# Patient Record
Sex: Female | Born: 1965
Health system: Southern US, Community
[De-identification: ages and names within clinical notes are randomized; demographics above are authoritative.]

## PROBLEM LIST (undated history)

## (undated) ENCOUNTER — Emergency Department (HOSPITAL_COMMUNITY): Admission: EM | Payer: 59

## (undated) DIAGNOSIS — F419 Anxiety disorder, unspecified: Secondary | ICD-10-CM

## (undated) DIAGNOSIS — F329 Major depressive disorder, single episode, unspecified: Secondary | ICD-10-CM

## (undated) DIAGNOSIS — C787 Secondary malignant neoplasm of liver and intrahepatic bile duct: Secondary | ICD-10-CM

## (undated) DIAGNOSIS — E890 Postprocedural hypothyroidism: Secondary | ICD-10-CM

## (undated) DIAGNOSIS — N393 Stress incontinence (female) (male): Secondary | ICD-10-CM

## (undated) DIAGNOSIS — R918 Other nonspecific abnormal finding of lung field: Secondary | ICD-10-CM

## (undated) DIAGNOSIS — R112 Nausea with vomiting, unspecified: Secondary | ICD-10-CM

## (undated) DIAGNOSIS — G8929 Other chronic pain: Secondary | ICD-10-CM

## (undated) DIAGNOSIS — Z8639 Personal history of other endocrine, nutritional and metabolic disease: Secondary | ICD-10-CM

## (undated) DIAGNOSIS — C50211 Malignant neoplasm of upper-inner quadrant of right female breast: Secondary | ICD-10-CM

## (undated) DIAGNOSIS — R519 Headache, unspecified: Secondary | ICD-10-CM

## (undated) DIAGNOSIS — Z972 Presence of dental prosthetic device (complete) (partial): Secondary | ICD-10-CM

## (undated) DIAGNOSIS — Z923 Personal history of irradiation: Secondary | ICD-10-CM

## (undated) DIAGNOSIS — K5900 Constipation, unspecified: Secondary | ICD-10-CM

## (undated) DIAGNOSIS — E785 Hyperlipidemia, unspecified: Secondary | ICD-10-CM

## (undated) DIAGNOSIS — C7951 Secondary malignant neoplasm of bone: Secondary | ICD-10-CM

## (undated) DIAGNOSIS — M549 Dorsalgia, unspecified: Secondary | ICD-10-CM

## (undated) DIAGNOSIS — R51 Headache: Secondary | ICD-10-CM

## (undated) DIAGNOSIS — R011 Cardiac murmur, unspecified: Secondary | ICD-10-CM

## (undated) DIAGNOSIS — N920 Excessive and frequent menstruation with regular cycle: Secondary | ICD-10-CM

## (undated) DIAGNOSIS — I10 Essential (primary) hypertension: Secondary | ICD-10-CM

## (undated) DIAGNOSIS — M1990 Unspecified osteoarthritis, unspecified site: Secondary | ICD-10-CM

## (undated) DIAGNOSIS — F32A Depression, unspecified: Secondary | ICD-10-CM

## (undated) DIAGNOSIS — Z8489 Family history of other specified conditions: Secondary | ICD-10-CM

## (undated) DIAGNOSIS — Z973 Presence of spectacles and contact lenses: Secondary | ICD-10-CM

## (undated) DIAGNOSIS — Z9889 Other specified postprocedural states: Secondary | ICD-10-CM

## (undated) HISTORY — PX: COLONOSCOPY: SHX174

## (undated) HISTORY — PX: MASTECTOMY: SHX3

## (undated) HISTORY — DX: Cardiac murmur, unspecified: R01.1

## (undated) HISTORY — DX: Essential (primary) hypertension: I10

## (undated) HISTORY — PX: OTHER SURGICAL HISTORY: SHX169

## (undated) HISTORY — DX: Anxiety disorder, unspecified: F41.9

## (undated) HISTORY — DX: Malignant neoplasm of upper-inner quadrant of right female breast: C50.211

## (undated) HISTORY — DX: Excessive and frequent menstruation with regular cycle: N92.0

---

## 1993-05-11 HISTORY — PX: TUBAL LIGATION: SHX77

## 1998-08-28 ENCOUNTER — Other Ambulatory Visit: Admission: RE | Admit: 1998-08-28 | Discharge: 1998-08-28 | Payer: Self-pay | Admitting: Obstetrics & Gynecology

## 1999-09-22 ENCOUNTER — Other Ambulatory Visit: Admission: RE | Admit: 1999-09-22 | Discharge: 1999-09-22 | Payer: Self-pay | Admitting: Obstetrics and Gynecology

## 2000-08-27 ENCOUNTER — Other Ambulatory Visit: Admission: RE | Admit: 2000-08-27 | Discharge: 2000-08-27 | Payer: Self-pay | Admitting: Obstetrics and Gynecology

## 2002-01-20 ENCOUNTER — Other Ambulatory Visit: Admission: RE | Admit: 2002-01-20 | Discharge: 2002-01-20 | Payer: Self-pay | Admitting: Obstetrics and Gynecology

## 2002-02-21 ENCOUNTER — Ambulatory Visit (HOSPITAL_COMMUNITY): Admission: RE | Admit: 2002-02-21 | Discharge: 2002-02-21 | Payer: Self-pay | Admitting: Family Medicine

## 2002-02-21 ENCOUNTER — Encounter: Payer: Self-pay | Admitting: Family Medicine

## 2002-03-03 ENCOUNTER — Encounter: Admission: RE | Admit: 2002-03-03 | Discharge: 2002-03-03 | Payer: Self-pay | Admitting: Family Medicine

## 2002-03-03 ENCOUNTER — Encounter: Payer: Self-pay | Admitting: Family Medicine

## 2002-07-25 ENCOUNTER — Encounter: Payer: Self-pay | Admitting: Endocrinology

## 2002-07-25 ENCOUNTER — Ambulatory Visit (HOSPITAL_COMMUNITY): Admission: RE | Admit: 2002-07-25 | Discharge: 2002-07-25 | Payer: Self-pay | Admitting: Endocrinology

## 2002-08-17 ENCOUNTER — Encounter: Payer: Self-pay | Admitting: Endocrinology

## 2002-08-17 ENCOUNTER — Ambulatory Visit (HOSPITAL_COMMUNITY): Admission: RE | Admit: 2002-08-17 | Discharge: 2002-08-17 | Payer: Self-pay | Admitting: Endocrinology

## 2003-02-21 ENCOUNTER — Other Ambulatory Visit: Admission: RE | Admit: 2003-02-21 | Discharge: 2003-02-21 | Payer: Self-pay | Admitting: Obstetrics and Gynecology

## 2004-04-01 ENCOUNTER — Other Ambulatory Visit: Admission: RE | Admit: 2004-04-01 | Discharge: 2004-04-01 | Payer: Self-pay | Admitting: Obstetrics and Gynecology

## 2004-04-25 ENCOUNTER — Ambulatory Visit (HOSPITAL_COMMUNITY): Admission: RE | Admit: 2004-04-25 | Discharge: 2004-04-25 | Payer: Self-pay | Admitting: Obstetrics and Gynecology

## 2004-07-16 ENCOUNTER — Observation Stay (HOSPITAL_COMMUNITY): Admission: EM | Admit: 2004-07-16 | Discharge: 2004-07-17 | Payer: Self-pay | Admitting: Family Medicine

## 2005-04-08 ENCOUNTER — Other Ambulatory Visit: Admission: RE | Admit: 2005-04-08 | Discharge: 2005-04-08 | Payer: Self-pay | Admitting: Obstetrics and Gynecology

## 2005-04-29 ENCOUNTER — Ambulatory Visit (HOSPITAL_COMMUNITY): Admission: RE | Admit: 2005-04-29 | Discharge: 2005-04-29 | Payer: Self-pay | Admitting: Obstetrics and Gynecology

## 2006-04-30 ENCOUNTER — Ambulatory Visit (HOSPITAL_COMMUNITY): Admission: RE | Admit: 2006-04-30 | Discharge: 2006-04-30 | Payer: Self-pay | Admitting: Obstetrics and Gynecology

## 2006-08-14 ENCOUNTER — Emergency Department (HOSPITAL_COMMUNITY): Admission: EM | Admit: 2006-08-14 | Discharge: 2006-08-14 | Payer: Self-pay | Admitting: Emergency Medicine

## 2007-05-03 ENCOUNTER — Ambulatory Visit (HOSPITAL_COMMUNITY): Admission: RE | Admit: 2007-05-03 | Discharge: 2007-05-03 | Payer: Self-pay | Admitting: Obstetrics and Gynecology

## 2007-05-18 ENCOUNTER — Encounter: Admission: RE | Admit: 2007-05-18 | Discharge: 2007-05-18 | Payer: Self-pay | Admitting: Obstetrics and Gynecology

## 2008-05-08 ENCOUNTER — Ambulatory Visit (HOSPITAL_COMMUNITY): Admission: RE | Admit: 2008-05-08 | Discharge: 2008-05-08 | Payer: Self-pay | Admitting: Obstetrics and Gynecology

## 2009-05-09 ENCOUNTER — Ambulatory Visit (HOSPITAL_COMMUNITY): Admission: RE | Admit: 2009-05-09 | Discharge: 2009-05-09 | Payer: Self-pay | Admitting: Obstetrics and Gynecology

## 2009-05-11 DIAGNOSIS — N920 Excessive and frequent menstruation with regular cycle: Secondary | ICD-10-CM

## 2009-05-11 HISTORY — DX: Excessive and frequent menstruation with regular cycle: N92.0

## 2010-05-14 ENCOUNTER — Ambulatory Visit (HOSPITAL_COMMUNITY)
Admission: RE | Admit: 2010-05-14 | Discharge: 2010-05-14 | Payer: Self-pay | Source: Home / Self Care | Attending: Obstetrics and Gynecology | Admitting: Obstetrics and Gynecology

## 2010-09-26 NOTE — Discharge Summary (Signed)
Debbie [name], Debbie [name]           ACCOUNT NO.:  [id number]   MEDICAL RECORD NO.:  [id number]          PATIENT TYPE:  INP   LOCATION:  6743                         FACILITY:  [facility]   PHYSICIAN:  Corinna L. Lendell Caprice, MDDATE OF BIRTH:  29-May-1965   DATE OF ADMISSION:  07/16/2004  DATE OF DISCHARGE:  07/17/2004                                 DISCHARGE SUMMARY   DIAGNOSES:  1.  Palpitations. Suspect due to Synthroid. Recommend decreasing the dose,      but will have the patient follow up with Dr. Talmage Nap.  2.  History of thyroid radioablation.   DISCHARGE MEDICATIONS:  She is to continue her current Synthroid dose and  follow up with Dr. Talmage Nap to consider decreasing.   ACTIVITY:  She may return to work in two days.   CONDITION:  Stable.   DIET:  Regular.   CONSULTATIONS:  None.   PERTINENT LABORATORY DATA:  7.362 pH, pCO2 47. H&H, BMET normal. Two point  of care enzymes normal. TSH 5.088.   SPECIAL STUDIES AND RADIOLOGY:  EKG showed normal sinus rhythm with a rate  of 90. Chest x-ray negative.   HISTORY AND HOSPITAL COURSE:  Debbie [name] is a pleasant 45 year old black  female who is on Synthroid after having radioablation of her thyroid two  years ago. She reports that her Synthroid dose had been too high and it was  decreased about a month ago. She presented with palpitations, severe  anxiety, diaphoresis. She felt similar to previous episodes in the past when  she was on too high a dose of Synthroid; however, this was a bit worse. She  had this sensation while on the monitor several times but remained in normal  sinus rhythm. Despite her relatively high  TSH, her symptoms are consistent with a hyperthyroid state. I recommend  decreasing this thyroid dose but patient wishes to follow up with Dr. Talmage Nap  rather than making a change. I will have a call in to Dr. Talmage Nap and I  encourage patient to follow up with her as soon as possible.      CLS/MEDQ  D:  07/17/2004  T:   07/17/2004  Job:  [id number]   cc:   Gretta Arab. Valentina Lucks, M.D.  [address] [place]  [place] [address]  Fax: [phone]   Dorisann Frames, M.D.  [phone], [place] [address]  Fax: [phone]

## 2010-09-26 NOTE — H&P (Signed)
Debbie [name], Debbie [name]           ACCOUNT NO.:  [id number]   MEDICAL RECORD NO.:  [id number]          PATIENT TYPE:  EMS   LOCATION:  MAJO                         FACILITY:  [facility]   PHYSICIAN:  Hollice Espy, M.D.DATE OF BIRTH:  Dec 06, 1965   DATE OF ADMISSION:  07/16/2004  DATE OF DISCHARGE:                                HISTORY & PHYSICAL   PRIMARY CARE PHYSICIAN:  Gretta Arab. Valentina Lucks, M.D.   CHIEF COMPLAINT:  Palpitations.   HISTORY OF PRESENT ILLNESS:  The patient is a 45 year old African-American  female with a past medical history of hypothyroidism diagnosed and started  on medication approximately 2 months ago, as well as dyslipidemia felt to be  secondary to the thyroid, who presents with an episode of chest discomfort.  In discussion with the patient, it is actually described as sudden  palpitations in her chest.  The patient began to feel this fluttery  sensation in her chest earlier on in the evening.  It continued to the point  where she began to feel very lightheaded.  She felt as if she was going to  pass out.  Her husband became concerned and brought the patient to the  emergency room.  There, the patient was noted to be sinus tachycardic with  heart rate only as high as 110.  An EKG was done, which actually showed  normal sinus rhythm, some minimal right atrial enlargement, and a heart rate  of 84 to 91.  Lab work was unremarkable.  Cardiac enzymes, first set, were  negative.  A chest x-ray was done, which was also normal.  The patient,  herself, said she was short of breath, but she felt that maybe this was  secondary to the anxiety caused by the feeling of quick heart beat.  She  denied any chest pain or chest pressure or previous shortness of breath  prior to coming in.  Currently, the patient says she is doing well.  She  denies any headaches, visual changes, dysphagia.  She denies any current  chest pain, chest pressure, wheezing, coughing, abdominal pain,  hematuria,  dysuria, constipation, diarrhea.  Review of systems is otherwise negative.   PAST MEDICAL HISTORY:  1.  Hypothyroidism.  2.  Seasonal rhinitis.  3.  History of dyslipidemia.   MEDICATIONS:  1.  Synthroid 88 mcg p.o. daily.  2.  Allegra p.r.n., which she has not taken for a while.   ALLERGIES:  No known drug allergies.   SOCIAL HISTORY:  She denies any tobacco, alcohol, or drug use.   FAMILY HISTORY:  Notable for mom with congestive heart failure and MI.   PHYSICAL EXAMINATION:  VITAL SIGNS:  The patient's vitals on admission  revealed a heart rate of 102 (it is sinus), respirations 16.  She is  afebrile at 97.2.  Blood pressure 134/91.  GENERAL:  The patient is alert and oriented x3, in no apparent distress.  HEENT:  Normocephalic and atraumatic.  Mucous membranes are moist.  She has  no carotid bruits.  HEART:  Regular rhythm.  Mild tachycardia.  LUNGS:  Clear to auscultation bilaterally.  ABDOMEN:  Soft,  nontender, nondistended.  Positive bowel sounds.  EXTREMITIES:  No clubbing, cyanosis, or edema.   LABORATORY WORK:  Sodium 138, potassium 4.2, chloride 106, bicarb 27, BUN 6,  glucose 106.  Hemoglobin and hematocrit of 14.6 and 43.  Creatinine 0.9.  CPK 62.5, MB less than 1, troponin I less than 0.05.   ASSESSMENT AND PLAN:  1.  Palpitations.  I do not feel that this is chest pressure or discomfort      in the sense of a rule out myocardial infarction.  Will plan to observe      this patient and check a few more sets of cardiac enzymes.  Will go      ahead and check a TSH, as well, as I suspect this may be the issue.      Will give her 2 liters of IV fluids, in the morning to recheck      orthostatics and an EKG, as well as one more set of cardiac enzymes in      the morning.  If she is well, she may be able to go home, and at her      discretion, as well as Dr. Jone Baseman, she may want to get an outpatient      stress test.  If her heart rate remains sinus  tachycardic, and her      thyroid level is elevated, would recommend increasing her thyroid dose      perhaps, and starting her on a beta blocker.      SKK/MEDQ  D:  07/16/2004  T:  07/17/2004  Job:  [id number]   cc:   Gretta Arab. Valentina Lucks, M.D.  [address] [place]  [place] [address]  Fax: [phone]

## 2011-04-21 ENCOUNTER — Other Ambulatory Visit (HOSPITAL_COMMUNITY): Payer: Self-pay | Admitting: Obstetrics and Gynecology

## 2011-04-21 DIAGNOSIS — Z1231 Encounter for screening mammogram for malignant neoplasm of breast: Secondary | ICD-10-CM

## 2011-05-26 ENCOUNTER — Ambulatory Visit (HOSPITAL_COMMUNITY)
Admission: RE | Admit: 2011-05-26 | Discharge: 2011-05-26 | Disposition: A | Discharge status: Home / Self Care | Payer: 59 | Source: Ambulatory Visit | Attending: Obstetrics and Gynecology | Admitting: Obstetrics and Gynecology

## 2011-05-26 DIAGNOSIS — Z1231 Encounter for screening mammogram for malignant neoplasm of breast: Secondary | ICD-10-CM | POA: Insufficient documentation

## 2011-06-03 ENCOUNTER — Other Ambulatory Visit: Payer: Self-pay | Admitting: Obstetrics and Gynecology

## 2011-06-03 DIAGNOSIS — R928 Other abnormal and inconclusive findings on diagnostic imaging of breast: Secondary | ICD-10-CM

## 2011-06-10 ENCOUNTER — Ambulatory Visit
Admission: RE | Admit: 2011-06-10 | Discharge: 2011-06-10 | Disposition: A | Discharge status: Home / Self Care | Payer: 59 | Source: Ambulatory Visit | Attending: Obstetrics and Gynecology | Admitting: Obstetrics and Gynecology

## 2011-06-10 DIAGNOSIS — R928 Other abnormal and inconclusive findings on diagnostic imaging of breast: Secondary | ICD-10-CM

## 2011-12-28 ENCOUNTER — Encounter: Payer: 59 | Attending: Family Medicine | Admitting: *Deleted

## 2011-12-28 ENCOUNTER — Encounter: Payer: Self-pay | Admitting: *Deleted

## 2011-12-28 VITALS — Ht 66.0 in | Wt 233.6 lb

## 2011-12-28 DIAGNOSIS — E669 Obesity, unspecified: Secondary | ICD-10-CM | POA: Insufficient documentation

## 2011-12-28 DIAGNOSIS — Z713 Dietary counseling and surveillance: Secondary | ICD-10-CM | POA: Insufficient documentation

## 2011-12-28 NOTE — Patient Instructions (Signed)
Goals:  1. 1500 calories per day (450 calories per meal) for 1 pound weight loss per week.  2. Eat 3 regular meals daily.  3. Reduce regular soda. Substitute diet at least 3 days weekly.  4. Work up to walking for 30 minutes at least 3 days per week.

## 2011-12-28 NOTE — Progress Notes (Signed)
Medical Nutrition Therapy:  Appt start time: 0915 end time:  1015.  Assessment:  Primary concerns today: Weight management. Patient reports she desires weight loss. She has been successful in the past, and is interested in joining [employer], but wants some extra guidance first. 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 Her job is sedentary, but she tries to walk the stairs when she can. Intake is inconsistent, with 3 meals on days she works, and 1-2 meals on days she is home. She reports drinking 1 20 ounce bottle of regular soda daily.   WEIGHT: 233.6 pounds BMI: 37.8  MEDICATIONS: Levothyroxine, calcium plus D3, fish oil, MVI, Klor-Con   DIETARY INTAKE:   Usual eating pattern includes 1-3 meals and 2-3 snacks per day.  24-hr recall:  B ( AM): Sat/Sun, Omelet with bacon, cheese, onions, pepper, small pancake, Mountain Dew (20 ounce), or fried egg, toast, or grits with 2 smart balance butter and bacon, water or Kool-Aid  Snk ( AM): None  L ( PM): Salad with spinach, sunflowers, eggs, ranch dressing, honeydew, Home: [employer] caesar salad Snk ( PM): cappuccino 12 ounces, fruit D ( PM): Salad with chicken nuggets, diet coke Snk ( PM): lemon drops, fruit cups Beverages: Mountain Dew/Coke, water, Kool-aid  Usual physical activity: Stairs at work  Estimated energy needs: 1500 calories 188 g carbohydrates 94 g protein 42 g fat  Progress Towards Goal(s):  In progress.   Nutritional Diagnosis:  [place]-3.3 Overweight/obesity As related to excessive energy intake and physical inactivity.  As evidenced by BMI 37.8.    Intervention:  Nutrition counseling. We discussed strategies for weight loss including eating regular meals, portion control, and choosing lower calorie, nutrient dense foods.   Goals:  1. 1500 calories per day (450 calories per meal) for 1 pound weight loss per week.  2. Eat 3 regular meals daily.  3. Reduce regular soda. Substitute diet at least 3 days weekly.  4. Work  up to walking for 30 minutes at least 3 days per week.   Handouts given during visit include:  5 day, 1500 kcal meal plan  Weight loss tips  Yellow portion card  Monitoring/Evaluation:  Dietary intake, exercise, and body weight in 1 month(s).

## 2012-02-08 ENCOUNTER — Encounter: Payer: 59 | Attending: Family Medicine | Admitting: *Deleted

## 2012-02-08 ENCOUNTER — Encounter: Payer: Self-pay | Admitting: *Deleted

## 2012-02-08 VITALS — Ht 66.0 in | Wt 229.9 lb

## 2012-02-08 DIAGNOSIS — Z713 Dietary counseling and surveillance: Secondary | ICD-10-CM | POA: Insufficient documentation

## 2012-02-08 DIAGNOSIS — E669 Obesity, unspecified: Secondary | ICD-10-CM | POA: Insufficient documentation

## 2012-02-08 NOTE — Progress Notes (Signed)
Medical Nutrition Therapy:  Appt start time: 0900 end time:  0930.  Assessment: Patient has been successful with weight loss. She reports that she is eating smaller portions, has increased fruit and vegetable intake, is making healthier choices (i.e. Salad vs. Donzetta Sprung), and has cut regular soda back to 2 days weekly, choosing water or Crystal Light instead. Her weight is down to 229.9 pounds from 233.6 pounds, which is a 3.7 pound weight loss over 6 weeks.   MEDICATIONS: Synthroid, calcium plus D3, fish oil, MVI, Klor-Con   DIETARY INTAKE:   24-hr recall:  B ( AM): Week, Fruit, small salad, boiled egg and toast, weekends: bacon, omelet/pancakes, soda or Crystal Light  Snk ( AM): None  L ( PM): Small salad with chicken nuggets, baked chicken, mac and cheese, green beans/mixed vegetables/salad Snk ( PM): None D ( PM): Salad, or cereal Snk ( PM): Fruit (apple, grapes) Beverages: Water, Crystal Light, diet soda/regular soda  Usual physical activity: Walking more, at mall, walks all levels, taking stairs, parking further from building.   Progress Towards Goal(s):  In progress.   Nutritional Diagnosis:  [place]-3.3 Overweight/obesity As related to excessive energy intake and physical inactivity. As evidenced by BMI 37.2.    Intervention:  Nutrition counseling. I reinforced strategies for weight loss with patient, including continuing to monitor portion sizes and choosing fruit/vegetables over unhealthier options  Goals:  1. 1500 calories per day (450 calories per meal) for 1 pound weight loss per week.  2. Eat 3 regular meals daily.  3. Continue to limit soda to 2 bottles weekly. 4. Work up to walking for 30 minutes at least 3 days per week.   Handouts given during visit include:  None  Monitoring/Evaluation:  Dietary intake, exercise, and body weight prn.

## 2012-04-12 ENCOUNTER — Ambulatory Visit: Payer: 59 | Admitting: Obstetrics and Gynecology

## 2012-04-19 ENCOUNTER — Encounter: Payer: Self-pay | Admitting: Obstetrics and Gynecology

## 2012-04-19 ENCOUNTER — Ambulatory Visit (INDEPENDENT_AMBULATORY_CARE_PROVIDER_SITE_OTHER): Payer: 59 | Admitting: Obstetrics and Gynecology

## 2012-04-19 VITALS — BP 128/82 | HR 80 | Ht 65.75 in | Wt 231.0 lb

## 2012-04-19 DIAGNOSIS — D219 Benign neoplasm of connective and other soft tissue, unspecified: Secondary | ICD-10-CM | POA: Insufficient documentation

## 2012-04-19 DIAGNOSIS — N393 Stress incontinence (female) (male): Secondary | ICD-10-CM | POA: Insufficient documentation

## 2012-04-19 DIAGNOSIS — Z124 Encounter for screening for malignant neoplasm of cervix: Secondary | ICD-10-CM

## 2012-04-19 DIAGNOSIS — D259 Leiomyoma of uterus, unspecified: Secondary | ICD-10-CM

## 2012-04-19 DIAGNOSIS — E039 Hypothyroidism, unspecified: Secondary | ICD-10-CM | POA: Insufficient documentation

## 2012-04-19 DIAGNOSIS — I10 Essential (primary) hypertension: Secondary | ICD-10-CM

## 2012-04-19 NOTE — Progress Notes (Signed)
Subjective:  Last Pap: 04/30/09, due this year per last note WNL: Yes Regular Periods:no Contraception: BTL  Monthly Breast exam:yes Tetanus<65yrs:no pt unsure  Nl.Bladder Function:yes Daily BMs:yes Healthy Diet:no Calcium:yes Mammogram:yes Date of Mammogram: 05/2011 Exercise:no Have often Exercise: n/a Seatbelt: yes Abuse at home: no Stressful work:yes Sigmoid-colonoscopy: n/a Bone Density: No PCP: Dr. Maurice Small Change in PMH: none Change in EXB:MWUX  Debbie [name] is a 46 y.o. female, G5P1, who presents for an annual exam.     History   Social History  . Marital Status: Married    Spouse Name: N/A    Number of Children: N/A  . Years of Education: N/A   Social History Main Topics  . Smoking status: Never Smoker   . Smokeless tobacco: None  . Alcohol Use: No  . Drug Use: No  . Sexually Active: None     Comment: BTL    Other Topics Concern  . None   Social History Narrative  . None    Menstrual cycle:   LMP: Patient's last menstrual period was 04/13/2012.           Cycle:Erratic for the last year, but always at least 21 days apart and no IM bleeding.  Some low back pain with onset of bleeding, Advil helps  The following portions of the patient's history were reviewed and updated as appropriate: allergies, current medications, past family history, past medical history, past social history, past surgical history and problem list.  Review of Systems Pertinent items are noted in HPI. Breast:Negative for breast lump,nipple discharge or nipple retraction Gastrointestinal: Negative for abdominal pain, change in bowel habits or rectal bleeding Urinary:negative   Objective:    BP 128/82  Pulse 80  Ht 5' 5.75" (1.67 m)  Wt 231 lb (104.781 kg)  BMI 37.57 kg/m2  LMP 04/13/2012    Weight:  Wt Readings from Last 1 Encounters:  04/19/12 231 lb (104.781 kg)          BMI: Body mass index is 37.57 kg/(m^2).  General Appearance: Alert, appropriate  appearance for age. No acute distress HEENT: Grossly normal Neck / Thyroid: Supple, no masses, nodes or enlargement Lungs: clear to auscultation bilaterally Back: No CVA tenderness Breast Exam: No masses or nodes.No dimpling, nipple retraction or discharge. Cardiovascular: Regular rate and rhythm. S1, S2, no murmur Gastrointestinal: Soft, non-tender, no masses or organomegaly Pelvic Exam: External genitalia: abnormal pigmentation Vaginal: normal rugae Cervix: normal appearance Adnexa: no masses Uterus: irregular enlargement Exam limited by body habitus Rectovaginal: normal rectal, no masses Lymphatic Exam: Non-palpable nodes in neck, clavicular, axillary, or inguinal regions Skin: no rash or abnormalities Neurologic: Normal gait and speech, no tremor  Psychiatric: Alert and oriented, appropriate affect.   Wet Prep:not applicable Urinalysis:not applicable UPT: Not done   Assessment:    Asymptomatic and stable fibroids  Perimenopausal bleeding pattern Stress urinary incontinence, pt declines surgical rx  Stable hypertension and hypothroidism Plan:    mammogram pap smear with HR HPV return annually or prn STD screening: declined Contraception:bilateral tubal ligation   Dierdre Forth MD

## 2012-04-21 LAB — PAP IG AND HPV HIGH-RISK: HPV DNA High Risk: NOT DETECTED

## 2012-04-27 ENCOUNTER — Other Ambulatory Visit: Payer: Self-pay | Admitting: Obstetrics and Gynecology

## 2012-04-27 DIAGNOSIS — Z1231 Encounter for screening mammogram for malignant neoplasm of breast: Secondary | ICD-10-CM

## 2012-06-10 ENCOUNTER — Ambulatory Visit (HOSPITAL_COMMUNITY)
Admission: RE | Admit: 2012-06-10 | Discharge: 2012-06-10 | Disposition: A | Discharge status: Home / Self Care | Payer: 59 | Source: Ambulatory Visit | Attending: Obstetrics and Gynecology | Admitting: Obstetrics and Gynecology

## 2012-06-10 DIAGNOSIS — Z1231 Encounter for screening mammogram for malignant neoplasm of breast: Secondary | ICD-10-CM | POA: Insufficient documentation

## 2012-06-14 ENCOUNTER — Other Ambulatory Visit: Payer: Self-pay | Admitting: Obstetrics and Gynecology

## 2012-06-14 DIAGNOSIS — R928 Other abnormal and inconclusive findings on diagnostic imaging of breast: Secondary | ICD-10-CM

## 2012-06-24 ENCOUNTER — Other Ambulatory Visit: Payer: 59

## 2012-06-25 ENCOUNTER — Other Ambulatory Visit: Payer: Self-pay

## 2012-07-07 ENCOUNTER — Ambulatory Visit
Admission: RE | Admit: 2012-07-07 | Discharge: 2012-07-07 | Disposition: A | Discharge status: Home / Self Care | Payer: 59 | Source: Ambulatory Visit | Attending: Obstetrics and Gynecology | Admitting: Obstetrics and Gynecology

## 2012-07-07 DIAGNOSIS — R928 Other abnormal and inconclusive findings on diagnostic imaging of breast: Secondary | ICD-10-CM

## 2012-12-19 ENCOUNTER — Other Ambulatory Visit: Payer: Self-pay | Admitting: Obstetrics and Gynecology

## 2012-12-19 DIAGNOSIS — N63 Unspecified lump in unspecified breast: Secondary | ICD-10-CM

## 2012-12-29 ENCOUNTER — Ambulatory Visit
Admission: RE | Admit: 2012-12-29 | Discharge: 2012-12-29 | Disposition: A | Discharge status: Home / Self Care | Payer: 59 | Source: Ambulatory Visit | Attending: Obstetrics and Gynecology | Admitting: Obstetrics and Gynecology

## 2012-12-29 ENCOUNTER — Other Ambulatory Visit: Payer: Self-pay | Admitting: Obstetrics and Gynecology

## 2012-12-29 DIAGNOSIS — N63 Unspecified lump in unspecified breast: Secondary | ICD-10-CM

## 2013-03-16 ENCOUNTER — Other Ambulatory Visit: Payer: Self-pay

## 2013-06-21 ENCOUNTER — Other Ambulatory Visit: Payer: Self-pay | Admitting: Obstetrics and Gynecology

## 2013-06-21 DIAGNOSIS — N649 Disorder of breast, unspecified: Secondary | ICD-10-CM

## 2013-07-04 ENCOUNTER — Other Ambulatory Visit: Payer: 59

## 2013-07-18 ENCOUNTER — Ambulatory Visit
Admission: RE | Admit: 2013-07-18 | Discharge: 2013-07-18 | Disposition: A | Discharge status: Home / Self Care | Payer: 59 | Source: Ambulatory Visit | Attending: Obstetrics and Gynecology | Admitting: Obstetrics and Gynecology

## 2013-07-18 DIAGNOSIS — N649 Disorder of breast, unspecified: Secondary | ICD-10-CM

## 2014-03-12 ENCOUNTER — Encounter: Payer: Self-pay | Admitting: Obstetrics and Gynecology

## 2014-05-07 ENCOUNTER — Encounter: Payer: Self-pay | Admitting: Internal Medicine

## 2014-05-15 ENCOUNTER — Ambulatory Visit (AMBULATORY_SURGERY_CENTER): Payer: Self-pay | Admitting: *Deleted

## 2014-05-15 VITALS — Ht 66.0 in | Wt 234.0 lb

## 2014-05-15 DIAGNOSIS — Z1211 Encounter for screening for malignant neoplasm of colon: Secondary | ICD-10-CM

## 2014-05-15 MED ORDER — MOVIPREP 100 G PO SOLR: ORAL | Status: DC

## 2014-05-15 NOTE — Progress Notes (Signed)
No egg or soy allergy  No anesthesia or intubation problems per pt  No diet medications taken  Registered in EMMI   

## 2014-05-23 ENCOUNTER — Ambulatory Visit (AMBULATORY_SURGERY_CENTER): Payer: 59 | Admitting: Internal Medicine

## 2014-05-23 ENCOUNTER — Encounter: Payer: Self-pay | Admitting: Internal Medicine

## 2014-05-23 VITALS — BP 117/91 | HR 76 | Temp 97.9°F | Resp 20 | Ht 66.0 in | Wt 234.0 lb

## 2014-05-23 DIAGNOSIS — Z1211 Encounter for screening for malignant neoplasm of colon: Secondary | ICD-10-CM

## 2014-05-23 MED ORDER — SODIUM CHLORIDE 0.9 % IV SOLN
500.0000 mL | INTRAVENOUS | Status: DC
Start: 2014-05-23 — End: 2014-05-23

## 2014-05-23 NOTE — Op Note (Signed)
[facility]  [address]. [place], [address]   COLONOSCOPY PROCEDURE REPORT  PATIENT: Debbie, [name]  MR#: [id number] BIRTHDATE: 1966-04-14 , 48  yrs. old GENDER: female ENDOSCOPIST: Jerene Bears, MD REFERRED YP:PJKDTO Laurann Montana, M.D. PROCEDURE DATE:  05/23/2014 PROCEDURE:   Colonoscopy, screening First Screening Colonoscopy - Avg.  risk and is 50 yrs.  old or older Yes.  Prior Negative Screening - Now for repeat screening. N/A  History of Adenoma - Now for follow-up colonoscopy & has been > or = to 3 yrs.  N/A  Polyps Removed Today? No.  Polyps Removed Today? No.  Recommend repeat exam, <10 yrs? Polyps Removed Today? No.  Recommend repeat exam, <10 yrs? No. ASA CLASS:   Class II INDICATIONS:average risk for colorectal cancer and first colonoscopy. MEDICATIONS: Monitored anesthesia care and Propofol 250 mg IV  DESCRIPTION OF PROCEDURE:   After the risks benefits and alternatives of the procedure were thoroughly explained, informed consent was obtained.  The digital rectal exam revealed no rectal mass.   The LB [id number]  endoscope was introduced through the anus and advanced to the cecum, which was identified by both the appendix and ileocecal valve. No adverse events experienced. The quality of the prep was good, using MoviPrep  The instrument was then slowly withdrawn as the colon was fully examined.   COLON FINDINGS: There was mild diverticulosis noted in the sigmoid colon.   The examination was otherwise normal.  Retroflexed views revealed internal hemorrhoids. The time to cecum=2 minutes 13 seconds.  Withdrawal time=9 minutes 13 seconds.  The scope was withdrawn and the procedure completed.  COMPLICATIONS: There were no immediate complications.  ENDOSCOPIC IMPRESSION: 1.   Mild diverticulosis was noted in the sigmoid colon 2.   The examination was otherwise normal  RECOMMENDATIONS: 1.  High fiber diet 2.  You should continue to follow  colorectal cancer screening guidelines for "routine risk" patients with a repeat colonoscopy in 10 years.  There is no need for FOBT (stool) testing for at least 5 years.  eSigned:  Jerene Bears, MD 05/23/2014 12:08 PM   cc: Kelton Pillar, MD and The Patient

## 2014-05-23 NOTE — Patient Instructions (Signed)
YOU HAD AN ENDOSCOPIC PROCEDURE TODAY AT THE [facility]: Refer to the procedure report that was given to you for any specific questions about what was found during the examination.  If the procedure report does not answer your questions, please call your gastroenterologist to clarify.  If you requested that your care partner not be given the details of your procedure findings, then the procedure report has been included in a sealed envelope for you to review at your convenience later.  YOU SHOULD EXPECT: Some feelings of bloating in the abdomen. Passage of more gas than usual.  Walking can help get rid of the air that was put into your GI tract during the procedure and reduce the bloating. If you had a lower endoscopy (such as a colonoscopy or flexible sigmoidoscopy) you may notice spotting of blood in your stool or on the toilet paper. If you underwent a bowel prep for your procedure, then you may not have a normal bowel movement for a few days.  DIET: Your first meal following the procedure should be a light meal and then it is ok to progress to your normal diet.  A half-sandwich or bowl of soup is an example of a good first meal.  Heavy or fried foods are harder to digest and may make you feel nauseous or bloated.  Likewise meals heavy in dairy and vegetables can cause extra gas to form and this can also increase the bloating.  Drink plenty of fluids but you should avoid alcoholic beverages for 24 hours.  ACTIVITY: Your care partner should take you home directly after the procedure.  You should plan to take it easy, moving slowly for the rest of the day.  You can resume normal activity the day after the procedure however you should NOT DRIVE or use heavy machinery for 24 hours (because of the sedation medicines used during the test).    SYMPTOMS TO REPORT IMMEDIATELY: A gastroenterologist can be reached at any hour.  During normal business hours, 8:30 AM to 5:00 PM Monday through Friday,  call [phone].  After hours and on weekends, please call the GI answering service at [phone] who will take a message and have the physician on call contact you.   Following lower endoscopy (colonoscopy or flexible sigmoidoscopy):  Excessive amounts of blood in the stool  Significant tenderness or worsening of abdominal pains  Swelling of the abdomen that is new, acute  Fever of 100F or higher  FOLLOW UP: If any biopsies were taken you will be contacted by phone or by letter within the next 1-3 weeks.  Call your gastroenterologist if you have not heard about the biopsies in 3 weeks.  Our staff will call the home number listed on your records the next business day following your procedure to check on you and address any questions or concerns that you may have at that time regarding the information given to you following your procedure. This is a courtesy call and so if there is no answer at the home number and we have not heard from you through the emergency physician on call, we will assume that you have returned to your regular daily activities without incident.  SIGNATURES/CONFIDENTIALITY: You and/or your care partner have signed paperwork which will be entered into your electronic medical record.  These signatures attest to the fact that that the information above on your After Visit Summary has been reviewed and is understood.  Full responsibility of the confidentiality of this   discharge information lies with you and/or your care-partner.  Resume medications. Information given on diverticulosis and high fiber diet with discharge instructions. 

## 2014-05-23 NOTE — Progress Notes (Signed)
  [facility] Post-op Note  Patient: Debbie [name]  Procedure(s) Performed: colonoscopy  Patient Location: [facility] - Recovery Area  Anesthesia Type: Deep Sedation/Propofol  Level of Consciousness: awake, oriented and patient cooperative  Airway and Oxygen Therapy: Patient Spontanous Breathing  Post-op Pain: none  Post-op Assessment:  Post-op Vital signs reviewed, Patient's Cardiovascular Status Stable, Respiratory Function Stable, Patent Airway, No signs of Nausea or vomiting and Pain level controlled  Post-op Vital Signs: Reviewed and stable  Complications: No apparent anesthesia complications  Armando Lauman E 12:12 PM

## 2014-05-24 ENCOUNTER — Telehealth: Payer: Self-pay | Admitting: *Deleted

## 2014-05-24 NOTE — Telephone Encounter (Signed)
  Follow up Call-  Call back number 05/23/2014  Post procedure Call Back phone  # [phone]  Permission to leave phone message Yes     Patient questions:  Do you have a fever, pain , or abdominal swelling? No. Pain Score  0 *  Have you tolerated food without any problems? Yes.    Have you been able to return to your normal activities? Yes.    Do you have any questions about your discharge instructions: Diet   No. Medications  No. Follow up visit  No.  Do you have questions or concerns about your Care? No.  Actions: * If pain score is 4 or above: No action needed, pain <4.

## 2014-08-23 ENCOUNTER — Other Ambulatory Visit: Payer: Self-pay

## 2014-08-23 ENCOUNTER — Other Ambulatory Visit (HOSPITAL_COMMUNITY): Payer: Self-pay | Admitting: Obstetrics and Gynecology

## 2014-08-23 DIAGNOSIS — Z1231 Encounter for screening mammogram for malignant neoplasm of breast: Secondary | ICD-10-CM

## 2014-09-12 ENCOUNTER — Ambulatory Visit: Payer: 59

## 2014-09-27 ENCOUNTER — Ambulatory Visit
Admission: RE | Admit: 2014-09-27 | Discharge: 2014-09-27 | Disposition: A | Discharge status: Home / Self Care | Payer: 59 | Source: Ambulatory Visit

## 2014-09-27 ENCOUNTER — Other Ambulatory Visit: Payer: Self-pay

## 2014-09-27 DIAGNOSIS — Z1231 Encounter for screening mammogram for malignant neoplasm of breast: Secondary | ICD-10-CM

## 2015-05-14 DIAGNOSIS — J309 Allergic rhinitis, unspecified: Secondary | ICD-10-CM | POA: Diagnosis not present

## 2015-05-14 DIAGNOSIS — E7800 Pure hypercholesterolemia, unspecified: Secondary | ICD-10-CM | POA: Diagnosis not present

## 2015-05-14 DIAGNOSIS — Z1159 Encounter for screening for other viral diseases: Secondary | ICD-10-CM | POA: Diagnosis not present

## 2015-05-14 DIAGNOSIS — E039 Hypothyroidism, unspecified: Secondary | ICD-10-CM | POA: Diagnosis not present

## 2015-05-14 DIAGNOSIS — I10 Essential (primary) hypertension: Secondary | ICD-10-CM | POA: Diagnosis not present

## 2015-05-14 DIAGNOSIS — E0500 Thyrotoxicosis with diffuse goiter without thyrotoxic crisis or storm: Secondary | ICD-10-CM | POA: Diagnosis not present

## 2015-05-14 DIAGNOSIS — F39 Unspecified mood [affective] disorder: Secondary | ICD-10-CM | POA: Diagnosis not present

## 2015-05-14 DIAGNOSIS — N951 Menopausal and female climacteric states: Secondary | ICD-10-CM | POA: Diagnosis not present

## 2015-05-14 DIAGNOSIS — Z0000 Encounter for general adult medical examination without abnormal findings: Secondary | ICD-10-CM | POA: Diagnosis not present

## 2015-05-17 MED FILL — LOSARTAN POTASSIUM 100 MG T: 100 | 30 days supply | Qty: 30 | Fill #4

## 2015-05-21 DIAGNOSIS — N939 Abnormal uterine and vaginal bleeding, unspecified: Secondary | ICD-10-CM | POA: Diagnosis not present

## 2015-05-21 DIAGNOSIS — N9419 Other specified dyspareunia: Secondary | ICD-10-CM | POA: Diagnosis not present

## 2015-05-21 DIAGNOSIS — Z01411 Encounter for gynecological examination (general) (routine) with abnormal findings: Secondary | ICD-10-CM | POA: Diagnosis not present

## 2015-05-21 DIAGNOSIS — Z1231 Encounter for screening mammogram for malignant neoplasm of breast: Secondary | ICD-10-CM | POA: Diagnosis not present

## 2015-05-21 DIAGNOSIS — N951 Menopausal and female climacteric states: Secondary | ICD-10-CM | POA: Diagnosis not present

## 2015-05-21 DIAGNOSIS — Z6841 Body Mass Index (BMI) 40.0 and over, adult: Secondary | ICD-10-CM | POA: Diagnosis not present

## 2015-05-22 MED FILL — ESTRADIOL 0.05 MG PATCH: 0.05 | 28 days supply | Qty: 8 | Fill #0

## 2015-05-30 MED FILL — METFORMIN HCL ER 500 MG TAB: 500 | 30 days supply | Qty: 30 | Fill #2

## 2015-06-06 MED FILL — POTASSIUM CL ER 20 MEQ TAB: 20 | 30 days supply | Qty: 60 | Fill #0

## 2015-06-06 MED FILL — LEVOTHYROXINE 125 MCG TAB: 125 | 30 days supply | Qty: 30 | Fill #4

## 2015-06-13 MED FILL — ESTRADIOL 0.05 MG PATCH: 0.05 | 28 days supply | Qty: 8 | Fill #1

## 2015-06-24 MED FILL — LIVALO 2 MG TABLET: 2 | 30 days supply | Qty: 30 | Fill #2

## 2015-06-25 MED FILL — LOSARTAN POTASSIUM 100 MG T: 100 | 90 days supply | Qty: 90 | Fill #0

## 2015-07-08 MED FILL — LEVOTHYROXINE 125 MCG TAB: 125 | 30 days supply | Qty: 30 | Fill #5

## 2015-07-08 MED FILL — METFORMIN HCL ER 500 MG TAB: 500 | 30 days supply | Qty: 30 | Fill #3

## 2015-07-15 MED FILL — POTASSIUM CL ER 20 MEQ TAB: 20 | 30 days supply | Qty: 60 | Fill #1

## 2015-07-15 MED FILL — ESTRADIOL 0.05 MG PATCH: 0.05 | 28 days supply | Qty: 8 | Fill #2

## 2015-07-18 MED FILL — LIVALO 2 MG TABLET: 2 | 30 days supply | Qty: 30 | Fill #3

## 2015-07-18 MED FILL — HYDROCHLOROTHIAZIDE 12.5 MG: 12.5 | 90 days supply | Qty: 90 | Fill #0

## 2015-07-26 DIAGNOSIS — E890 Postprocedural hypothyroidism: Secondary | ICD-10-CM | POA: Diagnosis not present

## 2015-07-26 DIAGNOSIS — I10 Essential (primary) hypertension: Secondary | ICD-10-CM | POA: Diagnosis not present

## 2015-07-26 DIAGNOSIS — R7301 Impaired fasting glucose: Secondary | ICD-10-CM | POA: Diagnosis not present

## 2015-07-26 MED FILL — METFORMIN HCL ER 750 MG TAB: 750 | 30 days supply | Qty: 30 | Fill #0

## 2015-08-05 DIAGNOSIS — E7800 Pure hypercholesterolemia, unspecified: Secondary | ICD-10-CM | POA: Diagnosis not present

## 2015-08-09 MED FILL — LEVOTHYROXINE 125 MCG TAB: 125 | 30 days supply | Qty: 30 | Fill #6

## 2015-08-09 MED FILL — ESTRADIOL 0.05 MG PATCH: 0.05 | 28 days supply | Qty: 8 | Fill #3

## 2015-08-29 MED FILL — KLOR-CON M20 TABLET: 20 | 30 days supply | Qty: 60 | Fill #2

## 2015-08-29 MED FILL — LIVALO 2 MG TABLET: 2 | 30 days supply | Qty: 30 | Fill #4

## 2015-09-02 ENCOUNTER — Other Ambulatory Visit: Payer: Self-pay

## 2015-09-02 DIAGNOSIS — Z1231 Encounter for screening mammogram for malignant neoplasm of breast: Secondary | ICD-10-CM

## 2015-09-10 MED FILL — LEVOTHYROXINE 125 MCG TAB: 125 | 30 days supply | Qty: 30 | Fill #7

## 2015-09-10 MED FILL — ESTRADIOL 0.05 MG PATCH: 0.05 | 28 days supply | Qty: 8 | Fill #4

## 2015-09-17 DIAGNOSIS — N920 Excessive and frequent menstruation with regular cycle: Secondary | ICD-10-CM | POA: Diagnosis not present

## 2015-09-17 DIAGNOSIS — N951 Menopausal and female climacteric states: Secondary | ICD-10-CM | POA: Diagnosis not present

## 2015-10-01 MED FILL — LOSARTAN POTASSIUM 100 MG T: 100 | 90 days supply | Qty: 90 | Fill #1

## 2015-10-01 MED FILL — LIVALO 2 MG TABLET: 2 | 30 days supply | Qty: 30 | Fill #5

## 2015-10-01 MED FILL — METFORMIN HCL ER 750 MG TAB: 750 | 30 days supply | Qty: 30 | Fill #1

## 2015-10-02 ENCOUNTER — Ambulatory Visit
Admission: RE | Admit: 2015-10-02 | Discharge: 2015-10-02 | Disposition: A | Discharge status: Home / Self Care | Payer: 59 | Source: Ambulatory Visit

## 2015-10-02 DIAGNOSIS — Z1231 Encounter for screening mammogram for malignant neoplasm of breast: Secondary | ICD-10-CM

## 2015-10-04 ENCOUNTER — Other Ambulatory Visit: Payer: Self-pay | Admitting: Family Medicine

## 2015-10-04 DIAGNOSIS — R928 Other abnormal and inconclusive findings on diagnostic imaging of breast: Secondary | ICD-10-CM

## 2015-10-09 MED FILL — KLOR-CON M20 TABLET: 20 | 30 days supply | Qty: 60 | Fill #3

## 2015-10-09 MED FILL — LEVOTHYROXINE 125 MCG TAB: 125 | 30 days supply | Qty: 30 | Fill #8

## 2015-10-15 ENCOUNTER — Ambulatory Visit
Admission: RE | Admit: 2015-10-15 | Discharge: 2015-10-15 | Disposition: A | Discharge status: Home / Self Care | Payer: 59 | Source: Ambulatory Visit | Attending: Family Medicine | Admitting: Family Medicine

## 2015-10-15 ENCOUNTER — Other Ambulatory Visit: Payer: Self-pay | Admitting: Family Medicine

## 2015-10-15 DIAGNOSIS — R921 Mammographic calcification found on diagnostic imaging of breast: Secondary | ICD-10-CM | POA: Diagnosis not present

## 2015-10-15 DIAGNOSIS — C50211 Malignant neoplasm of upper-inner quadrant of right female breast: Secondary | ICD-10-CM | POA: Diagnosis not present

## 2015-10-15 DIAGNOSIS — R928 Other abnormal and inconclusive findings on diagnostic imaging of breast: Secondary | ICD-10-CM

## 2015-10-15 DIAGNOSIS — N63 Unspecified lump in breast: Secondary | ICD-10-CM | POA: Diagnosis not present

## 2015-10-17 ENCOUNTER — Telehealth: Payer: Self-pay | Admitting: *Deleted

## 2015-10-17 ENCOUNTER — Encounter: Payer: Self-pay | Admitting: *Deleted

## 2015-10-17 DIAGNOSIS — C50211 Malignant neoplasm of upper-inner quadrant of right female breast: Secondary | ICD-10-CM

## 2015-10-17 DIAGNOSIS — Z170 Estrogen receptor positive status [ER+]: Secondary | ICD-10-CM | POA: Insufficient documentation

## 2015-10-17 HISTORY — DX: Malignant neoplasm of upper-inner quadrant of right female breast: C50.211

## 2015-10-17 NOTE — Telephone Encounter (Signed)
Confirmed [facility] for 10/23/15 at 1215 .  Instructions and contact information given. 

## 2015-10-18 ENCOUNTER — Telehealth: Payer: Self-pay | Admitting: *Deleted

## 2015-10-18 NOTE — Telephone Encounter (Signed)
Mailed clinic packet to pt.  

## 2015-10-23 ENCOUNTER — Other Ambulatory Visit (HOSPITAL_BASED_OUTPATIENT_CLINIC_OR_DEPARTMENT_OTHER): Payer: 59

## 2015-10-23 ENCOUNTER — Encounter: Payer: Self-pay | Admitting: Physical Therapy

## 2015-10-23 ENCOUNTER — Encounter: Payer: Self-pay | Admitting: General Practice

## 2015-10-23 ENCOUNTER — Encounter: Payer: Self-pay | Admitting: Oncology

## 2015-10-23 ENCOUNTER — Ambulatory Visit: Payer: 59 | Attending: General Surgery | Admitting: Physical Therapy

## 2015-10-23 ENCOUNTER — Ambulatory Visit
Admission: RE | Admit: 2015-10-23 | Discharge: 2015-10-23 | Disposition: A | Discharge status: Home / Self Care | Payer: 59 | Source: Ambulatory Visit | Attending: Radiation Oncology | Admitting: Radiation Oncology

## 2015-10-23 ENCOUNTER — Other Ambulatory Visit: Payer: Self-pay | Admitting: General Surgery

## 2015-10-23 ENCOUNTER — Ambulatory Visit (HOSPITAL_BASED_OUTPATIENT_CLINIC_OR_DEPARTMENT_OTHER): Payer: 59 | Admitting: Oncology

## 2015-10-23 VITALS — BP 165/80 | HR 91 | Temp 98.4°F | Resp 18 | Ht 66.0 in | Wt 259.1 lb

## 2015-10-23 DIAGNOSIS — Z79810 Long term (current) use of selective estrogen receptor modulators (SERMs): Secondary | ICD-10-CM | POA: Diagnosis not present

## 2015-10-23 DIAGNOSIS — C50211 Malignant neoplasm of upper-inner quadrant of right female breast: Secondary | ICD-10-CM

## 2015-10-23 DIAGNOSIS — Z170 Estrogen receptor positive status [ER+]: Secondary | ICD-10-CM | POA: Diagnosis not present

## 2015-10-23 DIAGNOSIS — C50411 Malignant neoplasm of upper-outer quadrant of right female breast: Secondary | ICD-10-CM | POA: Diagnosis not present

## 2015-10-23 DIAGNOSIS — R293 Abnormal posture: Secondary | ICD-10-CM | POA: Insufficient documentation

## 2015-10-23 LAB — CBC WITH DIFFERENTIAL/PLATELET
BASO%: 0.6 % (ref 0.0–2.0)
Basophils Absolute: 0 10*3/uL (ref 0.0–0.1)
EOS%: 2.3 % (ref 0.0–7.0)
Eosinophils Absolute: 0.2 10*3/uL (ref 0.0–0.5)
HCT: 39.3 % (ref 34.8–46.6)
HGB: 12.9 g/dL (ref 11.6–15.9)
LYMPH%: 20.9 % (ref 14.0–49.7)
MCH: 28.9 pg (ref 25.1–34.0)
MCHC: 32.9 g/dL (ref 31.5–36.0)
MCV: 87.7 fL (ref 79.5–101.0)
MONO#: 0.4 10*3/uL (ref 0.1–0.9)
MONO%: 6.2 % (ref 0.0–14.0)
NEUT#: 4.7 10*3/uL (ref 1.5–6.5)
NEUT%: 70 % (ref 38.4–76.8)
Platelets: 306 10*3/uL (ref 145–400)
RBC: 4.48 10*6/uL (ref 3.70–5.45)
RDW: 14.5 % (ref 11.2–14.5)
WBC: 6.8 10*3/uL (ref 3.9–10.3)
lymph#: 1.4 10*3/uL (ref 0.9–3.3)

## 2015-10-23 LAB — COMPREHENSIVE METABOLIC PANEL
ALT: 19 U/L (ref 0–55)
AST: 16 U/L (ref 5–34)
Albumin: 3.7 g/dL (ref 3.5–5.0)
Alkaline Phosphatase: 142 U/L (ref 40–150)
Anion Gap: 9 mEq/L (ref 3–11)
BUN: 9.9 mg/dL (ref 7.0–26.0)
CO2: 27 mEq/L (ref 22–29)
Calcium: 9.9 mg/dL (ref 8.4–10.4)
Chloride: 104 mEq/L (ref 98–109)
Creatinine: 1 mg/dL (ref 0.6–1.1)
EGFR: 78 mL/min/{1.73_m2} — ABNORMAL LOW (ref >90)
Glucose: 96 mg/dl (ref 70–140)
Potassium: 3.8 mEq/L (ref 3.5–5.1)
Sodium: 140 mEq/L (ref 136–145)
Total Bilirubin: 0.46 mg/dL (ref 0.20–1.20)
Total Protein: 7.9 g/dL (ref 6.4–8.3)

## 2015-10-23 MED ORDER — VENLAFAXINE HCL ER 37.5 MG PO CP24: 37.5000 mg | ORAL_CAPSULE | Freq: Every day | ORAL | Status: DC

## 2015-10-23 MED ORDER — TAMOXIFEN CITRATE 20 MG PO TABS: 20.0000 mg | ORAL_TABLET | Freq: Every day | ORAL | Status: DC

## 2015-10-23 MED FILL — TAMOXIFEN 20 MG TABLET: 20 | 90 days supply | Qty: 90 | Fill #0

## 2015-10-23 MED FILL — VENLAFAXINE HCL ER 37.5 MG: 37.5 | 30 days supply | Qty: 30 | Fill #0

## 2015-10-23 NOTE — Progress Notes (Signed)
[place]  [phone] [phone]     ID: Debbie [name] DOB: 1965/09/11  MR#: [id number]  QJJ#:[id number]  Patient Care Team: Kelton Pillar, MD as PCP - General (Family Medicine) Jacelyn Pi, MD as Consulting Physician (Endocrinology) Eldred Manges, MD as Consulting Physician (Obstetrics and Gynecology) Autumn Messing III, MD as Consulting Physician (General Surgery) Chauncey Cruel, MD as Consulting Physician (Oncology) [name], MD as Attending Physician (Radiation Oncology) Belva Crome, MD as Consulting Physician (Cardiology) PCP: Osborne Casco, MD OTHER MD:  CHIEF COMPLAINT: Estrogen receptor positive breast cancer  CURRENT TREATMENT: Awaiting definitive surgery   BREAST CANCER HISTORY: Debbie [name] had screening mammography with tomosynthesis at the [facility] 10/02/2015. This shows 2 possible masses in the right breast as well as some suspicious calcifications. The left breast was unremarkable. On 10/15/2015 she underwent right diagnostic mammography with tomography and right breast ultrasonography. The breast density was category B. Spot magnification views confirmed an area of pleomorphic calcifications measuring up to 5 cm in the upper half of the breast. Also in the upper outer right breast there were adjacent low-density masses without distortion or calcifications. On physical exam there was no palpable abnormality.  Targeted ultrasound of the right breast found the 2 "masses" in the right upper quadrant were benign cysts measuring 0.8 and 0.5 cm respectively. Biopsy of the area of calcification on 10/15/2015 showed ([id number]) invasive ductal carcinoma, E-cadherin positive, grade 2, estrogen receptor 90% positive, progesterone receptor 50% positive, both with strong staining intensity, with an MIB-1 of 70%, and no HER-2 amplification, the signals ratio being 1.18 and the number per cell 2.60.  Her subsequent  history is as detailed below    INTERVAL HISTORY: Debbie [name] was evaluated in the multidisciplinary breast cancer clinic 10/23/2015. Her case was also presented in the multidisciplinary breast cancer conference that same morning. At that time a preliminary plan was proposed: Likely breast MRI,  consideration of mastectomy with or without oncoplastic surgery, Oncotype DX with the understanding the patient likely would benefit from chemotherapy, radiation and hormones as appropriate  REVIEW OF SYSTEMS: There were no specific symptoms leading to the original mammogram, which was routinely scheduled. The patient denies unusual headaches, visual changes, nausea, vomiting, stiff neck, dizziness, or gait imbalance. There has been no cough, phlegm production, or pleurisy, no chest pain or pressure, and no change in bowel or bladder habits. The patient denies fever, rash, bleeding, unexplained fatigue or unexplained weight loss. Debbie [name] does admit to some low back pain which is chronic, not more intense or persistent than before she has seasonal allergies. She has been evaluated for palpitations by Dr. Tamala Julian, but released. She has mild stress urinary incontinence. A detailed review of systems was otherwise entirely negative.   PAST MEDICAL HISTORY: Past Medical History  Diagnosis Date  . Hypothyroidism   . Menorrhagia 2011  . Urinary incontinence 2010  . Elevated cholesterol   . Allergy   . Heart murmur   . Hypertension   . Breast cancer of upper-inner quadrant of right female breast ([place]) 10/17/2015  . Anxiety     PAST SURGICAL HISTORY: Past Surgical History  Procedure Laterality Date  . Tubal ligation    . Cesarean section    . Dental surgery      2005,2013    FAMILY HISTORY Family History  Problem Relation Age of Onset  . Diabetes Mother   . Hypertension Mother   . Hypertension Father   . Hypertension Sister   .  Diabetes Maternal Grandmother   . Cancer Paternal Grandmother     colon    . Colon cancer Paternal Grandmother   . Esophageal cancer Neg Hx   . Stomach cancer Neg Hx   . Rectal cancer Neg Hx   The patient's parents are still living, in their early 37s as of June 2017. The patient has one brother, 2 sisters. On the maternal side there is a history of uterine and prostate cancer. On the paternal side there is a history of colon cancer and possibly uterine cancer. There is no history of breast or ovarian cancer in the family.   GYNECOLOGIC HISTORY:  No LMP recorded.  Menarche age 49. She still having regular periods. She is GX P1, first pregnancy age 55. She used oral contraceptives for a few years remotely, with no complications.   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 Her husband Debbie [name] is a Administrator. Their son Debbie [name] is a radio DJ. The patient has no grandchildren. She is not a Ambulance person.    ADVANCED DIRECTIVES: Not in place   HEALTH MAINTENANCE: Social History  Substance Use Topics  . Smoking status: Never Smoker   . Smokeless tobacco: Never Used  . Alcohol Use: No     Colonoscopy: January 2016   PAP: January 2016   Bone density: Never   Lipid panel:  No Known Allergies  Current Outpatient Prescriptions  Medication Sig Dispense Refill  . calcium carbonate (OS-CAL) 600 MG TABS Take 600 mg by mouth daily.    . Cetirizine HCl (ZYRTEC ALLERGY PO) Take by mouth as needed.    . DiphenhydrAMINE HCl (BENADRYL ALLERGY PO) Take by mouth as needed.    Marland Kitchen Fexofenadine HCl (MUCINEX ALLERGY PO) Take by mouth as needed.    . fish oil-omega-3 fatty acids 1000 MG capsule Take 1 g by mouth daily.    . hydrochlorothiazide (MICROZIDE) 12.5 MG capsule Take 12.5 mg by mouth daily.    . Ibuprofen (ADVIL PO) Take 200 mg by mouth as needed.    Marland Kitchen levothyroxine (SYNTHROID, LEVOTHROID) 125 MCG tablet Take 125 mcg by mouth daily.    Marland Kitchen LIVALO 2 MG TABS   6  . metFORMIN (GLUCOPHAGE-XR) 750 MG 24 hr tablet   6  . Multiple  Vitamins-Minerals (MULTIVITAMIN WITH MINERALS) tablet Take 1 tablet by mouth daily.    . potassium chloride SA (K-DUR,KLOR-CON) 20 MEQ tablet Take 20 mEq by mouth 2 (two) times daily.    Marland Kitchen acetaminophen (TYLENOL) 325 MG tablet Take 650 mg by mouth every 6 (six) hours as needed. Reported on 10/23/2015    . tamoxifen (NOLVADEX) 20 MG tablet Take 1 tablet (20 mg total) by mouth daily. 90 tablet 12   No current facility-administered medications for this visit.    OBJECTIVE: Middle-aged African-American woman who appears stated age 50 Vitals:   10/23/15 1252  BP: 165/80  Pulse: 91  Temp: 98.4 F (36.9 C)  Resp: 18     Body mass index is 41.84 kg/(m^2).    ECOG FS:0 - Asymptomatic  Ocular: Sclerae unicteric, pupils equal, round and reactive to light Ear-nose-throat: Oropharynx clear and moist Lymphatic: No cervical or supraclavicular adenopathy Lungs no rales or rhonchi, good excursion bilaterally Heart regular rate and rhythm, no murmur appreciated Abd soft, nontender, positive bowel sounds MSK no focal spinal tenderness, no joint edema Neuro: non-focal, well-oriented, appropriate affect Breasts: I do not palpate a mass in the right breast. There  is a small ecchymosis at the site of the recent biopsy. I do not palpate any axillary masses. The left breast is unremarkable.Marland Kitchen   LAB RESULTS:  CMP     Component Value Date/Time   NA 140 10/23/2015 1228   K 3.8 10/23/2015 1228   CO2 27 10/23/2015 1228   GLUCOSE 96 10/23/2015 1228   BUN 9.9 10/23/2015 1228   CREATININE 1.0 10/23/2015 1228   CALCIUM 9.9 10/23/2015 1228   PROT 7.9 10/23/2015 1228   ALBUMIN 3.7 10/23/2015 1228   AST 16 10/23/2015 1228   ALT 19 10/23/2015 1228   ALKPHOS 142 10/23/2015 1228   BILITOT 0.46 10/23/2015 1228    INo results found for: SPEP, UPEP  Lab Results  Component Value Date   WBC 6.8 10/23/2015   NEUTROABS 4.7 10/23/2015   HGB 12.9 10/23/2015   HCT 39.3 10/23/2015   MCV 87.7 10/23/2015   PLT  306 10/23/2015      Chemistry      Component Value Date/Time   NA 140 10/23/2015 1228   K 3.8 10/23/2015 1228   CO2 27 10/23/2015 1228   BUN 9.9 10/23/2015 1228   CREATININE 1.0 10/23/2015 1228      Component Value Date/Time   CALCIUM 9.9 10/23/2015 1228   ALKPHOS 142 10/23/2015 1228   AST 16 10/23/2015 1228   ALT 19 10/23/2015 1228   BILITOT 0.46 10/23/2015 1228       No results found for: LABCA2  No components found for: LABCA125  No results for input(s): INR in the last 168 hours.  Urinalysis No results found for: COLORURINE, APPEARANCEUR, LABSPEC, [place], GLUCOSEU, HGBUR, BILIRUBINUR, KETONESUR, PROTEINUR, UROBILINOGEN, NITRITE, LEUKOCYTESUR   STUDIES: US Breast Ltd Uni Right Inc Axilla  10/15/2015  CLINICAL DATA:  Recall from screening mammography, possible masses and separate calcifications in the right breast. EXAM: 2D DIGITAL DIAGNOSTIC RIGHT MAMMOGRAM WITH CAD AND ADJUNCT TOMO ULTRASOUND RIGHT BREAST COMPARISON:  Mammography 10/02/2015, 09/27/2014 and earlier. No prior right breast ultrasound. ACR Breast Density Category b: There are scattered areas of fibroglandular density. FINDINGS: Standard spot magnification CC and mediolateral views of the calcifications in the inner right breast, middle depth, and a standard 2D full field mediolateral view of the right breast were obtained. Spot magnification views confirm fine pleomorphic calcifications which span approximately 4 x 3 x 5 cm. There are scattered linear forms. The full field mediolateral view localizes the calcifications to the upper half of the breast. Standard 2D and tomosynthesis spot compression views of the area of concern in the upper outer right breast were obtained. These confirm adjacent circumscribed, low-density masses without associated architectural distortion or suspicious calcification. The full field mediolateral view was processed with CAD. On physical exam, there is no palpable abnormality in the  upper outer right breast. Targeted right breast ultrasound is performed, showing an oval circumscribed parallel anechoic mass with acoustic enhancement and no internal power Doppler flow at the 10 o'clock position approximately 8 cm from the nipple measuring approximately 8 x 5 x 7 mm, corresponding to the largest mammographic mass. At the 9 o'clock position approximately 8 cm from the nipple is an oval circumscribed parallel nearly anechoic mass with internal echoes and no internal power Doppler flow measuring approximately 5 x 2 x 4 mm. No suspicious solid mass or abnormal acoustic shadowing is identified. IMPRESSION: 1. Suspicious calcifications spanning 4 x 3 x 5 cm in the upper inner quadrant of the right breast. 2. Benign cysts in the upper  outer quadrant of the right breast accounting for the area of concern on screening mammography. RECOMMENDATION: Stereotactic tomosynthesis guided biopsy of the right breast calcifications. The biopsy procedure was discussed in detail with the patient and her questions were answered. She has agreed to proceed and this has been scheduled for later today at 2:30 p.m. I have discussed the findings and recommendations with the patient. Results were also provided in writing at the conclusion of the visit. If applicable, a reminder letter will be sent to the patient regarding the next appointment. BI-RADS CATEGORY  4: Suspicious. Electronically Signed   By: Evangeline Dakin M.D.   On: 10/15/2015 12:12   Mm Diag Breast Tomo Uni Right  10/15/2015  CLINICAL DATA:  Post right breast stereotactic/ tomosynthesis guided biopsy. EXAM: 3D DIAGNOSTIC RIGHT MAMMOGRAM POST STEREOTACTIC BIOPSY COMPARISON:  Previous exam(s). FINDINGS: 3D Mammographic images were obtained following stereotactic/tomosynthesis guided biopsy of calcifications with distortion located within the upper inner quadrant of the right breast. The coil shaped clip is in appropriate position. IMPRESSION: Appropriate  position of clip following right breast stereotactic/tomosynthesis guided biopsy. Final Assessment: Post Procedure Mammograms for Marker Placement Electronically Signed   By: Altamese Cabal M.D.   On: 10/15/2015 15:35   Mm Diag Breast Tomo Uni Right  10/15/2015  CLINICAL DATA:  Recall from screening mammography, possible masses and separate calcifications in the right breast. EXAM: 2D DIGITAL DIAGNOSTIC RIGHT MAMMOGRAM WITH CAD AND ADJUNCT TOMO ULTRASOUND RIGHT BREAST COMPARISON:  Mammography 10/02/2015, 09/27/2014 and earlier. No prior right breast ultrasound. ACR Breast Density Category b: There are scattered areas of fibroglandular density. FINDINGS: Standard spot magnification CC and mediolateral views of the calcifications in the inner right breast, middle depth, and a standard 2D full field mediolateral view of the right breast were obtained. Spot magnification views confirm fine pleomorphic calcifications which span approximately 4 x 3 x 5 cm. There are scattered linear forms. The full field mediolateral view localizes the calcifications to the upper half of the breast. Standard 2D and tomosynthesis spot compression views of the area of concern in the upper outer right breast were obtained. These confirm adjacent circumscribed, low-density masses without associated architectural distortion or suspicious calcification. The full field mediolateral view was processed with CAD. On physical exam, there is no palpable abnormality in the upper outer right breast. Targeted right breast ultrasound is performed, showing an oval circumscribed parallel anechoic mass with acoustic enhancement and no internal power Doppler flow at the 10 o'clock position approximately 8 cm from the nipple measuring approximately 8 x 5 x 7 mm, corresponding to the largest mammographic mass. At the 9 o'clock position approximately 8 cm from the nipple is an oval circumscribed parallel nearly anechoic mass with internal echoes and no  internal power Doppler flow measuring approximately 5 x 2 x 4 mm. No suspicious solid mass or abnormal acoustic shadowing is identified. IMPRESSION: 1. Suspicious calcifications spanning 4 x 3 x 5 cm in the upper inner quadrant of the right breast. 2. Benign cysts in the upper outer quadrant of the right breast accounting for the area of concern on screening mammography. RECOMMENDATION: Stereotactic tomosynthesis guided biopsy of the right breast calcifications. The biopsy procedure was discussed in detail with the patient and her questions were answered. She has agreed to proceed and this has been scheduled for later today at 2:30 p.m. I have discussed the findings and recommendations with the patient. Results were also provided in writing at the conclusion of the visit. If applicable,  a reminder letter will be sent to the patient regarding the next appointment. BI-RADS CATEGORY  4: Suspicious. Electronically Signed   By: Evangeline Dakin M.D.   On: 10/15/2015 12:12   Mm Screening Breast Tomo Bilateral  10/03/2015  CLINICAL DATA:  Screening. EXAM: 2D DIGITAL SCREENING BILATERAL MAMMOGRAM WITH CAD AND ADJUNCT TOMO COMPARISON:  Previous exam(s). ACR Breast Density Category b: There are scattered areas of fibroglandular density. FINDINGS: In the right breast, 2 possible masses and calcifications warrant further evaluation. In the left breast, no findings suspicious for malignancy. Images were processed with CAD. IMPRESSION: Further evaluation is suggested for 2 possible masses and calcifications in the right breast. RECOMMENDATION: Diagnostic mammogram and possibly ultrasound of the right breast. (Code:[id number]) The patient will be contacted regarding the findings, and additional imaging will be scheduled. BI-RADS CATEGORY  0: Incomplete. Need additional imaging evaluation and/or prior mammograms for comparison. Electronically Signed   By: Dorise Bullion III M.D   On: 10/03/2015 08:06   Mm Rt Breast Bx W Loc Dev  1st Lesion Image Bx Spec Stereo Guide  10/18/2015  ADDENDUM REPORT: 10/16/2015 15:50 ADDENDUM: Pathology revealed grade II invasive mammary carcinoma and mammary carcinoma in situ in the right breast. This was found to be concordant by Dr. Luberta Robertson. Pathology results were discussed with the patient by telephone. The patient reported doing well after the biopsy. Post biopsy instructions and care were reviewed and questions were answered. The patient was encouraged to call [facility] for any additional concerns. The patient was referred to the [facility] at the [facility] on October 23, 2015. Pathology results reported by Susa Raring RN, BSN on 10/16/2015. Electronically Signed   By: Altamese Cabal M.D.   On: 10/16/2015 15:50  10/18/2015  CLINICAL DATA:  Suspicious right breast calcifications and possible distortion. EXAM: LEFT BREAST STEREOTACTIC CORE NEEDLE BIOPSY COMPARISON:  Previous exams. FINDINGS: The patient and I discussed the procedure of stereotactic-guided biopsy including benefits and alternatives. We discussed the high likelihood of a successful procedure. We discussed the risks of the procedure including infection, bleeding, tissue injury, clip migration, and inadequate sampling. Informed written consent was given. The usual time out protocol was performed immediately prior to the procedure. Using sterile technique and 1% Lidocaine as local anesthetic, under stereotactic guidance, a 9 gauge vacuum assisted device was used to perform core needle biopsy of calcifications with possible distortion within the upper inner quadrant of the right breast using a superior craniocaudal approach. Specimen radiograph was performed showing representative calcifications within the specimen. Specimens with calcifications are identified for pathology. At the conclusion of the procedure, a coil shaped tissue marker clip was deployed into  the biopsy cavity. Follow-up 2-view mammogram was performed and dictated separately. IMPRESSION: Stereotactic-guided biopsy of right breast calcifications with distortion as discussed above. No apparent complications. Electronically Signed: By: Altamese Cabal M.D. On: 10/15/2015 15:33    ELIGIBLE FOR AVAILABLE RESEARCH PROTOCOL: PALLAS  ASSESSMENT: 50 y.o. Debbie [name] woman status post right breast upper inner quadrant biopsy 10/15/2015 for a clinical T2-T3 N0, stage II invasive ductal carcinoma, estrogen and progesterone receptor positive, HER-2 nonamplified, with an Mib-1 of 70%  (1) definitive surgery pending  (2) type DX to be obtained from the definitive surgery sample  (3) the patient is likely to benefit from adjuvant chemotherapy  (4) adjuvant radiation to follow as appropriate  (5) started on tamoxifen neoadjuvantly 10/23/2015 to allow for expected delays in surgery  scheduling  PLAN: We spent the better part of today's hour-long appointment discussing the biology of breast cancer in general, and the specifics of the patient's tumor in particular. We first reviewed the fact that cancer is not one disease but more than 100 different diseases and that it is important to keep them separate otherwise has friends and relatives discussed their own situation with the patient a confusion can result. Similarly we explained that if rest cancer spread to the bone or liver, the patient would not have bone cancer or liver cancer, but breast cancer in the bone and breast cancer in the liver, namely 1 cancer in 3 places not 3 different cancers which otherwise would have to be treated in 3 different ways.  We discussed the difference between local and systemic therapy. In terms of local treatment, lumpectomy plus radiation is equivalent to mastectomy in terms of survival. We also noted that in terms of sequencing of treatments whether systemic therapy or surgery is done first, does not affect the  ultimate outcome.  We then reviewed his systemic therapy options. These include anti-estrogens, anti-HER-2 treatment, and chemotherapy. She is a good candidate for anti-estrogen therapy, not all her candidate for anti-HER-2 treatment, since her breast cancer is HER-2 negative. The question of chemotherapy is more complicated. In some cases, even locally advanced, chemotherapy actually contributes very little to the patient's risk reduction. For that reason we are going to request an Oncotype from the definitive surgical sample. That will help [place] make a numeric and definitive decision regarding chemotherapy in this case.  After much discussion Debbie [name] is agreeable to starting with surgery, and specifically this will involve a large lumpectomy with bilateral breast reductions. She will be meeting with plastic surgery and then the schedules of the 2 surgeons involved we'll have to mesh. Accordingly some delay can be expected.  For that reason we decided to start neoadjuvant tamoxifen. This way Debbie [name] can take her time making her surgical decisions and even if she waits some months before surgery she would be placing herself at any risk of the cancer progressing. In addition she would potentially benefit from some shrinkage of the tumor. Finally she would be able to give anti-estrogens to try so she is in a better position to choose the agent as she will be on for some years after the end of local therapy  Because she has a Mirena IUD in place, we are going with tamoxifen. We discussed the possible toxicities, side effects and complications of this agent and underwent ahead and placed the prescription. She will started today. She will call me with any problems that may develop. She will return to see me in late July, by which time I expect her to have recovered from her surgery and have the Oncotype results in hand.  Debbie [name] has a good understanding of the overall plan. She agrees with it. She knows the goal  of treatment in her case is cure. She will call with any problems that may develop before her next visit here.  Chauncey Cruel, MD   10/23/2015 5:46 PM Medical Oncology and Hematology [facility] [address] [place], [place] [address] Tel. [phone]    Fax. [phone]

## 2015-10-23 NOTE — Progress Notes (Signed)
Met with patient at [facility] to introduce [facility] team/resources, reviewing distress screen per protocol. Patient scored a 7 on the Psychosocial distress thermometer which indicates severe distress. Also assessed for distress and other psychosocial needs. ONCBCN DISTRESS SCREENING 10/23/2015  Screening Type Initial Screening  Distress experienced in past week (1-10) 7  Emotional problem type Nervousness/Anxiety;Adjusting to illness;Feeling hopeless  Referral to support programs Yes  Other Spiritual Care, Counseling Intern   Counseling Intern Donnella Bi Dalisha Shively), processed with client how she was handling the news, discussed coping mechanisms that included her family, friends, co-workers, and faith, and reflected on other struggles the patient has overcome in her past. Counselor used reflection of feelings and content to capture client's anxiety, hope, and resilience. Patient reported that her distress had lowered significantly from a 7 to a 4 after talking with the medical professional and support team. Patient is optimistic about her treatment moving forward.  Follow up need:N Pt is aware of ongoing Support Team availability and contact information. Please call as needs arise/circumstances change. Thank you.  Wendall Papa, MS, [place], LPCA Counseling Intern-Department for Spiritual Care and [place], [place], [place]

## 2015-10-23 NOTE — Progress Notes (Signed)
Radiation Oncology         [phone] ________________________________  Initial outpatient Consultation  Name: Debbie [name] MRN: [id number]  Date: 10/23/2015  DOB: 25-Apr-1966  [id number] Debbie Sers, MD  Debbie Kussmaul, MD   REFERRING PHYSICIAN: Jovita Kussmaul, MD  DIAGNOSIS:    ICD-9-CM ICD-10-CM   1. Breast cancer of upper-inner quadrant of right female breast ([place]) 174.2 C50.211    Stage IIA T2N0M0 Right Breast UIQ Invasive Mammary Carcinoma with In Situ disease, ER+ / PR+ / Her2neg, Grade 2  HISTORY OF PRESENT ILLNESS::Debbie [name] is a 50 y.o. female who presented with right breast masses and 5cm span of calcifications on screening mammogram. The right breast masses on further workup were revealed to be cysts. Regarding the calcifications, biopsy showed invasive and in situ mammary carcinoma with characteristics as described above in the diagnosis.  She was on Estradiol, stopped 2 wks ago.   She has one son, who works in radio on [employer] for [employer]  Her husband is here today   PREVIOUS RADIATION THERAPY: No  PAST MEDICAL HISTORY:  has a past medical history of Hypothyroidism; Menorrhagia (2011); Urinary incontinence (2010); Elevated cholesterol; Allergy; Heart murmur; Hypertension; Breast cancer of upper-inner quadrant of right female breast ([place]) (10/17/2015); and Anxiety.    PAST SURGICAL HISTORY: Past Surgical History  Procedure Laterality Date  . Tubal ligation    . Cesarean section    . Dental surgery      [phone]    FAMILY HISTORY: family history includes Cancer in her paternal grandmother; Colon cancer in her paternal grandmother; Diabetes in her maternal grandmother and mother; Hypertension in her father, mother, and sister. There is no history of Esophageal cancer, Stomach cancer, or Rectal cancer.  SOCIAL HISTORY:  reports that she has never smoked. She has never used smokeless tobacco. She reports that she does not drink alcohol or use  illicit drugs.  ALLERGIES: Review of patient's allergies indicates no known allergies.  MEDICATIONS:  Current Outpatient Prescriptions  Medication Sig Dispense Refill  . acetaminophen (TYLENOL) 325 MG tablet Take 650 mg by mouth every 6 (six) hours as needed. Reported on 10/23/2015    . calcium carbonate (OS-CAL) 600 MG TABS Take 600 mg by mouth daily.    . Cetirizine HCl (ZYRTEC ALLERGY PO) Take by mouth as needed.    . DiphenhydrAMINE HCl (BENADRYL ALLERGY PO) Take by mouth as needed.    Marland Kitchen Fexofenadine HCl (MUCINEX ALLERGY PO) Take by mouth as needed.    . fish oil-omega-3 fatty acids 1000 MG capsule Take 1 g by mouth daily.    . hydrochlorothiazide (MICROZIDE) 12.5 MG capsule Take 12.5 mg by mouth daily.    . Ibuprofen (ADVIL PO) Take 200 mg by mouth as needed.    Marland Kitchen levothyroxine (SYNTHROID, LEVOTHROID) 125 MCG tablet Take 125 mcg by mouth daily.    Marland Kitchen LIVALO 2 MG TABS   6  . metFORMIN (GLUCOPHAGE-XR) 750 MG 24 hr tablet   6  . Multiple Vitamins-Minerals (MULTIVITAMIN WITH MINERALS) tablet Take 1 tablet by mouth daily.    . potassium chloride SA (K-DUR,KLOR-CON) 20 MEQ tablet Take 20 mEq by mouth 2 (two) times daily.    . tamoxifen (NOLVADEX) 20 MG tablet Take 1 tablet (20 mg total) by mouth daily. 90 tablet 12   No current facility-administered medications for this encounter.    REVIEW OF SYSTEMS:    Notable for that above. Complete 15 point review of systems reviewed from  nursing intake sheet.    PHYSICAL EXAM:    Vitals - 1 value per visit [id number]  SYSTOLIC [id number]  DIASTOLIC 80  Pulse 91  Temperature 98.4  Respirations 18  Weight (lb) 259.1  Height 5\' 6"   BMI 41.84  VISIT REPORT    General: Alert and oriented, in no acute distress HEENT: Head is normocephalic. Extraocular movements are intact. Oropharynx is clear. Upper dentures Neck: Neck is supple, no palpable cervical or supraclavicular lymphadenopathy. Heart: Regular in rate and rhythm with no murmurs, rubs, or  gallops. Chest: Clear to auscultation bilaterally, with no rhonchi, wheezes, or rales. Abdomen: Soft, nontender, nondistended, with no rigidity or guarding. Extremities: No cyanosis or edema. Lymphatics: see Neck Exam Skin: No concerning lesions. Musculoskeletal: symmetric strength and muscle tone throughout. Neurologic: Cranial nerves II through XII are grossly intact. No obvious focalities. Speech is fluent. Coordination is intact. Psychiatric: Judgment and insight are intact. Affect is appropriate. Breasts: no obvious mass, but some thickening in UIQ of right breast post biopsy . No other palpable masses appreciated in the breasts or axillae bilaterally .    ECOG = 0  0 - Asymptomatic (Fully active, able to carry on all predisease activities without restriction)  1 - Symptomatic but completely ambulatory (Restricted in physically strenuous activity but ambulatory and able to carry out work of a light or sedentary nature. For example, light housework, office work)  2 - Symptomatic, <50% in bed during the day (Ambulatory and capable of all self care but unable to carry out any work activities. Up and about more than 50% of waking hours)  3 - Symptomatic, >50% in bed, but not bedbound (Capable of only limited self-care, confined to bed or chair 50% or more of waking hours)  4 - Bedbound (Completely disabled. Cannot carry on any self-care. Totally confined to bed or chair)  5 - Death   Eustace Pen MM, Creech RH, Tormey DC, et al. [phone]). "Toxicity and response criteria of the [facility]". [place] Oncol. 5 (6): 649-55   LABORATORY DATA:  Lab Results  Component Value Date   WBC 6.8 10/23/2015   HGB 12.9 10/23/2015   HCT 39.3 10/23/2015   MCV 87.7 10/23/2015   PLT 306 10/23/2015   CMP     Component Value Date/Time   NA 140 10/23/2015 1228   K 3.8 10/23/2015 1228   CO2 27 10/23/2015 1228   GLUCOSE 96 10/23/2015 1228   BUN 9.9 10/23/2015 1228    CREATININE 1.0 10/23/2015 1228   CALCIUM 9.9 10/23/2015 1228   PROT 7.9 10/23/2015 1228   ALBUMIN 3.7 10/23/2015 1228   AST 16 10/23/2015 1228   ALT 19 10/23/2015 1228   ALKPHOS 142 10/23/2015 1228   BILITOT 0.46 10/23/2015 1228         RADIOGRAPHY: US Breast Ltd Uni Right Inc Axilla  10/15/2015  CLINICAL DATA:  Recall from screening mammography, possible masses and separate calcifications in the right breast. EXAM: 2D DIGITAL DIAGNOSTIC RIGHT MAMMOGRAM WITH CAD AND ADJUNCT TOMO ULTRASOUND RIGHT BREAST COMPARISON:  Mammography 10/02/2015, 09/27/2014 and earlier. No prior right breast ultrasound. ACR Breast Density Category b: There are scattered areas of fibroglandular density. FINDINGS: Standard spot magnification CC and mediolateral views of the calcifications in the inner right breast, middle depth, and a standard 2D full field mediolateral view of the right breast were obtained. Spot magnification views confirm fine pleomorphic calcifications which span approximately 4 x 3 x 5 cm. There are scattered linear  forms. The full field mediolateral view localizes the calcifications to the upper half of the breast. Standard 2D and tomosynthesis spot compression views of the area of concern in the upper outer right breast were obtained. These confirm adjacent circumscribed, low-density masses without associated architectural distortion or suspicious calcification. The full field mediolateral view was processed with CAD. On physical exam, there is no palpable abnormality in the upper outer right breast. Targeted right breast ultrasound is performed, showing an oval circumscribed parallel anechoic mass with acoustic enhancement and no internal power Doppler flow at the 10 o'clock position approximately 8 cm from the nipple measuring approximately 8 x 5 x 7 mm, corresponding to the largest mammographic mass. At the 9 o'clock position approximately 8 cm from the nipple is an oval circumscribed parallel nearly  anechoic mass with internal echoes and no internal power Doppler flow measuring approximately 5 x 2 x 4 mm. No suspicious solid mass or abnormal acoustic shadowing is identified. IMPRESSION: 1. Suspicious calcifications spanning 4 x 3 x 5 cm in the upper inner quadrant of the right breast. 2. Benign cysts in the upper outer quadrant of the right breast accounting for the area of concern on screening mammography. RECOMMENDATION: Stereotactic tomosynthesis guided biopsy of the right breast calcifications. The biopsy procedure was discussed in detail with the patient and her questions were answered. She has agreed to proceed and this has been scheduled for later today at 2:30 p.m. I have discussed the findings and recommendations with the patient. Results were also provided in writing at the conclusion of the visit. If applicable, a reminder letter will be sent to the patient regarding the next appointment. BI-RADS CATEGORY  4: Suspicious. Electronically Signed   By: Evangeline Dakin M.D.   On: 10/15/2015 12:12   Mm Diag Breast Tomo Uni Right  10/15/2015  CLINICAL DATA:  Post right breast stereotactic/ tomosynthesis guided biopsy. EXAM: 3D DIAGNOSTIC RIGHT MAMMOGRAM POST STEREOTACTIC BIOPSY COMPARISON:  Previous exam(s). FINDINGS: 3D Mammographic images were obtained following stereotactic/tomosynthesis guided biopsy of calcifications with distortion located within the upper inner quadrant of the right breast. The coil shaped clip is in appropriate position. IMPRESSION: Appropriate position of clip following right breast stereotactic/tomosynthesis guided biopsy. Final Assessment: Post Procedure Mammograms for Marker Placement Electronically Signed   By: Altamese Cabal M.D.   On: 10/15/2015 15:35   Mm Diag Breast Tomo Uni Right  10/15/2015  CLINICAL DATA:  Recall from screening mammography, possible masses and separate calcifications in the right breast. EXAM: 2D DIGITAL DIAGNOSTIC RIGHT MAMMOGRAM WITH CAD AND  ADJUNCT TOMO ULTRASOUND RIGHT BREAST COMPARISON:  Mammography 10/02/2015, 09/27/2014 and earlier. No prior right breast ultrasound. ACR Breast Density Category b: There are scattered areas of fibroglandular density. FINDINGS: Standard spot magnification CC and mediolateral views of the calcifications in the inner right breast, middle depth, and a standard 2D full field mediolateral view of the right breast were obtained. Spot magnification views confirm fine pleomorphic calcifications which span approximately 4 x 3 x 5 cm. There are scattered linear forms. The full field mediolateral view localizes the calcifications to the upper half of the breast. Standard 2D and tomosynthesis spot compression views of the area of concern in the upper outer right breast were obtained. These confirm adjacent circumscribed, low-density masses without associated architectural distortion or suspicious calcification. The full field mediolateral view was processed with CAD. On physical exam, there is no palpable abnormality in the upper outer right breast. Targeted right breast ultrasound is performed, showing an  oval circumscribed parallel anechoic mass with acoustic enhancement and no internal power Doppler flow at the 10 o'clock position approximately 8 cm from the nipple measuring approximately 8 x 5 x 7 mm, corresponding to the largest mammographic mass. At the 9 o'clock position approximately 8 cm from the nipple is an oval circumscribed parallel nearly anechoic mass with internal echoes and no internal power Doppler flow measuring approximately 5 x 2 x 4 mm. No suspicious solid mass or abnormal acoustic shadowing is identified. IMPRESSION: 1. Suspicious calcifications spanning 4 x 3 x 5 cm in the upper inner quadrant of the right breast. 2. Benign cysts in the upper outer quadrant of the right breast accounting for the area of concern on screening mammography. RECOMMENDATION: Stereotactic tomosynthesis guided biopsy of the right  breast calcifications. The biopsy procedure was discussed in detail with the patient and her questions were answered. She has agreed to proceed and this has been scheduled for later today at 2:30 p.m. I have discussed the findings and recommendations with the patient. Results were also provided in writing at the conclusion of the visit. If applicable, a reminder letter will be sent to the patient regarding the next appointment. BI-RADS CATEGORY  4: Suspicious. Electronically Signed   By: Evangeline Dakin M.D.   On: 10/15/2015 12:12   Mm Screening Breast Tomo Bilateral  10/03/2015  CLINICAL DATA:  Screening. EXAM: 2D DIGITAL SCREENING BILATERAL MAMMOGRAM WITH CAD AND ADJUNCT TOMO COMPARISON:  Previous exam(s). ACR Breast Density Category b: There are scattered areas of fibroglandular density. FINDINGS: In the right breast, 2 possible masses and calcifications warrant further evaluation. In the left breast, no findings suspicious for malignancy. Images were processed with CAD. IMPRESSION: Further evaluation is suggested for 2 possible masses and calcifications in the right breast. RECOMMENDATION: Diagnostic mammogram and possibly ultrasound of the right breast. (Code:[id number]) The patient will be contacted regarding the findings, and additional imaging will be scheduled. BI-RADS CATEGORY  0: Incomplete. Need additional imaging evaluation and/or prior mammograms for comparison. Electronically Signed   By: Dorise Bullion III M.D   On: 10/03/2015 08:06   Mm Rt Breast Bx W Loc Dev 1st Lesion Image Bx Spec Stereo Guide  10/18/2015  ADDENDUM REPORT: 10/16/2015 15:50 ADDENDUM: Pathology revealed grade II invasive mammary carcinoma and mammary carcinoma in situ in the right breast. This was found to be concordant by Dr. Luberta Robertson. Pathology results were discussed with the patient by telephone. The patient reported doing well after the biopsy. Post biopsy instructions and care were reviewed and questions were  answered. The patient was encouraged to call [facility] for any additional concerns. The patient was referred to the [facility] on October 23, 2015. Pathology results reported by Susa Raring RN, BSN on 10/16/2015. Electronically Signed   By: Altamese Cabal M.D.   On: 10/16/2015 15:50  10/18/2015  CLINICAL DATA:  Suspicious right breast calcifications and possible distortion. EXAM: LEFT BREAST STEREOTACTIC CORE NEEDLE BIOPSY COMPARISON:  Previous exams. FINDINGS: The patient and I discussed the procedure of stereotactic-guided biopsy including benefits and alternatives. We discussed the high likelihood of a successful procedure. We discussed the risks of the procedure including infection, bleeding, tissue injury, clip migration, and inadequate sampling. Informed written consent was given. The usual time out protocol was performed immediately prior to the procedure. Using sterile technique and 1% Lidocaine as local anesthetic, under stereotactic guidance, a 9 gauge vacuum assisted  device was used to perform core needle biopsy of calcifications with possible distortion within the upper inner quadrant of the right breast using a superior craniocaudal approach. Specimen radiograph was performed showing representative calcifications within the specimen. Specimens with calcifications are identified for pathology. At the conclusion of the procedure, a coil shaped tissue marker clip was deployed into the biopsy cavity. Follow-up 2-view mammogram was performed and dictated separately. IMPRESSION: Stereotactic-guided biopsy of right breast calcifications with distortion as discussed above. No apparent complications. Electronically Signed: By: Altamese Cabal M.D. On: 10/15/2015 15:33      IMPRESSION/PLAN: Stage IIA right breast cancer Of note, she is intent on breast conserving surgery and oncoplastic breast reduction for  symmetry bilaterally.  Dr Marlou Starks is contacting Dr Harlow Mares of plastics in this regard.  Oncotype testing will follow to determine if chemotherapy is warranted.  It was a pleasure meeting the patient today. We discussed the risks, benefits, and side effects of radiotherapy. I recommend radiotherapy to the the right breast to reduce her risk of locoregional recurrence by 2/3.  We discussed that radiation would take approximately 7 weeks to complete and that I would give the patient a few weeks to heal following surgery before starting treatment planning.   If chemotherapy were to be given, this would precede radiotherapy. We spoke about acute effects including skin irritation and fatigue as well as much less common late effects including internal organ injury or irritation. We spoke about the latest technology that is used to minimize the risk of late effects for patients undergoing radiotherapy to the breast or chest wall. No guarantees of treatment were given. The patient is enthusiastic about proceeding with treatment. I look forward to participating in the patient's care.   __________________________________________   [name], MD

## 2015-10-23 NOTE — Therapy (Signed)
[place], [place], [address] Phone: [phone]   Fax:  [phone]  Physical Therapy Evaluation  Patient Details  Name: Debbie [name] MRN: [id number] Date of Birth: [birth year] Referring Provider: Dr. Autumn Messing  Encounter Date: 10/23/2015      PT End of Session - 10/23/15 1611    Visit Number 1   Number of Visits 1   PT Start Time 1450   PT Stop Time 9024   PT Time Calculation (min) 24 min   Activity Tolerance Patient tolerated treatment well   Behavior During Therapy [facility] for tasks assessed/performed      Past Medical History  Diagnosis Date  . Hypothyroidism   . Menorrhagia 2011  . Urinary incontinence 2010  . Elevated cholesterol   . Allergy   . Heart murmur   . Hypertension   . Breast cancer of upper-inner quadrant of right female breast ([place]) 10/17/2015  . Anxiety     Past Surgical History  Procedure Laterality Date  . Tubal ligation    . Cesarean section    . Dental surgery      2005,2013    There were no vitals filed for this visit.       Subjective Assessment - 10/23/15 1515    Subjective Patient reports she is here today to be seen by her medical team for her newly diagnosed right breast cancer.   Patient is accompained by: Family member   Pertinent History Patient was diagnosed on 10/02/15 with right grade 2 invasive ductal carcinoma breast cancer.  It measures 5 cm of calcs in the upper inner quadrant, is ER/PR positive, HER2 negative, and has a Ki67 of 70%.    Patient Stated Goals Reduce lymphedema risk and learn post op shoulder ROM HEP   Currently in Pain? No/denies            [facility] PT Assessment - 10/23/15 0001    Assessment   Medical Diagnosis Right breast cancer   Referring Provider Dr. Autumn Messing   Onset Date/Surgical Date 10/02/15   Hand Dominance Left   Prior Therapy none   Precautions   Precautions Other (comment)   Precaution Comments active  breast cancer   Restrictions   Weight Bearing Restrictions No   Balance Screen   Has the patient fallen in the past 6 months No   Has the patient had a decrease in activity level because of a fear of falling?  No   Is the patient reluctant to leave their home because of a fear of falling?  No   Home Environment   Living Environment Private residence   Living Arrangements Spouse/significant other;Children  husband and 38 y.o. 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   Leisure She does not exercise   Cognition   Overall Cognitive Status Within Functional Limits for tasks assessed   Posture/Postural Control   Posture/Postural Control Postural limitations   Postural Limitations Rounded Shoulders;Forward head   ROM / Strength   AROM / PROM / Strength AROM;Strength   AROM   AROM Assessment Site Shoulder;Cervical   Right/Left Shoulder Right;Left   Right Shoulder Extension 40 Degrees   Right Shoulder Flexion 152 Degrees   Right Shoulder ABduction 158 Degrees   Right Shoulder Internal Rotation 55 Degrees   Right Shoulder External Rotation 72 Degrees  Left Shoulder Extension 42 Degrees   Left Shoulder Flexion 149 Degrees   Left Shoulder ABduction 153 Degrees   Left Shoulder Internal Rotation 58 Degrees   Left Shoulder External Rotation 73 Degrees   Cervical Flexion WNL   Cervical Extension WNL   Cervical - Right Side Bend WNL   Cervical - Left Side Bend WNL   Cervical - Right Rotation WNL   Cervical - Left Rotation WNL   Strength   Overall Strength Within functional limits for tasks performed           LYMPHEDEMA/ONCOLOGY QUESTIONNAIRE - 10/23/15 1527    Type   Cancer Type right breast cancer   Lymphedema Assessments   Lymphedema Assessments Upper extremities   Right Upper Extremity Lymphedema   10 cm Proximal to Olecranon Process 36 cm    Olecranon Process 29.4 cm   10 cm Proximal to Ulnar Styloid Process 25.2 cm   Just Proximal to Ulnar Styloid Process 16.4 cm   Across Hand at [employer] 18.8 cm   At [place] of 2nd Digit 6.4 cm   Left Upper Extremity Lymphedema   10 cm Proximal to Olecranon Process 35.2 cm   Olecranon Process 28.9 cm   10 cm Proximal to Ulnar Styloid Process 23.6 cm   Just Proximal to Ulnar Styloid Process 16.2 cm   Across Hand at [employer] 19.1 cm   At [place] of 2nd Digit 6.1 cm      Patient was instructed today in a home exercise program today for post op shoulder range of motion. These included active assist shoulder flexion in sitting, scapular retraction, wall walking with shoulder abduction, and hands behind head external rotation.  She was encouraged to do these twice a day, holding 3 seconds and repeating 5 times when permitted by her physician.         PT Education - 10/23/15 1610    Education provided Yes   Education Details Lymphedema risk reduction and post op shoulder ROM HEP   Person(s) Educated Patient   Methods Explanation;Demonstration;Handout   Comprehension Verbalized understanding;Returned demonstration              Breast Clinic Goals - 10/23/15 1616    Patient will be able to verbalize understanding of pertinent lymphedema risk reduction practices relevant to her diagnosis specifically related to skin care.   Time 1   Period Days   Status Achieved   Patient will be able to return demonstrate and/or verbalize understanding of the post-op home exercise program related to regaining shoulder range of motion.   Time 1   Period Days   Status Achieved   Patient will be able to verbalize understanding of the importance of attending the postoperative After Breast Cancer Class for further lymphedema risk reduction education and therapeutic exercise.   Time 1   Period Days   Status Achieved              Plan - 10/23/15 1613    Clinical Impression Statement  Patient was diagnosed on 10/02/15 with right grade 2 invasive ductal carcinoma breast cancer.  It measures 5 cm of calcs in the upper inner quadrant, is ER/PR positive, HER2 negative, and has a Ki67 of 70%. Her multidisciplinary medical team met prior to her sasessments to determine a recommended treatment plan.  She is planning to undergo a right lumpectomy with bilateral breast reduction and sentinel node biopsy followed by Oncotype testing, radiation and anti-estrogen therapy.  She may benefit  from post op PT to regain shoulder ROM and reduce lymphedema risk.  Due to her family support and lack of comorbidities, her evaluation is of low complexity.   Rehab Potential Excellent   Clinical Impairments Affecting Rehab Potential none   PT Frequency One time visit   PT Treatment/Interventions Therapeutic exercise;Patient/family education   PT Next Visit Plan Will f/u after surgery   PT Home Exercise Plan Post op shoulder ROM HEP   Consulted and Agree with Plan of Care Patient      Patient will benefit from skilled therapeutic intervention in order to improve the following deficits and impairments:  Decreased strength, Decreased knowledge of precautions, Pain, Impaired UE functional use, Decreased range of motion  Visit Diagnosis: Carcinoma of right breast upper inner quadrant ([place]) - Plan: PT plan of care cert/re-cert  Abnormal posture - Plan: PT plan of care cert/re-cert   Patient will follow up at outpatient cancer rehab if needed following surgery.  If the patient requires physical therapy at that time, a specific plan will be dictated and sent to the referring physician for approval. The patient was educated today on appropriate basic range of motion exercises to begin post operatively and the importance of attending the After Breast Cancer class following surgery.  Patient was educated today on lymphedema risk reduction practices as it pertains to recommendations that will benefit the patient  immediately following surgery.  She verbalized good understanding.  No additional physical therapy is indicated at this time.     Problem List Patient Active Problem List   Diagnosis Date Noted  . Breast cancer of upper-inner quadrant of right female breast ([place]) 10/17/2015  . Hypertension 04/19/2012  . Hypothyroid 04/19/2012  . Fibroids 04/19/2012  . Stress incontinence, female 04/19/2012    Annia Friendly, PT 10/23/2015 4:18 PM  [place], [place], [address] Phone: [phone]   Fax:  [phone]  Name: DORIA FERN MRN: [id number] Date of Birth: 10-21-1965

## 2015-10-23 NOTE — Patient Instructions (Signed)

## 2015-10-28 ENCOUNTER — Telehealth: Payer: Self-pay | Admitting: *Deleted

## 2015-10-28 NOTE — Telephone Encounter (Signed)
Oncology Nurse Navigator Documentation  Oncology Nurse Navigator Flowsheets 10/17/2015 10/23/2015 10/28/2015  Navigator Location [facility]  Navigator Encounter Type Introductory phone call Clinic/[facility] Telephone;[facility] Follow-up  Telephone - - Outgoing Call;Clinic/[facility] Follow-up  Abnormal Finding Date 10/02/2015 - -  Confirmed Diagnosis Date 10/15/2015 - -  Patient Visit Type - MedOnc;RadOnc;Surgery;Initial -  Barriers/Navigation Needs Coordination of Care - -  Interventions Coordination of Care - -  Acuity Level 2 - -  Time Spent with Patient 30 > 120 15

## 2015-11-01 DIAGNOSIS — C50211 Malignant neoplasm of upper-inner quadrant of right female breast: Secondary | ICD-10-CM | POA: Diagnosis not present

## 2015-11-01 MED FILL — HYDROCHLOROTHIAZIDE 12.5 MG: 12.5 | 90 days supply | Qty: 90 | Fill #1

## 2015-11-01 MED FILL — LIVALO 2 MG TABLET: 2 | 30 days supply | Qty: 30 | Fill #6

## 2015-11-05 ENCOUNTER — Other Ambulatory Visit: Payer: Self-pay | Admitting: General Surgery

## 2015-11-05 DIAGNOSIS — C50411 Malignant neoplasm of upper-outer quadrant of right female breast: Secondary | ICD-10-CM

## 2015-11-06 MED FILL — LEVOTHYROXINE 125 MCG TAB: 125 | 30 days supply | Qty: 30 | Fill #9

## 2015-11-06 MED FILL — METFORMIN HCL ER 750 MG TAB: 750 | 30 days supply | Qty: 30 | Fill #2

## 2015-11-08 ENCOUNTER — Other Ambulatory Visit: Payer: Self-pay | Admitting: *Deleted

## 2015-11-10 ENCOUNTER — Telehealth: Payer: Self-pay | Admitting: Oncology

## 2015-11-10 NOTE — Telephone Encounter (Signed)
s.w. pt and r/s appt from 7.21 to 8.9 per pof.Marland KitchenMarland KitchenMarland KitchenMarland Kitchenpt ok and aware

## 2015-11-11 DIAGNOSIS — I10 Essential (primary) hypertension: Secondary | ICD-10-CM | POA: Diagnosis not present

## 2015-11-11 DIAGNOSIS — R7301 Impaired fasting glucose: Secondary | ICD-10-CM | POA: Diagnosis not present

## 2015-11-11 DIAGNOSIS — E890 Postprocedural hypothyroidism: Secondary | ICD-10-CM | POA: Diagnosis not present

## 2015-11-15 MED FILL — KLOR-CON M20 TABLET: 20 | 30 days supply | Qty: 60 | Fill #4

## 2015-11-18 ENCOUNTER — Other Ambulatory Visit (HOSPITAL_COMMUNITY): Payer: Self-pay | Admitting: Plastic Surgery

## 2015-11-18 DIAGNOSIS — C50211 Malignant neoplasm of upper-inner quadrant of right female breast: Secondary | ICD-10-CM | POA: Diagnosis not present

## 2015-11-20 ENCOUNTER — Other Ambulatory Visit (HOSPITAL_COMMUNITY): Payer: Self-pay | Admitting: *Deleted

## 2015-11-20 NOTE — Pre-Procedure Instructions (Signed)
Debbie [name]  11/20/2015      [place], [place] - [address] [facility]. [address] [place] [address] Phone: [phone] Fax: [phone]    Your procedure is scheduled on Thursday, November 28, 2015 at 11:00 AM.  Report to [facility] Entrance "A" Admitting Office at 9:00 AM.  Call this number if you have problems the morning of surgery: [phone]  Any questions prior to day of surgery, please call [phone] between 8 & 4 PM.   Remember:  Do not eat food or drink liquids after midnight Wednesday, 11/27/15.  Take these medicines the morning of surgery with A SIP OF WATER: Levothyroxine (Synthroid), Tamoxifen (Nolvadex), Tylenol - if needed, Zyrtec - if needed  Stop Aspirin, NSAIDS (Ibuprofen, Aleve, etc), Multivitamins and Fish Oil as of today.   Do not wear jewelry, make-up or nail polish.  Do not wear lotions, powders, or perfumes.  You may wear NOT deodorant.  Do not shave 48 hours prior to surgery.   Do not bring valuables to the hospital.  [facility] is not responsible for any belongings or valuables.  Contacts, dentures or bridgework may not be worn into surgery.  Leave your suitcase in the car.  After surgery it may be brought to your room.  For patients admitted to the hospital, discharge time will be determined by your treatment team.  Patients discharged the day of surgery will not be allowed to drive home.   Special instructions: [facility] - Preparing for Surgery  Before surgery, you can play an important role.  Because skin is not sterile, your skin needs to be as free of germs as possible.  You can reduce the number of germs on you skin by washing with CHG (chlorahexidine gluconate) soap before surgery.  CHG is an antiseptic cleaner which kills germs and bonds with the skin to continue killing germs even after washing.  Please DO NOT use if you have an allergy to CHG or antibacterial soaps.  If  your skin becomes reddened/irritated stop using the CHG and inform your nurse when you arrive at Short Stay.  Do not shave (including legs and underarms) for at least 48 hours prior to the first CHG shower.  You may shave your face.  Please follow these instructions carefully:   1.  Shower with CHG Soap the night before surgery and the                                morning of Surgery.  2.  If you choose to wash your hair, wash your hair first as usual with your       normal shampoo.  3.  After you shampoo, rinse your hair and body thoroughly to remove the                      Shampoo.  4.  Use CHG as you would any other liquid soap.  You can apply chg directly       to the skin and wash gently with scrungie or a clean washcloth.  5.  Apply the CHG Soap to your body ONLY FROM THE NECK DOWN.        Do not use on open wounds or open sores.  Avoid contact with your eyes, ears, mouth and genitals (private parts).  Wash genitals (private parts) with your normal soap.  6.  Wash thoroughly, paying special attention to the area where your surgery        will be performed.  7.  Thoroughly rinse your body with warm water from the neck down.  8.  DO NOT shower/wash with your normal soap after using and rinsing off       the CHG Soap.  9.  Pat yourself dry with a clean towel.            10.  Wear clean pajamas.            11.  Place clean sheets on your bed the night of your first shower and do not        sleep with pets.  Day of Surgery  Do not apply any lotions/deodorants the morning of surgery.  Please wear clean clothes to the hospital.   Please read over the following fact sheets that you were given. Pain Booklet, Coughing and Deep Breathing and Surgical Site Infection Prevention

## 2015-11-21 ENCOUNTER — Encounter (HOSPITAL_COMMUNITY): Payer: Self-pay

## 2015-11-21 ENCOUNTER — Encounter (HOSPITAL_COMMUNITY)
Admission: RE | Admit: 2015-11-21 | Discharge: 2015-11-21 | Disposition: A | Discharge status: Home / Self Care | Payer: 59 | Source: Ambulatory Visit | Attending: General Surgery | Admitting: General Surgery

## 2015-11-21 DIAGNOSIS — Z01812 Encounter for preprocedural laboratory examination: Secondary | ICD-10-CM | POA: Insufficient documentation

## 2015-11-21 DIAGNOSIS — Z01810 Encounter for preprocedural cardiovascular examination: Secondary | ICD-10-CM | POA: Insufficient documentation

## 2015-11-21 HISTORY — DX: Headache: R51

## 2015-11-21 HISTORY — DX: Headache, unspecified: R51.9

## 2015-11-21 HISTORY — DX: Depression, unspecified: F32.A

## 2015-11-21 HISTORY — DX: Major depressive disorder, single episode, unspecified: F32.9

## 2015-11-21 LAB — CBC
HCT: 40.9 % (ref 36.0–46.0)
Hemoglobin: 13.1 g/dL (ref 12.0–15.0)
MCH: 28.7 pg (ref 26.0–34.0)
MCHC: 32 g/dL (ref 30.0–36.0)
MCV: 89.7 fL (ref 78.0–100.0)
Platelets: 277 10*3/uL (ref 150–400)
RBC: 4.56 MIL/uL (ref 3.87–5.11)
RDW: 13.8 % (ref 11.5–15.5)
WBC: 5.4 10*3/uL (ref 4.0–10.5)

## 2015-11-21 LAB — BASIC METABOLIC PANEL
Anion gap: 7 (ref 5–15)
BUN: 10 mg/dL (ref 6–20)
CO2: 27 mmol/L (ref 22–32)
Calcium: 9.3 mg/dL (ref 8.9–10.3)
Chloride: 105 mmol/L (ref 101–111)
Creatinine, Ser: 0.95 mg/dL (ref 0.44–1.00)
GFR calc Af Amer: >60 mL/min (ref >60)
GFR calc non Af Amer: >60 mL/min (ref >60)
Glucose, Bld: 93 mg/dL (ref 65–99)
Potassium: 4 mmol/L (ref 3.5–5.1)
Sodium: 139 mmol/L (ref 135–145)

## 2015-11-21 NOTE — Progress Notes (Signed)
Lab called states blood was hemolyzed and we will need to recollect HCG on the day of surgery, new order placed for the day of surgery.

## 2015-11-21 NOTE — Progress Notes (Addendum)
PCP is Kelton Pillar Cardiologist is Dr Tamala Julian- has not seen in many years Gyn is Dr. Leo Grosser States she was told she had a heart murmur 2 and 1/2 years ago, and that some Drs hear it and some don't. States she was taking metformin for weight control.

## 2015-11-25 NOTE — Progress Notes (Signed)
Anesthesia Chart Review: Patient is a 50 year old female scheduled for breast lumpectomy with needle localization and axillary sentinel LN biopsy (Dr. Marlou Starks) and bilateral mammary reduction (Dr. Harlow Mares) on 11/28/15.  History includes non-smoker, dysrhythmia (not specified), murmur (not specified; trivial TR by 2011 echo), hypothyroidism, hypercholesterolemia, HTN, anxiety, depression. PCP is Dr. Kelton Pillar. Has seen cardiologist Dr. Daneen Schick in the past, but not in many years (not history of CAD/MI/CHF).  Meds include fish oil, Mirena IUD, levothyroxine, Livalo, losartan, metformin (on for "weight loss"), KCl.  11/21/15 EKG: NSR.  08/02/09 Echo (former [facility]): Conclusions: 1. Normal LV size and function. 2. There were no regional wall motion abnormalities. 3. LVEF estimated at 60-65%. 4. Trivial TR. 5. Mildly elevated estimated RVSP. 6. RVSP estimated at 30-40 mmHg.  Preoperative labs noted. Glucose 93. HCG hemolyzed, so plans to recollect on the day of surgery (LMP as of 11/21/15 was 10/24/15; has an Mirena IUD).  If no acute changes then I would anticipate that she could proceed as planned.  [facility] Short Stay Center/Anesthesiology Phone [phone] 11/25/2015 2:18 PM

## 2015-11-27 MED FILL — LIVALO 2 MG TABLET: 2 | 30 days supply | Qty: 30 | Fill #0

## 2015-11-28 ENCOUNTER — Ambulatory Visit (HOSPITAL_COMMUNITY)
Admission: RE | Admit: 2015-11-28 | Discharge: 2015-11-28 | Disposition: A | Discharge status: Home / Self Care | Payer: 59 | Source: Ambulatory Visit | Attending: General Surgery | Admitting: General Surgery

## 2015-11-28 ENCOUNTER — Ambulatory Visit (HOSPITAL_COMMUNITY)
Admission: RE | Admit: 2015-11-28 | Discharge: 2015-11-29 | Disposition: A | Discharge status: Home / Self Care | Payer: 59 | Source: Ambulatory Visit | Attending: General Surgery | Admitting: General Surgery

## 2015-11-28 ENCOUNTER — Ambulatory Visit
Admission: RE | Admit: 2015-11-28 | Discharge: 2015-11-28 | Disposition: A | Discharge status: Home / Self Care | Payer: 59 | Source: Ambulatory Visit | Attending: General Surgery | Admitting: General Surgery

## 2015-11-28 ENCOUNTER — Encounter (HOSPITAL_COMMUNITY): Payer: Self-pay | Admitting: Anesthesiology

## 2015-11-28 ENCOUNTER — Ambulatory Visit (HOSPITAL_COMMUNITY): Payer: 59 | Admitting: Emergency Medicine

## 2015-11-28 ENCOUNTER — Ambulatory Visit (HOSPITAL_COMMUNITY): Payer: 59 | Admitting: Anesthesiology

## 2015-11-28 ENCOUNTER — Encounter (HOSPITAL_COMMUNITY)
Admission: RE | Disposition: A | Discharge status: Home / Self Care | Payer: Self-pay | Source: Ambulatory Visit | Attending: General Surgery

## 2015-11-28 DIAGNOSIS — C50411 Malignant neoplasm of upper-outer quadrant of right female breast: Secondary | ICD-10-CM | POA: Insufficient documentation

## 2015-11-28 DIAGNOSIS — I10 Essential (primary) hypertension: Secondary | ICD-10-CM | POA: Insufficient documentation

## 2015-11-28 DIAGNOSIS — H0231 Blepharochalasis right upper eyelid: Secondary | ICD-10-CM | POA: Diagnosis not present

## 2015-11-28 DIAGNOSIS — Z853 Personal history of malignant neoplasm of breast: Secondary | ICD-10-CM | POA: Diagnosis not present

## 2015-11-28 DIAGNOSIS — C50912 Malignant neoplasm of unspecified site of left female breast: Secondary | ICD-10-CM | POA: Diagnosis not present

## 2015-11-28 DIAGNOSIS — R921 Mammographic calcification found on diagnostic imaging of breast: Secondary | ICD-10-CM | POA: Diagnosis not present

## 2015-11-28 DIAGNOSIS — F329 Major depressive disorder, single episode, unspecified: Secondary | ICD-10-CM | POA: Diagnosis not present

## 2015-11-28 DIAGNOSIS — E039 Hypothyroidism, unspecified: Secondary | ICD-10-CM | POA: Insufficient documentation

## 2015-11-28 DIAGNOSIS — H0234 Blepharochalasis left upper eyelid: Secondary | ICD-10-CM | POA: Diagnosis not present

## 2015-11-28 DIAGNOSIS — Z170 Estrogen receptor positive status [ER+]: Secondary | ICD-10-CM | POA: Diagnosis present

## 2015-11-28 DIAGNOSIS — C50911 Malignant neoplasm of unspecified site of right female breast: Secondary | ICD-10-CM | POA: Diagnosis not present

## 2015-11-28 DIAGNOSIS — C50211 Malignant neoplasm of upper-inner quadrant of right female breast: Secondary | ICD-10-CM | POA: Diagnosis not present

## 2015-11-28 DIAGNOSIS — C50112 Malignant neoplasm of central portion of left female breast: Secondary | ICD-10-CM | POA: Diagnosis present

## 2015-11-28 DIAGNOSIS — F419 Anxiety disorder, unspecified: Secondary | ICD-10-CM | POA: Insufficient documentation

## 2015-11-28 DIAGNOSIS — C773 Secondary and unspecified malignant neoplasm of axilla and upper limb lymph nodes: Secondary | ICD-10-CM | POA: Diagnosis not present

## 2015-11-28 HISTORY — DX: Family history of other specified conditions: Z84.89

## 2015-11-28 HISTORY — PX: BREAST LUMPECTOMY WITH NEEDLE LOCALIZATION AND AXILLARY SENTINEL LYMPH NODE BX: SHX5760

## 2015-11-28 HISTORY — PX: BREAST REDUCTION SURGERY: SHX8

## 2015-11-28 LAB — GLUCOSE, CAPILLARY
Glucose-Capillary: 136 mg/dL — ABNORMAL HIGH (ref 65–99)
Glucose-Capillary: 165 mg/dL — ABNORMAL HIGH (ref 65–99)

## 2015-11-28 SURGERY — BREAST LUMPECTOMY WITH NEEDLE LOCALIZATION AND AXILLARY SENTINEL LYMPH NODE BX
Anesthesia: General | Site: Breast | Laterality: Right

## 2015-11-28 MED ORDER — LOSARTAN POTASSIUM 50 MG PO TABS
100.0000 mg | ORAL_TABLET | Freq: Every day | ORAL | Status: DC
  Administered 2015-11-28 – 2015-11-29 (×2): 100 mg via ORAL
  Filled 2015-11-28 (×3): qty 2

## 2015-11-28 MED ORDER — PROMETHAZINE HCL 25 MG/ML IJ SOLN: 6.2500 mg | INTRAMUSCULAR | Status: DC | PRN

## 2015-11-28 MED ORDER — METHOCARBAMOL 500 MG PO TABS
500.0000 mg | ORAL_TABLET | Freq: Four times a day (QID) | ORAL | Status: DC | PRN
  Administered 2015-11-28: 500 mg via ORAL
  Filled 2015-11-28: qty 1

## 2015-11-28 MED ORDER — FENTANYL CITRATE (PF) 100 MCG/2ML IJ SOLN
INTRAMUSCULAR | Status: DC | PRN
  Administered 2015-11-28: 100 ug via INTRAVENOUS
  Administered 2015-11-28 (×3): 50 ug via INTRAVENOUS

## 2015-11-28 MED ORDER — ACETAMINOPHEN 325 MG PO TABS
650.0000 mg | ORAL_TABLET | Freq: Four times a day (QID) | ORAL | Status: DC | PRN
  Administered 2015-11-28: 650 mg via ORAL
  Filled 2015-11-28: qty 2

## 2015-11-28 MED ORDER — HYDROMORPHONE HCL 1 MG/ML IJ SOLN
INTRAMUSCULAR | Status: AC
  Filled 2015-11-28: qty 1

## 2015-11-28 MED ORDER — SODIUM CHLORIDE 0.45 % IV SOLN
INTRAVENOUS | Status: DC
  Administered 2015-11-28 – 2015-11-29 (×2): via INTRAVENOUS

## 2015-11-28 MED ORDER — 0.9 % SODIUM CHLORIDE (POUR BTL) OPTIME
TOPICAL | Status: DC | PRN
  Administered 2015-11-28 (×2): 1000 mL

## 2015-11-28 MED ORDER — METOPROLOL TARTRATE 5 MG/5ML IV SOLN
INTRAVENOUS | Status: DC | PRN
  Administered 2015-11-28: 2.5 mg via INTRAVENOUS

## 2015-11-28 MED ORDER — DOCUSATE SODIUM 100 MG PO CAPS
100.0000 mg | ORAL_CAPSULE | Freq: Every day | ORAL | Status: DC
  Administered 2015-11-28 – 2015-11-29 (×2): 100 mg via ORAL
  Filled 2015-11-28 (×2): qty 1

## 2015-11-28 MED ORDER — METFORMIN HCL ER 750 MG PO TB24
750.0000 mg | ORAL_TABLET | Freq: Every day | ORAL | Status: DC
  Administered 2015-11-28: 750 mg via ORAL
  Filled 2015-11-28: qty 1.5

## 2015-11-28 MED ORDER — CHLORHEXIDINE GLUCONATE CLOTH 2 % EX PADS: 6.0000 | MEDICATED_PAD | Freq: Once | CUTANEOUS | Status: DC

## 2015-11-28 MED ORDER — POTASSIUM CHLORIDE CRYS ER 20 MEQ PO TBCR
40.0000 meq | EXTENDED_RELEASE_TABLET | Freq: Two times a day (BID) | ORAL | Status: DC
  Administered 2015-11-29: 40 meq via ORAL
  Filled 2015-11-28: qty 2

## 2015-11-28 MED ORDER — PROPOFOL 10 MG/ML IV BOLUS
INTRAVENOUS | Status: DC | PRN
  Administered 2015-11-28: 200 mg via INTRAVENOUS

## 2015-11-28 MED ORDER — ONDANSETRON HCL 4 MG/2ML IJ SOLN
4.0000 mg | Freq: Four times a day (QID) | INTRAMUSCULAR | Status: DC | PRN
  Administered 2015-11-28: 4 mg via INTRAVENOUS
  Filled 2015-11-28: qty 2

## 2015-11-28 MED ORDER — MIDAZOLAM HCL 2 MG/2ML IJ SOLN
INTRAMUSCULAR | Status: AC
  Administered 2015-11-28: 1 mg
  Filled 2015-11-28: qty 2

## 2015-11-28 MED ORDER — INSULIN ASPART 100 UNIT/ML ~~LOC~~ SOLN
0.0000 [IU] | Freq: Three times a day (TID) | SUBCUTANEOUS | Status: DC
  Administered 2015-11-28: 3 [IU] via SUBCUTANEOUS

## 2015-11-28 MED ORDER — PHENYLEPHRINE HCL 10 MG/ML IJ SOLN
INTRAMUSCULAR | Status: DC | PRN
  Administered 2015-11-28: 80 ug via INTRAVENOUS
  Administered 2015-11-28: 40 ug via INTRAVENOUS
  Administered 2015-11-28: 80 ug via INTRAVENOUS
  Administered 2015-11-28 (×2): 40 ug via INTRAVENOUS

## 2015-11-28 MED ORDER — INSULIN ASPART 100 UNIT/ML ~~LOC~~ SOLN: 0.0000 [IU] | Freq: Every day | SUBCUTANEOUS | Status: DC

## 2015-11-28 MED ORDER — MEPERIDINE HCL 25 MG/ML IJ SOLN: 6.2500 mg | INTRAMUSCULAR | Status: DC | PRN

## 2015-11-28 MED ORDER — LEVOTHYROXINE SODIUM 25 MCG PO TABS
125.0000 ug | ORAL_TABLET | Freq: Every day | ORAL | Status: DC
  Administered 2015-11-29: 125 ug via ORAL
  Filled 2015-11-28: qty 1

## 2015-11-28 MED ORDER — PROMETHAZINE HCL 25 MG/ML IJ SOLN
6.2500 mg | INTRAMUSCULAR | Status: DC | PRN
  Administered 2015-11-28: 6.25 mg via INTRAVENOUS
  Filled 2015-11-28: qty 1

## 2015-11-28 MED ORDER — BUPIVACAINE-EPINEPHRINE (PF) 0.25% -1:200000 IJ SOLN
INTRAMUSCULAR | Status: AC
  Filled 2015-11-28: qty 30

## 2015-11-28 MED ORDER — CEFAZOLIN SODIUM 1 G IJ SOLR
INTRAMUSCULAR | Status: AC
  Filled 2015-11-28: qty 20

## 2015-11-28 MED ORDER — HYDROMORPHONE HCL 1 MG/ML IJ SOLN
0.2500 mg | INTRAMUSCULAR | Status: DC | PRN
  Administered 2015-11-28 (×3): 0.5 mg via INTRAVENOUS
  Administered 2015-11-28 (×2): 0.25 mg via INTRAVENOUS

## 2015-11-28 MED ORDER — DEXAMETHASONE SODIUM PHOSPHATE 4 MG/ML IJ SOLN
INTRAMUSCULAR | Status: DC | PRN
  Administered 2015-11-28: 10 mg via INTRAVENOUS

## 2015-11-28 MED ORDER — HEPARIN SODIUM (PORCINE) 5000 UNIT/ML IJ SOLN
INTRAMUSCULAR | Status: AC
  Administered 2015-11-28: 5000 [IU] via SUBCUTANEOUS
  Filled 2015-11-28: qty 1

## 2015-11-28 MED ORDER — LIDOCAINE HCL (CARDIAC) 20 MG/ML IV SOLN
INTRAVENOUS | Status: DC | PRN
  Administered 2015-11-28: 100 mg via INTRAVENOUS

## 2015-11-28 MED ORDER — HYDROMORPHONE HCL 2 MG PO TABS
2.0000 mg | ORAL_TABLET | ORAL | Status: DC | PRN
  Administered 2015-11-28 – 2015-11-29 (×3): 2 mg via ORAL
  Filled 2015-11-28 (×3): qty 1

## 2015-11-28 MED ORDER — HEPARIN SODIUM (PORCINE) 5000 UNIT/ML IJ SOLN
5000.0000 [IU] | Freq: Once | INTRAMUSCULAR | Status: AC
  Administered 2015-11-28: 5000 [IU] via SUBCUTANEOUS

## 2015-11-28 MED ORDER — FENTANYL CITRATE (PF) 100 MCG/2ML IJ SOLN
INTRAMUSCULAR | Status: AC
  Administered 2015-11-28: 50 ug
  Filled 2015-11-28: qty 2

## 2015-11-28 MED ORDER — CEFAZOLIN SODIUM-DEXTROSE 2-4 GM/100ML-% IV SOLN
2.0000 g | INTRAVENOUS | Status: AC
  Administered 2015-11-28: 2 g via INTRAVENOUS

## 2015-11-28 MED ORDER — DEXAMETHASONE SODIUM PHOSPHATE 10 MG/ML IJ SOLN
INTRAMUSCULAR | Status: AC
  Filled 2015-11-28: qty 1

## 2015-11-28 MED ORDER — ONDANSETRON HCL 4 MG/2ML IJ SOLN
INTRAMUSCULAR | Status: AC
  Filled 2015-11-28: qty 2

## 2015-11-28 MED ORDER — LIDOCAINE 2% (20 MG/ML) 5 ML SYRINGE
INTRAMUSCULAR | Status: AC
  Filled 2015-11-28: qty 5

## 2015-11-28 MED ORDER — ROCURONIUM 10MG/ML (10ML) SYRINGE FOR MEDFUSION PUMP - OPTIME
INTRAVENOUS | Status: DC | PRN
  Administered 2015-11-28: 50 mg via INTRAVENOUS

## 2015-11-28 MED ORDER — MIDAZOLAM HCL 5 MG/5ML IJ SOLN
INTRAMUSCULAR | Status: DC | PRN
  Administered 2015-11-28: 1 mg via INTRAVENOUS

## 2015-11-28 MED ORDER — CEFAZOLIN SODIUM-DEXTROSE 2-4 GM/100ML-% IV SOLN
INTRAVENOUS | Status: AC
  Administered 2015-11-28: 2 g via INTRAVENOUS
  Filled 2015-11-28: qty 100

## 2015-11-28 MED ORDER — TECHNETIUM TC 99M SULFUR COLLOID FILTERED
1.0000 | Freq: Once | INTRAVENOUS | Status: AC | PRN
  Administered 2015-11-28: 1 via INTRADERMAL

## 2015-11-28 MED ORDER — SODIUM CHLORIDE 0.9 % IJ SOLN
INTRAMUSCULAR | Status: AC
  Filled 2015-11-28: qty 10

## 2015-11-28 MED ORDER — METFORMIN HCL ER 500 MG PO TB24
750.0000 mg | ORAL_TABLET | Freq: Every day | ORAL | Status: DC
  Filled 2015-11-28: qty 1.5

## 2015-11-28 MED ORDER — HYDROMORPHONE HCL 1 MG/ML IJ SOLN: 0.5000 mg | INTRAMUSCULAR | Status: DC | PRN

## 2015-11-28 MED ORDER — LACTATED RINGERS IV SOLN
INTRAVENOUS | Status: DC | PRN
  Administered 2015-11-28: 1000 mL
  Administered 2015-11-28 (×2): via INTRAVENOUS

## 2015-11-28 MED ORDER — CEFAZOLIN SODIUM-DEXTROSE 2-4 GM/100ML-% IV SOLN
2.0000 g | Freq: Four times a day (QID) | INTRAVENOUS | Status: DC
  Administered 2015-11-28 – 2015-11-29 (×2): 2 g via INTRAVENOUS
  Filled 2015-11-28 (×4): qty 100

## 2015-11-28 MED ORDER — HYDROCHLOROTHIAZIDE 12.5 MG PO CAPS
12.5000 mg | ORAL_CAPSULE | Freq: Every day | ORAL | Status: DC
  Administered 2015-11-28 – 2015-11-29 (×2): 12.5 mg via ORAL
  Filled 2015-11-28 (×2): qty 1

## 2015-11-28 MED ORDER — FENTANYL CITRATE (PF) 250 MCG/5ML IJ SOLN
INTRAMUSCULAR | Status: AC
  Filled 2015-11-28: qty 5

## 2015-11-28 MED ORDER — PROPOFOL 10 MG/ML IV BOLUS
INTRAVENOUS | Status: AC
  Filled 2015-11-28: qty 20

## 2015-11-28 MED ORDER — BUPIVACAINE-EPINEPHRINE 0.25% -1:200000 IJ SOLN
INTRAMUSCULAR | Status: DC | PRN
  Administered 2015-11-28: 30 mL

## 2015-11-28 MED ORDER — METHYLENE BLUE 0.5 % INJ SOLN
INTRAVENOUS | Status: AC
  Filled 2015-11-28: qty 10

## 2015-11-28 SURGICAL SUPPLY — 92 items
ADH SKN CLS APL DERMABOND .7 (GAUZE/BANDAGES/DRESSINGS) ×2
APPLIER CLIP 9.375 MED OPEN (MISCELLANEOUS)
APR CLP MED 9.3 20 MLT OPN (MISCELLANEOUS)
ATCH SMKEVC FLXB CAUT HNDSWH (FILTER) ×2 IMPLANT
BALL CTTN LRG ABS STRL LF (GAUZE/BANDAGES/DRESSINGS)
BANDAGE ACE 6X5 VEL STRL LF (GAUZE/BANDAGES/DRESSINGS) ×3 IMPLANT
BINDER BREAST LRG (GAUZE/BANDAGES/DRESSINGS) IMPLANT
BINDER BREAST XLRG (GAUZE/BANDAGES/DRESSINGS) IMPLANT
BINDER BREAST XXLRG (GAUZE/BANDAGES/DRESSINGS) ×2 IMPLANT
BNDG CMPR MED 15X6 ELC VLCR LF (GAUZE/BANDAGES/DRESSINGS) ×2
BNDG ELASTIC 6X15 VLCR STRL LF (GAUZE/BANDAGES/DRESSINGS) ×1 IMPLANT
CANISTER SUCTION 2500CC (MISCELLANEOUS) ×6 IMPLANT
CHLORAPREP W/TINT 26ML (MISCELLANEOUS) ×6 IMPLANT
CLIP APPLIE 9.375 MED OPEN (MISCELLANEOUS) IMPLANT
CONT SPEC 4OZ CLIKSEAL STRL BL (MISCELLANEOUS) ×6 IMPLANT
COTTONBALL LRG STERILE PKG (GAUZE/BANDAGES/DRESSINGS) IMPLANT
COVER PROBE W GEL 5X96 (DRAPES) ×3 IMPLANT
COVER SURGICAL LIGHT HANDLE (MISCELLANEOUS) ×5 IMPLANT
DERMABOND ADVANCED (GAUZE/BANDAGES/DRESSINGS) ×1
DERMABOND ADVANCED .7 DNX12 (GAUZE/BANDAGES/DRESSINGS) ×2 IMPLANT
DEVICE DUBIN SPECIMEN MAMMOGRA (MISCELLANEOUS) ×3 IMPLANT
DRAIN CHANNEL 19F RND (DRAIN) ×2 IMPLANT
DRAPE CHEST BREAST 15X10 FENES (DRAPES) ×3 IMPLANT
DRAPE ORTHO SPLIT 77X108 STRL (DRAPES) ×6
DRAPE PROXIMA HALF (DRAPES) ×6 IMPLANT
DRAPE SURG ORHT 6 SPLT 77X108 (DRAPES) ×4 IMPLANT
DRAPE UTILITY XL STRL (DRAPES) ×5 IMPLANT
DRAPE WARM FLUID 44X44 (DRAPE) ×3 IMPLANT
DRSG PAD ABDOMINAL 8X10 ST (GAUZE/BANDAGES/DRESSINGS) ×5 IMPLANT
DRSG TEGADERM 4X4.75 (GAUZE/BANDAGES/DRESSINGS) ×2 IMPLANT
ELECT BLADE 4.0 EZ CLEAN MEGAD (MISCELLANEOUS) ×3
ELECT CAUTERY BLADE 6.4 (BLADE) ×3 IMPLANT
ELECT COATED BLADE 2.86 ST (ELECTRODE) ×3 IMPLANT
ELECT REM PT RETURN 9FT ADLT (ELECTROSURGICAL) ×6
ELECTRODE BLDE 4.0 EZ CLN MEGD (MISCELLANEOUS) IMPLANT
ELECTRODE REM PT RTRN 9FT ADLT (ELECTROSURGICAL) ×4 IMPLANT
EVACUATOR SILICONE 100CC (DRAIN) ×2 IMPLANT
EVACUATOR SMOKE ACCUVAC VALLEY (FILTER) ×1
GAUZE SPONGE 4X4 12PLY STRL (GAUZE/BANDAGES/DRESSINGS) ×1 IMPLANT
GLOVE BIO SURGEON STRL SZ7.5 (GLOVE) ×8 IMPLANT
GLOVE BIOGEL PI IND STRL 7.0 (GLOVE) IMPLANT
GLOVE BIOGEL PI IND STRL 7.5 (GLOVE) IMPLANT
GLOVE BIOGEL PI IND STRL 8 (GLOVE) ×2 IMPLANT
GLOVE BIOGEL PI INDICATOR 7.0 (GLOVE) ×3
GLOVE BIOGEL PI INDICATOR 7.5 (GLOVE) ×2
GLOVE BIOGEL PI INDICATOR 8 (GLOVE) ×1
GLOVE ECLIPSE 7.5 STRL STRAW (GLOVE) ×2 IMPLANT
GLOVE SURG SS PI 7.0 STRL IVOR (GLOVE) ×6 IMPLANT
GOWN STRL REUS W/ TWL LRG LVL3 (GOWN DISPOSABLE) ×6 IMPLANT
GOWN STRL REUS W/ TWL XL LVL3 (GOWN DISPOSABLE) ×2 IMPLANT
GOWN STRL REUS W/TWL LRG LVL3 (GOWN DISPOSABLE) ×18
GOWN STRL REUS W/TWL XL LVL3 (GOWN DISPOSABLE) ×3
KIT BASIN OR (CUSTOM PROCEDURE TRAY) ×6 IMPLANT
KIT MARKER MARGIN INK (KITS) ×1 IMPLANT
KIT ROOM TURNOVER OR (KITS) ×6 IMPLANT
LIQUID BAND (GAUZE/BANDAGES/DRESSINGS) ×2 IMPLANT
MARKER SKIN DUAL TIP RULER LAB (MISCELLANEOUS) ×3 IMPLANT
NDL 18GX1X1/2 (RX/OR ONLY) (NEEDLE) ×2 IMPLANT
NDL FILTER BLUNT 18X1 1/2 (NEEDLE) IMPLANT
NDL HYPO 25GX1X1/2 BEV (NEEDLE) ×4 IMPLANT
NEEDLE 18GX1X1/2 (RX/OR ONLY) (NEEDLE) ×3 IMPLANT
NEEDLE FILTER BLUNT 18X 1/2SAF (NEEDLE)
NEEDLE FILTER BLUNT 18X1 1/2 (NEEDLE) IMPLANT
NEEDLE HYPO 25GX1X1/2 BEV (NEEDLE) ×3 IMPLANT
NS IRRIG 1000ML POUR BTL (IV SOLUTION) ×9 IMPLANT
PACK GENERAL/GYN (CUSTOM PROCEDURE TRAY) ×3 IMPLANT
PACK SURGICAL SETUP 50X90 (CUSTOM PROCEDURE TRAY) ×2 IMPLANT
PAD ABD 8X10 STRL (GAUZE/BANDAGES/DRESSINGS) ×6 IMPLANT
PAD ARMBOARD 7.5X6 YLW CONV (MISCELLANEOUS) ×6 IMPLANT
PENCIL BUTTON HOLSTER BLD 10FT (ELECTRODE) ×3 IMPLANT
PIN SAFETY STERILE (MISCELLANEOUS) ×1 IMPLANT
PREFILTER EVAC NS 1 1/3-3/8IN (MISCELLANEOUS) ×3 IMPLANT
SPECIMEN JAR X LARGE (MISCELLANEOUS) ×2 IMPLANT
SPONGE LAP 18X18 X RAY DECT (DISPOSABLE) ×5 IMPLANT
SUT MNCRL AB 3-0 PS2 18 (SUTURE) ×15 IMPLANT
SUT MNCRL AB 4-0 PS2 18 (SUTURE) ×11 IMPLANT
SUT MON AB 2-0 CT1 36 (SUTURE) ×6 IMPLANT
SUT MON AB 5-0 PS2 18 (SUTURE) ×1 IMPLANT
SUT PROLENE 3 0 PS 2 (SUTURE) ×5 IMPLANT
SUT PROLENE 5 0 PS 2 (SUTURE) ×4 IMPLANT
SUT SILK 2 0 SH (SUTURE) IMPLANT
SUT SILK 4 0 PS 2 (SUTURE) ×3 IMPLANT
SUT VIC AB 3-0 54X BRD REEL (SUTURE) ×2 IMPLANT
SUT VIC AB 3-0 BRD 54 (SUTURE)
SUT VIC AB 3-0 SH 18 (SUTURE) ×6 IMPLANT
SYR BULB 3OZ (MISCELLANEOUS) ×3 IMPLANT
SYR CONTROL 10ML LL (SYRINGE) ×5 IMPLANT
TOWEL OR 17X24 6PK STRL BLUE (TOWEL DISPOSABLE) ×5 IMPLANT
TOWEL OR 17X26 10 PK STRL BLUE (TOWEL DISPOSABLE) ×5 IMPLANT
TRAY FOLEY CATH 14FRSI W/METER (CATHETERS) ×1 IMPLANT
TUBE CONNECTING 12X1/4 (SUCTIONS) ×7 IMPLANT
YANKAUER SUCT BULB TIP NO VENT (SUCTIONS) ×4 IMPLANT

## 2015-11-28 NOTE — Transfer of Care (Signed)
Immediate Anesthesia Transfer of Care Note  Patient: Debbie [name]  Procedure(s) Performed: Procedure(s): RIGHT BREAST LUMPECTOMY WITH NEEDLE LOCALIZATION AND AXILLARY SENTINEL LYMPH NODE BX (Right) MAMMARY REDUCTION  (BREAST) BILATERAL (Bilateral)  Patient Location: PACU  Anesthesia Type:General  Level of Consciousness: awake, alert , oriented and patient cooperative  Airway & Oxygen Therapy: Patient Spontanous Breathing and Patient connected to nasal cannula oxygen  Post-op Assessment: Report given to RN and Post -op Vital signs reviewed and stable  Post vital signs: Reviewed and stable  Last Vitals:  Filed Vitals:   11/28/15 1045 11/28/15 1051  BP: 159/85 160/84  Pulse: 82   Temp:    Resp: 18 14    Last Pain: There were no vitals filed for this visit.    Patients Stated Pain Goal: 4 ([id number])  Complications: No apparent anesthesia complications

## 2015-11-28 NOTE — H&P (Signed)
Debbie [name]. [place]  Location: [facility] Patient #: [id number] DOB: 1965-08-14 Undefined / Language: Cleophus Molt / Race: White Female   History of Present Illness  The patient is a 50 year old female who presents with breast cancer. We are asked to see the patient in consultation by Dr. [name] to evaluate her for a new right breast cancer. The patient is a 50 year old black female who presents after a routine screening mammogram showed an area of abnormal calcification in the right breast measuring about 5cm. This was biopsied and came back as grade 2 invasive ductal cancer. She was ER and PR positive and Her2 negative with a Ki67 of 70%. She occasionally has breast tenderness but no nipple discharge. She does take hormone replacement which she has stopped but also has a Mirena in. She is very large breasted and has been interested in breast reduction.   Medication History Medications Reconciled    Review of Systems  General Not Present- Appetite Loss, Chills, Fatigue, Fever, Night Sweats, Weight Gain and Weight Loss. Skin Not Present- Change in Wart/Mole, Dryness, Hives, Jaundice, New Lesions, Non-Healing Wounds, Rash and Ulcer. HEENT Not Present- Earache, Hearing Loss, Hoarseness, Nose Bleed, Oral Ulcers, Ringing in the Ears, Seasonal Allergies, Sinus Pain, Sore Throat, Visual Disturbances, Wears glasses/contact lenses and Yellow Eyes. Respiratory Not Present- Bloody sputum, Chronic Cough, Difficulty Breathing, Snoring and Wheezing. Breast Not Present- Breast Mass, Breast Pain, Nipple Discharge and Skin Changes. Cardiovascular Not Present- Chest Pain, Difficulty Breathing Lying Down, Leg Cramps, Palpitations, Rapid Heart Rate, Shortness of Breath and Swelling of Extremities. Gastrointestinal Not Present- Abdominal Pain, Bloating, Bloody Stool, Change in Bowel Habits, Chronic diarrhea, Constipation, Difficulty Swallowing, Excessive gas, Gets full quickly at meals, Hemorrhoids,  Indigestion, Nausea, Rectal Pain and Vomiting. Female Genitourinary Not Present- Frequency, Nocturia, Painful Urination, Pelvic Pain and Urgency. Musculoskeletal Not Present- Back Pain, Joint Pain, Joint Stiffness, Muscle Pain, Muscle Weakness and Swelling of Extremities. Neurological Not Present- Decreased Memory, Fainting, Headaches, Numbness, Seizures, Tingling, Tremor, Trouble walking and Weakness. Psychiatric Not Present- Anxiety, Bipolar, Change in Sleep Pattern, Depression, Fearful and Frequent crying. Endocrine Not Present- Cold Intolerance, Excessive Hunger, Hair Changes, Heat Intolerance, Hot flashes and New Diabetes. Hematology Not Present- Easy Bruising, Excessive bleeding, Gland problems, HIV and Persistent Infections.   Physical Exam  General Mental Status-Alert. General Appearance-Consistent with stated age. Hydration-Well hydrated. Voice-Normal.  Head and Neck Head-normocephalic, atraumatic with no lesions or palpable masses. Trachea-midline. Thyroid Gland Characteristics - normal size and consistency.  Eye Eyeball - Bilateral-Extraocular movements intact. Sclera/Conjunctiva - Bilateral-No scleral icterus.  Chest and Lung Exam Chest and lung exam reveals -quiet, even and easy respiratory effort with no use of accessory muscles and on auscultation, normal breath sounds, no adventitious sounds and normal vocal resonance. Inspection Chest Wall - Normal. Back - normal.  Breast Note: There is no palpable mass in either breast. There is no palpable axillary, supraclavicular, or cervical lymphadenopathy   Cardiovascular Cardiovascular examination reveals -normal heart sounds, regular rate and rhythm with no murmurs and normal pedal pulses bilaterally.  Abdomen Inspection Inspection of the abdomen reveals - No Hernias. Skin - Scar - no surgical scars. Palpation/Percussion Palpation and Percussion of the abdomen reveal - Soft, Non Tender, No  Rebound tenderness, No Rigidity (guarding) and No hepatosplenomegaly. Auscultation Auscultation of the abdomen reveals - Bowel sounds normal.  Neurologic Neurologic evaluation reveals -alert and oriented x 3 with no impairment of recent or remote memory. Mental Status-Normal.  Musculoskeletal Normal Exam - Left-Upper  Extremity Strength Normal and Lower Extremity Strength Normal. Normal Exam - Right-Upper Extremity Strength Normal and Lower Extremity Strength Normal.  Lymphatic Head & Neck  General Head & Neck Lymphatics: Bilateral - Description - Normal. Axillary  General Axillary Region: Bilateral - Description - Normal. Tenderness - Non Tender. Femoral & Inguinal  Generalized Femoral & Inguinal Lymphatics: Bilateral - Description - Normal. Tenderness - Non Tender.    Assessment & Plan  BREAST CANCER OF UPPER-OUTER QUADRANT OF RIGHT FEMALE BREAST (C50.411) Impression: The patient appears to have a stage 2A cancer in the upper outer right breast. I have talked to her in detail about the options for treatment and at this point she favors breast conservation. She would be very interested in a reduction lumpectomy if this is possible. She would also need a sentinel node mapping as well. We will refer her to Plastic Surgery to discuss the options and then proceed. I have discussed with her the risks and benefits of the surgery as well as some of the technical aspects and she understands and wishes to proceed. Current Plans Pt Education - Breast Cancer: discussed with patient and provided information.

## 2015-11-28 NOTE — Op Note (Signed)
11/28/2015  2:40 PM  PATIENT:  Debbie [name]  50 y.o. female  PRE-OPERATIVE DIAGNOSIS:  RIGHT BREAST CANCER  POST-OPERATIVE DIAGNOSIS:  RIGHT BREAST CANCER  PROCEDURE:  Procedure(s): RIGHT BREAST LUMPECTOMY WITH NEEDLE LOCALIZATION AND AXILLARY SENTINEL LYMPH NODE BX (Right)  SURGEON:  Surgeon(s) and Role: Panel 1:    * Jovita Kussmaul, MD - Primary  Panel 2:    * Crissie Reese, MD - Primary  PHYSICIAN ASSISTANT:   ASSISTANTS: none  ANESTHESIA:   general  EBL:  Total I/O In: 1400 [I.V.:1400] Out: 61 [Urine:160; Blood:100]  BLOOD ADMINISTERED:none  DRAINS: none   LOCAL MEDICATIONS USED:  NONE  SPECIMEN:  Source of Specimen:  right breast tissue with additional medial and anterior margins  DISPOSITION OF SPECIMEN:  PATHOLOGY  COUNTS:  YES  TOURNIQUET:  * No tourniquets in log *  DICTATION: .Dragon Dictation   After informed consent was obtained the patient was brought to the operating room and placed in the supine position on the operating table. After adequate induction of general anesthesia the patient's bilateral chest, breast, and axillary areas were prepped with ChloraPrep, allowed to dry, and draped in usual sterile manner. An appropriate timeout was performed. Dr. Harlow Mares performed bilateral breast reduction surgery. Earlier in the day the patient underwent wire localization with 2 bracketing wires of the area in question in the upper inner right breast. Also earlier in the day the patient underwent injection of 1 mCi of technetium sulfur colloid in the subareolar position. At this point the neoprobe was used to examine the right axilla. There was a hot spot identified. A small transversely oriented incision was made with a 15 blade knife overlying the hot spot.The incision was carried through the skin and subcutaneous tissue sharply with the electrocautery until the axilla was entered. A [place] retractor was deployed. Using the neoprobe to direct blunt  hemostat dissection I was able to identify a hot lymph node. This lymph node was excised sharply with the electrocautery and the lymphatics were controlled with clips. Ex vivo counts on this node were approximately 300. There were no other hot or palpable lymph nodes in the right axilla. This was sent as sentinel node #1. The deep layer of the wound was then closed with interrupted 3-0 Vicryl stitches. The skin was closed with a running 4-0 Monocryl subcuticular stitch. Attention was then turned to the right breast. Dr. Harlow Mares performed the breast reduction. When the dissection reached the upper inner quadrant then I  Removed the breast tissue that was located between the wires. The dissection was carried all the way to the chest wall. Once the specimen was removed it was oriented with the appropriate paint colors. A specimen radiograph was obtained that showed the wires and calcifications to be within the specimen. An additional anterior and medial margin were taken based off of the appearance of the specimen radiograph. At this point the operation for the reduction was turned back over to Dr. Harlow Mares. His portion will be dictated separately. The patient tolerated the procedure well. At the end of the case all needle sponge and instrument counts were correct. The patient was in stable condition.  PLAN OF CARE: Admit for overnight observation  PATIENT DISPOSITION:  PACU - hemodynamically stable.   Delay start of Pharmacological VTE agent (>24hrs) due to surgical blood loss or risk of bleeding: no

## 2015-11-28 NOTE — H&P (Signed)
I have re-examined and re-evaluated the patient and there are no changes.  See office notes in paper chart for H&P. 

## 2015-11-28 NOTE — Anesthesia Preprocedure Evaluation (Signed)
Anesthesia Evaluation  Patient identified by MRN, date of birth, ID band Patient awake    Reviewed: Allergy & Precautions, NPO status , Patient's Chart, lab work & pertinent test results  Airway Mallampati: II  TM Distance: >3 FB Neck ROM: Full    Dental no notable dental hx.    Pulmonary neg pulmonary ROS,    Pulmonary exam normal breath sounds clear to auscultation       Cardiovascular hypertension, Normal cardiovascular exam+ dysrhythmias + Valvular Problems/Murmurs  Rhythm:Regular Rate:Normal     Neuro/Psych  Headaches, PSYCHIATRIC DISORDERS Anxiety Depression    GI/Hepatic negative GI ROS, Neg liver ROS,   Endo/Other  Hypothyroidism Morbid obesity  Renal/GU negative Renal ROS     Musculoskeletal negative musculoskeletal ROS (+)   Abdominal (+) + obese,   Peds  Hematology negative hematology ROS (+)   Anesthesia Other Findings   Reproductive/Obstetrics negative OB ROS                             Anesthesia Physical Anesthesia Plan  ASA: III  Anesthesia Plan: General   Post-op Pain Management:    Induction: Intravenous  Airway Management Planned: Oral ETT  Additional Equipment:   Intra-op Plan:   Post-operative Plan: Extubation in OR  Informed Consent: I have reviewed the patients History and Physical, chart, labs and discussed the procedure including the risks, benefits and alternatives for the proposed anesthesia with the patient or authorized representative who has indicated his/her understanding and acceptance.   Dental advisory given  Plan Discussed with: CRNA  Anesthesia Plan Comments:         Anesthesia Quick Evaluation

## 2015-11-28 NOTE — Interval H&P Note (Signed)
History and Physical Interval Note:  11/28/2015 12:41 PM  Debbie [name]  has presented today for surgery, with the diagnosis of RIGHT BREAST CANCER  The various methods of treatment have been discussed with the patient and family. After consideration of risks, benefits and other options for treatment, the patient has consented to  Procedure(s): RIGHT BREAST LUMPECTOMY WITH NEEDLE LOCALIZATION AND AXILLARY SENTINEL LYMPH NODE BX (Right) MAMMARY REDUCTION  (BREAST) BILATERAL (Bilateral) as a surgical intervention .  The patient's history has been reviewed, patient examined, no change in status, stable for surgery.  I have reviewed the patient's chart and labs.  Questions were answered to the patient's satisfaction.     TOTH III,[name] S

## 2015-11-28 NOTE — Anesthesia Procedure Notes (Signed)
Procedure Name: Intubation Date/Time: 11/28/2015 11:08 AM Performed by: Lieutenant Diego Pre-anesthesia Checklist: Patient identified, Emergency Drugs available, Suction available and Patient being monitored Patient Re-evaluated:Patient Re-evaluated prior to inductionOxygen Delivery Method: Circle system utilized Preoxygenation: Pre-oxygenation with 100% oxygen Intubation Type: IV induction Ventilation: Mask ventilation without difficulty Laryngoscope Size: Miller and 2 Grade View: Grade I Tube type: Oral Tube size: 7.0 mm Number of attempts: 1 Airway Equipment and Method: Stylet and Oral airway Placement Confirmation: ETT inserted through vocal cords under direct vision,  positive ETCO2 and breath sounds checked- equal and bilateral Secured at: 21 cm Tube secured with: Tape Dental Injury: Teeth and Oropharynx as per pre-operative assessment

## 2015-11-28 NOTE — Progress Notes (Signed)
CRNA at bedside preparing to take pt back to OR. 

## 2015-11-28 NOTE — Brief Op Note (Signed)
11/28/2015  3:27 PM  PATIENT:  Danella Penton  50 y.o. female  PRE-OPERATIVE DIAGNOSIS:  RIGHT BREAST CANCER  POST-OPERATIVE DIAGNOSIS:  RIGHT BREAST CANCER  PROCEDURE:  Procedure(s): RIGHT BREAST LUMPECTOMY WITH NEEDLE LOCALIZATION AND AXILLARY SENTINEL LYMPH NODE BX (Right) MAMMARY REDUCTION  (BREAST) BILATERAL (Bilateral)  SURGEON:  Surgeon(s) and Role: Panel 1:    * Jovita Kussmaul, MD - Primary  Panel 2:    * Crissie Reese, MD - Primary  PHYSICIAN ASSISTANT:   ASSISTANTS: Judyann Munson, RNFA   ANESTHESIA:   general  EBL:  Total I/O In: 2000 [I.V.:2000] Out: 280 [Urine:180; Blood:100]  BLOOD ADMINISTERED:none  DRAINS: 1 Blake drain on each side   LOCAL MEDICATIONS USED:  NONE  SPECIMEN:  Source of Specimen:  Bilateral breast tissue  DISPOSITION OF SPECIMEN:  PATHOLOGY  COUNTS:  YES  TOURNIQUET:  * No tourniquets in log *  DICTATION: .Other Dictation: Dictation Number [id number]  PLAN OF CARE: Admit for overnight observation  PATIENT DISPOSITION:  PACU - hemodynamically stable.   Delay start of Pharmacological VTE agent (>24hrs) due to surgical blood loss or risk of bleeding: yes

## 2015-11-29 ENCOUNTER — Encounter (HOSPITAL_COMMUNITY): Payer: Self-pay | Admitting: General Surgery

## 2015-11-29 ENCOUNTER — Ambulatory Visit: Payer: 59 | Admitting: Oncology

## 2015-11-29 DIAGNOSIS — E039 Hypothyroidism, unspecified: Secondary | ICD-10-CM | POA: Diagnosis not present

## 2015-11-29 DIAGNOSIS — F329 Major depressive disorder, single episode, unspecified: Secondary | ICD-10-CM | POA: Diagnosis not present

## 2015-11-29 DIAGNOSIS — C50411 Malignant neoplasm of upper-outer quadrant of right female breast: Secondary | ICD-10-CM | POA: Diagnosis not present

## 2015-11-29 DIAGNOSIS — I10 Essential (primary) hypertension: Secondary | ICD-10-CM | POA: Diagnosis not present

## 2015-11-29 DIAGNOSIS — F419 Anxiety disorder, unspecified: Secondary | ICD-10-CM | POA: Diagnosis not present

## 2015-11-29 LAB — GLUCOSE, CAPILLARY
Glucose-Capillary: 105 mg/dL — ABNORMAL HIGH (ref 65–99)
Glucose-Capillary: 100 mg/dL — ABNORMAL HIGH (ref 65–99)

## 2015-11-29 MED ORDER — ENOXAPARIN SODIUM 40 MG/0.4ML ~~LOC~~ SOLN
40.0000 mg | SUBCUTANEOUS | Status: DC
  Administered 2015-11-29: 40 mg via SUBCUTANEOUS
  Filled 2015-11-29: qty 0.4

## 2015-11-29 MED ORDER — METHOCARBAMOL 500 MG PO TABS
500.0000 mg | ORAL_TABLET | Freq: Four times a day (QID) | ORAL | Status: DC | PRN
  Administered 2015-11-29: 500 mg via ORAL
  Filled 2015-11-29: qty 1

## 2015-11-29 MED ORDER — METHOCARBAMOL 500 MG PO TABS: 500.0000 mg | ORAL_TABLET | Freq: Four times a day (QID) | ORAL | Status: DC | PRN

## 2015-11-29 MED ORDER — ENOXAPARIN SODIUM 40 MG/0.4ML ~~LOC~~ SOLN: 40.0000 mg | SUBCUTANEOUS | Status: DC

## 2015-11-29 MED ORDER — OXYCODONE-ACETAMINOPHEN 5-325 MG PO TABS: 1.0000 | ORAL_TABLET | ORAL | Status: DC | PRN

## 2015-11-29 MED FILL — METHOCARBAMOL 500 MG TABLET: 500 | 10 days supply | Qty: 40 | Fill #0

## 2015-11-29 MED FILL — ONDANSETRON ODT 4 MG TABLET: 4 | 4 days supply | Qty: 15 | Fill #0

## 2015-11-29 MED FILL — ENOXAPARIN 40 MG/0.4 ML SYR: 40 | 6 days supply | Qty: 5 | Fill #0

## 2015-11-29 MED FILL — OXYCODONE/APAP 5-325: 5-325 | 5 days supply | Qty: 50 | Fill #0

## 2015-11-29 NOTE — Progress Notes (Signed)
Patient discharged to home with instructions given, verbalized understanding.

## 2015-11-29 NOTE — Op Note (Deleted)
Debbie [name], Debbie [name] NO.:  [id number]  MEDICAL RECORD NO.:  [id number]  LOCATION:  SDS                          FACILITY:  [facility]  PHYSICIAN:  Crissie Reese, M.D.     DATE OF BIRTH:  1966/05/09  DATE OF PROCEDURE:  11/28/2015 DATE OF DISCHARGE:                              OPERATIVE REPORT   PREOPERATIVE DIAGNOSIS:  Right breast cancer.  POSTOPERATIVE DIAGNOSIS:  Right breast cancer.  PROCEDURE PERFORMED:  Bilateral breast reduction with oncologic procedure resection on the right side.  SURGEON:  Crissie Reese, M.D.  ASSISTANT:  Judyann Munson, RNFA.  CLINICAL NOTE:  A 50 year old woman has a breast cancer in the upper inner quadrant of the right breast.  She desired a lumpectomy combined with breast reduction.  The nature this procedure and risks and possible complications were discussed with her in detail.  Risks include not limited to, bleeding, infection, healing problems, scarring, loss of sensation in nipples, fluid accumulations, anesthesia-related complications, DVT, PE, loss of tissue, loss of nipple, loss of skin, loss of fatty tissue and asymmetry, chronic pain, and overall disappointment.  She clearly understood that radiation would affect this right breast.  She understood that the right side would be little bit smaller and a little bit more elevated.  Having understood all this, she wished to proceed.  DESCRIPTION OF PROCEDURE:  The patient was marked in the holding area for the bilateral breast reduction.  Right side was already slightly larger than the left.  She was taken to the operating room and placed supine.  After the successful induction of general anesthesia, she was prepped with ChloraPrep and after waiting 3 minutes for drying, she was draped with sterile drapes.  The left side was approached first followed by the right.  The 8 cm wide inferior nipple-areolar pedicles were designed in the 42 mm marker marking the nipple-areolar  complexes.  The inferior pedicles were de-epithelialized, and all incisions having been made.  The inferior pedicle was isolated from shunting tissue beveling outward both medial and lateral in order to ensure a broader attachment at the chest wall level then present level of the skin.  This type of dissection was performed in order to preserve blood flow to the nipple complexes.  Resections were performed medial, central, and lateral and total of 736 g from the left breast and final total from the right breast 823 g.  Right breast was larger than the left.  During the resection on the right side, Dr. Marlou Starks, the general surgeon was there, and he performed the portion of the dissection that included the wires that had been placed as brackets around the area of cancer.  Please see his operative note for those details.  A thorough irrigation with saline and meticulous hemostasis was achieved using electrocautery.  Nipple complexes were inspected and found to have bright red bleeding around the periphery consistent viability.  Excellent hemostasis having been ensured, the closure with 2-0 and 3-0 Monocryl interrupted inverted deep dermal sutures, running 3-0 Monocryl subcuticular suture.  A measurement was then taken to 5 cm up from the inframammary crease and the 42 mm markers to mark the site for the nipple-areolar complex.  This tissue  was dissected, irrigation with saline, hemostasis with electrocautery, and the nipple complex was brought through these openings.  Again, they were inspected and found to have good color with bright red bleeding on the periphery consistent viability.  Nipple-areolar insetting with 3-0 Monocryl and a 2-0 Monocryl interrupted inverted deep dermal sutures and running 4-0 Monocryl subcuticular suture.  Dermabond, dry sterile dressings, circumferential Ace wrap, and the breast binder were positioned, and she was transferred to the recovery room in stable having  tolerated the procedure well.  One of the [facility] drain was positioned on each side and brought out through the lateral aspect of the incision prior to closure.  DISPOSITION:  She will be observed overnight.     Crissie Reese, M.D.     DB/MEDQ  D:  11/28/2015  T:  11/29/2015  Job:  [id number]

## 2015-11-29 NOTE — Progress Notes (Signed)
1 Day Post-Op  Subjective: No complaints. Feels better than she thought she would  Objective: Vital signs in last 24 hours: Temp:  [97.3 F (36.3 C)-99.6 F (37.6 C)] 98.4 F (36.9 C) (07/21 0643) Pulse Rate:  [78-105] 78 (07/21 0643) Resp:  [13-20] 20 (07/21 0643) BP: (106-160)/(41-99) 127/69 mmHg (07/21 0643) SpO2:  [91 %-100 %] 100 % (07/21 0643) Weight:  [112.4 kg (247 lb 12.8 oz)-113.399 kg (250 lb)] 112.4 kg (247 lb 12.8 oz) (07/20 1723) Last BM Date: 11/27/15  Intake/Output from previous day: 07/20 0701 - 07/21 0700 In: 3250 [I.V.:3250] Out: 420 [Urine:180; Drains:90; Blood:150] Intake/Output this shift:    Resp: clear to auscultation bilaterally Cardio: regular rate and rhythm GI: soft, non-tender; bowel sounds normal; no masses,  no organomegaly  Lab Results:  No results for input(s): WBC, HGB, HCT, PLT in the last 72 hours. BMET No results for input(s): NA, K, CL, CO2, GLUCOSE, BUN, CREATININE, CALCIUM in the last 72 hours. PT/INR No results for input(s): LABPROT, INR in the last 72 hours. ABG No results for input(s): PHART, HCO3 in the last 72 hours.  Invalid input(s): PCO2, PO2  Studies/Results: Nm Sentinel Node Inj-no Rpt (breast)  11/28/2015  CLINICAL DATA: right breast cancer Sulfur colloid was injected intradermally by the nuclear medicine technologist for breast cancer sentinel node localization.   Mm Breast Surgical Specimen  11/28/2015  CLINICAL DATA:  Post right breast lumpectomy for recently diagnosed grade 2 invasive mammary carcinoma and mammary carcinoma in situ. EXAM: SPECIMEN RADIOGRAPH OF THE RIGHT BREAST COMPARISON:  Previous exam(s). FINDINGS: Status post excision of the right breast. The two wire tips and biopsy marker clip are present and are marked for pathology. Diffuse calcifications are present in the specimen radiograph, with the most calcifications concentrated in the region of the coil biopsy marking clip. IMPRESSION: Specimen  radiograph of the right breast. Electronically Signed   By: Everlean Alstrom M.D.   On: 11/28/2015 14:33   Mm Rt Plc Breast Loc Dev   1st Lesion  Inc Mammo Guide  11/28/2015  CLINICAL DATA:  50 year old female with recently diagnosed grade 2 invasive mammary carcinoma and mammary carcinoma in situ post stereotactic guided biopsy of calcifications and distortion in the upper inner right breast on 10/15/2015, with calcifications spanning a distance of approximately 5.4 cm. Patient presents for bracketing of calcifications prior to planned lumpectomy and bilateral breast reduction. EXAM: NEEDLE LOCALIZATION OF THE RIGHT BREAST WITH MAMMO GUIDANCE COMPARISON:  Previous exams. FINDINGS: Patient presents for needle localization prior to right breast lumpectomy. I met with the patient and we discussed the procedure of needle localization including benefits and alternatives. We discussed the high likelihood of a successful procedure. We discussed the risks of the procedure, including infection, bleeding, tissue injury, and further surgery. Informed, written consent was given. The usual time-out protocol was performed immediately prior to the procedure. SITE 1: CALCIFICATIONS UPPER INNER POSTERIOR DEPTH RIGHT BREAST: Using mammographic guidance, sterile technique, 1% lidocaine and a 11 cm modified Kopans needle, the more posteriorly located calcifications in the upper inner right breast adjacent to the coil shaped biopsy marking clip were localized using medial to lateral approach. SITE 2: CALCIFICATIONS UPPER INNER MIDDLE DEPTH RIGHT BREAST: Using mammographic guidance, sterile technique, 1% lidocaine and a 9 cm modified Kopans needle, the anterior aspect of the calcifications in the upper inner right breast were localized using medial to lateral approach. The images were marked for Dr. Marlou Starks. IMPRESSION: Needle localization with bracketing of the calcifications  and distortion in the upper inner right breast breast. No  apparent complications. Electronically Signed   By: Everlean Alstrom M.D.   On: 11/28/2015 08:51   Mm Rt Plc Breast Loc Dev   Ea Add Lesion  Inc Mammo Guide  11/28/2015  CLINICAL DATA:  50 year old female with recently diagnosed grade 2 invasive mammary carcinoma and mammary carcinoma in situ post stereotactic guided biopsy of calcifications and distortion in the upper inner right breast on 10/15/2015, with calcifications spanning a distance of approximately 5.4 cm. Patient presents for bracketing of calcifications prior to planned lumpectomy and bilateral breast reduction. EXAM: NEEDLE LOCALIZATION OF THE RIGHT BREAST WITH MAMMO GUIDANCE COMPARISON:  Previous exams. FINDINGS: Patient presents for needle localization prior to right breast lumpectomy. I met with the patient and we discussed the procedure of needle localization including benefits and alternatives. We discussed the high likelihood of a successful procedure. We discussed the risks of the procedure, including infection, bleeding, tissue injury, and further surgery. Informed, written consent was given. The usual time-out protocol was performed immediately prior to the procedure. SITE 1: CALCIFICATIONS UPPER INNER POSTERIOR DEPTH RIGHT BREAST: Using mammographic guidance, sterile technique, 1% lidocaine and a 11 cm modified Kopans needle, the more posteriorly located calcifications in the upper inner right breast adjacent to the coil shaped biopsy marking clip were localized using medial to lateral approach. SITE 2: CALCIFICATIONS UPPER INNER MIDDLE DEPTH RIGHT BREAST: Using mammographic guidance, sterile technique, 1% lidocaine and a 9 cm modified Kopans needle, the anterior aspect of the calcifications in the upper inner right breast were localized using medial to lateral approach. The images were marked for Dr. Marlou Starks. IMPRESSION: Needle localization with bracketing of the calcifications and distortion in the upper inner right breast breast. No  apparent complications. Electronically Signed   By: Everlean Alstrom M.D.   On: 11/28/2015 08:51    Anti-infectives: Anti-infectives    Start     Dose/Rate Route Frequency Ordered Stop   11/28/15 2100  ceFAZolin (ANCEF) IVPB 2g/100 mL premix     2 g 200 mL/hr over 30 Minutes Intravenous Every 6 hours 11/28/15 1729     11/28/15 0951  ceFAZolin (ANCEF) IVPB 2g/100 mL premix     2 g 200 mL/hr over 30 Minutes Intravenous On call to O.R. 11/28/15 0951 11/28/15 1455   11/28/15 0945  ceFAZolin (ANCEF) 2-4 GM/100ML-% IVPB    Comments:  Sammuel Cooper   : cabinet override      11/28/15 0945 11/28/15 1125      Assessment/Plan: s/p Procedure(s): RIGHT BREAST LUMPECTOMY WITH NEEDLE LOCALIZATION AND AXILLARY SENTINEL LYMPH NODE BX (Right) MAMMARY REDUCTION  (BREAST) BILATERAL (Bilateral) Advance diet  Ok for discharge if Dr. Harlow Mares approves Teach patient drain care     TOTH III,Chancy Claros S 11/29/2015

## 2015-11-29 NOTE — Anesthesia Postprocedure Evaluation (Signed)
Anesthesia Post Note  Patient: Debbie [name]  Procedure(s) Performed: Procedure(s) (LRB): RIGHT BREAST LUMPECTOMY WITH NEEDLE LOCALIZATION AND AXILLARY SENTINEL LYMPH NODE BX (Right) MAMMARY REDUCTION  (BREAST) BILATERAL (Bilateral)  Patient location during evaluation: PACU Anesthesia Type: General Level of consciousness: sedated and patient cooperative Pain management: pain level controlled Vital Signs Assessment: post-procedure vital signs reviewed and stable Respiratory status: spontaneous breathing Cardiovascular status: stable Anesthetic complications: no    Last Vitals:  Filed Vitals:   11/29/15 0155 11/29/15 0643  BP: 121/60 127/69  Pulse: 84 78  Temp: 36.9 C 36.9 C  Resp: 18 20    Last Pain:  Filed Vitals:   11/29/15 1020  PainSc: 2                  Nolon Nations

## 2015-11-29 NOTE — Op Note (Signed)
Debbie [name], LAPRE NO.:  [id number]  MEDICAL RECORD NO.:  [id number]  LOCATION:  SDS                          FACILITY:  [facility]  PHYSICIAN:  Crissie Reese, M.D.     DATE OF BIRTH:  03-14-1966  DATE OF PROCEDURE:  11/28/2015 DATE OF DISCHARGE:                              OPERATIVE REPORT   PREOPERATIVE DIAGNOSIS:  Right breast cancer.  POSTOPERATIVE DIAGNOSIS:  Right breast cancer.  PROCEDURE PERFORMED:  Bilateral breast reduction with oncologic procedure resection on the right side.  SURGEON:  Crissie Reese, M.D.  ASSISTANT:  Judyann Munson, RNFA.  CLINICAL NOTE:  A 50 year old woman has a breast cancer in the upper inner quadrant of the right breast.  She desired a lumpectomy combined with breast reduction.  The nature this procedure and risks and possible complications were discussed with her in detail.  Risks include not limited to, bleeding, infection, healing problems, scarring, loss of sensation in nipples, fluid accumulations, anesthesia-related complications, DVT, PE, loss of tissue, loss of nipple, loss of skin, loss of fatty tissue and asymmetry, chronic pain, and overall disappointment.  She clearly understood that radiation would affect this right breast.  She understood that the right side would be little bit smaller and a little bit more elevated.  Having understood all this, she wished to proceed.  DESCRIPTION OF PROCEDURE:  The patient was marked in the holding area for the bilateral breast reduction.  Right side was already slightly larger than the left.  She was taken to the operating room and placed supine.  After the successful induction of general anesthesia, she was prepped with ChloraPrep and after waiting 3 minutes for drying, she was draped with sterile drapes.  The left side was approached first followed by the right.  The 8 cm wide inferior nipple-areolar pedicles were designed in the 42 mm marker marking the nipple-areolar  complexes.  The inferior pedicles were de-epithelialized, and all incisions having been made.  The inferior pedicle was isolated from shunting tissue beveling outward both medial and lateral in order to ensure a broader attachment at the chest wall level then present level of the skin.  This type of dissection was performed in order to preserve blood flow to the nipple complexes.  Resections were performed medial, central, and lateral and total of 736 g from the left breast and final total from the right breast 823 g.  Right breast was larger than the left.  During the resection on the right side, Dr. Marlou Starks, the general surgeon was there, and he performed the portion of the dissection that included the wires that had been placed as brackets around the area of cancer.  Please see his operative note for those details.  A thorough irrigation with saline and meticulous hemostasis was achieved using electrocautery.  Nipple complexes were inspected and found to have bright red bleeding around the periphery consistent viability.  Excellent hemostasis having been ensured, the closure with 2-0 and 3-0 Monocryl interrupted inverted deep dermal sutures, running 3-0 Monocryl subcuticular suture.  A measurement was then taken to 5 cm up from the inframammary crease and the 42 mm markers to mark the site for the nipple-areolar complex.  This tissue  was dissected, irrigation with saline, hemostasis with electrocautery, and the nipple complex was brought through these openings.  Again, they were inspected and found to have good color with bright red bleeding on the periphery consistent viability.  Nipple-areolar insetting with 3-0 Monocryl and a 2-0 Monocryl interrupted inverted deep dermal sutures and running 4-0 Monocryl subcuticular suture.  Dermabond, dry sterile dressings, circumferential Ace wrap, and the breast binder were positioned, and she was transferred to the recovery room in stable having  tolerated the procedure well.  One of the [facility] drain was positioned on each side and brought out through the lateral aspect of the incision prior to closure.  DISPOSITION:  She will be observed overnight.     Crissie Reese, M.D.     DB/MEDQ  D:  11/28/2015  T:  11/29/2015  Job:  [id number]

## 2015-11-29 NOTE — Discharge Instructions (Addendum)
No lifting for 6 weeks No vigorous activity for 6 weeks (including outdoor walks) No driving for 4 weeks OK to walk up stairs slowly Stay propped up Use incentive spirometer at home every hour while awake No shower until Monday. Then okay to remove all dressings, shower, pat dry, place pads in crease under breasts, and place the velcro breast binder back on Take an over-the-counter stool softener (such as Colace) while on pain medication See Dr. Harlow Mares next week For questions call [phone] or [phone]

## 2015-11-29 NOTE — Progress Notes (Signed)
Subjective: No complaints. Minimal discomfort.  Objective: Vital signs in last 24 hours: Temp:  [97.3 F (36.3 C)-99.6 F (37.6 C)] 98.4 F (36.9 C) (07/21 0643) Pulse Rate:  [78-105] 78 (07/21 0643) Resp:  [13-20] 20 (07/21 0643) BP: (106-160)/(41-99) 127/69 mmHg (07/21 0643) SpO2:  [91 %-100 %] 100 % (07/21 0643) Weight:  [247 lb 12.8 oz (112.4 kg)-250 lb (113.399 kg)] 247 lb 12.8 oz (112.4 kg) (07/20 1723)  Intake/Output from previous day: 07/20 0701 - 07/21 0700 In: 3250 [I.V.:3250] Out: 420 [Urine:180; Drains:90; Blood:150] Intake/Output this shift:    Operative sites: Chest soft bilateral. No evidence of bleeding or infection. Nipple complexes have good color and appear viable. Drains functioning. Drainage thin. Drains are removed.  No results for input(s): WBC, HGB, HCT, NA, K, CL, CO2, BUN, CREATININE, GLU in the last 72 hours.  Invalid input(s): PLATELETS  Studies/Results: Nm Sentinel Node Inj-no Rpt (breast)  11/28/2015  CLINICAL DATA: right breast cancer Sulfur colloid was injected intradermally by the nuclear medicine technologist for breast cancer sentinel node localization.   Mm Breast Surgical Specimen  11/28/2015  CLINICAL DATA:  Post right breast lumpectomy for recently diagnosed grade 2 invasive mammary carcinoma and mammary carcinoma in situ. EXAM: SPECIMEN RADIOGRAPH OF THE RIGHT BREAST COMPARISON:  Previous exam(s). FINDINGS: Status post excision of the right breast. The two wire tips and biopsy marker clip are present and are marked for pathology. Diffuse calcifications are present in the specimen radiograph, with the most calcifications concentrated in the region of the coil biopsy marking clip. IMPRESSION: Specimen radiograph of the right breast. Electronically Signed   By: Everlean Alstrom M.D.   On: 11/28/2015 14:33   Mm Rt Plc Breast Loc Dev   1st Lesion  Inc Mammo Guide  11/28/2015  CLINICAL DATA:  50 year old female with recently diagnosed grade 2  invasive mammary carcinoma and mammary carcinoma in situ post stereotactic guided biopsy of calcifications and distortion in the upper inner right breast on 10/15/2015, with calcifications spanning a distance of approximately 5.4 cm. Patient presents for bracketing of calcifications prior to planned lumpectomy and bilateral breast reduction. EXAM: NEEDLE LOCALIZATION OF THE RIGHT BREAST WITH MAMMO GUIDANCE COMPARISON:  Previous exams. FINDINGS: Patient presents for needle localization prior to right breast lumpectomy. I met with the patient and we discussed the procedure of needle localization including benefits and alternatives. We discussed the high likelihood of a successful procedure. We discussed the risks of the procedure, including infection, bleeding, tissue injury, and further surgery. Informed, written consent was given. The usual time-out protocol was performed immediately prior to the procedure. SITE 1: CALCIFICATIONS UPPER INNER POSTERIOR DEPTH RIGHT BREAST: Using mammographic guidance, sterile technique, 1% lidocaine and a 11 cm modified Kopans needle, the more posteriorly located calcifications in the upper inner right breast adjacent to the coil shaped biopsy marking clip were localized using medial to lateral approach. SITE 2: CALCIFICATIONS UPPER INNER MIDDLE DEPTH RIGHT BREAST: Using mammographic guidance, sterile technique, 1% lidocaine and a 9 cm modified Kopans needle, the anterior aspect of the calcifications in the upper inner right breast were localized using medial to lateral approach. The images were marked for Dr. Marlou Starks. IMPRESSION: Needle localization with bracketing of the calcifications and distortion in the upper inner right breast breast. No apparent complications. Electronically Signed   By: Everlean Alstrom M.D.   On: 11/28/2015 08:51   Mm Rt Plc Breast Loc Dev   Ea Add Lesion  Inc Mammo Guide  11/28/2015  CLINICAL DATA:  50 year old female with recently diagnosed grade 2  invasive mammary carcinoma and mammary carcinoma in situ post stereotactic guided biopsy of calcifications and distortion in the upper inner right breast on 10/15/2015, with calcifications spanning a distance of approximately 5.4 cm. Patient presents for bracketing of calcifications prior to planned lumpectomy and bilateral breast reduction. EXAM: NEEDLE LOCALIZATION OF THE RIGHT BREAST WITH MAMMO GUIDANCE COMPARISON:  Previous exams. FINDINGS: Patient presents for needle localization prior to right breast lumpectomy. I met with the patient and we discussed the procedure of needle localization including benefits and alternatives. We discussed the high likelihood of a successful procedure. We discussed the risks of the procedure, including infection, bleeding, tissue injury, and further surgery. Informed, written consent was given. The usual time-out protocol was performed immediately prior to the procedure. SITE 1: CALCIFICATIONS UPPER INNER POSTERIOR DEPTH RIGHT BREAST: Using mammographic guidance, sterile technique, 1% lidocaine and a 11 cm modified Kopans needle, the more posteriorly located calcifications in the upper inner right breast adjacent to the coil shaped biopsy marking clip were localized using medial to lateral approach. SITE 2: CALCIFICATIONS UPPER INNER MIDDLE DEPTH RIGHT BREAST: Using mammographic guidance, sterile technique, 1% lidocaine and a 9 cm modified Kopans needle, the anterior aspect of the calcifications in the upper inner right breast were localized using medial to lateral approach. The images were marked for Dr. Marlou Starks. IMPRESSION: Needle localization with bracketing of the calcifications and distortion in the upper inner right breast breast. No apparent complications. Electronically Signed   By: Everlean Alstrom M.D.   On: 11/28/2015 08:51    Assessment/Plan:  Doing well. Discharge today. She would like to continue Lovenox for DVT prophylaxis. She understands there is a little bit  of increased risk for bleeding.     Debbie [name] M 11/29/2015 8:29 AM

## 2015-11-29 NOTE — Op Note (Deleted)
Debbie [name], Debbie [name] NO.:  [id number]  MEDICAL RECORD NO.:  [id number]  LOCATION:  SDS                          FACILITY:  [facility]  PHYSICIAN:  Crissie Reese, M.D.     DATE OF BIRTH:  Oct 05, 1965  DATE OF PROCEDURE:  11/28/2015 DATE OF DISCHARGE:                              OPERATIVE REPORT   PREOPERATIVE DIAGNOSIS:  Right breast cancer.  POSTOPERATIVE DIAGNOSIS:  Right breast cancer.  PROCEDURE PERFORMED:  Bilateral breast reduction with oncologic procedure resection on the right side.  SURGEON:  Crissie Reese, M.D.  ASSISTANT:  Judyann Munson, RNFA.  CLINICAL NOTE:  A 50 year old woman has a breast cancer in the upper inner quadrant of the right breast.  She desired a lumpectomy combined with breast reduction.  The nature this procedure and risks and possible complications were discussed with her in detail.  Risks include not limited to, bleeding, infection, healing problems, scarring, loss of sensation in nipples, fluid accumulations, anesthesia-related complications, DVT, PE, loss of tissue, loss of nipple, loss of skin, loss of fatty tissue and asymmetry, chronic pain, and overall disappointment.  She clearly understood that radiation would affect this right breast.  She understood that the right side would be little bit smaller and a little bit more elevated.  Having understood all this, she wished to proceed.  DESCRIPTION OF PROCEDURE:  The patient was marked in the holding area for the bilateral breast reduction.  Right side was already slightly larger than the left.  She was taken to the operating room and placed supine.  After the successful induction of general anesthesia, she was prepped with ChloraPrep and after waiting 3 minutes for drying, she was draped with sterile drapes.  The left side was approached first followed by the right.  The 8 cm wide inferior nipple-areolar pedicles were designed in the 42 mm marker marking the nipple-areolar  complexes.  The inferior pedicles were de-epithelialized, and all incisions having been made.  The inferior pedicle was isolated from shunting tissue beveling outward both medial and lateral in order to ensure a broader attachment at the chest wall level then present level of the skin.  This type of dissection was performed in order to preserve blood flow to the nipple complexes.  Resections were performed medial, central, and lateral and total of 736 g from the left breast and final total from the right breast 823 g.  Right breast was larger than the left.  During the resection on the right side, Dr. Marlou Starks, the general surgeon was there, and he performed the portion of the dissection that included the wires that had been placed as brackets around the area of cancer.  Please see his operative note for those details.  A thorough irrigation with saline and meticulous hemostasis was achieved using electrocautery.  Nipple complexes were inspected and found to have bright red bleeding around the periphery consistent viability.  Excellent hemostasis having been ensured, the closure with 2-0 and 3-0 Monocryl interrupted inverted deep dermal sutures, running 3-0 Monocryl subcuticular suture.  A measurement was then taken to 5 cm up from the inframammary crease and the 42 mm markers to mark the site for the nipple-areolar complex.  This tissue  was dissected, irrigation with saline, hemostasis with electrocautery, and the nipple complex was brought through these openings.  Again, they were inspected and found to have good color with bright red bleeding on the periphery consistent viability.  Nipple-areolar insetting with 3-0 Monocryl and a 2-0 Monocryl interrupted inverted deep dermal sutures and running 4-0 Monocryl subcuticular suture.  Dermabond, dry sterile dressings, circumferential Ace wrap, and the breast binder were positioned, and she was transferred to the recovery room in stable having  tolerated the procedure well.  One of the [facility] drain was positioned on each side and brought out through the lateral aspect of the incision prior to closure.  DISPOSITION:  She will be observed overnight.     Crissie Reese, M.D.     DB/MEDQ  D:  11/28/2015  T:  11/29/2015  Job:  [id number]

## 2015-12-04 ENCOUNTER — Other Ambulatory Visit: Payer: Self-pay | Admitting: General Surgery

## 2015-12-05 ENCOUNTER — Telehealth: Payer: Self-pay | Admitting: *Deleted

## 2015-12-05 MED FILL — LEVOTHYROXINE 125 MCG TAB: 125 | 30 days supply | Qty: 30 | Fill #10

## 2015-12-05 NOTE — Telephone Encounter (Signed)
Received order per Dr. [name] for Mammaprint testing. Requisition sent to pathology. Received by Varney Biles.

## 2015-12-10 ENCOUNTER — Encounter (HOSPITAL_COMMUNITY): Payer: Self-pay

## 2015-12-10 ENCOUNTER — Telehealth: Payer: Self-pay | Admitting: *Deleted

## 2015-12-10 NOTE — Progress Notes (Signed)
Location of Breast Cancer: Right Breast  Histology per Pathology Report:  11/28/15 Diagnosis 1. Breast, Mammoplasty, left - INVASIVE DUCTAL CARCINOMA, GRADE I/III, SPANNING AT LEAST 1.2 CM. - DUCTAL CARCINOMA IN SITU WITH CALCIFICATIONS, HIGH GRADE. - LOBULAR NEOPLASIA (LOBULAR CARCINOMA IN SITU). - SEE ONCOLOGY TABLE BELOW. 2. Lymph node, sentinel, biopsy, Right axillary #1 - METASTATIC CARCINOMA IN 1 OF 1 LYMPH NODE (1/1), WITH EXTRACAPSULAR EXTENSION. 3. Breast, lumpectomy, Right - INVASIVE DUCTAL CARCINOMA, GRADE I/III, SPANNING 2.3 CM. - DUCTAL CARCINOMA IN SITU WITH CALCIFICATIONS, INTERMEDIATE GRADE - LYMPHOVASCULAR INVASION IS IDENTIFIED. - INVASIVE CARCINOMA IS FOCALLY 0.1 CM TO THE MEDIAL MARGIN OF SPECIMEN #3. - SEE ONCOLOGY TABLE BELOW. 4. Breast, excision, Right additional medial margin - BENIGN BREAST PARENCHYMA. - THERE IS NO EVIDENCE OF MALIGNANCY. - SEE COMMENT. 5. Breast, excision, Right additional anterior margin - INVASIVE DUCTAL CARCINOMA. - THE SURGICAL RESECTION MARGIN IS NEGATIVE FOR CARCINOMA. 6. Breast, Mammoplasty, Right breast tissue - LOBULAR NEOPLASIA (ATYPICAL LOBULAR HYPERPLASIA).  Receptor Status: ER(80%), PR (60%), Her2-neu (NEG), Ki-(3%)  Did patient present with symptoms or was this found on screening mammography?: It was found on a screening mammogram.   Past/Anticipated interventions by surgeon, if any: To have further surgery 12/26/15 bilateral mastectomy 11/28/15 PROCEDURE:  Procedure(s): RIGHT BREAST LUMPECTOMY WITH NEEDLE LOCALIZATION AND AXILLARY SENTINEL LYMPH NODE BX (Right) MAMMARY REDUCTION  (BREAST) BILATERAL (Bilateral)    SURGEON:  Surgeon(s) and Role: Panel 1:    * Jovita Kussmaul, MD - Primary  Panel 2:    * Crissie Reese, MD - Primary    Past/Anticipated interventions by medical oncology, if any: No chemotherapy Dr. [name] 10/23/15 in the [facility] (1) to have further surgery 12/26/15 bilateral mastectomy (2)  type DX to be obtained from the definitive surgery sample (3) adjuvant radiation to follow as appropriate (4) started on tamoxifen neoadjuvantly 10/23/2015 to allow for expected delays in surgery scheduling  She had an appointment with Dr. [name] today at 9:00  Lymphedema issues, if any: None    Pain issues, if any: None   SAFETY ISSUES:  Prior radiation? No  Pacemaker/ICD? No   Possible current pregnancy?  Is the patient on methotrexate? No  Current Complaints / other details:      No family history of breast cancer Grandmother, maternal - Cervical cancer and Grandmother, paternal- Cervical cancer, and Colon cancer    Malmfelt, [name], RN 12/10/2015,2:15 PM

## 2015-12-10 NOTE — Telephone Encounter (Signed)
Received Mammaprint of Low Risk Informed pt of these results. Physician team notified.

## 2015-12-11 ENCOUNTER — Encounter: Payer: Self-pay | Admitting: Genetic Counselor

## 2015-12-12 ENCOUNTER — Telehealth: Payer: Self-pay | Admitting: *Deleted

## 2015-12-12 MED FILL — METFORMIN HCL ER 750 MG TAB: 750 | 30 days supply | Qty: 30 | Fill #3

## 2015-12-12 NOTE — Telephone Encounter (Signed)
Received Mammaprint score of Low Risk.  Placed a copy in Dr. Virgie Dad box, gave a copy to Silt and took a copy to HIM to scan.

## 2015-12-13 ENCOUNTER — Encounter (HOSPITAL_COMMUNITY): Payer: Self-pay | Admitting: General Surgery

## 2015-12-16 ENCOUNTER — Other Ambulatory Visit: Payer: Self-pay | Admitting: Oncology

## 2015-12-18 ENCOUNTER — Ambulatory Visit (HOSPITAL_BASED_OUTPATIENT_CLINIC_OR_DEPARTMENT_OTHER): Payer: 59 | Admitting: Oncology

## 2015-12-18 ENCOUNTER — Encounter: Payer: Self-pay | Admitting: Radiation Oncology

## 2015-12-18 ENCOUNTER — Ambulatory Visit
Admission: RE | Admit: 2015-12-18 | Discharge: 2015-12-18 | Disposition: A | Discharge status: Home / Self Care | Payer: 59 | Source: Ambulatory Visit | Attending: Radiation Oncology | Admitting: Radiation Oncology

## 2015-12-18 VITALS — BP 131/77 | HR 81 | Temp 98.4°F | Ht 66.0 in | Wt 244.7 lb

## 2015-12-18 VITALS — BP 139/92 | HR 87 | Temp 98.7°F | Resp 18 | Ht 66.0 in | Wt 244.8 lb

## 2015-12-18 DIAGNOSIS — C50112 Malignant neoplasm of central portion of left female breast: Secondary | ICD-10-CM | POA: Diagnosis not present

## 2015-12-18 DIAGNOSIS — Z170 Estrogen receptor positive status [ER+]: Secondary | ICD-10-CM | POA: Diagnosis not present

## 2015-12-18 DIAGNOSIS — C50219 Malignant neoplasm of upper-inner quadrant of unspecified female breast: Secondary | ICD-10-CM | POA: Diagnosis not present

## 2015-12-18 DIAGNOSIS — C50911 Malignant neoplasm of unspecified site of right female breast: Secondary | ICD-10-CM

## 2015-12-18 DIAGNOSIS — C50912 Malignant neoplasm of unspecified site of left female breast: Secondary | ICD-10-CM

## 2015-12-18 DIAGNOSIS — C50211 Malignant neoplasm of upper-inner quadrant of right female breast: Secondary | ICD-10-CM

## 2015-12-18 DIAGNOSIS — Z9013 Acquired absence of bilateral breasts and nipples: Secondary | ICD-10-CM | POA: Insufficient documentation

## 2015-12-18 DIAGNOSIS — Z510 Encounter for antineoplastic radiation therapy: Secondary | ICD-10-CM | POA: Insufficient documentation

## 2015-12-18 DIAGNOSIS — M8580 Other specified disorders of bone density and structure, unspecified site: Secondary | ICD-10-CM

## 2015-12-18 NOTE — Progress Notes (Signed)
[place]  [phone] Fax:([phone]) [phone]     ID: CHANE MAGNER DOB: 1966/03/19  MR#: [id number]  JZP#:[id number]  Patient Care Team: Kelton Pillar, MD as PCP - General (Family Medicine) Jacelyn Pi, MD as Consulting Physician (Endocrinology) Eldred Manges, MD as Consulting Physician (Obstetrics and Gynecology) Autumn Messing III, MD as Consulting Physician (General Surgery) Chauncey Cruel, MD as Consulting Physician (Oncology) [name], MD as Attending Physician (Radiation Oncology) Belva Crome, MD as Consulting Physician (Cardiology) Benson Norway, RN as Registered Nurse Crissie Reese, MD as Consulting Physician (Plastic Surgery) PCP: Osborne Casco, MD OTHER MD:  CHIEF COMPLAINT: Estrogen receptor positive breast cancer  CURRENT TREATMENT: tamoxifen   BREAST CANCER HISTORY:  from the original intake note:  Shakema had screening mammography with tomosynthesis at the [facility] 10/02/2015. This shows 2 possible masses in the right breast as well as some suspicious calcifications. The left breast was unremarkable. On 10/15/2015 she underwent right diagnostic mammography with tomography and right breast ultrasonography. The breast density was category B. Spot magnification views confirmed an area of pleomorphic calcifications measuring up to 5 cm in the upper half of the breast. Also in the upper outer right breast there were adjacent low-density masses without distortion or calcifications. On physical exam there was no palpable abnormality.  Targeted ultrasound of the right breast found the 2 "masses" in the right upper quadrant were benign cysts measuring 0.8 and 0.5 cm respectively. Biopsy of the area of calcification on 10/15/2015 showed ([id number]) invasive ductal carcinoma, E-cadherin positive, grade 2, estrogen receptor 90% positive, progesterone receptor 50% positive, both with strong staining intensity, with an MIB-1 of  70%, and no HER-2 amplification, the signals ratio being 1.18 and the number per cell 2.60.  Her subsequent history is as detailed below    INTERVAL HISTORY: Ayiana returns today for follow-up of her now bilateral breast cancers accompanied by her husband [name]. Since her last visit here she underwent right lumpectomy and sentinel lymph node sampling with bilateral mammary reduction 11/28/2015. The final pathology ([id number]) showed, on the right, an invasive ductal carcinoma, measuring 2.3 cm, grade 1, with negative margins. The single sentinel lymph node was positive. However, on the left Oma there was also invasive ductal carcinoma, grade 1, measuring 1.2 cm, but with unclear margins. There was also ductal carcinoma in situ and that was present at a cauterized age. The left-sided tumor was estrogen receptor 80% positive, progesterone receptor 60% positive, both with weak staining intensity, with an MIB-1 of 3%, and no HER-2 amplification, the signals ratio being 1.20, and the number per cell 2.10.  Given this information, with further surgery anticipated, the patient has decided on bilateral mastectomies. This is scheduled for next week.  Myles has been on tamoxifen since mid June. She initially has moderate hot flashes, but these are becoming less of an issue. She has not had any vaginal wetness. She did have a period recently. 50 She was able to obtain the drug at a very good price  Her Mammaprint came in "low risk". This was extensively discussed today.   REVIEW OF SYSTEMS: Itzabella did well with her July surgery. There is an area in the inferior aspect of the left breast still has not healed completely. She does not have significant problems with pain, fever, bleeding, or other postoperative concerns. A detailed review of systems today was otherwise stable.  PAST MEDICAL HISTORY: Past Medical History:  Diagnosis Date  . Allergy   .  Anxiety   . Breast cancer of upper-inner quadrant of  right female breast ([place]) 10/17/2015  . Depression    no longer on medication off meds since Feb 2017  . Dysrhythmia   . Elevated cholesterol   . Family history of adverse reaction to anesthesia   . Headache   . Heart murmur   . Hypertension   . Hypothyroidism   . Menorrhagia 2011  . Urinary incontinence 2010    PAST SURGICAL HISTORY: Past Surgical History:  Procedure Laterality Date  . BREAST LUMPECTOMY Right 11/28/2015   RIGHT BREAST LUMPECTOMY WITH NEEDLE LOCALIZATION AND AXILLARY SENTINEL LYMPH NODE BX (Right)  . BREAST LUMPECTOMY WITH NEEDLE LOCALIZATION AND AXILLARY SENTINEL LYMPH NODE BX Right 11/28/2015   Procedure: RIGHT BREAST LUMPECTOMY WITH NEEDLE LOCALIZATION AND AXILLARY SENTINEL LYMPH NODE BX;  Surgeon: Autumn Messing III, MD;  Location: [facility];  Service: General;  Laterality: Right;  . BREAST REDUCTION SURGERY Bilateral 11/28/2015   Procedure: MAMMARY REDUCTION  (BREAST) BILATERAL;  Surgeon: Crissie Reese, MD;  Location: [facility];  Service: Plastics;  Laterality: Bilateral;  . CESAREAN SECTION    . COLONOSCOPY    . DENTAL SURGERY     2005,2013  . TUBAL LIGATION      FAMILY HISTORY Family History  Problem Relation Age of Onset  . Diabetes Mother   . Hypertension Mother   . Hypertension Father   . Diabetes Maternal Grandmother   . Hypertension Sister   . Cancer Paternal Grandmother     colon  . Colon cancer Paternal Grandmother   . Esophageal cancer Neg Hx   . Stomach cancer Neg Hx   . Rectal cancer Neg Hx   The patient's parents are still living, in their early 52s as of June 2017. The patient has one brother, 2 sisters. On the maternal side there is a history of uterine and prostate cancer. On the paternal side there is a history of colon cancer and possibly uterine cancer. There is no history of breast or ovarian cancer in the family.   GYNECOLOGIC HISTORY:  Patient's last menstrual period was 10/29/2015.  Menarche age 50. She still having regular periods. She is GX  P1, first pregnancy age 21. She used oral contraceptives for a few years remotely, with no complications. She is currently on a Mirena IUD and also is status post bilateral tubal ligation.  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 Her husband [name] is a Administrator. Their son [name] is a radio DJ. The patient has no grandchildren. She is not a Ambulance person.    ADVANCED DIRECTIVES: Not in place   HEALTH MAINTENANCE: Social History  Substance Use Topics  . Smoking status: Never Smoker  . Smokeless tobacco: Never Used  . Alcohol use No     Colonoscopy: January 2016   PAP: January 2016   Bone density: Never   Lipid panel:  Allergies  Allergen Reactions  . Ace Inhibitors Cough  . Atorvastatin Other (See Comments)    Joint pain  . Simvastatin Other (See Comments)    Joint pain    Current Outpatient Prescriptions  Medication Sig Dispense Refill  . acetaminophen (TYLENOL) 325 MG tablet Take 650 mg by mouth every 6 (six) hours as needed for mild pain. Reported on 10/23/2015    . calcium carbonate (OS-CAL) 600 MG TABS Take 600 mg by mouth daily.    . Cetirizine HCl (ZYRTEC ALLERGY PO) Take 1 tablet by mouth daily  as needed (allergies).     . enoxaparin (LOVENOX) 40 MG/0.4ML injection Inject 0.4 mLs (40 mg total) into the skin daily. 12 Syringe 0  . fish oil-omega-3 fatty acids 1000 MG capsule Take 1 g by mouth daily.    . hydrochlorothiazide (MICROZIDE) 12.5 MG capsule Take 12.5 mg by mouth daily.    . Ibuprofen (ADVIL PO) Take 200 mg by mouth every 6 (six) hours as needed (pain).     Marland Kitchen levonorgestrel (MIRENA) 20 MCG/24HR IUD 1 each by Intrauterine route once.    Marland Kitchen levothyroxine (SYNTHROID, LEVOTHROID) 125 MCG tablet Take 125 mcg by mouth daily.    Marland Kitchen LIVALO 2 MG TABS Take 0.2 mg by mouth daily.   6  . losartan (COZAAR) 100 MG tablet Take 100 mg by mouth daily.    . metFORMIN (GLUCOPHAGE-XR) 750 MG 24 hr tablet Take 750 mg by mouth every evening.    6  . methocarbamol (ROBAXIN) 500 MG tablet Take 1 tablet (500 mg total) by mouth every 6 (six) hours as needed for muscle spasms. 40 tablet 0  . Multiple Vitamins-Minerals (MULTIVITAMIN WITH MINERALS) tablet Take 1 tablet by mouth daily.    Marland Kitchen oxyCODONE-acetaminophen (ROXICET) 5-325 MG tablet Take 1-2 tablets by mouth every 4 (four) hours as needed. 50 tablet 0  . potassium chloride SA (K-DUR,KLOR-CON) 20 MEQ tablet Take 40 mEq by mouth 2 (two) times daily.      No current facility-administered medications for this visit.     OBJECTIVE: Middle-aged African-American woman In no acute distress Vitals:   12/18/15 0905  BP: (!) 139/92  Pulse: 87  Resp: 18  Temp: 98.7 F (37.1 C)     Body mass index is 39.51 kg/m.    ECOG FS:1 - Symptomatic but completely ambulatory  Sclerae unicteric, EOMs intact Oropharynx clear and moist No cervical or supraclavicular adenopathy Lungs no rales or rhonchi Heart regular rate and rhythm Abd soft, nontender, positive bowel sounds MSK no focal spinal tenderness, no upper extremity lymphedema Neuro: nonfocal, well oriented, appropriate affect Breasts: Status post right lumpectomy and bilateral reduction mammoplasties. On the right the incisions healing nicely. On the left there is an area in the inframammary fold which is minimally dehisced. Both axillae are benign.   LAB RESULTS:  CMP     Component Value Date/Time   NA 139 11/21/2015 0931   NA 140 10/23/2015 1228   K 4.0 11/21/2015 0931   K 3.8 10/23/2015 1228   CL 105 11/21/2015 0931   CO2 27 11/21/2015 0931   CO2 27 10/23/2015 1228   GLUCOSE 93 11/21/2015 0931   GLUCOSE 96 10/23/2015 1228   BUN 10 11/21/2015 0931   BUN 9.9 10/23/2015 1228   CREATININE 0.95 11/21/2015 0931   CREATININE 1.0 10/23/2015 1228   CALCIUM 9.3 11/21/2015 0931   CALCIUM 9.9 10/23/2015 1228   PROT 7.9 10/23/2015 1228   ALBUMIN 3.7 10/23/2015 1228   AST 16 10/23/2015 1228   ALT 19 10/23/2015 1228   ALKPHOS 142  10/23/2015 1228   BILITOT 0.46 10/23/2015 1228   GFRNONAA >60 11/21/2015 0931   GFRAA >60 11/21/2015 0931    INo results found for: SPEP, UPEP  Lab Results  Component Value Date   WBC 5.4 11/21/2015   NEUTROABS 4.7 10/23/2015   HGB 13.1 11/21/2015   HCT 40.9 11/21/2015   MCV 89.7 11/21/2015   PLT 277 11/21/2015      Chemistry      Component Value Date/Time  NA 139 11/21/2015 0931   NA 140 10/23/2015 1228   K 4.0 11/21/2015 0931   K 3.8 10/23/2015 1228   CL 105 11/21/2015 0931   CO2 27 11/21/2015 0931   CO2 27 10/23/2015 1228   BUN 10 11/21/2015 0931   BUN 9.9 10/23/2015 1228   CREATININE 0.95 11/21/2015 0931   CREATININE 1.0 10/23/2015 1228      Component Value Date/Time   CALCIUM 9.3 11/21/2015 0931   CALCIUM 9.9 10/23/2015 1228   ALKPHOS 142 10/23/2015 1228   AST 16 10/23/2015 1228   ALT 19 10/23/2015 1228   BILITOT 0.46 10/23/2015 1228       No results found for: LABCA2  No components found for: LABCA125  No results for input(s): INR in the last 168 hours.  Urinalysis No results found for: COLORURINE, APPEARANCEUR, LABSPEC, PHURINE, GLUCOSEU, HGBUR, BILIRUBINUR, KETONESUR, PROTEINUR, UROBILINOGEN, NITRITE, LEUKOCYTESUR   STUDIES: Nm Sentinel Node Inj-no Rpt (breast)  Result Date: 11/28/2015 CLINICAL DATA: right breast cancer Sulfur colloid was injected intradermally by the nuclear medicine technologist for breast cancer sentinel node localization.   Mm Breast Surgical Specimen  Result Date: 11/28/2015 CLINICAL DATA:  Post right breast lumpectomy for recently diagnosed grade 2 invasive mammary carcinoma and mammary carcinoma in situ. EXAM: SPECIMEN RADIOGRAPH OF THE RIGHT BREAST COMPARISON:  Previous exam(s). FINDINGS: Status post excision of the right breast. The two wire tips and biopsy marker clip are present and are marked for pathology. Diffuse calcifications are present in the specimen radiograph, with the most calcifications concentrated in the  region of the coil biopsy marking clip. IMPRESSION: Specimen radiograph of the right breast. Electronically Signed   By: Everlean Alstrom M.D.   On: 11/28/2015 14:33   Mm Rt Plc Breast Loc Dev   1st Lesion  Inc Mammo Guide  Result Date: 11/28/2015 CLINICAL DATA:  50 year old female with recently diagnosed grade 2 invasive mammary carcinoma and mammary carcinoma in situ post stereotactic guided biopsy of calcifications and distortion in the upper inner right breast on 10/15/2015, with calcifications spanning a distance of approximately 5.4 cm. Patient presents for bracketing of calcifications prior to planned lumpectomy and bilateral breast reduction. EXAM: NEEDLE LOCALIZATION OF THE RIGHT BREAST WITH MAMMO GUIDANCE COMPARISON:  Previous exams. FINDINGS: Patient presents for needle localization prior to right breast lumpectomy. I met with the patient and we discussed the procedure of needle localization including benefits and alternatives. We discussed the high likelihood of a successful procedure. We discussed the risks of the procedure, including infection, bleeding, tissue injury, and further surgery. Informed, written consent was given. The usual time-out protocol was performed immediately prior to the procedure. SITE 1: CALCIFICATIONS UPPER INNER POSTERIOR DEPTH RIGHT BREAST: Using mammographic guidance, sterile technique, 1% lidocaine and a 11 cm modified Kopans needle, the more posteriorly located calcifications in the upper inner right breast adjacent to the coil shaped biopsy marking clip were localized using medial to lateral approach. SITE 2: CALCIFICATIONS UPPER INNER MIDDLE DEPTH RIGHT BREAST: Using mammographic guidance, sterile technique, 1% lidocaine and a 9 cm modified Kopans needle, the anterior aspect of the calcifications in the upper inner right breast were localized using medial to lateral approach. The images were marked for Dr. Marlou Starks. IMPRESSION: Needle localization with bracketing of the  calcifications and distortion in the upper inner right breast breast. No apparent complications. Electronically Signed   By: Everlean Alstrom M.D.   On: 11/28/2015 08:51   Mm Rt Plc Breast Loc Dev   Ea Add  Lesion  Inc Mammo Guide  Result Date: 11/28/2015 CLINICAL DATA:  50 year old female with recently diagnosed grade 2 invasive mammary carcinoma and mammary carcinoma in situ post stereotactic guided biopsy of calcifications and distortion in the upper inner right breast on 10/15/2015, with calcifications spanning a distance of approximately 5.4 cm. Patient presents for bracketing of calcifications prior to planned lumpectomy and bilateral breast reduction. EXAM: NEEDLE LOCALIZATION OF THE RIGHT BREAST WITH MAMMO GUIDANCE COMPARISON:  Previous exams. FINDINGS: Patient presents for needle localization prior to right breast lumpectomy. I met with the patient and we discussed the procedure of needle localization including benefits and alternatives. We discussed the high likelihood of a successful procedure. We discussed the risks of the procedure, including infection, bleeding, tissue injury, and further surgery. Informed, written consent was given. The usual time-out protocol was performed immediately prior to the procedure. SITE 1: CALCIFICATIONS UPPER INNER POSTERIOR DEPTH RIGHT BREAST: Using mammographic guidance, sterile technique, 1% lidocaine and a 11 cm modified Kopans needle, the more posteriorly located calcifications in the upper inner right breast adjacent to the coil shaped biopsy marking clip were localized using medial to lateral approach. SITE 2: CALCIFICATIONS UPPER INNER MIDDLE DEPTH RIGHT BREAST: Using mammographic guidance, sterile technique, 1% lidocaine and a 9 cm modified Kopans needle, the anterior aspect of the calcifications in the upper inner right breast were localized using medial to lateral approach. The images were marked for Dr. Marlou Starks. IMPRESSION: Needle localization with bracketing  of the calcifications and distortion in the upper inner right breast breast. No apparent complications. Electronically Signed   By: Everlean Alstrom M.D.   On: 11/28/2015 08:51    ELIGIBLE FOR AVAILABLE RESEARCH PROTOCOL: PALLAS  ASSESSMENT: 50 y.o. [place] woman status post right breast upper inner quadrant biopsy 10/15/2015 for a clinical T2-T3 N0, stage II invasive ductal carcinoma, estrogen and progesterone receptor positive, HER-2 nonamplified, with an Mib-1 of 70%  (1) status post right lumpectomy and sentinel lymph node sampling with uncle plastic breast reduction 11/28/2015 for a pT2 pN1, stage IIB invasive ductal carcinoma, grade 1, with negative margins  (2) left reduction mammoplasty 11/28/2015 unexpectedly found a pT1c pNX invasive ductal carcinoma, grade 1, estrogen and progesterone receptor positive, HER-2 not amplified, with an MIB-1 of 3%  (3) Mammaprint from the right sided breast cancer was "low risk", redictating a 98% chance of no disease recurrence within 5 years with anti-estrogens as the only systemic therapy. It also predicts minimal to no benefit from chemotherapy  (4) the patient is scheduled for bilateral mastectomies 12/26/2015, with an attempt at left-sided sentinel lymph node sampling  (5) adjuvant radiation to follow as appropriate  (6) started on tamoxifen neoadjuvantly 10/23/2015 to allow for expected delays in definitive local treatment  PLAN: Sabino Snipes was very concerned at the unexpectedly found cancer in the contralateral breast. Her decision for bilateral mastectomies is not at all unreasonable given the fact that this was totally unsuspected despite all the studies she underwent. At this point she is not interested in reconstruction.  I discussed her situation with Dr. Marlou Starks and with Dr. [name]. It is our feeling that she would be at very high risk of disabling lymphedema on the right if we pursued a full axillary lymph node dissection. Accordingly  instead of further surgery on the right she will have some coverage of the axilla with radiation.  Whether she needs radiation on the left side will depend on the success of the attempt at sentinel lymph node sampling and what the  final pathologic results will be.  Today we discussed the Mammaprint results which are very favorable. The good news for Sabino Snipes of course is that she will not need chemotherapy and yet will have a very good prognosis.  The other good news is that she is tolerating the tamoxifen without any significant side effects.  She will see me again in approximately 2 months. She knows to call for any problems that may develop before that visit   Chauncey Cruel, MD   12/18/2015 9:37 AM Medical Oncology and Hematology [facility] [place], [place] [address] Tel. [phone]    Fax. [phone]

## 2015-12-18 NOTE — Progress Notes (Signed)
Radiation Oncology         [phone] ________________________________  Name: ARLY SALMINEN MRN: [id number]  Date: 12/18/2015  DOB: [birth year]  Follow-Up Visit Note  Outpatient  CC: Osborne Casco, MD  Jovita Kussmaul, MD  Diagnosis:      ICD-9-CM ICD-10-CM   1. Breast cancer, female, right 174.9 C50.911 Ambulatory referral to Social Work  2. Breast cancer of upper-inner quadrant of right female breast ([facility]) 174.2 C50.211   3. Cancer of central portion of left breast ([facility]) 174.1 C50.112     Bilateral breast cancer, Right Breast pathologic Stage T2N1a clinical M0; Left Breast, pathologic stage T1cNX  Right breast histology reveals grade I invasive ductal carcinoma with DCIS, ER+, PR+, HER2 - negative Left breast histology reveals grade I invasive ductal carcinoma with DCIS and LCIS, ER+, PR+, HER-negative.    Narrative:  The patient returns today for follow-up.  She presents to clinic today for a follow up after an initial consultation on 10/23/2015 after a recent diagnosis of breast cancer.  She has been kindly referred to me by Dr. [name].  The patient is also currently being seen by Dr. [name] in medical oncology, receiving adjuvant chemotherapy, tamoxifen.     Since consultation, she underwent right breast lumpectomy  with needle localization and axillary sentinel lymph node biopsy, and a mammary reduction of the bilateral breast, both performed on 11/28/2015 concurrently. Biopsy revealed  invasive ductal carcinoma, Grade I/III, spanning 2.3 cm and 1/1 positive nodes with extra capsular extension. Margins were negative on the right. Please see above for histologic details. Left breast mammoplasty was also performed.  This incidentally showed at least 1.2cm of cancer with histology as noted above in the diagnosis. The margins cannot be accurately assessed. But there is DCIS at a cauterized tissue edge. No nodes were taken on the left side.   The patient anticipates  bilateral mastectomy with Dr. Marlou Starks.   The patient indicated that she currently feels "twinges" of pain at her surgical sites.   She is here today to consider her options for radiotherapy with me.           ALLERGIES:  is allergic to ace inhibitors; atorvastatin; and simvastatin.  Meds: Current Outpatient Prescriptions  Medication Sig Dispense Refill  . acetaminophen (TYLENOL) 325 MG tablet Take 650 mg by mouth every 6 (six) hours as needed for mild pain. Reported on 10/23/2015    . Cetirizine HCl (ZYRTEC ALLERGY PO) Take 1 tablet by mouth daily as needed (allergies).     . hydrochlorothiazide (MICROZIDE) 12.5 MG capsule Take 12.5 mg by mouth daily.    Marland Kitchen levonorgestrel (MIRENA) 20 MCG/24HR IUD 1 each by Intrauterine route once.    Marland Kitchen levothyroxine (SYNTHROID, LEVOTHROID) 125 MCG tablet Take 125 mcg by mouth daily.    Marland Kitchen LIVALO 2 MG TABS Take 0.2 mg by mouth daily.   6  . losartan (COZAAR) 100 MG tablet Take 100 mg by mouth daily.    . metFORMIN (GLUCOPHAGE-XR) 750 MG 24 hr tablet Take 750 mg by mouth every evening.   6  . potassium chloride SA (K-DUR,KLOR-CON) 20 MEQ tablet Take 40 mEq by mouth 2 (two) times daily.     . calcium carbonate (OS-CAL) 600 MG TABS Take 600 mg by mouth daily.    Marland Kitchen enoxaparin (LOVENOX) 40 MG/0.4ML injection Inject 0.4 mLs (40 mg total) into the skin daily. (Patient not taking: Reported on 12/18/2015) 12 Syringe 0  . fish oil-omega-3 fatty acids 1000 MG  capsule Take 1 g by mouth daily.    . methocarbamol (ROBAXIN) 500 MG tablet Take 1 tablet (500 mg total) by mouth every 6 (six) hours as needed for muscle spasms. (Patient not taking: Reported on 12/18/2015) 40 tablet 0  . Multiple Vitamins-Minerals (MULTIVITAMIN WITH MINERALS) tablet Take 1 tablet by mouth daily.    Marland Kitchen oxyCODONE-acetaminophen (ROXICET) 5-325 MG tablet Take 1-2 tablets by mouth every 4 (four) hours as needed. (Patient not taking: Reported on 12/18/2015) 50 tablet 0   No current facility-administered  medications for this encounter.     Physical Findings:  height is _0  (1.676 m) and weight is 244 lb 11.2 oz (111 kg). Her temperature is 98.4 F (36.9 C). Her blood pressure is 131/77 and her pulse is 81. .       General: Alert and oriented, in no acute distress HEENT: Head is normocephalic. Extraocular movements are intact. Oropharynx is clear, but is notable for upper dentures.  Neck: Neck is supple, no palpable cervical or supraclavicular lymphadenopathy. Heart: Regular in rate and rhythm, notable for a soft systolic murmur at the aortic region, no rubs, or gallops. Chest: Clear to auscultation bilaterally, with no rhonchi, wheezes, or rales. Abdomen: Soft, nontender, nondistended, with no rigidity or guarding. Extremities: No cyanosis or edema. Lymphatics: see Neck Exam Psychiatric: Judgment and insight are intact. Affect is appropriate. Breast exam reveals: bilateral mammoplasty scars are healing with some continued drainage particularly on the left side.   Lab Findings: Lab Results  Component Value Date   WBC 5.4 11/21/2015   HGB 13.1 11/21/2015   HCT 40.9 11/21/2015   MCV 89.7 11/21/2015   PLT 277 11/21/2015    Radiographic Findings: Nm Sentinel Node Inj-no Rpt (breast)  Result Date: 11/28/2015 CLINICAL DATA: right breast cancer Sulfur colloid was injected intradermally by the nuclear medicine technologist for breast cancer sentinel node localization.   Mm Breast Surgical Specimen  Result Date: 11/28/2015 CLINICAL DATA:  Post right breast lumpectomy for recently diagnosed grade 2 invasive mammary carcinoma and mammary carcinoma in situ. EXAM: SPECIMEN RADIOGRAPH OF THE RIGHT BREAST COMPARISON:  Previous exam(s). FINDINGS: Status post excision of the right breast. The two wire tips and biopsy marker clip are present and are marked for pathology. Diffuse calcifications are present in the specimen radiograph, with the most calcifications concentrated in the region of the  coil biopsy marking clip. IMPRESSION: Specimen radiograph of the right breast. Electronically Signed   By: Everlean Alstrom M.D.   On: 11/28/2015 14:33   Mm Rt Plc Breast Loc Dev   1st Lesion  Inc Mammo Guide  Result Date: 11/28/2015 CLINICAL DATA:  50 year old female with recently diagnosed grade 2 invasive mammary carcinoma and mammary carcinoma in situ post stereotactic guided biopsy of calcifications and distortion in the upper inner right breast on 10/15/2015, with calcifications spanning a distance of approximately 5.4 cm. Patient presents for bracketing of calcifications prior to planned lumpectomy and bilateral breast reduction. EXAM: NEEDLE LOCALIZATION OF THE RIGHT BREAST WITH MAMMO GUIDANCE COMPARISON:  Previous exams. FINDINGS: Patient presents for needle localization prior to right breast lumpectomy. I met with the patient and we discussed the procedure of needle localization including benefits and alternatives. We discussed the high likelihood of a successful procedure. We discussed the risks of the procedure, including infection, bleeding, tissue injury, and further surgery. Informed, written consent was given. The usual time-out protocol was performed immediately prior to the procedure. SITE 1: CALCIFICATIONS UPPER INNER POSTERIOR DEPTH RIGHT BREAST: Using  mammographic guidance, sterile technique, 1% lidocaine and a 11 cm modified Kopans needle, the more posteriorly located calcifications in the upper inner right breast adjacent to the coil shaped biopsy marking clip were localized using medial to lateral approach. SITE 2: CALCIFICATIONS UPPER INNER MIDDLE DEPTH RIGHT BREAST: Using mammographic guidance, sterile technique, 1% lidocaine and a 9 cm modified Kopans needle, the anterior aspect of the calcifications in the upper inner right breast were localized using medial to lateral approach. The images were marked for Dr. Marlou Starks. IMPRESSION: Needle localization with bracketing of the calcifications  and distortion in the upper inner right breast breast. No apparent complications. Electronically Signed   By: Everlean Alstrom M.D.   On: 11/28/2015 08:51   Mm Rt Plc Breast Loc Dev   Ea Add Lesion  Inc Mammo Guide  Result Date: 11/28/2015 CLINICAL DATA:  50 year old female with recently diagnosed grade 2 invasive mammary carcinoma and mammary carcinoma in situ post stereotactic guided biopsy of calcifications and distortion in the upper inner right breast on 10/15/2015, with calcifications spanning a distance of approximately 5.4 cm. Patient presents for bracketing of calcifications prior to planned lumpectomy and bilateral breast reduction. EXAM: NEEDLE LOCALIZATION OF THE RIGHT BREAST WITH MAMMO GUIDANCE COMPARISON:  Previous exams. FINDINGS: Patient presents for needle localization prior to right breast lumpectomy. I met with the patient and we discussed the procedure of needle localization including benefits and alternatives. We discussed the high likelihood of a successful procedure. We discussed the risks of the procedure, including infection, bleeding, tissue injury, and further surgery. Informed, written consent was given. The usual time-out protocol was performed immediately prior to the procedure. SITE 1: CALCIFICATIONS UPPER INNER POSTERIOR DEPTH RIGHT BREAST: Using mammographic guidance, sterile technique, 1% lidocaine and a 11 cm modified Kopans needle, the more posteriorly located calcifications in the upper inner right breast adjacent to the coil shaped biopsy marking clip were localized using medial to lateral approach. SITE 2: CALCIFICATIONS UPPER INNER MIDDLE DEPTH RIGHT BREAST: Using mammographic guidance, sterile technique, 1% lidocaine and a 9 cm modified Kopans needle, the anterior aspect of the calcifications in the upper inner right breast were localized using medial to lateral approach. The images were marked for Dr. Marlou Starks. IMPRESSION: Needle localization with bracketing of the  calcifications and distortion in the upper inner right breast breast. No apparent complications. Electronically Signed   By: Everlean Alstrom M.D.   On: 11/28/2015 08:51    Impression/Plan: bilateral breast cancer We discussed adjuvant radiotherapy today.  I recommend 7 weeks of radiotherapy in order to to treat her right chest wall and nodes, and possibly her left, depending on final path.  The risks, benefits and side effects of this treatment were discussed in detail.  She understands that radiotherapy is associated with skin irritation and fatigue in the acute setting. Late effects can include cosmetic changes and rare injury to internal organs.   She is enthusiastic about proceeding with treatment. A consent form has been  signed and placed in her chart.  The patient will undergo bilateral breast mastectomy next Thursday on the 17th with Dr. Marlou Starks. I spoke with him about this.  I think it's reasonable to spare her from an ALND on the right as I will treat that area with RT and she is at high risk otherwise for lymphedema.  On the left, he will attempt SLN bx. The patient indicates that she would not like to undergo reconstructive plastic surgery. I will see the patient at least one  month after her surgical procedure to undergo planning and subsequently, treatment. I'll wait for her referral back to me.   This document serves as a record of services personally performed by [name] , MD. It was created on her behalf by Truddie Hidden, a trained medical scribe. The creation of this record is based on the scribe's personal observations and the provider's statements to them. This document has been checked and approved by the attending provider.     _____________________________________   [name], MD

## 2015-12-21 NOTE — Pre-Procedure Instructions (Signed)
Debbie [name]  12/21/2015       Your procedure is scheduled on August 17.  Report to [facility] Admitting at 5:30 A.M.  Call this number if you have problems the morning of surgery:  [phone]   Remember:  Do not eat food or drink liquids after midnight.  Take these medicines the morning of surgery with A SIP OF WATER Tylenol (if needed), Zyrtec, Levothyroxine, Tamoxifen   STOP/ Do not take Aspirin, Aleve, Naproxen, Advil, Ibuprofen, Motrin, Vitamins, Herbs, or Supplements starting today     How to Manage Your Diabetes Before and After Surgery  Why is it important to control my blood sugar before and after surgery? . Improving blood sugar levels before and after surgery helps healing and can limit problems. . A way of improving blood sugar control is eating a healthy diet by: o  Eating less sugar and carbohydrates o  Increasing activity/exercise o  Talking with your doctor about reaching your blood sugar goals . High blood sugars (greater than 180 mg/dL) can raise your risk of infections and slow your recovery, so you will need to focus on controlling your diabetes during the weeks before surgery. . Make sure that the doctor who takes care of your diabetes knows about your planned surgery including the date and location.  How do I manage my blood sugar before surgery? . Check your blood sugar at least 4 times a day, starting 2 days before surgery, to make sure that the level is not too high or low. o Check your blood sugar the morning of your surgery when you wake up and every 2 hours until you get to the Short Stay unit. . If your blood sugar is less than 70 mg/dL, you will need to treat for low blood sugar: o Do not take insulin. o Treat a low blood sugar (less than 70 mg/dL) with  cup of clear juice (cranberry or apple), 4 glucose tablets, OR glucose gel. o Recheck blood sugar in 15 minutes after treatment (to make sure it is greater than 70 mg/dL). If  your blood sugar is not greater than 70 mg/dL on recheck, call [phone] for further instructions. . Report your blood sugar to the short stay nurse when you get to Short Stay.  . If you are admitted to the hospital after surgery: o Your blood sugar will be checked by the staff and you will probably be given insulin after surgery (instead of oral diabetes medicines) to make sure you have good blood sugar levels. o The goal for blood sugar control after surgery is 80-180 mg/dL.   WHAT DO I DO ABOUT MY DIABETES MEDICATION?  Marland Kitchen Do not take oral diabetes medicines (pills) the morning of surgery.    Do not wear jewelry, make-up or nail polish.  Do not wear lotions, powders, or perfumes.  You may wear deoderant.  Do not shave 48 hours prior to surgery.  Men may shave face and neck.  Do not bring valuables to the hospital.  [facility] is not responsible for any belongings or valuables.  Contacts, dentures or bridgework may not be worn into surgery.  Leave your suitcase in the car.  After surgery it may be brought to your room.  For patients admitted to the hospital, discharge time will be determined by your treatment team.  Patients discharged the day of surgery will not be allowed to drive home.   [facility] - Preparing for Surgery  Before  surgery, you can play an important role.  Because skin is not sterile, your skin needs to be as free of germs as possible.  You can reduce the number of germs on you skin by washing with CHG (chlorahexidine gluconate) soap before surgery.  CHG is an antiseptic cleaner which kills germs and bonds with the skin to continue killing germs even after washing.  Please DO NOT use if you have an allergy to CHG or antibacterial soaps.  If your skin becomes reddened/irritated stop using the CHG and inform your nurse when you arrive at Short Stay.  Do not shave (including legs and underarms) for at least 48 hours prior to the first CHG shower.  You may shave your  face.  Please follow these instructions carefully:   1.  Shower with CHG Soap the night before surgery and the morning of Surgery.  2.  If you choose to wash your hair, wash your hair first as usual with your normal shampoo.  3.  After you shampoo, rinse your hair and body thoroughly to remove the shampoo.  4.  Use CHG as you would any other liquid soap.  You can apply CHG directly to the skin and wash gently with scrungie or a clean washcloth.  5.  Apply the CHG Soap to your body ONLY FROM THE NECK DOWN.  Do not use on open wounds or open sores.  Avoid contact with your eyes, ears, mouth and genitals (private parts).  Wash genitals (private parts) with your normal soap.  6.  Wash thoroughly, paying special attention to the area where your surgery will be performed.  7.  Thoroughly rinse your body with warm water from the neck down.  8.  DO NOT shower/wash with your normal soap after using and rinsing off the CHG Soap.  9.  Pat yourself dry with a clean towel.            10.  Wear clean pajamas.            11.  Place clean sheets on your bed the night of your first shower and do not sleep with pets.  Day of Surgery  Do not apply any lotions the morning of surgery.  Please wear clean clothes to the hospital/surgery center.

## 2015-12-23 ENCOUNTER — Encounter (HOSPITAL_COMMUNITY)
Admission: RE | Admit: 2015-12-23 | Discharge: 2015-12-23 | Disposition: A | Discharge status: Home / Self Care | Payer: 59 | Source: Ambulatory Visit | Attending: General Surgery | Admitting: General Surgery

## 2015-12-23 ENCOUNTER — Encounter (HOSPITAL_COMMUNITY): Payer: Self-pay

## 2015-12-23 DIAGNOSIS — D0582 Other specified type of carcinoma in situ of left breast: Secondary | ICD-10-CM | POA: Diagnosis not present

## 2015-12-23 DIAGNOSIS — Z6839 Body mass index (BMI) 39.0-39.9, adult: Secondary | ICD-10-CM | POA: Diagnosis not present

## 2015-12-23 DIAGNOSIS — C50411 Malignant neoplasm of upper-outer quadrant of right female breast: Secondary | ICD-10-CM | POA: Diagnosis not present

## 2015-12-23 DIAGNOSIS — E039 Hypothyroidism, unspecified: Secondary | ICD-10-CM | POA: Diagnosis not present

## 2015-12-23 DIAGNOSIS — I10 Essential (primary) hypertension: Secondary | ICD-10-CM | POA: Diagnosis not present

## 2015-12-23 DIAGNOSIS — Z7984 Long term (current) use of oral hypoglycemic drugs: Secondary | ICD-10-CM | POA: Diagnosis not present

## 2015-12-23 DIAGNOSIS — C50912 Malignant neoplasm of unspecified site of left female breast: Secondary | ICD-10-CM | POA: Diagnosis present

## 2015-12-23 DIAGNOSIS — L7634 Postprocedural seroma of skin and subcutaneous tissue following other procedure: Secondary | ICD-10-CM | POA: Diagnosis not present

## 2015-12-23 DIAGNOSIS — Z170 Estrogen receptor positive status [ER+]: Secondary | ICD-10-CM | POA: Diagnosis not present

## 2015-12-23 DIAGNOSIS — E785 Hyperlipidemia, unspecified: Secondary | ICD-10-CM | POA: Diagnosis not present

## 2015-12-23 DIAGNOSIS — Z79899 Other long term (current) drug therapy: Secondary | ICD-10-CM | POA: Diagnosis not present

## 2015-12-23 DIAGNOSIS — Z79810 Long term (current) use of selective estrogen receptor modulators (SERMs): Secondary | ICD-10-CM | POA: Diagnosis not present

## 2015-12-23 DIAGNOSIS — Z793 Long term (current) use of hormonal contraceptives: Secondary | ICD-10-CM | POA: Diagnosis not present

## 2015-12-23 DIAGNOSIS — E669 Obesity, unspecified: Secondary | ICD-10-CM | POA: Diagnosis not present

## 2015-12-23 LAB — BASIC METABOLIC PANEL
Anion gap: 10 (ref 5–15)
BUN: 7 mg/dL (ref 6–20)
CO2: 23 mmol/L (ref 22–32)
Calcium: 9.5 mg/dL (ref 8.9–10.3)
Chloride: 105 mmol/L (ref 101–111)
Creatinine, Ser: 0.82 mg/dL (ref 0.44–1.00)
GFR calc Af Amer: >60 mL/min (ref >60)
GFR calc non Af Amer: >60 mL/min (ref >60)
Glucose, Bld: 93 mg/dL (ref 65–99)
Potassium: 4.1 mmol/L (ref 3.5–5.1)
Sodium: 138 mmol/L (ref 135–145)

## 2015-12-23 LAB — CBC
HCT: 39.6 % (ref 36.0–46.0)
Hemoglobin: 12.2 g/dL (ref 12.0–15.0)
MCH: 28 pg (ref 26.0–34.0)
MCHC: 30.8 g/dL (ref 30.0–36.0)
MCV: 90.8 fL (ref 78.0–100.0)
Platelets: 256 10*3/uL (ref 150–400)
RBC: 4.36 MIL/uL (ref 3.87–5.11)
RDW: 13.8 % (ref 11.5–15.5)
WBC: 3.7 10*3/uL — ABNORMAL LOW (ref 4.0–10.5)

## 2015-12-23 LAB — HCG, SERUM, QUALITATIVE: Preg, Serum: NEGATIVE

## 2015-12-23 NOTE — Progress Notes (Signed)
PCP is Kelton Pillar Cardiologist is Dr Tamala Julian (she states she has not seen him in a while) Ob gyn is Dr. Leo Grosser She states that she takes Metformin for weight control. See Allison's note from 11-21-15 States she has a blister to her left breast. Area is open, states it doesn't drain. Dr Ethlyn Gallery office called (spoke with Claiborne Billings) and informed of open area to left breast. Also informed we do not have orders.

## 2015-12-24 ENCOUNTER — Other Ambulatory Visit: Payer: Self-pay | Admitting: General Surgery

## 2015-12-24 DIAGNOSIS — C50912 Malignant neoplasm of unspecified site of left female breast: Secondary | ICD-10-CM | POA: Diagnosis not present

## 2015-12-24 DIAGNOSIS — C50111 Malignant neoplasm of central portion of right female breast: Secondary | ICD-10-CM

## 2015-12-24 DIAGNOSIS — C50911 Malignant neoplasm of unspecified site of right female breast: Secondary | ICD-10-CM | POA: Diagnosis not present

## 2015-12-24 DIAGNOSIS — C50112 Malignant neoplasm of central portion of left female breast: Secondary | ICD-10-CM

## 2015-12-24 MED FILL — KLOR-CON M20 TABLET: 20 | 30 days supply | Qty: 60 | Fill #5

## 2015-12-25 MED ORDER — CEFAZOLIN SODIUM-DEXTROSE 2-4 GM/100ML-% IV SOLN
2.0000 g | INTRAVENOUS | Status: AC
Start: 2015-12-26 — End: 2015-12-26
  Administered 2015-12-26: 2 g via INTRAVENOUS
  Filled 2015-12-25 (×2): qty 100

## 2015-12-25 NOTE — Anesthesia Preprocedure Evaluation (Addendum)
Anesthesia Evaluation  Patient identified by MRN, date of birth, ID band Patient awake    Reviewed: Allergy & Precautions, NPO status , Patient's Chart, lab work & pertinent test results  History of Anesthesia Complications (+) Family history of anesthesia reaction and history of anesthetic complications (mother required postop ventilation for COPD)  Airway Mallampati: III  TM Distance: >3 FB Neck ROM: Full   Comment: Previous grade I view with [facility] Dental  (+) Edentulous Upper, Dental Advisory Given   Pulmonary neg pulmonary ROS, neg shortness of breath, neg sleep apnea, neg COPD, neg recent URI,    Pulmonary exam normal breath sounds clear to auscultation       Cardiovascular hypertension, Pt. on medications (-) angina(-) Past MI, (-) Cardiac Stents, (-) Orthopnea and (-) PND + dysrhythmias (EKG from 11/21/15 shows NSR) + Valvular Problems/Murmurs  Rhythm:Regular Rate:Normal     Neuro/Psych  Headaches, neg Seizures PSYCHIATRIC DISORDERS Anxiety Depression    GI/Hepatic negative GI ROS, Neg liver ROS,   Endo/Other  diabetes (patient denies and says the metformin is for weight control), Type 2, Oral Hypoglycemic AgentsHypothyroidism   Renal/GU negative Renal ROS  Female GU complaint (urinary incontinence)     Musculoskeletal   Abdominal (+) + obese,   Peds  Hematology negative hematology ROS (+)   Anesthesia Other Findings HLD, bilateral breast cancer   Reproductive/Obstetrics                          Anesthesia Physical Anesthesia Plan  ASA: III  Anesthesia Plan: General   Post-op Pain Management:    Induction: Intravenous  Airway Management Planned: Oral ETT  Additional Equipment:   Intra-op Plan:   Post-operative Plan: Extubation in OR  Informed Consent: I have reviewed the patients History and Physical, chart, labs and discussed the procedure including the risks, benefits  and alternatives for the proposed anesthesia with the patient or authorized representative who has indicated his/her understanding and acceptance.   Dental advisory given  Plan Discussed with:   Anesthesia Plan Comments: (Offered patient PECs block for postop pain control. Patient declined block as she states her pain was well controlled after her previous surgery without a block. She does consent to possible postop block if pain is inadequately controlled.)       Anesthesia Quick Evaluation

## 2015-12-26 ENCOUNTER — Encounter (HOSPITAL_COMMUNITY)
Admission: RE | Disposition: A | Discharge status: Home / Self Care | Payer: Self-pay | Source: Ambulatory Visit | Attending: General Surgery

## 2015-12-26 ENCOUNTER — Ambulatory Visit (HOSPITAL_COMMUNITY): Payer: 59 | Admitting: Anesthesiology

## 2015-12-26 ENCOUNTER — Ambulatory Visit (HOSPITAL_COMMUNITY)
Admission: RE | Admit: 2015-12-26 | Discharge: 2015-12-27 | Disposition: A | Discharge status: Home / Self Care | Payer: 59 | Source: Ambulatory Visit | Attending: General Surgery | Admitting: General Surgery

## 2015-12-26 ENCOUNTER — Encounter (HOSPITAL_COMMUNITY)
Admission: RE | Admit: 2015-12-26 | Discharge: 2015-12-26 | Disposition: A | Discharge status: Home / Self Care | Payer: 59 | Source: Ambulatory Visit | Attending: General Surgery | Admitting: General Surgery

## 2015-12-26 ENCOUNTER — Encounter (HOSPITAL_COMMUNITY): Payer: Self-pay | Admitting: *Deleted

## 2015-12-26 DIAGNOSIS — C50412 Malignant neoplasm of upper-outer quadrant of left female breast: Secondary | ICD-10-CM | POA: Diagnosis not present

## 2015-12-26 DIAGNOSIS — Z79810 Long term (current) use of selective estrogen receptor modulators (SERMs): Secondary | ICD-10-CM | POA: Insufficient documentation

## 2015-12-26 DIAGNOSIS — E039 Hypothyroidism, unspecified: Secondary | ICD-10-CM | POA: Insufficient documentation

## 2015-12-26 DIAGNOSIS — L7634 Postprocedural seroma of skin and subcutaneous tissue following other procedure: Secondary | ICD-10-CM | POA: Insufficient documentation

## 2015-12-26 DIAGNOSIS — C50211 Malignant neoplasm of upper-inner quadrant of right female breast: Secondary | ICD-10-CM | POA: Diagnosis not present

## 2015-12-26 DIAGNOSIS — Z170 Estrogen receptor positive status [ER+]: Secondary | ICD-10-CM | POA: Diagnosis not present

## 2015-12-26 DIAGNOSIS — Z6839 Body mass index (BMI) 39.0-39.9, adult: Secondary | ICD-10-CM | POA: Insufficient documentation

## 2015-12-26 DIAGNOSIS — D0582 Other specified type of carcinoma in situ of left breast: Secondary | ICD-10-CM | POA: Diagnosis not present

## 2015-12-26 DIAGNOSIS — N6489 Other specified disorders of breast: Secondary | ICD-10-CM | POA: Diagnosis not present

## 2015-12-26 DIAGNOSIS — I10 Essential (primary) hypertension: Secondary | ICD-10-CM | POA: Insufficient documentation

## 2015-12-26 DIAGNOSIS — N6031 Fibrosclerosis of right breast: Secondary | ICD-10-CM | POA: Diagnosis not present

## 2015-12-26 DIAGNOSIS — Z7984 Long term (current) use of oral hypoglycemic drugs: Secondary | ICD-10-CM | POA: Insufficient documentation

## 2015-12-26 DIAGNOSIS — C50912 Malignant neoplasm of unspecified site of left female breast: Secondary | ICD-10-CM | POA: Insufficient documentation

## 2015-12-26 DIAGNOSIS — E669 Obesity, unspecified: Secondary | ICD-10-CM | POA: Diagnosis not present

## 2015-12-26 DIAGNOSIS — Z79899 Other long term (current) drug therapy: Secondary | ICD-10-CM | POA: Insufficient documentation

## 2015-12-26 DIAGNOSIS — D0502 Lobular carcinoma in situ of left breast: Secondary | ICD-10-CM | POA: Diagnosis not present

## 2015-12-26 DIAGNOSIS — C50111 Malignant neoplasm of central portion of right female breast: Secondary | ICD-10-CM

## 2015-12-26 DIAGNOSIS — C50411 Malignant neoplasm of upper-outer quadrant of right female breast: Secondary | ICD-10-CM | POA: Diagnosis not present

## 2015-12-26 DIAGNOSIS — Z793 Long term (current) use of hormonal contraceptives: Secondary | ICD-10-CM | POA: Insufficient documentation

## 2015-12-26 DIAGNOSIS — C50911 Malignant neoplasm of unspecified site of right female breast: Secondary | ICD-10-CM | POA: Diagnosis present

## 2015-12-26 DIAGNOSIS — C50112 Malignant neoplasm of central portion of left female breast: Secondary | ICD-10-CM

## 2015-12-26 DIAGNOSIS — E785 Hyperlipidemia, unspecified: Secondary | ICD-10-CM | POA: Insufficient documentation

## 2015-12-26 HISTORY — PX: MASTECTOMY W/ SENTINEL NODE BIOPSY: SHX2001

## 2015-12-26 LAB — GLUCOSE, CAPILLARY
Glucose-Capillary: 84 mg/dL (ref 65–99)
Glucose-Capillary: 138 mg/dL — ABNORMAL HIGH (ref 65–99)

## 2015-12-26 SURGERY — MASTECTOMY WITH SENTINEL LYMPH NODE BIOPSY
Anesthesia: General | Site: Breast | Laterality: Bilateral

## 2015-12-26 MED ORDER — FENTANYL CITRATE (PF) 100 MCG/2ML IJ SOLN
INTRAMUSCULAR | Status: AC
  Filled 2015-12-26: qty 2

## 2015-12-26 MED ORDER — ACETAMINOPHEN 10 MG/ML IV SOLN
INTRAVENOUS | Status: AC
  Filled 2015-12-26: qty 100

## 2015-12-26 MED ORDER — LEVOTHYROXINE SODIUM 25 MCG PO TABS
125.0000 ug | ORAL_TABLET | Freq: Every day | ORAL | Status: DC
  Administered 2015-12-27: 125 ug via ORAL
  Filled 2015-12-26: qty 1

## 2015-12-26 MED ORDER — METFORMIN HCL ER 500 MG PO TB24: 750.0000 mg | ORAL_TABLET | Freq: Every evening | ORAL | Status: DC

## 2015-12-26 MED ORDER — MIDAZOLAM HCL 2 MG/2ML IJ SOLN
INTRAMUSCULAR | Status: AC
  Filled 2015-12-26: qty 2

## 2015-12-26 MED ORDER — PROMETHAZINE HCL 25 MG/ML IJ SOLN: 6.2500 mg | INTRAMUSCULAR | Status: DC | PRN

## 2015-12-26 MED ORDER — HEPARIN SODIUM (PORCINE) 5000 UNIT/ML IJ SOLN
5000.0000 [IU] | Freq: Three times a day (TID) | INTRAMUSCULAR | Status: DC
  Administered 2015-12-27: 5000 [IU] via SUBCUTANEOUS
  Filled 2015-12-26: qty 1

## 2015-12-26 MED ORDER — FENTANYL CITRATE (PF) 100 MCG/2ML IJ SOLN
25.0000 ug | INTRAMUSCULAR | Status: DC | PRN
  Administered 2015-12-26 (×2): 50 ug via INTRAVENOUS

## 2015-12-26 MED ORDER — PROPOFOL 10 MG/ML IV BOLUS
INTRAVENOUS | Status: AC
  Filled 2015-12-26: qty 40

## 2015-12-26 MED ORDER — PHENYLEPHRINE HCL 10 MG/ML IJ SOLN
INTRAMUSCULAR | Status: DC | PRN
  Administered 2015-12-26: 80 ug via INTRAVENOUS

## 2015-12-26 MED ORDER — PRAVASTATIN SODIUM 10 MG PO TABS
20.0000 mg | ORAL_TABLET | Freq: Every day | ORAL | Status: DC
  Administered 2015-12-26: 20 mg via ORAL
  Filled 2015-12-26: qty 2

## 2015-12-26 MED ORDER — PROPOFOL 10 MG/ML IV BOLUS
INTRAVENOUS | Status: DC | PRN
  Administered 2015-12-26: 20 mg via INTRAVENOUS
  Administered 2015-12-26: 200 mg via INTRAVENOUS
  Administered 2015-12-26 (×2): 20 mg via INTRAVENOUS

## 2015-12-26 MED ORDER — POTASSIUM CHLORIDE CRYS ER 20 MEQ PO TBCR
40.0000 meq | EXTENDED_RELEASE_TABLET | Freq: Two times a day (BID) | ORAL | Status: DC
  Filled 2015-12-26 (×2): qty 2

## 2015-12-26 MED ORDER — FENTANYL CITRATE (PF) 100 MCG/2ML IJ SOLN
INTRAMUSCULAR | Status: DC | PRN
  Administered 2015-12-26: 100 ug via INTRAVENOUS
  Administered 2015-12-26 (×2): 50 ug via INTRAVENOUS
  Administered 2015-12-26: 100 ug via INTRAVENOUS

## 2015-12-26 MED ORDER — LOSARTAN POTASSIUM 50 MG PO TABS
100.0000 mg | ORAL_TABLET | Freq: Every day | ORAL | Status: DC
  Administered 2015-12-26: 100 mg via ORAL
  Filled 2015-12-26: qty 2

## 2015-12-26 MED ORDER — METHOCARBAMOL 500 MG PO TABS
500.0000 mg | ORAL_TABLET | Freq: Four times a day (QID) | ORAL | Status: DC | PRN
  Administered 2015-12-26: 500 mg via ORAL
  Filled 2015-12-26: qty 1

## 2015-12-26 MED ORDER — GLYCOPYRROLATE 0.2 MG/ML IJ SOLN
INTRAMUSCULAR | Status: DC | PRN
  Administered 2015-12-26: 0.6 mg via INTRAVENOUS

## 2015-12-26 MED ORDER — ONDANSETRON HCL 4 MG/2ML IJ SOLN
INTRAMUSCULAR | Status: DC | PRN
  Administered 2015-12-26: 4 mg via INTRAVENOUS

## 2015-12-26 MED ORDER — TECHNETIUM TC 99M SULFUR COLLOID FILTERED
0.5000 | Freq: Once | INTRAVENOUS | Status: AC | PRN
  Administered 2015-12-26: 0.5 via INTRADERMAL

## 2015-12-26 MED ORDER — ACETAMINOPHEN 10 MG/ML IV SOLN
INTRAVENOUS | Status: DC | PRN
  Administered 2015-12-26: 1000 mg via INTRAVENOUS

## 2015-12-26 MED ORDER — NEOSTIGMINE METHYLSULFATE 5 MG/5ML IV SOSY
PREFILLED_SYRINGE | INTRAVENOUS | Status: AC
  Filled 2015-12-26: qty 5

## 2015-12-26 MED ORDER — LIDOCAINE 2% (20 MG/ML) 5 ML SYRINGE
INTRAMUSCULAR | Status: AC
  Filled 2015-12-26: qty 5

## 2015-12-26 MED ORDER — FENTANYL CITRATE (PF) 100 MCG/2ML IJ SOLN
INTRAMUSCULAR | Status: AC
  Filled 2015-12-26: qty 4

## 2015-12-26 MED ORDER — TAMOXIFEN CITRATE 10 MG PO TABS
20.0000 mg | ORAL_TABLET | Freq: Every day | ORAL | Status: DC
  Filled 2015-12-26: qty 2

## 2015-12-26 MED ORDER — OXYCODONE-ACETAMINOPHEN 5-325 MG PO TABS: 1.0000 | ORAL_TABLET | ORAL | Status: DC | PRN

## 2015-12-26 MED ORDER — EPHEDRINE SULFATE 50 MG/ML IJ SOLN
INTRAMUSCULAR | Status: DC | PRN
  Administered 2015-12-26: 5 mg via INTRAVENOUS

## 2015-12-26 MED ORDER — ONDANSETRON HCL 4 MG/2ML IJ SOLN
INTRAMUSCULAR | Status: AC
  Filled 2015-12-26: qty 2

## 2015-12-26 MED ORDER — MORPHINE SULFATE (PF) 2 MG/ML IV SOLN
1.0000 mg | INTRAVENOUS | Status: DC | PRN
  Filled 2015-12-26: qty 1

## 2015-12-26 MED ORDER — HYDROMORPHONE HCL 1 MG/ML IJ SOLN
INTRAMUSCULAR | Status: AC
  Filled 2015-12-26: qty 1

## 2015-12-26 MED ORDER — VECURONIUM BROMIDE 10 MG IV SOLR
INTRAVENOUS | Status: DC | PRN
  Administered 2015-12-26: 2 mg via INTRAVENOUS

## 2015-12-26 MED ORDER — CHLORHEXIDINE GLUCONATE CLOTH 2 % EX PADS: 6.0000 | MEDICATED_PAD | Freq: Once | CUTANEOUS | Status: DC

## 2015-12-26 MED ORDER — SODIUM CHLORIDE 0.9 % IJ SOLN
INTRAVENOUS | Status: DC | PRN
  Administered 2015-12-26: 5 mL via INTRAMUSCULAR

## 2015-12-26 MED ORDER — METHYLENE BLUE 0.5 % INJ SOLN
INTRAVENOUS | Status: AC
  Filled 2015-12-26: qty 10

## 2015-12-26 MED ORDER — METHOCARBAMOL 500 MG PO TABS
ORAL_TABLET | ORAL | Status: AC
  Administered 2015-12-26: 500 mg
  Filled 2015-12-26: qty 1

## 2015-12-26 MED ORDER — ROCURONIUM BROMIDE 100 MG/10ML IV SOLN
INTRAVENOUS | Status: DC | PRN
  Administered 2015-12-26: 50 mg via INTRAVENOUS
  Administered 2015-12-26: 20 mg via INTRAVENOUS
  Administered 2015-12-26: 30 mg via INTRAVENOUS

## 2015-12-26 MED ORDER — GLYCOPYRROLATE 0.2 MG/ML IV SOSY
PREFILLED_SYRINGE | INTRAVENOUS | Status: AC
  Filled 2015-12-26: qty 3

## 2015-12-26 MED ORDER — MIDAZOLAM HCL 5 MG/5ML IJ SOLN
INTRAMUSCULAR | Status: DC | PRN
  Administered 2015-12-26 (×2): 1 mg via INTRAVENOUS

## 2015-12-26 MED ORDER — EPHEDRINE SULFATE 50 MG/ML IJ SOLN
INTRAMUSCULAR | Status: AC
  Filled 2015-12-26: qty 1

## 2015-12-26 MED ORDER — HYDROMORPHONE HCL 1 MG/ML IJ SOLN
INTRAMUSCULAR | Status: DC | PRN
  Administered 2015-12-26 (×2): 0.5 mg via INTRAVENOUS
  Administered 2015-12-26: 1 mg via INTRAVENOUS

## 2015-12-26 MED ORDER — LIDOCAINE HCL (CARDIAC) 20 MG/ML IV SOLN
INTRAVENOUS | Status: DC | PRN
  Administered 2015-12-26: 20 mg via INTRAVENOUS
  Administered 2015-12-26: 80 mg via INTRAVENOUS

## 2015-12-26 MED ORDER — ACETAMINOPHEN 325 MG PO TABS
650.0000 mg | ORAL_TABLET | ORAL | Status: DC | PRN
  Administered 2015-12-26 – 2015-12-27 (×4): 650 mg via ORAL
  Filled 2015-12-26 (×5): qty 2

## 2015-12-26 MED ORDER — POTASSIUM CHLORIDE IN NACL 20-0.9 MEQ/L-% IV SOLN
INTRAVENOUS | Status: DC
  Administered 2015-12-26: 14:00:00 via INTRAVENOUS
  Filled 2015-12-26 (×2): qty 1000

## 2015-12-26 MED ORDER — ARTIFICIAL TEARS OP OINT
TOPICAL_OINTMENT | OPHTHALMIC | Status: AC
  Filled 2015-12-26: qty 3.5

## 2015-12-26 MED ORDER — 0.9 % SODIUM CHLORIDE (POUR BTL) OPTIME
TOPICAL | Status: DC | PRN
  Administered 2015-12-26: 1000 mL

## 2015-12-26 MED ORDER — SODIUM CHLORIDE 0.9 % IJ SOLN
INTRAMUSCULAR | Status: AC
  Filled 2015-12-26: qty 10

## 2015-12-26 MED ORDER — TAMOXIFEN CITRATE 20 MG PO TABS: 20.0000 mg | ORAL_TABLET | Freq: Every day | ORAL | Status: DC

## 2015-12-26 MED ORDER — ONDANSETRON 4 MG PO TBDP: 4.0000 mg | ORAL_TABLET | Freq: Four times a day (QID) | ORAL | Status: DC | PRN

## 2015-12-26 MED ORDER — FAMOTIDINE IN NACL 20-0.9 MG/50ML-% IV SOLN
20.0000 mg | Freq: Two times a day (BID) | INTRAVENOUS | Status: DC
  Administered 2015-12-26: 20 mg via INTRAVENOUS
  Filled 2015-12-26 (×4): qty 50

## 2015-12-26 MED ORDER — SODIUM CHLORIDE 0.9 % IJ SOLN
INTRAMUSCULAR | Status: AC
  Filled 2015-12-26: qty 30

## 2015-12-26 MED ORDER — NEOSTIGMINE METHYLSULFATE 10 MG/10ML IV SOLN
INTRAVENOUS | Status: DC | PRN
  Administered 2015-12-26: 4 mg via INTRAVENOUS

## 2015-12-26 MED ORDER — ONDANSETRON HCL 4 MG/2ML IJ SOLN
4.0000 mg | Freq: Four times a day (QID) | INTRAMUSCULAR | Status: DC | PRN
  Administered 2015-12-26 – 2015-12-27 (×3): 4 mg via INTRAVENOUS
  Filled 2015-12-26 (×3): qty 2

## 2015-12-26 MED ORDER — PHENYLEPHRINE 40 MCG/ML (10ML) SYRINGE FOR IV PUSH (FOR BLOOD PRESSURE SUPPORT)
PREFILLED_SYRINGE | INTRAVENOUS | Status: AC
  Filled 2015-12-26: qty 10

## 2015-12-26 MED ORDER — ARTIFICIAL TEARS OP OINT
TOPICAL_OINTMENT | OPHTHALMIC | Status: DC | PRN
  Administered 2015-12-26: 1 via OPHTHALMIC

## 2015-12-26 MED ORDER — ROCURONIUM BROMIDE 10 MG/ML (PF) SYRINGE
PREFILLED_SYRINGE | INTRAVENOUS | Status: AC
  Filled 2015-12-26: qty 20

## 2015-12-26 MED ORDER — HYDROCHLOROTHIAZIDE 12.5 MG PO CAPS
12.5000 mg | ORAL_CAPSULE | Freq: Every day | ORAL | Status: DC
  Administered 2015-12-26: 12.5 mg via ORAL
  Filled 2015-12-26: qty 1

## 2015-12-26 MED ORDER — LACTATED RINGERS IV SOLN
INTRAVENOUS | Status: DC | PRN
  Administered 2015-12-26 (×2): via INTRAVENOUS

## 2015-12-26 SURGICAL SUPPLY — 56 items
ADH SKN CLS APL DERMABOND .7 (GAUZE/BANDAGES/DRESSINGS) ×1
APPLIER CLIP 9.375 MED OPEN (MISCELLANEOUS) ×2
APR CLP MED 9.3 20 MLT OPN (MISCELLANEOUS) ×1
BINDER BREAST LRG (GAUZE/BANDAGES/DRESSINGS) IMPLANT
BINDER BREAST XLRG (GAUZE/BANDAGES/DRESSINGS) IMPLANT
BINDER BREAST XXLRG (GAUZE/BANDAGES/DRESSINGS) ×1 IMPLANT
BIOPATCH RED 1 DISK 7.0 (GAUZE/BANDAGES/DRESSINGS) ×5 IMPLANT
CANISTER SUCTION 2500CC (MISCELLANEOUS) ×2 IMPLANT
CHLORAPREP W/TINT 26ML (MISCELLANEOUS) ×2 IMPLANT
CLIP APPLIE 9.375 MED OPEN (MISCELLANEOUS) ×1 IMPLANT
CONT SPEC 4OZ CLIKSEAL STRL BL (MISCELLANEOUS) ×4 IMPLANT
COVER PROBE W GEL 5X96 (DRAPES) ×2 IMPLANT
COVER SURGICAL LIGHT HANDLE (MISCELLANEOUS) ×2 IMPLANT
DERMABOND ADVANCED (GAUZE/BANDAGES/DRESSINGS) ×1
DERMABOND ADVANCED .7 DNX12 (GAUZE/BANDAGES/DRESSINGS) IMPLANT
DEVICE DISSECT PLASMABLAD 3.0S (MISCELLANEOUS) IMPLANT
DRAIN CHANNEL 19F RND (DRAIN) ×5 IMPLANT
DRAPE CHEST BREAST 15X10 FENES (DRAPES) ×2 IMPLANT
DRSG PAD ABDOMINAL 8X10 ST (GAUZE/BANDAGES/DRESSINGS) ×5 IMPLANT
DRSG TEGADERM 4X4.75 (GAUZE/BANDAGES/DRESSINGS) ×3 IMPLANT
ELECT CAUTERY BLADE 6.4 (BLADE) ×2 IMPLANT
ELECT REM PT RETURN 9FT ADLT (ELECTROSURGICAL) ×2
ELECTRODE REM PT RTRN 9FT ADLT (ELECTROSURGICAL) ×1 IMPLANT
EVACUATOR SILICONE 100CC (DRAIN) ×5 IMPLANT
GLOVE BIO SURGEON STRL SZ7.5 (GLOVE) ×2 IMPLANT
GOWN STRL REUS W/ TWL LRG LVL3 (GOWN DISPOSABLE) ×2 IMPLANT
GOWN STRL REUS W/TWL LRG LVL3 (GOWN DISPOSABLE) ×4
KIT BASIN OR (CUSTOM PROCEDURE TRAY) ×2 IMPLANT
KIT ROOM TURNOVER OR (KITS) ×2 IMPLANT
LIQUID BAND (GAUZE/BANDAGES/DRESSINGS) ×1 IMPLANT
NDL 18GX1X1/2 (RX/OR ONLY) (NEEDLE) IMPLANT
NDL FILTER BLUNT 18X1 1/2 (NEEDLE) IMPLANT
NDL HYPO 25GX1X1/2 BEV (NEEDLE) IMPLANT
NEEDLE 18GX1X1/2 (RX/OR ONLY) (NEEDLE) IMPLANT
NEEDLE FILTER BLUNT 18X 1/2SAF (NEEDLE)
NEEDLE FILTER BLUNT 18X1 1/2 (NEEDLE) IMPLANT
NEEDLE HYPO 25GX1X1/2 BEV (NEEDLE) IMPLANT
NS IRRIG 1000ML POUR BTL (IV SOLUTION) ×2 IMPLANT
PACK GENERAL/GYN (CUSTOM PROCEDURE TRAY) ×2 IMPLANT
PAD ARMBOARD 7.5X6 YLW CONV (MISCELLANEOUS) ×2 IMPLANT
PLASMABLADE 3.0S (MISCELLANEOUS)
SPECIMEN JAR MEDIUM (MISCELLANEOUS) ×1 IMPLANT
SPECIMEN JAR X LARGE (MISCELLANEOUS) ×3 IMPLANT
SPONGE GAUZE 4X4 12PLY STER LF (GAUZE/BANDAGES/DRESSINGS) ×1 IMPLANT
SPONGE LAP 18X18 X RAY DECT (DISPOSABLE) ×1 IMPLANT
STAPLER VISISTAT 35W (STAPLE) ×1 IMPLANT
SUT ETHILON 3 0 FSL (SUTURE) ×2 IMPLANT
SUT MNCRL AB 4-0 PS2 18 (SUTURE) ×5 IMPLANT
SUT VIC AB 3-0 54X BRD REEL (SUTURE) IMPLANT
SUT VIC AB 3-0 BRD 54 (SUTURE)
SUT VIC AB 3-0 SH 18 (SUTURE) ×3 IMPLANT
SYR CONTROL 10ML LL (SYRINGE) IMPLANT
TAPE CLOTH SURG 4X10 WHT LF (GAUZE/BANDAGES/DRESSINGS) ×1 IMPLANT
TOWEL OR 17X24 6PK STRL BLUE (TOWEL DISPOSABLE) ×2 IMPLANT
TOWEL OR 17X26 10 PK STRL BLUE (TOWEL DISPOSABLE) ×2 IMPLANT
TUBE CONNECTING 12X1/4 (SUCTIONS) IMPLANT

## 2015-12-26 NOTE — Interval H&P Note (Signed)
History and Physical Interval Note:  12/26/2015 7:11 AM  Debbie [name]  has presented today for surgery, with the diagnosis of BILATERAL BREAST CANCER  The various methods of treatment have been discussed with the patient and family. After consideration of risks, benefits and other options for treatment, the patient has consented to  Procedure(s): BILATERAL MASTECTOMY WITH LEFT SENTINEL LYMPH NODE BIOPSY (Bilateral) as a surgical intervention .  The patient's history has been reviewed, patient examined, no change in status, stable for surgery.  I have reviewed the patient's chart and labs.  Questions were answered to the patient's satisfaction.     TOTH III,Starlit Raburn S

## 2015-12-26 NOTE — Anesthesia Procedure Notes (Signed)
Procedure Name: Intubation Date/Time: 12/26/2015 7:50 AM Performed by: Scheryl Darter Pre-anesthesia Checklist: Patient identified, Emergency Drugs available, Suction available and Patient being monitored Patient Re-evaluated:Patient Re-evaluated prior to inductionOxygen Delivery Method: Circle System Utilized Preoxygenation: Pre-oxygenation with 100% oxygen Intubation Type: IV induction Ventilation: Mask ventilation without difficulty Grade View: Grade I Tube type: Oral Tube size: 7.5 mm Number of attempts: 1 Airway Equipment and Method: Stylet and Oral airway Placement Confirmation: ETT inserted through vocal cords under direct vision,  positive ETCO2 and breath sounds checked- equal and bilateral Secured at: 22 cm Tube secured with: Tape Dental Injury: Teeth and Oropharynx as per pre-operative assessment

## 2015-12-26 NOTE — Progress Notes (Signed)
Complaining of dizziness /lighteadednessafter using the bathroom.VSS. Bp 124/60, HR 93,O2 sat 100 % on oxygen at 2 LPM. Will endorse.

## 2015-12-26 NOTE — Anesthesia Postprocedure Evaluation (Signed)
Anesthesia Post Note  Patient: Debbie [name]  Procedure(s) Performed: Procedure(s) (LRB): BILATERAL MASTECTOMY WITH LEFT SENTINEL LYMPH NODE BIOPSY (Bilateral)  Patient location during evaluation: PACU Anesthesia Type: General Level of consciousness: awake and alert Pain management: pain level controlled Vital Signs Assessment: post-procedure vital signs reviewed and stable Respiratory status: spontaneous breathing, nonlabored ventilation and respiratory function stable Cardiovascular status: blood pressure returned to baseline and stable Postop Assessment: no signs of nausea or vomiting Anesthetic complications: no    Last Vitals:  Vitals:   12/26/15 1200 12/26/15 1215  BP: (!) 178/63 (!) 138/56  Pulse:    Resp:    Temp:      Last Pain:  Vitals:   12/26/15 1215  TempSrc:   PainSc: Asleep                 Nilda Simmer

## 2015-12-26 NOTE — Transfer of Care (Signed)
Immediate Anesthesia Transfer of Care Note  Patient: Debbie [name]  Procedure(s) Performed: Procedure(s): BILATERAL MASTECTOMY WITH LEFT SENTINEL LYMPH NODE BIOPSY (Bilateral)  Patient Location: PACU  Anesthesia Type:General  Level of Consciousness: awake, alert , oriented and sedated  Airway & Oxygen Therapy: Patient Spontanous Breathing and Patient connected to nasal cannula oxygen  Post-op Assessment: Post -op Vital signs reviewed and stable and Patient moving all extremities  Post vital signs: Reviewed and stable  Last Vitals:  Vitals:   12/26/15 0600 12/26/15 1115  BP: (!) 142/78   Pulse: 91   Resp: 18   Temp: 37.3 C (P) 36.9 C    Last Pain:  Vitals:   12/26/15 0600  TempSrc: Oral         Complications: No apparent anesthesia complications

## 2015-12-26 NOTE — Op Note (Signed)
12/26/2015  10:59 AM  PATIENT:  Debbie [name]  50 y.o. female  PRE-OPERATIVE DIAGNOSIS:  BILATERAL BREAST CANCER  POST-OPERATIVE DIAGNOSIS:  BILATERAL BREAST CANCER  PROCEDURE:  Procedure(s): BILATERAL MASTECTOMY WITH LEFT SENTINEL LYMPH NODE BIOPSY  And injection blue dye  SURGEON:  Surgeon(s) and Role:    * Jovita Kussmaul, MD - Primary  PHYSICIAN ASSISTANT:   ASSISTANTS: Judyann Munson, RNFA   ANESTHESIA:   general  EBL:  Total I/O In: 1500 [I.V.:1500] Out: 400 [Urine:250; Blood:150]  BLOOD ADMINISTERED:none  DRAINS: (4) Jackson-Pratt drain(s) with closed bulb suction in the prepectoral space   LOCAL MEDICATIONS USED:  NONE  SPECIMEN:  Source of Specimen:  right mastectomy and left mastectomy with sentinel nodes X 3  DISPOSITION OF SPECIMEN:  PATHOLOGY  COUNTS:  YES  TOURNIQUET:  * No tourniquets in log *  DICTATION: .Dragon Dictation   After informed consent was obtained the patient was brought to the operating room and placed in the supine position on the operating room table. After adequate induction of general anesthesia the patient's bilateral chest, breast, and axillary areas were prepped with Betadine and draped in usual sterile manner. An appropriate timeout was performed. Earlier in the day the patient underwent injection of 1 mCi of technetium sulfur colloid in the upper outer subareolar area of the left breast. The neoprobe was initially set to technetium in the left axilla was examined. No significant radioactivity was identified. At this point, 2 mL of methylene blue and 3 mL of injectable saline were injected just outside the previous reduction incisions in the upper and outer periareolar area. Attention was then first turned to the right breast. An elliptical incision was made around the nipple and areola complex in order to minimize the excess skin and excised the small area of necrotic skin at the lower portion of the breast.. This incision was  carried through the skin and subcutaneous tissue sharply with the plasma blade. Breast hooks were then used to elevate the skin flaps anteriorly towards the ceiling. Thin skin flaps were then created circumferentially between the breast tissue in the subcutaneous fat.  This dissection was carried all the way to the chest wall. Once this was accomplished the breast was then removed from the pectoralis muscle with the pectoralis fashion in a top-down fashion. Once the breast was removed it was oriented with a stitch on the lateral skin and sent to pathology for further evaluation. Hemostasis was achieved using the plasma blade. The wound was irrigated with copious amounts of saline. The skin flaps appeared healthy.  2 small stab incisions were made near the anterior axillary line inferior to the operative area with a 15 blade knife.  A tonsil clamp was placed through each of these openings and used to bring a 19 [place] round Blake drain into the operative bed. The lateral drain was placed in the axilla and the medial drain was curled along the chest wall. The drains were anchored to the skin with a 3-0 nylon stitch. Next the superior and inferior skin flaps were grossly reapproximated with interrupted 3-0 Vicryl stitches. The skin was then closed with a running 4-0 Monocryl subcuticular stitch. The drains were placed to bulb suction and there was a good seal. Again the skin flaps were healthy and viable-appearing. Attention was then turned to the left breast. A similar elliptical incision was made around the nipple and areola complex in order to minimize the excess skin and excised the necrotic skin on the  lower portion of the breast. The incision was then carried through the skin and subcutaneous tissue sharply with the plasma blade. Breast hooks were used to elevate the skin flaps anteriorly towards the ceiling. Thin skin flaps were created circumferentially between the breast tissue in the subcutaneous fat. This  dissection was carried all the way to the chest wall. Laterally a cloudy seroma pocket was encountered and we made every attempt to excise the seroma wall but there was one small space where the seroma was adjacent to the skin. Once the dissection reached the chest wall circumferentially then the breast was removed from the pectoralis muscle with the pectoralis fascia. This was also done sharply with the plasma blade and a top-down fashion. Once the dissection reached the axilla we were able to identify a lymph node with the neoprobe with increased radioactivity. This lymph node was excised sharply with the plasma blade.  One other lymph node was identified that had blue dye and it was also excised sharply with the plasma blade. A third lymph node was identified that was palpable and also excised sharply with the plasma blade. These were sent as sentinel nodes numbers 1 through 3. Hemostasis was achieved using the plasma blade. The wound was irrigated with copious amounts of saline. 2 small stab incisions were made near the anterior axillary line with a 15 blade knife. A tonsil clamp was placed through each of these openings and used to bring the 19 [place] round Blake drains into the operative bed. Again the lateral drain was placed in the axilla and the medial drain was curled along the chest wall.  The drains were anchored to the skin with a 3-0 nylon stitch. The superior and inferior skin flaps were then grossly reapproximated with interrupted 3-0 Vicryl stitches. The skin flaps appeared to be healthy and viable.  The skin incision was then closed with a running 4-0 Monocryl subcuticular stitch. The drains were placed to bulb suction and there was a good seal. Dermabond dressings were then applied. The patient tolerated the procedure well. At the end of the case all needle sponge and instrument counts were correct. The patient was then awakened and taken to recovery in stable condition.  PLAN OF CARE: Admit for  overnight observation  PATIENT DISPOSITION:  PACU - hemodynamically stable.   Delay start of Pharmacological VTE agent (>24hrs) due to surgical blood loss or risk of bleeding: no

## 2015-12-26 NOTE — H&P (Signed)
Debbie [name]. Saran  Location: [facility] Patient #: [id number] DOB: 1965-08-14 Undefined / Language: Debbie [name] / Race: White Female   History of Present Illness  The patient is a 50 year old female who presents with breast cancer. We are asked to see the patient in consultation by Dr. Isidore [name] to evaluate her for a new right breast cancer. The patient is a 50 year old black female who presents after a routine screening mammogram showed an area of abnormal calcification in the right breast measuring about 5cm. This was biopsied and came back as grade 2 invasive ductal cancer. She was ER and PR positive and Her2 negative with a Ki67 of 70%. She occasionally has breast tenderness but no nipple discharge. She does take hormone replacement which she has stopped but also has a Mirena in. She is very large breasted and has been interested in breast reduction.   Medication History Medications Reconciled    Review of Systems  General Not Present- Appetite Loss, Chills, Fatigue, Fever, Night Sweats, Weight Gain and Weight Loss. Skin Not Present- Change in Wart/Mole, Dryness, Hives, Jaundice, New Lesions, Non-Healing Wounds, Rash and Ulcer. HEENT Not Present- Earache, Hearing Loss, Hoarseness, Nose Bleed, Oral Ulcers, Ringing in the Ears, Seasonal Allergies, Sinus Pain, Sore Throat, Visual Disturbances, Wears glasses/contact lenses and Yellow Eyes. Respiratory Not Present- Bloody sputum, Chronic Cough, Difficulty Breathing, Snoring and Wheezing. Breast Not Present- Breast Mass, Breast Pain, Nipple Discharge and Skin Changes. Cardiovascular Not Present- Chest Pain, Difficulty Breathing Lying Down, Leg Cramps, Palpitations, Rapid Heart Rate, Shortness of Breath and Swelling of Extremities. Gastrointestinal Not Present- Abdominal Pain, Bloating, Bloody Stool, Change in Bowel Habits, Chronic diarrhea, Constipation, Difficulty Swallowing, Excessive gas, Gets full quickly at meals, Hemorrhoids,  Indigestion, Nausea, Rectal Pain and Vomiting. Female Genitourinary Not Present- Frequency, Nocturia, Painful Urination, Pelvic Pain and Urgency. Musculoskeletal Not Present- Back Pain, Joint Pain, Joint Stiffness, Muscle Pain, Muscle Weakness and Swelling of Extremities. Neurological Not Present- Decreased Memory, Fainting, Headaches, Numbness, Seizures, Tingling, Tremor, Trouble walking and Weakness. Psychiatric Not Present- Anxiety, Bipolar, Change in Sleep Pattern, Depression, Fearful and Frequent crying. Endocrine Not Present- Cold Intolerance, Excessive Hunger, Hair Changes, Heat Intolerance, Hot flashes and New Diabetes. Hematology Not Present- Easy Bruising, Excessive bleeding, Gland problems, HIV and Persistent Infections.   Physical Exam  General Mental Status-Alert. General Appearance-Consistent with stated age. Hydration-Well hydrated. Voice-Normal.  Head and Neck Head-normocephalic, atraumatic with no lesions or palpable masses. Trachea-midline. Thyroid Gland Characteristics - normal size and consistency.  Eye Eyeball - Bilateral-Extraocular movements intact. Sclera/Conjunctiva - Bilateral-No scleral icterus.  Chest and Lung Exam Chest and lung exam reveals -quiet, even and easy respiratory effort with no use of accessory muscles and on auscultation, normal breath sounds, no adventitious sounds and normal vocal resonance. Inspection Chest Wall - Normal. Back - normal.  Breast Note: There is no palpable mass in either breast. There is no palpable axillary, supraclavicular, or cervical lymphadenopathy   Cardiovascular Cardiovascular examination reveals -normal heart sounds, regular rate and rhythm with no murmurs and normal pedal pulses bilaterally.  Abdomen Inspection Inspection of the abdomen reveals - No Hernias. Skin - Scar - no surgical scars. Palpation/Percussion Palpation and Percussion of the abdomen reveal - Soft, Non Tender, No  Rebound tenderness, No Rigidity (guarding) and No hepatosplenomegaly. Auscultation Auscultation of the abdomen reveals - Bowel sounds normal.  Neurologic Neurologic evaluation reveals -alert and oriented x 3 with no impairment of recent or remote memory. Mental Status-Normal.  Musculoskeletal Normal Exam - Left-Upper  Extremity Strength Normal and Lower Extremity Strength Normal. Normal Exam - Right-Upper Extremity Strength Normal and Lower Extremity Strength Normal.  Lymphatic Head & Neck  General Head & Neck Lymphatics: Bilateral - Description - Normal. Axillary  General Axillary Region: Bilateral - Description - Normal. Tenderness - Non Tender. Femoral & Inguinal  Generalized Femoral & Inguinal Lymphatics: Bilateral - Description - Normal. Tenderness - Non Tender.    Assessment & Plan  BREAST CANCER OF UPPER-OUTER QUADRANT OF RIGHT FEMALE BREAST (C50.411) Impression: The patient appears to have a stage 2A cancer in the upper outer right breast. I have talked to her in detail about the options for treatment and at this point she favors breast conservation. She would be very interested in a reduction lumpectomy if this is possible. She would also need a sentinel node mapping as well. We will refer her to Plastic Surgery to discuss the options and then proceed. I have discussed with her the risks and benefits of the surgery as well as some of the technical aspects and she understands and wishes to proceed. Current Plans Pt Education - Breast Cancer: discussed with patient and provided information.  She now has bilateral breast cancer and has elected for bilateral mastectomy and left sentinel node mapping

## 2015-12-27 ENCOUNTER — Encounter (HOSPITAL_COMMUNITY): Payer: Self-pay | Admitting: General Surgery

## 2015-12-27 DIAGNOSIS — C50912 Malignant neoplasm of unspecified site of left female breast: Secondary | ICD-10-CM | POA: Diagnosis not present

## 2015-12-27 DIAGNOSIS — Z170 Estrogen receptor positive status [ER+]: Secondary | ICD-10-CM | POA: Diagnosis not present

## 2015-12-27 DIAGNOSIS — E785 Hyperlipidemia, unspecified: Secondary | ICD-10-CM | POA: Diagnosis not present

## 2015-12-27 DIAGNOSIS — C50411 Malignant neoplasm of upper-outer quadrant of right female breast: Secondary | ICD-10-CM | POA: Diagnosis not present

## 2015-12-27 DIAGNOSIS — D0582 Other specified type of carcinoma in situ of left breast: Secondary | ICD-10-CM | POA: Diagnosis not present

## 2015-12-27 DIAGNOSIS — I10 Essential (primary) hypertension: Secondary | ICD-10-CM | POA: Diagnosis not present

## 2015-12-27 DIAGNOSIS — L7634 Postprocedural seroma of skin and subcutaneous tissue following other procedure: Secondary | ICD-10-CM | POA: Diagnosis not present

## 2015-12-27 DIAGNOSIS — E669 Obesity, unspecified: Secondary | ICD-10-CM | POA: Diagnosis not present

## 2015-12-27 DIAGNOSIS — E039 Hypothyroidism, unspecified: Secondary | ICD-10-CM | POA: Diagnosis not present

## 2015-12-27 NOTE — Progress Notes (Signed)
1 Day Post-Op  Subjective: No complaints.  Objective: Vital signs in last 24 hours: Temp:  [98.1 F (36.7 C)-98.4 F (36.9 C)] 98.4 F (36.9 C) (08/18 0644) Pulse Rate:  [84-96] 91 (08/18 0644) Resp:  [16-19] 19 (08/18 0644) BP: (133-178)/(44-82) 145/77 (08/18 0644) SpO2:  [99 %] 99 % (08/18 0644) Last BM Date: 12/26/15  Intake/Output from previous day: 08/17 0701 - 08/18 0700 In: 1916.7 [I.V.:1711.7; IV Piggyback:50] Out: 1975 [Urine:1450; Drains:375; Blood:150] Intake/Output this shift: No intake/output data recorded.  Resp: clear to auscultation bilaterally Chest wall: skin flaps look good. drain output serosanguinous Cardio: regular rate and rhythm GI: soft, non-tender; bowel sounds normal; no masses,  no organomegaly  Lab Results:  No results for input(s): WBC, HGB, HCT, PLT in the last 72 hours. BMET No results for input(s): NA, K, CL, CO2, GLUCOSE, BUN, CREATININE, CALCIUM in the last 72 hours. PT/INR No results for input(s): LABPROT, INR in the last 72 hours. ABG No results for input(s): PHART, HCO3 in the last 72 hours.  Invalid input(s): PCO2, PO2  Studies/Results: Nm Sentinel Node Inj-no Rpt (breast)  Result Date: 12/26/2015 CLINICAL DATA: left breast cancer Sulfur colloid was injected intradermally by the nuclear medicine technologist for breast cancer sentinel node localization.    Anti-infectives: Anti-infectives    Start     Dose/Rate Route Frequency Ordered Stop   12/26/15 0700  ceFAZolin (ANCEF) IVPB 2g/100 mL premix     2 g 200 mL/hr over 30 Minutes Intravenous To [facility] 12/25/15 1350 12/26/15 0802      Assessment/Plan: s/p Procedure(s): BILATERAL MASTECTOMY WITH LEFT SENTINEL LYMPH NODE BIOPSY (Bilateral) Advance diet Discharge  Drain teaching  LOS: 0 days    TOTH III,Sinclair Alligood S 12/27/2015

## 2015-12-27 NOTE — Discharge Summary (Signed)
Physician Discharge Summary  Patient ID: Debbie [name] MRN: [id number] DOB/AGE: [birth year] 52 y.o.  Admit date: 12/26/2015 Discharge date: 12/27/2015  Admission Diagnoses:  Discharge Diagnoses:  Active Problems:   Bilateral breast cancer [facility])   Discharged Condition: good  Hospital Course: the patient underwent bilateral mastectomies and left sentinel lymph node mapping for bilateral breast cancer. She tolerated surgery well. On pod 1 she was ready for discharge home  Consults: None  Significant Diagnostic Studies: none  Treatments: surgery: as above  Discharge Exam: Blood pressure (!) 145/77, pulse 91, temperature 98.4 F (36.9 C), temperature source Oral, resp. rate 19, height 5' 6.5" (1.689 m), weight 111 kg (244 lb 11 oz), last menstrual period 12/19/2015, SpO2 99 %. Resp: clear to auscultation bilaterally Chest wall: skin flaps look good Cardio: regular rate and rhythm GI: soft, non-tender; bowel sounds normal; no masses,  no organomegaly  Disposition: 01-Home or Self Care  Discharge Instructions    Call MD for:  difficulty breathing, headache or visual disturbances    Complete by:  As directed   Call MD for:  extreme fatigue    Complete by:  As directed   Call MD for:  hives    Complete by:  As directed   Call MD for:  persistant dizziness or light-headedness    Complete by:  As directed   Call MD for:  persistant nausea and vomiting    Complete by:  As directed   Call MD for:  redness, tenderness, or signs of infection (pain, swelling, redness, odor or green/yellow discharge around incision site)    Complete by:  As directed   Call MD for:  severe uncontrolled pain    Complete by:  As directed   Call MD for:  temperature >100.4    Complete by:  As directed   Diet - low sodium heart healthy    Complete by:  As directed   Discharge instructions    Complete by:  As directed   Sponge bathe while drains are in. No overhead activity. Empty drains, record output, and  recharge bulb twice a day   Increase activity slowly    Complete by:  As directed       Medication List    TAKE these medications   acetaminophen 325 MG tablet Commonly known as:  TYLENOL Take 650 mg by mouth every 6 (six) hours as needed for mild pain. Reported on 10/23/2015   hydrochlorothiazide 12.5 MG capsule Commonly known as:  MICROZIDE Take 12.5 mg by mouth daily.   levonorgestrel 20 MCG/24HR IUD Commonly known as:  MIRENA 1 each by Intrauterine route once.   levothyroxine 125 MCG tablet Commonly known as:  SYNTHROID, LEVOTHROID Take 125 mcg by mouth daily.   LIVALO 2 MG Tabs Generic drug:  Pitavastatin Calcium Take 0.2 mg by mouth daily.   losartan 100 MG tablet Commonly known as:  COZAAR Take 100 mg by mouth daily.   metFORMIN 750 MG 24 hr tablet Commonly known as:  GLUCOPHAGE-XR Take 750 mg by mouth every evening.   methocarbamol 500 MG tablet Commonly known as:  ROBAXIN Take 1 tablet (500 mg total) by mouth every 6 (six) hours as needed for muscle spasms.   oxyCODONE-acetaminophen 5-325 MG tablet Commonly known as:  ROXICET Take 1-2 tablets by mouth every 4 (four) hours as needed.   potassium chloride SA 20 MEQ tablet Commonly known as:  K-DUR,KLOR-CON Take 40 mEq by mouth 2 (two) times daily.   tamoxifen 20 MG  tablet Commonly known as:  NOLVADEX Take 20 mg by mouth daily.   ZYRTEC ALLERGY PO Take 1 tablet by mouth daily as needed (allergies).        SignedJovita Kussmaul S 12/27/2015, 8:23 AM

## 2016-01-03 MED FILL — LEVOTHYROXINE 125 MCG TAB: 125 | 30 days supply | Qty: 30 | Fill #11

## 2016-01-03 MED FILL — LOSARTAN POTASSIUM 100 MG T: 100 | 90 days supply | Qty: 90 | Fill #2

## 2016-01-03 MED FILL — LIVALO 2 MG TABLET: 2 | 30 days supply | Qty: 30 | Fill #1

## 2016-01-07 ENCOUNTER — Encounter: Payer: Self-pay | Admitting: *Deleted

## 2016-01-07 NOTE — Progress Notes (Signed)
[facility] Psychosocial Distress Screening Clinical Social Work  Clinical Social Work was referred by distress screening protocol.  The patient scored a 5 on the Psychosocial Distress Thermometer which indicates moderate distress. Clinical Social Worker contacted patient at home to assess for distress and other psychosocial needs.  Patient stated she was doing "good" and had no concerns at this time.  CSW provided information on the support team and support services at [facility].  CSW encouraged patient to call with needs or concerns.    ONCBCN DISTRESS SCREENING 12/18/2015  Screening Type Initial Screening  Distress experienced in past week (1-10) 5  Emotional problem type Nervousness/Anxiety  Physician notified of physical symptoms Yes  Referral to support programs   Other To have another surgery on 12/26/15.  Will assess need for distress when she returns post surgery    Johnnye Lana, MSW, LCSW, OSW-C Clinical Social Worker [facility] [phone]

## 2016-01-09 ENCOUNTER — Ambulatory Visit
Admission: RE | Admit: 2016-01-09 | Discharge: 2016-01-09 | Disposition: A | Discharge status: Home / Self Care | Payer: 59 | Source: Ambulatory Visit | Attending: Oncology | Admitting: Oncology

## 2016-01-09 DIAGNOSIS — Z13820 Encounter for screening for osteoporosis: Secondary | ICD-10-CM | POA: Diagnosis not present

## 2016-01-09 DIAGNOSIS — C50211 Malignant neoplasm of upper-inner quadrant of right female breast: Secondary | ICD-10-CM

## 2016-01-09 DIAGNOSIS — Z780 Asymptomatic menopausal state: Secondary | ICD-10-CM | POA: Diagnosis not present

## 2016-01-09 DIAGNOSIS — M8580 Other specified disorders of bone density and structure, unspecified site: Secondary | ICD-10-CM

## 2016-01-14 DIAGNOSIS — C50911 Malignant neoplasm of unspecified site of right female breast: Secondary | ICD-10-CM | POA: Diagnosis not present

## 2016-01-14 DIAGNOSIS — C50912 Malignant neoplasm of unspecified site of left female breast: Secondary | ICD-10-CM | POA: Diagnosis not present

## 2016-01-17 MED FILL — METFORMIN HCL ER 750 MG TAB: 750 | 30 days supply | Qty: 30 | Fill #4

## 2016-01-17 MED FILL — TAMOXIFEN 20 MG TABLET: 20 | 90 days supply | Qty: 90 | Fill #1

## 2016-01-21 DIAGNOSIS — E890 Postprocedural hypothyroidism: Secondary | ICD-10-CM | POA: Diagnosis not present

## 2016-01-21 DIAGNOSIS — H5213 Myopia, bilateral: Secondary | ICD-10-CM | POA: Diagnosis not present

## 2016-01-21 NOTE — Addendum Note (Signed)
Encounter addended by: Ernst Spell, RN on: 01/21/2016  3:59 PM<BR>    Actions taken: Charge Capture section accepted

## 2016-01-28 NOTE — Progress Notes (Signed)
Location of Breast Cancer: Left Breast, Right Breast  Histology per Pathology Report:  12/26/15 Diagnosis 1. Breast, simple mastectomy, Right - BIOPSY CAVITY ASSOCIATED WITH INFLAMMATION AND FIBROSIS. - NO RESIDUAL CARCINOMA. - FINAL MARGINS CLEAR. - THREE BENIGN LYMPH NODES (0/3). 2. Breast, simple mastectomy, Left - FOCAL DUCTAL CARCINOMA IN SITU, 0.2 CM. - LOBULAR CARCINOMA IN SITU. - MARGINS NOT INVOLVED. - FIBROCYSTIC CHANGES. - BIOPSY CAVITY ASSOCIATED WITH FIBROSIS AND INFLAMMATION. 3. Lymph node, sentinel, biopsy, Left axillary #1 - ONE BENIGN LYMPH NODE (0/1). 4. Breast, excision, Right additional inferior and medial margin - BENIGN ADIPOSE TISSUE WITH FOCAL HISTIOCYTIC INFILTRATE CONSISTENT WITH BIOPSY REACTION. - NO EVIDENCE OF MALIGNANCY. - FINAL MARGIN CLEAR. 5. Lymph node, sentinel, biopsy, Left axillary #2 - ONE BENIGN LYMPH NODE (0/1). 6. Lymph node, sentinel, biopsy, Left axillary #3 - ONE BENIGN LYMPH NODE (0/1). 7. Lymph node, sentinel, biopsy - ONE  11/28/15 Diagnosis 1. Breast, Mammoplasty, left - INVASIVE DUCTAL CARCINOMA, GRADE I/III, SPANNING AT LEAST 1.2 CM. - DUCTAL CARCINOMA IN SITU WITH CALCIFICATIONS, HIGH GRADE. - LOBULAR NEOPLASIA (LOBULAR CARCINOMA IN SITU). - SEE ONCOLOGY TABLE BELOW. 2. Lymph node, sentinel, biopsy, Right axillary #1 - METASTATIC CARCINOMA IN 1 OF 1 LYMPH NODE (1/1), WITH EXTRACAPSULAR EXTENSION. 3. Breast, lumpectomy, Right - INVASIVE DUCTAL CARCINOMA, GRADE I/III, SPANNING 2.3 CM. - DUCTAL CARCINOMA IN SITU WITH CALCIFICATIONS, INTERMEDIATE GRADE - LYMPHOVASCULAR INVASION IS IDENTIFIED. - INVASIVE CARCINOMA IS FOCALLY 0.1 CM TO THE MEDIAL MARGIN OF SPECIMEN #3. - SEE ONCOLOGY TABLE BELOW. 4. Breast, excision, Right additional medial margin - BENIGN BREAST PARENCHYMA. - THERE IS NO EVIDENCE OF MALIGNANCY. - SEE COMMENT. 5. Breast, excision, Right additional anterior margin - INVASIVE DUCTAL CARCINOMA. - THE SURGICAL  RESECTION MARGIN IS NEGATIVE FOR CARCINOMA. 6. Breast, Mammoplasty, Right breast tissue - LOBULAR NEOPLASIA (ATYPICAL LOBULAR HYPERPLASIA).  Receptor Status: ER(80%), PR (60%), Her2-neu (NEG), Ki-(3%)  Did patient present with symptoms or was this found on screening mammography?: It was found on a screening mammogram.   Past/Anticipated interventions by surgeon, if any: 12/26/15 PROCEDURE:  Procedure(s): BILATERAL MASTECTOMY WITH LEFT SENTINEL LYMPH NODE BIOPSY  And injection blue dye  SURGEON:  Surgeon(s) and Role:    * Autumn Messing III, MD - Primary  Past/Anticipated interventions by medical oncology, if any:  Started on tamoxifen neoadjuvantly 10/23/2015   Lymphedema issues, if any:  She denies. She saw PT for one visit. She does notice swelling when her arms are dependent, but once elevated they improve. She has good arm movement bilaterally.   Pain issues, if any:  She denies   SAFETY ISSUES:  Prior radiation? She had "the pill" to shrink her Thyroid in 1996.  Pacemaker/ICD? No  Possible current pregnancy? No  Is the patient on methotrexate? No  Current Complaints / other details:    BP (!) 158/74   Pulse 83   Temp 98.3 F (36.8 C)   Ht 5' 6.5" (1.689 m)   Wt 241 lb 9.6 oz (109.6 kg)   SpO2 100% Comment: room air  BMI 38.41 kg/m    Wt Readings from Last 3 Encounters:  02/03/16 241 lb 9.6 oz (109.6 kg)  12/26/15 244 lb 11 oz (111 kg)  12/18/15 244 lb 11.2 oz (111 kg)      Debbie [name], [name], RN 01/28/2016,12:38 PM

## 2016-01-29 ENCOUNTER — Ambulatory Visit: Payer: 59

## 2016-01-29 ENCOUNTER — Ambulatory Visit: Payer: 59 | Admitting: Radiation Oncology

## 2016-01-29 MED FILL — HYDROCHLOROTHIAZIDE 12.5 MG: 12.5 | 90 days supply | Qty: 90 | Fill #2

## 2016-01-29 MED FILL — KLOR-CON M20 TABLET: 20 | 30 days supply | Qty: 60 | Fill #6

## 2016-01-29 MED FILL — LIVALO 2 MG TABLET: 2 | 30 days supply | Qty: 30 | Fill #2

## 2016-02-03 ENCOUNTER — Encounter: Payer: Self-pay | Admitting: Radiation Oncology

## 2016-02-03 ENCOUNTER — Ambulatory Visit
Admission: RE | Admit: 2016-02-03 | Discharge: 2016-02-03 | Disposition: A | Discharge status: Home / Self Care | Payer: 59 | Source: Ambulatory Visit | Attending: Radiation Oncology | Admitting: Radiation Oncology

## 2016-02-03 DIAGNOSIS — C50211 Malignant neoplasm of upper-inner quadrant of right female breast: Secondary | ICD-10-CM | POA: Diagnosis not present

## 2016-02-03 DIAGNOSIS — C50112 Malignant neoplasm of central portion of left female breast: Secondary | ICD-10-CM

## 2016-02-03 DIAGNOSIS — Z170 Estrogen receptor positive status [ER+]: Secondary | ICD-10-CM | POA: Diagnosis not present

## 2016-02-03 DIAGNOSIS — C50219 Malignant neoplasm of upper-inner quadrant of unspecified female breast: Secondary | ICD-10-CM | POA: Diagnosis not present

## 2016-02-03 DIAGNOSIS — Z9013 Acquired absence of bilateral breasts and nipples: Secondary | ICD-10-CM | POA: Diagnosis not present

## 2016-02-03 DIAGNOSIS — Z510 Encounter for antineoplastic radiation therapy: Secondary | ICD-10-CM | POA: Diagnosis not present

## 2016-02-03 NOTE — Progress Notes (Signed)
Radiation Oncology         [phone] ________________________________  Name: Debbie [name] MRN: [id number]  Date: 02/03/2016  DOB: 1965-11-09  Follow-Up Visit Note  Outpatient  CC: Osborne Casco, MD  Jovita Kussmaul, MD  Diagnosis:      ICD-9-CM ICD-10-CM   1. Breast cancer of upper-inner quadrant of right female breast ([place]) 174.2 C50.211   2. Cancer of central portion of left breast ([facility]CC) 174.1 C50.112     Bilateral breast cancer, Right Breast pathologic Stage IIB T2N1a clinical M0; Left Breast, pathologic stage IA, T1cN0  Right breast histology reveals grade I invasive ductal carcinoma with DCIS, ER+, PR+, HER2 - negative Left breast histology reveals grade I invasive ductal carcinoma with DCIS and LCIS, ER+, PR+, HER-negative.    Narrative:  The patient returns today for follow-up. Since last visit, she underwent bilateral mastectomy with Dr. Marlou Starks.  Path reviewed.  There was a 0.2cm focus of residual DCIS in the left mastectomy tissue. No other residual disease. Margins negative.  3 additional right nodes were removed and were benign.  The left sentinel nodes were benign.   Lymphedema issues, if any:  She denies. She saw PT for one visit. She does notice swelling when her arms are dependent, but once elevated they improve. She has good arm movement bilaterally.   Pain issues, if any:  She denies   SAFETY ISSUES:  Prior radiation? She had "the pill" to shrink her Thyroid in 1996.  Pacemaker/ICD? No  Possible current pregnancy? No Is the patient on methotrexate? No          ALLERGIES:  is allergic to ace inhibitors; atorvastatin; and simvastatin.  Meds: Current Outpatient Prescriptions  Medication Sig Dispense Refill  . acetaminophen (TYLENOL) 325 MG tablet Take 650 mg by mouth every 6 (six) hours as needed for mild pain. Reported on 10/23/2015    . Cetirizine HCl (ZYRTEC ALLERGY PO) Take 1 tablet by mouth daily as needed (allergies).     .  hydrochlorothiazide (MICROZIDE) 12.5 MG capsule Take 12.5 mg by mouth daily.    Marland Kitchen levonorgestrel (MIRENA) 20 MCG/24HR IUD 1 each by Intrauterine route once.    Marland Kitchen levothyroxine (SYNTHROID, LEVOTHROID) 125 MCG tablet Take 125 mcg by mouth daily.    Marland Kitchen LIVALO 2 MG TABS Take 0.2 mg by mouth daily.   6  . losartan (COZAAR) 100 MG tablet Take 100 mg by mouth daily.    . metFORMIN (GLUCOPHAGE-XR) 750 MG 24 hr tablet Take 750 mg by mouth every evening.   6  . omega-3 acid ethyl esters (LOVAZA) 1 g capsule Take 1 g by mouth 2 (two) times daily.    . potassium chloride SA (K-DUR,KLOR-CON) 20 MEQ tablet Take 40 mEq by mouth 2 (two) times daily.     . tamoxifen (NOLVADEX) 20 MG tablet Take 20 mg by mouth daily.    . methocarbamol (ROBAXIN) 500 MG tablet Take 1 tablet (500 mg total) by mouth every 6 (six) hours as needed for muscle spasms. (Patient not taking: Reported on 02/03/2016) 40 tablet 0  . oxyCODONE-acetaminophen (ROXICET) 5-325 MG tablet Take 1-2 tablets by mouth every 4 (four) hours as needed. (Patient not taking: Reported on 02/03/2016) 50 tablet 0   No current facility-administered medications for this encounter.     Physical Findings:  height is 5' 6.5" (1.689 m) and weight is 241 lb 9.6 oz (109.6 kg). Her temperature is 98.3 F (36.8 C). Her blood pressure is 158/74 (abnormal)  and her pulse is 83. Her oxygen saturation is 100%. .    Vitals with Age-Percentiles 02/03/2016  Length 161.0 cm  Systolic 960  Diastolic 74  MAP   Pulse 83  Respiration   Weight 109.589 kg  BMI 38.5  VISIT REPORT      General: Alert and oriented, in no acute distress HEENT: Head is normocephalic. Extraocular movements are intact. Oropharynx is clear, but is notable for upper dentures.  Neck: Neck is supple, no palpable cervical or supraclavicular lymphadenopathy. Heart: Regular in rate and rhythm Chest: Clear to auscultation bilaterally, with no rhonchi, wheezes, or rales. Abdomen: Soft, nontender,  nondistended, with no rigidity or guarding. Extremities: No cyanosis or edema. Lymphatics: see Neck Exam Psychiatric: Judgment and insight are intact. Affect is appropriate. Breast exam reveals: bilateral mastectomy scars are healed  Lab Findings: Lab Results  Component Value Date   WBC 3.7 (L) 12/23/2015   HGB 12.2 12/23/2015   HCT 39.6 12/23/2015   MCV 90.8 12/23/2015   PLT 256 12/23/2015    Radiographic Findings: Dg Bone Density  Result Date: 01/09/2016 EXAM: DUAL X-RAY ABSORPTIOMETRY (DXA) FOR BONE MINERAL DENSITY IMPRESSION: Referring Physician:  Chauncey Cruel PATIENT: Name: Debbie, [name] Patient ID: [id number] Birth Date: 11-08-1965 Height: 65.5 in. Sex: Female Measured: 01/09/2016 Weight: 240.0 lbs. Indications: Breast Cancer History, Height Loss (781.91), Hypothyroid, Levothyroxine, Perimenopause Fractures: None Treatments: Birth Control, Calcium (E943.0) ASSESSMENT: The BMD measured at Femur Neck Right is 1.141 g/cm2 with a T-score of 0.7. This patient is considered normal according to [place] [facility]) criteria. Site Region Measured Date Measured Age YA BMD Significant CHANGE T-score DualFemur Neck Right 01/09/2016    52.1         0.7     1.141 g/cm2 AP Spine  L1-L4      01/09/2016    52.1         2.3     1.484 g/cm2 World Health Organization [facility]) criteria for post-menopausal, Caucasian Women: Normal       T-score at or above -1 SD Osteopenia   T-score between -1 and -2.5 SD Osteoporosis T-score at or below -2.5 SD RECOMMENDATION: [facility] recommends that FDA-approved medical therapies be considered in postmenopausal women and men age 59 or older with a: 1. Hip or vertebral (clinical or morphometric) fracture. 2. T-score of <-2.5 at the spine or hip. 3. Ten-year fracture probability by FRAX of 3% or greater for hip fracture or 20% or greater for major osteoporotic fracture. All treatment decisions require clinical judgment and  consideration of individual patient factors, including patient preferences, co-morbidities, previous drug use, risk factors not captured in the FRAX model (e.g. falls, vitamin D deficiency, increased bone turnover, interval significant decline in bone density) and possible under - or over-estimation of fracture risk by FRAX. All patients should ensure an adequate intake of dietary calcium (1200 mg/d) and vitamin D (800 IU daily) unless contraindicated. FOLLOW-UP: People with diagnosed cases of osteoporosis or at high risk for fracture should have regular bone mineral density tests. For patients eligible for Medicare, routine testing is allowed once every 2 years. The testing frequency can be increased to one year for patients who have rapidly progressing disease, those who are receiving or discontinuing medical therapy to restore bone mass, or have additional risk factors. I have reviewed this report, and agree with the above findings. [facility] Radiology Electronically Signed   By: Lajean Manes M.D.   On: 01/09/2016 14:38  Impression/Plan: bilateral breast cancer We again discussed adjuvant radiotherapy today.  I recommend 7 weeks of radiotherapy in order to to treat her right chest wall and nodes.  She will not need to treat the left chest wall, based on final path.  The risks, benefits and side effects of this treatment were discussed in detail.  She understands that radiotherapy is associated with skin irritation and fatigue in the acute setting. Late effects can include cosmetic changes and rare injury to internal organs.   She is enthusiastic about proceeding with treatment. A consent form has been  signed and placed in her chart. Proceed with CT simulation today. _____________________________________   [name], MD

## 2016-02-03 NOTE — Progress Notes (Addendum)
   Radiation Oncology         [phone] ________________________________  Name: Debbie [name] MRN: [id number]  Date: 02/03/2016  DOB: 1965-12-06  SIMULATION AND TREATMENT PLANNING NOTE    Outpatient  DIAGNOSIS:     ICD-9-CM ICD-10-CM   1. Breast cancer of upper-inner quadrant of right female breast ([place]) 174.2 C50.211     NARRATIVE:  The patient was brought to the [facility].  Identity was confirmed.  All relevant records and images related to the planned course of therapy were reviewed.  The patient freely provided informed written consent to proceed with treatment after reviewing the details related to the planned course of therapy. The consent form was witnessed and verified by the simulation staff.    Then, the patient was set-up in a stable reproducible supine position for radiation therapy with her ipsilateral arm over her head, and her upper body secured in a custom-made Vac-lok device.  CT images were obtained.  Surface markings were placed.  The CT images were loaded into the planning software.    TREATMENT PLANNING NOTE: Treatment planning then occurred.  The radiation prescription was entered and confirmed.     A total of 5 medically necessary complex treatment devices were fabricated and supervised by me: 4 fields with MLCs for custom blocks to protect heart, and lungs;  and, a Vac-lok. MORE COMPLEX DEVICES MAY BE MADE IN DOSIMETRY FOR FIELD IN FIELD BEAMS FOR DOSE HOMOGENEITY.  I have requested : 3D Simulation  I have requested a DVH of the following structures: lungs, heart, esophagus, cord.    The patient will receive 50.4 Gy in 28 fractions to the right chest wall with 2 tangential fields.  2 more fields will treat regional nodes to 45Gy/25 fractions. This will  be followed by a boost.  Optical Surface Tracking Plan:  Since intensity modulated radiotherapy (IMRT) and 3D conformal radiation treatment methods are predicated on accurate and precise  positioning for treatment, intrafraction motion monitoring is medically necessary to ensure accurate and safe treatment delivery. The ability to quantify intrafraction motion without excessive ionizing radiation dose can only be performed with optical surface tracking. Accordingly, surface imaging offers the opportunity to obtain 3D measurements of patient position throughout IMRT and 3D treatments without excessive radiation exposure. I am ordering optical surface tracking for this patient's upcoming course of radiotherapy.  ________________________________   Reference:  Ursula Alert, J, et al. Surface imaging-based analysis of intrafraction motion for breast radiotherapy patients.Journal of [place], n. 6, nov. 2014. ISSN [id number].  Available at: <http://www.jacmp.org/index.php/jacmp/article/view/4957>.    -----------------------------------  [name], MD

## 2016-02-03 NOTE — Addendum Note (Signed)
Encounter addended by: Ernst Spell, RN on: 02/03/2016 10:17 AM<BR>    Actions taken: Charge Capture section accepted

## 2016-02-04 MED FILL — LEVOTHYROXINE 125 MCG TAB: 125 | 30 days supply | Qty: 30 | Fill #0

## 2016-02-07 DIAGNOSIS — C50219 Malignant neoplasm of upper-inner quadrant of unspecified female breast: Secondary | ICD-10-CM | POA: Diagnosis not present

## 2016-02-07 DIAGNOSIS — C50211 Malignant neoplasm of upper-inner quadrant of right female breast: Secondary | ICD-10-CM | POA: Diagnosis not present

## 2016-02-07 DIAGNOSIS — C50112 Malignant neoplasm of central portion of left female breast: Secondary | ICD-10-CM | POA: Diagnosis not present

## 2016-02-07 DIAGNOSIS — Z170 Estrogen receptor positive status [ER+]: Secondary | ICD-10-CM | POA: Diagnosis not present

## 2016-02-07 DIAGNOSIS — Z9013 Acquired absence of bilateral breasts and nipples: Secondary | ICD-10-CM | POA: Diagnosis not present

## 2016-02-07 DIAGNOSIS — Z510 Encounter for antineoplastic radiation therapy: Secondary | ICD-10-CM | POA: Diagnosis not present

## 2016-02-10 ENCOUNTER — Ambulatory Visit
Admission: RE | Admit: 2016-02-10 | Discharge: 2016-02-10 | Disposition: A | Discharge status: Home / Self Care | Payer: 59 | Source: Ambulatory Visit | Attending: Radiation Oncology | Admitting: Radiation Oncology

## 2016-02-10 DIAGNOSIS — C50211 Malignant neoplasm of upper-inner quadrant of right female breast: Secondary | ICD-10-CM | POA: Diagnosis not present

## 2016-02-10 DIAGNOSIS — Z9013 Acquired absence of bilateral breasts and nipples: Secondary | ICD-10-CM | POA: Diagnosis not present

## 2016-02-10 DIAGNOSIS — Z510 Encounter for antineoplastic radiation therapy: Secondary | ICD-10-CM | POA: Diagnosis not present

## 2016-02-10 DIAGNOSIS — Z170 Estrogen receptor positive status [ER+]: Secondary | ICD-10-CM | POA: Diagnosis not present

## 2016-02-10 DIAGNOSIS — C50112 Malignant neoplasm of central portion of left female breast: Secondary | ICD-10-CM | POA: Diagnosis not present

## 2016-02-10 DIAGNOSIS — C50219 Malignant neoplasm of upper-inner quadrant of unspecified female breast: Secondary | ICD-10-CM | POA: Diagnosis not present

## 2016-02-11 ENCOUNTER — Ambulatory Visit
Admission: RE | Admit: 2016-02-11 | Discharge: 2016-02-11 | Disposition: A | Discharge status: Home / Self Care | Payer: 59 | Source: Ambulatory Visit | Attending: Radiation Oncology | Admitting: Radiation Oncology

## 2016-02-11 DIAGNOSIS — C50219 Malignant neoplasm of upper-inner quadrant of unspecified female breast: Secondary | ICD-10-CM | POA: Diagnosis not present

## 2016-02-11 DIAGNOSIS — C50112 Malignant neoplasm of central portion of left female breast: Secondary | ICD-10-CM | POA: Diagnosis not present

## 2016-02-11 DIAGNOSIS — Z510 Encounter for antineoplastic radiation therapy: Secondary | ICD-10-CM | POA: Diagnosis not present

## 2016-02-11 DIAGNOSIS — Z9013 Acquired absence of bilateral breasts and nipples: Secondary | ICD-10-CM | POA: Diagnosis not present

## 2016-02-11 DIAGNOSIS — C50211 Malignant neoplasm of upper-inner quadrant of right female breast: Secondary | ICD-10-CM | POA: Diagnosis not present

## 2016-02-11 DIAGNOSIS — Z170 Estrogen receptor positive status [ER+]: Secondary | ICD-10-CM | POA: Diagnosis not present

## 2016-02-12 ENCOUNTER — Ambulatory Visit
Admission: RE | Admit: 2016-02-12 | Discharge: 2016-02-12 | Disposition: A | Discharge status: Home / Self Care | Payer: 59 | Source: Ambulatory Visit | Attending: Radiation Oncology | Admitting: Radiation Oncology

## 2016-02-12 DIAGNOSIS — C50112 Malignant neoplasm of central portion of left female breast: Secondary | ICD-10-CM | POA: Diagnosis not present

## 2016-02-12 DIAGNOSIS — C50211 Malignant neoplasm of upper-inner quadrant of right female breast: Secondary | ICD-10-CM | POA: Diagnosis not present

## 2016-02-12 DIAGNOSIS — Z9013 Acquired absence of bilateral breasts and nipples: Secondary | ICD-10-CM | POA: Diagnosis not present

## 2016-02-12 DIAGNOSIS — Z170 Estrogen receptor positive status [ER+]: Secondary | ICD-10-CM | POA: Diagnosis not present

## 2016-02-12 DIAGNOSIS — Z510 Encounter for antineoplastic radiation therapy: Secondary | ICD-10-CM | POA: Diagnosis not present

## 2016-02-12 DIAGNOSIS — C50219 Malignant neoplasm of upper-inner quadrant of unspecified female breast: Secondary | ICD-10-CM | POA: Diagnosis not present

## 2016-02-13 ENCOUNTER — Ambulatory Visit
Admission: RE | Admit: 2016-02-13 | Discharge: 2016-02-13 | Disposition: A | Discharge status: Home / Self Care | Payer: 59 | Source: Ambulatory Visit | Attending: Radiation Oncology | Admitting: Radiation Oncology

## 2016-02-13 DIAGNOSIS — C50112 Malignant neoplasm of central portion of left female breast: Secondary | ICD-10-CM | POA: Diagnosis not present

## 2016-02-13 DIAGNOSIS — Z170 Estrogen receptor positive status [ER+]: Secondary | ICD-10-CM | POA: Diagnosis not present

## 2016-02-13 DIAGNOSIS — C50211 Malignant neoplasm of upper-inner quadrant of right female breast: Secondary | ICD-10-CM | POA: Insufficient documentation

## 2016-02-13 DIAGNOSIS — C50219 Malignant neoplasm of upper-inner quadrant of unspecified female breast: Secondary | ICD-10-CM | POA: Diagnosis not present

## 2016-02-13 DIAGNOSIS — Z510 Encounter for antineoplastic radiation therapy: Secondary | ICD-10-CM | POA: Diagnosis not present

## 2016-02-13 DIAGNOSIS — Z9013 Acquired absence of bilateral breasts and nipples: Secondary | ICD-10-CM | POA: Diagnosis not present

## 2016-02-13 MED ORDER — ALRA NON-METALLIC DEODORANT (RAD-ONC)
1.0000 "application " | Freq: Once | TOPICAL | Status: AC
  Administered 2016-02-13: 1 via TOPICAL

## 2016-02-13 MED ORDER — RADIAPLEXRX EX GEL
Freq: Once | CUTANEOUS | Status: AC
  Administered 2016-02-13: 16:00:00 via TOPICAL

## 2016-02-13 NOTE — Progress Notes (Signed)
Pt here for patient teaching.  Pt given Radiation and You booklet, skin care instructions, Alra deodorant and Radiaplex gel. Pt reports they have not watched the Radiation Therapy Education video, but were given the link to watch at home.  Reviewed areas of pertinence such as fatigue, skin changes, breast tenderness and breast swelling . Pt able to give teach back of to pat skin, use unscented/gentle soap and drink plenty of water,apply Radiaplex bid, avoid applying anything to skin within 4 hours of treatment, avoid wearing an under wire bra and to use an electric razor if they must shave. Pt verbalizes understanding of information given and will contact nursing with any questions or concerns.     Http://rtanswers.org/treatmentinformation/whattoexpect/index      

## 2016-02-14 ENCOUNTER — Ambulatory Visit: Payer: 59

## 2016-02-17 ENCOUNTER — Ambulatory Visit
Admission: RE | Admit: 2016-02-17 | Discharge: 2016-02-17 | Disposition: A | Discharge status: Home / Self Care | Payer: 59 | Source: Ambulatory Visit | Attending: Radiation Oncology | Admitting: Radiation Oncology

## 2016-02-17 ENCOUNTER — Ambulatory Visit: Admission: RE | Admit: 2016-02-17 | Payer: 59 | Source: Ambulatory Visit | Admitting: Radiation Oncology

## 2016-02-17 DIAGNOSIS — C50219 Malignant neoplasm of upper-inner quadrant of unspecified female breast: Secondary | ICD-10-CM | POA: Diagnosis not present

## 2016-02-17 DIAGNOSIS — Z170 Estrogen receptor positive status [ER+]: Secondary | ICD-10-CM | POA: Diagnosis not present

## 2016-02-17 DIAGNOSIS — Z510 Encounter for antineoplastic radiation therapy: Secondary | ICD-10-CM | POA: Diagnosis not present

## 2016-02-17 DIAGNOSIS — C50211 Malignant neoplasm of upper-inner quadrant of right female breast: Secondary | ICD-10-CM | POA: Diagnosis not present

## 2016-02-17 DIAGNOSIS — C50112 Malignant neoplasm of central portion of left female breast: Secondary | ICD-10-CM | POA: Diagnosis not present

## 2016-02-17 DIAGNOSIS — Z9013 Acquired absence of bilateral breasts and nipples: Secondary | ICD-10-CM | POA: Diagnosis not present

## 2016-02-18 ENCOUNTER — Encounter: Payer: Self-pay | Admitting: Radiation Oncology

## 2016-02-18 ENCOUNTER — Ambulatory Visit: Payer: 59

## 2016-02-18 ENCOUNTER — Ambulatory Visit
Admission: RE | Admit: 2016-02-18 | Discharge: 2016-02-18 | Disposition: A | Discharge status: Home / Self Care | Payer: 59 | Source: Ambulatory Visit | Attending: Radiation Oncology | Admitting: Radiation Oncology

## 2016-02-18 VITALS — BP 142/79 | HR 78 | Temp 97.9°F | Ht 66.5 in | Wt 240.8 lb

## 2016-02-18 DIAGNOSIS — Z9013 Acquired absence of bilateral breasts and nipples: Secondary | ICD-10-CM | POA: Diagnosis not present

## 2016-02-18 DIAGNOSIS — Z170 Estrogen receptor positive status [ER+]: Secondary | ICD-10-CM

## 2016-02-18 DIAGNOSIS — C50219 Malignant neoplasm of upper-inner quadrant of unspecified female breast: Secondary | ICD-10-CM | POA: Diagnosis not present

## 2016-02-18 DIAGNOSIS — Z510 Encounter for antineoplastic radiation therapy: Secondary | ICD-10-CM | POA: Diagnosis not present

## 2016-02-18 DIAGNOSIS — C50211 Malignant neoplasm of upper-inner quadrant of right female breast: Secondary | ICD-10-CM

## 2016-02-18 DIAGNOSIS — C50112 Malignant neoplasm of central portion of left female breast: Secondary | ICD-10-CM | POA: Diagnosis not present

## 2016-02-18 NOTE — Progress Notes (Signed)
   Weekly Management Note:  Outpatient    ICD-9-CM ICD-10-CM   1. Malignant neoplasm of upper-inner quadrant of right breast in female, estrogen receptor positive (HCC) 174.2 C50.211    V86.0 Z17.0     Current Dose:  9 Gy  Projected Dose: 60.4 Gy   Narrative:  The patient presents for routine under treatment assessment.  CBCT/MVCT images/Port film x-rays were reviewed.  The chart was checked. Doing well.  Physical Findings:  height is 5' 6.5" (1.689 m) and weight is 240 lb 12.8 oz (109.2 kg). Her temperature is 97.9 F (36.6 C). Her blood pressure is 142/79 (abnormal) and her pulse is 78.   Wt Readings from Last 3 Encounters:  02/18/16 240 lb 12.8 oz (109.2 kg)  02/03/16 241 lb 9.6 oz (109.6 kg)  12/26/15 244 lb 11 oz (111 kg)   Mild hyperpigmentation over right chest wall.  Impression:  The patient is tolerating radiotherapy.  Plan:  Continue radiotherapy as planned. Patient instructed to apply Radiplex to intact skin in treatment fields.    ________________________________   [name], M.D.

## 2016-02-18 NOTE — Progress Notes (Signed)
Debbie [name] is here for her 5th fraction of radiation to her Right Chest wall. She denies pain or fatigue. She denies any skin changes to her Right Chest. She is using the Radiaplex cream twice daily as ordered.   BP (!) 142/79   Pulse 78   Temp 97.9 F (36.6 C)   Ht 5' 6.5" (1.689 m)   Wt 240 lb 12.8 oz (109.2 kg)   BMI 38.28 kg/m    Wt Readings from Last 3 Encounters:  02/18/16 240 lb 12.8 oz (109.2 kg)  02/03/16 241 lb 9.6 oz (109.6 kg)  12/26/15 244 lb 11 oz (111 kg)

## 2016-02-19 ENCOUNTER — Ambulatory Visit: Payer: 59

## 2016-02-19 ENCOUNTER — Ambulatory Visit
Admission: RE | Admit: 2016-02-19 | Discharge: 2016-02-19 | Disposition: A | Discharge status: Home / Self Care | Payer: 59 | Source: Ambulatory Visit | Attending: Radiation Oncology | Admitting: Radiation Oncology

## 2016-02-19 DIAGNOSIS — C50112 Malignant neoplasm of central portion of left female breast: Secondary | ICD-10-CM | POA: Diagnosis not present

## 2016-02-19 DIAGNOSIS — Z510 Encounter for antineoplastic radiation therapy: Secondary | ICD-10-CM | POA: Diagnosis not present

## 2016-02-19 DIAGNOSIS — C50219 Malignant neoplasm of upper-inner quadrant of unspecified female breast: Secondary | ICD-10-CM | POA: Diagnosis not present

## 2016-02-19 DIAGNOSIS — Z9013 Acquired absence of bilateral breasts and nipples: Secondary | ICD-10-CM | POA: Diagnosis not present

## 2016-02-19 DIAGNOSIS — C50211 Malignant neoplasm of upper-inner quadrant of right female breast: Secondary | ICD-10-CM | POA: Diagnosis not present

## 2016-02-19 DIAGNOSIS — Z170 Estrogen receptor positive status [ER+]: Secondary | ICD-10-CM | POA: Diagnosis not present

## 2016-02-20 ENCOUNTER — Ambulatory Visit
Admission: RE | Admit: 2016-02-20 | Discharge: 2016-02-20 | Disposition: A | Discharge status: Home / Self Care | Payer: 59 | Source: Ambulatory Visit | Attending: Radiation Oncology | Admitting: Radiation Oncology

## 2016-02-20 ENCOUNTER — Ambulatory Visit: Payer: 59

## 2016-02-20 ENCOUNTER — Other Ambulatory Visit: Payer: Self-pay | Admitting: Oncology

## 2016-02-20 DIAGNOSIS — Z170 Estrogen receptor positive status [ER+]: Secondary | ICD-10-CM | POA: Diagnosis not present

## 2016-02-20 DIAGNOSIS — Z9013 Acquired absence of bilateral breasts and nipples: Secondary | ICD-10-CM | POA: Diagnosis not present

## 2016-02-20 DIAGNOSIS — C50211 Malignant neoplasm of upper-inner quadrant of right female breast: Secondary | ICD-10-CM | POA: Diagnosis not present

## 2016-02-20 DIAGNOSIS — Z510 Encounter for antineoplastic radiation therapy: Secondary | ICD-10-CM | POA: Diagnosis not present

## 2016-02-20 DIAGNOSIS — C50112 Malignant neoplasm of central portion of left female breast: Secondary | ICD-10-CM | POA: Diagnosis not present

## 2016-02-20 DIAGNOSIS — C50219 Malignant neoplasm of upper-inner quadrant of unspecified female breast: Secondary | ICD-10-CM | POA: Diagnosis not present

## 2016-02-20 MED FILL — METFORMIN HCL ER 750 MG TAB: 750 | 30 days supply | Qty: 30 | Fill #5

## 2016-02-21 ENCOUNTER — Ambulatory Visit
Admission: RE | Admit: 2016-02-21 | Discharge: 2016-02-21 | Disposition: A | Discharge status: Home / Self Care | Payer: 59 | Source: Ambulatory Visit | Attending: Radiation Oncology | Admitting: Radiation Oncology

## 2016-02-21 DIAGNOSIS — C50219 Malignant neoplasm of upper-inner quadrant of unspecified female breast: Secondary | ICD-10-CM | POA: Diagnosis not present

## 2016-02-21 DIAGNOSIS — Z170 Estrogen receptor positive status [ER+]: Secondary | ICD-10-CM | POA: Diagnosis not present

## 2016-02-21 DIAGNOSIS — Z9013 Acquired absence of bilateral breasts and nipples: Secondary | ICD-10-CM | POA: Diagnosis not present

## 2016-02-21 DIAGNOSIS — C50112 Malignant neoplasm of central portion of left female breast: Secondary | ICD-10-CM | POA: Diagnosis not present

## 2016-02-21 DIAGNOSIS — C50211 Malignant neoplasm of upper-inner quadrant of right female breast: Secondary | ICD-10-CM | POA: Diagnosis not present

## 2016-02-21 DIAGNOSIS — Z510 Encounter for antineoplastic radiation therapy: Secondary | ICD-10-CM | POA: Diagnosis not present

## 2016-02-24 ENCOUNTER — Ambulatory Visit
Admission: RE | Admit: 2016-02-24 | Discharge: 2016-02-24 | Disposition: A | Discharge status: Home / Self Care | Payer: 59 | Source: Ambulatory Visit | Attending: Radiation Oncology | Admitting: Radiation Oncology

## 2016-02-24 VITALS — BP 155/79 | HR 80 | Temp 98.4°F | Resp 18 | Ht 66.5 in | Wt 238.0 lb

## 2016-02-24 DIAGNOSIS — Z170 Estrogen receptor positive status [ER+]: Secondary | ICD-10-CM

## 2016-02-24 DIAGNOSIS — C50211 Malignant neoplasm of upper-inner quadrant of right female breast: Secondary | ICD-10-CM | POA: Diagnosis not present

## 2016-02-24 DIAGNOSIS — Z9013 Acquired absence of bilateral breasts and nipples: Secondary | ICD-10-CM | POA: Diagnosis not present

## 2016-02-24 DIAGNOSIS — C50112 Malignant neoplasm of central portion of left female breast: Secondary | ICD-10-CM | POA: Diagnosis not present

## 2016-02-24 DIAGNOSIS — Z510 Encounter for antineoplastic radiation therapy: Secondary | ICD-10-CM | POA: Diagnosis not present

## 2016-02-24 DIAGNOSIS — C50219 Malignant neoplasm of upper-inner quadrant of unspecified female breast: Secondary | ICD-10-CM | POA: Diagnosis not present

## 2016-02-24 NOTE — Progress Notes (Signed)
Debbie [name] is here for her 5th fraction of radiation to her Right Chest wall. She denies pain today.  Reports mild fatigue.  Has hyperpigmentation  to her Right Chest. She is using the Radiaplex gel twice daily as ordered.  Wt Readings from Last 3 Encounters:  02/24/16 238 lb (108 kg)  02/18/16 240 lb 12.8 oz (109.2 kg)  02/03/16 241 lb 9.6 oz (109.6 kg)  BP (!) 155/79 (BP Location: Right Leg, Patient Position: Sitting, Cuff Size: Normal)   Pulse 80   Temp 98.4 F (36.9 C) (Oral)   Resp 18   Ht 5' 6.5" (1.689 m)   Wt 238 lb (108 kg)   LMP 12/15/2015   SpO2 100%   BMI 37.84 kg/m

## 2016-02-24 NOTE — Progress Notes (Signed)
   Weekly Management Note:  Outpatient    ICD-9-CM ICD-10-CM   1. Malignant neoplasm of upper-inner quadrant of right breast in female, estrogen receptor positive ([facility]CC) 174.2 C50.211    V86.0 Z17.0     Current Dose:  16.2 Gy  Projected Dose: 60.4 Gy   Narrative:  The patient presents for routine under treatment assessment.  CBCT/MVCT images/Port film x-rays were reviewed.  The chart was checked. Doing well.  Physical Findings:  height is 5' 6.5" (1.689 m) and weight is 238 lb (108 kg). Her oral temperature is 98.4 F (36.9 C). Her blood pressure is 155/79 (abnormal) and her pulse is 80. Her respiration is 18 and oxygen saturation is 100%.   Wt Readings from Last 3 Encounters:  02/24/16 238 lb (108 kg)  02/18/16 240 lb 12.8 oz (109.2 kg)  02/03/16 241 lb 9.6 oz (109.6 kg)   Mild hyperpigmentation over right chest wall.  Impression:  The patient is tolerating radiotherapy.  Plan:  Continue radiotherapy as planned. Patient instructed to continue to apply Radiplex to intact skin in treatment fields.    ________________________________   [name], M.D.

## 2016-02-25 ENCOUNTER — Ambulatory Visit
Admission: RE | Admit: 2016-02-25 | Discharge: 2016-02-25 | Disposition: A | Discharge status: Home / Self Care | Payer: 59 | Source: Ambulatory Visit | Attending: Radiation Oncology | Admitting: Radiation Oncology

## 2016-02-25 DIAGNOSIS — Z510 Encounter for antineoplastic radiation therapy: Secondary | ICD-10-CM | POA: Diagnosis not present

## 2016-02-25 DIAGNOSIS — C50219 Malignant neoplasm of upper-inner quadrant of unspecified female breast: Secondary | ICD-10-CM | POA: Diagnosis not present

## 2016-02-25 DIAGNOSIS — Z9013 Acquired absence of bilateral breasts and nipples: Secondary | ICD-10-CM | POA: Diagnosis not present

## 2016-02-25 DIAGNOSIS — Z170 Estrogen receptor positive status [ER+]: Secondary | ICD-10-CM | POA: Diagnosis not present

## 2016-02-25 DIAGNOSIS — C50211 Malignant neoplasm of upper-inner quadrant of right female breast: Secondary | ICD-10-CM | POA: Diagnosis not present

## 2016-02-25 DIAGNOSIS — C50112 Malignant neoplasm of central portion of left female breast: Secondary | ICD-10-CM | POA: Diagnosis not present

## 2016-02-26 ENCOUNTER — Ambulatory Visit
Admission: RE | Admit: 2016-02-26 | Discharge: 2016-02-26 | Disposition: A | Discharge status: Home / Self Care | Payer: 59 | Source: Ambulatory Visit | Attending: Radiation Oncology | Admitting: Radiation Oncology

## 2016-02-26 DIAGNOSIS — Z170 Estrogen receptor positive status [ER+]: Secondary | ICD-10-CM | POA: Diagnosis not present

## 2016-02-26 DIAGNOSIS — Z510 Encounter for antineoplastic radiation therapy: Secondary | ICD-10-CM | POA: Diagnosis not present

## 2016-02-26 DIAGNOSIS — Z9013 Acquired absence of bilateral breasts and nipples: Secondary | ICD-10-CM | POA: Diagnosis not present

## 2016-02-26 DIAGNOSIS — C50219 Malignant neoplasm of upper-inner quadrant of unspecified female breast: Secondary | ICD-10-CM | POA: Diagnosis not present

## 2016-02-26 DIAGNOSIS — C50211 Malignant neoplasm of upper-inner quadrant of right female breast: Secondary | ICD-10-CM | POA: Diagnosis not present

## 2016-02-26 DIAGNOSIS — C50112 Malignant neoplasm of central portion of left female breast: Secondary | ICD-10-CM | POA: Diagnosis not present

## 2016-02-27 ENCOUNTER — Ambulatory Visit
Admission: RE | Admit: 2016-02-27 | Discharge: 2016-02-27 | Disposition: A | Discharge status: Home / Self Care | Payer: 59 | Source: Ambulatory Visit | Attending: Radiation Oncology | Admitting: Radiation Oncology

## 2016-02-27 DIAGNOSIS — C50219 Malignant neoplasm of upper-inner quadrant of unspecified female breast: Secondary | ICD-10-CM | POA: Diagnosis not present

## 2016-02-27 DIAGNOSIS — Z170 Estrogen receptor positive status [ER+]: Secondary | ICD-10-CM | POA: Diagnosis not present

## 2016-02-27 DIAGNOSIS — Z510 Encounter for antineoplastic radiation therapy: Secondary | ICD-10-CM | POA: Diagnosis not present

## 2016-02-27 DIAGNOSIS — C50112 Malignant neoplasm of central portion of left female breast: Secondary | ICD-10-CM | POA: Diagnosis not present

## 2016-02-27 DIAGNOSIS — C50211 Malignant neoplasm of upper-inner quadrant of right female breast: Secondary | ICD-10-CM | POA: Diagnosis not present

## 2016-02-27 DIAGNOSIS — Z9013 Acquired absence of bilateral breasts and nipples: Secondary | ICD-10-CM | POA: Diagnosis not present

## 2016-02-28 ENCOUNTER — Ambulatory Visit
Admission: RE | Admit: 2016-02-28 | Discharge: 2016-02-28 | Disposition: A | Discharge status: Home / Self Care | Payer: 59 | Source: Ambulatory Visit | Attending: Radiation Oncology | Admitting: Radiation Oncology

## 2016-02-28 DIAGNOSIS — C50112 Malignant neoplasm of central portion of left female breast: Secondary | ICD-10-CM | POA: Diagnosis not present

## 2016-02-28 DIAGNOSIS — Z170 Estrogen receptor positive status [ER+]: Secondary | ICD-10-CM | POA: Diagnosis not present

## 2016-02-28 DIAGNOSIS — C50219 Malignant neoplasm of upper-inner quadrant of unspecified female breast: Secondary | ICD-10-CM | POA: Diagnosis not present

## 2016-02-28 DIAGNOSIS — C50211 Malignant neoplasm of upper-inner quadrant of right female breast: Secondary | ICD-10-CM | POA: Diagnosis not present

## 2016-02-28 DIAGNOSIS — Z510 Encounter for antineoplastic radiation therapy: Secondary | ICD-10-CM | POA: Diagnosis not present

## 2016-02-28 DIAGNOSIS — Z9013 Acquired absence of bilateral breasts and nipples: Secondary | ICD-10-CM | POA: Diagnosis not present

## 2016-03-02 ENCOUNTER — Ambulatory Visit
Admission: RE | Admit: 2016-03-02 | Discharge: 2016-03-02 | Disposition: A | Discharge status: Home / Self Care | Payer: 59 | Source: Ambulatory Visit | Attending: Radiation Oncology | Admitting: Radiation Oncology

## 2016-03-02 ENCOUNTER — Encounter: Payer: Self-pay | Admitting: Radiation Oncology

## 2016-03-02 VITALS — BP 135/76 | HR 77 | Temp 98.6°F | Ht 66.5 in | Wt 240.2 lb

## 2016-03-02 DIAGNOSIS — Z170 Estrogen receptor positive status [ER+]: Secondary | ICD-10-CM

## 2016-03-02 DIAGNOSIS — C50112 Malignant neoplasm of central portion of left female breast: Secondary | ICD-10-CM | POA: Diagnosis not present

## 2016-03-02 DIAGNOSIS — Z510 Encounter for antineoplastic radiation therapy: Secondary | ICD-10-CM | POA: Diagnosis not present

## 2016-03-02 DIAGNOSIS — C50211 Malignant neoplasm of upper-inner quadrant of right female breast: Secondary | ICD-10-CM

## 2016-03-02 DIAGNOSIS — C50219 Malignant neoplasm of upper-inner quadrant of unspecified female breast: Secondary | ICD-10-CM | POA: Diagnosis not present

## 2016-03-02 DIAGNOSIS — Z9013 Acquired absence of bilateral breasts and nipples: Secondary | ICD-10-CM | POA: Diagnosis not present

## 2016-03-02 NOTE — Progress Notes (Addendum)
Ms. Debbie [name] has received 14 fractions to her right chest wall. Note hyperpigmentation of chest wall with soft and intact skin.  She reports occassional pains in her breast and explained this is related to the incisional area and treatment, but will dissipate over time.  She notes mild fatigue and continues to work full time.  BP 135/76 (BP Location: Left Arm, Patient Position: Sitting, Cuff Size: Large)   Pulse 77   Temp 98.6 F (37 C) (Oral)   Ht 5' 6.5" (1.689 m)   Wt 240 lb 3.2 oz (109 kg)   LMP 12/15/2015   BMI 38.19 kg/m    Wt Readings from Last 3 Encounters:  03/02/16 240 lb 3.2 oz (109 kg)  02/24/16 238 lb (108 kg)  02/18/16 240 lb 12.8 oz (109.2 kg)

## 2016-03-02 NOTE — Progress Notes (Signed)
   Weekly Management Note:  Outpatient    ICD-9-CM ICD-10-CM   1. Malignant neoplasm of upper-inner quadrant of right breast in female, estrogen receptor positive ([facility]CC) 174.2 C50.211    V86.0 Z17.0     Current Dose:  25.2 Gy  Projected Dose: 60.4 Gy   Narrative:  The patient presents for routine under treatment assessment.  CBCT/MVCT images/Port film x-rays were reviewed.  The chart was checked. A little tired, Doing well.  Physical Findings:  height is 5' 6.5" (1.689 m) and weight is 240 lb 3.2 oz (109 kg). Her oral temperature is 98.6 F (37 C). Her blood pressure is 135/76 and her pulse is 77.   Wt Readings from Last 3 Encounters:  03/02/16 240 lb 3.2 oz (109 kg)  02/24/16 238 lb (108 kg)  02/18/16 240 lb 12.8 oz (109.2 kg)   Mild hyperpigmentation over right chest wall. Relatively stable exam  Impression:  The patient is tolerating radiotherapy.  Plan:  Continue radiotherapy as planned. Patient instructed to continue to apply Radiplex to intact skin in treatment fields.   ________________________________   [name], M.D.

## 2016-03-03 ENCOUNTER — Ambulatory Visit
Admission: RE | Admit: 2016-03-03 | Discharge: 2016-03-03 | Disposition: A | Discharge status: Home / Self Care | Payer: 59 | Source: Ambulatory Visit | Attending: Radiation Oncology | Admitting: Radiation Oncology

## 2016-03-03 DIAGNOSIS — C50211 Malignant neoplasm of upper-inner quadrant of right female breast: Secondary | ICD-10-CM | POA: Diagnosis not present

## 2016-03-03 DIAGNOSIS — C50112 Malignant neoplasm of central portion of left female breast: Secondary | ICD-10-CM | POA: Diagnosis not present

## 2016-03-03 DIAGNOSIS — Z510 Encounter for antineoplastic radiation therapy: Secondary | ICD-10-CM | POA: Diagnosis not present

## 2016-03-03 DIAGNOSIS — Z9013 Acquired absence of bilateral breasts and nipples: Secondary | ICD-10-CM | POA: Diagnosis not present

## 2016-03-03 DIAGNOSIS — Z170 Estrogen receptor positive status [ER+]: Secondary | ICD-10-CM | POA: Diagnosis not present

## 2016-03-03 DIAGNOSIS — C50219 Malignant neoplasm of upper-inner quadrant of unspecified female breast: Secondary | ICD-10-CM | POA: Diagnosis not present

## 2016-03-04 ENCOUNTER — Other Ambulatory Visit: Payer: 59

## 2016-03-04 ENCOUNTER — Ambulatory Visit: Payer: 59 | Admitting: Oncology

## 2016-03-04 ENCOUNTER — Ambulatory Visit
Admission: RE | Admit: 2016-03-04 | Discharge: 2016-03-04 | Disposition: A | Discharge status: Home / Self Care | Payer: 59 | Source: Ambulatory Visit | Attending: Radiation Oncology | Admitting: Radiation Oncology

## 2016-03-04 DIAGNOSIS — Z9013 Acquired absence of bilateral breasts and nipples: Secondary | ICD-10-CM | POA: Diagnosis not present

## 2016-03-04 DIAGNOSIS — C50112 Malignant neoplasm of central portion of left female breast: Secondary | ICD-10-CM | POA: Diagnosis not present

## 2016-03-04 DIAGNOSIS — Z510 Encounter for antineoplastic radiation therapy: Secondary | ICD-10-CM | POA: Diagnosis not present

## 2016-03-04 DIAGNOSIS — C50219 Malignant neoplasm of upper-inner quadrant of unspecified female breast: Secondary | ICD-10-CM | POA: Diagnosis not present

## 2016-03-04 DIAGNOSIS — Z170 Estrogen receptor positive status [ER+]: Secondary | ICD-10-CM | POA: Diagnosis not present

## 2016-03-04 DIAGNOSIS — C50211 Malignant neoplasm of upper-inner quadrant of right female breast: Secondary | ICD-10-CM | POA: Diagnosis not present

## 2016-03-05 ENCOUNTER — Ambulatory Visit
Admission: RE | Admit: 2016-03-05 | Discharge: 2016-03-05 | Disposition: A | Discharge status: Home / Self Care | Payer: 59 | Source: Ambulatory Visit | Attending: Radiation Oncology | Admitting: Radiation Oncology

## 2016-03-05 DIAGNOSIS — C50219 Malignant neoplasm of upper-inner quadrant of unspecified female breast: Secondary | ICD-10-CM | POA: Diagnosis not present

## 2016-03-05 DIAGNOSIS — C50112 Malignant neoplasm of central portion of left female breast: Secondary | ICD-10-CM | POA: Diagnosis not present

## 2016-03-05 DIAGNOSIS — Z170 Estrogen receptor positive status [ER+]: Secondary | ICD-10-CM | POA: Diagnosis not present

## 2016-03-05 DIAGNOSIS — C50211 Malignant neoplasm of upper-inner quadrant of right female breast: Secondary | ICD-10-CM | POA: Diagnosis not present

## 2016-03-05 DIAGNOSIS — Z510 Encounter for antineoplastic radiation therapy: Secondary | ICD-10-CM | POA: Diagnosis not present

## 2016-03-05 DIAGNOSIS — Z9013 Acquired absence of bilateral breasts and nipples: Secondary | ICD-10-CM | POA: Diagnosis not present

## 2016-03-05 MED FILL — LEVOTHYROXINE 125 MCG TAB: 125 | 30 days supply | Qty: 30 | Fill #1

## 2016-03-05 MED FILL — LIVALO 2 MG TABLET: 2 | 30 days supply | Qty: 30 | Fill #3

## 2016-03-06 ENCOUNTER — Ambulatory Visit
Admission: RE | Admit: 2016-03-06 | Discharge: 2016-03-06 | Disposition: A | Discharge status: Home / Self Care | Payer: 59 | Source: Ambulatory Visit | Attending: Radiation Oncology | Admitting: Radiation Oncology

## 2016-03-06 DIAGNOSIS — Z9013 Acquired absence of bilateral breasts and nipples: Secondary | ICD-10-CM | POA: Diagnosis not present

## 2016-03-06 DIAGNOSIS — C50211 Malignant neoplasm of upper-inner quadrant of right female breast: Secondary | ICD-10-CM | POA: Diagnosis not present

## 2016-03-06 DIAGNOSIS — Z170 Estrogen receptor positive status [ER+]: Secondary | ICD-10-CM | POA: Diagnosis not present

## 2016-03-06 DIAGNOSIS — C50219 Malignant neoplasm of upper-inner quadrant of unspecified female breast: Secondary | ICD-10-CM | POA: Diagnosis not present

## 2016-03-06 DIAGNOSIS — C50112 Malignant neoplasm of central portion of left female breast: Secondary | ICD-10-CM | POA: Diagnosis not present

## 2016-03-06 DIAGNOSIS — Z510 Encounter for antineoplastic radiation therapy: Secondary | ICD-10-CM | POA: Diagnosis not present

## 2016-03-09 ENCOUNTER — Ambulatory Visit
Admission: RE | Admit: 2016-03-09 | Discharge: 2016-03-09 | Disposition: A | Discharge status: Home / Self Care | Payer: 59 | Source: Ambulatory Visit | Attending: Radiation Oncology | Admitting: Radiation Oncology

## 2016-03-09 DIAGNOSIS — C50112 Malignant neoplasm of central portion of left female breast: Secondary | ICD-10-CM | POA: Diagnosis not present

## 2016-03-09 DIAGNOSIS — C50211 Malignant neoplasm of upper-inner quadrant of right female breast: Secondary | ICD-10-CM

## 2016-03-09 DIAGNOSIS — Z9013 Acquired absence of bilateral breasts and nipples: Secondary | ICD-10-CM | POA: Diagnosis not present

## 2016-03-09 DIAGNOSIS — Z510 Encounter for antineoplastic radiation therapy: Secondary | ICD-10-CM | POA: Diagnosis not present

## 2016-03-09 DIAGNOSIS — Z170 Estrogen receptor positive status [ER+]: Secondary | ICD-10-CM | POA: Diagnosis not present

## 2016-03-09 DIAGNOSIS — C50219 Malignant neoplasm of upper-inner quadrant of unspecified female breast: Secondary | ICD-10-CM | POA: Diagnosis not present

## 2016-03-09 NOTE — Progress Notes (Signed)
   Weekly Management Note:  Outpatient    ICD-9-CM ICD-10-CM   1. Malignant neoplasm of upper-inner quadrant of right breast in female, estrogen receptor positive ([facility]CC) 174.2 C50.211    V86.0 Z17.0     Current Dose:  34.2 Gy  Projected Dose: 60.4 Gy   Narrative:  The patient presents for routine under treatment assessment.  CBCT/MVCT images/Port film x-rays were reviewed.  The chart was checked.  Doing well.  Physical Findings:  vitals were not taken for this visit.  Wt Readings from Last 3 Encounters:  03/02/16 240 lb 3.2 oz (109 kg)  02/24/16 238 lb (108 kg)  02/18/16 240 lb 12.8 oz (109.2 kg)   Moderate  hyperpigmentation over right chest wall with small areas of early moist peeling in right axilla  Impression:  The patient is tolerating radiotherapy.  Plan:  Continue radiotherapy as planned. Patient instructed to continue to apply Radiplex to intact skin in treatment fields. Apply neosporin instead to moist peeling    ________________________________   Debbie [name], M.D.

## 2016-03-10 ENCOUNTER — Ambulatory Visit
Admission: RE | Admit: 2016-03-10 | Discharge: 2016-03-10 | Disposition: A | Discharge status: Home / Self Care | Payer: 59 | Source: Ambulatory Visit | Attending: Radiation Oncology | Admitting: Radiation Oncology

## 2016-03-10 DIAGNOSIS — Z510 Encounter for antineoplastic radiation therapy: Secondary | ICD-10-CM | POA: Diagnosis not present

## 2016-03-10 DIAGNOSIS — Z170 Estrogen receptor positive status [ER+]: Secondary | ICD-10-CM | POA: Diagnosis not present

## 2016-03-10 DIAGNOSIS — C50219 Malignant neoplasm of upper-inner quadrant of unspecified female breast: Secondary | ICD-10-CM | POA: Diagnosis not present

## 2016-03-10 DIAGNOSIS — C50112 Malignant neoplasm of central portion of left female breast: Secondary | ICD-10-CM | POA: Diagnosis not present

## 2016-03-10 DIAGNOSIS — Z9013 Acquired absence of bilateral breasts and nipples: Secondary | ICD-10-CM | POA: Diagnosis not present

## 2016-03-10 DIAGNOSIS — C50211 Malignant neoplasm of upper-inner quadrant of right female breast: Secondary | ICD-10-CM | POA: Diagnosis not present

## 2016-03-11 ENCOUNTER — Ambulatory Visit
Admission: RE | Admit: 2016-03-11 | Discharge: 2016-03-11 | Disposition: A | Discharge status: Home / Self Care | Payer: 59 | Source: Ambulatory Visit | Attending: Radiation Oncology | Admitting: Radiation Oncology

## 2016-03-11 DIAGNOSIS — Z9013 Acquired absence of bilateral breasts and nipples: Secondary | ICD-10-CM | POA: Diagnosis not present

## 2016-03-11 DIAGNOSIS — Z510 Encounter for antineoplastic radiation therapy: Secondary | ICD-10-CM | POA: Diagnosis not present

## 2016-03-11 DIAGNOSIS — C50211 Malignant neoplasm of upper-inner quadrant of right female breast: Secondary | ICD-10-CM | POA: Diagnosis not present

## 2016-03-11 DIAGNOSIS — C50112 Malignant neoplasm of central portion of left female breast: Secondary | ICD-10-CM | POA: Diagnosis not present

## 2016-03-11 DIAGNOSIS — C50219 Malignant neoplasm of upper-inner quadrant of unspecified female breast: Secondary | ICD-10-CM | POA: Diagnosis not present

## 2016-03-11 DIAGNOSIS — Z170 Estrogen receptor positive status [ER+]: Secondary | ICD-10-CM | POA: Diagnosis not present

## 2016-03-12 ENCOUNTER — Ambulatory Visit
Admission: RE | Admit: 2016-03-12 | Discharge: 2016-03-12 | Disposition: A | Discharge status: Home / Self Care | Payer: 59 | Source: Ambulatory Visit | Attending: Radiation Oncology | Admitting: Radiation Oncology

## 2016-03-12 DIAGNOSIS — C50219 Malignant neoplasm of upper-inner quadrant of unspecified female breast: Secondary | ICD-10-CM | POA: Diagnosis not present

## 2016-03-12 DIAGNOSIS — Z170 Estrogen receptor positive status [ER+]: Secondary | ICD-10-CM | POA: Diagnosis not present

## 2016-03-12 DIAGNOSIS — C50112 Malignant neoplasm of central portion of left female breast: Secondary | ICD-10-CM | POA: Diagnosis not present

## 2016-03-12 DIAGNOSIS — C50211 Malignant neoplasm of upper-inner quadrant of right female breast: Secondary | ICD-10-CM | POA: Diagnosis not present

## 2016-03-12 DIAGNOSIS — Z510 Encounter for antineoplastic radiation therapy: Secondary | ICD-10-CM | POA: Diagnosis not present

## 2016-03-12 DIAGNOSIS — Z9013 Acquired absence of bilateral breasts and nipples: Secondary | ICD-10-CM | POA: Diagnosis not present

## 2016-03-13 ENCOUNTER — Ambulatory Visit
Admission: RE | Admit: 2016-03-13 | Discharge: 2016-03-13 | Disposition: A | Discharge status: Home / Self Care | Payer: 59 | Source: Ambulatory Visit | Attending: Radiation Oncology | Admitting: Radiation Oncology

## 2016-03-13 DIAGNOSIS — Z170 Estrogen receptor positive status [ER+]: Secondary | ICD-10-CM | POA: Diagnosis not present

## 2016-03-13 DIAGNOSIS — Z9013 Acquired absence of bilateral breasts and nipples: Secondary | ICD-10-CM | POA: Diagnosis not present

## 2016-03-13 DIAGNOSIS — C50211 Malignant neoplasm of upper-inner quadrant of right female breast: Secondary | ICD-10-CM | POA: Diagnosis not present

## 2016-03-13 DIAGNOSIS — C50219 Malignant neoplasm of upper-inner quadrant of unspecified female breast: Secondary | ICD-10-CM | POA: Diagnosis not present

## 2016-03-13 DIAGNOSIS — C50112 Malignant neoplasm of central portion of left female breast: Secondary | ICD-10-CM | POA: Diagnosis not present

## 2016-03-13 DIAGNOSIS — Z510 Encounter for antineoplastic radiation therapy: Secondary | ICD-10-CM | POA: Diagnosis not present

## 2016-03-16 ENCOUNTER — Ambulatory Visit
Admission: RE | Admit: 2016-03-16 | Discharge: 2016-03-16 | Disposition: A | Discharge status: Home / Self Care | Payer: 59 | Source: Ambulatory Visit | Attending: Radiation Oncology | Admitting: Radiation Oncology

## 2016-03-16 ENCOUNTER — Ambulatory Visit: Payer: 59 | Admitting: Radiation Oncology

## 2016-03-16 ENCOUNTER — Encounter: Payer: Self-pay | Admitting: Radiation Oncology

## 2016-03-16 VITALS — BP 160/90 | HR 82 | Temp 98.6°F | Resp 18 | Ht 66.5 in | Wt 240.4 lb

## 2016-03-16 DIAGNOSIS — Z9013 Acquired absence of bilateral breasts and nipples: Secondary | ICD-10-CM | POA: Diagnosis not present

## 2016-03-16 DIAGNOSIS — Z170 Estrogen receptor positive status [ER+]: Secondary | ICD-10-CM | POA: Diagnosis not present

## 2016-03-16 DIAGNOSIS — C50211 Malignant neoplasm of upper-inner quadrant of right female breast: Secondary | ICD-10-CM | POA: Diagnosis not present

## 2016-03-16 DIAGNOSIS — C50112 Malignant neoplasm of central portion of left female breast: Secondary | ICD-10-CM | POA: Diagnosis not present

## 2016-03-16 DIAGNOSIS — Z510 Encounter for antineoplastic radiation therapy: Secondary | ICD-10-CM | POA: Diagnosis not present

## 2016-03-16 DIAGNOSIS — C50219 Malignant neoplasm of upper-inner quadrant of unspecified female breast: Secondary | ICD-10-CM | POA: Diagnosis not present

## 2016-03-16 MED FILL — KLOR-CON M20 TABLET: 20 | 30 days supply | Qty: 60 | Fill #7

## 2016-03-16 NOTE — Progress Notes (Signed)
   Weekly Management Note:  Outpatient    ICD-9-CM ICD-10-CM   1. Malignant neoplasm of upper-inner quadrant of right breast in female, estrogen receptor positive (HCC) 174.2 C50.211    V86.0 Z17.0     Current Dose:  43.2 Gy  Projected Dose: 60.4 Gy   Narrative:  The patient presents for routine under treatment assessment.  CBCT/MVCT images/Port film x-rays were reviewed.  The chart was checked.  Doing well. Some burning sensation over skin.  Working full time.  Physical Findings:  height is 5' 6.5" (1.689 m) and weight is 240 lb 6.4 oz (109 kg). Her oral temperature is 98.6 F (37 C). Her blood pressure is 160/90 (abnormal) and her pulse is 82. Her respiration is 18 and oxygen saturation is 100%.   Wt Readings from Last 3 Encounters:  03/16/16 240 lb 6.4 oz (109 kg)  03/02/16 240 lb 3.2 oz (109 kg)  02/24/16 238 lb (108 kg)   increased hyperpigmentation over right chest wall with superficial moist peeling in right axilla and punctate moist peeling over chest wall  Impression:  The patient is tolerating radiotherapy.  Plan:  Continue radiotherapy as planned. Patient instructed to continue to apply Radiplex to intact skin in treatment fields. Apply neosporin instead to moist peeling    ________________________________   [name], M.D.

## 2016-03-16 NOTE — Progress Notes (Signed)
     Debbie [name] has received 24 fractions to her right chest wall. Has hyperpigmentation of chest wall with peeling under the axilla  and outer breast otherwise skin is intact using neosporin ointment and Radiaplex gel.  She reports occassional pains and burning in her breast and explained this is related to the incisional area and treatment, but will dissipate over time.  She notes mild fatigue and continues to work full time. Wt Readings from Last 3 Encounters:  03/16/16 240 lb 6.4 oz (109 kg)  03/02/16 240 lb 3.2 oz (109 kg)  02/24/16 238 lb (108 kg)  BP (!) 160/90   Pulse 82   Temp 98.6 F (37 C) (Oral)   Resp 18   Ht 5' 6.5" (1.689 m)   Wt 240 lb 6.4 oz (109 kg)   LMP 12/15/2015   SpO2 100%   BMI 38.22 kg/m

## 2016-03-17 ENCOUNTER — Ambulatory Visit
Admission: RE | Admit: 2016-03-17 | Discharge: 2016-03-17 | Disposition: A | Discharge status: Home / Self Care | Payer: 59 | Source: Ambulatory Visit | Attending: Radiation Oncology | Admitting: Radiation Oncology

## 2016-03-17 DIAGNOSIS — Z510 Encounter for antineoplastic radiation therapy: Secondary | ICD-10-CM | POA: Insufficient documentation

## 2016-03-17 DIAGNOSIS — Z170 Estrogen receptor positive status [ER+]: Secondary | ICD-10-CM | POA: Diagnosis not present

## 2016-03-17 DIAGNOSIS — C50211 Malignant neoplasm of upper-inner quadrant of right female breast: Secondary | ICD-10-CM | POA: Diagnosis not present

## 2016-03-18 ENCOUNTER — Ambulatory Visit
Admission: RE | Admit: 2016-03-18 | Discharge: 2016-03-18 | Disposition: A | Discharge status: Home / Self Care | Payer: 59 | Source: Ambulatory Visit | Attending: Radiation Oncology | Admitting: Radiation Oncology

## 2016-03-18 DIAGNOSIS — Z510 Encounter for antineoplastic radiation therapy: Secondary | ICD-10-CM | POA: Diagnosis not present

## 2016-03-18 DIAGNOSIS — C50211 Malignant neoplasm of upper-inner quadrant of right female breast: Secondary | ICD-10-CM | POA: Diagnosis not present

## 2016-03-18 DIAGNOSIS — Z170 Estrogen receptor positive status [ER+]: Secondary | ICD-10-CM | POA: Diagnosis not present

## 2016-03-19 ENCOUNTER — Ambulatory Visit
Admission: RE | Admit: 2016-03-19 | Discharge: 2016-03-19 | Disposition: A | Discharge status: Home / Self Care | Payer: 59 | Source: Ambulatory Visit | Attending: Radiation Oncology | Admitting: Radiation Oncology

## 2016-03-19 DIAGNOSIS — C50211 Malignant neoplasm of upper-inner quadrant of right female breast: Secondary | ICD-10-CM | POA: Diagnosis not present

## 2016-03-19 DIAGNOSIS — Z170 Estrogen receptor positive status [ER+]: Secondary | ICD-10-CM | POA: Diagnosis not present

## 2016-03-19 DIAGNOSIS — Z510 Encounter for antineoplastic radiation therapy: Secondary | ICD-10-CM | POA: Diagnosis not present

## 2016-03-20 ENCOUNTER — Ambulatory Visit: Payer: 59

## 2016-03-20 DIAGNOSIS — C50211 Malignant neoplasm of upper-inner quadrant of right female breast: Secondary | ICD-10-CM | POA: Diagnosis not present

## 2016-03-20 DIAGNOSIS — Z510 Encounter for antineoplastic radiation therapy: Secondary | ICD-10-CM | POA: Diagnosis not present

## 2016-03-20 DIAGNOSIS — Z170 Estrogen receptor positive status [ER+]: Secondary | ICD-10-CM | POA: Diagnosis not present

## 2016-03-23 ENCOUNTER — Ambulatory Visit: Payer: 59

## 2016-03-23 ENCOUNTER — Encounter: Payer: Self-pay | Admitting: Radiation Oncology

## 2016-03-23 ENCOUNTER — Ambulatory Visit
Admission: RE | Admit: 2016-03-23 | Discharge: 2016-03-23 | Disposition: A | Discharge status: Home / Self Care | Payer: 59 | Source: Ambulatory Visit | Attending: Radiation Oncology | Admitting: Radiation Oncology

## 2016-03-23 VITALS — BP 148/90 | HR 98 | Temp 98.7°F | Ht 66.5 in | Wt 238.2 lb

## 2016-03-23 DIAGNOSIS — Z510 Encounter for antineoplastic radiation therapy: Secondary | ICD-10-CM | POA: Diagnosis not present

## 2016-03-23 DIAGNOSIS — C50211 Malignant neoplasm of upper-inner quadrant of right female breast: Secondary | ICD-10-CM | POA: Insufficient documentation

## 2016-03-23 DIAGNOSIS — Z170 Estrogen receptor positive status [ER+]: Secondary | ICD-10-CM | POA: Diagnosis not present

## 2016-03-23 MED ORDER — RADIAPLEXRX EX GEL
Freq: Once | CUTANEOUS | Status: AC
  Administered 2016-03-23: 15:00:00 via TOPICAL

## 2016-03-23 NOTE — Progress Notes (Signed)
Debbie [name] is here for her 29th fraction to her Right Chest Wall. She reports soreness to her Right Chest. She has hyperpigmentation to her Right Chest. She does have peeling to her Axilla area and to the fold of skin to her lateral Chest area. She is using neosporin to these areas. She reports itching to her Right Chest. She is using radiaplex to her Right Chest. She knows she can use hydrocortisone ointment to the areas that itch. She was provided with another tube of radiaplex today.  BP (!) 148/90   Pulse 98   Temp 98.7 F (37.1 C)   Ht 5' 6.5" (1.689 m)   Wt 238 lb 3.2 oz (108 kg)   LMP 12/15/2015   SpO2 100% Comment: room air  BMI 37.87 kg/m    Wt Readings from Last 3 Encounters:  03/23/16 238 lb 3.2 oz (108 kg)  03/16/16 240 lb 6.4 oz (109 kg)  03/02/16 240 lb 3.2 oz (109 kg)

## 2016-03-23 NOTE — Addendum Note (Signed)
Encounter addended by: Ernst Spell, RN on: 03/23/2016  1:09 PM<BR>    Actions taken: Order list changed, Diagnosis association updated

## 2016-03-23 NOTE — Progress Notes (Signed)
   Weekly Management Note:  Outpatient    ICD-9-CM ICD-10-CM   1. Malignant neoplasm of upper-inner quadrant of right breast in female, estrogen receptor positive ([facility]CC) 174.2 C50.211    V86.0 Z17.0     Current Dose:  52.4 Gy  Projected Dose: 60.4 Gy   Narrative:  The patient presents for routine under treatment assessment.  CBCT/MVCT images/Port film x-rays were reviewed.  The chart was checked.  Doing well.  Skin dark, peeling over right chest wall. Working still.  Physical Findings:  height is 5' 6.5" (1.689 m) and weight is 238 lb 3.2 oz (108 kg). Her temperature is 98.7 F (37.1 C). Her blood pressure is 148/90 (abnormal) and her pulse is 98. Her oxygen saturation is 100%.   Wt Readings from Last 3 Encounters:  03/23/16 238 lb 3.2 oz (108 kg)  03/16/16 240 lb 6.4 oz (109 kg)  03/02/16 240 lb 3.2 oz (109 kg)   Marked  hyperpigmentation over right chest wall with a sizeable patch of moist peeling in right axilla and punctate moist peeling over chest wall. No infection   Impression:  The patient is tolerating radiotherapy.  Plan:  Continue radiotherapy as planned. Patient instructed to continue to apply Radiplex to intact skin in treatment fields. Apply neosporin instead to moist peeling    ________________________________   [name], M.D.

## 2016-03-23 NOTE — Addendum Note (Signed)
Encounter addended by: Malena Edman, RN on: 03/23/2016  2:50 PM<BR>    Actions taken: MAR administration accepted

## 2016-03-24 ENCOUNTER — Ambulatory Visit
Admission: RE | Admit: 2016-03-24 | Discharge: 2016-03-24 | Disposition: A | Discharge status: Home / Self Care | Payer: 59 | Source: Ambulatory Visit | Attending: Radiation Oncology | Admitting: Radiation Oncology

## 2016-03-24 DIAGNOSIS — Z170 Estrogen receptor positive status [ER+]: Secondary | ICD-10-CM | POA: Diagnosis not present

## 2016-03-24 DIAGNOSIS — Z510 Encounter for antineoplastic radiation therapy: Secondary | ICD-10-CM | POA: Diagnosis not present

## 2016-03-24 DIAGNOSIS — C50211 Malignant neoplasm of upper-inner quadrant of right female breast: Secondary | ICD-10-CM | POA: Diagnosis not present

## 2016-03-25 ENCOUNTER — Ambulatory Visit
Admission: RE | Admit: 2016-03-25 | Discharge: 2016-03-25 | Disposition: A | Discharge status: Home / Self Care | Payer: 59 | Source: Ambulatory Visit | Attending: Radiation Oncology | Admitting: Radiation Oncology

## 2016-03-25 DIAGNOSIS — C50211 Malignant neoplasm of upper-inner quadrant of right female breast: Secondary | ICD-10-CM | POA: Diagnosis not present

## 2016-03-25 DIAGNOSIS — Z170 Estrogen receptor positive status [ER+]: Secondary | ICD-10-CM | POA: Diagnosis not present

## 2016-03-25 DIAGNOSIS — Z510 Encounter for antineoplastic radiation therapy: Secondary | ICD-10-CM | POA: Diagnosis not present

## 2016-03-26 ENCOUNTER — Ambulatory Visit
Admission: RE | Admit: 2016-03-26 | Discharge: 2016-03-26 | Disposition: A | Discharge status: Home / Self Care | Payer: 59 | Source: Ambulatory Visit | Attending: Radiation Oncology | Admitting: Radiation Oncology

## 2016-03-26 ENCOUNTER — Ambulatory Visit: Payer: 59

## 2016-03-26 DIAGNOSIS — C50211 Malignant neoplasm of upper-inner quadrant of right female breast: Secondary | ICD-10-CM | POA: Diagnosis not present

## 2016-03-26 DIAGNOSIS — Z170 Estrogen receptor positive status [ER+]: Secondary | ICD-10-CM | POA: Diagnosis not present

## 2016-03-26 DIAGNOSIS — Z510 Encounter for antineoplastic radiation therapy: Secondary | ICD-10-CM | POA: Diagnosis not present

## 2016-03-27 ENCOUNTER — Ambulatory Visit
Admission: RE | Admit: 2016-03-27 | Discharge: 2016-03-27 | Disposition: A | Discharge status: Home / Self Care | Payer: 59 | Source: Ambulatory Visit | Attending: Radiation Oncology | Admitting: Radiation Oncology

## 2016-03-27 ENCOUNTER — Telehealth: Payer: Self-pay | Admitting: *Deleted

## 2016-03-27 ENCOUNTER — Ambulatory Visit: Payer: 59

## 2016-03-27 ENCOUNTER — Encounter: Payer: Self-pay | Admitting: Radiation Oncology

## 2016-03-27 DIAGNOSIS — Z170 Estrogen receptor positive status [ER+]: Secondary | ICD-10-CM

## 2016-03-27 DIAGNOSIS — C50211 Malignant neoplasm of upper-inner quadrant of right female breast: Secondary | ICD-10-CM

## 2016-03-27 DIAGNOSIS — Z510 Encounter for antineoplastic radiation therapy: Secondary | ICD-10-CM | POA: Diagnosis not present

## 2016-03-27 NOTE — Telephone Encounter (Signed)
  Oncology Nurse Navigator Documentation  Navigator Location: [facility] (03/27/16 1300)   )Navigator Encounter Type: Telephone (03/27/16 1300) Telephone: [place] Call (03/27/16 1300)     Surgery Date: 11/28/15 (03/27/16 1300)     Plastic Surgery Consult Date: 11/18/15 (03/27/16 1300) Multidisiplinary Clinic Date: 10/23/15 (03/27/16 1300) Multidisiplinary Clinic Type: Breast (03/27/16 1300) Treatment Initiated Date: 11/28/15 (03/27/16 1300) Patient Visit Type: [id number] (03/27/16 1300) Treatment Phase: Final Radiation Tx (03/27/16 1300) Barriers/Navigation Needs: No barriers at this time;No Questions;No Needs (03/27/16 1300)   Interventions: None required (03/27/16 1300)            Acuity: Level 1 (03/27/16 1300)         Time Spent with Patient: 15 (03/27/16 1300)

## 2016-03-27 NOTE — Progress Notes (Signed)
   Weekly Management Note:  Outpatient    ICD-9-CM ICD-10-CM   1. Malignant neoplasm of upper-inner quadrant of right breast in female, estrogen receptor positive (HCC) 174.2 C50.211    V86.0 Z17.0     Current Dose:  60.4 Gy  Projected Dose: 60.4 Gy   Narrative:  The patient presents for routine under treatment assessment.  CBCT/MVCT images/Port film x-rays were reviewed.  The chart was checked.   The patient is without complaint at this time.  Physical Findings:  vitals were not taken for this visit.  Wt Readings from Last 3 Encounters:  03/23/16 238 lb 3.2 oz (108 kg)  03/16/16 240 lb 6.4 oz (109 kg)  03/02/16 240 lb 3.2 oz (109 kg)   Dry desquamation and significant hyperpigmentation noted throughout the right axilla and outer chest wall.  Impression:  The patient has tolerated radiotherapy.  Plan:  The patient has completed scheduled course of radiotherapy. I advised the patient to continue applying neosporin to the peeling treatment area for the next week, and the patient can also continue using radiaplex >3weeks  until the skin feels less raw. Then try Vit E lotion.  We will schedule a one month follow up appointment with the patient.   ________________________________   [name], M.D.    This document serves as a record of services personally performed by [name], MD. It was created on her behalf by Maryla Morrow, a trained medical scribe. The creation of this record is based on the scribe's personal observations and the provider's statements to them. This document has been checked and approved by the attending provider.

## 2016-03-28 ENCOUNTER — Ambulatory Visit: Payer: 59

## 2016-03-30 ENCOUNTER — Telehealth: Payer: Self-pay | Admitting: *Deleted

## 2016-03-30 NOTE — Telephone Encounter (Signed)
CALLED PATIENT TO INFORM OF FU WITH DR. [name] ON 05-01-16, LVM FOR A RETURN CALL

## 2016-03-31 MED FILL — LOSARTAN POTASSIUM 100 MG T: 100 | 90 days supply | Qty: 90 | Fill #3

## 2016-03-31 MED FILL — LIVALO 2 MG TABLET: 2 | 30 days supply | Qty: 30 | Fill #4

## 2016-04-01 NOTE — Progress Notes (Signed)
  Radiation Oncology         [phone] ________________________________  Name: Debbie [name] MRN: [id number]  Date: 03/27/2016  DOB: [birth year]  End of Treatment Note  Diagnosis:  Stage IIB (pT2, pN1a) grade 1 IDC and intermediate grade DCIS of the right breast (ER/PR+, HER2-) and Stage IA (pT1c, pN0) grade 1 IDC, high grade DCIS, and LCIS of the left breast (ER/PR+, HER2-)  Indication for treatment: Curative   Radiation treatment dates:   02/11/16 - 03/27/16  Site/dose:   1) Right chest wall: 50.4 Gy in 28 fractions   2) Right chest wall boost: 10 Gy in 5 fractions  Beams/energy:   1) 3D // 10X, 6X Photon   2) En Face // 9 MeV Electron  Narrative: The patient tolerated radiation treatment relatively well. Halfway through treatment, the patient developed moderate hyperpigmentation over the right chest wall with small areas of early moist peeling in the right axilla and punctate moist peeling over the chest wall. She was instructed to apply neosporin to the areas of moist peeling. The small areas of moist peeling in the right axilla later became a sizeable patch during the last week of treatment. There was no sign of infection.  Plan: The patient has completed radiation treatment. The patient will return to radiation oncology clinic for routine followup in one month. I advised them to call or return sooner if they have any questions or concerns related to their recovery or treatment.  -----------------------------------  [name], MD  This document serves as a record of services personally performed by [name], MD. It was created on her behalf by Darcus Austin, a trained medical scribe. The creation of this record is based on the scribe's personal observations and the provider's statements to them. This document has been checked and approved by the attending provider.

## 2016-04-08 ENCOUNTER — Other Ambulatory Visit: Payer: Self-pay | Admitting: *Deleted

## 2016-04-08 DIAGNOSIS — C50211 Malignant neoplasm of upper-inner quadrant of right female breast: Secondary | ICD-10-CM

## 2016-04-08 DIAGNOSIS — Z170 Estrogen receptor positive status [ER+]: Secondary | ICD-10-CM

## 2016-04-08 NOTE — Progress Notes (Addendum)
[employer] representative Note  Diagnosis: Breast Cancer  Breast cancer of upper-inner quadrant of right female breast ([place]) 174.2 C50.211   OUTPATIENT  The patient's CT images from her initial simulation were reviewed to plan her boost treatment to her right  Chest wall scar.  Measurements were made on her CT regarding the  depth of the target. The boost will be delivered with 9 MeV electrons; 10 Gy in 5 fractions has been prescribed to the 94% isodose line.  1cm daily bolus.  A special port plan was reviewed and approved.  A custom electron cut-out will be used for her boost field.    -----------------------------------  [name], MD

## 2016-04-09 ENCOUNTER — Encounter: Payer: Self-pay | Admitting: Medical Oncology

## 2016-04-09 ENCOUNTER — Other Ambulatory Visit: Payer: 59

## 2016-04-09 ENCOUNTER — Ambulatory Visit (HOSPITAL_BASED_OUTPATIENT_CLINIC_OR_DEPARTMENT_OTHER): Payer: 59 | Admitting: Oncology

## 2016-04-09 VITALS — BP 187/95 | HR 111 | Temp 98.1°F | Resp 18 | Ht 66.5 in | Wt 244.1 lb

## 2016-04-09 DIAGNOSIS — Z79810 Long term (current) use of selective estrogen receptor modulators (SERMs): Secondary | ICD-10-CM

## 2016-04-09 DIAGNOSIS — C50211 Malignant neoplasm of upper-inner quadrant of right female breast: Secondary | ICD-10-CM | POA: Diagnosis not present

## 2016-04-09 DIAGNOSIS — Z170 Estrogen receptor positive status [ER+]: Secondary | ICD-10-CM | POA: Diagnosis not present

## 2016-04-09 DIAGNOSIS — C50911 Malignant neoplasm of unspecified site of right female breast: Secondary | ICD-10-CM

## 2016-04-09 DIAGNOSIS — C50112 Malignant neoplasm of central portion of left female breast: Secondary | ICD-10-CM

## 2016-04-09 DIAGNOSIS — C50912 Malignant neoplasm of unspecified site of left female breast: Secondary | ICD-10-CM | POA: Diagnosis not present

## 2016-04-09 MED ORDER — VITAMIN D (CHOLECALCIFEROL) 25 MCG (1000 UT) PO CAPS: 1.0000 | ORAL_CAPSULE | Freq: Every day | ORAL | 4 refills | Status: DC

## 2016-04-09 MED ORDER — CALCIUM-MAGNESIUM 250-125 MG PO TABS: 1.0000 | ORAL_TABLET | Freq: Every day | ORAL | 4 refills | Status: DC

## 2016-04-09 MED FILL — LEVOTHYROXINE 125 MCG TAB: 125 | 30 days supply | Qty: 30 | Fill #2

## 2016-04-09 MED FILL — METFORMIN HCL ER 750 MG TAB: 750 | 30 days supply | Qty: 30 | Fill #6

## 2016-04-09 NOTE — Progress Notes (Signed)
[facility]  [phone] [phone]     ID: Debbie [name] DOB: March 28, 1966  MR#: [id number]  KMQ#:[id number]  Patient Care Team: Kelton Pillar, MD as PCP - General (Family Medicine) Jacelyn Pi, MD as Consulting Physician (Endocrinology) Eldred Manges, MD as Consulting Physician (Obstetrics and Gynecology) Autumn Messing III, MD as Consulting Physician (General Surgery) Chauncey Cruel, MD as Consulting Physician (Oncology) [name], MD as Attending Physician (Radiation Oncology) Belva Crome, MD as Consulting Physician (Cardiology) Crissie Reese, MD as Consulting Physician (Plastic Surgery) Maxwell Marion, RN as Registered Nurse (Medical Oncology) PCP: Osborne Casco, MD OTHER MD:  CHIEF COMPLAINT: Estrogen receptor positive breast cancer  CURRENT TREATMENT: tamoxifen   BREAST CANCER HISTORY:  from the original intake note:  Debbie [name] had screening mammography with tomosynthesis at the [facility] 10/02/2015. This shows 2 possible masses in the right breast as well as some suspicious calcifications. The left breast was unremarkable. On 10/15/2015 she underwent right diagnostic mammography with tomography and right breast ultrasonography. The breast density was category B. Spot magnification views confirmed an area of pleomorphic calcifications measuring up to 5 cm in the upper half of the breast. Also in the upper outer right breast there were adjacent low-density masses without distortion or calcifications. On physical exam there was no palpable abnormality.  Targeted ultrasound of the right breast found the 2 "masses" in the right upper quadrant were benign cysts measuring 0.8 and 0.5 cm respectively. Biopsy of the area of calcification on 10/15/2015 showed ([id number]) invasive ductal carcinoma, E-cadherin positive, grade 2, estrogen receptor 90% positive, progesterone receptor 50% positive, both with strong staining  intensity, with an MIB-1 of 70%, and no HER-2 amplification, the signals ratio being 1.18 and the number per cell 2.60.  Her subsequent history is as detailed below    INTERVAL HISTORY: Debbie [name] returns today for follow-up of he bilateral breast cancers. Our research nurse Otilio Miu also was present during today's visit  After her last visit here [facility] underwent bilateral mastectomies, 12/26/2015. Final pathology ([id number]) showed, on the right, no residual carcinoma, and all 3 sentinel lymph nodes clear. In the left there was only a 0.2 cm area of focal ductal carcinoma in situ. Margins were clear. A total of 4 lymph nodes were removed from the left and they were all benign.  Once she recovered from the surgery she proceeded to radiation. She completed this 2 weeks ago. She did generally well with radiation, although she had some peeling and hyperpigmentation. She continued to work a right through the treatments.  Debbie [name] continues on tamoxifen, which she never interrupted. She tolerates it well, with some hot flashes which are she says decreased, no problems regarding vaginal wetness or discharge. She obtains it at less than $5 per month.   She is here today to discuss anti-estrogen and the PALLAS study.   REVIEW OF SYSTEMS: Debbie [name] has some sinus symptoms, which she treats with Zyrtec. Her dentures don't fit well. She is on calcium which tends to constipate her. Otherwise a detailed review of systems today was noncontributory   PAST MEDICAL HISTORY: Past Medical History:  Diagnosis Date  . Allergy   . Anxiety   . Breast cancer of upper-inner quadrant of right female breast ([place]) 10/17/2015  . Depression    no longer on medication off meds since Feb 2017  . Dysrhythmia   . Elevated cholesterol   . Family history of adverse reaction to anesthesia  mother has Copd was kept on vent   . Headache   . Heart murmur   . Hypertension   . Hypothyroidism   . Menorrhagia 2011  .  Urinary incontinence 2010    PAST SURGICAL HISTORY: Past Surgical History:  Procedure Laterality Date  . BREAST LUMPECTOMY Right 11/28/2015   RIGHT BREAST LUMPECTOMY WITH NEEDLE LOCALIZATION AND AXILLARY SENTINEL LYMPH NODE BX (Right)  . BREAST LUMPECTOMY WITH NEEDLE LOCALIZATION AND AXILLARY SENTINEL LYMPH NODE BX Right 11/28/2015   Procedure: RIGHT BREAST LUMPECTOMY WITH NEEDLE LOCALIZATION AND AXILLARY SENTINEL LYMPH NODE BX;  Surgeon: Autumn Messing III, MD;  Location: [facility];  Service: General;  Laterality: Right;  . BREAST REDUCTION SURGERY Bilateral 11/28/2015   Procedure: MAMMARY REDUCTION  (BREAST) BILATERAL;  Surgeon: Crissie Reese, MD;  Location: [facility];  Service: Plastics;  Laterality: Bilateral;  . CESAREAN SECTION    . COLONOSCOPY    . DENTAL SURGERY     2005,2013  . MASTECTOMY W/ SENTINEL NODE BIOPSY Bilateral 12/26/2015  . MASTECTOMY W/ SENTINEL NODE BIOPSY Bilateral 12/26/2015   Procedure: BILATERAL MASTECTOMY WITH LEFT SENTINEL LYMPH NODE BIOPSY;  Surgeon: Autumn Messing III, MD;  Location: [place];  Service: General;  Laterality: Bilateral;  . TUBAL LIGATION      FAMILY HISTORY Family History  Problem Relation Age of Onset  . Diabetes Mother   . Hypertension Mother   . Hypertension Father   . Diabetes Maternal Grandmother   . Hypertension Sister   . Cancer Paternal Grandmother     colon  . Colon cancer Paternal Grandmother   . Esophageal cancer Neg Hx   . Stomach cancer Neg Hx   . Rectal cancer Neg Hx   The patient's parents are still living, in their early 2s as of June 2017. The patient has one brother, 2 sisters. On the maternal side there is a history of uterine and prostate cancer. On the paternal side there is a history of colon cancer and possibly uterine cancer. There is no history of breast or ovarian cancer in the family.   GYNECOLOGIC HISTORY:  No LMP recorded.  Menarche age 47. She still having regular periods. She is GX P1, first pregnancy age 12. She used oral  contraceptives for a few years remotely, with no complications. She is currently on a Mirena IUD and also is status post bilateral tubal ligation.  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 Her husband Debbie [name] is a Administrator. Their son Debbie [name] is a radio DJ. The patient has no grandchildren. She is not a Ambulance person.    ADVANCED DIRECTIVES: Not in place   HEALTH MAINTENANCE: Social History  Substance Use Topics  . Smoking status: Never Smoker  . Smokeless tobacco: Never Used  . Alcohol use No     Colonoscopy: January 2016   PAP: January 2016   Bone density: Never   Lipid panel:  Allergies  Allergen Reactions  . Ace Inhibitors Cough  . Atorvastatin Other (See Comments)    Joint pain  . Simvastatin Other (See Comments)    Joint pain    Current Outpatient Prescriptions  Medication Sig Dispense Refill  . acetaminophen (TYLENOL) 325 MG tablet Take 650 mg by mouth every 6 (six) hours as needed for mild pain. Reported on 10/23/2015    . Calcium-Magnesium 250-125 MG TABS Take 1 tablet by mouth daily. 120 each 4  . Cetirizine HCl (ZYRTEC ALLERGY PO) Take 1 tablet by mouth daily  as needed (allergies).     . hydrochlorothiazide (MICROZIDE) 12.5 MG capsule Take 12.5 mg by mouth daily.    Marland Kitchen levonorgestrel (MIRENA) 20 MCG/24HR IUD 1 each by Intrauterine route once.    Marland Kitchen levothyroxine (SYNTHROID, LEVOTHROID) 125 MCG tablet Take 125 mcg by mouth daily.    Marland Kitchen LIVALO 2 MG TABS Take 0.2 mg by mouth daily.   6  . losartan (COZAAR) 100 MG tablet Take 100 mg by mouth daily.    . metFORMIN (GLUCOPHAGE-XR) 750 MG 24 hr tablet Take 750 mg by mouth every evening.   6  . MULTIPLE MINERALS PO Take by mouth.    . omega-3 acid ethyl esters (LOVAZA) 1 g capsule Take 1 g by mouth 2 (two) times daily.    . potassium chloride SA (K-DUR,KLOR-CON) 20 MEQ tablet Take 40 mEq by mouth 2 (two) times daily.     . tamoxifen (NOLVADEX) 20 MG tablet Take 20 mg by mouth daily.      . Vitamin D, Cholecalciferol, 1000 units CAPS Take 1 tablet by mouth daily. 90 capsule 4   No current facility-administered medications for this visit.     OBJECTIVE: Middle-aged African-American woman  Who appears stated age 50:   04/09/16 1456  BP: (!) 187/95  Pulse: (!) 111  Resp: 18  Temp: 98.1 F (36.7 C)     Body mass index is 38.81 kg/m.    ECOG FS:0 - Asymptomatic  Sclerae unicteric, pupils round and equal Oropharynx clear and moist-- no thrush or other lesions No cervical or supraclavicular adenopathy Lungs no rales or rhonchi Heart regular rate and rhythm Abd soft, nontender, positive bowel sounds MSK no focal spinal tenderness, no upper extremity lymphedema Neuro: nonfocal, well oriented, appropriate affect Breasts: Status post bilateral mastectomies. On the right in addition she received adjuvant radiation. On the right there is hyperpigmentation over the radiation port area, but no desquamation. There is no evidence of chest wall recurrence on either side. Both axillae are benign.  LAB RESULTS:  CMP     Component Value Date/Time   NA 138 12/23/2015 0838   NA 140 10/23/2015 1228   K 4.1 12/23/2015 0838   K 3.8 10/23/2015 1228   CL 105 12/23/2015 0838   CO2 23 12/23/2015 0838   CO2 27 10/23/2015 1228   GLUCOSE 93 12/23/2015 0838   GLUCOSE 96 10/23/2015 1228   BUN 7 12/23/2015 0838   BUN 9.9 10/23/2015 1228   CREATININE 0.82 12/23/2015 0838   CREATININE 1.0 10/23/2015 1228   CALCIUM 9.5 12/23/2015 0838   CALCIUM 9.9 10/23/2015 1228   PROT 7.9 10/23/2015 1228   ALBUMIN 3.7 10/23/2015 1228   AST 16 10/23/2015 1228   ALT 19 10/23/2015 1228   ALKPHOS 142 10/23/2015 1228   BILITOT 0.46 10/23/2015 1228   GFRNONAA >60 12/23/2015 0838   GFRAA >60 12/23/2015 0838    INo results found for: SPEP, UPEP  Lab Results  Component Value Date   WBC 3.7 (L) 12/23/2015   NEUTROABS 4.7 10/23/2015   HGB 12.2 12/23/2015   HCT 39.6 12/23/2015   MCV 90.8  12/23/2015   PLT 256 12/23/2015      Chemistry      Component Value Date/Time   NA 138 12/23/2015 0838   NA 140 10/23/2015 1228   K 4.1 12/23/2015 0838   K 3.8 10/23/2015 1228   CL 105 12/23/2015 0838   CO2 23 12/23/2015 0838   CO2 27 10/23/2015 1228   BUN  7 12/23/2015 0838   BUN 9.9 10/23/2015 1228   CREATININE 0.82 12/23/2015 0838   CREATININE 1.0 10/23/2015 1228      Component Value Date/Time   CALCIUM 9.5 12/23/2015 0838   CALCIUM 9.9 10/23/2015 1228   ALKPHOS 142 10/23/2015 1228   AST 16 10/23/2015 1228   ALT 19 10/23/2015 1228   BILITOT 0.46 10/23/2015 1228       No results found for: LABCA2  No components found for: LABCA125  No results for input(s): INR in the last 168 hours.  Urinalysis No results found for: COLORURINE, APPEARANCEUR, LABSPEC, PHURINE, GLUCOSEU, HGBUR, BILIRUBINUR, KETONESUR, PROTEINUR, UROBILINOGEN, NITRITE, LEUKOCYTESUR   STUDIES: No results found.  ELIGIBLE FOR AVAILABLE RESEARCH PROTOCOL: PALLAS  ASSESSMENT: 50 y.o. [place] woman status post right breast upper inner quadrant biopsy 10/15/2015 for a clinical T2-T3 N0, stage II invasive ductal carcinoma, estrogen and progesterone receptor positive, HER-2 nonamplified, with an Mib-1 of 70%  (1) status post right lumpectomy and sentinel lymph node sampling with oncoplastic breast reduction 11/28/2015 for a pT2 pN1, stage IIB invasive ductal carcinoma, grade 1, with negative margins (1/1 sentinel node positive)  (2) left reduction mammoplasty 11/28/2015 unexpectedly found a pT1c pNX invasive ductal carcinoma, grade 1, estrogen and progesterone receptor positive, HER-2 not amplified, with an MIB-1 of 3%  (3) Mammaprint from the right sided breast cancer was "low risk", predicting a 98% chance of no disease recurrence within 5 years with anti-estrogens as the only systemic therapy. It also predicts minimal to no benefit from chemotherapy  (4) status post bilateral mastectomies with  bilateral sentinel lymph node sampling 12/26/2015, showing  (a) on the right, no residual carcinoma (0/3 nodes involved)  (b) on the left, focal ductal carcinoma in situ, 0.2 cm, with negative margins; (0/4 nodes involved)  (5) adjuvant radiation 02/11/16 - 03/27/16    1) Right chest wall: 50.4 Gy in 28 fractions               2) Right chest wall boost: 10 Gy in 5 fractions   (6) started on tamoxifen neoadjuvantly 10/23/2015 to allow for expected delays in definitive local treatment  (7) considering PALLAS trial  (8) since she had 4 lymph nodes removed from each axilla but also receive radiation on the right, lab draws should preferentially be obtained from the left arm   PLAN: Debbie [name] has completed local treatment for her breast cancer and is tolerating tamoxifen well. The plan will be to continue that a minimum of 3 years and then consider switching to an aromatase inhibitor versus continuing tamoxifen for a total of 10 years.  I think she would be a good candidate for the PALLAS trial. We discussed cycling inhibitors today and she understands the keep cells from making more cells which is of course what cancer cells are constantly trying to do. We discussed the possible toxicities side effects and complications of these agents and a very detailed information package was also discussed with the patient and provided to her IR research nurse.  Debbie [name] is very interested in participating. We have approximately 2 weeks to enroll her since this study requires her to be enrolled within 6 months of starting anti-estrogens. I would anticipate no complications from that and if there are any specific studies to be obtained beyond lab work these can be ordered by the research nurse at this point  Otherwise I will plan to see Debbie [name] back in about 3 months. She knows to call for any problems that may  develop before her next visit here    Chauncey Cruel, MD   04/09/2016 3:37 PM Medical Oncology and  Hematology [facility] [address] [place], [place] [address] Tel. [phone]    Fax. [phone]

## 2016-04-11 ENCOUNTER — Other Ambulatory Visit: Payer: Self-pay | Admitting: Oncology

## 2016-04-14 ENCOUNTER — Other Ambulatory Visit: Payer: Self-pay | Admitting: Oncology

## 2016-04-14 NOTE — Telephone Encounter (Signed)
Opened in error

## 2016-04-21 MED FILL — TAMOXIFEN 20 MG TABLET: 20 | 90 days supply | Qty: 90 | Fill #2

## 2016-04-22 ENCOUNTER — Encounter: Payer: Self-pay | Admitting: *Deleted

## 2016-04-22 MED FILL — KLOR-CON M20 TABLET: 20 | 90 days supply | Qty: 180 | Fill #8

## 2016-04-22 MED FILL — HYDROCHLOROTHIAZIDE 12.5 MG: 12.5 | 90 days supply | Qty: 90 | Fill #3

## 2016-04-28 MED FILL — LIVALO 2 MG TABLET: 2 | 90 days supply | Qty: 90 | Fill #0

## 2016-04-29 ENCOUNTER — Encounter: Payer: Self-pay | Admitting: Radiation Oncology

## 2016-04-29 MED FILL — METFORMIN HCL ER 750 MG TAB: 750 | 90 days supply | Qty: 90 | Fill #0

## 2016-04-29 MED FILL — LEVOTHYROXINE 125 MCG TABLE: 125 | 90 days supply | Qty: 90 | Fill #3

## 2016-05-01 ENCOUNTER — Ambulatory Visit
Admission: RE | Admit: 2016-05-01 | Discharge: 2016-05-01 | Disposition: A | Discharge status: Home / Self Care | Payer: 59 | Source: Ambulatory Visit | Attending: Radiation Oncology | Admitting: Radiation Oncology

## 2016-05-01 ENCOUNTER — Encounter: Payer: Self-pay | Admitting: Radiation Oncology

## 2016-05-01 VITALS — BP 142/86 | HR 97 | Temp 98.2°F | Ht 66.5 in | Wt 243.8 lb

## 2016-05-01 DIAGNOSIS — C50912 Malignant neoplasm of unspecified site of left female breast: Secondary | ICD-10-CM

## 2016-05-01 DIAGNOSIS — C50911 Malignant neoplasm of unspecified site of right female breast: Secondary | ICD-10-CM

## 2016-05-01 DIAGNOSIS — Z170 Estrogen receptor positive status [ER+]: Secondary | ICD-10-CM | POA: Diagnosis not present

## 2016-05-01 DIAGNOSIS — C50112 Malignant neoplasm of central portion of left female breast: Secondary | ICD-10-CM | POA: Insufficient documentation

## 2016-05-01 DIAGNOSIS — C50211 Malignant neoplasm of upper-inner quadrant of right female breast: Secondary | ICD-10-CM | POA: Diagnosis not present

## 2016-05-01 HISTORY — DX: Personal history of irradiation: Z92.3

## 2016-05-01 NOTE — Progress Notes (Signed)
Debbie [name] presents for follow up of radiation completed 03/27/16 to her Right Breast. She denies pain at this time. She does report occasional "throbbing" to the lateral area of her bilateral Breasts. She does have some fatigue. She is trying to catch up from her experiences over the past several months. Her Right Chest has healed. She still has hyperpigmentation to the Right Chest Wall. She does report an occasional flare up to the center area of her Right Chest. She will alternate between using neosporin and Radiaplex to this area. She also reports itching to this area. She has been taking Tamoxifen since 10/23/15 and reports occasional hot flashes.   BP (!) 142/86   Pulse 97   Temp 98.2 F (36.8 C)   Ht 5' 6.5" (1.689 m)   Wt 243 lb 12.8 oz (110.6 kg)   SpO2 98% Comment: room air  BMI 38.76 kg/m    Wt Readings from Last 3 Encounters:  05/01/16 243 lb 12.8 oz (110.6 kg)  04/09/16 244 lb 1.6 oz (110.7 kg)  03/23/16 238 lb 3.2 oz (108 kg)

## 2016-05-01 NOTE — Progress Notes (Signed)
Radiation Oncology         [phone]) [phone] ________________________________  Name: Debbie [name] MRN: [id number]  Date: 05/01/2016  DOB: [birth year]  Follow-Up Visit Note  Outpatient  CC: Osborne Casco, MD  Jovita Kussmaul, MD  Diagnosis and Prior Radiotherapy:     ICD-9-CM ICD-10-CM   1. Malignant neoplasm of upper-inner quadrant of right breast in female, estrogen receptor positive ([place]) 174.2 C50.211    V86.0 Z17.0   2. Bilateral malignant neoplasm of breast in female, estrogen receptor positive, unspecified site of breast ([facility]) 174.9 C50.911    V86.0 Z17.0     C50.912   3. Cancer of central portion of left breast ([facility]) 174.1 C50.112     Stage IIB (pT2, pN1a) grade 1 IDC and intermediate grade DCIS of the right breast (ER/PR+, HER2-) and Stage IA (pT1c, pN0) grade 1 IDC, high grade DCIS, and LCIS of the left breast (ER/PR+, HER2-)  Radiation treatment dates:   02/11/16 - 03/27/16  Site/dose/beams/energy:    1) Right chest wall: 50.4 Gy in 28 fractions ; 3D // 10X, 6X Photon 2) Right chest wall boost: 10 Gy in 5 fractions ; En Face // 9 MeV Electron  CHIEF COMPLAINT: Here for follow-up and surveillance of right and left breast cancer  Narrative:  The patient returns today for routine follow-up of radiation completed 03/27/16 to her Right Breast. She is doing well overall. She denies pain at this time. She does report occasional "throbbing" to the lateral area of her bilateral Breasts. She does have some fatigue. She is trying to catch up from her experiences over the past several months. Her Right Chest has healed. She still has hyperpigmentation to the Right Chest Wall. She does report an occasional flare up of dry skin to the center area of her Right Chest. She will alternate between using neosporin and Radiaplex to this area. She also reports itching to this area. She has been taking Tamoxifen since 10/23/15 and reports occasional hot flashes.        ALLERGIES:  is allergic to ace inhibitors; atorvastatin; and simvastatin.  Meds: Current Outpatient Prescriptions  Medication Sig Dispense Refill  . acetaminophen (TYLENOL) 325 MG tablet Take 650 mg by mouth every 6 (six) hours as needed for mild pain. Reported on 10/23/2015    . Calcium-Magnesium 250-125 MG TABS Take 1 tablet by mouth daily. 120 each 4  . Cetirizine HCl (ZYRTEC ALLERGY PO) Take 1 tablet by mouth daily as needed (allergies).     . hydrochlorothiazide (MICROZIDE) 12.5 MG capsule Take 12.5 mg by mouth daily.    Marland Kitchen levonorgestrel (MIRENA) 20 MCG/24HR IUD 1 each by Intrauterine route once.    Marland Kitchen levothyroxine (SYNTHROID, LEVOTHROID) 125 MCG tablet Take 125 mcg by mouth daily.    Marland Kitchen LIVALO 2 MG TABS Take 0.2 mg by mouth daily.   6  . losartan (COZAAR) 100 MG tablet Take 100 mg by mouth daily.    . metFORMIN (GLUCOPHAGE-XR) 750 MG 24 hr tablet Take 750 mg by mouth every evening.   6  . MULTIPLE MINERALS PO Take by mouth.    . omega-3 acid ethyl esters (LOVAZA) 1 g capsule Take 1 g by mouth 2 (two) times daily.    . potassium chloride SA (K-DUR,KLOR-CON) 20 MEQ tablet Take 40 mEq by mouth 2 (two) times daily.     . tamoxifen (NOLVADEX) 20 MG tablet Take 20 mg by mouth daily.    . Vitamin D, Cholecalciferol, 1000 units CAPS  Take 1 tablet by mouth daily. 90 capsule 4   No current facility-administered medications for this encounter.     Physical Findings:  height is 5' 6.5" (1.689 m) and weight is 243 lb 12.8 oz (110.6 kg). Her temperature is 98.2 F (36.8 C). Her blood pressure is 142/86 (abnormal) and her pulse is 97. Her oxygen saturation is 98%. .    General: Alert and oriented, in no acute distress HEENT: Head is normocephalic.  Extremities: No cyanosis or edema. Neurologic: Speech is fluent.   Psychiatric: Judgment and insight are intact. Affect is appropriate. Breast: She has residual hyperpigmentation over the right chest wall with erythema in areas of new skin that  have regenerated along the chest wall scar. Skin is intact without any active peeling.  Lab Findings: Lab Results  Component Value Date   WBC 3.7 (L) 12/23/2015   HGB 12.2 12/23/2015   HCT 39.6 12/23/2015   MCV 90.8 12/23/2015   PLT 256 12/23/2015    Radiographic Findings: No results found.  Impression/Plan:  Stage IIB (pT2, pN1a) grade 1 IDC and intermediate grade DCIS of the right breast (ER/PR+, HER2-) and Stage IA (pT1c, pN0) grade 1 IDC, high grade DCIS, and LCIS of the left breast (ER/PR+, HER2-)  I encouraged her to continue with   regular self  examinations, and followup with medical oncology. I encouraged the patient to continue using radiaplex until she runs out and then switch to Vitamin E oil/lotion to promote further healing in a few weeks if her skin does not feel irritated by then.  Cleanse gently.  I will see her back on an as-needed basis. I have encouraged her to call if she has any issues or concerns in the future. I wished her the very best.     [name], MD   This document serves as a record of services personally performed by [name], MD. It was created on her behalf by Arlyce Harman, a trained medical scribe. The creation of this record is based on the scribe's personal observations and the provider's statements to them. This document has been checked and approved by the attending provider.

## 2016-05-15 DIAGNOSIS — F39 Unspecified mood [affective] disorder: Secondary | ICD-10-CM | POA: Diagnosis not present

## 2016-05-15 DIAGNOSIS — E669 Obesity, unspecified: Secondary | ICD-10-CM | POA: Diagnosis not present

## 2016-05-15 DIAGNOSIS — D0510 Intraductal carcinoma in situ of unspecified breast: Secondary | ICD-10-CM | POA: Diagnosis not present

## 2016-05-15 DIAGNOSIS — I10 Essential (primary) hypertension: Secondary | ICD-10-CM | POA: Diagnosis not present

## 2016-05-15 DIAGNOSIS — E7800 Pure hypercholesterolemia, unspecified: Secondary | ICD-10-CM | POA: Diagnosis not present

## 2016-05-15 DIAGNOSIS — E039 Hypothyroidism, unspecified: Secondary | ICD-10-CM | POA: Diagnosis not present

## 2016-05-15 DIAGNOSIS — Z0000 Encounter for general adult medical examination without abnormal findings: Secondary | ICD-10-CM | POA: Diagnosis not present

## 2016-05-15 DIAGNOSIS — N951 Menopausal and female climacteric states: Secondary | ICD-10-CM | POA: Diagnosis not present

## 2016-05-21 DIAGNOSIS — R140 Abdominal distension (gaseous): Secondary | ICD-10-CM | POA: Diagnosis not present

## 2016-05-21 DIAGNOSIS — Z01419 Encounter for gynecological examination (general) (routine) without abnormal findings: Secondary | ICD-10-CM | POA: Diagnosis not present

## 2016-05-21 DIAGNOSIS — Z9013 Acquired absence of bilateral breasts and nipples: Secondary | ICD-10-CM | POA: Diagnosis not present

## 2016-05-21 DIAGNOSIS — C50919 Malignant neoplasm of unspecified site of unspecified female breast: Secondary | ICD-10-CM | POA: Diagnosis not present

## 2016-05-21 DIAGNOSIS — Z6839 Body mass index (BMI) 39.0-39.9, adult: Secondary | ICD-10-CM | POA: Diagnosis not present

## 2016-05-21 DIAGNOSIS — Z975 Presence of (intrauterine) contraceptive device: Secondary | ICD-10-CM | POA: Diagnosis not present

## 2016-06-05 DIAGNOSIS — C50912 Malignant neoplasm of unspecified site of left female breast: Secondary | ICD-10-CM | POA: Diagnosis not present

## 2016-06-05 DIAGNOSIS — C50911 Malignant neoplasm of unspecified site of right female breast: Secondary | ICD-10-CM | POA: Diagnosis not present

## 2016-06-08 MED FILL — LEVOTHYROXINE 137 MCG TAB: 137 | 90 days supply | Qty: 90 | Fill #0

## 2016-07-07 MED FILL — LOSARTAN POTASSIUM 100 MG T: 100 | 90 days supply | Qty: 90 | Fill #0

## 2016-07-08 ENCOUNTER — Other Ambulatory Visit (HOSPITAL_BASED_OUTPATIENT_CLINIC_OR_DEPARTMENT_OTHER): Payer: 59

## 2016-07-08 ENCOUNTER — Ambulatory Visit (HOSPITAL_BASED_OUTPATIENT_CLINIC_OR_DEPARTMENT_OTHER): Payer: 59 | Admitting: Oncology

## 2016-07-08 ENCOUNTER — Other Ambulatory Visit: Payer: Self-pay | Admitting: Adult Health

## 2016-07-08 VITALS — BP 160/75 | HR 85 | Temp 98.2°F | Resp 18 | Ht 66.5 in | Wt 245.0 lb

## 2016-07-08 DIAGNOSIS — Z170 Estrogen receptor positive status [ER+]: Secondary | ICD-10-CM

## 2016-07-08 DIAGNOSIS — C50911 Malignant neoplasm of unspecified site of right female breast: Secondary | ICD-10-CM

## 2016-07-08 DIAGNOSIS — C50211 Malignant neoplasm of upper-inner quadrant of right female breast: Secondary | ICD-10-CM

## 2016-07-08 DIAGNOSIS — D0512 Intraductal carcinoma in situ of left breast: Secondary | ICD-10-CM

## 2016-07-08 DIAGNOSIS — C50112 Malignant neoplasm of central portion of left female breast: Secondary | ICD-10-CM

## 2016-07-08 DIAGNOSIS — C50912 Malignant neoplasm of unspecified site of left female breast: Secondary | ICD-10-CM

## 2016-07-08 DIAGNOSIS — Z79810 Long term (current) use of selective estrogen receptor modulators (SERMs): Secondary | ICD-10-CM

## 2016-07-08 LAB — CBC WITH DIFFERENTIAL/PLATELET
BASO%: 0.2 % (ref 0.0–2.0)
Basophils Absolute: 0 10*3/uL (ref 0.0–0.1)
EOS%: 2.3 % (ref 0.0–7.0)
Eosinophils Absolute: 0.1 10*3/uL (ref 0.0–0.5)
HCT: 39.9 % (ref 34.8–46.6)
HGB: 13.2 g/dL (ref 11.6–15.9)
LYMPH%: 18.9 % (ref 14.0–49.7)
MCH: 29.1 pg (ref 25.1–34.0)
MCHC: 33.1 g/dL (ref 31.5–36.0)
MCV: 88.1 fL (ref 79.5–101.0)
MONO#: 0.4 10*3/uL (ref 0.1–0.9)
MONO%: 7.1 % (ref 0.0–14.0)
NEUT#: 4.1 10*3/uL (ref 1.5–6.5)
NEUT%: 71.5 % (ref 38.4–76.8)
Platelets: 276 10*3/uL (ref 145–400)
RBC: 4.53 10*6/uL (ref 3.70–5.45)
RDW: 14.4 % (ref 11.2–14.5)
WBC: 5.8 10*3/uL (ref 3.9–10.3)
lymph#: 1.1 10*3/uL (ref 0.9–3.3)

## 2016-07-08 LAB — COMPREHENSIVE METABOLIC PANEL
ALT: 27 U/L (ref 0–55)
AST: 22 U/L (ref 5–34)
Albumin: 3.6 g/dL (ref 3.5–5.0)
Alkaline Phosphatase: 115 U/L (ref 40–150)
Anion Gap: 10 mEq/L (ref 3–11)
BUN: 11.9 mg/dL (ref 7.0–26.0)
CO2: 24 mEq/L (ref 22–29)
Calcium: 10 mg/dL (ref 8.4–10.4)
Chloride: 106 mEq/L (ref 98–109)
Creatinine: 0.9 mg/dL (ref 0.6–1.1)
EGFR: 88 mL/min/{1.73_m2} — ABNORMAL LOW (ref >90)
Glucose: 89 mg/dl (ref 70–140)
Potassium: 4.1 mEq/L (ref 3.5–5.1)
Sodium: 140 mEq/L (ref 136–145)
Total Bilirubin: 0.35 mg/dL (ref 0.20–1.20)
Total Protein: 7.6 g/dL (ref 6.4–8.3)

## 2016-07-08 NOTE — Progress Notes (Signed)
Debbie [name]  Telephone:([phone]) [phone] Fax:(336) [phone]     ID: Debbie [name] DOB: 1966/02/06  MR#: [id number]  TML#:[id number]  Patient Care Team: Kelton Pillar, MD as PCP - General (Family Medicine) Jacelyn Pi, MD as Consulting Physician (Endocrinology) Eldred Manges, MD as Consulting Physician (Obstetrics and Gynecology) Autumn Messing III, MD as Consulting Physician (General Surgery) Chauncey Cruel, MD as Consulting Physician (Oncology) [name], MD as Attending Physician (Radiation Oncology) Belva Crome, MD as Consulting Physician (Cardiology) Crissie Reese, MD as Consulting Physician (Plastic Surgery) Maxwell Marion, RN as Registered Nurse (Medical Oncology) PCP: Osborne Casco, MD OTHER MD:  CHIEF COMPLAINT: Estrogen receptor positive breast cancer  CURRENT TREATMENT: tamoxifen   BREAST CANCER HISTORY:  from the original intake note:  Teana had screening mammography with tomosynthesis at the [facility] 10/02/2015. This shows 2 possible masses in the right breast as well as some suspicious calcifications. The left breast was unremarkable. On 10/15/2015 she underwent right diagnostic mammography with tomography and right breast ultrasonography. The breast density was category B. Spot magnification views confirmed an area of pleomorphic calcifications measuring up to 5 cm in the upper half of the breast. Also in the upper outer right breast there were adjacent low-density masses without distortion or calcifications. On physical exam there was no palpable abnormality.  Targeted ultrasound of the right breast found the 2 "masses" in the right upper quadrant were benign cysts measuring 0.8 and 0.5 cm respectively. Biopsy of the area of calcification on 10/15/2015 showed ([id number]) invasive ductal carcinoma, E-cadherin positive, grade 2, estrogen receptor 90% positive, progesterone receptor 50% positive, both with strong staining  intensity, with an MIB-1 of 70%, and no HER-2 amplification, the signals ratio being 1.18 and the number per cell 2.60.  Her subsequent history is as detailed below    INTERVAL HISTORY: Debbie [name] returns today for follow-up of her estrogen receptor positive breast cancer. She continues on tamoxifen, with excellent tolerance. She has slight Debbie [name] increased hot flashes but nothing she can't deal with, she says. She has a Mirena in place and that takes care of a lot of the vaginal wetness problems she might otherwise be experiencing from tamoxifen. She obtains a drug at a good price.  Since her last visit here she completed her radiation treatments. She tolerated them well. She still has a sensation of fullness and tightness at the same time in the chest which many of my patients bilateral mastectomies 6.  Also since her last visit here the prognostic staging system for breast cancer has been revised and her particular case has been down staged to IIA instead of IIB.  REVIEW OF SYSTEMS: She is back to work full-time. A detailed review of systems today was otherwise stable.  PAST MEDICAL HISTORY: Past Medical History:  Diagnosis Date  . Allergy   . Anxiety   . Breast cancer of upper-inner quadrant of right female breast ([place]) 10/17/2015  . Depression    no longer on medication off meds since Feb 2017  . Dysrhythmia   . Elevated cholesterol   . Family history of adverse reaction to anesthesia     mother has Copd was kept on vent   . Headache   . Heart murmur   . History of radiation therapy 02/11/16- 03/27/16   Right Chest Wall 50.4 Gy in 28 fractions, Right Chest Wall Boost 10 Gy in 5 fractions.   . Hypertension   . Hypothyroidism   . Menorrhagia 2011  .  Urinary incontinence 2010    PAST SURGICAL HISTORY: Past Surgical History:  Procedure Laterality Date  . BREAST LUMPECTOMY Right 11/28/2015   RIGHT BREAST LUMPECTOMY WITH NEEDLE LOCALIZATION AND AXILLARY SENTINEL LYMPH NODE BX (Right)    . BREAST LUMPECTOMY WITH NEEDLE LOCALIZATION AND AXILLARY SENTINEL LYMPH NODE BX Right 11/28/2015   Procedure: RIGHT BREAST LUMPECTOMY WITH NEEDLE LOCALIZATION AND AXILLARY SENTINEL LYMPH NODE BX;  Surgeon: Autumn Messing III, MD;  Location: [facility];  Service: General;  Laterality: Right;  . BREAST REDUCTION SURGERY Bilateral 11/28/2015   Procedure: MAMMARY REDUCTION  (BREAST) BILATERAL;  Surgeon: Crissie Reese, MD;  Location: [facility];  Service: Plastics;  Laterality: Bilateral;  . CESAREAN SECTION    . COLONOSCOPY    . DENTAL SURGERY     2005,2013  . MASTECTOMY W/ SENTINEL NODE BIOPSY Bilateral 12/26/2015  . MASTECTOMY W/ SENTINEL NODE BIOPSY Bilateral 12/26/2015   Procedure: BILATERAL MASTECTOMY WITH LEFT SENTINEL LYMPH NODE BIOPSY;  Surgeon: Autumn Messing III, MD;  Location: [facility];  Service: General;  Laterality: Bilateral;  . TUBAL LIGATION      FAMILY HISTORY Family History  Problem Relation Age of Onset  . Diabetes Mother   . Hypertension Mother   . Hypertension Father   . Diabetes Maternal Grandmother   . Hypertension Sister   . Cancer Paternal Grandmother     colon  . Colon cancer Paternal Grandmother   . Esophageal cancer Neg Hx   . Stomach cancer Neg Hx   . Rectal cancer Neg Hx   The patient's parents are still living, in their early 82s as of June 2017. The patient has one brother, 2 sisters. On the maternal side there is a history of uterine and prostate cancer. On the paternal side there is a history of colon cancer and possibly uterine cancer. There is no history of breast or ovarian cancer in the family.   GYNECOLOGIC HISTORY:  No LMP recorded.  Menarche age 40. She still having regular periods. She is GX P1, first pregnancy age 79. She used oral contraceptives for a few years remotely, with no complications. She is currently on a Mirena IUD and also is status post bilateral tubal ligation.  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 Her husband Debbie [name] is a  Administrator. Their son Debbie [name] is a radio DJ. The patient has no grandchildren. She is not a Ambulance person.    ADVANCED DIRECTIVES: Not in place   HEALTH MAINTENANCE: Social History  Substance Use Topics  . Smoking status: Never Smoker  . Smokeless tobacco: Never Used  . Alcohol use No     Colonoscopy: January 2016   PAP: January 2016   Bone density: Never   Lipid panel:  Allergies  Allergen Reactions  . Ace Inhibitors Cough  . Atorvastatin Other (See Comments)    Joint pain  . Simvastatin Other (See Comments)    Joint pain    Current Outpatient Prescriptions  Medication Sig Dispense Refill  . acetaminophen (TYLENOL) 325 MG tablet Take 650 mg by mouth every 6 (six) hours as needed for mild pain. Reported on 10/23/2015    . Calcium-Magnesium 250-125 MG TABS Take 1 tablet by mouth daily. 120 each 4  . Cetirizine HCl (ZYRTEC ALLERGY PO) Take 1 tablet by mouth daily as needed (allergies).     . hydrochlorothiazide (MICROZIDE) 12.5 MG capsule Take 12.5 mg by mouth daily.    Marland Kitchen levonorgestrel (MIRENA) 20 MCG/24HR IUD 1 each  by Intrauterine route once.    Marland Kitchen levothyroxine (SYNTHROID, LEVOTHROID) 125 MCG tablet Take 125 mcg by mouth daily.    Marland Kitchen LIVALO 2 MG TABS Take 0.2 mg by mouth daily.   6  . losartan (COZAAR) 100 MG tablet Take 100 mg by mouth daily.    . metFORMIN (GLUCOPHAGE-XR) 750 MG 24 hr tablet Take 750 mg by mouth every evening.   6  . MULTIPLE MINERALS PO Take by mouth.    . omega-3 acid ethyl esters (LOVAZA) 1 g capsule Take 1 g by mouth 2 (two) times daily.    . potassium chloride SA (K-DUR,KLOR-CON) 20 MEQ tablet Take 40 mEq by mouth 2 (two) times daily.     . tamoxifen (NOLVADEX) 20 MG tablet Take 20 mg by mouth daily.    . Vitamin D, Cholecalciferol, 1000 units CAPS Take 1 tablet by mouth daily. 90 capsule 4   No current facility-administered medications for this visit.     OBJECTIVE: Middle-aged African-American woman in no acute distress Vitals:    07/08/16 1002  BP: (!) 160/75  Pulse: 85  Resp: 18  Temp: 98.2 F (36.8 C)     Body mass index is 38.95 kg/m.    ECOG FS:1 - Symptomatic but completely ambulatory  Sclerae unicteric, EOMs intact Oropharynx clear and moist No cervical or supraclavicular adenopathy Lungs no rales or rhonchi Heart regular rate and rhythm Abd soft, nontender, positive bowel sounds MSK no focal spinal tenderness, no upper extremity lymphedema Neuro: nonfocal, well oriented, appropriate affect Breasts: Status post bilateral mastectomies with no evidence of local recurrence. On the right there is significant hyperpigmentation from the radiation. Both axillae are benign.    LAB RESULTS:  CMP     Component Value Date/Time   NA 138 12/23/2015 0838   NA 140 10/23/2015 1228   K 4.1 12/23/2015 0838   K 3.8 10/23/2015 1228   CL 105 12/23/2015 0838   CO2 23 12/23/2015 0838   CO2 27 10/23/2015 1228   GLUCOSE 93 12/23/2015 0838   GLUCOSE 96 10/23/2015 1228   BUN 7 12/23/2015 0838   BUN 9.9 10/23/2015 1228   CREATININE 0.82 12/23/2015 0838   CREATININE 1.0 10/23/2015 1228   CALCIUM 9.5 12/23/2015 0838   CALCIUM 9.9 10/23/2015 1228   PROT 7.9 10/23/2015 1228   ALBUMIN 3.7 10/23/2015 1228   AST 16 10/23/2015 1228   ALT 19 10/23/2015 1228   ALKPHOS 142 10/23/2015 1228   BILITOT 0.46 10/23/2015 1228   GFRNONAA >60 12/23/2015 0838   GFRAA >60 12/23/2015 0838    INo results found for: SPEP, UPEP  Lab Results  Component Value Date   WBC 5.8 07/08/2016   NEUTROABS 4.1 07/08/2016   HGB 13.2 07/08/2016   HCT 39.9 07/08/2016   MCV 88.1 07/08/2016   PLT 276 07/08/2016      Chemistry      Component Value Date/Time   NA 138 12/23/2015 0838   NA 140 10/23/2015 1228   K 4.1 12/23/2015 0838   K 3.8 10/23/2015 1228   CL 105 12/23/2015 0838   CO2 23 12/23/2015 0838   CO2 27 10/23/2015 1228   BUN 7 12/23/2015 0838   BUN 9.9 10/23/2015 1228   CREATININE 0.82 12/23/2015 0838   CREATININE 1.0  10/23/2015 1228      Component Value Date/Time   CALCIUM 9.5 12/23/2015 0838   CALCIUM 9.9 10/23/2015 1228   ALKPHOS 142 10/23/2015 1228   AST 16 10/23/2015 1228  ALT 19 10/23/2015 1228   BILITOT 0.46 10/23/2015 1228       No results found for: LABCA2  No components found for: LABCA125  No results for input(s): INR in the last 168 hours.  Urinalysis No results found for: COLORURINE, APPEARANCEUR, LABSPEC, PHURINE, GLUCOSEU, HGBUR, BILIRUBINUR, KETONESUR, PROTEINUR, UROBILINOGEN, NITRITE, LEUKOCYTESUR   STUDIES: No results found.  ELIGIBLE FOR AVAILABLE RESEARCH PROTOCOL: PALLAS, but inelegible because of Mirena IUD  ASSESSMENT: 51 y.o. [place] woman status post right breast upper inner quadrant biopsy 10/15/2015 for a clinical T2-T3 N0, stage II invasive ductal carcinoma, estrogen and progesterone receptor positive, HER-2 nonamplified, with an Mib-1 of 70%  (1) status post right lumpectomy and sentinel lymph node sampling with oncoplastic breast reduction 11/28/2015 for a pT2 pN1, stage IIB invasive ductal carcinoma, grade 1, with negative margins (1/1 sentinel node positive)  (a) reclassified as stage IIA in the 2018 prognostic revision   (2) left reduction mammoplasty 11/28/2015 unexpectedly found a pT1c pNX invasive ductal carcinoma, grade 1, estrogen and progesterone receptor positive, HER-2 not amplified, with an MIB-1 of 3%  (3) Mammaprint from the right sided breast cancer was "low risk", predicting a 98% chance of no disease recurrence within 5 years with anti-estrogens as the only systemic therapy. It also predicts minimal to no benefit from chemotherapy  (4) status post bilateral mastectomies with bilateral sentinel lymph node sampling 12/26/2015, showing  (a) on the right, no residual carcinoma (0/3 nodes involved)  (b) on the left, focal ductal carcinoma in situ, 0.2 cm, with negative margins; (0/4 nodes involved)  (5) adjuvant radiation 02/11/16 - 03/27/16     1) Right chest wall: 50.4 Gy in 28 fractions               2) Right chest wall boost: 10 Gy in 5 fractions   (6) started on tamoxifen neoadjuvantly 10/23/2015 to allow for expected delays in definitive local treatment  (a) Mirena IUD in place  (7) since she had 4 lymph nodes removed from each axilla but also receive radiation on the right, lab draws should preferentially be obtained from the left arm   PLAN: Debbie [name] is now 6 months out from her surgery and has made a very successful reentry into her new normalcy. She is tolerating tamoxifen well. The plan will be to continue this for a minimum of 5 years at which point we will consider switching to aromatase inhibitors for 2 years.  I encouraged her to participate in our [place], most likely in the April or May meetings.  At this point we can start to plan for long-term follow-up. We'll see her surgeon Dr. Marlou Starks in July. She also sees her endocrinologist in July. She sees her primary care doctor and gynecologist in January. I'm going to see her in October. We'll do physical exam and lab work at that time.  She knows to call for any problems that may develop before that visit.       Chauncey Cruel, MD   07/08/2016 10:50 AM Medical Oncology and Hematology [facility] [address] [place], [place] [address] Tel. [phone]    Fax. [phone]

## 2016-07-22 MED FILL — TAMOXIFEN 20 MG TABLET: 20 | 90 days supply | Qty: 90 | Fill #3

## 2016-07-23 DIAGNOSIS — C50911 Malignant neoplasm of unspecified site of right female breast: Secondary | ICD-10-CM | POA: Diagnosis not present

## 2016-07-23 DIAGNOSIS — Z9012 Acquired absence of left breast and nipple: Secondary | ICD-10-CM | POA: Diagnosis not present

## 2016-08-05 DIAGNOSIS — R7301 Impaired fasting glucose: Secondary | ICD-10-CM | POA: Diagnosis not present

## 2016-08-05 DIAGNOSIS — E890 Postprocedural hypothyroidism: Secondary | ICD-10-CM | POA: Diagnosis not present

## 2016-08-05 MED FILL — HYDROCHLOROTHIAZIDE 12.5 MG: 12.5 | 90 days supply | Qty: 90 | Fill #0

## 2016-08-05 MED FILL — LIVALO 2 MG TABLET: 2 | 30 days supply | Qty: 30 | Fill #5

## 2016-08-21 DIAGNOSIS — C50911 Malignant neoplasm of unspecified site of right female breast: Secondary | ICD-10-CM | POA: Diagnosis not present

## 2016-08-21 DIAGNOSIS — Z9012 Acquired absence of left breast and nipple: Secondary | ICD-10-CM | POA: Diagnosis not present

## 2016-09-04 MED FILL — LEVOTHYROXINE 137 MCG TAB: 137 | 90 days supply | Qty: 90 | Fill #1

## 2016-09-10 MED FILL — METFORMIN HCL ER 750 MG TAB: 750 | 90 days supply | Qty: 90 | Fill #1

## 2016-09-10 MED FILL — POTASSIUM CL ER 20 MEQ TABL: 20 | 90 days supply | Qty: 360 | Fill #0

## 2016-09-10 MED FILL — LIVALO 2 MG TABLET: 2 | 90 days supply | Qty: 90 | Fill #0

## 2016-09-18 ENCOUNTER — Telehealth: Payer: Self-pay | Admitting: Oncology

## 2016-09-18 NOTE — Telephone Encounter (Signed)
Confirmed appt per LOS

## 2016-09-18 NOTE — Telephone Encounter (Signed)
Confirmed [facility] appt in August per pt will be out of town in July

## 2016-10-09 MED FILL — LOSARTAN POTASSIUM 100 MG T: 100 | 90 days supply | Qty: 90 | Fill #1

## 2016-10-20 MED FILL — TAMOXIFEN 20 MG TABLET: 20 | 90 days supply | Qty: 90 | Fill #4

## 2016-11-03 DIAGNOSIS — R7301 Impaired fasting glucose: Secondary | ICD-10-CM | POA: Diagnosis not present

## 2016-11-03 DIAGNOSIS — M791 Myalgia: Secondary | ICD-10-CM | POA: Diagnosis not present

## 2016-11-03 DIAGNOSIS — E890 Postprocedural hypothyroidism: Secondary | ICD-10-CM | POA: Diagnosis not present

## 2016-11-03 DIAGNOSIS — I10 Essential (primary) hypertension: Secondary | ICD-10-CM | POA: Diagnosis not present

## 2016-11-03 MED FILL — METFORMIN HCL ER 500 MG TAB: 500 | 90 days supply | Qty: 90 | Fill #0

## 2016-11-04 MED FILL — LEVOTHYROXINE 125 MCG TABLE: 125 | 90 days supply | Qty: 90 | Fill #0

## 2016-11-05 MED FILL — HYDROCHLOROTHIAZIDE 12.5 MG: 12.5 | 90 days supply | Qty: 90 | Fill #1

## 2016-11-10 MED FILL — AMOXICILLIN 500 MG CAPSULE: 500 | 4 days supply | Qty: 25 | Fill #0

## 2016-12-03 MED FILL — LIVALO 2 MG TABLET: 2 | 90 days supply | Qty: 90 | Fill #1

## 2016-12-08 ENCOUNTER — Encounter: Payer: 59 | Admitting: Adult Health

## 2016-12-17 NOTE — Progress Notes (Signed)
CLINIC:  Survivorship   REASON FOR VISIT:  Routine follow-up post-treatment for a recent history of breast cancer.  BRIEF ONCOLOGIC HISTORY:    Malignant neoplasm of upper-inner quadrant of right breast in female, estrogen receptor positive ([place])   10/15/2015 Initial Biopsy    Right breast upper inner quadrant biopsy: IDC, DCIS, grade 2, ER+(90%), PR+(50%), Ki67 70%, HER-2 negative (ratio 1.18).      10/17/2015 Initial Diagnosis    Malignant neoplasm of upper-inner quadrant of right breast in female, estrogen receptor positive ([place])     10/2015 -  Anti-estrogen oral therapy    Tamoxifen daily      11/28/2015 Surgery    Left breast mammoplasty: IDC, grade 1, 1.2cm, DCIS, lobular neoplasia, T1c, Nx, ER 80%, PR60%, Ki67 3% HER-2 neg Right breast lumpectomy: IDC, grade 1, 2.3cm, DCIS. T2N1a ER/PR+HER-2 neg, Ki 70%.      11/28/2015 Miscellaneous    Mammaprint was Low Risk. Predicting a 98% chance of no disease recurrence within 5 years with anti-estrogens as the only systemic therapy.  It also predicts minimal to no benefit from chemotherapy.        12/26/2015 Surgery    Bilateral mastectomies: bilateral sentinel node sampling, Right: no residual carcinoma, left, focal DCIS, 4 SLN negative      02/11/2016 - 03/27/2016 Radiation Therapy    Adjuvant radiation [name]): 1) Right chest wall: 50.4 Gy in 28 fractions  2) Right chest wall boost: 10 Gy in 5 fractions       INTERVAL HISTORY:  Ms. Wortley presents to the [facility] today for our initial meeting to review her survivorship care plan detailing her treatment course for breast cancer, as well as monitoring long-term side effects of that treatment, education regarding health maintenance, screening, and overall wellness and health promotion.     Overall, Ms. Mealing reports feeling quite well.  She is taking tamoxifen daily and is not having any issues.  She does have occasional hot flashes and she manages this with  fanning herself for the time being.      REVIEW OF SYSTEMS:  Review of Systems  Constitutional: Negative for appetite change, chills, fatigue, fever and unexpected weight change.  HENT:   Negative for hearing loss and lump/mass.   Eyes: Negative for eye problems and icterus.  Respiratory: Negative for chest tightness, cough and shortness of breath.   Cardiovascular: Negative for chest pain, leg swelling and palpitations.  Gastrointestinal: Negative for abdominal distention, abdominal pain, constipation, diarrhea, nausea and vomiting.  Endocrine: Positive for hot flashes.  Genitourinary: Negative for bladder incontinence, difficulty urinating, dyspareunia and vaginal discharge.   Skin: Negative for itching and rash.  Neurological: Negative for dizziness, extremity weakness, headaches and numbness.  Hematological: Negative for adenopathy. Does not bruise/bleed easily.  Psychiatric/Behavioral: Negative for depression. The patient is not nervous/anxious.   Breast: Denies any changes to her mastectomy sites     ONCOLOGY TREATMENT TEAM:  1. Surgeon:  Dr. Marlou Starks at [facility] 2. Medical Oncologist: Dr. [name]  3. Radiation Oncologist: Dr. [name]    PAST MEDICAL/SURGICAL HISTORY:  Past Medical History:  Diagnosis Date  . Allergy   . Anxiety   . Breast cancer of upper-inner quadrant of right female breast ([employer]) 10/17/2015  . Depression    no longer on medication off meds since Feb 2017  . Dysrhythmia   . Elevated cholesterol   . Family history of adverse reaction to anesthesia     mother has Copd was kept  on vent   . Headache   . Heart murmur   . History of radiation therapy 02/11/16- 03/27/16   Right Chest Wall 50.4 Gy in 28 fractions, Right Chest Wall Boost 10 Gy in 5 fractions.   . Hypertension   . Hypothyroidism   . Menorrhagia 2011  . Urinary incontinence 2010   Past Surgical History:  Procedure Laterality Date  . BREAST LUMPECTOMY Right 11/28/2015   RIGHT  BREAST LUMPECTOMY WITH NEEDLE LOCALIZATION AND AXILLARY SENTINEL LYMPH NODE BX (Right)  . BREAST LUMPECTOMY WITH NEEDLE LOCALIZATION AND AXILLARY SENTINEL LYMPH NODE BX Right 11/28/2015   Procedure: RIGHT BREAST LUMPECTOMY WITH NEEDLE LOCALIZATION AND AXILLARY SENTINEL LYMPH NODE BX;  Surgeon: Autumn Messing III, MD;  Location: [facility];  Service: General;  Laterality: Right;  . BREAST REDUCTION SURGERY Bilateral 11/28/2015   Procedure: MAMMARY REDUCTION  (BREAST) BILATERAL;  Surgeon: Crissie Reese, MD;  Location: [facility];  Service: Plastics;  Laterality: Bilateral;  . CESAREAN SECTION    . COLONOSCOPY    . DENTAL SURGERY     2005,2013  . MASTECTOMY W/ SENTINEL NODE BIOPSY Bilateral 12/26/2015  . MASTECTOMY W/ SENTINEL NODE BIOPSY Bilateral 12/26/2015   Procedure: BILATERAL MASTECTOMY WITH LEFT SENTINEL LYMPH NODE BIOPSY;  Surgeon: Autumn Messing III, MD;  Location: [facility];  Service: General;  Laterality: Bilateral;  . TUBAL LIGATION       ALLERGIES:  Allergies  Allergen Reactions  . Ace Inhibitors Cough  . Atorvastatin Other (See Comments)    Joint pain  . Simvastatin Other (See Comments)    Joint pain     CURRENT MEDICATIONS:  Outpatient Encounter Prescriptions as of 12/18/2016  Medication Sig  . acetaminophen (TYLENOL) 325 MG tablet Take 650 mg by mouth every 6 (six) hours as needed for mild pain. Reported on 10/23/2015  . Calcium-Magnesium 250-125 MG TABS Take 1 tablet by mouth daily.  . Cetirizine HCl (ZYRTEC ALLERGY PO) Take 1 tablet by mouth daily as needed (allergies).   . hydrochlorothiazide (MICROZIDE) 12.5 MG capsule Take 12.5 mg by mouth daily.  Marland Kitchen levonorgestrel (MIRENA) 20 MCG/24HR IUD 1 each by Intrauterine route once.  Marland Kitchen levothyroxine (SYNTHROID, LEVOTHROID) 125 MCG tablet Take 125 mcg by mouth daily.  Marland Kitchen LIVALO 2 MG TABS Take 0.2 mg by mouth daily.   Marland Kitchen losartan (COZAAR) 100 MG tablet Take 100 mg by mouth daily.  . metFORMIN (GLUCOPHAGE-XR) 750 MG 24 hr tablet Take 750 mg by mouth every  evening.   . MULTIPLE MINERALS PO Take by mouth.  . omega-3 acid ethyl esters (LOVAZA) 1 g capsule Take 1 g by mouth 2 (two) times daily.  . potassium chloride SA (K-DUR,KLOR-CON) 20 MEQ tablet Take 40 mEq by mouth 2 (two) times daily.   . tamoxifen (NOLVADEX) 20 MG tablet Take 20 mg by mouth daily.  . Vitamin D, Cholecalciferol, 1000 units CAPS Take 1 tablet by mouth daily.   No facility-administered encounter medications on file as of 12/18/2016.      ONCOLOGIC FAMILY HISTORY:  Family History  Problem Relation Age of Onset  . Diabetes Mother   . Hypertension Mother   . Hypertension Father   . Diabetes Maternal Grandmother   . Hypertension Sister   . Cancer Paternal Grandmother        colon  . Colon cancer Paternal Grandmother   . Esophageal cancer Neg Hx   . Stomach cancer Neg Hx   . Rectal cancer Neg Hx      GENETIC COUNSELING/TESTING: Not  at this time  SOCIAL HISTORY:  KAYLY KRIEGEL is married and lives with her husband and son in [place], [place].  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  She denies any current or history of tobacco, alcohol, or illicit drug use.     PHYSICAL EXAMINATION:  Vital Signs:   Vitals:   12/18/16 0852  BP: 130/79  Pulse: 83  Resp: 18  Temp: 98.2 F (36.8 C)  SpO2: 99%   Filed Weights   12/18/16 0852  Weight: 259 lb 1.6 oz (117.5 kg)   General: Well-nourished, well-appearing female in no acute distress.  She is unaccompanied today.   HEENT: Head is normocephalic.  Pupils equal and reactive to light. Conjunctivae clear without exudate.  Sclerae anicteric. Oral mucosa is pink, moist.  Oropharynx is pink without lesions or erythema.  Lymph: No cervical, supraclavicular, or infraclavicular lymphadenopathy noted on palpation.  Cardiovascular: Regular rate and rhythm.Marland Kitchen Respiratory: Clear to auscultation bilaterally. Chest expansion symmetric; breathing non-labored.  Breast: Right  mastectomy site is well healed, no nodularity or masses, hyperpigmentation is present, left mastectomy site is well healed, no nodularity or masses present. GI: Abdomen soft and round; non-tender, non-distended. Bowel sounds normoactive.  GU: Deferred.  Neuro: No focal deficits. Steady gait.  Psych: Mood and affect normal and appropriate for situation.  Extremities: No edema. MSK: No focal spinal tenderness to palpation.  Full range of motion in bilateral upper extremities Skin: Warm and dry.  LABORATORY DATA:  None for this visit.  DIAGNOSTIC IMAGING:  None for this visit.      ASSESSMENT AND PLAN:  Ms.. Corliss is a pleasant 51 y.o. female with Stage IA left breast invasive ductal carcinoma and stage IIB right breast invasive ductal carcinoma, ER+/PR+/HER2-, diagnosed in 10/2015, treated with bilateral mastectomy, adjuvant radiation therapy, and anti-estrogen therapy with Tamoxifen beginning in 10/2015.  She presents to the Survivorship Clinic for our initial meeting and routine follow-up post-completion of treatment for breast cancer.    1. Stage IIB right breast cancer and Stage IA left breast cancer:  Ms. Floyd is continuing to recover from definitive treatment for breast cancer. She will follow-up with her medical oncologist, Dr. [name] in February, 2019 with history and physical exam per surveillance protocol.  She will continue her anti-estrogen therapy with Tamoxifen. Thus far, she is tolerating the Tamoxifen well, with minimal side effects. She was instructed to make Dr. [name] or myself aware if she begins to experience any worsening side effects of the medication and I could see her back in clinic to help manage those side effects, as needed. Though the incidence is low, there is an associated risk of endometrial cancer with anti-estrogen therapies like Tamoxifen.  Ms. Firebaugh was encouraged to follow regularly with her gynecologist.  She also has Mirena IUD.  Her GYN  manages her cautiously and does do ultrasounds periodically which have been normal. Other side effects of Tamoxifen were again reviewed with her as well. Today, a comprehensive survivorship care plan and treatment summary was reviewed with the patient today detailing her breast cancer diagnosis, treatment course, potential late/long-term effects of treatment, appropriate follow-up care with recommendations for the future, and patient education resources.  A copy of this summary, along with a letter will be sent to the patient's primary care provider via mail/fax/In Basket message after today's visit.    2. Bone health:  Given Ms. Spike's history of breast cancer she is at slight risk  for bone demineralization.  She was counseled that Tamoxifen has a protective effect on the bones. She was given education on specific activities to promote bone health.  I will defer to her PCP or her GYN regarding bone density testing and management.   3. Cancer screening:  Due to Ms. Livers's history and her age, she should receive screening for skin cancers, colon cancer, and gynecologic cancers.  The information and recommendations are listed on the patient's comprehensive care plan/treatment summary and were reviewed in detail with the patient.    4. Health maintenance and wellness promotion: Ms. Escalante was encouraged to consume 5-7 servings of fruits and vegetables per day. We reviewed the "Nutrition Rainbow" handout, as well as the handout "Take Control of Your Health and Reduce Your Cancer Risk" from the [place].  She was also encouraged to engage in moderate to vigorous exercise for 30 minutes per day most days of the week. We discussed the [employer] fitness program, which is designed for cancer survivors to help them become more physically fit after cancer treatments.  She was instructed to limit her alcohol consumption and continue to abstain from tobacco use.     5. Support  services/counseling: It is not uncommon for this period of the patient's cancer care trajectory to be one of many emotions and stressors.  We discussed an opportunity for her to participate in the next session of [facility] Meeting ("Finding Your New Normal") support group series designed for patients after they have completed treatment.   Ms. Mulgrew was encouraged to take advantage of our many other support services programs, support groups, and/or counseling in coping with her new life as a cancer survivor after completing anti-cancer treatment.  She was offered support today through active listening and expressive supportive counseling.  She was given information regarding our available services and encouraged to contact me with any questions or for help enrolling in any of our support group/programs.    Dispo:   -Return to cancer center in February, 2019 for follow up with Dr. [name] -Follow up with Dr. Marlou Starks at [facility] in August, 2018. -She is welcome to return back to the Survivorship Clinic at any time; no additional follow-up needed at this time.  -Consider referral back to survivorship as a long-term survivor for continued surveillance  A total of (30) minutes of face-to-face time was spent with this patient with greater than 50% of that time in counseling and care-coordination.   Gardenia Phlegm, [place] [phone]   Note: PRIMARY CARE PROVIDER Kelton Pillar, [place] [phone]

## 2016-12-18 ENCOUNTER — Encounter: Payer: Self-pay | Admitting: Adult Health

## 2016-12-18 ENCOUNTER — Ambulatory Visit (HOSPITAL_BASED_OUTPATIENT_CLINIC_OR_DEPARTMENT_OTHER): Payer: 59 | Admitting: Adult Health

## 2016-12-18 VITALS — BP 130/79 | HR 83 | Temp 98.2°F | Resp 18 | Ht 66.5 in | Wt 259.1 lb

## 2016-12-18 DIAGNOSIS — Z79810 Long term (current) use of selective estrogen receptor modulators (SERMs): Secondary | ICD-10-CM

## 2016-12-18 DIAGNOSIS — C50112 Malignant neoplasm of central portion of left female breast: Secondary | ICD-10-CM

## 2016-12-18 DIAGNOSIS — C50912 Malignant neoplasm of unspecified site of left female breast: Secondary | ICD-10-CM

## 2016-12-18 DIAGNOSIS — C50211 Malignant neoplasm of upper-inner quadrant of right female breast: Secondary | ICD-10-CM

## 2016-12-18 DIAGNOSIS — Z170 Estrogen receptor positive status [ER+]: Secondary | ICD-10-CM

## 2016-12-28 DIAGNOSIS — C50912 Malignant neoplasm of unspecified site of left female breast: Secondary | ICD-10-CM | POA: Diagnosis not present

## 2016-12-28 DIAGNOSIS — C50911 Malignant neoplasm of unspecified site of right female breast: Secondary | ICD-10-CM | POA: Diagnosis not present

## 2017-01-06 MED FILL — LOSARTAN POTASSIUM 100 MG T: 100 | 90 days supply | Qty: 90 | Fill #2

## 2017-01-12 DIAGNOSIS — E890 Postprocedural hypothyroidism: Secondary | ICD-10-CM | POA: Diagnosis not present

## 2017-01-19 ENCOUNTER — Other Ambulatory Visit: Payer: Self-pay | Admitting: Oncology

## 2017-01-19 MED FILL — TAMOXIFEN CITRATE 20 MG TAB: 20 | 90 days supply | Qty: 90 | Fill #0

## 2017-01-28 MED FILL — HYDROCHLOROTHIAZIDE 12.5 MG: 12.5 | 90 days supply | Qty: 90 | Fill #0

## 2017-01-29 DIAGNOSIS — H5213 Myopia, bilateral: Secondary | ICD-10-CM | POA: Diagnosis not present

## 2017-02-03 MED FILL — LEVOTHYROXINE 125 MCG TABLE: 125 | 90 days supply | Qty: 90 | Fill #1

## 2017-02-16 MED FILL — METFORMIN HCL ER 500 MG TAB: 500 | 90 days supply | Qty: 90 | Fill #1

## 2017-03-02 ENCOUNTER — Ambulatory Visit: Payer: 59 | Admitting: Oncology

## 2017-03-02 ENCOUNTER — Other Ambulatory Visit: Payer: 59

## 2017-03-10 MED FILL — LIVALO 2 MG TABLET: 2 | 90 days supply | Qty: 90 | Fill #2

## 2017-04-07 MED FILL — LOSARTAN POTASSIUM 100 MG T: 100 | 90 days supply | Qty: 90 | Fill #3

## 2017-04-21 MED FILL — TAMOXIFEN CITRATE 20 MG TAB: 20 | 90 days supply | Qty: 90 | Fill #1

## 2017-04-28 MED FILL — POTASSIUM CL ER 20 MEQ TABL: 20 | 90 days supply | Qty: 360 | Fill #1

## 2017-04-28 MED FILL — LEVOTHYROXINE 125 MCG TAB: 125 | 90 days supply | Qty: 90 | Fill #0

## 2017-04-28 MED FILL — HYDROCHLOROTHIAZIDE 12.5 MG: 12.5 | 90 days supply | Qty: 90 | Fill #1

## 2017-05-18 DIAGNOSIS — I10 Essential (primary) hypertension: Secondary | ICD-10-CM | POA: Diagnosis not present

## 2017-05-18 DIAGNOSIS — E7800 Pure hypercholesterolemia, unspecified: Secondary | ICD-10-CM | POA: Diagnosis not present

## 2017-05-18 DIAGNOSIS — F39 Unspecified mood [affective] disorder: Secondary | ICD-10-CM | POA: Diagnosis not present

## 2017-05-18 DIAGNOSIS — Z0000 Encounter for general adult medical examination without abnormal findings: Secondary | ICD-10-CM | POA: Diagnosis not present

## 2017-05-18 DIAGNOSIS — E039 Hypothyroidism, unspecified: Secondary | ICD-10-CM | POA: Diagnosis not present

## 2017-05-18 DIAGNOSIS — N951 Menopausal and female climacteric states: Secondary | ICD-10-CM | POA: Diagnosis not present

## 2017-05-18 DIAGNOSIS — D0510 Intraductal carcinoma in situ of unspecified breast: Secondary | ICD-10-CM | POA: Diagnosis not present

## 2017-05-18 DIAGNOSIS — E669 Obesity, unspecified: Secondary | ICD-10-CM | POA: Diagnosis not present

## 2017-05-24 DIAGNOSIS — N912 Amenorrhea, unspecified: Secondary | ICD-10-CM | POA: Diagnosis not present

## 2017-05-24 DIAGNOSIS — C50919 Malignant neoplasm of unspecified site of unspecified female breast: Secondary | ICD-10-CM | POA: Diagnosis not present

## 2017-05-24 DIAGNOSIS — D259 Leiomyoma of uterus, unspecified: Secondary | ICD-10-CM | POA: Diagnosis not present

## 2017-05-24 DIAGNOSIS — Z6841 Body Mass Index (BMI) 40.0 and over, adult: Secondary | ICD-10-CM | POA: Diagnosis not present

## 2017-05-24 DIAGNOSIS — Z01419 Encounter for gynecological examination (general) (routine) without abnormal findings: Secondary | ICD-10-CM | POA: Diagnosis not present

## 2017-05-24 DIAGNOSIS — N920 Excessive and frequent menstruation with regular cycle: Secondary | ICD-10-CM | POA: Diagnosis not present

## 2017-05-24 DIAGNOSIS — N393 Stress incontinence (female) (male): Secondary | ICD-10-CM | POA: Diagnosis not present

## 2017-05-26 MED FILL — LEVOTHYROXINE 137 MCG TABLE: 137 | 90 days supply | Qty: 90 | Fill #0

## 2017-06-11 MED FILL — LIVALO 2 MG TABLET: 2 | 90 days supply | Qty: 90 | Fill #0

## 2017-06-21 ENCOUNTER — Other Ambulatory Visit: Payer: Self-pay

## 2017-06-21 DIAGNOSIS — Z170 Estrogen receptor positive status [ER+]: Secondary | ICD-10-CM

## 2017-06-21 DIAGNOSIS — C50211 Malignant neoplasm of upper-inner quadrant of right female breast: Secondary | ICD-10-CM

## 2017-06-21 NOTE — Progress Notes (Signed)
Debbie [name]  [phone] [phone]     ID: Debbie [name] DOB: 1966-01-19  MR#: [id number]  QBH#:[id number]  Patient Care Team: Kelton Pillar, MD as PCP - General (Family Medicine) Jacelyn Pi, MD as Consulting Physician (Endocrinology) Leo Grosser Seymour Bars, MD as Consulting Physician (Obstetrics and Gynecology) Jovita Kussmaul, MD as Consulting Physician (General Surgery) Skylynn Burkley, Virgie Dad, MD as Consulting Physician (Oncology) [name], MD as Attending Physician (Radiation Oncology) Belva Crome, MD as Consulting Physician (Cardiology) Crissie Reese, MD as Consulting Physician (Plastic Surgery) [name], [name], NP as Nurse Practitioner (Hematology and Oncology) PCP: Kelton Pillar, MD OTHER MD:  CHIEF COMPLAINT: Estrogen receptor positive breast cancer  CURRENT TREATMENT: tamoxifen   BREAST CANCER HISTORY:  from the original intake note:  Debbie [name] had screening mammography with tomosynthesis at the [facility] 10[facility]02/2015. This shows 2 possible masses in the right breast as well as some suspicious calcifications. The left breast was unremarkable. On 10/15/2015 she underwent right diagnostic mammography with tomography and right breast ultrasonography. The breast density was category B. Spot magnification views confirmed an area of pleomorphic calcifications measuring up to 5 cm in the upper half of the breast. Also in the upper outer right breast there were adjacent low-density masses without distortion or calcifications. On physical exam there was no palpable abnormality.  Targeted ultrasound of the right breast found the 2 "masses" in the right upper quadrant were benign cysts measuring 0.8 and 0.5 cm respectively. Biopsy of the area of calcification on 10/15/2015 showed ([id number]) invasive ductal carcinoma, E-cadherin positive, grade 2, estrogen receptor 90% positive, progesterone receptor 50% positive, both with  strong staining intensity, with an MIB-1 of 70%, and no HER-2 amplification, the signals ratio being 1.18 and the number per cell 2.60.  Her subsequent history is as detailed below    INTERVAL HISTORY: Debbie [name] returns today for follow-up of her estrogen receptor positive breast cancer. She continues on tamoxifen, with good tolerance. She notes that she has hot flashes that vary in intensity. She notes that most days are not too bad. She denies issues with increased vaginal wetness.   REVIEW OF SYSTEMS: Keilana reports that she is working and enjoying life. She notes that her joints are stiff and getting out the the bed is sometimes difficult. She notes that she takes tylenol, which helps. 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 She notes that she works on the weekend, and she stays busy She notes that she takes a stool softner every so often for constipation. She denies unusual headaches, visual changes, nausea, vomiting, or dizziness. There has been no unusual cough, phlegm production, or pleurisy. This been no change in bowel or bladder habits. She denies unexplained fatigue or unexplained weight loss, bleeding, rash, or fever. A detailed review of systems was otherwise stable.    PAST MEDICAL HISTORY: Past Medical History:  Diagnosis Date  . Allergy   . Anxiety   . Breast cancer of upper-inner quadrant of right female breast ([place]) 10/17/2015  . Depression    no longer on medication off meds since Feb 2017  . Dysrhythmia   . Elevated cholesterol   . Family history of adverse reaction to anesthesia     mother has Copd was kept on vent   . Headache   . Heart murmur   . History of radiation therapy 02/11/16- 03/27/16   Right Chest Wall 50.4 Gy in 28  fractions, Right Chest Wall Boost 10 Gy in 5 fractions.   . Hypertension   . Hypothyroidism   . Menorrhagia 2011  . Urinary incontinence 2010    PAST SURGICAL HISTORY: Past Surgical History:    Procedure Laterality Date  . BREAST LUMPECTOMY Right 11/28/2015   RIGHT BREAST LUMPECTOMY WITH NEEDLE LOCALIZATION AND AXILLARY SENTINEL LYMPH NODE BX (Right)  . BREAST LUMPECTOMY WITH NEEDLE LOCALIZATION AND AXILLARY SENTINEL LYMPH NODE BX Right 11/28/2015   Procedure: RIGHT BREAST LUMPECTOMY WITH NEEDLE LOCALIZATION AND AXILLARY SENTINEL LYMPH NODE BX;  Surgeon: Autumn Messing III, MD;  Location: [facility];  Service: General;  Laterality: Right;  . BREAST REDUCTION SURGERY Bilateral 11/28/2015   Procedure: MAMMARY REDUCTION  (BREAST) BILATERAL;  Surgeon: Crissie Reese, MD;  Location: [facility];  Service: Plastics;  Laterality: Bilateral;  . CESAREAN SECTION    . COLONOSCOPY    . DENTAL SURGERY     2005,2013  . MASTECTOMY W/ SENTINEL NODE BIOPSY Bilateral 12/26/2015  . MASTECTOMY W/ SENTINEL NODE BIOPSY Bilateral 12/26/2015   Procedure: BILATERAL MASTECTOMY WITH LEFT SENTINEL LYMPH NODE BIOPSY;  Surgeon: Autumn Messing III, MD;  Location: [facility];  Service: General;  Laterality: Bilateral;  . TUBAL LIGATION      FAMILY HISTORY Family History  Problem Relation Age of Onset  . Diabetes Mother   . Hypertension Mother   . Hypertension Father   . Diabetes Maternal Grandmother   . Hypertension Sister   . Cancer Paternal Grandmother        colon  . Colon cancer Paternal Grandmother   . Esophageal cancer Neg Hx   . Stomach cancer Neg Hx   . Rectal cancer Neg Hx   The patient's parents are still living, in their early 29s as of June 2017. The patient has one brother, 2 sisters. On the maternal side there is a history of uterine and prostate cancer. On the paternal side there is a history of colon cancer and possibly uterine cancer. There is no history of breast or ovarian cancer in the family.   GYNECOLOGIC HISTORY:  No LMP recorded.  Menarche age 47. She still having regular periods. She is GX P1, first pregnancy age 55. She used oral contraceptives for a few years remotely, with no complications. She is  currently on a Mirena IUD and also is status post bilateral tubal ligation.  SOCIAL HISTORY:  [place] works as a Technical sales engineer at [employer]. Her husband [name] is a Administrator. Their son [name] is a radio DJ. The patient has no grandchildren. She is not a Ambulance person.    ADVANCED DIRECTIVES: Not in place   HEALTH MAINTENANCE: Social History   Tobacco Use  . Smoking status: Never Smoker  . Smokeless tobacco: Never Used  Substance Use Topics  . Alcohol use: No  . Drug use: No     Colonoscopy: January 2016   PAP: January 2016   Bone density: August 2017 showed a T score of +0.7 normal  Lipid panel:  Allergies  Allergen Reactions  . Ace Inhibitors Cough  . Atorvastatin Other (See Comments)    Joint pain  . Simvastatin Other (See Comments)    Joint pain    Current Outpatient Medications  Medication Sig Dispense Refill  . acetaminophen (TYLENOL) 325 MG tablet Take 650 mg by mouth every 6 (six) hours as needed for mild pain. Reported on 10/23/2015    . Calcium-Magnesium 250-125 MG TABS Take 1 tablet by mouth daily. 120 each  4  . Cetirizine HCl (ZYRTEC ALLERGY PO) Take 1 tablet by mouth daily as needed (allergies).     . hydrochlorothiazide (MICROZIDE) 12.5 MG capsule Take 12.5 mg by mouth daily.    Marland Kitchen levonorgestrel (MIRENA) 20 MCG/24HR IUD 1 each by Intrauterine route once.    Marland Kitchen levothyroxine (SYNTHROID, LEVOTHROID) 125 MCG tablet Take 125 mcg by mouth daily.    Marland Kitchen LIVALO 2 MG TABS Take 0.2 mg by mouth daily.   6  . losartan (COZAAR) 100 MG tablet Take 100 mg by mouth daily.    . metFORMIN (GLUCOPHAGE-XR) 750 MG 24 hr tablet Take 750 mg by mouth every evening.   6  . MULTIPLE MINERALS PO Take by mouth.    . omega-3 acid ethyl esters (LOVAZA) 1 g capsule Take 1 g by mouth 2 (two) times daily.    . potassium chloride SA (K-DUR,KLOR-CON) 20 MEQ tablet Take 40 mEq by mouth 2 (two) times daily.     . tamoxifen (NOLVADEX) 20 MG tablet Take 20 mg by mouth daily.    .  tamoxifen (NOLVADEX) 20 MG tablet TAKE 1 TABLET BY MOUTH DAILY. 90 tablet 12  . Vitamin D, Cholecalciferol, 1000 units CAPS Take 1 tablet by mouth daily. 90 capsule 4   No current facility-administered medications for this visit.     OBJECTIVE: Middle-aged African-American woman who appears well  Vitals:   06/22/17 1205  BP: (!) 158/96  Pulse: (!) 110  Resp: 18  Temp: 98.5 F (36.9 C)  SpO2: 98%     Body mass index is 41.99 kg/m.    ECOG FS:0 - Asymptomatic  Sclerae unicteric, pupils round and equal Oropharynx clear and moist No cervical or supraclavicular adenopathy Lungs no rales or rhonchi Heart regular rate and rhythm Abd soft, nontender, positive bowel sounds MSK no focal spinal tenderness, no upper extremity lymphedema Neuro: nonfocal, well oriented, appropriate affect Breasts: Status post bilateral mastectomies.  There is no evidence of chest wall recurrence.  On the right side there is also radiation and there is still some hyperpigmentation over the port area.  Both axillae are benign  LAB RESULTS:  CMP     Component Value Date/Time   NA 140 07/08/2016 0948   K 4.1 07/08/2016 0948   CL 105 12/23/2015 0838   CO2 24 07/08/2016 0948   GLUCOSE 89 07/08/2016 0948   BUN 11.9 07/08/2016 0948   CREATININE 0.9 07/08/2016 0948   CALCIUM 10.0 07/08/2016 0948   PROT 7.6 07/08/2016 0948   ALBUMIN 3.6 07/08/2016 0948   AST 22 07/08/2016 0948   ALT 27 07/08/2016 0948   ALKPHOS 115 07/08/2016 0948   BILITOT 0.35 07/08/2016 0948   GFRNONAA >60 12/23/2015 0838   GFRAA >60 12/23/2015 0838    INo results found for: SPEP, UPEP  Lab Results  Component Value Date   WBC 5.7 06/22/2017   NEUTROABS 3.8 06/22/2017   HGB 13.2 07/08/2016   HCT 40.0 06/22/2017   MCV 92.9 06/22/2017   PLT 256 06/22/2017      Chemistry      Component Value Date/Time   NA 140 07/08/2016 0948   K 4.1 07/08/2016 0948   CL 105 12/23/2015 0838   CO2 24 07/08/2016 0948   BUN 11.9 07/08/2016  0948   CREATININE 0.9 07/08/2016 0948      Component Value Date/Time   CALCIUM 10.0 07/08/2016 0948   ALKPHOS 115 07/08/2016 0948   AST 22 07/08/2016 0948   ALT 27 07/08/2016  0948   BILITOT 0.35 07/08/2016 0948       No results found for: LABCA2  No components found for: [id number]  No results for input(s): INR in the last 168 hours.  Urinalysis No results found for: COLORURINE, APPEARANCEUR, LABSPEC, PHURINE, GLUCOSEU, HGBUR, BILIRUBINUR, KETONESUR, PROTEINUR, UROBILINOGEN, NITRITE, LEUKOCYTESUR   STUDIES: No results found.  ELIGIBLE FOR AVAILABLE RESEARCH PROTOCOL: PALLAS, but inelegible because of Mirena IUD  ASSESSMENT: 52 y.o. [place] woman status post right breast upper inner quadrant biopsy 10/15/2015 for a clinical T2-T3 N0, stage II invasive ductal carcinoma, estrogen and progesterone receptor positive, HER-2 nonamplified, with an Mib-1 of 70%  (1) status post right lumpectomy and sentinel lymph node sampling with oncoplastic breast reduction 11/28/2015 for a pT2 pN1, stage IIB invasive ductal carcinoma, grade 1, with negative margins (1/1 sentinel node positive)  (a) reclassified as stage IIA in the 2018 prognostic revision   (2) left reduction mammoplasty 11/28/2015 unexpectedly found a pT1c pNX invasive ductal carcinoma, grade 1, estrogen and progesterone receptor positive, HER-2 not amplified, with an MIB-1 of 3%  (3) Mammaprint from the right sided breast cancer was "low risk", predicting a 98% chance of no disease recurrence within 5 years with anti-estrogens as the only systemic therapy. It also predicts minimal to no benefit from chemotherapy  (4) status post bilateral mastectomies with bilateral sentinel lymph node sampling 12/26/2015, showing  (a) on the right, no residual carcinoma (0/3 nodes involved)  (b) on the left, focal ductal carcinoma in situ, 0.2 cm, with negative margins; (0/4 nodes involved)  (5) adjuvant radiation 02/11/16 - 03/27/16    1)  Right chest wall: 50.4 Gy in 28 fractions               2) Right chest wall boost: 10 Gy in 5 fractions   (6) started on tamoxifen neoadjuvantly 10/23/2015 to allow for expected delays in definitive local treatment  (a) Mirena IUD in place  (7) since she had 4 lymph nodes removed from each axilla but also receive radiation on the right, lab draws should preferentially be obtained from the left arm   PLAN: [place] is now a year and a half out from definitive surgery for her breast cancer with no evidence of disease recurrence.  This is very favorable.  Plan is to continue tamoxifen for a total of 5 years.  She will see Dr. Marlou Starks later this month.  I am going to see her again in September.  If she then sees Dr. Marlou Starks next in March of next year we will each see her once a year and she will be seen every 6 months for the breast cancer issue  I have encouraged her to cut the carbs if she wishes to lose weight, which is 1 of her goals.  She knows to call for any issues that may develop before the next visit.  Nikiyah Fackler, Virgie Dad, MD  06/22/17 12:15 PM Medical Oncology and Hematology [facility] [address] [place], [place] [address] Tel. [phone]    Fax. [phone]  This document serves as a record of services personally performed by Lurline Del, MD. It was created on his behalf by Sheron Nightingale, a trained medical scribe. The creation of this record is based on the scribe's personal observations and the provider's statements to them.   I have reviewed the above documentation for accuracy and completeness, and I agree with the above.

## 2017-06-22 ENCOUNTER — Telehealth: Payer: Self-pay | Admitting: Oncology

## 2017-06-22 ENCOUNTER — Inpatient Hospital Stay (HOSPITAL_BASED_OUTPATIENT_CLINIC_OR_DEPARTMENT_OTHER): Payer: 59 | Admitting: Oncology

## 2017-06-22 ENCOUNTER — Inpatient Hospital Stay: Payer: 59 | Attending: Oncology

## 2017-06-22 VITALS — BP 158/96 | HR 110 | Temp 98.5°F | Resp 18 | Ht 66.5 in | Wt 264.1 lb

## 2017-06-22 DIAGNOSIS — Z9013 Acquired absence of bilateral breasts and nipples: Secondary | ICD-10-CM | POA: Diagnosis not present

## 2017-06-22 DIAGNOSIS — I10 Essential (primary) hypertension: Secondary | ICD-10-CM | POA: Insufficient documentation

## 2017-06-22 DIAGNOSIS — Z170 Estrogen receptor positive status [ER+]: Secondary | ICD-10-CM

## 2017-06-22 DIAGNOSIS — F419 Anxiety disorder, unspecified: Secondary | ICD-10-CM | POA: Diagnosis not present

## 2017-06-22 DIAGNOSIS — E7800 Pure hypercholesterolemia, unspecified: Secondary | ICD-10-CM | POA: Insufficient documentation

## 2017-06-22 DIAGNOSIS — E039 Hypothyroidism, unspecified: Secondary | ICD-10-CM | POA: Insufficient documentation

## 2017-06-22 DIAGNOSIS — Z79810 Long term (current) use of selective estrogen receptor modulators (SERMs): Secondary | ICD-10-CM | POA: Insufficient documentation

## 2017-06-22 DIAGNOSIS — C50412 Malignant neoplasm of upper-outer quadrant of left female breast: Secondary | ICD-10-CM

## 2017-06-22 DIAGNOSIS — C50211 Malignant neoplasm of upper-inner quadrant of right female breast: Secondary | ICD-10-CM | POA: Diagnosis not present

## 2017-06-22 DIAGNOSIS — C50112 Malignant neoplasm of central portion of left female breast: Secondary | ICD-10-CM

## 2017-06-22 DIAGNOSIS — Z79899 Other long term (current) drug therapy: Secondary | ICD-10-CM | POA: Diagnosis not present

## 2017-06-22 DIAGNOSIS — Z975 Presence of (intrauterine) contraceptive device: Secondary | ICD-10-CM

## 2017-06-22 DIAGNOSIS — F329 Major depressive disorder, single episode, unspecified: Secondary | ICD-10-CM | POA: Insufficient documentation

## 2017-06-22 DIAGNOSIS — Z923 Personal history of irradiation: Secondary | ICD-10-CM | POA: Insufficient documentation

## 2017-06-22 DIAGNOSIS — C50411 Malignant neoplasm of upper-outer quadrant of right female breast: Secondary | ICD-10-CM

## 2017-06-22 LAB — CMP (CANCER CENTER ONLY)
ALT: 45 U/L (ref 0–55)
AST: 31 U/L (ref 5–34)
Albumin: 3.5 g/dL (ref 3.5–5.0)
Alkaline Phosphatase: 135 U/L (ref 40–150)
Anion gap: 10 (ref 3–11)
BUN: 11 mg/dL (ref 7–26)
CO2: 26 mmol/L (ref 22–29)
Calcium: 9.8 mg/dL (ref 8.4–10.4)
Chloride: 107 mmol/L (ref 98–109)
Creatinine: 0.9 mg/dL (ref 0.60–1.10)
GFR, Est AFR Am: >60 mL/min (ref >60)
GFR, Estimated: >60 mL/min (ref >60)
Glucose, Bld: 94 mg/dL (ref 70–140)
Potassium: 4.3 mmol/L (ref 3.5–5.1)
Sodium: 143 mmol/L (ref 136–145)
Total Bilirubin: 0.4 mg/dL (ref 0.2–1.2)
Total Protein: 7.5 g/dL (ref 6.4–8.3)

## 2017-06-22 LAB — CBC WITH DIFFERENTIAL (CANCER CENTER ONLY)
Basophils Absolute: 0 10*3/uL (ref 0.0–0.1)
Basophils Relative: 1 %
Eosinophils Absolute: 0.2 10*3/uL (ref 0.0–0.5)
Eosinophils Relative: 3 %
HCT: 40 % (ref 34.8–46.6)
Hemoglobin: 13.3 g/dL (ref 11.6–15.9)
Lymphocytes Relative: 20 %
Lymphs Abs: 1.1 10*3/uL (ref 0.9–3.3)
MCH: 30.9 pg (ref 25.1–34.0)
MCHC: 33.3 g/dL (ref 31.5–36.0)
MCV: 92.9 fL (ref 79.5–101.0)
Monocytes Absolute: 0.5 10*3/uL (ref 0.1–0.9)
Monocytes Relative: 9 %
Neutro Abs: 3.8 10*3/uL (ref 1.5–6.5)
Neutrophils Relative %: 67 %
Platelet Count: 256 10*3/uL (ref 145–400)
RBC: 4.31 MIL/uL (ref 3.70–5.45)
RDW: 13.7 % (ref 11.2–14.5)
WBC Count: 5.7 10*3/uL (ref 3.9–10.3)

## 2017-06-22 MED ORDER — TAMOXIFEN CITRATE 20 MG PO TABS: 20.0000 mg | ORAL_TABLET | Freq: Every day | ORAL | 12 refills | Status: DC

## 2017-06-22 NOTE — Telephone Encounter (Signed)
Gave avs and calendar for September  °

## 2017-06-30 DIAGNOSIS — C50912 Malignant neoplasm of unspecified site of left female breast: Secondary | ICD-10-CM | POA: Diagnosis not present

## 2017-06-30 DIAGNOSIS — C50911 Malignant neoplasm of unspecified site of right female breast: Secondary | ICD-10-CM | POA: Diagnosis not present

## 2017-07-02 ENCOUNTER — Other Ambulatory Visit: Payer: Self-pay | Admitting: General Surgery

## 2017-07-02 DIAGNOSIS — R140 Abdominal distension (gaseous): Secondary | ICD-10-CM

## 2017-07-08 MED FILL — LOSARTAN POTASSIUM 100 MG T: 100 | 90 days supply | Qty: 90 | Fill #0

## 2017-07-20 MED FILL — TAMOXIFEN CITRATE 20 MG TAB: 20 | 90 days supply | Qty: 90 | Fill #2

## 2017-07-23 DIAGNOSIS — E039 Hypothyroidism, unspecified: Secondary | ICD-10-CM | POA: Diagnosis not present

## 2017-08-03 MED FILL — HYDROCHLOROTHIAZIDE 12.5 MG: 12.5 | 90 days supply | Qty: 90 | Fill #0

## 2017-08-04 ENCOUNTER — Other Ambulatory Visit: Payer: Self-pay | Admitting: Oncology

## 2017-08-04 ENCOUNTER — Ambulatory Visit
Admission: RE | Admit: 2017-08-04 | Discharge: 2017-08-04 | Disposition: A | Discharge status: Home / Self Care | Payer: 59 | Source: Ambulatory Visit | Attending: General Surgery | Admitting: General Surgery

## 2017-08-04 DIAGNOSIS — C50012 Malignant neoplasm of nipple and areola, left female breast: Secondary | ICD-10-CM

## 2017-08-04 DIAGNOSIS — C50211 Malignant neoplasm of upper-inner quadrant of right female breast: Secondary | ICD-10-CM

## 2017-08-04 DIAGNOSIS — R140 Abdominal distension (gaseous): Secondary | ICD-10-CM

## 2017-08-04 DIAGNOSIS — Z170 Estrogen receptor positive status [ER+]: Secondary | ICD-10-CM

## 2017-08-04 DIAGNOSIS — R109 Unspecified abdominal pain: Secondary | ICD-10-CM | POA: Diagnosis not present

## 2017-08-04 DIAGNOSIS — C50112 Malignant neoplasm of central portion of left female breast: Secondary | ICD-10-CM

## 2017-08-04 DIAGNOSIS — C50011 Malignant neoplasm of nipple and areola, right female breast: Secondary | ICD-10-CM

## 2017-08-04 MED ORDER — IOPAMIDOL (ISOVUE-300) INJECTION 61%
125.0000 mL | Freq: Once | INTRAVENOUS | Status: AC | PRN
  Administered 2017-08-04: 125 mL via INTRAVENOUS

## 2017-08-04 NOTE — Progress Notes (Unsigned)
I was called by Dr. Marlou Starks telling me that Debbie [name] was feeling poorly, and he obtained a CT of the abdomen and pelvis which suggests metastases to bone and liver.  They have suggested further testing with MRIs which I have gone ahead and written.  Of also put her in for tumor marker.  She will see me again 04/05 to discuss results.  I called her and let her know what the plan was.

## 2017-08-05 ENCOUNTER — Telehealth: Payer: Self-pay

## 2017-08-05 ENCOUNTER — Telehealth: Payer: Self-pay | Admitting: Oncology

## 2017-08-05 DIAGNOSIS — C801 Malignant (primary) neoplasm, unspecified: Secondary | ICD-10-CM | POA: Diagnosis not present

## 2017-08-05 DIAGNOSIS — C7951 Secondary malignant neoplasm of bone: Secondary | ICD-10-CM | POA: Diagnosis not present

## 2017-08-05 DIAGNOSIS — C7989 Secondary malignant neoplasm of other specified sites: Secondary | ICD-10-CM | POA: Diagnosis not present

## 2017-08-05 NOTE — Telephone Encounter (Signed)
Left msg for patient informing her labs for tomorrow did not need to be fasting.  Cyndia Bent RN

## 2017-08-05 NOTE — Telephone Encounter (Signed)
Called patient regarding 3/29 and 4/5

## 2017-08-06 ENCOUNTER — Inpatient Hospital Stay: Payer: 59 | Attending: Oncology

## 2017-08-06 DIAGNOSIS — Z923 Personal history of irradiation: Secondary | ICD-10-CM | POA: Diagnosis not present

## 2017-08-06 DIAGNOSIS — Z9013 Acquired absence of bilateral breasts and nipples: Secondary | ICD-10-CM | POA: Diagnosis not present

## 2017-08-06 DIAGNOSIS — Z170 Estrogen receptor positive status [ER+]: Secondary | ICD-10-CM | POA: Diagnosis not present

## 2017-08-06 DIAGNOSIS — Z79810 Long term (current) use of selective estrogen receptor modulators (SERMs): Secondary | ICD-10-CM | POA: Insufficient documentation

## 2017-08-06 DIAGNOSIS — C50112 Malignant neoplasm of central portion of left female breast: Secondary | ICD-10-CM

## 2017-08-06 DIAGNOSIS — Z975 Presence of (intrauterine) contraceptive device: Secondary | ICD-10-CM | POA: Diagnosis not present

## 2017-08-06 DIAGNOSIS — C50211 Malignant neoplasm of upper-inner quadrant of right female breast: Secondary | ICD-10-CM | POA: Insufficient documentation

## 2017-08-06 DIAGNOSIS — C50011 Malignant neoplasm of nipple and areola, right female breast: Secondary | ICD-10-CM

## 2017-08-06 DIAGNOSIS — C50012 Malignant neoplasm of nipple and areola, left female breast: Secondary | ICD-10-CM

## 2017-08-06 LAB — COMPREHENSIVE METABOLIC PANEL
ALT: 48 U/L (ref 0–55)
AST: 34 U/L (ref 5–34)
Albumin: 3.6 g/dL (ref 3.5–5.0)
Alkaline Phosphatase: 127 U/L (ref 40–150)
Anion gap: 10 (ref 3–11)
BUN: 13 mg/dL (ref 7–26)
CO2: 24 mmol/L (ref 22–29)
Calcium: 10 mg/dL (ref 8.4–10.4)
Chloride: 105 mmol/L (ref 98–109)
Creatinine, Ser: 0.86 mg/dL (ref 0.60–1.10)
GFR calc Af Amer: >60 mL/min (ref >60)
GFR calc non Af Amer: >60 mL/min (ref >60)
Glucose, Bld: 99 mg/dL (ref 70–140)
Potassium: 4.1 mmol/L (ref 3.5–5.1)
Sodium: 139 mmol/L (ref 136–145)
Total Bilirubin: 0.3 mg/dL (ref 0.2–1.2)
Total Protein: 7.5 g/dL (ref 6.4–8.3)

## 2017-08-06 LAB — CBC WITH DIFFERENTIAL/PLATELET
Basophils Absolute: 0 10*3/uL (ref 0.0–0.1)
Basophils Relative: 1 %
Eosinophils Absolute: 0.1 10*3/uL (ref 0.0–0.5)
Eosinophils Relative: 2 %
HCT: 39.3 % (ref 34.8–46.6)
Hemoglobin: 13.1 g/dL (ref 11.6–15.9)
Lymphocytes Relative: 23 %
Lymphs Abs: 1.4 10*3/uL (ref 0.9–3.3)
MCH: 30.8 pg (ref 25.1–34.0)
MCHC: 33.4 g/dL (ref 31.5–36.0)
MCV: 92.1 fL (ref 79.5–101.0)
Monocytes Absolute: 0.5 10*3/uL (ref 0.1–0.9)
Monocytes Relative: 8 %
Neutro Abs: 4.1 10*3/uL (ref 1.5–6.5)
Neutrophils Relative %: 66 %
Platelets: 251 10*3/uL (ref 145–400)
RBC: 4.27 MIL/uL (ref 3.70–5.45)
RDW: 13.7 % (ref 11.2–14.5)
WBC: 6.1 10*3/uL (ref 3.9–10.3)

## 2017-08-07 LAB — CANCER ANTIGEN 27.29: CA 27.29: 204.8 U/mL — ABNORMAL HIGH (ref 0.0–38.6)

## 2017-08-10 MED FILL — HYDROCODON-APAP 5-325: 5-325 | 3 days supply | Qty: 16 | Fill #0

## 2017-08-11 ENCOUNTER — Ambulatory Visit
Admission: RE | Admit: 2017-08-11 | Discharge: 2017-08-11 | Disposition: A | Discharge status: Home / Self Care | Payer: 59 | Source: Ambulatory Visit | Attending: Oncology | Admitting: Oncology

## 2017-08-11 DIAGNOSIS — C50011 Malignant neoplasm of nipple and areola, right female breast: Secondary | ICD-10-CM

## 2017-08-11 DIAGNOSIS — C50012 Malignant neoplasm of nipple and areola, left female breast: Secondary | ICD-10-CM

## 2017-08-11 DIAGNOSIS — K7689 Other specified diseases of liver: Secondary | ICD-10-CM | POA: Diagnosis not present

## 2017-08-11 DIAGNOSIS — Z170 Estrogen receptor positive status [ER+]: Secondary | ICD-10-CM

## 2017-08-11 DIAGNOSIS — C50112 Malignant neoplasm of central portion of left female breast: Secondary | ICD-10-CM

## 2017-08-11 DIAGNOSIS — C50211 Malignant neoplasm of upper-inner quadrant of right female breast: Secondary | ICD-10-CM

## 2017-08-11 MED ORDER — GADOBENATE DIMEGLUMINE 529 MG/ML IV SOLN
20.0000 mL | Freq: Once | INTRAVENOUS | Status: AC | PRN
  Administered 2017-08-11: 20 mL via INTRAVENOUS

## 2017-08-12 ENCOUNTER — Other Ambulatory Visit: Payer: Self-pay | Admitting: Oncology

## 2017-08-12 ENCOUNTER — Ambulatory Visit
Admission: RE | Admit: 2017-08-12 | Discharge: 2017-08-12 | Disposition: A | Discharge status: Home / Self Care | Payer: 59 | Source: Ambulatory Visit | Attending: Oncology | Admitting: Oncology

## 2017-08-12 ENCOUNTER — Other Ambulatory Visit: Payer: Self-pay | Admitting: *Deleted

## 2017-08-12 DIAGNOSIS — C50912 Malignant neoplasm of unspecified site of left female breast: Secondary | ICD-10-CM | POA: Diagnosis not present

## 2017-08-12 DIAGNOSIS — C50112 Malignant neoplasm of central portion of left female breast: Secondary | ICD-10-CM

## 2017-08-12 DIAGNOSIS — C7951 Secondary malignant neoplasm of bone: Secondary | ICD-10-CM

## 2017-08-12 DIAGNOSIS — C50012 Malignant neoplasm of nipple and areola, left female breast: Secondary | ICD-10-CM

## 2017-08-12 DIAGNOSIS — C50911 Malignant neoplasm of unspecified site of right female breast: Secondary | ICD-10-CM

## 2017-08-12 DIAGNOSIS — C50011 Malignant neoplasm of nipple and areola, right female breast: Secondary | ICD-10-CM

## 2017-08-12 DIAGNOSIS — R937 Abnormal findings on diagnostic imaging of other parts of musculoskeletal system: Secondary | ICD-10-CM

## 2017-08-12 DIAGNOSIS — Z170 Estrogen receptor positive status [ER+]: Secondary | ICD-10-CM

## 2017-08-12 DIAGNOSIS — C50211 Malignant neoplasm of upper-inner quadrant of right female breast: Secondary | ICD-10-CM

## 2017-08-12 NOTE — Progress Notes (Signed)
[place]  [phone]) [phone] Fax:(336) [phone]     ID: Debbie [name] DOB: December 17, 1965  MR#: [id number]  BXU#:[id number]  Patient Care Team: Kelton Pillar, MD as PCP - General (Family Medicine) Jacelyn Pi, MD as Consulting Physician (Endocrinology) Leo Grosser Seymour Bars, MD as Consulting Physician (Obstetrics and Gynecology) Jovita Kussmaul, MD as Consulting Physician (General Surgery) Mardee Clune, Virgie Dad, MD as Consulting Physician (Oncology) [name], MD as Attending Physician (Radiation Oncology) Belva Crome, MD as Consulting Physician (Cardiology) Crissie Reese, MD as Consulting Physician (Plastic Surgery) [name], [name], NP as Nurse Practitioner (Hematology and Oncology) PCP: Kelton Pillar, MD OTHER MD:  CHIEF COMPLAINT: Estrogen receptor positive stage IV  breast cancer  CURRENT TREATMENT:  Fulvestrant, palbociclib, denosumab/Xgeva   BREAST CANCER HISTORY:  from the original intake note:  Debbie [name] had screening mammography with tomosynthesis at the [facility] 10/02/2015. This shows 2 possible masses in the right breast as well as some suspicious calcifications. The left breast was unremarkable. On 10/15/2015 she underwent right diagnostic mammography with tomography and right breast ultrasonography. The breast density was category B. Spot magnification views confirmed an area of pleomorphic calcifications measuring up to 5 cm in the upper half of the breast. Also in the upper outer right breast there were adjacent low-density masses without distortion or calcifications. On physical exam there was no palpable abnormality.  Targeted ultrasound of the right breast found the 2 "masses" in the right upper quadrant were benign cysts measuring 0.8 and 0.5 cm respectively. Biopsy of the area of calcification on 10/15/2015 showed ([id number]) invasive ductal carcinoma, E-cadherin positive, grade 2, estrogen receptor 90% positive,  progesterone receptor 50% positive, both with strong staining intensity, with an MIB-1 of 70%, and no HER-2 amplification, the signals ratio being 1.18 and the number per cell 2.60.  Her subsequent history is as detailed below    INTERVAL HISTORY: Debbie [name] returns today for follow-up of her estrogen receptor positive breast cancer. She is accompanied by her husband.    On 08/04/2017 she underwent a CT abdomen and pelvis with contrast, showing scattered areas of lucency within the thoracic and lumbar vertebral bodies highly suspicious for underlying disease given the patient's clinical history.   She proceeded to undergo an MRI abdomen with and without contrast on 08/11/2017, showing numerous similar small enhancing liver masses scattered throughout the liver, with MRI features compatible with hepatic metastases. Innumerable patchy confluent Hansen osseous metastases throughout the visualized thoracolumbar spine. Re-demonstration of 3 small pulmonary nodules in the basilar left lower lobe, better seen on the 08/04/2017 CT abdomen study.   Following on 08/12/2017 she underwent an MRI total spine with and without contrast, showing Cervical Spine: C4 small right articular process metastasis. No extra osseous tumor or fracture. Thoracic Spine: sparing T1 there is metastatic disease in each vertebrae. The largest is at the T11 vertebral body and left pedicle with posterior cortex erosion but no visible extra osseous tumor. Lumbar Spine: four lumbar type vertebral bodies. Metastatic disease at each level, greatest in the L2 body which shows a chronic superior endplate fracture. No evidence of extra osseous tumor.   She is here today to discuss those results   REVIEW OF SYSTEMS: Anntonette is doing well, overall. She has been staying busy with work. She tries to move around as much as she can, but is unable to move too much due to her back pain. Her pain will occasionally radiate to her legs, one leg at a time and  at different times. She will take Advil when needed. She has also used tylenol at times. She denies unusual headaches, visual changes, nausea, vomiting, or dizziness. There has been no unusual cough, phlegm production, or pleurisy. This been no change in bowel or bladder habits. She denies unexplained fatigue or unexplained weight loss, bleeding, rash, or fever. A detailed review of systems was otherwise noncontributory.     PAST MEDICAL HISTORY: Past Medical History:  Diagnosis Date  . Allergy   . Anxiety   . Breast cancer of upper-inner quadrant of right female breast ([place]) 10/17/2015  . Depression    no longer on medication off meds since Feb 2017  . Dysrhythmia   . Elevated cholesterol   . Family history of adverse reaction to anesthesia     mother has Copd was kept on vent   . Headache   . Heart murmur   . History of radiation therapy 02/11/16- 03/27/16   Right Chest Wall 50.4 Gy in 28 fractions, Right Chest Wall Boost 10 Gy in 5 fractions.   . Hypertension   . Hypothyroidism   . Menorrhagia 2011  . Urinary incontinence 2010    PAST SURGICAL HISTORY: Past Surgical History:  Procedure Laterality Date  . BREAST LUMPECTOMY Right 11/28/2015   RIGHT BREAST LUMPECTOMY WITH NEEDLE LOCALIZATION AND AXILLARY SENTINEL LYMPH NODE BX (Right)  . BREAST LUMPECTOMY WITH NEEDLE LOCALIZATION AND AXILLARY SENTINEL LYMPH NODE BX Right 11/28/2015   Procedure: RIGHT BREAST LUMPECTOMY WITH NEEDLE LOCALIZATION AND AXILLARY SENTINEL LYMPH NODE BX;  Surgeon: Autumn Messing III, MD;  Location: [place];  Service: General;  Laterality: Right;  . BREAST REDUCTION SURGERY Bilateral 11/28/2015   Procedure: MAMMARY REDUCTION  (BREAST) BILATERAL;  Surgeon: Crissie Reese, MD;  Location: [place];  Service: Plastics;  Laterality: Bilateral;  . CESAREAN SECTION    . COLONOSCOPY    . DENTAL SURGERY     2005,2013  . MASTECTOMY W/ SENTINEL NODE BIOPSY Bilateral 12/26/2015  . MASTECTOMY W/ SENTINEL NODE BIOPSY Bilateral  12/26/2015   Procedure: BILATERAL MASTECTOMY WITH LEFT SENTINEL LYMPH NODE BIOPSY;  Surgeon: Autumn Messing III, MD;  Location: [place];  Service: General;  Laterality: Bilateral;  . TUBAL LIGATION      FAMILY HISTORY Family History  Problem Relation Age of Onset  . Diabetes Mother   . Hypertension Mother   . Hypertension Father   . Diabetes Maternal Grandmother   . Hypertension Sister   . Cancer Paternal Grandmother        colon  . Colon cancer Paternal Grandmother   . Esophageal cancer Neg Hx   . Stomach cancer Neg Hx   . Rectal cancer Neg Hx   The patient's parents are still living, in their early 38s as of June 2017. The patient has one brother, 2 sisters. On the maternal side there is a history of uterine and prostate cancer. On the paternal side there is a history of colon cancer and possibly uterine cancer. There is no history of breast or ovarian cancer in the family.   GYNECOLOGIC HISTORY:  No LMP recorded.  Menarche age 48. She still having regular periods. She is GX P1, first pregnancy age 1. She used oral contraceptives for a few years remotely, with no complications. She is currently on a Mirena IUD and also is status post bilateral tubal ligation.  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 Her husband [name] is a Administrator. Their son [name] is a  radio DJ. The patient has no grandchildren. She is not a Ambulance person.    ADVANCED DIRECTIVES: Not in place   HEALTH MAINTENANCE: Social History   Tobacco Use  . Smoking status: Never Smoker  . Smokeless tobacco: Never Used  Substance Use Topics  . Alcohol use: No  . Drug use: No     Colonoscopy: January 2016   PAP: January 2016   Bone density: August 2017 showed a T score of +0.7 normal  Lipid panel:  Allergies  Allergen Reactions  . Ace Inhibitors Cough  . Atorvastatin Other (See Comments)    Joint pain  . Simvastatin Other (See Comments)    Joint pain    Current Outpatient  Medications  Medication Sig Dispense Refill  . acetaminophen (TYLENOL) 325 MG tablet Take 650 mg by mouth every 6 (six) hours as needed for mild pain. Reported on 10/23/2015    . Calcium-Magnesium 250-125 MG TABS Take 1 tablet by mouth daily. 120 each 4  . Cetirizine HCl (ZYRTEC ALLERGY PO) Take 1 tablet by mouth daily as needed (allergies).     . hydrochlorothiazide (MICROZIDE) 12.5 MG capsule Take 12.5 mg by mouth daily.    Marland Kitchen levonorgestrel (MIRENA) 20 MCG/24HR IUD 1 each by Intrauterine route once.    Marland Kitchen levothyroxine (SYNTHROID, LEVOTHROID) 125 MCG tablet Take 125 mcg by mouth daily.    Marland Kitchen LIVALO 2 MG TABS Take 0.2 mg by mouth daily.   6  . losartan (COZAAR) 100 MG tablet Take 100 mg by mouth daily.    . metFORMIN (GLUCOPHAGE-XR) 750 MG 24 hr tablet Take 750 mg by mouth every evening.   6  . MULTIPLE MINERALS PO Take by mouth.    . omega-3 acid ethyl esters (LOVAZA) 1 g capsule Take 1 g by mouth 2 (two) times daily.    . potassium chloride SA (K-DUR,KLOR-CON) 20 MEQ tablet Take 40 mEq by mouth 2 (two) times daily.     . tamoxifen (NOLVADEX) 20 MG tablet Take 1 tablet (20 mg total) by mouth daily. 90 tablet 12  . traMADol (ULTRAM) 50 MG tablet Take 1 tablet (50 mg total) by mouth every 6 (six) hours as needed. 60 tablet 3  . Vitamin D, Cholecalciferol, 1000 units CAPS Take 1 tablet by mouth daily. 90 capsule 4   No current facility-administered medications for this visit.     OBJECTIVE: Middle-aged African-American woman who was tearful during today's visit  Vitals:   08/13/17 1125  BP: (!) 169/96  Pulse: (!) 110  Resp: 18  Temp: 98.3 F (36.8 C)     Body mass index is 42.93 kg/m.    ECOG FS:1 - Symptomatic but completely ambulatory  Sclerae unicteric, EOMs intact Oropharynx clear and moist No cervical or supraclavicular adenopathy Lungs no rales or rhonchi Heart regular rate and rhythm Abd soft, nontender, positive bowel sounds MSK no focal spinal tenderness, no upper  extremity lymphedema Neuro: nonfocal, well oriented, appropriate affect Breasts: Status post bilateral mastectomies.  There is no evidence of chest wall recurrence.  Both axillae are benign.  LAB RESULTS:  CMP     Component Value Date/Time   NA 139 08/06/2017 1052   NA 140 07/08/2016 0948   K 4.1 08/06/2017 1052   K 4.1 07/08/2016 0948   CL 105 08/06/2017 1052   CO2 24 08/06/2017 1052   CO2 24 07/08/2016 0948   GLUCOSE 99 08/06/2017 1052   GLUCOSE 89 07/08/2016 0948   BUN 13 08/06/2017 1052  BUN 11.9 07/08/2016 0948   CREATININE 0.86 08/06/2017 1052   CREATININE 0.90 06/22/2017 1147   CREATININE 0.9 07/08/2016 0948   CALCIUM 10.0 08/06/2017 1052   CALCIUM 10.0 07/08/2016 0948   PROT 7.5 08/06/2017 1052   PROT 7.6 07/08/2016 0948   ALBUMIN 3.6 08/06/2017 1052   ALBUMIN 3.6 07/08/2016 0948   AST 34 08/06/2017 1052   AST 31 06/22/2017 1147   AST 22 07/08/2016 0948   ALT 48 08/06/2017 1052   ALT 45 06/22/2017 1147   ALT 27 07/08/2016 0948   ALKPHOS 127 08/06/2017 1052   ALKPHOS 115 07/08/2016 0948   BILITOT 0.3 08/06/2017 1052   BILITOT 0.4 06/22/2017 1147   BILITOT 0.35 07/08/2016 0948   GFRNONAA >60 08/06/2017 1052   GFRNONAA >60 06/22/2017 1147   GFRAA >60 08/06/2017 1052   GFRAA >60 06/22/2017 1147    INo results found for: SPEP, UPEP  Lab Results  Component Value Date   WBC 6.1 08/06/2017   NEUTROABS 4.1 08/06/2017   HGB 13.1 08/06/2017   HCT 39.3 08/06/2017   MCV 92.1 08/06/2017   PLT 251 08/06/2017      Chemistry      Component Value Date/Time   NA 139 08/06/2017 1052   NA 140 07/08/2016 0948   K 4.1 08/06/2017 1052   K 4.1 07/08/2016 0948   CL 105 08/06/2017 1052   CO2 24 08/06/2017 1052   CO2 24 07/08/2016 0948   BUN 13 08/06/2017 1052   BUN 11.9 07/08/2016 0948   CREATININE 0.86 08/06/2017 1052   CREATININE 0.90 06/22/2017 1147   CREATININE 0.9 07/08/2016 0948      Component Value Date/Time   CALCIUM 10.0 08/06/2017 1052   CALCIUM  10.0 07/08/2016 0948   ALKPHOS 127 08/06/2017 1052   ALKPHOS 115 07/08/2016 0948   AST 34 08/06/2017 1052   AST 31 06/22/2017 1147   AST 22 07/08/2016 0948   ALT 48 08/06/2017 1052   ALT 45 06/22/2017 1147   ALT 27 07/08/2016 0948   BILITOT 0.3 08/06/2017 1052   BILITOT 0.4 06/22/2017 1147   BILITOT 0.35 07/08/2016 0948       No results found for: LABCA2  No components found for: LABCA125  No results for input(s): INR in the last 168 hours.  Urinalysis No results found for: COLORURINE, APPEARANCEUR, LABSPEC, [place], GLUCOSEU, HGBUR, BILIRUBINUR, KETONESUR, PROTEINUR, UROBILINOGEN, NITRITE, LEUKOCYTESUR   STUDIES: Ct Abdomen Pelvis W Contrast  Result Date: 08/04/2017 CLINICAL DATA:  History of breast carcinoma with abdominal pain and bloating EXAM: CT ABDOMEN AND PELVIS WITH CONTRAST TECHNIQUE: Multidetector CT imaging of the abdomen and pelvis was performed using the standard protocol following bolus administration of intravenous contrast. CONTRAST:  119m ISOVUE-300 IOPAMIDOL (ISOVUE-300) INJECTION 61% COMPARISON:  None. FINDINGS: Lower chest: Lung bases are well aerated. A 6 mm nodule is noted in the left lower lobe best seen on image number 36 of series 5. No other nodules are seen. Hepatobiliary: Diffuse decreased attenuation in the liver is noted with a few small areas of relative sparing. The largest of these lies in the right lobe best seen on image number 18 of series 2 measuring 12.5 mm. Scattered smaller similar areas are noted. Pancreas: Unremarkable. No pancreatic ductal dilatation or surrounding inflammatory changes. Spleen: Normal in size without focal abnormality. Adrenals/Urinary Tract: Adrenal glands are unremarkable. Kidneys are normal, without renal calculi, focal lesion, or hydronephrosis. Bladder is decompressed. Stomach/Bowel: Stomach is within normal limits. Appendix appears normal. No evidence  of bowel wall thickening, distention, or inflammatory changes.  Vascular/Lymphatic: No significant vascular findings are present. No enlarged abdominal or pelvic lymph nodes. Reproductive: An IUD is noted in place. Somewhat peripherally enhancing hypodensity is noted within the uterus to the right of the midline. This likely represents a uterine fibroid. Pelvic ultrasound may be helpful for further clarification. Other: No abdominal wall hernia or abnormality. No abdominopelvic ascites. Musculoskeletal: The bony structures demonstrates some scattered lucencies within the lumbar spine and thoracic spine. No prior areas is available for comparison. This appearance suggests underlying metastatic disease. IMPRESSION: Scattered areas of lucency within the thoracic and lumbar vertebral bodies highly suspicious for underlying metastatic disease given the patient's clinical history. MRI with contrast is recommended for further evaluation. 6 mm left lower lobe nodule as described. Non-contrast chest CT at 6-12 months is recommended. If the nodule is stable at time of repeat CT, then future CT at 18-24 months (from today's scan) is considered optional for low-risk patients, but is recommended for high-risk patients. This recommendation follows the consensus statement: Guidelines for Management of Incidental Pulmonary Nodules Detected on CT Images: From the Fleischner Society 2017; Radiology 2017; 284:228-243. Fatty infiltration of the liver with areas of enhancement identified primarily within the right lobe of the liver. Given the findings in the bony structures and the clinical history these may represent metastatic lesions. MRI of the abdomen with contrast is recommended as well for further evaluation. These results will be called to the ordering clinician or representative by the Radiologist Assistant, and communication documented in the PACS or zVision Dashboard. Electronically Signed   By: Inez Catalina M.D.   On: 08/04/2017 14:21   Mr Liver W WO Contrast  Result Date:  08/12/2017 CLINICAL DATA:  Right breast cancer. Staging evaluation. Abdominal pain and bloating. Suspected liver masses on recent CT study. EXAM: MRI ABDOMEN WITHOUT AND WITH CONTRAST TECHNIQUE: Multiplanar multisequence MR imaging of the abdomen was performed both before and after the administration of intravenous contrast. CONTRAST:  23m MULTIHANCE GADOBENATE DIMEGLUMINE 529 MG/ML IV SOLN COMPARISON:  08/04/2017 CT abdomen/pelvis. FINDINGS: Lower chest: Redemonstrated are 3 basilar left lower lobe pulmonary nodules measuring up to 5 mm (series 5/image 11). Hepatobiliary: Normal liver size and configuration. Moderate to severe diffuse hepatic steatosis. There are numerous (greater than 20) similar-appearing small liver masses scattered throughout the liver, each demonstrating mild T2 hyperintensity and heterogeneous targetoid enhancement, compatible with liver metastases. Representative liver masses include a 1.8 x 1.7 cm segment 7 right liver lobe mass (series 5/image 12), a 1.4 x 1.3 cm segment 6 right liver lobe mass (series 5/image 24) and a 1.1 x 0.9 cm anterior segment 8 right liver lobe mass (series 5/image 15). Normal gallbladder with no cholelithiasis. No biliary ductal dilatation. Common bile duct diameter 3 mm. No choledocholithiasis. Pancreas: No pancreatic mass or duct dilation.  No pancreas divisum. Spleen: Normal size. No mass. Adrenals/Urinary Tract: Normal adrenals. No hydronephrosis. Small simple parapelvic renal cysts in the left kidney. No suspicious renal masses. Stomach/Bowel: Grossly normal stomach. Visualized small and large bowel is normal caliber, with no bowel wall thickening. Vascular/Lymphatic: Normal caliber abdominal aorta. Patent portal, splenic, hepatic and renal veins. No pathologically enlarged lymph nodes in the abdomen. Other: No abdominal ascites or focal fluid collection. Musculoskeletal: There are innumerable patchy confluent enhancing osseous lesions throughout the  visualized thoracolumbar spine. IMPRESSION: 1. Numerous (greater than 20) similar small enhancing liver masses scattered throughout the liver, with MRI features compatible with hepatic metastases. 2. Innumerable  patchy confluent Hansen osseous metastases throughout the visualized thoracolumbar spine. 3. Redemonstration of 3 small pulmonary nodules in the basilar left lower lobe, better seen on the 08/04/2017 CT abdomen study. Small pulmonary metastases not excluded. Recommend attention on short-term chest CT follow-up in 3 months. 4. Diffuse hepatic steatosis. Electronically Signed   By: Ilona Sorrel M.D.   On: 08/12/2017 09:38   Mr Total Spine Mets Screening  Result Date: 08/13/2017 CLINICAL DATA:  Invasive breast cancer.  Screening spinal MRI. EXAM: MRI TOTAL SPINE WITHOUT AND WITH CONTRAST TECHNIQUE: Multisequence MR imaging of the spine from the cervical spine to the sacrum was performed prior to and following IV contrast administration for evaluation of spinal metastatic disease. CONTRAST:  41m MULTIHANCE GADOBENATE DIMEGLUMINE 529 MG/ML IV SOLN COMPARISON:  None. FINDINGS: MRI CERVICAL SPINE FINDINGS Alignment: Straightening. Slight degenerative retrolisthesis at C5-6. Vertebrae: Right C4 articular process lesion. Negative for fracture. Cord: Normal signal and morphology Posterior Fossa, vertebral arteries, paraspinal tissues: No visualized mass. Disc levels: Disc narrowing and bulging C3-4 to C5-6. No visible cord impingement. No notable facet arthropathy. MRI THORACIC SPINE FINDINGS Alignment:  Normal Vertebrae: Sparing T1 there is metastatic disease in each vertebra. The T4 deposit is small and at the spinous process. The largest is a confluent metastasis in the T11 body and left pedicle eroding the posterior cortex without visible epidural tumor. Negative for fracture. Cord: Normal signal and morphology. No evidence of intrathecal metastasis. Paraspinal and other soft tissues: Negative Disc levels: No  visible degenerative impingement. MRI LUMBAR SPINE FINDINGS Segmentation: When numbered from above there are 4 lumbar type vertebral bodies. On a 2006 chest x-ray there are bilateral cervical ribs and 11 seen thoracic ribs. Alignment:  Normal Vertebrae: Metastatic lesions throughout each vertebrae, most extensive in the L2 body where there is a superior endplate fracture that is chronic appearing based on STIR appearance. No visible epidural tumor. Conus medullaris: Extends to the L1-2 level and appears normal. Paraspinal and other soft tissues: Recent abdominal CT. No new finding. Disc levels: Mid lower lumbar disc bulging. L5-S1 degenerative facet spurring. No degenerative impingement. IMPRESSION: Cervical spine: C4 small right articular process metastasis. No extra osseous tumor or fracture. Thoracic spine: 1. Sparing T1 there is metastatic disease in each vertebrae. The largest is at the T11 vertebral body and left pedicle with posterior cortex erosion but no visible extra osseous tumor. 2. Negative for fracture. Lumbar spine: 1. Four lumbar type vertebral bodies. 2. Metastatic disease at each level, greatest in the L2 body which shows a chronic superior endplate fracture. No evidence of extra osseous tumor. Electronically Signed   By: JMonte FantasiaM.D.   On: 08/13/2017 10:21    ELIGIBLE FOR AVAILABLE RESEARCH PROTOCOL: P[place], but inelegible because of Mirena IUD  ASSESSMENT: 52y.o. [place] woman status post right breast upper inner quadrant biopsy 10/15/2015 for a clinical T2-T3 N0, stage II invasive ductal carcinoma, estrogen and progesterone receptor positive, HER-2 nonamplified, with an Mib-1 of 70%  (1) status post right lumpectomy and sentinel lymph node sampling with oncoplastic breast reduction 11/28/2015 for a pT2 pN1, stage IIB invasive ductal carcinoma, grade 1, with negative margins (1/1 sentinel node positive)  (a) reclassified as stage IIA in the 2018 prognostic revision   (2)  left reduction mammoplasty 11/28/2015 unexpectedly found a pT1c pNX invasive ductal carcinoma, grade 1, estrogen and progesterone receptor positive, HER-2 not amplified, with an MIB-1 of 3%  (3) Mammaprint from the right sided breast cancer was "low risk", predicting  a 98% chance of no disease recurrence within 5 years with anti-estrogens as the only systemic therapy. It also predicts minimal to no benefit from chemotherapy  (4) status post bilateral mastectomies with bilateral sentinel lymph node sampling 12/26/2015, showing  (a) on the right, no residual carcinoma (0/3 nodes involved)  (b) on the left, focal ductal carcinoma in situ, 0.2 cm, with negative margins; (0/4 nodes involved)  (5) adjuvant radiation 02/11/16 - 03/27/16    1) Right chest wall: 50.4 Gy in 28 fractions               2) Right chest wall boost: 10 Gy in 5 fractions   (6) started on tamoxifen neoadjuvantly 10/23/2015 to allow for expected delays in definitive local treatment  (a) Mirena IUD in place  (b) tamoxifen discontinued 08/13/2017, with progression  (7) since she had 4 lymph nodes removed from each axilla but also receive radiation on the right, lab draws should preferentially be obtained from the left arm   METASTATIC DISEASE: April 2019 (8) CT scan of the abdomen and pelvis 08/04/2017, MRI of the liver and total spine MRI 08/11/2017 and CT scan of the chest and brain on 08/13/2017 showed liver and bone metastases  (a) liver biopsy pending  (9) fulvestrant to start 08/17/2017  (a) palbociclib to start 08/31/2017  (10) denosumab/Xgeva to start 09/14/2017  PLAN: I spent close to an hour with Lanelle Bal and her husband today going over her change situation.  They understand that she now has stage IV, metastatic breast cancer.  It involves the bone and liver, possibly other areas, with a CT of the chest and brain pending later today.  We discussed the fact that stage IV breast cancer unfortunately is not curable  with our current knowledge base.  It is however very treatable.  In her case we have the option of other antiestrogen manipulations as opposed to chemotherapy.  She very clearly would prefer antiestrogens and that is also my recommendation at this point.  We do have to document that her liver lesions are still estrogen receptor positive and HER-2 negative.  I have scheduled her for liver biopsy within the next week or so to document that  Assuming that is the case her treatment will be Faslodex and palbociclib.  Today we discussed the possible toxicities side effects and complications of those agents and that information was given to her in writing.  I am going to start the Faslodex next week in the palbociclib hopefully 2 weeks after that  She also will be on denosumab/Xgeva.  However she just had a tooth extracted.  Accordingly I am going to delay the denosumab until 09/14/2017  She will need to have her Mirena removed.  I will see her again in about 2 weeks to make sure she is tolerating the Faslodex well and to answer any additional questions she may have  Oluwatosin Bracy, Virgie Dad, MD  08/13/17 1:50 PM Medical Oncology and Hematology [facility], [place] [address] Tel. [phone]    Fax. [phone]  This document serves as a record of services personally performed by Chauncey Cruel, MD. It was created on his behalf by Margit Banda, a trained medical scribe. The creation of this record is based on the scribe's personal observations and the provider's statements to them.   I have reviewed the above documentation for accuracy and completeness, and I agree with the above.

## 2017-08-13 ENCOUNTER — Telehealth: Payer: Self-pay | Admitting: Oncology

## 2017-08-13 ENCOUNTER — Inpatient Hospital Stay: Payer: 59 | Attending: Oncology | Admitting: Oncology

## 2017-08-13 ENCOUNTER — Encounter: Payer: Self-pay | Admitting: Oncology

## 2017-08-13 ENCOUNTER — Ambulatory Visit
Admission: RE | Admit: 2017-08-13 | Discharge: 2017-08-13 | Disposition: A | Discharge status: Home / Self Care | Payer: 59 | Source: Ambulatory Visit | Attending: Oncology | Admitting: Oncology

## 2017-08-13 VITALS — BP 169/96 | HR 110 | Temp 98.3°F | Resp 18 | Ht 65.0 in | Wt 258.0 lb

## 2017-08-13 DIAGNOSIS — C7951 Secondary malignant neoplasm of bone: Secondary | ICD-10-CM | POA: Insufficient documentation

## 2017-08-13 DIAGNOSIS — C50211 Malignant neoplasm of upper-inner quadrant of right female breast: Secondary | ICD-10-CM | POA: Diagnosis not present

## 2017-08-13 DIAGNOSIS — G893 Neoplasm related pain (acute) (chronic): Secondary | ICD-10-CM

## 2017-08-13 DIAGNOSIS — C50011 Malignant neoplasm of nipple and areola, right female breast: Secondary | ICD-10-CM

## 2017-08-13 DIAGNOSIS — Z170 Estrogen receptor positive status [ER+]: Secondary | ICD-10-CM | POA: Diagnosis not present

## 2017-08-13 DIAGNOSIS — C50112 Malignant neoplasm of central portion of left female breast: Secondary | ICD-10-CM

## 2017-08-13 DIAGNOSIS — C50811 Malignant neoplasm of overlapping sites of right female breast: Secondary | ICD-10-CM

## 2017-08-13 DIAGNOSIS — C50012 Malignant neoplasm of nipple and areola, left female breast: Secondary | ICD-10-CM

## 2017-08-13 DIAGNOSIS — C50812 Malignant neoplasm of overlapping sites of left female breast: Secondary | ICD-10-CM

## 2017-08-13 DIAGNOSIS — R918 Other nonspecific abnormal finding of lung field: Secondary | ICD-10-CM | POA: Insufficient documentation

## 2017-08-13 DIAGNOSIS — Z79899 Other long term (current) drug therapy: Secondary | ICD-10-CM | POA: Diagnosis not present

## 2017-08-13 DIAGNOSIS — Z9013 Acquired absence of bilateral breasts and nipples: Secondary | ICD-10-CM | POA: Diagnosis not present

## 2017-08-13 DIAGNOSIS — C50919 Malignant neoplasm of unspecified site of unspecified female breast: Secondary | ICD-10-CM | POA: Diagnosis not present

## 2017-08-13 DIAGNOSIS — C787 Secondary malignant neoplasm of liver and intrahepatic bile duct: Secondary | ICD-10-CM | POA: Diagnosis not present

## 2017-08-13 DIAGNOSIS — Z923 Personal history of irradiation: Secondary | ICD-10-CM | POA: Diagnosis not present

## 2017-08-13 DIAGNOSIS — K760 Fatty (change of) liver, not elsewhere classified: Secondary | ICD-10-CM

## 2017-08-13 MED ORDER — TRAMADOL HCL 50 MG PO TABS: 50.0000 mg | ORAL_TABLET | Freq: Four times a day (QID) | ORAL | 3 refills | Status: DC | PRN

## 2017-08-13 MED ORDER — PALBOCICLIB 125 MG PO CAPS: 125.0000 mg | ORAL_CAPSULE | Freq: Every day | ORAL | 6 refills | Status: DC

## 2017-08-13 MED ORDER — IOPAMIDOL (ISOVUE-300) INJECTION 61%
75.0000 mL | Freq: Once | INTRAVENOUS | Status: AC | PRN
  Administered 2017-08-13: 75 mL via INTRAVENOUS

## 2017-08-13 MED FILL — traMADol HCL 50 MG TABS: 50 | 15 days supply | Qty: 60 | Fill #0

## 2017-08-13 NOTE — Telephone Encounter (Signed)
Gave patient AVs and calendar of upcoming April through October appointments.  °

## 2017-08-16 ENCOUNTER — Other Ambulatory Visit: Payer: Self-pay | Admitting: Oncology

## 2017-08-17 ENCOUNTER — Telehealth: Payer: Self-pay | Admitting: Pharmacist

## 2017-08-17 ENCOUNTER — Inpatient Hospital Stay: Payer: 59

## 2017-08-17 DIAGNOSIS — C7951 Secondary malignant neoplasm of bone: Secondary | ICD-10-CM | POA: Diagnosis not present

## 2017-08-17 DIAGNOSIS — Z923 Personal history of irradiation: Secondary | ICD-10-CM | POA: Diagnosis not present

## 2017-08-17 DIAGNOSIS — Z9013 Acquired absence of bilateral breasts and nipples: Secondary | ICD-10-CM | POA: Diagnosis not present

## 2017-08-17 DIAGNOSIS — C50012 Malignant neoplasm of nipple and areola, left female breast: Secondary | ICD-10-CM

## 2017-08-17 DIAGNOSIS — C787 Secondary malignant neoplasm of liver and intrahepatic bile duct: Secondary | ICD-10-CM | POA: Diagnosis not present

## 2017-08-17 DIAGNOSIS — Z170 Estrogen receptor positive status [ER+]: Secondary | ICD-10-CM

## 2017-08-17 DIAGNOSIS — R918 Other nonspecific abnormal finding of lung field: Secondary | ICD-10-CM | POA: Diagnosis not present

## 2017-08-17 DIAGNOSIS — C50211 Malignant neoplasm of upper-inner quadrant of right female breast: Secondary | ICD-10-CM

## 2017-08-17 DIAGNOSIS — Z79899 Other long term (current) drug therapy: Secondary | ICD-10-CM | POA: Diagnosis not present

## 2017-08-17 DIAGNOSIS — C50112 Malignant neoplasm of central portion of left female breast: Secondary | ICD-10-CM

## 2017-08-17 DIAGNOSIS — C50011 Malignant neoplasm of nipple and areola, right female breast: Secondary | ICD-10-CM

## 2017-08-17 LAB — CBC WITH DIFFERENTIAL/PLATELET
Basophils Absolute: 0 10*3/uL (ref 0.0–0.1)
Basophils Relative: 0 %
Eosinophils Absolute: 0.2 10*3/uL (ref 0.0–0.5)
Eosinophils Relative: 2 %
HCT: 40.2 % (ref 34.8–46.6)
Hemoglobin: 13.4 g/dL (ref 11.6–15.9)
Lymphocytes Relative: 20 %
Lymphs Abs: 1.4 10*3/uL (ref 0.9–3.3)
MCH: 31.2 pg (ref 25.1–34.0)
MCHC: 33.3 g/dL (ref 31.5–36.0)
MCV: 93.7 fL (ref 79.5–101.0)
Monocytes Absolute: 0.5 10*3/uL (ref 0.1–0.9)
Monocytes Relative: 8 %
Neutro Abs: 4.7 10*3/uL (ref 1.5–6.5)
Neutrophils Relative %: 70 %
Platelets: 272 10*3/uL (ref 145–400)
RBC: 4.29 MIL/uL (ref 3.70–5.45)
RDW: 13.4 % (ref 11.2–14.5)
WBC: 6.8 10*3/uL (ref 3.9–10.3)

## 2017-08-17 LAB — COMPREHENSIVE METABOLIC PANEL
ALT: 52 U/L (ref 0–55)
AST: 44 U/L — ABNORMAL HIGH (ref 5–34)
Albumin: 3.7 g/dL (ref 3.5–5.0)
Alkaline Phosphatase: 135 U/L (ref 40–150)
Anion gap: 9 (ref 3–11)
BUN: 12 mg/dL (ref 7–26)
CO2: 26 mmol/L (ref 22–29)
Calcium: 9.9 mg/dL (ref 8.4–10.4)
Chloride: 105 mmol/L (ref 98–109)
Creatinine, Ser: 0.94 mg/dL (ref 0.60–1.10)
GFR calc Af Amer: >60 mL/min (ref >60)
GFR calc non Af Amer: >60 mL/min (ref >60)
Glucose, Bld: 104 mg/dL (ref 70–140)
Potassium: 4.2 mmol/L (ref 3.5–5.1)
Sodium: 140 mmol/L (ref 136–145)
Total Bilirubin: 0.3 mg/dL (ref 0.2–1.2)
Total Protein: 7.5 g/dL (ref 6.4–8.3)

## 2017-08-17 MED ORDER — PALBOCICLIB 125 MG PO CAPS: 125.0000 mg | ORAL_CAPSULE | Freq: Every day | ORAL | 6 refills | Status: DC

## 2017-08-17 MED ORDER — FULVESTRANT 250 MG/5ML IM SOLN
500.0000 mg | Freq: Once | INTRAMUSCULAR | Status: AC
  Administered 2017-08-17: 500 mg via INTRAMUSCULAR

## 2017-08-17 NOTE — Telephone Encounter (Signed)
Oral Oncology Pharmacist Encounter  Received new prescription for Ibrance (palbociclib) for the treatment of metastatic, hormone receptor positive breast cancer in conjunction with Faslodex injections, planned duration until disease progression or unacceptable toxicity.  Labs from 08/06/17 assessed, OK for treatment.  Current medication list in Epic reviewed, no DDIs with Ibrance identified.  Prescription has been e-scribed to the [facility] for benefits analysis and approval. Insurance authorization has been submitted, is pending  Oral Oncology Clinic will continue to follow for insurance authorization, copayment issues, initial counseling and start date.  Johny Drilling, PharmD, BCPS, BCOP 08/17/2017 10:15 AM Oral Oncology Clinic [phone]

## 2017-08-17 NOTE — Patient Instructions (Signed)

## 2017-08-17 NOTE — Telephone Encounter (Signed)
Oral Oncology Pharmacist Encounter  Attempted to submit for insurance authorization for Ibrance on [email or web address] Unable to complete request  I called [employer] at [phone] submitted for insurance authorization of Ibrance  Prior authorization request submitted over the phone Standard turnaround time is 72 hours, status is pending  PA reference# [id number] will continue to follow.  Johny Drilling, PharmD, BCPS, BCOP 08/17/2017 10:27 AM Oral Oncology Clinic [phone]

## 2017-08-18 ENCOUNTER — Telehealth: Payer: Self-pay | Admitting: *Deleted

## 2017-08-18 MED ORDER — PALBOCICLIB 125 MG PO CAPS: 125.0000 mg | ORAL_CAPSULE | Freq: Every day | ORAL | 6 refills | Status: DC

## 2017-08-18 MED FILL — LEVOTHYROXINE 137 MCG TABLE: 137 | 90 days supply | Qty: 90 | Fill #0

## 2017-08-18 NOTE — Telephone Encounter (Signed)
Oral Oncology Pharmacist Encounter  I called [employer] prescription insurance to follow-up on patient's Ibrance prior authorization. Status remains pending.  I inquired if they needed any additional information from the office to complete the request, customer service representative Jeneen Rinks) told me that there was nothing new needed at this time. He stated that the application was still under review.  They are not able to offer any additional information at this time.  Oral oncology clinic will continue to follow up with patient's insurance coverage until determination of authorization request.  Oral Oncology Clinic will continue to follow.  Johny Drilling, PharmD, BCPS, BCOP 08/18/2017 4:28 PM Oral Oncology Clinic [phone]

## 2017-08-18 NOTE — Telephone Encounter (Signed)
This RN received call from Dr Vernard Gambles - dentist of pt.  She wanted to verify pt's current dental status in regards to needed dental work vs further extractions.  Dr Hardin Negus states pt has several teeth that needs fillings - and if fillings are appropriate with pt's treatment plan for her stage 4 breast cancer  " otherwise we could pull these teeth ".  Per discussion - and review of MD notes and orders - pt's treatment currently does not interfere with dental fillings or cleanings.  Plan is to add Xgeva for known bone mets.  Per end of discussion plan is to proceed with dental fillings ( ideally saving teeth is better overall ) - MD may initiate Xgeva.  If further dental decline noted with in 6 months then Xgeva can be held if extractions are warranted.  Pt is scheduled for MD visit on 4/23 and above can be further discussed.

## 2017-08-18 NOTE — Telephone Encounter (Signed)
Oral Chemotherapy Pharmacist Encounter   I spoke with patient on 08/17/17 for overview of: Ibrance.   Pt is doing well. Counseled patient on administration, dosing, side effects, monitoring, drug-food interactions, safe handling, storage, and disposal.  Patient will take Ibrance 125mg  capsules, 1 capsule by mouth once daily with lunch, for 3 weeks on, 1 week off.  Patient instructed to take Ibrance with whole food.   Prescription was previously written for breath patient to take daily with breakfast.   After further discussion, patient eats lunch on a more regular schedule and will take her Ibrance with the middle meal of the day. Prescription has been updated at the pharmacy.  Patient knows to avoid grapefruit and grapefruit juice.  Ibrance start date: 08/31/17  Side effects include but not limited to: fatigue, hair loss, GI upset, nausea, decreased blood counts, and increased upper respiratory infections.  Reviewed with patient importance of keeping a medication schedule and plan for any missed doses.  Ms. Delprado voiced understanding and appreciation.   All questions answered. Medication reconciliation performed and medication/allergy list updated.  Patient informed that insurance authorization has been submitted and remains pending.  Patient understands that her Leslee Home will be dispensed from the [facility] per insurance requirement.  Oral oncology clinic will follow up with the patient once insurance authorization is obtained and copayment is known. At that time we will arrange pickup date for Ibrance from the pharmacy.  Patient knows to call the office with questions or concerns. Oral Oncology Clinic will continue to follow.  Thank you,  Johny Drilling, PharmD, BCPS, BCOP 08/18/2017   3:36 PM Oral Oncology Clinic [phone]

## 2017-08-20 NOTE — Telephone Encounter (Signed)
Oral Oncology Patient Advocate Encounter  Received fax from [place] requesting that supporting information be submitted in order for this prior authorization to be processed further.    Clinical questions were answered.  Surgical pathology report as well as chart notes were submitted to [employer] for review.    Fax confirmation was received.  Request marked urgent.    Fabio Asa. Melynda Keller, [place] Patient [place] [phone] 08/20/2017 1:17 PM

## 2017-08-24 NOTE — Telephone Encounter (Signed)
Oral Oncology Patient Advocate Encounter  Contacted [employer] to continue the follow up on the prior authorization request for Ibrance.   The status is remains as pending.   I, again, requested that the authorization be processed urgently.  It was suggested that I reach out to them tomorrow afternoon if an approval had not been received by noon.    Debbie [name]. Melynda Keller, [place] Patient [place] [phone] 08/24/2017 2:29 PM

## 2017-08-26 ENCOUNTER — Ambulatory Visit
Admission: RE | Admit: 2017-08-26 | Discharge: 2017-08-26 | Disposition: A | Discharge status: Home / Self Care | Payer: 59 | Source: Ambulatory Visit | Attending: Oncology | Admitting: Oncology

## 2017-08-26 ENCOUNTER — Other Ambulatory Visit: Payer: 59

## 2017-08-26 DIAGNOSIS — C50919 Malignant neoplasm of unspecified site of unspecified female breast: Secondary | ICD-10-CM | POA: Diagnosis not present

## 2017-08-26 DIAGNOSIS — R42 Dizziness and giddiness: Secondary | ICD-10-CM | POA: Diagnosis not present

## 2017-08-26 DIAGNOSIS — C50112 Malignant neoplasm of central portion of left female breast: Secondary | ICD-10-CM

## 2017-08-26 DIAGNOSIS — C50211 Malignant neoplasm of upper-inner quadrant of right female breast: Secondary | ICD-10-CM

## 2017-08-26 DIAGNOSIS — Z170 Estrogen receptor positive status [ER+]: Secondary | ICD-10-CM

## 2017-08-26 MED ORDER — IOPAMIDOL (ISOVUE-300) INJECTION 61%
75.0000 mL | Freq: Once | INTRAVENOUS | Status: AC | PRN
  Administered 2017-08-26: 75 mL via INTRAVENOUS

## 2017-08-26 MED FILL — IBRANCE 125 MG CAPSULE: 125 | 28 days supply | Qty: 21 | Fill #0

## 2017-08-27 NOTE — Telephone Encounter (Signed)
Oral Oncology Patient Advocate Encounter  Prior Authorization for Leslee Home has been approved.    PA# [id number]  Effective dates: 08/16/2017 through 08/26/2018  Oral Oncology Clinic will continue to follow.  Debbie [name]. Melynda Keller, [place] Patient [place] [phone] 08/27/2017 9:02 AM

## 2017-08-30 NOTE — Progress Notes (Signed)
[place]  [phone]) [phone] [phone]) [phone]     ID: Debbie [name] DOB: 23-Jan-1966  MR#: [id number]  ZSM#:[id number]  Patient Care Team: Kelton Pillar, MD as PCP - General (Family Medicine) Jacelyn Pi, MD as Consulting Physician (Endocrinology) Leo Grosser Seymour Bars, MD as Consulting Physician (Obstetrics and Gynecology) Jovita Kussmaul, MD as Consulting Physician (General Surgery) Idell Hissong, Virgie Dad, MD as Consulting Physician (Oncology) [name], MD as Attending Physician (Radiation Oncology) Belva Crome, MD as Consulting Physician (Cardiology) Crissie Reese, MD as Consulting Physician (Plastic Surgery) [name], [name], NP as Nurse Practitioner (Hematology and Oncology) PCP: Kelton Pillar, MD OTHER MD:  CHIEF COMPLAINT: Estrogen receptor positive stage IV  breast cancer  CURRENT TREATMENT:  Fulvestrant, palbociclib, denosumab/Xgeva   BREAST CANCER HISTORY:  from the original intake note:  Debbie [name] had screening mammography with tomosynthesis at [facility] 10/02/2015. This shows 2 possible masses in the right breast as well as some suspicious calcifications. The left breast was unremarkable. On 10/15/2015 she underwent right diagnostic mammography with tomography and right breast ultrasonography. The breast density was category B. Spot magnification views confirmed an area of pleomorphic calcifications measuring up to 5 cm in the upper half of the breast. Also in the upper outer right breast there were adjacent low-density masses without distortion or calcifications. On physical exam there was no palpable abnormality.  Targeted ultrasound of the right breast found the 2 "masses" in the right upper quadrant were benign cysts measuring 0.8 and 0.5 cm respectively. Biopsy of the area of calcification on 10/15/2015 showed ([id number]) invasive ductal carcinoma, E-cadherin positive, grade 2, estrogen receptor 90% positive,  progesterone receptor 50% positive, both with strong staining intensity, with an MIB-1 of 70%, and no HER-2 amplification, the signals ratio being 1.18 and the number per cell 2.60.  Her subsequent history is as detailed below    INTERVAL HISTORY: Debbie [name] returns today for follow-up of her estrogen receptor positive stage IV breast cancer. She is doing well overall. She notes that she hasn't been called as of yet for her liver biopsy.   She notes that she obtained her initial injection of falsodex on 08/17/2017 and she is due for another injection today. She noted that the injection was painful to the touch for about 3-4 days, however, she was able to walk around the mall and continue working without issues. She also reports that she had malodorous urine.   She will pick up her Ibrance prescription today. She is on for 21 days and off for 7 days on this medication.     Since her last visit to the office, she underwent CT chest with contrast on 08/13/2017 with results showing: Multiple bilateral pulmonary nodules, most consistent with metastatic disease. Significant right infrahilar adenopathy is likely due to metastasis. Borderline low right mediastinal adenopathy is indeterminate. Hepatic steatosis.  Hepatic metastasis are more apparent on MRI. Tiny hiatal hernia. Osseous metastasis.  She then had a CT head completed on 08/26/2017 with results showing: No acute intracranial process. Empty sella, otherwise unremarkable noncontrast CT HEAD for age.    REVIEW OF SYSTEMS: Debbie [name] reports that for exercise, she is working full time. She notes that her appetite has improved since her initial diagnosis. She notes that she sweats a little more. There is no issue with her memory at this time that is any different from her baseline. She denies unusual headaches, visual changes, nausea, vomiting, or dizziness. There has been no unusual cough, phlegm production, or  pleurisy. This been no change in bowel or  bladder habits. She denies unexplained fatigue or unexplained weight loss, bleeding, rash, or fever. A detailed review of systems was otherwise stable.        PAST MEDICAL HISTORY: Past Medical History:  Diagnosis Date  . Allergy   . Anxiety   . Breast cancer of upper-inner quadrant of right female breast ([place]) 10/17/2015  . Depression    no longer on medication off meds since Feb 2017  . Dysrhythmia   . Elevated cholesterol   . Family history of adverse reaction to anesthesia     mother has Copd was kept on vent   . Headache   . Heart murmur   . History of radiation therapy 02/11/16- 03/27/16   Right Chest Wall 50.4 Gy in 28 fractions, Right Chest Wall Boost 10 Gy in 5 fractions.   . Hypertension   . Hypothyroidism   . Menorrhagia 2011  . Urinary incontinence 2010    PAST SURGICAL HISTORY: Past Surgical History:  Procedure Laterality Date  . BREAST LUMPECTOMY Right 11/28/2015   RIGHT BREAST LUMPECTOMY WITH NEEDLE LOCALIZATION AND AXILLARY SENTINEL LYMPH NODE BX (Right)  . BREAST LUMPECTOMY WITH NEEDLE LOCALIZATION AND AXILLARY SENTINEL LYMPH NODE BX Right 11/28/2015   Procedure: RIGHT BREAST LUMPECTOMY WITH NEEDLE LOCALIZATION AND AXILLARY SENTINEL LYMPH NODE BX;  Surgeon: Autumn Messing III, MD;  Location: [place];  Service: General;  Laterality: Right;  . BREAST REDUCTION SURGERY Bilateral 11/28/2015   Procedure: MAMMARY REDUCTION  (BREAST) BILATERAL;  Surgeon: Crissie Reese, MD;  Location: [place];  Service: Plastics;  Laterality: Bilateral;  . CESAREAN SECTION    . COLONOSCOPY    . DENTAL SURGERY     2005,2013  . MASTECTOMY W/ SENTINEL NODE BIOPSY Bilateral 12/26/2015  . MASTECTOMY W/ SENTINEL NODE BIOPSY Bilateral 12/26/2015   Procedure: BILATERAL MASTECTOMY WITH LEFT SENTINEL LYMPH NODE BIOPSY;  Surgeon: Autumn Messing III, MD;  Location: [place];  Service: General;  Laterality: Bilateral;  . TUBAL LIGATION      FAMILY HISTORY Family History  Problem Relation Age of Onset  . Diabetes  Mother   . Hypertension Mother   . Hypertension Father   . Diabetes Maternal Grandmother   . Hypertension Sister   . Cancer Paternal Grandmother        colon  . Colon cancer Paternal Grandmother   . Esophageal cancer Neg Hx   . Stomach cancer Neg Hx   . Rectal cancer Neg Hx   The patient's parents are still living, in their early 13s as of June 2017. The patient has one brother, 2 sisters. On the maternal side there is a history of uterine and prostate cancer. On the paternal side there is a history of colon cancer and possibly uterine cancer. There is no history of breast or ovarian cancer in the family.   GYNECOLOGIC HISTORY:  No LMP recorded.  Menarche age 20. She still having regular periods. She is GX P1, first pregnancy age 88. She used oral contraceptives for a few years remotely, with no complications. She is currently on a Mirena IUD and also is status post bilateral tubal ligation.  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 Her husband Debbie [name] is a Administrator. Their son Debbie [name] is a radio DJ. The patient has no grandchildren. She is not a Ambulance person.    ADVANCED DIRECTIVES: Not in place   HEALTH MAINTENANCE: Social History   Tobacco Use  .  Smoking status: Never Smoker  . Smokeless tobacco: Never Used  Substance Use Topics  . Alcohol use: No  . Drug use: No     Colonoscopy: January 2016   PAP: January 2016   Bone density: August 2017 showed a T score of +0.7 normal  Lipid panel:  Allergies  Allergen Reactions  . Ace Inhibitors Cough  . Atorvastatin Other (See Comments)    Joint pain  . Simvastatin Other (See Comments)    Joint pain    Current Outpatient Medications  Medication Sig Dispense Refill  . acetaminophen (TYLENOL) 325 MG tablet Take 650 mg by mouth every 6 (six) hours as needed for mild pain. Reported on 10/23/2015    . Calcium-Magnesium 250-125 MG TABS Take 1 tablet by mouth daily. 120 each 4  . Cetirizine HCl  (ZYRTEC ALLERGY PO) Take 1 tablet by mouth daily as needed (allergies).     . hydrochlorothiazide (MICROZIDE) 12.5 MG capsule Take 12.5 mg by mouth daily.    Marland Kitchen levonorgestrel (MIRENA) 20 MCG/24HR IUD 1 each by Intrauterine route once.    Marland Kitchen levothyroxine (SYNTHROID, LEVOTHROID) 125 MCG tablet Take 125 mcg by mouth daily.    Marland Kitchen LIVALO 2 MG TABS Take 0.2 mg by mouth daily.   6  . losartan (COZAAR) 100 MG tablet Take 100 mg by mouth daily.    . metFORMIN (GLUCOPHAGE-XR) 750 MG 24 hr tablet Take 750 mg by mouth every evening.   6  . MULTIPLE MINERALS PO Take by mouth.    . omega-3 acid ethyl esters (LOVAZA) 1 g capsule Take 1 g by mouth 2 (two) times daily.    . palbociclib (IBRANCE) 125 MG capsule Take 1 capsule (125 mg total) by mouth daily with lunch. Take for 21 days on, 7 days off, repeat every 28 days. Take whole with food. 21 capsule 6  . potassium chloride SA (K-DUR,KLOR-CON) 20 MEQ tablet Take 40 mEq by mouth 2 (two) times daily.     . traMADol (ULTRAM) 50 MG tablet Take 1 tablet (50 mg total) by mouth every 6 (six) hours as needed. 60 tablet 3  . Vitamin D, Cholecalciferol, 1000 units CAPS Take 1 tablet by mouth daily. 90 capsule 4   No current facility-administered medications for this visit.     OBJECTIVE: Middle-aged African-American woman in no acute distress  Vitals:   08/31/17 0835  BP: (!) 142/81  Pulse: 91  Resp: 18  Temp: 99 F (37.2 C)  SpO2: 100%     Body mass index is 43.07 kg/m.    ECOG FS:1 - Symptomatic but completely ambulatory  Sclerae unicteric, pupils round and equal Oropharynx clear and moist No cervical or supraclavicular adenopathy Lungs no rales or rhonchi Heart regular rate and rhythm Abd soft, nontender, positive bowel sounds MSK no focal spinal tenderness, no upper extremity lymphedema Neuro: nonfocal, well oriented, positive affect Breasts: Status post bilateral mastectomies with no evidence of chest wall recurrence.  Both axillae are  benign.  LAB RESULTS:  CMP     Component Value Date/Time   NA 140 08/17/2017 1541   NA 140 07/08/2016 0948   K 4.2 08/17/2017 1541   K 4.1 07/08/2016 0948   CL 105 08/17/2017 1541   CO2 26 08/17/2017 1541   CO2 24 07/08/2016 0948   GLUCOSE 104 08/17/2017 1541   GLUCOSE 89 07/08/2016 0948   BUN 12 08/17/2017 1541   BUN 11.9 07/08/2016 0948   CREATININE 0.94 08/17/2017 1541   CREATININE 0.90  06/22/2017 1147   CREATININE 0.9 07/08/2016 0948   CALCIUM 9.9 08/17/2017 1541   CALCIUM 10.0 07/08/2016 0948   PROT 7.5 08/17/2017 1541   PROT 7.6 07/08/2016 0948   ALBUMIN 3.7 08/17/2017 1541   ALBUMIN 3.6 07/08/2016 0948   AST 44 (H) 08/17/2017 1541   AST 31 06/22/2017 1147   AST 22 07/08/2016 0948   ALT 52 08/17/2017 1541   ALT 45 06/22/2017 1147   ALT 27 07/08/2016 0948   ALKPHOS 135 08/17/2017 1541   ALKPHOS 115 07/08/2016 0948   BILITOT 0.3 08/17/2017 1541   BILITOT 0.4 06/22/2017 1147   BILITOT 0.35 07/08/2016 0948   GFRNONAA >60 08/17/2017 1541   GFRNONAA >60 06/22/2017 1147   GFRAA >60 08/17/2017 1541   GFRAA >60 06/22/2017 1147    INo results found for: SPEP, UPEP  Lab Results  Component Value Date   WBC 6.8 08/17/2017   NEUTROABS 4.7 08/17/2017   HGB 13.4 08/17/2017   HCT 40.2 08/17/2017   MCV 93.7 08/17/2017   PLT 272 08/17/2017      Chemistry      Component Value Date/Time   NA 140 08/17/2017 1541   NA 140 07/08/2016 0948   K 4.2 08/17/2017 1541   K 4.1 07/08/2016 0948   CL 105 08/17/2017 1541   CO2 26 08/17/2017 1541   CO2 24 07/08/2016 0948   BUN 12 08/17/2017 1541   BUN 11.9 07/08/2016 0948   CREATININE 0.94 08/17/2017 1541   CREATININE 0.90 06/22/2017 1147   CREATININE 0.9 07/08/2016 0948      Component Value Date/Time   CALCIUM 9.9 08/17/2017 1541   CALCIUM 10.0 07/08/2016 0948   ALKPHOS 135 08/17/2017 1541   ALKPHOS 115 07/08/2016 0948   AST 44 (H) 08/17/2017 1541   AST 31 06/22/2017 1147   AST 22 07/08/2016 0948   ALT 52 08/17/2017  1541   ALT 45 06/22/2017 1147   ALT 27 07/08/2016 0948   BILITOT 0.3 08/17/2017 1541   BILITOT 0.4 06/22/2017 1147   BILITOT 0.35 07/08/2016 0948       No results found for: LABCA2  No components found for: LABCA125  No results for input(s): INR in the last 168 hours.  Urinalysis No results found for: COLORURINE, APPEARANCEUR, LABSPEC, PHURINE, GLUCOSEU, HGBUR, BILIRUBINUR, KETONESUR, PROTEINUR, UROBILINOGEN, NITRITE, LEUKOCYTESUR   STUDIES: Ct Head W Wo Contrast  Result Date: 08/26/2017 CLINICAL DATA:  Metastatic breast cancer. Blurry vision and vertigo. EXAM: CT HEAD WITHOUT AND WITH CONTRAST TECHNIQUE: Contiguous axial images were obtained from the base of the skull through the vertex without and with intravenous contrast CONTRAST:  73m ISOVUE-300 IOPAMIDOL (ISOVUE-300) INJECTION 61% COMPARISON:  None. FINDINGS: BRAIN: No intraparenchymal hemorrhage, mass effect nor midline shift. The ventricles and sulci are normal. No acute large vascular territory infarcts. No abnormal extra-axial fluid collections. Basal cisterns are patent. VASCULAR: Trace calcific atherosclerosis carotid siphon. SKULL/SOFT TISSUES: No skull fracture. No significant soft tissue swelling. Empty J-shaped sella. ORBITS/SINUSES: The included ocular globes and orbital contents are normal.The mastoid aircells and included paranasal sinuses are well-aerated. OTHER: None. IMPRESSION: 1. No acute intracranial process. 2. Empty sella, otherwise unremarkable noncontrast CT HEAD for age. Electronically Signed   By: CElon AlasM.D.   On: 08/26/2017 21:32   Ct Chest W Contrast  Result Date: 08/16/2017 CLINICAL DATA:  Breast cancer. Staging. Bilateral primaries. Bilateral mastectomy 12/26/2015. EXAM: CT CHEST WITH CONTRAST TECHNIQUE: Multidetector CT imaging of the chest was performed during intravenous contrast administration.  CONTRAST:  56m ISOVUE-300 IOPAMIDOL (ISOVUE-300) INJECTION 61% COMPARISON:  Spinal MRI of  08/12/2017. Abdominopelvic CT 08/04/2017. No prior chest CT. Chest radiograph 07/16/2004 reviewed. FINDINGS: Cardiovascular: Mild cardiomegaly, without pericardial effusion Mediastinum/Nodes: No supraclavicular adenopathy. Right axillary node dissection. No axillary or subpectoral adenopathy. Borderline low right mediastinal adenopathy at 10 mm on image 2/image 81/2. Right infrahilar adenopathy at 1.5 by 2.0 cm on image 84/2. Tiny hiatal hernia. Lungs/Pleura: No pleural fluid. Right apical and less so anterior right upper lobe interstitial thickening are likely radiation induced. Scattered bilateral pulmonary nodules. These are identified on series 5. Examples within the right middle lobe at 5 mm on image 80/5, 9 mm in the left lower lobe on image 80/5, 6 mm on the left hemidiaphragm on image 130/5, 5 mm in the right apex on image 43/5. Upper Abdomen: Marked hepatic steatosis. Heterogeneous density throughout. Normal imaged portions of the spleen, pancreas, kidneys. Minimal left adrenal nodularity. Musculoskeletal: Bilateral mastectomy. Widespread osseous metastasis, as on MRI. IMPRESSION: 1. Multiple bilateral pulmonary nodules, most consistent with metastatic disease. 2. significant right infrahilar adenopathy is likely due to metastasis. Borderline low right mediastinal adenopathy is indeterminate. 3. Hepatic steatosis.  Hepatic metastasis are more apparent on MRI. 4. Tiny hiatal hernia. 5. Osseous metastasis. Electronically Signed   By: KAbigail MiyamotoM.D.   On: 08/16/2017 08:36   Ct Abdomen Pelvis W Contrast  Result Date: 08/04/2017 CLINICAL DATA:  History of breast carcinoma with abdominal pain and bloating EXAM: CT ABDOMEN AND PELVIS WITH CONTRAST TECHNIQUE: Multidetector CT imaging of the abdomen and pelvis was performed using the standard protocol following bolus administration of intravenous contrast. CONTRAST:  1241mISOVUE-300 IOPAMIDOL (ISOVUE-300) INJECTION 61% COMPARISON:  None. FINDINGS: Lower  chest: Lung bases are well aerated. A 6 mm nodule is noted in the left lower lobe best seen on image number 36 of series 5. No other nodules are seen. Hepatobiliary: Diffuse decreased attenuation in the liver is noted with a few small areas of relative sparing. The largest of these lies in the right lobe best seen on image number 18 of series 2 measuring 12.5 mm. Scattered smaller similar areas are noted. Pancreas: Unremarkable. No pancreatic ductal dilatation or surrounding inflammatory changes. Spleen: Normal in size without focal abnormality. Adrenals/Urinary Tract: Adrenal glands are unremarkable. Kidneys are normal, without renal calculi, focal lesion, or hydronephrosis. Bladder is decompressed. Stomach/Bowel: Stomach is within normal limits. Appendix appears normal. No evidence of bowel wall thickening, distention, or inflammatory changes. Vascular/Lymphatic: No significant vascular findings are present. No enlarged abdominal or pelvic lymph nodes. Reproductive: An IUD is noted in place. Somewhat peripherally enhancing hypodensity is noted within the uterus to the right of the midline. This likely represents a uterine fibroid. Pelvic ultrasound may be helpful for further clarification. Other: No abdominal wall hernia or abnormality. No abdominopelvic ascites. Musculoskeletal: The bony structures demonstrates some scattered lucencies within the lumbar spine and thoracic spine. No prior areas is available for comparison. This appearance suggests underlying metastatic disease. IMPRESSION: Scattered areas of lucency within the thoracic and lumbar vertebral bodies highly suspicious for underlying metastatic disease given the patient's clinical history. MRI with contrast is recommended for further evaluation. 6 mm left lower lobe nodule as described. Non-contrast chest CT at 6-12 months is recommended. If the nodule is stable at time of repeat CT, then future CT at 18-24 months (from today's scan) is considered  optional for low-risk patients, but is recommended for high-risk patients. This recommendation follows the consensus statement: Guidelines for  Management of Incidental Pulmonary Nodules Detected on CT Images: From the Fleischner Society 2017; Radiology 2017; 284:228-243. Fatty infiltration of the liver with areas of enhancement identified primarily within the right lobe of the liver. Given the findings in the bony structures and the clinical history these may represent metastatic lesions. MRI of the abdomen with contrast is recommended as well for further evaluation. These results will be called to the ordering clinician or representative by the Radiologist Assistant, and communication documented in the PACS or zVision Dashboard. Electronically Signed   By: Inez Catalina M.D.   On: 08/04/2017 14:21   Mr Liver W DG Contrast  Result Date: 08/12/2017 CLINICAL DATA:  Right breast cancer. Staging evaluation. Abdominal pain and bloating. Suspected liver masses on recent CT study. EXAM: MRI ABDOMEN WITHOUT AND WITH CONTRAST TECHNIQUE: Multiplanar multisequence MR imaging of the abdomen was performed both before and after the administration of intravenous contrast. CONTRAST:  73m MULTIHANCE GADOBENATE DIMEGLUMINE 529 MG/ML IV SOLN COMPARISON:  08/04/2017 CT abdomen/pelvis. FINDINGS: Lower chest: Redemonstrated are 3 basilar left lower lobe pulmonary nodules measuring up to 5 mm (series 5/image 11). Hepatobiliary: Normal liver size and configuration. Moderate to severe diffuse hepatic steatosis. There are numerous (greater than 20) similar-appearing small liver masses scattered throughout the liver, each demonstrating mild T2 hyperintensity and heterogeneous targetoid enhancement, compatible with liver metastases. Representative liver masses include a 1.8 x 1.7 cm segment 7 right liver lobe mass (series 5/image 12), a 1.4 x 1.3 cm segment 6 right liver lobe mass (series 5/image 24) and a 1.1 x 0.9 cm anterior segment 8  right liver lobe mass (series 5/image 15). Normal gallbladder with no cholelithiasis. No biliary ductal dilatation. Common bile duct diameter 3 mm. No choledocholithiasis. Pancreas: No pancreatic mass or duct dilation.  No pancreas divisum. Spleen: Normal size. No mass. Adrenals/Urinary Tract: Normal adrenals. No hydronephrosis. Small simple parapelvic renal cysts in the left kidney. No suspicious renal masses. Stomach/Bowel: Grossly normal stomach. Visualized small and large bowel is normal caliber, with no bowel wall thickening. Vascular/Lymphatic: Normal caliber abdominal aorta. Patent portal, splenic, hepatic and renal veins. No pathologically enlarged lymph nodes in the abdomen. Other: No abdominal ascites or focal fluid collection. Musculoskeletal: There are innumerable patchy confluent enhancing osseous lesions throughout the visualized thoracolumbar spine. IMPRESSION: 1. Numerous (greater than 20) similar small enhancing liver masses scattered throughout the liver, with MRI features compatible with hepatic metastases. 2. Innumerable patchy confluent Hansen osseous metastases throughout the visualized thoracolumbar spine. 3. Redemonstration of 3 small pulmonary nodules in the basilar left lower lobe, better seen on the 08/04/2017 CT abdomen study. Small pulmonary metastases not excluded. Recommend attention on short-term chest CT follow-up in 3 months. 4. Diffuse hepatic steatosis. Electronically Signed   By: JIlona SorrelM.D.   On: 08/12/2017 09:38   Mr Total Spine Mets Screening  Result Date: 08/13/2017 CLINICAL DATA:  Invasive breast cancer.  Screening spinal MRI. EXAM: MRI TOTAL SPINE WITHOUT AND WITH CONTRAST TECHNIQUE: Multisequence MR imaging of the spine from the cervical spine to the sacrum was performed prior to and following IV contrast administration for evaluation of spinal metastatic disease. CONTRAST:  239mMULTIHANCE GADOBENATE DIMEGLUMINE 529 MG/ML IV SOLN COMPARISON:  None. FINDINGS: MRI  CERVICAL SPINE FINDINGS Alignment: Straightening. Slight degenerative retrolisthesis at C5-6. Vertebrae: Right C4 articular process lesion. Negative for fracture. Cord: Normal signal and morphology Posterior Fossa, vertebral arteries, paraspinal tissues: No visualized mass. Disc levels: Disc narrowing and bulging C3-4 to C5-6. No visible cord impingement.  No notable facet arthropathy. MRI THORACIC SPINE FINDINGS Alignment:  Normal Vertebrae: Sparing T1 there is metastatic disease in each vertebra. The T4 deposit is small and at the spinous process. The largest is a confluent metastasis in the T11 body and left pedicle eroding the posterior cortex without visible epidural tumor. Negative for fracture. Cord: Normal signal and morphology. No evidence of intrathecal metastasis. Paraspinal and other soft tissues: Negative Disc levels: No visible degenerative impingement. MRI LUMBAR SPINE FINDINGS Segmentation: When numbered from above there are 4 lumbar type vertebral bodies. On a 2006 chest x-ray there are bilateral cervical ribs and 11 seen thoracic ribs. Alignment:  Normal Vertebrae: Metastatic lesions throughout each vertebrae, most extensive in the L2 body where there is a superior endplate fracture that is chronic appearing based on STIR appearance. No visible epidural tumor. Conus medullaris: Extends to the L1-2 level and appears normal. Paraspinal and other soft tissues: Recent abdominal CT. No new finding. Disc levels: Mid lower lumbar disc bulging. L5-S1 degenerative facet spurring. No degenerative impingement. IMPRESSION: Cervical spine: C4 small right articular process metastasis. No extra osseous tumor or fracture. Thoracic spine: 1. Sparing T1 there is metastatic disease in each vertebrae. The largest is at the T11 vertebral body and left pedicle with posterior cortex erosion but no visible extra osseous tumor. 2. Negative for fracture. Lumbar spine: 1. Four lumbar type vertebral bodies. 2. Metastatic  disease at each level, greatest in the L2 body which shows a chronic superior endplate fracture. No evidence of extra osseous tumor. Electronically Signed   By: Monte Fantasia M.D.   On: 08/13/2017 10:21    ELIGIBLE FOR AVAILABLE RESEARCH PROTOCOL: PALLAS, but inelegible because of Mirena IUD  ASSESSMENT: 52 y.o. [place] woman status post right breast upper inner quadrant biopsy 10/15/2015 for a clinical T2-T3 N0, stage II invasive ductal carcinoma, estrogen and progesterone receptor positive, HER-2 nonamplified, with an Mib-1 of 70%  (1) status post right lumpectomy and sentinel lymph node sampling with oncoplastic breast reduction 11/28/2015 for a pT2 pN1, stage IIB invasive ductal carcinoma, grade 1, with negative margins (1/1 sentinel node positive)  (a) reclassified as stage IIA in the 2018 prognostic revision   (2) left reduction mammoplasty 11/28/2015 unexpectedly found a pT1c pNX invasive ductal carcinoma, grade 1, estrogen and progesterone receptor positive, HER-2 not amplified, with an MIB-1 of 3%  (3) Mammaprint from the right sided breast cancer was "low risk", predicting a 98% chance of no disease recurrence within 5 years with anti-estrogens as the only systemic therapy. It also predicts minimal to no benefit from chemotherapy  (4) status post bilateral mastectomies with bilateral sentinel lymph node sampling 12/26/2015, showing  (a) on the right, no residual carcinoma (0/3 nodes involved)  (b) on the left, focal ductal carcinoma in situ, 0.2 cm, with negative margins; (0/4 nodes involved)  (5) adjuvant radiation 02/11/16 - 03/27/16    1) Right chest wall: 50.4 Gy in 28 fractions               2) Right chest wall boost: 10 Gy in 5 fractions   (6) started on tamoxifen neoadjuvantly 10/23/2015 to allow for expected delays in definitive local treatment  (a) Mirena IUD in place  (b) tamoxifen discontinued 08/13/2017, with progression  (7) since she had 4 lymph nodes removed  from each axilla but also receive radiation on the right, lab draws should preferentially be obtained from the left arm   METASTATIC DISEASE: April 2019 (8) CT scan of the abdomen  and pelvis 08/04/2017, MRI of the liver and total spine MRI 08/11/2017 and CT scan of the chest and brain on 08/13/2017 showed liver and bone metastases  (a) liver biopsy pending  (b) chest CT scan 08/13/2017 shows multiple bilateral pulmonary nodules.  (c) CT of the brain with and without contrast 08/26/2017 shows no evidence of metastatic disease to the brain  (9) fulvestrant started 08/17/2017  (a) palbociclib to start 08/31/2017  (10) denosumab/Xgeva to start 09/14/2017  PLAN: Shaunice is tolerating the fulvestrant well.  We are adding palbociclib today.  Our oral pharmacy chemotherapy specialist has discussed this drug with her in detail.  Today I reinforced the fact that she might have some nausea, and fatigue associated with this.  Of course this may and probably will drop her counts, and we will be checking her counts essentially every 2 weeks for the next 3 determinations and then monthly.   I encouraged Eloyse to call us with any problems.  She is making an enormous effort to remain positive, is continuing to work, and is taking things "as they come."  I do not know how she could be doing any better psychologically than she is doing.  The liver biopsy has not yet been scheduled.  I have reentered the order today.  Hopefully we will get those results by the time she sees is again 2 weeks from now.  She will have her Mirena removed by Dr. Leo Grosser.  I will check an [place] and estradiol in approximately 1 month  At that visit she will in addition to continuing the fulvestrant, start her denosumab/Xgeva    Zoe Creasman, Virgie Dad, MD  08/31/17 9:00 AM Medical Oncology and Hematology [facility] [place], [place] [address] Tel. [phone]    Fax. [phone]  This document  serves as a record of services personally performed by Chauncey Cruel, MD. It was created on his behalf by Steva Colder, a trained medical scribe. The creation of this record is based on the scribe's personal observations and the provider's statements to them.   I have reviewed the above documentation for accuracy and completeness, and I agree with the above.

## 2017-08-31 ENCOUNTER — Encounter (HOSPITAL_BASED_OUTPATIENT_CLINIC_OR_DEPARTMENT_OTHER): Payer: Self-pay | Admitting: *Deleted

## 2017-08-31 ENCOUNTER — Inpatient Hospital Stay: Payer: 59

## 2017-08-31 ENCOUNTER — Other Ambulatory Visit: Payer: Self-pay

## 2017-08-31 ENCOUNTER — Telehealth: Payer: Self-pay | Admitting: Oncology

## 2017-08-31 ENCOUNTER — Other Ambulatory Visit: Payer: Self-pay | Admitting: Oncology

## 2017-08-31 ENCOUNTER — Inpatient Hospital Stay (HOSPITAL_BASED_OUTPATIENT_CLINIC_OR_DEPARTMENT_OTHER): Payer: 59 | Admitting: Oncology

## 2017-08-31 VITALS — BP 164/90 | HR 95 | Temp 98.4°F | Resp 16

## 2017-08-31 VITALS — BP 142/81 | HR 91 | Temp 99.0°F | Resp 18 | Ht 65.0 in | Wt 258.8 lb

## 2017-08-31 DIAGNOSIS — Z9013 Acquired absence of bilateral breasts and nipples: Secondary | ICD-10-CM | POA: Diagnosis not present

## 2017-08-31 DIAGNOSIS — C7951 Secondary malignant neoplasm of bone: Secondary | ICD-10-CM

## 2017-08-31 DIAGNOSIS — Z79899 Other long term (current) drug therapy: Secondary | ICD-10-CM | POA: Diagnosis not present

## 2017-08-31 DIAGNOSIS — Z923 Personal history of irradiation: Secondary | ICD-10-CM | POA: Diagnosis not present

## 2017-08-31 DIAGNOSIS — C50919 Malignant neoplasm of unspecified site of unspecified female breast: Secondary | ICD-10-CM | POA: Diagnosis not present

## 2017-08-31 DIAGNOSIS — C787 Secondary malignant neoplasm of liver and intrahepatic bile duct: Secondary | ICD-10-CM | POA: Diagnosis not present

## 2017-08-31 DIAGNOSIS — C50211 Malignant neoplasm of upper-inner quadrant of right female breast: Secondary | ICD-10-CM

## 2017-08-31 DIAGNOSIS — Z170 Estrogen receptor positive status [ER+]: Secondary | ICD-10-CM

## 2017-08-31 DIAGNOSIS — R918 Other nonspecific abnormal finding of lung field: Secondary | ICD-10-CM

## 2017-08-31 DIAGNOSIS — Z30432 Encounter for removal of intrauterine contraceptive device: Secondary | ICD-10-CM | POA: Diagnosis not present

## 2017-08-31 DIAGNOSIS — C50112 Malignant neoplasm of central portion of left female breast: Secondary | ICD-10-CM

## 2017-08-31 MED ORDER — FULVESTRANT 250 MG/5ML IM SOLN
500.0000 mg | Freq: Once | INTRAMUSCULAR | Status: AC
  Administered 2017-08-31: 500 mg via INTRAMUSCULAR

## 2017-08-31 MED ORDER — FULVESTRANT 250 MG/5ML IM SOLN
INTRAMUSCULAR | Status: AC
  Filled 2017-08-31: qty 5

## 2017-08-31 NOTE — Progress Notes (Signed)
SPOKE W/ PT VIA PHONE FOR PRE-OP INTERVIEW.  NPO AFTER MN W/ EXCEPTION CLEAR LIQUID UNTIL 0615 (NO CREAM/ MILK PRODUCTS).  ARRIVE AT 1015.  CBCdiff  & CMET, DATED 08-17-2017 IN CHART AND Epic.  CURRENT CHEST CT AND EKG IN CHART AND Epic.  WILL TAKE SYNTHROID AM DOS W/ SIPS OF WATER AND IF NEEDED TAKE ZYRTEC/ TYLENOL.

## 2017-08-31 NOTE — Telephone Encounter (Signed)
Gave patient AVs and calendar of upcoming April through July appointments. Patient scheduled 09/2017 per provider approval. Lab is in meeting that morning so lab will be after office visit and before inj appointment

## 2017-08-31 NOTE — Addendum Note (Signed)
Addended by: Chauncey Cruel on: 08/31/2017 01:03 PM   Modules accepted: Orders

## 2017-09-01 ENCOUNTER — Encounter (HOSPITAL_BASED_OUTPATIENT_CLINIC_OR_DEPARTMENT_OTHER): Payer: Self-pay | Admitting: Obstetrics and Gynecology

## 2017-09-01 ENCOUNTER — Other Ambulatory Visit: Payer: Self-pay | Admitting: Obstetrics and Gynecology

## 2017-09-01 DIAGNOSIS — T839XXA Unspecified complication of genitourinary prosthetic device, implant and graft, initial encounter: Secondary | ICD-10-CM | POA: Diagnosis present

## 2017-09-01 NOTE — H&P (Signed)
Debbie [name] is an 52 y.o. female. With retained IUD that needs to be removed  Pertinent Gynecological History: Menses: none Bleeding: none Contraception: tubal ligation DES exposure: unknown Blood transfusions: none Sexually transmitted diseases: no past history Previous GYN Procedures: tubal ligation  Last mammogram: abnormal: 2017 Date: 2017.   Pt has had bilateral mastectomy so no mammograms needed Last pap: normal Date: 04/2013 OB History: G1, P1   Menstrual History: Menarche age: 57 No LMP recorded. (Menstrual status: IUD).    Past Medical History:  Diagnosis Date  . Anxiety   . Bone metastasis ([place])   . Breast cancer of upper-inner quadrant of right female breast [facility]) oncologist-  dr [name]--- 04/ 2019 ER/PR+ Stage IV w/ metastatic disease to liver, total spine, lung, bone   dx 06/ 2017  right breast invasive ductal carcinoma, Stage IIB, ER/PR + (pT2 pN1), grade 1-- s/p  right breast lumpecotmy w/ sln bx's and bilateral breast reduction 11-28-2015/  left breast with reduction , dx invasive ductal ca, Grade 1 (pT1cpNX) ER/PR positive/  adjuvant radiation completed 03-27-2016 to right chest wall, 2018  reclassifiec Stage IIA  . Cancer, metastatic to liver ([place])   . Chronic back pain   . Depression   . Family history of adverse reaction to anesthesia     mother has Copd was kept on vent   . Headache   . Heart murmur   . History of hyperthyroidism    s/p  RAI  . History of radiation therapy 02/11/16- 03/27/16   Right Chest Wall 50.4 Gy in 28 fractions, Right Chest Wall Boost 10 Gy in 5 fractions.   . Hyperlipidemia   . Hypertension   . Hypothyroidism, postradioiodine therapy    endocrinoloigst-  dr balan--  2004  s/p  RAI  . Menorrhagia 2011  . Multiple pulmonary nodules    secondary  metastatic  . PONV (postoperative nausea and vomiting)   . SUI (stress urinary incontinence, female)   . Wears contact lenses   . Wears dentures    full upper and lower partial     Past Surgical History:  Procedure Laterality Date  . BREAST LUMPECTOMY WITH NEEDLE LOCALIZATION AND AXILLARY SENTINEL LYMPH NODE BX Right 11/28/2015   Procedure: RIGHT BREAST LUMPECTOMY WITH NEEDLE LOCALIZATION AND AXILLARY SENTINEL LYMPH NODE BX;  Surgeon: Autumn Messing III, MD;  Location: [place];  Service: General;  Laterality: Right;  . BREAST REDUCTION SURGERY Bilateral 11/28/2015   Procedure: MAMMARY REDUCTION  (BREAST) BILATERAL;  Surgeon: Crissie Reese, MD;  Location: [place];  Service: Plastics;  Laterality: Bilateral;  . CESAREAN SECTION  01/12/1993  . COLONOSCOPY    . MASTECTOMY W/ SENTINEL NODE BIOPSY Bilateral 12/26/2015   Procedure: BILATERAL MASTECTOMY WITH LEFT SENTINEL LYMPH NODE BIOPSY;  Surgeon: Autumn Messing III, MD;  Location: [place];  Service: General;  Laterality: Bilateral;  . TUBAL LIGATION Bilateral 1995    Family History  Problem Relation Age of Onset  . Diabetes Mother   . Hypertension Mother   . Hypertension Father   . Diabetes Maternal Grandmother   . Hypertension Sister   . Cancer Paternal Grandmother        colon  . Colon cancer Paternal Grandmother   . Esophageal cancer Neg Hx   . Stomach cancer Neg Hx   . Rectal cancer Neg Hx     Social History:  reports that she has never smoked. She has never used smokeless tobacco. She reports that she does not drink alcohol  or use drugs.  Allergies:  Allergies  Allergen Reactions  . Ace Inhibitors Cough  . Atorvastatin Other (See Comments)    Joint pain  . Simvastatin Other (See Comments)    Joint pain    No medications prior to admission.    Review of Systems  Genitourinary: Positive for dysuria.    There were no vitals taken for this visit. Physical Exam  Constitutional: She appears well-developed and well-nourished.  HENT:  Head: Normocephalic and atraumatic.  Eyes: EOM are normal.  Neck: Neck supple.  Cardiovascular: Normal rate and regular rhythm.  Respiratory: Effort normal and breath sounds  normal.  GI: Soft. Bowel sounds are normal.  Genitourinary:  Genitourinary Comments: Pelvic exam: VULVA: normal appearing vulva with no masses, tenderness or lesions Vagina: no lesions Cervix: IUD string visible Uterus: enlarged but extent limited becaus.  of body habitus. Adnexae:  No palpable masses  No results found for this or any previous visit (from the past 24 hour(s)).  No results found.  Assessment/Plan: Pt with new dx of stage 4 breast cancer with need for removal of Mirena IUD.  Unable to accomplish in office due to pt discomfort. Will perform IUD removal under general anesthesia.  Risks and benefits reviewed.  Pt agrees.    Seymour Bars Alvita Fana 09/01/2017, 11:06 PM

## 2017-09-02 ENCOUNTER — Ambulatory Visit (HOSPITAL_BASED_OUTPATIENT_CLINIC_OR_DEPARTMENT_OTHER)
Admission: RE | Admit: 2017-09-02 | Discharge: 2017-09-02 | Disposition: A | Discharge status: Home / Self Care | Payer: 59 | Source: Ambulatory Visit | Attending: Obstetrics and Gynecology | Admitting: Obstetrics and Gynecology

## 2017-09-02 ENCOUNTER — Ambulatory Visit (HOSPITAL_BASED_OUTPATIENT_CLINIC_OR_DEPARTMENT_OTHER): Payer: 59 | Admitting: Certified Registered"

## 2017-09-02 ENCOUNTER — Encounter (HOSPITAL_BASED_OUTPATIENT_CLINIC_OR_DEPARTMENT_OTHER): Payer: Self-pay

## 2017-09-02 ENCOUNTER — Encounter (HOSPITAL_BASED_OUTPATIENT_CLINIC_OR_DEPARTMENT_OTHER)
Admission: RE | Disposition: A | Discharge status: Home / Self Care | Payer: Self-pay | Source: Ambulatory Visit | Attending: Obstetrics and Gynecology

## 2017-09-02 DIAGNOSIS — E785 Hyperlipidemia, unspecified: Secondary | ICD-10-CM | POA: Diagnosis not present

## 2017-09-02 DIAGNOSIS — R51 Headache: Secondary | ICD-10-CM | POA: Diagnosis not present

## 2017-09-02 DIAGNOSIS — E039 Hypothyroidism, unspecified: Secondary | ICD-10-CM | POA: Diagnosis not present

## 2017-09-02 DIAGNOSIS — E119 Type 2 diabetes mellitus without complications: Secondary | ICD-10-CM | POA: Insufficient documentation

## 2017-09-02 DIAGNOSIS — C50919 Malignant neoplasm of unspecified site of unspecified female breast: Secondary | ICD-10-CM | POA: Diagnosis not present

## 2017-09-02 DIAGNOSIS — Z975 Presence of (intrauterine) contraceptive device: Secondary | ICD-10-CM | POA: Diagnosis not present

## 2017-09-02 DIAGNOSIS — Z7984 Long term (current) use of oral hypoglycemic drugs: Secondary | ICD-10-CM | POA: Diagnosis not present

## 2017-09-02 DIAGNOSIS — Z30432 Encounter for removal of intrauterine contraceptive device: Secondary | ICD-10-CM | POA: Diagnosis not present

## 2017-09-02 DIAGNOSIS — Z6841 Body Mass Index (BMI) 40.0 and over, adult: Secondary | ICD-10-CM | POA: Diagnosis not present

## 2017-09-02 DIAGNOSIS — E669 Obesity, unspecified: Secondary | ICD-10-CM | POA: Diagnosis not present

## 2017-09-02 DIAGNOSIS — I10 Essential (primary) hypertension: Secondary | ICD-10-CM | POA: Insufficient documentation

## 2017-09-02 DIAGNOSIS — C50211 Malignant neoplasm of upper-inner quadrant of right female breast: Secondary | ICD-10-CM | POA: Insufficient documentation

## 2017-09-02 DIAGNOSIS — R32 Unspecified urinary incontinence: Secondary | ICD-10-CM | POA: Insufficient documentation

## 2017-09-02 DIAGNOSIS — T839XXA Unspecified complication of genitourinary prosthetic device, implant and graft, initial encounter: Secondary | ICD-10-CM | POA: Diagnosis present

## 2017-09-02 DIAGNOSIS — N393 Stress incontinence (female) (male): Secondary | ICD-10-CM | POA: Diagnosis not present

## 2017-09-02 HISTORY — DX: Other chronic pain: G89.29

## 2017-09-02 HISTORY — DX: Secondary malignant neoplasm of liver and intrahepatic bile duct: C78.7

## 2017-09-02 HISTORY — DX: Personal history of other endocrine, nutritional and metabolic disease: Z86.39

## 2017-09-02 HISTORY — DX: Presence of dental prosthetic device (complete) (partial): Z97.2

## 2017-09-02 HISTORY — DX: Secondary malignant neoplasm of bone: C79.51

## 2017-09-02 HISTORY — DX: Other nonspecific abnormal finding of lung field: R91.8

## 2017-09-02 HISTORY — DX: Other specified postprocedural states: R11.2

## 2017-09-02 HISTORY — DX: Dorsalgia, unspecified: M54.9

## 2017-09-02 HISTORY — DX: Other specified postprocedural states: Z98.890

## 2017-09-02 HISTORY — DX: Stress incontinence (female) (male): N39.3

## 2017-09-02 HISTORY — DX: Postprocedural hypothyroidism: E89.0

## 2017-09-02 HISTORY — PX: HYSTEROSCOPY: SHX211

## 2017-09-02 HISTORY — DX: Presence of spectacles and contact lenses: Z97.3

## 2017-09-02 HISTORY — DX: Hyperlipidemia, unspecified: E78.5

## 2017-09-02 SURGERY — HYSTEROSCOPY
Anesthesia: General | Site: Vagina

## 2017-09-02 MED ORDER — OXYCODONE HCL 5 MG/5ML PO SOLN
5.0000 mg | Freq: Once | ORAL | Status: DC | PRN
  Filled 2017-09-02: qty 5

## 2017-09-02 MED ORDER — PHENYLEPHRINE 40 MCG/ML (10ML) SYRINGE FOR IV PUSH (FOR BLOOD PRESSURE SUPPORT)
PREFILLED_SYRINGE | INTRAVENOUS | Status: DC | PRN
  Administered 2017-09-02: 120 ug via INTRAVENOUS

## 2017-09-02 MED ORDER — ACETAMINOPHEN 160 MG/5ML PO SOLN
325.0000 mg | ORAL | Status: DC | PRN
  Filled 2017-09-02: qty 20.3

## 2017-09-02 MED ORDER — KETOROLAC TROMETHAMINE 30 MG/ML IJ SOLN
INTRAMUSCULAR | Status: DC | PRN
  Administered 2017-09-02: 30 mg via INTRAVENOUS

## 2017-09-02 MED ORDER — ONDANSETRON HCL 4 MG/2ML IJ SOLN
INTRAMUSCULAR | Status: DC | PRN
  Administered 2017-09-02: 4 mg via INTRAVENOUS

## 2017-09-02 MED ORDER — SODIUM CHLORIDE 0.9 % IR SOLN
Status: DC | PRN
  Administered 2017-09-02: 3000 mL

## 2017-09-02 MED ORDER — LIDOCAINE HCL 2 % IJ SOLN
INTRAMUSCULAR | Status: DC | PRN
  Administered 2017-09-02: 10 mL

## 2017-09-02 MED ORDER — FENTANYL CITRATE (PF) 100 MCG/2ML IJ SOLN
25.0000 ug | INTRAMUSCULAR | Status: DC | PRN
  Administered 2017-09-02: 25 ug via INTRAVENOUS
  Filled 2017-09-02: qty 1

## 2017-09-02 MED ORDER — SCOPOLAMINE 1 MG/3DAYS TD PT72
MEDICATED_PATCH | TRANSDERMAL | Status: AC
  Filled 2017-09-02: qty 1

## 2017-09-02 MED ORDER — MIDAZOLAM HCL 5 MG/5ML IJ SOLN
INTRAMUSCULAR | Status: DC | PRN
  Administered 2017-09-02: 2 mg via INTRAVENOUS

## 2017-09-02 MED ORDER — FENTANYL CITRATE (PF) 100 MCG/2ML IJ SOLN
INTRAMUSCULAR | Status: AC
Start: 2017-09-02 — End: ?
  Filled 2017-09-02: qty 2

## 2017-09-02 MED ORDER — PROPOFOL 10 MG/ML IV BOLUS
INTRAVENOUS | Status: DC | PRN
  Administered 2017-09-02: 200 mg via INTRAVENOUS

## 2017-09-02 MED ORDER — LACTATED RINGERS IV SOLN
INTRAVENOUS | Status: DC
  Administered 2017-09-02 (×2): via INTRAVENOUS
  Filled 2017-09-02: qty 1000

## 2017-09-02 MED ORDER — LIDOCAINE 2% (20 MG/ML) 5 ML SYRINGE
INTRAMUSCULAR | Status: DC | PRN
  Administered 2017-09-02: 100 mg via INTRAVENOUS

## 2017-09-02 MED ORDER — SCOPOLAMINE 1 MG/3DAYS TD PT72
1.0000 | MEDICATED_PATCH | TRANSDERMAL | Status: DC
  Administered 2017-09-02: 1 via TRANSDERMAL
  Filled 2017-09-02: qty 1

## 2017-09-02 MED ORDER — DEXAMETHASONE SODIUM PHOSPHATE 4 MG/ML IJ SOLN
INTRAMUSCULAR | Status: DC | PRN
  Administered 2017-09-02: 10 mg via INTRAVENOUS

## 2017-09-02 MED ORDER — ACETAMINOPHEN 325 MG PO TABS
325.0000 mg | ORAL_TABLET | ORAL | Status: DC | PRN
  Filled 2017-09-02: qty 2

## 2017-09-02 MED ORDER — OXYCODONE HCL 5 MG PO TABS
5.0000 mg | ORAL_TABLET | Freq: Once | ORAL | Status: DC | PRN
  Filled 2017-09-02: qty 1

## 2017-09-02 MED ORDER — MIDAZOLAM HCL 2 MG/2ML IJ SOLN
INTRAMUSCULAR | Status: AC
  Filled 2017-09-02: qty 2

## 2017-09-02 MED ORDER — MEPERIDINE HCL 25 MG/ML IJ SOLN
6.2500 mg | INTRAMUSCULAR | Status: DC | PRN
  Filled 2017-09-02: qty 1

## 2017-09-02 MED ORDER — FENTANYL CITRATE (PF) 100 MCG/2ML IJ SOLN
INTRAMUSCULAR | Status: DC | PRN
  Administered 2017-09-02 (×2): 50 ug via INTRAVENOUS

## 2017-09-02 MED ORDER — ONDANSETRON HCL 4 MG/2ML IJ SOLN
4.0000 mg | Freq: Once | INTRAMUSCULAR | Status: DC | PRN
  Filled 2017-09-02: qty 2

## 2017-09-02 MED ORDER — IBUPROFEN 600 MG PO TABS: 600.0000 mg | ORAL_TABLET | Freq: Four times a day (QID) | ORAL | 0 refills | Status: DC | PRN

## 2017-09-02 MED FILL — IBUPROFEN 600 MG TABLET: 600 | 15 days supply | Qty: 60 | Fill #0

## 2017-09-02 SURGICAL SUPPLY — 18 items
BIPOLAR CUTTING LOOP 21FR (ELECTRODE)
CANISTER SUCT 3000ML PPV (MISCELLANEOUS) ×2 IMPLANT
CATH ROBINSON RED A/P 16FR (CATHETERS) ×2 IMPLANT
CLOTH BEACON ORANGE TIMEOUT ST (SAFETY) ×2 IMPLANT
COUNTER NEEDLE 1200 MAGNETIC (NEEDLE) ×1 IMPLANT
CURETTE PIPELLE ENDOMTRL SUCTN (MISCELLANEOUS) IMPLANT
DILATOR CANAL MILEX (MISCELLANEOUS) IMPLANT
GLOVE SURG SS PI 6.5 STRL IVOR (GLOVE) ×2 IMPLANT
GOWN STRL REUS W/TWL LRG LVL3 (GOWN DISPOSABLE) ×4 IMPLANT
KIT TURNOVER CYSTO (KITS) ×2 IMPLANT
LOOP CUTTING BIPOLAR 21FR (ELECTRODE) IMPLANT
PACK VAGINAL MINOR WOMEN LF (CUSTOM PROCEDURE TRAY) ×2 IMPLANT
PAD OB MATERNITY 4.3X12.25 (PERSONAL CARE ITEMS) ×2 IMPLANT
PIPELLE ENDOMETRIAL SUCTION CU (MISCELLANEOUS)
TOWEL OR 17X24 6PK STRL BLUE (TOWEL DISPOSABLE) ×4 IMPLANT
TUBING AQUILEX INFLOW (TUBING) ×2 IMPLANT
TUBING AQUILEX OUTFLOW (TUBING) ×2 IMPLANT
WATER STERILE IRR 500ML POUR (IV SOLUTION) ×2 IMPLANT

## 2017-09-02 NOTE — Anesthesia Procedure Notes (Signed)
Procedure Name: LMA Insertion Date/Time: 09/02/2017 12:29 PM Performed by: Janeece Riggers, MD Pre-anesthesia Checklist: Patient identified, Emergency Drugs available, Suction available and Patient being monitored Patient Re-evaluated:Patient Re-evaluated prior to induction Oxygen Delivery Method: Circle system utilized Preoxygenation: Pre-oxygenation with 100% oxygen Induction Type: IV induction Ventilation: Mask ventilation without difficulty LMA: LMA inserted LMA Size: 4.0 Number of attempts: 1 Airway Equipment and Method: Bite block Placement Confirmation: positive ETCO2 Tube secured with: Tape Dental Injury: Teeth and Oropharynx as per pre-operative assessment

## 2017-09-02 NOTE — Anesthesia Preprocedure Evaluation (Signed)
Anesthesia Evaluation  Patient identified by MRN, date of birth, ID band Patient awake    Reviewed: Allergy & Precautions, NPO status , Patient's Chart, lab work & pertinent test results  History of Anesthesia Complications (+) Family history of anesthesia reaction and history of anesthetic complications (mother required postop ventilation for COPD)  Airway Mallampati: III  TM Distance: >3 FB Neck ROM: Full   Comment: Previous grade I view with [facility] 2 Dental  (+) Edentulous Upper, Dental Advisory Given   Pulmonary neg pulmonary ROS, neg shortness of breath, neg sleep apnea, neg COPD, neg recent URI,    Pulmonary exam normal breath sounds clear to auscultation       Cardiovascular hypertension, Pt. on medications (-) angina(-) Past MI, (-) Cardiac Stents, (-) Orthopnea and (-) PND + dysrhythmias (EKG from 11/21/15 shows NSR) + Valvular Problems/Murmurs  Rhythm:Regular Rate:Normal     Neuro/Psych  Headaches, neg Seizures PSYCHIATRIC DISORDERS Anxiety Depression    GI/Hepatic negative GI ROS, Neg liver ROS,   Endo/Other  diabetes (patient denies and says the metformin is for weight control), Type 2, Oral Hypoglycemic AgentsHypothyroidism   Renal/GU negative Renal ROS  Female GU complaint (urinary incontinence)     Musculoskeletal   Abdominal (+) + obese,   Peds  Hematology negative hematology ROS (+)   Anesthesia Other Findings HLD, bilateral breast cancer   Reproductive/Obstetrics                             Anesthesia Physical  Anesthesia Plan  ASA: III  Anesthesia Plan: General   Post-op Pain Management:    Induction: Intravenous  PONV Risk Score and Plan: 2 and Ondansetron, Treatment may vary due to age or medical condition, Dexamethasone and Scopolamine patch - Pre-op  Airway Management Planned: Oral ETT and LMA  Additional Equipment:   Intra-op Plan:   Post-operative  Plan: Extubation in OR  Informed Consent: I have reviewed the patients History and Physical, chart, labs and discussed the procedure including the risks, benefits and alternatives for the proposed anesthesia with the patient or authorized representative who has indicated his/her understanding and acceptance.   Dental advisory given  Plan Discussed with: CRNA and Surgeon  Anesthesia Plan Comments: (Offered patient PECs block for postop pain control. Patient declined block as she states her pain was well controlled after her previous surgery without a block. She does consent to possible postop block if pain is inadequately controlled.)        Anesthesia Quick Evaluation

## 2017-09-02 NOTE — Discharge Instructions (Signed)

## 2017-09-02 NOTE — Transfer of Care (Signed)
  Last Vitals:  Vitals Value Taken Time  BP 148/91 09/02/2017  1:10 PM  Temp    Pulse 102 09/02/2017  1:14 PM  Resp 19 09/02/2017  1:14 PM  SpO2 95 % 09/02/2017  1:14 PM  Vitals shown include unvalidated device data.  Last Pain:  Vitals:   09/02/17 1022  TempSrc:   PainSc: 2       Patients Stated Pain Goal: 8 (09/02/17 1022)  Immediate Anesthesia Transfer of Care Note  Patient: Debbie [name]  Procedure(s) Performed: Procedure(s) (LRB): HYSTEROSCOPY  to remove IUD (N/A)  Patient Location: PACU  Anesthesia Type: General  Level of Consciousness: awake, alert  and oriented  Airway & Oxygen Therapy: Patient Spontanous Breathing and Patient connected to nasal cannula oxygen  Post-op Assessment: Report given to PACU RN and Post -op Vital signs reviewed and stable  Post vital signs: Reviewed and stable  Complications: No apparent anesthesia complications

## 2017-09-02 NOTE — Anesthesia Postprocedure Evaluation (Signed)
Anesthesia Post Note  Patient: Debbie [name]  Procedure(s) Performed: HYSTEROSCOPY  to remove IUD (N/A Vagina )     Patient location during evaluation: PACU Anesthesia Type: General Level of consciousness: awake and alert Pain management: pain level controlled Vital Signs Assessment: post-procedure vital signs reviewed and stable Respiratory status: spontaneous breathing, nonlabored ventilation, respiratory function stable and patient connected to nasal cannula oxygen Cardiovascular status: blood pressure returned to baseline and stable Postop Assessment: no apparent nausea or vomiting Anesthetic complications: no    Last Vitals:  Vitals:   09/02/17 1330 09/02/17 1345  BP: (!) 155/80 (!) 148/78  Pulse: 92 92  Resp: 13 18  Temp:    SpO2: 96% 91%    Last Pain:  Vitals:   09/02/17 1345  TempSrc:   PainSc: 7                  Gottfried Standish

## 2017-09-03 ENCOUNTER — Encounter (HOSPITAL_BASED_OUTPATIENT_CLINIC_OR_DEPARTMENT_OTHER): Payer: Self-pay | Admitting: Obstetrics and Gynecology

## 2017-09-03 NOTE — Op Note (Signed)
09/02/2017  3:04 PM  PATIENT:  Debbie [name]  52 y.o. female  PRE-OPERATIVE DIAGNOSIS:  Retained IUD  POST-OPERATIVE DIAGNOSIS:  Retained IUD  PROCEDURE:  Procedure(s): HYSTEROSCOPY  to remove IUD  SURGEON:  Eldred Manges, MD  ASSISTANTS: None  ANESTHESIA:   general  ESTIMATED BLOOD LOSS: 5 mL   BLOOD ADMINISTERED:none  COMPLICATIONS: None  FINDINGS: The uterus sounded to 7 cm.  The IUD string was visible on speculum examination however traction on the string did not allow removal of the IUD.  At hysteroscopy the IUD was easily visible within the endometrial cavity low in the cavity very close to the cervix.  There was scarring of the tissue within the endometrial cavity that precluded definite visualization of the tubal ostia.  FLUID DEFICIT: 150 cc  LOCAL MEDICATIONS USED:  LIDOCAINE  and Amount: 10 ml  SPECIMEN:  Source of Specimen:  Endometrial curettings  DISPOSITION OF SPECIMEN:  PATHOLOGY  COUNTS:  YES  DESCRIPTION OF PROCEDURE:the patient was taken to the operating room after appropriate identification and placed on the operating table. After the attainment of adequate general anesthesia she was placed in the lithotomy position. The perineum and vagina were prepped with multiple layers of Betadine. The bladder was emptied with a an in and out catheter. The perineum was draped in sterile field. A graves speculum was placed in the vagina.  The easily visible IUD string was grasped with a uterine dressing forceps and  traction applied without the IUD being removed.  The cervix was grasped with a single-tooth tenaculum. A paracervical block was achieved with a total of 10 cc of 2% Xylocaine and the 5 and 7:00 positions. The uterus was sounded.  The cervix was then dilated to accommodate the diagnostic hysteroscope. The hysteroscope was used to evaluate all quadrants of the uterus.  The above-noted findings were documented.  A uterine dressing forceps was then used  through the cervix to grasp the  of the IUD and with traction the entire IUD was removed intact.  The hysteroscope was reinserted and complete removal of the IUD documented.  A sharp curette was then used to curette all quadrants of the uterus with a small amount of tissue obtained.  The hysteroscope was again inserted the tubal ostia were not readily apparent.  Hysteroscope was removed.  All instruments were then removed from the vagina and the patient was awakened from general anesthesia and taken to the recovery room in satisfactory condition having tolerated the procedure well sponge and instrument counts correct.  PLAN OF CARE: Discharge home after post anesthesia care  PATIENT DISPOSITION:  PACU - hemodynamically stable.   Delay start of Pharmacological VTE agent (>24hrs) due to surgical blood loss or risk of bleeding: No pharmacologic VTE.  SCDs were used throughout the entire procedure   Eldred Manges, MD 3:04 PM

## 2017-09-06 ENCOUNTER — Other Ambulatory Visit: Payer: Self-pay | Admitting: Radiology

## 2017-09-07 ENCOUNTER — Ambulatory Visit (HOSPITAL_COMMUNITY)
Admission: RE | Admit: 2017-09-07 | Discharge: 2017-09-07 | Disposition: A | Discharge status: Home / Self Care | Payer: 59 | Source: Ambulatory Visit | Attending: Oncology | Admitting: Oncology

## 2017-09-07 ENCOUNTER — Encounter (HOSPITAL_COMMUNITY): Payer: Self-pay

## 2017-09-07 DIAGNOSIS — Z9013 Acquired absence of bilateral breasts and nipples: Secondary | ICD-10-CM | POA: Diagnosis not present

## 2017-09-07 DIAGNOSIS — Z9851 Tubal ligation status: Secondary | ICD-10-CM | POA: Diagnosis not present

## 2017-09-07 DIAGNOSIS — C787 Secondary malignant neoplasm of liver and intrahepatic bile duct: Secondary | ICD-10-CM | POA: Insufficient documentation

## 2017-09-07 DIAGNOSIS — K7689 Other specified diseases of liver: Secondary | ICD-10-CM | POA: Diagnosis not present

## 2017-09-07 DIAGNOSIS — E785 Hyperlipidemia, unspecified: Secondary | ICD-10-CM | POA: Diagnosis not present

## 2017-09-07 DIAGNOSIS — K760 Fatty (change of) liver, not elsewhere classified: Secondary | ICD-10-CM | POA: Diagnosis not present

## 2017-09-07 DIAGNOSIS — C7951 Secondary malignant neoplasm of bone: Secondary | ICD-10-CM | POA: Diagnosis not present

## 2017-09-07 DIAGNOSIS — C50919 Malignant neoplasm of unspecified site of unspecified female breast: Secondary | ICD-10-CM | POA: Diagnosis not present

## 2017-09-07 DIAGNOSIS — Z79890 Hormone replacement therapy (postmenopausal): Secondary | ICD-10-CM | POA: Insufficient documentation

## 2017-09-07 DIAGNOSIS — Z8249 Family history of ischemic heart disease and other diseases of the circulatory system: Secondary | ICD-10-CM | POA: Insufficient documentation

## 2017-09-07 DIAGNOSIS — Z888 Allergy status to other drugs, medicaments and biological substances status: Secondary | ICD-10-CM | POA: Insufficient documentation

## 2017-09-07 DIAGNOSIS — Z9889 Other specified postprocedural states: Secondary | ICD-10-CM | POA: Diagnosis not present

## 2017-09-07 DIAGNOSIS — E039 Hypothyroidism, unspecified: Secondary | ICD-10-CM | POA: Diagnosis not present

## 2017-09-07 DIAGNOSIS — Z800 Family history of malignant neoplasm of digestive organs: Secondary | ICD-10-CM | POA: Insufficient documentation

## 2017-09-07 DIAGNOSIS — I10 Essential (primary) hypertension: Secondary | ICD-10-CM | POA: Diagnosis not present

## 2017-09-07 DIAGNOSIS — Z79899 Other long term (current) drug therapy: Secondary | ICD-10-CM | POA: Insufficient documentation

## 2017-09-07 DIAGNOSIS — Z833 Family history of diabetes mellitus: Secondary | ICD-10-CM | POA: Diagnosis not present

## 2017-09-07 DIAGNOSIS — C50112 Malignant neoplasm of central portion of left female breast: Secondary | ICD-10-CM | POA: Insufficient documentation

## 2017-09-07 DIAGNOSIS — Z7984 Long term (current) use of oral hypoglycemic drugs: Secondary | ICD-10-CM | POA: Insufficient documentation

## 2017-09-07 DIAGNOSIS — Z170 Estrogen receptor positive status [ER+]: Secondary | ICD-10-CM | POA: Insufficient documentation

## 2017-09-07 LAB — CBC WITH DIFFERENTIAL/PLATELET
Basophils Absolute: 0 10*3/uL (ref 0.0–0.1)
Basophils Relative: 1 %
Eosinophils Absolute: 0.1 10*3/uL (ref 0.0–0.7)
Eosinophils Relative: 2 %
HCT: 41 % (ref 36.0–46.0)
Hemoglobin: 13.5 g/dL (ref 12.0–15.0)
Lymphocytes Relative: 28 %
Lymphs Abs: 1.1 10*3/uL (ref 0.7–4.0)
MCH: 30.4 pg (ref 26.0–34.0)
MCHC: 32.9 g/dL (ref 30.0–36.0)
MCV: 92.3 fL (ref 78.0–100.0)
Monocytes Absolute: 0.1 10*3/uL (ref 0.1–1.0)
Monocytes Relative: 3 %
Neutro Abs: 2.7 10*3/uL (ref 1.7–7.7)
Neutrophils Relative %: 66 %
Platelets: 265 10*3/uL (ref 150–400)
RBC: 4.44 MIL/uL (ref 3.87–5.11)
RDW: 13.2 % (ref 11.5–15.5)
WBC: 4 10*3/uL (ref 4.0–10.5)

## 2017-09-07 LAB — PROTIME-INR
INR: 0.88
Prothrombin Time: 11.9 seconds (ref 11.4–15.2)

## 2017-09-07 LAB — COMPREHENSIVE METABOLIC PANEL
ALT: 45 U/L (ref 14–54)
AST: 31 U/L (ref 15–41)
Albumin: 3.8 g/dL (ref 3.5–5.0)
Alkaline Phosphatase: 128 U/L — ABNORMAL HIGH (ref 38–126)
Anion gap: 11 (ref 5–15)
BUN: 12 mg/dL (ref 6–20)
CO2: 24 mmol/L (ref 22–32)
Calcium: 9.6 mg/dL (ref 8.9–10.3)
Chloride: 107 mmol/L (ref 101–111)
Creatinine, Ser: 0.99 mg/dL (ref 0.44–1.00)
GFR calc Af Amer: >60 mL/min (ref >60)
GFR calc non Af Amer: >60 mL/min (ref >60)
Glucose, Bld: 87 mg/dL (ref 65–99)
Potassium: 3.9 mmol/L (ref 3.5–5.1)
Sodium: 142 mmol/L (ref 135–145)
Total Bilirubin: 0.6 mg/dL (ref 0.3–1.2)
Total Protein: 7.9 g/dL (ref 6.5–8.1)

## 2017-09-07 MED ORDER — MIDAZOLAM HCL 2 MG/2ML IJ SOLN
INTRAMUSCULAR | Status: AC | PRN
  Administered 2017-09-07 (×2): 1 mg via INTRAVENOUS

## 2017-09-07 MED ORDER — OXYCODONE HCL 5 MG PO TABS: 5.0000 mg | ORAL_TABLET | ORAL | Status: DC | PRN

## 2017-09-07 MED ORDER — FENTANYL CITRATE (PF) 100 MCG/2ML IJ SOLN
INTRAMUSCULAR | Status: AC
  Filled 2017-09-07: qty 2

## 2017-09-07 MED ORDER — LIDOCAINE HCL 1 % IJ SOLN
INTRAMUSCULAR | Status: AC
  Filled 2017-09-07: qty 20

## 2017-09-07 MED ORDER — GELATIN ABSORBABLE 12-7 MM EX MISC
CUTANEOUS | Status: AC
  Filled 2017-09-07: qty 1

## 2017-09-07 MED ORDER — MIDAZOLAM HCL 2 MG/2ML IJ SOLN
INTRAMUSCULAR | Status: AC
  Filled 2017-09-07: qty 2

## 2017-09-07 MED ORDER — SODIUM CHLORIDE 0.9 % IV SOLN: INTRAVENOUS | Status: DC

## 2017-09-07 MED ORDER — FENTANYL CITRATE (PF) 100 MCG/2ML IJ SOLN
INTRAMUSCULAR | Status: AC | PRN
  Administered 2017-09-07 (×2): 50 ug via INTRAVENOUS

## 2017-09-07 NOTE — Procedures (Signed)
US guided core biopsies of right hepatic lesion. Very difficult procedure due to liver appearance. Liver is very hyperechoic with fatty infiltration.  Scattered small hypoechoic lesions. 6 core biopsies obtained from a hypoechoic lesion. Gelfoam placed along biopsy tract.  Minimal blood loss and no immediate complication.

## 2017-09-07 NOTE — Consult Note (Signed)
Chief Complaint: Patient was seen in consultation today for image guided liver lesion biopsy   Referring Physician(s): Debbie [name]  Supervising Physician: Debbie [name]  Patient Status: Debbie [name] - Out-pt  History of Present Illness: Debbie [name] is a 52 y.o. female with history of bilateral breast cancers diagnosed in 2017 with prior double mastectomies, radiation therapy and chemotherapy.  Recent follow-up imaging has revealed numerous enhancing liver lesions as well as osseous metastases and redemonstration of 3 small pulmonary nodules in the left lower lobe.  Request now received for image guided liver lesion biopsy for further evaluation.  Past Medical History:  Diagnosis Date  . Anxiety   . Bone metastasis (Debbie [name])   . Breast cancer of upper-inner quadrant of right female breast Debbie [name]) oncologist-  Debbie Debbie [name]--- 04/ 2019 ER/PR+ Stage IV w/ metastatic disease to liver, total spine, lung, bone   dx 06/ 2017  right breast invasive ductal carcinoma, Stage IIB, ER/PR + (pT2 pN1), grade 1-- s/p  right breast lumpecotmy w/ sln bx's and bilateral breast reduction 11-28-2015/  left breast with reduction , dx invasive ductal ca, Grade 1 (pT1cpNX) ER/PR positive/  adjuvant radiation completed 03-27-2016 to right chest wall, 2018  reclassifiec Stage IIA  . Cancer, metastatic to liver (Debbie [name])   . Chronic back pain   . Depression   . Family history of adverse reaction to anesthesia     mother has Copd was kept on vent   . Headache   . Heart murmur   . History of hyperthyroidism    s/p  RAI  . History of radiation therapy 02/11/16- 03/27/16   Right Chest Wall 50.4 Gy in 28 fractions, Right Chest Wall Boost 10 Gy in 5 fractions.   . Hyperlipidemia   . Hypertension   . Hypothyroidism, postradioiodine therapy    endocrinoloigst-  Debbie [name]--  2004  s/p  RAI  . Menorrhagia 2011  . Multiple pulmonary nodules    secondary  metastatic  . PONV (postoperative nausea and vomiting)   . SUI  (stress urinary incontinence, female)   . Wears contact lenses   . Wears dentures    full upper and lower partial    Past Surgical History:  Procedure Laterality Date  . BREAST LUMPECTOMY WITH NEEDLE LOCALIZATION AND AXILLARY SENTINEL LYMPH NODE BX Right 11/28/2015   Procedure: RIGHT BREAST LUMPECTOMY WITH NEEDLE LOCALIZATION AND AXILLARY SENTINEL LYMPH NODE BX;  Surgeon: Debbie Messing III, MD;  Location: [facility];  Service: General;  Laterality: Right;  . BREAST REDUCTION SURGERY Bilateral 11/28/2015   Procedure: MAMMARY REDUCTION  (BREAST) BILATERAL;  Surgeon: Debbie Reese, MD;  Location: [facility];  Service: Plastics;  Laterality: Bilateral;  . CESAREAN SECTION  01/12/1993  . COLONOSCOPY    . HYSTEROSCOPY N/A 09/02/2017   Procedure: HYSTEROSCOPY  to remove IUD;  Surgeon: Debbie Manges, MD;  Location: [facility];  Service: Gynecology;  Laterality: N/A;  . MASTECTOMY W/ SENTINEL NODE BIOPSY Bilateral 12/26/2015   Procedure: BILATERAL MASTECTOMY WITH LEFT SENTINEL LYMPH NODE BIOPSY;  Surgeon: Debbie Messing III, MD;  Location: [place];  Service: General;  Laterality: Bilateral;  . TUBAL LIGATION Bilateral 1995    Allergies: Ace inhibitors; Atorvastatin; and Simvastatin  Medications: Prior to Admission medications   Medication Sig Start Date End Date Taking? Authorizing Provider  acetaminophen (TYLENOL) 325 MG tablet Take 650 mg by mouth every 6 (six) hours as needed for mild pain. Reported on 10/23/2015   Yes [provider]  Calcium-Magnesium  250-125 MG TABS Take 1 tablet by mouth daily. 04/09/16  Yes [name], Debbie Dad, MD  Cetirizine HCl (ZYRTEC ALLERGY PO) Take 1 tablet by mouth daily as needed (allergies).    Yes [provider]  hydrochlorothiazide (MICROZIDE) 12.5 MG capsule Take 12.5 mg by mouth every morning.    Yes [provider]  ibuprofen (ADVIL,MOTRIN) 600 MG tablet Take 1 tablet (600 mg total) by mouth every 6 (six) hours as needed for moderate  pain. 09/02/17  Yes Haygood, Debbie Bars, MD  levothyroxine (SYNTHROID, LEVOTHROID) 137 MCG tablet Take 137 mcg by mouth daily before breakfast.   Yes [provider]  LIVALO 2 MG TABS Take 0.2 mg by mouth daily.  08/29/15  Yes [provider]  losartan (COZAAR) 100 MG tablet Take 100 mg by mouth every morning.    Yes [provider]  metFORMIN (GLUCOPHAGE-XR) 750 MG 24 hr tablet Take 750 mg by mouth every evening.  07/26/15  Yes [provider]  Multiple Vitamin (MULTIVITAMIN) tablet Take 1 tablet by mouth daily.   Yes [provider]  omega-3 acid ethyl esters (LOVAZA) 1 g capsule Take 1 g by mouth 2 (two) times daily.    Yes [provider]  palbociclib (IBRANCE) 125 MG capsule Take 1 capsule (125 mg total) by mouth daily with lunch. Take for 21 days on, 7 days off, repeat every 28 days. Take whole with food. 08/18/17  Yes [name], Debbie Dad, MD  potassium chloride SA (K-DUR,KLOR-CON) 20 MEQ tablet Take 40 mEq by mouth 2 (two) times daily.    Yes [provider]  Vitamin D, Cholecalciferol, 1000 units CAPS Take 1 tablet by mouth daily. 04/09/16  Yes [name], Debbie Dad, MD  levonorgestrel (MIRENA) 20 MCG/24HR IUD 1 each by Intrauterine route once.    [provider]  traMADol (ULTRAM) 50 MG tablet Take 1 tablet (50 mg total) by mouth every 6 (six) hours as needed. 08/13/17   [name], Debbie Dad, MD     Family History  Problem Relation Age of Onset  . Diabetes Mother   . Hypertension Mother   . Hypertension Father   . Diabetes Maternal Grandmother   . Hypertension Sister   . Cancer Paternal Grandmother        colon  . Colon cancer Paternal Grandmother   . Esophageal cancer Neg Hx   . Stomach cancer Neg Hx   . Rectal cancer Neg Hx     Social History   Socioeconomic History  . Marital status: Married    Spouse name: Not on file  . Number of children: Not on file  . Years of education: Not on file  . Highest education  level: Not on file  Occupational History  . Not on file  Social Needs  . Financial resource strain: Not on file  . Food insecurity:    Worry: Not on file    Inability: Not on file  . Transportation needs:    Medical: Not on file    Non-medical: Not on file  Tobacco Use  . Smoking status: Never Smoker  . Smokeless tobacco: Never Used  Substance and Sexual Activity  . Alcohol use: No  . Drug use: No  . Sexual activity: Not on file    Comment: BTL   Lifestyle  . Physical activity:    Days per week: Not on file    Minutes per session: Not on file  . Stress: Not on file  Relationships  . Social  connections:    Talks on phone: Not on file    Gets together: Not on file    Attends religious service: Not on file    Active member of club or organization: Not on file    Attends meetings of clubs or organizations: Not on file    Relationship status: Not on file  Other Topics Concern  . Not on file  Social History Narrative  . Not on file      Review of Systems denies fever, headache, chest pain, dyspnea, nausea, vomiting; she does have occasional cough, back pain, left groin discomfort and occasional right upper quadrant cramping as well as bloating; she also has some minimal vaginal spotting status post recent IUD removal  Vital Signs: BP (!) 158/92   Pulse 87   Temp 98.3 F (36.8 [name]) (Oral)   Resp 18   SpO2 99%   Physical Exam awake, alert.  Chest clear to auscultation bilaterally.  Heart with regular rate and rhythm.  Abdomen obese, soft, positive bowel sounds, currently nontender.  Extremities with full range of motion  Imaging: Ct Head W Wo Contrast  Result Date: 08/26/2017 CLINICAL DATA:  Metastatic breast cancer. Blurry vision and vertigo. EXAM: CT HEAD WITHOUT AND WITH CONTRAST TECHNIQUE: Contiguous axial images were obtained from the base of the skull through the vertex without and with intravenous contrast CONTRAST:  38mL ISOVUE-300 IOPAMIDOL (ISOVUE-300) INJECTION  61% COMPARISON:  None. FINDINGS: BRAIN: No intraparenchymal hemorrhage, mass effect nor midline shift. The ventricles and sulci are normal. No acute large vascular territory infarcts. No abnormal extra-axial fluid collections. Basal cisterns are patent. VASCULAR: Trace calcific atherosclerosis carotid siphon. SKULL/SOFT TISSUES: No skull fracture. No significant soft tissue swelling. Empty J-shaped sella. ORBITS/SINUSES: The included ocular globes and orbital contents are normal.The mastoid aircells and included paranasal sinuses are well-aerated. OTHER: None. IMPRESSION: 1. No acute intracranial process. 2. Empty sella, otherwise unremarkable noncontrast CT HEAD for age. Electronically Signed   By: Elon Alas M.D.   On: 08/26/2017 21:32   Ct Chest W Contrast  Result Date: 08/16/2017 CLINICAL DATA:  Breast cancer. Staging. Bilateral primaries. Bilateral mastectomy 12/26/2015. EXAM: CT CHEST WITH CONTRAST TECHNIQUE: Multidetector CT imaging of the chest was performed during intravenous contrast administration. CONTRAST:  24mL ISOVUE-300 IOPAMIDOL (ISOVUE-300) INJECTION 61% COMPARISON:  Spinal MRI of 08/12/2017. Abdominopelvic CT 08/04/2017. No prior chest CT. Chest radiograph 07/16/2004 reviewed. FINDINGS: Cardiovascular: Mild cardiomegaly, without pericardial effusion Mediastinum/Nodes: No supraclavicular adenopathy. Right axillary node dissection. No axillary or subpectoral adenopathy. Borderline low right mediastinal adenopathy at 10 mm on image 2/image 81/2. Right infrahilar adenopathy at 1.5 by 2.0 cm on image 84/2. Tiny hiatal hernia. Lungs/Pleura: No pleural fluid. Right apical and less so anterior right upper lobe interstitial thickening are likely radiation induced. Scattered bilateral pulmonary nodules. These are identified on series 5. Examples within the right middle lobe at 5 mm on image 80/5, 9 mm in the left lower lobe on image 80/5, 6 mm on the left hemidiaphragm on image 130/5, 5 mm in the  right apex on image 43/5. Upper Abdomen: Marked hepatic steatosis. Heterogeneous density throughout. Normal imaged portions of the spleen, pancreas, kidneys. Minimal left adrenal nodularity. Musculoskeletal: Bilateral mastectomy. Widespread osseous metastasis, as on MRI. IMPRESSION: 1. Multiple bilateral pulmonary nodules, most consistent with metastatic disease. 2. significant right infrahilar adenopathy is likely due to metastasis. Borderline low right mediastinal adenopathy is indeterminate. 3. Hepatic steatosis.  Hepatic metastasis are more apparent on MRI. 4. Tiny hiatal hernia. 5. Osseous  metastasis. Electronically Signed   By: Abigail Miyamoto M.D.   On: 08/16/2017 08:36   Mr Liver W QJ Contrast  Result Date: 08/12/2017 CLINICAL DATA:  Right breast cancer. Staging evaluation. Abdominal pain and bloating. Suspected liver masses on recent CT study. EXAM: MRI ABDOMEN WITHOUT AND WITH CONTRAST TECHNIQUE: Multiplanar multisequence MR imaging of the abdomen was performed both before and after the administration of intravenous contrast. CONTRAST:  29mL MULTIHANCE GADOBENATE DIMEGLUMINE 529 MG/ML IV SOLN COMPARISON:  08/04/2017 CT abdomen/pelvis. FINDINGS: Lower chest: Redemonstrated are 3 basilar left lower lobe pulmonary nodules measuring up to 5 mm (series 5/image 11). Hepatobiliary: Normal liver size and configuration. Moderate to severe diffuse hepatic steatosis. There are numerous (greater than 20) similar-appearing small liver masses scattered throughout the liver, each demonstrating mild T2 hyperintensity and heterogeneous targetoid enhancement, compatible with liver metastases. Representative liver masses include a 1.8 x 1.7 cm segment 7 right liver lobe mass (series 5/image 12), a 1.4 x 1.3 cm segment 6 right liver lobe mass (series 5/image 24) and a 1.1 x 0.9 cm anterior segment 8 right liver lobe mass (series 5/image 15). Normal gallbladder with no cholelithiasis. No biliary ductal dilatation. Common bile  duct diameter 3 mm. No choledocholithiasis. Pancreas: No pancreatic mass or duct dilation.  No pancreas divisum. Spleen: Normal size. No mass. Adrenals/Urinary Tract: Normal adrenals. No hydronephrosis. Small simple parapelvic renal cysts in the left kidney. No suspicious renal masses. Stomach/Bowel: Grossly normal stomach. Visualized small and large bowel is normal caliber, with no bowel wall thickening. Vascular/Lymphatic: Normal caliber abdominal aorta. Patent portal, splenic, hepatic and renal veins. No pathologically enlarged lymph nodes in the abdomen. Other: No abdominal ascites or focal fluid collection. Musculoskeletal: There are innumerable patchy confluent enhancing osseous lesions throughout the visualized thoracolumbar spine. IMPRESSION: 1. Numerous (greater than 20) similar small enhancing liver masses scattered throughout the liver, with MRI features compatible with hepatic metastases. 2. Innumerable patchy confluent Hansen osseous metastases throughout the visualized thoracolumbar spine. 3. Redemonstration of 3 small pulmonary nodules in the basilar left lower lobe, better seen on the 08/04/2017 CT abdomen study. Small pulmonary metastases not excluded. Recommend attention on short-term chest CT follow-up in 3 months. 4. Diffuse hepatic steatosis. Electronically Signed   By: Ilona Sorrel M.D.   On: 08/12/2017 09:38   Mr Total Spine Mets Screening  Result Date: 08/13/2017 CLINICAL DATA:  Invasive breast cancer.  Screening spinal MRI. EXAM: MRI TOTAL SPINE WITHOUT AND WITH CONTRAST TECHNIQUE: Multisequence MR imaging of the spine from the cervical spine to the sacrum was performed prior to and following IV contrast administration for evaluation of spinal metastatic disease. CONTRAST:  17mL MULTIHANCE GADOBENATE DIMEGLUMINE 529 MG/ML IV SOLN COMPARISON:  None. FINDINGS: MRI CERVICAL SPINE FINDINGS Alignment: Straightening. Slight degenerative retrolisthesis at C5-6. Vertebrae: Right C4 articular  process lesion. Negative for fracture. Cord: Normal signal and morphology Posterior Fossa, vertebral arteries, paraspinal tissues: No visualized mass. Disc levels: Disc narrowing and bulging C3-4 to C5-6. No visible cord impingement. No notable facet arthropathy. MRI THORACIC SPINE FINDINGS Alignment:  Normal Vertebrae: Sparing T1 there is metastatic disease in each vertebra. The T4 deposit is small and at the spinous process. The largest is a confluent metastasis in the T11 body and left pedicle eroding the posterior cortex without visible epidural tumor. Negative for fracture. Cord: Normal signal and morphology. No evidence of intrathecal metastasis. Paraspinal and other soft tissues: Negative Disc levels: No visible degenerative impingement. MRI LUMBAR SPINE FINDINGS Segmentation: When numbered from above there  are 4 lumbar type vertebral bodies. On a 2006 chest x-ray there are bilateral cervical ribs and 11 seen thoracic ribs. Alignment:  Normal Vertebrae: Metastatic lesions throughout each vertebrae, most extensive in the L2 body where there is a superior endplate fracture that is chronic appearing based on STIR appearance. No visible epidural tumor. Conus medullaris: Extends to the L1-2 level and appears normal. Paraspinal and other soft tissues: Recent abdominal CT. No new finding. Disc levels: Mid lower lumbar disc bulging. L5-S1 degenerative facet spurring. No degenerative impingement. IMPRESSION: Cervical spine: C4 small right articular process metastasis. No extra osseous tumor or fracture. Thoracic spine: 1. Sparing T1 there is metastatic disease in each vertebrae. The largest is at the T11 vertebral body and left pedicle with posterior cortex erosion but no visible extra osseous tumor. 2. Negative for fracture. Lumbar spine: 1. Four lumbar type vertebral bodies. 2. Metastatic disease at each level, greatest in the L2 body which shows a chronic superior endplate fracture. No evidence of extra osseous  tumor. Electronically Signed   By: Monte Fantasia M.D.   On: 08/13/2017 10:21    Labs:  CBC: Recent Labs    06/22/17 1147 08/06/17 1052 08/17/17 1541 09/07/17 1140  WBC 5.7 6.1 6.8 4.0  HGB 13.3 13.1 13.4 13.5  HCT 40.0 39.3 40.2 41.0  PLT 256 251 272 265    COAGS: Recent Labs    09/07/17 1140  INR 0.88    BMP: Recent Labs    06/22/17 1147 08/06/17 1052 08/17/17 1541  NA 143 139 140  K 4.3 4.1 4.2  CL 107 105 105  CO2 26 24 26   GLUCOSE 94 99 104  BUN 11 13 12   CALCIUM 9.8 10.0 9.9  CREATININE 0.90 0.86 0.94  GFRNONAA >60 >60 >60  GFRAA >60 >60 >60    LIVER FUNCTION TESTS: Recent Labs    06/22/17 1147 08/06/17 1052 08/17/17 1541  BILITOT 0.4 0.3 0.3  AST 31 34 44*  ALT 45 48 52  ALKPHOS 135 127 135  PROT 7.5 7.5 7.5  ALBUMIN 3.5 3.6 3.7    TUMOR MARKERS: No results for input(s): AFPTM, CEA, CA199, CHROMGRNA in the last 8760 hours.  Assessment and Plan:  52 y.o. female with history of bilateral breast cancers diagnosed in 2017 with prior double mastectomies, radiation therapy and chemotherapy.  Recent follow-up imaging has revealed numerous enhancing liver lesions as well as osseous metastases and redemonstration of 3 small pulmonary nodules in the left lower lobe.  Request now received for image guided liver lesion biopsy for further evaluation.Risks and benefits discussed with the patient /spouse including, but not limited to bleeding, infection, damage to adjacent structures or low yield requiring additional tests.  All of the patient's questions were answered, patient is agreeable to proceed. Consent signed and in chart.      Thank you for this interesting consult.  I greatly enjoyed meeting Debbie [name] and look forward to participating in their care.  A copy of this report was sent to the requesting provider on this date.  Electronically Signed: D. Rowe Robert, PA-[name] 09/07/2017, 12:05 PM   I spent a total of 25 minutes    in face  to face in clinical consultation, greater than 50% of which was counseling/coordinating care for image guided liver lesion biopsy

## 2017-09-07 NOTE — Discharge Instructions (Signed)
You may remove dressing and bathe in 24 hours.    Liver Biopsy, Care After These instructions give you information on caring for yourself after your procedure. Your doctor may also give you more specific instructions. Call your doctor if you have any problems or questions after your procedure. Follow these instructions at home:  Rest at home for 1-2 days or as told by your doctor.  Have someone stay with you for at least 24 hours.  Do not do these things in the first 24 hours: ? Drive. ? Use machinery. ? Take care of other people. ? Sign legal documents. ? Take a bath or shower.  There are many different ways to close and cover a cut (incision). For example, a cut can be closed with stitches, skin glue, or adhesive strips. Follow your doctor's instructions on: ? Taking care of your cut. ? Changing and removing your bandage (dressing). ? Removing whatever was used to close your cut.  Do not drink alcohol in the first week.  Do not lift more than 5 pounds or play contact sports for the first 2 weeks.  Take medicines only as told by your doctor. For 1 week, do not take medicine that has aspirin in it or medicines like ibuprofen.  Get your test results. Contact a doctor if:  A cut bleeds and leaves more than just a small spot of blood.  A cut is red, puffs up (swells), or hurts more than before.  Fluid or something else comes from a cut.  A cut smells bad.  You have a fever or chills. Get help right away if:  You have swelling, bloating, or pain in your belly (abdomen).  You get dizzy or faint.  You have a rash.  You feel sick to your stomach (nauseous) or throw up (vomit).  You have trouble breathing, feel short of breath, or feel faint.  Your chest hurts.  You have problems talking or seeing.  You have trouble balancing or moving your arms or legs. This information is not intended to replace advice given to you by your health care provider. Make sure you  discuss any questions you have with your health care provider. Document Released: 02/04/2008 Document Revised: 10/03/2015 Document Reviewed: 06/23/2013 Elsevier Interactive Patient Education  2018 [employer].    Moderate Conscious Sedation, Adult, Care After These instructions provide you with information about caring for yourself after your procedure. Your health care provider may also give you more specific instructions. Your treatment has been planned according to current medical practices, but problems sometimes occur. Call your health care provider if you have any problems or questions after your procedure. What can I expect after the procedure? After your procedure, it is common:  To feel sleepy for several hours.  To feel clumsy and have poor balance for several hours.  To have poor judgment for several hours.  To vomit if you eat too soon.  Follow these instructions at home: For at least 24 hours after the procedure:   Do not: ? Participate in activities where you could fall or become injured. ? Drive. ? Use heavy machinery. ? Drink alcohol. ? Take sleeping pills or medicines that cause drowsiness. ? Make important decisions or sign legal documents. ? Take care of children on your own.  Rest. Eating and drinking  Follow the diet recommended by your health care provider.  If you vomit: ? Drink water, juice, or soup when you can drink without vomiting. ? Make sure you  have little or no nausea before eating solid foods. General instructions  Have a responsible adult stay with you until you are awake and alert.  Take over-the-counter and prescription medicines only as told by your health care provider.  If you smoke, do not smoke without supervision.  Keep all follow-up visits as told by your health care provider. This is important. Contact a health care provider if:  You keep feeling nauseous or you keep vomiting.  You feel light-headed.  You develop a  rash.  You have a fever. Get help right away if:  You have trouble breathing. This information is not intended to replace advice given to you by your health care provider. Make sure you discuss any questions you have with your health care provider. Document Released: 02/15/2013 Document Revised: 09/30/2015 Document Reviewed: 08/17/2015 Elsevier Interactive Patient Education  [employer].

## 2017-09-08 MED FILL — POTASSIUM CL ER 20 MEQ TABL: 20 | 90 days supply | Qty: 360 | Fill #2

## 2017-09-08 MED FILL — LIVALO 2 MG TABLET: 2 | 90 days supply | Qty: 90 | Fill #1

## 2017-09-13 ENCOUNTER — Other Ambulatory Visit: Payer: Self-pay | Admitting: Oncology

## 2017-09-14 ENCOUNTER — Telehealth: Payer: Self-pay | Admitting: Adult Health

## 2017-09-14 ENCOUNTER — Encounter: Payer: Self-pay | Admitting: Adult Health

## 2017-09-14 ENCOUNTER — Ambulatory Visit: Payer: 59

## 2017-09-14 ENCOUNTER — Inpatient Hospital Stay (HOSPITAL_BASED_OUTPATIENT_CLINIC_OR_DEPARTMENT_OTHER): Payer: 59 | Admitting: Adult Health

## 2017-09-14 ENCOUNTER — Inpatient Hospital Stay: Payer: 59

## 2017-09-14 ENCOUNTER — Inpatient Hospital Stay: Payer: 59 | Attending: Oncology

## 2017-09-14 VITALS — BP 151/78 | HR 92 | Temp 98.4°F | Resp 18 | Ht 66.0 in | Wt 257.2 lb

## 2017-09-14 DIAGNOSIS — Z79818 Long term (current) use of other agents affecting estrogen receptors and estrogen levels: Secondary | ICD-10-CM | POA: Diagnosis not present

## 2017-09-14 DIAGNOSIS — C50211 Malignant neoplasm of upper-inner quadrant of right female breast: Secondary | ICD-10-CM | POA: Insufficient documentation

## 2017-09-14 DIAGNOSIS — Z170 Estrogen receptor positive status [ER+]: Secondary | ICD-10-CM

## 2017-09-14 DIAGNOSIS — C7951 Secondary malignant neoplasm of bone: Secondary | ICD-10-CM

## 2017-09-14 DIAGNOSIS — Z923 Personal history of irradiation: Secondary | ICD-10-CM | POA: Diagnosis not present

## 2017-09-14 DIAGNOSIS — C787 Secondary malignant neoplasm of liver and intrahepatic bile duct: Secondary | ICD-10-CM | POA: Diagnosis not present

## 2017-09-14 DIAGNOSIS — C50011 Malignant neoplasm of nipple and areola, right female breast: Secondary | ICD-10-CM

## 2017-09-14 DIAGNOSIS — C50112 Malignant neoplasm of central portion of left female breast: Secondary | ICD-10-CM

## 2017-09-14 DIAGNOSIS — C50012 Malignant neoplasm of nipple and areola, left female breast: Secondary | ICD-10-CM

## 2017-09-14 LAB — CBC WITH DIFFERENTIAL/PLATELET
Basophils Absolute: 0 10*3/uL (ref 0.0–0.1)
Basophils Relative: 0 %
Eosinophils Absolute: 0.1 10*3/uL (ref 0.0–0.5)
Eosinophils Relative: 4 %
HCT: 37.9 % (ref 34.8–46.6)
Hemoglobin: 12.4 g/dL (ref 11.6–15.9)
Lymphocytes Relative: 31 %
Lymphs Abs: 0.8 10*3/uL — ABNORMAL LOW (ref 0.9–3.3)
MCH: 31.2 pg (ref 25.1–34.0)
MCHC: 32.7 g/dL (ref 31.5–36.0)
MCV: 95.2 fL (ref 79.5–101.0)
Monocytes Absolute: 0.1 10*3/uL (ref 0.1–0.9)
Monocytes Relative: 4 %
Neutro Abs: 1.5 10*3/uL (ref 1.5–6.5)
Neutrophils Relative %: 61 %
Platelets: 225 10*3/uL (ref 145–400)
RBC: 3.98 MIL/uL (ref 3.70–5.45)
RDW: 13.5 % (ref 11.2–14.5)
WBC: 2.5 10*3/uL — ABNORMAL LOW (ref 3.9–10.3)

## 2017-09-14 LAB — COMPREHENSIVE METABOLIC PANEL
ALT: 42 U/L (ref 0–55)
AST: 29 U/L (ref 5–34)
Albumin: 3.7 g/dL (ref 3.5–5.0)
Alkaline Phosphatase: 144 U/L (ref 40–150)
Anion gap: 7 (ref 3–11)
BUN: 13 mg/dL (ref 7–26)
CO2: 26 mmol/L (ref 22–29)
Calcium: 10.1 mg/dL (ref 8.4–10.4)
Chloride: 108 mmol/L (ref 98–109)
Creatinine, Ser: 1.07 mg/dL (ref 0.60–1.10)
GFR calc Af Amer: >60 mL/min (ref >60)
GFR calc non Af Amer: 58 mL/min — ABNORMAL LOW (ref >60)
Glucose, Bld: 91 mg/dL (ref 70–140)
Potassium: 4.3 mmol/L (ref 3.5–5.1)
Sodium: 141 mmol/L (ref 136–145)
Total Bilirubin: 0.3 mg/dL (ref 0.2–1.2)
Total Protein: 7.5 g/dL (ref 6.4–8.3)

## 2017-09-14 MED ORDER — DENOSUMAB 120 MG/1.7ML ~~LOC~~ SOLN
120.0000 mg | Freq: Once | SUBCUTANEOUS | Status: AC
  Administered 2017-09-14: 120 mg via SUBCUTANEOUS

## 2017-09-14 MED ORDER — DENOSUMAB 120 MG/1.7ML ~~LOC~~ SOLN
SUBCUTANEOUS | Status: AC
  Filled 2017-09-14: qty 1.7

## 2017-09-14 MED ORDER — FULVESTRANT 250 MG/5ML IM SOLN
500.0000 mg | Freq: Once | INTRAMUSCULAR | Status: AC
  Administered 2017-09-14: 500 mg via INTRAMUSCULAR

## 2017-09-14 MED ORDER — FULVESTRANT 250 MG/5ML IM SOLN
INTRAMUSCULAR | Status: AC
  Filled 2017-09-14: qty 5

## 2017-09-14 NOTE — Telephone Encounter (Signed)
Per 5/6 no los °

## 2017-09-14 NOTE — Patient Instructions (Signed)
Denosumab injection What is this medicine? DENOSUMAB (den oh sue mab) slows bone breakdown. Prolia is used to treat osteoporosis in women after menopause and in men. Delton See is used to treat a high calcium level due to cancer and to prevent bone fractures and other bone problems caused by multiple myeloma or cancer bone metastases. Delton See is also used to treat giant cell tumor of the bone. This medicine may be used for other purposes; ask your health care provider or pharmacist if you have questions. COMMON BRAND NAME(S): Prolia, XGEVA What should I tell my health care provider before I take this medicine? They need to know if you have any of these conditions: -dental disease -having surgery or tooth extraction -infection -kidney disease -low levels of calcium or Vitamin D in the blood -malnutrition -on hemodialysis -skin conditions or sensitivity -thyroid or parathyroid disease -an unusual reaction to denosumab, other medicines, foods, dyes, or preservatives -pregnant or trying to get pregnant -breast-feeding How should I use this medicine? This medicine is for injection under the skin. It is given by a health care professional in a hospital or clinic setting. If you are getting Prolia, a special MedGuide will be given to you by the pharmacist with each prescription and refill. Be sure to read this information carefully each time. For Prolia, talk to your pediatrician regarding the use of this medicine in children. Special care may be needed. For Delton See, talk to your pediatrician regarding the use of this medicine in children. While this drug may be prescribed for children as young as 13 years for selected conditions, precautions do apply. Overdosage: If you think you have taken too much of this medicine contact a poison control center or emergency room at once. NOTE: This medicine is only for you. Do not share this medicine with others. What if I miss a dose? It is important not to miss your  dose. Call your doctor or health care professional if you are unable to keep an appointment. What may interact with this medicine? Do not take this medicine with any of the following medications: -other medicines containing denosumab This medicine may also interact with the following medications: -medicines that lower your chance of fighting infection -steroid medicines like prednisone or cortisone This list may not describe all possible interactions. Give your health care provider a list of all the medicines, herbs, non-prescription drugs, or dietary supplements you use. Also tell them if you smoke, drink alcohol, or use illegal drugs. Some items may interact with your medicine. What should I watch for while using this medicine? Visit your doctor or health care professional for regular checks on your progress. Your doctor or health care professional may order blood tests and other tests to see how you are doing. Call your doctor or health care professional for advice if you get a fever, chills or sore throat, or other symptoms of a cold or flu. Do not treat yourself. This drug may decrease your body's ability to fight infection. Try to avoid being around people who are sick. You should make sure you get enough calcium and vitamin D while you are taking this medicine, unless your doctor tells you not to. Discuss the foods you eat and the vitamins you take with your health care professional. See your dentist regularly. Brush and floss your teeth as directed. Before you have any dental work done, tell your dentist you are receiving this medicine. Do not become pregnant while taking this medicine or for 5 months after stopping  it. Talk with your doctor or health care professional about your birth control options while taking this medicine. Women should inform their doctor if they wish to become pregnant or think they might be pregnant. There is a potential for serious side effects to an unborn child. Talk  to your health care professional or pharmacist for more information. What side effects may I notice from receiving this medicine? Side effects that you should report to your doctor or health care professional as soon as possible: -allergic reactions like skin rash, itching or hives, swelling of the face, lips, or tongue -bone pain -breathing problems -dizziness -jaw pain, especially after dental work -redness, blistering, peeling of the skin -signs and symptoms of infection like fever or chills; cough; sore throat; pain or trouble passing urine -signs of low calcium like fast heartbeat, muscle cramps or muscle pain; pain, tingling, numbness in the hands or feet; seizures -unusual bleeding or bruising -unusually weak or tired Side effects that usually do not require medical attention (report to your doctor or health care professional if they continue or are bothersome): -constipation -diarrhea -headache -joint pain -loss of appetite -muscle pain -runny nose -tiredness -upset stomach This list may not describe all possible side effects. Call your doctor for medical advice about side effects. You may report side effects to FDA at 1-800-FDA-1088. Where should I keep my medicine? This medicine is only given in a clinic, doctor's office, or other health care setting and will not be stored at home. NOTE: This sheet is a summary. It may not cover all possible information. If you have questions about this medicine, talk to your doctor, pharmacist, or health care provider.  2018 [employer] (2016-05-19 19:17:21)   Fulvestrant injection What is this medicine? FULVESTRANT (ful VES trant) blocks the effects of estrogen. It is used to treat breast cancer. This medicine may be used for other purposes; ask your health care provider or pharmacist if you have questions. COMMON BRAND NAME(S): FASLODEX What should I tell my health care provider before I take this medicine? They need to  know if you have any of these conditions: -bleeding problems -liver disease -low levels of platelets in the blood -an unusual or allergic reaction to fulvestrant, other medicines, foods, dyes, or preservatives -pregnant or trying to get pregnant -breast-feeding How should I use this medicine? This medicine is for injection into a muscle. It is usually given by a health care professional in a hospital or clinic setting. Talk to your pediatrician regarding the use of this medicine in children. Special care may be needed. Overdosage: If you think you have taken too much of this medicine contact a poison control center or emergency room at once. NOTE: This medicine is only for you. Do not share this medicine with others. What if I miss a dose? It is important not to miss your dose. Call your doctor or health care professional if you are unable to keep an appointment. What may interact with this medicine? -medicines that treat or prevent blood clots like warfarin, enoxaparin, and dalteparin This list may not describe all possible interactions. Give your health care provider a list of all the medicines, herbs, non-prescription drugs, or dietary supplements you use. Also tell them if you smoke, drink alcohol, or use illegal drugs. Some items may interact with your medicine. What should I watch for while using this medicine? Your condition will be monitored carefully while you are receiving this medicine. You will need important blood work done while you   taking this medicine. Do not become pregnant while taking this medicine or for at least 1 year after stopping it. Women of child-bearing potential will need to have a negative pregnancy test before starting this medicine. Women should inform their doctor if they wish to become pregnant or think they might be pregnant. There is a potential for serious side effects to an unborn child. Men should inform their doctors if they wish to father a child. This  medicine may lower sperm counts. Talk to your health care professional or pharmacist for more information. Do not breast-feed an infant while taking this medicine or for 1 year after the last dose. What side effects may I notice from receiving this medicine? Side effects that you should report to your doctor or health care professional as soon as possible: -allergic reactions like skin rash, itching or hives, swelling of the face, lips, or tongue -feeling faint or lightheaded, falls -pain, tingling, numbness, or weakness in the legs -signs and symptoms of infection like fever or chills; cough; flu-like symptoms; sore throat -vaginal bleeding Side effects that usually do not require medical attention (report to your doctor or health care professional if they continue or are bothersome): -aches, pains -constipation -diarrhea -headache -hot flashes -nausea, vomiting -pain at site where injected -stomach pain This list may not describe all possible side effects. Call your doctor for medical advice about side effects. You may report side effects to FDA at 1-800-FDA-1088. Where should I keep my medicine? This drug is given in a hospital or clinic and will not be stored at home. NOTE: This sheet is a summary. It may not cover all possible information. If you have questions about this medicine, talk to your doctor, pharmacist, or health care provider.  2018 [employer] Standard (2014-11-23 11:03:55)

## 2017-09-14 NOTE — Progress Notes (Signed)
[place]  [phone] [phone]     ID: Debbie [name] DOB: 25-May-1965  MR#: [id number]  PNT#:[id number]  Patient Care Team: Kelton Pillar, MD as PCP - General (Family Medicine) Jacelyn Pi, MD as Consulting Physician (Endocrinology) Leo Grosser Seymour Bars, MD as Consulting Physician (Obstetrics and Gynecology) Jovita Kussmaul, MD as Consulting Physician (General Surgery) [name], Virgie Dad, MD as Consulting Physician (Oncology) [name], MD as Attending Physician (Radiation Oncology) Belva Crome, MD as Consulting Physician (Cardiology) Crissie Reese, MD as Consulting Physician (Plastic Surgery) [name], [name], NP as Nurse Practitioner (Hematology and Oncology) PCP: Kelton Pillar, MD OTHER MD:  CHIEF COMPLAINT: Estrogen receptor positive stage IV  breast cancer  CURRENT TREATMENT:  Fulvestrant, palbociclib, denosumab/Xgeva   BREAST CANCER HISTORY:  from the original intake note:  Debbie [name] had screening mammography with tomosynthesis at the [facility] 10/02/2015. This shows 2 possible masses in the right breast as well as some suspicious calcifications. The left breast was unremarkable. On 10/15/2015 she underwent right diagnostic mammography with tomography and right breast ultrasonography. The breast density was category B. Spot magnification views confirmed an area of pleomorphic calcifications measuring up to 5 cm in the upper half of the breast. Also in the upper outer right breast there were adjacent low-density masses without distortion or calcifications. On physical exam there was no palpable abnormality.  Targeted ultrasound of the right breast found the 2 "masses" in the right upper quadrant were benign cysts measuring 0.8 and 0.5 cm respectively. Biopsy of the area of calcification on 10/15/2015 showed ([id number]) invasive ductal carcinoma, E-cadherin positive, grade 2, estrogen receptor 90% positive,  progesterone receptor 50% positive, both with strong staining intensity, with an MIB-1 of 70%, and no HER-2 amplification, the signals ratio being 1.18 and the number per cell 2.60.  Her subsequent history is as detailed below    INTERVAL HISTORY: Debbie [name] returns today for follow-up of her estrogen receptor positive stage IV breast cancer. She is doing well overall.   Debbie [name] is here to receive her first dose of Xgeva, and her Fulvestrant.  She also started Palbociclib, 172m, 3 weeks on, one week off.    REVIEW OF SYSTEMS: NMyishiais doing well today.  She notes some soreness at her injection site from the Fulvestrant, but otherwise tolerates the injections and Palbociclib well.  She is due for xgeva today.  She has received clearance to proceed with XDelton See by her dentist, Dr. JVernard Gambles  She says that she did undergo extraction on 08/10/17 and her dentist has evaluated her and she has completely healed.  She even tells me that her dentist called up here and spoke with Dr. MJana Hakimto give her clearance.  NElizetis doing well otherwise, she denies any issues today with fevers, chills, chest pain, palpitations, shortness of breath, abdominal concerns.  A detailed ROS was otherwise non contributory.         PAST MEDICAL HISTORY: Past Medical History:  Diagnosis Date  . Anxiety   . [place] metastasis [place]   . Breast cancer of upper-inner quadrant of right female breast ([facility] oncologist-  dr mJana Hakim-- 04/ 2019 ER/PR+ Stage IV w/ metastatic disease to liver, total spine, lung, bone   dx 06/ 2017  right breast invasive ductal carcinoma, Stage IIB, ER/PR + (pT2 pN1), grade 1-- s/p  right breast lumpecotmy w/ sln bx's and bilateral breast reduction 11-28-2015/  left breast with reduction , dx invasive ductal ca, Grade 1 (pT1cpNX) ER/PR positive/  adjuvant radiation completed 03-27-2016 to right chest wall, 2018  reclassifiec Stage IIA  . Cancer, metastatic to liver ([place])   . Chronic back pain    . Depression   . Family history of adverse reaction to anesthesia     mother has Copd was kept on vent   . Headache   . Heart murmur   . History of hyperthyroidism    s/p  RAI  . History of radiation therapy 02/11/16- 03/27/16   Right Chest Wall 50.4 Gy in 28 fractions, Right Chest Wall Boost 10 Gy in 5 fractions.   . Hyperlipidemia   . Hypertension   . Hypothyroidism, postradioiodine therapy    endocrinoloigst-  dr balan--  2004  s/p  RAI  . Menorrhagia 2011  . Multiple pulmonary nodules    secondary  metastatic  . PONV (postoperative nausea and vomiting)   . SUI (stress urinary incontinence, female)   . Wears contact lenses   . Wears dentures    full upper and lower partial    PAST SURGICAL HISTORY: Past Surgical History:  Procedure Laterality Date  . BREAST LUMPECTOMY WITH NEEDLE LOCALIZATION AND AXILLARY SENTINEL LYMPH NODE BX Right 11/28/2015   Procedure: RIGHT BREAST LUMPECTOMY WITH NEEDLE LOCALIZATION AND AXILLARY SENTINEL LYMPH NODE BX;  Surgeon: Autumn Messing III, MD;  Location: [place];  Service: General;  Laterality: Right;  . BREAST REDUCTION SURGERY Bilateral 11/28/2015   Procedure: MAMMARY REDUCTION  (BREAST) BILATERAL;  Surgeon: Crissie Reese, MD;  Location: [place];  Service: Plastics;  Laterality: Bilateral;  . CESAREAN SECTION  01/12/1993  . COLONOSCOPY    . HYSTEROSCOPY N/A 09/02/2017   Procedure: HYSTEROSCOPY  to remove IUD;  Surgeon: Eldred Manges, MD;  Location: [facility];  Service: Gynecology;  Laterality: N/A;  . MASTECTOMY W/ SENTINEL NODE BIOPSY Bilateral 12/26/2015   Procedure: BILATERAL MASTECTOMY WITH LEFT SENTINEL LYMPH NODE BIOPSY;  Surgeon: Autumn Messing III, MD;  Location: [place];  Service: General;  Laterality: Bilateral;  . TUBAL LIGATION Bilateral 1995    FAMILY HISTORY Family History  Problem Relation Age of Onset  . Diabetes Mother   . Hypertension Mother   . Hypertension Father   . Diabetes Maternal Grandmother   . Hypertension  Sister   . Cancer Paternal Grandmother        colon  . Colon cancer Paternal Grandmother   . Esophageal cancer Neg Hx   . Stomach cancer Neg Hx   . Rectal cancer Neg Hx   The patient's parents are still living, in their early 30s as of June 2017. The patient has one brother, 2 sisters. On the maternal side there is a history of uterine and prostate cancer. On the paternal side there is a history of colon cancer and possibly uterine cancer. There is no history of breast or ovarian cancer in the family.   GYNECOLOGIC HISTORY:  No LMP recorded. (Menstrual status: IUD).  Menarche age 77. She still having regular periods. She is GX P1, first pregnancy age 55. She used oral contraceptives for a few years remotely, with no complications. She is currently on a Mirena IUD and also is status post bilateral tubal ligation.  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 Her husband Debbie [name] is a Administrator. Their son Debbie [name] is a radio DJ. The patient has no grandchildren. She is not a Ambulance person.    ADVANCED DIRECTIVES: Not in place   HEALTH MAINTENANCE: Social History  Tobacco Use  . Smoking status: Never Smoker  . Smokeless tobacco: Never Used  Substance Use Topics  . Alcohol use: No  . Drug use: No     Colonoscopy: January 2016   PAP: January 2016   Bone density: August 2017 showed a T score of +0.7 normal  Lipid panel:  Allergies  Allergen Reactions  . Ace Inhibitors Cough  . Atorvastatin Other (See Comments)    Joint pain  . Simvastatin Other (See Comments)    Joint pain    Current Outpatient Medications  Medication Sig Dispense Refill  . acetaminophen (TYLENOL) 325 MG tablet Take 650 mg by mouth every 6 (six) hours as needed for mild pain. Reported on 10/23/2015    . Calcium-Magnesium 250-125 MG TABS Take 1 tablet by mouth daily. 120 each 4  . Cetirizine HCl (ZYRTEC ALLERGY PO) Take 1 tablet by mouth daily as needed (allergies).     .  hydrochlorothiazide (MICROZIDE) 12.5 MG capsule Take 12.5 mg by mouth every morning.     Marland Kitchen ibuprofen (ADVIL,MOTRIN) 600 MG tablet Take 1 tablet (600 mg total) by mouth every 6 (six) hours as needed for moderate pain. 60 tablet 0  . levonorgestrel (MIRENA) 20 MCG/24HR IUD 1 each by Intrauterine route once.    Marland Kitchen levothyroxine (SYNTHROID, LEVOTHROID) 137 MCG tablet Take 137 mcg by mouth daily before breakfast.    . LIVALO 2 MG TABS Take 0.2 mg by mouth daily.   6  . losartan (COZAAR) 100 MG tablet Take 100 mg by mouth every morning.     . metFORMIN (GLUCOPHAGE-XR) 750 MG 24 hr tablet Take 750 mg by mouth every evening.   6  . Multiple Vitamin (MULTIVITAMIN) tablet Take 1 tablet by mouth daily.    Marland Kitchen omega-3 acid ethyl esters (LOVAZA) 1 g capsule Take 1 g by mouth 2 (two) times daily.     . palbociclib (IBRANCE) 125 MG capsule Take 1 capsule (125 mg total) by mouth daily with lunch. Take for 21 days on, 7 days off, repeat every 28 days. Take whole with food. 21 capsule 6  . potassium chloride SA (K-DUR,KLOR-CON) 20 MEQ tablet Take 40 mEq by mouth 2 (two) times daily.     . traMADol (ULTRAM) 50 MG tablet Take 1 tablet (50 mg total) by mouth every 6 (six) hours as needed. 60 tablet 3  . Vitamin D, Cholecalciferol, 1000 units CAPS Take 1 tablet by mouth daily. 90 capsule 4   No current facility-administered medications for this visit.     OBJECTIVE:  Vitals:   09/14/17 0912  BP: (!) 151/78  Pulse: 92  Resp: 18  Temp: 98.4 F (36.9 C)  SpO2: 100%     Body mass index is 41.51 kg/m.    ECOG FS:1 - Symptomatic but completely ambulatory GENERAL: Patient is a well appearing female in no acute distress HEENT:  Sclerae anicteric.  Oropharynx clear and moist. No ulcerations or evidence of oropharyngeal candidiasis. Neck is supple.  NODES:  No cervical, supraclavicular, or axillary lymphadenopathy palpated.  BREAST EXAM:  Deferred. LUNGS:  Clear to auscultation bilaterally.  No wheezes or  rhonchi. HEART:  Regular rate and rhythm. No murmur appreciated. ABDOMEN:  Soft, nontender.  Positive, normoactive bowel sounds. No organomegaly palpated. MSK:  No focal spinal tenderness to palpation. Full range of motion bilaterally in the upper extremities. EXTREMITIES:  No peripheral edema.   SKIN:  Clear with no obvious rashes or skin changes. No nail dyscrasia. NEURO:  Nonfocal.  Well oriented.  Appropriate affect.    LAB RESULTS:  CMP     Component Value Date/Time   NA 142 09/07/2017 1140   NA 140 07/08/2016 0948   K 3.9 09/07/2017 1140   K 4.1 07/08/2016 0948   CL 107 09/07/2017 1140   CO2 24 09/07/2017 1140   CO2 24 07/08/2016 0948   GLUCOSE 87 09/07/2017 1140   GLUCOSE 89 07/08/2016 0948   BUN 12 09/07/2017 1140   BUN 11.9 07/08/2016 0948   CREATININE 0.99 09/07/2017 1140   CREATININE 0.90 06/22/2017 1147   CREATININE 0.9 07/08/2016 0948   CALCIUM 9.6 09/07/2017 1140   CALCIUM 10.0 07/08/2016 0948   PROT 7.9 09/07/2017 1140   PROT 7.6 07/08/2016 0948   ALBUMIN 3.8 09/07/2017 1140   ALBUMIN 3.6 07/08/2016 0948   AST 31 09/07/2017 1140   AST 31 06/22/2017 1147   AST 22 07/08/2016 0948   ALT 45 09/07/2017 1140   ALT 45 06/22/2017 1147   ALT 27 07/08/2016 0948   ALKPHOS 128 (H) 09/07/2017 1140   ALKPHOS 115 07/08/2016 0948   BILITOT 0.6 09/07/2017 1140   BILITOT 0.4 06/22/2017 1147   BILITOT 0.35 07/08/2016 0948   GFRNONAA >60 09/07/2017 1140   GFRNONAA >60 06/22/2017 1147   GFRAA >60 09/07/2017 1140   GFRAA >60 06/22/2017 1147    INo results found for: SPEP, UPEP  Lab Results  Component Value Date   WBC 2.5 (L) 09/14/2017   NEUTROABS 1.5 09/14/2017   HGB 12.4 09/14/2017   HCT 37.9 09/14/2017   MCV 95.2 09/14/2017   PLT 225 09/14/2017      Chemistry      Component Value Date/Time   NA 142 09/07/2017 1140   NA 140 07/08/2016 0948   K 3.9 09/07/2017 1140   K 4.1 07/08/2016 0948   CL 107 09/07/2017 1140   CO2 24 09/07/2017 1140   CO2 24  07/08/2016 0948   BUN 12 09/07/2017 1140   BUN 11.9 07/08/2016 0948   CREATININE 0.99 09/07/2017 1140   CREATININE 0.90 06/22/2017 1147   CREATININE 0.9 07/08/2016 0948      Component Value Date/Time   CALCIUM 9.6 09/07/2017 1140   CALCIUM 10.0 07/08/2016 0948   ALKPHOS 128 (H) 09/07/2017 1140   ALKPHOS 115 07/08/2016 0948   AST 31 09/07/2017 1140   AST 31 06/22/2017 1147   AST 22 07/08/2016 0948   ALT 45 09/07/2017 1140   ALT 45 06/22/2017 1147   ALT 27 07/08/2016 0948   BILITOT 0.6 09/07/2017 1140   BILITOT 0.4 06/22/2017 1147   BILITOT 0.35 07/08/2016 0948       No results found for: LABCA2  No components found for: LABCA125  Recent Labs  Lab 09/07/17 1140  INR 0.88    Urinalysis No results found for: COLORURINE, APPEARANCEUR, LABSPEC, PHURINE, GLUCOSEU, HGBUR, BILIRUBINUR, KETONESUR, PROTEINUR, UROBILINOGEN, NITRITE, LEUKOCYTESUR   STUDIES: Ct Head W Wo Contrast  Result Date: 08/26/2017 CLINICAL DATA:  Metastatic breast cancer. Blurry vision and vertigo. EXAM: CT HEAD WITHOUT AND WITH CONTRAST TECHNIQUE: Contiguous axial images were obtained from the base of the skull through the vertex without and with intravenous contrast CONTRAST:  51m ISOVUE-300 IOPAMIDOL (ISOVUE-300) INJECTION 61% COMPARISON:  None. FINDINGS: BRAIN: No intraparenchymal hemorrhage, mass effect nor midline shift. The ventricles and sulci are normal. No acute large vascular territory infarcts. No abnormal extra-axial fluid collections. Basal cisterns are patent. VASCULAR: Trace calcific atherosclerosis carotid siphon. SKULL/SOFT TISSUES: No skull fracture.  No significant soft tissue swelling. Empty J-shaped sella. ORBITS/SINUSES: The included ocular globes and orbital contents are normal.The mastoid aircells and included paranasal sinuses are well-aerated. OTHER: None. IMPRESSION: 1. No acute intracranial process. 2. Empty sella, otherwise unremarkable noncontrast CT HEAD for age. Electronically  Signed   By: Elon Alas M.D.   On: 08/26/2017 21:32   US Biopsy (liver)  Result Date: 09/07/2017 INDICATION: 52 year old with history of breast cancer and several suspicious liver lesions. Request for liver lesion biopsy. EXAM: ULTRASOUND-GUIDED LIVER LESION BIOPSY MEDICATIONS: None. ANESTHESIA/SEDATION: Moderate (conscious) sedation was employed during this procedure. A total of Versed 2.0 mg and Fentanyl 100 mcg was administered intravenously. Moderate Sedation Time: 25 minutes. The patient's level of consciousness and vital signs were monitored continuously by radiology nursing throughout the procedure under my direct supervision. FLUOROSCOPY TIME:  None COMPLICATIONS: None immediate. PROCEDURE: Informed written consent was obtained from the patient after a thorough discussion of the procedural risks, benefits and alternatives. All questions were addressed. A timeout was performed prior to the initiation of the procedure. Liver was evaluated with ultrasound. Procedure was technically very difficult due to hepatic steatosis. Right side of the abdomen was prepped with chlorhexidine and sterile field was created. Skin and soft tissues were anesthetized with 1% lidocaine. 29 gauge coaxial needle was directed into the liver with ultrasound guidance. Small hypoechoic lesion was targeted. A total of 6 core biopsies were obtained with an 18 gauge device. Specimens placed in formalin. Gel-Foam slurry was injected through the 17 gauge needle as it was removed. Bandage placed over the puncture site. FINDINGS: Liver is extremely echogenic and poor visualization of the internal architecture. Findings compatible with severe hepatic steatosis. Several small hypoechoic lesions are scattered throughout the liver. Small lesion in the right hepatic lobe was felt to be accessible for biopsy. 17 gauge needle was directed towards the lesion. It was difficult to even identify the needle within the liver due to the steatosis.  In addition, the procedure was technically difficult due to excessive abdominal breathing by the patient. No significant bleeding or hematoma formation following the core biopsies. IMPRESSION: Ultrasound-guided core biopsies of a right hepatic lesion. Procedure was technically difficult due to severe hepatic steatosis. Electronically Signed   By: Markus Daft M.D.   On: 09/07/2017 17:12    ELIGIBLE FOR AVAILABLE RESEARCH PROTOCOL: PALLAS, but inelegible because of Mirena IUD  ASSESSMENT: 52 y.o. [place] woman status post right breast upper inner quadrant biopsy 10/15/2015 for a clinical T2-T3 N0, stage II invasive ductal carcinoma, estrogen and progesterone receptor positive, HER-2 nonamplified, with an Mib-1 of 70%  (1) status post right lumpectomy and sentinel lymph node sampling with oncoplastic breast reduction 11/28/2015 for a pT2 pN1, stage IIB invasive ductal carcinoma, grade 1, with negative margins (1/1 sentinel node positive)  (a) reclassified as stage IIA in the 2018 prognostic revision   (2) left reduction mammoplasty 11/28/2015 unexpectedly found a pT1c pNX invasive ductal carcinoma, grade 1, estrogen and progesterone receptor positive, HER-2 not amplified, with an MIB-1 of 3%  (3) Mammaprint from the right sided breast cancer was "low risk", predicting a 98% chance of no disease recurrence within 5 years with anti-estrogens as the only systemic therapy. It also predicts minimal to no benefit from chemotherapy  (4) status post bilateral mastectomies with bilateral sentinel lymph node sampling 12/26/2015, showing  (a) on the right, no residual carcinoma (0/3 nodes involved)  (b) on the left, focal ductal carcinoma in situ, 0.2 cm, with negative margins; (  0/4 nodes involved)  (5) adjuvant radiation 02/11/16 - 03/27/16    1) Right chest wall: 50.4 Gy in 28 fractions               2) Right chest wall boost: 10 Gy in 5 fractions   (6) started on tamoxifen neoadjuvantly 10/23/2015 to  allow for expected delays in definitive local treatment  (a) Mirena IUD in place  (b) tamoxifen discontinued 08/13/2017, with progression  (7) since she had 4 lymph nodes removed from each axilla but also receive radiation on the right, lab draws should preferentially be obtained from the left arm   METASTATIC DISEASE: April 2019 (8) CT scan of the abdomen and pelvis 08/04/2017, MRI of the liver and total spine MRI 08/11/2017 and CT scan of the chest and brain on 08/13/2017 showed liver and bone metastases  (a) liver biopsy pending  (b) chest CT scan 08/13/2017 shows multiple bilateral pulmonary nodules.  (c) CT of the brain with and without contrast 08/26/2017 shows no evidence of metastatic disease to the brain  (9) fulvestrant started 08/17/2017  (a) palbociclib to start 08/31/2017  (10) denosumab/Xgeva to start 09/14/2017  PLAN:  Debbie [name] is doing well today.  Her labs are stable today.  I reviewed her WBC with her and her [place] with her.  She can continue Palbociclib, 3 weeks on and one week off.  She will proceed with her injections today.  She is tolerating her treatment well thus far.  She will have a lab appointment on 5/21 and will f/u with Dr. [name] on 10/12/2017.    Debbie [name] knows to call for any questions or concerns prior to her next appointment with [place].    A total of (30) minutes of face-to-face time was spent with this patient with greater than 50% of that time in counseling and care-coordination.    Wilber Bihari, NP  09/14/17 9:23 AM Medical Oncology and Hematology [facility] [address] [place], [place] [address] Tel. [phone]    Fax. [phone]

## 2017-09-15 ENCOUNTER — Other Ambulatory Visit: Payer: Self-pay | Admitting: Oncology

## 2017-09-15 LAB — CANCER ANTIGEN 27.29: CA 27.29: 378.3 U/mL — ABNORMAL HIGH (ref 0.0–38.6)

## 2017-09-21 MED FILL — IBRANCE 125 MG CAPSULE: 125 | 28 days supply | Qty: 21 | Fill #1

## 2017-09-22 ENCOUNTER — Encounter: Payer: Self-pay | Admitting: *Deleted

## 2017-09-28 ENCOUNTER — Inpatient Hospital Stay: Payer: 59

## 2017-09-28 DIAGNOSIS — C787 Secondary malignant neoplasm of liver and intrahepatic bile duct: Secondary | ICD-10-CM

## 2017-09-28 DIAGNOSIS — C7951 Secondary malignant neoplasm of bone: Secondary | ICD-10-CM

## 2017-09-28 DIAGNOSIS — Z170 Estrogen receptor positive status [ER+]: Secondary | ICD-10-CM

## 2017-09-28 DIAGNOSIS — C50211 Malignant neoplasm of upper-inner quadrant of right female breast: Secondary | ICD-10-CM | POA: Diagnosis not present

## 2017-09-28 DIAGNOSIS — C50012 Malignant neoplasm of nipple and areola, left female breast: Secondary | ICD-10-CM

## 2017-09-28 DIAGNOSIS — Z79818 Long term (current) use of other agents affecting estrogen receptors and estrogen levels: Secondary | ICD-10-CM | POA: Diagnosis not present

## 2017-09-28 DIAGNOSIS — Z923 Personal history of irradiation: Secondary | ICD-10-CM | POA: Diagnosis not present

## 2017-09-28 DIAGNOSIS — C50011 Malignant neoplasm of nipple and areola, right female breast: Secondary | ICD-10-CM

## 2017-09-28 DIAGNOSIS — C50112 Malignant neoplasm of central portion of left female breast: Secondary | ICD-10-CM

## 2017-09-28 LAB — CBC WITH DIFFERENTIAL/PLATELET
Basophils Absolute: 0.1 10*3/uL (ref 0.0–0.1)
Basophils Relative: 2 %
Eosinophils Absolute: 0.1 10*3/uL (ref 0.0–0.5)
Eosinophils Relative: 2 %
HCT: 38.1 % (ref 34.8–46.6)
Hemoglobin: 12.6 g/dL (ref 11.6–15.9)
Lymphocytes Relative: 36 %
Lymphs Abs: 1.1 10*3/uL (ref 0.9–3.3)
MCH: 31.7 pg (ref 25.1–34.0)
MCHC: 33.1 g/dL (ref 31.5–36.0)
MCV: 96 fL (ref 79.5–101.0)
Monocytes Absolute: 0.3 10*3/uL (ref 0.1–0.9)
Monocytes Relative: 11 %
Neutro Abs: 1.5 10*3/uL (ref 1.5–6.5)
Neutrophils Relative %: 49 %
Platelets: 245 10*3/uL (ref 145–400)
RBC: 3.97 MIL/uL (ref 3.70–5.45)
RDW: 15.5 % — ABNORMAL HIGH (ref 11.2–14.5)
WBC: 3 10*3/uL — ABNORMAL LOW (ref 3.9–10.3)

## 2017-09-28 LAB — COMPREHENSIVE METABOLIC PANEL
ALT: 49 U/L (ref 0–55)
AST: 35 U/L — ABNORMAL HIGH (ref 5–34)
Albumin: 3.7 g/dL (ref 3.5–5.0)
Alkaline Phosphatase: 163 U/L — ABNORMAL HIGH (ref 40–150)
Anion gap: 8 (ref 3–11)
BUN: 8 mg/dL (ref 7–26)
CO2: 26 mmol/L (ref 22–29)
Calcium: 9.1 mg/dL (ref 8.4–10.4)
Chloride: 107 mmol/L (ref 98–109)
Creatinine, Ser: 0.79 mg/dL (ref 0.60–1.10)
GFR calc Af Amer: >60 mL/min (ref >60)
GFR calc non Af Amer: >60 mL/min (ref >60)
Glucose, Bld: 94 mg/dL (ref 70–140)
Potassium: 4.3 mmol/L (ref 3.5–5.1)
Sodium: 141 mmol/L (ref 136–145)
Total Bilirubin: 0.3 mg/dL (ref 0.2–1.2)
Total Protein: 7.5 g/dL (ref 6.4–8.3)

## 2017-09-29 LAB — FOLLICLE STIMULATING HORMONE: FSH: 60.1 m[IU]/mL

## 2017-09-30 LAB — ESTRADIOL, ULTRA SENS: Estradiol, Sensitive: 43.3 pg/mL

## 2017-10-06 MED FILL — LOSARTAN POTASSIUM 100 MG T: 100 | 90 days supply | Qty: 90 | Fill #1 | Status: TO

## 2017-10-08 NOTE — Progress Notes (Signed)
Debbie [name]  [phone] [phone]     ID: Debbie [name] DOB: [birth year]  MR#: [id number]  WUG#:[id number]  Patient Care Team: Kelton Pillar, MD as PCP - General (Family Medicine) Jacelyn Pi, MD as Consulting Physician (Endocrinology) Leo Grosser Seymour Bars, MD as Consulting Physician (Obstetrics and Gynecology) Jovita Kussmaul, MD as Consulting Physician (General Surgery) Chevi Lim, Virgie Dad, MD as Consulting Physician (Oncology) [name], MD as Attending Physician (Radiation Oncology) Belva Crome, MD as Consulting Physician (Cardiology) Crissie Reese, MD as Consulting Physician (Plastic Surgery) [name], [name], NP as Nurse Practitioner (Hematology and Oncology) PCP: Kelton Pillar, MD OTHER MD:  CHIEF COMPLAINT: Estrogen receptor positive stage IV  breast cancer  CURRENT TREATMENT:  Fulvestrant, palbociclib, denosumab/Xgeva  INTERVAL HISTORY: Debbie [name] returns today for follow-up of her estrogen receptor positive stage IV breast cancer. She continues on daily palbociclib, currently at 125 mg 3 weeks on and 1 week off. She is in the 2nd week and has tolerated this well. She feels more intense hot flashes since starting palbociclib. They do not occur everyday, but they are more frequent when she is stressed. She has nocturia and a strong urine odor, that she describes as a "hair chemical perm". She drinks more water.   She also receives fulvestrant every 4 weeks with a dose due today. She feels some burning and tenderness at the injection site. She also feels knots and tightness the 1st two days after the shots, but this resolves.   Finally, she receives denosumab/Xgeva every 4 weeks, with a dose also due today. She didn't have any pain. She chews tums as needed, and she eats more yogurt.   On 09/07/2017 she underwent biopsy of 1 of the liver lesions, showing (SZ B19-05/16/2001) metastatic carcinoma, estrogen receptor 100%  positive with moderate staining intensity, progesterone receptor negative, HER-2 not amplified, with an MIB-1 of 1.24 and the number per cell 1.80.  The perforation marker was less than 1%.  Note that the patient had already been started on fulvestrant prior to this biopsy.    REVIEW OF SYSTEMS: Nalany reports that she is working 60 hours a week. She hasn't been as sore this month and she tries to move more often. She has been standing more often at work. She has had some back pain due to this, but this isn't severe enough to take tylenol. She denies unusual headaches, visual changes, nausea, vomiting, or dizziness. There has been no unusual cough, phlegm production, or pleurisy. This been no change in bowel or bladder habits. She denies unexplained fatigue or unexplained weight loss, bleeding, rash, or fever. A detailed review of systems was otherwise stable.      PAST MEDICAL HISTORY: Past Medical History:  Diagnosis Date  . Anxiety   . Bone metastasis ([facility])   . Breast cancer of upper-inner quadrant of right female breast [facility]) oncologist-  dr [name]--- 04/ 2019 ER/PR+ Stage IV w/ metastatic disease to liver, total spine, lung, bone   dx 06/ 2017  right breast invasive ductal carcinoma, Stage IIB, ER/PR + (pT2 pN1), grade 1-- s/p  right breast lumpecotmy w/ sln bx's and bilateral breast reduction 11-28-2015/  left breast with reduction , dx invasive ductal ca, Grade 1 (pT1cpNX) ER/PR positive/  adjuvant radiation completed 03-27-2016 to right chest wall, 2018  reclassifiec Stage IIA  . Cancer, metastatic to liver ([place])   . Chronic back pain   . Depression   . Family history of adverse reaction to anesthesia  mother has Copd was kept on vent   . Headache   . Heart murmur   . History of hyperthyroidism    s/p  RAI  . History of radiation therapy 02/11/16- 03/27/16   Right Chest Wall 50.4 Gy in 28 fractions, Right Chest Wall Boost 10 Gy in 5 fractions.   . Hyperlipidemia   .  Hypertension   . Hypothyroidism, postradioiodine therapy    endocrinoloigst-  dr balan--  2004  s/p  RAI  . Menorrhagia 2011  . Multiple pulmonary nodules    secondary  metastatic  . PONV (postoperative nausea and vomiting)   . SUI (stress urinary incontinence, female)   . Wears contact lenses   . Wears dentures    full upper and lower partial    PAST SURGICAL HISTORY: Past Surgical History:  Procedure Laterality Date  . BREAST LUMPECTOMY WITH NEEDLE LOCALIZATION AND AXILLARY SENTINEL LYMPH NODE BX Right 11/28/2015   Procedure: RIGHT BREAST LUMPECTOMY WITH NEEDLE LOCALIZATION AND AXILLARY SENTINEL LYMPH NODE BX;  Surgeon: Autumn Messing III, MD;  Location: [facility];  Service: General;  Laterality: Right;  . BREAST REDUCTION SURGERY Bilateral 11/28/2015   Procedure: MAMMARY REDUCTION  (BREAST) BILATERAL;  Surgeon: Crissie Reese, MD;  Location: [place];  Service: Plastics;  Laterality: Bilateral;  . CESAREAN SECTION  01/12/1993  . COLONOSCOPY    . HYSTEROSCOPY N/A 09/02/2017   Procedure: HYSTEROSCOPY  to remove IUD;  Surgeon: Eldred Manges, MD;  Location: [facility];  Service: Gynecology;  Laterality: N/A;  . MASTECTOMY W/ SENTINEL NODE BIOPSY Bilateral 12/26/2015   Procedure: BILATERAL MASTECTOMY WITH LEFT SENTINEL LYMPH NODE BIOPSY;  Surgeon: Autumn Messing III, MD;  Location: [place];  Service: General;  Laterality: Bilateral;  . TUBAL LIGATION Bilateral 1995    FAMILY HISTORY Family History  Problem Relation Age of Onset  . Diabetes Mother   . Hypertension Mother   . Hypertension Father   . Diabetes Maternal Grandmother   . Hypertension Sister   . Cancer Paternal Grandmother        colon  . Colon cancer Paternal Grandmother   . Esophageal cancer Neg Hx   . Stomach cancer Neg Hx   . Rectal cancer Neg Hx   The patient's parents are still living, in their early 62s as of June 2017. The patient has one brother, 2 sisters. On the maternal side there is a history of uterine and  prostate cancer. On the paternal side there is a history of colon cancer and possibly uterine cancer. There is no history of breast or ovarian cancer in the family.   GYNECOLOGIC HISTORY:  No LMP recorded. (Menstrual status: IUD).  Menarche age 60. She still having regular periods. She is GX P1, first pregnancy age 58. She used oral contraceptives for a few years remotely, with no complications. She is currently on a Mirena IUD and also is status post bilateral tubal ligation.  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 Her husband [name] is a Administrator. Their son [name] is a radio DJ. The patient has no grandchildren. She is not a Ambulance person.    ADVANCED DIRECTIVES: Not in place   HEALTH MAINTENANCE: Social History   Tobacco Use  . Smoking status: Never Smoker  . Smokeless tobacco: Never Used  Substance Use Topics  . Alcohol use: No  . Drug use: No     Colonoscopy: January 2016   PAP: January 2016  Bone density: August 2017 showed a T score of +0.7 normal  Lipid panel:  Allergies  Allergen Reactions  . Ace Inhibitors Cough  . Atorvastatin Other (See Comments)    Joint pain  . Simvastatin Other (See Comments)    Joint pain    Current Outpatient Medications  Medication Sig Dispense Refill  . acetaminophen (TYLENOL) 325 MG tablet Take 650 mg by mouth every 6 (six) hours as needed for mild pain. Reported on 10/23/2015    . Calcium-Magnesium 250-125 MG TABS Take 1 tablet by mouth daily. 120 each 4  . Cetirizine HCl (ZYRTEC ALLERGY PO) Take 1 tablet by mouth daily as needed (allergies).     . hydrochlorothiazide (MICROZIDE) 12.5 MG capsule Take 12.5 mg by mouth every morning.     Marland Kitchen ibuprofen (ADVIL,MOTRIN) 600 MG tablet Take 1 tablet (600 mg total) by mouth every 6 (six) hours as needed for moderate pain. 60 tablet 0  . levonorgestrel (MIRENA) 20 MCG/24HR IUD 1 each by Intrauterine route once.    Marland Kitchen levothyroxine (SYNTHROID, LEVOTHROID) 137  MCG tablet Take 137 mcg by mouth daily before breakfast.    . LIVALO 2 MG TABS Take 0.2 mg by mouth daily.   6  . losartan (COZAAR) 100 MG tablet Take 100 mg by mouth every morning.     . metFORMIN (GLUCOPHAGE-XR) 750 MG 24 hr tablet Take 750 mg by mouth every evening.   6  . Multiple Vitamin (MULTIVITAMIN) tablet Take 1 tablet by mouth daily.    Marland Kitchen omega-3 acid ethyl esters (LOVAZA) 1 g capsule Take 1 g by mouth 2 (two) times daily.     . palbociclib (IBRANCE) 125 MG capsule Take 1 capsule (125 mg total) by mouth daily with lunch. Take for 21 days on, 7 days off, repeat every 28 days. Take whole with food. 21 capsule 6  . potassium chloride SA (K-DUR,KLOR-CON) 20 MEQ tablet Take 40 mEq by mouth 2 (two) times daily.     . traMADol (ULTRAM) 50 MG tablet Take 1 tablet (50 mg total) by mouth every 6 (six) hours as needed. 60 tablet 3  . Vitamin D, Cholecalciferol, 1000 units CAPS Take 1 tablet by mouth daily. 90 capsule 4   No current facility-administered medications for this visit.     OBJECTIVE: Middle-aged African-American woman who appears well  Vitals:   10/12/17 0820  BP: 128/74  Pulse: 88  Resp: 18  Temp: 98.4 F (36.9 C)  SpO2: 100%     Body mass index is 42.03 kg/m.    ECOG FS:1 - Symptomatic but completely ambulatory  Filed Weights   10/12/17 0820  Weight: 260 lb 6.4 oz (118.1 kg)   Sclerae unicteric, EOMs intact Oropharynx clear and moist No cervical or supraclavicular adenopathy Lungs no rales or rhonchi Heart regular rate and rhythm Abd soft, nontender, positive bowel sounds MSK no focal spinal tenderness, no upper extremity lymphedema Neuro: nonfocal, well oriented, appropriate affect Breasts: Status post bilateral mastectomies, with no evidence of local recurrence.  Both axillae are benign    LAB RESULTS:  CMP     Component Value Date/Time   NA 141 09/28/2017 1022   NA 140 07/08/2016 0948   K 4.3 09/28/2017 1022   K 4.1 07/08/2016 0948   CL 107  09/28/2017 1022   CO2 26 09/28/2017 1022   CO2 24 07/08/2016 0948   GLUCOSE 94 09/28/2017 1022   GLUCOSE 89 07/08/2016 0948   BUN 8 09/28/2017 1022   BUN  11.9 07/08/2016 0948   CREATININE 0.79 09/28/2017 1022   CREATININE 0.90 06/22/2017 1147   CREATININE 0.9 07/08/2016 0948   CALCIUM 9.1 09/28/2017 1022   CALCIUM 10.0 07/08/2016 0948   PROT 7.5 09/28/2017 1022   PROT 7.6 07/08/2016 0948   ALBUMIN 3.7 09/28/2017 1022   ALBUMIN 3.6 07/08/2016 0948   AST 35 (H) 09/28/2017 1022   AST 31 06/22/2017 1147   AST 22 07/08/2016 0948   ALT 49 09/28/2017 1022   ALT 45 06/22/2017 1147   ALT 27 07/08/2016 0948   ALKPHOS 163 (H) 09/28/2017 1022   ALKPHOS 115 07/08/2016 0948   BILITOT 0.3 09/28/2017 1022   BILITOT 0.4 06/22/2017 1147   BILITOT 0.35 07/08/2016 0948   GFRNONAA >60 09/28/2017 1022   GFRNONAA >60 06/22/2017 1147   GFRAA >60 09/28/2017 1022   GFRAA >60 06/22/2017 1147    INo results found for: SPEP, UPEP  Lab Results  Component Value Date   WBC 3.0 (L) 09/28/2017   NEUTROABS 1.5 09/28/2017   HGB 12.6 09/28/2017   HCT 38.1 09/28/2017   MCV 96.0 09/28/2017   PLT 245 09/28/2017      Chemistry      Component Value Date/Time   NA 141 09/28/2017 1022   NA 140 07/08/2016 0948   K 4.3 09/28/2017 1022   K 4.1 07/08/2016 0948   CL 107 09/28/2017 1022   CO2 26 09/28/2017 1022   CO2 24 07/08/2016 0948   BUN 8 09/28/2017 1022   BUN 11.9 07/08/2016 0948   CREATININE 0.79 09/28/2017 1022   CREATININE 0.90 06/22/2017 1147   CREATININE 0.9 07/08/2016 0948      Component Value Date/Time   CALCIUM 9.1 09/28/2017 1022   CALCIUM 10.0 07/08/2016 0948   ALKPHOS 163 (H) 09/28/2017 1022   ALKPHOS 115 07/08/2016 0948   AST 35 (H) 09/28/2017 1022   AST 31 06/22/2017 1147   AST 22 07/08/2016 0948   ALT 49 09/28/2017 1022   ALT 45 06/22/2017 1147   ALT 27 07/08/2016 0948   BILITOT 0.3 09/28/2017 1022   BILITOT 0.4 06/22/2017 1147   BILITOT 0.35 07/08/2016 0948       No  results found for: LABCA2  No components found for: LABCA125  No results for input(s): INR in the last 168 hours.  Urinalysis No results found for: COLORURINE, APPEARANCEUR, LABSPEC, PHURINE, GLUCOSEU, HGBUR, BILIRUBINUR, KETONESUR, PROTEINUR, UROBILINOGEN, NITRITE, LEUKOCYTESUR   STUDIES: No results found.  ELIGIBLE FOR AVAILABLE RESEARCH PROTOCOL: PALLAS, but inelegible because of Mirena IUD  ASSESSMENT: 52 y.o. [place] woman status post right breast upper inner quadrant biopsy 10/15/2015 for a clinical T2-T3 N0, stage II invasive ductal carcinoma, estrogen and progesterone receptor positive, HER-2 nonamplified, with an Mib-1 of 70%  (1) status post right lumpectomy and sentinel lymph node sampling with oncoplastic breast reduction 11/28/2015 for a pT2 pN1, stage IIB invasive ductal carcinoma, grade 1, with negative margins (1/1 sentinel node positive)  (a) reclassified as stage IIA in the 2018 prognostic revision   (2) left reduction mammoplasty 11/28/2015 unexpectedly found a pT1c pNX invasive ductal carcinoma, grade 1, estrogen and progesterone receptor positive, HER-2 not amplified, with an MIB-1 of 3%  (3) Mammaprint from the right sided breast cancer was "low risk", predicting a 98% chance of no disease recurrence within 5 years with anti-estrogens as the only systemic therapy. It also predicts minimal to no benefit from chemotherapy  (4) status post bilateral mastectomies with bilateral sentinel lymph node sampling 12/26/2015,  showing  (a) on the right, no residual carcinoma (0/3 nodes involved)  (b) on the left, focal ductal carcinoma in situ, 0.2 cm, with negative margins; (0/4 nodes involved)  (5) adjuvant radiation 02/11/16 - 03/27/16    1) Right chest wall: 50.4 Gy in 28 fractions               2) Right chest wall boost: 10 Gy in 5 fractions   (6) started on tamoxifen neoadjuvantly 10/23/2015 to allow for expected delays in definitive local treatment  (a) Mirena IUD  in place  (b) tamoxifen discontinued 08/13/2017, with progression  (7) since she had 4 lymph nodes removed from each axilla but also receive radiation on the right, lab draws should preferentially be obtained from the left arm   METASTATIC DISEASE: April 2019 (8) CT scan of the abdomen and pelvis 08/04/2017, MRI of the liver and total spine MRI 08/11/2017 and CT scan of the chest and brain on 08/13/2017 showed liver and bone metastases  (a) liver biopsy 09/07/2017 confirms estrogen receptor positive, progesterone receptor negative, HER-2 negative metastatic breast cancer  (b) chest CT scan 08/13/2017 shows multiple bilateral pulmonary nodules.  (c) CT of the brain with and without contrast 08/26/2017 shows no evidence of metastatic disease to the brain  (d) CA 27-29 is informative  (9) fulvestrant started 08/17/2017  (a) palbociclib started 08/31/2017  (10) denosumab/Xgeva started 09/14/2017  PLAN:  Janitza is tolerating her treatments well.  We reviewed the pathology which shows that her proliferation fraction dropped from 70% to less than 1%.  This indicates initial response to the fulvestrant.  The plan is to continue the fulvestrant and Xgeva monthly and the palbociclib in 28-day cycles, as we is currently doing, and then restage mid August.  I am setting her up for an MRI of the liver because that is where we have measurable disease.  We will also be following the CA-27-29 on a monthly basis.  She will see me again on 01/04/2018.  I hope to have good news for her at that time.  If we do document response the plan will be to continue the current treatment until there is evidence of disease progression  She has a good understanding of the overall plan.  She agrees with it.  She knows to call for any issues that may develop before the next visit.   Jadah Bobak, Virgie Dad, MD  10/12/17 8:49 AM Medical Oncology and Hematology [facility] [address] [place], [place]  [address] Tel. [phone]    Fax. [phone]  Alice Rieger, am acting as scribe for Chauncey Cruel MD.  I, Lurline Del MD, have reviewed the above documentation for accuracy and completeness, and I agree with the above.

## 2017-10-12 ENCOUNTER — Inpatient Hospital Stay: Payer: 59 | Attending: Oncology

## 2017-10-12 ENCOUNTER — Inpatient Hospital Stay (HOSPITAL_BASED_OUTPATIENT_CLINIC_OR_DEPARTMENT_OTHER): Payer: 59 | Admitting: Oncology

## 2017-10-12 ENCOUNTER — Telehealth: Payer: Self-pay | Admitting: Oncology

## 2017-10-12 ENCOUNTER — Ambulatory Visit: Payer: 59

## 2017-10-12 ENCOUNTER — Inpatient Hospital Stay: Payer: 59

## 2017-10-12 VITALS — BP 128/74 | HR 88 | Temp 98.4°F | Resp 18 | Ht 66.0 in | Wt 260.4 lb

## 2017-10-12 DIAGNOSIS — Z170 Estrogen receptor positive status [ER+]: Secondary | ICD-10-CM | POA: Diagnosis not present

## 2017-10-12 DIAGNOSIS — C50012 Malignant neoplasm of nipple and areola, left female breast: Secondary | ICD-10-CM

## 2017-10-12 DIAGNOSIS — C50211 Malignant neoplasm of upper-inner quadrant of right female breast: Secondary | ICD-10-CM | POA: Diagnosis not present

## 2017-10-12 DIAGNOSIS — C50112 Malignant neoplasm of central portion of left female breast: Secondary | ICD-10-CM | POA: Insufficient documentation

## 2017-10-12 DIAGNOSIS — C7951 Secondary malignant neoplasm of bone: Secondary | ICD-10-CM | POA: Insufficient documentation

## 2017-10-12 DIAGNOSIS — C787 Secondary malignant neoplasm of liver and intrahepatic bile duct: Secondary | ICD-10-CM

## 2017-10-12 DIAGNOSIS — Z79899 Other long term (current) drug therapy: Secondary | ICD-10-CM | POA: Diagnosis not present

## 2017-10-12 DIAGNOSIS — Z79818 Long term (current) use of other agents affecting estrogen receptors and estrogen levels: Secondary | ICD-10-CM | POA: Insufficient documentation

## 2017-10-12 DIAGNOSIS — Z9013 Acquired absence of bilateral breasts and nipples: Secondary | ICD-10-CM | POA: Diagnosis not present

## 2017-10-12 DIAGNOSIS — Z923 Personal history of irradiation: Secondary | ICD-10-CM | POA: Insufficient documentation

## 2017-10-12 DIAGNOSIS — Z6841 Body Mass Index (BMI) 40.0 and over, adult: Secondary | ICD-10-CM

## 2017-10-12 DIAGNOSIS — C50011 Malignant neoplasm of nipple and areola, right female breast: Secondary | ICD-10-CM

## 2017-10-12 LAB — COMPREHENSIVE METABOLIC PANEL
ALT: 32 U/L (ref 0–55)
AST: 27 U/L (ref 5–34)
Albumin: 3.9 g/dL (ref 3.5–5.0)
Alkaline Phosphatase: 159 U/L — ABNORMAL HIGH (ref 40–150)
Anion gap: 10 (ref 3–11)
BUN: 9 mg/dL (ref 7–26)
CO2: 24 mmol/L (ref 22–29)
Calcium: 9.3 mg/dL (ref 8.4–10.4)
Chloride: 104 mmol/L (ref 98–109)
Creatinine, Ser: 0.89 mg/dL (ref 0.60–1.10)
GFR calc Af Amer: >60 mL/min (ref >60)
GFR calc non Af Amer: >60 mL/min (ref >60)
Glucose, Bld: 87 mg/dL (ref 70–140)
Potassium: 3.8 mmol/L (ref 3.5–5.1)
Sodium: 138 mmol/L (ref 136–145)
Total Bilirubin: 0.3 mg/dL (ref 0.2–1.2)
Total Protein: 7.8 g/dL (ref 6.4–8.3)

## 2017-10-12 LAB — CBC WITH DIFFERENTIAL/PLATELET
Basophils Absolute: 0.1 10*3/uL (ref 0.0–0.1)
Basophils Relative: 2 %
Eosinophils Absolute: 0.1 10*3/uL (ref 0.0–0.5)
Eosinophils Relative: 3 %
HCT: 39.3 % (ref 34.8–46.6)
Hemoglobin: 13 g/dL (ref 11.6–15.9)
Lymphocytes Relative: 34 %
Lymphs Abs: 0.7 10*3/uL — ABNORMAL LOW (ref 0.9–3.3)
MCH: 32.1 pg (ref 25.1–34.0)
MCHC: 33.1 g/dL (ref 31.5–36.0)
MCV: 97 fL (ref 79.5–101.0)
Monocytes Absolute: 0.1 10*3/uL (ref 0.1–0.9)
Monocytes Relative: 5 %
Neutro Abs: 1.1 10*3/uL — ABNORMAL LOW (ref 1.5–6.5)
Neutrophils Relative %: 56 %
Platelets: 296 10*3/uL (ref 145–400)
RBC: 4.05 MIL/uL (ref 3.70–5.45)
RDW: 15.7 % — ABNORMAL HIGH (ref 11.2–14.5)
WBC: 2.1 10*3/uL — ABNORMAL LOW (ref 3.9–10.3)

## 2017-10-12 MED ORDER — FULVESTRANT 250 MG/5ML IM SOLN
500.0000 mg | Freq: Once | INTRAMUSCULAR | Status: AC
  Administered 2017-10-12: 500 mg via INTRAMUSCULAR

## 2017-10-12 MED ORDER — FULVESTRANT 250 MG/5ML IM SOLN
INTRAMUSCULAR | Status: AC
  Filled 2017-10-12: qty 10

## 2017-10-12 MED ORDER — DENOSUMAB 120 MG/1.7ML ~~LOC~~ SOLN
SUBCUTANEOUS | Status: AC
  Filled 2017-10-12: qty 1.7

## 2017-10-12 MED ORDER — DENOSUMAB 120 MG/1.7ML ~~LOC~~ SOLN
120.0000 mg | Freq: Once | SUBCUTANEOUS | Status: AC
  Administered 2017-10-12: 120 mg via SUBCUTANEOUS

## 2017-10-12 MED FILL — IBRANCE 125 MG CAPSULE: 125 | 28 days supply | Qty: 21 | Fill #2

## 2017-10-12 NOTE — Telephone Encounter (Signed)
Gave patient AVS and calendar of upcoming appointments.  °

## 2017-10-13 ENCOUNTER — Other Ambulatory Visit: Payer: Self-pay | Admitting: Oncology

## 2017-10-13 LAB — CANCER ANTIGEN 27.29: CA 27.29: 428.2 U/mL — ABNORMAL HIGH (ref 0.0–38.6)

## 2017-10-13 NOTE — Progress Notes (Signed)
I am concerned about the continuing rise in her tumor marker.  However fulvestrant may take a little while to work.  I am going to wait at this point before restaging.  However if there is another rise with the next determination, and month from now, we will probably proceed to PET scanning.

## 2017-10-21 MED FILL — HYDROCHLOROTHIAZIDE 12.5 MG: 12.5 | 90 days supply | Qty: 90 | Fill #1

## 2017-11-09 ENCOUNTER — Inpatient Hospital Stay: Payer: 59 | Attending: Oncology

## 2017-11-09 ENCOUNTER — Inpatient Hospital Stay: Payer: 59

## 2017-11-09 DIAGNOSIS — C50112 Malignant neoplasm of central portion of left female breast: Secondary | ICD-10-CM

## 2017-11-09 DIAGNOSIS — Z923 Personal history of irradiation: Secondary | ICD-10-CM | POA: Insufficient documentation

## 2017-11-09 DIAGNOSIS — F419 Anxiety disorder, unspecified: Secondary | ICD-10-CM | POA: Insufficient documentation

## 2017-11-09 DIAGNOSIS — I10 Essential (primary) hypertension: Secondary | ICD-10-CM | POA: Diagnosis not present

## 2017-11-09 DIAGNOSIS — Z975 Presence of (intrauterine) contraceptive device: Secondary | ICD-10-CM | POA: Diagnosis not present

## 2017-11-09 DIAGNOSIS — Z79810 Long term (current) use of selective estrogen receptor modulators (SERMs): Secondary | ICD-10-CM | POA: Diagnosis not present

## 2017-11-09 DIAGNOSIS — C7951 Secondary malignant neoplasm of bone: Secondary | ICD-10-CM

## 2017-11-09 DIAGNOSIS — E039 Hypothyroidism, unspecified: Secondary | ICD-10-CM | POA: Diagnosis not present

## 2017-11-09 DIAGNOSIS — Z9013 Acquired absence of bilateral breasts and nipples: Secondary | ICD-10-CM | POA: Diagnosis not present

## 2017-11-09 DIAGNOSIS — R918 Other nonspecific abnormal finding of lung field: Secondary | ICD-10-CM | POA: Diagnosis not present

## 2017-11-09 DIAGNOSIS — Z79899 Other long term (current) drug therapy: Secondary | ICD-10-CM | POA: Diagnosis not present

## 2017-11-09 DIAGNOSIS — F329 Major depressive disorder, single episode, unspecified: Secondary | ICD-10-CM | POA: Diagnosis not present

## 2017-11-09 DIAGNOSIS — Z79818 Long term (current) use of other agents affecting estrogen receptors and estrogen levels: Secondary | ICD-10-CM | POA: Insufficient documentation

## 2017-11-09 DIAGNOSIS — C50211 Malignant neoplasm of upper-inner quadrant of right female breast: Secondary | ICD-10-CM

## 2017-11-09 DIAGNOSIS — E785 Hyperlipidemia, unspecified: Secondary | ICD-10-CM | POA: Insufficient documentation

## 2017-11-09 DIAGNOSIS — C787 Secondary malignant neoplasm of liver and intrahepatic bile duct: Secondary | ICD-10-CM

## 2017-11-09 DIAGNOSIS — Z170 Estrogen receptor positive status [ER+]: Secondary | ICD-10-CM

## 2017-11-09 DIAGNOSIS — C50011 Malignant neoplasm of nipple and areola, right female breast: Secondary | ICD-10-CM

## 2017-11-09 DIAGNOSIS — E890 Postprocedural hypothyroidism: Secondary | ICD-10-CM | POA: Diagnosis not present

## 2017-11-09 DIAGNOSIS — C50012 Malignant neoplasm of nipple and areola, left female breast: Secondary | ICD-10-CM

## 2017-11-09 DIAGNOSIS — R7301 Impaired fasting glucose: Secondary | ICD-10-CM | POA: Diagnosis not present

## 2017-11-09 LAB — COMPREHENSIVE METABOLIC PANEL
ALT: 42 U/L (ref 0–44)
AST: 34 U/L (ref 15–41)
Albumin: 3.9 g/dL (ref 3.5–5.0)
Alkaline Phosphatase: 141 U/L — ABNORMAL HIGH (ref 38–126)
Anion gap: 8 (ref 5–15)
BUN: 11 mg/dL (ref 6–20)
CO2: 27 mmol/L (ref 22–32)
Calcium: 9.6 mg/dL (ref 8.9–10.3)
Chloride: 105 mmol/L (ref 98–111)
Creatinine, Ser: 1 mg/dL (ref 0.44–1.00)
GFR calc Af Amer: >60 mL/min (ref >60)
GFR calc non Af Amer: >60 mL/min (ref >60)
Glucose, Bld: 97 mg/dL (ref 70–99)
Potassium: 3.9 mmol/L (ref 3.5–5.1)
Sodium: 140 mmol/L (ref 135–145)
Total Bilirubin: 0.3 mg/dL (ref 0.3–1.2)
Total Protein: 7.5 g/dL (ref 6.5–8.1)

## 2017-11-09 LAB — CBC WITH DIFFERENTIAL/PLATELET
Basophils Absolute: 0.1 10*3/uL (ref 0.0–0.1)
Basophils Relative: 2 %
Eosinophils Absolute: 0.1 10*3/uL (ref 0.0–0.5)
Eosinophils Relative: 4 %
HCT: 37.5 % (ref 34.8–46.6)
Hemoglobin: 12.7 g/dL (ref 11.6–15.9)
Lymphocytes Relative: 32 %
Lymphs Abs: 0.8 10*3/uL — ABNORMAL LOW (ref 0.9–3.3)
MCH: 32.8 pg (ref 25.1–34.0)
MCHC: 33.9 g/dL (ref 31.5–36.0)
MCV: 96.9 fL (ref 79.5–101.0)
Monocytes Absolute: 0.1 10*3/uL (ref 0.1–0.9)
Monocytes Relative: 6 %
Neutro Abs: 1.4 10*3/uL — ABNORMAL LOW (ref 1.5–6.5)
Neutrophils Relative %: 56 %
Platelets: 313 10*3/uL (ref 145–400)
RBC: 3.87 MIL/uL (ref 3.70–5.45)
RDW: 16.5 % — ABNORMAL HIGH (ref 11.2–14.5)
WBC: 2.5 10*3/uL — ABNORMAL LOW (ref 3.9–10.3)

## 2017-11-09 MED ORDER — DENOSUMAB 120 MG/1.7ML ~~LOC~~ SOLN
120.0000 mg | Freq: Once | SUBCUTANEOUS | Status: AC
  Administered 2017-11-09: 120 mg via SUBCUTANEOUS

## 2017-11-09 MED ORDER — DENOSUMAB 120 MG/1.7ML ~~LOC~~ SOLN
SUBCUTANEOUS | Status: AC
  Filled 2017-11-09: qty 1.7

## 2017-11-09 MED ORDER — FULVESTRANT 250 MG/5ML IM SOLN
500.0000 mg | Freq: Once | INTRAMUSCULAR | Status: AC
  Administered 2017-11-09: 500 mg via INTRAMUSCULAR

## 2017-11-09 MED ORDER — FULVESTRANT 250 MG/5ML IM SOLN
INTRAMUSCULAR | Status: AC
Start: 2017-11-09 — End: 2017-11-09
  Filled 2017-11-09: qty 10

## 2017-11-09 MED FILL — IBRANCE 125 MG CAPSULE: 125 | 28 days supply | Qty: 21 | Fill #3

## 2017-11-09 NOTE — Patient Instructions (Signed)
Fulvestrant injection What is this medicine? FULVESTRANT (ful VES trant) blocks the effects of estrogen. It is used to treat breast cancer. This medicine may be used for other purposes; ask your health care provider or pharmacist if you have questions. COMMON BRAND NAME(S): FASLODEX What should I tell my health care provider before I take this medicine? They need to know if you have any of these conditions: -bleeding problems -liver disease -low levels of platelets in the blood -an unusual or allergic reaction to fulvestrant, other medicines, foods, dyes, or preservatives -pregnant or trying to get pregnant -breast-feeding How should I use this medicine? This medicine is for injection into a muscle. It is usually given by a health care professional in a hospital or clinic setting. Talk to your pediatrician regarding the use of this medicine in children. Special care may be needed. Overdosage: If you think you have taken too much of this medicine contact a poison control center or emergency room at once. NOTE: This medicine is only for you. Do not share this medicine with others. What if I miss a dose? It is important not to miss your dose. Call your doctor or health care professional if you are unable to keep an appointment. What may interact with this medicine? -medicines that treat or prevent blood clots like warfarin, enoxaparin, and dalteparin This list may not describe all possible interactions. Give your health care provider a list of all the medicines, herbs, non-prescription drugs, or dietary supplements you use. Also tell them if you smoke, drink alcohol, or use illegal drugs. Some items may interact with your medicine. What should I watch for while using this medicine? Your condition will be monitored carefully while you are receiving this medicine. You will need important blood work done while you are taking this medicine. Do not become pregnant while taking this medicine or for  at least 1 year after stopping it. Women of child-bearing potential will need to have a negative pregnancy test before starting this medicine. Women should inform their doctor if they wish to become pregnant or think they might be pregnant. There is a potential for serious side effects to an unborn child. Men should inform their doctors if they wish to father a child. This medicine may lower sperm counts. Talk to your health care professional or pharmacist for more information. Do not breast-feed an infant while taking this medicine or for 1 year after the last dose. What side effects may I notice from receiving this medicine? Side effects that you should report to your doctor or health care professional as soon as possible: -allergic reactions like skin rash, itching or hives, swelling of the face, lips, or tongue -feeling faint or lightheaded, falls -pain, tingling, numbness, or weakness in the legs -signs and symptoms of infection like fever or chills; cough; flu-like symptoms; sore throat -vaginal bleeding Side effects that usually do not require medical attention (report to your doctor or health care professional if they continue or are bothersome): -aches, pains -constipation -diarrhea -headache -hot flashes -nausea, vomiting -pain at site where injected -stomach pain This list may not describe all possible side effects. Call your doctor for medical advice about side effects. You may report side effects to FDA at 1-800-FDA-1088. Where should I keep my medicine? This drug is given in a hospital or clinic and will not be stored at home. NOTE: This sheet is a summary. It may not cover all possible information. If you have questions about this medicine, talk to your   doctor, pharmacist, or health care provider.  2018 [employer]/Gold Standard (2014-11-23 11:03:55) Denosumab injection What is this medicine? DENOSUMAB (den oh sue mab) slows bone breakdown. Prolia is used to treat osteoporosis in  women after menopause and in men. Delton See is used to treat a high calcium level due to cancer and to prevent bone fractures and other bone problems caused by multiple myeloma or cancer bone metastases. Delton See is also used to treat giant cell tumor of the bone. This medicine may be used for other purposes; ask your health care provider or pharmacist if you have questions. COMMON BRAND NAME(S): Prolia, XGEVA What should I tell my health care provider before I take this medicine? They need to know if you have any of these conditions: -dental disease -having surgery or tooth extraction -infection -kidney disease -low levels of calcium or Vitamin D in the blood -malnutrition -on hemodialysis -skin conditions or sensitivity -thyroid or parathyroid disease -an unusual reaction to denosumab, other medicines, foods, dyes, or preservatives -pregnant or trying to get pregnant -breast-feeding How should I use this medicine? This medicine is for injection under the skin. It is given by a health care professional in a hospital or clinic setting. If you are getting Prolia, a special MedGuide will be given to you by the pharmacist with each prescription and refill. Be sure to read this information carefully each time. For Prolia, talk to your pediatrician regarding the use of this medicine in children. Special care may be needed. For Delton See, talk to your pediatrician regarding the use of this medicine in children. While this drug may be prescribed for children as young as 13 years for selected conditions, precautions do apply. Overdosage: If you think you have taken too much of this medicine contact a poison control center or emergency room at once. NOTE: This medicine is only for you. Do not share this medicine with others. What if I miss a dose? It is important not to miss your dose. Call your doctor or health care professional if you are unable to keep an appointment. What may interact with this  medicine? Do not take this medicine with any of the following medications: -other medicines containing denosumab This medicine may also interact with the following medications: -medicines that lower your chance of fighting infection -steroid medicines like prednisone or cortisone This list may not describe all possible interactions. Give your health care provider a list of all the medicines, herbs, non-prescription drugs, or dietary supplements you use. Also tell them if you smoke, drink alcohol, or use illegal drugs. Some items may interact with your medicine. What should I watch for while using this medicine? Visit your doctor or health care professional for regular checks on your progress. Your doctor or health care professional may order blood tests and other tests to see how you are doing. Call your doctor or health care professional for advice if you get a fever, chills or sore throat, or other symptoms of a cold or flu. Do not treat yourself. This drug may decrease your body's ability to fight infection. Try to avoid being around people who are sick. You should make sure you get enough calcium and vitamin D while you are taking this medicine, unless your doctor tells you not to. Discuss the foods you eat and the vitamins you take with your health care professional. See your dentist regularly. Brush and floss your teeth as directed. Before you have any dental work done, tell your dentist you are receiving this medicine. Do  not become pregnant while taking this medicine or for 5 months after stopping it. Talk with your doctor or health care professional about your birth control options while taking this medicine. Women should inform their doctor if they wish to become pregnant or think they might be pregnant. There is a potential for serious side effects to an unborn child. Talk to your health care professional or pharmacist for more information. What side effects may I notice from receiving this  medicine? Side effects that you should report to your doctor or health care professional as soon as possible: -allergic reactions like skin rash, itching or hives, swelling of the face, lips, or tongue -bone pain -breathing problems -dizziness -jaw pain, especially after dental work -redness, blistering, peeling of the skin -signs and symptoms of infection like fever or chills; cough; sore throat; pain or trouble passing urine -signs of low calcium like fast heartbeat, muscle cramps or muscle pain; pain, tingling, numbness in the hands or feet; seizures -unusual bleeding or bruising -unusually weak or tired Side effects that usually do not require medical attention (report to your doctor or health care professional if they continue or are bothersome): -constipation -diarrhea -headache -joint pain -loss of appetite -muscle pain -runny nose -tiredness -upset stomach This list may not describe all possible side effects. Call your doctor for medical advice about side effects. You may report side effects to FDA at 1-800-FDA-1088. Where should I keep my medicine? This medicine is only given in a clinic, doctor's office, or other health care setting and will not be stored at home. NOTE: This sheet is a summary. It may not cover all possible information. If you have questions about this medicine, talk to your doctor, pharmacist, or health care provider.  2018 [employer]/Gold Standard (2016-05-19 19:17:21)  

## 2017-11-10 LAB — CANCER ANTIGEN 27.29: CA 27.29: 423.9 U/mL — ABNORMAL HIGH (ref 0.0–38.6)

## 2017-11-17 MED FILL — LEVOTHYROXINE 137 MCG TABLE: 137 | 90 days supply | Qty: 90 | Fill #1

## 2017-11-29 MED FILL — LIVALO 2 MG TABLET: 2 | 90 days supply | Qty: 90 | Fill #2

## 2017-12-07 ENCOUNTER — Encounter: Payer: Self-pay | Admitting: Adult Health

## 2017-12-07 ENCOUNTER — Telehealth: Payer: Self-pay | Admitting: Adult Health

## 2017-12-07 ENCOUNTER — Inpatient Hospital Stay (HOSPITAL_BASED_OUTPATIENT_CLINIC_OR_DEPARTMENT_OTHER): Payer: 59 | Admitting: Adult Health

## 2017-12-07 ENCOUNTER — Other Ambulatory Visit: Payer: 59

## 2017-12-07 ENCOUNTER — Inpatient Hospital Stay: Payer: 59

## 2017-12-07 ENCOUNTER — Ambulatory Visit: Payer: 59

## 2017-12-07 VITALS — BP 118/58 | HR 87 | Temp 97.9°F | Resp 18 | Ht 66.0 in | Wt 265.2 lb

## 2017-12-07 DIAGNOSIS — Z170 Estrogen receptor positive status [ER+]: Secondary | ICD-10-CM

## 2017-12-07 DIAGNOSIS — C787 Secondary malignant neoplasm of liver and intrahepatic bile duct: Secondary | ICD-10-CM

## 2017-12-07 DIAGNOSIS — Z923 Personal history of irradiation: Secondary | ICD-10-CM

## 2017-12-07 DIAGNOSIS — C7951 Secondary malignant neoplasm of bone: Secondary | ICD-10-CM | POA: Diagnosis not present

## 2017-12-07 DIAGNOSIS — C50011 Malignant neoplasm of nipple and areola, right female breast: Secondary | ICD-10-CM

## 2017-12-07 DIAGNOSIS — Z9013 Acquired absence of bilateral breasts and nipples: Secondary | ICD-10-CM | POA: Diagnosis not present

## 2017-12-07 DIAGNOSIS — C50211 Malignant neoplasm of upper-inner quadrant of right female breast: Secondary | ICD-10-CM

## 2017-12-07 DIAGNOSIS — Z79810 Long term (current) use of selective estrogen receptor modulators (SERMs): Secondary | ICD-10-CM

## 2017-12-07 DIAGNOSIS — Z79818 Long term (current) use of other agents affecting estrogen receptors and estrogen levels: Secondary | ICD-10-CM

## 2017-12-07 DIAGNOSIS — R918 Other nonspecific abnormal finding of lung field: Secondary | ICD-10-CM

## 2017-12-07 DIAGNOSIS — C50112 Malignant neoplasm of central portion of left female breast: Secondary | ICD-10-CM | POA: Diagnosis not present

## 2017-12-07 DIAGNOSIS — C50012 Malignant neoplasm of nipple and areola, left female breast: Secondary | ICD-10-CM

## 2017-12-07 DIAGNOSIS — Z975 Presence of (intrauterine) contraceptive device: Secondary | ICD-10-CM

## 2017-12-07 DIAGNOSIS — Z79899 Other long term (current) drug therapy: Secondary | ICD-10-CM

## 2017-12-07 LAB — CBC WITH DIFFERENTIAL/PLATELET
Basophils Absolute: 0.1 10*3/uL (ref 0.0–0.1)
Basophils Relative: 3 %
Eosinophils Absolute: 0.1 10*3/uL (ref 0.0–0.5)
Eosinophils Relative: 5 %
HCT: 39.3 % (ref 34.8–46.6)
Hemoglobin: 13.5 g/dL (ref 11.6–15.9)
Lymphocytes Relative: 37 %
Lymphs Abs: 1 10*3/uL (ref 0.9–3.3)
MCH: 34.4 pg — ABNORMAL HIGH (ref 25.1–34.0)
MCHC: 34.4 g/dL (ref 31.5–36.0)
MCV: 100 fL (ref 79.5–101.0)
Monocytes Absolute: 0.1 10*3/uL (ref 0.1–0.9)
Monocytes Relative: 5 %
Neutro Abs: 1.3 10*3/uL — ABNORMAL LOW (ref 1.5–6.5)
Neutrophils Relative %: 50 %
Platelets: 292 10*3/uL (ref 145–400)
RBC: 3.93 MIL/uL (ref 3.70–5.45)
RDW: 16 % — ABNORMAL HIGH (ref 11.2–14.5)
WBC: 2.6 10*3/uL — ABNORMAL LOW (ref 3.9–10.3)

## 2017-12-07 LAB — COMPREHENSIVE METABOLIC PANEL
ALT: 48 U/L — ABNORMAL HIGH (ref 0–44)
AST: 41 U/L (ref 15–41)
Albumin: 3.9 g/dL (ref 3.5–5.0)
Alkaline Phosphatase: 124 U/L (ref 38–126)
Anion gap: 9 (ref 5–15)
BUN: 11 mg/dL (ref 6–20)
CO2: 27 mmol/L (ref 22–32)
Calcium: 9.9 mg/dL (ref 8.9–10.3)
Chloride: 106 mmol/L (ref 98–111)
Creatinine, Ser: 1.12 mg/dL — ABNORMAL HIGH (ref 0.44–1.00)
GFR calc Af Amer: >60 mL/min (ref >60)
GFR calc non Af Amer: 55 mL/min — ABNORMAL LOW (ref >60)
Glucose, Bld: 89 mg/dL (ref 70–99)
Potassium: 4.1 mmol/L (ref 3.5–5.1)
Sodium: 142 mmol/L (ref 135–145)
Total Bilirubin: 0.3 mg/dL (ref 0.3–1.2)
Total Protein: 7.9 g/dL (ref 6.5–8.1)

## 2017-12-07 MED ORDER — FULVESTRANT 250 MG/5ML IM SOLN
500.0000 mg | Freq: Once | INTRAMUSCULAR | Status: AC
  Administered 2017-12-07: 500 mg via INTRAMUSCULAR

## 2017-12-07 MED ORDER — DENOSUMAB 120 MG/1.7ML ~~LOC~~ SOLN
120.0000 mg | Freq: Once | SUBCUTANEOUS | Status: AC
  Administered 2017-12-07: 120 mg via SUBCUTANEOUS

## 2017-12-07 MED ORDER — DENOSUMAB 120 MG/1.7ML ~~LOC~~ SOLN
SUBCUTANEOUS | Status: AC
  Filled 2017-12-07: qty 1.7

## 2017-12-07 NOTE — Telephone Encounter (Signed)
Per 7/30 los, added  labs with injections. Mailed calendar.

## 2017-12-07 NOTE — Telephone Encounter (Signed)
Patient scheduled per [facility] request. Patient aware of date and time.

## 2017-12-07 NOTE — Progress Notes (Signed)
[place]  [phone] [phone]) [phone]     ID: Debbie [name] DOB: January 14, 1966  MR#: [id number]  TGG#:[id number]  Patient Care Team: Kelton Pillar, MD as PCP - General (Family Medicine) Jacelyn Pi, MD as Consulting Physician (Endocrinology) Leo Grosser Seymour Bars, MD as Consulting Physician (Obstetrics and Gynecology) Jovita Kussmaul, MD as Consulting Physician (General Surgery) [name], Virgie Dad, MD as Consulting Physician (Oncology) [name], MD as Attending Physician (Radiation Oncology) Belva Crome, MD as Consulting Physician (Cardiology) Crissie Reese, MD as Consulting Physician (Plastic Surgery) [name], [name], NP as Nurse Practitioner (Hematology and Oncology) PCP: Kelton Pillar, MD OTHER MD:  CHIEF COMPLAINT: Estrogen receptor positive stage IV  breast cancer  CURRENT TREATMENT:  Fulvestrant, palbociclib, denosumab/Xgeva  INTERVAL HISTORY: Debbie [name] returns today for follow-up of her estrogen receptor positive stage IV breast cancer. She continues on daily palbociclib, currently at 125 mg 3 weeks on and 1 week off.  She has  She is slightly fatigued, and she has an infrequent diarrhea.    She also receives fulvestrant every 4 weeks with a dose due today. She tolerates this well.  She does have some hot flashes from time to time.  Finally, she receives denosumab/Xgeva every 4 weeks.  She did have a partial plate placed and this is doing well.  She hasn't had any dental work since April, 2019.  This area has healed.      REVIEW OF SYSTEMS: Debbie [name] is doing well today.  She is working 60 hours per week.  She sometimes will have a wave of nausea, but this is short lived.  She remains active and busy. She continues to have some lower back pain, and hip pain.  She relieves this with advil and tylenol.  She has a script for tramadol, but doesn't take it because she doesn't feel that she needs it.  She also applies heating pads  to the area.    She denies fevers, chills, headaches, vision changes, decreased appetite, vomiting, constipation, chest pain, palpitations, or shortness of breath.  A detailed ROS was otherwise non contributory.     PAST MEDICAL HISTORY: Past Medical History:  Diagnosis Date  . Anxiety   . Bone metastasis ([place])   . Breast cancer of upper-inner quadrant of right female breast [facility]) oncologist-  dr [name]--- 04/ 2019 ER/PR+ Stage IV w/ metastatic disease to liver, total spine, lung, bone   dx 06/ 2017  right breast invasive ductal carcinoma, Stage IIB, ER/PR + (pT2 pN1), grade 1-- s/p  right breast lumpecotmy w/ sln bx's and bilateral breast reduction 11-28-2015/  left breast with reduction , dx invasive ductal ca, Grade 1 (pT1cpNX) ER/PR positive/  adjuvant radiation completed 03-27-2016 to right chest wall, 2018  reclassifiec Stage IIA  . Cancer, metastatic to liver ([place])   . Chronic back pain   . Depression   . Family history of adverse reaction to anesthesia     mother has Copd was kept on vent   . Headache   . Heart murmur   . History of hyperthyroidism    s/p  RAI  . History of radiation therapy 02/11/16- 03/27/16   Right Chest Wall 50.4 Gy in 28 fractions, Right Chest Wall Boost 10 Gy in 5 fractions.   . Hyperlipidemia   . Hypertension   . Hypothyroidism, postradioiodine therapy    endocrinoloigst-  dr balan--  2004  s/p  RAI  . Menorrhagia 2011  . Multiple pulmonary nodules    secondary  metastatic  . PONV (postoperative nausea and vomiting)   . SUI (stress urinary incontinence, female)   . Wears contact lenses   . Wears dentures    full upper and lower partial    PAST SURGICAL HISTORY: Past Surgical History:  Procedure Laterality Date  . BREAST LUMPECTOMY WITH NEEDLE LOCALIZATION AND AXILLARY SENTINEL LYMPH NODE BX Right 11/28/2015   Procedure: RIGHT BREAST LUMPECTOMY WITH NEEDLE LOCALIZATION AND AXILLARY SENTINEL LYMPH NODE BX;  Surgeon: Autumn Messing III, MD;  Location:  [facility];  Service: General;  Laterality: Right;  . BREAST REDUCTION SURGERY Bilateral 11/28/2015   Procedure: MAMMARY REDUCTION  (BREAST) BILATERAL;  Surgeon: Crissie Reese, MD;  Location: [facility];  Service: Plastics;  Laterality: Bilateral;  . CESAREAN SECTION  01/12/1993  . COLONOSCOPY    . HYSTEROSCOPY N/A 09/02/2017   Procedure: HYSTEROSCOPY  to remove IUD;  Surgeon: Eldred Manges, MD;  Location: [facility];  Service: Gynecology;  Laterality: N/A;  . MASTECTOMY W/ SENTINEL NODE BIOPSY Bilateral 12/26/2015   Procedure: BILATERAL MASTECTOMY WITH LEFT SENTINEL LYMPH NODE BIOPSY;  Surgeon: Autumn Messing III, MD;  Location: [facility];  Service: General;  Laterality: Bilateral;  . TUBAL LIGATION Bilateral 1995    FAMILY HISTORY Family History  Problem Relation Age of Onset  . Diabetes Mother   . Hypertension Mother   . Hypertension Father   . Diabetes Maternal Grandmother   . Hypertension Sister   . Cancer Paternal Grandmother        colon  . Colon cancer Paternal Grandmother   . Esophageal cancer Neg Hx   . Stomach cancer Neg Hx   . Rectal cancer Neg Hx   The patient's parents are still living, in their early 106s as of June 2017. The patient has one brother, 2 sisters. On the maternal side there is a history of uterine and prostate cancer. On the paternal side there is a history of colon cancer and possibly uterine cancer. There is no history of breast or ovarian cancer in the family.   GYNECOLOGIC HISTORY:  No LMP recorded. (Menstrual status: IUD).  Menarche age 79. She still having regular periods. She is GX P1, first pregnancy age 22. She used oral contraceptives for a few years remotely, with no complications. She is currently on a Mirena IUD and also is status post bilateral tubal ligation.  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 Her husband Debbie [name] is a Administrator. Their son Debbie [name] is a radio DJ. The patient has no grandchildren. She is  not a Ambulance person.   ADVANCED DIRECTIVES: Not in place   HEALTH MAINTENANCE: Social History   Tobacco Use  . Smoking status: Never Smoker  . Smokeless tobacco: Never Used  Substance Use Topics  . Alcohol use: No  . Drug use: No     Colonoscopy: January 2016   PAP: January 2016   Bone density: August 2017 showed a T score of +0.7 normal  Lipid panel:  Allergies  Allergen Reactions  . Ace Inhibitors Cough  . Atorvastatin Other (See Comments)    Joint pain  . Simvastatin Other (See Comments)    Joint pain    Current Outpatient Medications  Medication Sig Dispense Refill  . acetaminophen (TYLENOL) 325 MG tablet Take 650 mg by mouth every 6 (six) hours as needed for mild pain. Reported on 10/23/2015    . Calcium-Magnesium 250-125 MG TABS Take 1 tablet by mouth daily. 120 each  4  . Cetirizine HCl (ZYRTEC ALLERGY PO) Take 1 tablet by mouth daily as needed (allergies).     . hydrochlorothiazide (MICROZIDE) 12.5 MG capsule Take 12.5 mg by mouth every morning.     Marland Kitchen ibuprofen (ADVIL,MOTRIN) 600 MG tablet Take 1 tablet (600 mg total) by mouth every 6 (six) hours as needed for moderate pain. 60 tablet 0  . levonorgestrel (MIRENA) 20 MCG/24HR IUD 1 each by Intrauterine route once.    Marland Kitchen levothyroxine (SYNTHROID, LEVOTHROID) 137 MCG tablet Take 137 mcg by mouth daily before breakfast.    . LIVALO 2 MG TABS Take 0.2 mg by mouth daily.   6  . losartan (COZAAR) 100 MG tablet Take 100 mg by mouth every morning.     . metFORMIN (GLUCOPHAGE-XR) 750 MG 24 hr tablet Take 750 mg by mouth every evening.   6  . Multiple Vitamin (MULTIVITAMIN) tablet Take 1 tablet by mouth daily.    Marland Kitchen omega-3 acid ethyl esters (LOVAZA) 1 g capsule Take 1 g by mouth 2 (two) times daily.     . palbociclib (IBRANCE) 125 MG capsule Take 1 capsule (125 mg total) by mouth daily with lunch. Take for 21 days on, 7 days off, repeat every 28 days. Take whole with food. 21 capsule 6  . potassium chloride SA  (K-DUR,KLOR-CON) 20 MEQ tablet Take 40 mEq by mouth 2 (two) times daily.     . traMADol (ULTRAM) 50 MG tablet Take 1 tablet (50 mg total) by mouth every 6 (six) hours as needed. 60 tablet 3  . Vitamin D, Cholecalciferol, 1000 units CAPS Take 1 tablet by mouth daily. 90 capsule 4   No current facility-administered medications for this visit.     OBJECTIVE:   Vitals:   12/07/17 0808  BP: (!) 118/58  Pulse: 87  Resp: 18  Temp: 97.9 F (36.6 C)  SpO2: 100%     Body mass index is 42.8 kg/m.    ECOG FS:1 - Symptomatic but completely ambulatory  Filed Weights   12/07/17 0808  Weight: 265 lb 3.2 oz (120.3 kg)  GENERAL: Patient is a well appearing female in no acute distress HEENT:  Sclerae anicteric.  Oropharynx clear and moist. No ulcerations or evidence of oropharyngeal candidiasis. Neck is supple.  NODES:  No cervical, supraclavicular, or axillary lymphadenopathy palpated.  BREAST EXAM:  Deferred. LUNGS:  Clear to auscultation bilaterally.  No wheezes or rhonchi. HEART:  Regular rate and rhythm. No murmur appreciated. ABDOMEN:  Soft, nontender.  Positive, normoactive bowel sounds. No organomegaly palpated. MSK:  No focal spinal tenderness to palpation. Full range of motion bilaterally in the upper extremities. EXTREMITIES:  No peripheral edema.   SKIN:  Clear with no obvious rashes or skin changes. No nail dyscrasia. NEURO:  Nonfocal. Well oriented.  Appropriate affect.      LAB RESULTS:  CMP     Component Value Date/Time   NA 140 11/09/2017 0840   NA 140 07/08/2016 0948   K 3.9 11/09/2017 0840   K 4.1 07/08/2016 0948   CL 105 11/09/2017 0840   CO2 27 11/09/2017 0840   CO2 24 07/08/2016 0948   GLUCOSE 97 11/09/2017 0840   GLUCOSE 89 07/08/2016 0948   BUN 11 11/09/2017 0840   BUN 11.9 07/08/2016 0948   CREATININE 1.00 11/09/2017 0840   CREATININE 0.90 06/22/2017 1147   CREATININE 0.9 07/08/2016 0948   CALCIUM 9.6 11/09/2017 0840   CALCIUM 10.0 07/08/2016 0948    PROT 7.5 11/09/2017  0840   PROT 7.6 07/08/2016 0948   ALBUMIN 3.9 11/09/2017 0840   ALBUMIN 3.6 07/08/2016 0948   AST 34 11/09/2017 0840   AST 31 06/22/2017 1147   AST 22 07/08/2016 0948   ALT 42 11/09/2017 0840   ALT 45 06/22/2017 1147   ALT 27 07/08/2016 0948   ALKPHOS 141 (H) 11/09/2017 0840   ALKPHOS 115 07/08/2016 0948   BILITOT 0.3 11/09/2017 0840   BILITOT 0.4 06/22/2017 1147   BILITOT 0.35 07/08/2016 0948   GFRNONAA >60 11/09/2017 0840   GFRNONAA >60 06/22/2017 1147   GFRAA >60 11/09/2017 0840   GFRAA >60 06/22/2017 1147    INo results found for: SPEP, UPEP  Lab Results  Component Value Date   WBC 2.5 (L) 11/09/2017   NEUTROABS 1.4 (L) 11/09/2017   HGB 12.7 11/09/2017   HCT 37.5 11/09/2017   MCV 96.9 11/09/2017   PLT 313 11/09/2017      Chemistry      Component Value Date/Time   NA 140 11/09/2017 0840   NA 140 07/08/2016 0948   K 3.9 11/09/2017 0840   K 4.1 07/08/2016 0948   CL 105 11/09/2017 0840   CO2 27 11/09/2017 0840   CO2 24 07/08/2016 0948   BUN 11 11/09/2017 0840   BUN 11.9 07/08/2016 0948   CREATININE 1.00 11/09/2017 0840   CREATININE 0.90 06/22/2017 1147   CREATININE 0.9 07/08/2016 0948      Component Value Date/Time   CALCIUM 9.6 11/09/2017 0840   CALCIUM 10.0 07/08/2016 0948   ALKPHOS 141 (H) 11/09/2017 0840   ALKPHOS 115 07/08/2016 0948   AST 34 11/09/2017 0840   AST 31 06/22/2017 1147   AST 22 07/08/2016 0948   ALT 42 11/09/2017 0840   ALT 45 06/22/2017 1147   ALT 27 07/08/2016 0948   BILITOT 0.3 11/09/2017 0840   BILITOT 0.4 06/22/2017 1147   BILITOT 0.35 07/08/2016 0948       No results found for: LABCA2  No components found for: LABCA125  No results for input(s): INR in the last 168 hours.  Urinalysis No results found for: COLORURINE, APPEARANCEUR, LABSPEC, PHURINE, GLUCOSEU, HGBUR, BILIRUBINUR, KETONESUR, PROTEINUR, UROBILINOGEN, NITRITE, LEUKOCYTESUR   STUDIES: No results found.  ELIGIBLE FOR AVAILABLE RESEARCH  PROTOCOL: PALLAS, but inelegible because of Mirena IUD  ASSESSMENT: 52 y.o. [place] woman status post right breast upper inner quadrant biopsy 10/15/2015 for a clinical T2-T3 N0, stage II invasive ductal carcinoma, estrogen and progesterone receptor positive, HER-2 nonamplified, with an Mib-1 of 70%  (1) status post right lumpectomy and sentinel lymph node sampling with oncoplastic breast reduction 11/28/2015 for a pT2 pN1, stage IIB invasive ductal carcinoma, grade 1, with negative margins (1/1 sentinel node positive)  (a) reclassified as stage IIA in the 2018 prognostic revision   (2) left reduction mammoplasty 11/28/2015 unexpectedly found a pT1c pNX invasive ductal carcinoma, grade 1, estrogen and progesterone receptor positive, HER-2 not amplified, with an MIB-1 of 3%  (3) Mammaprint from the right sided breast cancer was "low risk", predicting a 98% chance of no disease recurrence within 5 years with anti-estrogens as the only systemic therapy. It also predicts minimal to no benefit from chemotherapy  (4) status post bilateral mastectomies with bilateral sentinel lymph node sampling 12/26/2015, showing  (a) on the right, no residual carcinoma (0/3 nodes involved)  (b) on the left, focal ductal carcinoma in situ, 0.2 cm, with negative margins; (0/4 nodes involved)  (5) adjuvant radiation 02/11/16 - 03/27/16  1) Right chest wall: 50.4 Gy in 28 fractions               2) Right chest wall boost: 10 Gy in 5 fractions   (6) started on tamoxifen neoadjuvantly 10/23/2015 to allow for expected delays in definitive local treatment  (a) Mirena IUD in place  (b) tamoxifen discontinued 08/13/2017, with progression  (7) since she had 4 lymph nodes removed from each axilla but also receive radiation on the right, lab draws should preferentially be obtained from the left arm   METASTATIC DISEASE: April 2019 (8) CT scan of the abdomen and pelvis 08/04/2017, MRI of the liver and total spine MRI  08/11/2017 and CT scan of the chest and brain on 08/13/2017 showed liver and bone metastases  (a) liver biopsy 09/07/2017 confirms estrogen receptor positive, progesterone receptor negative, HER-2 negative metastatic breast cancer  (b) chest CT scan 08/13/2017 shows multiple bilateral pulmonary nodules.  (c) CT of the brain with and without contrast 08/26/2017 shows no evidence of metastatic disease to the brain  (d) CA 27-29 is informative  (9) fulvestrant started 08/17/2017  (a) palbociclib started 08/31/2017  (10) denosumab/Xgeva started 09/14/2017  PLAN:  Gwendolyne continues to do well today.  She has no clinical sign of progression.  She has not had labs drawn yet, so this will need to be added to her appointment today.  She will also need to be restaged and I have ordered MRI liver and bone scan to be done one week prior to her appointment with Dr. [name].    Abra appears to be tolerating her treatments well, and will continue these so long as her labs are within parameters.    Fareeda will return 01/04/2018 for labs, f/u with Dr. [name], and her injections.  She knows to call for any questions or concerns in the interim.    A total of (30) minutes of face-to-face time was spent with this patient with greater than 50% of that time in counseling and care-coordination.   Wilber Bihari, NP  12/07/17 8:19 AM Medical Oncology and Hematology [facility] [address] [place], [place] [address] Tel. [phone]    Fax. [phone]

## 2017-12-08 ENCOUNTER — Telehealth: Payer: Self-pay

## 2017-12-08 LAB — CANCER ANTIGEN 27.29: CA 27.29: 386.4 U/mL — ABNORMAL HIGH (ref 0.0–38.6)

## 2017-12-08 NOTE — Telephone Encounter (Signed)
Spoke with patient informing of lab result.  Patient happy with results and thanked nurse for call.  Understands to call center with any issues.

## 2017-12-08 NOTE — Telephone Encounter (Signed)
-----   Message from Gardenia Phlegm, NP sent at 12/08/2017  6:40 AM EDT ----- Please call patient with results ----- Message ----- From: Buel Ream, Lab In [place] Sent: 12/07/2017   9:03 AM To: Chauncey Cruel, MD

## 2017-12-10 MED FILL — IBRANCE 125 MG CAPSULE: 125 | 28 days supply | Qty: 21 | Fill #4

## 2017-12-24 ENCOUNTER — Encounter (HOSPITAL_COMMUNITY)
Admission: RE | Admit: 2017-12-24 | Discharge: 2017-12-24 | Disposition: A | Discharge status: Home / Self Care | Payer: 59 | Source: Ambulatory Visit | Attending: Adult Health | Admitting: Adult Health

## 2017-12-24 DIAGNOSIS — C50112 Malignant neoplasm of central portion of left female breast: Secondary | ICD-10-CM | POA: Insufficient documentation

## 2017-12-24 DIAGNOSIS — C787 Secondary malignant neoplasm of liver and intrahepatic bile duct: Secondary | ICD-10-CM

## 2017-12-24 DIAGNOSIS — C7800 Secondary malignant neoplasm of unspecified lung: Secondary | ICD-10-CM | POA: Diagnosis not present

## 2017-12-24 DIAGNOSIS — C7951 Secondary malignant neoplasm of bone: Secondary | ICD-10-CM

## 2017-12-24 DIAGNOSIS — Z170 Estrogen receptor positive status [ER+]: Secondary | ICD-10-CM | POA: Diagnosis not present

## 2017-12-24 DIAGNOSIS — C50919 Malignant neoplasm of unspecified site of unspecified female breast: Secondary | ICD-10-CM | POA: Diagnosis not present

## 2017-12-24 MED ORDER — TECHNETIUM TC 99M MEDRONATE IV KIT
20.0000 | PACK | Freq: Once | INTRAVENOUS | Status: AC | PRN
  Administered 2017-12-24: 21 via INTRAVENOUS

## 2017-12-28 ENCOUNTER — Ambulatory Visit (HOSPITAL_COMMUNITY)
Admission: RE | Admit: 2017-12-28 | Discharge: 2017-12-28 | Disposition: A | Discharge status: Home / Self Care | Payer: 59 | Source: Ambulatory Visit | Attending: Adult Health | Admitting: Adult Health

## 2017-12-28 DIAGNOSIS — C7800 Secondary malignant neoplasm of unspecified lung: Secondary | ICD-10-CM | POA: Diagnosis not present

## 2017-12-28 DIAGNOSIS — Z170 Estrogen receptor positive status [ER+]: Secondary | ICD-10-CM

## 2017-12-28 DIAGNOSIS — C50112 Malignant neoplasm of central portion of left female breast: Secondary | ICD-10-CM | POA: Diagnosis not present

## 2017-12-28 DIAGNOSIS — C7951 Secondary malignant neoplasm of bone: Secondary | ICD-10-CM

## 2017-12-28 DIAGNOSIS — C50911 Malignant neoplasm of unspecified site of right female breast: Secondary | ICD-10-CM | POA: Diagnosis not present

## 2017-12-28 DIAGNOSIS — K760 Fatty (change of) liver, not elsewhere classified: Secondary | ICD-10-CM | POA: Insufficient documentation

## 2017-12-28 DIAGNOSIS — C787 Secondary malignant neoplasm of liver and intrahepatic bile duct: Secondary | ICD-10-CM

## 2017-12-28 MED ORDER — GADOBENATE DIMEGLUMINE 529 MG/ML IV SOLN
20.0000 mL | Freq: Once | INTRAVENOUS | Status: AC | PRN
  Administered 2017-12-28: 20 mL via INTRAVENOUS

## 2017-12-29 ENCOUNTER — Other Ambulatory Visit: Payer: Self-pay | Admitting: Oncology

## 2018-01-03 NOTE — Progress Notes (Signed)
[place]  [phone] [phone]     ID: ASHLA MURPH DOB: 28-Feb-1966  MR#: [id number]  JDB#:[id number]  Patient Care Team: Kelton Pillar, MD as PCP - General (Family Medicine) Jacelyn Pi, MD as Consulting Physician (Endocrinology) Leo Grosser Seymour Bars, MD as Consulting Physician (Obstetrics and Gynecology) Jovita Kussmaul, MD as Consulting Physician (General Surgery) [name], Virgie Dad, MD as Consulting Physician (Oncology) [name], MD as Attending Physician (Radiation Oncology) Belva Crome, MD as Consulting Physician (Cardiology) Crissie Reese, MD as Consulting Physician (Plastic Surgery) [name], [name], NP as Nurse Practitioner (Hematology and Oncology) PCP: Kelton Pillar, MD OTHER MD:  CHIEF COMPLAINT: Estrogen receptor positive stage IV  breast cancer  CURRENT TREATMENT:  Anastrozole, affinitor; denosumab/Xgeva  INTERVAL HISTORY: Debbie [name] returns today for follow-up of her estrogen receptor positive stage IV breast cancer. She continues on palbociclib, currently at 125 mg daily, 21 days on and 7 days off. She tolerates this well. She denies issues with fatigue or diarrhea.   She also receives fulvestrant given every 4 weeks. She tolerates this well without any complications.   Finally, she receives denosumab/ Xgeva every 4 weeks. She also tolerates this well without any complications from the injection.   Since her last visit here she had a bone scan 12/25/2017 and an MRI of the liver with and without contrast 12/28/2017.  The liver, which is our area of measurable disease, shows some evidence of growth as compared to April of this year.  The bone scan of course is more difficult to interpret but there is an area of increased activity in the proximal left femur which requires attention.  Her CA-27-29 has not changed appreciably, from 378 3 months ago to 386 1 month ago, with repeat pending.     REVIEW OF  SYSTEMS: Debbie [name] reports that when she stand up, she feels pain in her left hip. She also has difficulty standing and walking at times. She also has some pain in her low back. She says her pain is not significant enough to take pain medication. She last took analgesics two weeks ago. She has a dry cough. She notes that her family is supportive and worry about her. She takes stool softener for constipation. She notes that her sugar levels are good. She denies unusual headaches, visual changes, nausea, vomiting, or dizziness. There has been no unusual cough, phlegm production, or pleurisy. There has been no change in bowel or bladder habits. She denies unexplained fatigue or unexplained weight loss, bleeding, rash, or fever. A detailed review of systems was otherwise stable.    PAST MEDICAL HISTORY: Past Medical History:  Diagnosis Date  . Anxiety   . Bone metastasis ([place])   . Breast cancer of upper-inner quadrant of right female breast [facility]) oncologist-  dr [name]--- 04/ 2019 ER/PR+ Stage IV w/ metastatic disease to liver, total spine, lung, bone   dx 06/ 2017  right breast invasive ductal carcinoma, Stage IIB, ER/PR + (pT2 pN1), grade 1-- s/p  right breast lumpecotmy w/ sln bx's and bilateral breast reduction 11-28-2015/  left breast with reduction , dx invasive ductal ca, Grade 1 (pT1cpNX) ER/PR positive/  adjuvant radiation completed 03-27-2016 to right chest wall, 2018  reclassifiec Stage IIA  . Cancer, metastatic to liver ([place])   . Chronic back pain   . Depression   . Family history of adverse reaction to anesthesia     mother has Copd was kept on vent   . Headache   .  Heart murmur   . History of hyperthyroidism    s/p  RAI  . History of radiation therapy 02/11/16- 03/27/16   Right Chest Wall 50.4 Gy in 28 fractions, Right Chest Wall Boost 10 Gy in 5 fractions.   . Hyperlipidemia   . Hypertension   . Hypothyroidism, postradioiodine therapy    endocrinoloigst-  dr balan--  2004  s/p  RAI   . Menorrhagia 2011  . Multiple pulmonary nodules    secondary  metastatic  . PONV (postoperative nausea and vomiting)   . SUI (stress urinary incontinence, female)   . Wears contact lenses   . Wears dentures    full upper and lower partial    PAST SURGICAL HISTORY: Past Surgical History:  Procedure Laterality Date  . BREAST LUMPECTOMY WITH NEEDLE LOCALIZATION AND AXILLARY SENTINEL LYMPH NODE BX Right 11/28/2015   Procedure: RIGHT BREAST LUMPECTOMY WITH NEEDLE LOCALIZATION AND AXILLARY SENTINEL LYMPH NODE BX;  Surgeon: Autumn Messing III, MD;  Location: [place];  Service: General;  Laterality: Right;  . BREAST REDUCTION SURGERY Bilateral 11/28/2015   Procedure: MAMMARY REDUCTION  (BREAST) BILATERAL;  Surgeon: Crissie Reese, MD;  Location: [place];  Service: Plastics;  Laterality: Bilateral;  . CESAREAN SECTION  01/12/1993  . COLONOSCOPY    . HYSTEROSCOPY N/A 09/02/2017   Procedure: HYSTEROSCOPY  to remove IUD;  Surgeon: Eldred Manges, MD;  Location: [facility];  Service: Gynecology;  Laterality: N/A;  . MASTECTOMY W/ SENTINEL NODE BIOPSY Bilateral 12/26/2015   Procedure: BILATERAL MASTECTOMY WITH LEFT SENTINEL LYMPH NODE BIOPSY;  Surgeon: Autumn Messing III, MD;  Location: [place];  Service: General;  Laterality: Bilateral;  . TUBAL LIGATION Bilateral 1995    FAMILY HISTORY Family History  Problem Relation Age of Onset  . Diabetes Mother   . Hypertension Mother   . Hypertension Father   . Diabetes Maternal Grandmother   . Hypertension Sister   . Cancer Paternal Grandmother        colon  . Colon cancer Paternal Grandmother   . Esophageal cancer Neg Hx   . Stomach cancer Neg Hx   . Rectal cancer Neg Hx   The patient's parents are still living, in their early 78s as of June 2017. The patient has one brother, 2 sisters. On the maternal side there is a history of uterine and prostate cancer. On the paternal side there is a history of colon cancer and possibly uterine cancer. There  is no history of breast or ovarian cancer in the family.   GYNECOLOGIC HISTORY:  No LMP recorded. (Menstrual status: IUD).  Menarche age 52. She still having regular periods. She is GX P1, first pregnancy age 76. She used oral contraceptives for a few years remotely, with no complications. She is currently on a Mirena IUD and also is status post bilateral tubal ligation.  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 Her husband [name] is a Administrator. Their son [name] is a radio DJ. The patient has no grandchildren. She is not a Ambulance person.   ADVANCED DIRECTIVES: Not in place   HEALTH MAINTENANCE: Social History   Tobacco Use  . Smoking status: Never Smoker  . Smokeless tobacco: Never Used  Substance Use Topics  . Alcohol use: No  . Drug use: No     Colonoscopy: January 2016   PAP: January 2016   Bone density: August 2017 showed a T score of +0.7 normal  Lipid panel:  Allergies  Allergen Reactions  . Ace Inhibitors Cough  . Atorvastatin Other (See Comments)    Joint pain  . Simvastatin Other (See Comments)    Joint pain    Current Outpatient Medications  Medication Sig Dispense Refill  . acetaminophen (TYLENOL) 325 MG tablet Take 650 mg by mouth every 6 (six) hours as needed for mild pain. Reported on 10/23/2015    . anastrozole (ARIMIDEX) 1 MG tablet Take 1 tablet (1 mg total) by mouth daily. 90 tablet 4  . Calcium-Magnesium 250-125 MG TABS Take 1 tablet by mouth daily. 120 each 4  . Cetirizine HCl (ZYRTEC ALLERGY PO) Take 1 tablet by mouth daily as needed (allergies).     . everolimus (AFINITOR) 10 MG tablet Take 1 tablet (10 mg total) by mouth daily. 30 tablet 6  . hydrochlorothiazide (MICROZIDE) 12.5 MG capsule Take 12.5 mg by mouth every morning.     Marland Kitchen ibuprofen (ADVIL,MOTRIN) 600 MG tablet Take 1 tablet (600 mg total) by mouth every 6 (six) hours as needed for moderate pain. 60 tablet 0  . levothyroxine (SYNTHROID, LEVOTHROID) 137  MCG tablet Take 137 mcg by mouth daily before breakfast.    . LIVALO 2 MG TABS Take 0.2 mg by mouth daily.   6  . losartan (COZAAR) 100 MG tablet Take 100 mg by mouth every morning.     . Multiple Vitamin (MULTIVITAMIN) tablet Take 1 tablet by mouth daily.    Marland Kitchen omega-3 acid ethyl esters (LOVAZA) 1 g capsule Take 1 g by mouth 2 (two) times daily.     . potassium chloride SA (K-DUR,KLOR-CON) 20 MEQ tablet Take 40 mEq by mouth 2 (two) times daily.     . traMADol (ULTRAM) 50 MG tablet Take 1 tablet (50 mg total) by mouth every 6 (six) hours as needed. 60 tablet 3  . Vitamin D, Cholecalciferol, 1000 units CAPS Take 1 tablet by mouth daily. 90 capsule 4   No current facility-administered medications for this visit.     OBJECTIVE: Middle-aged African-American woman who appears stated age  52:   01/04/18 0859  BP: (!) 166/84  Pulse: 84  Resp: 20  Temp: 98.6 F (37 C)  SpO2: 97%     Body mass index is 42.42 kg/m.    ECOG FS:1 - Symptomatic but completely ambulatory  Filed Weights   01/04/18 0859  Weight: 262 lb 12.8 oz (119.2 kg)   Sclerae unicteric, EOMs intact Oropharynx clear and moist No cervical or supraclavicular adenopathy Lungs no rales or rhonchi Heart regular rate and rhythm Abd soft, nontender, positive bowel sounds MSK she complains of low back pain but has no focal spinal tenderness, pain left lower extremity on ambulation Neuro: nonfocal, well oriented, appropriate affect Breasts: Status post bilateral mastectomies.  No evidence of chest wall recurrence.  LAB RESULTS:  CMP     Component Value Date/Time   NA 140 01/04/2018 0810   NA 140 07/08/2016 0948   K 4.2 01/04/2018 0810   K 4.1 07/08/2016 0948   CL 106 01/04/2018 0810   CO2 24 01/04/2018 0810   CO2 24 07/08/2016 0948   GLUCOSE 97 01/04/2018 0810   GLUCOSE 89 07/08/2016 0948   BUN 11 01/04/2018 0810   BUN 11.9 07/08/2016 0948   CREATININE 1.00 01/04/2018 0810   CREATININE 0.90 06/22/2017 1147    CREATININE 0.9 07/08/2016 0948   CALCIUM 9.9 01/04/2018 0810   CALCIUM 10.0 07/08/2016 0948   PROT 7.8 01/04/2018 0810   PROT  7.6 07/08/2016 0948   ALBUMIN 3.8 01/04/2018 0810   ALBUMIN 3.6 07/08/2016 0948   AST 33 01/04/2018 0810   AST 31 06/22/2017 1147   AST 22 07/08/2016 0948   ALT 42 01/04/2018 0810   ALT 45 06/22/2017 1147   ALT 27 07/08/2016 0948   ALKPHOS 121 01/04/2018 0810   ALKPHOS 115 07/08/2016 0948   BILITOT 0.3 01/04/2018 0810   BILITOT 0.4 06/22/2017 1147   BILITOT 0.35 07/08/2016 0948   GFRNONAA >60 01/04/2018 0810   GFRNONAA >60 06/22/2017 1147   GFRAA >60 01/04/2018 0810   GFRAA >60 06/22/2017 1147    INo results found for: SPEP, UPEP  Lab Results  Component Value Date   WBC 3.4 (L) 01/04/2018   NEUTROABS 1.9 01/04/2018   HGB 13.6 01/04/2018   HCT 39.3 01/04/2018   MCV 102.3 (H) 01/04/2018   PLT 218 01/04/2018      Chemistry      Component Value Date/Time   NA 140 01/04/2018 0810   NA 140 07/08/2016 0948   K 4.2 01/04/2018 0810   K 4.1 07/08/2016 0948   CL 106 01/04/2018 0810   CO2 24 01/04/2018 0810   CO2 24 07/08/2016 0948   BUN 11 01/04/2018 0810   BUN 11.9 07/08/2016 0948   CREATININE 1.00 01/04/2018 0810   CREATININE 0.90 06/22/2017 1147   CREATININE 0.9 07/08/2016 0948      Component Value Date/Time   CALCIUM 9.9 01/04/2018 0810   CALCIUM 10.0 07/08/2016 0948   ALKPHOS 121 01/04/2018 0810   ALKPHOS 115 07/08/2016 0948   AST 33 01/04/2018 0810   AST 31 06/22/2017 1147   AST 22 07/08/2016 0948   ALT 42 01/04/2018 0810   ALT 45 06/22/2017 1147   ALT 27 07/08/2016 0948   BILITOT 0.3 01/04/2018 0810   BILITOT 0.4 06/22/2017 1147   BILITOT 0.35 07/08/2016 0948       No results found for: LABCA2  No components found for: [id number]  No results for input(s): INR in the last 168 hours.  Urinalysis No results found for: COLORURINE, APPEARANCEUR, LABSPEC, PHURINE, GLUCOSEU, HGBUR, BILIRUBINUR, KETONESUR, PROTEINUR, UROBILINOGEN,  NITRITE, LEUKOCYTESUR   STUDIES: Nm Bone Scan Whole Body  Result Date: 12/25/2017 CLINICAL DATA:  52 year old female for restaging of stage IV breast cancer with hepatic, bone and pulmonary metastases. Undergoing chemotherapy. EXAM: NUCLEAR MEDICINE WHOLE BODY BONE SCAN TECHNIQUE: Whole body anterior and posterior images were obtained approximately 3 hours after intravenous injection of radiopharmaceutical. RADIOPHARMACEUTICALS:  21.0 mCi Technetium-26mMDP IV COMPARISON:  08/13/2017 CT, 08/12/2017 MR and prior studies. No prior bone scans available for comparison. FINDINGS: The same spine numbering system will be utilized as on the 08/12/2017 MR. Abnormal increased and heterogeneous activity throughout the thoracic spine, lumbar spine and bony pelvis noted compatible with metastatic disease, greatest within T11, L2, RIGHT aspect of S1 and RIGHT ischium/acetabulum. Mild increased activity/metastases within numerous ribs also noted. A moderate to large area of increased activity/metastasis within the proximal LEFT femur is noted. Degenerative changes within the knees and shoulders are present. IMPRESSION: Diffuse bony metastases as described. Most conspicuous metastases are located within T11, L2, S1, RIGHT ischium/acetabulum and proximal LEFT femur. Electronically Signed   By: JMargarette CanadaM.D.   On: 12/25/2017 12:24   Mr Liver W Wo Contrast  Result Date: 12/28/2017 CLINICAL DATA:  Stage IV right breast cancer with liver, bone and lung metastases. Ongoing chemotherapy. Restaging. EXAM: MRI ABDOMEN WITHOUT AND WITH CONTRAST TECHNIQUE: Multiplanar multisequence  MR imaging of the abdomen was performed both before and after the administration of intravenous contrast. CONTRAST:  18m MULTIHANCE GADOBENATE DIMEGLUMINE 529 MG/ML IV SOLN COMPARISON:  08/11/2017 MRI abdomen. FINDINGS: Significantly motion degraded scan, limiting assessment, particularly on the postcontrast images. Lower chest: Clustered small  pulmonary nodules at the left lung base measuring up to 5 mm (series 3/image 16) are stable. No acute abnormality at the lung bases. Hepatobiliary: Mild hepatomegaly. Severe diffuse hepatic steatosis on chemical shift imaging. Numerous (greater than 30) similar enhancing liver masses scattered throughout the liver, all mildly increased in size. Representative liver masses as follows: -peripheral right liver lobe 2.2 x 1.8 cm mass (series 3/image 16), mildly increased from 1.8 x 1.7 cm on 08/11/2017 MRI -segment 8 right liver lobe 1.2 x 0.9 cm mass (series 3/image 21), not appreciably changed from 1.1 x 0.9 cm -posterior inferior right liver lobe 2.4 x 1.7 cm mass (series 3/image 29), increased from 1.4 x 1.3 cm Normal gallbladder with no cholelithiasis. No biliary ductal dilatation. Common bile duct diameter 3 mm. No choledocholithiasis. Pancreas: No pancreatic mass or duct dilation.  No pancreas divisum. Spleen: Normal size. No mass. Adrenals/Urinary Tract: Normal adrenals. No hydronephrosis. Stable small parapelvic renal cysts in the left kidney. No suspicious renal masses. Normal kidneys with no renal mass. Stomach/Bowel: Small hiatal hernia. Otherwise normal nondistended stomach. Visualized small and large bowel is normal caliber, with no bowel wall thickening. Vascular/Lymphatic: Normal caliber abdominal aorta. Patent portal, splenic, hepatic and renal veins. No pathologically enlarged lymph nodes in the abdomen. Other: No abdominal ascites or focal fluid collection. Musculoskeletal: Extensive enhancing patchy confluent osseous lesions throughout the visualized thoracolumbar spine, not appreciably changed. IMPRESSION: 1. Widespread liver metastases have mildly increased. 2. Tiny clustered pulmonary nodules at the left lung base are stable. 3. Widespread patchy confluent osseous metastases throughout the visualized spine, not appreciably changed. 4. Prominent diffuse hepatic steatosis. Electronically Signed    By: JIlona SorrelM.D.   On: 12/28/2017 08:37    ELIGIBLE FOR AVAILABLE RESEARCH PROTOCOL: PALLAS, but inelegible because of Mirena IUD  ASSESSMENT: 89y.o. [place] woman status post right breast upper inner quadrant biopsy 10/15/2015 for a clinical T2-T3 N0, stage II invasive ductal carcinoma, estrogen and progesterone receptor positive, HER-2 nonamplified, with an Mib-1 of 70%  (1) status post right lumpectomy and sentinel lymph node sampling with oncoplastic breast reduction 11/28/2015 for a pT2 pN1, stage IIB invasive ductal carcinoma, grade 1, with negative margins (1/1 sentinel node positive)  (a) reclassified as stage IIA in the 2018 prognostic revision   (2) left reduction mammoplasty 11/28/2015 unexpectedly found a pT1c pNX invasive ductal carcinoma, grade 1, estrogen and progesterone receptor positive, HER-2 not amplified, with an MIB-1 of 3%  (3) Mammaprint from the right sided breast cancer was "low risk", predicting a 98% chance of no disease recurrence within 5 years with anti-estrogens as the only systemic therapy. It also predicts minimal to no benefit from chemotherapy  (4) status post bilateral mastectomies with bilateral sentinel lymph node sampling 12/26/2015, showing  (a) on the right, no residual carcinoma (0/3 nodes involved)  (b) on the left, focal ductal carcinoma in situ, 0.2 cm, with negative margins; (0/4 nodes involved)  (5) adjuvant radiation 02/11/16 - 03/27/16    1) Right chest wall: 50.4 Gy in 28 fractions               2) Right chest wall boost: 10 Gy in 5 fractions   (6) started on tamoxifen  neoadjuvantly 10/23/2015 to allow for expected delays in definitive local treatment  (a) Mirena IUD in place  (b) tamoxifen discontinued 08/13/2017, with progression  (7) since she had 4 lymph nodes removed from each axilla but also receive radiation on the right, lab draws should preferentially be obtained from the left arm   METASTATIC DISEASE: April 2019 (8) CT  scan of the abdomen and pelvis 08/04/2017, MRI of the liver and total spine MRI 08/11/2017 and CT scan of the chest and brain on 08/13/2017 showed liver and bone metastases  (a) liver biopsy 09/07/2017 confirms estrogen receptor positive, progesterone receptor negative, HER-2 negative metastatic breast cancer  (b) chest CT scan 08/13/2017 shows multiple bilateral pulmonary nodules.  (c) CT of the brain with and without contrast 08/26/2017 shows no evidence of metastatic disease to the brain  (d) CA 27-29 is informative  (9) fulvestrant started 08/17/2017  (a) palbociclib started 08/31/2017  (b) discontinued 01/04/2018 with evidence of progression in the liver  (10) denosumab/Xgeva started 09/14/2017  (11) anastrozole started 01/04/2018  (a) everolimus to be added at 10 mg daily starting as soon as available  PLAN: I spent approximately 30 minutes face to face with Debbie [name] with more than 50% of that time spent in counseling and coordination of care.  We reviewed her bone scan and liver MRI.  The concern in the bone scan is the left femur area and that also is an area where she is very symptomatic.  I am afraid she might have a fracture if we do not attend to this.  I am writing her for a cane to take a little bit of the weight off that leg I am also referring her back to radiation oncology for adjuvant radiation to that area at their discretion  We also need to discontinue the current treatment since it is not controlling the tumor.  Whenever we have stage IV disease and a patient's tumor grows through the treatment then the choice is whether to go to a different antiestrogen if there is another option or go to chemotherapy.  In this case we do have a second antiestrogen option namely anastrozole and Afinitor.  We discussed the possible toxicities, side effects and complications of this combination.  In particular she was alerted to the risk of pneumonitis.  She already has a mild occasional dry  cough.  If she develops a more persistent dry cough she will let us know.  I have put in the orders and she should start the anastrozole now and the Afinitor as soon as it is available.  She will stop the [place] today and we are stopping the fulvestrant as well.  We are going to continue the Xgeva on a monthly basis  She is going to return to see me on 01/11/2018 to discuss tolerance.  Hopefully we can get through 2 to 3 months of these treatments and then repeat a liver MRI.  If there has again been evidence of progression we will go to paclitaxel.  She knows to call for any other issues that may develop before the next visit.   [name], Virgie Dad, MD  01/04/18 9:28 AM Medical Oncology and Hematology [facility] [address] [place], [place] [address] Tel. [phone]    Fax. [phone]  Alice Rieger, am acting as scribe for Chauncey Cruel MD.  I, Lurline Del MD, have reviewed the above documentation for accuracy and completeness, and I agree with the above.

## 2018-01-04 ENCOUNTER — Ambulatory Visit: Payer: 59

## 2018-01-04 ENCOUNTER — Inpatient Hospital Stay: Payer: 59 | Attending: Oncology | Admitting: Oncology

## 2018-01-04 ENCOUNTER — Telehealth: Payer: Self-pay | Admitting: Pharmacist

## 2018-01-04 ENCOUNTER — Inpatient Hospital Stay: Payer: 59

## 2018-01-04 VITALS — BP 166/84 | HR 84 | Temp 98.6°F | Resp 20 | Ht 66.0 in | Wt 262.8 lb

## 2018-01-04 DIAGNOSIS — C50011 Malignant neoplasm of nipple and areola, right female breast: Secondary | ICD-10-CM

## 2018-01-04 DIAGNOSIS — C50211 Malignant neoplasm of upper-inner quadrant of right female breast: Secondary | ICD-10-CM

## 2018-01-04 DIAGNOSIS — I10 Essential (primary) hypertension: Secondary | ICD-10-CM | POA: Diagnosis not present

## 2018-01-04 DIAGNOSIS — Z79899 Other long term (current) drug therapy: Secondary | ICD-10-CM | POA: Diagnosis not present

## 2018-01-04 DIAGNOSIS — F419 Anxiety disorder, unspecified: Secondary | ICD-10-CM | POA: Insufficient documentation

## 2018-01-04 DIAGNOSIS — Z170 Estrogen receptor positive status [ER+]: Secondary | ICD-10-CM

## 2018-01-04 DIAGNOSIS — C787 Secondary malignant neoplasm of liver and intrahepatic bile duct: Secondary | ICD-10-CM | POA: Insufficient documentation

## 2018-01-04 DIAGNOSIS — Z79810 Long term (current) use of selective estrogen receptor modulators (SERMs): Secondary | ICD-10-CM | POA: Insufficient documentation

## 2018-01-04 DIAGNOSIS — C7951 Secondary malignant neoplasm of bone: Secondary | ICD-10-CM | POA: Insufficient documentation

## 2018-01-04 DIAGNOSIS — Z9013 Acquired absence of bilateral breasts and nipples: Secondary | ICD-10-CM | POA: Insufficient documentation

## 2018-01-04 DIAGNOSIS — Z923 Personal history of irradiation: Secondary | ICD-10-CM | POA: Insufficient documentation

## 2018-01-04 DIAGNOSIS — E039 Hypothyroidism, unspecified: Secondary | ICD-10-CM | POA: Insufficient documentation

## 2018-01-04 DIAGNOSIS — F329 Major depressive disorder, single episode, unspecified: Secondary | ICD-10-CM | POA: Diagnosis not present

## 2018-01-04 DIAGNOSIS — R918 Other nonspecific abnormal finding of lung field: Secondary | ICD-10-CM | POA: Diagnosis not present

## 2018-01-04 DIAGNOSIS — C50112 Malignant neoplasm of central portion of left female breast: Secondary | ICD-10-CM | POA: Diagnosis not present

## 2018-01-04 DIAGNOSIS — Z975 Presence of (intrauterine) contraceptive device: Secondary | ICD-10-CM | POA: Insufficient documentation

## 2018-01-04 DIAGNOSIS — Z79818 Long term (current) use of other agents affecting estrogen receptors and estrogen levels: Secondary | ICD-10-CM | POA: Insufficient documentation

## 2018-01-04 DIAGNOSIS — E785 Hyperlipidemia, unspecified: Secondary | ICD-10-CM | POA: Diagnosis not present

## 2018-01-04 DIAGNOSIS — C50012 Malignant neoplasm of nipple and areola, left female breast: Secondary | ICD-10-CM

## 2018-01-04 LAB — COMPREHENSIVE METABOLIC PANEL
ALT: 42 U/L (ref 0–44)
AST: 33 U/L (ref 15–41)
Albumin: 3.8 g/dL (ref 3.5–5.0)
Alkaline Phosphatase: 121 U/L (ref 38–126)
Anion gap: 10 (ref 5–15)
BUN: 11 mg/dL (ref 6–20)
CO2: 24 mmol/L (ref 22–32)
Calcium: 9.9 mg/dL (ref 8.9–10.3)
Chloride: 106 mmol/L (ref 98–111)
Creatinine, Ser: 1 mg/dL (ref 0.44–1.00)
GFR calc Af Amer: >60 mL/min (ref >60)
GFR calc non Af Amer: >60 mL/min (ref >60)
Glucose, Bld: 97 mg/dL (ref 70–99)
Potassium: 4.2 mmol/L (ref 3.5–5.1)
Sodium: 140 mmol/L (ref 135–145)
Total Bilirubin: 0.3 mg/dL (ref 0.3–1.2)
Total Protein: 7.8 g/dL (ref 6.5–8.1)

## 2018-01-04 LAB — CBC WITH DIFFERENTIAL/PLATELET
Basophils Absolute: 0 10*3/uL (ref 0.0–0.1)
Basophils Relative: 1 %
Eosinophils Absolute: 0.1 10*3/uL (ref 0.0–0.5)
Eosinophils Relative: 4 %
HCT: 39.3 % (ref 34.8–46.6)
Hemoglobin: 13.6 g/dL (ref 11.6–15.9)
Lymphocytes Relative: 34 %
Lymphs Abs: 1.2 10*3/uL (ref 0.9–3.3)
MCH: 35.4 pg — ABNORMAL HIGH (ref 25.1–34.0)
MCHC: 34.6 g/dL (ref 31.5–36.0)
MCV: 102.3 fL — ABNORMAL HIGH (ref 79.5–101.0)
Monocytes Absolute: 0.2 10*3/uL (ref 0.1–0.9)
Monocytes Relative: 5 %
Neutro Abs: 1.9 10*3/uL (ref 1.5–6.5)
Neutrophils Relative %: 56 %
Platelets: 218 10*3/uL (ref 145–400)
RBC: 3.84 MIL/uL (ref 3.70–5.45)
RDW: 14.7 % — ABNORMAL HIGH (ref 11.2–14.5)
WBC: 3.4 10*3/uL — ABNORMAL LOW (ref 3.9–10.3)

## 2018-01-04 MED ORDER — EVEROLIMUS 10 MG PO TABS: 10.0000 mg | ORAL_TABLET | Freq: Every day | ORAL | 6 refills | Status: DC

## 2018-01-04 MED ORDER — DENOSUMAB 120 MG/1.7ML ~~LOC~~ SOLN
120.0000 mg | Freq: Once | SUBCUTANEOUS | Status: AC
  Administered 2018-01-04: 120 mg via SUBCUTANEOUS

## 2018-01-04 MED ORDER — DEXAMETHASONE 0.5 MG/5ML PO SOLN
ORAL | 4 refills | Status: DC
Start: 2018-01-04 — End: 2018-06-09

## 2018-01-04 MED ORDER — ANASTROZOLE 1 MG PO TABS: 1.0000 mg | ORAL_TABLET | Freq: Every day | ORAL | 4 refills | Status: DC

## 2018-01-04 MED ORDER — FULVESTRANT 250 MG/5ML IM SOLN
INTRAMUSCULAR | Status: AC
  Filled 2018-01-04: qty 10

## 2018-01-04 MED ORDER — DENOSUMAB 120 MG/1.7ML ~~LOC~~ SOLN
SUBCUTANEOUS | Status: AC
  Filled 2018-01-04: qty 1.7

## 2018-01-04 MED FILL — ANASTROZOLE 1 MG TABLET: 1 | 90 days supply | Qty: 90 | Fill #0

## 2018-01-04 MED FILL — LOSARTAN POTASSIUM 100 MG T: 100 | 90 days supply | Qty: 90 | Fill #0

## 2018-01-04 NOTE — Patient Instructions (Signed)

## 2018-01-04 NOTE — Telephone Encounter (Signed)
Oral Oncology Pharmacist Encounter  Received notification from MD that treatment will be changed to Afinitor (everolimus) plus anastrazole for the treatment of metastatic, hormone receptor positive breast cancer, planned duration until disease progression or unacceptable toxicity.  Labs from 01/04/18 assessed, OK for treatment. No lipid panel in Epic to assess  Current medication list in Epic reviewed, no DDIs with Afinitor identified.  Prescription has been e-scribed to the [facility] for benefits analysis and approval. Dexamethasone mouthwash prescription has also been sent to pharmacy for prevention of stomatitis.  Oral Oncology Clinic will continue to follow for insurance authorization, copayment issues, initial counseling and start date.  Johny Drilling, PharmD, BCPS, BCOP  01/04/2018 10:11 AM Oral Oncology Clinic [phone]

## 2018-01-04 NOTE — Telephone Encounter (Signed)
Oral Chemotherapy Pharmacist Encounter   I spoke with patient for overview of: Afinitor (everolimus) for the treatment of metastatic, hormone-receptor positive breast cancer in conjunction with anastrozole, planned duration until disease progression or unacceptable toxicity.   Counseled patient on administration, dosing, side effects, monitoring, drug-food interactions, safe handling, storage, and disposal.  Patient will take Afinitor 10mg  tablets, 1 tablet by mouth once daily, with water, without regard to food.  Patient understands to take Afinitor consistently with regards to food and at approximately the same time each day.  Patient knows to aviod grapefruit or grapefruit juice while on therapy with Afinitor.  Afinitor start date: TBD, pending insurance authorization  Adverse effects include but are not limited to: mouth sores, GI upset, nausea, diarrhea, constipation, rash, increased blood sugars, decreased blood counts, increased blood pressure, and edema.   Dexamethasone mouthwash for the prevention of stomatitis has been e-scribed to local pharmacy.  We discussed appropriate use of mouthwash and duration of stomatitis prevention.  Reviewed with patient importance of keeping a medication schedule and plan for any missed doses.  Ms. Leiva voiced understanding and appreciation.   All questions answered. Medication reconciliation performed and medication/allergy list updated.  Will follow up with patient for medication acquisition once insurance authorization is approved. Patient should be eligible for manufacturer copayment coupon if copayment returns unaffordable.  Patient knows to call the office with questions or concerns. Oral Oncology Clinic will continue to follow.  Johny Drilling, PharmD, BCPS, BCOP  01/04/2018 2:19 PM Oral Oncology Clinic [phone]

## 2018-01-05 ENCOUNTER — Encounter: Payer: Self-pay | Admitting: Radiation Oncology

## 2018-01-05 LAB — CANCER ANTIGEN 27.29: CA 27.29: 391.7 U/mL — ABNORMAL HIGH (ref 0.0–38.6)

## 2018-01-06 NOTE — Progress Notes (Signed)
Debbie [name]  [phone] [phone]     ID: [name] DOB: 03-Feb-1966  MR#: [id number]  JYN#:[id number]  Patient Care Team: Kelton Pillar, MD as PCP - General (Family Medicine) Jacelyn Pi, MD as Consulting Physician (Endocrinology) Leo Grosser Seymour Bars, MD as Consulting Physician (Obstetrics and Gynecology) Jovita Kussmaul, MD as Consulting Physician (General Surgery) Siboney Requejo, Virgie Dad, MD as Consulting Physician (Oncology) [name], MD as Attending Physician (Radiation Oncology) Belva Crome, MD as Consulting Physician (Cardiology) Crissie Reese, MD as Consulting Physician (Plastic Surgery) [name], [name], NP as Nurse Practitioner (Hematology and Oncology) PCP: Kelton Pillar, MD OTHER MD:  CHIEF COMPLAINT: Estrogen receptor positive stage IV  breast cancer  CURRENT TREATMENT:  Anastrozole, affinitor; denosumab/Xgeva  INTERVAL HISTORY: Debbie [name] returns today for follow-up of her estrogen receptor positive stage IV breast cancer. She was started on anastrozole 01/04/2018. She tolerates this well so far. She has some hot flashes. She had noticed some indigestion after eating. She has increased burping after almost every meal. Her appetite is decreased. Her sense of taste is unchanged and there has been no sudden change in her weight. She has also had nausea, but no vomiting.   She was also prescribed everolimus, but she notes that it is not available to pick up from her pharmacy yet.   She receives denosumab/Xgeva every 4 weeks with her most recent dose on 01/04/2018.  She is meeting with Dr. [name] on 01/18/2018 to discuss radiation.    REVIEW OF SYSTEMS: [name] continues to have pain in her hip and difficulty walking. She denies unusual headaches, visual changes, nausea, vomiting, or dizziness. There has been no unusual cough, phlegm production, or pleurisy. There has been no change in bowel or bladder habits. She  denies unexplained fatigue or unexplained weight loss, bleeding, rash, or fever. A detailed review of systems was otherwise stable.    PAST MEDICAL HISTORY: Past Medical History:  Diagnosis Date  . Anxiety   . Bone metastasis ([place])   . Breast cancer of upper-inner quadrant of right female breast [facility]) oncologist-  dr [name]--- 04/ 2019 ER/PR+ Stage IV w/ metastatic disease to liver, total spine, lung, bone   dx 06/ 2017  right breast invasive ductal carcinoma, Stage IIB, ER/PR + (pT2 pN1), grade 1-- s/p  right breast lumpecotmy w/ sln bx's and bilateral breast reduction 11-28-2015/  left breast with reduction , dx invasive ductal ca, Grade 1 (pT1cpNX) ER/PR positive/  adjuvant radiation completed 03-27-2016 to right chest wall, 2018  reclassifiec Stage IIA  . Cancer, metastatic to liver ([place])   . Chronic back pain   . Depression   . Family history of adverse reaction to anesthesia     mother has Copd was kept on vent   . Headache   . Heart murmur   . History of hyperthyroidism    s/p  RAI  . History of radiation therapy 02/11/16- 03/27/16   Right Chest Wall 50.4 Gy in 28 fractions, Right Chest Wall Boost 10 Gy in 5 fractions.   . Hyperlipidemia   . Hypertension   . Hypothyroidism, postradioiodine therapy    endocrinoloigst-  dr balan--  2004  s/p  RAI  . Menorrhagia 2011  . Multiple pulmonary nodules    secondary  metastatic  . PONV (postoperative nausea and vomiting)   . SUI (stress urinary incontinence, female)   . Wears contact lenses   . Wears dentures    full upper and lower partial  PAST SURGICAL HISTORY: Past Surgical History:  Procedure Laterality Date  . BREAST LUMPECTOMY WITH NEEDLE LOCALIZATION AND AXILLARY SENTINEL LYMPH NODE BX Right 11/28/2015   Procedure: RIGHT BREAST LUMPECTOMY WITH NEEDLE LOCALIZATION AND AXILLARY SENTINEL LYMPH NODE BX;  Surgeon: Autumn Messing III, MD;  Location: [facility];  Service: General;  Laterality: Right;  . BREAST REDUCTION SURGERY  Bilateral 11/28/2015   Procedure: MAMMARY REDUCTION  (BREAST) BILATERAL;  Surgeon: Crissie Reese, MD;  Location: [place];  Service: Plastics;  Laterality: Bilateral;  . CESAREAN SECTION  01/12/1993  . COLONOSCOPY    . HYSTEROSCOPY N/A 09/02/2017   Procedure: HYSTEROSCOPY  to remove IUD;  Surgeon: Eldred Manges, MD;  Location: [facility];  Service: Gynecology;  Laterality: N/A;  . MASTECTOMY W/ SENTINEL NODE BIOPSY Bilateral 12/26/2015   Procedure: BILATERAL MASTECTOMY WITH LEFT SENTINEL LYMPH NODE BIOPSY;  Surgeon: Autumn Messing III, MD;  Location: [place];  Service: General;  Laterality: Bilateral;  . TUBAL LIGATION Bilateral 1995    FAMILY HISTORY Family History  Problem Relation Age of Onset  . Diabetes Mother   . Hypertension Mother   . Hypertension Father   . Diabetes Maternal Grandmother   . Hypertension Sister   . Cancer Paternal Grandmother        colon  . Colon cancer Paternal Grandmother   . Esophageal cancer Neg Hx   . Stomach cancer Neg Hx   . Rectal cancer Neg Hx   The patient's parents are still living, in their early 54s as of June 2017. The patient has one brother, 2 sisters. On the maternal side there is a history of uterine and prostate cancer. On the paternal side there is a history of colon cancer and possibly uterine cancer. There is no history of breast or ovarian cancer in the family.   GYNECOLOGIC HISTORY:  No LMP recorded. (Menstrual status: IUD).  Menarche age 50. She still having regular periods. She is GX P1, first pregnancy age 17. She used oral contraceptives for a few years remotely, with no complications. She is currently on a Mirena IUD and also is status post bilateral tubal ligation.  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 Her husband [name] is a Administrator. Their son [name] is a radio DJ. The patient has no grandchildren. She is not a Ambulance person.   ADVANCED DIRECTIVES: Not in place   HEALTH  MAINTENANCE: Social History   Tobacco Use  . Smoking status: Never Smoker  . Smokeless tobacco: Never Used  Substance Use Topics  . Alcohol use: No  . Drug use: No     Colonoscopy: January 2016   PAP: January 2016   Bone density: August 2017 showed a T score of +0.7 normal  Lipid panel:  Allergies  Allergen Reactions  . Ace Inhibitors Cough  . Atorvastatin Other (See Comments)    Joint pain  . Simvastatin Other (See Comments)    Joint pain    Current Outpatient Medications  Medication Sig Dispense Refill  . acetaminophen (TYLENOL) 325 MG tablet Take 650 mg by mouth every 6 (six) hours as needed for mild pain. Reported on 10/23/2015    . anastrozole (ARIMIDEX) 1 MG tablet Take 1 tablet (1 mg total) by mouth daily. 90 tablet 4  . Calcium-Magnesium 250-125 MG TABS Take 1 tablet by mouth daily. 120 each 4  . Cetirizine HCl (ZYRTEC ALLERGY PO) Take 1 tablet by mouth daily as needed (allergies).     Marland Kitchen  dexamethasone (DECADRON) 0.5 MG/5ML solution Swish 7m in mouth for 234m and spit out. Use 4 times daily for 8 weeks, start with Afinitor. Avoid eating/drinking for 1hr after rinse. 500 mL 4  . everolimus (AFINITOR) 10 MG tablet Take 1 tablet (10 mg total) by mouth daily. Take at approximately the same time each day with a glass of water. 28 tablet 6  . hydrochlorothiazide (MICROZIDE) 12.5 MG capsule Take 12.5 mg by mouth every morning.     . Marland Kitchenbuprofen (ADVIL,MOTRIN) 600 MG tablet Take 1 tablet (600 mg total) by mouth every 6 (six) hours as needed for moderate pain. 60 tablet 0  . levothyroxine (SYNTHROID, LEVOTHROID) 137 MCG tablet Take 137 mcg by mouth daily before breakfast.    . LIVALO 2 MG TABS Take 0.2 mg by mouth daily.   6  . losartan (COZAAR) 100 MG tablet Take 100 mg by mouth every morning.     . Multiple Vitamin (MULTIVITAMIN) tablet Take 1 tablet by mouth daily.    . Marland Kitchenmega-3 acid ethyl esters (LOVAZA) 1 g capsule Take 1 g by mouth 2 (two) times daily.     . potassium  chloride SA (K-DUR,KLOR-CON) 20 MEQ tablet Take 40 mEq by mouth 2 (two) times daily.     . traMADol (ULTRAM) 50 MG tablet Take 1 tablet (50 mg total) by mouth every 6 (six) hours as needed. 60 tablet 3  . Vitamin D, Cholecalciferol, 1000 units CAPS Take 1 tablet by mouth daily. 90 capsule 4   No current facility-administered medications for this visit.     OBJECTIVE: Middle-aged African-American woman using a cane  Vitals:   01/11/18 1028  BP: (!) 157/90  Pulse: 79  Resp: 19  Temp: 98.4 F (36.9 C)     Body mass index is 42.21 kg/m.    ECOG FS:2 - Symptomatic, <50% confined to bed  Filed Weights   01/11/18 1028  Weight: 261 lb 8 oz (118.6 kg)   Sclerae unicteric, pupils round and equal Oropharynx clear and moist No cervical or supraclavicular adenopathy Lungs no rales or rhonchi Heart regular rate and rhythm Abd soft, nontender, positive bowel sounds MSK no focal spinal tenderness, walks with a significant limp Neuro: nonfocal, well oriented, appropriate affect Breasts: Status post bilateral mastectomies.  There is no evidence of chest wall recurrence.  Both axillae are benign.  LAB RESULTS:  CMP     Component Value Date/Time   NA 140 01/11/2018 0950   NA 140 07/08/2016 0948   K 4.2 01/11/2018 0950   K 4.1 07/08/2016 0948   CL 106 01/11/2018 0950   CO2 25 01/11/2018 0950   CO2 24 07/08/2016 0948   GLUCOSE 89 01/11/2018 0950   GLUCOSE 89 07/08/2016 0948   BUN 9 01/11/2018 0950   BUN 11.9 07/08/2016 0948   CREATININE 0.83 01/11/2018 0950   CREATININE 0.90 06/22/2017 1147   CREATININE 0.9 07/08/2016 0948   CALCIUM 9.4 01/11/2018 0950   CALCIUM 10.0 07/08/2016 0948   PROT 7.5 01/11/2018 0950   PROT 7.6 07/08/2016 0948   ALBUMIN 3.8 01/11/2018 0950   ALBUMIN 3.6 07/08/2016 0948   AST 34 01/11/2018 0950   AST 31 06/22/2017 1147   AST 22 07/08/2016 0948   ALT 43 01/11/2018 0950   ALT 45 06/22/2017 1147   ALT 27 07/08/2016 0948   ALKPHOS 117 01/11/2018 0950    ALKPHOS 115 07/08/2016 0948   BILITOT 0.4 01/11/2018 0950   BILITOT 0.4 06/22/2017 1147   BILITOT  0.35 07/08/2016 0948   GFRNONAA >60 01/11/2018 0950   GFRNONAA >60 06/22/2017 1147   GFRAA >60 01/11/2018 0950   GFRAA >60 06/22/2017 1147    INo results found for: SPEP, UPEP  Lab Results  Component Value Date   WBC 3.3 (L) 01/11/2018   NEUTROABS 1.9 01/11/2018   HGB 13.0 01/11/2018   HCT 38.0 01/11/2018   MCV 102.2 (H) 01/11/2018   PLT 207 01/11/2018      Chemistry      Component Value Date/Time   NA 140 01/11/2018 0950   NA 140 07/08/2016 0948   K 4.2 01/11/2018 0950   K 4.1 07/08/2016 0948   CL 106 01/11/2018 0950   CO2 25 01/11/2018 0950   CO2 24 07/08/2016 0948   BUN 9 01/11/2018 0950   BUN 11.9 07/08/2016 0948   CREATININE 0.83 01/11/2018 0950   CREATININE 0.90 06/22/2017 1147   CREATININE 0.9 07/08/2016 0948      Component Value Date/Time   CALCIUM 9.4 01/11/2018 0950   CALCIUM 10.0 07/08/2016 0948   ALKPHOS 117 01/11/2018 0950   ALKPHOS 115 07/08/2016 0948   AST 34 01/11/2018 0950   AST 31 06/22/2017 1147   AST 22 07/08/2016 0948   ALT 43 01/11/2018 0950   ALT 45 06/22/2017 1147   ALT 27 07/08/2016 0948   BILITOT 0.4 01/11/2018 0950   BILITOT 0.4 06/22/2017 1147   BILITOT 0.35 07/08/2016 0948       No results found for: LABCA2  No components found for: QBHAL937  No results for input(s): INR in the last 168 hours.  Urinalysis No results found for: COLORURINE, APPEARANCEUR, LABSPEC, PHURINE, GLUCOSEU, HGBUR, BILIRUBINUR, KETONESUR, PROTEINUR, UROBILINOGEN, NITRITE, LEUKOCYTESUR   STUDIES: Nm Bone Scan Whole Body  Result Date: 12/25/2017 CLINICAL DATA:  52 year old female for restaging of stage IV breast cancer with hepatic, bone and pulmonary metastases. Undergoing chemotherapy. EXAM: NUCLEAR MEDICINE WHOLE BODY BONE SCAN TECHNIQUE: Whole body anterior and posterior images were obtained approximately 3 hours after intravenous injection of  radiopharmaceutical. RADIOPHARMACEUTICALS:  21.0 mCi Technetium-45mMDP IV COMPARISON:  08/13/2017 CT, 08/12/2017 MR and prior studies. No prior bone scans available for comparison. FINDINGS: The same spine numbering system will be utilized as on the 08/12/2017 MR. Abnormal increased and heterogeneous activity throughout the thoracic spine, lumbar spine and bony pelvis noted compatible with metastatic disease, greatest within T11, L2, RIGHT aspect of S1 and RIGHT ischium/acetabulum. Mild increased activity/metastases within numerous ribs also noted. A moderate to large area of increased activity/metastasis within the proximal LEFT femur is noted. Degenerative changes within the knees and shoulders are present. IMPRESSION: Diffuse bony metastases as described. Most conspicuous metastases are located within T11, L2, S1, RIGHT ischium/acetabulum and proximal LEFT femur. Electronically Signed   By: JMargarette CanadaM.D.   On: 12/25/2017 12:24   Mr Liver W Wo Contrast  Result Date: 12/28/2017 CLINICAL DATA:  Stage IV right breast cancer with liver, bone and lung metastases. Ongoing chemotherapy. Restaging. EXAM: MRI ABDOMEN WITHOUT AND WITH CONTRAST TECHNIQUE: Multiplanar multisequence MR imaging of the abdomen was performed both before and after the administration of intravenous contrast. CONTRAST:  224mMULTIHANCE GADOBENATE DIMEGLUMINE 529 MG/ML IV SOLN COMPARISON:  08/11/2017 MRI abdomen. FINDINGS: Significantly motion degraded scan, limiting assessment, particularly on the postcontrast images. Lower chest: Clustered small pulmonary nodules at the left lung base measuring up to 5 mm (series 3/image 16) are stable. No acute abnormality at the lung bases. Hepatobiliary: Mild hepatomegaly. Severe diffuse hepatic steatosis  on chemical shift imaging. Numerous (greater than 30) similar enhancing liver masses scattered throughout the liver, all mildly increased in size. Representative liver masses as follows: -peripheral  right liver lobe 2.2 x 1.8 cm mass (series 3/image 16), mildly increased from 1.8 x 1.7 cm on 08/11/2017 MRI -segment 8 right liver lobe 1.2 x 0.9 cm mass (series 3/image 21), not appreciably changed from 1.1 x 0.9 cm -posterior inferior right liver lobe 2.4 x 1.7 cm mass (series 3/image 29), increased from 1.4 x 1.3 cm Normal gallbladder with no cholelithiasis. No biliary ductal dilatation. Common bile duct diameter 3 mm. No choledocholithiasis. Pancreas: No pancreatic mass or duct dilation.  No pancreas divisum. Spleen: Normal size. No mass. Adrenals/Urinary Tract: Normal adrenals. No hydronephrosis. Stable small parapelvic renal cysts in the left kidney. No suspicious renal masses. Normal kidneys with no renal mass. Stomach/Bowel: Small hiatal hernia. Otherwise normal nondistended stomach. Visualized small and large bowel is normal caliber, with no bowel wall thickening. Vascular/Lymphatic: Normal caliber abdominal aorta. Patent portal, splenic, hepatic and renal veins. No pathologically enlarged lymph nodes in the abdomen. Other: No abdominal ascites or focal fluid collection. Musculoskeletal: Extensive enhancing patchy confluent osseous lesions throughout the visualized thoracolumbar spine, not appreciably changed. IMPRESSION: 1. Widespread liver metastases have mildly increased. 2. Tiny clustered pulmonary nodules at the left lung base are stable. 3. Widespread patchy confluent osseous metastases throughout the visualized spine, not appreciably changed. 4. Prominent diffuse hepatic steatosis. Electronically Signed   By: Ilona Sorrel M.D.   On: 12/28/2017 08:37    ELIGIBLE FOR AVAILABLE RESEARCH PROTOCOL: P[place], but inelegible because of Mirena IUD  ASSESSMENT: 52 y.o. [place] woman status post right breast upper inner quadrant biopsy 10/15/2015 for a clinical T2-T3 N0, stage II invasive ductal carcinoma, estrogen and progesterone receptor positive, HER-2 nonamplified, with an Mib-1 of 70%  (1)  status post right lumpectomy and sentinel lymph node sampling with oncoplastic breast reduction 11/28/2015 for a pT2 pN1, stage IIB invasive ductal carcinoma, grade 1, with negative margins (1/1 sentinel node positive)  (a) reclassified as stage IIA in the 2018 prognostic revision   (2) left reduction mammoplasty 11/28/2015 unexpectedly found a pT1c pNX invasive ductal carcinoma, grade 1, estrogen and progesterone receptor positive, HER-2 not amplified, with an MIB-1 of 3%  (3) Mammaprint from the right sided breast cancer was "low risk", predicting a 98% chance of no disease recurrence within 5 years with anti-estrogens as the only systemic therapy. It also predicts minimal to no benefit from chemotherapy  (4) status post bilateral mastectomies with bilateral sentinel lymph node sampling 12/26/2015, showing  (a) on the right, no residual carcinoma (0/3 nodes involved)  (b) on the left, focal ductal carcinoma in situ, 0.2 cm, with negative margins; (0/4 nodes involved)  (5) adjuvant radiation 02/11/16 - 03/27/16    1) Right chest wall: 50.4 Gy in 28 fractions               2) Right chest wall boost: 10 Gy in 5 fractions   (6) started on tamoxifen neoadjuvantly 10/23/2015 to allow for expected delays in definitive local treatment  (a) Mirena IUD in place  (b) tamoxifen discontinued 08/13/2017, with progression  (7) since she had 4 lymph nodes removed from each axilla but also receive radiation on the right, lab draws should preferentially be obtained from the left arm   METASTATIC DISEASE: April 2019 (8) CT scan of the abdomen and pelvis 08/04/2017, MRI of the liver and total spine MRI 08/11/2017 and  CT scan of the chest and brain on 08/13/2017 showed liver and bone metastases  (a) liver biopsy 09/07/2017 confirms estrogen receptor positive, progesterone receptor negative, HER-2 negative metastatic breast cancer  (b) chest CT scan 08/13/2017 shows multiple bilateral pulmonary nodules.  (c) CT  of the brain with and without contrast 08/26/2017 shows no evidence of metastatic disease to the brain  (d) CA 27-29 is informative  (9) fulvestrant started 08/17/2017  (a) palbociclib started 08/31/2017  (b) fulvestrant and palbociclib discontinued 01/04/2018 with evidence of progression in the liver  (10) denosumab/Xgeva started 09/14/2017  (11) anastrozole started 01/04/2018  (a) everolimus to be added at 10 mg daily starting as soon as available  PLAN: Vauda is doing as well as one could expects with the news of disease progression.  She has discussed it with her family who have been very distraught.  They understand that our goal here is to turn this into a chronic illness which is not curable but is manageable.  Of course we have not yet obtained the everolimus.  R oral pharmacy specialist is trying to get that for her today or tomorrow.  Today we again reviewed the possible side effects toxicities and complications of that medication which she will start hopefully within the next 48 hours.  She will see Dr. [name] next week and hopefully radiation to the painful hip will provide some relief.  Not only wants to participate in the [place] walk early October.  I do not know if that will be possible but possibly she can participate in some way  She will see [place] again October 1.  We will review her tolerance of her treatment at that point  She knows to call for any other issues that may develop before the next visit.   Jakhiya Brower, Virgie Dad, MD  01/11/18 10:58 AM Medical Oncology and Hematology [facility] [address] [place], [place] [address] Tel. [phone]    Fax. [phone]  Alice Rieger, am acting as scribe for Chauncey Cruel MD.  I, Lurline Del MD, have reviewed the above documentation for accuracy and completeness, and I agree with the above.

## 2018-01-11 ENCOUNTER — Inpatient Hospital Stay: Payer: 59

## 2018-01-11 ENCOUNTER — Other Ambulatory Visit: Payer: 59

## 2018-01-11 ENCOUNTER — Telehealth: Payer: Self-pay | Admitting: Oncology

## 2018-01-11 ENCOUNTER — Telehealth: Payer: Self-pay | Admitting: Pharmacist

## 2018-01-11 ENCOUNTER — Inpatient Hospital Stay: Payer: 59 | Attending: Oncology | Admitting: Oncology

## 2018-01-11 VITALS — BP 157/90 | HR 79 | Temp 98.4°F | Resp 19 | Ht 66.0 in | Wt 261.5 lb

## 2018-01-11 DIAGNOSIS — C787 Secondary malignant neoplasm of liver and intrahepatic bile duct: Secondary | ICD-10-CM | POA: Insufficient documentation

## 2018-01-11 DIAGNOSIS — C50012 Malignant neoplasm of nipple and areola, left female breast: Secondary | ICD-10-CM

## 2018-01-11 DIAGNOSIS — E785 Hyperlipidemia, unspecified: Secondary | ICD-10-CM | POA: Insufficient documentation

## 2018-01-11 DIAGNOSIS — F419 Anxiety disorder, unspecified: Secondary | ICD-10-CM | POA: Diagnosis not present

## 2018-01-11 DIAGNOSIS — C50112 Malignant neoplasm of central portion of left female breast: Secondary | ICD-10-CM

## 2018-01-11 DIAGNOSIS — C50211 Malignant neoplasm of upper-inner quadrant of right female breast: Secondary | ICD-10-CM | POA: Insufficient documentation

## 2018-01-11 DIAGNOSIS — Z170 Estrogen receptor positive status [ER+]: Secondary | ICD-10-CM

## 2018-01-11 DIAGNOSIS — C7800 Secondary malignant neoplasm of unspecified lung: Secondary | ICD-10-CM | POA: Insufficient documentation

## 2018-01-11 DIAGNOSIS — C7951 Secondary malignant neoplasm of bone: Secondary | ICD-10-CM | POA: Diagnosis not present

## 2018-01-11 DIAGNOSIS — Z79810 Long term (current) use of selective estrogen receptor modulators (SERMs): Secondary | ICD-10-CM | POA: Insufficient documentation

## 2018-01-11 DIAGNOSIS — K449 Diaphragmatic hernia without obstruction or gangrene: Secondary | ICD-10-CM | POA: Insufficient documentation

## 2018-01-11 DIAGNOSIS — Z9013 Acquired absence of bilateral breasts and nipples: Secondary | ICD-10-CM | POA: Insufficient documentation

## 2018-01-11 DIAGNOSIS — Z79899 Other long term (current) drug therapy: Secondary | ICD-10-CM | POA: Diagnosis not present

## 2018-01-11 DIAGNOSIS — Z23 Encounter for immunization: Secondary | ICD-10-CM | POA: Diagnosis not present

## 2018-01-11 DIAGNOSIS — F329 Major depressive disorder, single episode, unspecified: Secondary | ICD-10-CM | POA: Diagnosis not present

## 2018-01-11 DIAGNOSIS — I10 Essential (primary) hypertension: Secondary | ICD-10-CM | POA: Insufficient documentation

## 2018-01-11 DIAGNOSIS — C50011 Malignant neoplasm of nipple and areola, right female breast: Secondary | ICD-10-CM

## 2018-01-11 LAB — CBC WITH DIFFERENTIAL/PLATELET
Basophils Absolute: 0.1 10*3/uL (ref 0.0–0.1)
Basophils Relative: 2 %
Eosinophils Absolute: 0.1 10*3/uL (ref 0.0–0.5)
Eosinophils Relative: 3 %
HCT: 38 % (ref 34.8–46.6)
Hemoglobin: 13 g/dL (ref 11.6–15.9)
Lymphocytes Relative: 28 %
Lymphs Abs: 0.9 10*3/uL (ref 0.9–3.3)
MCH: 34.9 pg — ABNORMAL HIGH (ref 25.1–34.0)
MCHC: 34.2 g/dL (ref 31.5–36.0)
MCV: 102.2 fL — ABNORMAL HIGH (ref 79.5–101.0)
Monocytes Absolute: 0.4 10*3/uL (ref 0.1–0.9)
Monocytes Relative: 11 %
Neutro Abs: 1.9 10*3/uL (ref 1.5–6.5)
Neutrophils Relative %: 56 %
Platelets: 207 10*3/uL (ref 145–400)
RBC: 3.72 MIL/uL (ref 3.70–5.45)
RDW: 14.4 % (ref 11.2–14.5)
WBC: 3.3 10*3/uL — ABNORMAL LOW (ref 3.9–10.3)

## 2018-01-11 LAB — COMPREHENSIVE METABOLIC PANEL
ALT: 43 U/L (ref 0–44)
AST: 34 U/L (ref 15–41)
Albumin: 3.8 g/dL (ref 3.5–5.0)
Alkaline Phosphatase: 117 U/L (ref 38–126)
Anion gap: 9 (ref 5–15)
BUN: 9 mg/dL (ref 6–20)
CO2: 25 mmol/L (ref 22–32)
Calcium: 9.4 mg/dL (ref 8.9–10.3)
Chloride: 106 mmol/L (ref 98–111)
Creatinine, Ser: 0.83 mg/dL (ref 0.44–1.00)
GFR calc Af Amer: >60 mL/min (ref >60)
GFR calc non Af Amer: >60 mL/min (ref >60)
Glucose, Bld: 89 mg/dL (ref 70–99)
Potassium: 4.2 mmol/L (ref 3.5–5.1)
Sodium: 140 mmol/L (ref 135–145)
Total Bilirubin: 0.4 mg/dL (ref 0.3–1.2)
Total Protein: 7.5 g/dL (ref 6.5–8.1)

## 2018-01-11 NOTE — Telephone Encounter (Signed)
Oral Oncology Pharmacist [employer] authorization for Afinitor (everolimus) originally submitted to [employer] on 01/04/2018  Authorization for Afinitor has been denied due to use with anastrozole instead of exemestane  I called [employer] customer service at [phone] to provide further information on authorization request I was informed that since determination had been delivered the only way to overturn request is to submit a new prior authorization  Urgent prior authorization for Afinitor submitted over the phone.  Representative unable to give me new reference number since original determination was just submitted last week We should receive determination within 24 business hours Reference # request to follow-up on determination is Lynwood Dawley.  01/11/2018 (representatives name and today's date)  Oral oncology clinic will continue to follow  Johny Drilling, PharmD, BCPS, BCOP  01/11/2018 11:24 AM Oral Oncology Clinic [phone]

## 2018-01-11 NOTE — Telephone Encounter (Signed)
Called patient regarding added 10/1 Appts.

## 2018-01-11 NOTE — Telephone Encounter (Signed)
Per 9/3 los, scheduled LC for 10/1 @ 11:30, labs same day.

## 2018-01-12 ENCOUNTER — Encounter: Payer: Self-pay | Admitting: Radiation Oncology

## 2018-01-12 ENCOUNTER — Telehealth: Payer: Self-pay | Admitting: Radiation Oncology

## 2018-01-12 MED FILL — AFINITOR 10 MG TABLET: 10 | 28 days supply | Qty: 28 | Fill #0

## 2018-01-12 NOTE — Progress Notes (Signed)
I spoke with the orthopedic surgeon on-call about this patient's case.  He and I agreed that it is prudent for the patient to see him for consultation and x-rays to ascertain whether prophylactic surgery is warranted before radiation to prevent a hip fracture.  This will depend upon how lytic the lesion is in her left hip.  I will keep my appointment with the patient on September 10 but we will hold radiotherapy until we get an opinion from orthopedics.  I called the patient to let her know.  She understands that the orthopedic office will call her with an appointment in the near future. -----------------------------------  [name], MD

## 2018-01-12 NOTE — Progress Notes (Signed)
Histology and Location of Primary Cancer:  dx 06/ 2017  right breast invasive ductal carcinoma, Stage IIB, ER/PR + (pT2 pN1), grade 1-- s/p  right breast lumpecotmy w/ sln bx's and bilateral breast reduction 11-28-2015/  left breast with reduction , dx invasive ductal ca, Grade 1 (pT1cpNX) ER/PR positive/   Sites of Visceral and Bony Metastatic Disease:  Liver, lung metastasis. bone metastasis- located within T11, L2, S1, RIGHT ischium/acetabulum and proximal LEFT femur.  Location(s) of Symptomatic Metastases: Left Hip  Past/Anticipated chemotherapy by medical oncology, if any:  01/12/18 Dr. [name]  fulvestrant started 08/17/2017             (a) palbociclib started 08/31/2017             (b) fulvestrant and palbociclib discontinued 01/04/2018 with evidence of progression in the liver  denosumab/Xgeva started 09/14/2017   anastrozole started 01/04/2018             (a) everolimus to be added at 10 mg daily starting as soon as available  She will see Dr. Squire next week and hopefully radiation to the painful hip will provide some relief.  Not only wants to participate in the 5K walk early October.  I do not know if that will be possible but possibly she can participate in some way She will see us again October 1.  We will review her tolerance of her treatment at that point   Pain on a scale of 0-10 is: She denies pain   If Spine Met(s), symptoms, if any, include:  Bowel/Bladder retention or incontinence (please describe):   Numbness or weakness in extremities (please describe):   Current Decadron regimen, if applicable:   Ambulatory status? Walker? Wheelchair?: She is ambulatory, but using a cane for support.   SAFETY ISSUES: Prior radiation? Yes, Radiation treatment dates:   02/11/16 - 03/27/16 Site/dose:   1) Right chest wall: 50.4 Gy in 28 fractions                                              2) Right chest wall boost: 10 Gy in 5 fractions  Pacemaker/ICD? No  Possible  current pregnancy? No  Is the patient on methotrexate? No  Current Complaints / other details:    BP (!) 143/74 (BP Location: Left Arm, Patient Position: Sitting)   Pulse 87   Temp 98.9 F (37.2 C) (Oral)   Resp 20   Ht 5' 6.5" (1.689 m)   Wt 261 lb 12.8 oz (118.8 kg)   BMI 41.62 kg/m    Wt Readings from Last 3 Encounters:  01/18/18 261 lb 12.8 oz (118.8 kg)  01/11/18 261 lb 8 oz (118.6 kg)  01/04/18 262 lb 12.8 oz (119.2 kg)     

## 2018-01-12 NOTE — Telephone Encounter (Signed)
Oral Oncology Patient Advocate Encounter  Prior Authorization for Afinitor has been approved.    PA# [id number] Effective dates: 01/06/18 through 01/06/19  Oral Oncology Clinic will continue to follow.   [place] Patient [place] Phone [phone] Fax [phone]

## 2018-01-13 ENCOUNTER — Other Ambulatory Visit: Payer: Self-pay | Admitting: *Deleted

## 2018-01-13 MED ORDER — PROCHLORPERAZINE MALEATE 10 MG PO TABS: 10.0000 mg | ORAL_TABLET | Freq: Four times a day (QID) | ORAL | 0 refills | Status: DC | PRN

## 2018-01-13 MED FILL — DEXAMETHASONE 0.5 MG/5 ML L: 0.5 | 12 days supply | Qty: 500 | Fill #0

## 2018-01-13 MED FILL — PROCHLORPERAZINE 10 MG TAB: 10 | 5 days supply | Qty: 30 | Fill #0

## 2018-01-14 NOTE — Telephone Encounter (Signed)
Oral Oncology Patient Advocate Encounter  Confirmed with [place] that Afinitor was picked up 01/14/18  [place] [facility] [place] Phone [phone] Fax [phone]

## 2018-01-18 ENCOUNTER — Ambulatory Visit
Admission: RE | Admit: 2018-01-18 | Discharge: 2018-01-18 | Disposition: A | Discharge status: Home / Self Care | Payer: 59 | Source: Ambulatory Visit | Attending: Radiation Oncology | Admitting: Radiation Oncology

## 2018-01-18 ENCOUNTER — Other Ambulatory Visit: Payer: Self-pay

## 2018-01-18 ENCOUNTER — Encounter: Payer: Self-pay | Admitting: Radiation Oncology

## 2018-01-18 ENCOUNTER — Ambulatory Visit: Payer: 59 | Admitting: Radiation Oncology

## 2018-01-18 VITALS — BP 143/74 | HR 87 | Temp 98.9°F | Resp 20 | Ht 66.5 in | Wt 261.8 lb

## 2018-01-18 DIAGNOSIS — C7800 Secondary malignant neoplasm of unspecified lung: Secondary | ICD-10-CM | POA: Insufficient documentation

## 2018-01-18 DIAGNOSIS — C7951 Secondary malignant neoplasm of bone: Secondary | ICD-10-CM | POA: Insufficient documentation

## 2018-01-18 DIAGNOSIS — Z853 Personal history of malignant neoplasm of breast: Secondary | ICD-10-CM | POA: Insufficient documentation

## 2018-01-18 DIAGNOSIS — Z79899 Other long term (current) drug therapy: Secondary | ICD-10-CM | POA: Diagnosis not present

## 2018-01-18 DIAGNOSIS — Z9013 Acquired absence of bilateral breasts and nipples: Secondary | ICD-10-CM | POA: Diagnosis not present

## 2018-01-18 DIAGNOSIS — C50911 Malignant neoplasm of unspecified site of right female breast: Secondary | ICD-10-CM | POA: Diagnosis not present

## 2018-01-18 DIAGNOSIS — G8929 Other chronic pain: Secondary | ICD-10-CM | POA: Diagnosis not present

## 2018-01-18 DIAGNOSIS — C787 Secondary malignant neoplasm of liver and intrahepatic bile duct: Secondary | ICD-10-CM | POA: Diagnosis not present

## 2018-01-18 DIAGNOSIS — Z923 Personal history of irradiation: Secondary | ICD-10-CM | POA: Diagnosis not present

## 2018-01-18 DIAGNOSIS — Z79811 Long term (current) use of aromatase inhibitors: Secondary | ICD-10-CM | POA: Diagnosis not present

## 2018-01-18 NOTE — Progress Notes (Signed)
Radiation Oncology         [phone] ________________________________  Outpatient Re-Consultation  Name: Debbie [name] MRN: [id number]  Date: 01/18/2018  DOB: Oct 08, 1965  EV:[id number], Margaretha Sheffield, MD  [name], Virgie Dad, MD   REFERRING PHYSICIAN: [name], Virgie Dad, MD  DIAGNOSIS:    ICD-10-CM   1. Bone metastases ([place]) C79.51    Stage IV breast cancer, with left hip pain  CHIEF COMPLAINT: Here to discuss management of metastatic breast cancer  HISTORY OF PRESENT ILLNESS::Debbie [name] is a 52 y.o. female who was diagnosed in June 2017 with Stage IIB right breast invasive ductal carcinoma. She was treated with lumpectomy and adjuvant radiotherapy. She has been managed by Dr. [name] with chemotherapy and anti-estrogen therapy. She developed metastatic disease to the liver, bone, and bilateral lungs seen on imaging from March-April 2019. Head CT on 08/26/2017 showed no evidence of metastatic disease to the brain. Liver biopsy on 09/07/2017 confirmed metastatic breast cancer. MRI total spine 08-12-17 showed metastatic disease at each level - no epidural disease.  Bone scan dated 12/24/2017 showed diffuse bony metastases. Most conspicuous metastases are located within T11, L2, S1, right ischium/acetabulum, and proximal left femur. MRI of the liver on 12/28/2017 showed widespread liver metastases, mildly increased. Tiny clustered pulmonary nodules at the left lung base are stable. Widespread patchy confluent osseous metastases throughout the visualized spine, not appreciably changed. The patient has been referred back today for discussion of palliative radiation treatment options. She was referred to orthopedics and reports she will see Dr. Stann Mainland tomorrow.  I've reviewed her images.    On review of systems, the patient reports chronic mild back pain with standing. The pain does not affect her ADLs. She states that applying pressure relieves the pain. She reports intermittent left hip  pain. Her hip pops and grinds with certain movements. When she stands up from sitting, she is initially stiff but is able to "walk off" the pain. She is ambulatory but is using a cane for support. She states that she plans to participate in the [place] walk in October.  PREVIOUS RADIATION THERAPY: Yes -  Radiation treatment dates:02/11/16 - 03/27/16 Site/dose: 1) Right chest wall: 50.4 Gy in 28 fractions 2) Right SCV/PAB nodes: 45 Gy in 25 fractions 2) Right chest wall boost: 10 Gy in 5 fractions  PAST MEDICAL HISTORY:  has a past medical history of Anxiety, Bone metastasis ([place]), Breast cancer of upper-inner quadrant of right female breast [facility]) (oncologist-  dr [name]--- 04/ 2019 ER/PR+ Stage IV w/ metastatic disease to liver, total spine, lung, bone), Cancer, metastatic to liver [facility]), Chronic back pain, Depression, Family history of adverse reaction to anesthesia, Headache, Heart murmur, History of hyperthyroidism, History of radiation therapy (02/11/16- 03/27/16), Hyperlipidemia, Hypertension, Hypothyroidism, postradioiodine therapy, Menorrhagia (2011), Multiple pulmonary nodules, PONV (postoperative nausea and vomiting), SUI (stress urinary incontinence, female), Wears contact lenses, and Wears dentures.    PAST SURGICAL HISTORY: Past Surgical History:  Procedure Laterality Date  . BREAST LUMPECTOMY WITH NEEDLE LOCALIZATION AND AXILLARY SENTINEL LYMPH NODE BX Right 11/28/2015   Procedure: RIGHT BREAST LUMPECTOMY WITH NEEDLE LOCALIZATION AND AXILLARY SENTINEL LYMPH NODE BX;  Surgeon: Autumn Messing III, MD;  Location: [place];  Service: General;  Laterality: Right;  . BREAST REDUCTION SURGERY Bilateral 11/28/2015   Procedure: MAMMARY REDUCTION  (BREAST) BILATERAL;  Surgeon: Crissie Reese, MD;  Location: [place];  Service: Plastics;  Laterality: Bilateral;  . CESAREAN SECTION  01/12/1993  . COLONOSCOPY    . HYSTEROSCOPY N/A 09/02/2017  Procedure: HYSTEROSCOPY  to remove IUD;   Surgeon: Eldred Manges, MD;  [facility];  Service: Gynecology;  Laterality: N/A;  . MASTECTOMY W/ SENTINEL NODE BIOPSY Bilateral 12/26/2015   Procedure: BILATERAL MASTECTOMY WITH LEFT SENTINEL LYMPH NODE BIOPSY;  Surgeon: Autumn Messing III, MD;  Location: [place];  Service: General;  Laterality: Bilateral;  . TUBAL LIGATION Bilateral 1995    FAMILY HISTORY: family history includes Cancer in her paternal grandmother; Colon cancer in her paternal grandmother; Diabetes in her maternal grandmother and mother; Hypertension in her father, mother, and sister.  SOCIAL HISTORY:  reports that she has never smoked. She has never used smokeless tobacco. She reports that she does not drink alcohol or use drugs.  ALLERGIES: Ace inhibitors; Atorvastatin; and Simvastatin  MEDICATIONS:  Current Outpatient Medications  Medication Sig Dispense Refill  . acetaminophen (TYLENOL) 325 MG tablet Take 650 mg by mouth every 6 (six) hours as needed for mild pain. Reported on 10/23/2015    . anastrozole (ARIMIDEX) 1 MG tablet Take 1 tablet (1 mg total) by mouth daily. 90 tablet 4  . Calcium-Magnesium 250-125 MG TABS Take 1 tablet by mouth daily. 120 each 4  . Cetirizine HCl (ZYRTEC ALLERGY PO) Take 1 tablet by mouth daily as needed (allergies).     Marland Kitchen dexamethasone (DECADRON) 0.5 MG/5ML solution Swish 31mL in mouth for 4min and spit out. Use 4 times daily for 8 weeks, start with Afinitor. Avoid eating/drinking for 1hr after rinse. 500 mL 4  . everolimus (AFINITOR) 10 MG tablet Take 1 tablet (10 mg total) by mouth daily. Take at approximately the same time each day with a glass of water. 28 tablet 6  . hydrochlorothiazide (MICROZIDE) 12.5 MG capsule Take 12.5 mg by mouth every morning.     Marland Kitchen ibuprofen (ADVIL,MOTRIN) 600 MG tablet Take 1 tablet (600 mg total) by mouth every 6 (six) hours as needed for moderate pain. 60 tablet 0  . levothyroxine (SYNTHROID, LEVOTHROID) 137 MCG tablet Take 137 mcg by  mouth daily before breakfast.    . LIVALO 2 MG TABS Take 0.2 mg by mouth daily.   6  . losartan (COZAAR) 100 MG tablet Take 100 mg by mouth every morning.     . Multiple Vitamin (MULTIVITAMIN) tablet Take 1 tablet by mouth daily.    Marland Kitchen omega-3 acid ethyl esters (LOVAZA) 1 g capsule Take 1 g by mouth 2 (two) times daily.     . potassium chloride SA (K-DUR,KLOR-CON) 20 MEQ tablet Take 40 mEq by mouth 2 (two) times daily.     . prochlorperazine (COMPAZINE) 10 MG tablet Take 1 tablet (10 mg total) by mouth every 6 (six) hours as needed for nausea or vomiting. 30 tablet 0  . Vitamin D, Cholecalciferol, 1000 units CAPS Take 1 tablet by mouth daily. 90 capsule 4  . traMADol (ULTRAM) 50 MG tablet Take 1 tablet (50 mg total) by mouth every 6 (six) hours as needed. (Patient not taking: Reported on 01/18/2018) 60 tablet 3   No current facility-administered medications for this encounter.     REVIEW OF SYSTEMS:  As above   PHYSICAL EXAM:  height is 5' 6.5" (1.689 m) and weight is 261 lb 12.8 oz (118.8 kg). Her oral temperature is 98.9 F (37.2 C). Her blood pressure is 143/74 (abnormal) and her pulse is 87. Her respiration is 20.   General: Alert and oriented, in no acute distress. Musculoskeletal: She limps a bit with her left leg when  she initially stands up. She doesn't have any tenderness to palpation through the entire spine. She has some tenderness to palpation on the left side in the region of her hip. She has good ROM in her legs bilaterally. Psychiatric: Judgment and insight are intact. Affect is appropriate.   ECOG = 1  0 - Asymptomatic (Fully active, able to carry on all predisease activities without restriction)  1 - Symptomatic but completely ambulatory (Restricted in physically strenuous activity but ambulatory and able to carry out work of a light or sedentary nature. For example, light housework, office work)  2 - Symptomatic, <50% in bed during the day (Ambulatory and capable of all  self care but unable to carry out any work activities. Up and about more than 50% of waking hours)  3 - Symptomatic, >50% in bed, but not bedbound (Capable of only limited self-care, confined to bed or chair 50% or more of waking hours)  4 - Bedbound (Completely disabled. Cannot carry on any self-care. Totally confined to bed or chair)  5 - Death   Eustace Pen MM, Creech RH, Tormey DC, et al. [phone]). "Toxicity and response criteria of the [facility]". [place] Oncol. 5 (6): 649-55   LABORATORY DATA:  Lab Results  Component Value Date   WBC 3.3 (L) 01/11/2018   HGB 13.0 01/11/2018   HCT 38.0 01/11/2018   MCV 102.2 (H) 01/11/2018   PLT 207 01/11/2018   CMP     Component Value Date/Time   NA 140 01/11/2018 0950   NA 140 07/08/2016 0948   K 4.2 01/11/2018 0950   K 4.1 07/08/2016 0948   CL 106 01/11/2018 0950   CO2 25 01/11/2018 0950   CO2 24 07/08/2016 0948   GLUCOSE 89 01/11/2018 0950   GLUCOSE 89 07/08/2016 0948   BUN 9 01/11/2018 0950   BUN 11.9 07/08/2016 0948   CREATININE 0.83 01/11/2018 0950   CREATININE 0.90 06/22/2017 1147   CREATININE 0.9 07/08/2016 0948   CALCIUM 9.4 01/11/2018 0950   CALCIUM 10.0 07/08/2016 0948   PROT 7.5 01/11/2018 0950   PROT 7.6 07/08/2016 0948   ALBUMIN 3.8 01/11/2018 0950   ALBUMIN 3.6 07/08/2016 0948   AST 34 01/11/2018 0950   AST 31 06/22/2017 1147   AST 22 07/08/2016 0948   ALT 43 01/11/2018 0950   ALT 45 06/22/2017 1147   ALT 27 07/08/2016 0948   ALKPHOS 117 01/11/2018 0950   ALKPHOS 115 07/08/2016 0948   BILITOT 0.4 01/11/2018 0950   BILITOT 0.4 06/22/2017 1147   BILITOT 0.35 07/08/2016 0948   GFRNONAA >60 01/11/2018 0950   GFRNONAA >60 06/22/2017 1147   GFRAA >60 01/11/2018 0950   GFRAA >60 06/22/2017 1147         RADIOGRAPHY: Nm Bone Scan Whole Body  Result Date: 12/25/2017 CLINICAL DATA:  52 year old female for restaging of stage IV breast cancer with hepatic, bone and pulmonary metastases.  Undergoing chemotherapy. EXAM: NUCLEAR MEDICINE WHOLE BODY BONE SCAN TECHNIQUE: Whole body anterior and posterior images were obtained approximately 3 hours after intravenous injection of radiopharmaceutical. RADIOPHARMACEUTICALS:  21.0 mCi Technetium-34m MDP IV COMPARISON:  08/13/2017 CT, 08/12/2017 MR and prior studies. No prior bone scans available for comparison. FINDINGS: The same spine numbering system will be utilized as on the 08/12/2017 MR. Abnormal increased and heterogeneous activity throughout the thoracic spine, lumbar spine and bony pelvis noted compatible with metastatic disease, greatest within T11, L2, RIGHT aspect of S1 and RIGHT ischium/acetabulum. Mild increased  activity/metastases within numerous ribs also noted. A moderate to large area of increased activity/metastasis within the proximal LEFT femur is noted. Degenerative changes within the knees and shoulders are present. IMPRESSION: Diffuse bony metastases as described. Most conspicuous metastases are located within T11, L2, S1, RIGHT ischium/acetabulum and proximal LEFT femur. Electronically Signed   By: Margarette Canada M.D.   On: 12/25/2017 12:24   Mr Liver W Wo Contrast  Result Date: 12/28/2017 CLINICAL DATA:  Stage IV right breast cancer with liver, bone and lung metastases. Ongoing chemotherapy. Restaging. EXAM: MRI ABDOMEN WITHOUT AND WITH CONTRAST TECHNIQUE: Multiplanar multisequence MR imaging of the abdomen was performed both before and after the administration of intravenous contrast. CONTRAST:  71mL MULTIHANCE GADOBENATE DIMEGLUMINE 529 MG/ML IV SOLN COMPARISON:  08/11/2017 MRI abdomen. FINDINGS: Significantly motion degraded scan, limiting assessment, particularly on the postcontrast images. Lower chest: Clustered small pulmonary nodules at the left lung base measuring up to 5 mm (series 3/image 16) are stable. No acute abnormality at the lung bases. Hepatobiliary: Mild hepatomegaly. Severe diffuse hepatic steatosis on chemical  shift imaging. Numerous (greater than 30) similar enhancing liver masses scattered throughout the liver, all mildly increased in size. Representative liver masses as follows: -peripheral right liver lobe 2.2 x 1.8 cm mass (series 3/image 16), mildly increased from 1.8 x 1.7 cm on 08/11/2017 MRI -segment 8 right liver lobe 1.2 x 0.9 cm mass (series 3/image 21), not appreciably changed from 1.1 x 0.9 cm -posterior inferior right liver lobe 2.4 x 1.7 cm mass (series 3/image 29), increased from 1.4 x 1.3 cm Normal gallbladder with no cholelithiasis. No biliary ductal dilatation. Common bile duct diameter 3 mm. No choledocholithiasis. Pancreas: No pancreatic mass or duct dilation.  No pancreas divisum. Spleen: Normal size. No mass. Adrenals/Urinary Tract: Normal adrenals. No hydronephrosis. Stable small parapelvic renal cysts in the left kidney. No suspicious renal masses. Normal kidneys with no renal mass. Stomach/Bowel: Small hiatal hernia. Otherwise normal nondistended stomach. Visualized small and large bowel is normal caliber, with no bowel wall thickening. Vascular/Lymphatic: Normal caliber abdominal aorta. Patent portal, splenic, hepatic and renal veins. No pathologically enlarged lymph nodes in the abdomen. Other: No abdominal ascites or focal fluid collection. Musculoskeletal: Extensive enhancing patchy confluent osseous lesions throughout the visualized thoracolumbar spine, not appreciably changed. IMPRESSION: 1. Widespread liver metastases have mildly increased. 2. Tiny clustered pulmonary nodules at the left lung base are stable. 3. Widespread patchy confluent osseous metastases throughout the visualized spine, not appreciably changed. 4. Prominent diffuse hepatic steatosis. Electronically Signed   By: Ilona Sorrel M.D.   On: 12/28/2017 08:37      IMPRESSION/PLAN: breast cancer, metastatic  We discussed the risks, benefits, and side effects (skin irritation, nausea, fatigue) of radiotherapy to the left  hip/femur with palliative intent. No guarantees of treatment were given. A consent form was signed and placed in the patient's medical record. The patient is enthusiastic about proceeding with treatment. I look forward to participating in the patient's care.  The patient has been referred to orthopedic surgery and will see Dr. Stann Mainland tomorrow. We will hold radiotherapy until we get an opinion from him - surgery might be recommended to stabilize the hip, first. We will then move forward with CT simulation/planning. There is not much prophylactic /palliative value to treating her spine as she doesn't have significant pain, currently. Plan to treat left hip and femur to improve pain and quality of life. Anticipate 10 daily treatments.  Massage therapy offered for back pain - signed  a form for this to be provided at [facility].  I spent 20 minutes face to face with the patient and more than 50% of that time was spent in counseling and/or coordination of care. __________________________________________   [name], MD  This document serves as a record of services personally performed by [name], MD. It was created on her behalf by Rae Lips, a trained medical scribe. The creation of this record is based on the scribe's personal observations and the provider's statements to them. This document has been checked and approved by the attending provider.

## 2018-01-19 ENCOUNTER — Other Ambulatory Visit: Payer: Self-pay | Admitting: Orthopedic Surgery

## 2018-01-19 ENCOUNTER — Ambulatory Visit
Admission: RE | Admit: 2018-01-19 | Discharge: 2018-01-19 | Disposition: A | Discharge status: Home / Self Care | Payer: 59 | Source: Ambulatory Visit | Attending: Orthopedic Surgery | Admitting: Orthopedic Surgery

## 2018-01-19 DIAGNOSIS — R937 Abnormal findings on diagnostic imaging of other parts of musculoskeletal system: Secondary | ICD-10-CM | POA: Diagnosis not present

## 2018-01-19 DIAGNOSIS — C50919 Malignant neoplasm of unspecified site of unspecified female breast: Secondary | ICD-10-CM | POA: Diagnosis not present

## 2018-01-19 DIAGNOSIS — M84552A Pathological fracture in neoplastic disease, left femur, initial encounter for fracture: Secondary | ICD-10-CM | POA: Diagnosis not present

## 2018-01-19 DIAGNOSIS — C7951 Secondary malignant neoplasm of bone: Secondary | ICD-10-CM | POA: Diagnosis not present

## 2018-01-25 DIAGNOSIS — E039 Hypothyroidism, unspecified: Secondary | ICD-10-CM | POA: Diagnosis not present

## 2018-01-26 MED FILL — HYDROCHLOROTHIAZIDE 12.5 MG: 12.5 | 90 days supply | Qty: 90 | Fill #2

## 2018-01-27 ENCOUNTER — Other Ambulatory Visit: Payer: Self-pay

## 2018-01-27 ENCOUNTER — Encounter (HOSPITAL_COMMUNITY): Payer: Self-pay | Admitting: *Deleted

## 2018-01-27 MED ORDER — DEXTROSE 5 % IV SOLN
3.0000 g | INTRAVENOUS | Status: AC
  Administered 2018-01-28: 3 g via INTRAVENOUS
  Filled 2018-01-27: qty 3

## 2018-01-27 NOTE — Progress Notes (Signed)
Mrs Peden will spend 1 night in the hospital, she questioned if the hospital has Everolimus and Decadron 0.5/73ml .  I called the main pharmacy , they do not Everolimus, but do have Decadron elixir 0.5/78ml.  I called patient and informed that she will need to bring Everolimus, which will be confirmed by the pharmacy and  dispensed from the pharmacy

## 2018-01-28 ENCOUNTER — Inpatient Hospital Stay (HOSPITAL_COMMUNITY): Payer: 59

## 2018-01-28 ENCOUNTER — Encounter (HOSPITAL_COMMUNITY)
Admission: RE | Disposition: A | Discharge status: Home / Self Care | Payer: Self-pay | Source: Home / Self Care | Attending: Orthopedic Surgery

## 2018-01-28 ENCOUNTER — Inpatient Hospital Stay (HOSPITAL_COMMUNITY)
Admission: RE | Admit: 2018-01-28 | Discharge: 2018-01-29 | DRG: 481 | Disposition: A | Discharge status: Home / Self Care | Payer: 59 | Attending: Orthopedic Surgery | Admitting: Orthopedic Surgery

## 2018-01-28 ENCOUNTER — Encounter (HOSPITAL_COMMUNITY): Payer: Self-pay

## 2018-01-28 ENCOUNTER — Inpatient Hospital Stay (HOSPITAL_COMMUNITY): Payer: 59 | Admitting: Anesthesiology

## 2018-01-28 DIAGNOSIS — E890 Postprocedural hypothyroidism: Secondary | ICD-10-CM | POA: Diagnosis not present

## 2018-01-28 DIAGNOSIS — Z923 Personal history of irradiation: Secondary | ICD-10-CM | POA: Diagnosis not present

## 2018-01-28 DIAGNOSIS — Z79890 Hormone replacement therapy (postmenopausal): Secondary | ICD-10-CM | POA: Diagnosis not present

## 2018-01-28 DIAGNOSIS — Z6841 Body Mass Index (BMI) 40.0 and over, adult: Secondary | ICD-10-CM

## 2018-01-28 DIAGNOSIS — Z79891 Long term (current) use of opiate analgesic: Secondary | ICD-10-CM | POA: Diagnosis not present

## 2018-01-28 DIAGNOSIS — Z170 Estrogen receptor positive status [ER+]: Secondary | ICD-10-CM | POA: Diagnosis not present

## 2018-01-28 DIAGNOSIS — I10 Essential (primary) hypertension: Secondary | ICD-10-CM | POA: Diagnosis present

## 2018-01-28 DIAGNOSIS — Z800 Family history of malignant neoplasm of digestive organs: Secondary | ICD-10-CM

## 2018-01-28 DIAGNOSIS — C7951 Secondary malignant neoplasm of bone: Principal | ICD-10-CM | POA: Diagnosis present

## 2018-01-28 DIAGNOSIS — Z888 Allergy status to other drugs, medicaments and biological substances status: Secondary | ICD-10-CM | POA: Diagnosis not present

## 2018-01-28 DIAGNOSIS — S72402A Unspecified fracture of lower end of left femur, initial encounter for closed fracture: Secondary | ICD-10-CM | POA: Diagnosis not present

## 2018-01-28 DIAGNOSIS — Z9851 Tubal ligation status: Secondary | ICD-10-CM

## 2018-01-28 DIAGNOSIS — Z9013 Acquired absence of bilateral breasts and nipples: Secondary | ICD-10-CM | POA: Diagnosis not present

## 2018-01-28 DIAGNOSIS — Z79899 Other long term (current) drug therapy: Secondary | ICD-10-CM

## 2018-01-28 DIAGNOSIS — M84552A Pathological fracture in neoplastic disease, left femur, initial encounter for fracture: Secondary | ICD-10-CM | POA: Diagnosis not present

## 2018-01-28 DIAGNOSIS — E039 Hypothyroidism, unspecified: Secondary | ICD-10-CM | POA: Diagnosis not present

## 2018-01-28 DIAGNOSIS — C787 Secondary malignant neoplasm of liver and intrahepatic bile duct: Secondary | ICD-10-CM | POA: Diagnosis not present

## 2018-01-28 DIAGNOSIS — C50211 Malignant neoplasm of upper-inner quadrant of right female breast: Secondary | ICD-10-CM | POA: Diagnosis not present

## 2018-01-28 DIAGNOSIS — F418 Other specified anxiety disorders: Secondary | ICD-10-CM | POA: Diagnosis not present

## 2018-01-28 DIAGNOSIS — E785 Hyperlipidemia, unspecified: Secondary | ICD-10-CM | POA: Diagnosis present

## 2018-01-28 DIAGNOSIS — Z79811 Long term (current) use of aromatase inhibitors: Secondary | ICD-10-CM

## 2018-01-28 DIAGNOSIS — Z825 Family history of asthma and other chronic lower respiratory diseases: Secondary | ICD-10-CM

## 2018-01-28 DIAGNOSIS — Z833 Family history of diabetes mellitus: Secondary | ICD-10-CM | POA: Diagnosis not present

## 2018-01-28 DIAGNOSIS — Z8249 Family history of ischemic heart disease and other diseases of the circulatory system: Secondary | ICD-10-CM | POA: Diagnosis not present

## 2018-01-28 DIAGNOSIS — Z419 Encounter for procedure for purposes other than remedying health state, unspecified: Secondary | ICD-10-CM

## 2018-01-28 DIAGNOSIS — M84452A Pathological fracture, left femur, initial encounter for fracture: Secondary | ICD-10-CM | POA: Diagnosis not present

## 2018-01-28 HISTORY — PX: FEMUR IM NAIL: SHX1597

## 2018-01-28 HISTORY — DX: Constipation, unspecified: K59.00

## 2018-01-28 HISTORY — DX: Unspecified osteoarthritis, unspecified site: M19.90

## 2018-01-28 LAB — CBC
HCT: 41.3 % (ref 36.0–46.0)
Hemoglobin: 13.8 g/dL (ref 12.0–15.0)
MCH: 34.4 pg — ABNORMAL HIGH (ref 26.0–34.0)
MCHC: 33.4 g/dL (ref 30.0–36.0)
MCV: 103 fL — ABNORMAL HIGH (ref 78.0–100.0)
Platelets: 308 10*3/uL (ref 150–400)
RBC: 4.01 MIL/uL (ref 3.87–5.11)
RDW: 12.6 % (ref 11.5–15.5)
WBC: 4.8 10*3/uL (ref 4.0–10.5)

## 2018-01-28 LAB — BASIC METABOLIC PANEL
Anion gap: 11 (ref 5–15)
BUN: 11 mg/dL (ref 6–20)
CO2: 28 mmol/L (ref 22–32)
Calcium: 9 mg/dL (ref 8.9–10.3)
Chloride: 101 mmol/L (ref 98–111)
Creatinine, Ser: 0.83 mg/dL (ref 0.44–1.00)
GFR calc Af Amer: >60 mL/min (ref >60)
GFR calc non Af Amer: >60 mL/min (ref >60)
Glucose, Bld: 100 mg/dL — ABNORMAL HIGH (ref 70–99)
Potassium: 3 mmol/L — ABNORMAL LOW (ref 3.5–5.1)
Sodium: 140 mmol/L (ref 135–145)

## 2018-01-28 LAB — POCT PREGNANCY, URINE: Preg Test, Ur: NEGATIVE

## 2018-01-28 SURGERY — INSERTION, INTRAMEDULLARY ROD, FEMUR
Anesthesia: General | Site: Leg Upper | Laterality: Left

## 2018-01-28 MED ORDER — MIDAZOLAM HCL 2 MG/2ML IJ SOLN
INTRAMUSCULAR | Status: AC
  Filled 2018-01-28: qty 2

## 2018-01-28 MED ORDER — ONDANSETRON HCL 4 MG/2ML IJ SOLN
INTRAMUSCULAR | Status: AC
  Filled 2018-01-28: qty 2

## 2018-01-28 MED ORDER — LOSARTAN POTASSIUM 50 MG PO TABS
100.0000 mg | ORAL_TABLET | Freq: Every morning | ORAL | Status: DC
  Administered 2018-01-28 – 2018-01-29 (×2): 100 mg via ORAL
  Filled 2018-01-28 (×2): qty 2

## 2018-01-28 MED ORDER — DEXAMETHASONE SODIUM PHOSPHATE 10 MG/ML IJ SOLN
INTRAMUSCULAR | Status: AC
  Filled 2018-01-28: qty 1

## 2018-01-28 MED ORDER — PROMETHAZINE HCL 25 MG/ML IJ SOLN: 6.2500 mg | INTRAMUSCULAR | Status: DC | PRN

## 2018-01-28 MED ORDER — SCOPOLAMINE 1 MG/3DAYS TD PT72
1.0000 | MEDICATED_PATCH | Freq: Once | TRANSDERMAL | Status: DC
  Administered 2018-01-28: 1.5 mg via TRANSDERMAL
  Filled 2018-01-28: qty 1

## 2018-01-28 MED ORDER — ACETAMINOPHEN 325 MG PO TABS: 325.0000 mg | ORAL_TABLET | Freq: Four times a day (QID) | ORAL | Status: DC | PRN

## 2018-01-28 MED ORDER — DIPHENHYDRAMINE HCL 50 MG/ML IJ SOLN
INTRAMUSCULAR | Status: AC
  Filled 2018-01-28: qty 1

## 2018-01-28 MED ORDER — EVEROLIMUS 10 MG PO TABS: 10.0000 mg | ORAL_TABLET | Freq: Every day | ORAL | Status: DC

## 2018-01-28 MED ORDER — MIDAZOLAM HCL 5 MG/5ML IJ SOLN
INTRAMUSCULAR | Status: DC | PRN
  Administered 2018-01-28: 2 mg via INTRAVENOUS

## 2018-01-28 MED ORDER — HYDROCODONE-ACETAMINOPHEN 5-325 MG PO TABS
ORAL_TABLET | ORAL | Status: AC
  Filled 2018-01-28: qty 1

## 2018-01-28 MED ORDER — HYDROCHLOROTHIAZIDE 12.5 MG PO CAPS
12.5000 mg | ORAL_CAPSULE | Freq: Every morning | ORAL | Status: DC
  Administered 2018-01-28 – 2018-01-29 (×2): 12.5 mg via ORAL
  Filled 2018-01-28 (×2): qty 1

## 2018-01-28 MED ORDER — NEOSTIGMINE METHYLSULFATE 3 MG/3ML IV SOSY
PREFILLED_SYRINGE | INTRAVENOUS | Status: AC
  Filled 2018-01-28: qty 3

## 2018-01-28 MED ORDER — CEFAZOLIN SODIUM-DEXTROSE 2-4 GM/100ML-% IV SOLN
INTRAVENOUS | Status: AC
  Filled 2018-01-28: qty 100

## 2018-01-28 MED ORDER — DOCUSATE SODIUM 100 MG PO CAPS
100.0000 mg | ORAL_CAPSULE | Freq: Two times a day (BID) | ORAL | Status: DC
  Administered 2018-01-28 – 2018-01-29 (×3): 100 mg via ORAL
  Filled 2018-01-28 (×3): qty 1

## 2018-01-28 MED ORDER — PROCHLORPERAZINE MALEATE 10 MG PO TABS
10.0000 mg | ORAL_TABLET | Freq: Four times a day (QID) | ORAL | Status: DC | PRN
  Filled 2018-01-28: qty 1

## 2018-01-28 MED ORDER — ACETAMINOPHEN 500 MG PO TABS: 1000.0000 mg | ORAL_TABLET | Freq: Once | ORAL | Status: DC

## 2018-01-28 MED ORDER — DIPHENHYDRAMINE HCL 50 MG/ML IJ SOLN
INTRAMUSCULAR | Status: DC | PRN
  Administered 2018-01-28: 12.5 mg via INTRAVENOUS

## 2018-01-28 MED ORDER — GABAPENTIN 300 MG PO CAPS: 300.0000 mg | ORAL_CAPSULE | Freq: Once | ORAL | Status: DC

## 2018-01-28 MED ORDER — ROCURONIUM BROMIDE 50 MG/5ML IV SOSY
PREFILLED_SYRINGE | INTRAVENOUS | Status: AC
  Filled 2018-01-28: qty 5

## 2018-01-28 MED ORDER — CHLORHEXIDINE GLUCONATE 4 % EX LIQD: 60.0000 mL | Freq: Once | CUTANEOUS | Status: DC

## 2018-01-28 MED ORDER — POTASSIUM CHLORIDE CRYS ER 20 MEQ PO TBCR
40.0000 meq | EXTENDED_RELEASE_TABLET | Freq: Every day | ORAL | Status: DC
  Administered 2018-01-28 – 2018-01-29 (×2): 40 meq via ORAL
  Filled 2018-01-28 (×2): qty 2

## 2018-01-28 MED ORDER — 0.9 % SODIUM CHLORIDE (POUR BTL) OPTIME
TOPICAL | Status: DC | PRN
  Administered 2018-01-28: 1000 mL

## 2018-01-28 MED ORDER — DOCUSATE SODIUM 100 MG PO CAPS: 200.0000 mg | ORAL_CAPSULE | Freq: Every day | ORAL | Status: DC

## 2018-01-28 MED ORDER — CEFAZOLIN SODIUM-DEXTROSE 2-4 GM/100ML-% IV SOLN
2.0000 g | Freq: Four times a day (QID) | INTRAVENOUS | Status: AC
  Administered 2018-01-28 – 2018-01-29 (×3): 2 g via INTRAVENOUS
  Filled 2018-01-28 (×3): qty 100

## 2018-01-28 MED ORDER — ROCURONIUM BROMIDE 50 MG/5ML IV SOSY
PREFILLED_SYRINGE | INTRAVENOUS | Status: DC | PRN
  Administered 2018-01-28: 50 mg via INTRAVENOUS

## 2018-01-28 MED ORDER — ONDANSETRON HCL 4 MG PO TABS: 4.0000 mg | ORAL_TABLET | Freq: Four times a day (QID) | ORAL | Status: DC | PRN

## 2018-01-28 MED ORDER — ONDANSETRON HCL 4 MG/2ML IJ SOLN: 4.0000 mg | Freq: Four times a day (QID) | INTRAMUSCULAR | Status: DC | PRN

## 2018-01-28 MED ORDER — LIDOCAINE 2% (20 MG/ML) 5 ML SYRINGE
INTRAMUSCULAR | Status: AC
  Filled 2018-01-28: qty 5

## 2018-01-28 MED ORDER — DEXAMETHASONE SODIUM PHOSPHATE 10 MG/ML IJ SOLN
INTRAMUSCULAR | Status: DC | PRN
  Administered 2018-01-28: 10 mg via INTRAVENOUS

## 2018-01-28 MED ORDER — DEXAMETHASONE 0.5 MG/5ML PO SOLN
1.0000 mg | Freq: Four times a day (QID) | ORAL | Status: DC
  Filled 2018-01-28 (×6): qty 10

## 2018-01-28 MED ORDER — ACETAMINOPHEN 500 MG PO TABS
500.0000 mg | ORAL_TABLET | Freq: Four times a day (QID) | ORAL | Status: AC
  Administered 2018-01-28 – 2018-01-29 (×4): 500 mg via ORAL
  Filled 2018-01-28 (×4): qty 1

## 2018-01-28 MED ORDER — ENOXAPARIN SODIUM 40 MG/0.4ML ~~LOC~~ SOLN
40.0000 mg | SUBCUTANEOUS | Status: DC
  Administered 2018-01-29: 40 mg via SUBCUTANEOUS
  Filled 2018-01-28: qty 0.4

## 2018-01-28 MED ORDER — SUGAMMADEX SODIUM 200 MG/2ML IV SOLN
INTRAVENOUS | Status: DC | PRN
  Administered 2018-01-28: 200 mg via INTRAVENOUS

## 2018-01-28 MED ORDER — SCOPOLAMINE 1 MG/3DAYS TD PT72: 1.0000 | MEDICATED_PATCH | Freq: Once | TRANSDERMAL | Status: DC

## 2018-01-28 MED ORDER — FENTANYL CITRATE (PF) 250 MCG/5ML IJ SOLN
INTRAMUSCULAR | Status: AC
  Filled 2018-01-28: qty 5

## 2018-01-28 MED ORDER — KETOROLAC TROMETHAMINE 15 MG/ML IJ SOLN
15.0000 mg | Freq: Four times a day (QID) | INTRAMUSCULAR | Status: AC
  Administered 2018-01-28 – 2018-01-29 (×4): 15 mg via INTRAVENOUS
  Filled 2018-01-28 (×4): qty 1

## 2018-01-28 MED ORDER — FENTANYL CITRATE (PF) 100 MCG/2ML IJ SOLN
25.0000 ug | INTRAMUSCULAR | Status: DC | PRN
  Administered 2018-01-28 (×4): 25 ug via INTRAVENOUS

## 2018-01-28 MED ORDER — FENTANYL CITRATE (PF) 100 MCG/2ML IJ SOLN
INTRAMUSCULAR | Status: AC
  Administered 2018-01-28: 25 ug via INTRAVENOUS
  Filled 2018-01-28: qty 2

## 2018-01-28 MED ORDER — LACTATED RINGERS IV SOLN
INTRAVENOUS | Status: DC | PRN
  Administered 2018-01-28: 08:00:00 via INTRAVENOUS

## 2018-01-28 MED ORDER — HYDROCODONE-ACETAMINOPHEN 5-325 MG PO TABS
1.0000 | ORAL_TABLET | ORAL | Status: DC | PRN
  Administered 2018-01-28 – 2018-01-29 (×2): 1 via ORAL
  Filled 2018-01-28: qty 1

## 2018-01-28 MED ORDER — ANASTROZOLE 1 MG PO TABS
1.0000 mg | ORAL_TABLET | Freq: Every day | ORAL | Status: DC
  Administered 2018-01-28 – 2018-01-29 (×2): 1 mg via ORAL
  Filled 2018-01-28 (×2): qty 1

## 2018-01-28 MED ORDER — LORATADINE 10 MG PO TABS
10.0000 mg | ORAL_TABLET | Freq: Every day | ORAL | Status: DC
  Administered 2018-01-28 – 2018-01-29 (×2): 10 mg via ORAL
  Filled 2018-01-28 (×2): qty 1

## 2018-01-28 MED ORDER — ONDANSETRON HCL 4 MG/2ML IJ SOLN
INTRAMUSCULAR | Status: DC | PRN
  Administered 2018-01-28: 4 mg via INTRAVENOUS

## 2018-01-28 MED ORDER — METHOCARBAMOL 1000 MG/10ML IJ SOLN
500.0000 mg | Freq: Four times a day (QID) | INTRAVENOUS | Status: DC | PRN
  Filled 2018-01-28: qty 5

## 2018-01-28 MED ORDER — LABETALOL HCL 5 MG/ML IV SOLN
INTRAVENOUS | Status: DC | PRN
  Administered 2018-01-28: 5 mg via INTRAVENOUS

## 2018-01-28 MED ORDER — LIDOCAINE 2% (20 MG/ML) 5 ML SYRINGE
INTRAMUSCULAR | Status: DC | PRN
  Administered 2018-01-28: 100 mg via INTRAVENOUS

## 2018-01-28 MED ORDER — MORPHINE SULFATE (PF) 2 MG/ML IV SOLN: 0.5000 mg | INTRAVENOUS | Status: DC | PRN

## 2018-01-28 MED ORDER — GABAPENTIN 300 MG PO CAPS
300.0000 mg | ORAL_CAPSULE | Freq: Once | ORAL | Status: AC
  Administered 2018-01-28: 300 mg via ORAL
  Filled 2018-01-28: qty 1

## 2018-01-28 MED ORDER — PROPOFOL 10 MG/ML IV BOLUS
INTRAVENOUS | Status: AC
  Filled 2018-01-28: qty 20

## 2018-01-28 MED ORDER — PROPOFOL 10 MG/ML IV BOLUS
INTRAVENOUS | Status: DC | PRN
  Administered 2018-01-28: 200 mg via INTRAVENOUS

## 2018-01-28 MED ORDER — LEVOTHYROXINE SODIUM 25 MCG PO TABS
137.0000 ug | ORAL_TABLET | Freq: Every day | ORAL | Status: DC
  Administered 2018-01-29: 137 ug via ORAL
  Filled 2018-01-28 (×2): qty 1

## 2018-01-28 MED ORDER — FENTANYL CITRATE (PF) 100 MCG/2ML IJ SOLN
INTRAMUSCULAR | Status: DC | PRN
  Administered 2018-01-28 (×3): 50 ug via INTRAVENOUS
  Administered 2018-01-28: 100 ug via INTRAVENOUS

## 2018-01-28 MED ORDER — HYDROCODONE-ACETAMINOPHEN 7.5-325 MG PO TABS: 1.0000 | ORAL_TABLET | ORAL | Status: DC | PRN

## 2018-01-28 MED ORDER — ACETAMINOPHEN 500 MG PO TABS
1000.0000 mg | ORAL_TABLET | Freq: Once | ORAL | Status: AC
  Administered 2018-01-28: 1000 mg via ORAL
  Filled 2018-01-28: qty 2

## 2018-01-28 MED ORDER — METHOCARBAMOL 500 MG PO TABS
500.0000 mg | ORAL_TABLET | Freq: Four times a day (QID) | ORAL | Status: DC | PRN
  Administered 2018-01-28: 500 mg via ORAL
  Filled 2018-01-28: qty 1

## 2018-01-28 MED ORDER — ONDANSETRON HCL 4 MG/2ML IJ SOLN
INTRAMUSCULAR | Status: AC
  Filled 2018-01-28: qty 4

## 2018-01-28 SURGICAL SUPPLY — 45 items
ALCOHOL 70% 16 OZ (MISCELLANEOUS) ×1 IMPLANT
BIT DRILL SHORT 4.2 (BIT) IMPLANT
BNDG COHESIVE 4X5 TAN STRL (GAUZE/BANDAGES/DRESSINGS) ×3 IMPLANT
BNDG COHESIVE 6X5 TAN STRL LF (GAUZE/BANDAGES/DRESSINGS) ×3 IMPLANT
COVER MAYO STAND STRL (DRAPES) ×4 IMPLANT
COVER PERINEAL POST (MISCELLANEOUS) ×3 IMPLANT
COVER SURGICAL LIGHT HANDLE (MISCELLANEOUS) ×3 IMPLANT
DRAPE C-ARM 42X72 X-RAY (DRAPES) ×2 IMPLANT
DRAPE C-ARMOR (DRAPES) IMPLANT
DRAPE HALF SHEET 40X57 (DRAPES) IMPLANT
DRAPE INCISE IOBAN 66X45 STRL (DRAPES) ×2 IMPLANT
DRAPE ORTHO SPLIT 77X108 STRL (DRAPES)
DRAPE STERI IOBAN 125X83 (DRAPES) ×9 IMPLANT
DRAPE SURG ORHT 6 SPLT 77X108 (DRAPES) IMPLANT
DRILL BIT SHORT 4.2 (BIT) ×3
DRSG TEGADERM 2-3/8X2-3/4 SM (GAUZE/BANDAGES/DRESSINGS) IMPLANT
DRSG TEGADERM 4X4.75 (GAUZE/BANDAGES/DRESSINGS) ×2 IMPLANT
DURAPREP 26ML APPLICATOR (WOUND CARE) ×3 IMPLANT
ELECT REM PT RETURN 9FT ADLT (ELECTROSURGICAL) ×3
ELECTRODE REM PT RTRN 9FT ADLT (ELECTROSURGICAL) ×1 IMPLANT
FACESHIELD WRAPAROUND (MASK) IMPLANT
FACESHIELD WRAPAROUND OR TEAM (MASK) IMPLANT
GAUZE SPONGE 4X4 12PLY STRL LF (GAUZE/BANDAGES/DRESSINGS) ×3 IMPLANT
GAUZE XEROFORM 1X8 LF (GAUZE/BANDAGES/DRESSINGS) ×3 IMPLANT
GLOVE BIO SURGEON STRL SZ7.5 (GLOVE) ×3 IMPLANT
GLOVE BIOGEL PI IND STRL 8 (GLOVE) ×1 IMPLANT
GLOVE BIOGEL PI INDICATOR 8 (GLOVE) ×2
GOWN STRL REUS W/ TWL LRG LVL3 (GOWN DISPOSABLE) ×2 IMPLANT
GOWN STRL REUS W/ TWL XL LVL3 (GOWN DISPOSABLE) ×1 IMPLANT
GOWN STRL REUS W/TWL LRG LVL3 (GOWN DISPOSABLE) ×6
GOWN STRL REUS W/TWL XL LVL3 (GOWN DISPOSABLE) ×3
GUIDEWIRE 3.2X400 (WIRE) ×4 IMPLANT
KIT BASIN OR (CUSTOM PROCEDURE TRAY) ×3 IMPLANT
KIT TURNOVER KIT B (KITS) ×3 IMPLANT
MANIFOLD NEPTUNE II (INSTRUMENTS) ×1 IMPLANT
NAIL CANN TFNA 9 130D 380 (Nail) ×2 IMPLANT
NS IRRIG 1000ML POUR BTL (IV SOLUTION) ×3 IMPLANT
PACK GENERAL/GYN (CUSTOM PROCEDURE TRAY) ×3 IMPLANT
PAD ARMBOARD 7.5X6 YLW CONV (MISCELLANEOUS) ×6 IMPLANT
REAMER ROD DEEP FLUTE 2.5X950 (INSTRUMENTS) ×2 IMPLANT
SCREW LOCKING 5.0MMX40MM (Screw) ×2 IMPLANT
SCREW TFNA P5MM STERILE (Screw) ×2 IMPLANT
STAPLER VISISTAT 35W (STAPLE) ×3 IMPLANT
SUT VIC AB 2-0 CT1 27 (SUTURE) ×6
SUT VIC AB 2-0 CT1 TAPERPNT 27 (SUTURE) ×2 IMPLANT

## 2018-01-28 NOTE — Brief Op Note (Signed)
01/28/2018  9:13 AM  PATIENT:  Danella Penton  52 y.o. female  PRE-OPERATIVE DIAGNOSIS:  left femur impending fracture  POST-OPERATIVE DIAGNOSIS:  left femur impending fracture  PROCEDURE:  Procedure(s): INTRAMEDULLARY (IM) NAIL FEMORAL (Left)  SURGEON:  Surgeon(s) and Role:    * Nicholes Stairs, MD - Primary  PHYSICIAN ASSISTANT:   ASSISTANTS: none   ANESTHESIA:   general  EBL:  75 cc  BLOOD ADMINISTERED:none  DRAINS: none   LOCAL MEDICATIONS USED:  NONE  SPECIMEN:  No Specimen  DISPOSITION OF SPECIMEN:  N/A  COUNTS:  YES  TOURNIQUET:  * No tourniquets in log *  DICTATION: .Note written in EPIC  PLAN OF CARE: Admit to inpatient   PATIENT DISPOSITION:  PACU - hemodynamically stable.   Delay start of Pharmacological VTE agent (>24hrs) due to surgical blood loss or risk of bleeding: not applicable

## 2018-01-28 NOTE — Discharge Instructions (Signed)
Okay for weightbearing as tolerated to the left femur. -For mild to moderate pain use Tylenol and/or ibuprofen.  For breakthrough pain use oxycodone. -You may maintain your postoperative bandages until your follow-up appointment with Dr. Stann Mainland.  You may shower with these in place but do not submerge underwater.  If they become saturated or soiled please replace with daily dry dressings. -For the prevention of blood clots administer a Lovenox injection once daily for 4 weeks. -Return to see Dr. Stann Mainland in 2 weeks for wound check.

## 2018-01-28 NOTE — Op Note (Signed)
Date of Surgery: 01/28/2018  INDICATIONS: Debbie [name] is a 52 y.o.-year-old female who sustained a left hip impending pathologic fracture for metastatic breast carcinoma. The risks and benefits of the procedure discussed with the patient prior to the procedure and all questions were answered; consent was obtained.  PREOPERATIVE DIAGNOSIS: left hip impending pathologic fracture , subtrochanteric  POSTOPERATIVE DIAGNOSIS: Same   PROCEDURE: Treatment of intertrochanteric, pertrochanteric, subtrochanteric fracture with intramedullary implant. CPT [phone]   SURGEON: Dannielle Karvonen. Stann Mainland, M.D.   ANESTHESIA: general   IV FLUIDS AND URINE: See anesthesia record   ESTIMATED BLOOD LOSS: 75 cc  IMPLANTS:   Synthes TFNA 9 mm x 380 mm 95 mm proximal compression screw 5.0 x 40 mm distal interlock  DRAINS: None.   COMPLICATIONS: None.   DESCRIPTION OF PROCEDURE: The patient was brought to the operating room and placed supine on the operating table. The patient's leg had been signed prior to the procedure. The patient had the anesthesia placed by the anesthesiologist. The prep verification and incision time-outs were performed to confirm that this was the correct patient, site, side and location. The patient had an SCD on the opposite lower extremity. The patient did receive antibiotics prior to the incision and was re-dosed during the procedure as needed at indicated intervals. The patient was positioned on the fracture table with the table in traction and internal rotation to reduce the hip. The well leg was placed in a scissor position and all bony prominences were well-padded. The patient had the lower extremity prepped and draped in the standard surgical fashion. The incision was made 4 finger breadths superior to the greater trochanter. A guide pin was inserted into the tip of the greater trochanter under fluoroscopic guidance. An opening reamer was used to gain access to the femoral canal. The nail  length was measured and inserted down the femoral canal to its proper depth. The appropriate version of insertion for the lag screw was found under fluoroscopy. A pin was inserted up the femoral neck through the jig. The length of the lag screw was then measured. The lag screw was inserted as near to center-center in the head as possible. The leg was taken out of traction, then the compression screw was used to compress across the fracture. Compression was visualized on serial xrays.   We next turned our attention to the distal interlocking screw.  Using perfect circle technique we drilled through one of the distal screw holes.  A small incision was made overlying the lateral thigh at the screw site, and a tonsil was used to disect down to bone.  A drill pass was made through the nail through both cortices.  This was measured, and the appropriate screw was placed under hand power and found to have good bite.    The wound was copiously irrigated with saline and the subcutaneous layer closed with 2.0 vicryl and the skin was reapproximated with staples. The wounds were cleaned and dried a final time and a sterile dressing was placed. The hip was taken through a range of motion at the end of the case under fluoroscopic imaging to visualize the approach-withdraw phenomenon and confirm implant length in the head. The patient was then awakened from anesthesia and taken to the recovery room in stable condition. All counts were correct at the end of the case.   POSTOPERATIVE PLAN: The patient will be weight bearing as tolerated and will return in 2 weeks for staple removal and the patient will receive  DVT prophylaxis based on other medications, activity level, and risk ratio of bleeding to thrombosis.     Geralynn Rile, MD [facility] [phone] 9:16 AM

## 2018-01-28 NOTE — Anesthesia Procedure Notes (Signed)
Procedure Name: Intubation Date/Time: 01/28/2018 7:56 AM Performed by: Scheryl Darter, CRNA Pre-anesthesia Checklist: Patient identified, Emergency Drugs available, Suction available and Patient being monitored Patient Re-evaluated:Patient Re-evaluated prior to induction Oxygen Delivery Method: Circle System Utilized Preoxygenation: Pre-oxygenation with 100% oxygen Induction Type: IV induction Ventilation: Mask ventilation without difficulty and Oral airway inserted - appropriate to patient size Laryngoscope Size: Mac and 3 Grade View: Grade I Tube type: Oral Number of attempts: 1 Airway Equipment and Method: Stylet and Oral airway Placement Confirmation: ETT inserted through vocal cords under direct vision,  positive ETCO2 and breath sounds checked- equal and bilateral Secured at: 23 cm Tube secured with: Tape Dental Injury: Teeth and Oropharynx as per pre-operative assessment  Comments: Intubated by [facility]

## 2018-01-28 NOTE — Anesthesia Preprocedure Evaluation (Addendum)
Anesthesia Evaluation  Patient identified by MRN, date of birth, ID band Patient awake    Reviewed: Allergy & Precautions, NPO status , Patient's Chart, lab work & pertinent test results  History of Anesthesia Complications (+) PONV, Family history of anesthesia reaction and history of anesthetic complications  Airway Mallampati: II  TM Distance: >3 FB Neck ROM: Full    Dental  (+) Dental Advisory Given, Partial Lower, Upper Dentures   Pulmonary neg pulmonary ROS,    Pulmonary exam normal breath sounds clear to auscultation       Cardiovascular hypertension, Pt. on medications Normal cardiovascular exam Rhythm:Regular Rate:Normal     Neuro/Psych  Headaches, PSYCHIATRIC DISORDERS Anxiety Depression    GI/Hepatic negative GI ROS, Neg liver ROS, Liver cancer   Endo/Other  Hypothyroidism Morbid obesity  Renal/GU negative Renal ROS     Musculoskeletal  (+) Arthritis ,   Abdominal   Peds  Hematology negative hematology ROS (+)   Anesthesia Other Findings Day of surgery medications reviewed with the patient.  Reproductive/Obstetrics                             Anesthesia Physical Anesthesia Plan  ASA: IV  Anesthesia Plan: General   Post-op Pain Management:    Induction: Intravenous  PONV Risk Score and Plan: 4 or greater and Midazolam, Ondansetron, Dexamethasone, Scopolamine patch - Pre-op and Diphenhydramine  Airway Management Planned: Oral ETT  Additional Equipment:   Intra-op Plan:   Post-operative Plan: Extubation in OR  Informed Consent: I have reviewed the patients History and Physical, chart, labs and discussed the procedure including the risks, benefits and alternatives for the proposed anesthesia with the patient or authorized representative who has indicated his/her understanding and acceptance.   Dental advisory given  Plan Discussed with: CRNA  Anesthesia Plan  Comments:        Anesthesia Quick Evaluation

## 2018-01-28 NOTE — Progress Notes (Signed)
1035 Received pt from PACU, A&O x4. Left hip incisions with Mipilex dressing dry and intact. C/o left hip pain, medicated.

## 2018-01-28 NOTE — Transfer of Care (Signed)
Immediate Anesthesia Transfer of Care Note  Patient: Debbie [name]  Procedure(s) Performed: INTRAMEDULLARY (IM) NAIL FEMORAL (Left Leg Upper)  Patient Location: PACU  Anesthesia Type:General  Level of Consciousness: awake, alert , oriented and sedated  Airway & Oxygen Therapy: Patient Spontanous Breathing and Patient connected to face mask oxygen  Post-op Assessment: Report given to RN, Post -op Vital signs reviewed and stable and Patient moving all extremities  Post vital signs: Reviewed and stable  Last Vitals:  Vitals Value Taken Time  BP 126/60 01/28/2018  9:22 AM  Temp    Pulse 87 01/28/2018  9:24 AM  Resp 16 01/28/2018  9:24 AM  SpO2 91 % 01/28/2018  9:24 AM  Vitals shown include unvalidated device data.  Last Pain:  Vitals:   01/28/18 0920  TempSrc:   PainSc: (P) 0-No pain         Complications: No apparent anesthesia complications

## 2018-01-28 NOTE — Anesthesia Postprocedure Evaluation (Signed)
Anesthesia Post Note  Patient: Debbie [name]  Procedure(s) Performed: INTRAMEDULLARY (IM) NAIL FEMORAL (Left Leg Upper)     Patient location during evaluation: PACU Anesthesia Type: General Level of consciousness: awake and alert, oriented and awake Pain management: pain level controlled Vital Signs Assessment: post-procedure vital signs reviewed and stable Respiratory status: spontaneous breathing, nonlabored ventilation and respiratory function stable Cardiovascular status: blood pressure returned to baseline and stable Postop Assessment: no apparent nausea or vomiting Anesthetic complications: no    Last Vitals:  Vitals:   01/28/18 1012 01/28/18 1100  BP: (!) 158/78 (!) 156/87  Pulse: 86 88  Resp: 15 16  Temp: 36.4 C 36.8 C  SpO2: 98% 97%    Last Pain:  Vitals:   01/28/18 1100  TempSrc: Oral  PainSc:                  Catalina Gravel

## 2018-01-28 NOTE — H&P (Signed)
ORTHOPAEDIC H and P  REQUESTING PHYSICIAN: Nicholes Stairs, MD  PCP:  Kelton Pillar, MD  Chief Complaint: Left femur impending pathologic fracture  HPI: Debbie [name] is a 52 y.o. female who complains of increasing left hip pain over the last couple months.  She has known metastatic breast cancer with bone metastases.  She has had increased pain with weightbearing at the left hip.  On CT and x-ray of that left femur we noted increasing lytic lesion in the proximal femur.  She presents today for prophylactic long intramedullary nailing of the left femur.  She has no new complaints at this time.  We have previously discussed this recommendation in my office last week.  Past Medical History:  Diagnosis Date  . Anxiety   . Arthritis   . Bone metastasis ([place])   . Breast cancer of upper-inner quadrant of right female breast [facility]) oncologist-  dr [name]--- 04/ 2019 ER/PR+ Stage IV w/ metastatic disease to liver, total spine, lung, bone   dx 06/ 2017  right breast invasive ductal carcinoma, Stage IIB, ER/PR + (pT2 pN1), grade 1-- s/p  right breast lumpecotmy w/ sln bx's and bilateral breast reduction 11-28-2015/  left breast with reduction , dx invasive ductal ca, Grade 1 (pT1cpNX) ER/PR positive/  adjuvant radiation completed 03-27-2016 to right chest wall, 2018  reclassifiec Stage IIA  . Cancer, metastatic to liver ([place])   . Chronic back pain   . Constipation   . Depression   . Family history of adverse reaction to anesthesia     mother has Copd was kept on vent   . Headache   . Heart murmur   . History of hyperthyroidism    s/p  RAI  . History of radiation therapy 02/11/16- 03/27/16   Right Chest Wall 50.4 Gy in 28 fractions, Right Chest Wall Boost 10 Gy in 5 fractions.   . Hyperlipidemia   . Hypertension   . Hypothyroidism, postradioiodine therapy    endocrinoloigst-  dr balan--  2004  s/p  RAI  . Menorrhagia 2011  . Multiple pulmonary nodules    secondary   metastatic  . PONV (postoperative nausea and vomiting)    patch helps   . SUI (stress urinary incontinence, female)   . Wears contact lenses   . Wears dentures    full upper and lower partial   Past Surgical History:  Procedure Laterality Date  . BREAST LUMPECTOMY WITH NEEDLE LOCALIZATION AND AXILLARY SENTINEL LYMPH NODE BX Right 11/28/2015   Procedure: RIGHT BREAST LUMPECTOMY WITH NEEDLE LOCALIZATION AND AXILLARY SENTINEL LYMPH NODE BX;  Surgeon: Autumn Messing III, MD;  Location: [place];  Service: General;  Laterality: Right;  . BREAST REDUCTION SURGERY Bilateral 11/28/2015   Procedure: MAMMARY REDUCTION  (BREAST) BILATERAL;  Surgeon: Crissie Reese, MD;  Location: [place];  Service: Plastics;  Laterality: Bilateral;  . CESAREAN SECTION  01/12/1993  . COLONOSCOPY    . Dental surgeries    . HYSTEROSCOPY N/A 09/02/2017   Procedure: HYSTEROSCOPY  to remove IUD;  Surgeon: Eldred Manges, MD;  Location: [facility];  Service: Gynecology;  Laterality: N/A;  . Liver biospy    . MASTECTOMY    . MASTECTOMY W/ SENTINEL NODE BIOPSY Bilateral 12/26/2015   Procedure: BILATERAL MASTECTOMY WITH LEFT SENTINEL LYMPH NODE BIOPSY;  Surgeon: Autumn Messing III, MD;  Location: [place];  Service: General;  Laterality: Bilateral;  . Removal of IUD    . TUBAL LIGATION Bilateral 1995  Social History   Socioeconomic History  . Marital status: Married    Spouse name: Not on file  . Number of children: Not on file  . Years of education: Not on file  . Highest education level: Not on file  Occupational History  . Not on file  Social Needs  . Financial resource strain: Not on file  . Food insecurity:    Worry: Not on file    Inability: Not on file  . Transportation needs:    Medical: Not on file    Non-medical: Not on file  Tobacco Use  . Smoking status: Never Smoker  . Smokeless tobacco: Never Used  Substance and Sexual Activity  . Alcohol use: No  . Drug use: No  . Sexual activity: Not on file     Comment: BTL   Lifestyle  . Physical activity:    Days per week: Not on file    Minutes per session: Not on file  . Stress: Not on file  Relationships  . Social connections:    Talks on phone: Not on file    Gets together: Not on file    Attends religious service: Not on file    Active member of club or organization: Not on file    Attends meetings of clubs or organizations: Not on file    Relationship status: Not on file  Other Topics Concern  . Not on file  Social History Narrative  . Not on file   Family History  Problem Relation Age of Onset  . Diabetes Mother   . Hypertension Mother   . Hypertension Father   . Diabetes Maternal Grandmother   . Hypertension Sister   . Cancer Paternal Grandmother        colon  . Colon cancer Paternal Grandmother   . Esophageal cancer Neg Hx   . Stomach cancer Neg Hx   . Rectal cancer Neg Hx    Allergies  Allergen Reactions  . Ace Inhibitors Cough  . Atorvastatin Other (See Comments)    Joint pain  . Simvastatin Other (See Comments)    Joint pain   Prior to Admission medications   Medication Sig Start Date End Date Taking? Authorizing Provider  anastrozole (ARIMIDEX) 1 MG tablet Take 1 tablet (1 mg total) by mouth daily. 01/04/18  Yes [name], Virgie Dad, MD  Calcium-Magnesium 250-125 MG TABS Take 1 tablet by mouth daily. 04/09/16  Yes [name], Virgie Dad, MD  cetirizine (ZYRTEC ALLERGY) 10 MG tablet Take 10 mg by mouth daily as needed for allergies.    Yes [provider]  dexamethasone (DECADRON) 0.5 MG/5ML solution Swish 53mL in mouth for 85min and spit out. Use 4 times daily for 8 weeks, start with Afinitor. Avoid eating/drinking for 1hr after rinse. Patient taking differently: Take 1 mg by mouth 4 (four) times daily. Swish 55mL in mouth for 59min and spit out. Use 4 times daily for 8 weeks, start with Afinitor. Avoid eating/drinking for 1hr after rinse. 01/04/18  Yes [name], Virgie Dad, MD  docusate sodium (COLACE) 100 MG  capsule Take 200-400 mg by mouth daily.   Yes [provider]  everolimus (AFINITOR) 10 MG tablet Take 1 tablet (10 mg total) by mouth daily. Take at approximately the same time each day with a glass of water. 01/04/18  Yes [name], Virgie Dad, MD  hydrochlorothiazide (MICROZIDE) 12.5 MG capsule Take 12.5 mg by mouth every morning.    Yes [provider]  hydrocortisone cream 1 %  Apply 1 application topically daily as needed (for rash).   Yes [provider]  levothyroxine (SYNTHROID, LEVOTHROID) 137 MCG tablet Take 137 mcg by mouth daily before breakfast.    Yes [provider]  LIVALO 2 MG TABS Take 2 mg by mouth daily.  08/29/15  Yes [provider]  losartan (COZAAR) 100 MG tablet Take 100 mg by mouth every morning.    Yes [provider]  Multiple Vitamin (MULTIVITAMIN) tablet Take 1 tablet by mouth daily.   Yes [provider]  omega-3 acid ethyl esters (LOVAZA) 1 g capsule Take 1 g by mouth 2 (two) times daily.    Yes [provider]  potassium chloride SA (K-DUR,KLOR-CON) 20 MEQ tablet Take 40 mEq by mouth daily.    Yes [provider]  Vitamin D, Cholecalciferol, 1000 units CAPS Take 1 tablet by mouth daily. Patient taking differently: Take 1,000 Units by mouth daily.  04/09/16  Yes [name], Virgie Dad, MD  acetaminophen (TYLENOL) 325 MG tablet Take 325 mg by mouth every 6 (six) hours as needed for mild pain or headache.     [provider]  ibuprofen (ADVIL,MOTRIN) 600 MG tablet Take 1 tablet (600 mg total) by mouth every 6 (six) hours as needed for moderate pain. 09/02/17   Haygood, Seymour Bars, MD  prochlorperazine (COMPAZINE) 10 MG tablet Take 1 tablet (10 mg total) by mouth every 6 (six) hours as needed for nausea or vomiting. 01/13/18   [name], Virgie Dad, MD  traMADol (ULTRAM) 50 MG tablet Take 1 tablet (50 mg total) by mouth every 6 (six) hours as needed. Patient not taking: Reported on 01/24/2018 08/13/17    [name], Virgie Dad, MD   No results found.  Positive ROS: All other systems have been reviewed and were otherwise negative with the exception of those mentioned in the HPI and as above.  Physical Exam: General: Alert, no acute distress Cardiovascular: No pedal edema Respiratory: No cyanosis, no use of accessory musculature GI: No organomegaly, abdomen is soft and non-tender Skin: No lesions in the area of chief complaint Neurologic: Sensation intact distally Psychiatric: Patient is competent for consent with normal mood and affect Lymphatic: No axillary or cervical lymphadenopathy    Assessment: Closed left femur pathologic fracture. Metastatic breast carcinoma bone  Plan: -Plan for a long intramedullary nail today for prophylactic fixation of impending fracture to the left femur. - The risks, benefits, and alternatives were discussed with the patient. There are risks associated with the surgery including, but not limited to, problems with anesthesia (death), infection, differences in leg length/angulation/rotation, fracture of bones, loosening or failure of implants, malunion, nonunion, hematoma (blood accumulation) which may require surgical drainage, blood clots, pulmonary embolism, nerve injury (foot drop), and blood vessel injury. The patient understands these risks and elects to proceed. =-We will plan to admit her postoperatively for routine postoperative monitoring.  I anticipate she will be discharged home tomorrow morning.  She will be weightbearing as tolerated on the left lower extremity.    Nicholes Stairs, MD Cell [phone]    01/28/2018 7:05 AM

## 2018-01-28 NOTE — Plan of Care (Signed)
  Problem: Activity: Goal: Risk for activity intolerance will decrease Outcome: Progressing   Problem: Coping: Goal: Level of anxiety will decrease Outcome: Progressing   Problem: Elimination: Goal: Will not experience complications related to urinary retention Outcome: Progressing   Problem: Pain Managment: Goal: General experience of comfort will improve Outcome: Progressing   Problem: Safety: Goal: Ability to remain free from injury will improve Outcome: Progressing   Problem: Skin Integrity: Goal: Risk for impaired skin integrity will decrease Outcome: Progressing   Problem: Coping: Goal: Level of anxiety will decrease Outcome: Progressing   Problem: Elimination: Goal: Will not experience complications related to urinary retention Outcome: Progressing   Problem: Pain Managment: Goal: General experience of comfort will improve Outcome: Progressing   Problem: Safety: Goal: Ability to remain free from injury will improve Outcome: Progressing   Problem: Skin Integrity: Goal: Risk for impaired skin integrity will decrease Outcome: Progressing

## 2018-01-29 MED ORDER — OXYCODONE HCL 5 MG PO TABS: 5.0000 mg | ORAL_TABLET | Freq: Four times a day (QID) | ORAL | 0 refills | Status: DC | PRN

## 2018-01-29 NOTE — Progress Notes (Signed)
   Subjective:  Patient reports pain as mild.  Doing quite well this morning with no specific complaints.  She feels able to return home later today.  She denies shortness of breath or chest pain or nausea or vomiting.  Objective:   VITALS:   Vitals:   01/28/18 1834 01/28/18 1938 01/29/18 0033 01/29/18 0420  BP: 130/64 (!) 104/59 118/65 113/77  Pulse: 93 93 86 73  Resp: 20     Temp: 98.9 F (37.2 C) 98.2 F (36.8 C) 98.5 F (36.9 C) 98.1 F (36.7 C)  TempSrc: Oral Oral Oral Oral  SpO2: 93% 98% 96% 99%  Weight:      Height:        Neurovascular intact Intact pulses distally Dorsiflexion/Plantar flexion intact Incision: dressing C/D/I Compartment soft   Lab Results  Component Value Date   WBC 4.8 01/28/2018   HGB 13.8 01/28/2018   HCT 41.3 01/28/2018   MCV 103.0 (H) 01/28/2018   PLT 308 01/28/2018   BMET    Component Value Date/Time   NA 140 01/28/2018 0642   NA 140 07/08/2016 0948   K 3.0 (L) 01/28/2018 0642   K 4.1 07/08/2016 0948   CL 101 01/28/2018 0642   CO2 28 01/28/2018 0642   CO2 24 07/08/2016 0948   GLUCOSE 100 (H) 01/28/2018 0642   GLUCOSE 89 07/08/2016 0948   BUN 11 01/28/2018 0642   BUN 11.9 07/08/2016 0948   CREATININE 0.83 01/28/2018 0642   CREATININE 0.90 06/22/2017 1147   CREATININE 0.9 07/08/2016 0948   CALCIUM 9.0 01/28/2018 0642   CALCIUM 10.0 07/08/2016 0948   GFRNONAA >60 01/28/2018 0642   GFRNONAA >60 06/22/2017 1147   GFRAA >60 01/28/2018 0642   GFRAA >60 06/22/2017 1147     Assessment/Plan: 1 Day Post-Op   Principal Problem:   Pathological fracture in neoplastic disease, left femur, initial encounter for fracture [facility]) Active Problems:   Bone metastases ([facility])   Advance diet Up with therapy D/C IV fluids -Continue full weightbearing as tolerated left lower extremity. -Discharge home later today. -Maintain postoperative bandages until follow-up in 2 weeks with Dr. Stann Mainland. -Twice daily aspirin for DVT  prophylaxis.   Nicholes Stairs 01/29/2018, 9:22 AM   Geralynn Rile, MD [phone]

## 2018-01-29 NOTE — Plan of Care (Signed)

## 2018-01-29 NOTE — Progress Notes (Signed)
Patient discharging today. Discharge instructions explained to patient and she verbalized understanding. Took all personal belongings. No further questions or concerns voiced.

## 2018-01-29 NOTE — Evaluation (Addendum)
Physical Therapy Evaluation and Discharge Patient Details Name: Debbie [name] MRN: [id number] DOB: 1965/06/22 Today's Date: 01/29/2018   History of Present Illness  Pt is a 52 y.o. female who complains of increasing left hip pain over the last couple months. She has known metastatic breast cancer with bone metastases. CT and x-ray of the left femur revealed increasing lytic lesion in the proximal femur.  She presented on 01/28/18 for prophylactic long intramedullary nailing of the left femur.   Clinical Impression  Patient evaluated by Physical Therapy with no further acute PT needs identified. All education has been completed and the patient has no further questions. At the time of PT eval pt reports minimal pain, even with OOB mobility. She progressed to modified independence for transfers, ambulation, and stair negotiation by end of session. She anticipates d/c home this afternoon and has no further concerns. See below for any follow-up Physical Therapy or equipment needs. PT is signing off. Thank you for this referral.     Follow Up Recommendations Outpatient PT;Supervision for mobility/OOB    Equipment Recommendations  None recommended by PT    Recommendations for Other Services       Precautions / Restrictions Precautions Precautions: None Restrictions Weight Bearing Restrictions: Yes LLE Weight Bearing: Weight bearing as tolerated      Mobility  Bed Mobility Overal bed mobility: Modified Independent Bed Mobility: Supine to Sit           General bed mobility comments: Use of rails required.  Transfers Overall transfer level: Modified independent Equipment used: Rolling walker (2 wheeled);Straight cane             General transfer comment: Initially with walker progressing to [facility]. No assist required. Pt demonstrated proper hand placement on seated surface for safety.   Ambulation/Gait Ambulation/Gait assistance: Modified independent (Device/Increase  time) Gait Distance (Feet): 450 Feet Assistive device: Rolling walker (2 wheeled);Straight cane Gait Pattern/deviations: Step-through pattern;Decreased stride length;Trendelenburg Gait velocity: Decreased Gait velocity interpretation: 1.31 - 2.62 ft/sec, indicative of limited community ambulator General Gait Details: Pt ambulated to therapy gym with RW, and back with SPC. With [facility] she was able to manage her IV pole as well. No unsteadiness or LOB noted.   Stairs Stairs: Yes Stairs assistance: Supervision;Modified independent (Device/Increase time) Stair Management: Two rails;Step to pattern;Forwards Number of Stairs: 5 General stair comments: Initially with supervision however progressed to mod I by end of session. VC's for sequencing and general safety initially.   Wheelchair Mobility    Modified Rankin (Stroke Patients Only)       Balance Overall balance assessment: Needs assistance Sitting-balance support: Feet supported;No upper extremity supported Sitting balance-Leahy Scale: Good     Standing balance support: No upper extremity supported;During functional activity Standing balance-Leahy Scale: Fair Standing balance comment: Statically                             Pertinent Vitals/Pain Pain Assessment: Faces Faces Pain Scale: Hurts a little bit Pain Location: L hip Pain Descriptors / Indicators: Operative site guarding Pain Intervention(s): Monitored during session;Repositioned    Home Living Family/patient expects to be discharged to:: Private residence Living Arrangements: Spouse/significant other Available Help at Discharge: Family Type of Home: House Home Access: Stairs to enter Entrance Stairs-Rails: Right;Left;Can reach both Technical brewer of Steps: 4 Home Layout: One level Home Equipment: Cane - single point      Prior Function Level of Independence: Independent with assistive device(s)  Comments: Has been ambulating with  a cane     Hand Dominance        Extremity/Trunk Assessment   Upper Extremity Assessment Upper Extremity Assessment: Overall WFL for tasks assessed    Lower Extremity Assessment Lower Extremity Assessment: LLE deficits/detail LLE Deficits / Details: Minimal decrease in strength and AROM - pt reports significantly less pain after surgery    Cervical / Trunk Assessment Cervical / Trunk Assessment: Normal  Communication   Communication: No difficulties  Cognition Arousal/Alertness: Awake/alert Behavior During Therapy: WFL for tasks assessed/performed Overall Cognitive Status: Within Functional Limits for tasks assessed                                        General Comments      Exercises General Exercises - Lower Extremity Ankle Circles/Pumps: 10 reps Quad Sets: 10 reps Long Arc Quad: 10 reps Heel Slides: 10 reps   Assessment/Plan    PT Assessment Patent does not need any further PT services  PT Problem List         PT Treatment Interventions      PT Goals (Current goals can be found in the Care Plan section)  Acute Rehab PT Goals Patient Stated Goal: Home today PT Goal Formulation: All assessment and education complete, DC therapy    Frequency     Barriers to discharge        Co-evaluation               AM-PAC PT "6 Clicks" Daily Activity  Outcome Measure Difficulty turning over in bed (including adjusting bedclothes, sheets and blankets)?: A Little Difficulty moving from lying on back to sitting on the side of the bed? : A Little Difficulty sitting down on and standing up from a chair with arms (e.g., wheelchair, bedside commode, etc,.)?: None Help needed moving to and from a bed to chair (including a wheelchair)?: None Help needed walking in hospital room?: None Help needed climbing 3-5 steps with a railing? : A Little 6 Click Score: 21    End of Session Equipment Utilized During Treatment: Gait belt Activity Tolerance:  Patient tolerated treatment well Patient left: in chair;with call bell/phone within reach Nurse Communication: Mobility status PT Visit Diagnosis: Other abnormalities of gait and mobility (R26.89)    Time: 4259-5638 PT Time Calculation (min) (ACUTE ONLY): 28 min   Charges:   PT Evaluation $PT Eval Moderate Complexity: 1 Mod PT Treatments $Gait Training: 8-22 mins        Debbie [name], PT, DPT Acute Rehabilitation Services Pager: [phone] Office: [phone]   Thelma Comp 01/29/2018, 12:42 PM

## 2018-01-30 NOTE — Discharge Summary (Signed)
Patient ID: Debbie [name] MRN: [id number]  DOB/AGE: 54/18/1967 54 y.o.  Admit date: 01/28/2018 Discharge date: 01/29/2018  Primary Diagnosis: Left proximal femur pathologic fracture  Admission Diagnoses:  Past Medical History:  Diagnosis Date  . Anxiety   . Arthritis   . Bone metastasis ([place])   . Breast cancer of upper-inner quadrant of right female breast [facility]) oncologist-  dr [name]--- 04/ 2019 ER/PR+ Stage IV w/ metastatic disease to liver, total spine, lung, bone   dx 06/ 2017  right breast invasive ductal carcinoma, Stage IIB, ER/PR + (pT2 pN1), grade 1-- s/p  right breast lumpecotmy w/ sln bx's and bilateral breast reduction 11-28-2015/  left breast with reduction , dx invasive ductal ca, Grade 1 (pT1cpNX) ER/PR positive/  adjuvant radiation completed 03-27-2016 to right chest wall, 2018  reclassifiec Stage IIA  . Cancer, metastatic to liver ([place])   . Chronic back pain   . Constipation   . Depression   . Family history of adverse reaction to anesthesia     mother has Copd was kept on vent   . Headache   . Heart murmur   . History of hyperthyroidism    s/p  RAI  . History of radiation therapy 02/11/16- 03/27/16   Right Chest Wall 50.4 Gy in 28 fractions, Right Chest Wall Boost 10 Gy in 5 fractions.   . Hyperlipidemia   . Hypertension   . Hypothyroidism, postradioiodine therapy    endocrinoloigst-  dr balan--  2004  s/p  RAI  . Menorrhagia 2011  . Multiple pulmonary nodules    secondary  metastatic  . PONV (postoperative nausea and vomiting)    patch helps   . SUI (stress urinary incontinence, female)   . Wears contact lenses   . Wears dentures    full upper and lower partial   Discharge Diagnoses:   Principal Problem:   Pathological fracture in neoplastic disease, left femur, initial encounter for fracture [facility]) Active Problems:   Bone metastases ([place])  Estimated body mass index is 41.62 kg/m as calculated from the following:   Height as of this encounter:  5' 6.5" (1.689 m).   Weight as of this encounter: 118.8 kg.  Procedure:  Procedure(s) (LRB): INTRAMEDULLARY (IM) NAIL FEMORAL (Left)   Consults: None  HPI: Debbie [name] presented for prophylactic intramedullary nailing of the left femur given her metastatic breast carcinoma. Laboratory Data: Admission on 01/28/2018, Discharged on 01/29/2018  Component Date Value Ref Range Status  . WBC 01/28/2018 4.8  4.0 - 10.5 K/uL Final  . RBC 01/28/2018 4.01  3.87 - 5.11 MIL/uL Final  . Hemoglobin 01/28/2018 13.8  12.0 - 15.0 g/dL Final  . HCT 01/28/2018 41.3  36.0 - 46.0 % Final  . MCV 01/28/2018 103.0* 78.0 - 100.0 fL Final  . MCH 01/28/2018 34.4* 26.0 - 34.0 pg Final  . MCHC 01/28/2018 33.4  30.0 - 36.0 g/dL Final  . RDW 01/28/2018 12.6  11.5 - 15.5 % Final  . Platelets 01/28/2018 308  150 - 400 K/uL Final   Performed at [facility], [place] [address], [place], [place] [address]  . Sodium 01/28/2018 140  135 - 145 mmol/L Final  . Potassium 01/28/2018 3.0* 3.5 - 5.1 mmol/L Final  . Chloride 01/28/2018 101  98 - 111 mmol/L Final  . CO2 01/28/2018 28  22 - 32 mmol/L Final  . Glucose, Bld 01/28/2018 100* 70 - 99 mg/dL Final  . BUN 01/28/2018 11  6 - 20 mg/dL Final  .  Creatinine, Ser 01/28/2018 0.83  0.44 - 1.00 mg/dL Final  . Calcium 01/28/2018 9.0  8.9 - 10.3 mg/dL Final  . GFR calc non Af Amer 01/28/2018 >60  >60 mL/min Final  . GFR calc Af Amer 01/28/2018 >60  >60 mL/min Final   Comment: (NOTE) The eGFR has been calculated using the CKD EPI equation. This calculation has not been validated in all clinical situations. eGFR's persistently <60 mL/min signify possible Chronic Kidney Disease.   Georgiann Hahn gap 01/28/2018 11  5 - 15 Final   Performed at [facility], [place] [address], [place], [place] [address]  . Preg Test, Ur 01/28/2018 NEGATIVE  NEGATIVE Final   Comment:        THE SENSITIVITY OF THIS METHODOLOGY IS >24 mIU/mL   Appointment on 01/11/2018  Component Date Value  Ref Range Status  . Sodium 01/11/2018 140  135 - 145 mmol/L Final  . Potassium 01/11/2018 4.2  3.5 - 5.1 mmol/L Final  . Chloride 01/11/2018 106  98 - 111 mmol/L Final  . CO2 01/11/2018 25  22 - 32 mmol/L Final  . Glucose, Bld 01/11/2018 89  70 - 99 mg/dL Final  . BUN 01/11/2018 9  6 - 20 mg/dL Final  . Creatinine, Ser 01/11/2018 0.83  0.44 - 1.00 mg/dL Final  . Calcium 01/11/2018 9.4  8.9 - 10.3 mg/dL Final  . Total Protein 01/11/2018 7.5  6.5 - 8.1 g/dL Final  . Albumin 01/11/2018 3.8  3.5 - 5.0 g/dL Final  . AST 01/11/2018 34  15 - 41 U/L Final  . ALT 01/11/2018 43  0 - 44 U/L Final  . Alkaline Phosphatase 01/11/2018 117  38 - 126 U/L Final  . Total Bilirubin 01/11/2018 0.4  0.3 - 1.2 mg/dL Final  . GFR calc non Af Amer 01/11/2018 >60  >60 mL/min Final  . GFR calc Af Amer 01/11/2018 >60  >60 mL/min Final   Comment: (NOTE) The eGFR has been calculated using the CKD EPI equation. This calculation has not been validated in all clinical situations. eGFR's persistently <60 mL/min signify possible Chronic Kidney Disease.   Georgiann Hahn gap 01/11/2018 9  5 - 15 Final   Performed at [facility], [place] [address] [place], [place] [address]  . WBC 01/11/2018 3.3* 3.9 - 10.3 K/uL Final  . RBC 01/11/2018 3.72  3.70 - 5.45 MIL/uL Final  . Hemoglobin 01/11/2018 13.0  11.6 - 15.9 g/dL Final  . HCT 01/11/2018 38.0  34.8 - 46.6 % Final  . MCV 01/11/2018 102.2* 79.5 - 101.0 fL Final  . MCH 01/11/2018 34.9* 25.1 - 34.0 pg Final  . MCHC 01/11/2018 34.2  31.5 - 36.0 g/dL Final  . RDW 01/11/2018 14.4  11.2 - 14.5 % Final  . Platelets 01/11/2018 207  145 - 400 K/uL Final  . Neutrophils Relative % 01/11/2018 56  % Final  . Neutro Abs 01/11/2018 1.9  1.5 - 6.5 K/uL Final  . Lymphocytes Relative 01/11/2018 28  % Final  . Lymphs Abs 01/11/2018 0.9  0.9 - 3.3 K/uL Final  . Monocytes Relative 01/11/2018 11  % Final  . Monocytes Absolute 01/11/2018 0.4  0.1 - 0.9 K/uL Final  .  Eosinophils Relative 01/11/2018 3  % Final  . Eosinophils Absolute 01/11/2018 0.1  0.0 - 0.5 K/uL Final  . Basophils Relative 01/11/2018 2  % Final  . Basophils Absolute 01/11/2018 0.1  0.0 - 0.1 K/uL Final   Performed at [facility], [place] [address] [place], [place] [address]  Appointment on 01/04/2018  Component Date Value Ref Range Status  . WBC 01/04/2018 3.4* 3.9 - 10.3 K/uL Final  . RBC 01/04/2018 3.84  3.70 - 5.45 MIL/uL Final  . Hemoglobin 01/04/2018 13.6  11.6 - 15.9 g/dL Final  . HCT 01/04/2018 39.3  34.8 - 46.6 % Final  . MCV 01/04/2018 102.3* 79.5 - 101.0 fL Final  . MCH 01/04/2018 35.4* 25.1 - 34.0 pg Final  . MCHC 01/04/2018 34.6  31.5 - 36.0 g/dL Final  . RDW 01/04/2018 14.7* 11.2 - 14.5 % Final  . Platelets 01/04/2018 218  145 - 400 K/uL Final   large plt seen  . Neutrophils Relative % 01/04/2018 56  % Final  . Lymphocytes Relative 01/04/2018 34  % Final  . Monocytes Relative 01/04/2018 5  % Final  . Eosinophils Relative 01/04/2018 4  % Final  . Basophils Relative 01/04/2018 1  % Final  . Neutro Abs 01/04/2018 1.9  1.5 - 6.5 K/uL Final  . Lymphs Abs 01/04/2018 1.2  0.9 - 3.3 K/uL Final  . Monocytes Absolute 01/04/2018 0.2  0.1 - 0.9 K/uL Final  . Eosinophils Absolute 01/04/2018 0.1  0.0 - 0.5 K/uL Final  . Basophils Absolute 01/04/2018 0.0  0.0 - 0.1 K/uL Final  . Smear Review 01/04/2018 ATYP AND SMDG   Final   Performed at [facility], [place] [address] [place], [place] [address]  . Sodium 01/04/2018 140  135 - 145 mmol/L Final  . Potassium 01/04/2018 4.2  3.5 - 5.1 mmol/L Final  . Chloride 01/04/2018 106  98 - 111 mmol/L Final  . CO2 01/04/2018 24  22 - 32 mmol/L Final  . Glucose, Bld 01/04/2018 97  70 - 99 mg/dL Final  . BUN 01/04/2018 11  6 - 20 mg/dL Final  . Creatinine, Ser 01/04/2018 1.00  0.44 - 1.00 mg/dL Final  . Calcium 01/04/2018 9.9  8.9 - 10.3 mg/dL Final  . Total Protein 01/04/2018 7.8  6.5 - 8.1  g/dL Final  . Albumin 01/04/2018 3.8  3.5 - 5.0 g/dL Final  . AST 01/04/2018 33  15 - 41 U/L Final  . ALT 01/04/2018 42  0 - 44 U/L Final  . Alkaline Phosphatase 01/04/2018 121  38 - 126 U/L Final  . Total Bilirubin 01/04/2018 0.3  0.3 - 1.2 mg/dL Final  . GFR calc non Af Amer 01/04/2018 >60  >60 mL/min Final  . GFR calc Af Amer 01/04/2018 >60  >60 mL/min Final   Comment: (NOTE) The eGFR has been calculated using the CKD EPI equation. This calculation has not been validated in all clinical situations. eGFR's persistently <60 mL/min signify possible Chronic Kidney Disease.   Georgiann Hahn gap 01/04/2018 10  5 - 15 Final   Performed at [facility], [place] [address], [place], [place] [address]  . CA 27.29 01/04/2018 391.7* 0.0 - 38.6 U/mL Final   Comment: (NOTE) Siemens Centaur Immunochemiluminometric Methodology [facility]) Values obtained with different assay methods or kits cannot be used interchangeably. Results cannot be interpreted as absolute evidence of the presence or absence of malignant disease. Performed At: [facility] [place], [place] [id number] Rush Farmer MD VO:[id number]   Appointment on 12/07/2017  Component Date Value Ref Range Status  . CA 27.29 12/07/2017 386.4* 0.0 - 38.6 U/mL Final   Comment: (NOTE) Siemens Centaur Immunochemiluminometric Methodology [facility]) Values obtained with different assay  methods or kits cannot be used interchangeably. Results cannot be interpreted as absolute evidence of the presence or absence of malignant disease. Performed At: [facility] [place], [place] [id number] Rush Farmer MD FU:[id number]   . Sodium 12/07/2017 142  135 - 145 mmol/L Final  . Potassium 12/07/2017 4.1  3.5 - 5.1 mmol/L Final  . Chloride 12/07/2017 106  98 - 111 mmol/L Final  . CO2 12/07/2017 27  22 - 32 mmol/L Final  . Glucose, Bld 12/07/2017 89  70 - 99 mg/dL Final  . BUN 12/07/2017 11  6 -  20 mg/dL Final  . Creatinine, Ser 12/07/2017 1.12* 0.44 - 1.00 mg/dL Final  . Calcium 12/07/2017 9.9  8.9 - 10.3 mg/dL Final  . Total Protein 12/07/2017 7.9  6.5 - 8.1 g/dL Final  . Albumin 12/07/2017 3.9  3.5 - 5.0 g/dL Final  . AST 12/07/2017 41  15 - 41 U/L Final  . ALT 12/07/2017 48* 0 - 44 U/L Final  . Alkaline Phosphatase 12/07/2017 124  38 - 126 U/L Final  . Total Bilirubin 12/07/2017 0.3  0.3 - 1.2 mg/dL Final  . GFR calc non Af Amer 12/07/2017 55* >60 mL/min Final  . GFR calc Af Amer 12/07/2017 >60  >60 mL/min Final   Comment: (NOTE) The eGFR has been calculated using the CKD EPI equation. This calculation has not been validated in all clinical situations. eGFR's persistently <60 mL/min signify possible Chronic Kidney Disease.   Georgiann Hahn gap 12/07/2017 9  5 - 15 Final   Performed at [facility], [place] [address], [place], [place] [address]  . WBC 12/07/2017 2.6* 3.9 - 10.3 K/uL Final  . RBC 12/07/2017 3.93  3.70 - 5.45 MIL/uL Final  . Hemoglobin 12/07/2017 13.5  11.6 - 15.9 g/dL Final  . HCT 12/07/2017 39.3  34.8 - 46.6 % Final  . MCV 12/07/2017 100.0  79.5 - 101.0 fL Final  . MCH 12/07/2017 34.4* 25.1 - 34.0 pg Final  . MCHC 12/07/2017 34.4  31.5 - 36.0 g/dL Final  . RDW 12/07/2017 16.0* 11.2 - 14.5 % Final  . Platelets 12/07/2017 292  145 - 400 K/uL Final  . Neutrophils Relative % 12/07/2017 50  % Final  . Neutro Abs 12/07/2017 1.3* 1.5 - 6.5 K/uL Final  . Lymphocytes Relative 12/07/2017 37  % Final  . Lymphs Abs 12/07/2017 1.0  0.9 - 3.3 K/uL Final  . Monocytes Relative 12/07/2017 5  % Final  . Monocytes Absolute 12/07/2017 0.1  0.1 - 0.9 K/uL Final  . Eosinophils Relative 12/07/2017 5  % Final  . Eosinophils Absolute 12/07/2017 0.1  0.0 - 0.5 K/uL Final  . Basophils Relative 12/07/2017 3  % Final  . Basophils Absolute 12/07/2017 0.1  0.0 - 0.1 K/uL Final   Performed at [facility], [place] [address] [place], [place]  [address]     X-Rays:Ct Femur Left Wo Contrast  Result Date: 01/19/2018 CLINICAL DATA:  Metastatic breast cancer. Femur pain. Abnormal bone scan. EXAM: CT OF THE LOWER LEFT EXTREMITY WITHOUT CONTRAST TECHNIQUE: Multidetector CT imaging of the lower left extremity was performed according to the standard protocol. COMPARISON:  Bone scan dated 12/24/2017 FINDINGS: Bones/Joint/Cartilage There are multiple lytic lesions at the left hip including in the left ilium, posterior aspect of the left acetabulum, left ischial tuberosity, and in the left ischium at the base of the right superior pubic ramus best seen on image 4 of series  603. Largest lesion is in the intertrochanteric region of the proximal left femur measuring approximately 5.5 x 2.8 x 3.1 cm. There is a slight permeative pattern of the inferior aspect of the femoral cortex in the intertrochanteric region. No significant abnormality of the distal femur. Soft tissues The muscles and other soft tissues appear normal.  No adenopathy. IMPRESSION: Multiple lytic metastases in the left ilium and ischium as well as a more prominent lytic metastasis in the intertrochanteric region of the proximal left femur. Electronically Signed   By: Lorriane Shire M.D.   On: 01/19/2018 13:04   Dg C-arm 1-60 Min  Result Date: 01/28/2018 CLINICAL DATA:  ORIF. EXAM: DG C-ARM 61-120 MIN COMPARISON:  CT 01/19/2018. FINDINGS: ORIF left femur. Hardware intact. Anatomic alignment. 4 images obtained. 1 minutes 51 seconds fluoroscopy time utilized. IMPRESSION: ORIF left femur. Electronically Signed   By: Marcello Moores  Register   On: 01/28/2018 09:24   Dg Femur Min 2 Views Left  Result Date: 01/28/2018 CLINICAL DATA:  IM nail EXAM: LEFT FEMUR 2 VIEWS COMPARISON:  CT 01/19/2018 FINDINGS: Placement of hip screw and intramedullary nail within the left femur. No hardware bony complicating feature. IMPRESSION: As above. Electronically Signed   By: Rolm Baptise M.D.   On: 01/28/2018 09:21     EKG: Orders placed or performed during the hospital encounter of 01/28/18  . EKG 12-Lead  . EKG 12-Lead     Hospital Course: Debbie [name] is a 52 y.o. who was admitted to Hospital. They were brought to the operating room on 01/28/2018 and underwent Procedure(s): INTRAMEDULLARY (IM) NAIL FEMORAL.  Patient tolerated the procedure well and was later transferred to the recovery room and then to the orthopaedic floor for postoperative care.  They were given PO and IV analgesics for pain control following their surgery.  They were given 24 hours of postoperative antibiotics of  Anti-infectives (From admission, onward)   Start     Dose/Rate Route Frequency Ordered Stop   01/28/18 1400  ceFAZolin (ANCEF) IVPB 2g/100 mL premix     2 g 200 mL/hr over 30 Minutes Intravenous Every 6 hours 01/28/18 1031 01/29/18 0335   01/28/18 0645  ceFAZolin (ANCEF) 3 g in dextrose 5 % 50 mL IVPB     3 g 100 mL/hr over 30 Minutes Intravenous To ShortStay Surgical 01/27/18 1403 01/28/18 0759   01/28/18 0551  ceFAZolin (ANCEF) 2-4 GM/100ML-% IVPB    Note to Pharmacy:  [place]   : cabinet override      01/28/18 0551 01/28/18 1759     and started on DVT prophylaxis in the form of Lovenox.   PT and OT were ordered. Patient was seen in rounds and was ready to go home.   Diet: Regular diet Activity:WBAT Follow-up:in 2 weeks Disposition - Home Discharged Condition: good,  She will be able to begin radiation therapy to the proximal femur metastatic lesion 2 weeks postop.   Discharge Instructions    Ambulatory referral to Physical Therapy   Complete by:  As directed    Call MD / Call 911   Complete by:  As directed    If you experience chest pain or shortness of breath, CALL 911 and be transported to the hospital emergency room.  If you develope a fever above 101 F, pus (white drainage) or increased drainage or redness at the wound, or calf pain, call your surgeon's office.   Call MD / Call 911    Complete by:  As directed  If you experience chest pain or shortness of breath, CALL 911 and be transported to the hospital emergency room.  If you develope a fever above 101 F, pus (white drainage) or increased drainage or redness at the wound, or calf pain, call your surgeon's office.   Constipation Prevention   Complete by:  As directed    Drink plenty of fluids.  Prune juice may be helpful.  You may use a stool softener, such as Colace (over the counter) 100 mg twice a day.  Use MiraLax (over the counter) for constipation as needed.   Constipation Prevention   Complete by:  As directed    Drink plenty of fluids.  Prune juice may be helpful.  You may use a stool softener, such as Colace (over the counter) 100 mg twice a day.  Use MiraLax (over the counter) for constipation as needed.   Diet - low sodium heart healthy   Complete by:  As directed    Diet - low sodium heart healthy   Complete by:  As directed    Increase activity slowly as tolerated   Complete by:  As directed    Increase activity slowly as tolerated   Complete by:  As directed      Allergies as of 01/29/2018      Reactions   Ace Inhibitors Cough   Atorvastatin Other (See Comments)   Joint pain   Simvastatin Other (See Comments)   Joint pain      Medication List    STOP taking these medications   traMADol 50 MG tablet Commonly known as:  ULTRAM     TAKE these medications   acetaminophen 325 MG tablet Commonly known as:  TYLENOL Take 325 mg by mouth every 6 (six) hours as needed for mild pain or headache.   anastrozole 1 MG tablet Commonly known as:  ARIMIDEX Take 1 tablet (1 mg total) by mouth daily.   Calcium-Magnesium 250-125 MG Tabs Take 1 tablet by mouth daily.   dexamethasone 0.5 MG/5ML solution Commonly known as:  DECADRON Swish 80m in mouth for 242m and spit out. Use 4 times daily for 8 weeks, start with Afinitor. Avoid eating/drinking for 1hr after rinse. What changed:    how much to  take  how to take this  when to take this   docusate sodium 100 MG capsule Commonly known as:  COLACE Take 200-400 mg by mouth daily.   everolimus 10 MG tablet Commonly known as:  AFINITOR Take 1 tablet (10 mg total) by mouth daily. Take at approximately the same time each day with a glass of water.   hydrochlorothiazide 12.5 MG capsule Commonly known as:  MICROZIDE Take 12.5 mg by mouth every morning.   hydrocortisone cream 1 % Apply 1 application topically daily as needed (for rash).   ibuprofen 600 MG tablet Commonly known as:  ADVIL,MOTRIN Take 1 tablet (600 mg total) by mouth every 6 (six) hours as needed for moderate pain.   levothyroxine 137 MCG tablet Commonly known as:  SYNTHROID, LEVOTHROID Take 137 mcg by mouth daily before breakfast.   LIVALO 2 MG Tabs Generic drug:  Pitavastatin Calcium Take 2 mg by mouth daily.   losartan 100 MG tablet Commonly known as:  COZAAR Take 100 mg by mouth every morning.   multivitamin tablet Take 1 tablet by mouth daily.   omega-3 acid ethyl esters 1 g capsule Commonly known as:  LOVAZA Take 1 g by mouth 2 (two) times daily.  oxyCODONE 5 MG immediate release tablet Commonly known as:  Oxy IR/ROXICODONE Take 1 tablet (5 mg total) by mouth every 6 (six) hours as needed for severe pain.   potassium chloride SA 20 MEQ tablet Commonly known as:  K-DUR,KLOR-CON Take 40 mEq by mouth daily.   prochlorperazine 10 MG tablet Commonly known as:  COMPAZINE Take 1 tablet (10 mg total) by mouth every 6 (six) hours as needed for nausea or vomiting.   Vitamin D (Cholecalciferol) 1000 units Caps Take 1 tablet by mouth daily. What changed:  how much to take   ZYRTEC ALLERGY 10 MG tablet Generic drug:  cetirizine Take 10 mg by mouth daily as needed for allergies.      Follow-up Information    Nicholes Stairs, MD.   Specialty:  Orthopedic Surgery Contact information: [address] [place]  [address] [phone]           Signed: Geralynn Rile, MD Orthopaedic Surgery 01/30/2018, 4:50 PM

## 2018-01-31 ENCOUNTER — Encounter (HOSPITAL_COMMUNITY): Payer: Self-pay | Admitting: Orthopedic Surgery

## 2018-01-31 ENCOUNTER — Other Ambulatory Visit: Payer: Self-pay | Admitting: *Deleted

## 2018-01-31 NOTE — Patient Outreach (Addendum)
Debbie [name]) Care Management  01/31/2018  Debbie [name] 05-Apr-1966 [id number]   Subjective: Telephone call to patient's home / mobile number, spoke with patient, and HIPAA verified.  Discussed [facility] Care Management UMR Transition of care follow up, patient voiced understanding, and is in agreement to follow up.   Patient states she is doing well, staying positive, her stage 4 cancer is just a diagnosis, diagnosis is not going to determine how she lives her life, surrounds herself with only positive people, and is going to continue to live each day to the fullest.   States she is feeling great and has great mobility after the surgery.  States her surgery was preventive to keep the femur from breaking due to bone mets.  States she plans to return to work on 02/13/18, has the following follow up appointments: 02/01/18 infusion, 02/04/18 outpatient physical therapy at [name], 02/10/18 outpatient physical therapy at [name], and 02/21/18 surgeon follow up.   Patient states she is able to manage self care and has assistance as needed.   Patient voices understanding of medical diagnosis, surgery, and treatment plan. States she is accessing the following [facility] benefits: outpatient pharmacy, hospital indemnity (verbally given contact number for Teena Dunk [phone], will call [employer] to verify that she has completed the claim on file appropriately, verbally given contact number for [employer] [phone] to request itemized bill), and has family medical leave act (FMLA) in place.   Patient states she does not have any transition of care, care coordination, disease management, disease monitoring, transportation, community resource, or pharmacy needs at this time.  Patient in agreement to receive the following educational materials via mail:Bone Metastasis, Low Bacteria Diet.  States she is very appreciative of the follow up and is in agreement to receive [place] Management information.      Objective:.Per KPN (Knowledge Performance Now, point of care tool) and chart review, patient hospitalized  01/28/18 -01/29/18 for left hip impending pathologic fracture , subtrochanteric,  status post  Treatment of intertrochanteric, pertrochanteric, subtrochanteric fracture with intramedullary implant on 01/28/18.   Patient has a history of bone metastasis, Breast cancer of upper-inner quadrant of right female breast, Cancer, metastatic to liver, constipation, headache, heart murmur, hyperthyroidism, radiation therapy, Menorrhagia, and SUI (stress urinary incontinence, female).     Assessment:  Received UMR Transition of care referral on  01/31/18.    Transition of care follow up completed, no care management needs, and will proceed with case closure.        Plan: RNCM will send patient successful outreach letter, [name] pamphlet, and magnet. RNCM will complete case closure due to follow up completed / no care management needs.  RNCM will send patient the following educational material per her request:Bone Metastasis, Low Bacteria Diet.        Debbie [name], BSN, [place] Management [name] Telephonic CM Phone: [phone] Fax: [phone]

## 2018-01-31 NOTE — Addendum Note (Signed)
Encounter addended by: Jacqulynn Shappell, [name], RN on: 01/31/2018 2:59 PM  Actions taken: Charge Capture section accepted

## 2018-02-01 ENCOUNTER — Inpatient Hospital Stay: Payer: 59

## 2018-02-01 DIAGNOSIS — Z9013 Acquired absence of bilateral breasts and nipples: Secondary | ICD-10-CM | POA: Diagnosis not present

## 2018-02-01 DIAGNOSIS — K449 Diaphragmatic hernia without obstruction or gangrene: Secondary | ICD-10-CM | POA: Diagnosis not present

## 2018-02-01 DIAGNOSIS — I10 Essential (primary) hypertension: Secondary | ICD-10-CM | POA: Diagnosis not present

## 2018-02-01 DIAGNOSIS — Z79899 Other long term (current) drug therapy: Secondary | ICD-10-CM | POA: Diagnosis not present

## 2018-02-01 DIAGNOSIS — Z170 Estrogen receptor positive status [ER+]: Secondary | ICD-10-CM

## 2018-02-01 DIAGNOSIS — E785 Hyperlipidemia, unspecified: Secondary | ICD-10-CM | POA: Diagnosis not present

## 2018-02-01 DIAGNOSIS — F419 Anxiety disorder, unspecified: Secondary | ICD-10-CM | POA: Diagnosis not present

## 2018-02-01 DIAGNOSIS — C7951 Secondary malignant neoplasm of bone: Secondary | ICD-10-CM | POA: Diagnosis not present

## 2018-02-01 DIAGNOSIS — C787 Secondary malignant neoplasm of liver and intrahepatic bile duct: Secondary | ICD-10-CM | POA: Diagnosis not present

## 2018-02-01 DIAGNOSIS — C50112 Malignant neoplasm of central portion of left female breast: Secondary | ICD-10-CM

## 2018-02-01 DIAGNOSIS — C50211 Malignant neoplasm of upper-inner quadrant of right female breast: Secondary | ICD-10-CM

## 2018-02-01 DIAGNOSIS — Z23 Encounter for immunization: Secondary | ICD-10-CM

## 2018-02-01 DIAGNOSIS — C50012 Malignant neoplasm of nipple and areola, left female breast: Secondary | ICD-10-CM

## 2018-02-01 DIAGNOSIS — C50011 Malignant neoplasm of nipple and areola, right female breast: Secondary | ICD-10-CM

## 2018-02-01 DIAGNOSIS — F329 Major depressive disorder, single episode, unspecified: Secondary | ICD-10-CM | POA: Diagnosis not present

## 2018-02-01 DIAGNOSIS — Z79810 Long term (current) use of selective estrogen receptor modulators (SERMs): Secondary | ICD-10-CM | POA: Diagnosis not present

## 2018-02-01 DIAGNOSIS — C7800 Secondary malignant neoplasm of unspecified lung: Secondary | ICD-10-CM | POA: Diagnosis not present

## 2018-02-01 LAB — CBC WITH DIFFERENTIAL/PLATELET
Basophils Absolute: 0 10*3/uL (ref 0.0–0.1)
Basophils Relative: 1 %
Eosinophils Absolute: 0.4 10*3/uL (ref 0.0–0.5)
Eosinophils Relative: 7 %
HCT: 37 % (ref 34.8–46.6)
Hemoglobin: 12.5 g/dL (ref 11.6–15.9)
Lymphocytes Relative: 19 %
Lymphs Abs: 1.1 10*3/uL (ref 0.9–3.3)
MCH: 34.2 pg — ABNORMAL HIGH (ref 25.1–34.0)
MCHC: 33.8 g/dL (ref 31.5–36.0)
MCV: 101.4 fL — ABNORMAL HIGH (ref 79.5–101.0)
Monocytes Absolute: 0.6 10*3/uL (ref 0.1–0.9)
Monocytes Relative: 10 %
Neutro Abs: 3.7 10*3/uL (ref 1.5–6.5)
Neutrophils Relative %: 63 %
Platelets: 266 10*3/uL (ref 145–400)
RBC: 3.65 MIL/uL — ABNORMAL LOW (ref 3.70–5.45)
RDW: 13.3 % (ref 11.2–14.5)
WBC: 5.9 10*3/uL (ref 3.9–10.3)

## 2018-02-01 LAB — COMPREHENSIVE METABOLIC PANEL
ALT: 54 U/L — ABNORMAL HIGH (ref 0–44)
AST: 59 U/L — ABNORMAL HIGH (ref 15–41)
Albumin: 3.3 g/dL — ABNORMAL LOW (ref 3.5–5.0)
Alkaline Phosphatase: 129 U/L — ABNORMAL HIGH (ref 38–126)
Anion gap: 12 (ref 5–15)
BUN: 9 mg/dL (ref 6–20)
CO2: 25 mmol/L (ref 22–32)
Calcium: 9.4 mg/dL (ref 8.9–10.3)
Chloride: 105 mmol/L (ref 98–111)
Creatinine, Ser: 0.79 mg/dL (ref 0.44–1.00)
GFR calc Af Amer: >60 mL/min (ref >60)
GFR calc non Af Amer: >60 mL/min (ref >60)
Glucose, Bld: 111 mg/dL — ABNORMAL HIGH (ref 70–99)
Potassium: 3.8 mmol/L (ref 3.5–5.1)
Sodium: 142 mmol/L (ref 135–145)
Total Bilirubin: 0.3 mg/dL (ref 0.3–1.2)
Total Protein: 7.4 g/dL (ref 6.5–8.1)

## 2018-02-01 MED ORDER — DENOSUMAB 120 MG/1.7ML ~~LOC~~ SOLN
120.0000 mg | Freq: Once | SUBCUTANEOUS | Status: AC
  Administered 2018-02-01: 120 mg via SUBCUTANEOUS

## 2018-02-01 MED ORDER — INFLUENZA VAC SPLIT QUAD 0.5 ML IM SUSY
PREFILLED_SYRINGE | INTRAMUSCULAR | Status: AC
  Filled 2018-02-01: qty 0.5

## 2018-02-01 MED ORDER — DENOSUMAB 120 MG/1.7ML ~~LOC~~ SOLN
SUBCUTANEOUS | Status: AC
  Filled 2018-02-01: qty 1.7

## 2018-02-01 MED ORDER — INFLUENZA VAC SPLIT QUAD 0.5 ML IM SUSY
0.5000 mL | PREFILLED_SYRINGE | Freq: Once | INTRAMUSCULAR | Status: AC
  Administered 2018-02-01: 0.5 mL via INTRAMUSCULAR

## 2018-02-02 LAB — CANCER ANTIGEN 27.29: CA 27.29: 232.3 U/mL — ABNORMAL HIGH (ref 0.0–38.6)

## 2018-02-02 MED FILL — DEXAMETHASONE 0.5 MG/5 ML L: 0.5 | 12 days supply | Qty: 500 | Fill #1

## 2018-02-04 DIAGNOSIS — M6281 Muscle weakness (generalized): Secondary | ICD-10-CM | POA: Diagnosis not present

## 2018-02-04 DIAGNOSIS — M25552 Pain in left hip: Secondary | ICD-10-CM | POA: Diagnosis not present

## 2018-02-04 DIAGNOSIS — H524 Presbyopia: Secondary | ICD-10-CM | POA: Diagnosis not present

## 2018-02-04 MED FILL — AFINITOR 10 MG TABLET: 10 | 28 days supply | Qty: 28 | Fill #1

## 2018-02-08 ENCOUNTER — Telehealth: Payer: Self-pay | Admitting: Adult Health

## 2018-02-08 ENCOUNTER — Inpatient Hospital Stay: Payer: 59 | Attending: Oncology

## 2018-02-08 ENCOUNTER — Inpatient Hospital Stay (HOSPITAL_BASED_OUTPATIENT_CLINIC_OR_DEPARTMENT_OTHER): Payer: 59 | Admitting: Adult Health

## 2018-02-08 ENCOUNTER — Encounter: Payer: Self-pay | Admitting: Adult Health

## 2018-02-08 VITALS — BP 128/83 | HR 78 | Temp 98.6°F | Resp 18 | Ht 66.5 in | Wt 261.4 lb

## 2018-02-08 DIAGNOSIS — C7951 Secondary malignant neoplasm of bone: Secondary | ICD-10-CM

## 2018-02-08 DIAGNOSIS — Z170 Estrogen receptor positive status [ER+]: Secondary | ICD-10-CM

## 2018-02-08 DIAGNOSIS — Z9013 Acquired absence of bilateral breasts and nipples: Secondary | ICD-10-CM | POA: Insufficient documentation

## 2018-02-08 DIAGNOSIS — C50012 Malignant neoplasm of nipple and areola, left female breast: Secondary | ICD-10-CM

## 2018-02-08 DIAGNOSIS — R918 Other nonspecific abnormal finding of lung field: Secondary | ICD-10-CM | POA: Diagnosis not present

## 2018-02-08 DIAGNOSIS — C787 Secondary malignant neoplasm of liver and intrahepatic bile duct: Secondary | ICD-10-CM | POA: Insufficient documentation

## 2018-02-08 DIAGNOSIS — Z79899 Other long term (current) drug therapy: Secondary | ICD-10-CM | POA: Insufficient documentation

## 2018-02-08 DIAGNOSIS — Z79811 Long term (current) use of aromatase inhibitors: Secondary | ICD-10-CM | POA: Insufficient documentation

## 2018-02-08 DIAGNOSIS — I10 Essential (primary) hypertension: Secondary | ICD-10-CM | POA: Insufficient documentation

## 2018-02-08 DIAGNOSIS — F329 Major depressive disorder, single episode, unspecified: Secondary | ICD-10-CM | POA: Insufficient documentation

## 2018-02-08 DIAGNOSIS — E785 Hyperlipidemia, unspecified: Secondary | ICD-10-CM | POA: Diagnosis not present

## 2018-02-08 DIAGNOSIS — C50011 Malignant neoplasm of nipple and areola, right female breast: Secondary | ICD-10-CM

## 2018-02-08 DIAGNOSIS — C50112 Malignant neoplasm of central portion of left female breast: Secondary | ICD-10-CM

## 2018-02-08 DIAGNOSIS — Z975 Presence of (intrauterine) contraceptive device: Secondary | ICD-10-CM | POA: Insufficient documentation

## 2018-02-08 DIAGNOSIS — M1990 Unspecified osteoarthritis, unspecified site: Secondary | ICD-10-CM | POA: Diagnosis not present

## 2018-02-08 DIAGNOSIS — C50211 Malignant neoplasm of upper-inner quadrant of right female breast: Secondary | ICD-10-CM | POA: Insufficient documentation

## 2018-02-08 DIAGNOSIS — F419 Anxiety disorder, unspecified: Secondary | ICD-10-CM | POA: Insufficient documentation

## 2018-02-08 LAB — COMPREHENSIVE METABOLIC PANEL
ALT: 40 U/L (ref 0–44)
AST: 36 U/L (ref 15–41)
Albumin: 3.4 g/dL — ABNORMAL LOW (ref 3.5–5.0)
Alkaline Phosphatase: 149 U/L — ABNORMAL HIGH (ref 38–126)
Anion gap: 10 (ref 5–15)
BUN: 11 mg/dL (ref 6–20)
CO2: 27 mmol/L (ref 22–32)
Calcium: 9.8 mg/dL (ref 8.9–10.3)
Chloride: 105 mmol/L (ref 98–111)
Creatinine, Ser: 1.05 mg/dL — ABNORMAL HIGH (ref 0.44–1.00)
GFR calc Af Amer: >60 mL/min (ref >60)
GFR calc non Af Amer: 59 mL/min — ABNORMAL LOW (ref >60)
Glucose, Bld: 111 mg/dL — ABNORMAL HIGH (ref 70–99)
Potassium: 3.4 mmol/L — ABNORMAL LOW (ref 3.5–5.1)
Sodium: 142 mmol/L (ref 135–145)
Total Bilirubin: 0.4 mg/dL (ref 0.3–1.2)
Total Protein: 7.8 g/dL (ref 6.5–8.1)

## 2018-02-08 LAB — CBC WITH DIFFERENTIAL/PLATELET
Basophils Absolute: 0 10*3/uL (ref 0.0–0.1)
Basophils Relative: 1 %
Eosinophils Absolute: 0.3 10*3/uL (ref 0.0–0.5)
Eosinophils Relative: 7 %
HCT: 40.1 % (ref 34.8–46.6)
Hemoglobin: 13.6 g/dL (ref 11.6–15.9)
Lymphocytes Relative: 20 %
Lymphs Abs: 1 10*3/uL (ref 0.9–3.3)
MCH: 34.2 pg — ABNORMAL HIGH (ref 25.1–34.0)
MCHC: 33.9 g/dL (ref 31.5–36.0)
MCV: 100.8 fL (ref 79.5–101.0)
Monocytes Absolute: 0.3 10*3/uL (ref 0.1–0.9)
Monocytes Relative: 5 %
Neutro Abs: 3.4 10*3/uL (ref 1.5–6.5)
Neutrophils Relative %: 67 %
Platelets: 350 10*3/uL (ref 145–400)
RBC: 3.98 MIL/uL (ref 3.70–5.45)
RDW: 13.6 % (ref 11.2–14.5)
WBC: 4.9 10*3/uL (ref 3.9–10.3)

## 2018-02-08 NOTE — Telephone Encounter (Signed)
Per 10/1 no los

## 2018-02-08 NOTE — Progress Notes (Signed)
[place]  [phone] [phone]     ID: Debbie [name] DOB: Aug 26, 1965  MR#: [id number]  XTG#:[id number]  Patient Care Team: Kelton Pillar, MD as PCP - General (Family Medicine) Jacelyn Pi, MD as Consulting Physician (Endocrinology) Leo Grosser Seymour Bars, MD as Consulting Physician (Obstetrics and Gynecology) Jovita Kussmaul, MD as Consulting Physician (General Surgery) [name], Virgie Dad, MD as Consulting Physician (Oncology) [name], MD as Attending Physician (Radiation Oncology) Belva Crome, MD as Consulting Physician (Cardiology) Crissie Reese, MD as Consulting Physician (Plastic Surgery) [name], [name], NP as Nurse Practitioner (Hematology and Oncology) PCP: Kelton Pillar, MD OTHER MD:  CHIEF COMPLAINT: Estrogen receptor positive stage IV  breast cancer  CURRENT TREATMENT:  Anastrozole, affinitor; denosumab/Xgeva  INTERVAL HISTORY: Debbie [name] returns today for follow-up of her estrogen receptor positive stage IV breast cancer. She was started on anastrozole    At her last appointment she had not yet started the Affinitor.  However she did get it about a month ago and she has been toelrating it well.  She has a mouth was she is using regularly and she is eating and drinking well.    She receives denosumab/Xgeva every 4 weeks with her most recent dose on 01/04/2018.  Debbie [name] was referred to ortho surgery by Dr. [name] and underwent left femur IM nail place due to impending pathologic fracture.  She was talking with Dr. [name] about radiation.  Since having the surgery, the radiation is going to be put on hold for a few weeks.     REVIEW OF SYSTEMS: Debbie [name] denies any new pain other than that in her left hip.  She notes it is improved since her surgery.    PAST MEDICAL HISTORY: Past Medical History:  Diagnosis Date  . Anxiety   . Arthritis   . Bone metastasis ([place])   . Breast cancer of upper-inner quadrant of  right female breast [facility]) oncologist-  dr [name]--- 04/ 2019 ER/PR+ Stage IV w/ metastatic disease to liver, total spine, lung, bone   dx 06/ 2017  right breast invasive ductal carcinoma, Stage IIB, ER/PR + (pT2 pN1), grade 1-- s/p  right breast lumpecotmy w/ sln bx's and bilateral breast reduction 11-28-2015/  left breast with reduction , dx invasive ductal ca, Grade 1 (pT1cpNX) ER/PR positive/  adjuvant radiation completed 03-27-2016 to right chest wall, 2018  reclassifiec Stage IIA  . Cancer, metastatic to liver ([place])   . Chronic back pain   . Constipation   . Depression   . Family history of adverse reaction to anesthesia     mother has Copd was kept on vent   . Headache   . Heart murmur   . History of hyperthyroidism    s/p  RAI  . History of radiation therapy 02/11/16- 03/27/16   Right Chest Wall 50.4 Gy in 28 fractions, Right Chest Wall Boost 10 Gy in 5 fractions.   . Hyperlipidemia   . Hypertension   . Hypothyroidism, postradioiodine therapy    endocrinoloigst-  dr balan--  2004  s/p  RAI  . Menorrhagia 2011  . Multiple pulmonary nodules    secondary  metastatic  . PONV (postoperative nausea and vomiting)    patch helps   . SUI (stress urinary incontinence, female)   . Wears contact lenses   . Wears dentures    full upper and lower partial    PAST SURGICAL HISTORY: Past Surgical History:  Procedure Laterality Date  . BREAST LUMPECTOMY WITH NEEDLE  LOCALIZATION AND AXILLARY SENTINEL LYMPH NODE BX Right 11/28/2015   Procedure: RIGHT BREAST LUMPECTOMY WITH NEEDLE LOCALIZATION AND AXILLARY SENTINEL LYMPH NODE BX;  Surgeon: Autumn Messing III, MD;  Location: [facility];  Service: General;  Laterality: Right;  . BREAST REDUCTION SURGERY Bilateral 11/28/2015   Procedure: MAMMARY REDUCTION  (BREAST) BILATERAL;  Surgeon: Crissie Reese, MD;  Location: [facility];  Service: Plastics;  Laterality: Bilateral;  . CESAREAN SECTION  01/12/1993  . COLONOSCOPY    . Dental surgeries    . FEMUR IM NAIL Left  01/28/2018   Procedure: INTRAMEDULLARY (IM) NAIL FEMORAL;  Surgeon: Nicholes Stairs, MD;  Location: [facility];  Service: Orthopedics;  Laterality: Left;  . HYSTEROSCOPY N/A 09/02/2017   Procedure: HYSTEROSCOPY  to remove IUD;  Surgeon: Eldred Manges, MD;  Location: [facility];  Service: Gynecology;  Laterality: N/A;  . Liver biospy    . MASTECTOMY    . MASTECTOMY W/ SENTINEL NODE BIOPSY Bilateral 12/26/2015   Procedure: BILATERAL MASTECTOMY WITH LEFT SENTINEL LYMPH NODE BIOPSY;  Surgeon: Autumn Messing III, MD;  Location: [facility];  Service: General;  Laterality: Bilateral;  . Removal of IUD    . TUBAL LIGATION Bilateral 1995    FAMILY HISTORY Family History  Problem Relation Age of Onset  . Diabetes Mother   . Hypertension Mother   . Hypertension Father   . Diabetes Maternal Grandmother   . Hypertension Sister   . Cancer Paternal Grandmother        colon  . Colon cancer Paternal Grandmother   . Esophageal cancer Neg Hx   . Stomach cancer Neg Hx   . Rectal cancer Neg Hx   The patient's parents are still living, in their early 77s as of June 2017. The patient has one brother, 2 sisters. On the maternal side there is a history of uterine and prostate cancer. On the paternal side there is a history of colon cancer and possibly uterine cancer. There is no history of breast or ovarian cancer in the family.   GYNECOLOGIC HISTORY:  No LMP recorded. (Menstrual status: IUD).  Menarche age 39. She still having regular periods. She is GX P1, first pregnancy age 68. She used oral contraceptives for a few years remotely, with no complications. She is currently on a Mirena IUD and also is status post bilateral tubal ligation.  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 Her husband Debbie [name] is a Administrator. Their son Debbie [name] is a radio DJ. The patient has no grandchildren. She is not a Ambulance person.   ADVANCED DIRECTIVES: Not in place   HEALTH  MAINTENANCE: Social History   Tobacco Use  . Smoking status: Never Smoker  . Smokeless tobacco: Never Used  Substance Use Topics  . Alcohol use: No  . Drug use: No     Colonoscopy: January 2016   PAP: January 2016   Bone density: August 2017 showed a T score of +0.7 normal  Lipid panel:  Allergies  Allergen Reactions  . Ace Inhibitors Cough  . Atorvastatin Other (See Comments)    Joint pain  . Simvastatin Other (See Comments)    Joint pain    Current Outpatient Medications  Medication Sig Dispense Refill  . acetaminophen (TYLENOL) 325 MG tablet Take 325 mg by mouth every 6 (six) hours as needed for mild pain or headache.     . anastrozole (ARIMIDEX) 1 MG tablet Take 1 tablet (1 mg total) by  mouth daily. 90 tablet 4  . Calcium-Magnesium 250-125 MG TABS Take 1 tablet by mouth daily. 120 each 4  . cetirizine (ZYRTEC ALLERGY) 10 MG tablet Take 10 mg by mouth daily as needed for allergies.     Marland Kitchen dexamethasone (DECADRON) 0.5 MG/5ML solution Swish 31m in mouth for 260m and spit out. Use 4 times daily for 8 weeks, start with Afinitor. Avoid eating/drinking for 1hr after rinse. (Patient taking differently: Take 1 mg by mouth 4 (four) times daily. Swish 1044mn mouth for 2mi66mnd spit out. Use 4 times daily for 8 weeks, start with Afinitor. Avoid eating/drinking for 1hr after rinse.) 500 mL 4  . docusate sodium (COLACE) 100 MG capsule Take 200-400 mg by mouth daily.    . evMarland Kitchenrolimus (AFINITOR) 10 MG tablet Take 1 tablet (10 mg total) by mouth daily. Take at approximately the same time each day with a glass of water. 28 tablet 6  . hydrochlorothiazide (MICROZIDE) 12.5 MG capsule Take 12.5 mg by mouth every morning.     . hydrocortisone cream 1 % Apply 1 application topically daily as needed (for rash).    . ibMarland Kitchenprofen (ADVIL,MOTRIN) 600 MG tablet Take 1 tablet (600 mg total) by mouth every 6 (six) hours as needed for moderate pain. 60 tablet 0  . levothyroxine (SYNTHROID, LEVOTHROID) 137 MCG  tablet Take 137 mcg by mouth daily before breakfast.     . LIVALO 2 MG TABS Take 2 mg by mouth daily.   6  . losartan (COZAAR) 100 MG tablet Take 100 mg by mouth every morning.     . Multiple Vitamin (MULTIVITAMIN) tablet Take 1 tablet by mouth daily.    . omMarland Kitchenga-3 acid ethyl esters (LOVAZA) 1 g capsule Take 1 g by mouth 2 (two) times daily.     . oxMarland KitchenCODONE (ROXICODONE) 5 MG immediate release tablet Take 1 tablet (5 mg total) by mouth every 6 (six) hours as needed for severe pain. 15 tablet 0  . potassium chloride SA (K-DUR,KLOR-CON) 20 MEQ tablet Take 40 mEq by mouth daily.     . prochlorperazine (COMPAZINE) 10 MG tablet Take 1 tablet (10 mg total) by mouth every 6 (six) hours as needed for nausea or vomiting. 30 tablet 0  . Vitamin D, Cholecalciferol, 1000 units CAPS Take 1 tablet by mouth daily. (Patient taking differently: Take 1,000 Units by mouth daily. ) 90 capsule 4   No current facility-administered medications for this visit.     OBJECTIVE:  Vitals:   02/08/18 1148  BP: 128/83  Pulse: 78  Resp: 18  Temp: 98.6 F (37 C)     Body mass index is 41.56 kg/m.    ECOG FS:2 - Symptomatic, <50% confined to bed  Filed Weights   02/08/18 1148  Weight: 261 lb 6.4 oz (118.6 kg)   GENERAL: Patient is a well appearing female in no acute distress HEENT:  Sclerae anicteric.  Oropharynx clear and moist. No ulcerations or evidence of oropharyngeal candidiasis. Neck is supple.  NODES:  No cervical, supraclavicular, or axillary lymphadenopathy palpated.  BREAST EXAM:  S/p bilateral mastectomy, no sign of local recurrence LUNGS:  Clear to auscultation bilaterally.  No wheezes or rhonchi. HEART:  Regular rate and rhythm. No murmur appreciated. ABDOMEN:  Soft, nontender.  Positive, normoactive bowel sounds. No organomegaly palpated. MSK:  No focal spinal tenderness to palpation. Full range of motion bilaterally in the upper extremities. EXTREMITIES:  No peripheral edema.   SKIN:  Clear with no  obvious  rashes or skin changes. No nail dyscrasia. NEURO:  Nonfocal. Well oriented.  Appropriate affect.   LAB RESULTS:  CMP     Component Value Date/Time   NA 142 02/08/2018 1014   NA 140 07/08/2016 0948   K 3.4 (L) 02/08/2018 1014   K 4.1 07/08/2016 0948   CL 105 02/08/2018 1014   CO2 27 02/08/2018 1014   CO2 24 07/08/2016 0948   GLUCOSE 111 (H) 02/08/2018 1014   GLUCOSE 89 07/08/2016 0948   BUN 11 02/08/2018 1014   BUN 11.9 07/08/2016 0948   CREATININE 1.05 (H) 02/08/2018 1014   CREATININE 0.90 06/22/2017 1147   CREATININE 0.9 07/08/2016 0948   CALCIUM 9.8 02/08/2018 1014   CALCIUM 10.0 07/08/2016 0948   PROT 7.8 02/08/2018 1014   PROT 7.6 07/08/2016 0948   ALBUMIN 3.4 (L) 02/08/2018 1014   ALBUMIN 3.6 07/08/2016 0948   AST 36 02/08/2018 1014   AST 31 06/22/2017 1147   AST 22 07/08/2016 0948   ALT 40 02/08/2018 1014   ALT 45 06/22/2017 1147   ALT 27 07/08/2016 0948   ALKPHOS 149 (H) 02/08/2018 1014   ALKPHOS 115 07/08/2016 0948   BILITOT 0.4 02/08/2018 1014   BILITOT 0.4 06/22/2017 1147   BILITOT 0.35 07/08/2016 0948   GFRNONAA 59 (L) 02/08/2018 1014   GFRNONAA >60 06/22/2017 1147   GFRAA >60 02/08/2018 1014   GFRAA >60 06/22/2017 1147    INo results found for: SPEP, UPEP  Lab Results  Component Value Date   WBC 4.9 02/08/2018   NEUTROABS 3.4 02/08/2018   HGB 13.6 02/08/2018   HCT 40.1 02/08/2018   MCV 100.8 02/08/2018   PLT 350 02/08/2018      Chemistry      Component Value Date/Time   NA 142 02/08/2018 1014   NA 140 07/08/2016 0948   K 3.4 (L) 02/08/2018 1014   K 4.1 07/08/2016 0948   CL 105 02/08/2018 1014   CO2 27 02/08/2018 1014   CO2 24 07/08/2016 0948   BUN 11 02/08/2018 1014   BUN 11.9 07/08/2016 0948   CREATININE 1.05 (H) 02/08/2018 1014   CREATININE 0.90 06/22/2017 1147   CREATININE 0.9 07/08/2016 0948      Component Value Date/Time   CALCIUM 9.8 02/08/2018 1014   CALCIUM 10.0 07/08/2016 0948   ALKPHOS 149 (H) 02/08/2018 1014     ALKPHOS 115 07/08/2016 0948   AST 36 02/08/2018 1014   AST 31 06/22/2017 1147   AST 22 07/08/2016 0948   ALT 40 02/08/2018 1014   ALT 45 06/22/2017 1147   ALT 27 07/08/2016 0948   BILITOT 0.4 02/08/2018 1014   BILITOT 0.4 06/22/2017 1147   BILITOT 0.35 07/08/2016 0948       No results found for: LABCA2  No components found for: LABCA125  No results for input(s): INR in the last 168 hours.  Urinalysis No results found for: COLORURINE, APPEARANCEUR, LABSPEC, PHURINE, GLUCOSEU, HGBUR, BILIRUBINUR, KETONESUR, PROTEINUR, UROBILINOGEN, NITRITE, LEUKOCYTESUR   STUDIES: Ct Femur Left Wo Contrast  Result Date: 01/19/2018 CLINICAL DATA:  Metastatic breast cancer. Femur pain. Abnormal bone scan. EXAM: CT OF THE LOWER LEFT EXTREMITY WITHOUT CONTRAST TECHNIQUE: Multidetector CT imaging of the lower left extremity was performed according to the standard protocol. COMPARISON:  Bone scan dated 12/24/2017 FINDINGS: Bones/Joint/Cartilage There are multiple lytic lesions at the left hip including in the left ilium, posterior aspect of the left acetabulum, left ischial tuberosity, and in the left ischium at the base  of the right superior pubic ramus best seen on image 4 of series 603. Largest lesion is in the intertrochanteric region of the proximal left femur measuring approximately 5.5 x 2.8 x 3.1 cm. There is a slight permeative pattern of the inferior aspect of the femoral cortex in the intertrochanteric region. No significant abnormality of the distal femur. Soft tissues The muscles and other soft tissues appear normal.  No adenopathy. IMPRESSION: Multiple lytic metastases in the left ilium and ischium as well as a more prominent lytic metastasis in the intertrochanteric region of the proximal left femur. Electronically Signed   By: Lorriane Shire M.D.   On: 01/19/2018 13:04   Dg C-arm 1-60 Min  Result Date: 01/28/2018 CLINICAL DATA:  ORIF. EXAM: DG C-ARM 61-120 MIN COMPARISON:  CT 01/19/2018.  FINDINGS: ORIF left femur. Hardware intact. Anatomic alignment. 4 images obtained. 1 minutes 51 seconds fluoroscopy time utilized. IMPRESSION: ORIF left femur. Electronically Signed   By: Marcello Moores  Register   On: 01/28/2018 09:24   Dg Femur Min 2 Views Left  Result Date: 01/28/2018 CLINICAL DATA:  IM nail EXAM: LEFT FEMUR 2 VIEWS COMPARISON:  CT 01/19/2018 FINDINGS: Placement of hip screw and intramedullary nail within the left femur. No hardware bony complicating feature. IMPRESSION: As above. Electronically Signed   By: Rolm Baptise M.D.   On: 01/28/2018 09:21    ELIGIBLE FOR AVAILABLE RESEARCH PROTOCOL: PALLAS, but inelegible because of Mirena IUD  ASSESSMENT: 52 y.o. [place] woman status post right breast upper inner quadrant biopsy 10/15/2015 for a clinical T2-T3 N0, stage II invasive ductal carcinoma, estrogen and progesterone receptor positive, HER-2 nonamplified, with an Mib-1 of 70%  (1) status post right lumpectomy and sentinel lymph node sampling with oncoplastic breast reduction 11/28/2015 for a pT2 pN1, stage IIB invasive ductal carcinoma, grade 1, with negative margins (1/1 sentinel node positive)  (a) reclassified as stage IIA in the 2018 prognostic revision   (2) left reduction mammoplasty 11/28/2015 unexpectedly found a pT1c pNX invasive ductal carcinoma, grade 1, estrogen and progesterone receptor positive, HER-2 not amplified, with an MIB-1 of 3%  (3) Mammaprint from the right sided breast cancer was "low risk", predicting a 98% chance of no disease recurrence within 5 years with anti-estrogens as the only systemic therapy. It also predicts minimal to no benefit from chemotherapy  (4) status post bilateral mastectomies with bilateral sentinel lymph node sampling 12/26/2015, showing  (a) on the right, no residual carcinoma (0/3 nodes involved)  (b) on the left, focal ductal carcinoma in situ, 0.2 cm, with negative margins; (0/4 nodes involved)  (5) adjuvant radiation 02/11/16  - 03/27/16    1) Right chest wall: 50.4 Gy in 28 fractions               2) Right chest wall boost: 10 Gy in 5 fractions   (6) started on tamoxifen neoadjuvantly 10/23/2015 to allow for expected delays in definitive local treatment  (a) Mirena IUD in place  (b) tamoxifen discontinued 08/13/2017, with progression  (7) since she had 4 lymph nodes removed from each axilla but also receive radiation on the right, lab draws should preferentially be obtained from the left arm   METASTATIC DISEASE: April 2019 (8) CT scan of the abdomen and pelvis 08/04/2017, MRI of the liver and total spine MRI 08/11/2017 and CT scan of the chest and brain on 08/13/2017 showed liver and bone metastases  (a) liver biopsy 09/07/2017 confirms estrogen receptor positive, progesterone receptor negative, HER-2 negative metastatic breast cancer  (  b) chest CT scan 08/13/2017 shows multiple bilateral pulmonary nodules.  (c) CT of the brain with and without contrast 08/26/2017 shows no evidence of metastatic disease to the brain  (d) CA 27-29 is informative  (9) fulvestrant started 08/17/2017  (a) palbociclib started 08/31/2017  (b) fulvestrant and palbociclib discontinued 01/04/2018 with evidence of progression in the liver  (10) denosumab/Xgeva started 09/14/2017  (11) anastrozole started 01/04/2018  (a) everolimus to started in early 01/2018  (12) 01/28/18 prophylactic left femur intramedullary nail surgery for impending left femur pathologic fracture  PLAN: Ottie is doing well today.  She is tolerating the Anastrozole and Everolimus very well.  She denies any issues with taking this other than some mouth sores that she has used gargles for and have since resolved/aren't evident on todays exam.  She will continue this.  She will also continue with Xgeva as she is tolerating this well also.    I reviewed her labs with her which are normal.  Her CA27-29 is declining.  I will reach out to Dr. [name] on Keani's behalf  to readdress the radiation question.    Marcy will return in 3 weeks for labs, f/u and her next injection.  She knows to call for any other issues that may develop before the next visit.  She will be due for repeat imaging in 04/2018.  A total of (30) minutes of face-to-face time was spent with this patient with greater than 50% of that time in counseling and care-coordination.    Wilber Bihari, NP  02/08/18 12:17 PM Medical Oncology and Hematology [facility] [address] [place], [place] [address] Tel. [phone]    Fax. [phone]

## 2018-02-08 NOTE — Patient Instructions (Signed)

## 2018-02-09 ENCOUNTER — Ambulatory Visit
Admission: RE | Admit: 2018-02-09 | Discharge: 2018-02-09 | Disposition: A | Discharge status: Home / Self Care | Payer: 59 | Source: Ambulatory Visit | Attending: Radiation Oncology | Admitting: Radiation Oncology

## 2018-02-09 DIAGNOSIS — C7951 Secondary malignant neoplasm of bone: Secondary | ICD-10-CM | POA: Insufficient documentation

## 2018-02-09 DIAGNOSIS — C787 Secondary malignant neoplasm of liver and intrahepatic bile duct: Secondary | ICD-10-CM | POA: Insufficient documentation

## 2018-02-09 DIAGNOSIS — C7802 Secondary malignant neoplasm of left lung: Secondary | ICD-10-CM | POA: Diagnosis not present

## 2018-02-09 DIAGNOSIS — C50211 Malignant neoplasm of upper-inner quadrant of right female breast: Secondary | ICD-10-CM | POA: Diagnosis not present

## 2018-02-09 DIAGNOSIS — Z510 Encounter for antineoplastic radiation therapy: Secondary | ICD-10-CM | POA: Diagnosis not present

## 2018-02-09 DIAGNOSIS — M25552 Pain in left hip: Secondary | ICD-10-CM | POA: Diagnosis not present

## 2018-02-09 DIAGNOSIS — C7801 Secondary malignant neoplasm of right lung: Secondary | ICD-10-CM | POA: Insufficient documentation

## 2018-02-09 NOTE — Progress Notes (Signed)
  Radiation Oncology         [phone]) [phone] ________________________________  Name: Debbie [name] MRN: [id number]  Date: 02/09/2018  DOB: 1966/01/09  SIMULATION AND TREATMENT PLANNING NOTE  Outpatient  DIAGNOSIS:     ICD-10-CM   1. Bone metastases ([place]) C79.51     NARRATIVE:  The patient was brought to the [facility].  Identity was confirmed.  All relevant records and images related to the planned course of therapy were reviewed.  The patient freely provided informed written consent to proceed with treatment after reviewing the details related to the planned course of therapy. The consent form was witnessed and verified by the simulation staff.    Then, the patient was set-up in a stable reproducible  supine position for radiation therapy.  CT images were obtained.  Surface markings were placed.  The CT images were loaded into the planning software.    TREATMENT PLANNING NOTE: Treatment planning then occurred.  The radiation prescription was entered and confirmed.    A total of 4 medically necessary complex treatment devices were fabricated and supervised by me, in the form of a vaclock and  3 fields with MLCs to block bowel , bladder, rectum. MORE FIELDS WITH MLCs MAY BE ADDED IN DOSIMETRY for dose homogeneity.  I have requested : Isodose Plan.    The patient will receive 30 Gy in 10 fractions to the left femur and a portion of the left ilium. ----------------------------------------------------------------------  [name], MD

## 2018-02-10 ENCOUNTER — Ambulatory Visit: Payer: 59 | Admitting: Physical Therapy

## 2018-02-10 MED FILL — LEVOTHYROXINE 137 MCG TABLE: 137 | 90 days supply | Qty: 90 | Fill #2

## 2018-02-14 DIAGNOSIS — Z510 Encounter for antineoplastic radiation therapy: Secondary | ICD-10-CM | POA: Diagnosis not present

## 2018-02-14 DIAGNOSIS — C7801 Secondary malignant neoplasm of right lung: Secondary | ICD-10-CM | POA: Diagnosis not present

## 2018-02-14 DIAGNOSIS — C787 Secondary malignant neoplasm of liver and intrahepatic bile duct: Secondary | ICD-10-CM | POA: Diagnosis not present

## 2018-02-14 DIAGNOSIS — C7951 Secondary malignant neoplasm of bone: Secondary | ICD-10-CM | POA: Diagnosis not present

## 2018-02-14 DIAGNOSIS — C50211 Malignant neoplasm of upper-inner quadrant of right female breast: Secondary | ICD-10-CM | POA: Diagnosis not present

## 2018-02-14 DIAGNOSIS — C7802 Secondary malignant neoplasm of left lung: Secondary | ICD-10-CM | POA: Diagnosis not present

## 2018-02-15 DIAGNOSIS — C50912 Malignant neoplasm of unspecified site of left female breast: Secondary | ICD-10-CM | POA: Diagnosis not present

## 2018-02-15 DIAGNOSIS — C50911 Malignant neoplasm of unspecified site of right female breast: Secondary | ICD-10-CM | POA: Diagnosis not present

## 2018-02-15 NOTE — Progress Notes (Signed)
FMLA for spouse, Kadelyn Dimascio, successfully faxed to [place] at [phone]. Mailed copy to patient address on file.

## 2018-02-16 ENCOUNTER — Ambulatory Visit
Admission: RE | Admit: 2018-02-16 | Discharge: 2018-02-16 | Disposition: A | Discharge status: Home / Self Care | Payer: 59 | Source: Ambulatory Visit | Attending: Radiation Oncology | Admitting: Radiation Oncology

## 2018-02-16 DIAGNOSIS — C7951 Secondary malignant neoplasm of bone: Secondary | ICD-10-CM | POA: Diagnosis not present

## 2018-02-16 DIAGNOSIS — C50211 Malignant neoplasm of upper-inner quadrant of right female breast: Secondary | ICD-10-CM | POA: Diagnosis not present

## 2018-02-16 DIAGNOSIS — C787 Secondary malignant neoplasm of liver and intrahepatic bile duct: Secondary | ICD-10-CM | POA: Diagnosis not present

## 2018-02-16 DIAGNOSIS — Z510 Encounter for antineoplastic radiation therapy: Secondary | ICD-10-CM | POA: Diagnosis not present

## 2018-02-16 DIAGNOSIS — C7802 Secondary malignant neoplasm of left lung: Secondary | ICD-10-CM | POA: Diagnosis not present

## 2018-02-16 DIAGNOSIS — C7801 Secondary malignant neoplasm of right lung: Secondary | ICD-10-CM | POA: Diagnosis not present

## 2018-02-17 ENCOUNTER — Ambulatory Visit
Admission: RE | Admit: 2018-02-17 | Discharge: 2018-02-17 | Disposition: A | Discharge status: Home / Self Care | Payer: 59 | Source: Ambulatory Visit | Attending: Radiation Oncology | Admitting: Radiation Oncology

## 2018-02-17 DIAGNOSIS — C7951 Secondary malignant neoplasm of bone: Secondary | ICD-10-CM | POA: Diagnosis not present

## 2018-02-17 DIAGNOSIS — M25552 Pain in left hip: Secondary | ICD-10-CM | POA: Diagnosis not present

## 2018-02-17 DIAGNOSIS — Z510 Encounter for antineoplastic radiation therapy: Secondary | ICD-10-CM | POA: Diagnosis not present

## 2018-02-17 DIAGNOSIS — C7801 Secondary malignant neoplasm of right lung: Secondary | ICD-10-CM | POA: Diagnosis not present

## 2018-02-17 DIAGNOSIS — C7802 Secondary malignant neoplasm of left lung: Secondary | ICD-10-CM | POA: Diagnosis not present

## 2018-02-17 DIAGNOSIS — C50211 Malignant neoplasm of upper-inner quadrant of right female breast: Secondary | ICD-10-CM | POA: Diagnosis not present

## 2018-02-17 DIAGNOSIS — C787 Secondary malignant neoplasm of liver and intrahepatic bile duct: Secondary | ICD-10-CM | POA: Diagnosis not present

## 2018-02-18 ENCOUNTER — Ambulatory Visit
Admission: RE | Admit: 2018-02-18 | Discharge: 2018-02-18 | Disposition: A | Discharge status: Home / Self Care | Payer: 59 | Source: Ambulatory Visit | Attending: Radiation Oncology | Admitting: Radiation Oncology

## 2018-02-18 DIAGNOSIS — C7802 Secondary malignant neoplasm of left lung: Secondary | ICD-10-CM | POA: Diagnosis not present

## 2018-02-18 DIAGNOSIS — Z510 Encounter for antineoplastic radiation therapy: Secondary | ICD-10-CM | POA: Diagnosis not present

## 2018-02-18 DIAGNOSIS — C7801 Secondary malignant neoplasm of right lung: Secondary | ICD-10-CM | POA: Diagnosis not present

## 2018-02-18 DIAGNOSIS — C7951 Secondary malignant neoplasm of bone: Secondary | ICD-10-CM | POA: Diagnosis not present

## 2018-02-18 DIAGNOSIS — C787 Secondary malignant neoplasm of liver and intrahepatic bile duct: Secondary | ICD-10-CM | POA: Diagnosis not present

## 2018-02-18 DIAGNOSIS — C50211 Malignant neoplasm of upper-inner quadrant of right female breast: Secondary | ICD-10-CM | POA: Diagnosis not present

## 2018-02-21 ENCOUNTER — Ambulatory Visit
Admission: RE | Admit: 2018-02-21 | Discharge: 2018-02-21 | Disposition: A | Discharge status: Home / Self Care | Payer: 59 | Source: Ambulatory Visit | Attending: Radiation Oncology | Admitting: Radiation Oncology

## 2018-02-21 DIAGNOSIS — C7801 Secondary malignant neoplasm of right lung: Secondary | ICD-10-CM | POA: Diagnosis not present

## 2018-02-21 DIAGNOSIS — C7951 Secondary malignant neoplasm of bone: Secondary | ICD-10-CM | POA: Diagnosis not present

## 2018-02-21 DIAGNOSIS — Z510 Encounter for antineoplastic radiation therapy: Secondary | ICD-10-CM | POA: Diagnosis not present

## 2018-02-21 DIAGNOSIS — C7802 Secondary malignant neoplasm of left lung: Secondary | ICD-10-CM | POA: Diagnosis not present

## 2018-02-21 DIAGNOSIS — C50211 Malignant neoplasm of upper-inner quadrant of right female breast: Secondary | ICD-10-CM | POA: Diagnosis not present

## 2018-02-21 DIAGNOSIS — C787 Secondary malignant neoplasm of liver and intrahepatic bile duct: Secondary | ICD-10-CM | POA: Diagnosis not present

## 2018-02-22 ENCOUNTER — Ambulatory Visit
Admission: RE | Admit: 2018-02-22 | Discharge: 2018-02-22 | Disposition: A | Discharge status: Home / Self Care | Payer: 59 | Source: Ambulatory Visit | Attending: Radiation Oncology | Admitting: Radiation Oncology

## 2018-02-22 DIAGNOSIS — C7801 Secondary malignant neoplasm of right lung: Secondary | ICD-10-CM | POA: Diagnosis not present

## 2018-02-22 DIAGNOSIS — C7951 Secondary malignant neoplasm of bone: Secondary | ICD-10-CM | POA: Diagnosis not present

## 2018-02-22 DIAGNOSIS — C7802 Secondary malignant neoplasm of left lung: Secondary | ICD-10-CM | POA: Diagnosis not present

## 2018-02-22 DIAGNOSIS — C50211 Malignant neoplasm of upper-inner quadrant of right female breast: Secondary | ICD-10-CM | POA: Diagnosis not present

## 2018-02-22 DIAGNOSIS — C787 Secondary malignant neoplasm of liver and intrahepatic bile duct: Secondary | ICD-10-CM | POA: Diagnosis not present

## 2018-02-22 DIAGNOSIS — Z510 Encounter for antineoplastic radiation therapy: Secondary | ICD-10-CM | POA: Diagnosis not present

## 2018-02-23 ENCOUNTER — Ambulatory Visit
Admission: RE | Admit: 2018-02-23 | Discharge: 2018-02-23 | Disposition: A | Discharge status: Home / Self Care | Payer: 59 | Source: Ambulatory Visit | Attending: Radiation Oncology | Admitting: Radiation Oncology

## 2018-02-23 DIAGNOSIS — C787 Secondary malignant neoplasm of liver and intrahepatic bile duct: Secondary | ICD-10-CM | POA: Diagnosis not present

## 2018-02-23 DIAGNOSIS — C7951 Secondary malignant neoplasm of bone: Secondary | ICD-10-CM | POA: Diagnosis not present

## 2018-02-23 DIAGNOSIS — C7801 Secondary malignant neoplasm of right lung: Secondary | ICD-10-CM | POA: Diagnosis not present

## 2018-02-23 DIAGNOSIS — M25552 Pain in left hip: Secondary | ICD-10-CM | POA: Diagnosis not present

## 2018-02-23 DIAGNOSIS — C7802 Secondary malignant neoplasm of left lung: Secondary | ICD-10-CM | POA: Diagnosis not present

## 2018-02-23 DIAGNOSIS — Z510 Encounter for antineoplastic radiation therapy: Secondary | ICD-10-CM | POA: Diagnosis not present

## 2018-02-23 DIAGNOSIS — C50211 Malignant neoplasm of upper-inner quadrant of right female breast: Secondary | ICD-10-CM | POA: Diagnosis not present

## 2018-02-24 ENCOUNTER — Ambulatory Visit
Admission: RE | Admit: 2018-02-24 | Discharge: 2018-02-24 | Disposition: A | Discharge status: Home / Self Care | Payer: 59 | Source: Ambulatory Visit | Attending: Radiation Oncology | Admitting: Radiation Oncology

## 2018-02-24 DIAGNOSIS — C7801 Secondary malignant neoplasm of right lung: Secondary | ICD-10-CM | POA: Diagnosis not present

## 2018-02-24 DIAGNOSIS — C7802 Secondary malignant neoplasm of left lung: Secondary | ICD-10-CM | POA: Diagnosis not present

## 2018-02-24 DIAGNOSIS — C50211 Malignant neoplasm of upper-inner quadrant of right female breast: Secondary | ICD-10-CM | POA: Diagnosis not present

## 2018-02-24 DIAGNOSIS — C787 Secondary malignant neoplasm of liver and intrahepatic bile duct: Secondary | ICD-10-CM | POA: Diagnosis not present

## 2018-02-24 DIAGNOSIS — C7951 Secondary malignant neoplasm of bone: Secondary | ICD-10-CM | POA: Diagnosis not present

## 2018-02-24 DIAGNOSIS — Z510 Encounter for antineoplastic radiation therapy: Secondary | ICD-10-CM | POA: Diagnosis not present

## 2018-02-25 ENCOUNTER — Ambulatory Visit
Admission: RE | Admit: 2018-02-25 | Discharge: 2018-02-25 | Disposition: A | Discharge status: Home / Self Care | Payer: 59 | Source: Ambulatory Visit | Attending: Radiation Oncology | Admitting: Radiation Oncology

## 2018-02-25 DIAGNOSIS — C7801 Secondary malignant neoplasm of right lung: Secondary | ICD-10-CM | POA: Diagnosis not present

## 2018-02-25 DIAGNOSIS — C50211 Malignant neoplasm of upper-inner quadrant of right female breast: Secondary | ICD-10-CM | POA: Diagnosis not present

## 2018-02-25 DIAGNOSIS — C7802 Secondary malignant neoplasm of left lung: Secondary | ICD-10-CM | POA: Diagnosis not present

## 2018-02-25 DIAGNOSIS — C7951 Secondary malignant neoplasm of bone: Secondary | ICD-10-CM | POA: Diagnosis not present

## 2018-02-25 DIAGNOSIS — C787 Secondary malignant neoplasm of liver and intrahepatic bile duct: Secondary | ICD-10-CM | POA: Diagnosis not present

## 2018-02-25 DIAGNOSIS — Z510 Encounter for antineoplastic radiation therapy: Secondary | ICD-10-CM | POA: Diagnosis not present

## 2018-02-28 ENCOUNTER — Ambulatory Visit
Admission: RE | Admit: 2018-02-28 | Discharge: 2018-02-28 | Disposition: A | Discharge status: Home / Self Care | Payer: 59 | Source: Ambulatory Visit | Attending: Radiation Oncology | Admitting: Radiation Oncology

## 2018-02-28 DIAGNOSIS — C50211 Malignant neoplasm of upper-inner quadrant of right female breast: Secondary | ICD-10-CM | POA: Diagnosis not present

## 2018-02-28 DIAGNOSIS — C787 Secondary malignant neoplasm of liver and intrahepatic bile duct: Secondary | ICD-10-CM | POA: Diagnosis not present

## 2018-02-28 DIAGNOSIS — C7801 Secondary malignant neoplasm of right lung: Secondary | ICD-10-CM | POA: Diagnosis not present

## 2018-02-28 DIAGNOSIS — C7951 Secondary malignant neoplasm of bone: Secondary | ICD-10-CM | POA: Diagnosis not present

## 2018-02-28 DIAGNOSIS — Z510 Encounter for antineoplastic radiation therapy: Secondary | ICD-10-CM | POA: Diagnosis not present

## 2018-02-28 DIAGNOSIS — C7802 Secondary malignant neoplasm of left lung: Secondary | ICD-10-CM | POA: Diagnosis not present

## 2018-03-01 ENCOUNTER — Inpatient Hospital Stay: Payer: 59

## 2018-03-01 ENCOUNTER — Encounter: Payer: Self-pay | Admitting: Radiation Oncology

## 2018-03-01 ENCOUNTER — Ambulatory Visit: Payer: 59

## 2018-03-01 ENCOUNTER — Telehealth: Payer: Self-pay | Admitting: Adult Health

## 2018-03-01 ENCOUNTER — Encounter: Payer: Self-pay | Admitting: Adult Health

## 2018-03-01 ENCOUNTER — Ambulatory Visit
Admission: RE | Admit: 2018-03-01 | Discharge: 2018-03-01 | Disposition: A | Discharge status: Home / Self Care | Payer: 59 | Source: Ambulatory Visit | Attending: Radiation Oncology | Admitting: Radiation Oncology

## 2018-03-01 ENCOUNTER — Inpatient Hospital Stay (HOSPITAL_BASED_OUTPATIENT_CLINIC_OR_DEPARTMENT_OTHER): Payer: 59 | Admitting: Adult Health

## 2018-03-01 VITALS — BP 144/74 | HR 86 | Temp 98.6°F | Resp 18 | Ht 66.5 in

## 2018-03-01 DIAGNOSIS — Z170 Estrogen receptor positive status [ER+]: Secondary | ICD-10-CM

## 2018-03-01 DIAGNOSIS — C50812 Malignant neoplasm of overlapping sites of left female breast: Secondary | ICD-10-CM

## 2018-03-01 DIAGNOSIS — R918 Other nonspecific abnormal finding of lung field: Secondary | ICD-10-CM | POA: Diagnosis not present

## 2018-03-01 DIAGNOSIS — C787 Secondary malignant neoplasm of liver and intrahepatic bile duct: Secondary | ICD-10-CM

## 2018-03-01 DIAGNOSIS — C7801 Secondary malignant neoplasm of right lung: Secondary | ICD-10-CM | POA: Diagnosis not present

## 2018-03-01 DIAGNOSIS — C7951 Secondary malignant neoplasm of bone: Secondary | ICD-10-CM | POA: Diagnosis not present

## 2018-03-01 DIAGNOSIS — I10 Essential (primary) hypertension: Secondary | ICD-10-CM | POA: Diagnosis not present

## 2018-03-01 DIAGNOSIS — Z510 Encounter for antineoplastic radiation therapy: Secondary | ICD-10-CM | POA: Diagnosis not present

## 2018-03-01 DIAGNOSIS — C50211 Malignant neoplasm of upper-inner quadrant of right female breast: Secondary | ICD-10-CM

## 2018-03-01 DIAGNOSIS — Z9013 Acquired absence of bilateral breasts and nipples: Secondary | ICD-10-CM

## 2018-03-01 DIAGNOSIS — Z79811 Long term (current) use of aromatase inhibitors: Secondary | ICD-10-CM

## 2018-03-01 DIAGNOSIS — Z79899 Other long term (current) drug therapy: Secondary | ICD-10-CM | POA: Diagnosis not present

## 2018-03-01 DIAGNOSIS — C7802 Secondary malignant neoplasm of left lung: Secondary | ICD-10-CM | POA: Diagnosis not present

## 2018-03-01 DIAGNOSIS — C50112 Malignant neoplasm of central portion of left female breast: Secondary | ICD-10-CM

## 2018-03-01 DIAGNOSIS — C50811 Malignant neoplasm of overlapping sites of right female breast: Secondary | ICD-10-CM

## 2018-03-01 DIAGNOSIS — C50012 Malignant neoplasm of nipple and areola, left female breast: Secondary | ICD-10-CM

## 2018-03-01 DIAGNOSIS — C50011 Malignant neoplasm of nipple and areola, right female breast: Secondary | ICD-10-CM

## 2018-03-01 LAB — COMPREHENSIVE METABOLIC PANEL
ALT: 30 U/L (ref 0–44)
AST: 23 U/L (ref 15–41)
Albumin: 3.3 g/dL — ABNORMAL LOW (ref 3.5–5.0)
Alkaline Phosphatase: 149 U/L — ABNORMAL HIGH (ref 38–126)
Anion gap: 13 (ref 5–15)
BUN: 8 mg/dL (ref 6–20)
CO2: 22 mmol/L (ref 22–32)
Calcium: 9.2 mg/dL (ref 8.9–10.3)
Chloride: 107 mmol/L (ref 98–111)
Creatinine, Ser: 0.74 mg/dL (ref 0.44–1.00)
GFR calc Af Amer: >60 mL/min (ref >60)
GFR calc non Af Amer: >60 mL/min (ref >60)
Glucose, Bld: 110 mg/dL — ABNORMAL HIGH (ref 70–99)
Potassium: 3.6 mmol/L (ref 3.5–5.1)
Sodium: 142 mmol/L (ref 135–145)
Total Bilirubin: 0.3 mg/dL (ref 0.3–1.2)
Total Protein: 7.2 g/dL (ref 6.5–8.1)

## 2018-03-01 LAB — CBC WITH DIFFERENTIAL/PLATELET
Abs Immature Granulocytes: 0.02 10*3/uL (ref 0.00–0.07)
Basophils Absolute: 0 10*3/uL (ref 0.0–0.1)
Basophils Relative: 0 %
Eosinophils Absolute: 0.1 10*3/uL (ref 0.0–0.5)
Eosinophils Relative: 4 %
HCT: 36.5 % (ref 36.0–46.0)
Hemoglobin: 12 g/dL (ref 12.0–15.0)
Immature Granulocytes: 1 %
Lymphocytes Relative: 10 %
Lymphs Abs: 0.3 10*3/uL — ABNORMAL LOW (ref 0.7–4.0)
MCH: 31.8 pg (ref 26.0–34.0)
MCHC: 32.9 g/dL (ref 30.0–36.0)
MCV: 96.8 fL (ref 80.0–100.0)
Monocytes Absolute: 0.3 10*3/uL (ref 0.1–1.0)
Monocytes Relative: 10 %
Neutro Abs: 2.1 10*3/uL (ref 1.7–7.7)
Neutrophils Relative %: 75 %
Platelets: 223 10*3/uL (ref 150–400)
RBC: 3.77 MIL/uL — ABNORMAL LOW (ref 3.87–5.11)
RDW: 13.7 % (ref 11.5–15.5)
WBC: 2.8 10*3/uL — ABNORMAL LOW (ref 4.0–10.5)
nRBC: 0 % (ref 0.0–0.2)

## 2018-03-01 MED ORDER — DENOSUMAB 120 MG/1.7ML ~~LOC~~ SOLN
SUBCUTANEOUS | Status: AC
  Filled 2018-03-01: qty 1.7

## 2018-03-01 MED ORDER — DENOSUMAB 120 MG/1.7ML ~~LOC~~ SOLN
120.0000 mg | Freq: Once | SUBCUTANEOUS | Status: AC
  Administered 2018-03-01: 120 mg via SUBCUTANEOUS

## 2018-03-01 NOTE — Progress Notes (Signed)
[facility]  Telephone:([phone]) [phone] Fax:(336) [phone]     ID: Debbie [name] DOB: [birth year]  MR#: [id number]  KRC#:[id number]  Patient Care Team: Kelton Pillar, MD as PCP - General (Family Medicine) Jacelyn Pi, MD as Consulting Physician (Endocrinology) Leo Grosser Seymour Bars, MD as Consulting Physician (Obstetrics and Gynecology) Jovita Kussmaul, MD as Consulting Physician (General Surgery) [name], Virgie Dad, MD as Consulting Physician (Oncology) [name], MD as Attending Physician (Radiation Oncology) Belva Crome, MD as Consulting Physician (Cardiology) Crissie Reese, MD as Consulting Physician (Plastic Surgery) [name], [name], NP as Nurse Practitioner (Hematology and Oncology) PCP: Kelton Pillar, MD OTHER MD:  CHIEF COMPLAINT: Estrogen receptor positive stage IV  breast cancer  CURRENT TREATMENT:  Anastrozole, affinitor; denosumab/Xgeva  INTERVAL HISTORY: Debbie [name] returns today for follow-up of her estrogen receptor positive stage IV breast cancer. She is taking Anastrozole and tolerating it well.      She is taking Everolimus and is tolerating it well.  She says she has a sore spot where her dentures are rubbing, and it isn't worsening.  She plans on contacting her dentist and reviewing with them.  She is without any mucositis.    She receives denosumab/Xgeva every 4 weeks with her most recent dose on .  Debbie [name] is undergoing radiation to the left femur area where her metastases are.  Today is the last treatment.  She notes a significant improvement in her pain, and is able to walk with less assistance (I.e. A cane).    REVIEW OF SYSTEMS: [name] notes that she has a change in the taste of her food.  She is no longer craving cokes because they taste like lead.  She says this is a good thing that her appetite is different due to this.  She doesn't have any weight loss.  She denies any fevers, chills, chest pain, palpitations, cough,  shortness of breath, headaches, vision changes, neuropathy.  She does note her left thigh feels hot since starting the radiation and she notes some swelling in her left upper thigh near her surgical incision.  This is stable.  No sign of infection such as redness, warmth or pain at the incision.  Otherwise a detailed ROS was conducted and was non contributory.  PAST MEDICAL HISTORY: Past Medical History:  Diagnosis Date  . Anxiety   . Arthritis   . Bone metastasis ([place])   . Breast cancer of upper-inner quadrant of right female breast [facility] oncologist-  dr [name]--- 04/ 2019 ER/PR+ Stage IV w/ metastatic disease to liver, total spine, lung, bone   dx 06/ 2017  right breast invasive ductal carcinoma, Stage IIB, ER/PR + (pT2 pN1), grade 1-- s/p  right breast lumpecotmy w/ sln bx's and bilateral breast reduction 11-28-2015/  left breast with reduction , dx invasive ductal ca, Grade 1 (pT1cpNX) ER/PR positive/  adjuvant radiation completed 03-27-2016 to right chest wall, 2018  reclassifiec Stage IIA  . Cancer, metastatic to liver ([place])   . Chronic back pain   . Constipation   . Depression   . Family history of adverse reaction to anesthesia     mother has Copd was kept on vent   . Headache   . Heart murmur   . History of hyperthyroidism    s/p  RAI  . History of radiation therapy 02/11/16- 03/27/16   Right Chest Wall 50.4 Gy in 28 fractions, Right Chest Wall Boost 10 Gy in 5 fractions.   . Hyperlipidemia   . Hypertension   .  Hypothyroidism, postradioiodine therapy    endocrinoloigst-  dr balan--  2004  s/p  RAI  . Menorrhagia 2011  . Multiple pulmonary nodules    secondary  metastatic  . PONV (postoperative nausea and vomiting)    patch helps   . SUI (stress urinary incontinence, female)   . Wears contact lenses   . Wears dentures    full upper and lower partial    PAST SURGICAL HISTORY: Past Surgical History:  Procedure Laterality Date  . BREAST LUMPECTOMY WITH NEEDLE  LOCALIZATION AND AXILLARY SENTINEL LYMPH NODE BX Right 11/28/2015   Procedure: RIGHT BREAST LUMPECTOMY WITH NEEDLE LOCALIZATION AND AXILLARY SENTINEL LYMPH NODE BX;  Surgeon: Autumn Messing III, MD;  Location: [place];  Service: General;  Laterality: Right;  . BREAST REDUCTION SURGERY Bilateral 11/28/2015   Procedure: MAMMARY REDUCTION  (BREAST) BILATERAL;  Surgeon: Crissie Reese, MD;  Location: [place];  Service: Plastics;  Laterality: Bilateral;  . CESAREAN SECTION  01/12/1993  . COLONOSCOPY    . Dental surgeries    . FEMUR IM NAIL Left 01/28/2018   Procedure: INTRAMEDULLARY (IM) NAIL FEMORAL;  Surgeon: Nicholes Stairs, MD;  Location: [place];  Service: Orthopedics;  Laterality: Left;  . HYSTEROSCOPY N/A 09/02/2017   Procedure: HYSTEROSCOPY  to remove IUD;  Surgeon: Eldred Manges, MD;  Location[facility];  Service: Gynecology;  Laterality: N/A;  . Liver biospy    . MASTECTOMY    . MASTECTOMY W/ SENTINEL NODE BIOPSY Bilateral 12/26/2015   Procedure: BILATERAL MASTECTOMY WITH LEFT SENTINEL LYMPH NODE BIOPSY;  Surgeon: Autumn Messing III, MD;  Location: [place];  Service: General;  Laterality: Bilateral;  . Removal of IUD    . TUBAL LIGATION Bilateral 1995    FAMILY HISTORY Family History  Problem Relation Age of Onset  . Diabetes Mother   . Hypertension Mother   . Hypertension Father   . Diabetes Maternal Grandmother   . Hypertension Sister   . Cancer Paternal Grandmother        colon  . Colon cancer Paternal Grandmother   . Esophageal cancer Neg Hx   . Stomach cancer Neg Hx   . Rectal cancer Neg Hx   The patient's parents are still living, in their early 91s as of June 2017. The patient has one brother, 2 sisters. On the maternal side there is a history of uterine and prostate cancer. On the paternal side there is a history of colon cancer and possibly uterine cancer. There is no history of breast or ovarian cancer in the family.   GYNECOLOGIC HISTORY:  No LMP recorded.  (Menstrual status: IUD).  Menarche age 9. She still having regular periods. She is GX P1, first pregnancy age 66. She used oral contraceptives for a few years remotely, with no complications. She is currently on a Mirena IUD and also is status post bilateral tubal ligation.  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 Her husband [name] is a Administrator. Their son [name] is a radio DJ. The patient has no grandchildren. She is not a Ambulance person.   ADVANCED DIRECTIVES: Not in place   HEALTH MAINTENANCE: Social History   Tobacco Use  . Smoking status: Never Smoker  . Smokeless tobacco: Never Used  Substance Use Topics  . Alcohol use: No  . Drug use: No     Colonoscopy: January 2016   PAP: January 2016   Bone density: August 2017 showed a T score  of +0.7 normal  Lipid panel:  Allergies  Allergen Reactions  . Ace Inhibitors Cough  . Atorvastatin Other (See Comments)    Joint pain  . Simvastatin Other (See Comments)    Joint pain    Current Outpatient Medications  Medication Sig Dispense Refill  . acetaminophen (TYLENOL) 325 MG tablet Take 325 mg by mouth every 6 (six) hours as needed for mild pain or headache.     . anastrozole (ARIMIDEX) 1 MG tablet Take 1 tablet (1 mg total) by mouth daily. 90 tablet 4  . Calcium-Magnesium 250-125 MG TABS Take 1 tablet by mouth daily. 120 each 4  . cetirizine (ZYRTEC ALLERGY) 10 MG tablet Take 10 mg by mouth daily as needed for allergies.     Marland Kitchen dexamethasone (DECADRON) 0.5 MG/5ML solution Swish 44m in mouth for 283m and spit out. Use 4 times daily for 8 weeks, start with Afinitor. Avoid eating/drinking for 1hr after rinse. (Patient taking differently: Take 1 mg by mouth 4 (four) times daily. Swish 1032mn mouth for 2mi104mnd spit out. Use 4 times daily for 8 weeks, start with Afinitor. Avoid eating/drinking for 1hr after rinse.) 500 mL 4  . docusate sodium (COLACE) 100 MG capsule Take 200-400 mg by mouth daily.     . evMarland Kitchenrolimus (AFINITOR) 10 MG tablet Take 1 tablet (10 mg total) by mouth daily. Take at approximately the same time each day with a glass of water. 28 tablet 6  . hydrochlorothiazide (MICROZIDE) 12.5 MG capsule Take 12.5 mg by mouth every morning.     . hydrocortisone cream 1 % Apply 1 application topically daily as needed (for rash).    . ibMarland Kitchenprofen (ADVIL,MOTRIN) 600 MG tablet Take 1 tablet (600 mg total) by mouth every 6 (six) hours as needed for moderate pain. 60 tablet 0  . levothyroxine (SYNTHROID, LEVOTHROID) 137 MCG tablet Take 137 mcg by mouth daily before breakfast.     . LIVALO 2 MG TABS Take 2 mg by mouth daily.   6  . losartan (COZAAR) 100 MG tablet Take 100 mg by mouth every morning.     . Multiple Vitamin (MULTIVITAMIN) tablet Take 1 tablet by mouth daily.    . omMarland Kitchenga-3 acid ethyl esters (LOVAZA) 1 g capsule Take 1 g by mouth 2 (two) times daily.     . oxMarland KitchenCODONE (ROXICODONE) 5 MG immediate release tablet Take 1 tablet (5 mg total) by mouth every 6 (six) hours as needed for severe pain. 15 tablet 0  . potassium chloride SA (K-DUR,KLOR-CON) 20 MEQ tablet Take 40 mEq by mouth daily.     . prochlorperazine (COMPAZINE) 10 MG tablet Take 1 tablet (10 mg total) by mouth every 6 (six) hours as needed for nausea or vomiting. 30 tablet 0  . Vitamin D, Cholecalciferol, 1000 units CAPS Take 1 tablet by mouth daily. (Patient taking differently: Take 1,000 Units by mouth daily. ) 90 capsule 4   No current facility-administered medications for this visit.     OBJECTIVE:  Vitals:   03/01/18 0950  BP: (!) 144/74  Pulse: 86  Resp: 18  Temp: 98.6 F (37 C)  SpO2: 91%     Body mass index is 41.56 kg/m.    ECOG FS:2 - Symptomatic, <50% confined to bed  There were no vitals filed for this visit. GENERAL: Patient is a well appearing female in no acute distress HEENT:  Sclerae anicteric.  PERRL.  Oropharynx clear and moist. No ulcerations or evidence of oropharyngeal candidiasis. Neck  is  supple.  NODES:  No cervical, supraclavicular, or axillary lymphadenopathy palpated.  BREAST EXAM: examined at last visit, declines today LUNGS:  Clear to auscultation bilaterally.  No wheezes or rhonchi. HEART:  Regular rate and rhythm. No murmur appreciated. ABDOMEN:  Soft, nontender.  Positive, normoactive bowel sounds. No organomegaly palpated. MSK:  No focal spinal tenderness to palpation. Full range of motion bilaterally in the upper extremities. EXTREMITIES:  No peripheral edema.   SKIN:  Clear with no obvious rashes or skin changes. No nail dyscrasia. NEURO:  Nonfocal. Well oriented.  Appropriate affect.   LAB RESULTS:  CMP     Component Value Date/Time   NA 142 03/01/2018 0822   NA 140 07/08/2016 0948   K 3.6 03/01/2018 0822   K 4.1 07/08/2016 0948   CL 107 03/01/2018 0822   CO2 22 03/01/2018 0822   CO2 24 07/08/2016 0948   GLUCOSE 110 (H) 03/01/2018 0822   GLUCOSE 89 07/08/2016 0948   BUN 8 03/01/2018 0822   BUN 11.9 07/08/2016 0948   CREATININE 0.74 03/01/2018 0822   CREATININE 0.90 06/22/2017 1147   CREATININE 0.9 07/08/2016 0948   CALCIUM 9.2 03/01/2018 0822   CALCIUM 10.0 07/08/2016 0948   PROT 7.2 03/01/2018 0822   PROT 7.6 07/08/2016 0948   ALBUMIN 3.3 (L) 03/01/2018 0822   ALBUMIN 3.6 07/08/2016 0948   AST 23 03/01/2018 0822   AST 31 06/22/2017 1147   AST 22 07/08/2016 0948   ALT 30 03/01/2018 0822   ALT 45 06/22/2017 1147   ALT 27 07/08/2016 0948   ALKPHOS 149 (H) 03/01/2018 0822   ALKPHOS 115 07/08/2016 0948   BILITOT 0.3 03/01/2018 0822   BILITOT 0.4 06/22/2017 1147   BILITOT 0.35 07/08/2016 0948   GFRNONAA >60 03/01/2018 0822   GFRNONAA >60 06/22/2017 1147   GFRAA >60 03/01/2018 0822   GFRAA >60 06/22/2017 1147    INo results found for: SPEP, UPEP  Lab Results  Component Value Date   WBC 2.8 (L) 03/01/2018   NEUTROABS 2.1 03/01/2018   HGB 12.0 03/01/2018   HCT 36.5 03/01/2018   MCV 96.8 03/01/2018   PLT 223 03/01/2018       Chemistry      Component Value Date/Time   NA 142 03/01/2018 0822   NA 140 07/08/2016 0948   K 3.6 03/01/2018 0822   K 4.1 07/08/2016 0948   CL 107 03/01/2018 0822   CO2 22 03/01/2018 0822   CO2 24 07/08/2016 0948   BUN 8 03/01/2018 0822   BUN 11.9 07/08/2016 0948   CREATININE 0.74 03/01/2018 0822   CREATININE 0.90 06/22/2017 1147   CREATININE 0.9 07/08/2016 0948      Component Value Date/Time   CALCIUM 9.2 03/01/2018 0822   CALCIUM 10.0 07/08/2016 0948   ALKPHOS 149 (H) 03/01/2018 0822   ALKPHOS 115 07/08/2016 0948   AST 23 03/01/2018 0822   AST 31 06/22/2017 1147   AST 22 07/08/2016 0948   ALT 30 03/01/2018 0822   ALT 45 06/22/2017 1147   ALT 27 07/08/2016 0948   BILITOT 0.3 03/01/2018 0822   BILITOT 0.4 06/22/2017 1147   BILITOT 0.35 07/08/2016 0948       No results found for: LABCA2  No components found for: [id number]  No results for input(s): INR in the last 168 hours.  Urinalysis No results found for: COLORURINE, APPEARANCEUR, LABSPEC, PHURINE, GLUCOSEU, HGBUR, BILIRUBINUR, KETONESUR, PROTEINUR, UROBILINOGEN, NITRITE, LEUKOCYTESUR   STUDIES: No results found.  ELIGIBLE  FOR AVAILABLE RESEARCH PROTOCOL: PALLAS, but inelegible because of Mirena IUD  ASSESSMENT: 52 y.o. [place] woman status post right breast upper inner quadrant biopsy 10/15/2015 for a clinical T2-T3 N0, stage II invasive ductal carcinoma, estrogen and progesterone receptor positive, HER-2 nonamplified, with an Mib-1 of 70%  (1) status post right lumpectomy and sentinel lymph node sampling with oncoplastic breast reduction 11/28/2015 for a pT2 pN1, stage IIB invasive ductal carcinoma, grade 1, with negative margins (1/1 sentinel node positive)  (a) reclassified as stage IIA in the 2018 prognostic revision   (2) left reduction mammoplasty 11/28/2015 unexpectedly found a pT1c pNX invasive ductal carcinoma, grade 1, estrogen and progesterone receptor positive, HER-2 not amplified, with an MIB-1  of 3%  (3) Mammaprint from the right sided breast cancer was "low risk", predicting a 98% chance of no disease recurrence within 5 years with anti-estrogens as the only systemic therapy. It also predicts minimal to no benefit from chemotherapy  (4) status post bilateral mastectomies with bilateral sentinel lymph node sampling 12/26/2015, showing  (a) on the right, no residual carcinoma (0/3 nodes involved)  (b) on the left, focal ductal carcinoma in situ, 0.2 cm, with negative margins; (0/4 nodes involved)  (5) adjuvant radiation 02/11/16 - 03/27/16    1) Right chest wall: 50.4 Gy in 28 fractions               2) Right chest wall boost: 10 Gy in 5 fractions   (6) started on tamoxifen neoadjuvantly 10/23/2015 to allow for expected delays in definitive local treatment  (a) Mirena IUD in place  (b) tamoxifen discontinued 08/13/2017, with progression  (7) since she had 4 lymph nodes removed from each axilla but also receive radiation on the right, lab draws should preferentially be obtained from the left arm   METASTATIC DISEASE: April 2019 (8) CT scan of the abdomen and pelvis 08/04/2017, MRI of the liver and total spine MRI 08/11/2017 and CT scan of the chest and brain on 08/13/2017 showed liver and bone metastases  (a) liver biopsy 09/07/2017 confirms estrogen receptor positive, progesterone receptor negative, HER-2 negative metastatic breast cancer  (b) chest CT scan 08/13/2017 shows multiple bilateral pulmonary nodules.  (c) CT of the brain with and without contrast 08/26/2017 shows no evidence of metastatic disease to the brain  (d) CA 27-29 is informative  (9) fulvestrant started 08/17/2017  (a) palbociclib started 08/31/2017  (b) fulvestrant and palbociclib discontinued 01/04/2018 with evidence of progression in the liver  (10) denosumab/Xgeva started 09/14/2017  (11) anastrozole started 01/04/2018  (a) everolimus to started in early 01/2018  (12) 01/28/18 prophylactic left femur  intramedullary nail surgery for impending left femur pathologic fracture  (a) palliative radiation with Dr. [name] to the left femur and left ilium from 10/9 to 10/22  PLAN: Manika is doing well today.  She is tolerating the Anastrozole and Everolimus very well.  She will continue this.  She is also tolerating the Xgeva and will receive this monthly.  Her labs are stable today and I reviewed those with her in detail.    Tiaja's left leg pain is much improved with the radiation.  I congratulated her on her strength improvement in her left leg.    Aiesha will return in 4 weeks for labs, f/u, and her next injection.  I went ahead and ordered scans to be done in December, just prior to her f/u with Dr. [name] on 12/19.     A total of (30) minutes of face-to-face time  was spent with this patient with greater than 50% of that time in counseling and care-coordination.    Wilber Bihari, NP  03/01/18 10:08 AM Medical Oncology and Hematology [facility] [address] [place], [place] [address] Tel. [phone]    Fax. [phone]

## 2018-03-01 NOTE — Patient Instructions (Signed)

## 2018-03-01 NOTE — Telephone Encounter (Signed)
Gave avs and calendar ° °

## 2018-03-02 LAB — CANCER ANTIGEN 27.29: CA 27.29: 115.1 U/mL — ABNORMAL HIGH (ref 0.0–38.6)

## 2018-03-03 DIAGNOSIS — M25552 Pain in left hip: Secondary | ICD-10-CM | POA: Diagnosis not present

## 2018-03-04 NOTE — Progress Notes (Signed)
  Radiation Oncology         [phone] ________________________________  Name: Debbie [name] MRN: [id number]  Date: 03/01/2018  DOB: [birth year]  End of Treatment Note  Diagnosis:   Metastatic breast cancer to the left femur  Indication for treatment:  Palliative       Radiation treatment dates:   02/16/2018 to 03/01/2018  Site/dose:   The Left femur was treated to 30 Gy in 10 fractions of 3 Gy.  Beams/energy:   Complex isodose // 15X 6X  Narrative: The patient tolerated radiation treatment relatively well.   No skin breakdown within the treatment field. Patient reports minor pain to left hip area, further stating "the aching is better".  Plan: The patient has completed radiation treatment. The patient will address lymphedema issues in physical therapy. The patient will return to radiation oncology clinic for routine followup in one month. I advised them to call or return sooner if they have any questions or concerns related to their recovery or treatment.  -----------------------------------  [name], MD   This document serves as a record of services personally performed by [name], MD. It was created on her behalf by Arlyce Harman, a trained medical scribe. The creation of this record is based on the scribe's personal observations and the provider's statements to them. This document has been checked and approved by the attending provider.

## 2018-03-08 MED FILL — LIVALO 2 MG TABLET: 2 | 90 days supply | Qty: 90 | Fill #0

## 2018-03-08 MED FILL — DEXAMETHASONE 0.5 MG/5 ML L: 0.5 | 12 days supply | Qty: 500 | Fill #2

## 2018-03-08 MED FILL — AFINITOR 10 MG TABLET: 10 | 28 days supply | Qty: 28 | Fill #2

## 2018-03-08 MED FILL — POTASSIUM CL ER 20 MEQ TABL: 20 | 90 days supply | Qty: 360 | Fill #0

## 2018-03-15 DIAGNOSIS — Z4789 Encounter for other orthopedic aftercare: Secondary | ICD-10-CM | POA: Diagnosis not present

## 2018-03-30 NOTE — Progress Notes (Signed)
Debbie [name] presents for follow up of radiation completed 03/01/18 to her left femur. She saw [name] NP on 03/31/18. She will see Dr. [name] 04/28/18. She reports skin issues after completing radiation including redness and moisture to her abdomen and left groin area. She is using a&d ointment and neosporin to these areas. She reports swelling to her left leg, but tells me that it is improving slowly.   BP 133/80 (BP Location: Left Arm, Patient Position: Sitting)   Pulse 100   Temp 98.7 F (37.1 C) (Oral)   Resp 20   Ht 5' 6.5" (1.689 m)   Wt 253 lb 3.2 oz (114.9 kg)   SpO2 98%   BMI 40.26 kg/m    Wt Readings from Last 3 Encounters:  04/01/18 253 lb 3.2 oz (114.9 kg)  03/31/18 253 lb 8 oz (115 kg)  02/08/18 261 lb 6.4 oz (118.6 kg)

## 2018-03-31 ENCOUNTER — Inpatient Hospital Stay: Payer: 59 | Attending: Oncology

## 2018-03-31 ENCOUNTER — Encounter: Payer: Self-pay | Admitting: Adult Health

## 2018-03-31 ENCOUNTER — Inpatient Hospital Stay: Payer: 59

## 2018-03-31 ENCOUNTER — Inpatient Hospital Stay (HOSPITAL_BASED_OUTPATIENT_CLINIC_OR_DEPARTMENT_OTHER): Payer: 59 | Admitting: Adult Health

## 2018-03-31 ENCOUNTER — Telehealth: Payer: Self-pay | Admitting: Adult Health

## 2018-03-31 VITALS — BP 163/63 | HR 93 | Temp 98.5°F | Resp 18 | Ht 66.5 in | Wt 253.5 lb

## 2018-03-31 DIAGNOSIS — C787 Secondary malignant neoplasm of liver and intrahepatic bile duct: Secondary | ICD-10-CM

## 2018-03-31 DIAGNOSIS — Z9013 Acquired absence of bilateral breasts and nipples: Secondary | ICD-10-CM | POA: Insufficient documentation

## 2018-03-31 DIAGNOSIS — Z923 Personal history of irradiation: Secondary | ICD-10-CM

## 2018-03-31 DIAGNOSIS — Z170 Estrogen receptor positive status [ER+]: Secondary | ICD-10-CM | POA: Diagnosis not present

## 2018-03-31 DIAGNOSIS — Z800 Family history of malignant neoplasm of digestive organs: Secondary | ICD-10-CM | POA: Insufficient documentation

## 2018-03-31 DIAGNOSIS — C50112 Malignant neoplasm of central portion of left female breast: Secondary | ICD-10-CM | POA: Diagnosis not present

## 2018-03-31 DIAGNOSIS — Z79810 Long term (current) use of selective estrogen receptor modulators (SERMs): Secondary | ICD-10-CM | POA: Diagnosis not present

## 2018-03-31 DIAGNOSIS — C7951 Secondary malignant neoplasm of bone: Secondary | ICD-10-CM

## 2018-03-31 DIAGNOSIS — Z9221 Personal history of antineoplastic chemotherapy: Secondary | ICD-10-CM

## 2018-03-31 DIAGNOSIS — C50012 Malignant neoplasm of nipple and areola, left female breast: Secondary | ICD-10-CM

## 2018-03-31 DIAGNOSIS — Z79899 Other long term (current) drug therapy: Secondary | ICD-10-CM

## 2018-03-31 DIAGNOSIS — C50211 Malignant neoplasm of upper-inner quadrant of right female breast: Secondary | ICD-10-CM | POA: Insufficient documentation

## 2018-03-31 DIAGNOSIS — C50011 Malignant neoplasm of nipple and areola, right female breast: Secondary | ICD-10-CM

## 2018-03-31 LAB — COMPREHENSIVE METABOLIC PANEL
ALT: 29 U/L (ref 0–44)
AST: 28 U/L (ref 15–41)
Albumin: 3.3 g/dL — ABNORMAL LOW (ref 3.5–5.0)
Alkaline Phosphatase: 164 U/L — ABNORMAL HIGH (ref 38–126)
Anion gap: 12 (ref 5–15)
BUN: 9 mg/dL (ref 6–20)
CO2: 24 mmol/L (ref 22–32)
Calcium: 9.8 mg/dL (ref 8.9–10.3)
Chloride: 106 mmol/L (ref 98–111)
Creatinine, Ser: 0.81 mg/dL (ref 0.44–1.00)
GFR calc Af Amer: >60 mL/min (ref >60)
GFR calc non Af Amer: >60 mL/min (ref >60)
Glucose, Bld: 92 mg/dL (ref 70–99)
Potassium: 3.7 mmol/L (ref 3.5–5.1)
Sodium: 142 mmol/L (ref 135–145)
Total Bilirubin: 0.4 mg/dL (ref 0.3–1.2)
Total Protein: 7.6 g/dL (ref 6.5–8.1)

## 2018-03-31 LAB — CBC WITH DIFFERENTIAL/PLATELET
Abs Immature Granulocytes: 0.03 10*3/uL (ref 0.00–0.07)
Basophils Absolute: 0 10*3/uL (ref 0.0–0.1)
Basophils Relative: 1 %
Eosinophils Absolute: 0.1 10*3/uL (ref 0.0–0.5)
Eosinophils Relative: 3 %
HCT: 37.8 % (ref 36.0–46.0)
Hemoglobin: 12.4 g/dL (ref 12.0–15.0)
Immature Granulocytes: 1 %
Lymphocytes Relative: 7 %
Lymphs Abs: 0.3 10*3/uL — ABNORMAL LOW (ref 0.7–4.0)
MCH: 30.4 pg (ref 26.0–34.0)
MCHC: 32.8 g/dL (ref 30.0–36.0)
MCV: 92.6 fL (ref 80.0–100.0)
Monocytes Absolute: 0.4 10*3/uL (ref 0.1–1.0)
Monocytes Relative: 11 %
Neutro Abs: 2.8 10*3/uL (ref 1.7–7.7)
Neutrophils Relative %: 77 %
Platelets: 229 10*3/uL (ref 150–400)
RBC: 4.08 MIL/uL (ref 3.87–5.11)
RDW: 13.6 % (ref 11.5–15.5)
WBC: 3.6 10*3/uL — ABNORMAL LOW (ref 4.0–10.5)
nRBC: 0 % (ref 0.0–0.2)

## 2018-03-31 MED FILL — LOSARTAN POTASSIUM 100 MG T: 100 | 30 days supply | Qty: 30 | Fill #1

## 2018-03-31 MED FILL — ANASTROZOLE 1 MG TABLET: 1 | 90 days supply | Qty: 90 | Fill #1

## 2018-03-31 NOTE — Progress Notes (Signed)
[place]  [phone] [phone]     ID: [name] DOB: [birth year]  MR#: [id number]  VOJ#:[id number]  Patient Care Team: Kelton Pillar, MD as PCP - General (Family Medicine) Jacelyn Pi, MD as Consulting Physician (Endocrinology) Leo Grosser Seymour Bars, MD as Consulting Physician (Obstetrics and Gynecology) Jovita Kussmaul, MD as Consulting Physician (General Surgery) [name], Virgie Dad, MD as Consulting Physician (Oncology) [name], MD as Attending Physician (Radiation Oncology) Belva Crome, MD as Consulting Physician (Cardiology) Crissie Reese, MD as Consulting Physician (Plastic Surgery) [name], [name], NP as Nurse Practitioner (Hematology and Oncology) PCP: Kelton Pillar, MD OTHER MD:  CHIEF COMPLAINT: Estrogen receptor positive stage IV  breast cancer  CURRENT TREATMENT:  Anastrozole, affinitor; denosumab/Xgeva  INTERVAL HISTORY: Debbie [name] returns today for follow-up of her estrogen receptor positive stage IV breast cancer. She is taking Anastrozole and tolerating it well.      She is taking Everolimus and is tolerating it well.    She receives denosumab/Xgeva every 4 weeks with her most recent dose 4 weeks ago.  She notes an area in her left posterior jaw that her dentures are rubbing and the   REVIEW OF SYSTEMS: [name] is doing moderately well. She has noted that with her left hip healing and the skin breakdown in her groin from radiation that is healing, that she has noted a decreased appetite.  She denies any other issues however such as pain, or discomfort.  She is without unusual headaches or vision changes.  She denies bowel/bladder changes, abdominal pain, nausea or vomiting.  She is without chest pain, palpitations, cough, shortness of breath, fevers, or chills.  A detailed ROS was non contributory.    PAST MEDICAL HISTORY: Past Medical History:  Diagnosis Date  . Anxiety   . Arthritis   . Bone  metastasis ([place])   . Breast cancer of upper-inner quadrant of right female breast [facility]) oncologist-  dr [name]--- 04/ 2019 ER/PR+ Stage IV w/ metastatic disease to liver, total spine, lung, bone   dx 06/ 2017  right breast invasive ductal carcinoma, Stage IIB, ER/PR + (pT2 pN1), grade 1-- s/p  right breast lumpecotmy w/ sln bx's and bilateral breast reduction 11-28-2015/  left breast with reduction , dx invasive ductal ca, Grade 1 (pT1cpNX) ER/PR positive/  adjuvant radiation completed 03-27-2016 to right chest wall, 2018  reclassifiec Stage IIA  . Cancer, metastatic to liver ([place])   . Chronic back pain   . Constipation   . Depression   . Family history of adverse reaction to anesthesia     mother has Copd was kept on vent   . Headache   . Heart murmur   . History of hyperthyroidism    s/p  RAI  . History of radiation therapy 02/11/16- 03/27/16   Right Chest Wall 50.4 Gy in 28 fractions, Right Chest Wall Boost 10 Gy in 5 fractions.   . Hyperlipidemia   . Hypertension   . Hypothyroidism, postradioiodine therapy    endocrinoloigst-  dr balan--  2004  s/p  RAI  . Menorrhagia 2011  . Multiple pulmonary nodules    secondary  metastatic  . PONV (postoperative nausea and vomiting)    patch helps   . SUI (stress urinary incontinence, female)   . Wears contact lenses   . Wears dentures    full upper and lower partial    PAST SURGICAL HISTORY: Past Surgical History:  Procedure Laterality Date  . BREAST LUMPECTOMY WITH NEEDLE  LOCALIZATION AND AXILLARY SENTINEL LYMPH NODE BX Right 11/28/2015   Procedure: RIGHT BREAST LUMPECTOMY WITH NEEDLE LOCALIZATION AND AXILLARY SENTINEL LYMPH NODE BX;  Surgeon: Autumn Messing III, MD;  Location: [place];  Service: General;  Laterality: Right;  . BREAST REDUCTION SURGERY Bilateral 11/28/2015   Procedure: MAMMARY REDUCTION  (BREAST) BILATERAL;  Surgeon: Crissie Reese, MD;  Location: [place];  Service: Plastics;  Laterality: Bilateral;  . CESAREAN SECTION  01/12/1993    . COLONOSCOPY    . Dental surgeries    . FEMUR IM NAIL Left 01/28/2018   Procedure: INTRAMEDULLARY (IM) NAIL FEMORAL;  Surgeon: Nicholes Stairs, MD;  Location: [place];  Service: Orthopedics;  Laterality: Left;  . HYSTEROSCOPY N/A 09/02/2017   Procedure: HYSTEROSCOPY  to remove IUD;  Surgeon: Eldred Manges, MD;  Location: [facility];  Service: Gynecology;  Laterality: N/A;  . Liver biospy    . MASTECTOMY    . MASTECTOMY W/ SENTINEL NODE BIOPSY Bilateral 12/26/2015   Procedure: BILATERAL MASTECTOMY WITH LEFT SENTINEL LYMPH NODE BIOPSY;  Surgeon: Autumn Messing III, MD;  Location: [place];  Service: General;  Laterality: Bilateral;  . Removal of IUD    . TUBAL LIGATION Bilateral 1995    FAMILY HISTORY Family History  Problem Relation Age of Onset  . Diabetes Mother   . Hypertension Mother   . Hypertension Father   . Diabetes Maternal Grandmother   . Hypertension Sister   . Cancer Paternal Grandmother        colon  . Colon cancer Paternal Grandmother   . Esophageal cancer Neg Hx   . Stomach cancer Neg Hx   . Rectal cancer Neg Hx   The patient's parents are still living, in their early 47s as of June 2017. The patient has one brother, 2 sisters. On the maternal side there is a history of uterine and prostate cancer. On the paternal side there is a history of colon cancer and possibly uterine cancer. There is no history of breast or ovarian cancer in the family.   GYNECOLOGIC HISTORY:  No LMP recorded. (Menstrual status: IUD).  Menarche age 34. She still having regular periods. She is GX P1, first pregnancy age 88. She used oral contraceptives for a few years remotely, with no complications. She is currently on a Mirena IUD and also is status post bilateral tubal ligation.  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 Her husband [name] is a Administrator. Their son [name] is a radio DJ. The patient has no grandchildren. She is not a Scientist, research (life sciences).   ADVANCED DIRECTIVES: Not in place   HEALTH MAINTENANCE: Social History   Tobacco Use  . Smoking status: Never Smoker  . Smokeless tobacco: Never Used  Substance Use Topics  . Alcohol use: No  . Drug use: No     Colonoscopy: January 2016   PAP: January 2016   Bone density: August 2017 showed a T score of +0.7 normal  Lipid panel:  Allergies  Allergen Reactions  . Ace Inhibitors Cough  . Atorvastatin Other (See Comments)    Joint pain  . Simvastatin Other (See Comments)    Joint pain    Current Outpatient Medications  Medication Sig Dispense Refill  . acetaminophen (TYLENOL) 325 MG tablet Take 325 mg by mouth every 6 (six) hours as needed for mild pain or headache.     . anastrozole (ARIMIDEX) 1 MG tablet Take 1 tablet (1 mg total)  by mouth daily. 90 tablet 4  . Calcium-Magnesium 250-125 MG TABS Take 1 tablet by mouth daily. 120 each 4  . cetirizine (ZYRTEC ALLERGY) 10 MG tablet Take 10 mg by mouth daily as needed for allergies.     Marland Kitchen dexamethasone (DECADRON) 0.5 MG/5ML solution Swish 62m in mouth for 282m and spit out. Use 4 times daily for 8 weeks, start with Afinitor. Avoid eating/drinking for 1hr after rinse. (Patient taking differently: Take 1 mg by mouth 4 (four) times daily. Swish 1038mn mouth for 2mi12mnd spit out. Use 4 times daily for 8 weeks, start with Afinitor. Avoid eating/drinking for 1hr after rinse.) 500 mL 4  . docusate sodium (COLACE) 100 MG capsule Take 200-400 mg by mouth daily.    . evMarland Kitchenrolimus (AFINITOR) 10 MG tablet Take 1 tablet (10 mg total) by mouth daily. Take at approximately the same time each day with a glass of water. 28 tablet 6  . hydrochlorothiazide (MICROZIDE) 12.5 MG capsule Take 12.5 mg by mouth every morning.     . hydrocortisone cream 1 % Apply 1 application topically daily as needed (for rash).    . ibMarland Kitchenprofen (ADVIL,MOTRIN) 600 MG tablet Take 1 tablet (600 mg total) by mouth every 6 (six) hours as needed for moderate pain.  60 tablet 0  . levothyroxine (SYNTHROID, LEVOTHROID) 137 MCG tablet Take 137 mcg by mouth daily before breakfast.     . LIVALO 2 MG TABS Take 2 mg by mouth daily.   6  . losartan (COZAAR) 100 MG tablet Take 100 mg by mouth every morning.     . Multiple Vitamin (MULTIVITAMIN) tablet Take 1 tablet by mouth daily.    . omMarland Kitchenga-3 acid ethyl esters (LOVAZA) 1 g capsule Take 1 g by mouth 2 (two) times daily.     . oxMarland KitchenCODONE (ROXICODONE) 5 MG immediate release tablet Take 1 tablet (5 mg total) by mouth every 6 (six) hours as needed for severe pain. 15 tablet 0  . potassium chloride SA (K-DUR,KLOR-CON) 20 MEQ tablet Take 40 mEq by mouth daily.     . prochlorperazine (COMPAZINE) 10 MG tablet Take 1 tablet (10 mg total) by mouth every 6 (six) hours as needed for nausea or vomiting. 30 tablet 0  . Vitamin D, Cholecalciferol, 1000 units CAPS Take 1 tablet by mouth daily. (Patient taking differently: Take 1,000 Units by mouth daily. ) 90 capsule 4   No current facility-administered medications for this visit.     OBJECTIVE:  Vitals:   03/31/18 1338  BP: (!) 163/63  Pulse: 93  Resp: 18  Temp: 98.5 F (36.9 C)     Body mass index is 40.3 kg/m.    ECOG FS:2 - Symptomatic, <50% confined to bed  Filed Weights   03/31/18 1338  Weight: 253 lb 8 oz (115 kg)   GENERAL: Patient is a well appearing female in no acute distress HEENT:  Sclerae anicteric.  PERRL.  Oropharynx clear and moist. No ulcerations or evidence of oropharyngeal candidiasis. Neck is supple.  NODES:  No cervical, supraclavicular, or axillary lymphadenopathy palpated.  BREAST EXAM: s/p bilateral mastectomies, no sign of local recurrence LUNGS:  Clear to auscultation bilaterally.  No wheezes or rhonchi. HEART:  Regular rate and rhythm. No murmur appreciated. ABDOMEN:  Soft, nontender.  Positive, normoactive bowel sounds. No organomegaly palpated. MSK:  No focal spinal tenderness to palpation. Full range of motion bilaterally in the upper  extremities. EXTREMITIES:  No peripheral edema.   SKIN:  Clear  with no obvious rashes or skin changes. No nail dyscrasia. NEURO:  Nonfocal. Well oriented.  Appropriate affect.   LAB RESULTS:  CMP     Component Value Date/Time   NA 142 03/31/2018 1313   NA 140 07/08/2016 0948   K 3.7 03/31/2018 1313   K 4.1 07/08/2016 0948   CL 106 03/31/2018 1313   CO2 24 03/31/2018 1313   CO2 24 07/08/2016 0948   GLUCOSE 92 03/31/2018 1313   GLUCOSE 89 07/08/2016 0948   BUN 9 03/31/2018 1313   BUN 11.9 07/08/2016 0948   CREATININE 0.81 03/31/2018 1313   CREATININE 0.90 06/22/2017 1147   CREATININE 0.9 07/08/2016 0948   CALCIUM 9.8 03/31/2018 1313   CALCIUM 10.0 07/08/2016 0948   PROT 7.6 03/31/2018 1313   PROT 7.6 07/08/2016 0948   ALBUMIN 3.3 (L) 03/31/2018 1313   ALBUMIN 3.6 07/08/2016 0948   AST 28 03/31/2018 1313   AST 31 06/22/2017 1147   AST 22 07/08/2016 0948   ALT 29 03/31/2018 1313   ALT 45 06/22/2017 1147   ALT 27 07/08/2016 0948   ALKPHOS 164 (H) 03/31/2018 1313   ALKPHOS 115 07/08/2016 0948   BILITOT 0.4 03/31/2018 1313   BILITOT 0.4 06/22/2017 1147   BILITOT 0.35 07/08/2016 0948   GFRNONAA >60 03/31/2018 1313   GFRNONAA >60 06/22/2017 1147   GFRAA >60 03/31/2018 1313   GFRAA >60 06/22/2017 1147    INo results found for: SPEP, UPEP  Lab Results  Component Value Date   WBC 3.6 (L) 03/31/2018   NEUTROABS 2.8 03/31/2018   HGB 12.4 03/31/2018   HCT 37.8 03/31/2018   MCV 92.6 03/31/2018   PLT 229 03/31/2018      Chemistry      Component Value Date/Time   NA 142 03/31/2018 1313   NA 140 07/08/2016 0948   K 3.7 03/31/2018 1313   K 4.1 07/08/2016 0948   CL 106 03/31/2018 1313   CO2 24 03/31/2018 1313   CO2 24 07/08/2016 0948   BUN 9 03/31/2018 1313   BUN 11.9 07/08/2016 0948   CREATININE 0.81 03/31/2018 1313   CREATININE 0.90 06/22/2017 1147   CREATININE 0.9 07/08/2016 0948      Component Value Date/Time   CALCIUM 9.8 03/31/2018 1313   CALCIUM 10.0  07/08/2016 0948   ALKPHOS 164 (H) 03/31/2018 1313   ALKPHOS 115 07/08/2016 0948   AST 28 03/31/2018 1313   AST 31 06/22/2017 1147   AST 22 07/08/2016 0948   ALT 29 03/31/2018 1313   ALT 45 06/22/2017 1147   ALT 27 07/08/2016 0948   BILITOT 0.4 03/31/2018 1313   BILITOT 0.4 06/22/2017 1147   BILITOT 0.35 07/08/2016 0948       No results found for: LABCA2  No components found for: LABCA125  No results for input(s): INR in the last 168 hours.  Urinalysis No results found for: COLORURINE, APPEARANCEUR, LABSPEC, PHURINE, GLUCOSEU, HGBUR, BILIRUBINUR, KETONESUR, PROTEINUR, UROBILINOGEN, NITRITE, LEUKOCYTESUR   STUDIES: No results found.  ELIGIBLE FOR AVAILABLE RESEARCH PROTOCOL: PALLAS, but inelegible because of Mirena IUD  ASSESSMENT: 52 y.o.  woman status post right breast upper inner quadrant biopsy 10/15/2015 for a clinical T2-T3 N0, stage II invasive ductal carcinoma, estrogen and progesterone receptor positive, HER-2 nonamplified, with an Mib-1 of 70%  (1) status post right lumpectomy and sentinel lymph node sampling with oncoplastic breast reduction 11/28/2015 for a pT2 pN1, stage IIB invasive ductal carcinoma, grade 1, with negative margins (1/1 sentinel node positive)  (  a) reclassified as stage IIA in the 2018 prognostic revision   (2) left reduction mammoplasty 11/28/2015 unexpectedly found a pT1c pNX invasive ductal carcinoma, grade 1, estrogen and progesterone receptor positive, HER-2 not amplified, with an MIB-1 of 3%  (3) Mammaprint from the right sided breast cancer was "low risk", predicting a 98% chance of no disease recurrence within 5 years with anti-estrogens as the only systemic therapy. It also predicts minimal to no benefit from chemotherapy  (4) status post bilateral mastectomies with bilateral sentinel lymph node sampling 12/26/2015, showing  (a) on the right, no residual carcinoma (0/3 nodes involved)  (b) on the left, focal ductal carcinoma in  situ, 0.2 cm, with negative margins; (0/4 nodes involved)  (5) adjuvant radiation 02/11/16 - 03/27/16    1) Right chest wall: 50.4 Gy in 28 fractions               2) Right chest wall boost: 10 Gy in 5 fractions   (6) started on tamoxifen neoadjuvantly 10/23/2015 to allow for expected delays in definitive local treatment  (a) Mirena IUD in place  (b) tamoxifen discontinued 08/13/2017, with progression  (7) since she had 4 lymph nodes removed from each axilla but also receive radiation on the right, lab draws should preferentially be obtained from the left arm   METASTATIC DISEASE: April 2019 (8) CT scan of the abdomen and pelvis 08/04/2017, MRI of the liver and total spine MRI 08/11/2017 and CT scan of the chest and brain on 08/13/2017 showed liver and bone metastases  (a) liver biopsy 09/07/2017 confirms estrogen receptor positive, progesterone receptor negative, HER-2 negative metastatic breast cancer  (b) chest CT scan 08/13/2017 shows multiple bilateral pulmonary nodules.  (c) CT of the brain with and without contrast 08/26/2017 shows no evidence of metastatic disease to the brain  (d) CA 27-29 is informative  (9) fulvestrant started 08/17/2017  (a) palbociclib started 08/31/2017  (b) fulvestrant and palbociclib discontinued 01/04/2018 with evidence of progression in the liver  (10) denosumab/Xgeva started 09/14/2017  (11) anastrozole started 01/04/2018  (a) everolimus to started in early 01/2018  (12) 01/28/18 prophylactic left femur intramedullary nail surgery for impending left femur pathologic fracture  (a) palliative radiation with Dr. [name] to the left femur and left ilium from 10/9 to 10/22  PLAN: Oletta is doing well today.  She has no clinical sign of progression today.  She is tolerating the Anastrozole and Everolimus well and will continue this. We reviewed that her tumor marker continues to declines, most recetnly it was 115 and two months prior to that it was 391.   This is reassuring.    Lanelle Bal and I reviewed the dental issue.  I talked to Dr. [name] about it and we will hold today's dose due to the area of exposed bone in her mouth.  We will see her back and evaluate in one month.  Karalee will undergo restaging in December with MRI liver.  This has not yet been scheduled and I asked Tammi RN to get it scheduled for her.    Dave will return in 4 weeks for labs, f/u with Dr. [name] to review her scans, and her next injection.  She knows to call for any questions or concerns prior to her next appointment with [place].  A total of (20) minutes of face-to-face time was spent with this patient with greater than 50% of that time in counseling and care-coordination.  Wilber Bihari, NP  03/31/18 2:50 PM Medical Oncology and Hematology  [facility] [place], [place] [address] Tel. [phone]    Fax. [phone]

## 2018-03-31 NOTE — Telephone Encounter (Signed)
No los °

## 2018-03-31 NOTE — Progress Notes (Deleted)
Per [name] Pt. Will not receive injection today.

## 2018-04-01 ENCOUNTER — Encounter: Payer: Self-pay | Admitting: Radiation Oncology

## 2018-04-01 ENCOUNTER — Ambulatory Visit
Admission: RE | Admit: 2018-04-01 | Discharge: 2018-04-01 | Disposition: A | Discharge status: Home / Self Care | Payer: 59 | Source: Ambulatory Visit | Attending: Radiation Oncology | Admitting: Radiation Oncology

## 2018-04-01 ENCOUNTER — Other Ambulatory Visit: Payer: Self-pay

## 2018-04-01 DIAGNOSIS — C7951 Secondary malignant neoplasm of bone: Secondary | ICD-10-CM

## 2018-04-01 DIAGNOSIS — Z79899 Other long term (current) drug therapy: Secondary | ICD-10-CM | POA: Insufficient documentation

## 2018-04-01 LAB — CANCER ANTIGEN 27.29: CA 27.29: 66.8 U/mL — ABNORMAL HIGH (ref 0.0–38.6)

## 2018-04-01 MED FILL — AFINITOR 10 MG TABLET: 10 | 28 days supply | Qty: 28 | Fill #3

## 2018-04-01 NOTE — Progress Notes (Signed)
Radiation Oncology         [phone] ________________________________  Name: Debbie [name] MRN: [id number]  Date: 04/01/2018  DOB: 01/25/1966  Follow-Up Visit Note  Outpatient  CC: Kelton Pillar, MD  [name], Virgie Dad, MD  Diagnosis and Prior Radiotherapy:    ICD-10-CM   1. Bone metastases ([place]) C79.51      02/11/2016 to 03/27/2016:  Right chest wall: 50.4 Gy in 28 fractions Right SCV/PAB nodes: 45 Gy in 25 fractions Right chest wall boost: 10 Gy in 5 fractions  02/16/2018 to 03/01/2018: Left femur was treated to 30 Gy in 10 fractions of 3 Gy.  CHIEF COMPLAINT: Here for follow-up and surveillance of metastatic breast cancer  Narrative:  The patient returns today for routine follow-up. She saw [name] NP on 03/31/2018. She reports skin issues after completing radiation including redness and moisture to her abdomen and left groin area. She is using A&D ointment and neosporin to these areas. These areas have been improving over time. She reports swelling and tenderness with palpation to her left leg since surgery, but tells me that it is improving slowly. This leg swelling is worse at the end of the day. She has been using cold compresses and elevates her leg when she can to resolve swelling. She has compression stockings but prefers to elevate her leg to wearing these. She was seen by physical therapy but has completed this. She denies diarrhea.       The patient is scheduled for Liver MRI on 04/12/2018 and Bone scan on 04/19/2018. She is scheduled to follow-up with Dr. [name] on 04/28/2018.              ALLERGIES:  is allergic to ace inhibitors; atorvastatin; and simvastatin.  Meds: Current Outpatient Medications  Medication Sig Dispense Refill  . acetaminophen (TYLENOL) 325 MG tablet Take 325 mg by mouth every 6 (six) hours as needed for mild pain or headache.     . anastrozole (ARIMIDEX) 1 MG tablet Take 1 tablet (1 mg total) by mouth  daily. 90 tablet 4  . Calcium-Magnesium 250-125 MG TABS Take 1 tablet by mouth daily. 120 each 4  . cetirizine (ZYRTEC ALLERGY) 10 MG tablet Take 10 mg by mouth daily as needed for allergies.     Marland Kitchen everolimus (AFINITOR) 10 MG tablet Take 1 tablet (10 mg total) by mouth daily. Take at approximately the same time each day with a glass of water. 28 tablet 6  . hydrochlorothiazide (MICROZIDE) 12.5 MG capsule Take 12.5 mg by mouth every morning.     . hydrocortisone cream 1 % Apply 1 application topically daily as needed (for rash).    Marland Kitchen ibuprofen (ADVIL,MOTRIN) 600 MG tablet Take 1 tablet (600 mg total) by mouth every 6 (six) hours as needed for moderate pain. 60 tablet 0  . levothyroxine (SYNTHROID, LEVOTHROID) 137 MCG tablet Take 137 mcg by mouth daily before breakfast.     . LIVALO 2 MG TABS Take 2 mg by mouth daily.   6  . losartan (COZAAR) 100 MG tablet Take 100 mg by mouth every morning.     . Multiple Vitamin (MULTIVITAMIN) tablet Take 1 tablet by mouth daily.    Marland Kitchen omega-3 acid ethyl esters (LOVAZA) 1 g capsule Take 1 g by mouth 2 (two) times daily.     . potassium chloride SA (K-DUR,KLOR-CON) 20 MEQ tablet Take 40 mEq by mouth daily.     . Vitamin D, Cholecalciferol, 1000 units CAPS Take 1 tablet  by mouth daily. (Patient taking differently: Take 1,000 Units by mouth daily. ) 90 capsule 4  . dexamethasone (DECADRON) 0.5 MG/5ML solution Swish 49mL in mouth for 89min and spit out. Use 4 times daily for 8 weeks, start with Afinitor. Avoid eating/drinking for 1hr after rinse. (Patient not taking: Reported on 04/01/2018) 500 mL 4  . docusate sodium (COLACE) 100 MG capsule Take 200-400 mg by mouth daily.    Marland Kitchen oxyCODONE (ROXICODONE) 5 MG immediate release tablet Take 1 tablet (5 mg total) by mouth every 6 (six) hours as needed for severe pain. (Patient not taking: Reported on 04/01/2018) 15 tablet 0  . prochlorperazine (COMPAZINE) 10 MG tablet Take 1 tablet (10 mg total) by mouth every 6 (six) hours as  needed for nausea or vomiting. (Patient not taking: Reported on 04/01/2018) 30 tablet 0  . traMADol (ULTRAM) 50 MG tablet Take 50 mg by mouth every 6 (six) hours as needed.     No current facility-administered medications for this encounter.     Physical Findings: The patient is in no acute distress. Patient is alert and oriented.  height is 5' 6.5" (1.689 m) and weight is 253 lb 3.2 oz (114.9 kg). Her oral temperature is 98.7 F (37.1 C). Her blood pressure is 133/80 and her pulse is 100. Her respiration is 20 and oxygen saturation is 98%. .    Left groin shows resolving desquamation with ointment overlying it. Skin is healing with no sign of infection. Small amount of irritation crosses the mid-line. No moist ooze. Continues to have post-operative lymphedema in her left leg and exhibits tenderness with palpation in the skin of the leg but denies deep tenderness in calf to palpation.  Lab Findings: Lab Results  Component Value Date   WBC 3.6 (L) 03/31/2018   HGB 12.4 03/31/2018   HCT 37.8 03/31/2018   MCV 92.6 03/31/2018   PLT 229 03/31/2018    Radiographic Findings: No results found.  Impression/Plan:    Continues to have post-operative lymphedema in her left leg but this is not present in the mornings when she wakes up. She has already completed physical therapy. She has no significant calf tenderness with palpation.   Patient encouraged to continue using neosporin on healing skin area. Patient also given Telfa pads to aid in skin care management.  Patient will follow-up in our clinic prn. She will continue to follow regularly in oncology with Dr. [name].  _____________________________________      This document serves as a record of services personally performed by [name], MD. It was created on her behalf by Arlyce Harman, a trained medical scribe. The creation of this record is based on the scribe's personal observations and the provider's statements to them.  This document has been checked and approved by the attending provider.

## 2018-04-02 ENCOUNTER — Encounter: Payer: Self-pay | Admitting: Radiation Oncology

## 2018-04-12 ENCOUNTER — Ambulatory Visit (HOSPITAL_COMMUNITY)
Admission: RE | Admit: 2018-04-12 | Discharge: 2018-04-12 | Disposition: A | Discharge status: Home / Self Care | Payer: 59 | Source: Ambulatory Visit | Attending: Adult Health | Admitting: Adult Health

## 2018-04-12 DIAGNOSIS — C50812 Malignant neoplasm of overlapping sites of left female breast: Secondary | ICD-10-CM | POA: Diagnosis not present

## 2018-04-12 DIAGNOSIS — C50919 Malignant neoplasm of unspecified site of unspecified female breast: Secondary | ICD-10-CM | POA: Diagnosis not present

## 2018-04-12 DIAGNOSIS — C50811 Malignant neoplasm of overlapping sites of right female breast: Secondary | ICD-10-CM | POA: Insufficient documentation

## 2018-04-12 DIAGNOSIS — K760 Fatty (change of) liver, not elsewhere classified: Secondary | ICD-10-CM | POA: Diagnosis not present

## 2018-04-12 DIAGNOSIS — Z170 Estrogen receptor positive status [ER+]: Secondary | ICD-10-CM | POA: Insufficient documentation

## 2018-04-12 DIAGNOSIS — C7951 Secondary malignant neoplasm of bone: Secondary | ICD-10-CM | POA: Diagnosis not present

## 2018-04-12 DIAGNOSIS — C787 Secondary malignant neoplasm of liver and intrahepatic bile duct: Secondary | ICD-10-CM | POA: Insufficient documentation

## 2018-04-12 MED ORDER — GADOBUTROL 1 MMOL/ML IV SOLN
10.0000 mL | Freq: Once | INTRAVENOUS | Status: AC | PRN
  Administered 2018-04-12: 10 mL via INTRAVENOUS

## 2018-04-19 ENCOUNTER — Encounter (HOSPITAL_COMMUNITY)
Admission: RE | Admit: 2018-04-19 | Discharge: 2018-04-19 | Disposition: A | Discharge status: Home / Self Care | Payer: 59 | Source: Ambulatory Visit | Attending: Adult Health | Admitting: Adult Health

## 2018-04-19 DIAGNOSIS — C50919 Malignant neoplasm of unspecified site of unspecified female breast: Secondary | ICD-10-CM | POA: Diagnosis not present

## 2018-04-19 DIAGNOSIS — C787 Secondary malignant neoplasm of liver and intrahepatic bile duct: Secondary | ICD-10-CM | POA: Diagnosis not present

## 2018-04-19 DIAGNOSIS — Z170 Estrogen receptor positive status [ER+]: Secondary | ICD-10-CM | POA: Insufficient documentation

## 2018-04-19 DIAGNOSIS — C7951 Secondary malignant neoplasm of bone: Secondary | ICD-10-CM | POA: Insufficient documentation

## 2018-04-19 DIAGNOSIS — C50811 Malignant neoplasm of overlapping sites of right female breast: Secondary | ICD-10-CM | POA: Insufficient documentation

## 2018-04-19 DIAGNOSIS — C50812 Malignant neoplasm of overlapping sites of left female breast: Secondary | ICD-10-CM | POA: Diagnosis not present

## 2018-04-19 MED ORDER — TECHNETIUM TC 99M MEDRONATE IV KIT
20.0000 | PACK | Freq: Once | INTRAVENOUS | Status: AC | PRN
  Administered 2018-04-19: 20 via INTRAVENOUS

## 2018-04-27 NOTE — Progress Notes (Signed)
[place]  [phone] [phone]     ID: Debbie [name] DOB: 21-Aug-1965  MR#: [id number]  NPY#:[id number]  Patient Care Team: Kelton Pillar, MD as PCP - General (Family Medicine) Jacelyn Pi, MD as Consulting Physician (Endocrinology) Leo Grosser, Seymour Bars, MD as Consulting Physician (Obstetrics and Gynecology) Jovita Kussmaul, MD as Consulting Physician (General Surgery) Magdalen Cabana, Virgie Dad, MD as Consulting Physician (Oncology) [name], MD as Attending Physician (Radiation Oncology) Belva Crome, MD as Consulting Physician (Cardiology) Crissie Reese, MD as Consulting Physician (Plastic Surgery) [name], [name], NP as Nurse Practitioner (Hematology and Oncology) Margarette Canada (Inactive) PCP: Kelton Pillar, MD OTHER MD:  CHIEF COMPLAINT: Estrogen receptor positive stage IV  breast cancer  CURRENT TREATMENT:  Anastrozole, affinitor; denosumab/Xgeva  INTERVAL HISTORY: Debbie [name] returns today for follow-up of her estrogen receptor positive stage IV breast cancer.   The patient continues on anastrozole, which she is tolerating well.   The patient also continues on affinitor, which she is tolerating well.   Finally, the patient also continues on denosumab/Xgeva, which she is tolerating well.   Since her last visit here, she underwent an abdomen MRI with and without contrast on 04/12/2018 showing numerous small lesions scattered throughout both hepatic lobes consistent with metastatic disease.  1.8 cm segment VII lesion measured previously is 1.7 cm today (13/3).  1.4 cm segment VI lesion measured previously is 1.4 cm today (26/3).  1.1 cm subcapsular segment VIII lesion measured previously is 1.0 cm today (16/3).  Numerous other tiny T2 hyperintensities distributed throughout both hepatic lobes are also similar to prior.  No definite new metastatic lesions evident.No appreciable interval change in size or number of the  hepatic lesions since 08/11/2017. Similar appearance hyperenhancing metastases within the thoracolumbar spine. The tiny pulmonary nodule seen previously in the posterior left base at the costophrenic sulcus are not evident on the current exam. There is hepatic steatosis.  She also underwent a whole body bone scan on 04/19/2018 showing abnormal increased tracer localization identified within the spine at spine, T12, L3, and RIGHT L5, at a lateral LEFT mid rib, a posterolateral mid RIGHT rib, and RIGHT ischium.This pattern of uptake is highly suspicious for osseous metastatic disease. Uptake at the proximal LEFT femur corresponds to placement of an IM nail with proximal compression screw across a known lytic lesion of the proximal LEFT femoral metadiaphysis. Uptake at the distal LEFT femur corresponds to a locking screw at the distal aspect of the IM nail. Previously identified lytic metastatic lesions at the LEFT iliac bone on CT are not demonstrated on the current bone scan.  Results as of 04/28/2018 11:00  Ref. Range 12/07/2017 08:36 01/04/2018 08:10 02/01/2018 08:09 03/01/2018 08:22 03/31/2018 13:13  CA 27.29 Latest Ref Range: 0.0 - 38.6 U/mL 386.4 (H) 391.7 (H) 232.3 (H) 115.1 (H) 66.8 (H)     REVIEW OF SYSTEMS: Debbie [name] is doing well overall. Her legs have been swelling, with itching and burning. This is worse with activity and with leg elevation. She treats with massaging. Unaffected by tramadol. She has tried compression stockings, but they make her feel like her circulation is being cut off. She had a place on her gums, which she attributes to denture rubbing. She has no other sores in or on her mouth. The patient denies unusual headaches, visual changes, nausea, vomiting, or dizziness. There has been no unusual cough, phlegm production, or pleurisy. This been no change in bowel or bladder habits. The patient denies unexplained fatigue  or unexplained weight loss, bleeding, rash, or fever. A detailed  review of systems was otherwise noncontributory.    PAST MEDICAL HISTORY: Past Medical History:  Diagnosis Date  . Anxiety   . Arthritis   . Bone metastasis ([place])   . Breast cancer of upper-inner quadrant of right female breast [facility]) oncologist-  dr [name]--- 04/ 2019 ER/PR+ Stage IV w/ metastatic disease to liver, total spine, lung, bone   dx 06/ 2017  right breast invasive ductal carcinoma, Stage IIB, ER/PR + (pT2 pN1), grade 1-- s/p  right breast lumpecotmy w/ sln bx's and bilateral breast reduction 11-28-2015/  left breast with reduction , dx invasive ductal ca, Grade 1 (pT1cpNX) ER/PR positive/  adjuvant radiation completed 03-27-2016 to right chest wall, 2018  reclassifiec Stage IIA  . Cancer, metastatic to liver ([place])   . Chronic back pain   . Constipation   . Depression   . Family history of adverse reaction to anesthesia     mother has Copd was kept on vent   . Headache   . Heart murmur   . History of hyperthyroidism    s/p  RAI  . History of radiation therapy 02/11/16- 03/27/16   Right Chest Wall 50.4 Gy in 28 fractions, Right Chest Wall Boost 10 Gy in 5 fractions.   . Hyperlipidemia   . Hypertension   . Hypothyroidism, postradioiodine therapy    endocrinoloigst-  dr balan--  2004  s/p  RAI  . Menorrhagia 2011  . Multiple pulmonary nodules    secondary  metastatic  . PONV (postoperative nausea and vomiting)    patch helps   . SUI (stress urinary incontinence, female)   . Wears contact lenses   . Wears dentures    full upper and lower partial    PAST SURGICAL HISTORY: Past Surgical History:  Procedure Laterality Date  . BREAST LUMPECTOMY WITH NEEDLE LOCALIZATION AND AXILLARY SENTINEL LYMPH NODE BX Right 11/28/2015   Procedure: RIGHT BREAST LUMPECTOMY WITH NEEDLE LOCALIZATION AND AXILLARY SENTINEL LYMPH NODE BX;  Surgeon: Autumn Messing III, MD;  Location: [facility];  Service: General;  Laterality: Right;  . BREAST REDUCTION SURGERY Bilateral 11/28/2015   Procedure: MAMMARY  REDUCTION  (BREAST) BILATERAL;  Surgeon: Crissie Reese, MD;  Location: [facility];  Service: Plastics;  Laterality: Bilateral;  . CESAREAN SECTION  01/12/1993  . COLONOSCOPY    . Dental surgeries    . FEMUR IM NAIL Left 01/28/2018   Procedure: INTRAMEDULLARY (IM) NAIL FEMORAL;  Surgeon: Nicholes Stairs, MD;  Location: [facility];  Service: Orthopedics;  Laterality: Left;  . HYSTEROSCOPY N/A 09/02/2017   Procedure: HYSTEROSCOPY  to remove IUD;  Surgeon: Eldred Manges, MD;  Location: [facility];  Service: Gynecology;  Laterality: N/A;  . Liver biospy    . MASTECTOMY    . MASTECTOMY W/ SENTINEL NODE BIOPSY Bilateral 12/26/2015   Procedure: BILATERAL MASTECTOMY WITH LEFT SENTINEL LYMPH NODE BIOPSY;  Surgeon: Autumn Messing III, MD;  Location: [facility];  Service: General;  Laterality: Bilateral;  . Removal of IUD    . TUBAL LIGATION Bilateral 1995    FAMILY HISTORY Family History  Problem Relation Age of Onset  . Diabetes Mother   . Hypertension Mother   . Hypertension Father   . Diabetes Maternal Grandmother   . Hypertension Sister   . Cancer Paternal Grandmother        colon  . Colon cancer Paternal Grandmother   . Esophageal cancer Neg Hx   .  Stomach cancer Neg Hx   . Rectal cancer Neg Hx    The patient's parents are still living, in their early 58s as of June 2017. The patient has one brother, 2 sisters. On the maternal side there is a history of uterine and prostate cancer. On the paternal side there is a history of colon cancer and possibly uterine cancer. There is no history of breast or ovarian cancer in the family.    GYNECOLOGIC HISTORY:  No LMP recorded. (Menstrual status: IUD).  Menarche age 31. She still having regular periods. She is GX P1, first pregnancy age 24. She used oral contraceptives for a few years remotely, with no complications. She is currently on a Mirena IUD and also is status post bilateral tubal ligation.  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 Her husband Debbie [name] is a Administrator. Their son Debbie [name] is a radio DJ. The patient has no grandchildren. She is not a Ambulance person.   ADVANCED DIRECTIVES: Not in place   HEALTH MAINTENANCE: Social History   Tobacco Use  . Smoking status: Never Smoker  . Smokeless tobacco: Never Used  Substance Use Topics  . Alcohol use: No  . Drug use: No     Colonoscopy: January 2016   PAP: January 2016   Bone density: August 2017 showed a T score of +0.7 normal  Lipid panel:  Allergies  Allergen Reactions  . Ace Inhibitors Cough  . Atorvastatin Other (See Comments)    Joint pain  . Simvastatin Other (See Comments)    Joint pain    Current Outpatient Medications  Medication Sig Dispense Refill  . acetaminophen (TYLENOL) 325 MG tablet Take 325 mg by mouth every 6 (six) hours as needed for mild pain or headache.     . anastrozole (ARIMIDEX) 1 MG tablet Take 1 tablet (1 mg total) by mouth daily. 90 tablet 4  . Calcium-Magnesium 250-125 MG TABS Take 1 tablet by mouth daily. 120 each 4  . cetirizine (ZYRTEC ALLERGY) 10 MG tablet Take 10 mg by mouth daily as needed for allergies.     Marland Kitchen dexamethasone (DECADRON) 0.5 MG/5ML solution Swish 60m in mouth for 253m and spit out. Use 4 times daily for 8 weeks, start with Afinitor. Avoid eating/drinking for 1hr after rinse. (Patient not taking: Reported on 04/01/2018) 500 mL 4  . docusate sodium (COLACE) 100 MG capsule Take 200-400 mg by mouth daily.    . Marland Kitchenverolimus (AFINITOR) 10 MG tablet Take 1 tablet (10 mg total) by mouth daily. Take at approximately the same time each day with a glass of water. 28 tablet 6  . gabapentin (NEURONTIN) 300 MG capsule Take 1 capsule (300 mg total) by mouth 4 (four) times daily. 120 capsule 2  . hydrocortisone cream 1 % Apply 1 application topically daily as needed (for rash).    . Marland Kitchenbuprofen (ADVIL,MOTRIN) 600 MG tablet Take 1 tablet (600 mg total) by mouth every 6 (six) hours as  needed for moderate pain. 60 tablet 0  . levothyroxine (SYNTHROID, LEVOTHROID) 137 MCG tablet Take 137 mcg by mouth daily before breakfast.     . LIVALO 2 MG TABS Take 2 mg by mouth daily.   6  . losartan (COZAAR) 100 MG tablet Take 100 mg by mouth every morning.     . Multiple Vitamin (MULTIVITAMIN) tablet Take 1 tablet by mouth daily.    . Marland Kitchenmega-3 acid ethyl esters (LOVAZA) 1 g capsule Take 1 g  by mouth 2 (two) times daily.     Marland Kitchen oxyCODONE (ROXICODONE) 5 MG immediate release tablet Take 1 tablet (5 mg total) by mouth every 6 (six) hours as needed for severe pain. (Patient not taking: Reported on 04/01/2018) 15 tablet 0  . potassium chloride SA (K-DUR,KLOR-CON) 20 MEQ tablet Take 40 mEq by mouth daily.     . prochlorperazine (COMPAZINE) 10 MG tablet Take 1 tablet (10 mg total) by mouth every 6 (six) hours as needed for nausea or vomiting. (Patient not taking: Reported on 04/01/2018) 30 tablet 0  . traMADol (ULTRAM) 50 MG tablet Take 50 mg by mouth every 6 (six) hours as needed.    . triamterene-hydrochlorothiazide (DYAZIDE) 37.5-25 MG capsule Take 1 each (1 capsule total) by mouth daily. 90 capsule 4  . Vitamin D, Cholecalciferol, 1000 units CAPS Take 1 tablet by mouth daily. (Patient taking differently: Take 1,000 Units by mouth daily. ) 90 capsule 4   No current facility-administered medications for this visit.     OBJECTIVE: Middle-aged African-American woman who appears stated age 37:   04/28/18 1058  BP: (!) 163/77  Pulse: 60  Resp: 18  Temp: 98.8 F (37.1 C)  SpO2: 99%     Body mass index is 40.7 kg/m.    ECOG FS:2 - Symptomatic, <50% confined to bed  Filed Weights   04/28/18 1058  Weight: 256 lb (116.1 kg)   Sclerae unicteric, EOMs intact Oropharynx clear and moist No cervical or supraclavicular adenopathy Lungs no rales or rhonchi Heart regular rate and rhythm Abd soft, nontender, positive bowel sounds MSK no focal spinal tenderness, bilateral grade 1 lower extremity  edema, left greater than right Neuro: nonfocal, well oriented, appropriate affect Breasts: That is post bilateral mastectomy; no evidence of chest wall recurrence; both axillae are benign.    LAB RESULTS:  CMP     Component Value Date/Time   NA 142 03/31/2018 1313   NA 140 07/08/2016 0948   K 3.7 03/31/2018 1313   K 4.1 07/08/2016 0948   CL 106 03/31/2018 1313   CO2 24 03/31/2018 1313   CO2 24 07/08/2016 0948   GLUCOSE 92 03/31/2018 1313   GLUCOSE 89 07/08/2016 0948   BUN 9 03/31/2018 1313   BUN 11.9 07/08/2016 0948   CREATININE 0.81 03/31/2018 1313   CREATININE 0.90 06/22/2017 1147   CREATININE 0.9 07/08/2016 0948   CALCIUM 9.8 03/31/2018 1313   CALCIUM 10.0 07/08/2016 0948   PROT 7.6 03/31/2018 1313   PROT 7.6 07/08/2016 0948   ALBUMIN 3.3 (L) 03/31/2018 1313   ALBUMIN 3.6 07/08/2016 0948   AST 28 03/31/2018 1313   AST 31 06/22/2017 1147   AST 22 07/08/2016 0948   ALT 29 03/31/2018 1313   ALT 45 06/22/2017 1147   ALT 27 07/08/2016 0948   ALKPHOS 164 (H) 03/31/2018 1313   ALKPHOS 115 07/08/2016 0948   BILITOT 0.4 03/31/2018 1313   BILITOT 0.4 06/22/2017 1147   BILITOT 0.35 07/08/2016 0948   GFRNONAA >60 03/31/2018 1313   GFRNONAA >60 06/22/2017 1147   GFRAA >60 03/31/2018 1313   GFRAA >60 06/22/2017 1147    INo results found for: SPEP, UPEP  Lab Results  Component Value Date   WBC 4.4 04/28/2018   NEUTROABS 3.5 04/28/2018   HGB 11.8 (L) 04/28/2018   HCT 36.3 04/28/2018   MCV 89.4 04/28/2018   PLT 255 04/28/2018      Chemistry      Component Value Date/Time   NA  142 03/31/2018 1313   NA 140 07/08/2016 0948   K 3.7 03/31/2018 1313   K 4.1 07/08/2016 0948   CL 106 03/31/2018 1313   CO2 24 03/31/2018 1313   CO2 24 07/08/2016 0948   BUN 9 03/31/2018 1313   BUN 11.9 07/08/2016 0948   CREATININE 0.81 03/31/2018 1313   CREATININE 0.90 06/22/2017 1147   CREATININE 0.9 07/08/2016 0948      Component Value Date/Time   CALCIUM 9.8 03/31/2018 1313    CALCIUM 10.0 07/08/2016 0948   ALKPHOS 164 (H) 03/31/2018 1313   ALKPHOS 115 07/08/2016 0948   AST 28 03/31/2018 1313   AST 31 06/22/2017 1147   AST 22 07/08/2016 0948   ALT 29 03/31/2018 1313   ALT 45 06/22/2017 1147   ALT 27 07/08/2016 0948   BILITOT 0.4 03/31/2018 1313   BILITOT 0.4 06/22/2017 1147   BILITOT 0.35 07/08/2016 0948       No results found for: LABCA2  No components found for: LABCA125  No results for input(s): INR in the last 168 hours.  Urinalysis No results found for: COLORURINE, APPEARANCEUR, LABSPEC, PHURINE, GLUCOSEU, HGBUR, BILIRUBINUR, KETONESUR, PROTEINUR, UROBILINOGEN, NITRITE, LEUKOCYTESUR   STUDIES: Nm Bone Scan Whole Body  Result Date: 04/19/2018 CLINICAL DATA:  Metastatic breast cancer EXAM: NUCLEAR MEDICINE WHOLE BODY BONE SCAN TECHNIQUE: Whole body anterior and posterior images were obtained approximately 3 hours after intravenous injection of radiopharmaceutical. RADIOPHARMACEUTICALS:  20 mCi Technetium-34mMDP IV COMPARISON:  12/24/2017 Radiographic correlation: LEFT femoral radiographs 01/28/2018, CT LEFT femur 01/28/2018 FINDINGS: Abnormal increased tracer localization identified within the spine at spine, T12, L3, and RIGHT L5, at a lateral LEFT mid rib, a posterolateral mid RIGHT rib, and RIGHT ischium. This pattern of uptake is highly suspicious for osseous metastatic disease. Uptake at the proximal LEFT femur corresponds to placement of an IM nail with proximal compression screw across a known lytic lesion of the proximal LEFT femoral metadiaphysis. Uptake at the distal LEFT femur corresponds to a locking screw at the distal aspect of the IM nail. Previously identified lytic metastatic lesions at the LEFT iliac bone on CT are not demonstrated on the current bone scan. Expected urinary tract and soft tissue distribution of tracer. IMPRESSION: Multiple osseous metastatic lesions as above. Interval nailing of the LEFT femur across a known proximal  LEFT femoral metastases. Electronically Signed   By: MLavonia DanaM.D.   On: 04/19/2018 16:31   Mr Liver W Wo Contrast  Result Date: 04/12/2018 CLINICAL DATA:  Metastatic breast cancer with liver metastases. EXAM: MRI ABDOMEN WITHOUT AND WITH CONTRAST TECHNIQUE: Multiplanar multisequence MR imaging of the abdomen was performed both before and after the administration of intravenous contrast. CONTRAST:  10 cc Gadavist COMPARISON:  08/11/2017 FINDINGS: Lower chest: Unremarkable.The tiny pleural-based nodule seen posteriorly in the right lower hemithorax on the prior study are not demonstrated on today's exam. Hepatobiliary: Diffuse fatty deposition noted within the liver parenchyma. The numerous small liver lesions persist and are similar. These lesions were previously measured on T2 sequences which are used for measurement acquisition today. -1.8 cm segment VII lesion measured previously is 1.7 cm today (13/3). -1.4 cm segment VI lesion measured previously is 1.4 cm today (26/3). -1.1 cm subcapsular segment VIII lesion measured previously is 1.0 cm today (16/3). -numerous other tiny T2 hyperintensities distributed throughout both hepatic lobes are also similar to prior. No definite new metastatic lesions evident. Gallbladder is nondistended without evidence for stone disease. No intrahepatic or extrahepatic biliary dilation.  Pancreas: No focal mass lesion. No dilatation of the main duct. No intraparenchymal cyst. No peripancreatic edema. Spleen:  No splenomegaly. No focal mass lesion. Adrenals/Urinary Tract: No adrenal nodule or mass. Kidneys unremarkable. Stomach/Bowel: Stomach is nondistended. No gastric wall thickening. No evidence of outlet obstruction. Duodenum is normally positioned as is the ligament of Treitz. No small bowel or colonic dilatation within the visualized abdomen. Vascular/Lymphatic: No abdominal aortic aneurysm. No abdominal lymphadenopathy. Other:  No intraperitoneal free fluid.  Musculoskeletal: Numerous focal areas of abnormal enhancement identified in the visualized thoracolumbar spine, consistent with metastatic disease. IMPRESSION: 1. Numerous small lesions scattered throughout both hepatic lobes consistent with metastatic disease. No appreciable interval change in size or number of the hepatic lesions since 08/11/2017. 2. Similar appearance hyperenhancing metastases within the thoracolumbar spine. 3. The tiny pulmonary nodule seen previously in the posterior left base at the costophrenic sulcus are not evident on the current exam. 4. Hepatic steatosis. Electronically Signed   By: Misty Stanley M.D.   On: 04/12/2018 16:32    ELIGIBLE FOR AVAILABLE RESEARCH PROTOCOL: PALLAS, but inelegible because of Mirena IUD   ASSESSMENT: 52 y.o.  woman status post right breast upper inner quadrant biopsy 10/15/2015 for a clinical T2-T3 N0, stage II invasive ductal carcinoma, estrogen and progesterone receptor positive, HER-2 nonamplified, with an Mib-1 of 70%  (1) status post right lumpectomy and sentinel lymph node sampling with oncoplastic breast reduction 11/28/2015 for a pT2 pN1, stage IIB invasive ductal carcinoma, grade 1, with negative margins (1/1 sentinel node positive)  (a) reclassified as stage IIA in the 2018 prognostic revision   (2) left reduction mammoplasty 11/28/2015 unexpectedly found a pT1c pNX invasive ductal carcinoma, grade 1, estrogen and progesterone receptor positive, HER-2 not amplified, with an MIB-1 of 3%  (3) Mammaprint from the right sided breast cancer was "low risk", predicting a 98% chance of no disease recurrence within 5 years with anti-estrogens as the only systemic therapy. It also predicts minimal to no benefit from chemotherapy  (4) status post bilateral mastectomies with bilateral sentinel lymph node sampling 12/26/2015, showing  (a) on the right, no residual carcinoma (0/3 nodes involved)  (b) on the left, focal ductal carcinoma in  situ, 0.2 cm, with negative margins; (0/4 nodes involved)  (5) adjuvant radiation 02/11/16 - 03/27/16    1) Right chest wall: 50.4 Gy in 28 fractions               2) Right chest wall boost: 10 Gy in 5 fractions   (6) started on tamoxifen neoadjuvantly 10/23/2015 to allow for expected delays in definitive local treatment  (a) Mirena IUD in place  (b) tamoxifen discontinued 08/13/2017, with progression  (7) since she had 4 lymph nodes removed from each axilla but also receive radiation on the right, lab draws should preferentially be obtained from the left arm   METASTATIC DISEASE: April 2019 (8) CT scan of the abdomen and pelvis 08/04/2017, MRI of the liver and total spine MRI 08/11/2017 and CT scan of the chest and brain on 08/13/2017 showed liver and bone metastases  (a) liver biopsy 09/07/2017 confirms estrogen receptor positive, progesterone receptor negative, HER-2 negative metastatic breast cancer  (b) chest CT scan 08/13/2017 shows multiple bilateral pulmonary nodules.  (c) CT of the brain with and without contrast 08/26/2017 shows no evidence of metastatic disease to the brain  (d) CA 27-29 is informative  (9) fulvestrant started 08/17/2017  (a) palbociclib started 08/31/2017  (b) fulvestrant and palbociclib discontinued 01/04/2018 with  evidence of progression in the liver  (10) denosumab/Xgeva started 09/14/2017  (11) anastrozole started 01/04/2018  (a) everolimus to started in early 01/2018  (12) 01/28/18 prophylactic left femur intramedullary nail surgery for impending left femur pathologic fracture  (a) palliative radiation with Dr. [name] to the left femur and left ilium from 10/9 to 10/22   PLAN: Debbie [name] is tolerating the Afinitor and Arimidex remarkably well.  The recent scans show stable disease.  The tumor marker continues to fall, which is favorable.  Accordingly we are continuing with this treatment plan.  We held the denosumab/Xgeva last month because she has an  erosion secondary to her dentures and I think it is better to wait until that is a little bit more resolved before we resume the Xgeva.  She will receive it 06/09/2018 when she sees me next time  I recommend that she use compression stockings for the lower extremity lymphedema but she finds this very uncomfortable.  I have increased her diuretic to 25 mg daily, and I also added gabapentin, which she will try to take 4 times a day.  If she cannot tolerated 4 times a day because of sleepiness she will take it in the evening and at bedtime only  Hopefully with these measures she will feel more comfortable.  She will see me again at the end of January.  She will be restaged in April  She knows to call for any other issue that may develop before the next visit here.   Debbie [name], Virgie Dad, MD  04/28/18 11:23 AM Medical Oncology and Hematology [facility] [address] [place], [place] [address] Tel. [phone]    Fax. [phone]   I, Jacqualyn Posey am acting as a Education administrator for Chauncey Cruel, MD.   I, Lurline Del MD, have reviewed the above documentation for accuracy and completeness, and I agree with the above.

## 2018-04-28 ENCOUNTER — Inpatient Hospital Stay (HOSPITAL_BASED_OUTPATIENT_CLINIC_OR_DEPARTMENT_OTHER): Payer: 59 | Admitting: Oncology

## 2018-04-28 ENCOUNTER — Inpatient Hospital Stay: Payer: 59 | Attending: Oncology

## 2018-04-28 ENCOUNTER — Inpatient Hospital Stay: Payer: 59

## 2018-04-28 ENCOUNTER — Telehealth: Payer: Self-pay | Admitting: Oncology

## 2018-04-28 VITALS — BP 163/77 | HR 60 | Temp 98.8°F | Resp 18 | Ht 66.5 in | Wt 256.0 lb

## 2018-04-28 DIAGNOSIS — C50211 Malignant neoplasm of upper-inner quadrant of right female breast: Secondary | ICD-10-CM | POA: Insufficient documentation

## 2018-04-28 DIAGNOSIS — I10 Essential (primary) hypertension: Secondary | ICD-10-CM | POA: Insufficient documentation

## 2018-04-28 DIAGNOSIS — Z79810 Long term (current) use of selective estrogen receptor modulators (SERMs): Secondary | ICD-10-CM | POA: Diagnosis not present

## 2018-04-28 DIAGNOSIS — R918 Other nonspecific abnormal finding of lung field: Secondary | ICD-10-CM | POA: Diagnosis not present

## 2018-04-28 DIAGNOSIS — Z170 Estrogen receptor positive status [ER+]: Secondary | ICD-10-CM | POA: Insufficient documentation

## 2018-04-28 DIAGNOSIS — C7981 Secondary malignant neoplasm of breast: Secondary | ICD-10-CM | POA: Diagnosis not present

## 2018-04-28 DIAGNOSIS — C50011 Malignant neoplasm of nipple and areola, right female breast: Secondary | ICD-10-CM

## 2018-04-28 DIAGNOSIS — Z79899 Other long term (current) drug therapy: Secondary | ICD-10-CM | POA: Diagnosis not present

## 2018-04-28 DIAGNOSIS — Z975 Presence of (intrauterine) contraceptive device: Secondary | ICD-10-CM | POA: Diagnosis not present

## 2018-04-28 DIAGNOSIS — Z9013 Acquired absence of bilateral breasts and nipples: Secondary | ICD-10-CM

## 2018-04-28 DIAGNOSIS — C7951 Secondary malignant neoplasm of bone: Secondary | ICD-10-CM | POA: Diagnosis not present

## 2018-04-28 DIAGNOSIS — C50112 Malignant neoplasm of central portion of left female breast: Secondary | ICD-10-CM

## 2018-04-28 DIAGNOSIS — C787 Secondary malignant neoplasm of liver and intrahepatic bile duct: Secondary | ICD-10-CM

## 2018-04-28 DIAGNOSIS — C50012 Malignant neoplasm of nipple and areola, left female breast: Secondary | ICD-10-CM

## 2018-04-28 LAB — CBC WITH DIFFERENTIAL/PLATELET
Abs Immature Granulocytes: 0.03 10*3/uL (ref 0.00–0.07)
Basophils Absolute: 0 10*3/uL (ref 0.0–0.1)
Basophils Relative: 0 %
Eosinophils Absolute: 0.1 10*3/uL (ref 0.0–0.5)
Eosinophils Relative: 3 %
HCT: 36.3 % (ref 36.0–46.0)
Hemoglobin: 11.8 g/dL — ABNORMAL LOW (ref 12.0–15.0)
Immature Granulocytes: 1 %
Lymphocytes Relative: 8 %
Lymphs Abs: 0.4 10*3/uL — ABNORMAL LOW (ref 0.7–4.0)
MCH: 29.1 pg (ref 26.0–34.0)
MCHC: 32.5 g/dL (ref 30.0–36.0)
MCV: 89.4 fL (ref 80.0–100.0)
Monocytes Absolute: 0.4 10*3/uL (ref 0.1–1.0)
Monocytes Relative: 9 %
Neutro Abs: 3.5 10*3/uL (ref 1.7–7.7)
Neutrophils Relative %: 79 %
Platelets: 255 10*3/uL (ref 150–400)
RBC: 4.06 MIL/uL (ref 3.87–5.11)
RDW: 13.6 % (ref 11.5–15.5)
WBC: 4.4 10*3/uL (ref 4.0–10.5)
nRBC: 0 % (ref 0.0–0.2)

## 2018-04-28 LAB — COMPREHENSIVE METABOLIC PANEL
ALT: 34 U/L (ref 0–44)
AST: 27 U/L (ref 15–41)
Albumin: 3.3 g/dL — ABNORMAL LOW (ref 3.5–5.0)
Alkaline Phosphatase: 151 U/L — ABNORMAL HIGH (ref 38–126)
Anion gap: 10 (ref 5–15)
BUN: 9 mg/dL (ref 6–20)
CO2: 24 mmol/L (ref 22–32)
Calcium: 9.4 mg/dL (ref 8.9–10.3)
Chloride: 106 mmol/L (ref 98–111)
Creatinine, Ser: 0.81 mg/dL (ref 0.44–1.00)
GFR calc Af Amer: >60 mL/min (ref >60)
GFR calc non Af Amer: >60 mL/min (ref >60)
Glucose, Bld: 137 mg/dL — ABNORMAL HIGH (ref 70–99)
Potassium: 3.7 mmol/L (ref 3.5–5.1)
Sodium: 140 mmol/L (ref 135–145)
Total Bilirubin: 0.3 mg/dL (ref 0.3–1.2)
Total Protein: 7.3 g/dL (ref 6.5–8.1)

## 2018-04-28 MED ORDER — GABAPENTIN 300 MG PO CAPS: 300.0000 mg | ORAL_CAPSULE | Freq: Four times a day (QID) | ORAL | 2 refills | Status: DC

## 2018-04-28 MED ORDER — DENOSUMAB 120 MG/1.7ML ~~LOC~~ SOLN
SUBCUTANEOUS | Status: AC
  Filled 2018-04-28: qty 1.7

## 2018-04-28 MED ORDER — TRIAMTERENE-HCTZ 37.5-25 MG PO CAPS: 1.0000 | ORAL_CAPSULE | Freq: Every day | ORAL | 4 refills | Status: DC

## 2018-04-28 MED FILL — TRIAMTERENE/HCTZ 37.5/25 CP: 37.5-25 | 90 days supply | Qty: 90 | Fill #0

## 2018-04-28 MED FILL — AFINITOR 10 MG TABLET: 10 | 28 days supply | Qty: 28 | Fill #4

## 2018-04-28 MED FILL — GABAPENTIN 300 MG CAPSULE: 300 | 30 days supply | Qty: 120 | Fill #0

## 2018-04-28 MED FILL — LOSARTAN POTASSIUM 100 MG T: 100 | 30 days supply | Qty: 30 | Fill #2

## 2018-04-28 NOTE — Telephone Encounter (Signed)
Gave avs and calendar ° °

## 2018-04-29 LAB — CANCER ANTIGEN 27.29: CA 27.29: 51.3 U/mL — ABNORMAL HIGH (ref 0.0–38.6)

## 2018-05-02 ENCOUNTER — Telehealth: Payer: Self-pay | Admitting: *Deleted

## 2018-05-02 MED ORDER — DICLOFENAC SODIUM 1 % TD GEL: 4.0000 g | Freq: Four times a day (QID) | TRANSDERMAL | 2 refills | Status: DC

## 2018-05-02 MED FILL — DICLOFENAC SODIUM 1 % GEL: 1 | 13 days supply | Qty: 200 | Fill #0

## 2018-05-02 NOTE — Telephone Encounter (Signed)
This RN spoke with pt per her call regarding use of gabapentin prescribed last week.  Debbie [name] states good benefit with start of medication but over the past few nights she is waking up with increased leg pain, itching, and tingling.  Per review with MD-he recommends increasing nighttime dose of gabapentin to 600mg .  Prescription also given for voltaren gel for possible benefit.  Above discussed with pt understanding request for slow titration in dose for less drowsiness.  Prescription for voltaren sent to verified pharmacy

## 2018-05-17 MED FILL — LEVOTHYROXINE 137 MCG TABLE: 137 | 90 days supply | Qty: 90 | Fill #0

## 2018-05-19 ENCOUNTER — Telehealth: Payer: Self-pay | Admitting: *Deleted

## 2018-05-19 MED ORDER — GABAPENTIN 300 MG PO CAPS: ORAL_CAPSULE | ORAL | 2 refills | Status: DC

## 2018-05-19 MED ORDER — IRON 325 (65 FE) MG PO TABS: 1.0000 | ORAL_TABLET | Freq: Every day | ORAL | 2 refills | Status: DC

## 2018-05-19 MED FILL — GABAPENTIN 300 MG CAPSULE: 300 | 30 days supply | Qty: 180 | Fill #0

## 2018-05-19 NOTE — Telephone Encounter (Signed)
This RN spoke with pt per her call regarding on going discomfort in legs - helped somewhat with gabapentin " but still waking me up at night "  Discomfort is bilateral " burning and electrical shock waves kind of pain ".  She has mild swelling that occurs post getting out of bed.  Debbie [name] states she has increased her gabapentin dose as discussed and is currently using 300 mg am and at lunch then 600 mg at 6 pm and midnight.  " it seems to help at first but then after 7 pm when I sit down and put my legs up they really start hurting more "  Debbie [name] is asking if there is something else as well as need for refill with increased dispense amount and new instructions for insurance coverage.  Of note pt used the voltaren gel x 1 stating " that made my legs feel worse ".  Per review with MD- and post further chart review - recommended in addition to current dose of gabapentin pt should start taking an iron tablet daily. Labs for anemia can be obtained at next scheduled visit.  This RN discussed above with pt with verbalized understanding.  Refill for gabapentin sent with appropriate increased dispense amount and new instructions.

## 2018-05-23 DIAGNOSIS — E039 Hypothyroidism, unspecified: Secondary | ICD-10-CM | POA: Diagnosis not present

## 2018-05-23 DIAGNOSIS — G620 Drug-induced polyneuropathy: Secondary | ICD-10-CM | POA: Diagnosis not present

## 2018-05-23 DIAGNOSIS — I10 Essential (primary) hypertension: Secondary | ICD-10-CM | POA: Diagnosis not present

## 2018-05-23 DIAGNOSIS — Z0000 Encounter for general adult medical examination without abnormal findings: Secondary | ICD-10-CM | POA: Diagnosis not present

## 2018-05-23 DIAGNOSIS — I890 Lymphedema, not elsewhere classified: Secondary | ICD-10-CM | POA: Diagnosis not present

## 2018-05-23 DIAGNOSIS — D0510 Intraductal carcinoma in situ of unspecified breast: Secondary | ICD-10-CM | POA: Diagnosis not present

## 2018-05-23 DIAGNOSIS — J309 Allergic rhinitis, unspecified: Secondary | ICD-10-CM | POA: Diagnosis not present

## 2018-05-23 DIAGNOSIS — E0500 Thyrotoxicosis with diffuse goiter without thyrotoxic crisis or storm: Secondary | ICD-10-CM | POA: Diagnosis not present

## 2018-05-25 DIAGNOSIS — I10 Essential (primary) hypertension: Secondary | ICD-10-CM | POA: Diagnosis not present

## 2018-05-25 DIAGNOSIS — Z01419 Encounter for gynecological examination (general) (routine) without abnormal findings: Secondary | ICD-10-CM | POA: Diagnosis not present

## 2018-05-25 DIAGNOSIS — N393 Stress incontinence (female) (male): Secondary | ICD-10-CM | POA: Diagnosis not present

## 2018-05-25 DIAGNOSIS — Z124 Encounter for screening for malignant neoplasm of cervix: Secondary | ICD-10-CM | POA: Diagnosis not present

## 2018-05-25 DIAGNOSIS — C50919 Malignant neoplasm of unspecified site of unspecified female breast: Secondary | ICD-10-CM | POA: Diagnosis not present

## 2018-05-25 DIAGNOSIS — Z6841 Body Mass Index (BMI) 40.0 and over, adult: Secondary | ICD-10-CM | POA: Diagnosis not present

## 2018-05-25 DIAGNOSIS — E039 Hypothyroidism, unspecified: Secondary | ICD-10-CM | POA: Diagnosis not present

## 2018-05-25 DIAGNOSIS — C7981 Secondary malignant neoplasm of breast: Secondary | ICD-10-CM | POA: Diagnosis not present

## 2018-05-25 DIAGNOSIS — D259 Leiomyoma of uterus, unspecified: Secondary | ICD-10-CM | POA: Diagnosis not present

## 2018-05-26 MED FILL — LIVALO 2 MG TABLET: 2 | 90 days supply | Qty: 90 | Fill #0

## 2018-05-30 ENCOUNTER — Telehealth: Payer: Self-pay

## 2018-05-30 NOTE — Telephone Encounter (Signed)
Oral Oncology Patient Advocate Encounter  Doctor Phillips notified me that the Afinitor copay card that was on file is no longer working. I was able to secure another copay card and the information is as follows and has been shared with [facility].  BIN: [id number] PCN: [id number] Grp: [id number] ID: [id number]  This copay card only covered about $100 of the copay so I called the Help desk and they gave me the following credit card information for the pharmacy to bill at the register.  Card # [id number] Exp. 7/22 CVV: 243 Zip code: [address] (patient's zip code)  [place] [facility] [place] Phone [phone] Fax [phone]

## 2018-05-31 ENCOUNTER — Telehealth: Payer: Self-pay | Admitting: *Deleted

## 2018-05-31 MED ORDER — GABAPENTIN 300 MG PO CAPS: ORAL_CAPSULE | ORAL | 2 refills | Status: DC

## 2018-05-31 MED FILL — AFINITOR 10 MG TABLET: 10 | 28 days supply | Qty: 28 | Fill #5

## 2018-05-31 NOTE — Telephone Encounter (Signed)
This RN spoke with pt per call stating she has had no improvement since starting iron replacement.  Per her description she states the discomfort " starts after lunch then slowly increases - by the evening when I get home and take the 6 pm dose of gabapentin it has reaved up pretty good- so that even after I take the 12 midnight dose I am not able to fall asleep until 2 am '  She states discomfort as "numbness, tingling, burning, with like electric shocks occurring and then my whole leg will feel cold "  Discomfort is worse in her left leg which had a rod placed in it last year.  She denies any changes in bladder or bowel habits.  Per above discussion plan is for pt to increase pm dosing of gabapentin to 3 tablets equaling 900 mg and to change her dose time from 6 pm to 4 pm and from 12 midnight to 10 pm.  She states she has had no issues of drowsiness with the gabapentin.  Of note she does have swelling occurring in her lower extremities that worsen as the day goes on. She is currently on diuretics.  This note will be left with MD for possible further recommendations.

## 2018-06-01 MED FILL — LOSARTAN POTASSIUM 100 MG T: 100 | 30 days supply | Qty: 30 | Fill #3

## 2018-06-03 NOTE — Telephone Encounter (Signed)
Oral Oncology Pharmacist Encounter  Received call from the [facility] That patient was at the pharmacy to pick up her next fill of Afinitor. One time credit card provided by [employer] on 05/30/2018 was rejecting when the pharmacy was trying to bill it. Copayment due at the pharmacy is $1053, and this is prohibitively expensive for the patient.  I called [employer] at [phone] to obtain new coverage information. I was able to conference call with [employer] and the outpatient pharmacy to obtain new one-time credit card information to cover the $1053.  ID: [id number] Exp: 08/22 CVV: 091 Zip code: [id number]  The outpatient pharmacy was able to successfully bill the one-time [employer] while on the phone with me to be able to dispense the medication to the patient.  Johny Drilling, PharmD, BCPS, BCOP  06/03/2018 10:47 AM Oral Oncology Clinic [phone]

## 2018-06-06 ENCOUNTER — Other Ambulatory Visit: Payer: Self-pay | Admitting: Oncology

## 2018-06-08 NOTE — Progress Notes (Signed)
[facility]  Telephone:(336) [phone] [phone]) [phone]     ID: GLORIANNE PROCTOR DOB: 09-29-1965  MR#: [id number]  IRC#:[id number]  Patient Care Team: Kelton Pillar, MD as PCP - General (Family Medicine) Jacelyn Pi, MD as Consulting Physician (Endocrinology) Leo Grosser, Seymour Bars, MD as Consulting Physician (Obstetrics and Gynecology) Jovita Kussmaul, MD as Consulting Physician (General Surgery) Kaelani Kendrick, Virgie Dad, MD as Consulting Physician (Oncology) [name], MD as Attending Physician (Radiation Oncology) Belva Crome, MD as Consulting Physician (Cardiology) Crissie Reese, MD as Consulting Physician (Plastic Surgery) [name], [name], NP as Nurse Practitioner (Hematology and Oncology) Margarette Canada (Inactive) PCP: Kelton Pillar, MD OTHER MD:  CHIEF COMPLAINT: Estrogen receptor positive stage IV  breast cancer  CURRENT TREATMENT:  Anastrozole, affinitor; denosumab/Xgeva  INTERVAL HISTORY: Austin returns today for follow-up of her estrogen receptor positive stage IV breast cancer.   The patient continues on anastrozole, which she is tolerating well.   The patient also continues on affinitor, which she is tolerating well.   Finally, the patient also continues on denosumab/Xgeva, which she is tolerating well.    Lab Results  Component Value Date   CA2729 51.3 (H) 04/28/2018   CA2729 66.8 (H) 03/31/2018   CA2729 115.1 (H) 03/01/2018   CA2729 232.3 (H) 02/01/2018   CA2729 391.7 (H) 01/04/2018     REVIEW OF SYSTEMS: Chandell reports body soreness, especially in her forearms. She notes she carries her pocketbook on her left forearm and is typing all day. She reports burning, tingling, itching, and swelling in her legs, with no relief from gabapentin or a menthol cream. The patient denies unusual headaches, visual changes, nausea, vomiting, stiff neck, dizziness, or gait imbalance. There has been no cough, phlegm production, or pleurisy, no  chest pain or pressure, and no change in bowel or bladder habits. The patient denies fever, rash, bleeding, unexplained fatigue or unexplained weight loss. A detailed review of systems was otherwise entirely negative.   PAST MEDICAL HISTORY: Past Medical History:  Diagnosis Date  . Anxiety   . Arthritis   . Bone metastasis ([place])   . Breast cancer of upper-inner quadrant of right female breast [facility]) oncologist-  dr [name]--- 04/ 2019 ER/PR+ Stage IV w/ metastatic disease to liver, total spine, lung, bone   dx 06/ 2017  right breast invasive ductal carcinoma, Stage IIB, ER/PR + (pT2 pN1), grade 1-- s/p  right breast lumpecotmy w/ sln bx's and bilateral breast reduction 11-28-2015/  left breast with reduction , dx invasive ductal ca, Grade 1 (pT1cpNX) ER/PR positive/  adjuvant radiation completed 03-27-2016 to right chest wall, 2018  reclassifiec Stage IIA  . Cancer, metastatic to liver ([place])   . Chronic back pain   . Constipation   . Depression   . Family history of adverse reaction to anesthesia     mother has Copd was kept on vent   . Headache   . Heart murmur   . History of hyperthyroidism    s/p  RAI  . History of radiation therapy 02/11/16- 03/27/16   Right Chest Wall 50.4 Gy in 28 fractions, Right Chest Wall Boost 10 Gy in 5 fractions.   . Hyperlipidemia   . Hypertension   . Hypothyroidism, postradioiodine therapy    endocrinoloigst-  dr balan--  2004  s/p  RAI  . Menorrhagia 2011  . Multiple pulmonary nodules    secondary  metastatic  . PONV (postoperative nausea and vomiting)    patch helps   . SUI (  stress urinary incontinence, female)   . Wears contact lenses   . Wears dentures    full upper and lower partial    PAST SURGICAL HISTORY: Past Surgical History:  Procedure Laterality Date  . BREAST LUMPECTOMY WITH NEEDLE LOCALIZATION AND AXILLARY SENTINEL LYMPH NODE BX Right 11/28/2015   Procedure: RIGHT BREAST LUMPECTOMY WITH NEEDLE LOCALIZATION AND AXILLARY SENTINEL  LYMPH NODE BX;  Surgeon: Autumn Messing III, MD;  Location: [facility];  Service: General;  Laterality: Right;  . BREAST REDUCTION SURGERY Bilateral 11/28/2015   Procedure: MAMMARY REDUCTION  (BREAST) BILATERAL;  Surgeon: Crissie Reese, MD;  Location: [facility];  Service: Plastics;  Laterality: Bilateral;  . CESAREAN SECTION  01/12/1993  . COLONOSCOPY    . Dental surgeries    . FEMUR IM NAIL Left 01/28/2018   Procedure: INTRAMEDULLARY (IM) NAIL FEMORAL;  Surgeon: Nicholes Stairs, MD;  Location: [facility];  Service: Orthopedics;  Laterality: Left;  . HYSTEROSCOPY N/A 09/02/2017   Procedure: HYSTEROSCOPY  to remove IUD;  Surgeon: Eldred Manges, MD;  Location: [facility];  Service: Gynecology;  Laterality: N/A;  . Liver biospy    . MASTECTOMY    . MASTECTOMY W/ SENTINEL NODE BIOPSY Bilateral 12/26/2015   Procedure: BILATERAL MASTECTOMY WITH LEFT SENTINEL LYMPH NODE BIOPSY;  Surgeon: Autumn Messing III, MD;  Location: [facility];  Service: General;  Laterality: Bilateral;  . Removal of IUD    . TUBAL LIGATION Bilateral 1995    FAMILY HISTORY Family History  Problem Relation Age of Onset  . Diabetes Mother   . Hypertension Mother   . Hypertension Father   . Diabetes Maternal Grandmother   . Hypertension Sister   . Cancer Paternal Grandmother        colon  . Colon cancer Paternal Grandmother   . Esophageal cancer Neg Hx   . Stomach cancer Neg Hx   . Rectal cancer Neg Hx    The patient's parents are still living, in their early 79s as of June 2017. The patient has one brother, 2 sisters. On the maternal side there is a history of uterine and prostate cancer. On the paternal side there is a history of colon cancer and possibly uterine cancer. There is no history of breast or ovarian cancer in the family.    GYNECOLOGIC HISTORY:  No LMP recorded. (Menstrual status: IUD).  Menarche age 53. She still having regular periods. She is GX P1, first pregnancy age 18. She used oral contraceptives for a  few years remotely, with no complications. She is currently on a Mirena IUD and also is status post bilateral tubal ligation.  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 Her husband [name] is a Administrator. Their son [name] is a radio DJ. The patient has no grandchildren. She is not a Ambulance person.   ADVANCED DIRECTIVES: Not in place   HEALTH MAINTENANCE: Social History   Tobacco Use  . Smoking status: Never Smoker  . Smokeless tobacco: Never Used  Substance Use Topics  . Alcohol use: No  . Drug use: No     Colonoscopy: January 2016   PAP: January 2016   Bone density: August 2017 showed a T score of +0.7 normal  Lipid panel:  Allergies  Allergen Reactions  . Ace Inhibitors Cough  . Atorvastatin Other (See Comments)    Joint pain  . Simvastatin Other (See Comments)    Joint pain    Current Outpatient Medications  Medication Sig Dispense Refill  . acetaminophen (TYLENOL) 325 MG tablet Take 325 mg by mouth every 6 (six) hours as needed for mild pain or headache.     . anastrozole (ARIMIDEX) 1 MG tablet Take 1 tablet (1 mg total) by mouth daily. 90 tablet 4  . Calcium-Magnesium 250-125 MG TABS Take 1 tablet by mouth daily. 120 each 4  . cetirizine (ZYRTEC ALLERGY) 10 MG tablet Take 10 mg by mouth daily as needed for allergies.     Marland Kitchen dexamethasone (DECADRON) 0.5 MG/5ML solution Swish 51m in mouth for 250m and spit out. Use 4 times daily for 8 weeks, start with Afinitor. Avoid eating/drinking for 1hr after rinse. 500 mL 4  . diclofenac sodium (VOLTAREN) 1 % GEL Apply 4 g topically 4 (four) times daily. 2 Tube 2  . docusate sodium (COLACE) 100 MG capsule Take 200-400 mg by mouth daily.    . Marland Kitchenverolimus (AFINITOR) 10 MG tablet Take 1 tablet (10 mg total) by mouth daily. Take at approximately the same time each day with a glass of water. 28 tablet 6  . Ferrous Sulfate (IRON) 325 (65 Fe) MG TABS Take 1 tablet (325 mg total) by mouth daily. 30 each 2   . hydrocortisone cream 1 % Apply 1 application topically daily as needed (for rash).    . Marland Kitchenbuprofen (ADVIL,MOTRIN) 600 MG tablet Take 1 tablet (600 mg total) by mouth every 6 (six) hours as needed for moderate pain. 60 tablet 0  . levothyroxine (SYNTHROID, LEVOTHROID) 137 MCG tablet Take 137 mcg by mouth daily before breakfast.     . LIVALO 2 MG TABS Take 2 mg by mouth daily.   6  . LORazepam (ATIVAN) 0.5 MG tablet Take 1 tablet (0.5 mg total) by mouth at bedtime as needed for anxiety. 30 tablet 0  . losartan (COZAAR) 100 MG tablet Take 100 mg by mouth every morning.     . Multiple Vitamin (MULTIVITAMIN) tablet Take 1 tablet by mouth daily.    . Marland Kitchenmega-3 acid ethyl esters (LOVAZA) 1 g capsule Take 1 g by mouth 2 (two) times daily.     . potassium chloride SA (K-DUR,KLOR-CON) 20 MEQ tablet Take 40 mEq by mouth daily.     . pregabalin (LYRICA) 100 MG capsule Take 1 capsule (100 mg total) by mouth 2 (two) times daily. 60 capsule 6  . prochlorperazine (COMPAZINE) 10 MG tablet Take 1 tablet (10 mg total) by mouth every 6 (six) hours as needed for nausea or vomiting. (Patient not taking: Reported on 04/01/2018) 30 tablet 0  . triamterene-hydrochlorothiazide (DYAZIDE) 37.5-25 MG capsule Take 1 each (1 capsule total) by mouth daily. 90 capsule 4  . Vitamin D, Cholecalciferol, 1000 units CAPS Take 1 tablet by mouth daily. (Patient taking differently: Take 1,000 Units by mouth daily. ) 90 capsule 4   No current facility-administered medications for this visit.     OBJECTIVE: Middle-aged African-American woman who appears stated age Vi89  06/09/18 0921  BP: (!) 146/95  Resp: 18  Temp: 98.5 F (36.9 C)     Body mass index is 42.31 kg/m.    ECOG FS:2 - Symptomatic, <50% confined to bed  [facility]   06/09/18 0921  Weight: 266 lb 1.6 oz (120.7 kg)   Sclerae unicteric, EOMs intact Oropharynx clear and moist No cervical or supraclavicular adenopathy Lungs no rales or rhonchi Heart regular  rate and rhythm Abd soft, nontender, positive bowel sounds MSK no focal spinal tenderness, bilateral grade 1  lower extremity edema, left greater than right Neuro: nonfocal, well oriented, appropriate affect Breasts: That is post bilateral mastectomy; no evidence of chest wall recurrence; both axillae are benign.    LAB RESULTS:  CMP     Component Value Date/Time   NA 141 06/09/2018 0852   NA 140 07/08/2016 0948   K 3.5 06/09/2018 0852   K 4.1 07/08/2016 0948   CL 105 06/09/2018 0852   CO2 27 06/09/2018 0852   CO2 24 07/08/2016 0948   GLUCOSE 108 (H) 06/09/2018 0852   GLUCOSE 89 07/08/2016 0948   BUN 11 06/09/2018 0852   BUN 11.9 07/08/2016 0948   CREATININE 0.85 06/09/2018 0852   CREATININE 0.90 06/22/2017 1147   CREATININE 0.9 07/08/2016 0948   CALCIUM 9.3 06/09/2018 0852   CALCIUM 10.0 07/08/2016 0948   PROT 7.1 06/09/2018 0852   PROT 7.6 07/08/2016 0948   ALBUMIN 3.2 (L) 06/09/2018 0852   ALBUMIN 3.6 07/08/2016 0948   AST 23 06/09/2018 0852   AST 31 06/22/2017 1147   AST 22 07/08/2016 0948   ALT 30 06/09/2018 0852   ALT 45 06/22/2017 1147   ALT 27 07/08/2016 0948   ALKPHOS 147 (H) 06/09/2018 0852   ALKPHOS 115 07/08/2016 0948   BILITOT 0.3 06/09/2018 0852   BILITOT 0.4 06/22/2017 1147   BILITOT 0.35 07/08/2016 0948   GFRNONAA >60 06/09/2018 0852   GFRNONAA >60 06/22/2017 1147   GFRAA >60 06/09/2018 0852   GFRAA >60 06/22/2017 1147    INo results found for: SPEP, UPEP  Lab Results  Component Value Date   WBC 4.1 06/09/2018   NEUTROABS 3.0 06/09/2018   HGB 11.4 (L) 06/09/2018   HCT 34.4 (L) 06/09/2018   MCV 87.3 06/09/2018   PLT 291 06/09/2018      Chemistry      Component Value Date/Time   NA 141 06/09/2018 0852   NA 140 07/08/2016 0948   K 3.5 06/09/2018 0852   K 4.1 07/08/2016 0948   CL 105 06/09/2018 0852   CO2 27 06/09/2018 0852   CO2 24 07/08/2016 0948   BUN 11 06/09/2018 0852   BUN 11.9 07/08/2016 0948   CREATININE 0.85 06/09/2018 0852    CREATININE 0.90 06/22/2017 1147   CREATININE 0.9 07/08/2016 0948      Component Value Date/Time   CALCIUM 9.3 06/09/2018 0852   CALCIUM 10.0 07/08/2016 0948   ALKPHOS 147 (H) 06/09/2018 0852   ALKPHOS 115 07/08/2016 0948   AST 23 06/09/2018 0852   AST 31 06/22/2017 1147   AST 22 07/08/2016 0948   ALT 30 06/09/2018 0852   ALT 45 06/22/2017 1147   ALT 27 07/08/2016 0948   BILITOT 0.3 06/09/2018 0852   BILITOT 0.4 06/22/2017 1147   BILITOT 0.35 07/08/2016 0948       No results found for: LABCA2  No components found for: LABCA125  No results for input(s): INR in the last 168 hours.  Urinalysis No results found for: COLORURINE, APPEARANCEUR, LABSPEC, PHURINE, GLUCOSEU, HGBUR, BILIRUBINUR, KETONESUR, PROTEINUR, UROBILINOGEN, NITRITE, LEUKOCYTESUR   STUDIES: No results found.  ELIGIBLE FOR AVAILABLE RESEARCH PROTOCOL: PALLAS, but inelegible because of Mirena IUD   ASSESSMENT: 53 y.o. [place] woman status post right breast upper inner quadrant biopsy 10/15/2015 for a clinical T2-T3 N0, stage II invasive ductal carcinoma, estrogen and progesterone receptor positive, HER-2 nonamplified, with an Mib-1 of 70%  (1) status post right lumpectomy and sentinel lymph node sampling with oncoplastic breast reduction 11/28/2015 for a pT2  pN1, stage IIB invasive ductal carcinoma, grade 1, with negative margins (1/1 sentinel node positive)  (a) reclassified as stage IIA in the 2018 prognostic revision   (2) left reduction mammoplasty 11/28/2015 unexpectedly found a pT1c pNX invasive ductal carcinoma, grade 1, estrogen and progesterone receptor positive, HER-2 not amplified, with an MIB-1 of 3%  (3) Mammaprint from the right sided breast cancer was "low risk", predicting a 98% chance of no disease recurrence within 5 years with anti-estrogens as the only systemic therapy. It also predicts minimal to no benefit from chemotherapy  (4) status post bilateral mastectomies with bilateral  sentinel lymph node sampling 12/26/2015, showing  (a) on the right, no residual carcinoma (0/3 nodes involved)  (b) on the left, focal ductal carcinoma in situ, 0.2 cm, with negative margins; (0/4 nodes involved)  (5) adjuvant radiation 02/11/16 - 03/27/16    1) Right chest wall: 50.4 Gy in 28 fractions               2) Right chest wall boost: 10 Gy in 5 fractions   (6) started on tamoxifen neoadjuvantly 10/23/2015 to allow for expected delays in definitive local treatment  (a) Mirena IUD in place  (b) tamoxifen discontinued 08/13/2017, with progression  (7) since she had 4 lymph nodes removed from each axilla but also receive radiation on the right, lab draws should preferentially be obtained from the left arm   METASTATIC DISEASE: April 2019 (8) CT scan of the abdomen and pelvis 08/04/2017, MRI of the liver and total spine MRI 08/11/2017 and CT scan of the chest and brain on 08/13/2017 showed liver and bone metastases  (a) liver biopsy 09/07/2017 confirms estrogen receptor positive, progesterone receptor negative, HER-2 negative metastatic breast cancer  (b) chest CT scan 08/13/2017 shows multiple bilateral pulmonary nodules.  (c) CT of the brain with and without contrast 08/26/2017 shows no evidence of metastatic disease to the brain  (d) CA 27-29 is informative  (9) fulvestrant started 08/17/2017  (a) palbociclib started 08/31/2017  (b) fulvestrant and palbociclib discontinued 01/04/2018 with evidence of progression in the liver  (10) denosumab/Xgeva started 09/14/2017  (a) held after 03/01/2018 dose because of an erosion felt to be secondary to dentures.  (11) anastrozole started 01/04/2018  (a) everolimus started 01/29/2018  (b) liver MRI and bone scan December 2019 show stable disease  (12) 01/28/18 prophylactic left femur intramedullary nail surgery for impending left femur pathologic fracture  (a) palliative radiation with Dr. [name] to the left femur and left ilium from  02/16/2018 to 03/01/2018  (13) [facility] report requested on liver biopsy from April 2019, sent 06/09/2018   PLAN: Kimbella will soon be 1 year out from definitive diagnosis of metastatic recurrence.  Clinically she is doing very well, working 60-hour weeks.  The big problem she has of course is neuropathy.  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  She has pretty much maxed out on the gabapentin dose, without significant relief.  I asked her to switch to pregabalin, and she will take her first dose tonight.  I am starting her at 100 mg twice daily but we can easily double the dose if she tolerates the lower dose well.  Perhaps this will work a little bit better for her.  I also wrote her for lorazepam to take at bedtime no more frequently than twice a week.  Hopefully this will help her sleep a few more nights during the week.  Otherwise  it is very gratifying to see the drop in her tumor marker.  That was repeated again today.  We are resuming the denosumab/Xgeva today.  She has seen her oral surgeon, she has a very superficial erosion which is really due to her dentures.  That has been adjusted.  She tells me she felt much better as far as her bones were concerned when she was getting the shots.  She will let me know if there is any issue with these doses.  Before she sees me again late March she will have a repeat CT scan of the chest and a bone scan  I am going to obtain a [facility] report on her liver biopsy from last April  She knows to call for any other issue that may develop before the next visit.   Maritssa Haughton, Virgie Dad, MD  06/09/18 9:54 AM Medical Oncology and Hematology [facility] [address] [place], [place] [address] Tel. [phone]    Fax. [phone]   I, Wilburn Mylar, am acting as scribe for Dr. Virgie Dad. Jakobe Blau.  I, Lurline Del MD, have reviewed the above documentation for accuracy and  completeness, and I agree with the above.

## 2018-06-09 ENCOUNTER — Inpatient Hospital Stay: Payer: 59

## 2018-06-09 ENCOUNTER — Telehealth: Payer: Self-pay | Admitting: Oncology

## 2018-06-09 ENCOUNTER — Inpatient Hospital Stay (HOSPITAL_BASED_OUTPATIENT_CLINIC_OR_DEPARTMENT_OTHER): Payer: 59 | Admitting: Oncology

## 2018-06-09 ENCOUNTER — Inpatient Hospital Stay: Payer: 59 | Attending: Oncology

## 2018-06-09 VITALS — BP 146/95 | Temp 98.5°F | Resp 18 | Ht 66.5 in | Wt 266.1 lb

## 2018-06-09 DIAGNOSIS — C50211 Malignant neoplasm of upper-inner quadrant of right female breast: Secondary | ICD-10-CM

## 2018-06-09 DIAGNOSIS — Z975 Presence of (intrauterine) contraceptive device: Secondary | ICD-10-CM | POA: Diagnosis not present

## 2018-06-09 DIAGNOSIS — Z8049 Family history of malignant neoplasm of other genital organs: Secondary | ICD-10-CM | POA: Insufficient documentation

## 2018-06-09 DIAGNOSIS — C787 Secondary malignant neoplasm of liver and intrahepatic bile duct: Secondary | ICD-10-CM | POA: Insufficient documentation

## 2018-06-09 DIAGNOSIS — Z79810 Long term (current) use of selective estrogen receptor modulators (SERMs): Secondary | ICD-10-CM

## 2018-06-09 DIAGNOSIS — Z800 Family history of malignant neoplasm of digestive organs: Secondary | ICD-10-CM | POA: Diagnosis not present

## 2018-06-09 DIAGNOSIS — C50012 Malignant neoplasm of nipple and areola, left female breast: Secondary | ICD-10-CM

## 2018-06-09 DIAGNOSIS — Z9013 Acquired absence of bilateral breasts and nipples: Secondary | ICD-10-CM | POA: Insufficient documentation

## 2018-06-09 DIAGNOSIS — Z170 Estrogen receptor positive status [ER+]: Secondary | ICD-10-CM

## 2018-06-09 DIAGNOSIS — C7951 Secondary malignant neoplasm of bone: Secondary | ICD-10-CM | POA: Insufficient documentation

## 2018-06-09 DIAGNOSIS — Z0000 Encounter for general adult medical examination without abnormal findings: Secondary | ICD-10-CM | POA: Diagnosis not present

## 2018-06-09 DIAGNOSIS — Z9851 Tubal ligation status: Secondary | ICD-10-CM | POA: Insufficient documentation

## 2018-06-09 DIAGNOSIS — Z8042 Family history of malignant neoplasm of prostate: Secondary | ICD-10-CM | POA: Diagnosis not present

## 2018-06-09 DIAGNOSIS — C50011 Malignant neoplasm of nipple and areola, right female breast: Secondary | ICD-10-CM

## 2018-06-09 DIAGNOSIS — C50112 Malignant neoplasm of central portion of left female breast: Secondary | ICD-10-CM

## 2018-06-09 DIAGNOSIS — Z79899 Other long term (current) drug therapy: Secondary | ICD-10-CM

## 2018-06-09 DIAGNOSIS — G893 Neoplasm related pain (acute) (chronic): Secondary | ICD-10-CM

## 2018-06-09 LAB — CBC WITH DIFFERENTIAL/PLATELET
Abs Immature Granulocytes: 0.05 10*3/uL (ref 0.00–0.07)
Basophils Absolute: 0 10*3/uL (ref 0.0–0.1)
Basophils Relative: 1 %
Eosinophils Absolute: 0.1 10*3/uL (ref 0.0–0.5)
Eosinophils Relative: 3 %
HCT: 34.4 % — ABNORMAL LOW (ref 36.0–46.0)
Hemoglobin: 11.4 g/dL — ABNORMAL LOW (ref 12.0–15.0)
Immature Granulocytes: 1 %
Lymphocytes Relative: 11 %
Lymphs Abs: 0.4 10*3/uL — ABNORMAL LOW (ref 0.7–4.0)
MCH: 28.9 pg (ref 26.0–34.0)
MCHC: 33.1 g/dL (ref 30.0–36.0)
MCV: 87.3 fL (ref 80.0–100.0)
Monocytes Absolute: 0.4 10*3/uL (ref 0.1–1.0)
Monocytes Relative: 10 %
Neutro Abs: 3 10*3/uL (ref 1.7–7.7)
Neutrophils Relative %: 74 %
Platelets: 291 10*3/uL (ref 150–400)
RBC: 3.94 MIL/uL (ref 3.87–5.11)
RDW: 14.4 % (ref 11.5–15.5)
WBC: 4.1 10*3/uL (ref 4.0–10.5)
nRBC: 0 % (ref 0.0–0.2)

## 2018-06-09 LAB — COMPREHENSIVE METABOLIC PANEL
ALT: 30 U/L (ref 0–44)
AST: 23 U/L (ref 15–41)
Albumin: 3.2 g/dL — ABNORMAL LOW (ref 3.5–5.0)
Alkaline Phosphatase: 147 U/L — ABNORMAL HIGH (ref 38–126)
Anion gap: 9 (ref 5–15)
BUN: 11 mg/dL (ref 6–20)
CO2: 27 mmol/L (ref 22–32)
Calcium: 9.3 mg/dL (ref 8.9–10.3)
Chloride: 105 mmol/L (ref 98–111)
Creatinine, Ser: 0.85 mg/dL (ref 0.44–1.00)
GFR calc Af Amer: >60 mL/min (ref >60)
GFR calc non Af Amer: >60 mL/min (ref >60)
Glucose, Bld: 108 mg/dL — ABNORMAL HIGH (ref 70–99)
Potassium: 3.5 mmol/L (ref 3.5–5.1)
Sodium: 141 mmol/L (ref 135–145)
Total Bilirubin: 0.3 mg/dL (ref 0.3–1.2)
Total Protein: 7.1 g/dL (ref 6.5–8.1)

## 2018-06-09 MED ORDER — DENOSUMAB 120 MG/1.7ML ~~LOC~~ SOLN
120.0000 mg | Freq: Once | SUBCUTANEOUS | Status: AC
  Administered 2018-06-09: 120 mg via SUBCUTANEOUS

## 2018-06-09 MED ORDER — DENOSUMAB 120 MG/1.7ML ~~LOC~~ SOLN
SUBCUTANEOUS | Status: AC
  Filled 2018-06-09: qty 1.7

## 2018-06-09 MED ORDER — LORAZEPAM 0.5 MG PO TABS: 0.5000 mg | ORAL_TABLET | Freq: Every evening | ORAL | 0 refills | Status: DC | PRN

## 2018-06-09 MED ORDER — PREGABALIN 100 MG PO CAPS: 100.0000 mg | ORAL_CAPSULE | Freq: Two times a day (BID) | ORAL | 6 refills | Status: DC

## 2018-06-09 MED ORDER — DEXAMETHASONE 0.5 MG/5ML PO SOLN: ORAL | 4 refills | Status: DC

## 2018-06-09 MED ORDER — EVEROLIMUS 10 MG PO TABS: 10.0000 mg | ORAL_TABLET | Freq: Every day | ORAL | 6 refills | Status: DC

## 2018-06-09 MED ORDER — ANASTROZOLE 1 MG PO TABS: 1.0000 mg | ORAL_TABLET | Freq: Every day | ORAL | 4 refills | Status: DC

## 2018-06-09 MED FILL — DEXAMETHASONE 0.5 MG/5 ML L: 0.5 | 13 days supply | Qty: 500 | Fill #0

## 2018-06-09 MED FILL — LORazepam 0.5 MG TABS: 0.5 | 30 days supply | Qty: 30 | Fill #0

## 2018-06-09 MED FILL — PREGABALIN 100 MG CAPS: 100 | 30 days supply | Qty: 60 | Fill #0

## 2018-06-09 NOTE — Telephone Encounter (Signed)
Gave avs and calendar ° °

## 2018-06-10 LAB — CANCER ANTIGEN 27.29: CA 27.29: 53.2 U/mL — ABNORMAL HIGH (ref 0.0–38.6)

## 2018-06-16 ENCOUNTER — Telehealth: Payer: Self-pay | Admitting: *Deleted

## 2018-06-16 NOTE — Telephone Encounter (Signed)
This RN spoke with pt per her call wanting to let Dr Jannifer Rodney know of benefit with new medications.  Debbie [name] states the Lyrica " works good for at least 5-8 hours but then wears off "  She is taking 100 mg bid.  Debbie [name] also states the ativan prescribed for anxiety " doesn't affect me at all - no noted benefit ".  Return call number given as [phone]  This call will be reviewed with MD for further recommendations and nurse will contact pt.

## 2018-06-29 ENCOUNTER — Telehealth: Payer: Self-pay

## 2018-06-29 DIAGNOSIS — C787 Secondary malignant neoplasm of liver and intrahepatic bile duct: Secondary | ICD-10-CM | POA: Diagnosis not present

## 2018-06-29 DIAGNOSIS — Z170 Estrogen receptor positive status [ER+]: Secondary | ICD-10-CM | POA: Diagnosis not present

## 2018-06-29 DIAGNOSIS — C7951 Secondary malignant neoplasm of bone: Secondary | ICD-10-CM | POA: Diagnosis not present

## 2018-06-29 DIAGNOSIS — C50211 Malignant neoplasm of upper-inner quadrant of right female breast: Secondary | ICD-10-CM | POA: Diagnosis not present

## 2018-06-29 DIAGNOSIS — C7800 Secondary malignant neoplasm of unspecified lung: Secondary | ICD-10-CM | POA: Diagnosis not present

## 2018-06-29 MED ORDER — PREGABALIN 100 MG PO CAPS: 100.0000 mg | ORAL_CAPSULE | Freq: Three times a day (TID) | ORAL | 2 refills | Status: DC

## 2018-06-29 MED FILL — CHLORHEXIDINE 0.12% RINSE: 0.12 | 15 days supply | Qty: 473 | Fill #0

## 2018-06-29 MED FILL — CLINDAMYCIN HCL 150 MG CAPS: 150 | 7 days supply | Qty: 28 | Fill #0

## 2018-06-29 MED FILL — PREGABALIN 100 MG CAPS: 100 | 30 days supply | Qty: 90 | Fill #0

## 2018-06-29 MED FILL — AFINITOR 10 MG TABLET: 10 | 28 days supply | Qty: 28 | Fill #6

## 2018-06-29 MED FILL — ANASTROZOLE 1 MG TABLET: 1 | 90 days supply | Qty: 90 | Fill #2

## 2018-06-29 NOTE — Telephone Encounter (Signed)
Pt called requesting refill on Lyrica.  Nurse reviewed chart, Rx request early.  Pt stated that she was informed to increase Lyrics to TID by another nurse in early February.  Pt reports that Lyrica TID is really beneficial to controlling her pain.  She has also started to utilize a Tens Unit that she states it helping control pain as well.     Nurse reviewed with Dr. [name] - Ok to refill Lyrica 100mg  TID.

## 2018-07-05 MED FILL — LOSARTAN POTASSIUM 100 MG T: 100 | 30 days supply | Qty: 30 | Fill #0

## 2018-07-07 ENCOUNTER — Inpatient Hospital Stay: Payer: 59

## 2018-07-07 ENCOUNTER — Inpatient Hospital Stay: Payer: 59 | Attending: Oncology

## 2018-07-07 VITALS — BP 141/62 | HR 90 | Temp 98.3°F

## 2018-07-07 DIAGNOSIS — C50211 Malignant neoplasm of upper-inner quadrant of right female breast: Secondary | ICD-10-CM

## 2018-07-07 DIAGNOSIS — C50012 Malignant neoplasm of nipple and areola, left female breast: Secondary | ICD-10-CM

## 2018-07-07 DIAGNOSIS — C50112 Malignant neoplasm of central portion of left female breast: Secondary | ICD-10-CM

## 2018-07-07 DIAGNOSIS — C50011 Malignant neoplasm of nipple and areola, right female breast: Secondary | ICD-10-CM

## 2018-07-07 DIAGNOSIS — Z170 Estrogen receptor positive status [ER+]: Secondary | ICD-10-CM

## 2018-07-07 DIAGNOSIS — C7951 Secondary malignant neoplasm of bone: Secondary | ICD-10-CM | POA: Diagnosis not present

## 2018-07-07 DIAGNOSIS — C787 Secondary malignant neoplasm of liver and intrahepatic bile duct: Secondary | ICD-10-CM

## 2018-07-07 LAB — COMPREHENSIVE METABOLIC PANEL
ALT: 28 U/L (ref 0–44)
AST: 27 U/L (ref 15–41)
Albumin: 3.2 g/dL — ABNORMAL LOW (ref 3.5–5.0)
Alkaline Phosphatase: 132 U/L — ABNORMAL HIGH (ref 38–126)
Anion gap: 13 (ref 5–15)
BUN: 9 mg/dL (ref 6–20)
CO2: 26 mmol/L (ref 22–32)
Calcium: 9.2 mg/dL (ref 8.9–10.3)
Chloride: 105 mmol/L (ref 98–111)
Creatinine, Ser: 0.97 mg/dL (ref 0.44–1.00)
GFR calc Af Amer: >60 mL/min (ref >60)
GFR calc non Af Amer: >60 mL/min (ref >60)
Glucose, Bld: 160 mg/dL — ABNORMAL HIGH (ref 70–99)
Potassium: 3.7 mmol/L (ref 3.5–5.1)
Sodium: 144 mmol/L (ref 135–145)
Total Bilirubin: 0.3 mg/dL (ref 0.3–1.2)
Total Protein: 7.1 g/dL (ref 6.5–8.1)

## 2018-07-07 LAB — CBC WITH DIFFERENTIAL/PLATELET
Abs Immature Granulocytes: 0.02 10*3/uL (ref 0.00–0.07)
Basophils Absolute: 0 10*3/uL (ref 0.0–0.1)
Basophils Relative: 1 %
Eosinophils Absolute: 0.2 10*3/uL (ref 0.0–0.5)
Eosinophils Relative: 5 %
HCT: 35.3 % — ABNORMAL LOW (ref 36.0–46.0)
Hemoglobin: 11.4 g/dL — ABNORMAL LOW (ref 12.0–15.0)
Immature Granulocytes: 1 %
Lymphocytes Relative: 10 %
Lymphs Abs: 0.4 10*3/uL — ABNORMAL LOW (ref 0.7–4.0)
MCH: 28.8 pg (ref 26.0–34.0)
MCHC: 32.3 g/dL (ref 30.0–36.0)
MCV: 89.1 fL (ref 80.0–100.0)
Monocytes Absolute: 0.3 10*3/uL (ref 0.1–1.0)
Monocytes Relative: 8 %
Neutro Abs: 3.2 10*3/uL (ref 1.7–7.7)
Neutrophils Relative %: 75 %
Platelets: 284 10*3/uL (ref 150–400)
RBC: 3.96 MIL/uL (ref 3.87–5.11)
RDW: 14.5 % (ref 11.5–15.5)
WBC: 4.1 10*3/uL (ref 4.0–10.5)
nRBC: 0 % (ref 0.0–0.2)

## 2018-07-07 MED ORDER — DENOSUMAB 120 MG/1.7ML ~~LOC~~ SOLN
SUBCUTANEOUS | Status: AC
  Filled 2018-07-07: qty 1.7

## 2018-07-07 MED ORDER — DENOSUMAB 120 MG/1.7ML ~~LOC~~ SOLN
120.0000 mg | Freq: Once | SUBCUTANEOUS | Status: AC
  Administered 2018-07-07: 120 mg via SUBCUTANEOUS

## 2018-07-08 LAB — CANCER ANTIGEN 27.29: CA 27.29: 48.2 U/mL — ABNORMAL HIGH (ref 0.0–38.6)

## 2018-07-19 MED FILL — TRIAMTERENE/HCTZ 37.5/25 CP: 37.5-25 | 90 days supply | Qty: 90 | Fill #1

## 2018-07-20 ENCOUNTER — Other Ambulatory Visit: Payer: Self-pay | Admitting: Oncology

## 2018-07-20 DIAGNOSIS — C50211 Malignant neoplasm of upper-inner quadrant of right female breast: Secondary | ICD-10-CM

## 2018-07-20 DIAGNOSIS — Z170 Estrogen receptor positive status [ER+]: Secondary | ICD-10-CM

## 2018-07-26 MED FILL — AFINITOR 10 MG TABLET: 10 | 28 days supply | Qty: 28 | Fill #0

## 2018-07-26 MED FILL — DEXAMETHASONE 0.5 MG/5 ML L: 0.5 | 13 days supply | Qty: 500 | Fill #0

## 2018-07-28 MED FILL — LOSARTAN POTASSIUM 100 MG T: 100 | 30 days supply | Qty: 30 | Fill #1

## 2018-07-28 MED FILL — LEVOTHYROXINE 137 MCG TAB: 137 | 90 days supply | Qty: 90 | Fill #1

## 2018-07-28 MED FILL — PREGABALIN 100 MG CAPS: 100 | 30 days supply | Qty: 90 | Fill #1

## 2018-07-29 ENCOUNTER — Other Ambulatory Visit: Payer: Self-pay | Admitting: Oncology

## 2018-08-02 ENCOUNTER — Encounter (HOSPITAL_COMMUNITY): Payer: 59

## 2018-08-02 ENCOUNTER — Other Ambulatory Visit: Payer: Self-pay

## 2018-08-02 ENCOUNTER — Encounter (HOSPITAL_COMMUNITY): Payer: Self-pay

## 2018-08-02 ENCOUNTER — Ambulatory Visit (HOSPITAL_COMMUNITY)
Admission: RE | Admit: 2018-08-02 | Discharge: 2018-08-02 | Disposition: A | Discharge status: Home / Self Care | Payer: 59 | Source: Ambulatory Visit | Attending: Oncology | Admitting: Oncology

## 2018-08-02 DIAGNOSIS — C50012 Malignant neoplasm of nipple and areola, left female breast: Secondary | ICD-10-CM | POA: Diagnosis not present

## 2018-08-02 DIAGNOSIS — C50011 Malignant neoplasm of nipple and areola, right female breast: Secondary | ICD-10-CM | POA: Diagnosis not present

## 2018-08-02 DIAGNOSIS — C50211 Malignant neoplasm of upper-inner quadrant of right female breast: Secondary | ICD-10-CM | POA: Diagnosis not present

## 2018-08-02 DIAGNOSIS — G893 Neoplasm related pain (acute) (chronic): Secondary | ICD-10-CM | POA: Insufficient documentation

## 2018-08-02 DIAGNOSIS — Z853 Personal history of malignant neoplasm of breast: Secondary | ICD-10-CM | POA: Diagnosis not present

## 2018-08-02 DIAGNOSIS — Z170 Estrogen receptor positive status [ER+]: Secondary | ICD-10-CM | POA: Diagnosis not present

## 2018-08-02 DIAGNOSIS — C7951 Secondary malignant neoplasm of bone: Secondary | ICD-10-CM | POA: Diagnosis not present

## 2018-08-02 DIAGNOSIS — C787 Secondary malignant neoplasm of liver and intrahepatic bile duct: Secondary | ICD-10-CM | POA: Insufficient documentation

## 2018-08-02 DIAGNOSIS — C50112 Malignant neoplasm of central portion of left female breast: Secondary | ICD-10-CM | POA: Diagnosis not present

## 2018-08-02 DIAGNOSIS — R911 Solitary pulmonary nodule: Secondary | ICD-10-CM | POA: Diagnosis not present

## 2018-08-02 MED ORDER — SODIUM CHLORIDE (PF) 0.9 % IJ SOLN
INTRAMUSCULAR | Status: AC
  Filled 2018-08-02: qty 50

## 2018-08-02 MED ORDER — IOHEXOL 300 MG/ML  SOLN
75.0000 mL | Freq: Once | INTRAMUSCULAR | Status: AC | PRN
  Administered 2018-08-02: 75 mL via INTRAVENOUS

## 2018-08-02 NOTE — Progress Notes (Signed)
[place]  [phone] [phone]    ID: MASEN SALVAS DOB: 1965/11/28  MR#: [id number]  ZHY#:[id number]  Patient Care Team: Kelton Pillar, MD as PCP - General (Family Medicine) Jacelyn Pi, MD as Consulting Physician (Endocrinology) Leo Grosser Seymour Bars, MD as Consulting Physician (Obstetrics and Gynecology) Jovita Kussmaul, MD as Consulting Physician (General Surgery) Tationa Stech, Virgie Dad, MD as Consulting Physician (Oncology) [name], MD as Attending Physician (Radiation Oncology) Belva Crome, MD as Consulting Physician (Cardiology) Crissie Reese, MD as Consulting Physician (Plastic Surgery) [name], [name], NP as Nurse Practitioner (Hematology and Oncology) Margarette Canada (Inactive) OTHER MD:   CHIEF COMPLAINT: Estrogen receptor positive stage IV  breast cancer  CURRENT TREATMENT:  Anastrozole, affinitor; denosumab/Xgeva   INTERVAL HISTORY: Makaylie returns today for follow-up and treatment of her estrogen receptor positive stage IV breast cancer.   She continues on anastrozole.  She tolerates this well without unusual side effects in particular without significant hot flashes or vaginal dryness problems  She also continues on affinitor.  She is using the dexamethasone mouth rinse.  She has not had any problems with dry cough or shortness of breath and has had no mouth sores.  She also continues on denosumab/Xgeva every 28 days, with a dose due today.  Her osteonecrosis area is closely followed by Dr. Romie Minus.  The patient feels this is improving, although of course we are continuing the denosumab  [facility]'s last bone density screening on 01/09/2016, showed a T-score of 0.7, which is considered normal.    Since her last visit here, she underwent a chest CT with contrast on 08/02/2018 showing stable results in the bone, with the measurable disease in the lungs no longer apparent, and with no obvious progression in the  liver.  She is scheduled for a whole body bone scan on 09/13/2018.   REVIEW OF SYSTEMS: Shequita complains of pain and swelling in both lower extremities, left greater than right.  She is going to a pain clinic in [facility] and they treated her with TENS units which she found slightly helpful.  She is taking Lyrica now twice a day.  She thinks it is minimally helpful.  In addition to pain and swelling and some discoloration she has some tingling and numbness in both legs which is very intermittent.  She denies unusual headaches, visual changes, nausea, vomiting, fever, rash, or bleeding.  A detailed review of systems today was otherwise stable  PAST MEDICAL HISTORY: Past Medical History:  Diagnosis Date  . Anxiety   . Arthritis   . Bone metastasis ([place])   . Breast cancer of upper-inner quadrant of right female breast [facility]) oncologist-  dr [name]--- 04/ 2019 ER/PR+ Stage IV w/ metastatic disease to liver, total spine, lung, bone   dx 06/ 2017  right breast invasive ductal carcinoma, Stage IIB, ER/PR + (pT2 pN1), grade 1-- s/p  right breast lumpecotmy w/ sln bx's and bilateral breast reduction 11-28-2015/  left breast with reduction , dx invasive ductal ca, Grade 1 (pT1cpNX) ER/PR positive/  adjuvant radiation completed 03-27-2016 to right chest wall, 2018  reclassifiec Stage IIA  . Cancer, metastatic to liver ([place])   . Chronic back pain   . Constipation   . Depression   . Family history of adverse reaction to anesthesia     mother has Copd was kept on vent   . Headache   . Heart murmur   . History of hyperthyroidism    s/p  RAI  .  History of radiation therapy 02/11/16- 03/27/16   Right Chest Wall 50.4 Gy in 28 fractions, Right Chest Wall Boost 10 Gy in 5 fractions.   . Hyperlipidemia   . Hypertension   . Hypothyroidism, postradioiodine therapy    endocrinoloigst-  dr balan--  2004  s/p  RAI  . Menorrhagia 2011  . Multiple pulmonary nodules    secondary  metastatic  . PONV  (postoperative nausea and vomiting)    patch helps   . SUI (stress urinary incontinence, female)   . Wears contact lenses   . Wears dentures    full upper and lower partial    PAST SURGICAL HISTORY: Past Surgical History:  Procedure Laterality Date  . BREAST LUMPECTOMY WITH NEEDLE LOCALIZATION AND AXILLARY SENTINEL LYMPH NODE BX Right 11/28/2015   Procedure: RIGHT BREAST LUMPECTOMY WITH NEEDLE LOCALIZATION AND AXILLARY SENTINEL LYMPH NODE BX;  Surgeon: Autumn Messing III, MD;  Location: [place];  Service: General;  Laterality: Right;  . BREAST REDUCTION SURGERY Bilateral 11/28/2015   Procedure: MAMMARY REDUCTION  (BREAST) BILATERAL;  Surgeon: Crissie Reese, MD;  Location: [place];  Service: Plastics;  Laterality: Bilateral;  . CESAREAN SECTION  01/12/1993  . COLONOSCOPY    . Dental surgeries    . FEMUR IM NAIL Left 01/28/2018   Procedure: INTRAMEDULLARY (IM) NAIL FEMORAL;  Surgeon: Nicholes Stairs, MD;  Location: [place];  Service: Orthopedics;  Laterality: Left;  . HYSTEROSCOPY N/A 09/02/2017   Procedure: HYSTEROSCOPY  to remove IUD;  Surgeon: Eldred Manges, MD;  Location: [facility];  Service: Gynecology;  Laterality: N/A;  . Liver biospy    . MASTECTOMY    . MASTECTOMY W/ SENTINEL NODE BIOPSY Bilateral 12/26/2015   Procedure: BILATERAL MASTECTOMY WITH LEFT SENTINEL LYMPH NODE BIOPSY;  Surgeon: Autumn Messing III, MD;  Location: [place];  Service: General;  Laterality: Bilateral;  . Removal of IUD    . TUBAL LIGATION Bilateral 1995    FAMILY HISTORY: Family History  Problem Relation Age of Onset  . Diabetes Mother   . Hypertension Mother   . Hypertension Father   . Diabetes Maternal Grandmother   . Hypertension Sister   . Cancer Paternal Grandmother        colon  . Colon cancer Paternal Grandmother   . Esophageal cancer Neg Hx   . Stomach cancer Neg Hx   . Rectal cancer Neg Hx    The patient's parents are still living, in their early 26s as of June 2017. The patient  has one brother, 2 sisters. On the maternal side there is a history of uterine and prostate cancer. On the paternal side there is a history of colon cancer and possibly uterine cancer. There is no history of breast or ovarian cancer in the family.    GYNECOLOGIC HISTORY:  No LMP recorded. (Menstrual status: IUD).  Menarche age 54. She still having regular periods. She is GX P1, first pregnancy age 70. She used oral contraceptives for a few years remotely, with no complications. She is currently on a Mirena IUD and also is status post bilateral tubal ligation.   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 Her husband [name] is a Administrator.  They are both taking appropriate precautions at work.  Their son [name] is a radio DJ.  He no longer lives with the patient.  The patient has no grandchildren. She is not a Ambulance person.   ADVANCED DIRECTIVES: Not in  place   HEALTH MAINTENANCE: Social History   Tobacco Use  . Smoking status: Never Smoker  . Smokeless tobacco: Never Used  Substance Use Topics  . Alcohol use: No  . Drug use: No     Colonoscopy: January 2016   PAP: January 2016   Bone density: August 2017 showed a T score of +0.7 normal  Lipid panel:  Allergies  Allergen Reactions  . Ace Inhibitors Cough  . Atorvastatin Other (See Comments)    Joint pain  . Simvastatin Other (See Comments)    Joint pain    Current Outpatient Medications  Medication Sig Dispense Refill  . acetaminophen (TYLENOL) 325 MG tablet Take 325 mg by mouth every 6 (six) hours as needed for mild pain or headache.     Lenon Oms 10 MG tablet TAKE 1 TABLET BY MOUTH DAILY. 28 tablet 2  . anastrozole (ARIMIDEX) 1 MG tablet Take 1 tablet (1 mg total) by mouth daily. 90 tablet 4  . Calcium-Magnesium 250-125 MG TABS Take 1 tablet by mouth daily. 120 each 4  . cetirizine (ZYRTEC ALLERGY) 10 MG tablet Take 10 mg by mouth daily as needed for allergies.      Marland Kitchen dexamethasone (DECADRON) 0.5 MG/5ML solution Swish 47m in mouth for 289m and spit out. Use 4 times daily for 8 weeks, start with Afinitor. Avoid eating/drinking for 1hr after rinse. 500 mL 4  . diclofenac sodium (VOLTAREN) 1 % GEL Apply 4 g topically 4 (four) times daily. 2 Tube 2  . docusate sodium (COLACE) 100 MG capsule Take 200-400 mg by mouth daily.    . Ferrous Sulfate (IRON) 325 (65 Fe) MG TABS Take 1 tablet (325 mg total) by mouth daily. 30 each 2  . hydrocortisone cream 1 % Apply 1 application topically daily as needed (for rash).    . Marland Kitchenbuprofen (ADVIL,MOTRIN) 600 MG tablet Take 1 tablet (600 mg total) by mouth every 6 (six) hours as needed for moderate pain. 60 tablet 0  . levothyroxine (SYNTHROID, LEVOTHROID) 137 MCG tablet Take 137 mcg by mouth daily before breakfast.     . LIVALO 2 MG TABS Take 2 mg by mouth daily.   6  . LORazepam (ATIVAN) 0.5 MG tablet Take 1 tablet (0.5 mg total) by mouth at bedtime as needed for anxiety. 30 tablet 0  . losartan (COZAAR) 100 MG tablet Take 100 mg by mouth every morning.     . Multiple Vitamin (MULTIVITAMIN) tablet Take 1 tablet by mouth daily.    . Marland Kitchenmega-3 acid ethyl esters (LOVAZA) 1 g capsule Take 1 g by mouth 2 (two) times daily.     . potassium chloride SA (K-DUR,KLOR-CON) 20 MEQ tablet Take 40 mEq by mouth daily.     . pregabalin (LYRICA) 100 MG capsule Take 1 capsule (100 mg total) by mouth 3 (three) times daily. 90 capsule 2  . prochlorperazine (COMPAZINE) 10 MG tablet Take 1 tablet (10 mg total) by mouth every 6 (six) hours as needed for nausea or vomiting. (Patient not taking: Reported on 04/01/2018) 30 tablet 0  . triamterene-hydrochlorothiazide (DYAZIDE) 37.5-25 MG capsule Take 1 each (1 capsule total) by mouth daily. 90 capsule 4  . Vitamin D, Cholecalciferol, 1000 units CAPS Take 1 tablet by mouth daily. (Patient taking differently: Take 1,000 Units by mouth daily. ) 90 capsule 4   No current facility-administered medications for  this visit.     OBJECTIVE: Middle-aged African-American woman in no acute distress Vitals:   08/04/18 0820  BP: (!) 155/83  Pulse: (!) 105  Resp: 18  Temp: 98.5 F (36.9 C)     Body mass index is 42.53 kg/m.    ECOG FS:1 - Symptomatic but completely ambulatory  Filed Weights   08/04/18 0820  Weight: 267 lb 8 oz (121.3 kg)   Sclerae unicteric, pupils round and equal Oropharynx full upper plate No cervical or supraclavicular adenopathy Lungs no rales or rhonchi Heart regular rate and rhythm Abd soft, nontender, positive bowel sounds MSK no focal spinal tenderness, no upper extremity lymphedema Neuro: nonfocal, well oriented, appropriate affect Breasts: The patient is status post bilateral mastectomies.    LAB RESULTS:  CMP     Component Value Date/Time   NA 144 07/07/2018 0823   NA 140 07/08/2016 0948   K 3.7 07/07/2018 0823   K 4.1 07/08/2016 0948   CL 105 07/07/2018 0823   CO2 26 07/07/2018 0823   CO2 24 07/08/2016 0948   GLUCOSE 160 (H) 07/07/2018 0823   GLUCOSE 89 07/08/2016 0948   BUN 9 07/07/2018 0823   BUN 11.9 07/08/2016 0948   CREATININE 0.97 07/07/2018 0823   CREATININE 0.90 06/22/2017 1147   CREATININE 0.9 07/08/2016 0948   CALCIUM 9.2 07/07/2018 0823   CALCIUM 10.0 07/08/2016 0948   PROT 7.1 07/07/2018 0823   PROT 7.6 07/08/2016 0948   ALBUMIN 3.2 (L) 07/07/2018 0823   ALBUMIN 3.6 07/08/2016 0948   AST 27 07/07/2018 0823   AST 31 06/22/2017 1147   AST 22 07/08/2016 0948   ALT 28 07/07/2018 0823   ALT 45 06/22/2017 1147   ALT 27 07/08/2016 0948   ALKPHOS 132 (H) 07/07/2018 0823   ALKPHOS 115 07/08/2016 0948   BILITOT 0.3 07/07/2018 0823   BILITOT 0.4 06/22/2017 1147   BILITOT 0.35 07/08/2016 0948   GFRNONAA >60 07/07/2018 0823   GFRNONAA >60 06/22/2017 1147   GFRAA >60 07/07/2018 0823   GFRAA >60 06/22/2017 1147    INo results found for: SPEP, UPEP  Lab Results  Component Value Date   WBC 4.0 08/04/2018   NEUTROABS 3.1 08/04/2018    HGB 11.9 (L) 08/04/2018   HCT 37.2 08/04/2018   MCV 89.2 08/04/2018   PLT 312 08/04/2018      Chemistry      Component Value Date/Time   NA 144 07/07/2018 0823   NA 140 07/08/2016 0948   K 3.7 07/07/2018 0823   K 4.1 07/08/2016 0948   CL 105 07/07/2018 0823   CO2 26 07/07/2018 0823   CO2 24 07/08/2016 0948   BUN 9 07/07/2018 0823   BUN 11.9 07/08/2016 0948   CREATININE 0.97 07/07/2018 0823   CREATININE 0.90 06/22/2017 1147   CREATININE 0.9 07/08/2016 0948      Component Value Date/Time   CALCIUM 9.2 07/07/2018 0823   CALCIUM 10.0 07/08/2016 0948   ALKPHOS 132 (H) 07/07/2018 0823   ALKPHOS 115 07/08/2016 0948   AST 27 07/07/2018 0823   AST 31 06/22/2017 1147   AST 22 07/08/2016 0948   ALT 28 07/07/2018 0823   ALT 45 06/22/2017 1147   ALT 27 07/08/2016 0948   BILITOT 0.3 07/07/2018 0823   BILITOT 0.4 06/22/2017 1147   BILITOT 0.35 07/08/2016 0948       No results found for: LABCA2  No components found for: SAYTK160  No results for input(s): INR in the last 168 hours.  Urinalysis No results found for: COLORURINE, APPEARANCEUR, LABSPEC, [place], GLUCOSEU, HGBUR, BILIRUBINUR, KETONESUR, PROTEINUR, UROBILINOGEN, NITRITE,  LEUKOCYTESUR   STUDIES: Ct Chest W Contrast  Result Date: 08/02/2018 CLINICAL DATA:  History of bilateral breast cancer with metastatic disease and ongoing oral chemotherapy. Restaging. EXAM: CT CHEST WITH CONTRAST TECHNIQUE: Multidetector CT imaging of the chest was performed during intravenous contrast administration. CONTRAST:  79m OMNIPAQUE IOHEXOL 300 MG/ML  SOLN COMPARISON:  08/13/2017 chest CT. FINDINGS: Cardiovascular: Normal heart size. No significant pericardial effusion/thickening. Mildly atherosclerotic nonaneurysmal thoracic aorta. Normal caliber pulmonary arteries. No central pulmonary emboli. Mediastinum/Nodes: No discrete thyroid nodules. Unremarkable esophagus. No pathologically enlarged axillary, mediastinal or hilar lymph nodes.  Lungs/Pleura: No pneumothorax. No pleural effusion. Perifissural anterior left lower lobe 0.9 cm solid pulmonary nodule (series 8/image 78), stable. Small focus of sharply marginated consolidation in the peripheral apical right upper lobe with surrounding patchy ground-glass opacity, unchanged, compatible with postradiation change. Similar minimal patchy sharply marginated subpleural reticulation in the anterior right middle and right upper lobes unchanged, compatible with postradiation change. Previously described 5 mm apical right upper lobe and 5 mm medial right middle lobe solid pulmonary nodules are absent on today's scan. No acute consolidative airspace disease, lung masses or new significant pulmonary nodules. Upper abdomen: Small hiatal hernia. Diffuse hepatic steatosis. Known small liver lesions as seen on 04/12/2018 abdomen MRI are not well assessed on this chest CT study. Musculoskeletal: Widespread mixed lytic and sclerotic osseous lesions throughout the thoracic vertebrae are stable to minimally increased in size since 08/13/2017 chest CT. Representative 1.3 cm lytic T3 osseous lesion (series 7/image 101), previously 1.3 cm, stable. Lytic 1.5 cm T10 vertebral lesion (series 7/image 99), previously 1.2 cm, mildly increased. Stable patchy sclerosis throughout the T12 vertebral body. No new focal osseous lesions in the chest. Stable postsurgical changes from bilateral mastectomy. IMPRESSION: 1. Widespread mixed lytic and sclerotic osseous metastases throughout the thoracic vertebral bodies, stable to minimally increased in size since 08/13/2017 chest CT study. 2. Previously described subcentimeter right pulmonary nodules on 08/13/2017 chest CT are absent on today's scan. Stable left perifissural nodule. No new or enlarging pulmonary nodules. 3. No pathologically enlarged thoracic lymph nodes. 4. Known small liver lesions as seen on 04/12/2018 abdomen MRI are not well assessed on this chest CT study. Aortic  Atherosclerosis (ICD10-I70.0). Electronically Signed   By: JIlona SorrelM.D.   On: 08/02/2018 13:22     ELIGIBLE FOR AVAILABLE RESEARCH PROTOCOL: P[place]AS, but inelegible because of Mirena IUD   ASSESSMENT: 53y.o. [place] woman status post right breast upper inner quadrant biopsy 10/15/2015 for a clinical T2-T3 N0, stage II invasive ductal carcinoma, estrogen and progesterone receptor positive, HER-2 nonamplified, with an Mib-1 of 70%  (1) status post right lumpectomy and sentinel lymph node sampling with oncoplastic breast reduction 11/28/2015 for a pT2 pN1, stage IIB invasive ductal carcinoma, grade 1, with negative margins (1/1 sentinel node positive)  (a) reclassified as stage IIA in the 2018 prognostic revision   (2) left reduction mammoplasty 11/28/2015 unexpectedly found a pT1c pNX invasive ductal carcinoma, grade 1, estrogen and progesterone receptor positive, HER-2 not amplified, with an MIB-1 of 3%  (3) Mammaprint from the right sided breast cancer was "low risk", predicting a 98% chance of no disease recurrence within 5 years with anti-estrogens as the only systemic therapy. It also predicts minimal to no benefit from chemotherapy  (4) status post bilateral mastectomies with bilateral sentinel lymph node sampling 12/26/2015, showing  (a) on the right, no residual carcinoma (0/3 nodes involved)  (b) on the left, focal ductal carcinoma in situ, 0.2 cm, with  negative margins; (0/4 nodes involved)  (5) adjuvant radiation 02/11/16 - 03/27/16    1) Right chest wall: 50.4 Gy in 28 fractions               2) Right chest wall boost: 10 Gy in 5 fractions   (6) started on tamoxifen neoadjuvantly 10/23/2015 to allow for expected delays in definitive local treatment  (a) Mirena IUD in place  (b) tamoxifen discontinued 08/13/2017, with progression  (7) since she had 4 lymph nodes removed from each axilla but also receive radiation on the right, lab draws should preferentially be obtained  from the left arm   METASTATIC DISEASE: April 2019 (8) CT scan of the abdomen and pelvis 08/04/2017, MRI of the liver and total spine MRI 08/11/2017 and CT scan of the chest and brain on 08/13/2017 showed liver and bone metastases  (a) liver biopsy 09/07/2017 confirms estrogen receptor positive, progesterone receptor negative, HER-2 negative metastatic breast cancer  (b) chest CT scan 08/13/2017 shows multiple bilateral pulmonary nodules.  (c) CT of the brain with and without contrast 08/26/2017 shows no evidence of metastatic disease to the brain  (d) CA 27-29 is informative  (9) fulvestrant started 08/17/2017  (a) palbociclib started 08/31/2017  (b) fulvestrant and palbociclib discontinued 01/04/2018 with evidence of progression in the liver  (10) denosumab/Xgeva started 09/14/2017  (a) held after 03/01/2018 dose because of an erosion felt to be secondary to dentures.  (11) anastrozole started 01/04/2018  (a) everolimus started 01/29/2018  (b) liver MRI and bone scan December 2019 show stable disease  (c) chest CT scan on 08/02/2018 shows lung lesions resolved, bone lesions stable  (12) 01/28/18 prophylactic left femur intramedullary nail surgery for impending left femur pathologic fracture  (a) palliative radiation with Dr. [name] to the left femur and left ilium from 02/16/2018 to 03/01/2018  (13) [facility] report on liver biopsy from April 2019 shows a negative PDL 1, stable MSI and proficient mismatch repair status.  The tumor is positive for the androgen receptor, and for PTEN for PI K3 CA mutations.  HER-2/neu was read as equivocal (it was negative by FISH on the pathology report).Marland Kitchen   PLAN: Rasheka is tolerating the anastrozole/Afinitor combination quite well.  She is able to continue to work full-time.  The chest CT scan shows evidence of response.  Accordingly we are continuing on these medications.  She also receives denosumab/Xgeva every 28days.  Her osteonecrosis issues being  followed closely but it certainly seems to be no worse, possibly a bit better, and we are continuing the denosumab as before  Her next staging studies will be a bone scan and liver MRI 09/13/2018.  I am concerned that there may be a clot in the lower extremities and we may not have any indication symptomatically since she has chronic swelling and venous stasis.  I am going to go ahead and set her up for bilateral Doppler ultrasonography today  She knows to call for any other issue that may develop before the next visit here, which will be 10/04/2018.  Alesia Oshields, Virgie Dad, MD  08/04/18 8:43 AM Medical Oncology and Hematology [facility] [address] [place], [place] [address] Tel. [phone]    Fax. [phone]

## 2018-08-04 ENCOUNTER — Ambulatory Visit (HOSPITAL_COMMUNITY)
Admission: RE | Admit: 2018-08-04 | Discharge: 2018-08-04 | Disposition: A | Discharge status: Home / Self Care | Payer: 59 | Source: Ambulatory Visit | Attending: Oncology | Admitting: Oncology

## 2018-08-04 ENCOUNTER — Inpatient Hospital Stay: Payer: 59

## 2018-08-04 ENCOUNTER — Inpatient Hospital Stay: Payer: 59 | Attending: Oncology | Admitting: Oncology

## 2018-08-04 ENCOUNTER — Other Ambulatory Visit: Payer: Self-pay

## 2018-08-04 ENCOUNTER — Telehealth: Payer: Self-pay | Admitting: Oncology

## 2018-08-04 VITALS — BP 155/83 | HR 105 | Temp 98.5°F | Resp 18 | Ht 66.5 in | Wt 267.5 lb

## 2018-08-04 DIAGNOSIS — Z79899 Other long term (current) drug therapy: Secondary | ICD-10-CM | POA: Diagnosis not present

## 2018-08-04 DIAGNOSIS — I10 Essential (primary) hypertension: Secondary | ICD-10-CM | POA: Insufficient documentation

## 2018-08-04 DIAGNOSIS — C50211 Malignant neoplasm of upper-inner quadrant of right female breast: Secondary | ICD-10-CM

## 2018-08-04 DIAGNOSIS — Z923 Personal history of irradiation: Secondary | ICD-10-CM | POA: Diagnosis not present

## 2018-08-04 DIAGNOSIS — Z79810 Long term (current) use of selective estrogen receptor modulators (SERMs): Secondary | ICD-10-CM | POA: Diagnosis not present

## 2018-08-04 DIAGNOSIS — Z170 Estrogen receptor positive status [ER+]: Secondary | ICD-10-CM

## 2018-08-04 DIAGNOSIS — F419 Anxiety disorder, unspecified: Secondary | ICD-10-CM | POA: Diagnosis not present

## 2018-08-04 DIAGNOSIS — C7951 Secondary malignant neoplasm of bone: Secondary | ICD-10-CM | POA: Diagnosis not present

## 2018-08-04 DIAGNOSIS — C787 Secondary malignant neoplasm of liver and intrahepatic bile duct: Secondary | ICD-10-CM

## 2018-08-04 DIAGNOSIS — C50112 Malignant neoplasm of central portion of left female breast: Secondary | ICD-10-CM

## 2018-08-04 DIAGNOSIS — Z791 Long term (current) use of non-steroidal anti-inflammatories (NSAID): Secondary | ICD-10-CM | POA: Diagnosis not present

## 2018-08-04 DIAGNOSIS — C50912 Malignant neoplasm of unspecified site of left female breast: Secondary | ICD-10-CM | POA: Diagnosis not present

## 2018-08-04 DIAGNOSIS — F329 Major depressive disorder, single episode, unspecified: Secondary | ICD-10-CM | POA: Insufficient documentation

## 2018-08-04 DIAGNOSIS — E785 Hyperlipidemia, unspecified: Secondary | ICD-10-CM | POA: Insufficient documentation

## 2018-08-04 DIAGNOSIS — C50011 Malignant neoplasm of nipple and areola, right female breast: Secondary | ICD-10-CM

## 2018-08-04 DIAGNOSIS — Z9013 Acquired absence of bilateral breasts and nipples: Secondary | ICD-10-CM | POA: Diagnosis not present

## 2018-08-04 DIAGNOSIS — Z975 Presence of (intrauterine) contraceptive device: Secondary | ICD-10-CM | POA: Diagnosis not present

## 2018-08-04 DIAGNOSIS — C50012 Malignant neoplasm of nipple and areola, left female breast: Secondary | ICD-10-CM

## 2018-08-04 LAB — CBC WITH DIFFERENTIAL (CANCER CENTER ONLY)
Abs Immature Granulocytes: 0.02 10*3/uL (ref 0.00–0.07)
Basophils Absolute: 0 10*3/uL (ref 0.0–0.1)
Basophils Relative: 0 %
Eosinophils Absolute: 0.2 10*3/uL (ref 0.0–0.5)
Eosinophils Relative: 4 %
HCT: 37.2 % (ref 36.0–46.0)
Hemoglobin: 11.9 g/dL — ABNORMAL LOW (ref 12.0–15.0)
Immature Granulocytes: 1 %
Lymphocytes Relative: 9 %
Lymphs Abs: 0.4 10*3/uL — ABNORMAL LOW (ref 0.7–4.0)
MCH: 28.5 pg (ref 26.0–34.0)
MCHC: 32 g/dL (ref 30.0–36.0)
MCV: 89.2 fL (ref 80.0–100.0)
Monocytes Absolute: 0.3 10*3/uL (ref 0.1–1.0)
Monocytes Relative: 7 %
Neutro Abs: 3.1 10*3/uL (ref 1.7–7.7)
Neutrophils Relative %: 79 %
Platelet Count: 312 10*3/uL (ref 150–400)
RBC: 4.17 MIL/uL (ref 3.87–5.11)
RDW: 14.1 % (ref 11.5–15.5)
WBC Count: 4 10*3/uL (ref 4.0–10.5)
nRBC: 0 % (ref 0.0–0.2)

## 2018-08-04 LAB — COMPREHENSIVE METABOLIC PANEL
ALT: 26 U/L (ref 0–44)
AST: 23 U/L (ref 15–41)
Albumin: 3.3 g/dL — ABNORMAL LOW (ref 3.5–5.0)
Alkaline Phosphatase: 128 U/L — ABNORMAL HIGH (ref 38–126)
Anion gap: 12 (ref 5–15)
BUN: 8 mg/dL (ref 6–20)
CO2: 25 mmol/L (ref 22–32)
Calcium: 9.2 mg/dL (ref 8.9–10.3)
Chloride: 105 mmol/L (ref 98–111)
Creatinine, Ser: 0.91 mg/dL (ref 0.44–1.00)
GFR calc Af Amer: >60 mL/min (ref >60)
GFR calc non Af Amer: >60 mL/min (ref >60)
Glucose, Bld: 145 mg/dL — ABNORMAL HIGH (ref 70–99)
Potassium: 3.3 mmol/L — ABNORMAL LOW (ref 3.5–5.1)
Sodium: 142 mmol/L (ref 135–145)
Total Bilirubin: 0.4 mg/dL (ref 0.3–1.2)
Total Protein: 7.2 g/dL (ref 6.5–8.1)

## 2018-08-04 MED ORDER — DENOSUMAB 120 MG/1.7ML ~~LOC~~ SOLN
120.0000 mg | Freq: Once | SUBCUTANEOUS | Status: AC
  Administered 2018-08-04: 120 mg via SUBCUTANEOUS

## 2018-08-04 MED ORDER — DENOSUMAB 120 MG/1.7ML ~~LOC~~ SOLN
SUBCUTANEOUS | Status: AC
  Filled 2018-08-04: qty 1.7

## 2018-08-04 NOTE — Progress Notes (Signed)
Bilateral lower extremity venous duplex completed. Refer to "CV Proc" under chart review to view preliminary results.  08/04/2018 10:25 AM Maudry Mayhew, MHA, RVT, RDCS, RDMS

## 2018-08-04 NOTE — Telephone Encounter (Signed)
Added f/u for 5/26 per 3/26 schedule message. Appointment added to already existing appointments for 5/26. Start time has not changed.

## 2018-08-04 NOTE — Telephone Encounter (Signed)
Gave patient avs report and appointments for April thru September. Doppler scheduled by desk nurse. Patient will be contacted re mri. Desk nurse will look into facility re mri as none was listed in the location area of the mri order.

## 2018-08-04 NOTE — Patient Instructions (Signed)

## 2018-08-05 LAB — CANCER ANTIGEN 27.29: CA 27.29: 52.1 U/mL — ABNORMAL HIGH (ref 0.0–38.6)

## 2018-08-17 MED FILL — LOSARTAN POTASSIUM 100 MG T: 100 | 30 days supply | Qty: 30 | Fill #2

## 2018-08-19 MED FILL — LIVALO 2 MG TABLET: 2 | 90 days supply | Qty: 90 | Fill #1

## 2018-08-19 MED FILL — AFINITOR 10 MG TABLET: 10 | 28 days supply | Qty: 28 | Fill #1

## 2018-08-22 MED FILL — PREGABALIN 100 MG CAPS: 100 | 30 days supply | Qty: 90 | Fill #2

## 2018-09-01 ENCOUNTER — Other Ambulatory Visit: Payer: Self-pay

## 2018-09-01 ENCOUNTER — Inpatient Hospital Stay: Payer: 59

## 2018-09-01 ENCOUNTER — Inpatient Hospital Stay: Payer: 59 | Attending: Oncology

## 2018-09-01 VITALS — BP 148/83 | HR 87 | Temp 99.5°F | Resp 18

## 2018-09-01 DIAGNOSIS — C787 Secondary malignant neoplasm of liver and intrahepatic bile duct: Secondary | ICD-10-CM | POA: Insufficient documentation

## 2018-09-01 DIAGNOSIS — Z9221 Personal history of antineoplastic chemotherapy: Secondary | ICD-10-CM | POA: Diagnosis not present

## 2018-09-01 DIAGNOSIS — M8788 Other osteonecrosis, other site: Secondary | ICD-10-CM | POA: Diagnosis not present

## 2018-09-01 DIAGNOSIS — C50012 Malignant neoplasm of nipple and areola, left female breast: Secondary | ICD-10-CM

## 2018-09-01 DIAGNOSIS — C50211 Malignant neoplasm of upper-inner quadrant of right female breast: Secondary | ICD-10-CM | POA: Insufficient documentation

## 2018-09-01 DIAGNOSIS — C50011 Malignant neoplasm of nipple and areola, right female breast: Secondary | ICD-10-CM

## 2018-09-01 DIAGNOSIS — C7951 Secondary malignant neoplasm of bone: Secondary | ICD-10-CM | POA: Diagnosis not present

## 2018-09-01 DIAGNOSIS — Z9013 Acquired absence of bilateral breasts and nipples: Secondary | ICD-10-CM | POA: Diagnosis not present

## 2018-09-01 DIAGNOSIS — Z87311 Personal history of (healed) other pathological fracture: Secondary | ICD-10-CM | POA: Diagnosis not present

## 2018-09-01 DIAGNOSIS — Z79811 Long term (current) use of aromatase inhibitors: Secondary | ICD-10-CM | POA: Insufficient documentation

## 2018-09-01 DIAGNOSIS — Z170 Estrogen receptor positive status [ER+]: Secondary | ICD-10-CM | POA: Insufficient documentation

## 2018-09-01 DIAGNOSIS — Z923 Personal history of irradiation: Secondary | ICD-10-CM | POA: Insufficient documentation

## 2018-09-01 DIAGNOSIS — C50112 Malignant neoplasm of central portion of left female breast: Secondary | ICD-10-CM

## 2018-09-01 LAB — CBC WITH DIFFERENTIAL/PLATELET
Abs Immature Granulocytes: 0.02 10*3/uL (ref 0.00–0.07)
Basophils Absolute: 0 10*3/uL (ref 0.0–0.1)
Basophils Relative: 1 %
Eosinophils Absolute: 0.2 10*3/uL (ref 0.0–0.5)
Eosinophils Relative: 4 %
HCT: 35 % — ABNORMAL LOW (ref 36.0–46.0)
Hemoglobin: 11.5 g/dL — ABNORMAL LOW (ref 12.0–15.0)
Immature Granulocytes: 0 %
Lymphocytes Relative: 7 %
Lymphs Abs: 0.4 10*3/uL — ABNORMAL LOW (ref 0.7–4.0)
MCH: 28.2 pg (ref 26.0–34.0)
MCHC: 32.9 g/dL (ref 30.0–36.0)
MCV: 85.8 fL (ref 80.0–100.0)
Monocytes Absolute: 0.4 10*3/uL (ref 0.1–1.0)
Monocytes Relative: 7 %
Neutro Abs: 4.2 10*3/uL (ref 1.7–7.7)
Neutrophils Relative %: 81 %
Platelets: 284 10*3/uL (ref 150–400)
RBC: 4.08 MIL/uL (ref 3.87–5.11)
RDW: 13.7 % (ref 11.5–15.5)
WBC: 5.2 10*3/uL (ref 4.0–10.5)
nRBC: 0 % (ref 0.0–0.2)

## 2018-09-01 LAB — CMP (CANCER CENTER ONLY)
ALT: 22 U/L (ref 0–44)
AST: 21 U/L (ref 15–41)
Albumin: 3.1 g/dL — ABNORMAL LOW (ref 3.5–5.0)
Alkaline Phosphatase: 143 U/L — ABNORMAL HIGH (ref 38–126)
Anion gap: 13 (ref 5–15)
BUN: 8 mg/dL (ref 6–20)
CO2: 26 mmol/L (ref 22–32)
Calcium: 9.2 mg/dL (ref 8.9–10.3)
Chloride: 104 mmol/L (ref 98–111)
Creatinine: 0.9 mg/dL (ref 0.44–1.00)
GFR, Est AFR Am: >60 mL/min (ref >60)
GFR, Estimated: >60 mL/min (ref >60)
Glucose, Bld: 138 mg/dL — ABNORMAL HIGH (ref 70–99)
Potassium: 3.2 mmol/L — ABNORMAL LOW (ref 3.5–5.1)
Sodium: 143 mmol/L (ref 135–145)
Total Bilirubin: 0.2 mg/dL — ABNORMAL LOW (ref 0.3–1.2)
Total Protein: 7.1 g/dL (ref 6.5–8.1)

## 2018-09-01 MED ORDER — DENOSUMAB 120 MG/1.7ML ~~LOC~~ SOLN
SUBCUTANEOUS | Status: AC
  Filled 2018-09-01: qty 1.7

## 2018-09-01 MED ORDER — DENOSUMAB 120 MG/1.7ML ~~LOC~~ SOLN
120.0000 mg | Freq: Once | SUBCUTANEOUS | Status: AC
  Administered 2018-09-01: 120 mg via SUBCUTANEOUS

## 2018-09-01 NOTE — Patient Instructions (Signed)

## 2018-09-02 LAB — CANCER ANTIGEN 27.29: CA 27.29: 47.7 U/mL — ABNORMAL HIGH (ref 0.0–38.6)

## 2018-09-12 ENCOUNTER — Other Ambulatory Visit: Payer: Self-pay | Admitting: Oncology

## 2018-09-13 ENCOUNTER — Encounter (HOSPITAL_COMMUNITY): Payer: 59

## 2018-09-13 ENCOUNTER — Other Ambulatory Visit (HOSPITAL_COMMUNITY): Payer: 59

## 2018-09-13 ENCOUNTER — Other Ambulatory Visit: Payer: Self-pay

## 2018-09-13 ENCOUNTER — Ambulatory Visit (HOSPITAL_COMMUNITY)
Admission: RE | Admit: 2018-09-13 | Discharge: 2018-09-13 | Disposition: A | Discharge status: Home / Self Care | Payer: 59 | Source: Ambulatory Visit | Attending: Oncology | Admitting: Oncology

## 2018-09-13 ENCOUNTER — Ambulatory Visit (HOSPITAL_COMMUNITY): Payer: 59

## 2018-09-13 DIAGNOSIS — C787 Secondary malignant neoplasm of liver and intrahepatic bile duct: Secondary | ICD-10-CM | POA: Insufficient documentation

## 2018-09-13 DIAGNOSIS — C50112 Malignant neoplasm of central portion of left female breast: Secondary | ICD-10-CM | POA: Diagnosis not present

## 2018-09-13 DIAGNOSIS — C50211 Malignant neoplasm of upper-inner quadrant of right female breast: Secondary | ICD-10-CM | POA: Insufficient documentation

## 2018-09-13 DIAGNOSIS — C50919 Malignant neoplasm of unspecified site of unspecified female breast: Secondary | ICD-10-CM | POA: Diagnosis not present

## 2018-09-13 DIAGNOSIS — C7951 Secondary malignant neoplasm of bone: Secondary | ICD-10-CM | POA: Diagnosis not present

## 2018-09-13 DIAGNOSIS — C50011 Malignant neoplasm of nipple and areola, right female breast: Secondary | ICD-10-CM | POA: Diagnosis not present

## 2018-09-13 DIAGNOSIS — Z170 Estrogen receptor positive status [ER+]: Secondary | ICD-10-CM | POA: Insufficient documentation

## 2018-09-13 DIAGNOSIS — C50012 Malignant neoplasm of nipple and areola, left female breast: Secondary | ICD-10-CM | POA: Diagnosis not present

## 2018-09-13 DIAGNOSIS — M899 Disorder of bone, unspecified: Secondary | ICD-10-CM | POA: Diagnosis not present

## 2018-09-13 DIAGNOSIS — G893 Neoplasm related pain (acute) (chronic): Secondary | ICD-10-CM | POA: Insufficient documentation

## 2018-09-13 MED ORDER — TECHNETIUM TC 99M MEDRONATE IV KIT
20.0000 | PACK | Freq: Once | INTRAVENOUS | Status: AC | PRN
  Administered 2018-09-13: 22 via INTRAVENOUS

## 2018-09-14 ENCOUNTER — Other Ambulatory Visit: Payer: Self-pay | Admitting: Oncology

## 2018-09-14 NOTE — Progress Notes (Unsigned)
I called are stable.  He tells me that there is a little bit of swelling in the right hand and minimally in the right arm.  I suggested that she get a sleeve and glove and she will go to a medical supply store to obtain that.  She will let us know if we need to put in an order

## 2018-09-15 MED FILL — LOSARTAN POTASSIUM 100 MG T: 100 | 30 days supply | Qty: 30 | Fill #3

## 2018-09-15 MED FILL — ANASTROZOLE 1 MG TABLET: 1 | 90 days supply | Qty: 90 | Fill #3

## 2018-09-15 MED FILL — AFINITOR 10 MG TABLET: 10 | 28 days supply | Qty: 28 | Fill #2

## 2018-09-15 MED FILL — PREGABALIN 100 MG CAPS: 100 | 30 days supply | Qty: 90 | Fill #0

## 2018-09-15 MED FILL — CHLORHEXIDINE 0.12% RINSE: 0.12 | 15 days supply | Qty: 473 | Fill #0

## 2018-10-04 ENCOUNTER — Inpatient Hospital Stay: Payer: 59

## 2018-10-04 ENCOUNTER — Other Ambulatory Visit: Payer: Self-pay

## 2018-10-04 ENCOUNTER — Inpatient Hospital Stay (HOSPITAL_BASED_OUTPATIENT_CLINIC_OR_DEPARTMENT_OTHER): Payer: 59 | Admitting: Oncology

## 2018-10-04 ENCOUNTER — Inpatient Hospital Stay: Payer: 59 | Attending: Oncology

## 2018-10-04 VITALS — BP 155/79 | HR 99 | Temp 97.8°F | Resp 8 | Ht 66.5 in | Wt 271.8 lb

## 2018-10-04 DIAGNOSIS — C50011 Malignant neoplasm of nipple and areola, right female breast: Secondary | ICD-10-CM

## 2018-10-04 DIAGNOSIS — C7951 Secondary malignant neoplasm of bone: Secondary | ICD-10-CM | POA: Diagnosis not present

## 2018-10-04 DIAGNOSIS — Z170 Estrogen receptor positive status [ER+]: Secondary | ICD-10-CM

## 2018-10-04 DIAGNOSIS — Z87311 Personal history of (healed) other pathological fracture: Secondary | ICD-10-CM | POA: Insufficient documentation

## 2018-10-04 DIAGNOSIS — Z9221 Personal history of antineoplastic chemotherapy: Secondary | ICD-10-CM | POA: Insufficient documentation

## 2018-10-04 DIAGNOSIS — Z79899 Other long term (current) drug therapy: Secondary | ICD-10-CM

## 2018-10-04 DIAGNOSIS — Z9013 Acquired absence of bilateral breasts and nipples: Secondary | ICD-10-CM | POA: Insufficient documentation

## 2018-10-04 DIAGNOSIS — M8788 Other osteonecrosis, other site: Secondary | ICD-10-CM

## 2018-10-04 DIAGNOSIS — C50211 Malignant neoplasm of upper-inner quadrant of right female breast: Secondary | ICD-10-CM

## 2018-10-04 DIAGNOSIS — C50012 Malignant neoplasm of nipple and areola, left female breast: Secondary | ICD-10-CM

## 2018-10-04 DIAGNOSIS — Z923 Personal history of irradiation: Secondary | ICD-10-CM | POA: Insufficient documentation

## 2018-10-04 DIAGNOSIS — C787 Secondary malignant neoplasm of liver and intrahepatic bile duct: Secondary | ICD-10-CM | POA: Diagnosis not present

## 2018-10-04 DIAGNOSIS — C50112 Malignant neoplasm of central portion of left female breast: Secondary | ICD-10-CM

## 2018-10-04 DIAGNOSIS — Z79811 Long term (current) use of aromatase inhibitors: Secondary | ICD-10-CM

## 2018-10-04 LAB — CBC WITH DIFFERENTIAL (CANCER CENTER ONLY)
Abs Immature Granulocytes: 0.03 10*3/uL (ref 0.00–0.07)
Basophils Absolute: 0 10*3/uL (ref 0.0–0.1)
Basophils Relative: 1 %
Eosinophils Absolute: 0.2 10*3/uL (ref 0.0–0.5)
Eosinophils Relative: 3 %
HCT: 33.9 % — ABNORMAL LOW (ref 36.0–46.0)
Hemoglobin: 10.8 g/dL — ABNORMAL LOW (ref 12.0–15.0)
Immature Granulocytes: 1 %
Lymphocytes Relative: 9 %
Lymphs Abs: 0.4 10*3/uL — ABNORMAL LOW (ref 0.7–4.0)
MCH: 28.2 pg (ref 26.0–34.0)
MCHC: 31.9 g/dL (ref 30.0–36.0)
MCV: 88.5 fL (ref 80.0–100.0)
Monocytes Absolute: 0.4 10*3/uL (ref 0.1–1.0)
Monocytes Relative: 10 %
Neutro Abs: 3.4 10*3/uL (ref 1.7–7.7)
Neutrophils Relative %: 76 %
Platelet Count: 287 10*3/uL (ref 150–400)
RBC: 3.83 MIL/uL — ABNORMAL LOW (ref 3.87–5.11)
RDW: 14.2 % (ref 11.5–15.5)
WBC Count: 4.4 10*3/uL (ref 4.0–10.5)
nRBC: 0 % (ref 0.0–0.2)

## 2018-10-04 LAB — COMPREHENSIVE METABOLIC PANEL
ALT: 27 U/L (ref 0–44)
AST: 22 U/L (ref 15–41)
Albumin: 3.1 g/dL — ABNORMAL LOW (ref 3.5–5.0)
Alkaline Phosphatase: 139 U/L — ABNORMAL HIGH (ref 38–126)
Anion gap: 10 (ref 5–15)
BUN: 11 mg/dL (ref 6–20)
CO2: 26 mmol/L (ref 22–32)
Calcium: 8.6 mg/dL — ABNORMAL LOW (ref 8.9–10.3)
Chloride: 105 mmol/L (ref 98–111)
Creatinine, Ser: 1.13 mg/dL — ABNORMAL HIGH (ref 0.44–1.00)
GFR calc Af Amer: >60 mL/min (ref >60)
GFR calc non Af Amer: 55 mL/min — ABNORMAL LOW (ref >60)
Glucose, Bld: 156 mg/dL — ABNORMAL HIGH (ref 70–99)
Potassium: 3.4 mmol/L — ABNORMAL LOW (ref 3.5–5.1)
Sodium: 141 mmol/L (ref 135–145)
Total Bilirubin: 0.2 mg/dL — ABNORMAL LOW (ref 0.3–1.2)
Total Protein: 6.9 g/dL (ref 6.5–8.1)

## 2018-10-04 MED ORDER — DENOSUMAB 120 MG/1.7ML ~~LOC~~ SOLN
120.0000 mg | Freq: Once | SUBCUTANEOUS | Status: AC
  Administered 2018-10-04: 120 mg via SUBCUTANEOUS

## 2018-10-04 MED ORDER — DENOSUMAB 120 MG/1.7ML ~~LOC~~ SOLN
SUBCUTANEOUS | Status: AC
  Filled 2018-10-04: qty 1.7

## 2018-10-04 NOTE — Progress Notes (Signed)
[facility]  [phone] [phone]    ID: LA[place] BREEDING DOB: 02/17/1966  MR#: [id number]  VVO#:[id number]  Patient Care Team: Kelton Pillar, MD as PCP - General (Family Medicine) Jacelyn Pi, MD as Consulting Physician (Endocrinology) Leo Grosser Seymour Bars, MD as Consulting Physician (Obstetrics and Gynecology) Jovita Kussmaul, MD as Consulting Physician (General Surgery) Vaneza Pickart, Virgie Dad, MD as Consulting Physician (Oncology) [name], MD as Attending Physician (Radiation Oncology) Belva Crome, MD as Consulting Physician (Cardiology) Crissie Reese, MD as Consulting Physician (Plastic Surgery) [name], [name], NP as Nurse Practitioner (Hematology and Oncology) Margarette Canada (Inactive) OTHER MD:   CHIEF COMPLAINT: Estrogen receptor positive stage IV  breast cancer  CURRENT TREATMENT:  Anastrozole, affinitor; denosumab/Xgeva   INTERVAL HISTORY: Kevin returns today for follow-up and treatment of her estrogen receptor positive stage IV breast cancer.   She continues on anastrozole.  She tolerates this well without unusual side effects in particular without significant hot flashes or vaginal dryness problems.  She also continues on affinitor.  She is using the dexamethasone mouth rinse.  She reports some tenderness to her jaw but denies mouth sores or dental issues. She notes a cough that is unchanged.  She also continues on denosumab/Xgeva every 28 days, with a dose due today.  Her osteonecrosis area is closely followed by Dr. Romie Minus. She reports good days and bad days, as well as pain to her right arm and hand and both legs.  [place]'s last bone density screening on 01/09/2016, showed a T-score of 0.7, which is considered normal.    Since her last visit, she underwent bone scan on 09/13/2018. This showed: no new bone metastases; slight increased intensity in midthoracic lesions with decreased intensity in the mid lumbar  lesion, the other lesions are unchanged.  She also underwent lower extremity DVT testing on 08/04/2018, which was negative.   REVIEW OF SYSTEMS: Imani reports doing well overall. 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 She notes her son, age 37, moved out earlier this year, and she expresses missing seeing him.   The patient denies unusual headaches, visual changes, nausea, vomiting, stiff neck, dizziness, or gait imbalance. There has been no cough, phlegm production, or pleurisy, no chest pain or pressure, and no change in bowel or bladder habits. The patient denies fever, rash, bleeding, unexplained fatigue or unexplained weight loss. A detailed review of systems was otherwise entirely negative.   BREAST CANCER HISTORY: Tyja had screening mammography with tomosynthesis at the [facility] 10/02/2015. This showed 2 possible masses in the right breast as well as some suspicious calcifications. The left breast was unremarkable. On 10/15/2015 she underwent right diagnostic mammography with tomography and right breast ultrasonography. The breast density was category B. Spot magnification views confirmed an area of pleomorphic calcifications measuring up to 5 cm in the upper half of the breast. Also in the upper outer right breast there were adjacent low-density masses without distortion or calcifications. On physical exam there was no palpable abnormality.  Targeted ultrasound of the right breast found the 2 "masses" in the right upper quadrant were benign cysts measuring 0.8 and 0.5 cm respectively. Biopsy of the area of calcification on 10/15/2015 showed ([id number]) invasive ductal carcinoma, E-cadherin positive, grade 2, estrogen receptor 90% positive, progesterone receptor 50% positive, both with strong staining intensity, with an MIB-1 of 70%, and no HER-2 amplification, the signals ratio being 1.18  and the number per cell 2.60.   Her subsequent history is as detailed below    PAST MEDICAL HISTORY: Past Medical History:  Diagnosis Date  . Anxiety   . Arthritis   . Bone metastasis ([place])   . Breast cancer of upper-inner quadrant of right female breast [facility]) oncologist-  dr [name]--- 04/ 2019 ER/PR+ Stage IV w/ metastatic disease to liver, total spine, lung, bone   dx 06/ 2017  right breast invasive ductal carcinoma, Stage IIB, ER/PR + (pT2 pN1), grade 1-- s/p  right breast lumpecotmy w/ sln bx's and bilateral breast reduction 11-28-2015/  left breast with reduction , dx invasive ductal ca, Grade 1 (pT1cpNX) ER/PR positive/  adjuvant radiation completed 03-27-2016 to right chest wall, 2018  reclassifiec Stage IIA  . Cancer, metastatic to liver ([place])   . Chronic back pain   . Constipation   . Depression   . Family history of adverse reaction to anesthesia     mother has Copd was kept on vent   . Headache   . Heart murmur   . History of hyperthyroidism    s/p  RAI  . History of radiation therapy 02/11/16- 03/27/16   Right Chest Wall 50.4 Gy in 28 fractions, Right Chest Wall Boost 10 Gy in 5 fractions.   . Hyperlipidemia   . Hypertension   . Hypothyroidism, postradioiodine therapy    endocrinoloigst-  dr balan--  2004  s/p  RAI  . Menorrhagia 2011  . Multiple pulmonary nodules    secondary  metastatic  . PONV (postoperative nausea and vomiting)    patch helps   . SUI (stress urinary incontinence, female)   . Wears contact lenses   . Wears dentures    full upper and lower partial    PAST SURGICAL HISTORY: Past Surgical History:  Procedure Laterality Date  . BREAST LUMPECTOMY WITH NEEDLE LOCALIZATION AND AXILLARY SENTINEL LYMPH NODE BX Right 11/28/2015   Procedure: RIGHT BREAST LUMPECTOMY WITH NEEDLE LOCALIZATION AND AXILLARY SENTINEL LYMPH NODE BX;  Surgeon: Autumn Messing III, MD;  Location: [facility];  Service: General;  Laterality: Right;  . BREAST REDUCTION SURGERY Bilateral 11/28/2015   Procedure: MAMMARY  REDUCTION  (BREAST) BILATERAL;  Surgeon: Crissie Reese, MD;  Location: [facility];  Service: Plastics;  Laterality: Bilateral;  . CESAREAN SECTION  01/12/1993  . COLONOSCOPY    . Dental surgeries    . FEMUR IM NAIL Left 01/28/2018   Procedure: INTRAMEDULLARY (IM) NAIL FEMORAL;  Surgeon: Nicholes Stairs, MD;  Location: [facility];  Service: Orthopedics;  Laterality: Left;  . HYSTEROSCOPY N/A 09/02/2017   Procedure: HYSTEROSCOPY  to remove IUD;  Surgeon: Eldred Manges, MD;  Location: [facility];  Service: Gynecology;  Laterality: N/A;  . Liver biospy    . MASTECTOMY    . MASTECTOMY W/ SENTINEL NODE BIOPSY Bilateral 12/26/2015   Procedure: BILATERAL MASTECTOMY WITH LEFT SENTINEL LYMPH NODE BIOPSY;  Surgeon: Autumn Messing III, MD;  Location: [facility];  Service: General;  Laterality: Bilateral;  . Removal of IUD    . TUBAL LIGATION Bilateral 1995    FAMILY HISTORY: Family History  Problem Relation Age of Onset  . Diabetes Mother   . Hypertension Mother   . Hypertension Father   . Diabetes Maternal Grandmother   . Hypertension Sister   . Cancer Paternal Grandmother        colon  . Colon cancer Paternal Grandmother   . Esophageal cancer Neg Hx   . Stomach  cancer Neg Hx   . Rectal cancer Neg Hx    The patient's parents are still living, in their early 23s as of June 2017. The patient has one brother, 2 sisters. On the maternal side there is a history of uterine and prostate cancer. On the paternal side there is a history of colon cancer and possibly uterine cancer. There is no history of breast or ovarian cancer in the family.    GYNECOLOGIC HISTORY:  No LMP recorded. (Menstrual status: IUD).  Menarche age 58. She still having regular periods. She is GX P1, first pregnancy age 14. She used oral contraceptives for a few years remotely, with no complications. She is currently on a Mirena IUD and also is status post bilateral tubal ligation.   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 Her husband [name] is a Administrator.  They are both taking appropriate precautions at work.  Their son [name], age 33, is a radio DJ.  He no longer lives with the patient.  The patient has no grandchildren. She is not a Ambulance person.   ADVANCED DIRECTIVES: Not in place   HEALTH MAINTENANCE: Social History   Tobacco Use  . Smoking status: Never Smoker  . Smokeless tobacco: Never Used  Substance Use Topics  . Alcohol use: No  . Drug use: No     Colonoscopy: January 2016   PAP: January 2016   Bone density: August 2017 showed a T score of +0.7 normal  Lipid panel:  Allergies  Allergen Reactions  . Ace Inhibitors Cough  . Atorvastatin Other (See Comments)    Joint pain  . Simvastatin Other (See Comments)    Joint pain    Current Outpatient Medications  Medication Sig Dispense Refill  . acetaminophen (TYLENOL) 325 MG tablet Take 325 mg by mouth every 6 (six) hours as needed for mild pain or headache.     Lenon Oms 10 MG tablet TAKE 1 TABLET BY MOUTH DAILY. 28 tablet 2  . anastrozole (ARIMIDEX) 1 MG tablet Take 1 tablet (1 mg total) by mouth daily. 90 tablet 4  . Calcium-Magnesium 250-125 MG TABS Take 1 tablet by mouth daily. 120 each 4  . cetirizine (ZYRTEC ALLERGY) 10 MG tablet Take 10 mg by mouth daily as needed for allergies.     Marland Kitchen dexamethasone (DECADRON) 0.5 MG/5ML solution Swish 47m in mouth for 253m and spit out. Use 4 times daily for 8 weeks, start with Afinitor. Avoid eating/drinking for 1hr after rinse. 500 mL 4  . diclofenac sodium (VOLTAREN) 1 % GEL Apply 4 g topically 4 (four) times daily. 2 Tube 2  . docusate sodium (COLACE) 100 MG capsule Take 200-400 mg by mouth daily.    . Ferrous Sulfate (IRON) 325 (65 Fe) MG TABS Take 1 tablet (325 mg total) by mouth daily. 30 each 2  . hydrocortisone cream 1 % Apply 1 application topically daily as needed (for rash).    . Marland Kitchenbuprofen (ADVIL,MOTRIN) 600 MG tablet Take  1 tablet (600 mg total) by mouth every 6 (six) hours as needed for moderate pain. 60 tablet 0  . levothyroxine (SYNTHROID, LEVOTHROID) 137 MCG tablet Take 137 mcg by mouth daily before breakfast.     . LIVALO 2 MG TABS Take 2 mg by mouth daily.   6  . LORazepam (ATIVAN) 0.5 MG tablet Take 1 tablet (0.5 mg total) by mouth at bedtime as needed for anxiety. 30 tablet 0  .  losartan (COZAAR) 100 MG tablet Take 100 mg by mouth every morning.     . Multiple Vitamin (MULTIVITAMIN) tablet Take 1 tablet by mouth daily.    Marland Kitchen omega-3 acid ethyl esters (LOVAZA) 1 g capsule Take 1 g by mouth 2 (two) times daily.     . potassium chloride SA (K-DUR,KLOR-CON) 20 MEQ tablet Take 40 mEq by mouth daily.     . pregabalin (LYRICA) 100 MG capsule TAKE 1 CAPSULE BY MOUTH 3 TIMES DAILY 90 capsule 2  . prochlorperazine (COMPAZINE) 10 MG tablet Take 1 tablet (10 mg total) by mouth every 6 (six) hours as needed for nausea or vomiting. (Patient not taking: Reported on 04/01/2018) 30 tablet 0  . triamterene-hydrochlorothiazide (DYAZIDE) 37.5-25 MG capsule Take 1 each (1 capsule total) by mouth daily. 90 capsule 4  . Vitamin D, Cholecalciferol, 1000 units CAPS Take 1 tablet by mouth daily. (Patient taking differently: Take 1,000 Units by mouth daily. ) 90 capsule 4   No current facility-administered medications for this visit.     OBJECTIVE: Middle-aged African-American woman who appears stated age 89:   10/04/18 0828  BP: (!) 155/79  Pulse: 99  Resp: (!) 8  Temp: 97.8 F (36.6 C)  SpO2: 100%     Body mass index is 43.21 kg/m.    ECOG FS:1 - Symptomatic but completely ambulatory  Filed Weights   10/04/18 0828  Weight: 271 lb 12.8 oz (123.3 kg)   Sclerae unicteric, EOMs intact Wearing a mask No cervical or supraclavicular adenopathy Lungs no rales or rhonchi Heart regular rate and rhythm Abd soft, nontender, positive bowel sounds MSK no focal spinal tenderness, chronic grade 1 right upper extremity  lymphedema Neuro: nonfocal, well oriented, appropriate affect Breasts: Status post bilateral mastectomy; no evidence of chest wall recurrence; both axillae are benign.  LAB RESULTS:  CMP     Component Value Date/Time   NA 143 09/01/2018 1000   NA 140 07/08/2016 0948   K 3.2 (L) 09/01/2018 1000   K 4.1 07/08/2016 0948   CL 104 09/01/2018 1000   CO2 26 09/01/2018 1000   CO2 24 07/08/2016 0948   GLUCOSE 138 (H) 09/01/2018 1000   GLUCOSE 89 07/08/2016 0948   BUN 8 09/01/2018 1000   BUN 11.9 07/08/2016 0948   CREATININE 0.90 09/01/2018 1000   CREATININE 0.9 07/08/2016 0948   CALCIUM 9.2 09/01/2018 1000   CALCIUM 10.0 07/08/2016 0948   PROT 7.1 09/01/2018 1000   PROT 7.6 07/08/2016 0948   ALBUMIN 3.1 (L) 09/01/2018 1000   ALBUMIN 3.6 07/08/2016 0948   AST 21 09/01/2018 1000   AST 22 07/08/2016 0948   ALT 22 09/01/2018 1000   ALT 27 07/08/2016 0948   ALKPHOS 143 (H) 09/01/2018 1000   ALKPHOS 115 07/08/2016 0948   BILITOT 0.2 (L) 09/01/2018 1000   BILITOT 0.35 07/08/2016 0948   GFRNONAA >60 09/01/2018 1000   GFRAA >60 09/01/2018 1000    INo results found for: SPEP, UPEP  Lab Results  Component Value Date   WBC 4.4 10/04/2018   NEUTROABS 3.4 10/04/2018   HGB 10.8 (L) 10/04/2018   HCT 33.9 (L) 10/04/2018   MCV 88.5 10/04/2018   PLT 287 10/04/2018      Chemistry      Component Value Date/Time   NA 143 09/01/2018 1000   NA 140 07/08/2016 0948   K 3.2 (L) 09/01/2018 1000   K 4.1 07/08/2016 0948   CL 104 09/01/2018 1000   CO2 26  09/01/2018 1000   CO2 24 07/08/2016 0948   BUN 8 09/01/2018 1000   BUN 11.9 07/08/2016 0948   CREATININE 0.90 09/01/2018 1000   CREATININE 0.9 07/08/2016 0948      Component Value Date/Time   CALCIUM 9.2 09/01/2018 1000   CALCIUM 10.0 07/08/2016 0948   ALKPHOS 143 (H) 09/01/2018 1000   ALKPHOS 115 07/08/2016 0948   AST 21 09/01/2018 1000   AST 22 07/08/2016 0948   ALT 22 09/01/2018 1000   ALT 27 07/08/2016 0948   BILITOT 0.2 (L)  09/01/2018 1000   BILITOT 0.35 07/08/2016 0948       No results found for: LABCA2  No components found for: LABCA125  No results for input(s): INR in the last 168 hours.  Urinalysis No results found for: COLORURINE, APPEARANCEUR, LABSPEC, PHURINE, GLUCOSEU, HGBUR, BILIRUBINUR, KETONESUR, PROTEINUR, UROBILINOGEN, NITRITE, LEUKOCYTESUR   STUDIES: Nm Bone Scan Whole Body  Result Date: 09/13/2018 CLINICAL DATA:  Metastatic breast cancer. EXAM: NUCLEAR MEDICINE WHOLE BODY BONE SCAN TECHNIQUE: Whole body anterior and posterior images were obtained approximately 3 hours after intravenous injection of radiopharmaceutical. RADIOPHARMACEUTICALS:  Twenty-two mCi Technetium-78mMDP IV COMPARISON:  Bone scans dated 04/19/2018 and 12/24/2017 FINDINGS: The patient has multiple metastatic lesions that appear stable to decreased in intensity since the prior exam. Small areas of activity in the lower thoracic spine are minimally more apparent but the lesion in the mid lumbar spine is slightly less apparent. No new lesions. The most prominent lesions are in the proximal left femur and in the right acetabulum and in the lower thoracic and mid to lower lumbar spine. IMPRESSION: 1. No new bone metastases. 2. Slight increased intensity in midthoracic lesions with decreased intensity in the mid lumbar lesion. The other lesions are unchanged. Electronically Signed   By: JLorriane ShireM.D.   On: 09/13/2018 16:15     ELIGIBLE FOR AVAILABLE RESEARCH PROTOCOL: PALLAS, but inelegible because of Mirena IUD   ASSESSMENT: 53y.o. [place] woman status post right breast upper inner quadrant biopsy 10/15/2015 for a clinical T2-T3 N0, stage II invasive ductal carcinoma, estrogen and progesterone receptor positive, HER-2 nonamplified, with an Mib-1 of 70%  (1) status post right lumpectomy and sentinel lymph node sampling with oncoplastic breast reduction 11/28/2015 for a pT2 pN1, stage IIB invasive ductal carcinoma, grade  1, with negative margins (1/1 sentinel node positive)  (a) reclassified as stage IIA in the 2018 prognostic revision   (2) left reduction mammoplasty 11/28/2015 unexpectedly found a pT1c pNX invasive ductal carcinoma, grade 1, estrogen and progesterone receptor positive, HER-2 not amplified, with an MIB-1 of 3%  (3) Mammaprint from the right sided breast cancer was "low risk", predicting a 98% chance of no disease recurrence within 5 years with anti-estrogens as the only systemic therapy. It also predicts minimal to no benefit from chemotherapy  (4) status post bilateral mastectomies with bilateral sentinel lymph node sampling 12/26/2015, showing  (a) on the right, no residual carcinoma (0/3 nodes involved)  (b) on the left, focal ductal carcinoma in situ, 0.2 cm, with negative margins; (0/4 nodes involved)  (5) adjuvant radiation 02/11/16 - 03/27/16    1) Right chest wall: 50.4 Gy in 28 fractions               2) Right chest wall boost: 10 Gy in 5 fractions   (6) started on tamoxifen neoadjuvantly 10/23/2015 to allow for expected delays in definitive local treatment  (a) Mirena IUD in place  (b) tamoxifen discontinued  08/13/2017, with progression  (7) since she had 4 lymph nodes removed from each axilla but also receive radiation on the right, lab draws should preferentially be obtained from the left arm   METASTATIC DISEASE: April 2019 (8) CT scan of the abdomen and pelvis 08/04/2017, MRI of the liver and total spine MRI 08/11/2017 and CT scan of the chest and brain on 08/13/2017 showed liver and bone metastases  (a) liver biopsy 09/07/2017 confirms estrogen receptor positive, progesterone receptor negative, HER-2 negative metastatic breast cancer  (b) chest CT scan 08/13/2017 shows multiple bilateral pulmonary nodules.  (c) CT of the brain with and without contrast 08/26/2017 shows no evidence of metastatic disease to the brain  (d) CA 27-29 is informative  (9) fulvestrant started  08/17/2017  (a) palbociclib started 08/31/2017  (b) fulvestrant and palbociclib discontinued 01/04/2018 with evidence of progression in the liver  (10) denosumab/Xgeva started 09/14/2017  (a) held after 03/01/2018 dose because of an erosion felt to be secondary to dentures.  (11) anastrozole started 01/04/2018  (a) everolimus started 01/29/2018  (b) liver MRI and bone scan December 2019 show stable disease  (c) chest CT scan on 08/02/2018 shows lung lesions resolved, bone lesions stable  (12) 01/28/18 prophylactic left femur intramedullary nail surgery for impending left femur pathologic fracture  (a) palliative radiation with Dr. [name] to the left femur and left ilium from 02/16/2018 to 03/01/2018  (13) [facility] report on liver biopsy from April 2019 shows a negative PDL 1, stable MSI and proficient mismatch repair status.  The tumor is positive for the androgen receptor, and for PTEN for PI K3 CA mutations.  HER-2/neu was read as equivocal (it was negative by FISH on the pathology report).Marland Kitchen   PLAN: Azalynn is now just over a year out from definitive diagnosis of metastatic breast cancer.  She is tolerating the everolimus and anastrozole generally well, with no significant side effects.  Her bone scan is stable.  We were supposed to have obtained a liver MRI prior to this visit but for some reason that did not happen.  I have reentered the order and hopefully we can get that done before she returns 4 weeks from now.  We are continuing the Xgeva with close monitoring by Dr. Romie Minus of her osteonecrosis, which is stable  If we do see liver progression we will consider yttrium or other local treatment  She knows to call before the next visit here if any new problems develop  Javone Ybanez, Virgie Dad, MD  10/04/18 9:09 AM Medical Oncology and Hematology [facility] [place], [place] [address] Tel. [phone]    Fax. [phone]   I, Wilburn Mylar, am acting as  scribe for Dr. Virgie Dad. Arli Bree.  I, Lurline Del MD, have reviewed the above documentation for accuracy and completeness, and I agree with the above.

## 2018-10-10 ENCOUNTER — Other Ambulatory Visit: Payer: Self-pay | Admitting: Oncology

## 2018-10-10 DIAGNOSIS — Z170 Estrogen receptor positive status [ER+]: Secondary | ICD-10-CM

## 2018-10-10 DIAGNOSIS — C50211 Malignant neoplasm of upper-inner quadrant of right female breast: Secondary | ICD-10-CM

## 2018-10-13 MED FILL — PREGABALIN 100 MG CAPS: 100 | 30 days supply | Qty: 90 | Fill #1

## 2018-10-13 MED FILL — AFINITOR 10 MG TABLET: 10 | 28 days supply | Qty: 28 | Fill #0

## 2018-10-13 MED FILL — LOSARTAN POTASSIUM 100 MG T: 100 | 30 days supply | Qty: 30 | Fill #4

## 2018-10-18 MED FILL — TRIAMTERENE/HCTZ 37.5/25 CP: 37.5-25 | 90 days supply | Qty: 90 | Fill #2

## 2018-10-26 MED FILL — CHLORHEXIDINE 0.12% RINSE: 0.12 | 15 days supply | Qty: 473 | Fill #0

## 2018-10-28 MED FILL — LEVOTHYROXINE 137 MCG TAB: 137 | 90 days supply | Qty: 90 | Fill #2

## 2018-10-31 NOTE — Progress Notes (Signed)
Debbie [name]  [phone] [phone]    ID: Debbie [name] DOB: Apr 11, 1966  MR#: [id number]  DBZ#:[id number]  Patient Care Team: Kelton Pillar, MD as PCP - General (Family Medicine) Jacelyn Pi, MD as Consulting Physician (Endocrinology) Leo Grosser Seymour Bars, MD as Consulting Physician (Obstetrics and Gynecology) Jovita Kussmaul, MD as Consulting Physician (General Surgery) Debbie [name], Debbie Dad, MD as Consulting Physician (Oncology) [name], MD as Attending Physician (Radiation Oncology) Belva Crome, MD as Consulting Physician (Cardiology) Debbie Reese, MD as Consulting Physician (Plastic Surgery) [name], [name], NP as Nurse Practitioner (Hematology and Oncology) Margarette Canada (Inactive) OTHER MD:   CHIEF COMPLAINT: Estrogen receptor positive stage IV  breast cancer  CURRENT TREATMENT:  Anastrozole, affinitor; denosumab/Xgeva   INTERVAL HISTORY: Debbie [name] returns today for follow-up and treatment of her estrogen receptor positive stage IV breast cancer.  She continues on anastrozole. She notes some bumps that resemble acne over her body; they occasionally itch.   She also continues on affinitor. Her cough has not worsened; she usually coughs some in the morning when her sinuses first drain and then she is ok for the rest of the day.   She also continues on denosumab/Xgeva every 28 days, with her most recent dose received on 10/04/2018. She tolerates this well and without any noticeable side effects.    Her dental issues are followed closely by Dr Debbie [name], who she see's once per month.  She notes that she still has a small spot in her mouth. They haven't done anything with it because they don't want to make it worse and it is currently stable and is not bothering her.   Debbie [name]'s last bone density screening on 01/09/2016, showed a T-score of 0.7, which is considered normal.  We are following the CA 27-29 Results for  Debbie, [name] (MRN [id number]) as of 11/01/2018 08:21  Ref. Range 04/28/2018 10:40 06/09/2018 08:52 07/07/2018 08:23 08/04/2018 08:06 09/01/2018 10:00  CA 27.29 Latest Ref Range: 0.0 - 38.6 U/mL 51.3 (H) 53.2 (H) 48.2 (H) 52.1 (H) 47.7 (H)    Since her last visit here, she has not undergone any additional studies.  She was supposed to receive an MRI of the liver, but she says that she never got a call to schedule.    REVIEW OF SYSTEMS: Debbie [name] continues to work full time. She and her husband are taking appropriate precautions against the spread of COVID-19. She does have some lower back pain. She has some minor swelling in the right arm; she need a prescription for a sleeve to have a custom one made. The patient denies unusual headaches, visual changes, nausea, vomiting, or dizziness. There has been no unusual phlegm production, or pleurisy. This been no change in bowel or bladder habits. The patient denies unexplained fatigue or unexplained weight loss, bleeding, or fever. A detailed review of systems was otherwise noncontributory.    BREAST CANCER HISTORY: Debbie [name] had screening mammography with tomosynthesis at the [facility] 10/02/2015. This showed 2 possible masses in the right breast as well as some suspicious calcifications. The left breast was unremarkable. On 10/15/2015 she underwent right diagnostic mammography with tomography and right breast ultrasonography. The breast density was category B. Spot magnification views confirmed an area of pleomorphic calcifications measuring up to 5 cm in the upper half of the breast. Also in the upper outer right breast there were adjacent low-density masses without distortion or calcifications. On physical exam there was no palpable abnormality.  Targeted ultrasound of  the right breast found the 2 "masses" in the right upper quadrant were benign cysts measuring 0.8 and 0.5 cm respectively. Biopsy of the area of calcification on 10/15/2015 showed (SAA  [id number]) invasive ductal carcinoma, E-cadherin positive, grade 2, estrogen receptor 90% positive, progesterone receptor 50% positive, both with strong staining intensity, with an MIB-1 of 70%, and no HER-2 amplification, the signals ratio being 1.18 and the number per cell 2.60.  Her subsequent history is as detailed below    PAST MEDICAL HISTORY: Past Medical History:  Diagnosis Date   Anxiety    Arthritis    Bone metastasis ([place])    Breast cancer of upper-inner quadrant of right female breast [facility]) oncologist-  dr [name]--- 04/ 2019 ER/PR+ Stage IV w/ metastatic disease to liver, total spine, lung, bone   dx 06/ 2017  right breast invasive ductal carcinoma, Stage IIB, ER/PR + (pT2 pN1), grade 1-- s/p  right breast lumpecotmy w/ sln bx's and bilateral breast reduction 11-28-2015/  left breast with reduction , dx invasive ductal ca, Grade 1 (pT1cpNX) ER/PR positive/  adjuvant radiation completed 03-27-2016 to right chest wall, 2018  reclassifiec Stage IIA   Cancer, metastatic to liver ([place])    Chronic back pain    Constipation    Depression    Family history of adverse reaction to anesthesia     mother has Copd was kept on vent    Headache    Heart murmur    History of hyperthyroidism    s/p  RAI   History of radiation therapy 02/11/16- 03/27/16   Right Chest Wall 50.4 Gy in 28 fractions, Right Chest Wall Boost 10 Gy in 5 fractions.    Hyperlipidemia    Hypertension    Hypothyroidism, postradioiodine therapy    endocrinoloigst-  dr balan--  2004  s/p  RAI   Menorrhagia 2011   Multiple pulmonary nodules    secondary  metastatic   PONV (postoperative nausea and vomiting)    patch helps    SUI (stress urinary incontinence, female)    Wears contact lenses    Wears dentures    full upper and lower partial    PAST SURGICAL HISTORY: Past Surgical History:  Procedure Laterality Date   BREAST LUMPECTOMY WITH NEEDLE LOCALIZATION AND AXILLARY SENTINEL LYMPH  NODE BX Right 11/28/2015   Procedure: RIGHT BREAST LUMPECTOMY WITH NEEDLE LOCALIZATION AND AXILLARY SENTINEL LYMPH NODE BX;  Surgeon: Autumn Messing III, MD;  Location: [place];  Service: General;  Laterality: Right;   BREAST REDUCTION SURGERY Bilateral 11/28/2015   Procedure: MAMMARY REDUCTION  (BREAST) BILATERAL;  Surgeon: Debbie Reese, MD;  Location: [place];  Service: Plastics;  Laterality: Bilateral;   CESAREAN SECTION  01/12/1993   COLONOSCOPY     Dental surgeries     FEMUR IM NAIL Left 01/28/2018   Procedure: INTRAMEDULLARY (IM) NAIL FEMORAL;  Surgeon: Nicholes Stairs, MD;  Location: [place];  Service: Orthopedics;  Laterality: Left;   HYSTEROSCOPY N/A 09/02/2017   Procedure: HYSTEROSCOPY  to remove IUD;  Surgeon: Eldred Manges, MD;  Location: [place];  Service: Gynecology;  Laterality: N/A;   Liver biospy     MASTECTOMY     MASTECTOMY W/ SENTINEL NODE BIOPSY Bilateral 12/26/2015   Procedure: BILATERAL MASTECTOMY WITH LEFT SENTINEL LYMPH NODE BIOPSY;  Surgeon: Autumn Messing III, MD;  Location: [place];  Service: General;  Laterality: Bilateral;   Removal of IUD     TUBAL LIGATION Bilateral 1995  FAMILY HISTORY: Family History  Problem Relation Age of Onset   Diabetes Mother    Hypertension Mother    Hypertension Father    Diabetes Maternal Grandmother    Hypertension Sister    Cancer Paternal Grandmother        colon   Colon cancer Paternal Grandmother    Esophageal cancer Neg Hx    Stomach cancer Neg Hx    Rectal cancer Neg Hx    The patient's parents are still living, in their early 31s as of June 2017. The patient has one brother, 2 sisters. On the maternal side there is a history of uterine and prostate cancer. On the paternal side there is a history of colon cancer and possibly uterine cancer. There is no history of breast or ovarian cancer in the family.    GYNECOLOGIC HISTORY:  No LMP recorded. (Menstrual status: IUD).  Menarche age  47. She still having regular periods. She is GX P1, first pregnancy age 56. She used oral contraceptives for a few years remotely, with no complications. She is currently on a Mirena IUD and also is status post bilateral tubal ligation.   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 Her husband Debbie [name] is a Administrator.  They are both taking appropriate precautions at work.  Their son Debbie [name], age 55, is a radio DJ.  He no longer lives with the patient.  The patient has no grandchildren. She is not a Ambulance person.   ADVANCED DIRECTIVES: Not in place   HEALTH MAINTENANCE: Social History   Tobacco Use   Smoking status: Never Smoker   Smokeless tobacco: Never Used  Substance Use Topics   Alcohol use: No   Drug use: No     Colonoscopy: January 2016   PAP: January 2016   Bone density: August 2017 showed a T score of +0.7 normal  Lipid panel:  Allergies  Allergen Reactions   Ace Inhibitors Cough   Atorvastatin Other (See Comments)    Joint pain   Simvastatin Other (See Comments)    Joint pain    Current Outpatient Medications  Medication Sig Dispense Refill   acetaminophen (TYLENOL) 325 MG tablet Take 325 mg by mouth every 6 (six) hours as needed for mild pain or headache.      AFINITOR 10 MG tablet TAKE 1 TABLET BY MOUTH DAILY. 28 tablet 2   anastrozole (ARIMIDEX) 1 MG tablet Take 1 tablet (1 mg total) by mouth daily. 90 tablet 4   Calcium-Magnesium 250-125 MG TABS Take 1 tablet by mouth daily. 120 each 4   cetirizine (ZYRTEC ALLERGY) 10 MG tablet Take 10 mg by mouth daily as needed for allergies.      dexamethasone (DECADRON) 0.5 MG/5ML solution Swish 72m in mouth for 268m and spit out. Use 4 times daily for 8 weeks, start with Afinitor. Avoid eating/drinking for 1hr after rinse. 500 mL 4   diclofenac sodium (VOLTAREN) 1 % GEL Apply 4 g topically 4 (four) times daily. 2 Tube 2   docusate sodium (COLACE) 100 MG  capsule Take 200-400 mg by mouth daily.     doxycycline (VIBRA-TABS) 100 MG tablet Take 1 tablet (100 mg total) by mouth daily. 30 tablet 2   Ferrous Sulfate (IRON) 325 (65 Fe) MG TABS Take 1 tablet (325 mg total) by mouth daily. 30 each 2   hydrocortisone cream 1 % Apply 1 application topically daily as needed (for rash).  ibuprofen (ADVIL,MOTRIN) 600 MG tablet Take 1 tablet (600 mg total) by mouth every 6 (six) hours as needed for moderate pain. 60 tablet 0   levothyroxine (SYNTHROID, LEVOTHROID) 137 MCG tablet Take 137 mcg by mouth daily before breakfast.      LIVALO 2 MG TABS Take 2 mg by mouth daily.   6   LORazepam (ATIVAN) 0.5 MG tablet Take 1 tablet (0.5 mg total) by mouth at bedtime as needed for anxiety. 30 tablet 0   losartan (COZAAR) 100 MG tablet Take 100 mg by mouth every morning.      Multiple Vitamin (MULTIVITAMIN) tablet Take 1 tablet by mouth daily.     omega-3 acid ethyl esters (LOVAZA) 1 g capsule Take 1 g by mouth 2 (two) times daily.      potassium chloride SA (K-DUR,KLOR-CON) 20 MEQ tablet Take 40 mEq by mouth daily.      pregabalin (LYRICA) 100 MG capsule TAKE 1 CAPSULE BY MOUTH 3 TIMES DAILY 90 capsule 2   prochlorperazine (COMPAZINE) 10 MG tablet Take 1 tablet (10 mg total) by mouth every 6 (six) hours as needed for nausea or vomiting. (Patient not taking: Reported on 04/01/2018) 30 tablet 0   triamterene-hydrochlorothiazide (DYAZIDE) 37.5-25 MG capsule Take 1 each (1 capsule total) by mouth daily. 90 capsule 4   Vitamin D, Cholecalciferol, 1000 units CAPS Take 1 tablet by mouth daily. (Patient taking differently: Take 1,000 Units by mouth daily. ) 90 capsule 4   No current facility-administered medications for this visit.     OBJECTIVE: Morbidly obese African-American [name] in no acute distress Vitals:   11/01/18 0815  BP: (!) 153/72  Pulse: 91  Resp: 17  Temp: 98.5 F (36.9 C)  SpO2: 100%     Body mass index is 42.56 kg/m.    ECOG FS:1 -  Symptomatic but completely ambulatory  Filed Weights   11/01/18 0815  Weight: 267 lb 11.2 oz (121.4 kg)   Sclerae unicteric, EOMs intact Wearing a mask No cervical or supraclavicular adenopathy Lungs no rales or rhonchi Heart regular rate and rhythm Abd soft, obese, nontender, positive bowel sounds MSK no focal spinal tenderness, grade 1 right upper extremity lymphedema Neuro: nonfocal, well oriented, appropriate affect Breasts: Status post bilateral mastectomies.  No evidence of chest wall recurrence Skin: Minimal scattered pimple-like rash across both sides of the upper chest  LAB RESULTS:  CMP     Component Value Date/Time   NA 142 11/01/2018 0742   NA 140 07/08/2016 0948   K 3.5 11/01/2018 0742   K 4.1 07/08/2016 0948   CL 107 11/01/2018 0742   CO2 23 11/01/2018 0742   CO2 24 07/08/2016 0948   GLUCOSE 119 (H) 11/01/2018 0742   GLUCOSE 89 07/08/2016 0948   BUN 10 11/01/2018 0742   BUN 11.9 07/08/2016 0948   CREATININE 0.93 11/01/2018 0742   CREATININE 0.9 07/08/2016 0948   CALCIUM 8.8 (L) 11/01/2018 0742   CALCIUM 10.0 07/08/2016 0948   PROT 7.1 11/01/2018 0742   PROT 7.6 07/08/2016 0948   ALBUMIN 3.2 (L) 11/01/2018 0742   ALBUMIN 3.6 07/08/2016 0948   AST 29 11/01/2018 0742   AST 22 07/08/2016 0948   ALT 35 11/01/2018 0742   ALT 27 07/08/2016 0948   ALKPHOS 137 (H) 11/01/2018 0742   ALKPHOS 115 07/08/2016 0948   BILITOT 0.3 11/01/2018 0742   BILITOT 0.35 07/08/2016 0948   GFRNONAA >60 11/01/2018 0742   GFRAA >60 11/01/2018 0742    INo  results found for: SPEP, UPEP  Lab Results  Component Value Date   WBC 4.4 11/01/2018   NEUTROABS 3.2 11/01/2018   HGB 10.8 (L) 11/01/2018   HCT 33.0 (L) 11/01/2018   MCV 84.8 11/01/2018   PLT 278 11/01/2018      Chemistry      Component Value Date/Time   NA 142 11/01/2018 0742   NA 140 07/08/2016 0948   K 3.5 11/01/2018 0742   K 4.1 07/08/2016 0948   CL 107 11/01/2018 0742   CO2 23 11/01/2018 0742   CO2 24  07/08/2016 0948   BUN 10 11/01/2018 0742   BUN 11.9 07/08/2016 0948   CREATININE 0.93 11/01/2018 0742   CREATININE 0.9 07/08/2016 0948      Component Value Date/Time   CALCIUM 8.8 (L) 11/01/2018 0742   CALCIUM 10.0 07/08/2016 0948   ALKPHOS 137 (H) 11/01/2018 0742   ALKPHOS 115 07/08/2016 0948   AST 29 11/01/2018 0742   AST 22 07/08/2016 0948   ALT 35 11/01/2018 0742   ALT 27 07/08/2016 0948   BILITOT 0.3 11/01/2018 0742   BILITOT 0.35 07/08/2016 0948       No results found for: LABCA2  No components found for: [id number]  No results for input(s): INR in the last 168 hours.  Urinalysis No results found for: COLORURINE, APPEARANCEUR, LABSPEC, PHURINE, GLUCOSEU, HGBUR, BILIRUBINUR, KETONESUR, PROTEINUR, UROBILINOGEN, NITRITE, LEUKOCYTESUR   STUDIES: No results found.   ELIGIBLE FOR AVAILABLE RESEARCH PROTOCOL: PALLAS, but inelegible because of Mirena IUD   ASSESSMENT: 53 y.o. Debbie [name] status post right breast upper inner quadrant biopsy 10/15/2015 for a clinical T2-T3 N0, stage II invasive ductal carcinoma, estrogen and progesterone receptor positive, HER-2 nonamplified, with an Mib-1 of 70%  (1) status post right lumpectomy and sentinel lymph node sampling with oncoplastic breast reduction 11/28/2015 for a pT2 pN1, stage IIB invasive ductal carcinoma, grade 1, with negative margins (1/1 sentinel node positive)  (a) reclassified as stage IIA in the 2018 prognostic revision   (2) left reduction mammoplasty 11/28/2015 unexpectedly found a pT1c pNX invasive ductal carcinoma, grade 1, estrogen and progesterone receptor positive, HER-2 not amplified, with an MIB-1 of 3%  (3) Mammaprint from the right sided breast cancer was "low risk", predicting a 98% chance of no disease recurrence within 5 years with anti-estrogens as the only systemic therapy. It also predicts minimal to no benefit from chemotherapy  (4) status post bilateral mastectomies with bilateral sentinel  lymph node sampling 12/26/2015, showing  (a) on the right, no residual carcinoma (0/3 nodes involved)  (b) on the left, focal ductal carcinoma in situ, 0.2 cm, with negative margins; (0/4 nodes involved)  (5) adjuvant radiation 02/11/16 - 03/27/16    1) Right chest wall: 50.4 Gy in 28 fractions               2) Right chest wall boost: 10 Gy in 5 fractions   (6) started on tamoxifen neoadjuvantly 10/23/2015 to allow for expected delays in definitive local treatment  (a) Mirena IUD in place  (b) tamoxifen discontinued 08/13/2017, with progression  (7) since she had 4 lymph nodes removed from each axilla but also receive radiation on the right, lab draws should preferentially be obtained from the left arm   METASTATIC DISEASE: April 2019 (8) CT scan of the abdomen and pelvis 08/04/2017, MRI of the liver and total spine MRI 08/11/2017 and CT scan of the chest and brain on 08/13/2017 showed liver and bone metastases  (a)  liver biopsy 09/07/2017 confirms estrogen receptor positive, progesterone receptor negative, HER-2 negative metastatic breast cancer  (b) chest CT scan 08/13/2017 shows multiple bilateral pulmonary nodules.  (c) CT of the brain with and without contrast 08/26/2017 shows no evidence of metastatic disease to the brain  (d) CA 27-29 is informative  (9) fulvestrant started 08/17/2017  (a) palbociclib started 08/31/2017  (b) fulvestrant and palbociclib discontinued 01/04/2018 with evidence of progression in the liver  (10) denosumab/Xgeva started 09/14/2017  (a) held after 03/01/2018 dose because concern re. osteonecrosis  (b) resumed 06/09/2018  (11) anastrozole started 01/04/2018  (a) everolimus started 01/29/2018  (b) liver MRI and bone scan December 2019 show stable disease  (c) chest CT scan on 08/02/2018 shows lung lesions resolved, bone lesions stable  (d) bone scan 09/13/2018 essentially stable  (e) liver MRI  (12) 01/28/18 prophylactic left femur intramedullary nail  surgery for impending left femur pathologic fracture  (a) palliative radiation with Dr. [name] to the left femur and left ilium from 02/16/2018 to 03/01/2018  (13) [facility] report on liver biopsy from April 2019 shows a negative PDL 1, stable MSI and proficient mismatch repair status.  The tumor is positive for the androgen receptor, and for PTEN for PIK3 CA mutations.  HER-2/neu was read as equivocal (it was negative by FISH on the pathology report).   PLAN: Debbie [name] is now a little over a year out from definitive diagnosis of metastatic breast cancer, with well-controlled disease.  She is tolerating the anastrozole and everolimus well and we are continuing these until there is evidence of disease progression.  She is also receiving monthly denosumab/Xgeva.  Her area of osteonecrosis or erosion is being followed closely by Dr. Wonda Horner.  We greatly appreciate his help in this regard  I have written her today for a compression sleeve for the right upper extremity.  In addition she brought up some billing concerns.  Apparently the last visit here was not appropriately coded.  We will work on that as well  I wrote her for doxycycline to take daily for the minimal impetigo she is experiencing  She was supposed to have had a restaging MRI of the liver prior to this visit.  It is not clear to me why that was not done.  We will try to get that done within the next week or so.  She will see her surgeon next month.  She will see me again in 2 months  She knows to call for any other issue that may develop before the next visit here. [place] Warriner, Debbie Dad, MD  11/01/18 8:37 AM Medical Oncology and Hematology [facility] [address] [place], [place] [address] Tel. [phone]    Fax. [phone]  I, Jacqualyn Posey am acting as a Education administrator for Chauncey Cruel, MD.   I, Lurline Del MD, have reviewed the above documentation for accuracy and completeness, and I agree with the above.

## 2018-11-01 ENCOUNTER — Inpatient Hospital Stay (HOSPITAL_BASED_OUTPATIENT_CLINIC_OR_DEPARTMENT_OTHER): Payer: 59 | Admitting: Oncology

## 2018-11-01 ENCOUNTER — Other Ambulatory Visit: Payer: Self-pay

## 2018-11-01 ENCOUNTER — Ambulatory Visit: Payer: 59

## 2018-11-01 ENCOUNTER — Inpatient Hospital Stay: Payer: 59

## 2018-11-01 ENCOUNTER — Inpatient Hospital Stay: Payer: 59 | Attending: Oncology

## 2018-11-01 VITALS — BP 153/72 | HR 91 | Temp 98.5°F | Resp 17 | Ht 66.5 in | Wt 267.7 lb

## 2018-11-01 DIAGNOSIS — C50112 Malignant neoplasm of central portion of left female breast: Secondary | ICD-10-CM

## 2018-11-01 DIAGNOSIS — C787 Secondary malignant neoplasm of liver and intrahepatic bile duct: Secondary | ICD-10-CM

## 2018-11-01 DIAGNOSIS — Z6841 Body Mass Index (BMI) 40.0 and over, adult: Secondary | ICD-10-CM

## 2018-11-01 DIAGNOSIS — Z170 Estrogen receptor positive status [ER+]: Secondary | ICD-10-CM | POA: Diagnosis not present

## 2018-11-01 DIAGNOSIS — C50012 Malignant neoplasm of nipple and areola, left female breast: Secondary | ICD-10-CM

## 2018-11-01 DIAGNOSIS — Z79811 Long term (current) use of aromatase inhibitors: Secondary | ICD-10-CM | POA: Insufficient documentation

## 2018-11-01 DIAGNOSIS — C50011 Malignant neoplasm of nipple and areola, right female breast: Secondary | ICD-10-CM

## 2018-11-01 DIAGNOSIS — L0100 Impetigo, unspecified: Secondary | ICD-10-CM | POA: Insufficient documentation

## 2018-11-01 DIAGNOSIS — Z9013 Acquired absence of bilateral breasts and nipples: Secondary | ICD-10-CM

## 2018-11-01 DIAGNOSIS — Z975 Presence of (intrauterine) contraceptive device: Secondary | ICD-10-CM | POA: Diagnosis not present

## 2018-11-01 DIAGNOSIS — C7951 Secondary malignant neoplasm of bone: Secondary | ICD-10-CM | POA: Diagnosis not present

## 2018-11-01 DIAGNOSIS — M879 Osteonecrosis, unspecified: Secondary | ICD-10-CM

## 2018-11-01 DIAGNOSIS — Z923 Personal history of irradiation: Secondary | ICD-10-CM | POA: Insufficient documentation

## 2018-11-01 DIAGNOSIS — C50211 Malignant neoplasm of upper-inner quadrant of right female breast: Secondary | ICD-10-CM

## 2018-11-01 LAB — CBC WITH DIFFERENTIAL/PLATELET
Abs Immature Granulocytes: 0.02 10*3/uL (ref 0.00–0.07)
Basophils Absolute: 0 10*3/uL (ref 0.0–0.1)
Basophils Relative: 1 %
Eosinophils Absolute: 0.1 10*3/uL (ref 0.0–0.5)
Eosinophils Relative: 3 %
HCT: 33 % — ABNORMAL LOW (ref 36.0–46.0)
Hemoglobin: 10.8 g/dL — ABNORMAL LOW (ref 12.0–15.0)
Immature Granulocytes: 1 %
Lymphocytes Relative: 12 %
Lymphs Abs: 0.5 10*3/uL — ABNORMAL LOW (ref 0.7–4.0)
MCH: 27.8 pg (ref 26.0–34.0)
MCHC: 32.7 g/dL (ref 30.0–36.0)
MCV: 84.8 fL (ref 80.0–100.0)
Monocytes Absolute: 0.5 10*3/uL (ref 0.1–1.0)
Monocytes Relative: 11 %
Neutro Abs: 3.2 10*3/uL (ref 1.7–7.7)
Neutrophils Relative %: 72 %
Platelets: 278 10*3/uL (ref 150–400)
RBC: 3.89 MIL/uL (ref 3.87–5.11)
RDW: 14.8 % (ref 11.5–15.5)
WBC: 4.4 10*3/uL (ref 4.0–10.5)
nRBC: 0 % (ref 0.0–0.2)

## 2018-11-01 LAB — CMP (CANCER CENTER ONLY)
ALT: 35 U/L (ref 0–44)
AST: 29 U/L (ref 15–41)
Albumin: 3.2 g/dL — ABNORMAL LOW (ref 3.5–5.0)
Alkaline Phosphatase: 137 U/L — ABNORMAL HIGH (ref 38–126)
Anion gap: 12 (ref 5–15)
BUN: 10 mg/dL (ref 6–20)
CO2: 23 mmol/L (ref 22–32)
Calcium: 8.8 mg/dL — ABNORMAL LOW (ref 8.9–10.3)
Chloride: 107 mmol/L (ref 98–111)
Creatinine: 0.93 mg/dL (ref 0.44–1.00)
GFR, Est AFR Am: >60 mL/min (ref >60)
GFR, Estimated: >60 mL/min (ref >60)
Glucose, Bld: 119 mg/dL — ABNORMAL HIGH (ref 70–99)
Potassium: 3.5 mmol/L (ref 3.5–5.1)
Sodium: 142 mmol/L (ref 135–145)
Total Bilirubin: 0.3 mg/dL (ref 0.3–1.2)
Total Protein: 7.1 g/dL (ref 6.5–8.1)

## 2018-11-01 MED ORDER — DENOSUMAB 120 MG/1.7ML ~~LOC~~ SOLN
120.0000 mg | Freq: Once | SUBCUTANEOUS | Status: AC
  Administered 2018-11-01: 120 mg via SUBCUTANEOUS

## 2018-11-01 MED ORDER — DENOSUMAB 120 MG/1.7ML ~~LOC~~ SOLN
SUBCUTANEOUS | Status: AC
  Filled 2018-11-01: qty 1.7

## 2018-11-01 MED ORDER — DOXYCYCLINE HYCLATE 100 MG PO TABS: 100.0000 mg | ORAL_TABLET | Freq: Every day | ORAL | 2 refills | Status: DC

## 2018-11-01 MED FILL — DOXYCYCLINE HYCLATE 100 MG: 100 | 30 days supply | Qty: 30 | Fill #0

## 2018-11-01 NOTE — Patient Instructions (Signed)

## 2018-11-08 DIAGNOSIS — I10 Essential (primary) hypertension: Secondary | ICD-10-CM | POA: Diagnosis not present

## 2018-11-08 DIAGNOSIS — E890 Postprocedural hypothyroidism: Secondary | ICD-10-CM | POA: Diagnosis not present

## 2018-11-08 DIAGNOSIS — R7301 Impaired fasting glucose: Secondary | ICD-10-CM | POA: Diagnosis not present

## 2018-11-15 DIAGNOSIS — D0510 Intraductal carcinoma in situ of unspecified breast: Secondary | ICD-10-CM | POA: Diagnosis not present

## 2018-11-15 DIAGNOSIS — E039 Hypothyroidism, unspecified: Secondary | ICD-10-CM | POA: Diagnosis not present

## 2018-11-15 DIAGNOSIS — J309 Allergic rhinitis, unspecified: Secondary | ICD-10-CM | POA: Diagnosis not present

## 2018-11-15 DIAGNOSIS — D0591 Unspecified type of carcinoma in situ of right breast: Secondary | ICD-10-CM | POA: Diagnosis not present

## 2018-11-15 DIAGNOSIS — R7301 Impaired fasting glucose: Secondary | ICD-10-CM | POA: Diagnosis not present

## 2018-11-15 DIAGNOSIS — E890 Postprocedural hypothyroidism: Secondary | ICD-10-CM | POA: Diagnosis not present

## 2018-11-15 DIAGNOSIS — I10 Essential (primary) hypertension: Secondary | ICD-10-CM | POA: Diagnosis not present

## 2018-11-16 MED FILL — PREGABALIN 100 MG CAPS: 100 | 30 days supply | Qty: 90 | Fill #2

## 2018-11-16 MED FILL — AFINITOR 10 MG TABLET: 10 | 28 days supply | Qty: 28 | Fill #1

## 2018-11-16 MED FILL — LIVALO 2 MG TABLET: 2 | 90 days supply | Qty: 90 | Fill #2

## 2018-11-16 MED FILL — LOSARTAN POTASSIUM 100 MG T: 100 | 30 days supply | Qty: 30 | Fill #5

## 2018-11-24 ENCOUNTER — Telehealth: Payer: Self-pay | Admitting: *Deleted

## 2018-11-24 ENCOUNTER — Other Ambulatory Visit: Payer: Self-pay | Admitting: *Deleted

## 2018-11-24 MED ORDER — FLUCONAZOLE 100 MG PO TABS: 100.0000 mg | ORAL_TABLET | Freq: Every day | ORAL | 0 refills | Status: DC

## 2018-11-24 MED FILL — FLUCONAZOLE 100 MG TAB: 100 | 5 days supply | Qty: 5 | Fill #0

## 2018-11-24 NOTE — Telephone Encounter (Signed)
See other entry 

## 2018-11-29 ENCOUNTER — Inpatient Hospital Stay: Payer: 59 | Attending: Oncology

## 2018-11-29 ENCOUNTER — Inpatient Hospital Stay: Payer: 59

## 2018-11-29 ENCOUNTER — Other Ambulatory Visit: Payer: Self-pay

## 2018-11-29 ENCOUNTER — Telehealth: Payer: Self-pay

## 2018-11-29 VITALS — BP 154/74 | HR 92 | Temp 98.5°F | Resp 18

## 2018-11-29 DIAGNOSIS — C50112 Malignant neoplasm of central portion of left female breast: Secondary | ICD-10-CM

## 2018-11-29 DIAGNOSIS — C7951 Secondary malignant neoplasm of bone: Secondary | ICD-10-CM | POA: Insufficient documentation

## 2018-11-29 DIAGNOSIS — C50011 Malignant neoplasm of nipple and areola, right female breast: Secondary | ICD-10-CM

## 2018-11-29 DIAGNOSIS — C50211 Malignant neoplasm of upper-inner quadrant of right female breast: Secondary | ICD-10-CM

## 2018-11-29 DIAGNOSIS — Z79899 Other long term (current) drug therapy: Secondary | ICD-10-CM | POA: Insufficient documentation

## 2018-11-29 DIAGNOSIS — Z170 Estrogen receptor positive status [ER+]: Secondary | ICD-10-CM

## 2018-11-29 DIAGNOSIS — C50012 Malignant neoplasm of nipple and areola, left female breast: Secondary | ICD-10-CM

## 2018-11-29 LAB — CBC WITH DIFFERENTIAL/PLATELET
Abs Immature Granulocytes: 0.02 10*3/uL (ref 0.00–0.07)
Basophils Absolute: 0 10*3/uL (ref 0.0–0.1)
Basophils Relative: 1 %
Eosinophils Absolute: 0.2 10*3/uL (ref 0.0–0.5)
Eosinophils Relative: 4 %
HCT: 35.4 % — ABNORMAL LOW (ref 36.0–46.0)
Hemoglobin: 11.5 g/dL — ABNORMAL LOW (ref 12.0–15.0)
Immature Granulocytes: 1 %
Lymphocytes Relative: 9 %
Lymphs Abs: 0.4 10*3/uL — ABNORMAL LOW (ref 0.7–4.0)
MCH: 27.8 pg (ref 26.0–34.0)
MCHC: 32.5 g/dL (ref 30.0–36.0)
MCV: 85.7 fL (ref 80.0–100.0)
Monocytes Absolute: 0.4 10*3/uL (ref 0.1–1.0)
Monocytes Relative: 9 %
Neutro Abs: 3.4 10*3/uL (ref 1.7–7.7)
Neutrophils Relative %: 76 %
Platelets: 326 10*3/uL (ref 150–400)
RBC: 4.13 MIL/uL (ref 3.87–5.11)
RDW: 14.6 % (ref 11.5–15.5)
WBC: 4.3 10*3/uL (ref 4.0–10.5)
nRBC: 0 % (ref 0.0–0.2)

## 2018-11-29 LAB — COMPREHENSIVE METABOLIC PANEL
ALT: 31 U/L (ref 0–44)
AST: 31 U/L (ref 15–41)
Albumin: 3.4 g/dL — ABNORMAL LOW (ref 3.5–5.0)
Alkaline Phosphatase: 151 U/L — ABNORMAL HIGH (ref 38–126)
Anion gap: 13 (ref 5–15)
BUN: 13 mg/dL (ref 6–20)
CO2: 24 mmol/L (ref 22–32)
Calcium: 9.6 mg/dL (ref 8.9–10.3)
Chloride: 106 mmol/L (ref 98–111)
Creatinine, Ser: 0.98 mg/dL (ref 0.44–1.00)
GFR calc Af Amer: >60 mL/min (ref >60)
GFR calc non Af Amer: >60 mL/min (ref >60)
Glucose, Bld: 117 mg/dL — ABNORMAL HIGH (ref 70–99)
Potassium: 3.2 mmol/L — ABNORMAL LOW (ref 3.5–5.1)
Sodium: 143 mmol/L (ref 135–145)
Total Bilirubin: 0.3 mg/dL (ref 0.3–1.2)
Total Protein: 7.6 g/dL (ref 6.5–8.1)

## 2018-11-29 MED ORDER — DENOSUMAB 120 MG/1.7ML ~~LOC~~ SOLN
SUBCUTANEOUS | Status: AC
  Filled 2018-11-29: qty 1.7

## 2018-11-29 MED ORDER — DENOSUMAB 120 MG/1.7ML ~~LOC~~ SOLN
120.0000 mg | Freq: Once | SUBCUTANEOUS | Status: AC
  Administered 2018-11-29: 120 mg via SUBCUTANEOUS

## 2018-11-29 NOTE — Telephone Encounter (Signed)
-----   Message from Gardenia Phlegm, NP sent at 11/29/2018  9:57 AM EDT ----- Please call patient.  Her potassium is low.  Is she taking potassium? ----- Message ----- From: Buel Ream, Lab [facility] [place] Sent: 11/29/2018   8:37 AM EDT To: Chauncey Cruel, MD

## 2018-11-29 NOTE — Telephone Encounter (Signed)
Spoke with patient to inform of low potassium level.  Nurse inquired if patient is taking any potassium.  Patient says that she is on potassium 20 meg 2 daily, however she has not taken it for several days.  She feels that this is why her level is low.  Patient stated "I will start back taking it and we'll see what happens next time."  No concerns at this time.

## 2018-11-30 LAB — CANCER ANTIGEN 27.29: CA 27.29: 32.7 U/mL (ref 0.0–38.6)

## 2018-12-06 DIAGNOSIS — C50912 Malignant neoplasm of unspecified site of left female breast: Secondary | ICD-10-CM | POA: Diagnosis not present

## 2018-12-06 DIAGNOSIS — C50911 Malignant neoplasm of unspecified site of right female breast: Secondary | ICD-10-CM | POA: Diagnosis not present

## 2018-12-07 ENCOUNTER — Other Ambulatory Visit: Payer: Self-pay

## 2018-12-07 ENCOUNTER — Ambulatory Visit (HOSPITAL_COMMUNITY)
Admission: RE | Admit: 2018-12-07 | Discharge: 2018-12-07 | Disposition: A | Discharge status: Home / Self Care | Payer: 59 | Source: Ambulatory Visit | Attending: Oncology | Admitting: Oncology

## 2018-12-07 DIAGNOSIS — C50112 Malignant neoplasm of central portion of left female breast: Secondary | ICD-10-CM | POA: Diagnosis not present

## 2018-12-07 DIAGNOSIS — C50211 Malignant neoplasm of upper-inner quadrant of right female breast: Secondary | ICD-10-CM | POA: Insufficient documentation

## 2018-12-07 DIAGNOSIS — C787 Secondary malignant neoplasm of liver and intrahepatic bile duct: Secondary | ICD-10-CM | POA: Insufficient documentation

## 2018-12-07 DIAGNOSIS — Z170 Estrogen receptor positive status [ER+]: Secondary | ICD-10-CM | POA: Insufficient documentation

## 2018-12-07 DIAGNOSIS — C50919 Malignant neoplasm of unspecified site of unspecified female breast: Secondary | ICD-10-CM | POA: Diagnosis not present

## 2018-12-07 MED ORDER — GADOBUTROL 1 MMOL/ML IV SOLN
10.0000 mL | Freq: Once | INTRAVENOUS | Status: AC | PRN
  Administered 2018-12-07: 10 mL via INTRAVENOUS

## 2018-12-08 ENCOUNTER — Encounter: Payer: Self-pay | Admitting: Physical Therapy

## 2018-12-08 ENCOUNTER — Ambulatory Visit: Payer: 59 | Attending: General Surgery | Admitting: Physical Therapy

## 2018-12-08 ENCOUNTER — Telehealth: Payer: Self-pay

## 2018-12-08 DIAGNOSIS — I972 Postmastectomy lymphedema syndrome: Secondary | ICD-10-CM | POA: Diagnosis not present

## 2018-12-08 DIAGNOSIS — R293 Abnormal posture: Secondary | ICD-10-CM | POA: Insufficient documentation

## 2018-12-08 NOTE — Telephone Encounter (Signed)
RN left message for patient to call back.  Re: MRI

## 2018-12-08 NOTE — Therapy (Signed)
[place], [place], [address] Phone: [phone]   Fax:  [phone]  Physical Therapy Evaluation  Patient Details  Name: Debbie [name] MRN: [id number] Date of Birth: 1965-08-02 Referring Provider (PT): Dr. Marlou Starks   Encounter Date: 12/08/2018  PT End of Session - 12/08/18 1652    Visit Number  1    Number of Visits  9    Date for PT Re-Evaluation  01/19/19    PT Start Time  1600    PT Stop Time  1645    PT Time Calculation (min)  45 min    Activity Tolerance  Patient tolerated treatment well    Behavior During Therapy  [facility] for tasks assessed/performed       Past Medical History:  Diagnosis Date  . Anxiety   . Arthritis   . Bone metastasis ([place])   . Breast cancer of upper-inner quadrant of right female breast [facility]) oncologist-  dr [name]--- 04/ 2019 ER/PR+ Stage IV w/ metastatic disease to liver, total spine, lung, bone   dx 06/ 2017  right breast invasive ductal carcinoma, Stage IIB, ER/PR + (pT2 pN1), grade 1-- s/p  right breast lumpecotmy w/ sln bx's and bilateral breast reduction 11-28-2015/  left breast with reduction , dx invasive ductal ca, Grade 1 (pT1cpNX) ER/PR positive/  adjuvant radiation completed 03-27-2016 to right chest wall, 2018  reclassifiec Stage IIA  . Cancer, metastatic to liver ([place])   . Chronic back pain   . Constipation   . Depression   . Family history of adverse reaction to anesthesia     mother has Copd was kept on vent   . Headache   . Heart murmur   . History of hyperthyroidism    s/p  RAI  . History of radiation therapy 02/11/16- 03/27/16   Right Chest Wall 50.4 Gy in 28 fractions, Right Chest Wall Boost 10 Gy in 5 fractions.   . Hyperlipidemia   . Hypertension   . Hypothyroidism, postradioiodine therapy    endocrinoloigst-  dr balan--  2004  s/p  RAI  . Menorrhagia 2011  . Multiple pulmonary nodules    secondary  metastatic  . PONV (postoperative nausea  and vomiting)    patch helps   . SUI (stress urinary incontinence, female)   . Wears contact lenses   . Wears dentures    full upper and lower partial    Past Surgical History:  Procedure Laterality Date  . BREAST LUMPECTOMY WITH NEEDLE LOCALIZATION AND AXILLARY SENTINEL LYMPH NODE BX Right 11/28/2015   Procedure: RIGHT BREAST LUMPECTOMY WITH NEEDLE LOCALIZATION AND AXILLARY SENTINEL LYMPH NODE BX;  Surgeon: Autumn Messing III, MD;  Location: [place];  Service: General;  Laterality: Right;  . BREAST REDUCTION SURGERY Bilateral 11/28/2015   Procedure: MAMMARY REDUCTION  (BREAST) BILATERAL;  Surgeon: Crissie Reese, MD;  Location: [place];  Service: Plastics;  Laterality: Bilateral;  . CESAREAN SECTION  01/12/1993  . COLONOSCOPY    . Dental surgeries    . FEMUR IM NAIL Left 01/28/2018   Procedure: INTRAMEDULLARY (IM) NAIL FEMORAL;  Surgeon: Nicholes Stairs, MD;  Location: [place];  Service: Orthopedics;  Laterality: Left;  . HYSTEROSCOPY N/A 09/02/2017   Procedure: HYSTEROSCOPY  to remove IUD;  Surgeon: Eldred Manges, MD;  Location[facility];  Service: Gynecology;  Laterality: N/A;  . Liver biospy    . MASTECTOMY    . MASTECTOMY W/ SENTINEL NODE BIOPSY Bilateral 12/26/2015  Procedure: BILATERAL MASTECTOMY WITH LEFT SENTINEL LYMPH NODE BIOPSY;  Surgeon: Autumn Messing III, MD;  Location: [place];  Service: General;  Laterality: Bilateral;  . Removal of IUD    . TUBAL LIGATION Bilateral 1995    There were no vitals filed for this visit.   Subjective Assessment - 12/08/18 1607    Subjective  I noticed the swelling in my arm about 2 months ago. It used to go away and now it does not go away. As the day progresses it gets worse and it gets worse if I walk for a period of time.    Pertinent History  11/26/15- right lumpectomy, SLNB, and breast reduction and sent tissue from left breast for biopsy, 12/26/15- bilateral mastectomy and SLNB on L, pt completed radiation 08/12/17- pt diagnosed  with stage 4 breast cancer in bone, liver and lung, pt is currently undergoing chemotherapy, Oct 2019- pt had to undergo L medullary femur nailing due to impending pathologic fracture from bone mets- completed PT for 2 weeks, 2 weeks of radiation to femur, pt developed neuropathy of bilateral LEs in Dec 2019    Patient Stated Goals  to get swelling down in arm and get it controlled    Currently in Pain?  No/denies         [facility] PT Assessment - 12/08/18 0001      Assessment   Medical Diagnosis  left and right breast cancer    Referring Provider (PT)  Dr. Marlou Starks    Onset Date/Surgical Date  11/26/15    Hand Dominance  Left    Prior Therapy  2019 PT for L femur nailing      Precautions   Precautions  Other (comment)    Precaution Comments  lymphedema      Restrictions   Weight Bearing Restrictions  No      Balance Screen   Has the patient fallen in the past 6 months  No    Has the patient had a decrease in activity level because of a fear of falling?   No    Is the patient reluctant to leave their home because of a fear of falling?   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 [place]  pt tries to walk when she is not having any pain      Cognition   Overall Cognitive Status  Within Functional Limits for tasks assessed      Observation/Other Assessments   Observations  RUE visibly swollen      ROM / Strength   AROM / PROM / Strength  AROM      AROM   Overall AROM   Within functional limits for tasks performed    AROM Assessment Site  Shoulder        LYMPHEDEMA/ONCOLOGY QUESTIONNAIRE - 12/08/18 1628      Type   Cancer Type  left and right breast cancer       Surgeries   Mastectomy Date  12/26/15    Lumpectomy Date  11/26/15    Sentinel Lymph Node Biopsy Date  11/26/15  Number Lymph Nodes Removed  --   pt is unsure     Date Lymphedema/Swelling Started   Date  09/09/18      Treatment   Active Chemotherapy Treatment  Yes    Past Chemotherapy Treatment  Yes    Active Radiation Treatment  No    Past Radiation Treatment  Yes    Body Site  L femur, R chest    Current Hormone Treatment  Yes    Drug Name  anastrozole    Past Hormone Therapy  No      What other symptoms do you have   Are you Having Heaviness or Tightness  Yes    Are you having Pain  Yes    Are you having pitting edema  No    Is it Hard or Difficult finding clothes that fit  No    Do you have infections  No    Is there Decreased scar mobility  No      Lymphedema Assessments   Lymphedema Assessments  Upper extremities      Right Upper Extremity Lymphedema   15 cm Proximal to Olecranon Process  38.6 cm    Olecranon Process  32.5 cm    15 cm Proximal to Ulnar Styloid Process  31 cm    Just Proximal to Ulnar Styloid Process  20.2 cm    Across Hand at [employer]  21 cm    At [place] of 2nd Digit  7.5 cm      Left Upper Extremity Lymphedema   15 cm Proximal to Olecranon Process  37.5 cm    Olecranon Process  29.5 cm    15 cm Proximal to Ulnar Styloid Process  28 cm    Just Proximal to Ulnar Styloid Process  17.5 cm    Across Hand at [employer]  20.5 cm    At [place] of 2nd Digit  6.5 cm             Outpatient Rehab from 12/08/2018 [facility]  Lymphedema Life Impact Scale Total Score  11.76 %      Objective measurements completed on examination: See above findings.              PT Education - 12/08/18 1651    Education Details  anatomy and physiology of lymphatic system, treatment for lymphedema (bandaging, MLD and compression garment)    Person(s) Educated  Patient    Methods  Explanation     Comprehension  Verbalized understanding          PT Long Term Goals - 12/08/18 1657      PT LONG TERM GOAL #1   Title  Pt will demonstrate maximal reduction of edema in RUE as evidenced by stabilization of circumferential measurements    Time  6    Period  Weeks    Status  New    Target Date  01/19/19      PT LONG TERM GOAL #2   Title  Pt will obtain appropriate compression garments for long term management of RUE lymphedema    Time  6    Period  Weeks    Status  New    Target Date  01/19/19      PT LONG TERM GOAL #3   Title  Pt will be independent in self MLD for long term management of lymphedema    Time  4    Period  Weeks  Status  New    Target Date  01/19/19      PT LONG TERM GOAL #4   Title  Pt's husband will be able to independently don pt's compression bandages to help pt reach maximal reduction    Time  6    Period  Weeks    Status  New    Target Date  01/19/19      PT LONG TERM GOAL #5   Title  Pt will be able to independently verbalize lymphedema risk reduction practices    Time  6    Period  Weeks    Status  New    Target Date  01/19/19             Plan - 12/08/18 1652    Clinical Impression Statement  Pt presents to PT with RUE lymphedema that began two months ago. Pt has a history of right and left breast cancer in 2017 and underwent a right lumpectomy with SLNB followed by bilateral mastectomies with L SLNB. Last year she was found to have metastatic disease in her bones and liver. She has to undergo a left femur pinning due to impending fracture secondary to metastatic disease. Pt would benefit from skilled PT services to decrease R UE lymphedema through bandaging and MLD and assist pt with obtaining appropriate compression garments.    Personal Factors and Comorbidities  Comorbidity 1;Comorbidity 2    Comorbidities  metastatic disease, hx of radiation, neuropathy    Examination-Participation Restrictions  Other   grasping with fingers due to  swelling   Stability/Clinical Decision Making  Evolving/Moderate complexity    Clinical Decision Making  Moderate    Rehab Potential  Good    PT Frequency  2x / week    PT Duration  4 weeks    PT Treatment/Interventions  ADLs/Self Care Home Management;Manual techniques;Manual lymph drainage;Compression bandaging;Taping;Vasopneumatic Device;Patient/family education;Therapeutic exercise    PT Next Visit Plan  begin MLD and bandaging to RUE, instruct pt's husband in how to apply bandages as pt will only be coming 2x/wk, instruct in lymphedema risk reduction practices    Consulted and Agree with Plan of Care  Patient       Patient will benefit from skilled therapeutic intervention in order to improve the following deficits and impairments:  Postural dysfunction, Increased edema, Decreased knowledge of precautions  Visit Diagnosis: 1. Postmastectomy lymphedema   2. Abnormal posture        Problem List Patient Active Problem List   Diagnosis Date Noted  . Osteonecrosis ([place]) 11/01/2018  . Pathological fracture in neoplastic disease, left femur, initial encounter for fracture ([place]) 01/28/2018  . Morbid obesity with BMI of 40.0-44.9, adult ([place]) 10/12/2017  . IUD complication ([place]) 93/79/0240  . Bone metastases ([place]) 08/13/2017  . Pain from bone metastases ([place]) 08/13/2017  . Liver metastases ([place]) 08/13/2017  . Steatosis of liver 08/13/2017  . Bilateral breast cancer ([place]) 12/26/2015  . Malignant neoplasm of central portion of left breast in female, estrogen receptor positive ([place]) 11/28/2015  . Malignant neoplasm of upper-inner quadrant of right breast in female, estrogen receptor positive ([place]) 10/17/2015  . Hypertension 04/19/2012  . Hypothyroid 04/19/2012  . Fibroids 04/19/2012  . Stress incontinence, female 04/19/2012    Allyson Sabal [facility] 12/08/2018, 5:01 PM  [place], [place],  [address] Phone: [phone]   Fax:  [phone]  Name: Debbie [name] MRN: [id number] Date of Birth: 07-08-1965   Hilaria Ota  [place], PT 12/08/18 5:01 PM

## 2018-12-13 ENCOUNTER — Ambulatory Visit: Payer: 59

## 2018-12-13 ENCOUNTER — Other Ambulatory Visit: Payer: Self-pay

## 2018-12-13 DIAGNOSIS — R293 Abnormal posture: Secondary | ICD-10-CM | POA: Insufficient documentation

## 2018-12-13 DIAGNOSIS — R918 Other nonspecific abnormal finding of lung field: Secondary | ICD-10-CM | POA: Diagnosis not present

## 2018-12-13 DIAGNOSIS — C50211 Malignant neoplasm of upper-inner quadrant of right female breast: Secondary | ICD-10-CM | POA: Diagnosis not present

## 2018-12-13 DIAGNOSIS — Z170 Estrogen receptor positive status [ER+]: Secondary | ICD-10-CM | POA: Diagnosis not present

## 2018-12-13 DIAGNOSIS — C787 Secondary malignant neoplasm of liver and intrahepatic bile duct: Secondary | ICD-10-CM | POA: Diagnosis not present

## 2018-12-13 DIAGNOSIS — I972 Postmastectomy lymphedema syndrome: Secondary | ICD-10-CM

## 2018-12-13 DIAGNOSIS — C7951 Secondary malignant neoplasm of bone: Secondary | ICD-10-CM | POA: Diagnosis not present

## 2018-12-13 DIAGNOSIS — Z79811 Long term (current) use of aromatase inhibitors: Secondary | ICD-10-CM | POA: Diagnosis not present

## 2018-12-13 DIAGNOSIS — Z9013 Acquired absence of bilateral breasts and nipples: Secondary | ICD-10-CM | POA: Diagnosis not present

## 2018-12-13 MED FILL — ANASTROZOLE 1 MG TABLET: 1 | 90 days supply | Qty: 90 | Fill #4

## 2018-12-13 NOTE — Therapy (Signed)
[place], [place], [address] Phone: [phone]   Fax:  [phone]  Physical Therapy Treatment  Patient Details  Name: Debbie [name] MRN: [id number] Date of Birth: 1965-10-29 Referring Provider (PT): Dr. Marlou Starks   Encounter Date: 12/13/2018  PT End of Session - 12/13/18 0901    Visit Number  2    Number of Visits  9    Date for PT Re-Evaluation  01/19/19    PT Start Time  0803    PT Stop Time  6301    PT Time Calculation (min)  52 min    Activity Tolerance  Patient tolerated treatment well    Behavior During Therapy  [facility] for tasks assessed/performed       Past Medical History:  Diagnosis Date  . Anxiety   . Arthritis   . Bone metastasis ([place])   . Breast cancer of upper-inner quadrant of right female breast [facility]) oncologist-  dr [name]--- 04/ 2019 ER/PR+ Stage IV w/ metastatic disease to liver, total spine, lung, bone   dx 06/ 2017  right breast invasive ductal carcinoma, Stage IIB, ER/PR + (pT2 pN1), grade 1-- s/p  right breast lumpecotmy w/ sln bx's and bilateral breast reduction 11-28-2015/  left breast with reduction , dx invasive ductal ca, Grade 1 (pT1cpNX) ER/PR positive/  adjuvant radiation completed 03-27-2016 to right chest wall, 2018  reclassifiec Stage IIA  . Cancer, metastatic to liver ([place])   . Chronic back pain   . Constipation   . Depression   . Family history of adverse reaction to anesthesia     mother has Copd was kept on vent   . Headache   . Heart murmur   . History of hyperthyroidism    s/p  RAI  . History of radiation therapy 02/11/16- 03/27/16   Right Chest Wall 50.4 Gy in 28 fractions, Right Chest Wall Boost 10 Gy in 5 fractions.   . Hyperlipidemia   . Hypertension   . Hypothyroidism, postradioiodine therapy    endocrinoloigst-  dr balan--  2004  s/p  RAI  . Menorrhagia 2011  . Multiple pulmonary nodules    secondary  metastatic  . PONV (postoperative nausea  and vomiting)    patch helps   . SUI (stress urinary incontinence, female)   . Wears contact lenses   . Wears dentures    full upper and lower partial    Past Surgical History:  Procedure Laterality Date  . BREAST LUMPECTOMY WITH NEEDLE LOCALIZATION AND AXILLARY SENTINEL LYMPH NODE BX Right 11/28/2015   Procedure: RIGHT BREAST LUMPECTOMY WITH NEEDLE LOCALIZATION AND AXILLARY SENTINEL LYMPH NODE BX;  Surgeon: Autumn Messing III, MD;  Location: [place];  Service: General;  Laterality: Right;  . BREAST REDUCTION SURGERY Bilateral 11/28/2015   Procedure: MAMMARY REDUCTION  (BREAST) BILATERAL;  Surgeon: Crissie Reese, MD;  Location: [place];  Service: Plastics;  Laterality: Bilateral;  . CESAREAN SECTION  01/12/1993  . COLONOSCOPY    . Dental surgeries    . FEMUR IM NAIL Left 01/28/2018   Procedure: INTRAMEDULLARY (IM) NAIL FEMORAL;  Surgeon: Nicholes Stairs, MD;  Location: [place];  Service: Orthopedics;  Laterality: Left;  . HYSTEROSCOPY N/A 09/02/2017   Procedure: HYSTEROSCOPY  to remove IUD;  Surgeon: Eldred Manges, MD;  Location: [facility];  Service: Gynecology;  Laterality: N/A;  . Liver biospy    . MASTECTOMY    . MASTECTOMY W/ SENTINEL NODE BIOPSY Bilateral 12/26/2015  Procedure: BILATERAL MASTECTOMY WITH LEFT SENTINEL LYMPH NODE BIOPSY;  Surgeon: Autumn Messing III, MD;  Location: [place];  Service: General;  Laterality: Bilateral;  . Removal of IUD    . TUBAL LIGATION Bilateral 1995    There were no vitals filed for this visit.  Subjective Assessment - 12/13/18 0806    Subjective  I brought my husband to learn how to do the bandaging.    Pertinent History  11/26/15- right lumpectomy, SLNB, and breast reduction and sent tissue from left breast for biopsy, 12/26/15- bilateral mastectomy and SLNB on L, pt completed radiation 08/12/17- pt diagnosed with stage 4 breast cancer in bone, liver and lung, pt is currently undergoing chemotherapy, Oct 2019- pt had to undergo L medullary  femur nailing due to impending pathologic fracture from bone mets- completed PT for 2 weeks, 2 weeks of radiation to femur, pt developed neuropathy of bilateral LEs in Dec 2019    Patient Stated Goals  to get swelling down in arm and get it controlled    Currently in Pain?  No/denies                  Outpatient Rehab from 12/08/2018 [facility]  Lymphedema Life Impact Scale Total Score  11.76 %           [facility] Adult PT Treatment/Exercise - 12/13/18 0001      Manual Therapy   Manual Therapy  Manual Lymphatic Drainage (MLD);Compression Bandaging    Manual Lymphatic Drainage (MLD)  In Supine: Short neck, Rt inguinal nodes, Rt axillo-inguinal anastomosis, then Rt UE working from lateral upper arm to dorsal hand proximally to distal then retracing all steps and instructing pt and husband throughout.    Compression Bandaging  Thick lotion, thin stockinette, Elastomull to fingers 1-4, Artiflex x1, and 1-6, 1-10 and 1-12 cm short stretch compression bandages from hand to axilla instructing pt and husband througout and husband recording with pts cell phone.              PT Education - 12/13/18 0900    Education Details  Began instruction verbally of manual lymph drainage and compression bandaging with husband videorecording with pts phone for home use.    Person(s) Educated  Patient;Spouse    Methods  Explanation;Demonstration;Handout;Other (comment)   husband recorded with phone   Comprehension  Verbalized understanding;Need further instruction          PT Long Term Goals - 12/08/18 1657      PT LONG TERM GOAL #1   Title  Pt will demonstrate maximal reduction of edema in RUE as evidenced by stabilization of circumferential measurements    Time  6    Period  Weeks    Status  New    Target Date  01/19/19      PT LONG TERM GOAL #2   Title  Pt will obtain appropriate compression garments for long term management of RUE lymphedema     Time  6    Period  Weeks    Status  New    Target Date  01/19/19      PT LONG TERM GOAL #3   Title  Pt will be independent in self MLD for long term management of lymphedema    Time  4    Period  Weeks    Status  New    Target Date  01/19/19      PT LONG TERM GOAL #4   Title  Pt's  husband will be able to independently don pt's compression bandages to help pt reach maximal reduction    Time  6    Period  Weeks    Status  New    Target Date  01/19/19      PT LONG TERM GOAL #5   Title  Pt will be able to independently verbalize lymphedema risk reduction practices    Time  6    Period  Weeks    Status  New    Target Date  01/19/19            Plan - 12/13/18 0901    Clinical Impression Statement  Pt and hsuband very engaged today and asking appropriate questions througout while being instructed in compression bandaging. Issued handout and husband recorded therapist bandaging. Also began instructing pt in MLD while performing today. She tolerated all well and reports banadge being comfortable at end of session.    Personal Factors and Comorbidities  Comorbidity 1;Comorbidity 2    Comorbidities  metastatic disease, hx of radiation, neuropathy    Examination-Participation Restrictions  Other   grasping with fingers due to tswelling   Stability/Clinical Decision Making  Evolving/Moderate complexity    Rehab Potential  Good    PT Frequency  2x / week    PT Duration  4 weeks    PT Treatment/Interventions  ADLs/Self Care Home Management;Manual techniques;Manual lymph drainage;Compression bandaging;Taping;Vasopneumatic Device;Patient/family education;Therapeutic exercise    PT Next Visit Plan  Cont with complete decongestive therapy and issue handout for self MLD for pt to try this over weekend when her bandages are off. Instruct in lymphedema risk reductions    PT Home Exercise Plan  compression bandaging    Consulted and Agree with Plan of Care  Patient;Family member/caregiver     Family Member Consulted  Husband       Patient will benefit from skilled therapeutic intervention in order to improve the following deficits and impairments:  Postural dysfunction, Increased edema, Decreased knowledge of precautions  Visit Diagnosis: 1. Postmastectomy lymphedema   2. Abnormal posture        Problem List Patient Active Problem List   Diagnosis Date Noted  . Osteonecrosis ([place]) 11/01/2018  . Pathological fracture in neoplastic disease, left femur, initial encounter for fracture ([place]) 01/28/2018  . Morbid obesity with BMI of 40.0-44.9, adult ([place]) 10/12/2017  . IUD complication ([place]) 93/26/7124  . Bone metastases ([place]) 08/13/2017  . Pain from bone metastases ([place]) 08/13/2017  . Liver metastases ([place]) 08/13/2017  . Steatosis of liver 08/13/2017  . Bilateral breast cancer ([place]) 12/26/2015  . Malignant neoplasm of central portion of left breast in female, estrogen receptor positive ([place]) 11/28/2015  . Malignant neoplasm of upper-inner quadrant of right breast in female, estrogen receptor positive ([place]) 10/17/2015  . Hypertension 04/19/2012  . Hypothyroid 04/19/2012  . Fibroids 04/19/2012  . Stress incontinence, female 04/19/2012    Otelia Limes, PTA 12/13/2018, 9:04 AM  [place], [place], [address] Phone: [phone]   Fax:  [phone]  Name: Debbie [name] MRN: [id number] Date of Birth: 07/23/1965

## 2018-12-15 ENCOUNTER — Other Ambulatory Visit: Payer: Self-pay

## 2018-12-15 ENCOUNTER — Ambulatory Visit: Payer: 59

## 2018-12-15 DIAGNOSIS — R293 Abnormal posture: Secondary | ICD-10-CM

## 2018-12-15 DIAGNOSIS — C50211 Malignant neoplasm of upper-inner quadrant of right female breast: Secondary | ICD-10-CM | POA: Diagnosis not present

## 2018-12-15 DIAGNOSIS — R918 Other nonspecific abnormal finding of lung field: Secondary | ICD-10-CM | POA: Diagnosis not present

## 2018-12-15 DIAGNOSIS — C7951 Secondary malignant neoplasm of bone: Secondary | ICD-10-CM | POA: Diagnosis not present

## 2018-12-15 DIAGNOSIS — C787 Secondary malignant neoplasm of liver and intrahepatic bile duct: Secondary | ICD-10-CM | POA: Diagnosis not present

## 2018-12-15 DIAGNOSIS — Z79811 Long term (current) use of aromatase inhibitors: Secondary | ICD-10-CM | POA: Diagnosis not present

## 2018-12-15 DIAGNOSIS — Z170 Estrogen receptor positive status [ER+]: Secondary | ICD-10-CM | POA: Diagnosis not present

## 2018-12-15 DIAGNOSIS — Z9013 Acquired absence of bilateral breasts and nipples: Secondary | ICD-10-CM | POA: Diagnosis not present

## 2018-12-15 DIAGNOSIS — H524 Presbyopia: Secondary | ICD-10-CM | POA: Diagnosis not present

## 2018-12-15 DIAGNOSIS — I972 Postmastectomy lymphedema syndrome: Secondary | ICD-10-CM

## 2018-12-15 MED FILL — AFINITOR 10 MG TABLET: 10 | 28 days supply | Qty: 28 | Fill #2

## 2018-12-15 NOTE — Therapy (Signed)
[place], [place], [address] Phone: [phone]   Fax:  [phone]  Physical Therapy Treatment  Patient Details  Name: Debbie [name] MRN: [id number] Date of Birth: 1966/03/17 Referring Provider (PT): Dr. Marlou Starks   Encounter Date: 12/15/2018  PT End of Session - 12/15/18 0855    Visit Number  3    Number of Visits  9    Date for PT Re-Evaluation  01/19/19    PT Start Time  0802    PT Stop Time  0852    PT Time Calculation (min)  50 min    Activity Tolerance  Patient tolerated treatment well    Behavior During Therapy  [facility] for tasks assessed/performed       Past Medical History:  Diagnosis Date  . Anxiety   . Arthritis   . Bone metastasis ([place])   . Breast cancer of upper-inner quadrant of right female breast [facility]) oncologist-  dr [name]--- 04/ 2019 ER/PR+ Stage IV w/ metastatic disease to liver, total spine, lung, bone   dx 06/ 2017  right breast invasive ductal carcinoma, Stage IIB, ER/PR + (pT2 pN1), grade 1-- s/p  right breast lumpecotmy w/ sln bx's and bilateral breast reduction 11-28-2015/  left breast with reduction , dx invasive ductal ca, Grade 1 (pT1cpNX) ER/PR positive/  adjuvant radiation completed 03-27-2016 to right chest wall, 2018  reclassifiec Stage IIA  . Cancer, metastatic to liver ([place])   . Chronic back pain   . Constipation   . Depression   . Family history of adverse reaction to anesthesia     mother has Copd was kept on vent   . Headache   . Heart murmur   . History of hyperthyroidism    s/p  RAI  . History of radiation therapy 02/11/16- 03/27/16   Right Chest Wall 50.4 Gy in 28 fractions, Right Chest Wall Boost 10 Gy in 5 fractions.   . Hyperlipidemia   . Hypertension   . Hypothyroidism, postradioiodine therapy    endocrinoloigst-  dr balan--  2004  s/p  RAI  . Menorrhagia 2011  . Multiple pulmonary nodules    secondary  metastatic  . PONV (postoperative nausea  and vomiting)    patch helps   . SUI (stress urinary incontinence, female)   . Wears contact lenses   . Wears dentures    full upper and lower partial    Past Surgical History:  Procedure Laterality Date  . BREAST LUMPECTOMY WITH NEEDLE LOCALIZATION AND AXILLARY SENTINEL LYMPH NODE BX Right 11/28/2015   Procedure: RIGHT BREAST LUMPECTOMY WITH NEEDLE LOCALIZATION AND AXILLARY SENTINEL LYMPH NODE BX;  Surgeon: Autumn Messing III, MD;  Location: [place];  Service: General;  Laterality: Right;  . BREAST REDUCTION SURGERY Bilateral 11/28/2015   Procedure: MAMMARY REDUCTION  (BREAST) BILATERAL;  Surgeon: Crissie Reese, MD;  Location: [place];  Service: Plastics;  Laterality: Bilateral;  . CESAREAN SECTION  01/12/1993  . COLONOSCOPY    . Dental surgeries    . FEMUR IM NAIL Left 01/28/2018   Procedure: INTRAMEDULLARY (IM) NAIL FEMORAL;  Surgeon: Nicholes Stairs, MD;  Location: [place];  Service: Orthopedics;  Laterality: Left;  . HYSTEROSCOPY N/A 09/02/2017   Procedure: HYSTEROSCOPY  to remove IUD;  Surgeon: Eldred Manges, MD;  Location: [facility];  Service: Gynecology;  Laterality: N/A;  . Liver biospy    . MASTECTOMY    . MASTECTOMY W/ SENTINEL NODE BIOPSY Bilateral 12/26/2015  Procedure: BILATERAL MASTECTOMY WITH LEFT SENTINEL LYMPH NODE BIOPSY;  Surgeon: Autumn Messing III, MD;  Location: [place];  Service: General;  Laterality: Bilateral;  . Removal of IUD    . TUBAL LIGATION Bilateral 1995    There were no vitals filed for this visit.  Subjective Assessment - 12/15/18 0806    Subjective  I had to take off the bandages off yesterday and rewrapped it watching the video. My fingers got pulled off during the night and didn't realize it. My wrist and hand looked really good when they came off though!    Pertinent History  11/26/15- right lumpectomy, SLNB, and breast reduction and sent tissue from left breast for biopsy, 12/26/15- bilateral mastectomy and SLNB on L, pt completed radiation  08/12/17- pt diagnosed with stage 4 breast cancer in bone, liver and lung, pt is currently undergoing chemotherapy, Oct 2019- pt had to undergo L medullary femur nailing due to impending pathologic fracture from bone mets- completed PT for 2 weeks, 2 weeks of radiation to femur, pt developed neuropathy of bilateral LEs in Dec 2019    Patient Stated Goals  to get swelling down in arm and get it controlled    Currently in Pain?  No/denies            LYMPHEDEMA/ONCOLOGY QUESTIONNAIRE - 12/15/18 0811      Right Upper Extremity Lymphedema   15 cm Proximal to Olecranon Process  38.8 cm    Olecranon Process  29.4 cm    15 cm Proximal to Ulnar Styloid Process  29.7 cm    Just Proximal to Ulnar Styloid Process  18.9 cm    Across Hand at [employer]  20.5 cm    At [place] of 2nd Digit  7.1 cm           Outpatient Re[facility]  Lymphedema Life Impact Scale Total Score  11.76 %           [facility] Adult PT Treatment/Exercise - 12/15/18 0001      Manual Therapy   Manual Therapy  Manual Lymphatic Drainage (MLD)    Manual Lymphatic Drainage (MLD)  In Supine: Short neck, Rt inguinal nodes, Rt axillo-inguinal anastomosis, then Rt UE working from lateral upper arm to dorsal hand proximally to distal then retracing all steps    Compression Bandaging  Thick lotion, thick stockinette, Elastomull to fingers 1-4 fixated with paper tape, Artiflex x1, and 1-6, 1-10 and 1-12 cm short stretch compression bandages from hand to axilla reviewing with pt throughout                  PT Long Term Goals - 12/08/18 1657      PT LONG TERM GOAL #1   Title  Pt will demonstrate maximal reduction of edema in RUE as evidenced by stabilization of circumferential measurements    Time  6    Period  Weeks    Status  New    Target Date  01/19/19      PT LONG TERM GOAL #2   Title  Pt will obtain appropriate compression garments for long term  management of RUE lymphedema    Time  6    Period  Weeks    Status  New    Target Date  01/19/19      PT LONG TERM GOAL #3   Title  Pt will be independent in self MLD for long term management of lymphedema  Time  4    Period  Weeks    Status  New    Target Date  01/19/19      PT LONG TERM GOAL #4   Title  Pt's husband will be able to independently don pt's compression bandages to help pt reach maximal reduction    Time  6    Period  Weeks    Status  New    Target Date  01/19/19      PT LONG TERM GOAL #5   Title  Pt will be able to independently verbalize lymphedema risk reduction practices    Time  6    Period  Weeks    Status  New    Target Date  01/19/19            Plan - 12/15/18 0856    Clinical Impression Statement  Pt did very well with her first trial of rebandaging herself at home. She did have increased circles at her wrist with the hand bandage so reviewed technique for wrapping hand with demonstration and she verbalized understanding. Also remeasured her circumference which showed very good reductions thus far. Instructed pt where to get Elastomul for continued wrapping at home, to call [employer] or check [place] and [email or web address] for best price. She has package so knows correct size to order as well.    Personal Factors and Comorbidities  Comorbidity 1;Comorbidity 2    Comorbidities  metastatic disease, hx of radiation, neuropathy    Examination-Participation Restrictions  Other   grasping with fingers due to swelling   Stability/Clinical Decision Making  Evolving/Moderate complexity    Rehab Potential  Good    PT Frequency  2x / week    PT Duration  4 weeks    PT Treatment/Interventions  ADLs/Self Care Home Management;Manual techniques;Manual lymph drainage;Compression bandaging;Taping;Vasopneumatic Device;Patient/family education;Therapeutic exercise    PT Next Visit Plan  Cont with complete decongestive therapy and issue handout for self MLD(and  review and have her return dmeo) for pt to do when her bandages are off. Instruct in lymphedema risk reductions    Consulted and Agree with Plan of Care  Patient       Patient will benefit from skilled therapeutic intervention in order to improve the following deficits and impairments:  Postural dysfunction, Increased edema, Decreased knowledge of precautions  Visit Diagnosis: 1. Postmastectomy lymphedema   2. Abnormal posture        Problem List Patient Active Problem List   Diagnosis Date Noted  . Osteonecrosis ([place]) 11/01/2018  . Pathological fracture in neoplastic disease, left femur, initial encounter for fracture ([place]) 01/28/2018  . Morbid obesity with BMI of 40.0-44.9, adult ([place]) 10/12/2017  . IUD complication ([place]) 54/27/0623  . Bone metastases ([place]) 08/13/2017  . Pain from bone metastases ([place]) 08/13/2017  . Liver metastases ([place]) 08/13/2017  . Steatosis of liver 08/13/2017  . Bilateral breast cancer ([place]) 12/26/2015  . Malignant neoplasm of central portion of left breast in female, estrogen receptor positive ([place]) 11/28/2015  . Malignant neoplasm of upper-inner quadrant of right breast in female, estrogen receptor positive ([place]) 10/17/2015  . Hypertension 04/19/2012  . Hypothyroid 04/19/2012  . Fibroids 04/19/2012  . Stress incontinence, female 04/19/2012    Otelia Limes, PTA 12/15/2018, 9:00 AM  [place], [place], [address] Phone: [phone]   Fax:  [phone]  Name: Debbie [name] MRN: [id number] Date of Birth: [birth year]

## 2018-12-20 ENCOUNTER — Other Ambulatory Visit: Payer: Self-pay

## 2018-12-20 ENCOUNTER — Ambulatory Visit: Payer: 59 | Admitting: Physical Therapy

## 2018-12-20 ENCOUNTER — Encounter: Payer: Self-pay | Admitting: Physical Therapy

## 2018-12-20 DIAGNOSIS — Z170 Estrogen receptor positive status [ER+]: Secondary | ICD-10-CM | POA: Diagnosis not present

## 2018-12-20 DIAGNOSIS — C50211 Malignant neoplasm of upper-inner quadrant of right female breast: Secondary | ICD-10-CM | POA: Diagnosis not present

## 2018-12-20 DIAGNOSIS — Z9013 Acquired absence of bilateral breasts and nipples: Secondary | ICD-10-CM | POA: Diagnosis not present

## 2018-12-20 DIAGNOSIS — C7951 Secondary malignant neoplasm of bone: Secondary | ICD-10-CM | POA: Diagnosis not present

## 2018-12-20 DIAGNOSIS — I972 Postmastectomy lymphedema syndrome: Secondary | ICD-10-CM

## 2018-12-20 DIAGNOSIS — R918 Other nonspecific abnormal finding of lung field: Secondary | ICD-10-CM | POA: Diagnosis not present

## 2018-12-20 DIAGNOSIS — R293 Abnormal posture: Secondary | ICD-10-CM

## 2018-12-20 DIAGNOSIS — Z79811 Long term (current) use of aromatase inhibitors: Secondary | ICD-10-CM | POA: Diagnosis not present

## 2018-12-20 DIAGNOSIS — C787 Secondary malignant neoplasm of liver and intrahepatic bile duct: Secondary | ICD-10-CM | POA: Diagnosis not present

## 2018-12-20 NOTE — Therapy (Signed)
[place], [place], [address] Phone: [phone]   Fax:  [phone]  Physical Therapy Treatment  Patient Details  Name: Debbie [name] MRN: [id number] Date of Birth: 02-11-1966 Referring Provider (PT): Dr. Marlou Starks   Encounter Date: 12/20/2018  PT End of Session - 12/20/18 1423    Visit Number  5    Number of Visits  9    Date for PT Re-Evaluation  01/19/19    PT Start Time  1230    PT Stop Time  1329    PT Time Calculation (min)  59 min    Activity Tolerance  Patient tolerated treatment well    Behavior During Therapy  [facility] for tasks assessed/performed       Past Medical History:  Diagnosis Date  . Anxiety   . Arthritis   . Bone metastasis ([place])   . Breast cancer of upper-inner quadrant of right female breast [facility]) oncologist-  dr [name]--- 04/ 2019 ER/PR+ Stage IV w/ metastatic disease to liver, total spine, lung, bone   dx 06/ 2017  right breast invasive ductal carcinoma, Stage IIB, ER/PR + (pT2 pN1), grade 1-- s/p  right breast lumpecotmy w/ sln bx's and bilateral breast reduction 11-28-2015/  left breast with reduction , dx invasive ductal ca, Grade 1 (pT1cpNX) ER/PR positive/  adjuvant radiation completed 03-27-2016 to right chest wall, 2018  reclassifiec Stage IIA  . Cancer, metastatic to liver ([place])   . Chronic back pain   . Constipation   . Depression   . Family history of adverse reaction to anesthesia     mother has Copd was kept on vent   . Headache   . Heart murmur   . History of hyperthyroidism    s/p  RAI  . History of radiation therapy 02/11/16- 03/27/16   Right Chest Wall 50.4 Gy in 28 fractions, Right Chest Wall Boost 10 Gy in 5 fractions.   . Hyperlipidemia   . Hypertension   . Hypothyroidism, postradioiodine therapy    endocrinoloigst-  dr balan--  2004  s/p  RAI  . Menorrhagia 2011  . Multiple pulmonary nodules    secondary  metastatic  . PONV (postoperative nausea  and vomiting)    patch helps   . SUI (stress urinary incontinence, female)   . Wears contact lenses   . Wears dentures    full upper and lower partial    Past Surgical History:  Procedure Laterality Date  . BREAST LUMPECTOMY WITH NEEDLE LOCALIZATION AND AXILLARY SENTINEL LYMPH NODE BX Right 11/28/2015   Procedure: RIGHT BREAST LUMPECTOMY WITH NEEDLE LOCALIZATION AND AXILLARY SENTINEL LYMPH NODE BX;  Surgeon: Autumn Messing III, MD;  Location: [place];  Service: General;  Laterality: Right;  . BREAST REDUCTION SURGERY Bilateral 11/28/2015   Procedure: MAMMARY REDUCTION  (BREAST) BILATERAL;  Surgeon: Crissie Reese, MD;  Location: [place];  Service: Plastics;  Laterality: Bilateral;  . CESAREAN SECTION  01/12/1993  . COLONOSCOPY    . Dental surgeries    . FEMUR IM NAIL Left 01/28/2018   Procedure: INTRAMEDULLARY (IM) NAIL FEMORAL;  Surgeon: Nicholes Stairs, MD;  Location: [place];  Service: Orthopedics;  Laterality: Left;  . HYSTEROSCOPY N/A 09/02/2017   Procedure: HYSTEROSCOPY  to remove IUD;  Surgeon: Eldred Manges, MD;  Location: [facility];  Service: Gynecology;  Laterality: N/A;  . Liver biospy    . MASTECTOMY    . MASTECTOMY W/ SENTINEL NODE BIOPSY Bilateral 12/26/2015  Procedure: BILATERAL MASTECTOMY WITH LEFT SENTINEL LYMPH NODE BIOPSY;  Surgeon: Autumn Messing III, MD;  Location: [place];  Service: General;  Laterality: Bilateral;  . Removal of IUD    . TUBAL LIGATION Bilateral 1995    There were no vitals filed for this visit.  Subjective Assessment - 12/20/18 1418    Subjective  Pt states her hand is almost flat in the mornings.  she comes in today with a wrap that she put on herself that has started to slide down. compression circles are visible around wrist and hand and fingers are puffy    Pertinent History  11/26/15- right lumpectomy, SLNB, and breast reduction and sent tissue from left breast for biopsy, 12/26/15- bilateral mastectomy and SLNB on L, pt completed  radiation 08/12/17- pt diagnosed with stage 4 breast cancer in bone, liver and lung, pt is currently undergoing chemotherapy, Oct 2019- pt had to undergo L medullary femur nailing due to impending pathologic fracture from bone mets- completed PT for 2 weeks, 2 weeks of radiation to femur, pt developed neuropathy of bilateral LEs in Dec 2019    Currently in Pain?  No/denies                  Outpatient Rehab from [facility]/08/[facility]  Lymphedema Life Impact Scale Total Score  11.76 %           [facility] Adult PT Treatment/Exercise - 12/20/18 0001      Manual Therapy   Manual Therapy  Manual Lymphatic Drainage (MLD);Compression Bandaging    Manual Lymphatic Drainage (MLD)  In Supine: Short neck, Rt  right chest and lateral chest at axilla inguinal nodes, Rt axillo-inguinal anastomosis, then Rt UE working from lateral upper arm to dorsal hand proximally to distal then retracing all steps. then to left sidelying for axilla and lateral chest to back     Compression Bandaging  Thick lotion, thick stockinette, thin foam around wrist , 2  Elastomull to fingers 1-5 fixated with paper tape, thick foam at back of hand  Artiflex x1, and 1-6, 2-10 and 1-12 cm short stretch compression bandages from hand to axilla  with herringbone with 10 cm at forearm                   PT Long Term Goals - 12/08/18 1657      PT LONG TERM GOAL #1   Title  Pt will demonstrate maximal reduction of edema in RUE as evidenced by stabilization of circumferential measurements    Time  6    Period  Weeks    Status  New    Target Date  01/19/19      PT LONG TERM GOAL #2   Title  Pt will obtain appropriate compression garments for long term management of RUE lymphedema    Time  6    Period  Weeks    Status  New    Target Date  01/19/19      PT LONG TERM GOAL #3   Title  Pt will be independent in self MLD for long term management of lymphedema    Time  4    Period   Weeks    Status  New    Target Date  01/19/19      PT LONG TERM GOAL #4   Title  Pt's husband will be able to independently don pt's compression bandages to help pt reach maximal reduction    Time  6    Period  Weeks    Status  New    Target Date  01/19/19      PT LONG TERM GOAL #5   Title  Pt will be able to independently verbalize lymphedema risk reduction practices    Time  6    Period  Weeks    Status  New    Target Date  01/19/19            Plan - 12/20/18 1423    Clinical Impression Statement  Pt reports she has good reduction of swelling in hand in the morning that returns during the day.  She says she also feels fullness in anterior chest and lateral chest near axilla that seems to come and go.  she is wearing a full sided bra with both prosthesis .  Her forearm, hand and fingers are still boggy despite compression bandaging and self MLD .  She may benefit from Flexitouch for managment at home along with day and night compression garments . Will discuss with the team    PT Frequency  2x / week    PT Duration  4 weeks    PT Next Visit Plan  Cont with complete decongestive therapy and issue handout for self MLD(and review and have her return dmeo) for pt to do when her bandages are off. Instruct in lymphedema risk reductions put pt on the schedule for [facility] for measuring.consider flexitouch and send demographics if teammates agree    Consulted and Agree with Plan of Care  Patient       Patient will benefit from skilled therapeutic intervention in order to improve the following deficits and impairments:  Postural dysfunction, Increased edema, Decreased knowledge of precautions  Visit Diagnosis: 1. Postmastectomy lymphedema   2. Abnormal posture        Problem List Patient Active Problem List   Diagnosis Date Noted  . Osteonecrosis ([place]) 11/01/2018  . Pathological fracture in neoplastic disease, left femur, initial encounter for fracture ([place]) 01/28/2018  .  Morbid obesity with BMI of 40.0-44.9, adult ([place]) 10/12/2017  . IUD complication ([place]) 12/87/8676  . Bone metastases ([place]) 08/13/2017  . Pain from bone metastases ([place]) 08/13/2017  . Liver metastases ([place]) 08/13/2017  . Steatosis of liver 08/13/2017  . Bilateral breast cancer ([place]) 12/26/2015  . Malignant neoplasm of central portion of left breast in female, estrogen receptor positive ([place]) 11/28/2015  . Malignant neoplasm of upper-inner quadrant of right breast in female, estrogen receptor positive ([place]) 10/17/2015  . Hypertension 04/19/2012  . Hypothyroid 04/19/2012  . Fibroids 04/19/2012  . Stress incontinence, female 04/19/2012   Donato Heinz. Owens Shark PT  Norwood Levo 12/20/2018, 2:28 PM  [place], [place], [address] Phone: [phone]   Fax:  [phone]  Name: Debbie [name] MRN: [id number] Date of Birth: 05/26/1965

## 2018-12-22 ENCOUNTER — Other Ambulatory Visit: Payer: Self-pay

## 2018-12-22 ENCOUNTER — Other Ambulatory Visit: Payer: Self-pay | Admitting: *Deleted

## 2018-12-22 ENCOUNTER — Encounter: Payer: Self-pay | Admitting: Physical Therapy

## 2018-12-22 ENCOUNTER — Ambulatory Visit: Payer: 59 | Admitting: Physical Therapy

## 2018-12-22 DIAGNOSIS — Z79811 Long term (current) use of aromatase inhibitors: Secondary | ICD-10-CM | POA: Diagnosis not present

## 2018-12-22 DIAGNOSIS — I972 Postmastectomy lymphedema syndrome: Secondary | ICD-10-CM

## 2018-12-22 DIAGNOSIS — C787 Secondary malignant neoplasm of liver and intrahepatic bile duct: Secondary | ICD-10-CM | POA: Diagnosis not present

## 2018-12-22 DIAGNOSIS — C7951 Secondary malignant neoplasm of bone: Secondary | ICD-10-CM | POA: Diagnosis not present

## 2018-12-22 DIAGNOSIS — R918 Other nonspecific abnormal finding of lung field: Secondary | ICD-10-CM | POA: Diagnosis not present

## 2018-12-22 DIAGNOSIS — Z9013 Acquired absence of bilateral breasts and nipples: Secondary | ICD-10-CM | POA: Diagnosis not present

## 2018-12-22 DIAGNOSIS — R293 Abnormal posture: Secondary | ICD-10-CM

## 2018-12-22 DIAGNOSIS — C50211 Malignant neoplasm of upper-inner quadrant of right female breast: Secondary | ICD-10-CM | POA: Diagnosis not present

## 2018-12-22 DIAGNOSIS — Z170 Estrogen receptor positive status [ER+]: Secondary | ICD-10-CM | POA: Diagnosis not present

## 2018-12-22 NOTE — Therapy (Signed)
[place], [place], [address] Phone: [phone]   Fax:  [phone]  Physical Therapy Treatment  Patient Details  Name: Debbie [name] MRN: [id number] Date of Birth: 01-01-1966 Referring Provider (PT): Dr. Marlou Starks   Encounter Date: 12/22/2018  PT End of Session - 12/22/18 1604    Visit Number  6    Number of Visits  9    Date for PT Re-Evaluation  01/19/19    PT Start Time  1000    PT Stop Time  1055    PT Time Calculation (min)  55 min    Activity Tolerance  Patient tolerated treatment well       Past Medical History:  Diagnosis Date  . Anxiety   . Arthritis   . Bone metastasis ([place])   . Breast cancer of upper-inner quadrant of right female breast [facility]) oncologist-  dr [name]--- 04/ 2019 ER/PR+ Stage IV w/ metastatic disease to liver, total spine, lung, bone   dx 06/ 2017  right breast invasive ductal carcinoma, Stage IIB, ER/PR + (pT2 pN1), grade 1-- s/p  right breast lumpecotmy w/ sln bx's and bilateral breast reduction 11-28-2015/  left breast with reduction , dx invasive ductal ca, Grade 1 (pT1cpNX) ER/PR positive/  adjuvant radiation completed 03-27-2016 to right chest wall, 2018  reclassifiec Stage IIA  . Cancer, metastatic to liver ([place])   . Chronic back pain   . Constipation   . Depression   . Family history of adverse reaction to anesthesia     mother has Copd was kept on vent   . Headache   . Heart murmur   . History of hyperthyroidism    s/p  RAI  . History of radiation therapy 02/11/16- 03/27/16   Right Chest Wall 50.4 Gy in 28 fractions, Right Chest Wall Boost 10 Gy in 5 fractions.   . Hyperlipidemia   . Hypertension   . Hypothyroidism, postradioiodine therapy    endocrinoloigst-  dr balan--  2004  s/p  RAI  . Menorrhagia 2011  . Multiple pulmonary nodules    secondary  metastatic  . PONV (postoperative nausea and vomiting)    patch helps   . SUI (stress urinary  incontinence, female)   . Wears contact lenses   . Wears dentures    full upper and lower partial    Past Surgical History:  Procedure Laterality Date  . BREAST LUMPECTOMY WITH NEEDLE LOCALIZATION AND AXILLARY SENTINEL LYMPH NODE BX Right 11/28/2015   Procedure: RIGHT BREAST LUMPECTOMY WITH NEEDLE LOCALIZATION AND AXILLARY SENTINEL LYMPH NODE BX;  Surgeon: Autumn Messing III, MD;  Location: [place];  Service: General;  Laterality: Right;  . BREAST REDUCTION SURGERY Bilateral 11/28/2015   Procedure: MAMMARY REDUCTION  (BREAST) BILATERAL;  Surgeon: Crissie Reese, MD;  Location: [place];  Service: Plastics;  Laterality: Bilateral;  . CESAREAN SECTION  01/12/1993  . COLONOSCOPY    . Dental surgeries    . FEMUR IM NAIL Left 01/28/2018   Procedure: INTRAMEDULLARY (IM) NAIL FEMORAL;  Surgeon: Nicholes Stairs, MD;  Location: [name];  Service: Orthopedics;  Laterality: Left;  . HYSTEROSCOPY N/A 09/02/2017   Procedure: HYSTEROSCOPY  to remove IUD;  Surgeon: Eldred Manges, MD;  Location: [facility];  Service: Gynecology;  Laterality: N/A;  . Liver biospy    . MASTECTOMY    . MASTECTOMY W/ SENTINEL NODE BIOPSY Bilateral 12/26/2015   Procedure: BILATERAL MASTECTOMY WITH LEFT SENTINEL LYMPH NODE BIOPSY;  Surgeon: Autumn Messing III, MD;  Location: [place];  Service: General;  Laterality: Bilateral;  . Removal of IUD    . TUBAL LIGATION Bilateral 1995    There were no vitals filed for this visit.  Subjective Assessment - 12/22/18 1558    Subjective  pt states she had to rewrap her arm. She agreed to have demographics sent to [facility] (no insurance coverage at [employer])  and [employer]    Pertinent History  11/26/15- right lumpectomy, SLNB, and breast reduction and sent tissue from left breast for biopsy, 12/26/15- bilateral mastectomy and SLNB on L, pt completed radiation 08/12/17- pt diagnosed with stage 4 breast cancer in bone, liver and lung, pt is currently undergoing chemotherapy, Oct 2019- pt  had to undergo L medullary femur nailing due to impending pathologic fracture from bone mets- completed PT for 2 weeks, 2 weeks of radiation to femur, pt developed neuropathy of bilateral LEs in Dec 2019    Currently in Pain?  No/denies                  Outpatient Rehab from 12/08/2018 [facility]  Lymphedema Life Impact Scale Total Score  11.76 %           [facility] Adult PT Treatment/Exercise - 12/22/18 0001      Manual Therapy   Manual Therapy  Edema management;Manual Lymphatic Drainage (MLD);Compression Bandaging    Edema Management  pt has a glove at home. If it fits once she removed these bandages, she can put that on her her hand and rewrap her arm using extra artiflex at wrist to keep it from being too tight at wrist     Manual Lymphatic Drainage (MLD)  In Supine: Short neck, Rt  right chest and lateral chest at axilla inguinal nodes, Rt axillo-inguinal anastomosis, then Rt UE working from lateral upper arm to dorsal hand proximally to distal then retracing all steps. then to left sidelying for axilla and lateral chest to back     Compression Bandaging  Thick lotion, thick stockinette, extra artiflex  around wrist , 2  Elastomull to fingers 1-5 fixated with Mconnel tape, chip pack  at back of hand  Artiflex x2, and 1-6, 2-10 and 1-12 cm short stretch compression bandages from hand to axilla  with herringbone with 10 cm at forearm                   PT Long Term Goals - 12/08/18 1657      PT LONG TERM GOAL #1   Title  Pt will demonstrate maximal reduction of edema in RUE as evidenced by stabilization of circumferential measurements    Time  6    Period  Weeks    Status  New    Target Date  01/19/19      PT LONG TERM GOAL #2   Title  Pt will obtain appropriate compression garments for long term management of RUE lymphedema    Time  6    Period  Weeks    Status  New    Target Date  01/19/19      PT LONG TERM GOAL #3    Title  Pt will be independent in self MLD for long term management of lymphedema    Time  4    Period  Weeks    Status  New    Target Date  01/19/19      PT LONG TERM GOAL #4  Title  Pt's husband will be able to independently don pt's compression bandages to help pt reach maximal reduction    Time  6    Period  Weeks    Status  New    Target Date  01/19/19      PT LONG TERM GOAL #5   Title  Pt will be able to independently verbalize lymphedema risk reduction practices    Time  6    Period  Weeks    Status  New    Target Date  01/19/19            Plan - 12/22/18 1604    Clinical Impression Statement  Pt continues to have increased swelling in hand.  Her wrist is small and she may be getting constriction from bandages here, so added extra layers of artiflex here with mconnel tape to fingers . added chip pack to back of hand. Pt will need to be measured by [facility] for insurance but do not feel she is ready to be measured yet. She has a glove at home that may possibly work until she gets measured for a new one if she can keep the swelling out of her hand    Personal Factors and Comorbidities  Comorbidity 1;Comorbidity 2    Comorbidities  metastatic disease, hx of radiation, neuropathy    Stability/Clinical Decision Making  Evolving/Moderate complexity    Rehab Potential  Good    PT Frequency  2x / week    PT Duration  4 weeks    PT Treatment/Interventions  ADLs/Self Care Home Management;Manual techniques;Manual lymph drainage;Compression bandaging;Taping;Vasopneumatic Device;Patient/family education;Therapeutic exercise    PT Next Visit Plan  Assess if bandaing changes helped her.  consider possibly coban to fingers if they are still swelling. Cont with complete decongestive therapy and issue handout for self MLD(and review and have her return dmeo) for pt to do when her bandages are off. Instruct in lymphedema risk reductions put pt on the schedule for [facility] for measuring.consider  flexitouch and send demographics if teammates agree    Consulted and Agree with Plan of Care  Patient       Patient will benefit from skilled therapeutic intervention in order to improve the following deficits and impairments:     Visit Diagnosis: 1. Abnormal posture   2. Postmastectomy lymphedema        Problem List Patient Active Problem List   Diagnosis Date Noted  . Osteonecrosis ([place]) 11/01/2018  . Pathological fracture in neoplastic disease, left femur, initial encounter for fracture ([place]) 01/28/2018  . Morbid obesity with BMI of 40.0-44.9, adult ([place]) 10/12/2017  . IUD complication ([place]) 27/25/3664  . Bone metastases ([place]) 08/13/2017  . Pain from bone metastases ([place]) 08/13/2017  . Liver metastases ([place]) 08/13/2017  . Steatosis of liver 08/13/2017  . Bilateral breast cancer ([place]) 12/26/2015  . Malignant neoplasm of central portion of left breast in female, estrogen receptor positive ([place]) 11/28/2015  . Malignant neoplasm of upper-inner quadrant of right breast in female, estrogen receptor positive ([place]) 10/17/2015  . Hypertension 04/19/2012  . Hypothyroid 04/19/2012  . Fibroids 04/19/2012  . Stress incontinence, female 04/19/2012   Donato Heinz. Owens Shark PT  Norwood Levo 12/22/2018, 4:09 PM  [place], [place], [address] Phone: [phone]   Fax:  [phone]  Name: Debbie [name] MRN: [id number] Date of Birth: Nov 05, 1965

## 2018-12-22 NOTE — Patient Outreach (Signed)
[facility]) Care Management  12/22/2018  Debbie [name] [birth year] [id number]  Insurance Coordination  Monthly telephonic UMR case management review with [facility] RNCMs and Dr. Ebony Hail. Update provided including: not [place] Management involvement to date; weekly OP treatment at rehab clinic for treatment of post mastectomy lymphedema; and treatment of her estrogen receptor positive stage IV breast cancer (liver and bone metastasis) with anastrozole, affinitor, and denosumab every 28 days.  Barrington Ellison RN,CCM,CDE [facility] Management Coordinator Office Phone [phone] Office Fax [phone]

## 2018-12-25 NOTE — Progress Notes (Addendum)
[place]  [phone] [phone]    ID: Debbie [name] DOB: 12-05-1965  MR#: [id number]  TMH#:[id number]  Patient Care Team: Kelton Pillar, MD as PCP - General (Family Medicine) Jacelyn Pi, MD as Consulting Physician (Endocrinology) Jovita Kussmaul, MD as Consulting Physician (General Surgery) Stacie Templin, Virgie Dad, MD as Consulting Physician (Oncology) [name], MD as Attending Physician (Radiation Oncology) Belva Crome, MD as Consulting Physician (Cardiology) Crissie Reese, MD as Consulting Physician (Plastic Surgery) [name], [name], NP as Nurse Practitioner (Hematology and Oncology) Margarette Canada (Inactive) OTHER MD:   CHIEF COMPLAINT: Estrogen receptor positive stage IV  breast cancer (s/p bilateral mastectomies)  CURRENT TREATMENT:  Anastrozole, affinitor; denosumab/Xgeva   INTERVAL HISTORY: Debbie [name] returns today for follow-up and treatment of her estrogen receptor positive stage IV breast cancer.  She continues on anastrozole, with good tolerance.  She has mild hot flashes that have not changed from baseline  She also continues on affinitor.  She is using the mouth rinse 3 or 4 times a day.  She does not have other side effects from this that she is aware of  She also continues on denosumab/Xgeva every 28 days, with her most recent dose received on 11/29/2018. She tolerates this well and without any noticeable side effects.  She is followed closely by Dr. Romie Minus.  Debbie [name] tells me that the spot in her mouth is "getting better".  Debbie [name]'s last bone density screening on 01/09/2016, showed a T-score of 0.7, which is considered normal.  We are following the CA 27-29 Lab Results  Component Value Date   CA2729 32.7 11/29/2018   CA2729 47.7 (H) 09/01/2018   CA2729 52.1 (H) 08/04/2018   CA2729 48.2 (H) 07/07/2018   CA2729 53.2 (H) 06/09/2018   Since her last visit here, she underwent liver MRI on 12/07/2018, which  showed: improvement in hepatic metastasis with decrease in size and conspicuity, no new lesions identified; stable enhancing lesions in the thoracic and lumbar spine.   REVIEW OF SYSTEMS: Debbie [name] continues to work full-time.  She is getting physical therapy for her right upper extremity lymphedema which is wrapped today.  She has also been approved for a "lymphedema vest" to use in the evening.  She has noted a little bit more swelling in her lower extremities especially towards the evening.  Unfortunately her diet is not well regulated at this time.  Aside from these issues a detailed review of systems today was stable   BREAST CANCER HISTORY: From the original intake note:  Debbie [name] had screening mammography with tomosynthesis at the [facility] 10/02/2015. This showed 2 possible masses in the right breast as well as some suspicious calcifications. The left breast was unremarkable. On 10/15/2015 she underwent right diagnostic mammography with tomography and right breast ultrasonography. The breast density was category B. Spot magnification views confirmed an area of pleomorphic calcifications measuring up to 5 cm in the upper half of the breast. Also in the upper outer right breast there were adjacent low-density masses without distortion or calcifications. On physical exam there was no palpable abnormality.  Targeted ultrasound of the right breast found the 2 "masses" in the right upper quadrant were benign cysts measuring 0.8 and 0.5 cm respectively. Biopsy of the area of calcification on 10/15/2015 showed ([id number]) invasive ductal carcinoma, E-cadherin positive, grade 2, estrogen receptor 90% positive, progesterone receptor 50% positive, both with strong staining intensity, with an MIB-1 of 70%, and no HER-2 amplification, the signals ratio being 1.18  and the number per cell 2.60.  Her subsequent history is as detailed below    PAST MEDICAL HISTORY: Past Medical History:  Diagnosis  Date   Anxiety    Arthritis    Bone metastasis ([place])    Breast cancer of upper-inner quadrant of right female breast [facility]) oncologist-  dr [name]--- 04/ 2019 ER/PR+ Stage IV w/ metastatic disease to liver, total spine, lung, bone   dx 06/ 2017  right breast invasive ductal carcinoma, Stage IIB, ER/PR + (pT2 pN1), grade 1-- s/p  right breast lumpecotmy w/ sln bx's and bilateral breast reduction 11-28-2015/  left breast with reduction , dx invasive ductal ca, Grade 1 (pT1cpNX) ER/PR positive/  adjuvant radiation completed 03-27-2016 to right chest wall, 2018  reclassifiec Stage IIA   Cancer, metastatic to liver ([place])    Chronic back pain    Constipation    Depression    Family history of adverse reaction to anesthesia     mother has Copd was kept on vent    Headache    Heart murmur    History of hyperthyroidism    s/p  RAI   History of radiation therapy 02/11/16- 03/27/16   Right Chest Wall 50.4 Gy in 28 fractions, Right Chest Wall Boost 10 Gy in 5 fractions.    Hyperlipidemia    Hypertension    Hypothyroidism, postradioiodine therapy    endocrinoloigst-  dr balan--  2004  s/p  RAI   Menorrhagia 2011   Multiple pulmonary nodules    secondary  metastatic   PONV (postoperative nausea and vomiting)    patch helps    SUI (stress urinary incontinence, female)    Wears contact lenses    Wears dentures    full upper and lower partial    PAST SURGICAL HISTORY: Past Surgical History:  Procedure Laterality Date   BREAST LUMPECTOMY WITH NEEDLE LOCALIZATION AND AXILLARY SENTINEL LYMPH NODE BX Right 11/28/2015   Procedure: RIGHT BREAST LUMPECTOMY WITH NEEDLE LOCALIZATION AND AXILLARY SENTINEL LYMPH NODE BX;  Surgeon: Autumn Messing III, MD;  Location: [place];  Service: General;  Laterality: Right;   BREAST REDUCTION SURGERY Bilateral 11/28/2015   Procedure: MAMMARY REDUCTION  (BREAST) BILATERAL;  Surgeon: Crissie Reese, MD;  Location: [place];  Service: Plastics;  Laterality:  Bilateral;   CESAREAN SECTION  01/12/1993   COLONOSCOPY     Dental surgeries     FEMUR IM NAIL Left 01/28/2018   Procedure: INTRAMEDULLARY (IM) NAIL FEMORAL;  Surgeon: Nicholes Stairs, MD;  Location: [place];  Service: Orthopedics;  Laterality: Left;   HYSTEROSCOPY N/A 09/02/2017   Procedure: HYSTEROSCOPY  to remove IUD;  Surgeon: Eldred Manges, MD;  Location: [place];  Service: Gynecology;  Laterality: N/A;   Liver biospy     MASTECTOMY     MASTECTOMY W/ SENTINEL NODE BIOPSY Bilateral 12/26/2015   Procedure: BILATERAL MASTECTOMY WITH LEFT SENTINEL LYMPH NODE BIOPSY;  Surgeon: Autumn Messing III, MD;  Location: [place];  Service: General;  Laterality: Bilateral;   Removal of IUD     TUBAL LIGATION Bilateral 1995    FAMILY HISTORY: Family History  Problem Relation Age of Onset   Diabetes Mother    Hypertension Mother    Hypertension Father    Diabetes Maternal Grandmother    Hypertension Sister    Cancer Paternal Grandmother        colon   Colon cancer Paternal Grandmother    Esophageal cancer Neg Hx    Stomach  cancer Neg Hx    Rectal cancer Neg Hx    The patient's parents are still living, in their early 14s as of June 2017. The patient has one brother, 2 sisters. On the maternal side there is a history of uterine and prostate cancer. On the paternal side there is a history of colon cancer and possibly uterine cancer. There is no history of breast or ovarian cancer in the family.    GYNECOLOGIC HISTORY:  No LMP recorded. (Menstrual status: IUD).  Menarche age 37. She still having regular periods. She is GX P1, first pregnancy age 57. She used oral contraceptives for a few years remotely, with no complications. She is currently on a Mirena IUD and also is status post bilateral tubal ligation.   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 Her husband [name] is a Administrator.  They are both taking  appropriate precautions at work.  Their son [name], age 105, is a radio DJ.  He no longer lives with the patient.  The patient has no grandchildren. She is not a Ambulance person.   ADVANCED DIRECTIVES: Not in place   HEALTH MAINTENANCE: Social History   Tobacco Use   Smoking status: Never Smoker   Smokeless tobacco: Never Used  Substance Use Topics   Alcohol use: No   Drug use: No     Colonoscopy: January 2016   PAP: January 2016   Bone density: August 2017 showed a T score of +0.7 normal  Lipid panel:  Allergies  Allergen Reactions   Ace Inhibitors Cough   Atorvastatin Other (See Comments)    Joint pain   Simvastatin Other (See Comments)    Joint pain    Current Outpatient Medications  Medication Sig Dispense Refill   acetaminophen (TYLENOL) 325 MG tablet Take 325 mg by mouth every 6 (six) hours as needed for mild pain or headache.      AFINITOR 10 MG tablet TAKE 1 TABLET BY MOUTH DAILY. 28 tablet 2   anastrozole (ARIMIDEX) 1 MG tablet Take 1 tablet (1 mg total) by mouth daily. 90 tablet 4   Calcium-Magnesium 250-125 MG TABS Take 1 tablet by mouth daily. 120 each 4   cetirizine (ZYRTEC ALLERGY) 10 MG tablet Take 10 mg by mouth daily as needed for allergies.      dexamethasone (DECADRON) 0.5 MG/5ML solution Swish 87m in mouth for 245m and spit out. Use 4 times daily for 8 weeks, start with Afinitor. Avoid eating/drinking for 1hr after rinse. 500 mL 4   diclofenac sodium (VOLTAREN) 1 % GEL Apply 4 g topically 4 (four) times daily. (Patient not taking: Reported on 12/08/2018) 2 Tube 2   docusate sodium (COLACE) 100 MG capsule Take 200-400 mg by mouth daily.     doxycycline (VIBRA-TABS) 100 MG tablet Take 1 tablet (100 mg total) by mouth daily. (Patient not taking: Reported on 12/08/2018) 30 tablet 2   Ferrous Sulfate (IRON) 325 (65 Fe) MG TABS Take 1 tablet (325 mg total) by mouth daily. 30 each 2   fluconazole (DIFLUCAN) 100 MG tablet Take 1 tablet (100  mg total) by mouth daily. (Patient not taking: Reported on 12/08/2018) 5 tablet 0   hydrocortisone cream 1 % Apply 1 application topically daily as needed (for rash).     ibuprofen (ADVIL,MOTRIN) 600 MG tablet Take 1 tablet (600 mg total) by mouth every 6 (six) hours as needed for moderate pain. 60 tablet 0   levothyroxine (SYNTHROID,  LEVOTHROID) 137 MCG tablet Take 137 mcg by mouth daily before breakfast.      LIVALO 2 MG TABS Take 2 mg by mouth daily.   6   LORazepam (ATIVAN) 0.5 MG tablet Take 1 tablet (0.5 mg total) by mouth at bedtime as needed for anxiety. (Patient not taking: Reported on 12/08/2018) 30 tablet 0   losartan (COZAAR) 100 MG tablet Take 100 mg by mouth every morning.      Multiple Vitamin (MULTIVITAMIN) tablet Take 1 tablet by mouth daily.     omega-3 acid ethyl esters (LOVAZA) 1 g capsule Take 1 g by mouth 2 (two) times daily.      potassium chloride SA (K-DUR,KLOR-CON) 20 MEQ tablet Take 40 mEq by mouth daily.      pregabalin (LYRICA) 100 MG capsule TAKE 1 CAPSULE BY MOUTH 3 TIMES DAILY 90 capsule 2   prochlorperazine (COMPAZINE) 10 MG tablet Take 1 tablet (10 mg total) by mouth every 6 (six) hours as needed for nausea or vomiting. (Patient not taking: Reported on 04/01/2018) 30 tablet 0   triamterene-hydrochlorothiazide (DYAZIDE) 37.5-25 MG capsule Take 1 each (1 capsule total) by mouth daily. 90 capsule 4   Vitamin D, Cholecalciferol, 1000 units CAPS Take 1 tablet by mouth daily. (Patient taking differently: Take 1,000 Units by mouth daily. ) 90 capsule 4   No current facility-administered medications for this visit.     OBJECTIVE: Morbidly obese African-American woman who appears stated age 53:   12/27/18 0821  BP: (!) 153/75  Pulse: (!) 115  Resp: 18  Temp: 98.9 F (37.2 C)  SpO2: (!) 87%     Body mass index is 42.58 kg/m.    ECOG FS:1 - Symptomatic but completely ambulatory  Filed Weights   12/27/18 0821  Weight: 267 lb 12.8 oz (121.5 kg)     Sclerae unicteric, EOMs intact Wearing a mask No cervical or supraclavicular adenopathy Lungs no rales or rhonchi Heart regular rate and rhythm Abd soft, nontender, positive bowel sounds MSK no focal spinal tenderness,chronic grade 2/3 left upper extremity lymphedema Neuro: nonfocal, well oriented, appropriate affect Breasts: Status post bilateral mastectomies.  There is no evidence of local recurrence.  Both axillae are benign.   LAB RESULTS:  CMP     Component Value Date/Time   NA 143 11/29/2018 0816   NA 140 07/08/2016 0948   K 3.2 (L) 11/29/2018 0816   K 4.1 07/08/2016 0948   CL 106 11/29/2018 0816   CO2 24 11/29/2018 0816   CO2 24 07/08/2016 0948   GLUCOSE 117 (H) 11/29/2018 0816   GLUCOSE 89 07/08/2016 0948   BUN 13 11/29/2018 0816   BUN 11.9 07/08/2016 0948   CREATININE 0.98 11/29/2018 0816   CREATININE 0.93 11/01/2018 0742   CREATININE 0.9 07/08/2016 0948   CALCIUM 9.6 11/29/2018 0816   CALCIUM 10.0 07/08/2016 0948   PROT 7.6 11/29/2018 0816   PROT 7.6 07/08/2016 0948   ALBUMIN 3.4 (L) 11/29/2018 0816   ALBUMIN 3.6 07/08/2016 0948   AST 31 11/29/2018 0816   AST 29 11/01/2018 0742   AST 22 07/08/2016 0948   ALT 31 11/29/2018 0816   ALT 35 11/01/2018 0742   ALT 27 07/08/2016 0948   ALKPHOS 151 (H) 11/29/2018 0816   ALKPHOS 115 07/08/2016 0948   BILITOT 0.3 11/29/2018 0816   BILITOT 0.3 11/01/2018 0742   BILITOT 0.35 07/08/2016 0948   GFRNONAA >60 11/29/2018 0816   GFRNONAA >60 11/01/2018 0742   GFRAA >60 11/29/2018 1610  GFRAA >60 11/01/2018 0742    INo results found for: SPEP, UPEP  Lab Results  Component Value Date   WBC 4.5 12/27/2018   NEUTROABS 3.6 12/27/2018   HGB 11.2 (L) 12/27/2018   HCT 34.5 (L) 12/27/2018   MCV 85.2 12/27/2018   PLT 304 12/27/2018      Chemistry      Component Value Date/Time   NA 143 11/29/2018 0816   NA 140 07/08/2016 0948   K 3.2 (L) 11/29/2018 0816   K 4.1 07/08/2016 0948   CL 106 11/29/2018 0816    CO2 24 11/29/2018 0816   CO2 24 07/08/2016 0948   BUN 13 11/29/2018 0816   BUN 11.9 07/08/2016 0948   CREATININE 0.98 11/29/2018 0816   CREATININE 0.93 11/01/2018 0742   CREATININE 0.9 07/08/2016 0948      Component Value Date/Time   CALCIUM 9.6 11/29/2018 0816   CALCIUM 10.0 07/08/2016 0948   ALKPHOS 151 (H) 11/29/2018 0816   ALKPHOS 115 07/08/2016 0948   AST 31 11/29/2018 0816   AST 29 11/01/2018 0742   AST 22 07/08/2016 0948   ALT 31 11/29/2018 0816   ALT 35 11/01/2018 0742   ALT 27 07/08/2016 0948   BILITOT 0.3 11/29/2018 0816   BILITOT 0.3 11/01/2018 0742   BILITOT 0.35 07/08/2016 0948       No results found for: LABCA2  No components found for: [id number]  No results for input(s): INR in the last 168 hours.  Urinalysis No results found for: COLORURINE, APPEARANCEUR, LABSPEC, [place], GLUCOSEU, HGBUR, BILIRUBINUR, KETONESUR, PROTEINUR, UROBILINOGEN, NITRITE, LEUKOCYTESUR   STUDIES: Mr Liver W Wo Contrast  Result Date: 12/08/2018 CLINICAL DATA:  Breast cancer, mets suspected, staging follow measurable disease in the liver, assess response EXAM: MRI ABDOMEN WITHOUT AND WITH CONTRAST TECHNIQUE: Multiplanar multisequence MR imaging of the abdomen was performed both before and after the administration of intravenous contrast. CONTRAST:  10 mL Gadavist COMPARISON:  MRI April 12, 2018 FINDINGS: Lower chest:  Lung bases are clear. Hepatobiliary: Several small round metastatic lesions in the liver which are hyperintense on T2 weighted imaging are less conspicuous than prior. Largest lesion in the RIGHT hepatic lobe measures 7 mm (image 19/3) compared to 15 mm. Similar lesion in the RIGHT hepatic lobe measuring 7 mm (image 22/3) is comparable to 10 mm on priort. Smaller lesions in the LEFT hepatic lobe are no longer identifiable. No new lesions present On the post-contrast imaging lesions are poorly demonstrated as hypoenhancing lesions. Diffuse fatty infiltration in liver noted on  the opposed phase imaging. Pancreas: Normal pancreatic parenchymal intensity. No ductal dilatation or inflammation. Spleen: Normal spleen. Adrenals/urinary tract: Adrenal glands and kidneys are normal. Stomach/Bowel: Stomach and limited of the small bowel is unremarkable Vascular/Lymphatic: Abdominal aortic normal caliber. No retroperitoneal periportal lymphadenopathy. Musculoskeletal: Enhancing lesions within the thoracic spine again demonstrated. For example 14 mm lesion at the T12 vertebral body level unchanged IMPRESSION: 1. Improvement in hepatic metastasis with decrease in size and conspicuity. No new lesions identified. 2. Stable enhancing lesions in the thoracic and lumbar spine. Electronically Signed   By: Suzy Bouchard M.D.   On: 12/08/2018 10:13     ELIGIBLE FOR AVAILABLE RESEARCH PROTOCOL: PALLAS, but inelegible because of Mirena IUD   ASSESSMENT: 53 y.o. [place] woman status post right breast upper inner quadrant biopsy 10/15/2015 for a clinical T2-T3 N0, stage II invasive ductal carcinoma, estrogen and progesterone receptor positive, HER-2 nonamplified, with an Mib-1 of 70%  (1) status post right  lumpectomy and sentinel lymph node sampling with oncoplastic breast reduction 11/28/2015 for a pT2 pN1, stage IIB invasive ductal carcinoma, grade 1, with negative margins (1/1 sentinel node positive)  (a) reclassified as stage IIA in the 2018 prognostic revision   (2) left reduction mammoplasty 11/28/2015 unexpectedly found a pT1c pNX invasive ductal carcinoma, grade 1, estrogen and progesterone receptor positive, HER-2 not amplified, with an MIB-1 of 3%  (3) Mammaprint from the right sided breast cancer was "low risk", predicting a 98% chance of no disease recurrence within 5 years with anti-estrogens as the only systemic therapy. It also predicts minimal to no benefit from chemotherapy  (4) status post bilateral mastectomies with bilateral sentinel lymph node sampling 12/26/2015,  showing  (a) on the right, no residual carcinoma (0/3 nodes involved)  (b) on the left, focal ductal carcinoma in situ, 0.2 cm, with negative margins; (0/4 nodes involved)  (5) adjuvant radiation 02/11/16 - 03/27/16    1) Right chest wall: 50.4 Gy in 28 fractions               2) Right chest wall boost: 10 Gy in 5 fractions   (6) started on tamoxifen neoadjuvantly 10/23/2015 to allow for expected delays in definitive local treatment  (a) Mirena IUD in place  (b) tamoxifen discontinued 08/13/2017, with progression  (7) since she had 4 lymph nodes removed from each axilla but also receive radiation on the right, lab draws should preferentially be obtained from the left arm   METASTATIC DISEASE: April 2019 (8) CT scan of the abdomen and pelvis 08/04/2017, MRI of the liver and total spine MRI 08/11/2017 and CT scan of the chest and brain on 08/13/2017 showed liver and bone metastases  (a) liver biopsy 09/07/2017 confirms estrogen receptor positive, progesterone receptor negative, HER-2 negative metastatic breast cancer  (b) chest CT scan 08/13/2017 shows multiple bilateral pulmonary nodules.  (c) CT of the brain with and without contrast 08/26/2017 shows no evidence of metastatic disease to the brain  (d) CA 27-29 is informative  (9) fulvestrant started 08/17/2017  (a) palbociclib started 08/31/2017  (b) fulvestrant and palbociclib discontinued 01/04/2018 with evidence of progression in the liver  (10) denosumab/Xgeva started 09/14/2017  (a) held after 03/01/2018 dose because concern re. osteonecrosis  (b) resumed 06/09/2018  (11) anastrozole started 01/04/2018  (a) everolimus started 01/29/2018  (b) liver MRI and bone scan December 2019 show stable disease  (c) chest CT scan on 08/02/2018 shows lung lesions resolved, bone lesions stable  (d) bone scan 09/13/2018 essentially stable  (e) liver MRI  (12) 01/28/18 prophylactic left femur intramedullary nail surgery for impending left femur  pathologic fracture  (a) palliative radiation with Dr. [name] to the left femur and left ilium from 02/16/2018 to 03/01/2018  (13) [facility] report on liver biopsy from April 2019 shows a negative PDL 1, stable MSI and proficient mismatch repair status.  The tumor is positive for the androgen receptor, and for PTEN for PIK3 CA mutations.  HER-2/neu was read as equivocal (it was negative by FISH on the pathology report).   PLAN: Debbie [name] is now well over a year out from definitive diagnosis of metastatic breast cancer.  Clinically she is not having symptoms related to her disease.  The MRI of the liver shows evidence of continuing response and her tumor marker has normalized.  She is tolerating treatment generally well.  We are concerned about the osteonecrosis issue and she is being followed closely but at this point we are continuing the  denosumab monthly.  She is using the Decadron rinse to prevent mouth sores from Afinitor.  Despite all the issues she is able to work full-time.  She and her husband are following appropriate pandemic precautions.  I let her know that there are studies available in town for a coronavirus vaccine in case she wanted to participate.  She will be treated today and every 28 days.  She will see [place] again with the October treatment.  She will be restaged in December  She knows to call for any other issue that may develop before the next visit.   Debbie [name], Virgie Dad, MD  12/27/18 8:49 AM Medical Oncology and Hematology [facility] [address] [place], [place] [address] Tel. [phone]    Fax. [phone]   I, Wilburn Mylar, am acting as scribe for Dr. Virgie Dad. Sherial Ebrahim.  I, Lurline Del MD, have reviewed the above documentation for accuracy and completeness, and I agree with the above.

## 2018-12-27 ENCOUNTER — Other Ambulatory Visit: Payer: Self-pay

## 2018-12-27 ENCOUNTER — Encounter: Payer: Self-pay | Admitting: Physical Therapy

## 2018-12-27 ENCOUNTER — Inpatient Hospital Stay (HOSPITAL_BASED_OUTPATIENT_CLINIC_OR_DEPARTMENT_OTHER): Payer: 59 | Admitting: Oncology

## 2018-12-27 ENCOUNTER — Ambulatory Visit: Payer: 59

## 2018-12-27 ENCOUNTER — Inpatient Hospital Stay: Payer: 59

## 2018-12-27 ENCOUNTER — Ambulatory Visit: Payer: 59 | Admitting: Physical Therapy

## 2018-12-27 ENCOUNTER — Inpatient Hospital Stay: Payer: 59 | Attending: Oncology

## 2018-12-27 ENCOUNTER — Other Ambulatory Visit: Payer: 59

## 2018-12-27 VITALS — BP 153/75 | HR 115 | Temp 98.9°F | Resp 18 | Ht 66.5 in | Wt 267.8 lb

## 2018-12-27 VITALS — BP 152/72 | HR 92 | Temp 98.6°F | Resp 18

## 2018-12-27 DIAGNOSIS — Z170 Estrogen receptor positive status [ER+]: Secondary | ICD-10-CM

## 2018-12-27 DIAGNOSIS — C7951 Secondary malignant neoplasm of bone: Secondary | ICD-10-CM | POA: Diagnosis not present

## 2018-12-27 DIAGNOSIS — Z7189 Other specified counseling: Secondary | ICD-10-CM | POA: Diagnosis not present

## 2018-12-27 DIAGNOSIS — Z9013 Acquired absence of bilateral breasts and nipples: Secondary | ICD-10-CM | POA: Insufficient documentation

## 2018-12-27 DIAGNOSIS — C50011 Malignant neoplasm of nipple and areola, right female breast: Secondary | ICD-10-CM

## 2018-12-27 DIAGNOSIS — R293 Abnormal posture: Secondary | ICD-10-CM

## 2018-12-27 DIAGNOSIS — C50112 Malignant neoplasm of central portion of left female breast: Secondary | ICD-10-CM | POA: Diagnosis not present

## 2018-12-27 DIAGNOSIS — C787 Secondary malignant neoplasm of liver and intrahepatic bile duct: Secondary | ICD-10-CM | POA: Insufficient documentation

## 2018-12-27 DIAGNOSIS — C50211 Malignant neoplasm of upper-inner quadrant of right female breast: Secondary | ICD-10-CM

## 2018-12-27 DIAGNOSIS — I972 Postmastectomy lymphedema syndrome: Secondary | ICD-10-CM

## 2018-12-27 DIAGNOSIS — Z79811 Long term (current) use of aromatase inhibitors: Secondary | ICD-10-CM | POA: Diagnosis not present

## 2018-12-27 DIAGNOSIS — R918 Other nonspecific abnormal finding of lung field: Secondary | ICD-10-CM | POA: Diagnosis not present

## 2018-12-27 DIAGNOSIS — C50012 Malignant neoplasm of nipple and areola, left female breast: Secondary | ICD-10-CM

## 2018-12-27 LAB — CBC WITH DIFFERENTIAL/PLATELET
Abs Immature Granulocytes: 0.03 10*3/uL (ref 0.00–0.07)
Basophils Absolute: 0 10*3/uL (ref 0.0–0.1)
Basophils Relative: 0 %
Eosinophils Absolute: 0.1 10*3/uL (ref 0.0–0.5)
Eosinophils Relative: 3 %
HCT: 34.5 % — ABNORMAL LOW (ref 36.0–46.0)
Hemoglobin: 11.2 g/dL — ABNORMAL LOW (ref 12.0–15.0)
Immature Granulocytes: 1 %
Lymphocytes Relative: 8 %
Lymphs Abs: 0.4 10*3/uL — ABNORMAL LOW (ref 0.7–4.0)
MCH: 27.7 pg (ref 26.0–34.0)
MCHC: 32.5 g/dL (ref 30.0–36.0)
MCV: 85.2 fL (ref 80.0–100.0)
Monocytes Absolute: 0.3 10*3/uL (ref 0.1–1.0)
Monocytes Relative: 8 %
Neutro Abs: 3.6 10*3/uL (ref 1.7–7.7)
Neutrophils Relative %: 80 %
Platelets: 304 10*3/uL (ref 150–400)
RBC: 4.05 MIL/uL (ref 3.87–5.11)
RDW: 14.8 % (ref 11.5–15.5)
WBC: 4.5 10*3/uL (ref 4.0–10.5)
nRBC: 0.4 % — ABNORMAL HIGH (ref 0.0–0.2)

## 2018-12-27 LAB — COMPREHENSIVE METABOLIC PANEL
ALT: 33 U/L (ref 0–44)
AST: 29 U/L (ref 15–41)
Albumin: 3.2 g/dL — ABNORMAL LOW (ref 3.5–5.0)
Alkaline Phosphatase: 154 U/L — ABNORMAL HIGH (ref 38–126)
Anion gap: 13 (ref 5–15)
BUN: 12 mg/dL (ref 6–20)
CO2: 24 mmol/L (ref 22–32)
Calcium: 9.6 mg/dL (ref 8.9–10.3)
Chloride: 105 mmol/L (ref 98–111)
Creatinine, Ser: 0.96 mg/dL (ref 0.44–1.00)
GFR calc Af Amer: >60 mL/min (ref >60)
GFR calc non Af Amer: >60 mL/min (ref >60)
Glucose, Bld: 138 mg/dL — ABNORMAL HIGH (ref 70–99)
Potassium: 3.7 mmol/L (ref 3.5–5.1)
Sodium: 142 mmol/L (ref 135–145)
Total Bilirubin: 0.3 mg/dL (ref 0.3–1.2)
Total Protein: 7.2 g/dL (ref 6.5–8.1)

## 2018-12-27 MED ORDER — DENOSUMAB 120 MG/1.7ML ~~LOC~~ SOLN
120.0000 mg | Freq: Once | SUBCUTANEOUS | Status: AC
  Administered 2018-12-27: 120 mg via SUBCUTANEOUS

## 2018-12-27 MED ORDER — DENOSUMAB 120 MG/1.7ML ~~LOC~~ SOLN
SUBCUTANEOUS | Status: AC
  Filled 2018-12-27: qty 1.7

## 2018-12-27 NOTE — Therapy (Signed)
[place], [place], [address] Phone: [phone]   Fax:  [phone]  Physical Therapy Treatment  Patient Details  Name: Debbie [name] MRN: [id number] Date of Birth: July 07, 1965 Referring Provider (PT): Dr. Marlou Starks   Encounter Date: 12/27/2018  PT End of Session - 12/27/18 1223    Visit Number  7    Number of Visits  9    Date for PT Re-Evaluation  01/19/19    PT Start Time  1133    PT Stop Time  1217    PT Time Calculation (min)  44 min    Activity Tolerance  Patient tolerated treatment well    Behavior During Therapy  [facility] for tasks assessed/performed       Past Medical History:  Diagnosis Date  . Anxiety   . Arthritis   . Bone metastasis ([place])   . Breast cancer of upper-inner quadrant of right female breast [facility]) oncologist-  dr [name]--- 04/ 2019 ER/PR+ Stage IV w/ metastatic disease to liver, total spine, lung, bone   dx 06/ 2017  right breast invasive ductal carcinoma, Stage IIB, ER/PR + (pT2 pN1), grade 1-- s/p  right breast lumpecotmy w/ sln bx's and bilateral breast reduction 11-28-2015/  left breast with reduction , dx invasive ductal ca, Grade 1 (pT1cpNX) ER/PR positive/  adjuvant radiation completed 03-27-2016 to right chest wall, 2018  reclassifiec Stage IIA  . Cancer, metastatic to liver ([place])   . Chronic back pain   . Constipation   . Depression   . Family history of adverse reaction to anesthesia     mother has Copd was kept on vent   . Headache   . Heart murmur   . History of hyperthyroidism    s/p  RAI  . History of radiation therapy 02/11/16- 03/27/16   Right Chest Wall 50.4 Gy in 28 fractions, Right Chest Wall Boost 10 Gy in 5 fractions.   . Hyperlipidemia   . Hypertension   . Hypothyroidism, postradioiodine therapy    endocrinoloigst-  dr balan--  2004  s/p  RAI  . Menorrhagia 2011  . Multiple pulmonary nodules    secondary  metastatic  . PONV (postoperative nausea  and vomiting)    patch helps   . SUI (stress urinary incontinence, female)   . Wears contact lenses   . Wears dentures    full upper and lower partial    Past Surgical History:  Procedure Laterality Date  . BREAST LUMPECTOMY WITH NEEDLE LOCALIZATION AND AXILLARY SENTINEL LYMPH NODE BX Right 11/28/2015   Procedure: RIGHT BREAST LUMPECTOMY WITH NEEDLE LOCALIZATION AND AXILLARY SENTINEL LYMPH NODE BX;  Surgeon: Autumn Messing III, MD;  Location: [place];  Service: General;  Laterality: Right;  . BREAST REDUCTION SURGERY Bilateral 11/28/2015   Procedure: MAMMARY REDUCTION  (BREAST) BILATERAL;  Surgeon: Crissie Reese, MD;  Location: [place];  Service: Plastics;  Laterality: Bilateral;  . CESAREAN SECTION  01/12/1993  . COLONOSCOPY    . Dental surgeries    . FEMUR IM NAIL Left 01/28/2018   Procedure: INTRAMEDULLARY (IM) NAIL FEMORAL;  Surgeon: Nicholes Stairs, MD;  Location: [place];  Service: Orthopedics;  Laterality: Left;  . HYSTEROSCOPY N/A 09/02/2017   Procedure: HYSTEROSCOPY  to remove IUD;  Surgeon: Eldred Manges, MD;  Location: [facility];  Service: Gynecology;  Laterality: N/A;  . Liver biospy    . MASTECTOMY    . MASTECTOMY W/ SENTINEL NODE BIOPSY Bilateral 12/26/2015  Procedure: BILATERAL MASTECTOMY WITH LEFT SENTINEL LYMPH NODE BIOPSY;  Surgeon: Autumn Messing III, MD;  Location: [place];  Service: General;  Laterality: Bilateral;  . Removal of IUD    . TUBAL LIGATION Bilateral 1995    There were no vitals filed for this visit.  Subjective Assessment - 12/27/18 1135    Subjective  I used my glove and it was aweseome working with it. The finger bandages come off.    Pertinent History  11/26/15- right lumpectomy, SLNB, and breast reduction and sent tissue from left breast for biopsy, 12/26/15- bilateral mastectomy and SLNB on L, pt completed radiation 08/12/17- pt diagnosed with stage 4 breast cancer in bone, liver and lung, pt is currently undergoing chemotherapy, Oct 2019- pt  had to undergo L medullary femur nailing due to impending pathologic fracture from bone mets- completed PT for 2 weeks, 2 weeks of radiation to femur, pt developed neuropathy of bilateral LEs in Dec 2019    Patient Stated Goals  to get swelling down in arm and get it controlled    Currently in Pain?  No/denies                  Outpatient Rehab from [facility]/08/[facility]  Lymphedema Life Impact Scale Total Score  11.76 %           [facility] Adult PT Treatment/Exercise - 12/27/18 0001      Manual Therapy   Manual Lymphatic Drainage (MLD)  In supine: short neck, 5 diaphragmatic breaths, right inguinal nodes and establishment of axillo inguinal pathway, R UE working proximal to distal then retracing all steps    Compression Bandaging  lotion, thick stockinette from wrist to axilla, artiflex from wrist to axilla, 1 - 8 cm bandage from wrist to axilla, 1 -10 cm bandage in herringbone pattern then another 10 cm bandage, then pt donned her glove for managment of hand swelling. Gave pt elastomull and 6 cm bandage to bandage hand if the glove does not work                  PT Long Term Goals - 12/08/18 1657      PT LONG TERM GOAL #1   Title  Pt will demonstrate maximal reduction of edema in RUE as evidenced by stabilization of circumferential measurements    Time  6    Period  Weeks    Status  New    Target Date  01/19/19      PT LONG TERM GOAL #2   Title  Pt will obtain appropriate compression garments for long term management of RUE lymphedema    Time  6    Period  Weeks    Status  New    Target Date  01/19/19      PT LONG TERM GOAL #3   Title  Pt will be independent in self MLD for long term management of lymphedema    Time  4    Period  Weeks    Status  New    Target Date  01/19/19      PT LONG TERM GOAL #4   Title  Pt's husband will be able to independently don pt's compression bandages to help pt reach maximal reduction     Time  6    Period  Weeks    Status  New    Target Date  01/19/19      PT LONG TERM GOAL #5   Title  Pt will be able to independently verbalize lymphedema risk reduction practices    Time  6    Period  Weeks    Status  New    Target Date  01/19/19            Plan - 12/27/18 1224    Clinical Impression Statement  Pt states the fingers banages come off very fast when she is at work because she is constantly using her hands. Performed MLD and compression bandaging today but only bandaged from wrist to axill and had pt don her old glove on her hand to see if this works better. Issued pt hand bandage and elastomull in case she needs to bandage her hand.    Comorbidities  metastatic disease, hx of radiation, neuropathy    Rehab Potential  Good    PT Frequency  2x / week    PT Duration  4 weeks    PT Treatment/Interventions  ADLs/Self Care Home Management;Manual techniques;Manual lymph drainage;Compression bandaging;Taping;Vasopneumatic Device;Patient/family education;Therapeutic exercise    PT Next Visit Plan  see if glove helped hand swelling, Assess if bandaing changes helped her.  consider possibly coban to fingers if they are still swelling. Cont with complete decongestive therapy and issue handout for self MLD(and review and have her return dmeo) for pt to do when her bandages are off. Instruct in lymphedema risk reductions put pt on the schedule for [facility] for measuring.consider flexitouch and send demographics if teammates agree    PT Home Exercise Plan  compression bandaging    Consulted and Agree with Plan of Care  Patient       Patient will benefit from skilled therapeutic intervention in order to improve the following deficits and impairments:  Postural dysfunction, Increased edema, Decreased knowledge of precautions  Visit Diagnosis: 1. Postmastectomy lymphedema        Problem List Patient Active Problem List   Diagnosis Date Noted  . Goals of care,  counseling/discussion 12/27/2018  . Osteonecrosis ([place]) 11/01/2018  . Pathological fracture in neoplastic disease, left femur, initial encounter for fracture ([place]) 01/28/2018  . Morbid obesity with BMI of 40.0-44.9, adult ([place]) 10/12/2017  . IUD complication ([place]) 29/79/8921  . Bone metastases ([place]) 08/13/2017  . Pain from bone metastases ([place]) 08/13/2017  . Liver metastases ([place]) 08/13/2017  . Steatosis of liver 08/13/2017  . Bilateral breast cancer ([place]) 12/26/2015  . Malignant neoplasm of central portion of left breast in female, estrogen receptor positive ([place]) 11/28/2015  . Malignant neoplasm of upper-inner quadrant of right breast in female, estrogen receptor positive ([place]) 10/17/2015  . Hypertension 04/19/2012  . Hypothyroid 04/19/2012  . Fibroids 04/19/2012  . Stress incontinence, female 04/19/2012    Allyson Sabal [facility] 12/27/2018, 12:26 PM  [place], [place], [address] Phone: [phone]   Fax:  [phone]  Name: Debbie [name] MRN: [id number] Date of Birth: [birth year]  Manus Gunning, PT 12/27/18 12:26 PM

## 2018-12-27 NOTE — Patient Instructions (Signed)

## 2018-12-28 LAB — CANCER ANTIGEN 27.29: CA 27.29: 34.4 U/mL (ref 0.0–38.6)

## 2018-12-29 ENCOUNTER — Ambulatory Visit: Payer: 59 | Admitting: Physical Therapy

## 2018-12-29 ENCOUNTER — Other Ambulatory Visit: Payer: Self-pay

## 2018-12-29 ENCOUNTER — Telehealth: Payer: Self-pay | Admitting: Oncology

## 2018-12-29 ENCOUNTER — Encounter: Payer: Self-pay | Admitting: Physical Therapy

## 2018-12-29 DIAGNOSIS — R918 Other nonspecific abnormal finding of lung field: Secondary | ICD-10-CM | POA: Diagnosis not present

## 2018-12-29 DIAGNOSIS — I972 Postmastectomy lymphedema syndrome: Secondary | ICD-10-CM

## 2018-12-29 DIAGNOSIS — C787 Secondary malignant neoplasm of liver and intrahepatic bile duct: Secondary | ICD-10-CM | POA: Diagnosis not present

## 2018-12-29 DIAGNOSIS — Z170 Estrogen receptor positive status [ER+]: Secondary | ICD-10-CM | POA: Diagnosis not present

## 2018-12-29 DIAGNOSIS — Z79811 Long term (current) use of aromatase inhibitors: Secondary | ICD-10-CM | POA: Diagnosis not present

## 2018-12-29 DIAGNOSIS — Z9013 Acquired absence of bilateral breasts and nipples: Secondary | ICD-10-CM | POA: Diagnosis not present

## 2018-12-29 DIAGNOSIS — C50211 Malignant neoplasm of upper-inner quadrant of right female breast: Secondary | ICD-10-CM | POA: Diagnosis not present

## 2018-12-29 DIAGNOSIS — C7951 Secondary malignant neoplasm of bone: Secondary | ICD-10-CM | POA: Diagnosis not present

## 2018-12-29 MED FILL — CHLORHEXIDINE 0.12% RINSE: 0.12 | 17 days supply | Qty: 473 | Fill #0

## 2018-12-29 MED FILL — CLINDAMYCIN HCL 150 MG CAPS: 150 | 7 days supply | Qty: 28 | Fill #0

## 2018-12-29 NOTE — Telephone Encounter (Signed)
I talk with patient regarding schedule  

## 2018-12-29 NOTE — Therapy (Signed)
[place], [place], [address] Phone: [phone]   Fax:  [phone]  Physical Therapy Treatment  Patient Details  Name: Debbie [name] MRN: [id number] Date of Birth: 09-04-1965 Referring Provider (PT): Dr. Marlou Starks   Encounter Date: 12/29/2018  PT End of Session - 12/29/18 1549    Visit Number  8    Date for PT Re-Evaluation  01/19/19    PT Start Time  1457    PT Stop Time  1545    PT Time Calculation (min)  48 min    Activity Tolerance  Patient tolerated treatment well    Behavior During Therapy  [facility] for tasks assessed/performed       Past Medical History:  Diagnosis Date  . Anxiety   . Arthritis   . Bone metastasis ([place])   . Breast cancer of upper-inner quadrant of right female breast [facility]) oncologist-  dr [name]--- 04/ 2019 ER/PR+ Stage IV w/ metastatic disease to liver, total spine, lung, bone   dx 06/ 2017  right breast invasive ductal carcinoma, Stage IIB, ER/PR + (pT2 pN1), grade 1-- s/p  right breast lumpecotmy w/ sln bx's and bilateral breast reduction 11-28-2015/  left breast with reduction , dx invasive ductal ca, Grade 1 (pT1cpNX) ER/PR positive/  adjuvant radiation completed 03-27-2016 to right chest wall, 2018  reclassifiec Stage IIA  . Cancer, metastatic to liver ([place])   . Chronic back pain   . Constipation   . Depression   . Family history of adverse reaction to anesthesia     mother has Copd was kept on vent   . Headache   . Heart murmur   . History of hyperthyroidism    s/p  RAI  . History of radiation therapy 02/11/16- 03/27/16   Right Chest Wall 50.4 Gy in 28 fractions, Right Chest Wall Boost 10 Gy in 5 fractions.   . Hyperlipidemia   . Hypertension   . Hypothyroidism, postradioiodine therapy    endocrinoloigst-  dr balan--  2004  s/p  RAI  . Menorrhagia 2011  . Multiple pulmonary nodules    secondary  metastatic  . PONV (postoperative nausea and vomiting)    patch  helps   . SUI (stress urinary incontinence, female)   . Wears contact lenses   . Wears dentures    full upper and lower partial    Past Surgical History:  Procedure Laterality Date  . BREAST LUMPECTOMY WITH NEEDLE LOCALIZATION AND AXILLARY SENTINEL LYMPH NODE BX Right 11/28/2015   Procedure: RIGHT BREAST LUMPECTOMY WITH NEEDLE LOCALIZATION AND AXILLARY SENTINEL LYMPH NODE BX;  Surgeon: Autumn Messing III, MD;  Location: [place];  Service: General;  Laterality: Right;  . BREAST REDUCTION SURGERY Bilateral 11/28/2015   Procedure: MAMMARY REDUCTION  (BREAST) BILATERAL;  Surgeon: Crissie Reese, MD;  Location: [place];  Service: Plastics;  Laterality: Bilateral;  . CESAREAN SECTION  01/12/1993  . COLONOSCOPY    . Dental surgeries    . FEMUR IM NAIL Left 01/28/2018   Procedure: INTRAMEDULLARY (IM) NAIL FEMORAL;  Surgeon: Nicholes Stairs, MD;  Location: [place];  Service: Orthopedics;  Laterality: Left;  . HYSTEROSCOPY N/A 09/02/2017   Procedure: HYSTEROSCOPY  to remove IUD;  Surgeon: Eldred Manges, MD;  Location: [facility];  Service: Gynecology;  Laterality: N/A;  . Liver biospy    . MASTECTOMY    . MASTECTOMY W/ SENTINEL NODE BIOPSY Bilateral 12/26/2015   Procedure: BILATERAL MASTECTOMY WITH LEFT SENTINEL  LYMPH NODE BIOPSY;  Surgeon: Autumn Messing III, MD;  Location: [place];  Service: General;  Laterality: Bilateral;  . Removal of IUD    . TUBAL LIGATION Bilateral 1995    There were no vitals filed for this visit.  Subjective Assessment - 12/29/18 1502    Subjective  I wore the glove and it worked well. I did have to apply the glove first and then then bandages because the other way made the bandages bunch up.    Pertinent History  11/26/15- right lumpectomy, SLNB, and breast reduction and sent tissue from left breast for biopsy, 12/26/15- bilateral mastectomy and SLNB on L, pt completed radiation 08/12/17- pt diagnosed with stage 4 breast cancer in bone, liver and lung, pt is currently  undergoing chemotherapy, Oct 2019- pt had to undergo L medullary femur nailing due to impending pathologic fracture from bone mets- completed PT for 2 weeks, 2 weeks of radiation to femur, pt developed neuropathy of bilateral LEs in Dec 2019    Patient Stated Goals  to get swelling down in arm and get it controlled    Currently in Pain?  No/denies                  Outpatient Rehab from 12/08/2018 [facility]  Lymphedema Life Impact Scale Total Score  11.76 %           [facility] Adult PT Treatment/Exercise - 12/29/18 0001      Manual Therapy   Manual Lymphatic Drainage (MLD)  In supine: short neck, 5 diaphragmatic breaths, right inguinal nodes and establishment of axillo inguinal pathway, R UE working proximal to distal then retracing all steps    Compression Bandaging  lotion, pt donned compression glove then thick stockinette from hand to axilla, artiflex from wrist to axilla, 1 6 cm bandage over glove at hand while wrapping more loosely around wrist since this is were glove has velcro, 1 - 8 cm bandage from wrist to axilla, 1 -10 cm bandage in herringbone pattern then another 10 cm bandage                  PT Long Term Goals - 12/08/18 1657      PT LONG TERM GOAL #1   Title  Pt will demonstrate maximal reduction of edema in RUE as evidenced by stabilization of circumferential measurements    Time  6    Period  Weeks    Status  New    Target Date  01/19/19      PT LONG TERM GOAL #2   Title  Pt will obtain appropriate compression garments for long term management of RUE lymphedema    Time  6    Period  Weeks    Status  New    Target Date  01/19/19      PT LONG TERM GOAL #3   Title  Pt will be independent in self MLD for long term management of lymphedema    Time  4    Period  Weeks    Status  New    Target Date  01/19/19      PT LONG TERM GOAL #4   Title  Pt's husband will be able to independently don pt's compression  bandages to help pt reach maximal reduction    Time  6    Period  Weeks    Status  New    Target Date  01/19/19      PT  LONG TERM GOAL #5   Title  Pt will be able to independently verbalize lymphedema risk reduction practices    Time  6    Period  Weeks    Status  New    Target Date  01/19/19            Plan - 12/29/18 1549    Clinical Impression Statement  Pt added hand bandage over velcro glove because she did not feel the glove provided enough compression. She wanted to continue with this today. Continued MLD and bandaging but used glove at hand then applied bandages over glove. Wrapped wrist more loosly since there is a velcro strap on the glove in this area. 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    Rehab Potential  Good    PT Frequency  2x / week    PT Duration  4 weeks    PT Treatment/Interventions  ADLs/Self Care Home Management;Manual techniques;Manual lymph drainage;Compression bandaging;Taping;Vasopneumatic Device;Patient/family education;Therapeutic exercise    PT Next Visit Plan  see if glove helped hand swelling, Assess if bandaing changes helped her.  consider possibly coban to fingers if they are still swelling. Cont with complete decongestive therapy and issue handout for self MLD(and review and have her return dmeo) for pt to do when her bandages are off. Instruct in lymphedema risk reductions put pt on the schedule for [facility] for measuring.consider flexitouch and send demographics if teammates agree    Consulted and Agree with Plan of Care  Patient       Patient will benefit from skilled therapeutic intervention in order to improve the following deficits and impairments:  Postural dysfunction, Increased edema, Decreased knowledge of precautions  Visit Diagnosis: Postmastectomy lymphedema     Problem List Patient Active Problem List   Diagnosis Date Noted  . Goals of care,  counseling/discussion 12/27/2018  . Osteonecrosis ([place]) 11/01/2018  . Pathological fracture in neoplastic disease, left femur, initial encounter for fracture ([place]) 01/28/2018  . Morbid obesity with BMI of 40.0-44.9, adult ([place]) 10/12/2017  . IUD complication ([place]) 71/69/6789  . Bone metastases ([place]) 08/13/2017  . Pain from bone metastases ([place]) 08/13/2017  . Liver metastases ([place]) 08/13/2017  . Steatosis of liver 08/13/2017  . Bilateral breast cancer ([place]) 12/26/2015  . Malignant neoplasm of central portion of left breast in female, estrogen receptor positive ([place]) 11/28/2015  . Malignant neoplasm of upper-inner quadrant of right breast in female, estrogen receptor positive ([place]) 10/17/2015  . Hypertension 04/19/2012  . Hypothyroid 04/19/2012  . Fibroids 04/19/2012  . Stress incontinence, female 04/19/2012    [place] 12/29/2018, 3:51 PM  [place], [place], [address] Phone: [phone]   Fax:  [phone]  Name: MALVINA SCHADLER MRN: [id number] Date of Birth: 05-Jul-1965  Manus Gunning, PT 12/29/18 3:52 PM

## 2019-01-03 ENCOUNTER — Other Ambulatory Visit: Payer: Self-pay

## 2019-01-03 ENCOUNTER — Encounter: Payer: Self-pay | Admitting: Physical Therapy

## 2019-01-03 ENCOUNTER — Ambulatory Visit: Payer: 59 | Admitting: Physical Therapy

## 2019-01-03 DIAGNOSIS — C787 Secondary malignant neoplasm of liver and intrahepatic bile duct: Secondary | ICD-10-CM | POA: Diagnosis not present

## 2019-01-03 DIAGNOSIS — Z79811 Long term (current) use of aromatase inhibitors: Secondary | ICD-10-CM | POA: Diagnosis not present

## 2019-01-03 DIAGNOSIS — Z9013 Acquired absence of bilateral breasts and nipples: Secondary | ICD-10-CM | POA: Diagnosis not present

## 2019-01-03 DIAGNOSIS — Z760 Encounter for issue of repeat prescription: Secondary | ICD-10-CM | POA: Diagnosis not present

## 2019-01-03 DIAGNOSIS — C7951 Secondary malignant neoplasm of bone: Secondary | ICD-10-CM | POA: Diagnosis not present

## 2019-01-03 DIAGNOSIS — C50211 Malignant neoplasm of upper-inner quadrant of right female breast: Secondary | ICD-10-CM | POA: Diagnosis not present

## 2019-01-03 DIAGNOSIS — Z170 Estrogen receptor positive status [ER+]: Secondary | ICD-10-CM | POA: Diagnosis not present

## 2019-01-03 DIAGNOSIS — R918 Other nonspecific abnormal finding of lung field: Secondary | ICD-10-CM | POA: Diagnosis not present

## 2019-01-03 DIAGNOSIS — I972 Postmastectomy lymphedema syndrome: Secondary | ICD-10-CM

## 2019-01-03 MED FILL — LOSARTAN POTASSIUM 100 MG T: 100 | 30 days supply | Qty: 30 | Fill #6

## 2019-01-03 NOTE — Therapy (Signed)
[place], [place], [address] Phone: [phone]   Fax:  [phone]  Physical Therapy Treatment  Patient Details  Name: Debbie [name] MRN: [id number] Date of Birth: [birth year] Referring Provider (PT): Dr. Marlou Starks   Encounter Date: 01/03/2019  PT End of Session - 01/03/19 1557    Visit Number  8    Number of Visits  9    Date for PT Re-Evaluation  01/19/19    PT Start Time  [id number]    PT Stop Time  1550    PT Time Calculation (min)  43 min    Activity Tolerance  Patient tolerated treatment well    Behavior During Therapy  [facility] for tasks assessed/performed       Past Medical History:  Diagnosis Date  . Anxiety   . Arthritis   . Bone metastasis ([place])   . Breast cancer of upper-inner quadrant of right female breast [facility]) oncologist-  dr [name]--- 04/ 2019 ER/PR+ Stage IV w/ metastatic disease to liver, total spine, lung, bone   dx 06/ 2017  right breast invasive ductal carcinoma, Stage IIB, ER/PR + (pT2 pN1), grade 1-- s/p  right breast lumpecotmy w/ sln bx's and bilateral breast reduction 11-28-2015/  left breast with reduction , dx invasive ductal ca, Grade 1 (pT1cpNX) ER/PR positive/  adjuvant radiation completed 03-27-2016 to right chest wall, 2018  reclassifiec Stage IIA  . Cancer, metastatic to liver ([place])   . Chronic back pain   . Constipation   . Depression   . Family history of adverse reaction to anesthesia     mother has Copd was kept on vent   . Headache   . Heart murmur   . History of hyperthyroidism    s/p  RAI  . History of radiation therapy 02/11/16- 03/27/16   Right Chest Wall 50.4 Gy in 28 fractions, Right Chest Wall Boost 10 Gy in 5 fractions.   . Hyperlipidemia   . Hypertension   . Hypothyroidism, postradioiodine therapy    endocrinoloigst-  dr balan--  2004  s/p  RAI  . Menorrhagia 2011  . Multiple pulmonary nodules    secondary  metastatic  . PONV (postoperative nausea  and vomiting)    patch helps   . SUI (stress urinary incontinence, female)   . Wears contact lenses   . Wears dentures    full upper and lower partial    Past Surgical History:  Procedure Laterality Date  . BREAST LUMPECTOMY WITH NEEDLE LOCALIZATION AND AXILLARY SENTINEL LYMPH NODE BX Right 11/28/2015   Procedure: RIGHT BREAST LUMPECTOMY WITH NEEDLE LOCALIZATION AND AXILLARY SENTINEL LYMPH NODE BX;  Surgeon: Autumn Messing III, MD;  Location: [place];  Service: General;  Laterality: Right;  . BREAST REDUCTION SURGERY Bilateral 11/28/2015   Procedure: MAMMARY REDUCTION  (BREAST) BILATERAL;  Surgeon: Crissie Reese, MD;  Location: [place];  Service: Plastics;  Laterality: Bilateral;  . CESAREAN SECTION  01/12/1993  . COLONOSCOPY    . Dental surgeries    . FEMUR IM NAIL Left 01/28/2018   Procedure: INTRAMEDULLARY (IM) NAIL FEMORAL;  Surgeon: Nicholes Stairs, MD;  Location: [place];  Service: Orthopedics;  Laterality: Left;  . HYSTEROSCOPY N/A 09/02/2017   Procedure: HYSTEROSCOPY  to remove IUD;  Surgeon: Eldred Manges, MD;  Location: [facility];  Service: Gynecology;  Laterality: N/A;  . Liver biospy    . MASTECTOMY    . MASTECTOMY W/ SENTINEL NODE BIOPSY Bilateral 12/26/2015  Procedure: BILATERAL MASTECTOMY WITH LEFT SENTINEL LYMPH NODE BIOPSY;  Surgeon: Autumn Messing III, MD;  Location: [facility];  Service: General;  Laterality: Bilateral;  . Removal of IUD    . TUBAL LIGATION Bilateral 1995    There were no vitals filed for this visit.  Subjective Assessment - 01/03/19 1507    Subjective  I found another glove that is more comfortable and easier to wear at work.    Pertinent History  11/26/15- right lumpectomy, SLNB, and breast reduction and sent tissue from left breast for biopsy, 12/26/15- bilateral mastectomy and SLNB on L, pt completed radiation 08/12/17- pt diagnosed with stage 4 breast cancer in bone, liver and lung, pt is currently undergoing chemotherapy, Oct 2019- pt had to  undergo L medullary femur nailing due to impending pathologic fracture from bone mets- completed PT for 2 weeks, 2 weeks of radiation to femur, pt developed neuropathy of bilateral LEs in Dec 2019    Patient Stated Goals  to get swelling down in arm and get it controlled    Currently in Pain?  No/denies                  Outpatient Re[facility] from [facility]  Lymphedema Life Impact Scale Total Score  11.76 %           [facility] Adult PT Treatment/Exercise - 01/03/19 0001      Manual Therapy   Manual Lymphatic Drainage (MLD)  In supine: short neck, 5 diaphragmatic breaths, right inguinal nodes and establishment of axillo inguinal pathway, R UE working proximal to distal then retracing all steps    Compression Bandaging  Thick lotion, thick stockinette, artiflex from wrist to axilla,  2  Elastomull to fingers 1-4, 1-6, 2-10 and 1-12 cm short stretch compression bandages from hand to axilla  with herringbone with 10 cm at forearm                   PT Long Term Goals - 12/08/18 1657      PT LONG TERM GOAL #1   Title  Pt will demonstrate maximal reduction of edema in RUE as evidenced by stabilization of circumferential measurements    Time  6    Period  Weeks    Status  New    Target Date  01/19/19      PT LONG TERM GOAL #2   Title  Pt will obtain appropriate compression garments for long term management of RUE lymphedema    Time  6    Period  Weeks    Status  New    Target Date  01/19/19      PT LONG TERM GOAL #3   Title  Pt will be independent in self MLD for long term management of lymphedema    Time  4    Period  Weeks    Status  New    Target Date  01/19/19      PT LONG TERM GOAL #4   Title  Pt's husband will be able to independently don pt's compression bandages to help pt reach maximal reduction    Time  6    Period  Weeks    Status  New    Target Date  01/19/19      PT LONG TERM GOAL #5   Title  Pt  will be able to independently verbalize lymphedema risk reduction practices    Time  6    Period  Weeks    Status  New    Target Date  01/19/19            Plan - 01/03/19 1554    Clinical Impression Statement  Pt had a different glove donned today with compression bandages. She had some increase in hand swelling at knuckles so wrapped pt's hand today with bandages and fingers with elastomull. Continued MLD and bandaging.    Rehab Potential  Good    PT Frequency  2x / week    PT Duration  4 weeks    PT Treatment/Interventions  ADLs/Self Care Home Management;Manual techniques;Manual lymph drainage;Compression bandaging;Taping;Vasopneumatic Device;Patient/family education;Therapeutic exercise    PT Next Visit Plan  Cont with complete decongestive therapy and issue handout for self MLD(and review and have her return dmeo) for pt to do when her bandages are off. Instruct in lymphedema risk reductions put pt on the schedule for [facility] for measuring.consider flexitouch and send demographics if teammates agree    Consulted and Agree with Plan of Care  Patient       Patient will benefit from skilled therapeutic intervention in order to improve the following deficits and impairments:  Postural dysfunction, Increased edema, Decreased knowledge of precautions  Visit Diagnosis: Postmastectomy lymphedema     Problem List Patient Active Problem List   Diagnosis Date Noted  . Goals of care, counseling/discussion 12/27/2018  . Osteonecrosis ([place]) 11/01/2018  . Pathological fracture in neoplastic disease, left femur, initial encounter for fracture ([place]) 01/28/2018  . Morbid obesity with BMI of 40.0-44.9, adult ([place]) 10/12/2017  . IUD complication ([place]) 123XX123  . Bone metastases ([place]) 08/13/2017  . Pain from bone metastases ([place]) 08/13/2017  . Liver metastases ([place]) 08/13/2017  . Steatosis of liver 08/13/2017  . Bilateral breast cancer ([place]) 12/26/2015  . Malignant neoplasm of central  portion of left breast in female, estrogen receptor positive ([place]) 11/28/2015  . Malignant neoplasm of upper-inner quadrant of right breast in female, estrogen receptor positive ([place]) 10/17/2015  . Hypertension 04/19/2012  . Hypothyroid 04/19/2012  . Fibroids 04/19/2012  . Stress incontinence, female 04/19/2012    [place] 01/03/2019, 3:58 PM  [place], [place], [address] Phone: [phone]   Fax:  [phone]  Name: ASHLEYN AGNE MRN: [id number] Date of Birth: Jan 22, 1966  Manus Gunning, PT 01/03/19 3:58 PM

## 2019-01-05 ENCOUNTER — Other Ambulatory Visit: Payer: Self-pay

## 2019-01-05 ENCOUNTER — Other Ambulatory Visit: Payer: Self-pay | Admitting: Oncology

## 2019-01-05 ENCOUNTER — Ambulatory Visit: Payer: 59

## 2019-01-05 DIAGNOSIS — R918 Other nonspecific abnormal finding of lung field: Secondary | ICD-10-CM | POA: Diagnosis not present

## 2019-01-05 DIAGNOSIS — C787 Secondary malignant neoplasm of liver and intrahepatic bile duct: Secondary | ICD-10-CM | POA: Diagnosis not present

## 2019-01-05 DIAGNOSIS — Z9013 Acquired absence of bilateral breasts and nipples: Secondary | ICD-10-CM | POA: Diagnosis not present

## 2019-01-05 DIAGNOSIS — C50211 Malignant neoplasm of upper-inner quadrant of right female breast: Secondary | ICD-10-CM | POA: Diagnosis not present

## 2019-01-05 DIAGNOSIS — R293 Abnormal posture: Secondary | ICD-10-CM

## 2019-01-05 DIAGNOSIS — Z170 Estrogen receptor positive status [ER+]: Secondary | ICD-10-CM

## 2019-01-05 DIAGNOSIS — I972 Postmastectomy lymphedema syndrome: Secondary | ICD-10-CM

## 2019-01-05 DIAGNOSIS — C7951 Secondary malignant neoplasm of bone: Secondary | ICD-10-CM | POA: Diagnosis not present

## 2019-01-05 DIAGNOSIS — Z79811 Long term (current) use of aromatase inhibitors: Secondary | ICD-10-CM | POA: Diagnosis not present

## 2019-01-05 NOTE — Therapy (Signed)
[place], [place], [address] Phone: [phone]   Fax:  [phone]  Physical Therapy Treatment  Patient Details  Name: Debbie [name] MRN: [id number] Date of Birth: 06-05-1965 Referring Provider (PT): Dr. Marlou Starks   Encounter Date: 01/05/2019  PT End of Session - 01/05/19 0957    Visit Number  9    Number of Visits  9    Date for PT Re-Evaluation  01/19/19    PT Start Time  0907   therapist runinglate from previous treatment   PT Stop Time  0950    PT Time Calculation (min)  43 min    Activity Tolerance  Patient tolerated treatment well    Behavior During Therapy  [facility] for tasks assessed/performed       Past Medical History:  Diagnosis Date  . Anxiety   . Arthritis   . Bone metastasis ([place])   . Breast cancer of upper-inner quadrant of right female breast [facility]) oncologist-  dr [name]--- 04/ 2019 ER/PR+ Stage IV w/ metastatic disease to liver, total spine, lung, bone   dx 06/ 2017  right breast invasive ductal carcinoma, Stage IIB, ER/PR + (pT2 pN1), grade 1-- s/p  right breast lumpecotmy w/ sln bx's and bilateral breast reduction 11-28-2015/  left breast with reduction , dx invasive ductal ca, Grade 1 (pT1cpNX) ER/PR positive/  adjuvant radiation completed 03-27-2016 to right chest wall, 2018  reclassifiec Stage IIA  . Cancer, metastatic to liver ([place])   . Chronic back pain   . Constipation   . Depression   . Family history of adverse reaction to anesthesia     mother has Copd was kept on vent   . Headache   . Heart murmur   . History of hyperthyroidism    s/p  RAI  . History of radiation therapy 02/11/16- 03/27/16   Right Chest Wall 50.4 Gy in 28 fractions, Right Chest Wall Boost 10 Gy in 5 fractions.   . Hyperlipidemia   . Hypertension   . Hypothyroidism, postradioiodine therapy    endocrinoloigst-  dr balan--  2004  s/p  RAI  . Menorrhagia 2011  . Multiple pulmonary nodules    secondary  metastatic  . PONV (postoperative nausea and vomiting)    patch helps   . SUI (stress urinary incontinence, female)   . Wears contact lenses   . Wears dentures    full upper and lower partial    Past Surgical History:  Procedure Laterality Date  . BREAST LUMPECTOMY WITH NEEDLE LOCALIZATION AND AXILLARY SENTINEL LYMPH NODE BX Right 11/28/2015   Procedure: RIGHT BREAST LUMPECTOMY WITH NEEDLE LOCALIZATION AND AXILLARY SENTINEL LYMPH NODE BX;  Surgeon: Autumn Messing III, MD;  Location: [place];  Service: General;  Laterality: Right;  . BREAST REDUCTION SURGERY Bilateral 11/28/2015   Procedure: MAMMARY REDUCTION  (BREAST) BILATERAL;  Surgeon: Crissie Reese, MD;  Location: [place];  Service: Plastics;  Laterality: Bilateral;  . CESAREAN SECTION  01/12/1993  . COLONOSCOPY    . Dental surgeries    . FEMUR IM NAIL Left 01/28/2018   Procedure: INTRAMEDULLARY (IM) NAIL FEMORAL;  Surgeon: Nicholes Stairs, MD;  Location: [place];  Service: Orthopedics;  Laterality: Left;  . HYSTEROSCOPY N/A 09/02/2017   Procedure: HYSTEROSCOPY  to remove IUD;  Surgeon: Eldred Manges, MD;  Location: [facility];  Service: Gynecology;  Laterality: N/A;  . Liver biospy    . MASTECTOMY    . MASTECTOMY W/  SENTINEL NODE BIOPSY Bilateral 12/26/2015   Procedure: BILATERAL MASTECTOMY WITH LEFT SENTINEL LYMPH NODE BIOPSY;  Surgeon: Autumn Messing III, MD;  Location: [facility];  Service: General;  Laterality: Bilateral;  . Removal of IUD    . TUBAL LIGATION Bilateral 1995    There were no vitals filed for this visit.  Subjective Assessment - 01/05/19 0910    Subjective  Back on doxycycline bc I'm having surgery tomorrow morning on my jaw bc I've been having pain for about 6 months on and off and I don't have a tooth there so they want to check it. My Rt arm is doing reat though. Rewrapped my arm last night and used the compression foam at the back of my hand.    Pertinent History  11/26/15- right lumpectomy,  SLNB, and breast reduction and sent tissue from left breast for biopsy, 12/26/15- bilateral mastectomy and SLNB on L, pt completed radiation 08/12/17- pt diagnosed with stage 4 breast cancer in bone, liver and lung, pt is currently undergoing chemotherapy, Oct 2019- pt had to undergo L medullary femur nailing due to impending pathologic fracture from bone mets- completed PT for 2 weeks, 2 weeks of radiation to femur, pt developed neuropathy of bilateral LEs in Dec 2019    Patient Stated Goals  to get swelling down in arm and get it controlled    Currently in Pain?  No/denies                  Outpatient Re[facility] from [facility]08/[facility]  Lymphedema Life Impact Scale Total Score  11.76 %           [facility] Adult PT Treatment/Exercise - 01/05/19 0001      Manual Therapy   Manual Lymphatic Drainage (MLD)  In supine: short neck, 5 diaphragmatic breaths, right inguinal nodes and establishment of axillo inguinal pathway, R UE working proximal to distal then retracing all steps    Compression Bandaging  Thick lotion, thick stockinette, artiflex from wrist to axilla, Elastomull to fingers 1-4 then 1-6, 2-10 and 1-12 cm short stretch compression bandages from hand to axilla  with herringbone with 2nd 10 cm                  PT Long Term Goals - 12/08/18 1657      PT LONG TERM GOAL #1   Title  Pt will demonstrate maximal reduction of edema in RUE as evidenced by stabilization of circumferential measurements    Time  6    Period  Weeks    Status  New    Target Date  01/19/19      PT LONG TERM GOAL #2   Title  Pt will obtain appropriate compression garments for long term management of RUE lymphedema    Time  6    Period  Weeks    Status  New    Target Date  01/19/19      PT LONG TERM GOAL #3   Title  Pt will be independent in self MLD for long term management of lymphedema    Time  4    Period  Weeks    Status  New    Target Date   01/19/19      PT LONG TERM GOAL #4   Title  Pt's husband will be able to independently don pt's compression bandages to help pt reach maximal reduction    Time  6    Period  Weeks  Status  New    Target Date  01/19/19      PT LONG TERM GOAL #5   Title  Pt will be able to independently verbalize lymphedema risk reduction practices    Time  6    Period  Weeks    Status  New    Target Date  01/19/19            Plan - 01/05/19 0958    Clinical Impression Statement  Pt had rewrapped her arm last night and reported tightness she had felt in hand was improved this morning when removing bandages here. Continued with complete decongestive therapy and pt has appt here at our clinic to be measured for garments on Monday, 01/09/19.    Personal Factors and Comorbidities  Comorbidity 1;Comorbidity 2    Comorbidities  metastatic disease, hx of radiation, neuropathy    Examination-Participation Restrictions  Other   grasping with fingers due to swelling   Stability/Clinical Decision Making  Evolving/Moderate complexity    Rehab Potential  Good    PT Frequency  2x / week    PT Duration  4 weeks    PT Treatment/Interventions  ADLs/Self Care Home Management;Manual techniques;Manual lymph drainage;Compression bandaging;Taping;Vasopneumatic Device;Patient/family education;Therapeutic exercise    PT Next Visit Plan  Cont with complete decongestive therapy and issue handout for self MLD(and review and have her return dmeo) for pt to do when her bandages are off. Instruct in lymphedema risk reductions..consider flexitouch and send demographics if teammates agree    PT Home Exercise Plan  compression bandaging    Consulted and Agree with Plan of Care  Patient       Patient will benefit from skilled therapeutic intervention in order to improve the following deficits and impairments:  Postural dysfunction, Increased edema, Decreased knowledge of precautions  Visit Diagnosis: Postmastectomy  lymphedema  Abnormal posture     Problem List Patient Active Problem List   Diagnosis Date Noted  . Goals of care, counseling/discussion 12/27/2018  . Osteonecrosis ([place]) 11/01/2018  . Pathological fracture in neoplastic disease, left femur, initial encounter for fracture ([place]) 01/28/2018  . Morbid obesity with BMI of 40.0-44.9, adult ([place]) 10/12/2017  . IUD complication ([place]) 123XX123  . Bone metastases ([place]) 08/13/2017  . Pain from bone metastases ([place]) 08/13/2017  . Liver metastases ([place]) 08/13/2017  . Steatosis of liver 08/13/2017  . Bilateral breast cancer ([place]) 12/26/2015  . Malignant neoplasm of central portion of left breast in female, estrogen receptor positive ([place]) 11/28/2015  . Malignant neoplasm of upper-inner quadrant of right breast in female, estrogen receptor positive ([place]) 10/17/2015  . Hypertension 04/19/2012  . Hypothyroid 04/19/2012  . Fibroids 04/19/2012  . Stress incontinence, female 04/19/2012    Otelia Limes, PTA 01/05/2019, 10:02 AM  [place], [place], [address] Phone: [phone]   Fax:  [phone]  Name: KADASHIA KRIEGER MRN: [id number] Date of Birth: 1966-04-04

## 2019-01-06 ENCOUNTER — Other Ambulatory Visit: Payer: Self-pay | Admitting: Oncology

## 2019-01-06 DIAGNOSIS — M278 Other specified diseases of jaws: Secondary | ICD-10-CM | POA: Diagnosis not present

## 2019-01-06 MED FILL — CLINDAMYCIN HCL 150 MG CAPS: 150 | 7 days supply | Qty: 28 | Fill #0

## 2019-01-06 MED FILL — HYDROCODON-APAP 5-325: 5-325 | 2 days supply | Qty: 14 | Fill #0

## 2019-01-09 DIAGNOSIS — I890 Lymphedema, not elsewhere classified: Secondary | ICD-10-CM | POA: Diagnosis not present

## 2019-01-10 ENCOUNTER — Encounter: Payer: Self-pay | Admitting: Physical Therapy

## 2019-01-10 ENCOUNTER — Ambulatory Visit: Payer: 59 | Admitting: Physical Therapy

## 2019-01-10 ENCOUNTER — Other Ambulatory Visit: Payer: Self-pay

## 2019-01-10 ENCOUNTER — Telehealth: Payer: Self-pay

## 2019-01-10 DIAGNOSIS — C50211 Malignant neoplasm of upper-inner quadrant of right female breast: Secondary | ICD-10-CM | POA: Diagnosis not present

## 2019-01-10 DIAGNOSIS — I972 Postmastectomy lymphedema syndrome: Secondary | ICD-10-CM | POA: Insufficient documentation

## 2019-01-10 DIAGNOSIS — C7951 Secondary malignant neoplasm of bone: Secondary | ICD-10-CM | POA: Diagnosis not present

## 2019-01-10 DIAGNOSIS — Z79811 Long term (current) use of aromatase inhibitors: Secondary | ICD-10-CM | POA: Diagnosis not present

## 2019-01-10 DIAGNOSIS — Z9013 Acquired absence of bilateral breasts and nipples: Secondary | ICD-10-CM | POA: Diagnosis not present

## 2019-01-10 DIAGNOSIS — Z170 Estrogen receptor positive status [ER+]: Secondary | ICD-10-CM | POA: Diagnosis not present

## 2019-01-10 DIAGNOSIS — Z23 Encounter for immunization: Secondary | ICD-10-CM | POA: Diagnosis not present

## 2019-01-10 DIAGNOSIS — R293 Abnormal posture: Secondary | ICD-10-CM | POA: Insufficient documentation

## 2019-01-10 MED FILL — TRIAMTERENE/HCTZ 37.5/25 CP: 37.5-25 | 90 days supply | Qty: 90 | Fill #3

## 2019-01-10 MED FILL — PREGABALIN 100 MG CAPS: 100 | 30 days supply | Qty: 90 | Fill #0

## 2019-01-10 NOTE — Telephone Encounter (Signed)
Oral Oncology Patient Advocate Encounter  Prior Authorization for [employer] has been approved.    PA# [id number] Effective dates: 01/10/19 through 01/09/20  Oral Oncology Clinic will continue to follow.   [place] Patient [place] Phone [phone] Fax [phone] 01/10/2019    3:11 PM

## 2019-01-10 NOTE — Therapy (Signed)
[place], [place], [address] Phone: [phone]   Fax:  [phone]  Physical Therapy Treatment  Patient Details  Name: Debbie [name] MRN: [id number] Date of Birth: 1966/04/30 Referring Provider (PT): Dr. Marlou Starks   Encounter Date: 01/10/2019  PT End of Session - 01/10/19 1128    Visit Number  9    Number of Visits  9    Date for PT Re-Evaluation  01/19/19    PT Start Time  1030    PT Stop Time  1122    PT Time Calculation (min)  52 min    Activity Tolerance  Patient tolerated treatment well    Behavior During Therapy  [facility] for tasks assessed/performed       Past Medical History:  Diagnosis Date  . Anxiety   . Arthritis   . Bone metastasis ([place])   . Breast cancer of upper-inner quadrant of right female breast [facility]) oncologist-  dr [name]--- 04/ 2019 ER/PR+ Stage IV w/ metastatic disease to liver, total spine, lung, bone   dx 06/ 2017  right breast invasive ductal carcinoma, Stage IIB, ER/PR + (pT2 pN1), grade 1-- s/p  right breast lumpecotmy w/ sln bx's and bilateral breast reduction 11-28-2015/  left breast with reduction , dx invasive ductal ca, Grade 1 (pT1cpNX) ER/PR positive/  adjuvant radiation completed 03-27-2016 to right chest wall, 2018  reclassifiec Stage IIA  . Cancer, metastatic to liver ([place])   . Chronic back pain   . Constipation   . Depression   . Family history of adverse reaction to anesthesia     mother has Copd was kept on vent   . Headache   . Heart murmur   . History of hyperthyroidism    s/p  RAI  . History of radiation therapy 02/11/16- 03/27/16   Right Chest Wall 50.4 Gy in 28 fractions, Right Chest Wall Boost 10 Gy in 5 fractions.   . Hyperlipidemia   . Hypertension   . Hypothyroidism, postradioiodine therapy    endocrinoloigst-  dr balan--  2004  s/p  RAI  . Menorrhagia 2011  . Multiple pulmonary nodules    secondary  metastatic  . PONV (postoperative nausea  and vomiting)    patch helps   . SUI (stress urinary incontinence, female)   . Wears contact lenses   . Wears dentures    full upper and lower partial    Past Surgical History:  Procedure Laterality Date  . BREAST LUMPECTOMY WITH NEEDLE LOCALIZATION AND AXILLARY SENTINEL LYMPH NODE BX Right 11/28/2015   Procedure: RIGHT BREAST LUMPECTOMY WITH NEEDLE LOCALIZATION AND AXILLARY SENTINEL LYMPH NODE BX;  Surgeon: Autumn Messing III, MD;  Location: [place];  Service: General;  Laterality: Right;  . BREAST REDUCTION SURGERY Bilateral 11/28/2015   Procedure: MAMMARY REDUCTION  (BREAST) BILATERAL;  Surgeon: Crissie Reese, MD;  Location: [place];  Service: Plastics;  Laterality: Bilateral;  . CESAREAN SECTION  01/12/1993  . COLONOSCOPY    . Dental surgeries    . FEMUR IM NAIL Left 01/28/2018   Procedure: INTRAMEDULLARY (IM) NAIL FEMORAL;  Surgeon: Nicholes Stairs, MD;  Location: [place];  Service: Orthopedics;  Laterality: Left;  . HYSTEROSCOPY N/A 09/02/2017   Procedure: HYSTEROSCOPY  to remove IUD;  Surgeon: Eldred Manges, MD;  Location: [facility];  Service: Gynecology;  Laterality: N/A;  . Liver biospy    . MASTECTOMY    . MASTECTOMY W/ SENTINEL NODE BIOPSY Bilateral 12/26/2015  Procedure: BILATERAL MASTECTOMY WITH LEFT SENTINEL LYMPH NODE BIOPSY;  Surgeon: Autumn Messing III, MD;  Location: [place];  Service: General;  Laterality: Bilateral;  . Removal of IUD    . TUBAL LIGATION Bilateral 1995    There were no vitals filed for this visit.  Subjective Assessment - 01/10/19 1036    Subjective  My hand is a little more swollen today because I have been working. I got a call that I am getting the [place].    Pertinent History  11/26/15- right lumpectomy, SLNB, and breast reduction and sent tissue from left breast for biopsy, 12/26/15- bilateral mastectomy and SLNB on L, pt completed radiation 08/12/17- pt diagnosed with stage 4 breast cancer in bone, liver and lung, pt is currently  undergoing chemotherapy, Oct 2019- pt had to undergo L medullary femur nailing due to impending pathologic fracture from bone mets- completed PT for 2 weeks, 2 weeks of radiation to femur, pt developed neuropathy of bilateral LEs in Dec 2019    Patient Stated Goals  to get swelling down in arm and get it controlled    Currently in Pain?  No/denies                  Outpatient Re[facility] from [facility]  Lymphedema Life Impact Scale Total Score  11.76 %           [facility] Adult PT Treatment/Exercise - 01/10/19 0001      Manual Therapy   Manual Lymphatic Drainage (MLD)  In supine: short neck, superficial and deep abdominals, right inguinal nodes and establishment of axillo inguinal pathway, R UE working proximal to distal then retracing all steps    Compression Bandaging  lotion, pt donned thick stockinette from hand to axilla then compression glove, artiflex from wrist to axilla, 1 6 cm bandage over glove at hand while wrapping more loosely around wrist since this is were glove has velcro, 1 - 8 cm bandage from wrist to axilla, 1 -10 cm bandage in herringbone pattern then another 10 cm bandage                  PT Long Term Goals - 12/08/18 1657      PT LONG TERM GOAL #1   Title  Pt will demonstrate maximal reduction of edema in RUE as evidenced by stabilization of circumferential measurements    Time  6    Period  Weeks    Status  New    Target Date  01/19/19      PT LONG TERM GOAL #2   Title  Pt will obtain appropriate compression garments for long term management of RUE lymphedema    Time  6    Period  Weeks    Status  New    Target Date  01/19/19      PT LONG TERM GOAL #3   Title  Pt will be independent in self MLD for long term management of lymphedema    Time  4    Period  Weeks    Status  New    Target Date  01/19/19      PT LONG TERM GOAL #4   Title  Pt's husband will be able to independently don pt's  compression bandages to help pt reach maximal reduction    Time  6    Period  Weeks    Status  New    Target Date  01/19/19  PT LONG TERM GOAL #5   Title  Pt will be able to independently verbalize lymphedema risk reduction practices    Time  6    Period  Weeks    Status  New    Target Date  01/19/19            Plan - 01/10/19 1129    Clinical Impression Statement  Pt had some increase in swelling at her hand today per visual estimate and pt attributes that to working today. She was unable to get measured for garments on Monday because the fitter was in an accident and unable to make it. Emailed fitter to reschedule appointment. Continued with MLD and compression bandaging today and spent increased time with MLD at hand and fingers due to more fullness.    Stability/Clinical Decision Making  Evolving/Moderate complexity    Rehab Potential  Good    PT Frequency  2x / week    PT Duration  4 weeks    PT Treatment/Interventions  ADLs/Self Care Home Management;Manual techniques;Manual lymph drainage;Compression bandaging;Taping;Vasopneumatic Device;Patient/family education;Therapeutic exercise    PT Next Visit Plan  Cont with complete decongestive therapy and issue handout for self MLD(and review and have her return dmeo) for pt to do when her bandages are off. Instruct in lymphedema risk reductions..consider flexitouch and send demographics if teammates agree    PT Home Exercise Plan  compression bandaging    Consulted and Agree with Plan of Care  Patient       Patient will benefit from skilled therapeutic intervention in order to improve the following deficits and impairments:  Postural dysfunction, Increased edema, Decreased knowledge of precautions  Visit Diagnosis: Postmastectomy lymphedema     Problem List Patient Active Problem List   Diagnosis Date Noted  . Goals of care, counseling/discussion 12/27/2018  . Osteonecrosis ([place]) 11/01/2018  . Pathological fracture  in neoplastic disease, left femur, initial encounter for fracture ([place]) 01/28/2018  . Morbid obesity with BMI of 40.0-44.9, adult ([place]) 10/12/2017  . IUD complication ([place]) 123XX123  . Bone metastases ([place]) 08/13/2017  . Pain from bone metastases ([place]) 08/13/2017  . Liver metastases ([place]) 08/13/2017  . Steatosis of liver 08/13/2017  . Bilateral breast cancer ([place]) 12/26/2015  . Malignant neoplasm of central portion of left breast in female, estrogen receptor positive ([place]) 11/28/2015  . Malignant neoplasm of upper-inner quadrant of right breast in female, estrogen receptor positive ([place]) 10/17/2015  . Hypertension 04/19/2012  . Hypothyroid 04/19/2012  . Fibroids 04/19/2012  . Stress incontinence, female 04/19/2012    [place] 01/10/2019, 11:31 AM  [place], [place], [address] Phone: [phone]   Fax:  [phone]  Name: ANOUSH PLAGMAN MRN: [id number] Date of Birth: 04/21/1966  Manus Gunning, PT 01/10/19 11:31 AM

## 2019-01-10 NOTE — Telephone Encounter (Signed)
Oral Oncology Patient Advocate Encounter  Received notification from [place] that the existing prior authorization for [employer]in[employer] is due for renewal.  Renewal PA submitted on CoverMyMeds Key [id number] Status is pending  Oral Oncology Clinic will continue to follow.  [place] Patient [place] Phone [phone] Fax [phone] 01/10/2019    9:08 AM

## 2019-01-11 MED FILL — AFINITOR 10 MG TABLET: 10 | 28 days supply | Qty: 28 | Fill #0

## 2019-01-12 ENCOUNTER — Encounter: Payer: Self-pay | Admitting: Physical Therapy

## 2019-01-12 ENCOUNTER — Other Ambulatory Visit: Payer: Self-pay

## 2019-01-12 ENCOUNTER — Ambulatory Visit: Payer: 59 | Admitting: Physical Therapy

## 2019-01-12 DIAGNOSIS — Z79811 Long term (current) use of aromatase inhibitors: Secondary | ICD-10-CM | POA: Diagnosis not present

## 2019-01-12 DIAGNOSIS — Z9013 Acquired absence of bilateral breasts and nipples: Secondary | ICD-10-CM | POA: Diagnosis not present

## 2019-01-12 DIAGNOSIS — C50211 Malignant neoplasm of upper-inner quadrant of right female breast: Secondary | ICD-10-CM | POA: Diagnosis not present

## 2019-01-12 DIAGNOSIS — I972 Postmastectomy lymphedema syndrome: Secondary | ICD-10-CM

## 2019-01-12 DIAGNOSIS — Z23 Encounter for immunization: Secondary | ICD-10-CM | POA: Diagnosis not present

## 2019-01-12 DIAGNOSIS — C7951 Secondary malignant neoplasm of bone: Secondary | ICD-10-CM | POA: Diagnosis not present

## 2019-01-12 DIAGNOSIS — Z170 Estrogen receptor positive status [ER+]: Secondary | ICD-10-CM | POA: Diagnosis not present

## 2019-01-12 NOTE — Therapy (Signed)
[place], [place], [address] Phone: [phone]   Fax:  [phone]  Physical Therapy Treatment  Patient Details  Name: Debbie [name] MRN: [id number] Date of Birth: 07-08-1965 Referring Provider (PT): Dr. Marlou Starks   Encounter Date: 01/12/2019  PT End of Session - 01/12/19 1119    Visit Number  10    Number of Visits  17    Date for PT Re-Evaluation  01/19/19    PT Start Time  1032    PT Stop Time  1118    PT Time Calculation (min)  46 min    Activity Tolerance  Patient tolerated treatment well    Behavior During Therapy  [facility] for tasks assessed/performed       Past Medical History:  Diagnosis Date  . Anxiety   . Arthritis   . Bone metastasis ([place])   . Breast cancer of upper-inner quadrant of right female breast [facility]) oncologist-  dr [name]--- 04/ 2019 ER/PR+ Stage IV w/ metastatic disease to liver, total spine, lung, bone   dx 06/ 2017  right breast invasive ductal carcinoma, Stage IIB, ER/PR + (pT2 pN1), grade 1-- s/p  right breast lumpecotmy w/ sln bx's and bilateral breast reduction 11-28-2015/  left breast with reduction , dx invasive ductal ca, Grade 1 (pT1cpNX) ER/PR positive/  adjuvant radiation completed 03-27-2016 to right chest wall, 2018  reclassifiec Stage IIA  . Cancer, metastatic to liver ([place])   . Chronic back pain   . Constipation   . Depression   . Family history of adverse reaction to anesthesia     mother has Copd was kept on vent   . Headache   . Heart murmur   . History of hyperthyroidism    s/p  RAI  . History of radiation therapy 02/11/16- 03/27/16   Right Chest Wall 50.4 Gy in 28 fractions, Right Chest Wall Boost 10 Gy in 5 fractions.   . Hyperlipidemia   . Hypertension   . Hypothyroidism, postradioiodine therapy    endocrinoloigst-  dr balan--  2004  s/p  RAI  . Menorrhagia 2011  . Multiple pulmonary nodules    secondary  metastatic  . PONV (postoperative nausea  and vomiting)    patch helps   . SUI (stress urinary incontinence, female)   . Wears contact lenses   . Wears dentures    full upper and lower partial    Past Surgical History:  Procedure Laterality Date  . BREAST LUMPECTOMY WITH NEEDLE LOCALIZATION AND AXILLARY SENTINEL LYMPH NODE BX Right 11/28/2015   Procedure: RIGHT BREAST LUMPECTOMY WITH NEEDLE LOCALIZATION AND AXILLARY SENTINEL LYMPH NODE BX;  Surgeon: Autumn Messing III, MD;  Location: [place];  Service: General;  Laterality: Right;  . BREAST REDUCTION SURGERY Bilateral 11/28/2015   Procedure: MAMMARY REDUCTION  (BREAST) BILATERAL;  Surgeon: Crissie Reese, MD;  Location: [place];  Service: Plastics;  Laterality: Bilateral;  . CESAREAN SECTION  01/12/1993  . COLONOSCOPY    . Dental surgeries    . FEMUR IM NAIL Left 01/28/2018   Procedure: INTRAMEDULLARY (IM) NAIL FEMORAL;  Surgeon: Nicholes Stairs, MD;  Location: [place];  Service: Orthopedics;  Laterality: Left;  . HYSTEROSCOPY N/A 09/02/2017   Procedure: HYSTEROSCOPY  to remove IUD;  Surgeon: Eldred Manges, MD;  [facility];  Service: Gynecology;  Laterality: N/A;  . Liver biospy    . MASTECTOMY    . MASTECTOMY W/ SENTINEL NODE BIOPSY Bilateral 12/26/2015  Procedure: BILATERAL MASTECTOMY WITH LEFT SENTINEL LYMPH NODE BIOPSY;  Surgeon: Autumn Messing III, MD;  Location: [place];  Service: General;  Laterality: Bilateral;  . Removal of IUD    . TUBAL LIGATION Bilateral 1995    There were no vitals filed for this visit.  Subjective Assessment - 01/12/19 1033    Subjective  My arm is a little tender because I have been wrapping it more tightly since it get loose throughout the day.    Pertinent History  11/26/15- right lumpectomy, SLNB, and breast reduction and sent tissue from left breast for biopsy, 12/26/15- bilateral mastectomy and SLNB on L, pt completed radiation 08/12/17- pt diagnosed with stage 4 breast cancer in bone, liver and lung, pt is currently undergoing  chemotherapy, Oct 2019- pt had to undergo L medullary femur nailing due to impending pathologic fracture from bone mets- completed PT for 2 weeks, 2 weeks of radiation to femur, pt developed neuropathy of bilateral LEs in Dec 2019    Patient Stated Goals  to get swelling down in arm and get it controlled    Currently in Pain?  No/denies            LYMPHEDEMA/ONCOLOGY QUESTIONNAIRE - 01/12/19 1033      Right Upper Extremity Lymphedema   15 cm Proximal to Olecranon Process  38.5 cm    Olecranon Process  33.3 cm    15 cm Proximal to Ulnar Styloid Process  31.5 cm    Just Proximal to Ulnar Styloid Process  22.2 cm    Across Hand at [employer]  22.5 cm    At [place] of 2nd Digit  7.6 cm           Outpatient Re[facility] from [facility]08/[facility]  Lymphedema Life Impact Scale Total Score  11.76 %           [facility] Adult PT Treatment/Exercise - 01/12/19 0001      Manual Therapy   Manual Lymphatic Drainage (MLD)  In supine: short neck, superficial and deep abdominals, right inguinal nodes and establishment of axillo inguinal pathway, R UE working proximal to distal then retracing all steps    Compression Bandaging  lotion, pt donned thick stockinette from hand to axilla then compression glove, artiflex from wrist to axilla, 1 6 cm bandage over glove at hand while wrapping more loosely around wrist since this is were glove has velcro, 1 - 8 cm bandage from wrist to axilla, 1 -10 cm bandage in herringbone pattern then another 10 cm bandage                  PT Long Term Goals - 12/08/18 1657      PT LONG TERM GOAL #1   Title  Pt will demonstrate maximal reduction of edema in RUE as evidenced by stabilization of circumferential measurements    Time  6    Period  Weeks    Status  New    Target Date  01/19/19      PT LONG TERM GOAL #2   Title  Pt will obtain appropriate compression garments for long term management of RUE lymphedema     Time  6    Period  Weeks    Status  New    Target Date  01/19/19      PT LONG TERM GOAL #3   Title  Pt will be independent in self MLD for long term management of lymphedema  Time  4    Period  Weeks    Status  New    Target Date  01/19/19      PT LONG TERM GOAL #4   Title  Pt's husband will be able to independently don pt's compression bandages to help pt reach maximal reduction    Time  6    Period  Weeks    Status  New    Target Date  01/19/19      PT LONG TERM GOAL #5   Title  Pt will be able to independently verbalize lymphedema risk reduction practices    Time  6    Period  Weeks    Status  New    Target Date  01/19/19            Plan - 01/12/19 1120    Clinical Impression Statement  Remeasured circumferences today and pt's measurements have increased since last time. Pt states she had to use her arm a lot this morning doing laundry etc and feels that is why it is more swollen. Rescheduled pt to get measured for garments on Sept 14th since this was the soonest the fitter could return to the clinic. WIll continue with MLD and bandaging until pt receives garments.    Rehab Potential  Good    PT Frequency  2x / week    PT Duration  4 weeks    PT Treatment/Interventions  ADLs/Self Care Home Management;Manual techniques;Manual lymph drainage;Compression bandaging;Taping;Vasopneumatic Device;Patient/family education;Therapeutic exercise    PT Next Visit Plan  Cont with complete decongestive therapy and issue handout for self MLD(and review and have her return dmeo) for pt to do when her bandages are off. Instruct in lymphedema risk reductions..consider flexitouch and send demographics if teammates agree    PT Home Exercise Plan  compression bandaging    Consulted and Agree with Plan of Care  Patient       Patient will benefit from skilled therapeutic intervention in order to improve the following deficits and impairments:  Postural dysfunction, Increased edema,  Decreased knowledge of precautions  Visit Diagnosis: Postmastectomy lymphedema     Problem List Patient Active Problem List   Diagnosis Date Noted  . Goals of care, counseling/discussion 12/27/2018  . Osteonecrosis ([place]) 11/01/2018  . Pathological fracture in neoplastic disease, left femur, initial encounter for fracture ([place]) 01/28/2018  . Morbid obesity with BMI of 40.0-44.9, adult ([place]) 10/12/2017  . IUD complication ([place]) [id number]  . Bone metastases ([place]) 08/13/2017  . Pain from bone metastases ([place]) 08/13/2017  . Liver metastases ([place]) 08/13/2017  . Steatosis of liver 08/13/2017  . Bilateral breast cancer ([place]) 12/26/2015  . Malignant neoplasm of central portion of left breast in female, estrogen receptor positive ([place]) 11/28/2015  . Malignant neoplasm of upper-inner quadrant of right breast in female, estrogen receptor positive ([place]) 10/17/2015  . Hypertension 04/19/2012  . Hypothyroid 04/19/2012  . Fibroids 04/19/2012  . Stress incontinence, female 04/19/2012    Allyson Sabal [facility] 01/12/2019, 11:24 AM  [place], [place], [address] Phone: [phone]   Fax:  [phone]  Name: Debbie [name] MRN: [id number] Date of Birth: 1965/06/28  Manus Gunning, PT 01/12/19 11:24 AM

## 2019-01-17 ENCOUNTER — Ambulatory Visit: Payer: 59

## 2019-01-17 ENCOUNTER — Other Ambulatory Visit: Payer: Self-pay

## 2019-01-17 DIAGNOSIS — C50211 Malignant neoplasm of upper-inner quadrant of right female breast: Secondary | ICD-10-CM | POA: Diagnosis not present

## 2019-01-17 DIAGNOSIS — Z23 Encounter for immunization: Secondary | ICD-10-CM | POA: Diagnosis not present

## 2019-01-17 DIAGNOSIS — Z9013 Acquired absence of bilateral breasts and nipples: Secondary | ICD-10-CM | POA: Diagnosis not present

## 2019-01-17 DIAGNOSIS — Z79811 Long term (current) use of aromatase inhibitors: Secondary | ICD-10-CM | POA: Diagnosis not present

## 2019-01-17 DIAGNOSIS — R293 Abnormal posture: Secondary | ICD-10-CM

## 2019-01-17 DIAGNOSIS — Z170 Estrogen receptor positive status [ER+]: Secondary | ICD-10-CM | POA: Diagnosis not present

## 2019-01-17 DIAGNOSIS — I972 Postmastectomy lymphedema syndrome: Secondary | ICD-10-CM

## 2019-01-17 DIAGNOSIS — C7951 Secondary malignant neoplasm of bone: Secondary | ICD-10-CM | POA: Diagnosis not present

## 2019-01-17 NOTE — Therapy (Signed)
[place], [place], [address] Phone: [phone]   Fax:  [phone]  Physical Therapy Treatment  Patient Details  Name: Debbie [name] MRN: [id number] Date of Birth: 02-Dec-1965 Referring Provider (PT): Dr. Marlou Starks   Encounter Date: 01/17/2019  PT End of Session - 01/17/19 0951    Visit Number  11    Number of Visits  17    Date for PT Re-Evaluation  01/19/19    PT Start Time  0901    PT Stop Time  0947    PT Time Calculation (min)  46 min    Activity Tolerance  Patient tolerated treatment well    Behavior During Therapy  [facility] for tasks assessed/performed       Past Medical History:  Diagnosis Date  . Anxiety   . Arthritis   . Bone metastasis ([place])   . Breast cancer of upper-inner quadrant of right female breast [facility]) oncologist-  dr [name]--- 04/ 2019 ER/PR+ Stage IV w/ metastatic disease to liver, total spine, lung, bone   dx 06/ 2017  right breast invasive ductal carcinoma, Stage IIB, ER/PR + (pT2 pN1), grade 1-- s/p  right breast lumpecotmy w/ sln bx's and bilateral breast reduction 11-28-2015/  left breast with reduction , dx invasive ductal ca, Grade 1 (pT1cpNX) ER/PR positive/  adjuvant radiation completed 03-27-2016 to right chest wall, 2018  reclassifiec Stage IIA  . Cancer, metastatic to liver ([place])   . Chronic back pain   . Constipation   . Depression   . Family history of adverse reaction to anesthesia     mother has Copd was kept on vent   . Headache   . Heart murmur   . History of hyperthyroidism    s/p  RAI  . History of radiation therapy 02/11/16- 03/27/16   Right Chest Wall 50.4 Gy in 28 fractions, Right Chest Wall Boost 10 Gy in 5 fractions.   . Hyperlipidemia   . Hypertension   . Hypothyroidism, postradioiodine therapy    endocrinoloigst-  dr balan--  2004  s/p  RAI  . Menorrhagia 2011  . Multiple pulmonary nodules    secondary  metastatic  . PONV (postoperative nausea  and vomiting)    patch helps   . SUI (stress urinary incontinence, female)   . Wears contact lenses   . Wears dentures    full upper and lower partial    Past Surgical History:  Procedure Laterality Date  . BREAST LUMPECTOMY WITH NEEDLE LOCALIZATION AND AXILLARY SENTINEL LYMPH NODE BX Right 11/28/2015   Procedure: RIGHT BREAST LUMPECTOMY WITH NEEDLE LOCALIZATION AND AXILLARY SENTINEL LYMPH NODE BX;  Surgeon: Autumn Messing III, MD;  Location: [place];  Service: General;  Laterality: Right;  . BREAST REDUCTION SURGERY Bilateral 11/28/2015   Procedure: MAMMARY REDUCTION  (BREAST) BILATERAL;  Surgeon: Crissie Reese, MD;  Location: [place];  Service: Plastics;  Laterality: Bilateral;  . CESAREAN SECTION  01/12/1993  . COLONOSCOPY    . Dental surgeries    . FEMUR IM NAIL Left 01/28/2018   Procedure: INTRAMEDULLARY (IM) NAIL FEMORAL;  Surgeon: Nicholes Stairs, MD;  Location: [place];  Service: Orthopedics;  Laterality: Left;  . HYSTEROSCOPY N/A 09/02/2017   Procedure: HYSTEROSCOPY  to remove IUD;  Surgeon: Eldred Manges, MD;  Location[facility];  Service: Gynecology;  Laterality: N/A;  . Liver biospy    . MASTECTOMY    . MASTECTOMY W/ SENTINEL NODE BIOPSY Bilateral 12/26/2015  Procedure: BILATERAL MASTECTOMY WITH LEFT SENTINEL LYMPH NODE BIOPSY;  Surgeon: Autumn Messing III, MD;  Location: [place];  Service: General;  Laterality: Bilateral;  . Removal of IUD    . TUBAL LIGATION Bilateral 1995    There were no vitals filed for this visit.  Subjective Assessment - 01/17/19 0903    Subjective  Still on the antibiotics from my oral surgery but it seems to be healing well. My Rt UE is doing okay. It has it's ups and downs. I do think it's as small as it's going to get.    Pertinent History  11/26/15- right lumpectomy, SLNB, and breast reduction and sent tissue from left breast for biopsy, 12/26/15- bilateral mastectomy and SLNB on L, pt completed radiation 08/12/17- pt diagnosed with stage 4  breast cancer in bone, liver and lung, pt is currently undergoing chemotherapy, Oct 2019- pt had to undergo L medullary femur nailing due to impending pathologic fracture from bone mets- completed PT for 2 weeks, 2 weeks of radiation to femur, pt developed neuropathy of bilateral LEs in Dec 2019    Patient Stated Goals  to get swelling down in arm and get it controlled    Currently in Pain?  No/denies                  Outpatient Re[facility]  Lymphedema Life Impact Scale Total Score  11.76 %           [facility] Adult PT Treatment/Exercise - 01/17/19 0001      Manual Therapy   Manual Lymphatic Drainage (MLD)  In supine: short neck, superficial and deep abdominals, right inguinal nodes and establishment of axillo inguinal pathway, R UE working proximal to distal then retracing all steps    Compression Bandaging  lotion, thick stockinette from hand to axilla then elastomull to fingers 1-4, artiflex from wrist to axilla, 1 6 cm, 1 - 8 cm bandage from wrist to axilla, 1 -10 cm bandage in herringbone pattern then 12 cm bandage                  PT Long Term Goals - 12/08/18 1657      PT LONG TERM GOAL #1   Title  Pt will demonstrate maximal reduction of edema in RUE as evidenced by stabilization of circumferential measurements    Time  6    Period  Weeks    Status  New    Target Date  01/19/19      PT LONG TERM GOAL #2   Title  Pt will obtain appropriate compression garments for long term management of RUE lymphedema    Time  6    Period  Weeks    Status  New    Target Date  01/19/19      PT LONG TERM GOAL #3   Title  Pt will be independent in self MLD for long term management of lymphedema    Time  4    Period  Weeks    Status  New    Target Date  01/19/19      PT LONG TERM GOAL #4   Title  Pt's husband will be able to independently don pt's compression bandages to help pt reach maximal reduction     Time  6    Period  Weeks    Status  New    Target Date  01/19/19      PT LONG TERM GOAL #  5   Title  Pt will be able to independently verbalize lymphedema risk reduction practices    Time  6    Period  Weeks    Status  New    Target Date  01/19/19            Plan - 01/17/19 [id number]    Clinical Impression Statement  Continued with complete decongestive therapy. Pt reports noticing increased swelling at her upper most arm above the bandage by the end of the day when she gets home from work. But she is able to decrease this with MLD and then use of the compression pump. Pt is to be measured for new compression sleeve and glove next week.    Personal Factors and Comorbidities  Comorbidity 1;Comorbidity 2    Comorbidities  metastatic disease, hx of radiation, neuropathy    Examination-Participation Restrictions  Other   grasping with fingers due to swelling   Stability/Clinical Decision Making  Evolving/Moderate complexity    Rehab Potential  Good    PT Frequency  2x / week    PT Duration  4 weeks    PT Treatment/Interventions  ADLs/Self Care Home Management;Manual techniques;Manual lymph drainage;Compression bandaging;Taping;Vasopneumatic Device;Patient/family education;Therapeutic exercise    PT Next Visit Plan  Cont with complete decongestive therapy. Instruct in lymphedema risk reductions.    PT Home Exercise Plan  compression bandaging, self MLD and daily use of Flexitouch    Consulted and Agree with Plan of Care  Patient       Patient will benefit from skilled therapeutic intervention in order to improve the following deficits and impairments:  Postural dysfunction, Increased edema, Decreased knowledge of precautions  Visit Diagnosis: Postmastectomy lymphedema  Abnormal posture     Problem List Patient Active Problem List   Diagnosis Date Noted  . Goals of care, counseling/discussion 12/27/2018  . Osteonecrosis ([place]) 11/01/2018  . Pathological fracture in neoplastic  disease, left femur, initial encounter for fracture ([place]) 01/28/2018  . Morbid obesity with BMI of 40.0-44.9, adult ([place]) 10/12/2017  . IUD complication ([place]) [id number]  . Bone metastases ([place]) 08/13/2017  . Pain from bone metastases ([place]) 08/13/2017  . Liver metastases ([place]) 08/13/2017  . Steatosis of liver 08/13/2017  . Bilateral breast cancer ([place]) 12/26/2015  . Malignant neoplasm of central portion of left breast in female, estrogen receptor positive ([place]) 11/28/2015  . Malignant neoplasm of upper-inner quadrant of right breast in female, estrogen receptor positive ([place]) 10/17/2015  . Hypertension 04/19/2012  . Hypothyroid 04/19/2012  . Fibroids 04/19/2012  . Stress incontinence, female 04/19/2012    Otelia Limes, PTA 01/17/2019, 9:56 AM  [place], [place], [address] Phone: [phone]   Fax:  [phone]  Name: Debbie [name] MRN: [id number] Date of Birth: 1965-07-19

## 2019-01-18 MED FILL — CLINDAMYCIN HCL 150 MG CAPS: 150 | 7 days supply | Qty: 28 | Fill #0

## 2019-01-19 ENCOUNTER — Other Ambulatory Visit: Payer: Self-pay

## 2019-01-19 ENCOUNTER — Ambulatory Visit: Payer: 59

## 2019-01-19 DIAGNOSIS — Z9013 Acquired absence of bilateral breasts and nipples: Secondary | ICD-10-CM | POA: Diagnosis not present

## 2019-01-19 DIAGNOSIS — I972 Postmastectomy lymphedema syndrome: Secondary | ICD-10-CM | POA: Diagnosis not present

## 2019-01-19 DIAGNOSIS — Z170 Estrogen receptor positive status [ER+]: Secondary | ICD-10-CM | POA: Diagnosis not present

## 2019-01-19 DIAGNOSIS — Z79811 Long term (current) use of aromatase inhibitors: Secondary | ICD-10-CM | POA: Diagnosis not present

## 2019-01-19 DIAGNOSIS — Z23 Encounter for immunization: Secondary | ICD-10-CM | POA: Diagnosis not present

## 2019-01-19 DIAGNOSIS — C50211 Malignant neoplasm of upper-inner quadrant of right female breast: Secondary | ICD-10-CM | POA: Diagnosis not present

## 2019-01-19 DIAGNOSIS — R293 Abnormal posture: Secondary | ICD-10-CM

## 2019-01-19 DIAGNOSIS — C7951 Secondary malignant neoplasm of bone: Secondary | ICD-10-CM | POA: Diagnosis not present

## 2019-01-19 NOTE — Therapy (Signed)
[place], [place], [address] Phone: [phone]   Fax:  [phone]  Physical Therapy Treatment  Patient Details  Name: Debbie [name] MRN: [id number] Date of Birth: May 13, 1965 Referring Provider (PT): Dr. Marlou Starks   Encounter Date: 01/19/2019  PT End of Session - 01/19/19 1001    Visit Number  12    Number of Visits  17    Date for PT Re-Evaluation  01/19/19    PT Start Time  0858    PT Stop Time  0949    PT Time Calculation (min)  51 min    Activity Tolerance  Patient tolerated treatment well    Behavior During Therapy  [facility] for tasks assessed/performed       Past Medical History:  Diagnosis Date  . Anxiety   . Arthritis   . Bone metastasis ([place])   . Breast cancer of upper-inner quadrant of right female breast [facility]) oncologist-  dr [name]--- 04/ 2019 ER/PR+ Stage IV w/ metastatic disease to liver, total spine, lung, bone   dx 06/ 2017  right breast invasive ductal carcinoma, Stage IIB, ER/PR + (pT2 pN1), grade 1-- s/p  right breast lumpecotmy w/ sln bx's and bilateral breast reduction 11-28-2015/  left breast with reduction , dx invasive ductal ca, Grade 1 (pT1cpNX) ER/PR positive/  adjuvant radiation completed 03-27-2016 to right chest wall, 2018  reclassifiec Stage IIA  . Cancer, metastatic to liver ([place])   . Chronic back pain   . Constipation   . Depression   . Family history of adverse reaction to anesthesia     mother has Copd was kept on vent   . Headache   . Heart murmur   . History of hyperthyroidism    s/p  RAI  . History of radiation therapy 02/11/16- 03/27/16   Right Chest Wall 50.4 Gy in 28 fractions, Right Chest Wall Boost 10 Gy in 5 fractions.   . Hyperlipidemia   . Hypertension   . Hypothyroidism, postradioiodine therapy    endocrinoloigst-  dr balan--  2004  s/p  RAI  . Menorrhagia 2011  . Multiple pulmonary nodules    secondary  metastatic  . PONV (postoperative  nausea and vomiting)    patch helps   . SUI (stress urinary incontinence, female)   . Wears contact lenses   . Wears dentures    full upper and lower partial    Past Surgical History:  Procedure Laterality Date  . BREAST LUMPECTOMY WITH NEEDLE LOCALIZATION AND AXILLARY SENTINEL LYMPH NODE BX Right 11/28/2015   Procedure: RIGHT BREAST LUMPECTOMY WITH NEEDLE LOCALIZATION AND AXILLARY SENTINEL LYMPH NODE BX;  Surgeon: Autumn Messing III, MD;  Location: [place];  Service: General;  Laterality: Right;  . BREAST REDUCTION SURGERY Bilateral 11/28/2015   Procedure: MAMMARY REDUCTION  (BREAST) BILATERAL;  Surgeon: Crissie Reese, MD;  Location: [place];  Service: Plastics;  Laterality: Bilateral;  . CESAREAN SECTION  01/12/1993  . COLONOSCOPY    . Dental surgeries    . FEMUR IM NAIL Left 01/28/2018   Procedure: INTRAMEDULLARY (IM) NAIL FEMORAL;  Surgeon: Nicholes Stairs, MD;  Location: [place];  Service: Orthopedics;  Laterality: Left;  . HYSTEROSCOPY N/A 09/02/2017   Procedure: HYSTEROSCOPY  to remove IUD;  Surgeon: Eldred Manges, MD;  Location: [facility];  Service: Gynecology;  Laterality: N/A;  . Liver biospy    . MASTECTOMY    . MASTECTOMY W/ SENTINEL NODE BIOPSY Bilateral 12/26/2015  Procedure: BILATERAL MASTECTOMY WITH LEFT SENTINEL LYMPH NODE BIOPSY;  Surgeon: Autumn Messing III, MD;  Location: [place];  Service: General;  Laterality: Bilateral;  . Removal of IUD    . TUBAL LIGATION Bilateral 1995    There were no vitals filed for this visit.  Subjective Assessment - 01/19/19 0901    Subjective  I am really sore today from the injection I have to get monthly for my bone cancer. I just get sore all over at times. My arm is doing pretty good. I think it's down as much as it will get.    Pertinent History  11/26/15- right lumpectomy, SLNB, and breast reduction and sent tissue from left breast for biopsy, 12/26/15- bilateral mastectomy and SLNB on L, pt completed radiation 08/12/17- pt  diagnosed with stage 4 breast cancer in bone, liver and lung, pt is currently undergoing chemotherapy, Oct 2019- pt had to undergo L medullary femur nailing due to impending pathologic fracture from bone mets- completed PT for 2 weeks, 2 weeks of radiation to femur, pt developed neuropathy of bilateral LEs in Dec 2019    Patient Stated Goals  to get swelling down in arm and get it controlled    Currently in Pain?  No/denies            LYMPHEDEMA/ONCOLOGY QUESTIONNAIRE - 01/19/19 0904      Right Upper Extremity Lymphedema   15 cm Proximal to Olecranon Process  39.7 cm    Olecranon Process  32.4 cm    15 cm Proximal to Ulnar Styloid Process  31.5 cm    Just Proximal to Ulnar Styloid Process  20.7 cm    Across Hand at [employer]  21.2 cm    At [place] of 2nd Digit  7.4 cm           Outpatient Rehab from [facility]  Lymphedema Life Impact Scale Total Score  11.76 %           [facility] Adult PT Treatment/Exercise - 01/19/19 0001      Manual Therapy   Manual Lymphatic Drainage (MLD)  In supine: short neck, superficial and deep abdominals, right inguinal nodes and establishment of axillo inguinal pathway, R UE working proximal to distal then retracing all steps    Compression Bandaging  lotion, thick stockinette from hand to axilla then elastomull to fingers 1-4 with 1/2" gray foam over dorsal hand, artiflex x1 from wrist to axilla, 1 6 cm to hand, 2 -10 cm bandage (first in herringbone pattern) then 12 cm bandage from wrist to axilla                  PT Long Term Goals - 12/08/18 1657      PT LONG TERM GOAL #1   Title  Pt will demonstrate maximal reduction of edema in RUE as evidenced by stabilization of circumferential measurements    Time  6    Period  Weeks    Status  New    Target Date  01/19/19      PT LONG TERM GOAL #2   Title  Pt will obtain appropriate compression garments for long term management of RUE  lymphedema    Time  6    Period  Weeks    Status  New    Target Date  01/19/19      PT LONG TERM GOAL #3   Title  Pt will be independent in self MLD for long  term management of lymphedema    Time  4    Period  Weeks    Status  New    Target Date  01/19/19      PT LONG TERM GOAL #4   Title  Pt's husband will be able to independently don pt's compression bandages to help pt reach maximal reduction    Time  6    Period  Weeks    Status  New    Target Date  01/19/19      PT LONG TERM GOAL #5   Title  Pt will be able to independently verbalize lymphedema risk reduction practices    Time  6    Period  Weeks    Status  New    Target Date  01/19/19            Plan - 01/19/19 1002    Clinical Impression Statement  Pt continues to tolerate bandaging well and is ready to be measured for compression garments oon Monday next week. Reconfirmed appt time of 1200 on 9/14 and pt verbalized understanding.    Personal Factors and Comorbidities  Comorbidity 1;Comorbidity 2    Comorbidities  metastatic disease, hx of radiation, neuropathy    Examination-Participation Restrictions  Other   grasping with fingers due to swelling   Stability/Clinical Decision Making  Evolving/Moderate complexity    Rehab Potential  Good    PT Frequency  2x / week    PT Duration  4 weeks    PT Treatment/Interventions  ADLs/Self Care Home Management;Manual techniques;Manual lymph drainage;Compression bandaging;Taping;Vasopneumatic Device;Patient/family education;Therapeutic exercise    PT Next Visit Plan  Cont with complete decongestive therapy. Instruct in lymphedema risk reductions.    PT Home Exercise Plan  compression bandaging, self MLD and daily use of Flexitouch    Consulted and Agree with Plan of Care  Patient       Patient will benefit from skilled therapeutic intervention in order to improve the following deficits and impairments:  Postural dysfunction, Increased edema, Decreased knowledge of  precautions  Visit Diagnosis: Postmastectomy lymphedema  Abnormal posture     Problem List Patient Active Problem List   Diagnosis Date Noted  . Goals of care, counseling/discussion 12/27/2018  . Osteonecrosis ([place]) 11/01/2018  . Pathological fracture in neoplastic disease, left femur, initial encounter for fracture ([place]) 01/28/2018  . Morbid obesity with BMI of 40.0-44.9, adult ([place]) 10/12/2017  . IUD complication ([place]) [id number]  . Bone metastases ([place]) 08/13/2017  . Pain from bone metastases ([place]) 08/13/2017  . Liver metastases ([place]) 08/13/2017  . Steatosis of liver 08/13/2017  . Bilateral breast cancer ([place]) 12/26/2015  . Malignant neoplasm of central portion of left breast in female, estrogen receptor positive ([place]) 11/28/2015  . Malignant neoplasm of upper-inner quadrant of right breast in female, estrogen receptor positive ([place]) 10/17/2015  . Hypertension 04/19/2012  . Hypothyroid 04/19/2012  . Fibroids 04/19/2012  . Stress incontinence, female 04/19/2012    Otelia Limes, PTA 01/19/2019, 10:12 AM  [place], [place], [address] Phone: [phone]   Fax:  [phone]  Name: KIIRA DOMINO MRN: [id number] Date of Birth: [birth year]

## 2019-01-24 ENCOUNTER — Inpatient Hospital Stay: Payer: 59

## 2019-01-24 ENCOUNTER — Inpatient Hospital Stay: Payer: 59 | Attending: Oncology

## 2019-01-24 ENCOUNTER — Encounter: Payer: Self-pay | Admitting: Physical Therapy

## 2019-01-24 ENCOUNTER — Ambulatory Visit: Payer: 59 | Admitting: Physical Therapy

## 2019-01-24 ENCOUNTER — Other Ambulatory Visit: Payer: Self-pay

## 2019-01-24 VITALS — BP 148/72 | HR 88 | Temp 98.2°F | Resp 18

## 2019-01-24 DIAGNOSIS — C50011 Malignant neoplasm of nipple and areola, right female breast: Secondary | ICD-10-CM

## 2019-01-24 DIAGNOSIS — C50211 Malignant neoplasm of upper-inner quadrant of right female breast: Secondary | ICD-10-CM | POA: Diagnosis not present

## 2019-01-24 DIAGNOSIS — Z23 Encounter for immunization: Secondary | ICD-10-CM | POA: Diagnosis not present

## 2019-01-24 DIAGNOSIS — Z170 Estrogen receptor positive status [ER+]: Secondary | ICD-10-CM | POA: Insufficient documentation

## 2019-01-24 DIAGNOSIS — I972 Postmastectomy lymphedema syndrome: Secondary | ICD-10-CM

## 2019-01-24 DIAGNOSIS — C7951 Secondary malignant neoplasm of bone: Secondary | ICD-10-CM | POA: Diagnosis not present

## 2019-01-24 DIAGNOSIS — Z79811 Long term (current) use of aromatase inhibitors: Secondary | ICD-10-CM | POA: Diagnosis not present

## 2019-01-24 DIAGNOSIS — Z9013 Acquired absence of bilateral breasts and nipples: Secondary | ICD-10-CM | POA: Insufficient documentation

## 2019-01-24 DIAGNOSIS — C50112 Malignant neoplasm of central portion of left female breast: Secondary | ICD-10-CM

## 2019-01-24 DIAGNOSIS — C50012 Malignant neoplasm of nipple and areola, left female breast: Secondary | ICD-10-CM

## 2019-01-24 LAB — COMPREHENSIVE METABOLIC PANEL
ALT: 27 U/L (ref 0–44)
AST: 24 U/L (ref 15–41)
Albumin: 3.2 g/dL — ABNORMAL LOW (ref 3.5–5.0)
Alkaline Phosphatase: 132 U/L — ABNORMAL HIGH (ref 38–126)
Anion gap: 11 (ref 5–15)
BUN: 14 mg/dL (ref 6–20)
CO2: 24 mmol/L (ref 22–32)
Calcium: 8.9 mg/dL (ref 8.9–10.3)
Chloride: 106 mmol/L (ref 98–111)
Creatinine, Ser: 0.96 mg/dL (ref 0.44–1.00)
GFR calc Af Amer: >60 mL/min (ref >60)
GFR calc non Af Amer: >60 mL/min (ref >60)
Glucose, Bld: 147 mg/dL — ABNORMAL HIGH (ref 70–99)
Potassium: 3.3 mmol/L — ABNORMAL LOW (ref 3.5–5.1)
Sodium: 141 mmol/L (ref 135–145)
Total Bilirubin: 0.3 mg/dL (ref 0.3–1.2)
Total Protein: 6.7 g/dL (ref 6.5–8.1)

## 2019-01-24 LAB — CBC WITH DIFFERENTIAL/PLATELET
Abs Immature Granulocytes: 0.02 10*3/uL (ref 0.00–0.07)
Basophils Absolute: 0 10*3/uL (ref 0.0–0.1)
Basophils Relative: 1 %
Eosinophils Absolute: 0.2 10*3/uL (ref 0.0–0.5)
Eosinophils Relative: 4 %
HCT: 34.2 % — ABNORMAL LOW (ref 36.0–46.0)
Hemoglobin: 11.2 g/dL — ABNORMAL LOW (ref 12.0–15.0)
Immature Granulocytes: 1 %
Lymphocytes Relative: 9 %
Lymphs Abs: 0.4 10*3/uL — ABNORMAL LOW (ref 0.7–4.0)
MCH: 28.2 pg (ref 26.0–34.0)
MCHC: 32.7 g/dL (ref 30.0–36.0)
MCV: 86.1 fL (ref 80.0–100.0)
Monocytes Absolute: 0.3 10*3/uL (ref 0.1–1.0)
Monocytes Relative: 8 %
Neutro Abs: 3.2 10*3/uL (ref 1.7–7.7)
Neutrophils Relative %: 77 %
Platelets: 286 10*3/uL (ref 150–400)
RBC: 3.97 MIL/uL (ref 3.87–5.11)
RDW: 14.7 % (ref 11.5–15.5)
WBC: 4.1 10*3/uL (ref 4.0–10.5)
nRBC: 0 % (ref 0.0–0.2)

## 2019-01-24 MED ORDER — INFLUENZA VAC SPLIT QUAD 0.5 ML IM SUSY
0.5000 mL | PREFILLED_SYRINGE | Freq: Once | INTRAMUSCULAR | Status: AC
  Administered 2019-01-24: 0.5 mL via INTRAMUSCULAR

## 2019-01-24 MED ORDER — INFLUENZA VAC SPLIT QUAD 0.5 ML IM SUSY
PREFILLED_SYRINGE | INTRAMUSCULAR | Status: AC
  Filled 2019-01-24: qty 0.5

## 2019-01-24 MED ORDER — DENOSUMAB 120 MG/1.7ML ~~LOC~~ SOLN
120.0000 mg | Freq: Once | SUBCUTANEOUS | Status: AC
  Administered 2019-01-24: 120 mg via SUBCUTANEOUS

## 2019-01-24 MED ORDER — DENOSUMAB 120 MG/1.7ML ~~LOC~~ SOLN
SUBCUTANEOUS | Status: AC
  Filled 2019-01-24: qty 1.7

## 2019-01-24 MED FILL — LEVOTHYROXINE 137 MCG TAB: 137 | 90 days supply | Qty: 90 | Fill #0

## 2019-01-24 NOTE — Therapy (Signed)
[place], [place], [address] Phone: [phone]   Fax:  [phone]  Physical Therapy Treatment  Patient Details  Name: Debbie [name] MRN: [id number] Date of Birth: [birth year] Referring Provider (PT): Dr. Marlou Starks   Encounter Date: 01/24/2019  PT End of Session - 01/24/19 1554    Visit Number  12    Number of Visits  17    Date for PT Re-Evaluation  01/19/19    PT Start Time  1502    PT Stop Time  1550    PT Time Calculation (min)  48 min    Activity Tolerance  Patient tolerated treatment well    Behavior During Therapy  [facility] for tasks assessed/performed       Past Medical History:  Diagnosis Date  . Anxiety   . Arthritis   . Bone metastasis ([place])   . Breast cancer of upper-inner quadrant of right female breast [facility]) oncologist-  dr [name]--- 04/ 2019 ER/PR+ Stage IV w/ metastatic disease to liver, total spine, lung, bone   dx 06/ 2017  right breast invasive ductal carcinoma, Stage IIB, ER/PR + (pT2 pN1), grade 1-- s/p  right breast lumpecotmy w/ sln bx's and bilateral breast reduction 11-28-2015/  left breast with reduction , dx invasive ductal ca, Grade 1 (pT1cpNX) ER/PR positive/  adjuvant radiation completed 03-27-2016 to right chest wall, 2018  reclassifiec Stage IIA  . Cancer, metastatic to liver ([place])   . Chronic back pain   . Constipation   . Depression   . Family history of adverse reaction to anesthesia     mother has Copd was kept on vent   . Headache   . Heart murmur   . History of hyperthyroidism    s/p  RAI  . History of radiation therapy 02/11/16- 03/27/16   Right Chest Wall 50.4 Gy in 28 fractions, Right Chest Wall Boost 10 Gy in 5 fractions.   . Hyperlipidemia   . Hypertension   . Hypothyroidism, postradioiodine therapy    endocrinoloigst-  dr balan--  2004  s/p  RAI  . Menorrhagia 2011  . Multiple pulmonary nodules    secondary  metastatic  . PONV (postoperative  nausea and vomiting)    patch helps   . SUI (stress urinary incontinence, female)   . Wears contact lenses   . Wears dentures    full upper and lower partial    Past Surgical History:  Procedure Laterality Date  . BREAST LUMPECTOMY WITH NEEDLE LOCALIZATION AND AXILLARY SENTINEL LYMPH NODE BX Right 11/28/2015   Procedure: RIGHT BREAST LUMPECTOMY WITH NEEDLE LOCALIZATION AND AXILLARY SENTINEL LYMPH NODE BX;  Surgeon: Autumn Messing III, MD;  Location: [place];  Service: General;  Laterality: Right;  . BREAST REDUCTION SURGERY Bilateral 11/28/2015   Procedure: MAMMARY REDUCTION  (BREAST) BILATERAL;  Surgeon: Crissie Reese, MD;  Location: [place];  Service: Plastics;  Laterality: Bilateral;  . CESAREAN SECTION  01/12/1993  . COLONOSCOPY    . Dental surgeries    . FEMUR IM NAIL Left 01/28/2018   Procedure: INTRAMEDULLARY (IM) NAIL FEMORAL;  Surgeon: Nicholes Stairs, MD;  Location: [place];  Service: Orthopedics;  Laterality: Left;  . HYSTEROSCOPY N/A 09/02/2017   Procedure: HYSTEROSCOPY  to remove IUD;  Surgeon: Eldred Manges, MD;  Location: [facility];  Service: Gynecology;  Laterality: N/A;  . Liver biospy    . MASTECTOMY    . MASTECTOMY W/ SENTINEL NODE BIOPSY Bilateral 12/26/2015  Procedure: BILATERAL MASTECTOMY WITH LEFT SENTINEL LYMPH NODE BIOPSY;  Surgeon: Autumn Messing III, MD;  Location: [place];  Service: General;  Laterality: Bilateral;  . Removal of IUD    . TUBAL LIGATION Bilateral 1995    There were no vitals filed for this visit.  Subjective Assessment - 01/24/19 1503    Subjective  I got measured for garments yesterday. My arm is feeling pretty good.    Pertinent History  11/26/15- right lumpectomy, SLNB, and breast reduction and sent tissue from left breast for biopsy, 12/26/15- bilateral mastectomy and SLNB on L, pt completed radiation 08/12/17- pt diagnosed with stage 4 breast cancer in bone, liver and lung, pt is currently undergoing chemotherapy, Oct 2019- pt had to  undergo L medullary femur nailing due to impending pathologic fracture from bone mets- completed PT for 2 weeks, 2 weeks of radiation to femur, pt developed neuropathy of bilateral LEs in Dec 2019    Patient Stated Goals  to get swelling down in arm and get it controlled    Currently in Pain?  No/denies                  Outpatient Re[facility]/08/[facility]  Lymphedema Life Impact Scale Total Score  11.76 %           [facility] Adult PT Treatment/Exercise - 01/24/19 0001      Manual Therapy   Manual Lymphatic Drainage (MLD)  In supine: short neck, superficial and deep abdominals, right inguinal nodes and establishment of axillo inguinal pathway, R UE working proximal to distal then retracing all steps  (Pended)     Compression Bandaging  lotion, thick stockinette from hand to axilla then elastomull to fingers 1-4 with 1/2" gray foam over dorsal hand, artiflex x1 from wrist to axilla, 1 6 cm to hand, 2 -10 cm bandage (second in herringbone pattern) then 12 cm bandage from wrist to axilla  (Pended)                   PT Long Term Goals - 12/08/18 1657      PT LONG TERM GOAL #1   Title  Pt will demonstrate maximal reduction of edema in RUE as evidenced by stabilization of circumferential measurements    Time  6    Period  Weeks    Status  New    Target Date  01/19/19      PT LONG TERM GOAL #2   Title  Pt will obtain appropriate compression garments for long term management of RUE lymphedema    Time  6    Period  Weeks    Status  New    Target Date  01/19/19      PT LONG TERM GOAL #3   Title  Pt will be independent in self MLD for long term management of lymphedema    Time  4    Period  Weeks    Status  New    Target Date  01/19/19      PT LONG TERM GOAL #4   Title  Pt's husband will be able to independently don pt's compression bandages to help pt reach maximal reduction    Time  6    Period  Weeks    Status  New     Target Date  01/19/19      PT LONG TERM GOAL #5   Title  Pt will be able to independently verbalize lymphedema risk  reduction practices    Time  6    Period  Weeks    Status  New    Target Date  01/19/19            Plan - 01/24/19 1554    Clinical Impression Statement  Pt was measured for compression garments yesterday. Continued with MLD and bandaging today and will continue to do so until her compression garments arrive. Bandaged her fingers today since she does not have to work today.    Stability/Clinical Decision Making  Evolving/Moderate complexity    Rehab Potential  Good    PT Frequency  2x / week    PT Duration  4 weeks    PT Treatment/Interventions  ADLs/Self Care Home Management;Manual techniques;Manual lymph drainage;Compression bandaging;Taping;Vasopneumatic Device;Patient/family education;Therapeutic exercise    PT Next Visit Plan  Cont with complete decongestive therapy. Instruct in lymphedema risk reductions.    PT Home Exercise Plan  compression bandaging, self MLD and daily use of Flexitouch    Consulted and Agree with Plan of Care  Patient       Patient will benefit from skilled therapeutic intervention in order to improve the following deficits and impairments:  Postural dysfunction, Increased edema, Decreased knowledge of precautions  Visit Diagnosis: Postmastectomy lymphedema     Problem List Patient Active Problem List   Diagnosis Date Noted  . Goals of care, counseling/discussion 12/27/2018  . Osteonecrosis ([place]) 11/01/2018  . Pathological fracture in neoplastic disease, left femur, initial encounter for fracture ([place]) 01/28/2018  . Morbid obesity with BMI of 40.0-44.9, adult ([place]) 10/12/2017  . IUD complication ([place]) [id number]  . Bone metastases ([place]) 08/13/2017  . Pain from bone metastases ([place]) 08/13/2017  . Liver metastases ([place]) 08/13/2017  . Steatosis of liver 08/13/2017  . Bilateral breast cancer ([place]) 12/26/2015  . Malignant  neoplasm of central portion of left breast in female, estrogen receptor positive ([place]) 11/28/2015  . Malignant neoplasm of upper-inner quadrant of right breast in female, estrogen receptor positive ([place]) 10/17/2015  . Hypertension 04/19/2012  . Hypothyroid 04/19/2012  . Fibroids 04/19/2012  . Stress incontinence, female 04/19/2012    Allyson Sabal [facility] 01/24/2019, 3:55 PM  [place], [place], [address] Phone: [phone]   Fax:  [phone]  Name: MYLIA EVERSMAN MRN: [id number] Date of Birth: 23-Jan-1966  Manus Gunning, PT 01/24/19 3:56 PM

## 2019-01-24 NOTE — Patient Instructions (Addendum)
Denosumab injection What is this medicine? DENOSUMAB (den oh sue mab) slows bone breakdown. Prolia is used to treat osteoporosis in women after menopause and in men, and in people who are taking corticosteroids for 6 months or more. Xgeva is used to treat a high calcium level due to cancer and to prevent bone fractures and other bone problems caused by multiple myeloma or cancer bone metastases. Xgeva is also used to treat giant cell tumor of the bone. This medicine may be used for other purposes; ask your health care provider or pharmacist if you have questions. COMMON BRAND NAME(S): Prolia, XGEVA What should I tell my health care provider before I take this medicine? They need to know if you have any of these conditions:  dental disease  having surgery or tooth extraction  infection  kidney disease  low levels of calcium or Vitamin D in the blood  malnutrition  on hemodialysis  skin conditions or sensitivity  thyroid or parathyroid disease  an unusual reaction to denosumab, other medicines, foods, dyes, or preservatives  pregnant or trying to get pregnant  breast-feeding How should I use this medicine? This medicine is for injection under the skin. It is given by a health care professional in a hospital or clinic setting. A special MedGuide will be given to you before each treatment. Be sure to read this information carefully each time. For Prolia, talk to your pediatrician regarding the use of this medicine in children. Special care may be needed. For Xgeva, talk to your pediatrician regarding the use of this medicine in children. While this drug may be prescribed for children as young as 13 years for selected conditions, precautions do apply. Overdosage: If you think you have taken too much of this medicine contact a poison control center or emergency room at once. NOTE: This medicine is only for you. Do not share this medicine with others. What if I miss a dose? It is  important not to miss your dose. Call your doctor or health care professional if you are unable to keep an appointment. What may interact with this medicine? Do not take this medicine with any of the following medications:  other medicines containing denosumab This medicine may also interact with the following medications:  medicines that lower your chance of fighting infection  steroid medicines like prednisone or cortisone This list may not describe all possible interactions. Give your health care provider a list of all the medicines, herbs, non-prescription drugs, or dietary supplements you use. Also tell them if you smoke, drink alcohol, or use illegal drugs. Some items may interact with your medicine. What should I watch for while using this medicine? Visit your doctor or health care professional for regular checks on your progress. Your doctor or health care professional may order blood tests and other tests to see how you are doing. Call your doctor or health care professional for advice if you get a fever, chills or sore throat, or other symptoms of a cold or flu. Do not treat yourself. This drug may decrease your body's ability to fight infection. Try to avoid being around people who are sick. You should make sure you get enough calcium and vitamin D while you are taking this medicine, unless your doctor tells you not to. Discuss the foods you eat and the vitamins you take with your health care professional. See your dentist regularly. Brush and floss your teeth as directed. Before you have any dental work done, tell your dentist you are   receiving this medicine. Do not become pregnant while taking this medicine or for 5 months after stopping it. Talk with your doctor or health care professional about your birth control options while taking this medicine. Women should inform their doctor if they wish to become pregnant or think they might be pregnant. There is a potential for serious side  effects to an unborn child. Talk to your health care professional or pharmacist for more information. What side effects may I notice from receiving this medicine? Side effects that you should report to your doctor or health care professional as soon as possible:  allergic reactions like skin rash, itching or hives, swelling of the face, lips, or tongue  bone pain  breathing problems  dizziness  jaw pain, especially after dental work  redness, blistering, peeling of the skin  signs and symptoms of infection like fever or chills; cough; sore throat; pain or trouble passing urine  signs of low calcium like fast heartbeat, muscle cramps or muscle pain; pain, tingling, numbness in the hands or feet; seizures  unusual bleeding or bruising  unusually weak or tired Side effects that usually do not require medical attention (report to your doctor or health care professional if they continue or are bothersome):  constipation  diarrhea  headache  joint pain  loss of appetite  muscle pain  runny nose  tiredness  upset stomach This list may not describe all possible side effects. Call your doctor for medical advice about side effects. You may report side effects to FDA at 1-800-FDA-1088. Where should I keep my medicine? This medicine is only given in a clinic, doctor's office, or other health care setting and will not be stored at home. NOTE: This sheet is a summary. It may not cover all possible information. If you have questions about this medicine, talk to your doctor, pharmacist, or health care provider.  2020 Elsevier/Gold Standard (2017-09-03 16:10:44) Influenza Virus Vaccine injection (Fluarix) What is this medicine? INFLUENZA VIRUS VACCINE (in floo EN zuh VAHY ruhs vak SEEN) helps to reduce the risk of getting influenza also known as the flu. This medicine may be used for other purposes; ask your health care provider or pharmacist if you have questions. COMMON BRAND  NAME(S): Fluarix, Fluzone What should I tell my health care provider before I take this medicine? They need to know if you have any of these conditions:  bleeding disorder like hemophilia  fever or infection  Guillain-Barre syndrome or other neurological problems  immune system problems  infection with the human immunodeficiency virus (HIV) or AIDS  low blood platelet counts  multiple sclerosis  an unusual or allergic reaction to influenza virus vaccine, eggs, chicken proteins, latex, gentamicin, other medicines, foods, dyes or preservatives  pregnant or trying to get pregnant  breast-feeding How should I use this medicine? This vaccine is for injection into a muscle. It is given by a health care professional. A copy of Vaccine Information Statements will be given before each vaccination. Read this sheet carefully each time. The sheet may change frequently. Talk to your pediatrician regarding the use of this medicine in children. Special care may be needed. Overdosage: If you think you have taken too much of this medicine contact a poison control center or emergency room at once. NOTE: This medicine is only for you. Do not share this medicine with others. What if I miss a dose? This does not apply. What may interact with this medicine?  chemotherapy or radiation therapy  medicines that  lower your immune system like etanercept, anakinra, infliximab, and adalimumab  medicines that treat or prevent blood clots like warfarin  phenytoin  steroid medicines like prednisone or cortisone  theophylline  vaccines This list may not describe all possible interactions. Give your health care provider a list of all the medicines, herbs, non-prescription drugs, or dietary supplements you use. Also tell them if you smoke, drink alcohol, or use illegal drugs. Some items may interact with your medicine. What should I watch for while using this medicine? Report any side effects that do not  go away within 3 days to your doctor or health care professional. Call your health care provider if any unusual symptoms occur within 6 weeks of receiving this vaccine. You may still catch the flu, but the illness is not usually as bad. You cannot get the flu from the vaccine. The vaccine will not protect against colds or other illnesses that may cause fever. The vaccine is needed every year. What side effects may I notice from receiving this medicine? Side effects that you should report to your doctor or health care professional as soon as possible:  allergic reactions like skin rash, itching or hives, swelling of the face, lips, or tongue Side effects that usually do not require medical attention (report to your doctor or health care professional if they continue or are bothersome):  fever  headache  muscle aches and pains  pain, tenderness, redness, or swelling at site where injected  weak or tired This list may not describe all possible side effects. Call your doctor for medical advice about side effects. You may report side effects to FDA at 1-800-FDA-1088. Where should I keep my medicine? This vaccine is only given in a clinic, pharmacy, doctor's office, or other health care setting and will not be stored at home. NOTE: This sheet is a summary. It may not cover all possible information. If you have questions about this medicine, talk to your doctor, pharmacist, or health care provider.  2020 Elsevier/Gold Standard (2007-11-23 09:30:40)

## 2019-01-25 LAB — CANCER ANTIGEN 27.29: CA 27.29: 31.5 U/mL (ref 0.0–38.6)

## 2019-01-26 ENCOUNTER — Other Ambulatory Visit: Payer: Self-pay

## 2019-01-26 ENCOUNTER — Encounter: Payer: Self-pay | Admitting: Physical Therapy

## 2019-01-26 ENCOUNTER — Ambulatory Visit: Payer: 59 | Admitting: Physical Therapy

## 2019-01-26 DIAGNOSIS — Z23 Encounter for immunization: Secondary | ICD-10-CM | POA: Diagnosis not present

## 2019-01-26 DIAGNOSIS — I972 Postmastectomy lymphedema syndrome: Secondary | ICD-10-CM | POA: Diagnosis not present

## 2019-01-26 DIAGNOSIS — C50211 Malignant neoplasm of upper-inner quadrant of right female breast: Secondary | ICD-10-CM | POA: Diagnosis not present

## 2019-01-26 DIAGNOSIS — Z79811 Long term (current) use of aromatase inhibitors: Secondary | ICD-10-CM | POA: Diagnosis not present

## 2019-01-26 DIAGNOSIS — C7951 Secondary malignant neoplasm of bone: Secondary | ICD-10-CM | POA: Diagnosis not present

## 2019-01-26 DIAGNOSIS — Z170 Estrogen receptor positive status [ER+]: Secondary | ICD-10-CM | POA: Diagnosis not present

## 2019-01-26 DIAGNOSIS — Z9013 Acquired absence of bilateral breasts and nipples: Secondary | ICD-10-CM | POA: Diagnosis not present

## 2019-01-26 DIAGNOSIS — R293 Abnormal posture: Secondary | ICD-10-CM

## 2019-01-26 NOTE — Patient Instructions (Signed)
[email or web address]

## 2019-01-26 NOTE — Therapy (Signed)
[place], [place], [address] Phone: [phone]   Fax:  [phone]  Physical Therapy Treatment  Patient Details  Name: Debbie [name] MRN: [id number] Date of Birth: 02/15/1966 Referring Provider (PT): Dr. Marlou Starks   Encounter Date: 01/26/2019  PT End of Session - 01/26/19 1325    Visit Number  13    Number of Visits  25    Date for PT Re-Evaluation  02/24/19    PT Start Time  1000    PT Stop Time  1055    PT Time Calculation (min)  55 min    Activity Tolerance  Patient tolerated treatment well       Past Medical History:  Diagnosis Date  . Anxiety   . Arthritis   . Bone metastasis ([place])   . Breast cancer of upper-inner quadrant of right female breast [facility]) oncologist-  dr [name]--- 04/ 2019 ER/PR+ Stage IV w/ metastatic disease to liver, total spine, lung, bone   dx 06/ 2017  right breast invasive ductal carcinoma, Stage IIB, ER/PR + (pT2 pN1), grade 1-- s/p  right breast lumpecotmy w/ sln bx's and bilateral breast reduction 11-28-2015/  left breast with reduction , dx invasive ductal ca, Grade 1 (pT1cpNX) ER/PR positive/  adjuvant radiation completed 03-27-2016 to right chest wall, 2018  reclassifiec Stage IIA  . Cancer, metastatic to liver ([place])   . Chronic back pain   . Constipation   . Depression   . Family history of adverse reaction to anesthesia     mother has Copd was kept on vent   . Headache   . Heart murmur   . History of hyperthyroidism    s/p  RAI  . History of radiation therapy 02/11/16- 03/27/16   Right Chest Wall 50.4 Gy in 28 fractions, Right Chest Wall Boost 10 Gy in 5 fractions.   . Hyperlipidemia   . Hypertension   . Hypothyroidism, postradioiodine therapy    endocrinoloigst-  dr balan--  2004  s/p  RAI  . Menorrhagia 2011  . Multiple pulmonary nodules    secondary  metastatic  . PONV (postoperative nausea and vomiting)    patch helps   . SUI (stress urinary  incontinence, female)   . Wears contact lenses   . Wears dentures    full upper and lower partial    Past Surgical History:  Procedure Laterality Date  . BREAST LUMPECTOMY WITH NEEDLE LOCALIZATION AND AXILLARY SENTINEL LYMPH NODE BX Right 11/28/2015   Procedure: RIGHT BREAST LUMPECTOMY WITH NEEDLE LOCALIZATION AND AXILLARY SENTINEL LYMPH NODE BX;  Surgeon: Autumn Messing III, MD;  Location: [place];  Service: General;  Laterality: Right;  . BREAST REDUCTION SURGERY Bilateral 11/28/2015   Procedure: MAMMARY REDUCTION  (BREAST) BILATERAL;  Surgeon: Crissie Reese, MD;  Location: [place];  Service: Plastics;  Laterality: Bilateral;  . CESAREAN SECTION  01/12/1993  . COLONOSCOPY    . Dental surgeries    . FEMUR IM NAIL Left 01/28/2018   Procedure: INTRAMEDULLARY (IM) NAIL FEMORAL;  Surgeon: Nicholes Stairs, MD;  Location: [place];  Service: Orthopedics;  Laterality: Left;  . HYSTEROSCOPY N/A 09/02/2017   Procedure: HYSTEROSCOPY  to remove IUD;  Surgeon: Eldred Manges, MD;  Location: [facility];  Service: Gynecology;  Laterality: N/A;  . Liver biospy    . MASTECTOMY    . MASTECTOMY W/ SENTINEL NODE BIOPSY Bilateral 12/26/2015   Procedure: BILATERAL MASTECTOMY WITH LEFT SENTINEL LYMPH NODE BIOPSY;  Surgeon: Autumn Messing III, MD;  Location: [place];  Service: General;  Laterality: Bilateral;  . Removal of IUD    . TUBAL LIGATION Bilateral 1995    There were no vitals filed for this visit.  Subjective Assessment - 01/26/19 1002    Subjective  Pt says she is using the Flexitouch on the days that she doesn't get treatment.  She was measured this week for her garments. She feels that she is able to bandage at home She gets benefit from treatment and wants to keep coming until she gets her garments and make sure they fit ok    Pertinent History  11/26/15- right lumpectomy, SLNB, and breast reduction and sent tissue from left breast for biopsy, 12/26/15- bilateral mastectomy and SLNB on L, pt  completed radiation 08/12/17- pt diagnosed with stage 4 breast cancer in bone, liver and lung, pt is currently undergoing chemotherapy, Oct 2019- pt had to undergo L medullary femur nailing due to impending pathologic fracture from bone mets- completed PT for 2 weeks, 2 weeks of radiation to femur, pt developed neuropathy of bilateral LEs in Dec 2019    Currently in Pain?  No/denies         [facility] PT Assessment - 01/26/19 0001      Assessment   Medical Diagnosis  left and right breast cancer    Referring Provider (PT)  Dr. Marlou Starks    Onset Date/Surgical Date  11/26/15      Prior Function   Level of Independence  Independent              [facility]patient [facility]  Lymphedema Life Impact Scale Total Score  11.76 %           [facility] Adult PT Treatment/Exercise - 01/26/19 0001      Manual Therapy   Manual Lymphatic Drainage (MLD)  In Supine: Short neck, Rt  right chest and lateral chest at axilla inguinal nodes, Rt axillo-inguinal anastomosis, then Rt UE working from lateral upper arm to dorsal hand proximally to distal then retracing all steps. then to left sidelying for axilla and lateral chest to back     Compression Bandaging   lotion, thick stockinette with extra piece at web space  thin foam to back of hand 2  Elastomull to fingers 2,3,4 fixated with paper tape  Artiflex x2  and 1-6, 2-10 and 1-12 cm short stretch compression bandages from hand to axilla  with herringbone with 10 cm at forearm                   PT Long Term Goals - 01/26/19 1322      PT LONG TERM GOAL #1   Title  Pt will demonstrate maximal reduction of edema in RUE as evidenced by stabilization of circumferential measurements    Time  4    Period  Weeks    Status  On-going      PT LONG TERM GOAL #2   Title  Pt will obtain appropriate compression garments for long term management of RUE lymphedema    Time  4    Period  Weeks    Status   On-going      PT LONG TERM GOAL #3   Title  Pt will be independent in self MLD for long term management of lymphedema    Time  4    Status  Achieved      PT LONG TERM GOAL #4  Title  Pt's husband will be able to independently don pt's compression bandages to help pt reach maximal reduction    Time  4    Period  Weeks    Status  Achieved      PT LONG TERM GOAL #5   Title  Pt will be able to independently verbalize lymphedema risk reduction practices    Time  4    Status  On-going            Plan - 01/26/19 1320    Clinical Impression Statement  Pt felt better at end of session and is tolerating the banagind well while she awaits the delivery of her compression garments.  She will benefit from continued PT until they arrive with extra focus on scar mobility to anterior chest  Renewal sent today    Personal Factors and Comorbidities  Comorbidity 1;Comorbidity 2    Comorbidities  metastatic disease, hx of radiation, neuropathy    Stability/Clinical Decision Making  Evolving/Moderate complexity    Clinical Decision Making  Moderate    Rehab Potential  Good    PT Frequency  2x / week    PT Duration  4 weeks    PT Treatment/Interventions  ADLs/Self Care Home Management;Manual techniques;Manual lymph drainage;Compression bandaging;Taping;Vasopneumatic Device;Patient/family education;Therapeutic exercise    PT Next Visit Plan  Cont with complete decongestive therapy. scar moblity to anterior chest    Consulted and Agree with Plan of Care  Patient       Patient will benefit from skilled therapeutic intervention in order to improve the following deficits and impairments:  Postural dysfunction, Increased edema, Decreased knowledge of precautions  Visit Diagnosis: Postmastectomy lymphedema - Plan: PT plan of care cert/re-cert  Abnormal posture - Plan: PT plan of care cert/re-cert     Problem List Patient Active Problem List   Diagnosis Date Noted  . Goals of care,  counseling/discussion 12/27/2018  . Osteonecrosis ([place]) 11/01/2018  . Pathological fracture in neoplastic disease, left femur, initial encounter for fracture ([place]) 01/28/2018  . Morbid obesity with BMI of 40.0-44.9, adult ([place]) 10/12/2017  . IUD complication ([place]) 123XX123  . Bone metastases ([place]) 08/13/2017  . Pain from bone metastases ([place]) 08/13/2017  . Liver metastases ([place]) 08/13/2017  . Steatosis of liver 08/13/2017  . Bilateral breast cancer ([place]) 12/26/2015  . Malignant neoplasm of central portion of left breast in female, estrogen receptor positive ([place]) 11/28/2015  . Malignant neoplasm of upper-inner quadrant of right breast in female, estrogen receptor positive ([place]) 10/17/2015  . Hypertension 04/19/2012  . Hypothyroid 04/19/2012  . Fibroids 04/19/2012  . Stress incontinence, female 04/19/2012   Donato Heinz. Owens Shark PT  Norwood Levo 01/26/2019, 1:25 PM  [place], [place], [address] Phone: [phone]   Fax:  [phone]  Name: Debbie [name] MRN: [id number] Date of Birth: 12-29-1965

## 2019-01-28 MED FILL — LOSARTAN POTASSIUM 100 MG T: 100 | 30 days supply | Qty: 30 | Fill #7

## 2019-01-31 ENCOUNTER — Ambulatory Visit: Payer: 59 | Admitting: Physical Therapy

## 2019-01-31 ENCOUNTER — Other Ambulatory Visit: Payer: Self-pay

## 2019-01-31 ENCOUNTER — Encounter: Payer: Self-pay | Admitting: Physical Therapy

## 2019-01-31 DIAGNOSIS — Z9013 Acquired absence of bilateral breasts and nipples: Secondary | ICD-10-CM | POA: Diagnosis not present

## 2019-01-31 DIAGNOSIS — C50211 Malignant neoplasm of upper-inner quadrant of right female breast: Secondary | ICD-10-CM | POA: Diagnosis not present

## 2019-01-31 DIAGNOSIS — Z79811 Long term (current) use of aromatase inhibitors: Secondary | ICD-10-CM | POA: Diagnosis not present

## 2019-01-31 DIAGNOSIS — C7951 Secondary malignant neoplasm of bone: Secondary | ICD-10-CM | POA: Diagnosis not present

## 2019-01-31 DIAGNOSIS — R293 Abnormal posture: Secondary | ICD-10-CM

## 2019-01-31 DIAGNOSIS — Z170 Estrogen receptor positive status [ER+]: Secondary | ICD-10-CM | POA: Diagnosis not present

## 2019-01-31 DIAGNOSIS — I972 Postmastectomy lymphedema syndrome: Secondary | ICD-10-CM | POA: Diagnosis not present

## 2019-01-31 DIAGNOSIS — Z23 Encounter for immunization: Secondary | ICD-10-CM | POA: Diagnosis not present

## 2019-01-31 NOTE — Therapy (Signed)
[place], [place], [address] Phone: [phone]   Fax:  [phone]  Physical Therapy Treatment  Patient Details  Name: Debbie [name] MRN: [id number] Date of Birth: 12/25/1965 Referring Provider (PT): Dr. Marlou Starks   Encounter Date: 01/31/2019  PT End of Session - 01/31/19 1122    Visit Number  14    Number of Visits  25    Date for PT Re-Evaluation  02/24/19    PT Start Time  1030    PT Stop Time  1118    PT Time Calculation (min)  48 min    Activity Tolerance  Patient tolerated treatment well    Behavior During Therapy  [facility] for tasks assessed/performed       Past Medical History:  Diagnosis Date  . Anxiety   . Arthritis   . Bone metastasis ([place])   . Breast cancer of upper-inner quadrant of right female breast [facility]) oncologist-  dr [name]--- 04/ 2019 ER/PR+ Stage IV w/ metastatic disease to liver, total spine, lung, bone   dx 06/ 2017  right breast invasive ductal carcinoma, Stage IIB, ER/PR + (pT2 pN1), grade 1-- s/p  right breast lumpecotmy w/ sln bx's and bilateral breast reduction 11-28-2015/  left breast with reduction , dx invasive ductal ca, Grade 1 (pT1cpNX) ER/PR positive/  adjuvant radiation completed 03-27-2016 to right chest wall, 2018  reclassifiec Stage IIA  . Cancer, metastatic to liver ([place])   . Chronic back pain   . Constipation   . Depression   . Family history of adverse reaction to anesthesia     mother has Copd was kept on vent   . Headache   . Heart murmur   . History of hyperthyroidism    s/p  RAI  . History of radiation therapy 02/11/16- 03/27/16   Right Chest Wall 50.4 Gy in 28 fractions, Right Chest Wall Boost 10 Gy in 5 fractions.   . Hyperlipidemia   . Hypertension   . Hypothyroidism, postradioiodine therapy    endocrinoloigst-  dr balan--  2004  s/p  RAI  . Menorrhagia 2011  . Multiple pulmonary nodules    secondary  metastatic  . PONV (postoperative  nausea and vomiting)    patch helps   . SUI (stress urinary incontinence, female)   . Wears contact lenses   . Wears dentures    full upper and lower partial    Past Surgical History:  Procedure Laterality Date  . BREAST LUMPECTOMY WITH NEEDLE LOCALIZATION AND AXILLARY SENTINEL LYMPH NODE BX Right 11/28/2015   Procedure: RIGHT BREAST LUMPECTOMY WITH NEEDLE LOCALIZATION AND AXILLARY SENTINEL LYMPH NODE BX;  Surgeon: Autumn Messing III, MD;  Location: [place];  Service: General;  Laterality: Right;  . BREAST REDUCTION SURGERY Bilateral 11/28/2015   Procedure: MAMMARY REDUCTION  (BREAST) BILATERAL;  Surgeon: Crissie Reese, MD;  Location: [place];  Service: Plastics;  Laterality: Bilateral;  . CESAREAN SECTION  01/12/1993  . COLONOSCOPY    . Dental surgeries    . FEMUR IM NAIL Left 01/28/2018   Procedure: INTRAMEDULLARY (IM) NAIL FEMORAL;  Surgeon: Nicholes Stairs, MD;  Location: [place];  Service: Orthopedics;  Laterality: Left;  . HYSTEROSCOPY N/A 09/02/2017   Procedure: HYSTEROSCOPY  to remove IUD;  Surgeon: Eldred Manges, MD;  Location: [facility];  Service: Gynecology;  Laterality: N/A;  . Liver biospy    . MASTECTOMY    . MASTECTOMY W/ SENTINEL NODE BIOPSY Bilateral 12/26/2015  Procedure: BILATERAL MASTECTOMY WITH LEFT SENTINEL LYMPH NODE BIOPSY;  Surgeon: Autumn Messing III, MD;  Location: [place];  Service: General;  Laterality: Bilateral;  . Removal of IUD    . TUBAL LIGATION Bilateral 1995    There were no vitals filed for this visit.  Subjective Assessment - 01/31/19 1034    Subjective  Pt states the scar massage felt good last session. She has been using her FlexiTouch at home.    Pertinent History  11/26/15- right lumpectomy, SLNB, and breast reduction and sent tissue from left breast for biopsy, 12/26/15- bilateral mastectomy and SLNB on L, pt completed radiation 08/12/17- pt diagnosed with stage 4 breast cancer in bone, liver and lung, pt is currently undergoing  chemotherapy, Oct 2019- pt had to undergo L medullary femur nailing due to impending pathologic fracture from bone mets- completed PT for 2 weeks, 2 weeks of radiation to femur, pt developed neuropathy of bilateral LEs in Dec 2019    Patient Stated Goals  to get swelling down in arm and get it controlled    Currently in Pain?  No/denies                  Outpatient Re[facility] from [facility]  Lymphedema Life Impact Scale Total Score  11.76 %           [facility] Adult PT Treatment/Exercise - 01/31/19 0001      Manual Therapy   Manual Therapy  Soft tissue mobilization;Manual Lymphatic Drainage (MLD);Compression Bandaging    Soft tissue mobilization  along right mastectomy scar to decrease scar tissue    Manual Lymphatic Drainage (MLD)  In Supine: Short neck, Rt  right chest and lateral chest at axilla inguinal nodes, Rt axillo-inguinal anastomosis, then Rt UE working from lateral upper arm to dorsal hand proximally to distal then retracing all steps. then to left sidelying for axilla and lateral chest to back     Compression Bandaging  lotion, pt donned glove, then thick stockinette from hand to wrist, artiflex x 2 from wrist to axilla, 1 6 cm bandage over glove at hand , 1 - 8 cm bandage from wrist to axilla, 1 -10 cm bandage in herringbone pattern then another 10 cm bandage                  PT Long Term Goals - 01/26/19 1322      PT LONG TERM GOAL #1   Title  Pt will demonstrate maximal reduction of edema in RUE as evidenced by stabilization of circumferential measurements    Time  4    Period  Weeks    Status  On-going      PT LONG TERM GOAL #2   Title  Pt will obtain appropriate compression garments for long term management of RUE lymphedema    Time  4    Period  Weeks    Status  On-going      PT LONG TERM GOAL #3   Title  Pt will be independent in self MLD for long term management of lymphedema    Time  4     Status  Achieved      PT LONG TERM GOAL #4   Title  Pt's husband will be able to independently don pt's compression bandages to help pt reach maximal reduction    Time  4    Period  Weeks    Status  Achieved      PT LONG  TERM GOAL #5   Title  Pt will be able to independently verbalize lymphedema risk reduction practices    Time  4    Status  On-going            Plan - 01/31/19 1122    Clinical Impression Statement  Continued with soft tissue moblization to improve scar mobility on right anterior chest. Continued MLD and compression bandaging while pt awaits arrival of compression garments.    Rehab Potential  Good    PT Frequency  2x / week    PT Duration  4 weeks    PT Treatment/Interventions  ADLs/Self Care Home Management;Manual techniques;Manual lymph drainage;Compression bandaging;Taping;Vasopneumatic Device;Patient/family education;Therapeutic exercise    PT Next Visit Plan  Cont with complete decongestive therapy. scar moblity to anterior chest    PT Home Exercise Plan  compression bandaging, self MLD and daily use of Flexitouch    Consulted and Agree with Plan of Care  Patient       Patient will benefit from skilled therapeutic intervention in order to improve the following deficits and impairments:  Postural dysfunction, Increased edema, Decreased knowledge of precautions  Visit Diagnosis: Postmastectomy lymphedema  Abnormal posture     Problem List Patient Active Problem List   Diagnosis Date Noted  . Goals of care, counseling/discussion 12/27/2018  . Osteonecrosis ([place]) 11/01/2018  . Pathological fracture in neoplastic disease, left femur, initial encounter for fracture ([place]) 01/28/2018  . Morbid obesity with BMI of 40.0-44.9, adult ([place]) 10/12/2017  . IUD complication ([place]) 123XX123  . Bone metastases ([place]) 08/13/2017  . Pain from bone metastases ([place]) 08/13/2017  . Liver metastases ([place]) 08/13/2017  . Steatosis of liver 08/13/2017  . Bilateral  breast cancer ([place]) 12/26/2015  . Malignant neoplasm of central portion of left breast in female, estrogen receptor positive ([place]) 11/28/2015  . Malignant neoplasm of upper-inner quadrant of right breast in female, estrogen receptor positive ([place]) 10/17/2015  . Hypertension 04/19/2012  . Hypothyroid 04/19/2012  . Fibroids 04/19/2012  . Stress incontinence, female 04/19/2012    Allyson Sabal [facility] 01/31/2019, 11:24 AM  [place], [place], [address] Phone: [phone]   Fax:  [phone]  Name: Debbie [name] MRN: [id number] Date of Birth: [birth year]  Manus Gunning, PT 01/31/19 11:24 AM

## 2019-02-02 ENCOUNTER — Encounter: Payer: Self-pay | Admitting: Physical Therapy

## 2019-02-02 ENCOUNTER — Other Ambulatory Visit: Payer: Self-pay

## 2019-02-02 ENCOUNTER — Ambulatory Visit: Payer: 59 | Admitting: Physical Therapy

## 2019-02-02 DIAGNOSIS — I972 Postmastectomy lymphedema syndrome: Secondary | ICD-10-CM | POA: Diagnosis not present

## 2019-02-02 DIAGNOSIS — Z9013 Acquired absence of bilateral breasts and nipples: Secondary | ICD-10-CM | POA: Diagnosis not present

## 2019-02-02 DIAGNOSIS — Z23 Encounter for immunization: Secondary | ICD-10-CM | POA: Diagnosis not present

## 2019-02-02 DIAGNOSIS — C50211 Malignant neoplasm of upper-inner quadrant of right female breast: Secondary | ICD-10-CM | POA: Diagnosis not present

## 2019-02-02 DIAGNOSIS — Z79811 Long term (current) use of aromatase inhibitors: Secondary | ICD-10-CM | POA: Diagnosis not present

## 2019-02-02 DIAGNOSIS — Z170 Estrogen receptor positive status [ER+]: Secondary | ICD-10-CM | POA: Diagnosis not present

## 2019-02-02 DIAGNOSIS — C7951 Secondary malignant neoplasm of bone: Secondary | ICD-10-CM | POA: Diagnosis not present

## 2019-02-02 NOTE — Therapy (Signed)
[place], [place], [address] Phone: [phone]   Fax:  [phone]  Physical Therapy Treatment  Patient Details  Name: Debbie [name] MRN: [id number] Date of Birth: 05-Nov-1965 Referring Provider (PT): Dr. Marlou Starks   Encounter Date: 02/02/2019  PT End of Session - 02/02/19 1556    Visit Number  15    Number of Visits  25    Date for PT Re-Evaluation  02/24/19    PT Start Time  1504    PT Stop Time  1546    PT Time Calculation (min)  42 min    Activity Tolerance  Patient tolerated treatment well    Behavior During Therapy  [facility] for tasks assessed/performed       Past Medical History:  Diagnosis Date  . Anxiety   . Arthritis   . Bone metastasis ([place])   . Breast cancer of upper-inner quadrant of right female breast [facility]) oncologist-  dr [name]--- 04/ 2019 ER/PR+ Stage IV w/ metastatic disease to liver, total spine, lung, bone   dx 06/ 2017  right breast invasive ductal carcinoma, Stage IIB, ER/PR + (pT2 pN1), grade 1-- s/p  right breast lumpecotmy w/ sln bx's and bilateral breast reduction 11-28-2015/  left breast with reduction , dx invasive ductal ca, Grade 1 (pT1cpNX) ER/PR positive/  adjuvant radiation completed 03-27-2016 to right chest wall, 2018  reclassifiec Stage IIA  . Cancer, metastatic to liver ([place])   . Chronic back pain   . Constipation   . Depression   . Family history of adverse reaction to anesthesia     mother has Copd was kept on vent   . Headache   . Heart murmur   . History of hyperthyroidism    s/p  RAI  . History of radiation therapy 02/11/16- 03/27/16   Right Chest Wall 50.4 Gy in 28 fractions, Right Chest Wall Boost 10 Gy in 5 fractions.   . Hyperlipidemia   . Hypertension   . Hypothyroidism, postradioiodine therapy    endocrinoloigst-  dr balan--  2004  s/p  RAI  . Menorrhagia 2011  . Multiple pulmonary nodules    secondary  metastatic  . PONV (postoperative  nausea and vomiting)    patch helps   . SUI (stress urinary incontinence, female)   . Wears contact lenses   . Wears dentures    full upper and lower partial    Past Surgical History:  Procedure Laterality Date  . BREAST LUMPECTOMY WITH NEEDLE LOCALIZATION AND AXILLARY SENTINEL LYMPH NODE BX Right 11/28/2015   Procedure: RIGHT BREAST LUMPECTOMY WITH NEEDLE LOCALIZATION AND AXILLARY SENTINEL LYMPH NODE BX;  Surgeon: Autumn Messing III, MD;  Location: [place];  Service: General;  Laterality: Right;  . BREAST REDUCTION SURGERY Bilateral 11/28/2015   Procedure: MAMMARY REDUCTION  (BREAST) BILATERAL;  Surgeon: Crissie Reese, MD;  Location: [place];  Service: Plastics;  Laterality: Bilateral;  . CESAREAN SECTION  01/12/1993  . COLONOSCOPY    . Dental surgeries    . FEMUR IM NAIL Left 01/28/2018   Procedure: INTRAMEDULLARY (IM) NAIL FEMORAL;  Surgeon: Nicholes Stairs, MD;  Location: [place];  Service: Orthopedics;  Laterality: Left;  . HYSTEROSCOPY N/A 09/02/2017   Procedure: HYSTEROSCOPY  to remove IUD;  Surgeon: Eldred Manges, MD;  Location: [facility];  Service: Gynecology;  Laterality: N/A;  . Liver biospy    . MASTECTOMY    . MASTECTOMY W/ SENTINEL NODE BIOPSY Bilateral 12/26/2015  Procedure: BILATERAL MASTECTOMY WITH LEFT SENTINEL LYMPH NODE BIOPSY;  Surgeon: Autumn Messing III, MD;  Location: [place];  Service: General;  Laterality: Bilateral;  . Removal of IUD    . TUBAL LIGATION Bilateral 1995    There were no vitals filed for this visit.  Subjective Assessment - 02/02/19 1508    Subjective  I just rewrapped the top two because the cotton was showing through.    Pertinent History  11/26/15- right lumpectomy, SLNB, and breast reduction and sent tissue from left breast for biopsy, 12/26/15- bilateral mastectomy and SLNB on L, pt completed radiation 08/12/17- pt diagnosed with stage 4 breast cancer in bone, liver and lung, pt is currently undergoing chemotherapy, Oct 2019- pt had to  undergo L medullary femur nailing due to impending pathologic fracture from bone mets- completed PT for 2 weeks, 2 weeks of radiation to femur, pt developed neuropathy of bilateral LEs in Dec 2019    Patient Stated Goals  to get swelling down in arm and get it controlled    Currently in Pain?  No/denies                  Outpatient Re[facility] from [facility]08/[facility]  Lymphedema Life Impact Scale Total Score  11.76 %           [facility] Adult PT Treatment/Exercise - 02/02/19 0001      Manual Therapy   Manual Lymphatic Drainage (MLD)  In Supine: Short neck, Rt  right chest and lateral chest at axilla inguinal nodes, Rt axillo-inguinal anastomosis, then Rt UE working from lateral upper arm to dorsal hand proximally to distal then retracing all steps. then to left sidelying for axilla and lateral chest to back     Compression Bandaging  lotion, pt donned glove, then thick stockinette from hand to wrist, artiflex x 2 from wrist to axilla, 1 6 cm bandage over glove at hand , 1 - 8 cm bandage from wrist to axilla, 1 -10 cm bandage in herringbone pattern then another 10 cm bandage                  PT Long Term Goals - 01/26/19 1322      PT LONG TERM GOAL #1   Title  Pt will demonstrate maximal reduction of edema in RUE as evidenced by stabilization of circumferential measurements    Time  4    Period  Weeks    Status  On-going      PT LONG TERM GOAL #2   Title  Pt will obtain appropriate compression garments for long term management of RUE lymphedema    Time  4    Period  Weeks    Status  On-going      PT LONG TERM GOAL #3   Title  Pt will be independent in self MLD for long term management of lymphedema    Time  4    Status  Achieved      PT LONG TERM GOAL #4   Title  Pt's husband will be able to independently don pt's compression bandages to help pt reach maximal reduction    Time  4    Period  Weeks    Status  Achieved       PT LONG TERM GOAL #5   Title  Pt will be able to independently verbalize lymphedema risk reduction practices    Time  4    Status  On-going  Plan - 02/02/19 1556    Clinical Impression Statement  Continued with MLD and compression bandaging today. Pt's arm appeared small after removing bandages and the back of her hand has been controlled with grey foam inside glove. Pt is awaiting arrival of compression garments and they were ordered on Tuesday.    Rehab Potential  Good    PT Frequency  2x / week    PT Duration  4 weeks    PT Treatment/Interventions  ADLs/Self Care Home Management;Manual techniques;Manual lymph drainage;Compression bandaging;Taping;Vasopneumatic Device;Patient/family education;Therapeutic exercise    PT Next Visit Plan  Cont with complete decongestive therapy. scar moblity to anterior chest    PT Home Exercise Plan  compression bandaging, self MLD and daily use of Flexitouch    Consulted and Agree with Plan of Care  Patient       Patient will benefit from skilled therapeutic intervention in order to improve the following deficits and impairments:  Postural dysfunction, Increased edema, Decreased knowledge of precautions  Visit Diagnosis: Postmastectomy lymphedema     Problem List Patient Active Problem List   Diagnosis Date Noted  . Goals of care, counseling/discussion 12/27/2018  . Osteonecrosis ([place]) 11/01/2018  . Pathological fracture in neoplastic disease, left femur, initial encounter for fracture ([place]) 01/28/2018  . Morbid obesity with BMI of 40.0-44.9, adult ([place]) 10/12/2017  . IUD complication ([place]) 123XX123  . Bone metastases ([place]) 08/13/2017  . Pain from bone metastases ([place]) 08/13/2017  . Liver metastases ([place]) 08/13/2017  . Steatosis of liver 08/13/2017  . Bilateral breast cancer ([place]) 12/26/2015  . Malignant neoplasm of central portion of left breast in female, estrogen receptor positive ([place]) 11/28/2015  . Malignant neoplasm of  upper-inner quadrant of right breast in female, estrogen receptor positive ([place]) 10/17/2015  . Hypertension 04/19/2012  . Hypothyroid 04/19/2012  . Fibroids 04/19/2012  . Stress incontinence, female 04/19/2012    Allyson Sabal [facility] 02/02/2019, 3:58 PM  [place], [place], [address] Phone: [phone]   Fax:  [phone]  Name: Debbie [name] MRN: [id number] Date of Birth: 16-Oct-1965  Manus Gunning, PT 02/02/19 3:58 PM

## 2019-02-07 ENCOUNTER — Encounter: Payer: Self-pay | Admitting: Physical Therapy

## 2019-02-07 ENCOUNTER — Ambulatory Visit: Payer: 59 | Admitting: Physical Therapy

## 2019-02-07 ENCOUNTER — Other Ambulatory Visit: Payer: Self-pay

## 2019-02-07 DIAGNOSIS — Z9013 Acquired absence of bilateral breasts and nipples: Secondary | ICD-10-CM | POA: Diagnosis not present

## 2019-02-07 DIAGNOSIS — Z23 Encounter for immunization: Secondary | ICD-10-CM | POA: Diagnosis not present

## 2019-02-07 DIAGNOSIS — Z79811 Long term (current) use of aromatase inhibitors: Secondary | ICD-10-CM | POA: Diagnosis not present

## 2019-02-07 DIAGNOSIS — I972 Postmastectomy lymphedema syndrome: Secondary | ICD-10-CM | POA: Diagnosis not present

## 2019-02-07 DIAGNOSIS — Z170 Estrogen receptor positive status [ER+]: Secondary | ICD-10-CM | POA: Diagnosis not present

## 2019-02-07 DIAGNOSIS — C7951 Secondary malignant neoplasm of bone: Secondary | ICD-10-CM | POA: Diagnosis not present

## 2019-02-07 DIAGNOSIS — C50211 Malignant neoplasm of upper-inner quadrant of right female breast: Secondary | ICD-10-CM | POA: Diagnosis not present

## 2019-02-07 NOTE — Therapy (Signed)
[place], [place], [address] Phone: [phone]   Fax:  [phone]  Physical Therapy Treatment  Patient Details  Name: Debbie [name] MRN: [id number] Date of Birth: 1965-09-13 Referring Provider (PT): Dr. Marlou Starks   Encounter Date: 02/07/2019  PT End of Session - 02/07/19 1214    Visit Number  16    Number of Visits  25    Date for PT Re-Evaluation  02/24/19    PT Start Time  1128    PT Stop Time  1212    PT Time Calculation (min)  44 min    Activity Tolerance  Patient tolerated treatment well    Behavior During Therapy  [facility] for tasks assessed/performed       Past Medical History:  Diagnosis Date  . Anxiety   . Arthritis   . Bone metastasis ([place])   . Breast cancer of upper-inner quadrant of right female breast [facility]) oncologist-  dr [name]--- 04/ 2019 ER/PR+ Stage IV w/ metastatic disease to liver, total spine, lung, bone   dx 06/ 2017  right breast invasive ductal carcinoma, Stage IIB, ER/PR + (pT2 pN1), grade 1-- s/p  right breast lumpecotmy w/ sln bx's and bilateral breast reduction 11-28-2015/  left breast with reduction , dx invasive ductal ca, Grade 1 (pT1cpNX) ER/PR positive/  adjuvant radiation completed 03-27-2016 to right chest wall, 2018  reclassifiec Stage IIA  . Cancer, metastatic to liver ([place])   . Chronic back pain   . Constipation   . Depression   . Family history of adverse reaction to anesthesia     mother has Copd was kept on vent   . Headache   . Heart murmur   . History of hyperthyroidism    s/p  RAI  . History of radiation therapy 02/11/16- 03/27/16   Right Chest Wall 50.4 Gy in 28 fractions, Right Chest Wall Boost 10 Gy in 5 fractions.   . Hyperlipidemia   . Hypertension   . Hypothyroidism, postradioiodine therapy    endocrinoloigst-  dr balan--  2004  s/p  RAI  . Menorrhagia 2011  . Multiple pulmonary nodules    secondary  metastatic  . PONV (postoperative  nausea and vomiting)    patch helps   . SUI (stress urinary incontinence, female)   . Wears contact lenses   . Wears dentures    full upper and lower partial    Past Surgical History:  Procedure Laterality Date  . BREAST LUMPECTOMY WITH NEEDLE LOCALIZATION AND AXILLARY SENTINEL LYMPH NODE BX Right 11/28/2015   Procedure: RIGHT BREAST LUMPECTOMY WITH NEEDLE LOCALIZATION AND AXILLARY SENTINEL LYMPH NODE BX;  Surgeon: Autumn Messing III, MD;  Location: [place];  Service: General;  Laterality: Right;  . BREAST REDUCTION SURGERY Bilateral 11/28/2015   Procedure: MAMMARY REDUCTION  (BREAST) BILATERAL;  Surgeon: Crissie Reese, MD;  Location: [place];  Service: Plastics;  Laterality: Bilateral;  . CESAREAN SECTION  01/12/1993  . COLONOSCOPY    . Dental surgeries    . FEMUR IM NAIL Left 01/28/2018   Procedure: INTRAMEDULLARY (IM) NAIL FEMORAL;  Surgeon: Nicholes Stairs, MD;  Location: [place];  Service: Orthopedics;  Laterality: Left;  . HYSTEROSCOPY N/A 09/02/2017   Procedure: HYSTEROSCOPY  to remove IUD;  Surgeon: Eldred Manges, MD;  Location: [facility];  Service: Gynecology;  Laterality: N/A;  . Liver biospy    . MASTECTOMY    . MASTECTOMY W/ SENTINEL NODE BIOPSY Bilateral 12/26/2015  Procedure: BILATERAL MASTECTOMY WITH LEFT SENTINEL LYMPH NODE BIOPSY;  Surgeon: Autumn Messing III, MD;  Location: [place];  Service: General;  Laterality: Bilateral;  . Removal of IUD    . TUBAL LIGATION Bilateral 1995    There were no vitals filed for this visit.  Subjective Assessment - 02/07/19 1129    Subjective  I just got my sleeves in the mail today so I will bring them on Thursday.    Pertinent History  11/26/15- right lumpectomy, SLNB, and breast reduction and sent tissue from left breast for biopsy, 12/26/15- bilateral mastectomy and SLNB on L, pt completed radiation 08/12/17- pt diagnosed with stage 4 breast cancer in bone, liver and lung, pt is currently undergoing chemotherapy, Oct 2019- pt had  to undergo L medullary femur nailing due to impending pathologic fracture from bone mets- completed PT for 2 weeks, 2 weeks of radiation to femur, pt developed neuropathy of bilateral LEs in Dec 2019    Patient Stated Goals  to get swelling down in arm and get it controlled    Currently in Pain?  No/denies            LYMPHEDEMA/ONCOLOGY QUESTIONNAIRE - 02/07/19 1131      Right Upper Extremity Lymphedema   15 cm Proximal to Olecranon Process  38 cm    Olecranon Process  35 cm    15 cm Proximal to Ulnar Styloid Process  30 cm    Just Proximal to Ulnar Styloid Process  19 cm    Across Hand at [employer]  21.8 cm    At [place] of 2nd Digit  7.5 cm           Outpatient Re[facility] from [facility]08/2018 [facility]  Lymphedema Life Impact Scale Total Score  11.76 %           [facility] Adult PT Treatment/Exercise - 02/07/19 0001      Manual Therapy   Manual Lymphatic Drainage (MLD)  In supine: short neck, superficial and deep abdominals, right inguinal nodes and establishment of axillo inguinal pathway, R UE working proximal to distal then retracing all steps    Compression Bandaging  lotion, pt donned glove, then thick stockinette from hand to wrist, artiflex x 2 from wrist to axilla, 1 6 cm bandage over glove at hand , 1 - 8 cm bandage from wrist to axilla, 1 -10 cm bandage in herringbone pattern then another 10 cm bandage                  PT Long Term Goals - 01/26/19 1322      PT LONG TERM GOAL #1   Title  Pt will demonstrate maximal reduction of edema in RUE as evidenced by stabilization of circumferential measurements    Time  4    Period  Weeks    Status  On-going      PT LONG TERM GOAL #2   Title  Pt will obtain appropriate compression garments for long term management of RUE lymphedema    Time  4    Period  Weeks    Status  On-going      PT LONG TERM GOAL #3   Title  Pt will be independent in self MLD for long term  management of lymphedema    Time  4    Status  Achieved      PT LONG TERM GOAL #4   Title  Pt's husband will be able to independently  don pt's compression bandages to help pt reach maximal reduction    Time  4    Period  Weeks    Status  Achieved      PT LONG TERM GOAL #5   Title  Pt will be able to independently verbalize lymphedema risk reduction practices    Time  4    Status  On-going            Plan - 02/07/19 1215    Clinical Impression Statement  Continued with MLD and compression bandaging today. Pt received her garments today in the mail but she has been at work all morning and has not been able to get to them yet. Educated pt on how to don garments when she opens them. Pt to try them before next session and to bring them to next session.    Stability/Clinical Decision Making  Evolving/Moderate complexity    Rehab Potential  Good    PT Frequency  2x / week    PT Duration  4 weeks    PT Treatment/Interventions  ADLs/Self Care Home Management;Manual techniques;Manual lymph drainage;Compression bandaging;Taping;Vasopneumatic Device;Patient/family education;Therapeutic exercise    PT Next Visit Plan  assess garments for fit, Cont with complete decongestive therapy. scar moblity to anterior chest    PT Home Exercise Plan  compression bandaging, self MLD and daily use of Flexitouch    Consulted and Agree with Plan of Care  Patient       Patient will benefit from skilled therapeutic intervention in order to improve the following deficits and impairments:  Postural dysfunction, Increased edema, Decreased knowledge of precautions  Visit Diagnosis: Postmastectomy lymphedema     Problem List Patient Active Problem List   Diagnosis Date Noted  . Goals of care, counseling/discussion 12/27/2018  . Osteonecrosis ([place]) 11/01/2018  . Pathological fracture in neoplastic disease, left femur, initial encounter for fracture ([place]) 01/28/2018  . Morbid obesity with BMI of  40.0-44.9, adult ([place]) 10/12/2017  . IUD complication ([place]) 123XX123  . Bone metastases ([place]) 08/13/2017  . Pain from bone metastases ([place]) 08/13/2017  . Liver metastases ([place]) 08/13/2017  . Steatosis of liver 08/13/2017  . Bilateral breast cancer ([place]) 12/26/2015  . Malignant neoplasm of central portion of left breast in female, estrogen receptor positive ([place]) 11/28/2015  . Malignant neoplasm of upper-inner quadrant of right breast in female, estrogen receptor positive ([place]) 10/17/2015  . Hypertension 04/19/2012  . Hypothyroid 04/19/2012  . Fibroids 04/19/2012  . Stress incontinence, female 04/19/2012    Allyson Sabal [facility] 02/07/2019, 12:17 PM  [place], [place], [address] Phone: [phone]   Fax:  [phone]  Name: LENAMAE DROGE MRN: [id number] Date of Birth: December 16, 1965  Manus Gunning, PT 02/07/19 12:17 PM

## 2019-02-09 ENCOUNTER — Ambulatory Visit: Payer: 59 | Admitting: Physical Therapy

## 2019-02-09 ENCOUNTER — Encounter: Payer: Self-pay | Admitting: Physical Therapy

## 2019-02-09 ENCOUNTER — Other Ambulatory Visit: Payer: Self-pay

## 2019-02-09 DIAGNOSIS — Z79811 Long term (current) use of aromatase inhibitors: Secondary | ICD-10-CM | POA: Diagnosis not present

## 2019-02-09 DIAGNOSIS — Z170 Estrogen receptor positive status [ER+]: Secondary | ICD-10-CM | POA: Diagnosis not present

## 2019-02-09 DIAGNOSIS — M6281 Muscle weakness (generalized): Secondary | ICD-10-CM | POA: Insufficient documentation

## 2019-02-09 DIAGNOSIS — C787 Secondary malignant neoplasm of liver and intrahepatic bile duct: Secondary | ICD-10-CM | POA: Diagnosis not present

## 2019-02-09 DIAGNOSIS — I972 Postmastectomy lymphedema syndrome: Secondary | ICD-10-CM | POA: Insufficient documentation

## 2019-02-09 DIAGNOSIS — Z923 Personal history of irradiation: Secondary | ICD-10-CM | POA: Diagnosis not present

## 2019-02-09 DIAGNOSIS — R293 Abnormal posture: Secondary | ICD-10-CM | POA: Insufficient documentation

## 2019-02-09 DIAGNOSIS — C7951 Secondary malignant neoplasm of bone: Secondary | ICD-10-CM | POA: Diagnosis not present

## 2019-02-09 DIAGNOSIS — Z853 Personal history of malignant neoplasm of breast: Secondary | ICD-10-CM | POA: Diagnosis not present

## 2019-02-09 DIAGNOSIS — Z9013 Acquired absence of bilateral breasts and nipples: Secondary | ICD-10-CM | POA: Diagnosis not present

## 2019-02-09 MED FILL — PREGABALIN 100 MG CAPS: 100 | 30 days supply | Qty: 90 | Fill #1

## 2019-02-09 MED FILL — AFINITOR 10 MG TABLET: 10 | 28 days supply | Qty: 28 | Fill #1

## 2019-02-09 NOTE — Therapy (Signed)
[place], [place], [address] Phone: [phone]   Fax:  [phone]  Physical Therapy Treatment  Patient Details  Name: Debbie [name] MRN: [id number] Date of Birth: [birth year] Referring Provider (PT): Dr. Marlou Starks   Encounter Date: 02/09/2019  PT End of Session - 02/09/19 1123    Visit Number  16    Number of Visits  25    Date for PT Re-Evaluation  02/24/19    PT Start Time  1034    PT Stop Time  1115    PT Time Calculation (min)  41 min    Activity Tolerance  Patient tolerated treatment well    Behavior During Therapy  [facility] for tasks assessed/performed       Past Medical History:  Diagnosis Date  . Anxiety   . Arthritis   . Bone metastasis ([place])   . Breast cancer of upper-inner quadrant of right female breast [facility]) oncologist-  dr [name]--- 04/ 2019 ER/PR+ Stage IV w/ metastatic disease to liver, total spine, lung, bone   dx 06/ 2017  right breast invasive ductal carcinoma, Stage IIB, ER/PR + (pT2 pN1), grade 1-- s/p  right breast lumpecotmy w/ sln bx's and bilateral breast reduction 11-28-2015/  left breast with reduction , dx invasive ductal ca, Grade 1 (pT1cpNX) ER/PR positive/  adjuvant radiation completed 03-27-2016 to right chest wall, 2018  reclassifiec Stage IIA  . Cancer, metastatic to liver ([place])   . Chronic back pain   . Constipation   . Depression   . Family history of adverse reaction to anesthesia     mother has Copd was kept on vent   . Headache   . Heart murmur   . History of hyperthyroidism    s/p  RAI  . History of radiation therapy 02/11/16- 03/27/16   Right Chest Wall 50.4 Gy in 28 fractions, Right Chest Wall Boost 10 Gy in 5 fractions.   . Hyperlipidemia   . Hypertension   . Hypothyroidism, postradioiodine therapy    endocrinoloigst-  dr balan--  2004  s/p  RAI  . Menorrhagia 2011  . Multiple pulmonary nodules    secondary  metastatic  . PONV (postoperative  nausea and vomiting)    patch helps   . SUI (stress urinary incontinence, female)   . Wears contact lenses   . Wears dentures    full upper and lower partial    Past Surgical History:  Procedure Laterality Date  . BREAST LUMPECTOMY WITH NEEDLE LOCALIZATION AND AXILLARY SENTINEL LYMPH NODE BX Right 11/28/2015   Procedure: RIGHT BREAST LUMPECTOMY WITH NEEDLE LOCALIZATION AND AXILLARY SENTINEL LYMPH NODE BX;  Surgeon: Autumn Messing III, MD;  Location: [place];  Service: General;  Laterality: Right;  . BREAST REDUCTION SURGERY Bilateral 11/28/2015   Procedure: MAMMARY REDUCTION  (BREAST) BILATERAL;  Surgeon: Crissie [place], MD;  Location: [facility];  Service: Plastics;  Laterality: Bilateral;  . CESAREAN SECTION  01/12/1993  . COLONOSCOPY    . Dental surgeries    . FEMUR IM NAIL Left 01/28/2018   Procedure: INTRAMEDULLARY (IM) NAIL FEMORAL;  Surgeon: Nicholes Stairs, MD;  Location: [facility];  Service: Orthopedics;  Laterality: Left;  . HYSTEROSCOPY N/A 09/02/2017   Procedure: HYSTEROSCOPY  to remove IUD;  Surgeon: Eldred Manges, MD;  Location: [facility];  Service: Gynecology;  Laterality: N/A;  . Liver biospy    . MASTECTOMY    . MASTECTOMY W/ SENTINEL NODE BIOPSY Bilateral 12/26/2015  Procedure: BILATERAL MASTECTOMY WITH LEFT SENTINEL LYMPH NODE BIOPSY;  Surgeon: Autumn Messing III, MD;  Location: [facility];  Service: General;  Laterality: Bilateral;  . Removal of IUD    . TUBAL LIGATION Bilateral 1995    There were no vitals filed for this visit.  Subjective Assessment - 02/09/19 1035    Subjective  The sleeve and glove feels good.    Pertinent History  11/26/15- right lumpectomy, SLNB, and breast reduction and sent tissue from left breast for biopsy, 12/26/15- bilateral mastectomy and SLNB on L, pt completed radiation 08/12/17- pt diagnosed with stage 4 breast cancer in bone, liver and lung, pt is currently undergoing chemotherapy, Oct 2019- pt had to undergo L medullary femur nailing due  to impending pathologic fracture from bone mets- completed PT for 2 weeks, 2 weeks of radiation to femur, pt developed neuropathy of bilateral LEs in Dec 2019    Patient Stated Goals  to get swelling down in arm and get it controlled    Currently in Pain?  No/denies            LYMPHEDEMA/ONCOLOGY QUESTIONNAIRE - 02/09/19 1042      Right Upper Extremity Lymphedema   15 cm Proximal to Olecranon Process  40.5 cm    Olecranon Process  34.5 cm    15 cm Proximal to Ulnar Styloid Process  33 cm    Just Proximal to Ulnar Styloid Process  21.5 cm    Across Hand at [employer]  22.6 cm    At [place] of 2nd Digit  7.5 cm           Outpatient Re[facility]08/[facility]  Lymphedema Life Impact Scale Total Score  11.76 %           [facility] Adult PT Treatment/Exercise - 02/09/19 0001      Manual Therapy   Edema Management  pt received sleeve and glove, assessed garments for fit, created foam "bracelet" for pt to wear around wrist to decrease discomfort in this area    Manual Lymphatic Drainage (MLD)  In supine: short neck, superficial and deep abdominals, right inguinal nodes and establishment of axillo inguinal pathway, R UE working proximal to distal then retracing all steps                  PT Long Term Goals - 01/26/19 1322      PT LONG TERM GOAL #1   Title  Pt will demonstrate maximal reduction of edema in RUE as evidenced by stabilization of circumferential measurements    Time  4    Period  Weeks    Status  On-going      PT LONG TERM GOAL #2   Title  Pt will obtain appropriate compression garments for long term management of RUE lymphedema    Time  4    Period  Weeks    Status  On-going      PT LONG TERM GOAL #3   Title  Pt will be independent in self MLD for long term management of lymphedema    Time  4    Status  Achieved      PT LONG TERM GOAL #4   Title  Pt's husband will be able to independently don pt's  compression bandages to help pt reach maximal reduction    Time  4    Period  Weeks    Status  Achieved      PT  LONG TERM GOAL #5   Title  Pt will be able to independently verbalize lymphedema risk reduction practices    Time  4    Status  On-going            Plan - 02/09/19 1124    Clinical Impression Statement  Pt received her compression sleeve and glove and arrived to appointment with both donned. She has been wearing them for 2 days and bandaging at night. She has also been using her compression pump. Her measurements have increased since her last session. She thought she was supposed to have pockets built in this sleeve adn glove for foam to help areas that are more prone to swelling but this sleeve adn glove does not have any. Will contact DME provider about this. Pt is still awaiting arrival of night time garment. Created a foam "bracelet" for pt to wear around wrist to help decrease discomfort in this area.    PT Frequency  2x / week    PT Duration  4 weeks    PT Treatment/Interventions  ADLs/Self Care Home Management;Manual techniques;Manual lymph drainage;Compression bandaging;Taping;Vasopneumatic Device;Patient/family education;Therapeutic exercise    PT Next Visit Plan  see if she talked to [facility] about night time garment and pockets in garments, Cont with complete decongestive therapy. scar moblity to anterior chest    PT Home Exercise Plan  compression bandaging, self MLD and daily use of Flexitouch    Consulted and Agree with Plan of Care  Patient       Patient will benefit from skilled therapeutic intervention in order to improve the following deficits and impairments:  Postural dysfunction, Increased edema, Decreased knowledge of precautions  Visit Diagnosis: Postmastectomy lymphedema     Problem List Patient Active Problem List   Diagnosis Date Noted  . Goals of care, counseling/discussion 12/27/2018  . Osteonecrosis ([place]) 11/01/2018  . Pathological  fracture in neoplastic disease, left femur, initial encounter for fracture ([place]) 01/28/2018  . Morbid obesity with BMI of 40.0-44.9, adult ([place]) 10/12/2017  . IUD complication ([place]) 123XX123  . Bone metastases ([place]) 08/13/2017  . Pain from bone metastases ([place]) 08/13/2017  . Liver metastases ([place]) 08/13/2017  . Steatosis of liver 08/13/2017  . Bilateral breast cancer ([place]) 12/26/2015  . Malignant neoplasm of central portion of left breast in female, estrogen receptor positive ([place]) 11/28/2015  . Malignant neoplasm of upper-inner quadrant of right breast in female, estrogen receptor positive ([place]) 10/17/2015  . Hypertension 04/19/2012  . Hypothyroid 04/19/2012  . Fibroids 04/19/2012  . Stress incontinence, female 04/19/2012    [place] 02/09/2019, 11:27 AM  [place], [place], [address] Phone: [phone]   Fax:  [phone]  Name: Debbie [name] MRN: [id number] Date of Birth: Feb 24, 1966  Manus Gunning, PT 02/09/19 11:28 AM

## 2019-02-10 DIAGNOSIS — I890 Lymphedema, not elsewhere classified: Secondary | ICD-10-CM | POA: Diagnosis not present

## 2019-02-10 DIAGNOSIS — C50911 Malignant neoplasm of unspecified site of right female breast: Secondary | ICD-10-CM | POA: Diagnosis not present

## 2019-02-13 ENCOUNTER — Ambulatory Visit: Payer: 59 | Admitting: Physical Therapy

## 2019-02-13 ENCOUNTER — Encounter: Payer: Self-pay | Admitting: Physical Therapy

## 2019-02-13 ENCOUNTER — Other Ambulatory Visit: Payer: Self-pay

## 2019-02-13 DIAGNOSIS — C787 Secondary malignant neoplasm of liver and intrahepatic bile duct: Secondary | ICD-10-CM | POA: Diagnosis not present

## 2019-02-13 DIAGNOSIS — Z853 Personal history of malignant neoplasm of breast: Secondary | ICD-10-CM | POA: Diagnosis not present

## 2019-02-13 DIAGNOSIS — Z170 Estrogen receptor positive status [ER+]: Secondary | ICD-10-CM | POA: Diagnosis not present

## 2019-02-13 DIAGNOSIS — M6281 Muscle weakness (generalized): Secondary | ICD-10-CM | POA: Diagnosis not present

## 2019-02-13 DIAGNOSIS — I972 Postmastectomy lymphedema syndrome: Secondary | ICD-10-CM | POA: Diagnosis not present

## 2019-02-13 DIAGNOSIS — R293 Abnormal posture: Secondary | ICD-10-CM

## 2019-02-13 DIAGNOSIS — Z923 Personal history of irradiation: Secondary | ICD-10-CM | POA: Diagnosis not present

## 2019-02-13 DIAGNOSIS — Z9013 Acquired absence of bilateral breasts and nipples: Secondary | ICD-10-CM | POA: Diagnosis not present

## 2019-02-13 DIAGNOSIS — C7951 Secondary malignant neoplasm of bone: Secondary | ICD-10-CM | POA: Diagnosis not present

## 2019-02-13 NOTE — Therapy (Signed)
[place], [place], [address] Phone: [phone]   Fax:  [phone]  Physical Therapy Treatment  Patient Details  Name: Debbie [name] MRN: [id number] Date of Birth: [birth year] Referring Provider (PT): Dr. Marlou Starks   Encounter Date: 02/13/2019  PT End of Session - 02/13/19 1309    Visit Number  17    Date for PT Re-Evaluation  02/24/19    PT Start Time  1100    PT Stop Time  1145    PT Time Calculation (min)  45 min    Activity Tolerance  Patient tolerated treatment well    Behavior During Therapy  [facility] for tasks assessed/performed       Past Medical History:  Diagnosis Date  . Anxiety   . Arthritis   . Bone metastasis ([place])   . Breast cancer of upper-inner quadrant of right female breast [facility]) oncologist-  dr [name]--- 04/ 2019 ER/PR+ Stage IV w/ metastatic disease to liver, total spine, lung, bone   dx 06/ 2017  right breast invasive ductal carcinoma, Stage IIB, ER/PR + (pT2 pN1), grade 1-- s/p  right breast lumpecotmy w/ sln bx's and bilateral breast reduction 11-28-2015/  left breast with reduction , dx invasive ductal ca, Grade 1 (pT1cpNX) ER/PR positive/  adjuvant radiation completed 03-27-2016 to right chest wall, 2018  reclassifiec Stage IIA  . Cancer, metastatic to liver ([place])   . Chronic back pain   . Constipation   . Depression   . Family history of adverse reaction to anesthesia     mother has Copd was kept on vent   . Headache   . Heart murmur   . History of hyperthyroidism    s/p  RAI  . History of radiation therapy 02/11/16- 03/27/16   Right Chest Wall 50.4 Gy in 28 fractions, Right Chest Wall Boost 10 Gy in 5 fractions.   . Hyperlipidemia   . Hypertension   . Hypothyroidism, postradioiodine therapy    endocrinoloigst-  dr balan--  2004  s/p  RAI  . Menorrhagia 2011  . Multiple pulmonary nodules    secondary  metastatic  . PONV (postoperative nausea and vomiting)    patch  helps   . SUI (stress urinary incontinence, female)   . Wears contact lenses   . Wears dentures    full upper and lower partial    Past Surgical History:  Procedure Laterality Date  . BREAST LUMPECTOMY WITH NEEDLE LOCALIZATION AND AXILLARY SENTINEL LYMPH NODE BX Right 11/28/2015   Procedure: RIGHT BREAST LUMPECTOMY WITH NEEDLE LOCALIZATION AND AXILLARY SENTINEL LYMPH NODE BX;  Surgeon: Autumn Messing III, MD;  Location: [facility];  Service: General;  Laterality: Right;  . BREAST REDUCTION SURGERY Bilateral 11/28/2015   Procedure: MAMMARY REDUCTION  (BREAST) BILATERAL;  Surgeon: Crissie [place], MD;  Location: [facility];  Service: Plastics;  Laterality: Bilateral;  . CESAREAN SECTION  01/12/1993  . COLONOSCOPY    . Dental surgeries    . FEMUR IM NAIL Left 01/28/2018   Procedure: INTRAMEDULLARY (IM) NAIL FEMORAL;  Surgeon: Nicholes Stairs, MD;  Location: [facility];  Service: Orthopedics;  Laterality: Left;  . HYSTEROSCOPY N/A 09/02/2017   Procedure: HYSTEROSCOPY  to remove IUD;  Surgeon: Eldred Manges, MD;  Location: [facility];  Service: Gynecology;  Laterality: N/A;  . Liver biospy    . MASTECTOMY    . MASTECTOMY W/ SENTINEL NODE BIOPSY Bilateral 12/26/2015   Procedure: BILATERAL MASTECTOMY WITH LEFT SENTINEL  LYMPH NODE BIOPSY;  Surgeon: Autumn Messing III, MD;  Location: [facility];  Service: General;  Laterality: Bilateral;  . Removal of IUD    . TUBAL LIGATION Bilateral 1995    There were no vitals filed for this visit.  Subjective Assessment - 02/13/19 1303    Subjective  pt got her nighttime garment .  She has been wearing it each night but had to take it off about 2 am  She has not called Melissa about her sleeve.  It has been very tight especially around her wrist.  When she removed her sleeve and glove, she had a very tight constriction mark around her wrist, with hand and forearm visibly swollen                  Outpatient Rehab from 12/08/2018 in [place]  Lymphedema Life Impact Scale Total Score  11.76 %           [facility] Adult PT Treatment/Exercise - 02/13/19 0001      Manual Therapy   Edema Management  tried on [place] night.  Pt will difficulty getting it pulled up and she wonders if there could be a zipper placed in it. Showed pt how to use the Medi butler to apply her sleeve and  she was able to get it pulled up higher.  Send an email and phoned Melissa( had to leave a message) about the problems with fit of both garments.     Manual Lymphatic Drainage (MLD)  In Supine: Short neck, Rt  right chest and lateral chest at axilla inguinal nodes, Rt axillo-inguinal anastomosis, then Rt UE working from lateral upper arm to dorsal hand proximally to distal then retracing all steps. then to left sidelying for axilla and lateral chest to back                   PT Long Term Goals - 01/26/19 1322      PT LONG TERM GOAL #1   Title  Pt will demonstrate maximal reduction of edema in RUE as evidenced by stabilization of circumferential measurements    Time  4    Period  Weeks    Status  On-going      PT LONG TERM GOAL #2   Title  Pt will obtain appropriate compression garments for long term management of RUE lymphedema    Time  4    Period  Weeks    Status  On-going      PT LONG TERM GOAL #3   Title  Pt will be independent in self MLD for long term management of lymphedema    Time  4    Status  Achieved      PT LONG TERM GOAL #4   Title  Pt's husband will be able to independently don pt's compression bandages to help pt reach maximal reduction    Time  4    Period  Weeks    Status  Achieved      PT LONG TERM GOAL #5   Title  Pt will be able to independently verbalize lymphedema risk reduction practices    Time  4    Status  On-going            Plan - 02/13/19 1309    Clinical Impression Statement  Pt has received her day and night garments and her Flexitouch but is still having trouble  with lymphedema managment  She also reports back pain  and abdominal and leg swelling that she thinks is coming from her cancer medicine. Sent and email to [employer] with [facility] therapy to see if that is an option for her.  Also contacted [employer] [facility] for reassessment of garment fitting as she may be able to get some revisions.    Comorbidities  metastatic disease, hx of radiation, neuropathy    Stability/Clinical Decision Making  Evolving/Moderate complexity    PT Treatment/Interventions  Gait training    PT Next Visit Plan  see if she has heard from  [facility] about night time garment and pockets in garments, (possibly schedule on [place] 12?) Cont with complete decongestive therapy. scar moblity to anterior chest    Consulted and Agree with Plan of Care  Patient       Patient will benefit from skilled therapeutic intervention in order to improve the following deficits and impairments:  Postural dysfunction, Increased edema, Decreased knowledge of precautions  Visit Diagnosis: Postmastectomy lymphedema  Abnormal posture     Problem List Patient Active Problem List   Diagnosis Date Noted  . Goals of care, counseling/discussion 12/27/2018  . Osteonecrosis ([place]) 11/01/2018  . Pathological fracture in neoplastic disease, left femur, initial encounter for fracture ([place]) 01/28/2018  . Morbid obesity with BMI of 40.0-44.9, adult ([place]) 10/12/2017  . IUD complication ([place]) 123XX123  . Bone metastases ([place]) 08/13/2017  . Pain from bone metastases ([place]) 08/13/2017  . Liver metastases ([place]) 08/13/2017  . Steatosis of liver 08/13/2017  . Bilateral breast cancer ([place]) 12/26/2015  . Malignant neoplasm of central portion of left breast in female, estrogen receptor positive ([place]) 11/28/2015  . Malignant neoplasm of upper-inner quadrant of right breast in female, estrogen receptor positive ([place]) 10/17/2015  . Hypertension 04/19/2012  . Hypothyroid 04/19/2012  . Fibroids 04/19/2012  .  Stress incontinence, female 04/19/2012   Donato Heinz. Owens Shark PT  [place] 02/13/2019, 1:13 PM  [place], [place], [address] Phone: [phone]   Fax:  [phone]  Name: Debbie [name] MRN: [id number] Date of Birth: February 26, 1966

## 2019-02-16 ENCOUNTER — Ambulatory Visit: Payer: 59 | Admitting: Physical Therapy

## 2019-02-16 ENCOUNTER — Other Ambulatory Visit: Payer: Self-pay

## 2019-02-16 DIAGNOSIS — C787 Secondary malignant neoplasm of liver and intrahepatic bile duct: Secondary | ICD-10-CM | POA: Diagnosis not present

## 2019-02-16 DIAGNOSIS — I972 Postmastectomy lymphedema syndrome: Secondary | ICD-10-CM | POA: Diagnosis not present

## 2019-02-16 DIAGNOSIS — Z170 Estrogen receptor positive status [ER+]: Secondary | ICD-10-CM | POA: Diagnosis not present

## 2019-02-16 DIAGNOSIS — R293 Abnormal posture: Secondary | ICD-10-CM | POA: Diagnosis not present

## 2019-02-16 DIAGNOSIS — C7951 Secondary malignant neoplasm of bone: Secondary | ICD-10-CM | POA: Diagnosis not present

## 2019-02-16 DIAGNOSIS — Z9013 Acquired absence of bilateral breasts and nipples: Secondary | ICD-10-CM | POA: Diagnosis not present

## 2019-02-16 DIAGNOSIS — Z853 Personal history of malignant neoplasm of breast: Secondary | ICD-10-CM | POA: Diagnosis not present

## 2019-02-16 DIAGNOSIS — Z923 Personal history of irradiation: Secondary | ICD-10-CM | POA: Diagnosis not present

## 2019-02-16 DIAGNOSIS — M6281 Muscle weakness (generalized): Secondary | ICD-10-CM | POA: Diagnosis not present

## 2019-02-16 NOTE — Therapy (Signed)
[place], [place], [address] Phone: [phone]   Fax:  [phone]  Physical Therapy Treatment  Patient Details  Name: Debbie [name] MRN: [id number] Date of Birth: [birth year] Referring Provider (PT): Dr. Marlou Starks   Encounter Date: 02/16/2019  PT End of Session - 02/16/19 1835    Visit Number  17    Number of Visits  41    Date for PT Re-Evaluation  04/19/19    PT Start Time  1300    PT Stop Time  1345    PT Time Calculation (min)  45 min    Activity Tolerance  Patient tolerated treatment well    Behavior During Therapy  [facility] for tasks assessed/performed       Past Medical History:  Diagnosis Date  . Anxiety   . Arthritis   . Bone metastasis ([place])   . Breast cancer of upper-inner quadrant of right female breast [facility]) oncologist-  dr [name]--- 04/ 2019 ER/PR+ Stage IV w/ metastatic disease to liver, total spine, lung, bone   dx 06/ 2017  right breast invasive ductal carcinoma, Stage IIB, ER/PR + (pT2 pN1), grade 1-- s/p  right breast lumpecotmy w/ sln bx's and bilateral breast reduction 11-28-2015/  left breast with reduction , dx invasive ductal ca, Grade 1 (pT1cpNX) ER/PR positive/  adjuvant radiation completed 03-27-2016 to right chest wall, 2018  reclassifiec Stage IIA  . Cancer, metastatic to liver ([place])   . Chronic back pain   . Constipation   . Depression   . Family history of adverse reaction to anesthesia     mother has Copd was kept on vent   . Headache   . Heart murmur   . History of hyperthyroidism    s/p  RAI  . History of radiation therapy 02/11/16- 03/27/16   Right Chest Wall 50.4 Gy in 28 fractions, Right Chest Wall Boost 10 Gy in 5 fractions.   . Hyperlipidemia   . Hypertension   . Hypothyroidism, postradioiodine therapy    endocrinoloigst-  dr balan--  2004  s/p  RAI  . Menorrhagia 2011  . Multiple pulmonary nodules    secondary  metastatic  . PONV (postoperative  nausea and vomiting)    patch helps   . SUI (stress urinary incontinence, female)   . Wears contact lenses   . Wears dentures    full upper and lower partial    Past Surgical History:  Procedure Laterality Date  . BREAST LUMPECTOMY WITH NEEDLE LOCALIZATION AND AXILLARY SENTINEL LYMPH NODE BX Right 11/28/2015   Procedure: RIGHT BREAST LUMPECTOMY WITH NEEDLE LOCALIZATION AND AXILLARY SENTINEL LYMPH NODE BX;  Surgeon: Autumn Messing III, MD;  Location: [facility];  Service: General;  Laterality: Right;  . BREAST REDUCTION SURGERY Bilateral 11/28/2015   Procedure: MAMMARY REDUCTION  (BREAST) BILATERAL;  Surgeon: Crissie Reese, MD;  Location: [facility];  Service: Plastics;  Laterality: Bilateral;  . CESAREAN SECTION  01/12/1993  . COLONOSCOPY    . Dental surgeries    . FEMUR IM NAIL Left 01/28/2018   Procedure: INTRAMEDULLARY (IM) NAIL FEMORAL;  Surgeon: Nicholes Stairs, MD;  Location: [facility];  Service: Orthopedics;  Laterality: Left;  . HYSTEROSCOPY N/A 09/02/2017   Procedure: HYSTEROSCOPY  to remove IUD;  Surgeon: Eldred Manges, MD;  Location: [facility];  Service: Gynecology;  Laterality: N/A;  . Liver biospy    . MASTECTOMY    . MASTECTOMY W/ SENTINEL NODE BIOPSY Bilateral 12/26/2015  Procedure: BILATERAL MASTECTOMY WITH LEFT SENTINEL LYMPH NODE BIOPSY;  Surgeon: Autumn Messing III, MD;  Location: [facility];  Service: General;  Laterality: Bilateral;  . Removal of IUD    . TUBAL LIGATION Bilateral 1995    There were no vitals filed for this visit.  Subjective Assessment - 02/16/19 1447    Subjective  Pt says she is feeling well today. She does not have to work today, but knows that after she does her grocery shopping her back and legs will be hurtings.  She is excited to be starting aquatic therapy +++         [facility] PT Assessment - 02/16/19 0001      Assessment   Medical Diagnosis  left and right breast cancer    Referring Provider (PT)  Dr. Marlou Starks    Onset Date/Surgical Date   11/26/15      Prior Function   Level of Independence  Independent        LYMPHEDEMA/ONCOLOGY QUESTIONNAIRE - 02/16/19 1309      Right Upper Extremity Lymphedema   15 cm Proximal to Olecranon Process  40.5 cm    Olecranon Process  33.7 cm    15 cm Proximal to Ulnar Styloid Process  33 cm    Just Proximal to Ulnar Styloid Process  18.5 cm    Across Hand at [employer]  21.5 cm    At [place] of 2nd Digit  70 cm           Outpatient Re[facility]/08/[facility]  Lymphedema Life Impact Scale Total Score  11.76 %           [facility] Adult PT Treatment/Exercise - 02/16/19 0001      Manual Therapy   Manual therapy comments  Pt will be reameasured on Monday day and night time sleeve that will better suit her. She needs more compression at back of hand and the wrist it too tight on daytime garments and she had difficulty pullin her nighttime garment up and needs either a zipper or a 2 piece garment     Edema Management  remeasured arm. pt left wearing her glove and tg soft on arm to try to smooth our the wrist      Manual Lymphatic Drainage (MLD)  In Supine: Short neck, Rt  right chest and lateral chest at axilla inguinal nodes, Rt axillo-inguinal anastomosis, then Rt UE working from lateral upper arm to dorsal hand proximally to distal then retracing all steps. then to left sidelying for axilla and lateral chest to back    extra time spent on hand and forearm             PT Education - 02/16/19 1835    Education Details  compression bandaging    Person(s) Educated  Patient    Methods  Explanation;Demonstration    Comprehension  Verbalized understanding          PT Long Term Goals - 02/16/19 1843      PT LONG TERM GOAL #1   Title  Pt will demonstrate maximal reduction of edema in RUE as evidenced by stabilization of circumferential measurements    Time  8    Period  Weeks    Status  On-going      PT LONG TERM GOAL #2    Title  Pt will obtain appropriate compression garments for long term management of RUE lymphedema    Time  8  Period  Weeks      PT LONG TERM GOAL #3   Title  Pt will be independent in self MLD for long term management of lymphedema    Time  8    Period  Weeks    Status  On-going      PT LONG TERM GOAL #4   Title  Pt's husband will be able to independently don pt's compression bandages to help pt reach maximal reduction    Status  Achieved      PT LONG TERM GOAL #5   Title  Pt will be able to independently verbalize lymphedema risk reduction practices    Status  Achieved      Additional Long Term Goals   Additional Long Term Goals  Yes      PT LONG TERM GOAL #6   Title  Pt will participate in aquatic therapy to help control lymphedema, decrease pain and increase endurance so she can continue in exercise program post discharge    Time  8    Period  Weeks    Status  New            Plan - 02/16/19 1837    Clinical Impression Statement  Pt comes in wearing her compressoin sleeve and glove that appear to be constricting at her wrist. She has an appointment to get both her sleeve and glove remeasured and adjusted with needed padding to control her lymphedema. She would like to begin aquatic therapy to help control her lymphedema but also to help decrease her back pain and improve her physical condition so that she can perform her activities of daily living easier and continue to do her job    Comorbidities  metastatic disease, hx of radiation, neuropathy    Stability/Clinical Decision Making  Evolving/Moderate complexity    Rehab Potential  Good    PT Frequency  2x / week    PT Duration  8 weeks    PT Treatment/Interventions  ADLs/Self Care Home Management;Passive range of motion;Manual techniques;Manual lymph drainage;Aquatic Therapy;Therapeutic exercise;Therapeutic activities;Taping;Compression bandaging;Scar mobilization    PT Next Visit Plan  continue to help with garments,  complete decongestive therapy and exercise add aquatic therapy    PT Home Exercise Plan  compression bandaging, self MLD and daily use of Flexitouch    Consulted and Agree with Plan of Care  Patient       Patient will benefit from skilled therapeutic intervention in order to improve the following deficits and impairments:  Postural dysfunction, Increased edema, Decreased knowledge of precautions  Visit Diagnosis: Postmastectomy lymphedema - Plan: PT plan of care cert/re-cert  Abnormal posture - Plan: PT plan of care cert/re-cert     Problem List Patient Active Problem List   Diagnosis Date Noted  . Goals of care, counseling/discussion 12/27/2018  . Osteonecrosis ([place]) 11/01/2018  . Pathological fracture in neoplastic disease, left femur, initial encounter for fracture ([place]) 01/28/2018  . Morbid obesity with BMI of 40.0-44.9, adult ([place]) 10/12/2017  . IUD complication ([place]) [id number]  . Bone metastases ([place]) 08/13/2017  . Pain from bone metastases ([place]) 08/13/2017  . Liver metastases ([place]) 08/13/2017  . Steatosis of liver 08/13/2017  . Bilateral breast cancer ([place]) 12/26/2015  . Malignant neoplasm of central portion of left breast in female, estrogen receptor positive ([place]) 11/28/2015  . Malignant neoplasm of upper-inner quadrant of right breast in female, estrogen receptor positive ([place]) 10/17/2015  . Hypertension 04/19/2012  . Hypothyroid 04/19/2012  . Fibroids 04/19/2012  .  Stress incontinence, female 04/19/2012   Donato Heinz. Owens Shark PT  Norwood Levo 02/16/2019, 6:51 PM  [place], [place], [address] Phone: [phone]   Fax:  [phone]  Name: Debbie [name] MRN: [id number] Date of Birth: June 18, 1965

## 2019-02-20 NOTE — Progress Notes (Signed)
[place]  [phone] [phone]) [phone]    ID: Debbie [name] DOB: [birth year]  MR#: [id number]  XOV#:[id number]  Patient Care Team: Kelton Pillar, MD as PCP - General (Family Medicine) Jacelyn Pi, MD as Consulting Physician (Endocrinology) Jovita Kussmaul, MD as Consulting Physician (General Surgery) [name], Virgie Dad, MD as Consulting Physician (Oncology) [name], MD as Attending Physician (Radiation Oncology) Belva Crome, MD as Consulting Physician (Cardiology) Crissie Reese, MD as Consulting Physician (Plastic Surgery) [name], [name], NP as Nurse Practitioner (Hematology and Oncology) Margarette Canada (Inactive) OTHER MD:   CHIEF COMPLAINT: Estrogen receptor positive stage IV  breast cancer (s/p bilateral mastectomies)  CURRENT TREATMENT:  Anastrozole, affinitor; denosumab/Xgeva   INTERVAL HISTORY: Debbie [name] returns today for follow-up and treatment of her estrogen receptor positive stage IV breast cancer.  She continues on anastrozole, with good tolerance.    She also continues on affinitor.  She is tolerating this well, with no adverse effects.  She is only using the dexamethasone mouth rinse as needed.    She also continues on denosumab/Xgeva every 28 days.  She notes that she had an infected bone in her mouth and underwent oral surgery on 01/06/2019.  She has been doing well since then, and tolerated her September xgeva just fine.  She denies any delayed healing in her mouth, or any other concerns.     REVIEW OF SYSTEMS: Debbie [name] is doing well today.  She has no new pain.  She is without fever, chills, chest pain, palpitations, cough, shortness of breath, bowel/bladder changes, nausea, vomiting, headaches, or any other concerns.  A detailed ROS was otherwise non contributory.     BREAST CANCER HISTORY: From the original intake note:  Debbie [name] had screening mammography with tomosynthesis at the [facility]  10/02/2015. This showed 2 possible masses in the right breast as well as some suspicious calcifications. The left breast was unremarkable. On 10/15/2015 she underwent right diagnostic mammography with tomography and right breast ultrasonography. The breast density was category B. Spot magnification views confirmed an area of pleomorphic calcifications measuring up to 5 cm in the upper half of the breast. Also in the upper outer right breast there were adjacent low-density masses without distortion or calcifications. On physical exam there was no palpable abnormality.  Targeted ultrasound of the right breast found the 2 "masses" in the right upper quadrant were benign cysts measuring 0.8 and 0.5 cm respectively. Biopsy of the area of calcification on 10/15/2015 showed ([id number]) invasive ductal carcinoma, E-cadherin positive, grade 2, estrogen receptor 90% positive, progesterone receptor 50% positive, both with strong staining intensity, with an MIB-1 of 70%, and no HER-2 amplification, the signals ratio being 1.18 and the number per cell 2.60.  Her subsequent history is as detailed below    PAST MEDICAL HISTORY: Past Medical History:  Diagnosis Date  . Anxiety   . Arthritis   . Bone metastasis ([place])   . Breast cancer of upper-inner quadrant of right female breast [facility] oncologist-  dr [name]--- 04/ 2019 ER/PR+ Stage IV w/ metastatic disease to liver, total spine, lung, bone   dx 06/ 2017  right breast invasive ductal carcinoma, Stage IIB, ER/PR + (pT2 pN1), grade 1-- s/p  right breast lumpecotmy w/ sln bx's and bilateral breast reduction 11-28-2015/  left breast with reduction , dx invasive ductal ca, Grade 1 (pT1cpNX) ER/PR positive/  adjuvant radiation completed 03-27-2016 to right chest wall, 2018  reclassifiec Stage IIA  . Cancer, metastatic to liver (  HCC)   . Chronic back pain   . Constipation   . Depression   . Family history of adverse reaction to anesthesia     mother has Copd was  kept on vent   . Headache   . Heart murmur   . History of hyperthyroidism    s/p  RAI  . History of radiation therapy 02/11/16- 03/27/16   Right Chest Wall 50.4 Gy in 28 fractions, Right Chest Wall Boost 10 Gy in 5 fractions.   . Hyperlipidemia   . Hypertension   . Hypothyroidism, postradioiodine therapy    endocrinoloigst-  dr balan--  2004  s/p  RAI  . Menorrhagia 2011  . Multiple pulmonary nodules    secondary  metastatic  . PONV (postoperative nausea and vomiting)    patch helps   . SUI (stress urinary incontinence, female)   . Wears contact lenses   . Wears dentures    full upper and lower partial    PAST SURGICAL HISTORY: Past Surgical History:  Procedure Laterality Date  . BREAST LUMPECTOMY WITH NEEDLE LOCALIZATION AND AXILLARY SENTINEL LYMPH NODE BX Right 11/28/2015   Procedure: RIGHT BREAST LUMPECTOMY WITH NEEDLE LOCALIZATION AND AXILLARY SENTINEL LYMPH NODE BX;  Surgeon: Autumn Messing III, MD;  Location: [place];  Service: General;  Laterality: Right;  . BREAST REDUCTION SURGERY Bilateral 11/28/2015   Procedure: MAMMARY REDUCTION  (BREAST) BILATERAL;  Surgeon: Crissie Reese, MD;  Location: [place];  Service: Plastics;  Laterality: Bilateral;  . CESAREAN SECTION  01/12/1993  . COLONOSCOPY    . Dental surgeries    . FEMUR IM NAIL Left 01/28/2018   Procedure: INTRAMEDULLARY (IM) NAIL FEMORAL;  Surgeon: Nicholes Stairs, MD;  Location: [place];  Service: Orthopedics;  Laterality: Left;  . HYSTEROSCOPY N/A 09/02/2017   Procedure: HYSTEROSCOPY  to remove IUD;  Surgeon: Eldred Manges, MD;  Location: [facility];  Service: Gynecology;  Laterality: N/A;  . Liver biospy    . MASTECTOMY    . MASTECTOMY W/ SENTINEL NODE BIOPSY Bilateral 12/26/2015   Procedure: BILATERAL MASTECTOMY WITH LEFT SENTINEL LYMPH NODE BIOPSY;  Surgeon: Autumn Messing III, MD;  Location: [place];  Service: General;  Laterality: Bilateral;  . Removal of IUD    . TUBAL LIGATION Bilateral 1995     FAMILY HISTORY: Family History  Problem Relation Age of Onset  . Diabetes Mother   . Hypertension Mother   . Hypertension Father   . Diabetes Maternal Grandmother   . Hypertension Sister   . Cancer Paternal Grandmother        colon  . Colon cancer Paternal Grandmother   . Esophageal cancer Neg Hx   . Stomach cancer Neg Hx   . Rectal cancer Neg Hx    The patient's parents are still living, in their early 12s as of June 2017. The patient has one brother, 2 sisters. On the maternal side there is a history of uterine and prostate cancer. On the paternal side there is a history of colon cancer and possibly uterine cancer. There is no history of breast or ovarian cancer in the family.    GYNECOLOGIC HISTORY:  No LMP recorded. (Menstrual status: IUD).  Menarche age 52. She still having regular periods. She is GX P1, first pregnancy age 27. She used oral contraceptives for a few years remotely, with no complications. She is currently on a Mirena IUD and also is status post bilateral tubal ligation.   SOCIAL HISTORY: (Updated 08/04/2018) Debbie [name]  works as a Technical sales engineer at [name]. Her husband Debbie [name] is a Administrator.  They are both taking appropriate precautions at work.  Their son [name], age 46, is a radio DJ.  He no longer lives with the patient.  The patient has no grandchildren. She is not a Ambulance person.   ADVANCED DIRECTIVES: Not in place   HEALTH MAINTENANCE: Social History   Tobacco Use  . Smoking status: Never Smoker  . Smokeless tobacco: Never Used  Substance Use Topics  . Alcohol use: No  . Drug use: No     Colonoscopy: January 2016   PAP: January 2016   Bone density: August 2017 showed a T score of +0.7 normal  Lipid panel:  Allergies  Allergen Reactions  . Ace Inhibitors Cough  . Atorvastatin Other (See Comments)    Joint pain  . Simvastatin Other (See Comments)    Joint pain    Current Outpatient Medications  Medication Sig Dispense Refill   . acetaminophen (TYLENOL) 325 MG tablet Take 325 mg by mouth every 6 (six) hours as needed for mild pain or headache.     Lenon Oms 10 MG tablet TAKE 1 TABLET BY MOUTH DAILY. 28 tablet 2  . anastrozole (ARIMIDEX) 1 MG tablet Take 1 tablet (1 mg total) by mouth daily. 90 tablet 4  . Calcium-Magnesium 250-125 MG TABS Take 1 tablet by mouth daily. 120 each 4  . cetirizine (ZYRTEC ALLERGY) 10 MG tablet Take 10 mg by mouth daily as needed for allergies.     . chlorhexidine (PERIDEX) 0.12 % solution     . dexamethasone (DECADRON) 0.5 MG/5ML solution Swish 34m in mouth for 229m and spit out. Use 4 times daily for 8 weeks, start with Afinitor. Avoid eating/drinking for 1hr after rinse. 500 mL 4  . docusate sodium (COLACE) 100 MG capsule Take 200-400 mg by mouth daily.    . Marland Kitchenoxycycline (VIBRA-TABS) 100 MG tablet Take 1 tablet (100 mg total) by mouth daily. 30 tablet 2  . Ferrous Sulfate (IRON) 325 (65 Fe) MG TABS Take 1 tablet (325 mg total) by mouth daily. 30 each 2  . fluconazole (DIFLUCAN) 100 MG tablet Take 1 tablet (100 mg total) by mouth daily. 5 tablet 0  . hydrocortisone cream 1 % Apply 1 application topically daily as needed (for rash).    . Marland Kitchenbuprofen (ADVIL,MOTRIN) 600 MG tablet Take 1 tablet (600 mg total) by mouth every 6 (six) hours as needed for moderate pain. 60 tablet 0  . levothyroxine (SYNTHROID, LEVOTHROID) 137 MCG tablet Take 137 mcg by mouth daily before breakfast.     . LIVALO 2 MG TABS Take 2 mg by mouth daily.   6  . LORazepam (ATIVAN) 0.5 MG tablet Take 1 tablet (0.5 mg total) by mouth at bedtime as needed for anxiety. 30 tablet 0  . losartan (COZAAR) 100 MG tablet Take 100 mg by mouth every morning.     . Multiple Vitamin (MULTIVITAMIN) tablet Take 1 tablet by mouth daily.    . Marland Kitchenmega-3 acid ethyl esters (LOVAZA) 1 g capsule Take 1 g by mouth 2 (two) times daily.     . potassium chloride SA (K-DUR,KLOR-CON) 20 MEQ tablet Take 40 mEq by mouth daily.     . pregabalin (LYRICA)  100 MG capsule TAKE 1 CAPSULE BY MOUTH 3 TIMES DAILY 90 capsule 2  . prochlorperazine (COMPAZINE) 10 MG tablet Take 1 tablet (10 mg total) by mouth every 6 (six) hours as needed  for nausea or vomiting. 30 tablet 0  . triamterene-hydrochlorothiazide (DYAZIDE) 37.5-25 MG capsule Take 1 each (1 capsule total) by mouth daily. 90 capsule 4  . Vitamin D, Cholecalciferol, 1000 units CAPS Take 1 tablet by mouth daily. (Patient taking differently: Take 1,000 Units by mouth daily. ) 90 capsule 4   No current facility-administered medications for this visit.     OBJECTIVE: Morbidly obese African-American woman who appears stated age Vitals:   02/21/19 0840  BP: (!) 156/77  Pulse: 87  Resp: 18  Temp: 98.7 F (37.1 C)     Body mass index is 42.88 kg/m.    ECOG FS:1 - Symptomatic but completely ambulatory  Filed Weights   02/21/19 0840  Weight: 269 lb 11.2 oz (122.3 kg)   GENERAL: Patient is a well appearing female in no acute distress HEENT:  Sclerae anicteric.  Oropharynx clear and moist. No ulcerations or evidence of oropharyngeal candidiasis. Neck is supple. Left posterior gum line healing well.    NODES:  No cervical, supraclavicular, or axillary lymphadenopathy palpated.  BREAST EXAM:  S/p bilateral mastectomies no sign of local recurrence LUNGS:  Clear to auscultation bilaterally.  No wheezes or rhonchi. HEART:  Regular rate and rhythm. No murmur appreciated. ABDOMEN:  Soft, nontender.  Positive, normoactive bowel sounds. No organomegaly palpated. MSK:  No focal spinal tenderness to palpation. Full range of motion bilaterally in the upper extremities. EXTREMITIES:  No peripheral edema.   SKIN:  Clear with no obvious rashes or skin changes. No nail dyscrasia. NEURO:  Nonfocal. Well oriented.  Appropriate affect.     LAB RESULTS:  CMP     Component Value Date/Time   NA 144 02/21/2019 0823   NA 140 07/08/2016 0948   K 4.5 02/21/2019 0823   K 4.1 07/08/2016 0948   CL 107 02/21/2019  0823   CO2 27 02/21/2019 0823   CO2 24 07/08/2016 0948   GLUCOSE 134 (H) 02/21/2019 0823   GLUCOSE 89 07/08/2016 0948   BUN 11 02/21/2019 0823   BUN 11.9 07/08/2016 0948   CREATININE 0.94 02/21/2019 0823   CREATININE 0.93 11/01/2018 0742   CREATININE 0.9 07/08/2016 0948   CALCIUM 9.2 02/21/2019 0823   CALCIUM 10.0 07/08/2016 0948   PROT 7.0 02/21/2019 0823   PROT 7.6 07/08/2016 0948   ALBUMIN 3.2 (L) 02/21/2019 0823   ALBUMIN 3.6 07/08/2016 0948   AST 30 02/21/2019 0823   AST 29 11/01/2018 0742   AST 22 07/08/2016 0948   ALT 34 02/21/2019 0823   ALT 35 11/01/2018 0742   ALT 27 07/08/2016 0948   ALKPHOS 145 (H) 02/21/2019 0823   ALKPHOS 115 07/08/2016 0948   BILITOT 0.3 02/21/2019 0823   BILITOT 0.3 11/01/2018 0742   BILITOT 0.35 07/08/2016 0948   GFRNONAA >60 02/21/2019 0823   GFRNONAA >60 11/01/2018 0742   GFRAA >60 02/21/2019 0823   GFRAA >60 11/01/2018 0742    INo results found for: SPEP, UPEP  Lab Results  Component Value Date   WBC 4.3 02/21/2019   NEUTROABS 3.4 02/21/2019   HGB 11.5 (L) 02/21/2019   HCT 35.5 (L) 02/21/2019   MCV 86.2 02/21/2019   PLT 300 02/21/2019      Chemistry      Component Value Date/Time   NA 144 02/21/2019 0823   NA 140 07/08/2016 0948   K 4.5 02/21/2019 0823   K 4.1 07/08/2016 0948   CL 107 02/21/2019 0823   CO2 27 02/21/2019 0823   CO2  24 07/08/2016 0948   BUN 11 02/21/2019 0823   BUN 11.9 07/08/2016 0948   CREATININE 0.94 02/21/2019 0823   CREATININE 0.93 11/01/2018 0742   CREATININE 0.9 07/08/2016 0948      Component Value Date/Time   CALCIUM 9.2 02/21/2019 0823   CALCIUM 10.0 07/08/2016 0948   ALKPHOS 145 (H) 02/21/2019 0823   ALKPHOS 115 07/08/2016 0948   AST 30 02/21/2019 0823   AST 29 11/01/2018 0742   AST 22 07/08/2016 0948   ALT 34 02/21/2019 0823   ALT 35 11/01/2018 0742   ALT 27 07/08/2016 0948   BILITOT 0.3 02/21/2019 0823   BILITOT 0.3 11/01/2018 0742   BILITOT 0.35 07/08/2016 0948       No  results found for: LABCA2  No components found for: [id number]  No results for input(s): INR in the last 168 hours.  Urinalysis No results found for: COLORURINE, APPEARANCEUR, LABSPEC, PHURINE, GLUCOSEU, HGBUR, BILIRUBINUR, KETONESUR, PROTEINUR, UROBILINOGEN, NITRITE, LEUKOCYTESUR   STUDIES: No results found.   ELIGIBLE FOR AVAILABLE RESEARCH PROTOCOL: PALLAS, but inelegible because of Mirena IUD   ASSESSMENT: 53 y.o.  woman status post right breast upper inner quadrant biopsy 10/15/2015 for a clinical T2-T3 N0, stage II invasive ductal carcinoma, estrogen and progesterone receptor positive, HER-2 nonamplified, with an Mib-1 of 70%  (1) status post right lumpectomy and sentinel lymph node sampling with oncoplastic breast reduction 11/28/2015 for a pT2 pN1, stage IIB invasive ductal carcinoma, grade 1, with negative margins (1/1 sentinel node positive)  (a) reclassified as stage IIA in the 2018 prognostic revision   (2) left reduction mammoplasty 11/28/2015 unexpectedly found a pT1c pNX invasive ductal carcinoma, grade 1, estrogen and progesterone receptor positive, HER-2 not amplified, with an MIB-1 of 3%  (3) Mammaprint from the right sided breast cancer was "low risk", predicting a 98% chance of no disease recurrence within 5 years with anti-estrogens as the only systemic therapy. It also predicts minimal to no benefit from chemotherapy  (4) status post bilateral mastectomies with bilateral sentinel lymph node sampling 12/26/2015, showing  (a) on the right, no residual carcinoma (0/3 nodes involved)  (b) on the left, focal ductal carcinoma in situ, 0.2 cm, with negative margins; (0/4 nodes involved)  (5) adjuvant radiation 02/11/16 - 03/27/16    1) Right chest wall: 50.4 Gy in 28 fractions               2) Right chest wall boost: 10 Gy in 5 fractions   (6) started on tamoxifen neoadjuvantly 10/23/2015 to allow for expected delays in definitive local treatment  (a) Mirena  IUD in place  (b) tamoxifen discontinued 08/13/2017, with progression  (7) since she had 4 lymph nodes removed from each axilla but also receive radiation on the right, lab draws should preferentially be obtained from the left arm   METASTATIC DISEASE: April 2019 (8) CT scan of the abdomen and pelvis 08/04/2017, MRI of the liver and total spine MRI 08/11/2017 and CT scan of the chest and brain on 08/13/2017 showed liver and bone metastases  (a) liver biopsy 09/07/2017 confirms estrogen receptor positive, progesterone receptor negative, HER-2 negative metastatic breast cancer  (b) chest CT scan 08/13/2017 shows multiple bilateral pulmonary nodules.  (c) CT of the brain with and without contrast 08/26/2017 shows no evidence of metastatic disease to the brain  (d) CA 27-29 is informative  (9) fulvestrant started 08/17/2017  (a) palbociclib started 08/31/2017  (b) fulvestrant and palbociclib discontinued 01/04/2018 with evidence of progression in  the liver  (10) denosumab/Xgeva started 09/14/2017  (a) held after 03/01/2018 dose because concern re. osteonecrosis  (b) resumed 06/09/2018  (c) Held starting 02/21/2019 due to oral surgery  (11) anastrozole started 01/04/2018  (a) everolimus started 01/29/2018  (b) liver MRI and bone scan December 2019 show stable disease  (c) chest CT scan on 08/02/2018 shows lung lesions resolved, bone lesions stable  (d) bone scan 09/13/2018 essentially stable  (e) liver MRI 12/08/2018 shows improvement in hepatic metastsases  (12) 01/28/18 prophylactic left femur intramedullary nail surgery for impending left femur pathologic fracture  (a) palliative radiation with Dr. [name] to the left femur and left ilium from 02/16/2018 to 03/01/2018  (13) [facility] report on liver biopsy from April 2019 shows a negative PDL 1, stable MSI and proficient mismatch repair status.  The tumor is positive for the androgen receptor, and for PTEN for PIK3 CA mutations.  HER-2/neu was  read as equivocal (it was negative by FISH on the pathology report).   PLAN: Cambree is doing well today.  She continues to tolerate the Anastrozole and Everolimus well and will continue this.  She has no clinical signs of progression.  She is using the mouth rinses as needed and has had no complications.  Her labs are stable and I reviewed them with her in detail.    She does have the issue of the having an oral surgery in August.  Though she is healing well, we really should hold off on giving her any further Xgeva to avoid any complications from the treatment.  I asked that she get a letter from her oral surgeon when she sees them clearing her to restart Xgeva.  She understands this and is in agreement.  Owen and I talked about healthy diet and exercise today in detail.    Fabianna will return in 4 weeks for labs, f/u, and perhaps Xgeva.  She is due for restaging in December.  She was recommended to continue with the appropriate pandemic precautions. She knows to call for any questions that may arise between now and her next appointment.  We are happy to see her sooner if needed.  A total of (30) minutes of face-to-face time was spent with this patient with greater than 50% of that time in counseling and care-coordination.   Wilber Bihari, NP 02/24/19 8:52 AM Medical Oncology and Hematology [facility] [address] [place],  [address] Tel. [phone]    Fax. [phone]

## 2019-02-21 ENCOUNTER — Inpatient Hospital Stay (HOSPITAL_BASED_OUTPATIENT_CLINIC_OR_DEPARTMENT_OTHER): Payer: 59 | Admitting: Adult Health

## 2019-02-21 ENCOUNTER — Inpatient Hospital Stay: Payer: 59

## 2019-02-21 ENCOUNTER — Other Ambulatory Visit: Payer: Self-pay

## 2019-02-21 ENCOUNTER — Encounter: Payer: Self-pay | Admitting: Adult Health

## 2019-02-21 ENCOUNTER — Inpatient Hospital Stay: Payer: 59 | Attending: Oncology

## 2019-02-21 ENCOUNTER — Ambulatory Visit: Payer: 59 | Admitting: Physical Therapy

## 2019-02-21 VITALS — BP 156/77 | HR 87 | Temp 98.7°F | Resp 18 | Ht 66.5 in | Wt 269.7 lb

## 2019-02-21 DIAGNOSIS — Z853 Personal history of malignant neoplasm of breast: Secondary | ICD-10-CM | POA: Diagnosis not present

## 2019-02-21 DIAGNOSIS — C50012 Malignant neoplasm of nipple and areola, left female breast: Secondary | ICD-10-CM

## 2019-02-21 DIAGNOSIS — I972 Postmastectomy lymphedema syndrome: Secondary | ICD-10-CM | POA: Diagnosis not present

## 2019-02-21 DIAGNOSIS — C50211 Malignant neoplasm of upper-inner quadrant of right female breast: Secondary | ICD-10-CM

## 2019-02-21 DIAGNOSIS — C50112 Malignant neoplasm of central portion of left female breast: Secondary | ICD-10-CM

## 2019-02-21 DIAGNOSIS — Z9013 Acquired absence of bilateral breasts and nipples: Secondary | ICD-10-CM | POA: Insufficient documentation

## 2019-02-21 DIAGNOSIS — R293 Abnormal posture: Secondary | ICD-10-CM | POA: Diagnosis not present

## 2019-02-21 DIAGNOSIS — Z170 Estrogen receptor positive status [ER+]: Secondary | ICD-10-CM

## 2019-02-21 DIAGNOSIS — C787 Secondary malignant neoplasm of liver and intrahepatic bile duct: Secondary | ICD-10-CM

## 2019-02-21 DIAGNOSIS — C7951 Secondary malignant neoplasm of bone: Secondary | ICD-10-CM | POA: Insufficient documentation

## 2019-02-21 DIAGNOSIS — M6281 Muscle weakness (generalized): Secondary | ICD-10-CM | POA: Diagnosis not present

## 2019-02-21 DIAGNOSIS — Z923 Personal history of irradiation: Secondary | ICD-10-CM | POA: Diagnosis not present

## 2019-02-21 DIAGNOSIS — Z79811 Long term (current) use of aromatase inhibitors: Secondary | ICD-10-CM | POA: Insufficient documentation

## 2019-02-21 DIAGNOSIS — C50011 Malignant neoplasm of nipple and areola, right female breast: Secondary | ICD-10-CM

## 2019-02-21 LAB — COMPREHENSIVE METABOLIC PANEL
ALT: 34 U/L (ref 0–44)
AST: 30 U/L (ref 15–41)
Albumin: 3.2 g/dL — ABNORMAL LOW (ref 3.5–5.0)
Alkaline Phosphatase: 145 U/L — ABNORMAL HIGH (ref 38–126)
Anion gap: 10 (ref 5–15)
BUN: 11 mg/dL (ref 6–20)
CO2: 27 mmol/L (ref 22–32)
Calcium: 9.2 mg/dL (ref 8.9–10.3)
Chloride: 107 mmol/L (ref 98–111)
Creatinine, Ser: 0.94 mg/dL (ref 0.44–1.00)
GFR calc Af Amer: >60 mL/min (ref >60)
GFR calc non Af Amer: >60 mL/min (ref >60)
Glucose, Bld: 134 mg/dL — ABNORMAL HIGH (ref 70–99)
Potassium: 4.5 mmol/L (ref 3.5–5.1)
Sodium: 144 mmol/L (ref 135–145)
Total Bilirubin: 0.3 mg/dL (ref 0.3–1.2)
Total Protein: 7 g/dL (ref 6.5–8.1)

## 2019-02-21 LAB — CBC WITH DIFFERENTIAL/PLATELET
Abs Immature Granulocytes: 0.04 10*3/uL (ref 0.00–0.07)
Basophils Absolute: 0 10*3/uL (ref 0.0–0.1)
Basophils Relative: 0 %
Eosinophils Absolute: 0.1 10*3/uL (ref 0.0–0.5)
Eosinophils Relative: 3 %
HCT: 35.5 % — ABNORMAL LOW (ref 36.0–46.0)
Hemoglobin: 11.5 g/dL — ABNORMAL LOW (ref 12.0–15.0)
Immature Granulocytes: 1 %
Lymphocytes Relative: 9 %
Lymphs Abs: 0.4 10*3/uL — ABNORMAL LOW (ref 0.7–4.0)
MCH: 27.9 pg (ref 26.0–34.0)
MCHC: 32.4 g/dL (ref 30.0–36.0)
MCV: 86.2 fL (ref 80.0–100.0)
Monocytes Absolute: 0.4 10*3/uL (ref 0.1–1.0)
Monocytes Relative: 9 %
Neutro Abs: 3.4 10*3/uL (ref 1.7–7.7)
Neutrophils Relative %: 78 %
Platelets: 300 10*3/uL (ref 150–400)
RBC: 4.12 MIL/uL (ref 3.87–5.11)
RDW: 15 % (ref 11.5–15.5)
WBC: 4.3 10*3/uL (ref 4.0–10.5)
nRBC: 0 % (ref 0.0–0.2)

## 2019-02-21 MED FILL — LIVALO 2 MG TABLET: 2 | 90 days supply | Qty: 90 | Fill #3

## 2019-02-21 NOTE — Therapy (Signed)
[place], [place], [address] Phone: [phone]   Fax:  [phone]  Physical Therapy Treatment  Patient Details  Name: Debbie [name] MRN: [id number] Date of Birth: [birth year] Referring Provider (PT): Dr. Marlou Starks   Encounter Date: 02/21/2019  PT End of Session - 02/21/19 1343    Visit Number  18    Number of Visits  41    Date for PT Re-Evaluation  04/19/19    PT Start Time  1300    PT Stop Time  1340    PT Time Calculation (min)  40 min    Activity Tolerance  Patient tolerated treatment well    Behavior During Therapy  [facility] for tasks assessed/performed       Past Medical History:  Diagnosis Date  . Anxiety   . Arthritis   . Bone metastasis ([place])   . Breast cancer of upper-inner quadrant of right female breast [facility]) oncologist-  dr [name]--- 04/ 2019 ER/PR+ Stage IV w/ metastatic disease to liver, total spine, lung, bone   dx 06/ 2017  right breast invasive ductal carcinoma, Stage IIB, ER/PR + (pT2 pN1), grade 1-- s/p  right breast lumpecotmy w/ sln bx's and bilateral breast reduction 11-28-2015/  left breast with reduction , dx invasive ductal ca, Grade 1 (pT1cpNX) ER/PR positive/  adjuvant radiation completed 03-27-2016 to right chest wall, 2018  reclassifiec Stage IIA  . Cancer, metastatic to liver ([place])   . Chronic back pain   . Constipation   . Depression   . Family history of adverse reaction to anesthesia     mother has Copd was kept on vent   . Headache   . Heart murmur   . History of hyperthyroidism    s/p  RAI  . History of radiation therapy 02/11/16- 03/27/16   Right Chest Wall 50.4 Gy in 28 fractions, Right Chest Wall Boost 10 Gy in 5 fractions.   . Hyperlipidemia   . Hypertension   . Hypothyroidism, postradioiodine therapy    endocrinoloigst-  dr balan--  2004  s/p  RAI  . Menorrhagia 2011  . Multiple pulmonary nodules    secondary  metastatic  . PONV (postoperative  nausea and vomiting)    patch helps   . SUI (stress urinary incontinence, female)   . Wears contact lenses   . Wears dentures    full upper and lower partial    Past Surgical History:  Procedure Laterality Date  . BREAST LUMPECTOMY WITH NEEDLE LOCALIZATION AND AXILLARY SENTINEL LYMPH NODE BX Right 11/28/2015   Procedure: RIGHT BREAST LUMPECTOMY WITH NEEDLE LOCALIZATION AND AXILLARY SENTINEL LYMPH NODE BX;  Surgeon: Autumn Messing III, MD;  Location: [place];  Service: General;  Laterality: Right;  . BREAST REDUCTION SURGERY Bilateral 11/28/2015   Procedure: MAMMARY REDUCTION  (BREAST) BILATERAL;  Surgeon: Crissie Reese, MD;  Location: [place];  Service: Plastics;  Laterality: Bilateral;  . CESAREAN SECTION  01/12/1993  . COLONOSCOPY    . Dental surgeries    . FEMUR IM NAIL Left 01/28/2018   Procedure: INTRAMEDULLARY (IM) NAIL FEMORAL;  Surgeon: Nicholes Stairs, MD;  Location: [place];  Service: Orthopedics;  Laterality: Left;  . HYSTEROSCOPY N/A 09/02/2017   Procedure: HYSTEROSCOPY  to remove IUD;  Surgeon: Eldred Manges, MD;  Location: [facility];  Service: Gynecology;  Laterality: N/A;  . Liver biospy    . MASTECTOMY    . MASTECTOMY W/ SENTINEL NODE BIOPSY Bilateral 12/26/2015  Procedure: BILATERAL MASTECTOMY WITH LEFT SENTINEL LYMPH NODE BIOPSY;  Surgeon: Autumn Messing III, MD;  Location: [place];  Service: General;  Laterality: Bilateral;  . Removal of IUD    . TUBAL LIGATION Bilateral 1995    There were no vitals filed for this visit.  Subjective Assessment - 02/21/19 1339    Subjective  Pt got remeasured to have her garment remade.  She has figured out a system of wearing her daytime garment and bandaging to keep her swelling under control    Pertinent History  11/26/15- right lumpectomy, SLNB, and breast reduction and sent tissue from left breast for biopsy, 12/26/15- bilateral mastectomy and SLNB on L, pt completed radiation 08/12/17- pt diagnosed with stage 4 breast cancer  in bone, liver and lung, pt is currently undergoing chemotherapy, Oct 2019- pt had to undergo L medullary femur nailing due to impending pathologic fracture from bone mets- completed PT for 2 weeks, 2 weeks of radiation to femur, pt developed neuropathy of bilateral LEs in Dec 2019    Patient Stated Goals  to get swelling down in arm and get it controlled    Currently in Pain?  No/denies                  Outpatient Rehab from [facility]/08/[facility]  Lymphedema Life Impact Scale Total Score  11.76 %           [facility] Adult PT Treatment/Exercise - 02/21/19 0001      Manual Therapy   Edema Management  helped pt reapply her sleeve and glove at the end of session     Manual Lymphatic Drainage (MLD)  In Supine: Short neck, Rt  right chest and lateral chest at axilla inguinal nodes, Rt axillo-inguinal anastomosis, then Rt UE working from lateral upper arm to dorsal hand proximally to distal then retracing all steps. then to left sidelying for axilla and lateral chest to back    extra time spent on hand and forearm                  PT Long Term Goals - 02/21/19 1341      PT LONG TERM GOAL #1   Title  Pt will demonstrate maximal reduction of edema in RUE as evidenced by stabilization of circumferential measurements    Period  Weeks    Status  On-going      PT LONG TERM GOAL #2   Title  Pt will obtain appropriate compression garments for long term management of RUE lymphedema    Status  On-going      PT LONG TERM GOAL #3   Title  Pt will be independent in self MLD for long term management of lymphedema    Status  On-going      PT LONG TERM GOAL #4   Title  Pt's husband will be able to independently don pt's compression bandages to help pt reach maximal reduction    Status  Achieved      PT LONG TERM GOAL #5   Title  Pt will be able to independently verbalize lymphedema risk reduction practices    Status  Achieved       Additional Long Term Goals   Additional Long Term Goals  Yes      PT LONG TERM GOAL #7   Title  Pt will be independent in a home exercise program to increase funtional strength and endurance    Time  8  Period  Weeks    Status  New            Plan - 02/21/19 1344    Clinical Impression Statement  Pt comes in today wearing her sleeve and glove. While her lymphedema is still visible and evident, it appears to be better under control at her wrist and hand.  She says her symptoms fluctuate and today is a good day. She would be interested in doing exrcises here on the days that her lymphedema is under control    Personal Factors and Comorbidities  Comorbidity 1;Comorbidity 2    Comorbidities  metastatic disease, hx of radiation, neuropathy    Stability/Clinical Decision Making  Evolving/Moderate complexity    PT Frequency  2x / week    PT Duration  8 weeks    PT Treatment/Interventions  ADLs/Self Care Home Management;Passive range of motion;Manual techniques;Manual lymph drainage;Aquatic Therapy;Therapeutic exercise;Therapeutic activities;Taping;Compression bandaging;Scar mobilization    PT Next Visit Plan  If lymphedema is under control, add exercises to UE, core and LE and teach home program.   Otherwise, continue to help with garments, complete decongestive therapy and exercise add aquatic therapy    PT Home Exercise Plan  compression bandaging, self MLD and daily use of Flexitouch    Consulted and Agree with Plan of Care  Patient       Patient will benefit from skilled therapeutic intervention in order to improve the following deficits and impairments:  Postural dysfunction, Increased edema, Decreased knowledge of precautions  Visit Diagnosis: Postmastectomy lymphedema  Abnormal posture     Problem List Patient Active Problem List   Diagnosis Date Noted  . Goals of care, counseling/discussion 12/27/2018  . Osteonecrosis ([place]) 11/01/2018  . Pathological fracture in  neoplastic disease, left femur, initial encounter for fracture ([place]) 01/28/2018  . Morbid obesity with BMI of 40.0-44.9, adult ([place]) 10/12/2017  . IUD complication ([place]) 123XX123  . Bone metastases ([place]) 08/13/2017  . Pain from bone metastases ([place]) 08/13/2017  . Liver metastases ([place]) 08/13/2017  . Steatosis of liver 08/13/2017  . Bilateral breast cancer ([place]) 12/26/2015  . Malignant neoplasm of central portion of left breast in female, estrogen receptor positive ([place]) 11/28/2015  . Malignant neoplasm of upper-inner quadrant of right breast in female, estrogen receptor positive ([place]) 10/17/2015  . Hypertension 04/19/2012  . Hypothyroid 04/19/2012  . Fibroids 04/19/2012  . Stress incontinence, female 04/19/2012   Donato Heinz. Owens Shark PT  Norwood Levo 02/21/2019, 1:48 PM  [place], [place], [address] Phone: [phone]   Fax:  [phone]  Name: Debbie [name] MRN: [id number] Date of Birth: November 26, 1965

## 2019-02-22 LAB — CANCER ANTIGEN 27.29: CA 27.29: 41.2 U/mL — ABNORMAL HIGH (ref 0.0–38.6)

## 2019-02-23 ENCOUNTER — Ambulatory Visit: Payer: 59 | Admitting: Physical Therapy

## 2019-02-23 ENCOUNTER — Other Ambulatory Visit: Payer: Self-pay

## 2019-02-23 ENCOUNTER — Encounter: Payer: Self-pay | Admitting: Physical Therapy

## 2019-02-23 DIAGNOSIS — Z923 Personal history of irradiation: Secondary | ICD-10-CM | POA: Diagnosis not present

## 2019-02-23 DIAGNOSIS — Z853 Personal history of malignant neoplasm of breast: Secondary | ICD-10-CM | POA: Diagnosis not present

## 2019-02-23 DIAGNOSIS — R293 Abnormal posture: Secondary | ICD-10-CM | POA: Diagnosis not present

## 2019-02-23 DIAGNOSIS — Z170 Estrogen receptor positive status [ER+]: Secondary | ICD-10-CM | POA: Diagnosis not present

## 2019-02-23 DIAGNOSIS — I972 Postmastectomy lymphedema syndrome: Secondary | ICD-10-CM

## 2019-02-23 DIAGNOSIS — C787 Secondary malignant neoplasm of liver and intrahepatic bile duct: Secondary | ICD-10-CM | POA: Diagnosis not present

## 2019-02-23 DIAGNOSIS — C7951 Secondary malignant neoplasm of bone: Secondary | ICD-10-CM | POA: Diagnosis not present

## 2019-02-23 DIAGNOSIS — M6281 Muscle weakness (generalized): Secondary | ICD-10-CM

## 2019-02-23 DIAGNOSIS — Z9013 Acquired absence of bilateral breasts and nipples: Secondary | ICD-10-CM | POA: Diagnosis not present

## 2019-02-23 NOTE — Patient Instructions (Signed)

## 2019-02-23 NOTE — Therapy (Signed)
[place], [place], [address] Phone: [phone]   Fax:  [phone]  Physical Therapy Treatment  Patient Details  Name: Debbie [name] MRN: [id number] Date of Birth: Oct 04, 1965 Referring Provider (PT): Dr. Marlou Starks   Encounter Date: 02/23/2019  PT End of Session - 02/23/19 1349    Visit Number  19    Number of Visits  41    Date for PT Re-Evaluation  04/19/19    PT Start Time  1300    PT Stop Time  1342    PT Time Calculation (min)  42 min    Activity Tolerance  Patient tolerated treatment well    Behavior During Therapy  [facility] for tasks assessed/performed       Past Medical History:  Diagnosis Date  . Anxiety   . Arthritis   . Bone metastasis ([place])   . Breast cancer of upper-inner quadrant of right female breast [facility]) oncologist-  dr [name]--- 04/ 2019 ER/PR+ Stage IV w/ metastatic disease to liver, total spine, lung, bone   dx 06/ 2017  right breast invasive ductal carcinoma, Stage IIB, ER/PR + (pT2 pN1), grade 1-- s/p  right breast lumpecotmy w/ sln bx's and bilateral breast reduction 11-28-2015/  left breast with reduction , dx invasive ductal ca, Grade 1 (pT1cpNX) ER/PR positive/  adjuvant radiation completed 03-27-2016 to right chest wall, 2018  reclassifiec Stage IIA  . Cancer, metastatic to liver ([place])   . Chronic back pain   . Constipation   . Depression   . Family history of adverse reaction to anesthesia     mother has Copd was kept on vent   . Headache   . Heart murmur   . History of hyperthyroidism    s/p  RAI  . History of radiation therapy 02/11/16- 03/27/16   Right Chest Wall 50.4 Gy in 28 fractions, Right Chest Wall Boost 10 Gy in 5 fractions.   . Hyperlipidemia   . Hypertension   . Hypothyroidism, postradioiodine therapy    endocrinoloigst-  dr balan--  2004  s/p  RAI  . Menorrhagia 2011  . Multiple pulmonary nodules    secondary  metastatic  . PONV (postoperative  nausea and vomiting)    patch helps   . SUI (stress urinary incontinence, female)   . Wears contact lenses   . Wears dentures    full upper and lower partial    Past Surgical History:  Procedure Laterality Date  . BREAST LUMPECTOMY WITH NEEDLE LOCALIZATION AND AXILLARY SENTINEL LYMPH NODE BX Right 11/28/2015   Procedure: RIGHT BREAST LUMPECTOMY WITH NEEDLE LOCALIZATION AND AXILLARY SENTINEL LYMPH NODE BX;  Surgeon: Autumn Messing III, MD;  Location: [place];  Service: General;  Laterality: Right;  . BREAST REDUCTION SURGERY Bilateral 11/28/2015   Procedure: MAMMARY REDUCTION  (BREAST) BILATERAL;  Surgeon: Crissie Reese, MD;  Location: [place];  Service: Plastics;  Laterality: Bilateral;  . CESAREAN SECTION  01/12/1993  . COLONOSCOPY    . Dental surgeries    . FEMUR IM NAIL Left 01/28/2018   Procedure: INTRAMEDULLARY (IM) NAIL FEMORAL;  Surgeon: Nicholes Stairs, MD;  Location: [place];  Service: Orthopedics;  Laterality: Left;  . HYSTEROSCOPY N/A 09/02/2017   Procedure: HYSTEROSCOPY  to remove IUD;  Surgeon: Eldred Manges, MD;  Location: [facility];  Service: Gynecology;  Laterality: N/A;  . Liver biospy    . MASTECTOMY    . MASTECTOMY W/ SENTINEL NODE BIOPSY Bilateral 12/26/2015  Procedure: BILATERAL MASTECTOMY WITH LEFT SENTINEL LYMPH NODE BIOPSY;  Surgeon: Autumn Messing III, MD;  Location: [place];  Service: General;  Laterality: Bilateral;  . Removal of IUD    . TUBAL LIGATION Bilateral 1995    There were no vitals filed for this visit.  Subjective Assessment - 02/23/19 1309    Subjective  Pt says she worked today for 4 hours and her arm is doing ok.  She is ready to start execising and willing to try kinesiotape on  her hand    Pertinent History  11/26/15- right lumpectomy, SLNB, and breast reduction and sent tissue from left breast for biopsy, 12/26/15- bilateral mastectomy and SLNB on L, pt completed radiation 08/12/17- pt diagnosed with stage 4 breast cancer in bone, liver  and lung, pt is currently undergoing chemotherapy, Oct 2019- pt had to undergo L medullary femur nailing due to impending pathologic fracture from bone mets- completed PT for 2 weeks, 2 weeks of radiation to femur, pt developed neuropathy of bilateral LEs in Dec 2019                  Outpatient Rehab from 12/08/2018 in [place]  Lymphedema Life Impact Scale Total Score  11.76 %           [facility] Adult PT Treatment/Exercise - 02/23/19 0001      Exercises   Exercises  Neck;Shoulder;Lumbar;Knee/Hip;Ankle      Neck Exercises: Seated   Other Seated Exercise  neck and upper thoracic range of motion    encouraged deep breathing      Lumbar Exercises: Supine   Pelvic Tilt  10 reps    Clam  10 reps    Heel Slides  10 reps    Bent Knee Raise  10 reps    Other Supine Lumbar Exercises  all with verbal and tactile cues for core stabalization       Knee/Hip Exercises: Standing   Lateral Step Up  Right;Left;10 reps;Step Height: 2"    Forward Step Up  Right;Left;10 reps;Step Height: 2"    Forward Step Up Limitations  pt did not have to hold on to any support.  cues to stand tall     Wall Squat  10 reps      Shoulder Exercises: Standing   Row  Strengthening;Right;Left;10 reps    Row Limitations  2 sets of 10  with both arms and 2 sets alternating       Shoulder Exercises: Pulleys   Flexion  2 minutes      Shoulder Exercises: Therapy Ball   Other Therapy Ball Exercises  press into ball and small circles       Manual Therapy   Manual Therapy  Taping    Kinesiotex  Edema      Kinesiotix   Edema  2 short pieces of kinesiotape applies to back of hand in crossed pattern                   PT Long Term Goals - 02/21/19 1341      PT LONG TERM GOAL #1   Title  Pt will demonstrate maximal reduction of edema in RUE as evidenced by stabilization of circumferential measurements    Period  Weeks    Status  On-going      PT LONG TERM  GOAL #2   Title  Pt will obtain appropriate compression garments for long term management of RUE lymphedema    Status  On-going  PT LONG TERM GOAL #3   Title  Pt will be independent in self MLD for long term management of lymphedema    Status  On-going      PT LONG TERM GOAL #4   Title  Pt's husband will be able to independently don pt's compression bandages to help pt reach maximal reduction    Status  Achieved      PT LONG TERM GOAL #5   Title  Pt will be able to independently verbalize lymphedema risk reduction practices    Status  Achieved      Additional Long Term Goals   Additional Long Term Goals  Yes      PT LONG TERM GOAL #7   Title  Pt will be independent in a home exercise program to increase funtional strength and endurance    Time  8    Period  Weeks    Status  New            Plan - 02/23/19 1349    Clinical Impression Statement  Pt is doing better.  She comes in wearing sleeve and glove Hand appears to be full  in glove so kinesiotape was applied to try to help reduce hand  She was able to upgrade to UE, LE and core exercise today    Comorbidities  metastatic disease, hx of radiation, neuropathy    PT Treatment/Interventions  ADLs/Self Care Home Management;Passive range of motion;Manual techniques;Manual lymph drainage;Aquatic Therapy;Therapeutic exercise;Therapeutic activities;Taping;Compression bandaging;Scar mobilization    PT Next Visit Plan  If lymphedema is under control, cont exercises to UE, core and LE and teach home program.   Otherwise, continue to help with garments, complete decongestive therapy and exercise add aquatic therapy    Consulted and Agree with Plan of Care  Patient       Patient will benefit from skilled therapeutic intervention in order to improve the following deficits and impairments:  Postural dysfunction, Increased edema, Decreased knowledge of precautions  Visit Diagnosis: Postmastectomy lymphedema  Abnormal  posture  Muscle weakness (generalized)     Problem List Patient Active Problem List   Diagnosis Date Noted  . Goals of care, counseling/discussion 12/27/2018  . Osteonecrosis ([place]) 11/01/2018  . Pathological fracture in neoplastic disease, left femur, initial encounter for fracture ([place]) 01/28/2018  . Morbid obesity with BMI of 40.0-44.9, adult ([place]) 10/12/2017  . IUD complication ([place]) 123XX123  . Bone metastases ([place]) 08/13/2017  . Pain from bone metastases ([place]) 08/13/2017  . Liver metastases ([place]) 08/13/2017  . Steatosis of liver 08/13/2017  . Bilateral breast cancer ([place]) 12/26/2015  . Malignant neoplasm of central portion of left breast in female, estrogen receptor positive ([place]) 11/28/2015  . Malignant neoplasm of upper-inner quadrant of right breast in female, estrogen receptor positive ([place]) 10/17/2015  . Hypertension 04/19/2012  . Hypothyroid 04/19/2012  . Fibroids 04/19/2012  . Stress incontinence, female 04/19/2012   Donato Heinz. Owens Shark PT  Norwood Levo 02/23/2019, 1:53 PM  [place], [place], [address] Phone: [phone]   Fax:  [phone]  Name: GENEVER FISSEL MRN: [id number] Date of Birth: 1965/07/21

## 2019-02-28 ENCOUNTER — Other Ambulatory Visit: Payer: Self-pay

## 2019-02-28 ENCOUNTER — Ambulatory Visit: Payer: 59 | Admitting: Physical Therapy

## 2019-02-28 DIAGNOSIS — M6281 Muscle weakness (generalized): Secondary | ICD-10-CM | POA: Diagnosis not present

## 2019-02-28 DIAGNOSIS — I972 Postmastectomy lymphedema syndrome: Secondary | ICD-10-CM

## 2019-02-28 DIAGNOSIS — Z923 Personal history of irradiation: Secondary | ICD-10-CM | POA: Diagnosis not present

## 2019-02-28 DIAGNOSIS — R293 Abnormal posture: Secondary | ICD-10-CM

## 2019-02-28 DIAGNOSIS — Z853 Personal history of malignant neoplasm of breast: Secondary | ICD-10-CM | POA: Diagnosis not present

## 2019-02-28 DIAGNOSIS — Z170 Estrogen receptor positive status [ER+]: Secondary | ICD-10-CM | POA: Diagnosis not present

## 2019-02-28 DIAGNOSIS — C787 Secondary malignant neoplasm of liver and intrahepatic bile duct: Secondary | ICD-10-CM | POA: Diagnosis not present

## 2019-02-28 DIAGNOSIS — Z9013 Acquired absence of bilateral breasts and nipples: Secondary | ICD-10-CM | POA: Diagnosis not present

## 2019-02-28 DIAGNOSIS — C7951 Secondary malignant neoplasm of bone: Secondary | ICD-10-CM | POA: Diagnosis not present

## 2019-02-28 NOTE — Therapy (Signed)
[place], [place], [address] Phone: [phone]   Fax:  [phone]  Physical Therapy Treatment  Patient Details  Name: Debbie [name] MRN: [id number] Date of Birth: 1965/12/10 Referring Provider (PT): Dr. Marlou Starks     Progress Note Reporting Period 01/17/2019  to 02/28/2019  See note below for Objective Data and Assessment of Progress/Goals.      Encounter Date: 02/28/2019  PT End of Session - 02/28/19 1348    Visit Number  20    Number of Visits  41    Date for PT Re-Evaluation  04/19/19    PT Start Time  1300    PT Stop Time  1341    PT Time Calculation (min)  41 min    Activity Tolerance  Patient tolerated treatment well    Behavior During Therapy  [facility] for tasks assessed/performed       Past Medical History:  Diagnosis Date  . Anxiety   . Arthritis   . Bone metastasis ([place])   . Breast cancer of upper-inner quadrant of right female breast [facility]) oncologist-  dr [name]--- 04/ 2019 ER/PR+ Stage IV w/ metastatic disease to liver, total spine, lung, bone   dx 06/ 2017  right breast invasive ductal carcinoma, Stage IIB, ER/PR + (pT2 pN1), grade 1-- s/p  right breast lumpecotmy w/ sln bx's and bilateral breast reduction 11-28-2015/  left breast with reduction , dx invasive ductal ca, Grade 1 (pT1cpNX) ER/PR positive/  adjuvant radiation completed 03-27-2016 to right chest wall, 2018  reclassifiec Stage IIA  . Cancer, metastatic to liver ([place])   . Chronic back pain   . Constipation   . Depression   . Family history of adverse reaction to anesthesia     mother has Copd was kept on vent   . Headache   . Heart murmur   . History of hyperthyroidism    s/p  RAI  . History of radiation therapy 02/11/16- 03/27/16   Right Chest Wall 50.4 Gy in 28 fractions, Right Chest Wall Boost 10 Gy in 5 fractions.   . Hyperlipidemia   . Hypertension   . Hypothyroidism, postradioiodine therapy    endocrinoloigst-  dr balan--  2004  s/p  RAI  . Menorrhagia 2011  . Multiple pulmonary nodules    secondary  metastatic  . PONV (postoperative nausea and vomiting)    patch helps   . SUI (stress urinary incontinence, female)   . Wears contact lenses   . Wears dentures    full upper and lower partial    Past Surgical History:  Procedure Laterality Date  . BREAST LUMPECTOMY WITH NEEDLE LOCALIZATION AND AXILLARY SENTINEL LYMPH NODE BX Right 11/28/2015   Procedure: RIGHT BREAST LUMPECTOMY WITH NEEDLE LOCALIZATION AND AXILLARY SENTINEL LYMPH NODE BX;  Surgeon: Autumn Messing III, MD;  Location: [place];  Service: General;  Laterality: Right;  . BREAST REDUCTION SURGERY Bilateral 11/28/2015   Procedure: MAMMARY REDUCTION  (BREAST) BILATERAL;  Surgeon: Crissie Reese, MD;  Location: [place];  Service: Plastics;  Laterality: Bilateral;  . CESAREAN SECTION  01/12/1993  . COLONOSCOPY    . Dental surgeries    . FEMUR IM NAIL Left 01/28/2018   Procedure: INTRAMEDULLARY (IM) NAIL FEMORAL;  Surgeon: Nicholes Stairs, MD;  Location: [place];  Service: Orthopedics;  Laterality: Left;  . HYSTEROSCOPY N/A 09/02/2017   Procedure: HYSTEROSCOPY  to remove IUD;  Surgeon: Eldred Manges, MD;  Location: [facility];  Service: Gynecology;  Laterality: N/A;  . Liver biospy    . MASTECTOMY    . MASTECTOMY W/ SENTINEL NODE BIOPSY Bilateral 12/26/2015   Procedure: BILATERAL MASTECTOMY WITH LEFT SENTINEL LYMPH NODE BIOPSY;  Surgeon: Autumn Messing III, MD;  Location: [place];  Service: General;  Laterality: Bilateral;  . Removal of IUD    . TUBAL LIGATION Bilateral 1995    There were no vitals filed for this visit.  Subjective Assessment - 02/28/19 1307    Subjective  Pt says she is moving a little bit more during the day.  The kinesiotape on her hand did not make any difference so we will not repeat that.  She is still waiting for her remake garments    Pertinent History  11/26/15- right lumpectomy, SLNB, and  breast reduction and sent tissue from left breast for biopsy, 12/26/15- bilateral mastectomy and SLNB on L, pt completed radiation 08/12/17- pt diagnosed with stage 4 breast cancer in bone, liver and lung, pt is currently undergoing chemotherapy, Oct 2019- pt had to undergo L medullary femur nailing due to impending pathologic fracture from bone mets- completed PT for 2 weeks, 2 weeks of radiation to femur, pt developed neuropathy of bilateral LEs in Dec 2019    Patient Stated Goals  to get swelling down in arm and get it controlled    Currently in Pain?  Yes   just at the bottom of the feet and at hips   Pain Score  3    with walking   Pain Location  Hip    Pain Orientation  Right;Left    Pain Descriptors / Indicators  Aching    Pain Type  Chronic pain    Pain Onset  More than a month ago    Pain Frequency  Intermittent    Aggravating Factors   standing    Pain Relieving Factors  sitting                  Outpatient Re[facility]/08/[facility]  Lymphedema Life Impact Scale Total Score  11.76 %           [facility] Adult PT Treatment/Exercise - 02/28/19 0001      High Level Balance   High Level Balance Activities  Side stepping;Backward walking;Direction changes;Sudden stops;Head turns;Marching forwards;Marching backwards    High Level Balance Comments  pt had no balance loss and was able to walk at a quick pace       Exercises   Exercises  Shoulder;Other Exercises    Other Exercises   deep diaphragmatic breathing, "box breathing" to a count of 4       Knee/Hip Exercises: Standing   Lateral Step Up  Right;Left;10 reps;Step Height: 4"    Forward Step Up  Right;Left;10 reps;Step Height: 4"    Forward Step Up Limitations  minor dyspnea with exertion     Other Standing Knee Exercises  "heel drops" to try to encourage bone strength       Shoulder Exercises: Standing   Row  Strengthening;Right;Left;Theraband    Theraband Level (Shoulder  Row)  Level 2 (Red)    Row Limitations  2 sets of 10  with both arms and 2 sets alternating       Shoulder Exercises: Pulleys   Flexion  3 minutes    Flexion Limitations  with extra stretch at the top with deep breath and extra stretch to side body  PT Long Term Goals - 02/28/19 1309      PT LONG TERM GOAL #1   Title  Pt will demonstrate maximal reduction of edema in RUE as evidenced by stabilization of circumferential measurements    Period  Weeks    Status  On-going      PT LONG TERM GOAL #2   Title  Pt will obtain appropriate compression garments for long term management of RUE lymphedema    Time  8    Period  Weeks    Status  On-going      PT LONG TERM GOAL #3   Title  Pt will be independent in self MLD for long term management of lymphedema    Baseline  and use of flexitouch    Status  Achieved      PT LONG TERM GOAL #4   Title  Pt's husband will be able to independently don pt's compression bandages to help pt reach maximal reduction    Status  Achieved      PT LONG TERM GOAL #5   Title  Pt will be able to independently verbalize lymphedema risk reduction practices    Status  Achieved      PT LONG TERM GOAL #6   Title  Pt will participate in aquatic therapy to help control lymphedema, decrease pain and increase endurance so she can continue in exercise program post discharge    Status  On-going      PT LONG TERM GOAL #7   Title  Pt will be independent in a home exercise program to increase funtional strength and endurance    Time  8    Period  Weeks    Status  On-going            Plan - 02/28/19 1351    Clinical Impression Statement  Pt appears to be doing much better.  She still has lymphedema ,but she is able to manage it with self care at home, Flexitouch, compression garments and bandaging.  She is awaiting getting remakes of her her garments so they will fit better.  She is awaiting getting notified about going to aquatic  therapy and is trying to be more active at home.  Today, I challenged her with advanced balance activities and gait as well as strengthening and she was able to do all without balance loss and only minor dyspena with HR of 114 and O2 Sat of 97 .  Feel she is ready to do exercise program  and manage lymphedema on her own at home for about a month and then come back for recheck.  Will issue HEP next session and then reschedule for recheck at that time    Comorbidities  metastatic disease, hx of radiation, neuropathy    Examination-Participation Restrictions  Other    Stability/Clinical Decision Making  Evolving/Moderate complexity    PT Next Visit Plan  Issue HEP for standing rows, steps ups,  shoulder stretching and deep breathing in addition to walking program with target HR calculated with HRR method Reschedule for 4 weeks to recheck lymphedema, garments and progress    Consulted and Agree with Plan of Care  Patient       Patient will benefit from skilled therapeutic intervention in order to improve the following deficits and impairments:  Postural dysfunction, Increased edema, Decreased knowledge of precautions  Visit Diagnosis: Postmastectomy lymphedema  Abnormal posture  Muscle weakness (generalized)     Problem List Patient Active Problem List  Diagnosis Date Noted  . Goals of care, counseling/discussion 12/27/2018  . Osteonecrosis ([place]) 11/01/2018  . Pathological fracture in neoplastic disease, left femur, initial encounter for fracture ([place]) 01/28/2018  . Morbid obesity with BMI of 40.0-44.9, adult ([place]) 10/12/2017  . IUD complication ([place]) 82/50/0370  . Bone metastases ([place]) 08/13/2017  . Pain from bone metastases ([place]) 08/13/2017  . Liver metastases ([place]) 08/13/2017  . Steatosis of liver 08/13/2017  . Bilateral breast cancer ([place]) 12/26/2015  . Malignant neoplasm of central portion of left breast in female, estrogen receptor positive ([place]) 11/28/2015  . Malignant neoplasm  of upper-inner quadrant of right breast in female, estrogen receptor positive ([place]) 10/17/2015  . Hypertension 04/19/2012  . Hypothyroid 04/19/2012  . Fibroids 04/19/2012  . Stress incontinence, female 04/19/2012   Donato Heinz. Owens Shark PT  [place] Levo 02/28/2019, 1:57 PM  [place], [place], [address] Phone: [phone]   Fax:  [phone]  Name: Debbie [name] MRN: [id number] Date of Birth: 1966/05/04

## 2019-03-02 ENCOUNTER — Other Ambulatory Visit: Payer: Self-pay

## 2019-03-02 ENCOUNTER — Encounter: Payer: Self-pay | Admitting: Physical Therapy

## 2019-03-02 ENCOUNTER — Ambulatory Visit: Payer: 59 | Admitting: Physical Therapy

## 2019-03-02 DIAGNOSIS — C787 Secondary malignant neoplasm of liver and intrahepatic bile duct: Secondary | ICD-10-CM | POA: Diagnosis not present

## 2019-03-02 DIAGNOSIS — Z9013 Acquired absence of bilateral breasts and nipples: Secondary | ICD-10-CM | POA: Diagnosis not present

## 2019-03-02 DIAGNOSIS — Z853 Personal history of malignant neoplasm of breast: Secondary | ICD-10-CM | POA: Diagnosis not present

## 2019-03-02 DIAGNOSIS — R293 Abnormal posture: Secondary | ICD-10-CM | POA: Diagnosis not present

## 2019-03-02 DIAGNOSIS — I972 Postmastectomy lymphedema syndrome: Secondary | ICD-10-CM

## 2019-03-02 DIAGNOSIS — M6281 Muscle weakness (generalized): Secondary | ICD-10-CM | POA: Diagnosis not present

## 2019-03-02 DIAGNOSIS — Z170 Estrogen receptor positive status [ER+]: Secondary | ICD-10-CM | POA: Diagnosis not present

## 2019-03-02 DIAGNOSIS — C7951 Secondary malignant neoplasm of bone: Secondary | ICD-10-CM | POA: Diagnosis not present

## 2019-03-02 DIAGNOSIS — Z923 Personal history of irradiation: Secondary | ICD-10-CM | POA: Diagnosis not present

## 2019-03-02 NOTE — Therapy (Addendum)
[place], [place], [address] Phone: [phone]   Fax:  [phone]  Physical Therapy Treatment  Patient Details  Name: Debbie [name] MRN: [id number] Date of Birth: 18-Jun-1965 Referring Provider (PT): Dr. Marlou Starks   Encounter Date: 03/02/2019  PT End of Session - 03/02/19 1339    Visit Number  21    Number of Visits  41    Date for PT Re-Evaluation  04/19/19    PT Start Time  1300    PT Stop Time  1330   pt only needed 30 minutes   PT Time Calculation (min)  30 min    Activity Tolerance  Patient tolerated treatment well    Behavior During Therapy  [facility] for tasks assessed/performed       Past Medical History:  Diagnosis Date  . Anxiety   . Arthritis   . Bone metastasis ([place])   . Breast cancer of upper-inner quadrant of right female breast [facility]) oncologist-  dr [name]--- 04/ 2019 ER/PR+ Stage IV w/ metastatic disease to liver, total spine, lung, bone   dx 06/ 2017  right breast invasive ductal carcinoma, Stage IIB, ER/PR + (pT2 pN1), grade 1-- s/p  right breast lumpecotmy w/ sln bx's and bilateral breast reduction 11-28-2015/  left breast with reduction , dx invasive ductal ca, Grade 1 (pT1cpNX) ER/PR positive/  adjuvant radiation completed 03-27-2016 to right chest wall, 2018  reclassifiec Stage IIA  . Cancer, metastatic to liver ([place])   . Chronic back pain   . Constipation   . Depression   . Family history of adverse reaction to anesthesia     mother has Copd was kept on vent   . Headache   . Heart murmur   . History of hyperthyroidism    s/p  RAI  . History of radiation therapy 02/11/16- 03/27/16   Right Chest Wall 50.4 Gy in 28 fractions, Right Chest Wall Boost 10 Gy in 5 fractions.   . Hyperlipidemia   . Hypertension   . Hypothyroidism, postradioiodine therapy    endocrinoloigst-  dr balan--  2004  s/p  RAI  . Menorrhagia 2011  . Multiple pulmonary nodules    secondary  metastatic   . PONV (postoperative nausea and vomiting)    patch helps   . SUI (stress urinary incontinence, female)   . Wears contact lenses   . Wears dentures    full upper and lower partial    Past Surgical History:  Procedure Laterality Date  . BREAST LUMPECTOMY WITH NEEDLE LOCALIZATION AND AXILLARY SENTINEL LYMPH NODE BX Right 11/28/2015   Procedure: RIGHT BREAST LUMPECTOMY WITH NEEDLE LOCALIZATION AND AXILLARY SENTINEL LYMPH NODE BX;  Surgeon: Autumn Messing III, MD;  Location: [facility];  Service: General;  Laterality: Right;  . BREAST REDUCTION SURGERY Bilateral 11/28/2015   Procedure: MAMMARY REDUCTION  (BREAST) BILATERAL;  Surgeon: Crissie Reese, MD;  Location: [facility];  Service: Plastics;  Laterality: Bilateral;  . CESAREAN SECTION  01/12/1993  . COLONOSCOPY    . Dental surgeries    . FEMUR IM NAIL Left 01/28/2018   Procedure: INTRAMEDULLARY (IM) NAIL FEMORAL;  Surgeon: Nicholes Stairs, MD;  Location: [place] [facility];  Service: Orthopedics;  Laterality: Left;  . HYSTEROSCOPY N/A 09/02/2017   Procedure: HYSTEROSCOPY  to remove IUD;  Surgeon: Eldred Manges, MD;  Location: [facility];  Service: Gynecology;  Laterality: N/A;  . Liver biospy    . MASTECTOMY    . MASTECTOMY  W/ SENTINEL NODE BIOPSY Bilateral 12/26/2015   Procedure: BILATERAL MASTECTOMY WITH LEFT SENTINEL LYMPH NODE BIOPSY;  Surgeon: Autumn Messing III, MD;  Location: [facility];  Service: General;  Laterality: Bilateral;  . Removal of IUD    . TUBAL LIGATION Bilateral 1995    There were no vitals filed for this visit.         LYMPHEDEMA/ONCOLOGY QUESTIONNAIRE - 03/02/19 1305      Right Upper Extremity Lymphedema   15 cm Proximal to Olecranon Process  40 cm    Olecranon Process  34.5 cm    15 cm Proximal to Ulnar Styloid Process  34.1 cm    Just Proximal to Ulnar Styloid Process  18.7 cm    Across Hand at [employer]  22 cm   pt has been typing today.. hand looked normal this morning    At [facility]  7 cm            Outpatient Rehab from 12/08/2018 in [place]  Lymphedema Life Impact Scale Total Score  11.76 %                        PT Long Term Goals - 03/02/19 1326      PT LONG TERM GOAL #1   Title  Pt will demonstrate maximal reduction of edema in RUE as evidenced by stabilization of circumferential measurements    Status  Achieved      PT LONG TERM GOAL #2   Title  Pt will obtain appropriate compression garments for long term management of RUE lymphedema    Baseline  03/02/2019 pt is waiting for her remakes of her garments    Status  On-going      PT LONG TERM GOAL #3   Title  Pt will be independent in self MLD for long term management of lymphedema    Baseline  and use of flexitouch    Status  Achieved      PT LONG TERM GOAL #4   Title  Pt's husband will be able to independently don pt's compression bandages to help pt reach maximal reduction    Status  Achieved      PT LONG TERM GOAL #5   Title  Pt will be able to independently verbalize lymphedema risk reduction practices    Status  Achieved      PT LONG TERM GOAL #6   Title  Pt will participate in aquatic therapy to help control lymphedema, decrease pain and increase endurance so she can continue in exercise program post discharge    Baseline  pt is not able to attend aquatics at this time due to work schedule but will contact them when her schedule frees up    Status  Deferred      PT LONG TERM GOAL #7   Title  Pt will be independent in a home exercise program to increase funtional strength and endurance    Status  Achieved            Plan - 03/02/19 1332    Clinical Impression Statement  Pt continues to have lymphedema in her right arm that she is managing her a Flexitouch, use of compresson garments, exercise and elevation. She is still working with the compression garments companies to get garments that will be easier to apply and help her manage the  swelling in her hand. She has joint stiffness and pain and has  been instructed in a home exercise program for strengthening and walking.  She will do aquatic therapy exercise in the pool when her work schedule allows.  Pt is ready to discharge from this episode  but she wants to come back when she gets her new garments so we can check them to make sure they are fitting properly    Comorbidities  metastatic disease, hx of radiation, neuropathy    PT Duration  8 weeks    PT Treatment/Interventions  ADLs/Self Care Home Management;Passive range of motion;Manual techniques;Manual lymph drainage;Aquatic Therapy;Therapeutic exercise;Therapeutic activities;Taping;Compression bandaging;Scar mobilization    PT Next Visit Plan  make sure compression garments fit ok and need no further modifications    Consulted and Agree with Plan of Care  Patient       Patient will benefit from skilled therapeutic intervention in order to improve the following deficits and impairments:  Postural dysfunction, Increased edema, Decreased knowledge of precautions  Visit Diagnosis: Postmastectomy lymphedema  Abnormal posture  Muscle weakness (generalized)     Problem List Patient Active Problem List   Diagnosis Date Noted  . Goals of care, counseling/discussion 12/27/2018  . Osteonecrosis ([place]) 11/01/2018  . Pathological fracture in neoplastic disease, left femur, initial encounter for fracture ([place]) 01/28/2018  . Morbid obesity with BMI of 40.0-44.9, adult ([place]) 10/12/2017  . IUD complication ([place]) 13/12/6576  . Bone metastases ([place]) 08/13/2017  . Pain from bone metastases ([place]) 08/13/2017  . Liver metastases ([place]) 08/13/2017  . Steatosis of liver 08/13/2017  . Bilateral breast cancer ([place]) 12/26/2015  . Malignant neoplasm of central portion of left breast in female, estrogen receptor positive ([place]) 11/28/2015  . Malignant neoplasm of upper-inner quadrant of right breast in female, estrogen receptor positive  ([place]) 10/17/2015  . Hypertension 04/19/2012  . Hypothyroid 04/19/2012  . Fibroids 04/19/2012  . Stress incontinence, female 04/19/2012   Donato Heinz. Owens Shark, PT  [place] Levo 03/02/2019, 1:40 PM  [place], [place], [address] Phone: [phone]   Fax:  [phone]  Name: Debbie [name] MRN: [id number] Date of Birth: Mar 16, 1966 PHYSICAL THERAPY DISCHARGE SUMMARY  Visits from Start of Care: 21  Current functional level related to goals / functional outcomes: Unknown as pt has not returned to PT . Do no know if she followed up with aquatic therapy   Remaining deficits: unknown   Education / Equipment: Home exercise, home management of lymphedema  Plan: Patient agrees to discharge.  Patient goals were partially met. Patient is being discharged due to not returning since the last visit.  ?????    Donato Heinz. Owens Shark, PT

## 2019-03-02 NOTE — Patient Instructions (Signed)
Access Code: [email or web address]  URL: [email or web address]  Date: 03/02/2019  Prepared by: Maudry Diego   Exercises Seated Diaphragmatic Breathing - 5 reps - 1x daily - 7x weekly Standing Shoulder Flexion Stretch on Wall - 5 reps - 1x daily - 7x weekly Standing Plank on Wall - 6 reps - 10 seconds hold - 1x daily - 7x weekly Step Up - 10 reps - 3 sets - 1x daily - 7x weekly Lateral Step Up - 10 reps - 3 sets - 1x daily - 7x weekly Patient Education walking program

## 2019-03-07 ENCOUNTER — Encounter: Payer: 59 | Admitting: Physical Therapy

## 2019-03-07 MED FILL — LOSARTAN POTASSIUM 100 MG T: 100 | 30 days supply | Qty: 30 | Fill #8

## 2019-03-07 MED FILL — AFINITOR 10 MG TABLET: 10 | 28 days supply | Qty: 28 | Fill #2

## 2019-03-07 MED FILL — PREGABALIN 100 MG CAPS: 100 | 30 days supply | Qty: 90 | Fill #2

## 2019-03-09 ENCOUNTER — Encounter: Payer: 59 | Admitting: Physical Therapy

## 2019-03-21 ENCOUNTER — Other Ambulatory Visit: Payer: Self-pay

## 2019-03-21 ENCOUNTER — Inpatient Hospital Stay (HOSPITAL_BASED_OUTPATIENT_CLINIC_OR_DEPARTMENT_OTHER): Payer: 59 | Admitting: Adult Health

## 2019-03-21 ENCOUNTER — Encounter: Payer: Self-pay | Admitting: Adult Health

## 2019-03-21 ENCOUNTER — Inpatient Hospital Stay: Payer: 59 | Attending: Oncology

## 2019-03-21 ENCOUNTER — Ambulatory Visit: Payer: 59

## 2019-03-21 ENCOUNTER — Inpatient Hospital Stay: Payer: 59

## 2019-03-21 VITALS — BP 144/52 | HR 97 | Temp 98.5°F | Resp 18 | Ht 66.5 in | Wt 271.3 lb

## 2019-03-21 DIAGNOSIS — Z170 Estrogen receptor positive status [ER+]: Secondary | ICD-10-CM | POA: Diagnosis not present

## 2019-03-21 DIAGNOSIS — Z79811 Long term (current) use of aromatase inhibitors: Secondary | ICD-10-CM | POA: Diagnosis not present

## 2019-03-21 DIAGNOSIS — I972 Postmastectomy lymphedema syndrome: Secondary | ICD-10-CM | POA: Insufficient documentation

## 2019-03-21 DIAGNOSIS — C50011 Malignant neoplasm of nipple and areola, right female breast: Secondary | ICD-10-CM | POA: Diagnosis not present

## 2019-03-21 DIAGNOSIS — C50211 Malignant neoplasm of upper-inner quadrant of right female breast: Secondary | ICD-10-CM

## 2019-03-21 DIAGNOSIS — C50112 Malignant neoplasm of central portion of left female breast: Secondary | ICD-10-CM

## 2019-03-21 DIAGNOSIS — C7951 Secondary malignant neoplasm of bone: Secondary | ICD-10-CM

## 2019-03-21 DIAGNOSIS — Z853 Personal history of malignant neoplasm of breast: Secondary | ICD-10-CM | POA: Insufficient documentation

## 2019-03-21 DIAGNOSIS — C50012 Malignant neoplasm of nipple and areola, left female breast: Secondary | ICD-10-CM

## 2019-03-21 DIAGNOSIS — Z923 Personal history of irradiation: Secondary | ICD-10-CM | POA: Diagnosis not present

## 2019-03-21 DIAGNOSIS — Z9013 Acquired absence of bilateral breasts and nipples: Secondary | ICD-10-CM | POA: Insufficient documentation

## 2019-03-21 DIAGNOSIS — C787 Secondary malignant neoplasm of liver and intrahepatic bile duct: Secondary | ICD-10-CM | POA: Insufficient documentation

## 2019-03-21 DIAGNOSIS — M6281 Muscle weakness (generalized): Secondary | ICD-10-CM | POA: Diagnosis not present

## 2019-03-21 LAB — COMPREHENSIVE METABOLIC PANEL
ALT: 34 U/L (ref 0–44)
AST: 26 U/L (ref 15–41)
Albumin: 3.3 g/dL — ABNORMAL LOW (ref 3.5–5.0)
Alkaline Phosphatase: 147 U/L — ABNORMAL HIGH (ref 38–126)
Anion gap: 14 (ref 5–15)
BUN: 11 mg/dL (ref 6–20)
CO2: 24 mmol/L (ref 22–32)
Calcium: 9.5 mg/dL (ref 8.9–10.3)
Chloride: 104 mmol/L (ref 98–111)
Creatinine, Ser: 0.85 mg/dL (ref 0.44–1.00)
GFR calc Af Amer: >60 mL/min (ref >60)
GFR calc non Af Amer: >60 mL/min (ref >60)
Glucose, Bld: 120 mg/dL — ABNORMAL HIGH (ref 70–99)
Potassium: 3.5 mmol/L (ref 3.5–5.1)
Sodium: 142 mmol/L (ref 135–145)
Total Bilirubin: 0.3 mg/dL (ref 0.3–1.2)
Total Protein: 7.1 g/dL (ref 6.5–8.1)

## 2019-03-21 LAB — CBC WITH DIFFERENTIAL/PLATELET
Abs Immature Granulocytes: 0.02 10*3/uL (ref 0.00–0.07)
Basophils Absolute: 0 10*3/uL (ref 0.0–0.1)
Basophils Relative: 1 %
Eosinophils Absolute: 0.1 10*3/uL (ref 0.0–0.5)
Eosinophils Relative: 3 %
HCT: 36.5 % (ref 36.0–46.0)
Hemoglobin: 12.1 g/dL (ref 12.0–15.0)
Immature Granulocytes: 1 %
Lymphocytes Relative: 10 %
Lymphs Abs: 0.4 10*3/uL — ABNORMAL LOW (ref 0.7–4.0)
MCH: 28.5 pg (ref 26.0–34.0)
MCHC: 33.2 g/dL (ref 30.0–36.0)
MCV: 86.1 fL (ref 80.0–100.0)
Monocytes Absolute: 0.4 10*3/uL (ref 0.1–1.0)
Monocytes Relative: 10 %
Neutro Abs: 3.1 10*3/uL (ref 1.7–7.7)
Neutrophils Relative %: 75 %
Platelets: 315 10*3/uL (ref 150–400)
RBC: 4.24 MIL/uL (ref 3.87–5.11)
RDW: 14.8 % (ref 11.5–15.5)
WBC: 4.1 10*3/uL (ref 4.0–10.5)
nRBC: 0 % (ref 0.0–0.2)

## 2019-03-21 MED ORDER — DENOSUMAB 120 MG/1.7ML ~~LOC~~ SOLN
SUBCUTANEOUS | Status: AC
  Filled 2019-03-21: qty 1.7

## 2019-03-21 MED ORDER — DENOSUMAB 120 MG/1.7ML ~~LOC~~ SOLN
120.0000 mg | Freq: Once | SUBCUTANEOUS | Status: AC
  Administered 2019-03-21: 120 mg via SUBCUTANEOUS

## 2019-03-21 NOTE — Progress Notes (Signed)
[place]  [phone]) [phone] [phone]) [phone]    ID: Debbie [name] DOB: 06/01/1965  MR#: [id number]  PXT#:[id number]  Patient Care Team: Kelton Pillar, MD as PCP - General (Family Medicine) Jacelyn Pi, MD as Consulting Physician (Endocrinology) Jovita Kussmaul, MD as Consulting Physician (General Surgery) [name], Virgie Dad, MD as Consulting Physician (Oncology) [name], MD as Attending Physician (Radiation Oncology) Belva Crome, MD as Consulting Physician (Cardiology) Crissie Reese, MD as Consulting Physician (Plastic Surgery) [name], [name], NP as Nurse Practitioner (Hematology and Oncology) Margarette Canada (Inactive) OTHER MD:   CHIEF COMPLAINT: Estrogen receptor positive stage IV  breast cancer (s/p bilateral mastectomies)  CURRENT TREATMENT:  Anastrozole, affinitor; denosumab/Xgeva   INTERVAL HISTORY: Debbie [name] returns today for follow-up and treatment of her estrogen receptor positive stage IV breast cancer.  She continues on anastrozole, with good tolerance.  She has no issues with vaginal dryness, hot flashes, or arthralgias from this.    She also continues on affinitor.  She is tolerating this well, with no adverse effects.  She is only using the dexamethasone mouth rinse as needed.    She also continues on denosumab/Xgeva every 28 days.  She notes that she had an infected bone in her mouth and underwent oral surgery on 01/06/2019.  She is healing well.  She was cleared by Dr. Romie Minus to restart the [facility] and this note is is in her chart.     REVIEW OF SYSTEMS: Debbie [name] is doing well today.  She has intermittent nausea that comes in waves.  She will eat a chip, something salty, or something sour, it will help.  Sometimes she will take a tums and burp and that helps also.  She has maintained her appetite.    Debbie [name] notes that earlier this week she sat up wrong and has some achiness in her lower abdomen.  This is improving,  but she wanted to tell us about it.    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  She works 6am to 6pm forty hours per week, plus overtime if needed.  Debbie [name] stands up at her desk, she parks further in the parking lot.  She does have some lower back discomfort and is considering getting a waist band to help with this.  She walks on a treadmill during lunch at work.    Debbie [name] is drinking plenty of water.  She denies any new issues, such as new pain, fevers, chills, chest pain, palpitations, cough, shortness of breath, bowel/bladder changes, vomiting.  A detailed ROS was otherwise non contributory.     BREAST CANCER HISTORY: From the original intake note:  Debbie [name] had screening mammography with tomosynthesis at the [facility] 10/02/2015. This showed 2 possible masses in the right breast as well as some suspicious calcifications. The left breast was unremarkable. On 10/15/2015 she underwent right diagnostic mammography with tomography and right breast ultrasonography. The breast density was category B. Spot magnification views confirmed an area of pleomorphic calcifications measuring up to 5 cm in the upper half of the breast. Also in the upper outer right breast there were adjacent low-density masses without distortion or calcifications. On physical exam there was no palpable abnormality.  Targeted ultrasound of the right breast found the 2 "masses" in the right upper quadrant were benign cysts measuring 0.8 and 0.5 cm respectively. Biopsy of the area of calcification on 10/15/2015 showed ([id number]) invasive ductal carcinoma, E-cadherin positive, grade 2, estrogen receptor 90% positive,  progesterone receptor 50% positive, both with strong staining intensity, with an MIB-1 of 70%, and no HER-2 amplification, the signals ratio being 1.18 and the number per cell 2.60.  Her subsequent history is as detailed below    PAST MEDICAL HISTORY: Past Medical History:  Diagnosis  Date  . Anxiety   . Arthritis   . Bone metastasis ([place])   . Breast cancer of upper-inner quadrant of right female breast [facility]) oncologist-  dr [name]--- 04/ 2019 ER/PR+ Stage IV w/ metastatic disease to liver, total spine, lung, bone   dx 06/ 2017  right breast invasive ductal carcinoma, Stage IIB, ER/PR + (pT2 pN1), grade 1-- s/p  right breast lumpecotmy w/ sln bx's and bilateral breast reduction 11-28-2015/  left breast with reduction , dx invasive ductal ca, Grade 1 (pT1cpNX) ER/PR positive/  adjuvant radiation completed 03-27-2016 to right chest wall, 2018  reclassifiec Stage IIA  . Cancer, metastatic to liver ([place])   . Chronic back pain   . Constipation   . Depression   . Family history of adverse reaction to anesthesia     mother has Copd was kept on vent   . Headache   . Heart murmur   . History of hyperthyroidism    s/p  RAI  . History of radiation therapy 02/11/16- 03/27/16   Right Chest Wall 50.4 Gy in 28 fractions, Right Chest Wall Boost 10 Gy in 5 fractions.   . Hyperlipidemia   . Hypertension   . Hypothyroidism, postradioiodine therapy    endocrinoloigst-  dr balan--  2004  s/p  RAI  . Menorrhagia 2011  . Multiple pulmonary nodules    secondary  metastatic  . PONV (postoperative nausea and vomiting)    patch helps   . SUI (stress urinary incontinence, female)   . Wears contact lenses   . Wears dentures    full upper and lower partial    PAST SURGICAL HISTORY: Past Surgical History:  Procedure Laterality Date  . BREAST LUMPECTOMY WITH NEEDLE LOCALIZATION AND AXILLARY SENTINEL LYMPH NODE BX Right 11/28/2015   Procedure: RIGHT BREAST LUMPECTOMY WITH NEEDLE LOCALIZATION AND AXILLARY SENTINEL LYMPH NODE BX;  Surgeon: Autumn Messing III, MD;  Location: [place];  Service: General;  Laterality: Right;  . BREAST REDUCTION SURGERY Bilateral 11/28/2015   Procedure: MAMMARY REDUCTION  (BREAST) BILATERAL;  Surgeon: Crissie Reese, MD;  Location: [place];  Service: Plastics;  Laterality:  Bilateral;  . CESAREAN SECTION  01/12/1993  . COLONOSCOPY    . Dental surgeries    . FEMUR IM NAIL Left 01/28/2018   Procedure: INTRAMEDULLARY (IM) NAIL FEMORAL;  Surgeon: Nicholes Stairs, MD;  Location: [place];  Service: Orthopedics;  Laterality: Left;  . HYSTEROSCOPY N/A 09/02/2017   Procedure: HYSTEROSCOPY  to remove IUD;  Surgeon: Eldred Manges, MD;  Location[facility];  Service: Gynecology;  Laterality: N/A;  . Liver biospy    . MASTECTOMY    . MASTECTOMY W/ SENTINEL NODE BIOPSY Bilateral 12/26/2015   Procedure: BILATERAL MASTECTOMY WITH LEFT SENTINEL LYMPH NODE BIOPSY;  Surgeon: Autumn Messing III, MD;  Location: [place];  Service: General;  Laterality: Bilateral;  . Removal of IUD    . TUBAL LIGATION Bilateral 1995    FAMILY HISTORY: Family History  Problem Relation Age of Onset  . Diabetes Mother   . Hypertension Mother   . Hypertension Father   . Diabetes Maternal Grandmother   . Hypertension Sister   . Cancer Paternal Grandmother  colon  . Colon cancer Paternal Grandmother   . Esophageal cancer Neg Hx   . Stomach cancer Neg Hx   . Rectal cancer Neg Hx    The patient's parents are still living, in their early 66s as of June 2017. The patient has one brother, 2 sisters. On the maternal side there is a history of uterine and prostate cancer. On the paternal side there is a history of colon cancer and possibly uterine cancer. There is no history of breast or ovarian cancer in the family.    GYNECOLOGIC HISTORY:  No LMP recorded. (Menstrual status: IUD).  Menarche age 47. She still having regular periods. She is GX P1, first pregnancy age 50. She used oral contraceptives for a few years remotely, with no complications. She is currently on a Mirena IUD and also is status post bilateral tubal ligation.   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 Her husband Debbie [name] is a Administrator.  They are both taking  appropriate precautions at work.  Their son Debbie [name], age 20, is a radio DJ.  He no longer lives with the patient.  The patient has no grandchildren. She is not a Ambulance person.   ADVANCED DIRECTIVES: Not in place   HEALTH MAINTENANCE: Social History   Tobacco Use  . Smoking status: Never Smoker  . Smokeless tobacco: Never Used  Substance Use Topics  . Alcohol use: No  . Drug use: No     Colonoscopy: January 2016   PAP: January 2016   Bone density: August 2017 showed a T score of +0.7 normal  Lipid panel:  Allergies  Allergen Reactions  . Ace Inhibitors Cough  . Atorvastatin Other ([name] Comments)    Joint pain  . Simvastatin Other ([name] Comments)    Joint pain    Current Outpatient Medications  Medication Sig Dispense Refill  . acetaminophen (TYLENOL) 325 MG tablet Take 325 mg by mouth every 6 (six) hours as needed for mild pain or headache.     Lenon Oms 10 MG tablet TAKE 1 TABLET BY MOUTH DAILY. 28 tablet 2  . anastrozole (ARIMIDEX) 1 MG tablet Take 1 tablet (1 mg total) by mouth daily. 90 tablet 4  . Calcium-Magnesium 250-125 MG TABS Take 1 tablet by mouth daily. 120 each 4  . cetirizine (ZYRTEC ALLERGY) 10 MG tablet Take 10 mg by mouth daily as needed for allergies.     . chlorhexidine (PERIDEX) 0.12 % solution     . dexamethasone (DECADRON) 0.5 MG/5ML solution Swish 20m in mouth for 289m and spit out. Use 4 times daily for 8 weeks, start with Afinitor. Avoid eating/drinking for 1hr after rinse. 500 mL 4  . docusate sodium (COLACE) 100 MG capsule Take 200-400 mg by mouth daily.    . Ferrous Sulfate (IRON) 325 (65 Fe) MG TABS Take 1 tablet (325 mg total) by mouth daily. 30 each 2  . fluconazole (DIFLUCAN) 100 MG tablet Take 1 tablet (100 mg total) by mouth daily. 5 tablet 0  . hydrocortisone cream 1 % Apply 1 application topically daily as needed (for rash).    . Marland Kitchenbuprofen (ADVIL,MOTRIN) 600 MG tablet Take 1 tablet (600 mg total) by mouth every 6 (six) hours as  needed for moderate pain. 60 tablet 0  . levothyroxine (SYNTHROID, LEVOTHROID) 137 MCG tablet Take 137 mcg by mouth daily before breakfast.     . LIVALO 2 MG TABS Take 2 mg by mouth daily.  6  . LORazepam (ATIVAN) 0.5 MG tablet Take 1 tablet (0.5 mg total) by mouth at bedtime as needed for anxiety. 30 tablet 0  . losartan (COZAAR) 100 MG tablet Take 100 mg by mouth every morning.     . Multiple Vitamin (MULTIVITAMIN) tablet Take 1 tablet by mouth daily.    Marland Kitchen omega-3 acid ethyl esters (LOVAZA) 1 g capsule Take 1 g by mouth 2 (two) times daily.     . potassium chloride SA (K-DUR,KLOR-CON) 20 MEQ tablet Take 40 mEq by mouth daily.     . pregabalin (LYRICA) 100 MG capsule TAKE 1 CAPSULE BY MOUTH 3 TIMES DAILY 90 capsule 2  . prochlorperazine (COMPAZINE) 10 MG tablet Take 1 tablet (10 mg total) by mouth every 6 (six) hours as needed for nausea or vomiting. 30 tablet 0  . triamterene-hydrochlorothiazide (DYAZIDE) 37.5-25 MG capsule Take 1 each (1 capsule total) by mouth daily. 90 capsule 4  . Vitamin D, Cholecalciferol, 1000 units CAPS Take 1 tablet by mouth daily. (Patient taking differently: Take 1,000 Units by mouth daily. ) 90 capsule 4   No current facility-administered medications for this visit.     OBJECTIVE: Morbidly obese African-American woman who appears stated age Vitals:   03/21/19 0818  BP: (!) 144/52  Pulse: 97  Resp: 18  Temp: 98.5 F (36.9 C)  SpO2: 100%     Body mass index is 43.13 kg/m.    ECOG FS:1 - Symptomatic but completely ambulatory  Filed Weights   03/21/19 0818  Weight: 271 lb 4.8 oz (123.1 kg)   GENERAL: Patient is a well appearing female in no acute distress HEENT:  Sclerae anicteric.  Oropharynx clear and moist. No ulcerations or evidence of oropharyngeal candidiasis. Neck is supple. Left posterior gum line healing well.    NODES:  No cervical, supraclavicular, or axillary lymphadenopathy palpated.  BREAST EXAM:  Deferred, examined last month no concerns  today LUNGS:  Clear to auscultation bilaterally.  No wheezes or rhonchi. HEART:  Regular rate and rhythm. No murmur appreciated. ABDOMEN:  Soft, nontender.  Positive, normoactive bowel sounds. No organomegaly palpated. MSK:  No focal spinal tenderness to palpation. Full range of motion bilaterally in the upper extremities. EXTREMITIES:  No peripheral edema.   SKIN:  Clear with no obvious rashes or skin changes. No nail dyscrasia. NEURO:  Nonfocal. Well oriented.  Appropriate affect.     LAB RESULTS:  CMP     Component Value Date/Time   NA 144 02/21/2019 0823   NA 140 07/08/2016 0948   K 4.5 02/21/2019 0823   K 4.1 07/08/2016 0948   CL 107 02/21/2019 0823   CO2 27 02/21/2019 0823   CO2 24 07/08/2016 0948   GLUCOSE 134 (H) 02/21/2019 0823   GLUCOSE 89 07/08/2016 0948   BUN 11 02/21/2019 0823   BUN 11.9 07/08/2016 0948   CREATININE 0.94 02/21/2019 0823   CREATININE 0.93 11/01/2018 0742   CREATININE 0.9 07/08/2016 0948   CALCIUM 9.2 02/21/2019 0823   CALCIUM 10.0 07/08/2016 0948   PROT 7.0 02/21/2019 0823   PROT 7.6 07/08/2016 0948   ALBUMIN 3.2 (L) 02/21/2019 0823   ALBUMIN 3.6 07/08/2016 0948   AST 30 02/21/2019 0823   AST 29 11/01/2018 0742   AST 22 07/08/2016 0948   ALT 34 02/21/2019 0823   ALT 35 11/01/2018 0742   ALT 27 07/08/2016 0948   ALKPHOS 145 (H) 02/21/2019 0823   ALKPHOS 115 07/08/2016 0948   BILITOT 0.3 02/21/2019 6384  BILITOT 0.3 11/01/2018 0742   BILITOT 0.35 07/08/2016 0948   GFRNONAA >60 02/21/2019 0823   GFRNONAA >60 11/01/2018 0742   GFRAA >60 02/21/2019 0823   GFRAA >60 11/01/2018 0742    INo results found for: SPEP, UPEP  Lab Results  Component Value Date   WBC 4.1 03/21/2019   NEUTROABS 3.1 03/21/2019   HGB 12.1 03/21/2019   HCT 36.5 03/21/2019   MCV 86.1 03/21/2019   PLT 315 03/21/2019      Chemistry      Component Value Date/Time   NA 144 02/21/2019 0823   NA 140 07/08/2016 0948   K 4.5 02/21/2019 0823   K 4.1 07/08/2016  0948   CL 107 02/21/2019 0823   CO2 27 02/21/2019 0823   CO2 24 07/08/2016 0948   BUN 11 02/21/2019 0823   BUN 11.9 07/08/2016 0948   CREATININE 0.94 02/21/2019 0823   CREATININE 0.93 11/01/2018 0742   CREATININE 0.9 07/08/2016 0948      Component Value Date/Time   CALCIUM 9.2 02/21/2019 0823   CALCIUM 10.0 07/08/2016 0948   ALKPHOS 145 (H) 02/21/2019 0823   ALKPHOS 115 07/08/2016 0948   AST 30 02/21/2019 0823   AST 29 11/01/2018 0742   AST 22 07/08/2016 0948   ALT 34 02/21/2019 0823   ALT 35 11/01/2018 0742   ALT 27 07/08/2016 0948   BILITOT 0.3 02/21/2019 0823   BILITOT 0.3 11/01/2018 0742   BILITOT 0.35 07/08/2016 0948       No results found for: LABCA2  No components found for: [id number]  No results for input(s): INR in the last 168 hours.  Urinalysis No results found for: COLORURINE, APPEARANCEUR, LABSPEC, PHURINE, GLUCOSEU, HGBUR, BILIRUBINUR, KETONESUR, PROTEINUR, UROBILINOGEN, NITRITE, LEUKOCYTESUR   STUDIES: No results found.   ELIGIBLE FOR AVAILABLE RESEARCH PROTOCOL: PALLAS, but inelegible because of Mirena IUD   ASSESSMENT: 53 y.o. [place] woman status post right breast upper inner quadrant biopsy 10/15/2015 for a clinical T2-T3 N0, stage II invasive ductal carcinoma, estrogen and progesterone receptor positive, HER-2 nonamplified, with an Mib-1 of 70%  (1) status post right lumpectomy and sentinel lymph node sampling with oncoplastic breast reduction 11/28/2015 for a pT2 pN1, stage IIB invasive ductal carcinoma, grade 1, with negative margins (1/1 sentinel node positive)  (a) reclassified as stage IIA in the 2018 prognostic revision   (2) left reduction mammoplasty 11/28/2015 unexpectedly found a pT1c pNX invasive ductal carcinoma, grade 1, estrogen and progesterone receptor positive, HER-2 not amplified, with an MIB-1 of 3%  (3) Mammaprint from the right sided breast cancer was "low risk", predicting a 98% chance of no disease recurrence within 5  years with anti-estrogens as the only systemic therapy. It also predicts minimal to no benefit from chemotherapy  (4) status post bilateral mastectomies with bilateral sentinel lymph node sampling 12/26/2015, showing  (a) on the right, no residual carcinoma (0/3 nodes involved)  (b) on the left, focal ductal carcinoma in situ, 0.2 cm, with negative margins; (0/4 nodes involved)  (5) adjuvant radiation 02/11/16 - 03/27/16    1) Right chest wall: 50.4 Gy in 28 fractions               2) Right chest wall boost: 10 Gy in 5 fractions   (6) started on tamoxifen neoadjuvantly 10/23/2015 to allow for expected delays in definitive local treatment  (a) Mirena IUD in place  (b) tamoxifen discontinued 08/13/2017, with progression  (7) since she had 4 lymph nodes removed from each  axilla but also receive radiation on the right, lab draws should preferentially be obtained from the left arm   METASTATIC DISEASE: April 2019 (8) CT scan of the abdomen and pelvis 08/04/2017, MRI of the liver and total spine MRI 08/11/2017 and CT scan of the chest and brain on 08/13/2017 showed liver and bone metastases  (a) liver biopsy 09/07/2017 confirms estrogen receptor positive, progesterone receptor negative, HER-2 negative metastatic breast cancer  (b) chest CT scan 08/13/2017 shows multiple bilateral pulmonary nodules.  (c) CT of the brain with and without contrast 08/26/2017 shows no evidence of metastatic disease to the brain  (d) CA 27-29 is informative  (9) fulvestrant started 08/17/2017  (a) palbociclib started 08/31/2017  (b) fulvestrant and palbociclib discontinued 01/04/2018 with evidence of progression in the liver  (10) denosumab/Xgeva started 09/14/2017  (a) held after 03/01/2018 dose because concern re. osteonecrosis  (b) resumed 06/09/2018  (c) Held starting 02/21/2019 due to oral surgery  (11) anastrozole started 01/04/2018  (a) everolimus started 01/29/2018  (b) liver MRI and bone scan December  2019 show stable disease  (c) chest CT scan on 08/02/2018 shows lung lesions resolved, bone lesions stable  (d) bone scan 09/13/2018 essentially stable  (e) liver MRI 12/08/2018 shows improvement in hepatic metastsases  (12) 01/28/18 prophylactic left femur intramedullary nail surgery for impending left femur pathologic fracture  (a) palliative radiation with Dr. [name] to the left femur and left ilium from 02/16/2018 to 03/01/2018  (13) [facility] report on liver biopsy from April 2019 shows a negative PDL 1, stable MSI and proficient mismatch repair status.  The tumor is positive for the androgen receptor, and for PTEN for PIK3 CA mutations.  HER-2/neu was read as equivocal (it was negative by FISH on the pathology report).   PLAN: Debbie [name] is doing well today.  She continues to tolerate the Anastrozole and Everolimus well and will continue this.  Her labs are stable thus far.  We reviewed that her CA 27-29 was increased by 10 points when it was drawn last in October.  I reviewed that it can fluctuate by 10 points sometimes.  We will have more information from her levels today, in addition, she will be undergoing restaging in early December with a bone scan and MRI liver.    We reviewed her Debbie [name] today.  I reviewed the documentation from Dr. Romie Minus, her oral surgeon clearing her to restart Xgeva.  She will restart today, her mouth has healed, and she is in agreement with this approach.  I placed the letter to be scanned.    Debbie [name] and I reviewed her lower abdominal muscle strain.  I recommended she use tylenol if needed, or tylenol and aleve, and if it worsens to let us know.    I reviewed with Debbie [name] that her weight is up slightly and recommend continued healthy diet and exercise.  She understands this.    Debbie [name] will return in 4 weeks for labs, f/u, and Xgeva.  She was recommended to continue with the appropriate pandemic precautions. She knows to call for any questions that may arise between now and  her next appointment.  We are happy to [name] her sooner if needed.  A total of (20) minutes of face-to-face time was spent with this patient with greater than 50% of that time in counseling and care-coordination.   Wilber Bihari, NP 03/21/19 8:37 AM Medical Oncology and Hematology [facility] [address] [place], [place] [address] Tel. [phone]    Fax.  [phone]

## 2019-03-21 NOTE — Patient Instructions (Signed)
Denosumab injection What is this medicine? DENOSUMAB (den oh sue mab) slows bone breakdown. Prolia is used to treat osteoporosis in women after menopause and in men, and in people who are taking corticosteroids for 6 months or more. Xgeva is used to treat a high calcium level due to cancer and to prevent bone fractures and other bone problems caused by multiple myeloma or cancer bone metastases. Xgeva is also used to treat giant cell tumor of the bone. This medicine may be used for other purposes; ask your health care provider or pharmacist if you have questions. COMMON BRAND NAME(S): Prolia, XGEVA What should I tell my health care provider before I take this medicine? They need to know if you have any of these conditions:  dental disease  having surgery or tooth extraction  infection  kidney disease  low levels of calcium or Vitamin D in the blood  malnutrition  on hemodialysis  skin conditions or sensitivity  thyroid or parathyroid disease  an unusual reaction to denosumab, other medicines, foods, dyes, or preservatives  pregnant or trying to get pregnant  breast-feeding How should I use this medicine? This medicine is for injection under the skin. It is given by a health care professional in a hospital or clinic setting. A special MedGuide will be given to you before each treatment. Be sure to read this information carefully each time. For Prolia, talk to your pediatrician regarding the use of this medicine in children. Special care may be needed. For Xgeva, talk to your pediatrician regarding the use of this medicine in children. While this drug may be prescribed for children as young as 13 years for selected conditions, precautions do apply. Overdosage: If you think you have taken too much of this medicine contact a poison control center or emergency room at once. NOTE: This medicine is only for you. Do not share this medicine with others. What if I miss a dose? It is  important not to miss your dose. Call your doctor or health care professional if you are unable to keep an appointment. What may interact with this medicine? Do not take this medicine with any of the following medications:  other medicines containing denosumab This medicine may also interact with the following medications:  medicines that lower your chance of fighting infection  steroid medicines like prednisone or cortisone This list may not describe all possible interactions. Give your health care provider a list of all the medicines, herbs, non-prescription drugs, or dietary supplements you use. Also tell them if you smoke, drink alcohol, or use illegal drugs. Some items may interact with your medicine. What should I watch for while using this medicine? Visit your doctor or health care professional for regular checks on your progress. Your doctor or health care professional may order blood tests and other tests to see how you are doing. Call your doctor or health care professional for advice if you get a fever, chills or sore throat, or other symptoms of a cold or flu. Do not treat yourself. This drug may decrease your body's ability to fight infection. Try to avoid being around people who are sick. You should make sure you get enough calcium and vitamin D while you are taking this medicine, unless your doctor tells you not to. Discuss the foods you eat and the vitamins you take with your health care professional. See your dentist regularly. Brush and floss your teeth as directed. Before you have any dental work done, tell your dentist you are   receiving this medicine. Do not become pregnant while taking this medicine or for 5 months after stopping it. Talk with your doctor or health care professional about your birth control options while taking this medicine. Women should inform their doctor if they wish to become pregnant or think they might be pregnant. There is a potential for serious side  effects to an unborn child. Talk to your health care professional or pharmacist for more information. What side effects may I notice from receiving this medicine? Side effects that you should report to your doctor or health care professional as soon as possible:  allergic reactions like skin rash, itching or hives, swelling of the face, lips, or tongue  bone pain  breathing problems  dizziness  jaw pain, especially after dental work  redness, blistering, peeling of the skin  signs and symptoms of infection like fever or chills; cough; sore throat; pain or trouble passing urine  signs of low calcium like fast heartbeat, muscle cramps or muscle pain; pain, tingling, numbness in the hands or feet; seizures  unusual bleeding or bruising  unusually weak or tired Side effects that usually do not require medical attention (report to your doctor or health care professional if they continue or are bothersome):  constipation  diarrhea  headache  joint pain  loss of appetite  muscle pain  runny nose  tiredness  upset stomach This list may not describe all possible side effects. Call your doctor for medical advice about side effects. You may report side effects to FDA at 1-800-FDA-1088. Where should I keep my medicine? This medicine is only given in a clinic, doctor's office, or other health care setting and will not be stored at home. NOTE: This sheet is a summary. It may not cover all possible information. If you have questions about this medicine, talk to your doctor, pharmacist, or health care provider.  2020 [employer] (2017-09-03 16:10:44)

## 2019-03-22 LAB — CANCER ANTIGEN 27.29: CA 27.29: 31 U/mL (ref 0.0–38.6)

## 2019-03-27 MED FILL — ANASTROZOLE 1 MG TABLET: 1 | 90 days supply | Qty: 90 | Fill #0

## 2019-03-29 ENCOUNTER — Other Ambulatory Visit: Payer: Self-pay | Admitting: *Deleted

## 2019-03-29 MED ORDER — ANASTROZOLE 1 MG PO TABS: 1.0000 mg | ORAL_TABLET | Freq: Every day | ORAL | 4 refills | Status: DC

## 2019-03-30 ENCOUNTER — Other Ambulatory Visit: Payer: Self-pay | Admitting: Oncology

## 2019-03-30 DIAGNOSIS — Z170 Estrogen receptor positive status [ER+]: Secondary | ICD-10-CM

## 2019-03-30 DIAGNOSIS — C50211 Malignant neoplasm of upper-inner quadrant of right female breast: Secondary | ICD-10-CM

## 2019-03-31 ENCOUNTER — Other Ambulatory Visit: Payer: Self-pay | Admitting: Oncology

## 2019-04-03 MED FILL — AFINITOR 10 MG TABLET: 10 | 28 days supply | Qty: 28 | Fill #0

## 2019-04-03 MED FILL — PREGABALIN 100 MG CAPS: 100 | 30 days supply | Qty: 90 | Fill #0

## 2019-04-03 MED FILL — LOSARTAN POTASSIUM 100 MG T: 100 | 90 days supply | Qty: 90 | Fill #0

## 2019-04-13 ENCOUNTER — Other Ambulatory Visit: Payer: Self-pay

## 2019-04-13 ENCOUNTER — Ambulatory Visit (HOSPITAL_COMMUNITY)
Admission: RE | Admit: 2019-04-13 | Discharge: 2019-04-13 | Disposition: A | Discharge status: Home / Self Care | Payer: 59 | Source: Ambulatory Visit | Attending: Adult Health | Admitting: Adult Health

## 2019-04-13 ENCOUNTER — Telehealth: Payer: Self-pay | Admitting: Adult Health

## 2019-04-13 ENCOUNTER — Encounter (HOSPITAL_COMMUNITY)
Admission: RE | Admit: 2019-04-13 | Discharge: 2019-04-13 | Disposition: A | Discharge status: Home / Self Care | Payer: 59 | Source: Ambulatory Visit | Attending: Adult Health | Admitting: Adult Health

## 2019-04-13 DIAGNOSIS — C50211 Malignant neoplasm of upper-inner quadrant of right female breast: Secondary | ICD-10-CM

## 2019-04-13 DIAGNOSIS — C7951 Secondary malignant neoplasm of bone: Secondary | ICD-10-CM | POA: Insufficient documentation

## 2019-04-13 DIAGNOSIS — C50919 Malignant neoplasm of unspecified site of unspecified female breast: Secondary | ICD-10-CM | POA: Diagnosis not present

## 2019-04-13 DIAGNOSIS — C787 Secondary malignant neoplasm of liver and intrahepatic bile duct: Secondary | ICD-10-CM | POA: Insufficient documentation

## 2019-04-13 DIAGNOSIS — Z170 Estrogen receptor positive status [ER+]: Secondary | ICD-10-CM | POA: Insufficient documentation

## 2019-04-13 MED ORDER — GADOBUTROL 1 MMOL/ML IV SOLN
10.0000 mL | Freq: Once | INTRAVENOUS | Status: AC | PRN
  Administered 2019-04-13: 10 mL via INTRAVENOUS

## 2019-04-13 MED ORDER — TECHNETIUM TC 99M MEDRONATE IV KIT
19.0000 | PACK | Freq: Once | INTRAVENOUS | Status: AC
  Administered 2019-04-13: 19 via INTRAVENOUS

## 2019-04-13 MED FILL — TRIAMTERENE/HCTZ 37.5/25 CP: 37.5-25 | 90 days supply | Qty: 90 | Fill #4

## 2019-04-13 NOTE — Telephone Encounter (Signed)
Reviewed MRI results with patient and that they show further improvement in the liver metastasis and stable osseous mets.  She was recommended to continue current treatment and we will see her back on 04/18/2019.    Wilber Bihari, NP

## 2019-04-17 NOTE — Progress Notes (Signed)
[place]  [phone] [phone]    ID: Debbie [name] DOB: 05-05-1966  MR#: [id number]  RWE#:[id number]  Patient Care Team: Kelton Pillar, MD as PCP - General (Family Medicine) Jacelyn Pi, MD as Consulting Physician (Endocrinology) Jovita Kussmaul, MD as Consulting Physician (General Surgery) Lyne Khurana, Virgie Dad, MD as Consulting Physician (Oncology) [name], MD as Attending Physician (Radiation Oncology) Belva Crome, MD as Consulting Physician (Cardiology) Crissie Reese, MD as Consulting Physician (Plastic Surgery) [name], [name], NP as Nurse Practitioner (Hematology and Oncology) Margarette Canada (Inactive) OTHER MD:   CHIEF COMPLAINT: Estrogen receptor positive stage IV  breast cancer (s/p bilateral mastectomies)  CURRENT TREATMENT:  Anastrozole, affinitor; denosumab/Xgeva   INTERVAL HISTORY: Debbie [name] returns today for follow-up and treatment of her estrogen receptor positive stage IV breast cancer.  She continues on anastrozole.  She obtains this for approximately $5 a month.  She has had no change in hot flashes which were mild to minimal prior to starting the medication.  She does have some vaginal dryness which is not a major concern at present.  She also continues on affinitor.  She has had no mouth sores and is currently not using the Decadron wash or Peridex although she has both available.  She has a little bit of cough in the morning when she brings up phlegm but no cough during the rest of the day.  If she does not take it with food she gets slightly nauseated.  Otherwise she has had no side effects from this medication  She also continues on denosumab/Xgeva every 28 days.  She had her area of bone exposure debrided by Dr. Romie Minus.   She had a brief break on her denosumab but we decided to continue it and she tolerates it well otherwise.  Since her last visit, she underwent liver MRI on 04/13/2019, which showed:  further improvement in the hepatic metastatic disease; stable appearance of the osseous metastatic disease.  She also bone scan the same day, which showed: site of abnormal tracer uptake within multiple vertebra, a lateral right mid rib, right ischium, and proximal left femur consistent with osseous metastases; no new scintigraphic abnormalities.  We follow her CA 27-29 which has been in the normal range Results for Debbie [name], WIKE (MRN [id number]) as of 04/18/2019 08:27  Ref. Range 11/29/2018 08:16 12/27/2018 08:05 01/24/2019 08:05 02/21/2019 08:23 03/21/2019 08:02  CA 27.29 Latest Ref Range: 0.0 - 38.6 U/mL 32.7 34.4 31.5 41.2 (H) 31.0    REVIEW OF SYSTEMS: Sukaina is working up to 50 hours a week.  They are taking appropriate pandemic precautions.  She is wondering if she should receive the vaccine when it becomes available and her family is urging her to not receive it.  She has a little bit of a burning sensation around her bellybutton sometimes, not always.  She has a little catch in her right hip at times.  She has low back pain for which she takes Advil perhaps once every 2 weeks.  Otherwise a detailed review of systems today was stable   BREAST CANCER HISTORY: From the original intake note:  Debbie [name] had screening mammography with tomosynthesis at the [facility] 10/02/2015. This showed 2 possible masses in the right breast as well as some suspicious calcifications. The left breast was unremarkable. On 10/15/2015 she underwent right diagnostic mammography with tomography and right breast ultrasonography. The breast density was category B. Spot magnification views confirmed an area of pleomorphic calcifications measuring up  to 5 cm in the upper half of the breast. Also in the upper outer right breast there were adjacent low-density masses without distortion or calcifications. On physical exam there was no palpable abnormality.  Targeted ultrasound of the right breast found the 2 "masses"  in the right upper quadrant were benign cysts measuring 0.8 and 0.5 cm respectively. Biopsy of the area of calcification on 10/15/2015 showed ([id number]) invasive ductal carcinoma, E-cadherin positive, grade 2, estrogen receptor 90% positive, progesterone receptor 50% positive, both with strong staining intensity, with an MIB-1 of 70%, and no HER-2 amplification, the signals ratio being 1.18 and the number per cell 2.60.  Her subsequent history is as detailed below    PAST MEDICAL HISTORY: Past Medical History:  Diagnosis Date   Anxiety    Arthritis    Bone metastasis ([place])    Breast cancer of upper-inner quadrant of right female breast [facility]) oncologist-  dr [name]--- 04/ 2019 ER/PR+ Stage IV w/ metastatic disease to liver, total spine, lung, bone   dx 06/ 2017  right breast invasive ductal carcinoma, Stage IIB, ER/PR + (pT2 pN1), grade 1-- s/p  right breast lumpecotmy w/ sln bx's and bilateral breast reduction 11-28-2015/  left breast with reduction , dx invasive ductal ca, Grade 1 (pT1cpNX) ER/PR positive/  adjuvant radiation completed 03-27-2016 to right chest wall, 2018  reclassifiec Stage IIA   Cancer, metastatic to liver ([place])    Chronic back pain    Constipation    Depression    Family history of adverse reaction to anesthesia     mother has Copd was kept on vent    Headache    Heart murmur    History of hyperthyroidism    s/p  RAI   History of radiation therapy 02/11/16- 03/27/16   Right Chest Wall 50.4 Gy in 28 fractions, Right Chest Wall Boost 10 Gy in 5 fractions.    Hyperlipidemia    Hypertension    Hypothyroidism, postradioiodine therapy    endocrinoloigst-  dr balan--  2004  s/p  RAI   Menorrhagia 2011   Multiple pulmonary nodules    secondary  metastatic   PONV (postoperative nausea and vomiting)    patch helps    SUI (stress urinary incontinence, female)    Wears contact lenses    Wears dentures    full upper and lower partial    PAST  SURGICAL HISTORY: Past Surgical History:  Procedure Laterality Date   BREAST LUMPECTOMY WITH NEEDLE LOCALIZATION AND AXILLARY SENTINEL LYMPH NODE BX Right 11/28/2015   Procedure: RIGHT BREAST LUMPECTOMY WITH NEEDLE LOCALIZATION AND AXILLARY SENTINEL LYMPH NODE BX;  Surgeon: Autumn Messing III, MD;  Location: [place];  Service: General;  Laterality: Right;   BREAST REDUCTION SURGERY Bilateral 11/28/2015   Procedure: MAMMARY REDUCTION  (BREAST) BILATERAL;  Surgeon: Crissie Reese, MD;  Location: [place];  Service: Plastics;  Laterality: Bilateral;   CESAREAN SECTION  01/12/1993   COLONOSCOPY     Dental surgeries     FEMUR IM NAIL Left 01/28/2018   Procedure: INTRAMEDULLARY (IM) NAIL FEMORAL;  Surgeon: Nicholes Stairs, MD;  Location: [place];  Service: Orthopedics;  Laterality: Left;   HYSTEROSCOPY N/A 09/02/2017   Procedure: HYSTEROSCOPY  to remove IUD;  Surgeon: Eldred Manges, MD;  Location: [place];  Service: Gynecology;  Laterality: N/A;   Liver biospy     MASTECTOMY     MASTECTOMY W/ SENTINEL NODE BIOPSY Bilateral 12/26/2015   Procedure: BILATERAL MASTECTOMY  WITH LEFT SENTINEL LYMPH NODE BIOPSY;  Surgeon: Autumn Messing III, MD;  Location: [place];  Service: General;  Laterality: Bilateral;   Removal of IUD     TUBAL LIGATION Bilateral 1995    FAMILY HISTORY: Family History  Problem Relation Age of Onset   Diabetes Mother    Hypertension Mother    Hypertension Father    Diabetes Maternal Grandmother    Hypertension Sister    Cancer Paternal Grandmother        colon   Colon cancer Paternal Grandmother    Esophageal cancer Neg Hx    Stomach cancer Neg Hx    Rectal cancer Neg Hx    The patient's parents are still living, in their early 58s as of June 2017. The patient has one brother, 2 sisters. On the maternal side there is a history of uterine and prostate cancer. On the paternal side there is a history of colon cancer and possibly uterine cancer.  There is no history of breast or ovarian cancer in the family.    GYNECOLOGIC HISTORY:  No LMP recorded. (Menstrual status: IUD).  Menarche age 29. She still having regular periods. She is GX P1, first pregnancy age 12. She used oral contraceptives for a few years remotely, with no complications. She is currently on a Mirena IUD and also is status post bilateral tubal ligation.   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 Her husband [name] is a Administrator.  They are both taking appropriate precautions at work.  Their son [name], age 74, is a radio DJ.  He no longer lives with the patient.  The patient has no grandchildren. She is not a Ambulance person.   ADVANCED DIRECTIVES: Not in place   HEALTH MAINTENANCE: Social History   Tobacco Use   Smoking status: Never Smoker   Smokeless tobacco: Never Used  Substance Use Topics   Alcohol use: No   Drug use: No     Colonoscopy: January 2016   PAP: January 2016   Bone density: August 2017 showed a T score of +0.7 normal  Lipid panel:  Allergies  Allergen Reactions   Ace Inhibitors Cough   Atorvastatin Other (See Comments)    Joint pain   Simvastatin Other (See Comments)    Joint pain    Current Outpatient Medications  Medication Sig Dispense Refill   AFINITOR 10 MG tablet TAKE 1 TABLET BY MOUTH DAILY. 28 tablet 2   anastrozole (ARIMIDEX) 1 MG tablet Take 1 tablet (1 mg total) by mouth daily. 90 tablet 4   Calcium-Magnesium 250-125 MG TABS Take 1 tablet by mouth daily. 120 each 4   cetirizine (ZYRTEC ALLERGY) 10 MG tablet Take 10 mg by mouth daily as needed for allergies.      chlorhexidine (PERIDEX) 0.12 % solution      dexamethasone (DECADRON) 0.5 MG/5ML solution Swish 59m in mouth for 28m and spit out. Use 4 times daily for 8 weeks, start with Afinitor. Avoid eating/drinking for 1hr after rinse. 500 mL 4   docusate sodium (COLACE) 100 MG capsule Take 200-400  mg by mouth daily.     Ferrous Sulfate (IRON) 325 (65 Fe) MG TABS Take 1 tablet (325 mg total) by mouth daily. 30 each 2   fluconazole (DIFLUCAN) 100 MG tablet Take 1 tablet (100 mg total) by mouth daily. 5 tablet 0   hydrocortisone cream 1 % Apply 1 application topically daily as needed (for rash).  ibuprofen (ADVIL,MOTRIN) 600 MG tablet Take 1 tablet (600 mg total) by mouth every 6 (six) hours as needed for moderate pain. 60 tablet 0   levothyroxine (SYNTHROID, LEVOTHROID) 137 MCG tablet Take 137 mcg by mouth daily before breakfast.      LIVALO 2 MG TABS Take 2 mg by mouth daily.   6   LORazepam (ATIVAN) 0.5 MG tablet Take 1 tablet (0.5 mg total) by mouth at bedtime as needed for anxiety. 30 tablet 0   losartan (COZAAR) 100 MG tablet Take 100 mg by mouth every morning.      Multiple Vitamin (MULTIVITAMIN) tablet Take 1 tablet by mouth daily.     omega-3 acid ethyl esters (LOVAZA) 1 g capsule Take 1 g by mouth 2 (two) times daily.      potassium chloride SA (K-DUR,KLOR-CON) 20 MEQ tablet Take 40 mEq by mouth daily.      pregabalin (LYRICA) 100 MG capsule TAKE 1 CAPSULE BY MOUTH THREE TIMES DAILY 90 capsule 2   prochlorperazine (COMPAZINE) 10 MG tablet Take 1 tablet (10 mg total) by mouth every 6 (six) hours as needed for nausea or vomiting. 30 tablet 0   triamterene-hydrochlorothiazide (DYAZIDE) 37.5-25 MG capsule Take 1 each (1 capsule total) by mouth daily. 90 capsule 4   Vitamin D, Cholecalciferol, 1000 units CAPS Take 1 tablet by mouth daily. (Patient taking differently: Take 1,000 Units by mouth daily. ) 90 capsule 4   No current facility-administered medications for this visit.     OBJECTIVE: Morbidly obese African-American woman in no acute distress  Vitals:   04/18/19 0836  BP: 137/87  Pulse: 92  Resp: 18  Temp: 97.8 F (36.6 C)     Body mass index is 43.58 kg/m.    ECOG FS:1 - Symptomatic but completely ambulatory  Filed Weights   04/18/19 0836  Weight:  274 lb 1.6 oz (124.3 kg)    Sclerae unicteric, EOMs intact Wearing a mask No cervical or supraclavicular adenopathy Lungs no rales or rhonchi Heart regular rate and rhythm Abd soft, nontender, positive bowel sounds MSK no focal spinal tenderness, chronic right upper extremity lymphedema with sleeve in place neuro: nonfocal, well oriented, appropriate affect Breasts: Status post bilateral mastectomies.  There is no evidence of chest wall recurrence.  Both axillae are benign.  LAB RESULTS:  CMP     Component Value Date/Time   NA 142 03/21/2019 0802   NA 140 07/08/2016 0948   K 3.5 03/21/2019 0802   K 4.1 07/08/2016 0948   CL 104 03/21/2019 0802   CO2 24 03/21/2019 0802   CO2 24 07/08/2016 0948   GLUCOSE 120 (H) 03/21/2019 0802   GLUCOSE 89 07/08/2016 0948   BUN 11 03/21/2019 0802   BUN 11.9 07/08/2016 0948   CREATININE 0.85 03/21/2019 0802   CREATININE 0.93 11/01/2018 0742   CREATININE 0.9 07/08/2016 0948   CALCIUM 9.5 03/21/2019 0802   CALCIUM 10.0 07/08/2016 0948   PROT 7.1 03/21/2019 0802   PROT 7.6 07/08/2016 0948   ALBUMIN 3.3 (L) 03/21/2019 0802   ALBUMIN 3.6 07/08/2016 0948   AST 26 03/21/2019 0802   AST 29 11/01/2018 0742   AST 22 07/08/2016 0948   ALT 34 03/21/2019 0802   ALT 35 11/01/2018 0742   ALT 27 07/08/2016 0948   ALKPHOS 147 (H) 03/21/2019 0802   ALKPHOS 115 07/08/2016 0948   BILITOT 0.3 03/21/2019 0802   BILITOT 0.3 11/01/2018 0742   BILITOT 0.35 07/08/2016 0948   GFRNONAA >60  03/21/2019 0802   GFRNONAA >60 11/01/2018 0742   GFRAA >60 03/21/2019 0802   GFRAA >60 11/01/2018 0742    INo results found for: SPEP, UPEP  Lab Results  Component Value Date   WBC 4.5 04/18/2019   NEUTROABS 3.6 04/18/2019   HGB 12.4 04/18/2019   HCT 38.0 04/18/2019   MCV 83.9 04/18/2019   PLT 308 04/18/2019      Chemistry      Component Value Date/Time   NA 142 03/21/2019 0802   NA 140 07/08/2016 0948   K 3.5 03/21/2019 0802   K 4.1 07/08/2016 0948   CL  104 03/21/2019 0802   CO2 24 03/21/2019 0802   CO2 24 07/08/2016 0948   BUN 11 03/21/2019 0802   BUN 11.9 07/08/2016 0948   CREATININE 0.85 03/21/2019 0802   CREATININE 0.93 11/01/2018 0742   CREATININE 0.9 07/08/2016 0948      Component Value Date/Time   CALCIUM 9.5 03/21/2019 0802   CALCIUM 10.0 07/08/2016 0948   ALKPHOS 147 (H) 03/21/2019 0802   ALKPHOS 115 07/08/2016 0948   AST 26 03/21/2019 0802   AST 29 11/01/2018 0742   AST 22 07/08/2016 0948   ALT 34 03/21/2019 0802   ALT 35 11/01/2018 0742   ALT 27 07/08/2016 0948   BILITOT 0.3 03/21/2019 0802   BILITOT 0.3 11/01/2018 0742   BILITOT 0.35 07/08/2016 0948       No results found for: LABCA2  No components found for: [id number]  No results for input(s): INR in the last 168 hours.  Urinalysis No results found for: COLORURINE, APPEARANCEUR, LABSPEC, PHURINE, GLUCOSEU, HGBUR, BILIRUBINUR, KETONESUR, PROTEINUR, UROBILINOGEN, NITRITE, LEUKOCYTESUR   STUDIES: Nm Bone Scan Whole Body  Result Date: 04/13/2019 CLINICAL DATA:  Invasive breast cancer stage IV, hepatic and osseous metastases, rod in LEFT thigh, pins in hip/knee EXAM: NUCLEAR MEDICINE WHOLE BODY BONE SCAN TECHNIQUE: Whole body anterior and posterior images were obtained approximately 3 hours after intravenous injection of radiopharmaceutical. RADIOPHARMACEUTICALS:  19 mCi Technetium-6mMDP IV COMPARISON:  09/13/2018 FINDINGS: Abnormal increased tracer accumulation within the thoracolumbar spine at approximately T12 and L3, RIGHT L5, lateral RIGHT mid rib, and RIGHT ischium concerning for osseous metastases. Increased tracer accumulation at the proximal LEFT femur and to lesser degree distal LEFT femur secondary to LEFT femoral nailing and known metastatic lesions at the intertrochanteric region of the LEFT femur on an earlier CT exam. Uptake at the shoulders and knees typically degenerative. No new sites of abnormal osseous tracer accumulation are identified to suggest  progressive osseous metastatic disease. IMPRESSION: Postsurgical changes at LEFT femur. Sites of abnormal tracer uptake within multiple vertebra, a lateral RIGHT mid rib, RIGHT ischium, and proximal LEFT femur consistent with osseous metastases. No new scintigraphic abnormalities. Electronically Signed   By: MLavonia DanaM.D.   On: 04/13/2019 16:42   Mr Liver W Wo Contrast  Result Date: 04/13/2019 CLINICAL DATA:  Metastatic breast cancer to the liver EXAM: MRI ABDOMEN WITHOUT AND WITH CONTRAST TECHNIQUE: Multiplanar multisequence MR imaging of the abdomen was performed both before and after the administration of intravenous contrast. CONTRAST:  149mGADAVIST GADOBUTROL 1 MMOL/ML IV SOLN COMPARISON:  12/07/2018 FINDINGS: Despite efforts by the technologist and patient, motion artifact is present on today's exam and could not be eliminated. This reduces exam sensitivity and specificity. Lower chest: Mild cardiomegaly. Hepatobiliary: Further improvement noted. Many of the prior nodules have resolved or nearly resolved. Index lesion measures 1.6 by 1.4 cm on image 29/902, and  when measured in a similar fashion on the prior exam measured 1.9 by 1.7 cm by my measurements. The overall appearance is significantly improved in the liver. No new lesions are identified. Mildly contracted gallbladder. Diffuse hepatic steatosis is observed. Pancreas:  Unremarkable Spleen:  Unremarkable Adrenals/Urinary Tract: Small left peripelvic cysts along the lower pole. Otherwise unremarkable. Stomach/Bowel: Unremarkable Vascular/Lymphatic:  Unremarkable Other:  No supplemental non-categorized findings. Musculoskeletal: Stable pattern of flow widespread osseous metastatic disease in the spine. IMPRESSION: 1. Further improvement in the hepatic metastatic disease. 2. Stable appearance of the osseous metastatic disease. 3. Diffuse hepatic steatosis. 4. Mild cardiomegaly. Electronically Signed   By: Van Clines M.D.   On: 04/13/2019  11:44     ELIGIBLE FOR AVAILABLE RESEARCH PROTOCOL: PALLAS, but inelegible because of Mirena IUD   ASSESSMENT: 53 y.o. [place] woman status post right breast upper inner quadrant biopsy 10/15/2015 for a clinical T2-T3 N0, stage II invasive ductal carcinoma, estrogen and progesterone receptor positive, HER-2 nonamplified, with an Mib-1 of 70%  (1) status post right lumpectomy and sentinel lymph node sampling with oncoplastic breast reduction 11/28/2015 for a pT2 pN1, stage IIB invasive ductal carcinoma, grade 1, with negative margins (1/1 sentinel node positive)  (a) reclassified as stage IIA in the 2018 prognostic revision   (2) left reduction mammoplasty 11/28/2015 unexpectedly found a pT1c pNX invasive ductal carcinoma, grade 1, estrogen and progesterone receptor positive, HER-2 not amplified, with an MIB-1 of 3%  (3) Mammaprint from the right sided breast cancer was "low risk", predicting a 98% chance of no disease recurrence within 5 years with anti-estrogens as the only systemic therapy. It also predicts minimal to no benefit from chemotherapy  (4) status post bilateral mastectomies with bilateral sentinel lymph node sampling 12/26/2015, showing  (a) on the right, no residual carcinoma (0/3 nodes involved)  (b) on the left, focal ductal carcinoma in situ, 0.2 cm, with negative margins; (0/4 nodes involved)  (5) adjuvant radiation 02/11/16 - 03/27/16    1) Right chest wall: 50.4 Gy in 28 fractions               2) Right chest wall boost: 10 Gy in 5 fractions   (6) started on tamoxifen neoadjuvantly 10/23/2015 to allow for expected delays in definitive local treatment  (a) Mirena IUD in place  (b) tamoxifen discontinued 08/13/2017, with progression  (7) since she had 4 lymph nodes removed from each axilla but also receive radiation on the right, lab draws should preferentially be obtained from the left arm   METASTATIC DISEASE: April 2019 (8) CT scan of the abdomen and pelvis  08/04/2017, MRI of the liver and total spine MRI 08/11/2017 and CT scan of the chest and brain on 08/13/2017 showed liver and bone metastases  (a) liver biopsy 09/07/2017 confirms estrogen receptor positive, progesterone receptor negative, HER-2 negative metastatic breast cancer  (b) chest CT scan 08/13/2017 shows multiple bilateral pulmonary nodules.  (c) CT of the brain with and without contrast 08/26/2017 shows no evidence of metastatic disease to the brain  (d) CA 27-29 is informative  (9) fulvestrant started 08/17/2017  (a) palbociclib started 08/31/2017  (b) fulvestrant and palbociclib discontinued 01/04/2018 with evidence of progression in the liver  (10) denosumab/Xgeva started 09/14/2017  (a) held after 03/01/2018 dose because concern re. osteonecrosis  (b) resumed 06/09/2018  (c) 02/21/2019 dose held due to oral surgery, resumed 03/21/2019  (11) anastrozole started 01/04/2018  (a) everolimus started 01/29/2018  (b) liver MRI and bone scan December 2019  show stable disease  (c) chest CT scan on 08/02/2018 shows lung lesions resolved, bone lesions stable  (d) bone scan 09/13/2018 essentially stable  (e) liver MRI 12/08/2018 shows improvement in hepatic metastsases  (f) liver MRI and bone scan 04/13/2019 showed continuing improvement in the liver, stable bone lesions  (12) 01/28/18 prophylactic left femur intramedullary nail surgery for impending left femur pathologic fracture  (a) palliative radiation with Dr. [name] to the left femur and left ilium from 02/16/2018 to 03/01/2018  (13) [facility] report on liver biopsy from April 2019 shows a negative PDL 1, stable MSI and proficient mismatch repair status.  The tumor is positive for the androgen receptor, and for PTEN for PIK3 CA mutations.  HER-2/neu was read as equivocal (it was negative by FISH on the pathology report).   PLAN: Caree is now a year and a half out from definitive surgery for breast cancer.  Her disease is well  controlled on her current medications.  This is favorable.  Specifically she has been on anastrozole and everolimus slightly over a year.  Liver MRI continues to show evidence of disease response.  She is not having any significant side effects from these treatments.  She also continues on denosumab.  She has had an area of osteonecrosis which was surgically addressed and we are continuing with those treatments as before.  She will let us know if she notices any change in her mouth or chewing.  She can also get oral ulcers from the everolimus but this has not been a problem so far  She is taking appropriate pandemic precautions  We discussed the upcoming vaccine and I strongly urged her to receive one when it becomes available  She will see me again late March.  She will have a CT of the chest prior to that visit.  I spent approximately 30 minutes face to face with Fujiko with more than 50% of that time spent in counseling and coordination of care.   She knows to call for any other issue that may develop before then. Virgie Dad. Menashe Kafer, MD 04/18/19 9:03 AM Medical Oncology and Hematology [facility] [place], [place] [address] Tel. [phone]    Fax. [phone]   I, Wilburn Mylar, am acting as scribe for Dr. Virgie Dad. Leana Springston.  I, Lurline Del MD, have reviewed the above documentation for accuracy and completeness, and I agree with the above.

## 2019-04-18 ENCOUNTER — Inpatient Hospital Stay: Payer: 59 | Attending: Oncology

## 2019-04-18 ENCOUNTER — Inpatient Hospital Stay (HOSPITAL_BASED_OUTPATIENT_CLINIC_OR_DEPARTMENT_OTHER): Payer: 59 | Admitting: Oncology

## 2019-04-18 ENCOUNTER — Other Ambulatory Visit: Payer: Self-pay

## 2019-04-18 ENCOUNTER — Ambulatory Visit: Payer: 59

## 2019-04-18 ENCOUNTER — Inpatient Hospital Stay: Payer: 59

## 2019-04-18 VITALS — BP 137/87 | HR 92 | Temp 97.8°F | Resp 18 | Ht 66.5 in | Wt 274.1 lb

## 2019-04-18 VITALS — BP 128/78 | HR 88 | Temp 98.2°F | Resp 18

## 2019-04-18 DIAGNOSIS — Z79811 Long term (current) use of aromatase inhibitors: Secondary | ICD-10-CM | POA: Insufficient documentation

## 2019-04-18 DIAGNOSIS — Z923 Personal history of irradiation: Secondary | ICD-10-CM | POA: Diagnosis not present

## 2019-04-18 DIAGNOSIS — C50912 Malignant neoplasm of unspecified site of left female breast: Secondary | ICD-10-CM

## 2019-04-18 DIAGNOSIS — C50112 Malignant neoplasm of central portion of left female breast: Secondary | ICD-10-CM

## 2019-04-18 DIAGNOSIS — Z170 Estrogen receptor positive status [ER+]: Secondary | ICD-10-CM

## 2019-04-18 DIAGNOSIS — M879 Osteonecrosis, unspecified: Secondary | ICD-10-CM | POA: Diagnosis not present

## 2019-04-18 DIAGNOSIS — C787 Secondary malignant neoplasm of liver and intrahepatic bile duct: Secondary | ICD-10-CM | POA: Insufficient documentation

## 2019-04-18 DIAGNOSIS — C50911 Malignant neoplasm of unspecified site of right female breast: Secondary | ICD-10-CM

## 2019-04-18 DIAGNOSIS — C50211 Malignant neoplasm of upper-inner quadrant of right female breast: Secondary | ICD-10-CM

## 2019-04-18 DIAGNOSIS — Z9013 Acquired absence of bilateral breasts and nipples: Secondary | ICD-10-CM | POA: Insufficient documentation

## 2019-04-18 DIAGNOSIS — C7951 Secondary malignant neoplasm of bone: Secondary | ICD-10-CM | POA: Insufficient documentation

## 2019-04-18 DIAGNOSIS — C50012 Malignant neoplasm of nipple and areola, left female breast: Secondary | ICD-10-CM

## 2019-04-18 DIAGNOSIS — C50011 Malignant neoplasm of nipple and areola, right female breast: Secondary | ICD-10-CM

## 2019-04-18 LAB — CBC WITH DIFFERENTIAL/PLATELET
Abs Immature Granulocytes: 0.04 10*3/uL (ref 0.00–0.07)
Basophils Absolute: 0 10*3/uL (ref 0.0–0.1)
Basophils Relative: 0 %
Eosinophils Absolute: 0.2 10*3/uL (ref 0.0–0.5)
Eosinophils Relative: 4 %
HCT: 38 % (ref 36.0–46.0)
Hemoglobin: 12.4 g/dL (ref 12.0–15.0)
Immature Granulocytes: 1 %
Lymphocytes Relative: 10 %
Lymphs Abs: 0.4 10*3/uL — ABNORMAL LOW (ref 0.7–4.0)
MCH: 27.4 pg (ref 26.0–34.0)
MCHC: 32.6 g/dL (ref 30.0–36.0)
MCV: 83.9 fL (ref 80.0–100.0)
Monocytes Absolute: 0.3 10*3/uL (ref 0.1–1.0)
Monocytes Relative: 6 %
Neutro Abs: 3.6 10*3/uL (ref 1.7–7.7)
Neutrophils Relative %: 79 %
Platelets: 308 10*3/uL (ref 150–400)
RBC: 4.53 MIL/uL (ref 3.87–5.11)
RDW: 14.7 % (ref 11.5–15.5)
WBC: 4.5 10*3/uL (ref 4.0–10.5)
nRBC: 0 % (ref 0.0–0.2)

## 2019-04-18 LAB — COMPREHENSIVE METABOLIC PANEL
ALT: 42 U/L (ref 0–44)
AST: 35 U/L (ref 15–41)
Albumin: 3.4 g/dL — ABNORMAL LOW (ref 3.5–5.0)
Alkaline Phosphatase: 154 U/L — ABNORMAL HIGH (ref 38–126)
Anion gap: 11 (ref 5–15)
BUN: 12 mg/dL (ref 6–20)
CO2: 28 mmol/L (ref 22–32)
Calcium: 9.7 mg/dL (ref 8.9–10.3)
Chloride: 103 mmol/L (ref 98–111)
Creatinine, Ser: 0.93 mg/dL (ref 0.44–1.00)
GFR calc Af Amer: >60 mL/min (ref >60)
GFR calc non Af Amer: >60 mL/min (ref >60)
Glucose, Bld: 115 mg/dL — ABNORMAL HIGH (ref 70–99)
Potassium: 3.6 mmol/L (ref 3.5–5.1)
Sodium: 142 mmol/L (ref 135–145)
Total Bilirubin: 0.4 mg/dL (ref 0.3–1.2)
Total Protein: 7.5 g/dL (ref 6.5–8.1)

## 2019-04-18 MED ORDER — DENOSUMAB 120 MG/1.7ML ~~LOC~~ SOLN
120.0000 mg | Freq: Once | SUBCUTANEOUS | Status: AC
  Administered 2019-04-18: 120 mg via SUBCUTANEOUS

## 2019-04-18 MED ORDER — DENOSUMAB 120 MG/1.7ML ~~LOC~~ SOLN
SUBCUTANEOUS | Status: AC
  Filled 2019-04-18: qty 1.7

## 2019-04-18 MED FILL — LEVOTHYROXINE 137 MCG TAB: 137 | 90 days supply | Qty: 90 | Fill #0

## 2019-04-18 NOTE — Patient Instructions (Signed)
Denosumab injection What is this medicine? DENOSUMAB (den oh sue mab) slows bone breakdown. Prolia is used to treat osteoporosis in women after menopause and in men, and in people who are taking corticosteroids for 6 months or more. Xgeva is used to treat a high calcium level due to cancer and to prevent bone fractures and other bone problems caused by multiple myeloma or cancer bone metastases. Xgeva is also used to treat giant cell tumor of the bone. This medicine may be used for other purposes; ask your health care provider or pharmacist if you have questions. COMMON BRAND NAME(S): Prolia, XGEVA What should I tell my health care provider before I take this medicine? They need to know if you have any of these conditions:  dental disease  having surgery or tooth extraction  infection  kidney disease  low levels of calcium or Vitamin D in the blood  malnutrition  on hemodialysis  skin conditions or sensitivity  thyroid or parathyroid disease  an unusual reaction to denosumab, other medicines, foods, dyes, or preservatives  pregnant or trying to get pregnant  breast-feeding How should I use this medicine? This medicine is for injection under the skin. It is given by a health care professional in a hospital or clinic setting. A special MedGuide will be given to you before each treatment. Be sure to read this information carefully each time. For Prolia, talk to your pediatrician regarding the use of this medicine in children. Special care may be needed. For Xgeva, talk to your pediatrician regarding the use of this medicine in children. While this drug may be prescribed for children as young as 13 years for selected conditions, precautions do apply. Overdosage: If you think you have taken too much of this medicine contact a poison control center or emergency room at once. NOTE: This medicine is only for you. Do not share this medicine with others. What if I miss a dose? It is  important not to miss your dose. Call your doctor or health care professional if you are unable to keep an appointment. What may interact with this medicine? Do not take this medicine with any of the following medications:  other medicines containing denosumab This medicine may also interact with the following medications:  medicines that lower your chance of fighting infection  steroid medicines like prednisone or cortisone This list may not describe all possible interactions. Give your health care provider a list of all the medicines, herbs, non-prescription drugs, or dietary supplements you use. Also tell them if you smoke, drink alcohol, or use illegal drugs. Some items may interact with your medicine. What should I watch for while using this medicine? Visit your doctor or health care professional for regular checks on your progress. Your doctor or health care professional may order blood tests and other tests to see how you are doing. Call your doctor or health care professional for advice if you get a fever, chills or sore throat, or other symptoms of a cold or flu. Do not treat yourself. This drug may decrease your body's ability to fight infection. Try to avoid being around people who are sick. You should make sure you get enough calcium and vitamin D while you are taking this medicine, unless your doctor tells you not to. Discuss the foods you eat and the vitamins you take with your health care professional. See your dentist regularly. Brush and floss your teeth as directed. Before you have any dental work done, tell your dentist you are   receiving this medicine. Do not become pregnant while taking this medicine or for 5 months after stopping it. Talk with your doctor or health care professional about your birth control options while taking this medicine. Women should inform their doctor if they wish to become pregnant or think they might be pregnant. There is a potential for serious side  effects to an unborn child. Talk to your health care professional or pharmacist for more information. What side effects may I notice from receiving this medicine? Side effects that you should report to your doctor or health care professional as soon as possible:  allergic reactions like skin rash, itching or hives, swelling of the face, lips, or tongue  bone pain  breathing problems  dizziness  jaw pain, especially after dental work  redness, blistering, peeling of the skin  signs and symptoms of infection like fever or chills; cough; sore throat; pain or trouble passing urine  signs of low calcium like fast heartbeat, muscle cramps or muscle pain; pain, tingling, numbness in the hands or feet; seizures  unusual bleeding or bruising  unusually weak or tired Side effects that usually do not require medical attention (report to your doctor or health care professional if they continue or are bothersome):  constipation  diarrhea  headache  joint pain  loss of appetite  muscle pain  runny nose  tiredness  upset stomach This list may not describe all possible side effects. Call your doctor for medical advice about side effects. You may report side effects to FDA at 1-800-FDA-1088. Where should I keep my medicine? This medicine is only given in a clinic, doctor's office, or other health care setting and will not be stored at home. NOTE: This sheet is a summary. It may not cover all possible information. If you have questions about this medicine, talk to your doctor, pharmacist, or health care provider.  2020 [employer] (2017-09-03 16:10:44)

## 2019-04-19 ENCOUNTER — Telehealth: Payer: Self-pay

## 2019-04-19 ENCOUNTER — Telehealth: Payer: Self-pay | Admitting: Oncology

## 2019-04-19 LAB — CANCER ANTIGEN 27.29: CA 27.29: 28.1 U/mL (ref 0.0–38.6)

## 2019-04-19 NOTE — Telephone Encounter (Signed)
I talk with patient regarding schedule  

## 2019-04-19 NOTE — Telephone Encounter (Signed)
Pt reports having a fall this AM at her workplace.  Pt reports that she was sitting and chair rolled out from behind her.  Pt was able to control fall and landed on sitting position with weight on right side.  Denies hitting head, and denies any injuries.    RN encouraged patient to monitor for abnormal bruising or bleeding, and educated on use of Tylenol for any pain that may develop.  Pt voiced understanding and agreement.  No further needs at this time.

## 2019-05-01 MED FILL — PREGABALIN 100 MG CAPS: 100 | 30 days supply | Qty: 90 | Fill #1

## 2019-05-01 MED FILL — AFINITOR 10 MG TABLET: 10 | 28 days supply | Qty: 28 | Fill #1

## 2019-05-11 MED FILL — LIVALO 2 MG TABLET: 2 | 90 days supply | Qty: 90 | Fill #0

## 2019-05-16 ENCOUNTER — Inpatient Hospital Stay: Payer: 59

## 2019-05-16 ENCOUNTER — Other Ambulatory Visit: Payer: Self-pay

## 2019-05-16 ENCOUNTER — Inpatient Hospital Stay: Payer: 59 | Attending: Oncology

## 2019-05-16 VITALS — BP 167/83 | HR 82 | Temp 97.8°F | Resp 16

## 2019-05-16 DIAGNOSIS — C50211 Malignant neoplasm of upper-inner quadrant of right female breast: Secondary | ICD-10-CM

## 2019-05-16 DIAGNOSIS — Z170 Estrogen receptor positive status [ER+]: Secondary | ICD-10-CM

## 2019-05-16 DIAGNOSIS — Z923 Personal history of irradiation: Secondary | ICD-10-CM | POA: Insufficient documentation

## 2019-05-16 DIAGNOSIS — Z79811 Long term (current) use of aromatase inhibitors: Secondary | ICD-10-CM | POA: Diagnosis not present

## 2019-05-16 DIAGNOSIS — Z9013 Acquired absence of bilateral breasts and nipples: Secondary | ICD-10-CM | POA: Insufficient documentation

## 2019-05-16 DIAGNOSIS — C7951 Secondary malignant neoplasm of bone: Secondary | ICD-10-CM | POA: Insufficient documentation

## 2019-05-16 DIAGNOSIS — C50112 Malignant neoplasm of central portion of left female breast: Secondary | ICD-10-CM

## 2019-05-16 DIAGNOSIS — Z79899 Other long term (current) drug therapy: Secondary | ICD-10-CM | POA: Insufficient documentation

## 2019-05-16 DIAGNOSIS — C787 Secondary malignant neoplasm of liver and intrahepatic bile duct: Secondary | ICD-10-CM | POA: Diagnosis not present

## 2019-05-16 DIAGNOSIS — C50012 Malignant neoplasm of nipple and areola, left female breast: Secondary | ICD-10-CM

## 2019-05-16 DIAGNOSIS — C50011 Malignant neoplasm of nipple and areola, right female breast: Secondary | ICD-10-CM

## 2019-05-16 LAB — CBC WITH DIFFERENTIAL (CANCER CENTER ONLY)
Abs Immature Granulocytes: 0.03 10*3/uL (ref 0.00–0.07)
Basophils Absolute: 0 10*3/uL (ref 0.0–0.1)
Basophils Relative: 0 %
Eosinophils Absolute: 0.2 10*3/uL (ref 0.0–0.5)
Eosinophils Relative: 3 %
HCT: 36.7 % (ref 36.0–46.0)
Hemoglobin: 12.2 g/dL (ref 12.0–15.0)
Immature Granulocytes: 1 %
Lymphocytes Relative: 8 %
Lymphs Abs: 0.4 10*3/uL — ABNORMAL LOW (ref 0.7–4.0)
MCH: 28.1 pg (ref 26.0–34.0)
MCHC: 33.2 g/dL (ref 30.0–36.0)
MCV: 84.6 fL (ref 80.0–100.0)
Monocytes Absolute: 0.4 10*3/uL (ref 0.1–1.0)
Monocytes Relative: 8 %
Neutro Abs: 4.1 10*3/uL (ref 1.7–7.7)
Neutrophils Relative %: 80 %
Platelet Count: 300 10*3/uL (ref 150–400)
RBC: 4.34 MIL/uL (ref 3.87–5.11)
RDW: 14.9 % (ref 11.5–15.5)
WBC Count: 5.1 10*3/uL (ref 4.0–10.5)
nRBC: 0 % (ref 0.0–0.2)

## 2019-05-16 LAB — CMP (CANCER CENTER ONLY)
ALT: 40 U/L (ref 0–44)
AST: 35 U/L (ref 15–41)
Albumin: 3.6 g/dL (ref 3.5–5.0)
Alkaline Phosphatase: 143 U/L — ABNORMAL HIGH (ref 38–126)
Anion gap: 10 (ref 5–15)
BUN: 12 mg/dL (ref 6–20)
CO2: 28 mmol/L (ref 22–32)
Calcium: 9.4 mg/dL (ref 8.9–10.3)
Chloride: 103 mmol/L (ref 98–111)
Creatinine: 0.77 mg/dL (ref 0.44–1.00)
GFR, Est AFR Am: >60 mL/min (ref >60)
GFR, Estimated: >60 mL/min (ref >60)
Glucose, Bld: 139 mg/dL — ABNORMAL HIGH (ref 70–99)
Potassium: 3.7 mmol/L (ref 3.5–5.1)
Sodium: 141 mmol/L (ref 135–145)
Total Bilirubin: 0.7 mg/dL (ref 0.3–1.2)
Total Protein: 7.1 g/dL (ref 6.5–8.1)

## 2019-05-16 MED ORDER — DENOSUMAB 120 MG/1.7ML ~~LOC~~ SOLN
120.0000 mg | Freq: Once | SUBCUTANEOUS | Status: AC
  Administered 2019-05-16: 11:00:00 120 mg via SUBCUTANEOUS

## 2019-05-16 MED ORDER — DENOSUMAB 120 MG/1.7ML ~~LOC~~ SOLN
SUBCUTANEOUS | Status: AC
  Filled 2019-05-16: qty 1.7

## 2019-05-16 NOTE — Patient Instructions (Signed)
Denosumab injection What is this medicine? DENOSUMAB (den oh sue mab) slows bone breakdown. Prolia is used to treat osteoporosis in women after menopause and in men, and in people who are taking corticosteroids for 6 months or more. Xgeva is used to treat a high calcium level due to cancer and to prevent bone fractures and other bone problems caused by multiple myeloma or cancer bone metastases. Xgeva is also used to treat giant cell tumor of the bone. This medicine may be used for other purposes; ask your health care provider or pharmacist if you have questions. COMMON BRAND NAME(S): Prolia, XGEVA What should I tell my health care provider before I take this medicine? They need to know if you have any of these conditions:  dental disease  having surgery or tooth extraction  infection  kidney disease  low levels of calcium or Vitamin D in the blood  malnutrition  on hemodialysis  skin conditions or sensitivity  thyroid or parathyroid disease  an unusual reaction to denosumab, other medicines, foods, dyes, or preservatives  pregnant or trying to get pregnant  breast-feeding How should I use this medicine? This medicine is for injection under the skin. It is given by a health care professional in a hospital or clinic setting. A special MedGuide will be given to you before each treatment. Be sure to read this information carefully each time. For Prolia, talk to your pediatrician regarding the use of this medicine in children. Special care may be needed. For Xgeva, talk to your pediatrician regarding the use of this medicine in children. While this drug may be prescribed for children as young as 13 years for selected conditions, precautions do apply. Overdosage: If you think you have taken too much of this medicine contact a poison control center or emergency room at once. NOTE: This medicine is only for you. Do not share this medicine with others. What if I miss a dose? It is  important not to miss your dose. Call your doctor or health care professional if you are unable to keep an appointment. What may interact with this medicine? Do not take this medicine with any of the following medications:  other medicines containing denosumab This medicine may also interact with the following medications:  medicines that lower your chance of fighting infection  steroid medicines like prednisone or cortisone This list may not describe all possible interactions. Give your health care provider a list of all the medicines, herbs, non-prescription drugs, or dietary supplements you use. Also tell them if you smoke, drink alcohol, or use illegal drugs. Some items may interact with your medicine. What should I watch for while using this medicine? Visit your doctor or health care professional for regular checks on your progress. Your doctor or health care professional may order blood tests and other tests to see how you are doing. Call your doctor or health care professional for advice if you get a fever, chills or sore throat, or other symptoms of a cold or flu. Do not treat yourself. This drug may decrease your body's ability to fight infection. Try to avoid being around people who are sick. You should make sure you get enough calcium and vitamin D while you are taking this medicine, unless your doctor tells you not to. Discuss the foods you eat and the vitamins you take with your health care professional. See your dentist regularly. Brush and floss your teeth as directed. Before you have any dental work done, tell your dentist you are   receiving this medicine. Do not become pregnant while taking this medicine or for 5 months after stopping it. Talk with your doctor or health care professional about your birth control options while taking this medicine. Women should inform their doctor if they wish to become pregnant or think they might be pregnant. There is a potential for serious side  effects to an unborn child. Talk to your health care professional or pharmacist for more information. What side effects may I notice from receiving this medicine? Side effects that you should report to your doctor or health care professional as soon as possible:  allergic reactions like skin rash, itching or hives, swelling of the face, lips, or tongue  bone pain  breathing problems  dizziness  jaw pain, especially after dental work  redness, blistering, peeling of the skin  signs and symptoms of infection like fever or chills; cough; sore throat; pain or trouble passing urine  signs of low calcium like fast heartbeat, muscle cramps or muscle pain; pain, tingling, numbness in the hands or feet; seizures  unusual bleeding or bruising  unusually weak or tired Side effects that usually do not require medical attention (report to your doctor or health care professional if they continue or are bothersome):  constipation  diarrhea  headache  joint pain  loss of appetite  muscle pain  runny nose  tiredness  upset stomach This list may not describe all possible side effects. Call your doctor for medical advice about side effects. You may report side effects to FDA at 1-800-FDA-1088. Where should I keep my medicine? This medicine is only given in a clinic, doctor's office, or other health care setting and will not be stored at home. NOTE: This sheet is a summary. It may not cover all possible information. If you have questions about this medicine, talk to your doctor, pharmacist, or health care provider.  2020 [employer] (2017-09-03 16:10:44)

## 2019-05-17 LAB — CANCER ANTIGEN 27.29: CA 27.29: 29.1 U/mL (ref 0.0–38.6)

## 2019-05-30 MED FILL — PREGABALIN 100 MG CAPS: 100 | 30 days supply | Qty: 90 | Fill #2

## 2019-05-30 MED FILL — AFINITOR 10 MG TABLET: 10 | 28 days supply | Qty: 28 | Fill #2

## 2019-06-13 ENCOUNTER — Inpatient Hospital Stay: Payer: 59

## 2019-06-13 ENCOUNTER — Inpatient Hospital Stay: Payer: 59 | Attending: Oncology

## 2019-06-13 ENCOUNTER — Other Ambulatory Visit: Payer: Self-pay

## 2019-06-13 VITALS — BP 166/81 | HR 102 | Temp 97.8°F | Resp 18

## 2019-06-13 DIAGNOSIS — C7951 Secondary malignant neoplasm of bone: Secondary | ICD-10-CM | POA: Diagnosis not present

## 2019-06-13 DIAGNOSIS — C50211 Malignant neoplasm of upper-inner quadrant of right female breast: Secondary | ICD-10-CM | POA: Diagnosis not present

## 2019-06-13 DIAGNOSIS — Z170 Estrogen receptor positive status [ER+]: Secondary | ICD-10-CM | POA: Insufficient documentation

## 2019-06-13 DIAGNOSIS — Z79811 Long term (current) use of aromatase inhibitors: Secondary | ICD-10-CM | POA: Diagnosis not present

## 2019-06-13 DIAGNOSIS — C50011 Malignant neoplasm of nipple and areola, right female breast: Secondary | ICD-10-CM

## 2019-06-13 DIAGNOSIS — C787 Secondary malignant neoplasm of liver and intrahepatic bile duct: Secondary | ICD-10-CM | POA: Insufficient documentation

## 2019-06-13 DIAGNOSIS — C50012 Malignant neoplasm of nipple and areola, left female breast: Secondary | ICD-10-CM

## 2019-06-13 DIAGNOSIS — C50112 Malignant neoplasm of central portion of left female breast: Secondary | ICD-10-CM

## 2019-06-13 LAB — CBC WITH DIFFERENTIAL/PLATELET
Abs Immature Granulocytes: 0.03 10*3/uL (ref 0.00–0.07)
Basophils Absolute: 0 10*3/uL (ref 0.0–0.1)
Basophils Relative: 1 %
Eosinophils Absolute: 0.1 10*3/uL (ref 0.0–0.5)
Eosinophils Relative: 3 %
HCT: 38.8 % (ref 36.0–46.0)
Hemoglobin: 12.4 g/dL (ref 12.0–15.0)
Immature Granulocytes: 1 %
Lymphocytes Relative: 8 %
Lymphs Abs: 0.4 10*3/uL — ABNORMAL LOW (ref 0.7–4.0)
MCH: 27.6 pg (ref 26.0–34.0)
MCHC: 32 g/dL (ref 30.0–36.0)
MCV: 86.4 fL (ref 80.0–100.0)
Monocytes Absolute: 0.3 10*3/uL (ref 0.1–1.0)
Monocytes Relative: 6 %
Neutro Abs: 3.8 10*3/uL (ref 1.7–7.7)
Neutrophils Relative %: 81 %
Platelets: 307 10*3/uL (ref 150–400)
RBC: 4.49 MIL/uL (ref 3.87–5.11)
RDW: 14.9 % (ref 11.5–15.5)
WBC: 4.6 10*3/uL (ref 4.0–10.5)
nRBC: 0 % (ref 0.0–0.2)

## 2019-06-13 LAB — COMPREHENSIVE METABOLIC PANEL
ALT: 44 U/L (ref 0–44)
AST: 36 U/L (ref 15–41)
Albumin: 3.4 g/dL — ABNORMAL LOW (ref 3.5–5.0)
Alkaline Phosphatase: 180 U/L — ABNORMAL HIGH (ref 38–126)
Anion gap: 11 (ref 5–15)
BUN: 11 mg/dL (ref 6–20)
CO2: 26 mmol/L (ref 22–32)
Calcium: 9.2 mg/dL (ref 8.9–10.3)
Chloride: 105 mmol/L (ref 98–111)
Creatinine, Ser: 0.97 mg/dL (ref 0.44–1.00)
GFR calc Af Amer: >60 mL/min (ref >60)
GFR calc non Af Amer: >60 mL/min (ref >60)
Glucose, Bld: 147 mg/dL — ABNORMAL HIGH (ref 70–99)
Potassium: 3.3 mmol/L — ABNORMAL LOW (ref 3.5–5.1)
Sodium: 142 mmol/L (ref 135–145)
Total Bilirubin: 0.3 mg/dL (ref 0.3–1.2)
Total Protein: 7.4 g/dL (ref 6.5–8.1)

## 2019-06-13 MED ORDER — DENOSUMAB 120 MG/1.7ML ~~LOC~~ SOLN
SUBCUTANEOUS | Status: AC
  Filled 2019-06-13: qty 1.7

## 2019-06-13 MED ORDER — DENOSUMAB 120 MG/1.7ML ~~LOC~~ SOLN
120.0000 mg | Freq: Once | SUBCUTANEOUS | Status: AC
  Administered 2019-06-13: 120 mg via SUBCUTANEOUS

## 2019-06-13 NOTE — Patient Instructions (Signed)
Denosumab injection What is this medicine? DENOSUMAB (den oh sue mab) slows bone breakdown. Prolia is used to treat osteoporosis in women after menopause and in men, and in people who are taking corticosteroids for 6 months or more. Xgeva is used to treat a high calcium level due to cancer and to prevent bone fractures and other bone problems caused by multiple myeloma or cancer bone metastases. Xgeva is also used to treat giant cell tumor of the bone. This medicine may be used for other purposes; ask your health care provider or pharmacist if you have questions. COMMON BRAND NAME(S): Prolia, XGEVA What should I tell my health care provider before I take this medicine? They need to know if you have any of these conditions:  dental disease  having surgery or tooth extraction  infection  kidney disease  low levels of calcium or Vitamin D in the blood  malnutrition  on hemodialysis  skin conditions or sensitivity  thyroid or parathyroid disease  an unusual reaction to denosumab, other medicines, foods, dyes, or preservatives  pregnant or trying to get pregnant  breast-feeding How should I use this medicine? This medicine is for injection under the skin. It is given by a health care professional in a hospital or clinic setting. A special MedGuide will be given to you before each treatment. Be sure to read this information carefully each time. For Prolia, talk to your pediatrician regarding the use of this medicine in children. Special care may be needed. For Xgeva, talk to your pediatrician regarding the use of this medicine in children. While this drug may be prescribed for children as young as 13 years for selected conditions, precautions do apply. Overdosage: If you think you have taken too much of this medicine contact a poison control center or emergency room at once. NOTE: This medicine is only for you. Do not share this medicine with others. What if I miss a dose? It is  important not to miss your dose. Call your doctor or health care professional if you are unable to keep an appointment. What may interact with this medicine? Do not take this medicine with any of the following medications:  other medicines containing denosumab This medicine may also interact with the following medications:  medicines that lower your chance of fighting infection  steroid medicines like prednisone or cortisone This list may not describe all possible interactions. Give your health care provider a list of all the medicines, herbs, non-prescription drugs, or dietary supplements you use. Also tell them if you smoke, drink alcohol, or use illegal drugs. Some items may interact with your medicine. What should I watch for while using this medicine? Visit your doctor or health care professional for regular checks on your progress. Your doctor or health care professional may order blood tests and other tests to see how you are doing. Call your doctor or health care professional for advice if you get a fever, chills or sore throat, or other symptoms of a cold or flu. Do not treat yourself. This drug may decrease your body's ability to fight infection. Try to avoid being around people who are sick. You should make sure you get enough calcium and vitamin D while you are taking this medicine, unless your doctor tells you not to. Discuss the foods you eat and the vitamins you take with your health care professional. See your dentist regularly. Brush and floss your teeth as directed. Before you have any dental work done, tell your dentist you are   receiving this medicine. Do not become pregnant while taking this medicine or for 5 months after stopping it. Talk with your doctor or health care professional about your birth control options while taking this medicine. Women should inform their doctor if they wish to become pregnant or think they might be pregnant. There is a potential for serious side  effects to an unborn child. Talk to your health care professional or pharmacist for more information. What side effects may I notice from receiving this medicine? Side effects that you should report to your doctor or health care professional as soon as possible:  allergic reactions like skin rash, itching or hives, swelling of the face, lips, or tongue  bone pain  breathing problems  dizziness  jaw pain, especially after dental work  redness, blistering, peeling of the skin  signs and symptoms of infection like fever or chills; cough; sore throat; pain or trouble passing urine  signs of low calcium like fast heartbeat, muscle cramps or muscle pain; pain, tingling, numbness in the hands or feet; seizures  unusual bleeding or bruising  unusually weak or tired Side effects that usually do not require medical attention (report to your doctor or health care professional if they continue or are bothersome):  constipation  diarrhea  headache  joint pain  loss of appetite  muscle pain  runny nose  tiredness  upset stomach This list may not describe all possible side effects. Call your doctor for medical advice about side effects. You may report side effects to FDA at 1-800-FDA-1088. Where should I keep my medicine? This medicine is only given in a clinic, doctor's office, or other health care setting and will not be stored at home. NOTE: This sheet is a summary. It may not cover all possible information. If you have questions about this medicine, talk to your doctor, pharmacist, or health care provider.  2020 [employer] (2017-09-03 16:10:44)

## 2019-06-14 LAB — CANCER ANTIGEN 27.29: CA 27.29: 27.2 U/mL (ref 0.0–38.6)

## 2019-06-19 DIAGNOSIS — C7951 Secondary malignant neoplasm of bone: Secondary | ICD-10-CM | POA: Diagnosis not present

## 2019-06-19 DIAGNOSIS — E039 Hypothyroidism, unspecified: Secondary | ICD-10-CM | POA: Diagnosis not present

## 2019-06-19 DIAGNOSIS — Z0000 Encounter for general adult medical examination without abnormal findings: Secondary | ICD-10-CM | POA: Diagnosis not present

## 2019-06-19 DIAGNOSIS — G620 Drug-induced polyneuropathy: Secondary | ICD-10-CM | POA: Diagnosis not present

## 2019-06-19 DIAGNOSIS — C787 Secondary malignant neoplasm of liver and intrahepatic bile duct: Secondary | ICD-10-CM | POA: Diagnosis not present

## 2019-06-19 DIAGNOSIS — D0510 Intraductal carcinoma in situ of unspecified breast: Secondary | ICD-10-CM | POA: Diagnosis not present

## 2019-06-19 DIAGNOSIS — I10 Essential (primary) hypertension: Secondary | ICD-10-CM | POA: Diagnosis not present

## 2019-06-19 DIAGNOSIS — F39 Unspecified mood [affective] disorder: Secondary | ICD-10-CM | POA: Diagnosis not present

## 2019-06-19 DIAGNOSIS — E7800 Pure hypercholesterolemia, unspecified: Secondary | ICD-10-CM | POA: Diagnosis not present

## 2019-06-21 ENCOUNTER — Other Ambulatory Visit: Payer: Self-pay | Admitting: Oncology

## 2019-06-21 DIAGNOSIS — Z170 Estrogen receptor positive status [ER+]: Secondary | ICD-10-CM

## 2019-06-21 DIAGNOSIS — C50211 Malignant neoplasm of upper-inner quadrant of right female breast: Secondary | ICD-10-CM

## 2019-06-22 DIAGNOSIS — R7303 Prediabetes: Secondary | ICD-10-CM | POA: Diagnosis not present

## 2019-06-22 MED FILL — ROSUVASTATIN CALCIUM 5 MG T: 5 | 30 days supply | Qty: 30 | Fill #0

## 2019-06-22 MED FILL — SERTRALINE HCL 50 MG TABLET: 50 | 30 days supply | Qty: 30 | Fill #0

## 2019-06-26 MED FILL — ANASTROZOLE 1 MG TABLET: 1 | 90 days supply | Qty: 90 | Fill #0

## 2019-06-26 MED FILL — AFINITOR 10 MG TABLET: 10 | 28 days supply | Qty: 28 | Fill #0

## 2019-06-26 MED FILL — LOSARTAN POTASSIUM 100 MG T: 100 | 90 days supply | Qty: 90 | Fill #1

## 2019-07-11 ENCOUNTER — Inpatient Hospital Stay: Payer: 59

## 2019-07-11 ENCOUNTER — Other Ambulatory Visit: Payer: Self-pay

## 2019-07-11 ENCOUNTER — Inpatient Hospital Stay: Payer: 59 | Attending: Oncology

## 2019-07-11 VITALS — BP 159/88 | HR 88 | Temp 97.8°F | Resp 20

## 2019-07-11 DIAGNOSIS — Z170 Estrogen receptor positive status [ER+]: Secondary | ICD-10-CM

## 2019-07-11 DIAGNOSIS — C50012 Malignant neoplasm of nipple and areola, left female breast: Secondary | ICD-10-CM

## 2019-07-11 DIAGNOSIS — Z79811 Long term (current) use of aromatase inhibitors: Secondary | ICD-10-CM | POA: Diagnosis not present

## 2019-07-11 DIAGNOSIS — Z79899 Other long term (current) drug therapy: Secondary | ICD-10-CM | POA: Insufficient documentation

## 2019-07-11 DIAGNOSIS — C50211 Malignant neoplasm of upper-inner quadrant of right female breast: Secondary | ICD-10-CM

## 2019-07-11 DIAGNOSIS — C7951 Secondary malignant neoplasm of bone: Secondary | ICD-10-CM | POA: Insufficient documentation

## 2019-07-11 DIAGNOSIS — C50112 Malignant neoplasm of central portion of left female breast: Secondary | ICD-10-CM

## 2019-07-11 DIAGNOSIS — C50011 Malignant neoplasm of nipple and areola, right female breast: Secondary | ICD-10-CM

## 2019-07-11 LAB — CBC WITH DIFFERENTIAL/PLATELET
Abs Immature Granulocytes: 0.03 10*3/uL (ref 0.00–0.07)
Basophils Absolute: 0 10*3/uL (ref 0.0–0.1)
Basophils Relative: 1 %
Eosinophils Absolute: 0.1 10*3/uL (ref 0.0–0.5)
Eosinophils Relative: 3 %
HCT: 37.1 % (ref 36.0–46.0)
Hemoglobin: 12 g/dL (ref 12.0–15.0)
Immature Granulocytes: 1 %
Lymphocytes Relative: 12 %
Lymphs Abs: 0.5 10*3/uL — ABNORMAL LOW (ref 0.7–4.0)
MCH: 27.1 pg (ref 26.0–34.0)
MCHC: 32.3 g/dL (ref 30.0–36.0)
MCV: 83.7 fL (ref 80.0–100.0)
Monocytes Absolute: 0.4 10*3/uL (ref 0.1–1.0)
Monocytes Relative: 9 %
Neutro Abs: 3.3 10*3/uL (ref 1.7–7.7)
Neutrophils Relative %: 74 %
Platelets: 288 10*3/uL (ref 150–400)
RBC: 4.43 MIL/uL (ref 3.87–5.11)
RDW: 15.1 % (ref 11.5–15.5)
WBC: 4.4 10*3/uL (ref 4.0–10.5)
nRBC: 0 % (ref 0.0–0.2)

## 2019-07-11 LAB — COMPREHENSIVE METABOLIC PANEL
ALT: 34 U/L (ref 0–44)
AST: 30 U/L (ref 15–41)
Albumin: 3.3 g/dL — ABNORMAL LOW (ref 3.5–5.0)
Alkaline Phosphatase: 173 U/L — ABNORMAL HIGH (ref 38–126)
Anion gap: 10 (ref 5–15)
BUN: 10 mg/dL (ref 6–20)
CO2: 27 mmol/L (ref 22–32)
Calcium: 9 mg/dL (ref 8.9–10.3)
Chloride: 105 mmol/L (ref 98–111)
Creatinine, Ser: 0.84 mg/dL (ref 0.44–1.00)
GFR calc Af Amer: >60 mL/min (ref >60)
GFR calc non Af Amer: >60 mL/min (ref >60)
Glucose, Bld: 105 mg/dL — ABNORMAL HIGH (ref 70–99)
Potassium: 3.6 mmol/L (ref 3.5–5.1)
Sodium: 142 mmol/L (ref 135–145)
Total Bilirubin: 0.3 mg/dL (ref 0.3–1.2)
Total Protein: 7.5 g/dL (ref 6.5–8.1)

## 2019-07-11 MED ORDER — DENOSUMAB 120 MG/1.7ML ~~LOC~~ SOLN
120.0000 mg | Freq: Once | SUBCUTANEOUS | Status: AC
  Administered 2019-07-11: 120 mg via SUBCUTANEOUS

## 2019-07-11 MED ORDER — DENOSUMAB 120 MG/1.7ML ~~LOC~~ SOLN
SUBCUTANEOUS | Status: AC
  Filled 2019-07-11: qty 1.7

## 2019-07-11 NOTE — Patient Instructions (Signed)
Denosumab injection What is this medicine? DENOSUMAB (den oh sue mab) slows bone breakdown. Prolia is used to treat osteoporosis in women after menopause and in men, and in people who are taking corticosteroids for 6 months or more. Xgeva is used to treat a high calcium level due to cancer and to prevent bone fractures and other bone problems caused by multiple myeloma or cancer bone metastases. Xgeva is also used to treat giant cell tumor of the bone. This medicine may be used for other purposes; ask your health care provider or pharmacist if you have questions. COMMON BRAND NAME(S): Prolia, XGEVA What should I tell my health care provider before I take this medicine? They need to know if you have any of these conditions:  dental disease  having surgery or tooth extraction  infection  kidney disease  low levels of calcium or Vitamin D in the blood  malnutrition  on hemodialysis  skin conditions or sensitivity  thyroid or parathyroid disease  an unusual reaction to denosumab, other medicines, foods, dyes, or preservatives  pregnant or trying to get pregnant  breast-feeding How should I use this medicine? This medicine is for injection under the skin. It is given by a health care professional in a hospital or clinic setting. A special MedGuide will be given to you before each treatment. Be sure to read this information carefully each time. For Prolia, talk to your pediatrician regarding the use of this medicine in children. Special care may be needed. For Xgeva, talk to your pediatrician regarding the use of this medicine in children. While this drug may be prescribed for children as young as 13 years for selected conditions, precautions do apply. Overdosage: If you think you have taken too much of this medicine contact a poison control center or emergency room at once. NOTE: This medicine is only for you. Do not share this medicine with others. What if I miss a dose? It is  important not to miss your dose. Call your doctor or health care professional if you are unable to keep an appointment. What may interact with this medicine? Do not take this medicine with any of the following medications:  other medicines containing denosumab This medicine may also interact with the following medications:  medicines that lower your chance of fighting infection  steroid medicines like prednisone or cortisone This list may not describe all possible interactions. Give your health care provider a list of all the medicines, herbs, non-prescription drugs, or dietary supplements you use. Also tell them if you smoke, drink alcohol, or use illegal drugs. Some items may interact with your medicine. What should I watch for while using this medicine? Visit your doctor or health care professional for regular checks on your progress. Your doctor or health care professional may order blood tests and other tests to see how you are doing. Call your doctor or health care professional for advice if you get a fever, chills or sore throat, or other symptoms of a cold or flu. Do not treat yourself. This drug may decrease your body's ability to fight infection. Try to avoid being around people who are sick. You should make sure you get enough calcium and vitamin D while you are taking this medicine, unless your doctor tells you not to. Discuss the foods you eat and the vitamins you take with your health care professional. See your dentist regularly. Brush and floss your teeth as directed. Before you have any dental work done, tell your dentist you are   receiving this medicine. Do not become pregnant while taking this medicine or for 5 months after stopping it. Talk with your doctor or health care professional about your birth control options while taking this medicine. Women should inform their doctor if they wish to become pregnant or think they might be pregnant. There is a potential for serious side  effects to an unborn child. Talk to your health care professional or pharmacist for more information. What side effects may I notice from receiving this medicine? Side effects that you should report to your doctor or health care professional as soon as possible:  allergic reactions like skin rash, itching or hives, swelling of the face, lips, or tongue  bone pain  breathing problems  dizziness  jaw pain, especially after dental work  redness, blistering, peeling of the skin  signs and symptoms of infection like fever or chills; cough; sore throat; pain or trouble passing urine  signs of low calcium like fast heartbeat, muscle cramps or muscle pain; pain, tingling, numbness in the hands or feet; seizures  unusual bleeding or bruising  unusually weak or tired Side effects that usually do not require medical attention (report to your doctor or health care professional if they continue or are bothersome):  constipation  diarrhea  headache  joint pain  loss of appetite  muscle pain  runny nose  tiredness  upset stomach This list may not describe all possible side effects. Call your doctor for medical advice about side effects. You may report side effects to FDA at 1-800-FDA-1088. Where should I keep my medicine? This medicine is only given in a clinic, doctor's office, or other health care setting and will not be stored at home. NOTE: This sheet is a summary. It may not cover all possible information. If you have questions about this medicine, talk to your doctor, pharmacist, or health care provider.  2020 [employer] (2017-09-03 16:10:44)

## 2019-07-12 LAB — CANCER ANTIGEN 27.29: CA 27.29: 31.9 U/mL (ref 0.0–38.6)

## 2019-07-18 ENCOUNTER — Other Ambulatory Visit: Payer: Self-pay | Admitting: *Deleted

## 2019-07-18 ENCOUNTER — Other Ambulatory Visit: Payer: Self-pay | Admitting: Oncology

## 2019-07-18 MED ORDER — PREGABALIN 100 MG PO CAPS: 100.0000 mg | ORAL_CAPSULE | Freq: Three times a day (TID) | ORAL | 2 refills | Status: DC

## 2019-07-18 MED FILL — LEVOTHYROXINE 137 MCG TAB: 137 | 90 days supply | Qty: 90 | Fill #0

## 2019-07-18 MED FILL — SERTRALINE HCL 50 MG TABLET: 50 | 30 days supply | Qty: 30 | Fill #1

## 2019-07-18 MED FILL — TRIAMTERENE/HCTZ 37.5/25 CP: 37.5-25 | 90 days supply | Qty: 90 | Fill #0

## 2019-07-18 MED FILL — ROSUVASTATIN CALCIUM 5 MG T: 5 | 30 days supply | Qty: 30 | Fill #1

## 2019-07-18 MED FILL — PREGABALIN 100 MG CAPS: 100 | 30 days supply | Qty: 90 | Fill #0

## 2019-07-20 ENCOUNTER — Other Ambulatory Visit (HOSPITAL_COMMUNITY): Payer: Self-pay | Admitting: Family Medicine

## 2019-07-20 DIAGNOSIS — E7800 Pure hypercholesterolemia, unspecified: Secondary | ICD-10-CM | POA: Diagnosis not present

## 2019-07-20 DIAGNOSIS — F39 Unspecified mood [affective] disorder: Secondary | ICD-10-CM | POA: Diagnosis not present

## 2019-07-20 DIAGNOSIS — I10 Essential (primary) hypertension: Secondary | ICD-10-CM | POA: Diagnosis not present

## 2019-07-20 MED FILL — SERTRALINE HCL 100 MG TAB: 100 | 30 days supply | Qty: 30 | Fill #0

## 2019-07-21 MED FILL — AFINITOR 10 MG TABLET: 10 | 28 days supply | Qty: 28 | Fill #1

## 2019-08-01 ENCOUNTER — Encounter (HOSPITAL_COMMUNITY): Payer: Self-pay

## 2019-08-01 ENCOUNTER — Other Ambulatory Visit: Payer: Self-pay

## 2019-08-01 ENCOUNTER — Ambulatory Visit (HOSPITAL_COMMUNITY)
Admission: RE | Admit: 2019-08-01 | Discharge: 2019-08-01 | Disposition: A | Discharge status: Home / Self Care | Payer: 59 | Source: Ambulatory Visit | Attending: Oncology | Admitting: Oncology

## 2019-08-01 DIAGNOSIS — R911 Solitary pulmonary nodule: Secondary | ICD-10-CM | POA: Diagnosis not present

## 2019-08-01 DIAGNOSIS — C50912 Malignant neoplasm of unspecified site of left female breast: Secondary | ICD-10-CM | POA: Diagnosis not present

## 2019-08-01 DIAGNOSIS — C7951 Secondary malignant neoplasm of bone: Secondary | ICD-10-CM | POA: Diagnosis not present

## 2019-08-01 DIAGNOSIS — Z170 Estrogen receptor positive status [ER+]: Secondary | ICD-10-CM | POA: Insufficient documentation

## 2019-08-01 DIAGNOSIS — C50112 Malignant neoplasm of central portion of left female breast: Secondary | ICD-10-CM | POA: Diagnosis not present

## 2019-08-01 DIAGNOSIS — M879 Osteonecrosis, unspecified: Secondary | ICD-10-CM | POA: Diagnosis not present

## 2019-08-01 DIAGNOSIS — C50911 Malignant neoplasm of unspecified site of right female breast: Secondary | ICD-10-CM | POA: Diagnosis not present

## 2019-08-01 DIAGNOSIS — C50919 Malignant neoplasm of unspecified site of unspecified female breast: Secondary | ICD-10-CM | POA: Diagnosis not present

## 2019-08-01 DIAGNOSIS — C787 Secondary malignant neoplasm of liver and intrahepatic bile duct: Secondary | ICD-10-CM | POA: Diagnosis not present

## 2019-08-01 DIAGNOSIS — C50211 Malignant neoplasm of upper-inner quadrant of right female breast: Secondary | ICD-10-CM | POA: Insufficient documentation

## 2019-08-01 MED ORDER — IOHEXOL 300 MG/ML  SOLN
75.0000 mL | Freq: Once | INTRAMUSCULAR | Status: AC | PRN
  Administered 2019-08-01: 75 mL via INTRAVENOUS

## 2019-08-01 MED ORDER — SODIUM CHLORIDE (PF) 0.9 % IJ SOLN
INTRAMUSCULAR | Status: AC
  Filled 2019-08-01: qty 50

## 2019-08-03 ENCOUNTER — Encounter: Payer: Self-pay | Admitting: Oncology

## 2019-08-07 NOTE — Progress Notes (Signed)
[place]  [phone] [phone]    ID: Debbie [name] DOB: 02-07-1966  MR#: [id number]  AVW#:[id number]  Patient Care Team: Kelton Pillar, MD as PCP - General (Family Medicine) Jacelyn Pi, MD as Consulting Physician (Endocrinology) Jovita Kussmaul, MD as Consulting Physician (General Surgery) Charonda Hefter, Virgie Dad, MD as Consulting Physician (Oncology) [name], MD as Attending Physician (Radiation Oncology) Belva Crome, MD as Consulting Physician (Cardiology) Crissie Reese, MD as Consulting Physician (Plastic Surgery) [name], [name], NP as Nurse Practitioner (Hematology and Oncology) Margarette Canada (Inactive) Lajoyce Lauber, DMD (Dentistry) OTHER MD:   CHIEF COMPLAINT: Estrogen receptor positive stage IV  breast cancer (s/p bilateral mastectomies)  CURRENT TREATMENT:  Anastrozole, affinitor; denosumab/Xgeva   INTERVAL HISTORY: Debbie [name] returns today for follow-up and treatment of her estrogen receptor positive stage IV breast cancer.  She continues on anastrozole.  She does very well with this medicine, with no significant hot flashes although she tells me now that the weather is warming up that can become a little bit more of an issue.  She also obtains it under very good price.  She also continues on affinitor.  She has had no mouth sores with this, no nausea or fatigue.  She has no significant cough shortness of breath or pleurisy.  She also continues on denosumab/Xgeva every 28 days.  She had her area of bone exposure debrided by Dr. Romie Minus.   She had a brief break on her denosumab but we decided to continue it and she tolerates it well otherwise.  Since her last visit, she underwent restaging chest CT on 08/01/2019, which was stable, with no evidence of active disease.  We follow her CA 27-29 which has been in the normal range Lab Results  Component Value Date   CA2729 31.9 07/11/2019   CA2729 27.2 06/13/2019     CA2729 29.1 05/16/2019   CA2729 28.1 04/18/2019   CA2729 31.0 03/21/2019    REVIEW OF SYSTEMS: Debbie [name] continues to work full-time.  She has received both her Covid vaccine shots and did very well with those.  She has been changed to a new statin, namely rosuvastatin and so far she is tolerating it well.  She was also started on Zoloft by her primary care because the patient was feeling a little bit down (her mother had Covid in [place] and she could not visit).  [place] is losing a little bit of weight and she is happy about that.  She walks in place at work and is doing some stretching exercises.  Detailed review of systems today was otherwise noncontributory   BREAST CANCER HISTORY: From the original intake note:  Navjot had screening mammography with tomosynthesis at the [facility] 10/02/2015. This showed 2 possible masses in the right breast as well as some suspicious calcifications. The left breast was unremarkable. On 10/15/2015 she underwent right diagnostic mammography with tomography and right breast ultrasonography. The breast density was category B. Spot magnification views confirmed an area of pleomorphic calcifications measuring up to 5 cm in the upper half of the breast. Also in the upper outer right breast there were adjacent low-density masses without distortion or calcifications. On physical exam there was no palpable abnormality.  Targeted ultrasound of the right breast found the 2 "masses" in the right upper quadrant were benign cysts measuring 0.8 and 0.5 cm respectively. Biopsy of the area of calcification on 10/15/2015 showed ([id number]) invasive ductal carcinoma, E-cadherin positive, grade 2, estrogen receptor 90%  positive, progesterone receptor 50% positive, both with strong staining intensity, with an MIB-1 of 70%, and no HER-2 amplification, the signals ratio being 1.18 and the number per cell 2.60.  Her subsequent history is as detailed below    PAST MEDICAL  HISTORY: Past Medical History:  Diagnosis Date   Anxiety    Arthritis    Bone metastasis ([place])    Breast cancer of upper-inner quadrant of right female breast [facility]) oncologist-  dr [name]--- 04/ 2019 ER/PR+ Stage IV w/ metastatic disease to liver, total spine, lung, bone   dx 06/ 2017  right breast invasive ductal carcinoma, Stage IIB, ER/PR + (pT2 pN1), grade 1-- s/p  right breast lumpecotmy w/ sln bx's and bilateral breast reduction 11-28-2015/  left breast with reduction , dx invasive ductal ca, Grade 1 (pT1cpNX) ER/PR positive/  adjuvant radiation completed 03-27-2016 to right chest wall, 2018  reclassifiec Stage IIA   Cancer, metastatic to liver ([place])    Chronic back pain    Constipation    Depression    Family history of adverse reaction to anesthesia     mother has Copd was kept on vent    Headache    Heart murmur    History of hyperthyroidism    s/p  RAI   History of radiation therapy 02/11/16- 03/27/16   Right Chest Wall 50.4 Gy in 28 fractions, Right Chest Wall Boost 10 Gy in 5 fractions.    Hyperlipidemia    Hypertension    Hypothyroidism, postradioiodine therapy    endocrinoloigst-  dr balan--  2004  s/p  RAI   Menorrhagia 2011   Multiple pulmonary nodules    secondary  metastatic   PONV (postoperative nausea and vomiting)    patch helps    SUI (stress urinary incontinence, female)    Wears contact lenses    Wears dentures    full upper and lower partial    PAST SURGICAL HISTORY: Past Surgical History:  Procedure Laterality Date   BREAST LUMPECTOMY WITH NEEDLE LOCALIZATION AND AXILLARY SENTINEL LYMPH NODE BX Right 11/28/2015   Procedure: RIGHT BREAST LUMPECTOMY WITH NEEDLE LOCALIZATION AND AXILLARY SENTINEL LYMPH NODE BX;  Surgeon: Autumn Messing III, MD;  Location: [place];  Service: General;  Laterality: Right;   BREAST REDUCTION SURGERY Bilateral 11/28/2015   Procedure: MAMMARY REDUCTION  (BREAST) BILATERAL;  Surgeon: Crissie Reese, MD;   Location: [place];  Service: Plastics;  Laterality: Bilateral;   CESAREAN SECTION  01/12/1993   COLONOSCOPY     Dental surgeries     FEMUR IM NAIL Left 01/28/2018   Procedure: INTRAMEDULLARY (IM) NAIL FEMORAL;  Surgeon: Nicholes Stairs, MD;  Location: [place];  Service: Orthopedics;  Laterality: Left;   HYSTEROSCOPY N/A 09/02/2017   Procedure: HYSTEROSCOPY  to remove IUD;  Surgeon: Eldred Manges, MD;  Location: [place];  Service: Gynecology;  Laterality: N/A;   Liver biospy     MASTECTOMY     MASTECTOMY W/ SENTINEL NODE BIOPSY Bilateral 12/26/2015   Procedure: BILATERAL MASTECTOMY WITH LEFT SENTINEL LYMPH NODE BIOPSY;  Surgeon: Autumn Messing III, MD;  Location: [place];  Service: General;  Laterality: Bilateral;   Removal of IUD     TUBAL LIGATION Bilateral 1995    FAMILY HISTORY: Family History  Problem Relation Age of Onset   Diabetes Mother    Hypertension Mother    Hypertension Father    Diabetes Maternal Grandmother    Hypertension Sister    Cancer Paternal Grandmother  colon   Colon cancer Paternal Grandmother    Esophageal cancer Neg Hx    Stomach cancer Neg Hx    Rectal cancer Neg Hx    The patient's parents are still living, in their early 23s as of June 2017. The patient has one brother, 2 sisters. On the maternal side there is a history of uterine and prostate cancer. On the paternal side there is a history of colon cancer and possibly uterine cancer. There is no history of breast or ovarian cancer in the family.    GYNECOLOGIC HISTORY:  No LMP recorded. (Menstrual status: IUD).  Menarche age 67. She still having regular periods. She is GX P1, first pregnancy age 54. She used oral contraceptives for a few years remotely, with no complications. She is currently on a Mirena IUD and also is status post bilateral tubal ligation.   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 Her  husband [name] is a Administrator.  They are both taking appropriate precautions at work.  Their son [name], age 14, is a radio DJ.  He no longer lives with the patient.  The patient has no grandchildren. She is not a Ambulance person.   ADVANCED DIRECTIVES: In the absence of any documents to the contrary her husband is her healthcare power of attorney   HEALTH MAINTENANCE: Social History   Tobacco Use   Smoking status: Never Smoker   Smokeless tobacco: Never Used  Substance Use Topics   Alcohol use: No   Drug use: No     Colonoscopy: January 2016   PAP: January 2016   Bone density: August 2017 showed a T score of +0.7 normal  Lipid panel:  Allergies  Allergen Reactions   Ace Inhibitors Cough   Atorvastatin Other (See Comments)    Joint pain   Simvastatin Other (See Comments)    Joint pain    Current Outpatient Medications  Medication Sig Dispense Refill   AFINITOR 10 MG tablet TAKE 1 TABLET BY MOUTH DAILY. 28 tablet 2   anastrozole (ARIMIDEX) 1 MG tablet Take 1 tablet (1 mg total) by mouth daily. 90 tablet 4   Calcium-Magnesium 250-125 MG TABS Take 1 tablet by mouth daily. 120 each 4   cetirizine (ZYRTEC ALLERGY) 10 MG tablet Take 10 mg by mouth daily as needed for allergies.      chlorhexidine (PERIDEX) 0.12 % solution      dexamethasone (DECADRON) 0.5 MG/5ML solution Swish 84m in mouth for 211m and spit out. Use 4 times daily for 8 weeks, start with Afinitor. Avoid eating/drinking for 1hr after rinse. 500 mL 4   docusate sodium (COLACE) 100 MG capsule Take 200-400 mg by mouth daily.     Ferrous Sulfate (IRON) 325 (65 Fe) MG TABS Take 1 tablet (325 mg total) by mouth daily. 30 each 2   fluconazole (DIFLUCAN) 100 MG tablet Take 1 tablet (100 mg total) by mouth daily. 5 tablet 0   hydrocortisone cream 1 % Apply 1 application topically daily as needed (for rash).     ibuprofen (ADVIL,MOTRIN) 600 MG tablet Take 1 tablet (600 mg total) by mouth every 6  (six) hours as needed for moderate pain. 60 tablet 0   levothyroxine (SYNTHROID, LEVOTHROID) 137 MCG tablet Take 137 mcg by mouth daily before breakfast.      LIVALO 2 MG TABS Take 2 mg by mouth daily.   6   LORazepam (ATIVAN) 0.5 MG tablet Take 1 tablet (  0.5 mg total) by mouth at bedtime as needed for anxiety. 30 tablet 0   losartan (COZAAR) 100 MG tablet Take 100 mg by mouth every morning.      Multiple Vitamin (MULTIVITAMIN) tablet Take 1 tablet by mouth daily.     omega-3 acid ethyl esters (LOVAZA) 1 g capsule Take 1 g by mouth 2 (two) times daily.      potassium chloride SA (K-DUR,KLOR-CON) 20 MEQ tablet Take 40 mEq by mouth daily.      pregabalin (LYRICA) 100 MG capsule Take 1 capsule (100 mg total) by mouth 3 (three) times daily. 90 capsule 2   prochlorperazine (COMPAZINE) 10 MG tablet Take 1 tablet (10 mg total) by mouth every 6 (six) hours as needed for nausea or vomiting. 30 tablet 0   triamterene-hydrochlorothiazide (DYAZIDE) 37.5-25 MG capsule TAKE 1 CAPSULE BY MOUTH ONCE DAILY 90 capsule 0   Vitamin D, Cholecalciferol, 1000 units CAPS Take 1 tablet by mouth daily. (Patient taking differently: Take 1,000 Units by mouth daily. ) 90 capsule 4   No current facility-administered medications for this visit.    OBJECTIVE:  African-American woman who appears stated age  89:   08/08/19 0905  BP: (!) 150/91  Pulse: 96  Resp: 18  Temp: 98.2 F (36.8 C)  SpO2: 94%     Body mass index is 41.56 kg/m.    ECOG FS:1 - Symptomatic but completely ambulatory  Filed Weights   08/08/19 0905  Weight: 261 lb 6.4 oz (118.6 kg)    Sclerae unicteric, EOMs intact Wearing a mask No cervical or supraclavicular adenopathy Lungs no rales or rhonchi Heart regular rate and rhythm Abd soft, obese, nontender, positive bowel sounds MSK no focal spinal tenderness, chronic right upper extremity lymphedema, sleeve in place Neuro: nonfocal, well oriented, appropriate affect Breasts:  Status post bilateral mastectomies.  There is no evidence of local recurrence.  Both axillae are benign.   LAB RESULTS:  CMP     Component Value Date/Time   NA 142 07/11/2019 0858   NA 140 07/08/2016 0948   K 3.6 07/11/2019 0858   K 4.1 07/08/2016 0948   CL 105 07/11/2019 0858   CO2 27 07/11/2019 0858   CO2 24 07/08/2016 0948   GLUCOSE 105 (H) 07/11/2019 0858   GLUCOSE 89 07/08/2016 0948   BUN 10 07/11/2019 0858   BUN 11.9 07/08/2016 0948   CREATININE 0.84 07/11/2019 0858   CREATININE 0.77 05/16/2019 0905   CREATININE 0.9 07/08/2016 0948   CALCIUM 9.0 07/11/2019 0858   CALCIUM 10.0 07/08/2016 0948   PROT 7.5 07/11/2019 0858   PROT 7.6 07/08/2016 0948   ALBUMIN 3.3 (L) 07/11/2019 0858   ALBUMIN 3.6 07/08/2016 0948   AST 30 07/11/2019 0858   AST 35 05/16/2019 0905   AST 22 07/08/2016 0948   ALT 34 07/11/2019 0858   ALT 40 05/16/2019 0905   ALT 27 07/08/2016 0948   ALKPHOS 173 (H) 07/11/2019 0858   ALKPHOS 115 07/08/2016 0948   BILITOT 0.3 07/11/2019 0858   BILITOT 0.7 05/16/2019 0905   BILITOT 0.35 07/08/2016 0948   GFRNONAA >60 07/11/2019 0858   GFRNONAA >60 05/16/2019 0905   GFRAA >60 07/11/2019 0858   GFRAA >60 05/16/2019 0905    INo results found for: SPEP, UPEP  Lab Results  Component Value Date   WBC 4.3 08/08/2019   NEUTROABS 3.1 08/08/2019   HGB 12.3 08/08/2019   HCT 38.2 08/08/2019   MCV 85.8 08/08/2019   PLT 289  08/08/2019      Chemistry      Component Value Date/Time   NA 142 07/11/2019 0858   NA 140 07/08/2016 0948   K 3.6 07/11/2019 0858   K 4.1 07/08/2016 0948   CL 105 07/11/2019 0858   CO2 27 07/11/2019 0858   CO2 24 07/08/2016 0948   BUN 10 07/11/2019 0858   BUN 11.9 07/08/2016 0948   CREATININE 0.84 07/11/2019 0858   CREATININE 0.77 05/16/2019 0905   CREATININE 0.9 07/08/2016 0948      Component Value Date/Time   CALCIUM 9.0 07/11/2019 0858   CALCIUM 10.0 07/08/2016 0948   ALKPHOS 173 (H) 07/11/2019 0858   ALKPHOS 115  07/08/2016 0948   AST 30 07/11/2019 0858   AST 35 05/16/2019 0905   AST 22 07/08/2016 0948   ALT 34 07/11/2019 0858   ALT 40 05/16/2019 0905   ALT 27 07/08/2016 0948   BILITOT 0.3 07/11/2019 0858   BILITOT 0.7 05/16/2019 0905   BILITOT 0.35 07/08/2016 0948       No results found for: LABCA2  No components found for: [id number]  No results for input(s): INR in the last 168 hours.  Urinalysis No results found for: COLORURINE, APPEARANCEUR, LABSPEC, PHURINE, GLUCOSEU, HGBUR, BILIRUBINUR, KETONESUR, PROTEINUR, UROBILINOGEN, NITRITE, LEUKOCYTESUR   STUDIES: CT Chest W Contrast  Result Date: 08/01/2019 CLINICAL DATA:  Breast cancer. Restaging. EXAM: CT CHEST WITH CONTRAST TECHNIQUE: Multidetector CT imaging of the chest was performed during intravenous contrast administration. CONTRAST:  64m OMNIPAQUE IOHEXOL 300 MG/ML  SOLN COMPARISON:  08/02/2018 FINDINGS: Cardiovascular: The heart size is normal. No substantial pericardial effusion. No thoracic aortic aneurysm. Mediastinum/Nodes: No mediastinal lymphadenopathy. There is no hilar lymphadenopathy. The esophagus has normal imaging features. There is no axillary lymphadenopathy. Lungs/Pleura: Stable appearance right apical scarring. Centrilobular emphsyema noted. Subpleural reticulation anterior right upper lobe compatible with radiation fibrosis, stable. 9 mm left lower lobe perifissural nodule (73/7) is stable in the interval. No new suspicious nodule or mass. No focal airspace consolidation. No pleural effusion. Upper Abdomen: The liver shows diffusely decreased attenuation suggesting fat deposition. Musculoskeletal: Lucent and mixed density bone metastases are similar to prior. 13 mm lesion in the T10 vertebral body is stable in the interval. IMPRESSION: 1. Stable exam. No new or progressive findings. 2. Stable 9 mm left lower lobe perifissural nodule. 3. Stable appearance of mixed density bone metastases. 4. Hepatic steatosis. Electronically  Signed   By: EMisty StanleyM.D.   On: 08/01/2019 09:07     ELIGIBLE FOR AVAILABLE RESEARCH PROTOCOL: P[place]AS, but inelegible because of Mirena IUD   ASSESSMENT: 54y.o. [place] woman status post right breast upper inner quadrant biopsy 10/15/2015 for a clinical T2-T3 N0, stage II invasive ductal carcinoma, estrogen and progesterone receptor positive, HER-2 nonamplified, with an Mib-1 of 70%  (1) status post right lumpectomy and sentinel lymph node sampling with oncoplastic breast reduction 11/28/2015 for a pT2 pN1, stage IIB invasive ductal carcinoma, grade 1, with negative margins (1/1 sentinel node positive)  (a) reclassified as stage IIA in the 2018 prognostic revision   (2) left reduction mammoplasty 11/28/2015 unexpectedly found a pT1c pNX invasive ductal carcinoma, grade 1, estrogen and progesterone receptor positive, HER-2 not amplified, with an MIB-1 of 3%  (3) Mammaprint from the right sided breast cancer was "low risk", predicting a 98% chance of no disease recurrence within 5 years with anti-estrogens as the only systemic therapy. It also predicts minimal to no benefit from chemotherapy  (4) status post  bilateral mastectomies with bilateral sentinel lymph node sampling 12/26/2015, showing  (a) on the right, no residual carcinoma (0/3 nodes involved)  (b) on the left, focal ductal carcinoma in situ, 0.2 cm, with negative margins; (0/4 nodes involved)  (5) adjuvant radiation 02/11/16 - 03/27/16    1) Right chest wall: 50.4 Gy in 28 fractions               2) Right chest wall boost: 10 Gy in 5 fractions   (6) started on tamoxifen neoadjuvantly 10/23/2015 to allow for expected delays in definitive local treatment  (a) Mirena IUD in place  (b) tamoxifen discontinued 08/13/2017, with progression  (7) since she had 4 lymph nodes removed from each axilla but also receive radiation on the right, lab draws should preferentially be obtained from the left arm   METASTATIC DISEASE: April  2019 (8) CT scan of the abdomen and pelvis 08/04/2017, MRI of the liver and total spine MRI 08/11/2017 and CT scan of the chest and brain on 08/13/2017 showed liver and bone metastases  (a) liver biopsy 09/07/2017 confirms estrogen receptor positive, progesterone receptor negative, HER-2 negative metastatic breast cancer  (b) chest CT scan 08/13/2017 shows multiple bilateral pulmonary nodules.  (c) CT of the brain with and without contrast 08/26/2017 shows no evidence of metastatic disease to the brain  (d) CA 27-29 is informative  (9) fulvestrant started 08/17/2017  (a) palbociclib started 08/31/2017  (b) fulvestrant and palbociclib discontinued 01/04/2018 with evidence of progression in the liver  (10) denosumab/Xgeva started 09/14/2017  (a) held after 03/01/2018 dose because concern re. osteonecrosis  (b) resumed 06/09/2018  (c) 02/21/2019 dose held due to oral surgery, resumed 03/21/2019  (11) anastrozole started 01/04/2018  (a) everolimus started 01/29/2018  (b) liver MRI and bone scan December 2019 show stable disease  (c) chest CT scan on 08/02/2018 shows lung lesions resolved, bone lesions stable  (d) bone scan 09/13/2018 essentially stable  (e) liver MRI 12/08/2018 shows improvement in hepatic metastsases  (f) liver MRI and bone scan 04/13/2019 showed continuing improvement in the liver, stable bone lesions  (g) chest CT 08/01/2019 stable, no evidence of active disease ows  (12) 01/28/18 prophylactic left femur intramedullary nail surgery for impending left femur pathologic fracture  (a) palliative radiation with Dr. [name] to the left femur and left ilium from 02/16/2018 to 03/01/2018  (13) [facility] report on liver biopsy from April 2019 shows a negative PDL 1, stable MSI and proficient mismatch repair status.  The tumor is positive for the androgen receptor, and for PTEN for PIK3 CA mutations.  HER-2/neu was read as equivocal (it was negative by FISH on the pathology  report).   PLAN: [facility] is now 2 years out from definitive diagnosis of recurrent/metastatic breast cancer.  There is no evidence of active disease.  She is tolerating her treatment remarkably well.  I am going to increase the dosing interval of the denosumab/Xgeva given her prior history of osteonecrosis.  She will receive this every 3 months instead of monthly.  She however is not ready to go to every 51-monthvisits.  We are going to see her every 6 weeks and follow labs closely  I reassured her that aches and pains here and there are common after the age of 89and so long as they are not persistent the do not require further follow-up.  I encouraged her to continue her stretching and walking exercises.  Assuming all continues well she will have a bone scan in  6 months and then we can consider a PET scan at the end of the year  She knows to call for any other issue that may develop before the next visit  Total encounter time 30 minutes.Sarajane Jews C. Braylynn Ghan, MD 08/08/19 9:17 AM Medical Oncology and Hematology [facility] [place], [place] [address] Tel. [phone]    Fax. [phone]   I, Wilburn Mylar, am acting as scribe for Dr. Virgie Dad. Harm Jou.  I, Lurline Del MD, have reviewed the above documentation for accuracy and completeness, and I agree with the above.   *Total Encounter Time as defined by the Centers for Medicare and Medicaid Services includes, in addition to the face-to-face time of a patient visit (documented in the note above) non-face-to-face time: obtaining and reviewing outside history, ordering and reviewing medications, tests or procedures, care coordination (communications with other health care professionals or caregivers) and documentation in the medical record.

## 2019-08-08 ENCOUNTER — Telehealth: Payer: Self-pay | Admitting: Oncology

## 2019-08-08 ENCOUNTER — Other Ambulatory Visit: Payer: Self-pay

## 2019-08-08 ENCOUNTER — Inpatient Hospital Stay (HOSPITAL_BASED_OUTPATIENT_CLINIC_OR_DEPARTMENT_OTHER): Payer: 59 | Admitting: Oncology

## 2019-08-08 ENCOUNTER — Other Ambulatory Visit: Payer: Self-pay | Admitting: Oncology

## 2019-08-08 ENCOUNTER — Inpatient Hospital Stay: Payer: 59

## 2019-08-08 VITALS — BP 150/91 | HR 96 | Temp 98.2°F | Resp 18 | Ht 66.5 in | Wt 261.4 lb

## 2019-08-08 DIAGNOSIS — C50011 Malignant neoplasm of nipple and areola, right female breast: Secondary | ICD-10-CM

## 2019-08-08 DIAGNOSIS — Z170 Estrogen receptor positive status [ER+]: Secondary | ICD-10-CM

## 2019-08-08 DIAGNOSIS — Z79899 Other long term (current) drug therapy: Secondary | ICD-10-CM | POA: Diagnosis not present

## 2019-08-08 DIAGNOSIS — M879 Osteonecrosis, unspecified: Secondary | ICD-10-CM | POA: Diagnosis not present

## 2019-08-08 DIAGNOSIS — C50911 Malignant neoplasm of unspecified site of right female breast: Secondary | ICD-10-CM | POA: Diagnosis not present

## 2019-08-08 DIAGNOSIS — K760 Fatty (change of) liver, not elsewhere classified: Secondary | ICD-10-CM | POA: Diagnosis not present

## 2019-08-08 DIAGNOSIS — C787 Secondary malignant neoplasm of liver and intrahepatic bile duct: Secondary | ICD-10-CM

## 2019-08-08 DIAGNOSIS — C50112 Malignant neoplasm of central portion of left female breast: Secondary | ICD-10-CM

## 2019-08-08 DIAGNOSIS — C7951 Secondary malignant neoplasm of bone: Secondary | ICD-10-CM

## 2019-08-08 DIAGNOSIS — C50211 Malignant neoplasm of upper-inner quadrant of right female breast: Secondary | ICD-10-CM

## 2019-08-08 DIAGNOSIS — Z7189 Other specified counseling: Secondary | ICD-10-CM

## 2019-08-08 DIAGNOSIS — Z6841 Body Mass Index (BMI) 40.0 and over, adult: Secondary | ICD-10-CM

## 2019-08-08 DIAGNOSIS — C50912 Malignant neoplasm of unspecified site of left female breast: Secondary | ICD-10-CM

## 2019-08-08 DIAGNOSIS — C50012 Malignant neoplasm of nipple and areola, left female breast: Secondary | ICD-10-CM

## 2019-08-08 DIAGNOSIS — Z79811 Long term (current) use of aromatase inhibitors: Secondary | ICD-10-CM | POA: Diagnosis not present

## 2019-08-08 LAB — COMPREHENSIVE METABOLIC PANEL
ALT: 33 U/L (ref 0–44)
AST: 27 U/L (ref 15–41)
Albumin: 3.2 g/dL — ABNORMAL LOW (ref 3.5–5.0)
Alkaline Phosphatase: 173 U/L — ABNORMAL HIGH (ref 38–126)
Anion gap: 10 (ref 5–15)
BUN: 11 mg/dL (ref 6–20)
CO2: 28 mmol/L (ref 22–32)
Calcium: 9.2 mg/dL (ref 8.9–10.3)
Chloride: 104 mmol/L (ref 98–111)
Creatinine, Ser: 0.82 mg/dL (ref 0.44–1.00)
GFR calc Af Amer: >60 mL/min (ref >60)
GFR calc non Af Amer: >60 mL/min (ref >60)
Glucose, Bld: 113 mg/dL — ABNORMAL HIGH (ref 70–99)
Potassium: 3.1 mmol/L — ABNORMAL LOW (ref 3.5–5.1)
Sodium: 142 mmol/L (ref 135–145)
Total Bilirubin: 0.3 mg/dL (ref 0.3–1.2)
Total Protein: 7.2 g/dL (ref 6.5–8.1)

## 2019-08-08 LAB — CBC WITH DIFFERENTIAL/PLATELET
Abs Immature Granulocytes: 0.03 10*3/uL (ref 0.00–0.07)
Basophils Absolute: 0 10*3/uL (ref 0.0–0.1)
Basophils Relative: 1 %
Eosinophils Absolute: 0.2 10*3/uL (ref 0.0–0.5)
Eosinophils Relative: 4 %
HCT: 38.2 % (ref 36.0–46.0)
Hemoglobin: 12.3 g/dL (ref 12.0–15.0)
Immature Granulocytes: 1 %
Lymphocytes Relative: 11 %
Lymphs Abs: 0.5 10*3/uL — ABNORMAL LOW (ref 0.7–4.0)
MCH: 27.6 pg (ref 26.0–34.0)
MCHC: 32.2 g/dL (ref 30.0–36.0)
MCV: 85.8 fL (ref 80.0–100.0)
Monocytes Absolute: 0.5 10*3/uL (ref 0.1–1.0)
Monocytes Relative: 12 %
Neutro Abs: 3.1 10*3/uL (ref 1.7–7.7)
Neutrophils Relative %: 71 %
Platelets: 289 10*3/uL (ref 150–400)
RBC: 4.45 MIL/uL (ref 3.87–5.11)
RDW: 15.2 % (ref 11.5–15.5)
WBC: 4.3 10*3/uL (ref 4.0–10.5)
nRBC: 0 % (ref 0.0–0.2)

## 2019-08-08 MED ORDER — DENOSUMAB 120 MG/1.7ML ~~LOC~~ SOLN
120.0000 mg | Freq: Once | SUBCUTANEOUS | Status: AC
  Administered 2019-08-08: 120 mg via SUBCUTANEOUS

## 2019-08-08 MED ORDER — ANASTROZOLE 1 MG PO TABS: 1.0000 mg | ORAL_TABLET | Freq: Every day | ORAL | 4 refills | Status: DC

## 2019-08-08 MED ORDER — DENOSUMAB 120 MG/1.7ML ~~LOC~~ SOLN
SUBCUTANEOUS | Status: AC
  Filled 2019-08-08: qty 1.7

## 2019-08-08 NOTE — Patient Instructions (Signed)
Denosumab injection What is this medicine? DENOSUMAB (den oh sue mab) slows bone breakdown. Prolia is used to treat osteoporosis in women after menopause and in men, and in people who are taking corticosteroids for 6 months or more. Xgeva is used to treat a high calcium level due to cancer and to prevent bone fractures and other bone problems caused by multiple myeloma or cancer bone metastases. Xgeva is also used to treat giant cell tumor of the bone. This medicine may be used for other purposes; ask your health care provider or pharmacist if you have questions. COMMON BRAND NAME(S): Prolia, XGEVA What should I tell my health care provider before I take this medicine? They need to know if you have any of these conditions:  dental disease  having surgery or tooth extraction  infection  kidney disease  low levels of calcium or Vitamin D in the blood  malnutrition  on hemodialysis  skin conditions or sensitivity  thyroid or parathyroid disease  an unusual reaction to denosumab, other medicines, foods, dyes, or preservatives  pregnant or trying to get pregnant  breast-feeding How should I use this medicine? This medicine is for injection under the skin. It is given by a health care professional in a hospital or clinic setting. A special MedGuide will be given to you before each treatment. Be sure to read this information carefully each time. For Prolia, talk to your pediatrician regarding the use of this medicine in children. Special care may be needed. For Xgeva, talk to your pediatrician regarding the use of this medicine in children. While this drug may be prescribed for children as young as 13 years for selected conditions, precautions do apply. Overdosage: If you think you have taken too much of this medicine contact a poison control center or emergency room at once. NOTE: This medicine is only for you. Do not share this medicine with others. What if I miss a dose? It is  important not to miss your dose. Call your doctor or health care professional if you are unable to keep an appointment. What may interact with this medicine? Do not take this medicine with any of the following medications:  other medicines containing denosumab This medicine may also interact with the following medications:  medicines that lower your chance of fighting infection  steroid medicines like prednisone or cortisone This list may not describe all possible interactions. Give your health care provider a list of all the medicines, herbs, non-prescription drugs, or dietary supplements you use. Also tell them if you smoke, drink alcohol, or use illegal drugs. Some items may interact with your medicine. What should I watch for while using this medicine? Visit your doctor or health care professional for regular checks on your progress. Your doctor or health care professional may order blood tests and other tests to see how you are doing. Call your doctor or health care professional for advice if you get a fever, chills or sore throat, or other symptoms of a cold or flu. Do not treat yourself. This drug may decrease your body's ability to fight infection. Try to avoid being around people who are sick. You should make sure you get enough calcium and vitamin D while you are taking this medicine, unless your doctor tells you not to. Discuss the foods you eat and the vitamins you take with your health care professional. See your dentist regularly. Brush and floss your teeth as directed. Before you have any dental work done, tell your dentist you are   receiving this medicine. Do not become pregnant while taking this medicine or for 5 months after stopping it. Talk with your doctor or health care professional about your birth control options while taking this medicine. Women should inform their doctor if they wish to become pregnant or think they might be pregnant. There is a potential for serious side  effects to an unborn child. Talk to your health care professional or pharmacist for more information. What side effects may I notice from receiving this medicine? Side effects that you should report to your doctor or health care professional as soon as possible:  allergic reactions like skin rash, itching or hives, swelling of the face, lips, or tongue  bone pain  breathing problems  dizziness  jaw pain, especially after dental work  redness, blistering, peeling of the skin  signs and symptoms of infection like fever or chills; cough; sore throat; pain or trouble passing urine  signs of low calcium like fast heartbeat, muscle cramps or muscle pain; pain, tingling, numbness in the hands or feet; seizures  unusual bleeding or bruising  unusually weak or tired Side effects that usually do not require medical attention (report to your doctor or health care professional if they continue or are bothersome):  constipation  diarrhea  headache  joint pain  loss of appetite  muscle pain  runny nose  tiredness  upset stomach This list may not describe all possible side effects. Call your doctor for medical advice about side effects. You may report side effects to FDA at 1-800-FDA-1088. Where should I keep my medicine? This medicine is only given in a clinic, doctor's office, or other health care setting and will not be stored at home. NOTE: This sheet is a summary. It may not cover all possible information. If you have questions about this medicine, talk to your doctor, pharmacist, or health care provider.  2020 [employer] (2017-09-03 16:10:44)

## 2019-08-08 NOTE — Telephone Encounter (Signed)
Scheduled appts per 3/30 los. Pt confirmed new appt dates and times.

## 2019-08-08 NOTE — Progress Notes (Signed)
Addendum: I left the patient a voicemail message asking her to take extra potassium today and to make sure to take her potassium daily.

## 2019-08-09 LAB — CANCER ANTIGEN 27.29: CA 27.29: 27 U/mL (ref 0.0–38.6)

## 2019-08-17 ENCOUNTER — Other Ambulatory Visit (HOSPITAL_COMMUNITY): Payer: Self-pay | Admitting: Family Medicine

## 2019-08-17 DIAGNOSIS — F39 Unspecified mood [affective] disorder: Secondary | ICD-10-CM | POA: Diagnosis not present

## 2019-08-17 DIAGNOSIS — I10 Essential (primary) hypertension: Secondary | ICD-10-CM | POA: Diagnosis not present

## 2019-08-17 MED FILL — SERTRALINE HCL 100 MG TAB: 100 | 30 days supply | Qty: 30 | Fill #1

## 2019-08-17 MED FILL — LOSARTAN-HCTZ 100-25 MG TAB: 100-25 | 90 days supply | Qty: 90 | Fill #0

## 2019-08-17 MED FILL — AFINITOR 10 MG TABLET: 10 | 28 days supply | Qty: 28 | Fill #2

## 2019-08-17 MED FILL — ROSUVASTATIN CALCIUM 5 MG T: 5 | 30 days supply | Qty: 30 | Fill #0

## 2019-09-05 ENCOUNTER — Other Ambulatory Visit: Payer: 59

## 2019-09-05 ENCOUNTER — Ambulatory Visit: Payer: 59

## 2019-09-12 ENCOUNTER — Other Ambulatory Visit: Payer: Self-pay | Admitting: Oncology

## 2019-09-12 DIAGNOSIS — C50211 Malignant neoplasm of upper-inner quadrant of right female breast: Secondary | ICD-10-CM

## 2019-09-12 DIAGNOSIS — Z170 Estrogen receptor positive status [ER+]: Secondary | ICD-10-CM

## 2019-09-14 ENCOUNTER — Other Ambulatory Visit: Payer: Self-pay | Admitting: *Deleted

## 2019-09-14 DIAGNOSIS — Z170 Estrogen receptor positive status [ER+]: Secondary | ICD-10-CM

## 2019-09-14 DIAGNOSIS — C50211 Malignant neoplasm of upper-inner quadrant of right female breast: Secondary | ICD-10-CM

## 2019-09-14 MED ORDER — EVEROLIMUS 10 MG PO TABS: 10.0000 mg | ORAL_TABLET | Freq: Every day | ORAL | 2 refills | Status: DC

## 2019-09-19 ENCOUNTER — Inpatient Hospital Stay (HOSPITAL_BASED_OUTPATIENT_CLINIC_OR_DEPARTMENT_OTHER): Payer: 59 | Admitting: Adult Health

## 2019-09-19 ENCOUNTER — Telehealth: Payer: Self-pay | Admitting: Adult Health

## 2019-09-19 ENCOUNTER — Inpatient Hospital Stay: Payer: 59 | Attending: Oncology

## 2019-09-19 ENCOUNTER — Encounter: Payer: Self-pay | Admitting: Adult Health

## 2019-09-19 ENCOUNTER — Other Ambulatory Visit: Payer: Self-pay

## 2019-09-19 VITALS — BP 165/74 | HR 100 | Temp 98.2°F | Resp 20 | Ht 66.5 in | Wt 269.0 lb

## 2019-09-19 DIAGNOSIS — F419 Anxiety disorder, unspecified: Secondary | ICD-10-CM | POA: Diagnosis not present

## 2019-09-19 DIAGNOSIS — C50211 Malignant neoplasm of upper-inner quadrant of right female breast: Secondary | ICD-10-CM | POA: Insufficient documentation

## 2019-09-19 DIAGNOSIS — I10 Essential (primary) hypertension: Secondary | ICD-10-CM | POA: Insufficient documentation

## 2019-09-19 DIAGNOSIS — Z975 Presence of (intrauterine) contraceptive device: Secondary | ICD-10-CM | POA: Diagnosis not present

## 2019-09-19 DIAGNOSIS — Z170 Estrogen receptor positive status [ER+]: Secondary | ICD-10-CM

## 2019-09-19 DIAGNOSIS — Z8249 Family history of ischemic heart disease and other diseases of the circulatory system: Secondary | ICD-10-CM | POA: Diagnosis not present

## 2019-09-19 DIAGNOSIS — C50112 Malignant neoplasm of central portion of left female breast: Secondary | ICD-10-CM

## 2019-09-19 DIAGNOSIS — Z9013 Acquired absence of bilateral breasts and nipples: Secondary | ICD-10-CM | POA: Diagnosis not present

## 2019-09-19 DIAGNOSIS — Z800 Family history of malignant neoplasm of digestive organs: Secondary | ICD-10-CM | POA: Diagnosis not present

## 2019-09-19 DIAGNOSIS — E785 Hyperlipidemia, unspecified: Secondary | ICD-10-CM | POA: Insufficient documentation

## 2019-09-19 DIAGNOSIS — C7951 Secondary malignant neoplasm of bone: Secondary | ICD-10-CM | POA: Diagnosis not present

## 2019-09-19 DIAGNOSIS — Z7952 Long term (current) use of systemic steroids: Secondary | ICD-10-CM | POA: Insufficient documentation

## 2019-09-19 DIAGNOSIS — Z79811 Long term (current) use of aromatase inhibitors: Secondary | ICD-10-CM | POA: Insufficient documentation

## 2019-09-19 DIAGNOSIS — C787 Secondary malignant neoplasm of liver and intrahepatic bile duct: Secondary | ICD-10-CM

## 2019-09-19 DIAGNOSIS — Z79899 Other long term (current) drug therapy: Secondary | ICD-10-CM | POA: Diagnosis not present

## 2019-09-19 DIAGNOSIS — Z833 Family history of diabetes mellitus: Secondary | ICD-10-CM | POA: Diagnosis not present

## 2019-09-19 DIAGNOSIS — M84552A Pathological fracture in neoplastic disease, left femur, initial encounter for fracture: Secondary | ICD-10-CM

## 2019-09-19 DIAGNOSIS — C50012 Malignant neoplasm of nipple and areola, left female breast: Secondary | ICD-10-CM

## 2019-09-19 DIAGNOSIS — C50912 Malignant neoplasm of unspecified site of left female breast: Secondary | ICD-10-CM | POA: Diagnosis not present

## 2019-09-19 DIAGNOSIS — C50911 Malignant neoplasm of unspecified site of right female breast: Secondary | ICD-10-CM

## 2019-09-19 DIAGNOSIS — F329 Major depressive disorder, single episode, unspecified: Secondary | ICD-10-CM | POA: Insufficient documentation

## 2019-09-19 DIAGNOSIS — C50011 Malignant neoplasm of nipple and areola, right female breast: Secondary | ICD-10-CM

## 2019-09-19 LAB — CBC WITH DIFFERENTIAL/PLATELET
Abs Immature Granulocytes: 0.03 10*3/uL (ref 0.00–0.07)
Basophils Absolute: 0 10*3/uL (ref 0.0–0.1)
Basophils Relative: 0 %
Eosinophils Absolute: 0.1 10*3/uL (ref 0.0–0.5)
Eosinophils Relative: 2 %
HCT: 36.9 % (ref 36.0–46.0)
Hemoglobin: 11.8 g/dL — ABNORMAL LOW (ref 12.0–15.0)
Immature Granulocytes: 1 %
Lymphocytes Relative: 6 %
Lymphs Abs: 0.3 10*3/uL — ABNORMAL LOW (ref 0.7–4.0)
MCH: 27.4 pg (ref 26.0–34.0)
MCHC: 32 g/dL (ref 30.0–36.0)
MCV: 85.6 fL (ref 80.0–100.0)
Monocytes Absolute: 0.4 10*3/uL (ref 0.1–1.0)
Monocytes Relative: 7 %
Neutro Abs: 4.4 10*3/uL (ref 1.7–7.7)
Neutrophils Relative %: 84 %
Platelets: 260 10*3/uL (ref 150–400)
RBC: 4.31 MIL/uL (ref 3.87–5.11)
RDW: 15.7 % — ABNORMAL HIGH (ref 11.5–15.5)
WBC: 5.2 10*3/uL (ref 4.0–10.5)
nRBC: 0 % (ref 0.0–0.2)

## 2019-09-19 LAB — COMPREHENSIVE METABOLIC PANEL
ALT: 26 U/L (ref 0–44)
AST: 25 U/L (ref 15–41)
Albumin: 3.1 g/dL — ABNORMAL LOW (ref 3.5–5.0)
Alkaline Phosphatase: 162 U/L — ABNORMAL HIGH (ref 38–126)
Anion gap: 12 (ref 5–15)
BUN: 9 mg/dL (ref 6–20)
CO2: 25 mmol/L (ref 22–32)
Calcium: 8.9 mg/dL (ref 8.9–10.3)
Chloride: 105 mmol/L (ref 98–111)
Creatinine, Ser: 0.79 mg/dL (ref 0.44–1.00)
GFR calc Af Amer: >60 mL/min (ref >60)
GFR calc non Af Amer: >60 mL/min (ref >60)
Glucose, Bld: 160 mg/dL — ABNORMAL HIGH (ref 70–99)
Potassium: 3.2 mmol/L — ABNORMAL LOW (ref 3.5–5.1)
Sodium: 142 mmol/L (ref 135–145)
Total Bilirubin: 0.3 mg/dL (ref 0.3–1.2)
Total Protein: 7 g/dL (ref 6.5–8.1)

## 2019-09-19 NOTE — Telephone Encounter (Signed)
Appointments were already scheduled. No changes made per 5/11 los.

## 2019-09-19 NOTE — Patient Instructions (Signed)
Potassium Content of Foods  Potassium is a mineral found in many foods and drinks. It affects how the heart works, and helps keep fluids and minerals balanced in the body. The amount of potassium you need each day depends on your age and any medical conditions you may have. Talk to your health care provider or dietitian about how much potassium you need. The following lists of foods provide the general serving size for foods and the approximate amount of potassium in each serving, listed in milligrams (mg). Actual values may vary depending on the product and how it is processed. High in potassium The following foods and beverages have 200 mg or more of potassium per serving:  Apricots (raw) - 2 have 200 mg of potassium.  Apricots (dry) - 5 have 200 mg of potassium.  Artichoke - 1 medium has 345 mg of potassium.  Avocado -  fruit has 245 mg of potassium.  Banana - 1 medium fruit has 425 mg of potassium.  Lima or baked beans (canned) -  cup has 280 mg of potassium.  White beans (canned) -  cup has 595 mg potassium.  Beef roast - 3 oz has 320 mg of potassium.  Ground beef - 3 oz has 270 mg of potassium.  Beets (raw or cooked) -  cup has 260 mg of potassium.  Bran muffin - 2 oz has 300 mg of potassium.  Broccoli (cooked) -  cup has 230 mg of potassium.  Brussels sprouts -  cup has 250 mg of potassium.  Cantaloupe -  cup has 215 mg of potassium.  Cereal, 100% bran -  cup has 200-400 mg of potassium.  Cheeseburger -1 single fast food burger has 225-400 mg of potassium.  Chicken - 3 oz has 220 mg of potassium.  Clams (canned) - 3 oz has 535 mg of potassium.  Crab - 3 oz has 225 mg of potassium.  Dates - 5 have 270 mg of potassium.  Dried beans and peas -  cup has 300-475 mg of potassium.  Figs (dried) - 2 have 260 mg of potassium.  Fish (halibut, tuna, cod, snapper) - 3 oz has 480 mg of potassium.  Fish (salmon, haddock, swordfish, perch) - 3 oz has 300 mg of  potassium.  Fish (tuna, canned) - 3 oz has 200 mg of potassium.  French fries (fast food) - 3 oz has 470 mg of potassium.  Granola with fruit and nuts -  cup has 200 mg of potassium.  Grapefruit juice -  cup has 200 mg of potassium.  Honeydew melon -  cup has 200 mg of potassium.  Kale (raw) - 1 cup has 300 mg of potassium.  Kiwi - 1 medium fruit has 240 mg of potassium.  Kohlrabi, rutabaga, parsnips -  cup has 280 mg of potassium.  Lentils -  cup has 365 mg of potassium.  Mango - 1 each has 325 mg of potassium.  Milk (nonfat, low-fat, whole, buttermilk) - 1 cup has 350-380 mg of potassium.  Milk (chocolate) - 1 cup has 420 mg of potassium  Molasses - 1 Tbsp has 295 mg of potassium.  Mushrooms -  cup has 280 mg of potassium.  Nectarine - 1 each has 275 mg of potassium.  Nuts (almonds, peanuts, hazelnuts, [place], cashew, mixed) - 1 oz has 200 mg of potassium.  Nuts (pistachios) - 1 oz has 295 mg of potassium.  Orange - 1 fruit has 240 mg of potassium.  Orange juice -    cup has 235 mg of potassium.  Papaya -  medium fruit has 390 mg of potassium.  Peanut butter (chunky) - 2 Tbsp has 240 mg of potassium.  Peanut butter (smooth) - 2 Tbsp has 210 mg of potassium.  Pear - 1 medium (200 mg) of potassium.  Pomegranate - 1 whole fruit has 400 mg of potassium.  Pomegranate juice -  cup has 215 mg of potassium.  Pork - 3 oz has 350 mg of potassium.  Potato chips (salted) - 1 oz has 465 mg of potassium.  Potato (baked with skin) - 1 medium has 925 mg of potassium.  Potato (boiled) -  cup has 255 mg of potassium.  Potato (Mashed) -  cup has 330 mg of potassium.  Prune juice -  cup has 370 mg of potassium.  Prunes - 5 have 305 mg of potassium.  Pudding (chocolate) -  cup has 230 mg of potassium.  Pumpkin (canned) -  cup has 250 mg of potassium.  Raisins (seedless) -  cup has 270 mg of potassium.  Seeds (sunflower or pumpkin) - 1 oz has 240 mg of  potassium.  Soy milk - 1 cup has 300 mg of potassium.  Spinach (cooked) - 1/2 cup has 420 mg of potassium.  Spinach (canned) -  cup has 370 mg of potassium.  Sweet potato (baked with skin) - 1 medium has 450 mg of potassium.  Swiss chard -  cup has 480 mg of potassium.  Tomato or vegetable juice -  cup has 275 mg of potassium.  Tomato (sauce or puree) -  cup has 400-550 mg of potassium.  Tomato (raw) - 1 medium has 290 mg of potassium.  Tomato (canned) -  cup has 200-300 mg of potassium.  Turkey - 3 oz has 250 mg of potassium.  Wheat germ - 1 oz has 250 mg of potassium.  Winter squash -  cup has 250 mg of potassium.  Yogurt (plain or fruited) - 6 oz has 260-435 mg of potassium.  Zucchini -  cup has 220 mg of potassium. Moderate in potassium The following foods and beverages have 50-200 mg of potassium per serving:  Apple - 1 fruit has 150 mg of potassium  Apple juice -  cup has 150 mg of potassium  Applesauce -  cup has 90 mg of potassium  Apricot nectar -  cup has 140 mg of potassium  Asparagus (small spears) -  cup has 155 mg of potassium  Asparagus (large spears) - 6 have 155 mg of potassium  Bagel (cinnamon raisin) - 1 four-inch bagel has 130 mg of potassium  Bagel (egg or plain) - 1 four- inch bagel has 70 mg of potassium  Beans (green) -  cup has 90 mg of potassium  Beans (yellow) -  cup has 190 mg of potassium  Beer, regular - 12 oz has 100 mg of potassium  Beets (canned) -  cup has 125 mg of potassium  Blackberries -  cup has 115 mg of potassium  Blueberries -  cup has 60 mg of potassium  Bread (whole wheat) - 1 slice has 70 mg of potassium  Broccoli (raw) -  cup has 145 mg of potassium  Cabbage -  cup has 150 mg of potassium  Carrots (cooked or raw) -  cup has 180 mg of potassium  Cauliflower (raw) -  cup has 150 mg of potassium  Celery (raw) -  cup has 155 mg of potassium  Cereal, bran   flakes -  cup has 120-150 mg  of potassium  Cheese (cottage) -  cup has 110 mg of potassium  Cherries - 10 have 150 mg of potassium  Chocolate - 1 oz bar has 165 mg of potassium  Coffee (brewed) - 6 oz has 90 mg of potassium  Corn -  cup or 1 ear has 195 mg of potassium  Cucumbers -  cup has 80 mg of potassium  Egg - 1 large egg has 60 mg of potassium  Eggplant -  cup has 60 mg of potassium  Endive (raw) -  cup has 80 mg of potassium  English muffin - 1 has 65 mg of potassium  Fish (ocean perch) - 3 oz has 192 mg of potassium  Frankfurter, beef or pork - 1 has 75 mg of potassium  Fruit cocktail -  cup has 115 mg of potassium  Grape juice -  cup has 170 mg of potassium  Grapefruit -  fruit has 175 mg of potassium  Grapes -  cup has 155 mg of potassium  Greens: kale, turnip, collard -  cup has 110-150 mg of potassium  Ice cream or frozen yogurt (chocolate) -  cup has 175 mg of potassium  Ice cream or frozen yogurt (vanilla) -  cup has 120-150 mg of potassium  Lemons, limes - 1 each has 80 mg of potassium  Lettuce - 1 cup has 100 mg of potassium  Mixed vegetables -  cup has 150 mg of potassium  Mushrooms, raw -  cup has 110 mg of potassium  Nuts (walnuts, pecans, or macadamia) - 1 oz has 125 mg of potassium  Oatmeal -  cup has 80 mg of potassium  Okra -  cup has 110 mg of potassium  Onions -  cup has 120 mg of potassium  Peach - 1 has 185 mg of potassium  Peaches (canned) -  cup has 120 mg of potassium  Pears (canned) -  cup has 120 mg of potassium  Peas, green (frozen) -  cup has 90 mg of potassium  Peppers (Green) -  cup has 130 mg of potassium  Peppers (Red) -  cup has 160 mg of potassium  Pineapple juice -  cup has 165 mg of potassium  Pineapple (fresh or canned) -  cup has 100 mg of potassium  Plums - 1 has 105 mg of potassium  Pudding, vanilla -  cup has 150 mg of potassium  Raspberries -  cup has 90 mg of potassium  Rhubarb -  cup has  115 mg of potassium  Rice, wild -  cup has 80 mg of potassium  Shrimp - 3 oz has 155 mg of potassium  Spinach (raw) - 1 cup has 170 mg of potassium  Strawberries -  cup has 125 mg of potassium  Summer squash -  cup has 175-200 mg of potassium  Swiss chard (raw) - 1 cup has 135 mg of potassium  Tangerines - 1 fruit has 140 mg of potassium  Tea, brewed - 6 oz has 65 mg of potassium  Turnips -  cup has 140 mg of potassium  Watermelon -  cup has 85 mg of potassium  Wine (Red, table) - 5 oz has 180 mg of potassium  Wine (White, table) - 5 oz 100 mg of potassium Low in potassium The following foods and beverages have less than 50 mg of potassium per serving.  Bread (white) - 1 slice has 30 mg of potassium    Carbonated beverages - 12 oz has less than 5 mg of potassium  Cheese - 1 oz has 20-30 mg of potassium  Cranberries -  cup has 45 mg of potassium  Cranberry juice cocktail -  cup has 20 mg of potassium  Fats and oils - 1 Tbsp has less than 5 mg of potassium  Hummus - 1 Tbsp has 32 mg of potassium  Nectar (papaya, mango, or pear) -  cup has 35 mg of potassium  Rice (white or brown) -  cup has 50 mg of potassium  Spaghetti or macaroni (cooked) -  cup has 30 mg of potassium  Tortilla, flour or corn - 1 has 50 mg of potassium  Waffle - 1 four-inch waffle has 50 mg of potassium  Water chestnuts -  cup has 40 mg of potassium Summary  Potassium is a mineral found in many foods and drinks. It affects how the heart works, and helps keep fluids and minerals balanced in the body.  The amount of potassium you need each day depends on your age and any existing medical conditions you may have. Your health care provider or dietitian may recommend an amount of potassium that you should have each day. This information is not intended to replace advice given to you by your health care provider. Make sure you discuss any questions you have with your health care  provider. Document Revised: 04/09/2017 Document Reviewed: 07/22/2016 [employer] Patient Education  2020 [employer]   

## 2019-09-19 NOTE — Progress Notes (Signed)
Debbie [name]  [phone] [phone]) [phone]    ID: Debbie [name] DOB: 08/24/1965  MR#: [id number]  ASN#:[id number]  Patient Care Team: Debbie Pillar, MD as PCP - General (Family Medicine) Debbie Pi, MD as Consulting Physician (Endocrinology) Debbie Kussmaul, MD as Consulting Physician (General Surgery) [name], Virgie Dad, MD as Consulting Physician (Oncology) Debbie Gibson, MD as Attending Physician (Radiation Oncology) Debbie Crome, MD as Consulting Physician (Cardiology) Debbie Reese, MD as Consulting Physician (Plastic Surgery) Debbie [name], Debbie Massed, NP as Nurse Practitioner (Hematology and Oncology) Debbie [name] (Inactive) Debbie [name], DMD (Dentistry) OTHER MD:   CHIEF COMPLAINT: Estrogen receptor positive stage IV  breast cancer (s/p bilateral mastectomies)  CURRENT TREATMENT:  Anastrozole, affinitor; denosumab/Xgeva   INTERVAL HISTORY: Debbie [name] returns today for follow-up and treatment of her estrogen receptor positive stage IV breast cancer.  She continues on anastrozole.  She tolerates this well.    She also continues on affinitor with good tolerance.  Her mouth is healed.    She also continues on denosumab/Xgeva and this interval was spaced out to every 3 months due to history of osteonecrosis.    Her most restaging chest CT was completed on 3/23 and showed no evidence of disease.    REVIEW OF SYSTEMS: Debbie [name] is doing well.  She notes she has recently went to her PCP and had some medication changes to her anti hypertensives and cholesterol medications.  She has f/u with her on Thursday.  She is also taking Zoloft for anxiety which is helping.  She notes she is f/u with her dentist about her mouth.  [name] has some stable lower back aching that varies and is intermittent.  She notes it is worse with gaining weight.  She is exercising some.  She denies any fever, chills, chest pain, palpitations, cough, bowel/bladder  changes, headaches, nausea, vomiting, shortness of breath, or any other concerns.  A detailed ROS was otherwise non contributory.     BREAST CANCER HISTORY: From the original intake note:  Debbie [name] had screening mammography with tomosynthesis at the [facility] 10/02/2015. This showed 2 possible masses in the right breast as well as some suspicious calcifications. The left breast was unremarkable. On 10/15/2015 she underwent right diagnostic mammography with tomography and right breast ultrasonography. The breast density was category B. Spot magnification views confirmed an area of pleomorphic calcifications measuring up to 5 cm in the upper half of the breast. Also in the upper outer right breast there were adjacent low-density masses without distortion or calcifications. On physical exam there was no palpable abnormality.  Targeted ultrasound of the right breast found the 2 "masses" in the right upper quadrant were benign cysts measuring 0.8 and 0.5 cm respectively. Biopsy of the area of calcification on 10/15/2015 showed ([id number]) invasive ductal carcinoma, E-cadherin positive, grade 2, estrogen receptor 90% positive, progesterone receptor 50% positive, both with strong staining intensity, with an MIB-1 of 70%, and no HER-2 amplification, the signals ratio being 1.18 and the number per cell 2.60.  Her subsequent history is as detailed below    PAST MEDICAL HISTORY: Past Medical History:  Diagnosis Date  . Anxiety   . Arthritis   . Bone metastasis ([place])   . Breast cancer of upper-inner quadrant of right female breast [facility]) oncologist-  dr [name]--- 04/ 2019 ER/PR+ Stage IV w/ metastatic disease to liver, total spine, lung, bone   dx 06/ 2017  right breast invasive ductal carcinoma, Stage IIB, ER/PR + (pT2 pN1),  grade 1-- s/p  right breast lumpecotmy w/ sln bx's and bilateral breast reduction 11-28-2015/  left breast with reduction , dx invasive ductal ca, Grade 1 (pT1cpNX) ER/PR  positive/  adjuvant radiation completed 03-27-2016 to right chest wall, 2018  reclassifiec Stage IIA  . Cancer, metastatic to liver ([place])   . Chronic back pain   . Constipation   . Depression   . Family history of adverse reaction to anesthesia     mother has Copd was kept on vent   . Headache   . Heart murmur   . History of hyperthyroidism    s/p  RAI  . History of radiation therapy 02/11/16- 03/27/16   Right Chest Wall 50.4 Gy in 28 fractions, Right Chest Wall Boost 10 Gy in 5 fractions.   . Hyperlipidemia   . Hypertension   . Hypothyroidism, postradioiodine therapy    endocrinoloigst-  dr balan--  2004  s/p  RAI  . Menorrhagia 2011  . Multiple pulmonary nodules    secondary  metastatic  . PONV (postoperative nausea and vomiting)    patch helps   . SUI (stress urinary incontinence, female)   . Wears contact lenses   . Wears dentures    full upper and lower partial    PAST SURGICAL HISTORY: Past Surgical History:  Procedure Laterality Date  . BREAST LUMPECTOMY WITH NEEDLE LOCALIZATION AND AXILLARY SENTINEL LYMPH NODE BX Right 11/28/2015   Procedure: RIGHT BREAST LUMPECTOMY WITH NEEDLE LOCALIZATION AND AXILLARY SENTINEL LYMPH NODE BX;  Surgeon: Autumn Messing III, MD;  Location: [place];  Service: General;  Laterality: Right;  . BREAST REDUCTION SURGERY Bilateral 11/28/2015   Procedure: MAMMARY REDUCTION  (BREAST) BILATERAL;  Surgeon: Debbie Reese, MD;  Location: [place];  Service: Plastics;  Laterality: Bilateral;  . CESAREAN SECTION  01/12/1993  . COLONOSCOPY    . Dental surgeries    . FEMUR IM NAIL Left 01/28/2018   Procedure: INTRAMEDULLARY (IM) NAIL FEMORAL;  Surgeon: Nicholes Stairs, MD;  Location: [place];  Service: Orthopedics;  Laterality: Left;  . HYSTEROSCOPY N/A 09/02/2017   Procedure: HYSTEROSCOPY  to remove IUD;  Surgeon: Eldred Manges, MD;  Location: [facility];  Service: Gynecology;  Laterality: N/A;  . Liver biospy    . MASTECTOMY    .  MASTECTOMY W/ SENTINEL NODE BIOPSY Bilateral 12/26/2015   Procedure: BILATERAL MASTECTOMY WITH LEFT SENTINEL LYMPH NODE BIOPSY;  Surgeon: Autumn Messing III, MD;  Location: [place];  Service: General;  Laterality: Bilateral;  . Removal of IUD    . TUBAL LIGATION Bilateral 1995    FAMILY HISTORY: Family History  Problem Relation Age of Onset  . Diabetes Mother   . Hypertension Mother   . Hypertension Father   . Diabetes Maternal Grandmother   . Hypertension Sister   . Cancer Paternal Grandmother        colon  . Colon cancer Paternal Grandmother   . Esophageal cancer Neg Hx   . Stomach cancer Neg Hx   . Rectal cancer Neg Hx    The patient's parents are still living, in their early 36s as of June 2017. The patient has one brother, 2 sisters. On the maternal side there is a history of uterine and prostate cancer. On the paternal side there is a history of colon cancer and possibly uterine cancer. There is no history of breast or ovarian cancer in the family.    GYNECOLOGIC HISTORY:  No LMP recorded. (Menstrual status: IUD).  Menarche age 72. She still having regular periods. She is GX P1, first pregnancy age 26. She used oral contraceptives for a few years remotely, with no complications. She is currently on a Mirena IUD and also is status post bilateral tubal ligation.   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 Her husband [name] is a Administrator.  They are both taking appropriate precautions at work.  Their son [name], age 9, is a radio DJ.  He no longer lives with the patient.  The patient has no grandchildren. She is not a Ambulance person.   ADVANCED DIRECTIVES: In the absence of any documents to the contrary her husband is her healthcare power of attorney   HEALTH MAINTENANCE: Social History   Tobacco Use  . Smoking status: Never Smoker  . Smokeless tobacco: Never Used  Substance Use Topics  . Alcohol use: No  . Drug use: No      Colonoscopy: January 2016   PAP: January 2016   Bone density: August 2017 showed a T score of +0.7 normal  Lipid panel:  Allergies  Allergen Reactions  . Ace Inhibitors Cough  . Atorvastatin Other (See Comments)    Joint pain  . Simvastatin Other (See Comments)    Joint pain    Current Outpatient Medications  Medication Sig Dispense Refill  . anastrozole (ARIMIDEX) 1 MG tablet Take 1 tablet (1 mg total) by mouth daily. 90 tablet 4  . Calcium-Magnesium 250-125 MG TABS Take 1 tablet by mouth daily. 120 each 4  . cetirizine (ZYRTEC ALLERGY) 10 MG tablet Take 10 mg by mouth daily as needed for allergies.     . chlorhexidine (PERIDEX) 0.12 % solution     . dexamethasone (DECADRON) 0.5 MG/5ML solution Swish 78m in mouth for 250m and spit out. Use 4 times daily for 8 weeks, start with Afinitor. Avoid eating/drinking for 1hr after rinse. 500 mL 4  . docusate sodium (COLACE) 100 MG capsule Take 200-400 mg by mouth daily.    . Marland Kitchenverolimus (AFINITOR) 10 MG tablet Take 1 tablet (10 mg total) by mouth daily. 28 tablet 2  . Ferrous Sulfate (IRON) 325 (65 Fe) MG TABS Take 1 tablet (325 mg total) by mouth daily. 30 each 2  . hydrocortisone cream 1 % Apply 1 application topically daily as needed (for rash).    . Marland Kitchenbuprofen (ADVIL,MOTRIN) 600 MG tablet Take 1 tablet (600 mg total) by mouth every 6 (six) hours as needed for moderate pain. 60 tablet 0  . levothyroxine (SYNTHROID, LEVOTHROID) 137 MCG tablet Take 137 mcg by mouth daily before breakfast.     . LORazepam (ATIVAN) 0.5 MG tablet Take 1 tablet (0.5 mg total) by mouth at bedtime as needed for anxiety. 30 tablet 0  . losartan (COZAAR) 100 MG tablet Take 100 mg by mouth every morning.     . Multiple Vitamin (MULTIVITAMIN) tablet Take 1 tablet by mouth daily.    . Marland Kitchenmega-3 acid ethyl esters (LOVAZA) 1 g capsule Take 1 g by mouth 2 (two) times daily.     . potassium chloride SA (K-DUR,KLOR-CON) 20 MEQ tablet Take 40 mEq by mouth daily.     .  pregabalin (LYRICA) 100 MG capsule Take 1 capsule (100 mg total) by mouth 3 (three) times daily. 90 capsule 2  . prochlorperazine (COMPAZINE) 10 MG tablet Take 1 tablet (10 mg total) by mouth every 6 (six) hours as needed for nausea or vomiting. 30 tablet 0  .  rosuvastatin (CRESTOR) 5 MG tablet Take 1 tablet (5 mg total) by mouth daily.    . sertraline (ZOLOFT) 100 MG tablet Take 1 tablet (100 mg total) by mouth daily.    Marland Kitchen triamterene-hydrochlorothiazide (DYAZIDE) 37.5-25 MG capsule TAKE 1 CAPSULE BY MOUTH ONCE DAILY 90 capsule 0  . Vitamin D, Cholecalciferol, 1000 units CAPS Take 1 tablet by mouth daily. (Patient taking differently: Take 1,000 Units by mouth daily. ) 90 capsule 4   No current facility-administered medications for this visit.    OBJECTIVE:    Vitals:   09/19/19 0950  BP: (!) 165/74  Pulse: 100  Resp: 20  Temp: 98.2 F (36.8 C)  SpO2: 97%     Body mass index is 42.77 kg/m.    ECOG FS:1 - Symptomatic but completely ambulatory  Filed Weights   09/19/19 0950  Weight: 269 lb (122 kg)  GENERAL: Patient is a well appearing female in no acute distress HEENT:  Sclerae anicteric.  Oropharynx clear and moist. No ulcerations or evidence of oropharyngeal candidiasis. Neck is supple.  NODES:  No cervical, supraclavicular, or axillary lymphadenopathy palpated.  BREAST EXAM:  Deferred. LUNGS:  Clear to auscultation bilaterally.  No wheezes or rhonchi. HEART:  Regular rate and rhythm. No murmur appreciated. ABDOMEN:  Soft, nontender.  Positive, normoactive bowel sounds. No organomegaly palpated. MSK:  No focal spinal tenderness to palpation. Full range of motion bilaterally in the upper extremities. EXTREMITIES:  No peripheral edema.   SKIN:  Clear with no obvious rashes or skin changes. No nail dyscrasia. NEURO:  Nonfocal. Well oriented.  Appropriate affect.    LAB RESULTS:  CMP     Component Value Date/Time   NA 142 08/08/2019 0852   NA 140 07/08/2016 0948   K 3.1 (L)  08/08/2019 0852   K 4.1 07/08/2016 0948   CL 104 08/08/2019 0852   CO2 28 08/08/2019 0852   CO2 24 07/08/2016 0948   GLUCOSE 113 (H) 08/08/2019 0852   GLUCOSE 89 07/08/2016 0948   BUN 11 08/08/2019 0852   BUN 11.9 07/08/2016 0948   CREATININE 0.82 08/08/2019 0852   CREATININE 0.77 05/16/2019 0905   CREATININE 0.9 07/08/2016 0948   CALCIUM 9.2 08/08/2019 0852   CALCIUM 10.0 07/08/2016 0948   PROT 7.2 08/08/2019 0852   PROT 7.6 07/08/2016 0948   ALBUMIN 3.2 (L) 08/08/2019 0852   ALBUMIN 3.6 07/08/2016 0948   AST 27 08/08/2019 0852   AST 35 05/16/2019 0905   AST 22 07/08/2016 0948   ALT 33 08/08/2019 0852   ALT 40 05/16/2019 0905   ALT 27 07/08/2016 0948   ALKPHOS 173 (H) 08/08/2019 0852   ALKPHOS 115 07/08/2016 0948   BILITOT 0.3 08/08/2019 0852   BILITOT 0.7 05/16/2019 0905   BILITOT 0.35 07/08/2016 0948   GFRNONAA >60 08/08/2019 0852   GFRNONAA >60 05/16/2019 0905   GFRAA >60 08/08/2019 0852   GFRAA >60 05/16/2019 0905    INo results found for: SPEP, UPEP  Lab Results  Component Value Date   WBC 5.2 09/19/2019   NEUTROABS 4.4 09/19/2019   HGB 11.8 (L) 09/19/2019   HCT 36.9 09/19/2019   MCV 85.6 09/19/2019   PLT 260 09/19/2019      Chemistry      Component Value Date/Time   NA 142 08/08/2019 0852   NA 140 07/08/2016 0948   K 3.1 (L) 08/08/2019 0852   K 4.1 07/08/2016 0948   CL 104 08/08/2019 0852   CO2 28 08/08/2019  0852   CO2 24 07/08/2016 0948   BUN 11 08/08/2019 0852   BUN 11.9 07/08/2016 0948   CREATININE 0.82 08/08/2019 0852   CREATININE 0.77 05/16/2019 0905   CREATININE 0.9 07/08/2016 0948      Component Value Date/Time   CALCIUM 9.2 08/08/2019 0852   CALCIUM 10.0 07/08/2016 0948   ALKPHOS 173 (H) 08/08/2019 0852   ALKPHOS 115 07/08/2016 0948   AST 27 08/08/2019 0852   AST 35 05/16/2019 0905   AST 22 07/08/2016 0948   ALT 33 08/08/2019 0852   ALT 40 05/16/2019 0905   ALT 27 07/08/2016 0948   BILITOT 0.3 08/08/2019 0852   BILITOT 0.7  05/16/2019 0905   BILITOT 0.35 07/08/2016 0948       No results found for: LABCA2  No components found for: [id number]  No results for input(s): INR in the last 168 hours.  Urinalysis No results found for: COLORURINE, APPEARANCEUR, LABSPEC, PHURINE, GLUCOSEU, HGBUR, BILIRUBINUR, KETONESUR, PROTEINUR, UROBILINOGEN, NITRITE, LEUKOCYTESUR   STUDIES: No results found.   ELIGIBLE FOR AVAILABLE RESEARCH PROTOCOL: PALLAS, but inelegible because of Mirena IUD   ASSESSMENT: 54 y.o. [place] woman status post right breast upper inner quadrant biopsy 10/15/2015 for a clinical T2-T3 N0, stage II invasive ductal carcinoma, estrogen and progesterone receptor positive, HER-2 nonamplified, with an Mib-1 of 70%  (1) status post right lumpectomy and sentinel lymph node sampling with oncoplastic breast reduction 11/28/2015 for a pT2 pN1, stage IIB invasive ductal carcinoma, grade 1, with negative margins (1/1 sentinel node positive)  (a) reclassified as stage IIA in the 2018 prognostic revision   (2) left reduction mammoplasty 11/28/2015 unexpectedly found a pT1c pNX invasive ductal carcinoma, grade 1, estrogen and progesterone receptor positive, HER-2 not amplified, with an MIB-1 of 3%  (3) Mammaprint from the right sided breast cancer was "low risk", predicting a 98% chance of no disease recurrence within 5 years with anti-estrogens as the only systemic therapy. It also predicts minimal to no benefit from chemotherapy  (4) status post bilateral mastectomies with bilateral sentinel lymph node sampling 12/26/2015, showing  (a) on the right, no residual carcinoma (0/3 nodes involved)  (b) on the left, focal ductal carcinoma in situ, 0.2 cm, with negative margins; (0/4 nodes involved)  (5) adjuvant radiation 02/11/16 - 03/27/16    1) Right chest wall: 50.4 Gy in 28 fractions               2) Right chest wall boost: 10 Gy in 5 fractions   (6) started on tamoxifen neoadjuvantly 10/23/2015 to allow  for expected delays in definitive local treatment  (a) Mirena IUD in place  (b) tamoxifen discontinued 08/13/2017, with progression  (7) since she had 4 lymph nodes removed from each axilla but also receive radiation on the right, lab draws should preferentially be obtained from the left arm   METASTATIC DISEASE: April 2019 (8) CT scan of the abdomen and pelvis 08/04/2017, MRI of the liver and total spine MRI 08/11/2017 and CT scan of the chest and brain on 08/13/2017 showed liver and bone metastases  (a) liver biopsy 09/07/2017 confirms estrogen receptor positive, progesterone receptor negative, HER-2 negative metastatic breast cancer  (b) chest CT scan 08/13/2017 shows multiple bilateral pulmonary nodules.  (c) CT of the brain with and without contrast 08/26/2017 shows no evidence of metastatic disease to the brain  (d) CA 27-29 is informative  (9) fulvestrant started 08/17/2017  (a) palbociclib started 08/31/2017  (b) fulvestrant and palbociclib discontinued 01/04/2018 with  evidence of progression in the liver  (10) denosumab/Xgeva started 09/14/2017  (a) held after 03/01/2018 dose because concern re. osteonecrosis  (b) resumed 06/09/2018  (c) 02/21/2019 dose held due to oral surgery, resumed 03/21/2019  (11) anastrozole started 01/04/2018  (a) everolimus started 01/29/2018  (b) liver MRI and bone scan December 2019 show stable disease  (c) chest CT scan on 08/02/2018 shows lung lesions resolved, bone lesions stable  (d) bone scan 09/13/2018 essentially stable  (e) liver MRI 12/08/2018 shows improvement in hepatic metastsases  (f) liver MRI and bone scan 04/13/2019 showed continuing improvement in the liver, stable bone lesions  (g) chest CT 08/01/2019 stable, no evidence of active disease ows  (12) 01/28/18 prophylactic left femur intramedullary nail surgery for impending left femur pathologic fracture  (a) palliative radiation with Dr. [name] to the left femur and left ilium from  02/16/2018 to 03/01/2018  (13) [facility] report on liver biopsy from April 2019 shows a negative PDL 1, stable MSI and proficient mismatch repair status.  The tumor is positive for the androgen receptor, and for PTEN for PIK3 CA mutations.  HER-2/neu was read as equivocal (it was negative by FISH on the pathology report).   PLAN: Debbie [name] is doing well.  Her labs are stable and she has no sign of cancer progression.  She will continue on treatment with anastrozole and everolimus.  She is not due for xgeva today and will receive it in 6 weeks.    Her potassium is slightly low, and I gave her a handout on potassium rich foods.    We faxed her labs to Dr. Laurann Montana at [facility] request.  I recommended healthy diet and exercise daily.    Debbie [name] will return in 6 weeks for labs, f/u with Dr. [name] and xgeva.  She was recommended to continue with the appropriate pandemic precautions. She knows to call for any questions that may arise between now and her next appointment.  We are happy to see her sooner if needed.  Total encounter time 20 minutes.Wilber Bihari, NP 09/19/19 10:02 AM Medical Oncology and Hematology [facility] [place], [place] [address] Tel. [phone]    Fax. [phone]   *Total Encounter Time as defined by the Centers for Medicare and Medicaid Services includes, in addition to the face-to-face time of a patient visit (documented in the note above) non-face-to-face time: obtaining and reviewing outside history, ordering and reviewing medications, tests or procedures, care coordination (communications with other health care professionals or caregivers) and documentation in the medical record.

## 2019-09-20 LAB — CANCER ANTIGEN 27.29: CA 27.29: 35.6 U/mL (ref 0.0–38.6)

## 2019-09-21 DIAGNOSIS — R7303 Prediabetes: Secondary | ICD-10-CM | POA: Diagnosis not present

## 2019-09-21 DIAGNOSIS — R609 Edema, unspecified: Secondary | ICD-10-CM | POA: Diagnosis not present

## 2019-09-21 DIAGNOSIS — I10 Essential (primary) hypertension: Secondary | ICD-10-CM | POA: Diagnosis not present

## 2019-09-21 DIAGNOSIS — I890 Lymphedema, not elsewhere classified: Secondary | ICD-10-CM | POA: Diagnosis not present

## 2019-09-29 MED FILL — PREGABALIN 100 MG CAPS: 100 | 30 days supply | Qty: 90 | Fill #1

## 2019-10-14 MED FILL — SERTRALINE HCL 100 MG TAB: 100 | 30 days supply | Qty: 30 | Fill #3

## 2019-10-16 ENCOUNTER — Other Ambulatory Visit: Payer: Self-pay | Admitting: Oncology

## 2019-10-16 ENCOUNTER — Telehealth: Payer: Self-pay | Admitting: Adult Health

## 2019-10-16 NOTE — Telephone Encounter (Signed)
Scheduled appt per 6/7 sch message - pt is aware of new appts.

## 2019-10-23 MED FILL — traMADol HCL 50 MG TABS: 50 | 3 days supply | Qty: 12 | Fill #0

## 2019-10-27 ENCOUNTER — Other Ambulatory Visit (HOSPITAL_COMMUNITY): Payer: Self-pay | Admitting: Family Medicine

## 2019-10-27 MED FILL — LEVOTHYROXINE 137 MCG TAB: 137 | 90 days supply | Qty: 90 | Fill #0

## 2019-10-31 MED FILL — LOSARTAN-HCTZ 100-25 MG TAB: 100-25 | 90 days supply | Qty: 90 | Fill #1

## 2019-11-01 ENCOUNTER — Encounter: Payer: Self-pay | Admitting: Adult Health

## 2019-11-01 ENCOUNTER — Inpatient Hospital Stay (HOSPITAL_BASED_OUTPATIENT_CLINIC_OR_DEPARTMENT_OTHER): Payer: 59 | Admitting: Adult Health

## 2019-11-01 ENCOUNTER — Other Ambulatory Visit: Payer: Self-pay

## 2019-11-01 ENCOUNTER — Ambulatory Visit: Payer: 59 | Admitting: Oncology

## 2019-11-01 ENCOUNTER — Inpatient Hospital Stay: Payer: 59

## 2019-11-01 ENCOUNTER — Ambulatory Visit: Payer: 59

## 2019-11-01 ENCOUNTER — Inpatient Hospital Stay: Payer: 59 | Attending: Oncology

## 2019-11-01 ENCOUNTER — Other Ambulatory Visit: Payer: 59

## 2019-11-01 VITALS — BP 167/83 | HR 110 | Temp 98.2°F | Resp 18 | Ht 66.5 in | Wt 269.1 lb

## 2019-11-01 DIAGNOSIS — C50011 Malignant neoplasm of nipple and areola, right female breast: Secondary | ICD-10-CM

## 2019-11-01 DIAGNOSIS — C787 Secondary malignant neoplasm of liver and intrahepatic bile duct: Secondary | ICD-10-CM | POA: Diagnosis not present

## 2019-11-01 DIAGNOSIS — C50211 Malignant neoplasm of upper-inner quadrant of right female breast: Secondary | ICD-10-CM | POA: Diagnosis not present

## 2019-11-01 DIAGNOSIS — Z170 Estrogen receptor positive status [ER+]: Secondary | ICD-10-CM

## 2019-11-01 DIAGNOSIS — R918 Other nonspecific abnormal finding of lung field: Secondary | ICD-10-CM | POA: Insufficient documentation

## 2019-11-01 DIAGNOSIS — Z79811 Long term (current) use of aromatase inhibitors: Secondary | ICD-10-CM | POA: Diagnosis not present

## 2019-11-01 DIAGNOSIS — Z9013 Acquired absence of bilateral breasts and nipples: Secondary | ICD-10-CM | POA: Insufficient documentation

## 2019-11-01 DIAGNOSIS — C7951 Secondary malignant neoplasm of bone: Secondary | ICD-10-CM | POA: Diagnosis not present

## 2019-11-01 DIAGNOSIS — C50112 Malignant neoplasm of central portion of left female breast: Secondary | ICD-10-CM

## 2019-11-01 DIAGNOSIS — C50012 Malignant neoplasm of nipple and areola, left female breast: Secondary | ICD-10-CM

## 2019-11-01 LAB — CBC WITH DIFFERENTIAL/PLATELET
Abs Immature Granulocytes: 0.03 10*3/uL (ref 0.00–0.07)
Basophils Absolute: 0 10*3/uL (ref 0.0–0.1)
Basophils Relative: 1 %
Eosinophils Absolute: 0.2 10*3/uL (ref 0.0–0.5)
Eosinophils Relative: 3 %
HCT: 37 % (ref 36.0–46.0)
Hemoglobin: 12.1 g/dL (ref 12.0–15.0)
Immature Granulocytes: 1 %
Lymphocytes Relative: 11 %
Lymphs Abs: 0.6 10*3/uL — ABNORMAL LOW (ref 0.7–4.0)
MCH: 27.6 pg (ref 26.0–34.0)
MCHC: 32.7 g/dL (ref 30.0–36.0)
MCV: 84.5 fL (ref 80.0–100.0)
Monocytes Absolute: 0.5 10*3/uL (ref 0.1–1.0)
Monocytes Relative: 8 %
Neutro Abs: 4.6 10*3/uL (ref 1.7–7.7)
Neutrophils Relative %: 76 %
Platelets: 308 10*3/uL (ref 150–400)
RBC: 4.38 MIL/uL (ref 3.87–5.11)
RDW: 15.7 % — ABNORMAL HIGH (ref 11.5–15.5)
WBC: 6 10*3/uL (ref 4.0–10.5)
nRBC: 0 % (ref 0.0–0.2)

## 2019-11-01 LAB — COMPREHENSIVE METABOLIC PANEL
ALT: 28 U/L (ref 0–44)
AST: 21 U/L (ref 15–41)
Albumin: 3.4 g/dL — ABNORMAL LOW (ref 3.5–5.0)
Alkaline Phosphatase: 159 U/L — ABNORMAL HIGH (ref 38–126)
Anion gap: 11 (ref 5–15)
BUN: 11 mg/dL (ref 6–20)
CO2: 27 mmol/L (ref 22–32)
Calcium: 9.5 mg/dL (ref 8.9–10.3)
Chloride: 105 mmol/L (ref 98–111)
Creatinine, Ser: 0.86 mg/dL (ref 0.44–1.00)
GFR calc Af Amer: >60 mL/min (ref >60)
GFR calc non Af Amer: >60 mL/min (ref >60)
Glucose, Bld: 104 mg/dL — ABNORMAL HIGH (ref 70–99)
Potassium: 3.6 mmol/L (ref 3.5–5.1)
Sodium: 143 mmol/L (ref 135–145)
Total Bilirubin: 0.3 mg/dL (ref 0.3–1.2)
Total Protein: 7.2 g/dL (ref 6.5–8.1)

## 2019-11-01 MED FILL — PREGABALIN 100 MG CAPS: 100 | 30 days supply | Qty: 90 | Fill #0

## 2019-11-01 NOTE — Progress Notes (Signed)
[place]  Telephone:(336) [phone] Fax:(336) [phone]    ID: Debbie [name] DOB: 1966-01-01  MR#: [id number]  IFO#:[id number]  Patient Care Team: Kelton Pillar, MD as PCP - General (Family Medicine) Jacelyn Pi, MD as Consulting Physician (Endocrinology) Jovita Kussmaul, MD as Consulting Physician (General Surgery) [name], Virgie Dad, MD as Consulting Physician (Oncology) [name], MD as Attending Physician (Radiation Oncology) Belva Crome, MD as Consulting Physician (Cardiology) Crissie Reese, MD as Consulting Physician (Plastic Surgery) [name], [name], NP as Nurse Practitioner (Hematology and Oncology) Margarette Canada (Inactive) Lajoyce Lauber, DMD (Dentistry) OTHER MD:   CHIEF COMPLAINT: Estrogen receptor positive stage IV  breast cancer (s/p bilateral mastectomies)  CURRENT TREATMENT:  Anastrozole, affinitor; denosumab/Xgeva   INTERVAL HISTORY: Debbie [name] returns today for follow-up and treatment of her estrogen receptor positive stage IV breast cancer.  She continues on anastrozole.  She tolerates this well.    She also continues on affinitor with good tolerance.  Her mouth is healed.    She also continues on denosumab/Xgeva and this interval was spaced out to every 3 months due to history of osteonecrosis.  She did recently have a dental extraction last week, and is healing from this.  She is taking Amoxicillin.    Her most restaging chest CT was completed on 3/23 and showed no evidence of disease.    We are following her tumor markers which have been stable:  Results for TECKLA, CHRISTIANSEN (MRN [id number]) as of 11/03/2019 20:15  Ref. Range 06/13/2019 09:00 07/11/2019 08:58 08/08/2019 08:52 09/19/2019 09:27 11/01/2019 13:29  CA 27.29 Latest Ref Range: 0.0 - 38.6 U/mL 27.2 31.9 27.0 35.6 32.9    REVIEW OF SYSTEMS: Geraldy is feeling well today.  She tells me that she continues on her treatments with no significant issues.  She is  without any fever, chills, chest pain, or palpitations.  Her appetite is good, and her activity level remains stable.  She has no oral ulcerations, bowel/bladder changes, headaches, vision issues, nausea or vomiting.  A detailed ROS was otherwise non contributory.  BREAST CANCER HISTORY: From the original intake note:  Chanin had screening mammography with tomosynthesis [facility] 10/02/2015. This showed 2 possible masses in the right breast as well as some suspicious calcifications. The left breast was unremarkable. On 10/15/2015 she underwent right diagnostic mammography with tomography and right breast ultrasonography. The breast density was category B. Spot magnification views confirmed an area of pleomorphic calcifications measuring up to 5 cm in the upper half of the breast. Also in the upper outer right breast there were adjacent low-density masses without distortion or calcifications. On physical exam there was no palpable abnormality.  Targeted ultrasound of the right breast found the 2 "masses" in the right upper quadrant were benign cysts measuring 0.8 and 0.5 cm respectively. Biopsy of the area of calcification on 10/15/2015 showed ([id number]) invasive ductal carcinoma, E-cadherin positive, grade 2, estrogen receptor 90% positive, progesterone receptor 50% positive, both with strong staining intensity, with an MIB-1 of 70%, and no HER-2 amplification, the signals ratio being 1.18 and the number per cell 2.60.  Her subsequent history is as detailed below    PAST MEDICAL HISTORY: Past Medical History:  Diagnosis Date  . Anxiety   . Arthritis   . Bone metastasis ([place])   . Breast cancer of upper-inner quadrant of right female breast [facility]) oncologist-  dr [name]--- 04/ 2019 ER/PR+ Stage IV w/ metastatic disease to liver, total spine,  lung, bone   dx 06/ 2017  right breast invasive ductal carcinoma, Stage IIB, ER/PR + (pT2 pN1), grade 1-- s/p  right breast lumpecotmy w/ sln  bx's and bilateral breast reduction 11-28-2015/  left breast with reduction , dx invasive ductal ca, Grade 1 (pT1cpNX) ER/PR positive/  adjuvant radiation completed 03-27-2016 to right chest wall, 2018  reclassifiec Stage IIA  . Cancer, metastatic to liver ([place])   . Chronic back pain   . Constipation   . Depression   . Family history of adverse reaction to anesthesia     mother has Copd was kept on vent   . Headache   . Heart murmur   . History of hyperthyroidism    s/p  RAI  . History of radiation therapy 02/11/16- 03/27/16   Right Chest Wall 50.4 Gy in 28 fractions, Right Chest Wall Boost 10 Gy in 5 fractions.   . Hyperlipidemia   . Hypertension   . Hypothyroidism, postradioiodine therapy    endocrinoloigst-  dr balan--  2004  s/p  RAI  . Menorrhagia 2011  . Multiple pulmonary nodules    secondary  metastatic  . PONV (postoperative nausea and vomiting)    patch helps   . SUI (stress urinary incontinence, female)   . Wears contact lenses   . Wears dentures    full upper and lower partial    PAST SURGICAL HISTORY: Past Surgical History:  Procedure Laterality Date  . BREAST LUMPECTOMY WITH NEEDLE LOCALIZATION AND AXILLARY SENTINEL LYMPH NODE BX Right 11/28/2015   Procedure: RIGHT BREAST LUMPECTOMY WITH NEEDLE LOCALIZATION AND AXILLARY SENTINEL LYMPH NODE BX;  Surgeon: Autumn Messing III, MD;  Location: [place];  Service: General;  Laterality: Right;  . BREAST REDUCTION SURGERY Bilateral 11/28/2015   Procedure: MAMMARY REDUCTION  (BREAST) BILATERAL;  Surgeon: Crissie Reese, MD;  Location: [place];  Service: Plastics;  Laterality: Bilateral;  . CESAREAN SECTION  01/12/1993  . COLONOSCOPY    . Dental surgeries    . FEMUR IM NAIL Left 01/28/2018   Procedure: INTRAMEDULLARY (IM) NAIL FEMORAL;  Surgeon: Nicholes Stairs, MD;  Location: [place];  Service: Orthopedics;  Laterality: Left;  . HYSTEROSCOPY N/A 09/02/2017   Procedure: HYSTEROSCOPY  to remove IUD;  Surgeon: Eldred Manges, MD;   Location: [facility];  Service: Gynecology;  Laterality: N/A;  . Liver biospy    . MASTECTOMY    . MASTECTOMY W/ SENTINEL NODE BIOPSY Bilateral 12/26/2015   Procedure: BILATERAL MASTECTOMY WITH LEFT SENTINEL LYMPH NODE BIOPSY;  Surgeon: Autumn Messing III, MD;  Location: [place];  Service: General;  Laterality: Bilateral;  . Removal of IUD    . TUBAL LIGATION Bilateral 1995    FAMILY HISTORY: Family History  Problem Relation Age of Onset  . Diabetes Mother   . Hypertension Mother   . Hypertension Father   . Diabetes Maternal Grandmother   . Hypertension Sister   . Cancer Paternal Grandmother        colon  . Colon cancer Paternal Grandmother   . Esophageal cancer Neg Hx   . Stomach cancer Neg Hx   . Rectal cancer Neg Hx    The patient's parents are still living, in their early 81s as of June 2017. The patient has one brother, 2 sisters. On the maternal side there is a history of uterine and prostate cancer. On the paternal side there is a history of colon cancer and possibly uterine cancer. There is no history of breast  or ovarian cancer in the family.    GYNECOLOGIC HISTORY:  No LMP recorded. (Menstrual status: IUD).  Menarche age 58. She still having regular periods. She is GX P1, first pregnancy age 2. She used oral contraceptives for a few years remotely, with no complications. She is currently on a Mirena IUD and also is status post bilateral tubal ligation.   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 Her husband [name] is a Administrator.  They are both taking appropriate precautions at work.  Their son [name], age 79, is a radio DJ.  He no longer lives with the patient.  The patient has no grandchildren. She is not a Ambulance person.   ADVANCED DIRECTIVES: In the absence of any documents to the contrary her husband is her healthcare power of attorney   HEALTH MAINTENANCE: Social History   Tobacco Use  . Smoking  status: Never Smoker  . Smokeless tobacco: Never Used  Vaping Use  . Vaping Use: Never used  Substance Use Topics  . Alcohol use: No  . Drug use: No     Colonoscopy: January 2016   PAP: January 2016   Bone density: August 2017 showed a T score of +0.7 normal  Lipid panel:  Allergies  Allergen Reactions  . Ace Inhibitors Cough  . Atorvastatin Other (See Comments)    Joint pain  . Simvastatin Other (See Comments)    Joint pain    Current Outpatient Medications  Medication Sig Dispense Refill  . amoxicillin (AMOXIL) 500 MG capsule Take 500 mg by mouth 3 (three) times daily. Dental procedure    . anastrozole (ARIMIDEX) 1 MG tablet Take 1 tablet (1 mg total) by mouth daily. 90 tablet 4  . Calcium-Magnesium 250-125 MG TABS Take 1 tablet by mouth daily. 120 each 4  . cetirizine (ZYRTEC ALLERGY) 10 MG tablet Take 10 mg by mouth daily as needed for allergies.     . chlorhexidine (PERIDEX) 0.12 % solution     . dexamethasone (DECADRON) 0.5 MG/5ML solution Swish 55m in mouth for 268m and spit out. Use 4 times daily for 8 weeks, start with Afinitor. Avoid eating/drinking for 1hr after rinse. 500 mL 4  . docusate sodium (COLACE) 100 MG capsule Take 200-400 mg by mouth daily.    . Marland Kitchenverolimus (AFINITOR) 10 MG tablet Take 1 tablet (10 mg total) by mouth daily. 28 tablet 2  . Ferrous Sulfate (IRON) 325 (65 Fe) MG TABS Take 1 tablet (325 mg total) by mouth daily. 30 each 2  . hydrocortisone cream 1 % Apply 1 application topically daily as needed (for rash).    . Marland Kitchenbuprofen (ADVIL,MOTRIN) 600 MG tablet Take 1 tablet (600 mg total) by mouth every 6 (six) hours as needed for moderate pain. 60 tablet 0  . levothyroxine (SYNTHROID, LEVOTHROID) 137 MCG tablet Take 137 mcg by mouth daily before breakfast.     . LORazepam (ATIVAN) 0.5 MG tablet Take 1 tablet (0.5 mg total) by mouth at bedtime as needed for anxiety. 30 tablet 0  . losartan (COZAAR) 100 MG tablet Take 100 mg by mouth every morning.     .  Multiple Vitamin (MULTIVITAMIN) tablet Take 1 tablet by mouth daily.    . Marland Kitchenmega-3 acid ethyl esters (LOVAZA) 1 g capsule Take 1 g by mouth 2 (two) times daily.     . potassium chloride SA (K-DUR,KLOR-CON) 20 MEQ tablet Take 40 mEq by mouth daily.     .Marland Kitchen  pregabalin (LYRICA) 100 MG capsule Take 1 capsule (100 mg total) by mouth 3 (three) times daily. 90 capsule 2  . prochlorperazine (COMPAZINE) 10 MG tablet Take 1 tablet (10 mg total) by mouth every 6 (six) hours as needed for nausea or vomiting. 30 tablet 0  . rosuvastatin (CRESTOR) 5 MG tablet Take 1 tablet (5 mg total) by mouth daily.    . sertraline (ZOLOFT) 100 MG tablet Take 1 tablet (100 mg total) by mouth daily.    Marland Kitchen triamterene-hydrochlorothiazide (DYAZIDE) 37.5-25 MG capsule TAKE 1 CAPSULE BY MOUTH ONCE DAILY 90 capsule 0  . Vitamin D, Cholecalciferol, 1000 units CAPS Take 1 tablet by mouth daily. (Patient taking differently: Take 1,000 Units by mouth daily. ) 90 capsule 4   No current facility-administered medications for this visit.    OBJECTIVE:    Vitals:   11/01/19 1348  BP: (!) 167/83  Pulse: (!) 110  Resp: 18  Temp: 98.2 F (36.8 C)  SpO2: 95%     Body mass index is 42.78 kg/m.    ECOG FS:1 - Symptomatic but completely ambulatory  Filed Weights   11/01/19 1348  Weight: 269 lb 1.6 oz (122.1 kg)  GENERAL: Patient is a well appearing female in no acute distress HEENT:  Sclerae anicteric.  Oropharynx clear and moist. No ulcerations or evidence of oropharyngeal candidiasis. Neck is supple.  NODES:  No cervical, supraclavicular, or axillary lymphadenopathy palpated.  BREAST EXAM:  Deferred. LUNGS:  Clear to auscultation bilaterally.  No wheezes or rhonchi. HEART:  Regular rate and rhythm. No murmur appreciated. ABDOMEN:  Soft, nontender.  Positive, normoactive bowel sounds. No organomegaly palpated. MSK:  No focal spinal tenderness to palpation.  EXTREMITIES:  No peripheral edema.   SKIN:  Clear with no obvious rashes or  skin changes. No nail dyscrasia. NEURO:  Nonfocal. Well oriented.  Appropriate affect.    LAB RESULTS:  CMP     Component Value Date/Time   NA 143 11/01/2019 1329   NA 140 07/08/2016 0948   K 3.6 11/01/2019 1329   K 4.1 07/08/2016 0948   CL 105 11/01/2019 1329   CO2 27 11/01/2019 1329   CO2 24 07/08/2016 0948   GLUCOSE 104 (H) 11/01/2019 1329   GLUCOSE 89 07/08/2016 0948   BUN 11 11/01/2019 1329   BUN 11.9 07/08/2016 0948   CREATININE 0.86 11/01/2019 1329   CREATININE 0.77 05/16/2019 0905   CREATININE 0.9 07/08/2016 0948   CALCIUM 9.5 11/01/2019 1329   CALCIUM 10.0 07/08/2016 0948   PROT 7.2 11/01/2019 1329   PROT 7.6 07/08/2016 0948   ALBUMIN 3.4 (L) 11/01/2019 1329   ALBUMIN 3.6 07/08/2016 0948   AST 21 11/01/2019 1329   AST 35 05/16/2019 0905   AST 22 07/08/2016 0948   ALT 28 11/01/2019 1329   ALT 40 05/16/2019 0905   ALT 27 07/08/2016 0948   ALKPHOS 159 (H) 11/01/2019 1329   ALKPHOS 115 07/08/2016 0948   BILITOT 0.3 11/01/2019 1329   BILITOT 0.7 05/16/2019 0905   BILITOT 0.35 07/08/2016 0948   GFRNONAA >60 11/01/2019 1329   GFRNONAA >60 05/16/2019 0905   GFRAA >60 11/01/2019 1329   GFRAA >60 05/16/2019 0905    INo results found for: SPEP, UPEP  Lab Results  Component Value Date   WBC 6.0 11/01/2019   NEUTROABS 4.6 11/01/2019   HGB 12.1 11/01/2019   HCT 37.0 11/01/2019   MCV 84.5 11/01/2019   PLT 308 11/01/2019      Chemistry  Component Value Date/Time   NA 143 11/01/2019 1329   NA 140 07/08/2016 0948   K 3.6 11/01/2019 1329   K 4.1 07/08/2016 0948   CL 105 11/01/2019 1329   CO2 27 11/01/2019 1329   CO2 24 07/08/2016 0948   BUN 11 11/01/2019 1329   BUN 11.9 07/08/2016 0948   CREATININE 0.86 11/01/2019 1329   CREATININE 0.77 05/16/2019 0905   CREATININE 0.9 07/08/2016 0948      Component Value Date/Time   CALCIUM 9.5 11/01/2019 1329   CALCIUM 10.0 07/08/2016 0948   ALKPHOS 159 (H) 11/01/2019 1329   ALKPHOS 115 07/08/2016 0948   AST  21 11/01/2019 1329   AST 35 05/16/2019 0905   AST 22 07/08/2016 0948   ALT 28 11/01/2019 1329   ALT 40 05/16/2019 0905   ALT 27 07/08/2016 0948   BILITOT 0.3 11/01/2019 1329   BILITOT 0.7 05/16/2019 0905   BILITOT 0.35 07/08/2016 0948       No results found for: LABCA2  No components found for: LABCA125  No results for input(s): INR in the last 168 hours.  Urinalysis No results found for: COLORURINE, APPEARANCEUR, LABSPEC, PHURINE, GLUCOSEU, HGBUR, BILIRUBINUR, KETONESUR, PROTEINUR, UROBILINOGEN, NITRITE, LEUKOCYTESUR   STUDIES: No results found.   ELIGIBLE FOR AVAILABLE RESEARCH PROTOCOL: PALLAS, but inelegible because of Mirena IUD   ASSESSMENT: 54 y.o. [place] woman status post right breast upper inner quadrant biopsy 10/15/2015 for a clinical T2-T3 N0, stage II invasive ductal carcinoma, estrogen and progesterone receptor positive, HER-2 nonamplified, with an Mib-1 of 70%  (1) status post right lumpectomy and sentinel lymph node sampling with oncoplastic breast reduction 11/28/2015 for a pT2 pN1, stage IIB invasive ductal carcinoma, grade 1, with negative margins (1/1 sentinel node positive)  (a) reclassified as stage IIA in the 2018 prognostic revision   (2) left reduction mammoplasty 11/28/2015 unexpectedly found a pT1c pNX invasive ductal carcinoma, grade 1, estrogen and progesterone receptor positive, HER-2 not amplified, with an MIB-1 of 3%  (3) Mammaprint from the right sided breast cancer was "low risk", predicting a 98% chance of no disease recurrence within 5 years with anti-estrogens as the only systemic therapy. It also predicts minimal to no benefit from chemotherapy  (4) status post bilateral mastectomies with bilateral sentinel lymph node sampling 12/26/2015, showing  (a) on the right, no residual carcinoma (0/3 nodes involved)  (b) on the left, focal ductal carcinoma in situ, 0.2 cm, with negative margins; (0/4 nodes involved)  (5) adjuvant radiation  02/11/16 - 03/27/16    1) Right chest wall: 50.4 Gy in 28 fractions               2) Right chest wall boost: 10 Gy in 5 fractions   (6) started on tamoxifen neoadjuvantly 10/23/2015 to allow for expected delays in definitive local treatment  (a) Mirena IUD in place  (b) tamoxifen discontinued 08/13/2017, with progression  (7) since she had 4 lymph nodes removed from each axilla but also receive radiation on the right, lab draws should preferentially be obtained from the left arm   METASTATIC DISEASE: April 2019 (8) CT scan of the abdomen and pelvis 08/04/2017, MRI of the liver and total spine MRI 08/11/2017 and CT scan of the chest and brain on 08/13/2017 showed liver and bone metastases  (a) liver biopsy 09/07/2017 confirms estrogen receptor positive, progesterone receptor negative, HER-2 negative metastatic breast cancer  (b) chest CT scan 08/13/2017 shows multiple bilateral pulmonary nodules.  (c) CT of the brain  with and without contrast 08/26/2017 shows no evidence of metastatic disease to the brain  (d) CA 27-29 is informative  (9) fulvestrant started 08/17/2017  (a) palbociclib started 08/31/2017  (b) fulvestrant and palbociclib discontinued 01/04/2018 with evidence of progression in the liver  (10) denosumab/Xgeva started 09/14/2017  (a) held after 03/01/2018 dose because concern re. osteonecrosis  (b) resumed 06/09/2018  (c) 02/21/2019 dose held due to oral surgery, resumed 03/21/2019  (11) anastrozole started 01/04/2018  (a) everolimus started 01/29/2018  (b) liver MRI and bone scan December 2019 show stable disease  (c) chest CT scan on 08/02/2018 shows lung lesions resolved, bone lesions stable  (d) bone scan 09/13/2018 essentially stable  (e) liver MRI 12/08/2018 shows improvement in hepatic metastsases  (f) liver MRI and bone scan 04/13/2019 showed continuing improvement in the liver, stable bone lesions  (g) chest CT 08/01/2019 stable, no evidence of active disease  ows  (12) 01/28/18 prophylactic left femur intramedullary nail surgery for impending left femur pathologic fracture  (a) palliative radiation with Dr. [name] to the left femur and left ilium from 02/16/2018 to 03/01/2018  (13) [facility] report on liver biopsy from April 2019 shows a negative PDL 1, stable MSI and proficient mismatch repair status.  The tumor is positive for the androgen receptor, and for PTEN for PIK3 CA mutations.  HER-2/neu was read as equivocal (it was negative by FISH on the pathology report).   PLAN: Mela is doing well today.  Her labs that have resulted thus far are stable and she has no clinical sign of breast cancer progression.  She continues on Everolimus and Anastrozole with good tolerance.    She had a dental extraction a few days ago.  Since she just had this, and also considering the fact she has a h/o osteonecrosis of the jaw with Xgeva, we will hold it for 3 months.    [place] is due for restaging in September.  We will get a bone scan and MRI liver.  She will return in 6 weeks for labs and f/u, and in 12 weeks for labs, f/u, to discuss her scan results and her Xgeva.  She knows to call for any questions that may arise between now and her next appointment.  We are happy to see her sooner if needed.   Total encounter time 20 minutes.Wilber Bihari, NP 11/03/19 8:17 PM Medical Oncology and Hematology [facility] [place], [place] [address] Tel. [phone]    Fax. [phone]   *Total Encounter Time as defined by the Centers for Medicare and Medicaid Services includes, in addition to the face-to-face time of a patient visit (documented in the note above) non-face-to-face time: obtaining and reviewing outside history, ordering and reviewing medications, tests or procedures, care coordination (communications with other health care professionals or caregivers) and documentation in the medical record.

## 2019-11-03 LAB — CANCER ANTIGEN 27.29: CA 27.29: 32.9 U/mL (ref 0.0–38.6)

## 2019-11-09 DIAGNOSIS — E890 Postprocedural hypothyroidism: Secondary | ICD-10-CM | POA: Diagnosis not present

## 2019-11-09 DIAGNOSIS — R7301 Impaired fasting glucose: Secondary | ICD-10-CM | POA: Diagnosis not present

## 2019-11-09 DIAGNOSIS — I10 Essential (primary) hypertension: Secondary | ICD-10-CM | POA: Diagnosis not present

## 2019-11-10 MED FILL — ROSUVASTATIN CALCIUM 5 MG T: 5 | 30 days supply | Qty: 30 | Fill #3

## 2019-11-10 MED FILL — AFINITOR 10 MG TABLET: 10 | 28 days supply | Qty: 28 | Fill #2

## 2019-11-10 MED FILL — SERTRALINE HCL 100 MG TABS: 100 | 30 days supply | Qty: 30 | Fill #4

## 2019-11-16 ENCOUNTER — Other Ambulatory Visit (HOSPITAL_COMMUNITY): Payer: Self-pay | Admitting: Endocrinology

## 2019-11-16 DIAGNOSIS — I10 Essential (primary) hypertension: Secondary | ICD-10-CM | POA: Diagnosis not present

## 2019-11-16 DIAGNOSIS — E890 Postprocedural hypothyroidism: Secondary | ICD-10-CM | POA: Diagnosis not present

## 2019-11-16 DIAGNOSIS — R7301 Impaired fasting glucose: Secondary | ICD-10-CM | POA: Diagnosis not present

## 2019-11-16 DIAGNOSIS — C50919 Malignant neoplasm of unspecified site of unspecified female breast: Secondary | ICD-10-CM | POA: Diagnosis not present

## 2019-11-30 ENCOUNTER — Other Ambulatory Visit: Payer: Self-pay | Admitting: Oncology

## 2019-11-30 NOTE — Progress Notes (Signed)
She has been cleared by Dr. Wonda Horner her oral surgeon for continuation of Xgeva  Last dose was March 2021  Will resume 8/3, at 8 week intervals

## 2019-12-05 ENCOUNTER — Other Ambulatory Visit: Payer: Self-pay | Admitting: Oncology

## 2019-12-05 DIAGNOSIS — C50211 Malignant neoplasm of upper-inner quadrant of right female breast: Secondary | ICD-10-CM

## 2019-12-05 DIAGNOSIS — Z170 Estrogen receptor positive status [ER+]: Secondary | ICD-10-CM

## 2019-12-06 MED FILL — PREGABALIN 100 MG CAPS: 100 | 30 days supply | Qty: 90 | Fill #0

## 2019-12-11 ENCOUNTER — Telehealth: Payer: Self-pay

## 2019-12-11 MED FILL — SERTRALINE HCL 100 MG TABS: 100 | 30 days supply | Qty: 30 | Fill #5

## 2019-12-11 MED FILL — ROSUVASTATIN CALCIUM 5 MG T: 5 | 30 days supply | Qty: 30 | Fill #4

## 2019-12-11 NOTE — Progress Notes (Signed)
Debbie [name]  [phone] [phone]    ID: Debbie [name] DOB: 1965-08-15  MR#: [id number]  LKJ#:[id number]  Patient Care Team: Kelton Pillar, MD as PCP - General (Family Medicine) Jacelyn Pi, MD as Consulting Physician (Endocrinology) Jovita Kussmaul, MD as Consulting Physician (General Surgery) [name], Virgie Dad, MD as Consulting Physician (Oncology) [name], MD as Attending Physician (Radiation Oncology) Belva Crome, MD as Consulting Physician (Cardiology) Crissie Reese, MD as Consulting Physician (Plastic Surgery) [name], [name], NP as Nurse Practitioner (Hematology and Oncology) Margarette Canada (Inactive) Lajoyce Lauber, DMD (Dentistry) OTHER MD:   CHIEF COMPLAINT: Estrogen receptor positive stage IV  breast cancer (s/p bilateral mastectomies)  CURRENT TREATMENT:  Anastrozole, affinitor; denosumab/Xgeva   INTERVAL HISTORY: Debbie [name] returns today for follow-up and treatment of her estrogen receptor positive stage IV breast cancer.  She continues on anastrozole.  She tolerates this well.    She also continues on affinitor with good tolerance.   She also continues on denosumab/Xgeva and this interval was spaced out to every 3 months due to history of osteonecrosis.  Her last dose of Xgeva was held due to a recent dental extraction.  Her most restaging chest CT was completed on 3/23 and showed no evidence of disease.  Her next restaging is due in 01/2020.  We are following her tumor markers which have been stable:  Results for Debbie, [name] (MRN [id number]) as of 12/11/2019 16:55  Ref. Range 06/13/2019 09:00 07/11/2019 08:58 08/08/2019 08:52 09/19/2019 09:27 11/01/2019 13:29  CA 27.29 Latest Ref Range: 0.0 - 38.6 U/mL 27.2 31.9 27.0 35.6 32.9    REVIEW OF SYSTEMS: Debbie [name] is doing quite well today.  She continues to have right arm lymphedema and has maintained a positive attitude about it.  She wears her sleeve  and glove daily.  She a chronic cough that is longstanding and unchanged.  She had one episode of right chest wall pain that has now resolved.  She denies any new issues such as fever, chills, chest pain, palpitations, shortness of breath, mucositis, headaches, vision issues, nausea, vomiting, bowel/bladder changes, skin issues, or any other concerns.  A detailed ROS was otherwise non contributory.    BREAST CANCER HISTORY: From the original intake note:  Debbie [name] had screening mammography with tomosynthesis at the [facility] 10/02/2015. This showed 2 possible masses in the right breast as well as some suspicious calcifications. The left breast was unremarkable. On 10/15/2015 she underwent right diagnostic mammography with tomography and right breast ultrasonography. The breast density was category B. Spot magnification views confirmed an area of pleomorphic calcifications measuring up to 5 cm in the upper half of the breast. Also in the upper outer right breast there were adjacent low-density masses without distortion or calcifications. On physical exam there was no palpable abnormality.  Targeted ultrasound of the right breast found the 2 "masses" in the right upper quadrant were benign cysts measuring 0.8 and 0.5 cm respectively. Biopsy of the area of calcification on 10/15/2015 showed ([id number]) invasive ductal carcinoma, E-cadherin positive, grade 2, estrogen receptor 90% positive, progesterone receptor 50% positive, both with strong staining intensity, with an MIB-1 of 70%, and no HER-2 amplification, the signals ratio being 1.18 and the number per cell 2.60.  Her subsequent history is as detailed below    PAST MEDICAL HISTORY: Past Medical History:  Diagnosis Date  . Anxiety   . Arthritis   . Bone metastasis ([place])   . Breast cancer of  upper-inner quadrant of right female breast [facility]) oncologist-  dr [name]--- 04/ 2019 ER/PR+ Stage IV w/ metastatic disease to liver, total spine,  lung, bone   dx 06/ 2017  right breast invasive ductal carcinoma, Stage IIB, ER/PR + (pT2 pN1), grade 1-- s/p  right breast lumpecotmy w/ sln bx's and bilateral breast reduction 11-28-2015/  left breast with reduction , dx invasive ductal ca, Grade 1 (pT1cpNX) ER/PR positive/  adjuvant radiation completed 03-27-2016 to right chest wall, 2018  reclassifiec Stage IIA  . Cancer, metastatic to liver ([place])   . Chronic back pain   . Constipation   . Depression   . Family history of adverse reaction to anesthesia     mother has Copd was kept on vent   . Headache   . Heart murmur   . History of hyperthyroidism    s/p  RAI  . History of radiation therapy 02/11/16- 03/27/16   Right Chest Wall 50.4 Gy in 28 fractions, Right Chest Wall Boost 10 Gy in 5 fractions.   . Hyperlipidemia   . Hypertension   . Hypothyroidism, postradioiodine therapy    endocrinoloigst-  dr balan--  2004  s/p  RAI  . Menorrhagia 2011  . Multiple pulmonary nodules    secondary  metastatic  . PONV (postoperative nausea and vomiting)    patch helps   . SUI (stress urinary incontinence, female)   . Wears contact lenses   . Wears dentures    full upper and lower partial    PAST SURGICAL HISTORY: Past Surgical History:  Procedure Laterality Date  . BREAST LUMPECTOMY WITH NEEDLE LOCALIZATION AND AXILLARY SENTINEL LYMPH NODE BX Right 11/28/2015   Procedure: RIGHT BREAST LUMPECTOMY WITH NEEDLE LOCALIZATION AND AXILLARY SENTINEL LYMPH NODE BX;  Surgeon: Autumn Messing III, MD;  Location: [place];  Service: General;  Laterality: Right;  . BREAST REDUCTION SURGERY Bilateral 11/28/2015   Procedure: MAMMARY REDUCTION  (BREAST) BILATERAL;  Surgeon: Crissie Reese, MD;  Location: [place];  Service: Plastics;  Laterality: Bilateral;  . CESAREAN SECTION  01/12/1993  . COLONOSCOPY    . Dental surgeries    . FEMUR IM NAIL Left 01/28/2018   Procedure: INTRAMEDULLARY (IM) NAIL FEMORAL;  Surgeon: Nicholes Stairs, MD;  Location: [place];  Service:  Orthopedics;  Laterality: Left;  . HYSTEROSCOPY N/A 09/02/2017   Procedure: HYSTEROSCOPY  to remove IUD;  Surgeon: Eldred Manges, MD;  Location: [facility];  Service: Gynecology;  Laterality: N/A;  . Liver biospy    . MASTECTOMY    . MASTECTOMY W/ SENTINEL NODE BIOPSY Bilateral 12/26/2015   Procedure: BILATERAL MASTECTOMY WITH LEFT SENTINEL LYMPH NODE BIOPSY;  Surgeon: Autumn Messing III, MD;  Location: [place];  Service: General;  Laterality: Bilateral;  . Removal of IUD    . TUBAL LIGATION Bilateral 1995    FAMILY HISTORY: Family History  Problem Relation Age of Onset  . Diabetes Mother   . Hypertension Mother   . Hypertension Father   . Diabetes Maternal Grandmother   . Hypertension Sister   . Cancer Paternal Grandmother        colon  . Colon cancer Paternal Grandmother   . Esophageal cancer Neg Hx   . Stomach cancer Neg Hx   . Rectal cancer Neg Hx    The patient's parents are still living, in their early 16s as of June 2017. The patient has one brother, 2 sisters. On the maternal side there is a history of uterine and  prostate cancer. On the paternal side there is a history of colon cancer and possibly uterine cancer. There is no history of breast or ovarian cancer in the family.    GYNECOLOGIC HISTORY:  No LMP recorded. (Menstrual status: IUD).  Menarche age 41. She still having regular periods. She is GX P1, first pregnancy age 67. She used oral contraceptives for a few years remotely, with no complications. She is currently on a Mirena IUD and also is status post bilateral tubal ligation.   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 Her husband Debbie [name] is a Administrator.  They are both taking appropriate precautions at work.  Their son Debbie [name], age 65, is a radio DJ.  He no longer lives with the patient.  The patient has no grandchildren. She is not a Ambulance person.   ADVANCED DIRECTIVES: In the absence of any  documents to the contrary her husband is her healthcare power of attorney   HEALTH MAINTENANCE: Social History   Tobacco Use  . Smoking status: Never Smoker  . Smokeless tobacco: Never Used  Vaping Use  . Vaping Use: Never used  Substance Use Topics  . Alcohol use: No  . Drug use: No     Colonoscopy: January 2016   PAP: January 2016   Bone density: August 2017 showed a T score of +0.7 normal  Lipid panel:  Allergies  Allergen Reactions  . Ace Inhibitors Cough  . Atorvastatin Other (See Comments)    Joint pain  . Simvastatin Other (See Comments)    Joint pain    Current Outpatient Medications  Medication Sig Dispense Refill  . AFINITOR 10 MG tablet TAKE 1 TABLET (10 MG TOTAL) BY MOUTH DAILY. 28 tablet 2  . anastrozole (ARIMIDEX) 1 MG tablet Take 1 tablet (1 mg total) by mouth daily. 90 tablet 4  . Calcium-Magnesium 250-125 MG TABS Take 1 tablet by mouth daily. 120 each 4  . cetirizine (ZYRTEC ALLERGY) 10 MG tablet Take 10 mg by mouth daily as needed for allergies.     . chlorhexidine (PERIDEX) 0.12 % solution     . dexamethasone (DECADRON) 0.5 MG/5ML solution Swish 98m in mouth for 219m and spit out. Use 4 times daily for 8 weeks, start with Afinitor. Avoid eating/drinking for 1hr after rinse. 500 mL 4  . docusate sodium (COLACE) 100 MG capsule Take 200-400 mg by mouth daily.    . Ferrous Sulfate (IRON) 325 (65 Fe) MG TABS Take 1 tablet (325 mg total) by mouth daily. 30 each 2  . hydrocortisone cream 1 % Apply 1 application topically daily as needed (for rash).    . Marland Kitchenbuprofen (ADVIL,MOTRIN) 600 MG tablet Take 1 tablet (600 mg total) by mouth every 6 (six) hours as needed for moderate pain. 60 tablet 0  . levothyroxine (SYNTHROID, LEVOTHROID) 137 MCG tablet Take 137 mcg by mouth daily before breakfast.     . LORazepam (ATIVAN) 0.5 MG tablet Take 1 tablet (0.5 mg total) by mouth at bedtime as needed for anxiety. 30 tablet 0  . losartan (COZAAR) 100 MG tablet Take 100 mg by  mouth every morning.     . Multiple Vitamin (MULTIVITAMIN) tablet Take 1 tablet by mouth daily.    . Marland Kitchenmega-3 acid ethyl esters (LOVAZA) 1 g capsule Take 1 g by mouth 2 (two) times daily.     . potassium chloride SA (K-DUR,KLOR-CON) 20 MEQ tablet Take 40 mEq by mouth daily.     .Marland Kitchen  pregabalin (LYRICA) 100 MG capsule TAKE 1 CAPSULE BY MOUTH THREE TIMES DAILY 90 capsule 0  . prochlorperazine (COMPAZINE) 10 MG tablet Take 1 tablet (10 mg total) by mouth every 6 (six) hours as needed for nausea or vomiting. 30 tablet 0  . rosuvastatin (CRESTOR) 5 MG tablet Take 1 tablet (5 mg total) by mouth daily.    . sertraline (ZOLOFT) 100 MG tablet Take 1 tablet (100 mg total) by mouth daily.    Marland Kitchen triamterene-hydrochlorothiazide (DYAZIDE) 37.5-25 MG capsule TAKE 1 CAPSULE BY MOUTH ONCE DAILY 90 capsule 0  . Vitamin D, Cholecalciferol, 1000 units CAPS Take 1 tablet by mouth daily. (Patient taking differently: Take 1,000 Units by mouth daily. ) 90 capsule 4   No current facility-administered medications for this visit.    OBJECTIVE:    Vitals:   12/12/19 0934  BP: (!) 165/81  Pulse: 99  Resp: 20  Temp: 98.3 F (36.8 C)  SpO2: 98%     Body mass index is 42.67 kg/m.    ECOG FS:1 - Symptomatic but completely ambulatory  Filed Weights   12/12/19 0934  Weight: 268 lb 6.4 oz (121.7 kg)  GENERAL: Patient is a well appearing female in no acute distress HEENT:  Sclerae anicteric. Mask in place. Neck is supple.  NODES:  No cervical, supraclavicular, or axillary lymphadenopathy palpated.  BREAST EXAM:  Deferred. LUNGS:  Clear to auscultation bilaterally.  No wheezes or rhonchi. HEART:  Regular rate and rhythm. No murmur appreciated. ABDOMEN:  Soft, nontender.  Positive, normoactive bowel sounds. No organomegaly palpated. MSK:  No focal spinal tenderness to palpation.  EXTREMITIES:  Right arm lymphedema noted, sleeve and glove are on SKIN:  Clear with no obvious rashes or skin changes. No nail  dyscrasia. NEURO:  Nonfocal. Well oriented.  Appropriate affect.    LAB RESULTS:  CMP     Component Value Date/Time   NA 141 12/12/2019 0917   NA 140 07/08/2016 0948   K 3.2 (L) 12/12/2019 0917   K 4.1 07/08/2016 0948   CL 106 12/12/2019 0917   CO2 29 12/12/2019 0917   CO2 24 07/08/2016 0948   GLUCOSE 101 (H) 12/12/2019 0917   GLUCOSE 89 07/08/2016 0948   BUN 12 12/12/2019 0917   BUN 11.9 07/08/2016 0948   CREATININE 0.82 12/12/2019 0917   CREATININE 0.77 05/16/2019 0905   CREATININE 0.9 07/08/2016 0948   CALCIUM 9.8 12/12/2019 0917   CALCIUM 10.0 07/08/2016 0948   PROT 7.1 12/12/2019 0917   PROT 7.6 07/08/2016 0948   ALBUMIN 3.3 (L) 12/12/2019 0917   ALBUMIN 3.6 07/08/2016 0948   AST 23 12/12/2019 0917   AST 35 05/16/2019 0905   AST 22 07/08/2016 0948   ALT 23 12/12/2019 0917   ALT 40 05/16/2019 0905   ALT 27 07/08/2016 0948   ALKPHOS 166 (H) 12/12/2019 0917   ALKPHOS 115 07/08/2016 0948   BILITOT 0.3 12/12/2019 0917   BILITOT 0.7 05/16/2019 0905   BILITOT 0.35 07/08/2016 0948   GFRNONAA >60 12/12/2019 0917   GFRNONAA >60 05/16/2019 0905   GFRAA >60 12/12/2019 0917   GFRAA >60 05/16/2019 0905    INo results found for: SPEP, UPEP  Lab Results  Component Value Date   WBC 4.8 12/12/2019   NEUTROABS 3.7 12/12/2019   HGB 12.1 12/12/2019   HCT 37.7 12/12/2019   MCV 82.3 12/12/2019   PLT 278 12/12/2019      Chemistry      Component Value Date/Time  NA 141 12/12/2019 0917   NA 140 07/08/2016 0948   K 3.2 (L) 12/12/2019 0917   K 4.1 07/08/2016 0948   CL 106 12/12/2019 0917   CO2 29 12/12/2019 0917   CO2 24 07/08/2016 0948   BUN 12 12/12/2019 0917   BUN 11.9 07/08/2016 0948   CREATININE 0.82 12/12/2019 0917   CREATININE 0.77 05/16/2019 0905   CREATININE 0.9 07/08/2016 0948      Component Value Date/Time   CALCIUM 9.8 12/12/2019 0917   CALCIUM 10.0 07/08/2016 0948   ALKPHOS 166 (H) 12/12/2019 0917   ALKPHOS 115 07/08/2016 0948   AST 23 12/12/2019  0917   AST 35 05/16/2019 0905   AST 22 07/08/2016 0948   ALT 23 12/12/2019 0917   ALT 40 05/16/2019 0905   ALT 27 07/08/2016 0948   BILITOT 0.3 12/12/2019 0917   BILITOT 0.7 05/16/2019 0905   BILITOT 0.35 07/08/2016 0948       No results found for: LABCA2  No components found for: VCBSW967  No results for input(s): INR in the last 168 hours.  Urinalysis No results found for: COLORURINE, APPEARANCEUR, LABSPEC, PHURINE, GLUCOSEU, HGBUR, BILIRUBINUR, KETONESUR, PROTEINUR, UROBILINOGEN, NITRITE, LEUKOCYTESUR   STUDIES: No results found.   ELIGIBLE FOR AVAILABLE RESEARCH PROTOCOL: PALLAS, but inelegible because of Mirena IUD   ASSESSMENT: 54 y.o.  woman status post right breast upper inner quadrant biopsy 10/15/2015 for a clinical T2-T3 N0, stage II invasive ductal carcinoma, estrogen and progesterone receptor positive, HER-2 nonamplified, with an Mib-1 of 70%  (1) status post right lumpectomy and sentinel lymph node sampling with oncoplastic breast reduction 11/28/2015 for a pT2 pN1, stage IIB invasive ductal carcinoma, grade 1, with negative margins (1/1 sentinel node positive)  (a) reclassified as stage IIA in the 2018 prognostic revision   (2) left reduction mammoplasty 11/28/2015 unexpectedly found a pT1c pNX invasive ductal carcinoma, grade 1, estrogen and progesterone receptor positive, HER-2 not amplified, with an MIB-1 of 3%  (3) Mammaprint from the right sided breast cancer was "low risk", predicting a 98% chance of no disease recurrence within 5 years with anti-estrogens as the only systemic therapy. It also predicts minimal to no benefit from chemotherapy  (4) status post bilateral mastectomies with bilateral sentinel lymph node sampling 12/26/2015, showing  (a) on the right, no residual carcinoma (0/3 nodes involved)  (b) on the left, focal ductal carcinoma in situ, 0.2 cm, with negative margins; (0/4 nodes involved)  (5) adjuvant radiation 02/11/16 -  03/27/16    1) Right chest wall: 50.4 Gy in 28 fractions               2) Right chest wall boost: 10 Gy in 5 fractions   (6) started on tamoxifen neoadjuvantly 10/23/2015 to allow for expected delays in definitive local treatment  (a) Mirena IUD in place  (b) tamoxifen discontinued 08/13/2017, with progression  (7) since she had 4 lymph nodes removed from each axilla but also receive radiation on the right, lab draws should preferentially be obtained from the left arm   METASTATIC DISEASE: April 2019 (8) CT scan of the abdomen and pelvis 08/04/2017, MRI of the liver and total spine MRI 08/11/2017 and CT scan of the chest and brain on 08/13/2017 showed liver and bone metastases  (a) liver biopsy 09/07/2017 confirms estrogen receptor positive, progesterone receptor negative, HER-2 negative metastatic breast cancer  (b) chest CT scan 08/13/2017 shows multiple bilateral pulmonary nodules.  (c) CT of the brain with and without contrast  08/26/2017 shows no evidence of metastatic disease to the brain  (d) CA 27-29 is informative  (9) fulvestrant started 08/17/2017  (a) palbociclib started 08/31/2017  (b) fulvestrant and palbociclib discontinued 01/04/2018 with evidence of progression in the liver  (10) denosumab/Xgeva started 09/14/2017  (a) held after 03/01/2018 dose because concern re. osteonecrosis  (b) resumed 06/09/2018  (c) 02/21/2019 dose held due to oral surgery, resumed 03/21/2019  (11) anastrozole started 01/04/2018  (a) everolimus started 01/29/2018  (b) liver MRI and bone scan December 2019 show stable disease  (c) chest CT scan on 08/02/2018 shows lung lesions resolved, bone lesions stable  (d) bone scan 09/13/2018 essentially stable  (e) liver MRI 12/08/2018 shows improvement in hepatic metastsases  (f) liver MRI and bone scan 04/13/2019 showed continuing improvement in the liver, stable bone lesions  (g) chest CT 08/01/2019 stable, no evidence of active disease ows  (12)  01/28/18 prophylactic left femur intramedullary nail surgery for impending left femur pathologic fracture  (a) palliative radiation with Dr. [name] to the left femur and left ilium from 02/16/2018 to 03/01/2018  (13) [facility] report on liver biopsy from April 2019 shows a negative PDL 1, stable MSI and proficient mismatch repair status.  The tumor is positive for the androgen receptor, and for PTEN for PIK3 CA mutations.  HER-2/neu was read as equivocal (it was negative by FISH on the pathology report).   PLAN: Kayren continues on treatment with Everolimus and Anastrozole with good tolerance.  She has no clinical signs of progression of her metastatic breast cancer.  Her restaging studies have been ordered, but are not yet scheduled.  I have sent a message to my nurse asking that she reach out.    Eymi was cleared by her oral surgeon Dr. Precious Gilding that she can continue on Xgeva.  She will receive this in September when she returns.  I have sent the clearance letter to be scanned.    We discussed healthy diet and exercise today as well.    Semiyah will return in 6 weeks for labs, f/u, and her Xgeva.  She knows to call for any questions that may arise between now and her next appointment.  We are happy to see her sooner if needed.   Total encounter time 20 minutes.Wilber Bihari, NP 12/12/19 10:15 AM Medical Oncology and Hematology [facility] [place], [place] [address] Tel. [phone]    Fax. [phone]   *Total Encounter Time as defined by the Centers for Medicare and Medicaid Services includes, in addition to the face-to-face time of a patient visit (documented in the note above) non-face-to-face time: obtaining and reviewing outside history, ordering and reviewing medications, tests or procedures, care coordination (communications with other health care professionals or caregivers) and documentation in the medical record.

## 2019-12-11 NOTE — Telephone Encounter (Signed)
Oral Oncology Patient Advocate Encounter  Received notification from [place] that prior authorization for Afinitor is required.  PA submitted on CoverMyMeds Key [id number] Status is pending  Oral Oncology Clinic will continue to follow.  [place] Patient [place] Phone [phone] Fax [phone] 12/11/2019 8:25 AM

## 2019-12-12 ENCOUNTER — Encounter: Payer: Self-pay | Admitting: Adult Health

## 2019-12-12 ENCOUNTER — Other Ambulatory Visit: Payer: Self-pay

## 2019-12-12 ENCOUNTER — Inpatient Hospital Stay (HOSPITAL_BASED_OUTPATIENT_CLINIC_OR_DEPARTMENT_OTHER): Payer: 59 | Admitting: Adult Health

## 2019-12-12 ENCOUNTER — Inpatient Hospital Stay: Payer: 59

## 2019-12-12 ENCOUNTER — Inpatient Hospital Stay: Payer: 59 | Attending: Oncology

## 2019-12-12 VITALS — BP 165/81 | HR 99 | Temp 98.3°F | Resp 20 | Ht 66.5 in | Wt 268.4 lb

## 2019-12-12 DIAGNOSIS — Z170 Estrogen receptor positive status [ER+]: Secondary | ICD-10-CM

## 2019-12-12 DIAGNOSIS — Z79811 Long term (current) use of aromatase inhibitors: Secondary | ICD-10-CM | POA: Diagnosis not present

## 2019-12-12 DIAGNOSIS — C787 Secondary malignant neoplasm of liver and intrahepatic bile duct: Secondary | ICD-10-CM | POA: Diagnosis not present

## 2019-12-12 DIAGNOSIS — C50211 Malignant neoplasm of upper-inner quadrant of right female breast: Secondary | ICD-10-CM | POA: Insufficient documentation

## 2019-12-12 DIAGNOSIS — Z9013 Acquired absence of bilateral breasts and nipples: Secondary | ICD-10-CM | POA: Insufficient documentation

## 2019-12-12 DIAGNOSIS — C50112 Malignant neoplasm of central portion of left female breast: Secondary | ICD-10-CM

## 2019-12-12 DIAGNOSIS — Z800 Family history of malignant neoplasm of digestive organs: Secondary | ICD-10-CM | POA: Diagnosis not present

## 2019-12-12 DIAGNOSIS — C50012 Malignant neoplasm of nipple and areola, left female breast: Secondary | ICD-10-CM

## 2019-12-12 DIAGNOSIS — C7951 Secondary malignant neoplasm of bone: Secondary | ICD-10-CM | POA: Insufficient documentation

## 2019-12-12 DIAGNOSIS — Z79899 Other long term (current) drug therapy: Secondary | ICD-10-CM | POA: Insufficient documentation

## 2019-12-12 DIAGNOSIS — C50011 Malignant neoplasm of nipple and areola, right female breast: Secondary | ICD-10-CM

## 2019-12-12 LAB — COMPREHENSIVE METABOLIC PANEL
ALT: 23 U/L (ref 0–44)
AST: 23 U/L (ref 15–41)
Albumin: 3.3 g/dL — ABNORMAL LOW (ref 3.5–5.0)
Alkaline Phosphatase: 166 U/L — ABNORMAL HIGH (ref 38–126)
Anion gap: 6 (ref 5–15)
BUN: 12 mg/dL (ref 6–20)
CO2: 29 mmol/L (ref 22–32)
Calcium: 9.8 mg/dL (ref 8.9–10.3)
Chloride: 106 mmol/L (ref 98–111)
Creatinine, Ser: 0.82 mg/dL (ref 0.44–1.00)
GFR calc Af Amer: >60 mL/min (ref >60)
GFR calc non Af Amer: >60 mL/min (ref >60)
Glucose, Bld: 101 mg/dL — ABNORMAL HIGH (ref 70–99)
Potassium: 3.2 mmol/L — ABNORMAL LOW (ref 3.5–5.1)
Sodium: 141 mmol/L (ref 135–145)
Total Bilirubin: 0.3 mg/dL (ref 0.3–1.2)
Total Protein: 7.1 g/dL (ref 6.5–8.1)

## 2019-12-12 LAB — CBC WITH DIFFERENTIAL/PLATELET
Abs Immature Granulocytes: 0.03 10*3/uL (ref 0.00–0.07)
Basophils Absolute: 0 10*3/uL (ref 0.0–0.1)
Basophils Relative: 0 %
Eosinophils Absolute: 0.1 10*3/uL (ref 0.0–0.5)
Eosinophils Relative: 3 %
HCT: 37.7 % (ref 36.0–46.0)
Hemoglobin: 12.1 g/dL (ref 12.0–15.0)
Immature Granulocytes: 1 %
Lymphocytes Relative: 9 %
Lymphs Abs: 0.4 10*3/uL — ABNORMAL LOW (ref 0.7–4.0)
MCH: 26.4 pg (ref 26.0–34.0)
MCHC: 32.1 g/dL (ref 30.0–36.0)
MCV: 82.3 fL (ref 80.0–100.0)
Monocytes Absolute: 0.5 10*3/uL (ref 0.1–1.0)
Monocytes Relative: 10 %
Neutro Abs: 3.7 10*3/uL (ref 1.7–7.7)
Neutrophils Relative %: 77 %
Platelets: 278 10*3/uL (ref 150–400)
RBC: 4.58 MIL/uL (ref 3.87–5.11)
RDW: 15.9 % — ABNORMAL HIGH (ref 11.5–15.5)
WBC: 4.8 10*3/uL (ref 4.0–10.5)
nRBC: 0 % (ref 0.0–0.2)

## 2019-12-13 ENCOUNTER — Telehealth: Payer: Self-pay | Admitting: Adult Health

## 2019-12-13 ENCOUNTER — Ambulatory Visit: Payer: 59 | Admitting: Adult Health

## 2019-12-13 ENCOUNTER — Other Ambulatory Visit: Payer: 59

## 2019-12-13 ENCOUNTER — Telehealth: Payer: Self-pay

## 2019-12-13 LAB — CANCER ANTIGEN 27.29: CA 27.29: 31.4 U/mL (ref 0.0–38.6)

## 2019-12-13 MED FILL — AFINITOR 10 MG TABLET: 10 | 28 days supply | Qty: 28 | Fill #0

## 2019-12-13 NOTE — Telephone Encounter (Signed)
-----   Message from Gardenia Phlegm, NP sent at 12/12/2019  1:23 PM EDT ----- Ppotassium slightly decreased.  Recommend potassium rich diet.  Please make sure her scans get scheduled ----- Message ----- From: Interface, Lab In [place] Sent: 12/12/2019   9:29 AM EDT To: Chauncey Cruel, MD

## 2019-12-13 NOTE — Telephone Encounter (Signed)
Pt has been scheduled for MRI and NM bone scan per below message. Pt understands she should arrive 8/31 at 0730 for MRI, then will have bone scan inj 0900 with a return at 1200 for scan. Pt understands she is to remain NPO 4 hours before visit, but is allowed to eat after bone scan inj. Pt verbalized thanks and understanding.

## 2019-12-13 NOTE — Telephone Encounter (Signed)
No 8/3 los. No changes made to pt's schedule.

## 2019-12-21 DIAGNOSIS — E039 Hypothyroidism, unspecified: Secondary | ICD-10-CM | POA: Diagnosis not present

## 2019-12-21 DIAGNOSIS — C787 Secondary malignant neoplasm of liver and intrahepatic bile duct: Secondary | ICD-10-CM | POA: Diagnosis not present

## 2019-12-21 DIAGNOSIS — I10 Essential (primary) hypertension: Secondary | ICD-10-CM | POA: Diagnosis not present

## 2019-12-21 DIAGNOSIS — F39 Unspecified mood [affective] disorder: Secondary | ICD-10-CM | POA: Diagnosis not present

## 2019-12-21 DIAGNOSIS — E7800 Pure hypercholesterolemia, unspecified: Secondary | ICD-10-CM | POA: Diagnosis not present

## 2019-12-21 DIAGNOSIS — D0510 Intraductal carcinoma in situ of unspecified breast: Secondary | ICD-10-CM | POA: Diagnosis not present

## 2019-12-21 DIAGNOSIS — R7303 Prediabetes: Secondary | ICD-10-CM | POA: Diagnosis not present

## 2019-12-21 DIAGNOSIS — C7951 Secondary malignant neoplasm of bone: Secondary | ICD-10-CM | POA: Diagnosis not present

## 2019-12-21 MED FILL — ANASTROZOLE 1 MG TABLET: 1 | 90 days supply | Qty: 90 | Fill #2

## 2019-12-28 NOTE — Telephone Encounter (Signed)
Oral Oncology Patient Advocate Encounter  Prior Authorization for Afinitor has been approved.    PA# [id number] Effective dates: 12/13/19 through 12/11/20  Patients co-pay is $0  Oral Oncology Clinic will continue to follow.   [name] Patient [place] Phone [phone] Fax [phone] 12/28/2019 8:42 AM

## 2020-01-08 ENCOUNTER — Other Ambulatory Visit: Payer: Self-pay | Admitting: Oncology

## 2020-01-08 ENCOUNTER — Other Ambulatory Visit (HOSPITAL_COMMUNITY): Payer: Self-pay | Admitting: Family Medicine

## 2020-01-08 MED FILL — AFINITOR 10 MG TABLET: 10 | 28 days supply | Qty: 28 | Fill #1

## 2020-01-08 MED FILL — SERTRALINE HCL 100 MG TABS: 100 | 30 days supply | Qty: 30 | Fill #6

## 2020-01-08 MED FILL — PREGABALIN 100 MG CAPS: 100 | 30 days supply | Qty: 90 | Fill #0

## 2020-01-08 MED FILL — LEVOTHYROXINE 137 MCG TABLE: 137 | 90 days supply | Qty: 90 | Fill #0

## 2020-01-08 MED FILL — ROSUVASTATIN CALCIUM 5 MG T: 5 | 90 days supply | Qty: 90 | Fill #0

## 2020-01-09 ENCOUNTER — Encounter (HOSPITAL_COMMUNITY)
Admission: RE | Admit: 2020-01-09 | Discharge: 2020-01-09 | Disposition: A | Discharge status: Home / Self Care | Payer: 59 | Source: Ambulatory Visit | Attending: Adult Health | Admitting: Adult Health

## 2020-01-09 ENCOUNTER — Ambulatory Visit (HOSPITAL_COMMUNITY)
Admission: RE | Admit: 2020-01-09 | Discharge: 2020-01-09 | Disposition: A | Discharge status: Home / Self Care | Payer: 59 | Source: Ambulatory Visit | Attending: Adult Health | Admitting: Adult Health

## 2020-01-09 ENCOUNTER — Other Ambulatory Visit: Payer: Self-pay

## 2020-01-09 ENCOUNTER — Other Ambulatory Visit: Payer: Self-pay | Admitting: Oncology

## 2020-01-09 DIAGNOSIS — Z170 Estrogen receptor positive status [ER+]: Secondary | ICD-10-CM | POA: Insufficient documentation

## 2020-01-09 DIAGNOSIS — K769 Liver disease, unspecified: Secondary | ICD-10-CM | POA: Diagnosis not present

## 2020-01-09 DIAGNOSIS — C50211 Malignant neoplasm of upper-inner quadrant of right female breast: Secondary | ICD-10-CM | POA: Diagnosis not present

## 2020-01-09 DIAGNOSIS — C50919 Malignant neoplasm of unspecified site of unspecified female breast: Secondary | ICD-10-CM | POA: Diagnosis not present

## 2020-01-09 DIAGNOSIS — C787 Secondary malignant neoplasm of liver and intrahepatic bile duct: Secondary | ICD-10-CM | POA: Diagnosis not present

## 2020-01-09 DIAGNOSIS — C7951 Secondary malignant neoplasm of bone: Secondary | ICD-10-CM | POA: Diagnosis not present

## 2020-01-09 DIAGNOSIS — M170 Bilateral primary osteoarthritis of knee: Secondary | ICD-10-CM | POA: Diagnosis not present

## 2020-01-09 DIAGNOSIS — N281 Cyst of kidney, acquired: Secondary | ICD-10-CM | POA: Diagnosis not present

## 2020-01-09 MED ORDER — GADOBUTROL 1 MMOL/ML IV SOLN
10.0000 mL | Freq: Once | INTRAVENOUS | Status: AC | PRN
  Administered 2020-01-09: 10 mL via INTRAVENOUS

## 2020-01-09 MED ORDER — TECHNETIUM TC 99M MEDRONATE IV KIT
20.0000 | PACK | Freq: Once | INTRAVENOUS | Status: AC | PRN
  Administered 2020-01-09: 20 via INTRAVENOUS

## 2020-01-10 ENCOUNTER — Other Ambulatory Visit: Payer: Self-pay | Admitting: Oncology

## 2020-01-12 ENCOUNTER — Encounter: Payer: Self-pay | Admitting: Adult Health

## 2020-01-12 ENCOUNTER — Other Ambulatory Visit (HOSPITAL_COMMUNITY): Payer: 59

## 2020-01-22 ENCOUNTER — Telehealth: Payer: Self-pay | Admitting: Pharmacist

## 2020-01-22 ENCOUNTER — Other Ambulatory Visit: Payer: Self-pay | Admitting: Oncology

## 2020-01-22 ENCOUNTER — Telehealth: Payer: Self-pay

## 2020-01-22 DIAGNOSIS — C50112 Malignant neoplasm of central portion of left female breast: Secondary | ICD-10-CM

## 2020-01-22 DIAGNOSIS — Z170 Estrogen receptor positive status [ER+]: Secondary | ICD-10-CM

## 2020-01-22 MED ORDER — ALPELISIB (200 MG DAILY DOSE) 200 MG PO TBPK: 300.0000 mg | ORAL_TABLET | Freq: Every day | ORAL | 6 refills | Status: DC

## 2020-01-22 MED ORDER — PIQRAY (300 MG DAILY DOSE) 2 X 150 MG PO TBPK: ORAL_TABLET | ORAL | 6 refills | Status: DC

## 2020-01-22 NOTE — Telephone Encounter (Signed)
Oral Oncology Patient Advocate Encounter  Received notification from [place] that prior authorization for Piqray is required.  PA submitted on CoverMyMeds Key [id number] Status is pending  Oral Oncology Clinic will continue to follow.  [place] Patient [place] Phone [phone] Fax [phone] 01/22/2020 3:32 PM

## 2020-01-22 NOTE — Progress Notes (Signed)
I called radiology today to make sure we were seeing progression and as detailed in the addendum unfortunately we are.  Accordingly we are going to stop the exemestane/everolimus.  We are going to move to fulvestrant and alpelisib.  Debbie [name] is coming 01/24/2020 to receive her next denosumab shot, which we of course will continue.  She will see [name] that day and I will Pap and to discuss this further.  Hopefully we can start the fulvestrant and alpelisib the following week.

## 2020-01-24 ENCOUNTER — Inpatient Hospital Stay: Payer: 59

## 2020-01-24 ENCOUNTER — Encounter: Payer: Self-pay | Admitting: Adult Health

## 2020-01-24 ENCOUNTER — Inpatient Hospital Stay (HOSPITAL_BASED_OUTPATIENT_CLINIC_OR_DEPARTMENT_OTHER): Payer: 59 | Admitting: Adult Health

## 2020-01-24 ENCOUNTER — Other Ambulatory Visit: Payer: Self-pay

## 2020-01-24 ENCOUNTER — Telehealth: Payer: Self-pay | Admitting: *Deleted

## 2020-01-24 ENCOUNTER — Inpatient Hospital Stay: Payer: 59 | Attending: Oncology

## 2020-01-24 VITALS — BP 173/94 | HR 99 | Temp 97.1°F | Resp 20 | Ht 66.5 in | Wt 267.1 lb

## 2020-01-24 VITALS — BP 182/87 | HR 92 | Temp 98.4°F | Resp 24

## 2020-01-24 DIAGNOSIS — Z170 Estrogen receptor positive status [ER+]: Secondary | ICD-10-CM | POA: Insufficient documentation

## 2020-01-24 DIAGNOSIS — C50112 Malignant neoplasm of central portion of left female breast: Secondary | ICD-10-CM

## 2020-01-24 DIAGNOSIS — C787 Secondary malignant neoplasm of liver and intrahepatic bile duct: Secondary | ICD-10-CM | POA: Diagnosis not present

## 2020-01-24 DIAGNOSIS — Z79899 Other long term (current) drug therapy: Secondary | ICD-10-CM | POA: Insufficient documentation

## 2020-01-24 DIAGNOSIS — C50211 Malignant neoplasm of upper-inner quadrant of right female breast: Secondary | ICD-10-CM

## 2020-01-24 DIAGNOSIS — C7951 Secondary malignant neoplasm of bone: Secondary | ICD-10-CM | POA: Insufficient documentation

## 2020-01-24 DIAGNOSIS — C50011 Malignant neoplasm of nipple and areola, right female breast: Secondary | ICD-10-CM

## 2020-01-24 DIAGNOSIS — Z9013 Acquired absence of bilateral breasts and nipples: Secondary | ICD-10-CM | POA: Diagnosis not present

## 2020-01-24 DIAGNOSIS — C50012 Malignant neoplasm of nipple and areola, left female breast: Secondary | ICD-10-CM

## 2020-01-24 LAB — COMPREHENSIVE METABOLIC PANEL
ALT: 21 U/L (ref 0–44)
AST: 22 U/L (ref 15–41)
Albumin: 3.2 g/dL — ABNORMAL LOW (ref 3.5–5.0)
Alkaline Phosphatase: 183 U/L — ABNORMAL HIGH (ref 38–126)
Anion gap: 8 (ref 5–15)
BUN: 14 mg/dL (ref 6–20)
CO2: 29 mmol/L (ref 22–32)
Calcium: 9.7 mg/dL (ref 8.9–10.3)
Chloride: 104 mmol/L (ref 98–111)
Creatinine, Ser: 0.81 mg/dL (ref 0.44–1.00)
GFR calc Af Amer: >60 mL/min (ref >60)
GFR calc non Af Amer: >60 mL/min (ref >60)
Glucose, Bld: 109 mg/dL — ABNORMAL HIGH (ref 70–99)
Potassium: 3.5 mmol/L (ref 3.5–5.1)
Sodium: 141 mmol/L (ref 135–145)
Total Bilirubin: 0.3 mg/dL (ref 0.3–1.2)
Total Protein: 7.4 g/dL (ref 6.5–8.1)

## 2020-01-24 LAB — CBC WITH DIFFERENTIAL/PLATELET
Abs Immature Granulocytes: 0.02 10*3/uL (ref 0.00–0.07)
Basophils Absolute: 0 10*3/uL (ref 0.0–0.1)
Basophils Relative: 1 %
Eosinophils Absolute: 0.2 10*3/uL (ref 0.0–0.5)
Eosinophils Relative: 3 %
HCT: 37.9 % (ref 36.0–46.0)
Hemoglobin: 12 g/dL (ref 12.0–15.0)
Immature Granulocytes: 1 %
Lymphocytes Relative: 10 %
Lymphs Abs: 0.4 10*3/uL — ABNORMAL LOW (ref 0.7–4.0)
MCH: 26.1 pg (ref 26.0–34.0)
MCHC: 31.7 g/dL (ref 30.0–36.0)
MCV: 82.6 fL (ref 80.0–100.0)
Monocytes Absolute: 0.4 10*3/uL (ref 0.1–1.0)
Monocytes Relative: 10 %
Neutro Abs: 3.4 10*3/uL (ref 1.7–7.7)
Neutrophils Relative %: 75 %
Platelets: 302 10*3/uL (ref 150–400)
RBC: 4.59 MIL/uL (ref 3.87–5.11)
RDW: 16.6 % — ABNORMAL HIGH (ref 11.5–15.5)
WBC: 4.4 10*3/uL (ref 4.0–10.5)
nRBC: 0 % (ref 0.0–0.2)

## 2020-01-24 MED ORDER — DENOSUMAB 120 MG/1.7ML ~~LOC~~ SOLN
120.0000 mg | Freq: Once | SUBCUTANEOUS | Status: AC
  Administered 2020-01-24: 120 mg via SUBCUTANEOUS

## 2020-01-24 MED ORDER — BLOOD GLUCOSE MONITOR KIT: PACK | 0 refills | Status: DC

## 2020-01-24 MED ORDER — DENOSUMAB 120 MG/1.7ML ~~LOC~~ SOLN
SUBCUTANEOUS | Status: AC
  Filled 2020-01-24: qty 1.7

## 2020-01-24 MED ORDER — ONETOUCH ULTRASOFT LANCETS MISC: 12 refills | Status: DC

## 2020-01-24 MED FILL — FREESTYLE LITE METER: 30 days supply | Qty: 1 | Fill #0

## 2020-01-24 MED FILL — FREESTYLE LITE TEST STRIP: 25 days supply | Qty: 100 | Fill #0

## 2020-01-24 MED FILL — FREESTYLE LANCETS: 25 days supply | Qty: 100 | Fill #0

## 2020-01-24 NOTE — Patient Instructions (Signed)
Alpelisib tablets What is this medicine? Alpelisib (AL pe LIS ib) is a medicine that targets proteins in cancer cells and stops the cells from growing. It is used to treat breast cancer. This medicine may be used for other purposes; ask your health care provider or pharmacist if you have questions. COMMON BRAND NAME(S): PIQRAY What should I tell my health care provider before I take this medicine? They need to know if you have any of these conditions:  diabetes  lung or breathing disease, like asthma  an unusual or allergic reaction to alpelisib, other medicines, foods, dyes, or preservatives  pregnant or trying to get pregnant  breast-feeding How should I use this medicine? Take this medicine by mouth with a glass of water. Follow the directions on the prescription label. Do not cut, crush or chew this medicine. Take this medicine with food. Take your medicine at regular intervals. Do not take it more often than directed. Do not stop taking except on your doctor's advice. Talk to your pediatrician regarding the use of this medicine in children. Special care may be needed. Overdosage: If you think you have taken too much of this medicine contact a poison control center or emergency room at once. NOTE: This medicine is only for you. Do not share this medicine with others. What if I miss a dose? If you miss a dose, take it as soon as you can. If your next dose is to be taken in less than 15 hours, then do not take the missed dose. Take the next dose at your regular time. Do not take double or extra doses. What may interact with this medicine? This medicine may interact with the following medications:  certain medicines for seizures like carbamazepine, phenobarbital, phenytoin  cyclosporine  eltrombopag  enzalutamide  mitotane  rifampin  St. John's Wort  sulfasalazine  wafarin This list may not describe all possible interactions. Give your health care provider a list of all  the medicines, herbs, non-prescription drugs, or dietary supplements you use. Also tell them if you smoke, drink alcohol, or use illegal drugs. Some items may interact with your medicine. What should I watch for while using this medicine? This drug may make you feel generally unwell. This is not uncommon, as chemotherapy can affect healthy cells as well as cancer cells. Report any side effects. Continue your course of treatment even though you feel ill unless your doctor tells you to stop. This medicine may cause serious skin reactions. They can happen weeks to months after starting the medicine. Contact your health care provider right away if you notice fevers or flu-like symptoms with a rash. The rash may be red or purple and then turn into blisters or peeling of the skin. Or, you might notice a red rash with swelling of the face, lips or lymph nodes in your neck or under your arms. Check with your doctor or health care provider if you get an attack of severe diarrhea, nausea, and vomiting, or if you sweat a lot. The loss of too much body fluid can make it dangerous for you to take this medicine. You may need blood work done while you are taking this medicine. Your mouth may get dry. Chewing sugarless gum or sucking hard candy, and drinking plenty of water may help. Contact your doctor if the problem does not go away or is severe. Call your doctor or health care provider for advice if you get a fever, chills or sore throat, or other symptoms of  a cold or flu. Do not treat yourself. This drug decreases your body's ability to fight infections. Try to avoid being around people who are sick. This medicine may increase your risk to bruise or bleed. Call your doctor or health care provider if you notice any unusual bleeding. Do not become pregnant while taking this medicine or for 1 week after stopping it. Women should inform their doctor if they wish to become pregnant or think they might be pregnant. Men  should not father a child while taking this medicine and for 1 week after stopping it. There is a potential for serious side effects to an unborn child. Talk to your health care provider or pharmacist for more information. Do not breast-feed an infant while taking this medicine or for 1 week after stopping it. This medicine may interfere with the ability to have a child. Talk with your doctor or health care provider if you are concerned about your fertility. What side effects may I notice from receiving this medicine? Side effects that you should report to your doctor or health care professional as soon as possible:  allergic reactions like skin rash, itching, or hives, swelling of the face, lips, or tongue  breathing problems  diarrhea  jaw pain, especially after dental work  redness, blistering, peeling, or loosening of the skin, including inside the mouth  signs and symptoms of high blood sugar such as dizziness; dry mouth; dry skin; fruity breath; nausea; stomach pain; increased hunger or thirst; increased urination  signs of infection - fever or chills, cough, sore throat, pain or difficulty passing urine  unusual bleeding or bruising Side effects that usually do not require medical attention (report these to your doctor or health care professional if they continue or are bothersome):  changes in taste  dry mouth  headache  loss of appetite  mouth sores  nausea, vomiting  stomach pain This list may not describe all possible side effects. Call your doctor for medical advice about side effects. You may report side effects to FDA at 1-800-FDA-1088. Where should I keep my medicine? Keep out of the reach of children. Store between 20 and 25 degrees C (68 and 77 degrees F). Throw away any unused medicine after the expiration date. NOTE: This sheet is a summary. It may not cover all possible information. If you have questions about this medicine, talk to your doctor, pharmacist,  or health care provider.  2020 [employer] (2018-07-19 09:49:08)

## 2020-01-24 NOTE — Progress Notes (Signed)
[place]  [phone] [phone]    ID: Debbie [name] DOB: [birth year]  MR#: [id number]  CWC#:[id number]  Patient Care Team: Kelton Pillar, MD as PCP - General (Family Medicine) Jacelyn Pi, MD as Consulting Physician (Endocrinology) Jovita Kussmaul, MD as Consulting Physician (General Surgery) [name], Virgie Dad, MD as Consulting Physician (Oncology) [name], MD as Attending Physician (Radiation Oncology) Belva Crome, MD as Consulting Physician (Cardiology) Crissie Reese, MD as Consulting Physician (Plastic Surgery) [name], [name], NP as Nurse Practitioner (Hematology and Oncology) Margarette Canada (Inactive) Lajoyce Lauber, DMD (Dentistry) OTHER MD:   CHIEF COMPLAINT: Estrogen receptor positive stage IV  breast cancer (s/p bilateral mastectomies)  CURRENT TREATMENT: Fulvestrant; Alepelisib; denosumab/Xgeva   INTERVAL HISTORY: Debbie [name] returns today for follow-up and treatment of her estrogen receptor positive stage IV breast cancer.  Since her last visit she underwent MRI liver and bone scan on 01/10/2020.  Her bone scan was stable and showed no new osseous metastatic lesions.  Her MRI of the abdomen on 01/09/2020 showed progression in her liver metastases.    She talked with Dr. [name] about this on 01/22/2020 and she is going to change therapy to Fulvestrant and Alpelisib.  She will continue on Xgeva every every 12 weeks.  She notes that she is prediabetic and has been on metformin previously.  She notes her most recent A1c was 6.     REVIEW OF SYSTEMS: Debbie [name] is doing moderately well.  She has occasional back and hip pain and normally takes advil for this.  She has tramadol to take if needed.  She takes advil once every 1-2 weeks, and tramadol even less than that. PMP aware reviewed, last tramadol fill in 10/2019.  Her pain is controlled and she is able to function normally.    Debbie [name]'s most recent menstrual  cycle was in 2015.  She did have an IUD that was removed a couple years ago.    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    She denies any new issues, fever, chills, cough, shortness of breath, chest pain, palpitations, bowel/bladder changes, headaches, vision issues, or other concerns.  A detailed ROS Was otherwise non contributory.  BREAST CANCER HISTORY: From the original intake note:  K[place]et had screening mammography with tomosynthesis at the [facility] 10/02/2015. This showed 2 possible masses in the right breast as well as some suspicious calcifications. The left breast was unremarkable. On 10/15/2015 she underwent right diagnostic mammography with tomography and right breast ultrasonography. The breast density was category B. Spot magnification views confirmed an area of pleomorphic calcifications measuring up to 5 cm in the upper half of the breast. Also in the upper outer right breast there were adjacent low-density masses without distortion or calcifications. On physical exam there was no palpable abnormality.  Targeted ultrasound of the right breast found the 2 "masses" in the right upper quadrant were benign cysts measuring 0.8 and 0.5 cm respectively. Biopsy of the area of calcification on 10/15/2015 showed ([id number]) invasive ductal carcinoma, E-cadherin positive, grade 2, estrogen receptor 90% positive, progesterone receptor 50% positive, both with strong staining intensity, with an MIB-1 of 70%, and no HER-2 amplification, the signals ratio being 1.18 and the number per cell 2.60.  Her subsequent history is as detailed below    PAST MEDICAL HISTORY: Past Medical History:  Diagnosis Date  . Anxiety   . Arthritis   . Bone metastasis ([place])   .  Breast cancer of upper-inner quadrant of right female breast [facility]) oncologist-  dr [name]--- 04/ 2019 ER/PR+ Stage IV w/ metastatic disease to liver, total spine, lung, bone   dx 06/ 2017   right breast invasive ductal carcinoma, Stage IIB, ER/PR + (pT2 pN1), grade 1-- s/p  right breast lumpecotmy w/ sln bx's and bilateral breast reduction 11-28-2015/  left breast with reduction , dx invasive ductal ca, Grade 1 (pT1cpNX) ER/PR positive/  adjuvant radiation completed 03-27-2016 to right chest wall, 2018  reclassifiec Stage IIA  . Cancer, metastatic to liver ([place])   . Chronic back pain   . Constipation   . Depression   . Family history of adverse reaction to anesthesia     mother has Copd was kept on vent   . Headache   . Heart murmur   . History of hyperthyroidism    s/p  RAI  . History of radiation therapy 02/11/16- 03/27/16   Right Chest Wall 50.4 Gy in 28 fractions, Right Chest Wall Boost 10 Gy in 5 fractions.   . Hyperlipidemia   . Hypertension   . Hypothyroidism, postradioiodine therapy    endocrinoloigst-  dr balan--  2004  s/p  RAI  . Menorrhagia 2011  . Multiple pulmonary nodules    secondary  metastatic  . PONV (postoperative nausea and vomiting)    patch helps   . SUI (stress urinary incontinence, female)   . Wears contact lenses   . Wears dentures    full upper and lower partial    PAST SURGICAL HISTORY: Past Surgical History:  Procedure Laterality Date  . BREAST LUMPECTOMY WITH NEEDLE LOCALIZATION AND AXILLARY SENTINEL LYMPH NODE BX Right 11/28/2015   Procedure: RIGHT BREAST LUMPECTOMY WITH NEEDLE LOCALIZATION AND AXILLARY SENTINEL LYMPH NODE BX;  Surgeon: Autumn Messing III, MD;  Location: [place];  Service: General;  Laterality: Right;  . BREAST REDUCTION SURGERY Bilateral 11/28/2015   Procedure: MAMMARY REDUCTION  (BREAST) BILATERAL;  Surgeon: Crissie Reese, MD;  Location: [facility];  Service: Plastics;  Laterality: Bilateral;  . CESAREAN SECTION  01/12/1993  . COLONOSCOPY    . Dental surgeries    . FEMUR IM NAIL Left 01/28/2018   Procedure: INTRAMEDULLARY (IM) NAIL FEMORAL;  Surgeon: Nicholes Stairs, MD;  Location: [facility];  Service: Orthopedics;  Laterality:  Left;  . HYSTEROSCOPY N/A 09/02/2017   Procedure: HYSTEROSCOPY  to remove IUD;  Surgeon: Eldred Manges, MD;  Location: [facility];  Service: Gynecology;  Laterality: N/A;  . Liver biospy    . MASTECTOMY    . MASTECTOMY W/ SENTINEL NODE BIOPSY Bilateral 12/26/2015   Procedure: BILATERAL MASTECTOMY WITH LEFT SENTINEL LYMPH NODE BIOPSY;  Surgeon: Autumn Messing III, MD;  Location: [facility];  Service: General;  Laterality: Bilateral;  . Removal of IUD    . TUBAL LIGATION Bilateral 1995    FAMILY HISTORY: Family History  Problem Relation Age of Onset  . Diabetes Mother   . Hypertension Mother   . Hypertension Father   . Diabetes Maternal Grandmother   . Hypertension Sister   . Cancer Paternal Grandmother        colon  . Colon cancer Paternal Grandmother   . Esophageal cancer Neg Hx   . Stomach cancer Neg Hx   . Rectal cancer Neg Hx    The patient's parents are still living, in their early 64s as of June 2017. The patient has one brother, 2 sisters. On the maternal side there is a history  of uterine and prostate cancer. On the paternal side there is a history of colon cancer and possibly uterine cancer. There is no history of breast or ovarian cancer in the family.    GYNECOLOGIC HISTORY:  No LMP recorded. (Menstrual status: IUD).  Menarche age 55. She still having regular periods. She is GX P1, first pregnancy age 37. She used oral contraceptives for a few years remotely, with no complications. She is currently on a Mirena IUD and also is status post bilateral tubal ligation.   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 Her husband [name] is a Administrator.  They are both taking appropriate precautions at work.  Their son [name], age 4, is a radio DJ.  He no longer lives with the patient.  The patient has no grandchildren. She is not a Ambulance person.   ADVANCED DIRECTIVES: In the absence of any documents to the contrary  her husband is her healthcare power of attorney   HEALTH MAINTENANCE: Social History   Tobacco Use  . Smoking status: Never Smoker  . Smokeless tobacco: Never Used  Vaping Use  . Vaping Use: Never used  Substance Use Topics  . Alcohol use: No  . Drug use: No     Colonoscopy: January 2016   PAP: January 2016   Bone density: August 2017 showed a T score of +0.7 normal  Lipid panel:  Allergies  Allergen Reactions  . Ace Inhibitors Cough  . Atorvastatin Other (See Comments)    Joint pain  . Simvastatin Other (See Comments)    Joint pain    Current Outpatient Medications  Medication Sig Dispense Refill  . alpelisib (PIQRAY, 300 MG DAILY DOSE,) 2 x 150 MG Therapy Pack Take two 149m tablets with food at the same time daily. Swallow whole, do not crush, chew, or split. 28 each 6  . Calcium-Magnesium 250-125 MG TABS Take 1 tablet by mouth daily. 120 each 4  . cetirizine (ZYRTEC ALLERGY) 10 MG tablet Take 10 mg by mouth daily as needed for allergies.     . chlorhexidine (PERIDEX) 0.12 % solution     . docusate sodium (COLACE) 100 MG capsule Take 200-400 mg by mouth daily.    . Ferrous Sulfate (IRON) 325 (65 Fe) MG TABS Take 1 tablet (325 mg total) by mouth daily. 30 each 2  . furosemide (LASIX) 20 MG tablet Take 20 mg by mouth daily as needed.    . hydrocortisone cream 1 % Apply 1 application topically daily as needed (for rash).    .Marland Kitchenibuprofen (ADVIL) 800 MG tablet Take 800 mg by mouth every 6 (six) hours as needed.    .Marland Kitchenibuprofen (ADVIL,MOTRIN) 600 MG tablet Take 1 tablet (600 mg total) by mouth every 6 (six) hours as needed for moderate pain. 60 tablet 0  . levothyroxine (SYNTHROID, LEVOTHROID) 137 MCG tablet Take 137 mcg by mouth daily before breakfast.     . LORazepam (ATIVAN) 0.5 MG tablet Take 1 tablet (0.5 mg total) by mouth at bedtime as needed for anxiety. 30 tablet 0  . losartan (COZAAR) 100 MG tablet Take 100 mg by mouth every morning.     .Marland Kitchen losartan-hydrochlorothiazide (HYZAAR) 100-25 MG tablet Take 1 tablet by mouth daily.    . Multiple Vitamin (MULTIVITAMIN) tablet Take 1 tablet by mouth daily.    .Marland Kitchenomega-3 acid ethyl esters (LOVAZA) 1 g capsule Take 1 g by mouth 2 (two) times daily.     .Marland Kitchen  potassium chloride SA (K-DUR,KLOR-CON) 20 MEQ tablet Take 40 mEq by mouth daily.     . pregabalin (LYRICA) 100 MG capsule TAKE 1 CAPSULE BY MOUTH THREE TIMES DAILY 90 capsule 0  . prochlorperazine (COMPAZINE) 10 MG tablet Take 1 tablet (10 mg total) by mouth every 6 (six) hours as needed for nausea or vomiting. 30 tablet 0  . rosuvastatin (CRESTOR) 5 MG tablet Take 1 tablet (5 mg total) by mouth daily.    . sertraline (ZOLOFT) 100 MG tablet Take 1 tablet (100 mg total) by mouth daily.    . traMADol (ULTRAM) 50 MG tablet Take 50 mg by mouth every 6 (six) hours as needed.    . triamterene-hydrochlorothiazide (DYAZIDE) 37.5-25 MG capsule TAKE 1 CAPSULE BY MOUTH ONCE DAILY 90 capsule 0  . Vitamin D, Cholecalciferol, 1000 units CAPS Take 1 tablet by mouth daily. (Patient taking differently: Take 1,000 Units by mouth daily. ) 90 capsule 4   No current facility-administered medications for this visit.    OBJECTIVE:    Vitals:   01/24/20 0857  BP: (!) 173/94  Pulse: 99  Resp: 20  Temp: (!) 97.1 F (36.2 C)  SpO2: 94%     Body mass index is 42.47 kg/m.    ECOG FS:1 - Symptomatic but completely ambulatory  Filed Weights   01/24/20 0857  Weight: 267 lb 1.6 oz (121.2 kg)  GENERAL: Patient is a well appearing female in no acute distress HEENT:  Sclerae anicteric. Mask in place. Neck is supple.  NODES:  No cervical, supraclavicular, or axillary lymphadenopathy palpated.  BREAST EXAM:  Deferred. LUNGS:  Clear to auscultation bilaterally.  No wheezes or rhonchi. HEART:  Regular rate and rhythm. No murmur appreciated. ABDOMEN:  Soft, nontender.  Positive, normoactive bowel sounds. No organomegaly palpated. MSK:  No focal spinal tenderness to  palpation.  EXTREMITIES:  Right arm lymphedema noted, sleeve and glove are on SKIN:  Clear with no obvious rashes or skin changes. No nail dyscrasia. NEURO:  Nonfocal. Well oriented.  Appropriate affect.    LAB RESULTS:  CMP     Component Value Date/Time   NA 141 01/24/2020 0840   NA 140 07/08/2016 0948   K 3.5 01/24/2020 0840   K 4.1 07/08/2016 0948   CL 104 01/24/2020 0840   CO2 29 01/24/2020 0840   CO2 24 07/08/2016 0948   GLUCOSE 109 (H) 01/24/2020 0840   GLUCOSE 89 07/08/2016 0948   BUN 14 01/24/2020 0840   BUN 11.9 07/08/2016 0948   CREATININE 0.81 01/24/2020 0840   CREATININE 0.77 05/16/2019 0905   CREATININE 0.9 07/08/2016 0948   CALCIUM 9.7 01/24/2020 0840   CALCIUM 10.0 07/08/2016 0948   PROT 7.4 01/24/2020 0840   PROT 7.6 07/08/2016 0948   ALBUMIN 3.2 (L) 01/24/2020 0840   ALBUMIN 3.6 07/08/2016 0948   AST 22 01/24/2020 0840   AST 35 05/16/2019 0905   AST 22 07/08/2016 0948   ALT 21 01/24/2020 0840   ALT 40 05/16/2019 0905   ALT 27 07/08/2016 0948   ALKPHOS 183 (H) 01/24/2020 0840   ALKPHOS 115 07/08/2016 0948   BILITOT 0.3 01/24/2020 0840   BILITOT 0.7 05/16/2019 0905   BILITOT 0.35 07/08/2016 0948   GFRNONAA >60 01/24/2020 0840   GFRNONAA >60 05/16/2019 0905   GFRAA >60 01/24/2020 0840   GFRAA >60 05/16/2019 0905    INo results found for: SPEP, UPEP  Lab Results  Component Value Date   WBC 4.4 01/24/2020   NEUTROABS  3.4 01/24/2020   HGB 12.0 01/24/2020   HCT 37.9 01/24/2020   MCV 82.6 01/24/2020   PLT 302 01/24/2020      Chemistry      Component Value Date/Time   NA 141 01/24/2020 0840   NA 140 07/08/2016 0948   K 3.5 01/24/2020 0840   K 4.1 07/08/2016 0948   CL 104 01/24/2020 0840   CO2 29 01/24/2020 0840   CO2 24 07/08/2016 0948   BUN 14 01/24/2020 0840   BUN 11.9 07/08/2016 0948   CREATININE 0.81 01/24/2020 0840   CREATININE 0.77 05/16/2019 0905   CREATININE 0.9 07/08/2016 0948      Component Value Date/Time   CALCIUM 9.7  01/24/2020 0840   CALCIUM 10.0 07/08/2016 0948   ALKPHOS 183 (H) 01/24/2020 0840   ALKPHOS 115 07/08/2016 0948   AST 22 01/24/2020 0840   AST 35 05/16/2019 0905   AST 22 07/08/2016 0948   ALT 21 01/24/2020 0840   ALT 40 05/16/2019 0905   ALT 27 07/08/2016 0948   BILITOT 0.3 01/24/2020 0840   BILITOT 0.7 05/16/2019 0905   BILITOT 0.35 07/08/2016 0948       No results found for: LABCA2  No components found for: [id number]  No results for input(s): INR in the last 168 hours.  Urinalysis No results found for: COLORURINE, APPEARANCEUR, LABSPEC, PHURINE, GLUCOSEU, HGBUR, BILIRUBINUR, KETONESUR, PROTEINUR, UROBILINOGEN, NITRITE, LEUKOCYTESUR   STUDIES: NM Bone Scan Whole Body  Result Date: 01/10/2020 CLINICAL DATA:  Metastatic breast cancer with bone in liver metastatic disease. Restaging. EXAM: NUCLEAR MEDICINE WHOLE BODY BONE SCAN TECHNIQUE: Whole body anterior and posterior images were obtained approximately 3 hours after intravenous injection of radiopharmaceutical. RADIOPHARMACEUTICALS:  21.0 mCi Technetium-34m MDP IV COMPARISON:  Prior body bone scans, most recent dated 04/13/2019. Chest CT, 08/01/2019. FINDINGS: There are multiple foci of abnormal radiotracer localization consistent with metastatic disease to bone. There are multiple foci of abnormal uptake along the thoracolumbar spine, most prominently in the lower thoracic and lower lumbar spine. Multiple abnormal foci of uptake are noted in the sternum with subtle uptake noted in the bilateral ribs. There is more prominent uptake in the proximal left femur as well as the distal left femur, and in the right ischium. These foci of uptake appear more intense than on the most recent prior study, which is likely a technical difference only. The overall extent and distribution of uptake is unchanged. There are no new areas of abnormal uptake. Degenerative uptake noted involving the shoulders, sternoclavicular joints and knees is stable.  Renal uptake is symmetric. IMPRESSION: 1. Multiple foci of skeletal metastatic disease, stable compared to the previous study. No new osseous metastatic lesions. Electronically Signed   By: Amie Portland M.D.   On: 01/10/2020 15:15   MR LIVER W WO CONTRAST  Addendum Date: 01/22/2020   ADDENDUM REPORT: 01/22/2020 12:41 ADDENDUM: Original report by Dr. [name].  Addendum by Dr. Rito Ehrlich. In the setting of motion degradation on the most recent prior, request by referring clinician to compare to multiple priors, assessing for progression. Comparison made to prior MRI abdomen dated 04/13/2019, 12/07/2018, and 04/12/2018. 13 mm lesion at the junction of segment 7/8 (series 4/image 10), previously measuring 12 mm in July 2020, grossly unchanged. However, there is a new 11 mm lesion in the caudate (series 4/image 16) and a new 9 mm lesion in segment 2 (series 4/image 16) which were not present in December 2020 (albeit motion degraded) or July 2020. Additionally, there is  an 8 mm lesion in segment 5 (series 4/image 20) and a 15 mm lesion inferiorly in segment 6 (series 4/image 26) which were not present in December 2020 (albeit motion degraded) or July 2020. As such, these are considered suspicious for new metastases in both lobes. When correlating with December 2019, numerous hepatic metastases were present at that time, and therefore the current study is still considered improved relative to 2019. These results were called by telephone at the time of interpretation on 01/22/2020 at 12:35 pm to provider Gus [name], who verbally acknowledged these results. Electronically Signed   By: Julian Hy M.D.   On: 01/22/2020 12:41   Result Date: 01/22/2020 CLINICAL DATA:  Follow-up metastatic breast carcinoma. EXAM: MRI ABDOMEN WITHOUT AND WITH CONTRAST TECHNIQUE: Multiplanar multisequence MR imaging of the abdomen was performed both before and after the administration of intravenous contrast. CONTRAST:  36mL GADAVIST  GADOBUTROL 1 MMOL/ML IV SOLN COMPARISON:  04/13/2019 FINDINGS: Lower chest: No acute findings. Hepatobiliary: Best seen on DWI and T2 fat-sat sequences are multiple small T2 hyperintense lesions involving the right, left, and caudate lobes. One lesion measuring approximately 1.3 cm in the anterior right lobe on image 10/4 remains stable. The largest lesion in the inferior right lobe measures 1.5 cm on image 26/4 was not seen on previous study, although previous study head significant motion artifact. Multiple other sub-cm T2 hyperintense lesions in caudate, right and left lobes were not definitely seen on previous study. Gallbladder is unremarkable. No evidence of biliary ductal dilatation. Pancreas:  No mass or inflammatory changes. Spleen:  Within normal limits in size and appearance. Adrenals/Urinary Tract: No masses identified. Several left renal parapelvic cysts are again noted. No evidence of hydronephrosis. Stomach/Bowel: Visualized portion unremarkable. Vascular/Lymphatic: No pathologically enlarged lymph nodes identified. No abdominal aortic aneurysm. Other:  None. Musculoskeletal: Diffuse bone metastases show no significant change compared to prior study. IMPRESSION: Multiple small T2 hyperintense liver lesions are new since previous study, although prior study showed significant image degradation by motion artifact. These are consistent with small liver metastases. Diffuse bone metastases, without significant change. Electronically Signed: By: Marlaine Hind M.D. On: 01/09/2020 10:05     ELIGIBLE FOR AVAILABLE RESEARCH PROTOCOL: PALLAS, but inelegible because of Mirena IUD   ASSESSMENT: 54 y.o. [place] woman status post right breast upper inner quadrant biopsy 10/15/2015 for a clinical T2-T3 N0, stage II invasive ductal carcinoma, estrogen and progesterone receptor positive, HER-2 nonamplified, with an Mib-1 of 70%  (1) status post right lumpectomy and sentinel lymph node sampling with  oncoplastic breast reduction 11/28/2015 for a pT2 pN1, stage IIB invasive ductal carcinoma, grade 1, with negative margins (1/1 sentinel node positive)  (a) reclassified as stage IIA in the 2018 prognostic revision   (2) left reduction mammoplasty 11/28/2015 unexpectedly found a pT1c pNX invasive ductal carcinoma, grade 1, estrogen and progesterone receptor positive, HER-2 not amplified, with an MIB-1 of 3%  (3) Mammaprint from the right sided breast cancer was "low risk", predicting a 98% chance of no disease recurrence within 5 years with anti-estrogens as the only systemic therapy. It also predicts minimal to no benefit from chemotherapy  (4) status post bilateral mastectomies with bilateral sentinel lymph node sampling 12/26/2015, showing  (a) on the right, no residual carcinoma (0/3 nodes involved)  (b) on the left, focal ductal carcinoma in situ, 0.2 cm, with negative margins; (0/4 nodes involved)  (5) adjuvant radiation 02/11/16 - 03/27/16    1) Right chest wall: 50.4 Gy in 28 fractions  2) Right chest wall boost: 10 Gy in 5 fractions   (6) started on tamoxifen neoadjuvantly 10/23/2015 to allow for expected delays in definitive local treatment  (a) Mirena IUD in place  (b) tamoxifen discontinued 08/13/2017, with progression  (7) since she had 4 lymph nodes removed from each axilla but also receive radiation on the right, lab draws should preferentially be obtained from the left arm   METASTATIC DISEASE: April 2019 (8) CT scan of the abdomen and pelvis 08/04/2017, MRI of the liver and total spine MRI 08/11/2017 and CT scan of the chest and brain on 08/13/2017 showed liver and bone metastases  (a) liver biopsy 09/07/2017 confirms estrogen receptor positive, progesterone receptor negative, HER-2 negative metastatic breast cancer  (b) chest CT scan 08/13/2017 shows multiple bilateral pulmonary nodules.  (c) CT of the brain with and without contrast 08/26/2017 shows no evidence  of metastatic disease to the brain  (d) CA 27-29 is informative  (9) fulvestrant started 08/17/2017  (a) palbociclib started 08/31/2017  (b) fulvestrant and palbociclib discontinued 01/04/2018 with evidence of progression in the liver  (10) denosumab/Xgeva started 09/14/2017  (a) held after 03/01/2018 dose because concern re. osteonecrosis  (b) resumed 06/09/2018  (c) 02/21/2019 dose held due to oral surgery, resumed 03/21/2019  (11) anastrozole started 01/04/2018  (a) everolimus started 01/29/2018  (b) liver MRI and bone scan December 2019 show stable disease  (c) chest CT scan on 08/02/2018 shows lung lesions resolved, bone lesions stable  (d) bone scan 09/13/2018 essentially stable  (e) liver MRI 12/08/2018 shows improvement in hepatic metastsases  (f) liver MRI and bone scan 04/13/2019 showed continuing improvement in the liver, stable bone lesions  (g) chest CT 08/01/2019 stable, no evidence of active disease ows  (12) 01/28/18 prophylactic left femur intramedullary nail surgery for impending left femur pathologic fracture  (a) palliative radiation with Dr. [name] to the left femur and left ilium from 02/16/2018 to 03/01/2018  (13) [facility] report on liver biopsy from April 2019 shows a negative PDL 1, stable MSI and proficient mismatch repair status.  The tumor is positive for the androgen receptor, and for PTEN for PIK3 CA mutations.  HER-2/neu was read as equivocal (it was negative by FISH on the pathology report).  (14) Progression noted in liver MRI on 01/11/2020, Fulvestrant and Aplelesib to start   PLAN: Erza and I reviewed her MRI of the liver that shows progression in her liver metastases.  She understands her treatment change to Fulvestrant and Alepelisib.  She and I reviewed this in detail.  She will return next week to start the fulvestrant.  We discussed the alpelisib and reviewed common adverse effects such as hyperglycemia, diarrhea and rash.  Since Vianey has h/o  prediabetes, I sent in a blood glucose monitoring kit so that she can check her blood sugar daily to monitor closely.    Debbie [name] met with Dr. [name] as well and understands the above plan.  We will see her back on 10/5 for lab, f/u and her next injection  She knows to call for any questions that may arise between now and her next appointment.  We are happy to see her sooner if needed.   Total encounter time 30 minutes.Wilber Bihari, NP 01/24/20 9:28 AM Medical Oncology and Hematology [facility] [place], [place] [address] Tel. [phone]    Fax. [phone]   *Total Encounter Time as defined by the Centers for Medicare and Medicaid Services includes, in addition  to the face-to-face time of a patient visit (documented in the note above) non-face-to-face time: obtaining and reviewing outside history, ordering and reviewing medications, tests or procedures, care coordination (communications with other health care professionals or caregivers) and documentation in the medical record.

## 2020-01-24 NOTE — Patient Instructions (Signed)
Denosumab injection What is this medicine? DENOSUMAB (den oh sue mab) slows bone breakdown. Prolia is used to treat osteoporosis in women after menopause and in men, and in people who are taking corticosteroids for 6 months or more. Xgeva is used to treat a high calcium level due to cancer and to prevent bone fractures and other bone problems caused by multiple myeloma or cancer bone metastases. Xgeva is also used to treat giant cell tumor of the bone. This medicine may be used for other purposes; ask your health care provider or pharmacist if you have questions. COMMON BRAND NAME(S): Prolia, XGEVA What should I tell my health care provider before I take this medicine? They need to know if you have any of these conditions:  dental disease  having surgery or tooth extraction  infection  kidney disease  low levels of calcium or Vitamin D in the blood  malnutrition  on hemodialysis  skin conditions or sensitivity  thyroid or parathyroid disease  an unusual reaction to denosumab, other medicines, foods, dyes, or preservatives  pregnant or trying to get pregnant  breast-feeding How should I use this medicine? This medicine is for injection under the skin. It is given by a health care professional in a hospital or clinic setting. A special MedGuide will be given to you before each treatment. Be sure to read this information carefully each time. For Prolia, talk to your pediatrician regarding the use of this medicine in children. Special care may be needed. For Xgeva, talk to your pediatrician regarding the use of this medicine in children. While this drug may be prescribed for children as young as 13 years for selected conditions, precautions do apply. Overdosage: If you think you have taken too much of this medicine contact a poison control center or emergency room at once. NOTE: This medicine is only for you. Do not share this medicine with others. What if I miss a dose? It is  important not to miss your dose. Call your doctor or health care professional if you are unable to keep an appointment. What may interact with this medicine? Do not take this medicine with any of the following medications:  other medicines containing denosumab This medicine may also interact with the following medications:  medicines that lower your chance of fighting infection  steroid medicines like prednisone or cortisone This list may not describe all possible interactions. Give your health care provider a list of all the medicines, herbs, non-prescription drugs, or dietary supplements you use. Also tell them if you smoke, drink alcohol, or use illegal drugs. Some items may interact with your medicine. What should I watch for while using this medicine? Visit your doctor or health care professional for regular checks on your progress. Your doctor or health care professional may order blood tests and other tests to see how you are doing. Call your doctor or health care professional for advice if you get a fever, chills or sore throat, or other symptoms of a cold or flu. Do not treat yourself. This drug may decrease your body's ability to fight infection. Try to avoid being around people who are sick. You should make sure you get enough calcium and vitamin D while you are taking this medicine, unless your doctor tells you not to. Discuss the foods you eat and the vitamins you take with your health care professional. See your dentist regularly. Brush and floss your teeth as directed. Before you have any dental work done, tell your dentist you are   receiving this medicine. Do not become pregnant while taking this medicine or for 5 months after stopping it. Talk with your doctor or health care professional about your birth control options while taking this medicine. Women should inform their doctor if they wish to become pregnant or think they might be pregnant. There is a potential for serious side  effects to an unborn child. Talk to your health care professional or pharmacist for more information. What side effects may I notice from receiving this medicine? Side effects that you should report to your doctor or health care professional as soon as possible:  allergic reactions like skin rash, itching or hives, swelling of the face, lips, or tongue  bone pain  breathing problems  dizziness  jaw pain, especially after dental work  redness, blistering, peeling of the skin  signs and symptoms of infection like fever or chills; cough; sore throat; pain or trouble passing urine  signs of low calcium like fast heartbeat, muscle cramps or muscle pain; pain, tingling, numbness in the hands or feet; seizures  unusual bleeding or bruising  unusually weak or tired Side effects that usually do not require medical attention (report to your doctor or health care professional if they continue or are bothersome):  constipation  diarrhea  headache  joint pain  loss of appetite  muscle pain  runny nose  tiredness  upset stomach This list may not describe all possible side effects. Call your doctor for medical advice about side effects. You may report side effects to FDA at 1-800-FDA-1088. Where should I keep my medicine? This medicine is only given in a clinic, doctor's office, or other health care setting and will not be stored at home. NOTE: This sheet is a summary. It may not cover all possible information. If you have questions about this medicine, talk to your doctor, pharmacist, or health care provider.  2020 [employer] (2017-09-03 16:10:44)

## 2020-01-24 NOTE — Telephone Encounter (Signed)
Per md review : proceed with xgeva therapy despite blood pressure 187/82- pt did not take am BP meds and will take promptly upon returning home.

## 2020-01-25 ENCOUNTER — Ambulatory Visit: Payer: 59

## 2020-01-25 ENCOUNTER — Other Ambulatory Visit: Payer: 59

## 2020-01-25 ENCOUNTER — Other Ambulatory Visit: Payer: Self-pay | Admitting: Oncology

## 2020-01-25 ENCOUNTER — Ambulatory Visit: Payer: 59 | Admitting: Adult Health

## 2020-01-25 LAB — CANCER ANTIGEN 27.29: CA 27.29: 44 U/mL — ABNORMAL HIGH (ref 0.0–38.6)

## 2020-01-25 NOTE — Telephone Encounter (Signed)
Oral Oncology Pharmacist Encounter   Notified by [employer] that Prior Authorization for [place] (alpelisib) has been denied.    Appeal letter with supporting documentation faxed to [employer] and Grievance Department (Fax # [phone])   [facility] will continue to follow.   Leron Croak, PharmD, BCPS Hematology/Oncology Clinical Pharmacist [facility] [phone] 01/25/2020 8:11 AM

## 2020-01-25 NOTE — Progress Notes (Signed)
NB: Carletha had her IUD removed in May 2019.  She has not had a period since.  She is clearly postmenopausal

## 2020-01-25 NOTE — Progress Notes (Signed)
Ordered baseline A1c for patient prior to starting [place] per Dr. [name]. Per patient she was fasting for the CMP drawn 9/15 therefore that glucose of 109 will serve as her baseline fasting glucose.   Eddie Candle, PharmD PGY-2 Hematology/Oncology Pharmacy Resident

## 2020-01-29 NOTE — Telephone Encounter (Signed)
Oral Oncology Pharmacist Encounter  Notified by [employer] that appeal forPiqray (alpelisib)has been approved.   Oral Oncology Clinic will continue to follow.  Leron Croak, PharmD, BCPS Hematology/Oncology Clinical Pharmacist [facility] [phone] 01/25/2020 8:11 AM

## 2020-01-29 NOTE — Telephone Encounter (Signed)
Oral Oncology Patient Advocate Encounter  Prior Authorization for Joylene Draft has been approved.    PA# [id number] Effective dates: 01/27/20 through 01/25/21  Patients co-pay is $0  Oral Oncology Clinic will continue to follow.   [place] [facility] [place] Phone [phone] Fax [phone] 01/29/2020 8:24 AM

## 2020-01-30 ENCOUNTER — Inpatient Hospital Stay: Payer: 59

## 2020-01-30 ENCOUNTER — Other Ambulatory Visit: Payer: Self-pay

## 2020-01-30 ENCOUNTER — Telehealth: Payer: Self-pay | Admitting: Pharmacist

## 2020-01-30 VITALS — BP 177/87 | HR 104 | Resp 20

## 2020-01-30 DIAGNOSIS — C50211 Malignant neoplasm of upper-inner quadrant of right female breast: Secondary | ICD-10-CM

## 2020-01-30 DIAGNOSIS — Z9013 Acquired absence of bilateral breasts and nipples: Secondary | ICD-10-CM | POA: Diagnosis not present

## 2020-01-30 DIAGNOSIS — C787 Secondary malignant neoplasm of liver and intrahepatic bile duct: Secondary | ICD-10-CM | POA: Diagnosis not present

## 2020-01-30 DIAGNOSIS — Z170 Estrogen receptor positive status [ER+]: Secondary | ICD-10-CM

## 2020-01-30 DIAGNOSIS — Z79899 Other long term (current) drug therapy: Secondary | ICD-10-CM | POA: Diagnosis not present

## 2020-01-30 DIAGNOSIS — C50011 Malignant neoplasm of nipple and areola, right female breast: Secondary | ICD-10-CM

## 2020-01-30 DIAGNOSIS — C50012 Malignant neoplasm of nipple and areola, left female breast: Secondary | ICD-10-CM

## 2020-01-30 DIAGNOSIS — C7951 Secondary malignant neoplasm of bone: Secondary | ICD-10-CM | POA: Diagnosis not present

## 2020-01-30 DIAGNOSIS — C50112 Malignant neoplasm of central portion of left female breast: Secondary | ICD-10-CM

## 2020-01-30 LAB — COMPREHENSIVE METABOLIC PANEL
ALT: 24 U/L (ref 0–44)
AST: 21 U/L (ref 15–41)
Albumin: 3.1 g/dL — ABNORMAL LOW (ref 3.5–5.0)
Alkaline Phosphatase: 165 U/L — ABNORMAL HIGH (ref 38–126)
Anion gap: 10 (ref 5–15)
BUN: 8 mg/dL (ref 6–20)
CO2: 27 mmol/L (ref 22–32)
Calcium: 9.1 mg/dL (ref 8.9–10.3)
Chloride: 105 mmol/L (ref 98–111)
Creatinine, Ser: 0.76 mg/dL (ref 0.44–1.00)
GFR calc Af Amer: >60 mL/min (ref >60)
GFR calc non Af Amer: >60 mL/min (ref >60)
Glucose, Bld: 103 mg/dL — ABNORMAL HIGH (ref 70–99)
Potassium: 3.2 mmol/L — ABNORMAL LOW (ref 3.5–5.1)
Sodium: 142 mmol/L (ref 135–145)
Total Bilirubin: 0.3 mg/dL (ref 0.3–1.2)
Total Protein: 6.9 g/dL (ref 6.5–8.1)

## 2020-01-30 LAB — CBC WITH DIFFERENTIAL/PLATELET
Abs Immature Granulocytes: 0.04 10*3/uL (ref 0.00–0.07)
Basophils Absolute: 0 10*3/uL (ref 0.0–0.1)
Basophils Relative: 1 %
Eosinophils Absolute: 0.2 10*3/uL (ref 0.0–0.5)
Eosinophils Relative: 3 %
HCT: 37.2 % (ref 36.0–46.0)
Hemoglobin: 11.9 g/dL — ABNORMAL LOW (ref 12.0–15.0)
Immature Granulocytes: 1 %
Lymphocytes Relative: 10 %
Lymphs Abs: 0.5 10*3/uL — ABNORMAL LOW (ref 0.7–4.0)
MCH: 26.2 pg (ref 26.0–34.0)
MCHC: 32 g/dL (ref 30.0–36.0)
MCV: 81.9 fL (ref 80.0–100.0)
Monocytes Absolute: 0.5 10*3/uL (ref 0.1–1.0)
Monocytes Relative: 9 %
Neutro Abs: 4.1 10*3/uL (ref 1.7–7.7)
Neutrophils Relative %: 76 %
Platelets: 296 10*3/uL (ref 150–400)
RBC: 4.54 MIL/uL (ref 3.87–5.11)
RDW: 17 % — ABNORMAL HIGH (ref 11.5–15.5)
WBC: 5.2 10*3/uL (ref 4.0–10.5)
nRBC: 0 % (ref 0.0–0.2)

## 2020-01-30 LAB — HEMOGLOBIN A1C
Hgb A1c MFr Bld: 6.4 % — ABNORMAL HIGH (ref 4.8–5.6)
Mean Plasma Glucose: 136.98 mg/dL

## 2020-01-30 MED ORDER — FULVESTRANT 250 MG/5ML IM SOLN
INTRAMUSCULAR | Status: AC
  Filled 2020-01-30: qty 10

## 2020-01-30 MED ORDER — FULVESTRANT 250 MG/5ML IM SOLN
500.0000 mg | Freq: Once | INTRAMUSCULAR | Status: AC
  Administered 2020-01-30: 500 mg via INTRAMUSCULAR

## 2020-01-30 MED ORDER — PIQRAY (300 MG DAILY DOSE) 2 X 150 MG PO TBPK: ORAL_TABLET | ORAL | 6 refills | Status: DC

## 2020-01-30 MED FILL — PIQRAY 300MG DAILY DOSE 2X1: 2 X 150 | 28 days supply | Qty: 56 | Fill #0

## 2020-01-30 NOTE — Patient Instructions (Signed)
Fulvestrant injection What is this medicine? FULVESTRANT (ful VES trant) blocks the effects of estrogen. It is used to treat breast cancer. This medicine may be used for other purposes; ask your health care provider or pharmacist if you have questions. COMMON BRAND NAME(S): FASLODEX What should I tell my health care provider before I take this medicine? They need to know if you have any of these conditions:  bleeding disorders  liver disease  low blood counts, like low white cell, platelet, or red cell counts  an unusual or allergic reaction to fulvestrant, other medicines, foods, dyes, or preservatives  pregnant or trying to get pregnant  breast-feeding How should I use this medicine? This medicine is for injection into a muscle. It is usually given by a health care professional in a hospital or clinic setting. Talk to your pediatrician regarding the use of this medicine in children. Special care may be needed. Overdosage: If you think you have taken too much of this medicine contact a poison control center or emergency room at once. NOTE: This medicine is only for you. Do not share this medicine with others. What if I miss a dose? It is important not to miss your dose. Call your doctor or health care professional if you are unable to keep an appointment. What may interact with this medicine?  medicines that treat or prevent blood clots like warfarin, enoxaparin, dalteparin, apixaban, dabigatran, and rivaroxaban This list may not describe all possible interactions. Give your health care provider a list of all the medicines, herbs, non-prescription drugs, or dietary supplements you use. Also tell them if you smoke, drink alcohol, or use illegal drugs. Some items may interact with your medicine. What should I watch for while using this medicine? Your condition will be monitored carefully while you are receiving this medicine. You will need important blood work done while you are taking  this medicine. Do not become pregnant while taking this medicine or for at least 1 year after stopping it. Women of child-bearing potential will need to have a negative pregnancy test before starting this medicine. Women should inform their doctor if they wish to become pregnant or think they might be pregnant. There is a potential for serious side effects to an unborn child. Men should inform their doctors if they wish to father a child. This medicine may lower sperm counts. Talk to your health care professional or pharmacist for more information. Do not breast-feed an infant while taking this medicine or for 1 year after the last dose. What side effects may I notice from receiving this medicine? Side effects that you should report to your doctor or health care professional as soon as possible:  allergic reactions like skin rash, itching or hives, swelling of the face, lips, or tongue  feeling faint or lightheaded, falls  pain, tingling, numbness, or weakness in the legs  signs and symptoms of infection like fever or chills; cough; flu-like symptoms; sore throat  vaginal bleeding Side effects that usually do not require medical attention (report to your doctor or health care professional if they continue or are bothersome):  aches, pains  constipation  diarrhea  headache  hot flashes  nausea, vomiting  pain at site where injected  stomach pain This list may not describe all possible side effects. Call your doctor for medical advice about side effects. You may report side effects to FDA at 1-800-FDA-1088. Where should I keep my medicine? This drug is given in a hospital or clinic and will   not be stored at home. NOTE: This sheet is a summary. It may not cover all possible information. If you have questions about this medicine, talk to your doctor, pharmacist, or health care provider.  2020 Elsevier/Gold Standard (2017-08-05 11:34:41)  

## 2020-01-30 NOTE — Telephone Encounter (Signed)
Oral Oncology Pharmacist Encounter  Received new prescription for Piqray for the treatment of ER+, HER2- metastatic breast cancer in conjunction with fulvestrant, planned duration until disease progression or unacceptable drug toxicity.  Labs from 9/21 assessed, all wnl.  Current medication list in Epic reviewed, no DDIs with alpelisib identified.  Evaluated chart and no patient barriers to medication adherence noted.   Prescription has been e-scribed to the [facility] for benefits analysis and approval.  Oral Oncology Clinic will continue to follow for insurance authorization, copayment issues, initial counseling and start date.  Eddie Candle, PharmD PGY-2 Hematology/Oncology Pharmacy Resident   01/30/2020 3:07 PM Oral Oncology Clinic [phone]

## 2020-01-30 NOTE — Telephone Encounter (Signed)
Oral Chemotherapy Pharmacist Encounter  Patient Education I spoke with patient for overview of new oral chemotherapy medication: Piqray (apelisib) for the treatment of ER+, HER2- metastatic breast cancer, planned duration until disease progression or unacceptable drug toxicity.   Pt is doing well. Counseled patient on administration, dosing, side effects, monitoring, drug-food interactions, safe handling, storage, and disposal.  Patient will take 300 mg daily with food.  Side effects include but not limited to: hyperglycemia, diarrhea, increased LFTs and creatinine, itching/rash, fatigue, myelosuppresion.    Reviewed with patient importance of keeping a medication schedule and plan for any missed doses.  Debbie [name] voiced understanding and appreciation. All questions answered.  Provided patient with [facility] phone number. Patient knows to call the office with questions or concerns. [facility] will continue to follow.  Andrea Lippucci, PharmD  PGY2 Hematology/Oncology Pharmacy Resident [facility] 01/30/2020 3:49 PM   

## 2020-01-31 MED ORDER — METFORMIN HCL ER 500 MG PO TB24: 500.0000 mg | ORAL_TABLET | Freq: Every day | ORAL | 2 refills | Status: DC

## 2020-01-31 NOTE — Telephone Encounter (Signed)
Sent prescription for metformin 500 mg ER to [facility] outpatient pharmacy per Dr. [name] due to patient's elevated baseline A1c of 6.4 and the risk of elevated glucose with Piqray administration. Patient was counseled to take metformin with food and to separate from [facility]. Patient aware of new prescription and will start tomorrow morning.

## 2020-01-31 NOTE — Addendum Note (Signed)
Addended by: Eddie Candle C on: 01/31/2020 04:29 PM   Modules accepted: Orders

## 2020-02-01 MED FILL — METFORMIN HCL ER 500 MG TB2: 500 | 30 days supply | Qty: 30 | Fill #0

## 2020-02-06 ENCOUNTER — Other Ambulatory Visit: Payer: 59

## 2020-02-06 ENCOUNTER — Ambulatory Visit: Payer: 59

## 2020-02-06 MED FILL — LOSARTAN-HCTZ 100-25 MG TAB: 100-25 | 90 days supply | Qty: 90 | Fill #2

## 2020-02-07 ENCOUNTER — Telehealth: Payer: Self-pay | Admitting: *Deleted

## 2020-02-07 MED FILL — FLUARIX QUADRIVALENT 0.5 ML: 0.5 | 1 days supply | Qty: 1 | Fill #0

## 2020-02-07 NOTE — Telephone Encounter (Signed)
This RN spoke with pt per her call stating her blood sugars have been elevated over the past week - from 222 to 484 today.  She started Piqray on 9/21. She started metformin on 9/22 due to starting Hg A1c of 6.4.  She verified above were fasting readings in the am.  Per MD review and per protocol for Piqray - informed pt to hold Piqray at this time but continue metformin.  She will continue to check sugars and call with readings on Friday.

## 2020-02-08 ENCOUNTER — Telehealth: Payer: Self-pay | Admitting: *Deleted

## 2020-02-08 NOTE — Telephone Encounter (Signed)
This RN spoke with pt this AM- she states her blood sugar was 334 pm 9/29 and this AM - fasting - was 468.  Per review with MD- recommendation give to pt to increase her dose to 2 tabs (1000 mg ) daily and continue to monitor and call daily with readings.  Debbie [name] verbalized understanding.  Joylene Draft is currently on hold.

## 2020-02-09 ENCOUNTER — Other Ambulatory Visit: Payer: Self-pay | Admitting: *Deleted

## 2020-02-09 ENCOUNTER — Telehealth: Payer: Self-pay | Admitting: *Deleted

## 2020-02-09 MED ORDER — FLUCONAZOLE 100 MG PO TABS: ORAL_TABLET | ORAL | 0 refills | Status: DC

## 2020-02-09 MED FILL — FLUCONAZOLE 100 MG TABLET: 100 | 7 days supply | Qty: 8 | Fill #0

## 2020-02-09 NOTE — Telephone Encounter (Signed)
Pt called with blood sugar readings -  PM 9/30 = 138  AM 10/1= 168 / Fasting  Metformin XR at 1000 mg daily  Of note pt has developed genital yeast with minimal benefit from OTC Monostat.  Prescription for diflucan obtained and sent to verified phx.  Pt will continue on current regimen of metformin, and continue to hold the [place].  She understands to call the office if needed over the weekend.  Pt will call Monday with reading update for visit on 10/5.  No other needs at this time.

## 2020-02-12 ENCOUNTER — Telehealth: Payer: Self-pay | Admitting: *Deleted

## 2020-02-12 NOTE — Telephone Encounter (Signed)
Pt left VM with readings of blood sugar results   AM/fasting 10/1 -168, 10/2-121, 10/3-128 10/4- 125  PM@730              123          171          105   Pt is on 1000 mg metformin XR presently and Piqray is on hold.  Pt is scheduled for follow up tomorrow with MD.

## 2020-02-12 NOTE — Progress Notes (Signed)
Debbie [name]  [phone] [phone]    ID: KIANDRA SANGUINETTI DOB: 12-12-1965  MR#: [id number]  YQM#:[id number]  Patient Care Team: Kelton Pillar, MD as PCP - General (Family Medicine) Jacelyn Pi, MD as Consulting Physician (Endocrinology) Jovita Kussmaul, MD as Consulting Physician (General Surgery) Leara Rawl, Virgie Dad, MD as Consulting Physician (Oncology) [name], MD as Attending Physician (Radiation Oncology) Belva Crome, MD as Consulting Physician (Cardiology) Crissie Reese, MD as Consulting Physician (Plastic Surgery) [name], [name], NP as Nurse Practitioner (Hematology and Oncology) Margarette Canada (Inactive) Lajoyce Lauber, DMD (Dentistry) OTHER MD:   CHIEF COMPLAINT: Estrogen receptor positive stage IV  breast cancer (s/p bilateral mastectomies)  CURRENT TREATMENT: Fulvestrant; Alepelisib; denosumab/Xgeva   INTERVAL HISTORY: Angela returns today for follow-up and treatment of her estrogen receptor positive stage IV breast cancer.  She began Fulvestrant on 01/30/2020. She will receive her second dose today and then a third dose 02/27/2020 after which the doses will be every 4 weeks.  She continues on Xgeva every 12 weeks, with her most recent dose on 01/24/2020.    She was started on alpelisib at 300 mg daily on 01/30/2020.  She was started on Metformin 500 mg daily at the same time.  Unfortunately this brought her blood sugar up to about 400.  She stopped the [facility] after about 8 days, increased the Metformin to a gram daily, and her blood sugars have drifted down to normal, currently 150 8 at night 120 3 in the morning yesterday and 99 this morning.  REVIEW OF SYSTEMS: Kemyah tells me where her blood sugars were greater than 400 she urinated a lot was dry all the time very thirsty blurred vision and could not concentrate.  She is now feeling more like herself and she is back to work 60 hours a week.  She does  have a metal taste in her mouth.  With the Metformin she has a loose but not diarrhea bowel movement every morning and that is it.  She also had a yeast infection which was treated with Diflucan successfully.  Just a little bit of rosacea.  Detailed review of systems today was otherwise stable.   BREAST CANCER HISTORY: From the original intake note:  Labella had screening mammography with tomosynthesis at the [facility] 10/02/2015. This showed 2 possible masses in the right breast as well as some suspicious calcifications. The left breast was unremarkable. On 10/15/2015 she underwent right diagnostic mammography with tomography and right breast ultrasonography. The breast density was category B. Spot magnification views confirmed an area of pleomorphic calcifications measuring up to 5 cm in the upper half of the breast. Also in the upper outer right breast there were adjacent low-density masses without distortion or calcifications. On physical exam there was no palpable abnormality.  Targeted ultrasound of the right breast found the 2 "masses" in the right upper quadrant were benign cysts measuring 0.8 and 0.5 cm respectively. Biopsy of the area of calcification on 10/15/2015 showed ([id number]) invasive ductal carcinoma, E-cadherin positive, grade 2, estrogen receptor 90% positive, progesterone receptor 50% positive, both with strong staining intensity, with an MIB-1 of 70%, and no HER-2 amplification, the signals ratio being 1.18 and the number per cell 2.60.  Her subsequent history is as detailed below    PAST MEDICAL HISTORY: Past Medical History:  Diagnosis Date  . Anxiety   . Arthritis   . Bone metastasis ([place])   . Breast cancer of upper-inner quadrant of  right female breast [facility]) oncologist-  dr [name]--- 04/ 2019 ER/PR+ Stage IV w/ metastatic disease to liver, total spine, lung, bone   dx 06/ 2017  right breast invasive ductal carcinoma, Stage IIB, ER/PR + (pT2 pN1), grade 1--  s/p  right breast lumpecotmy w/ sln bx's and bilateral breast reduction 11-28-2015/  left breast with reduction , dx invasive ductal ca, Grade 1 (pT1cpNX) ER/PR positive/  adjuvant radiation completed 03-27-2016 to right chest wall, 2018  reclassifiec Stage IIA  . Cancer, metastatic to liver ([place])   . Chronic back pain   . Constipation   . Depression   . Family history of adverse reaction to anesthesia     mother has Copd was kept on vent   . Headache   . Heart murmur   . History of hyperthyroidism    s/p  RAI  . History of radiation therapy 02/11/16- 03/27/16   Right Chest Wall 50.4 Gy in 28 fractions, Right Chest Wall Boost 10 Gy in 5 fractions.   . Hyperlipidemia   . Hypertension   . Hypothyroidism, postradioiodine therapy    endocrinoloigst-  dr balan--  2004  s/p  RAI  . Menorrhagia 2011  . Multiple pulmonary nodules    secondary  metastatic  . PONV (postoperative nausea and vomiting)    patch helps   . SUI (stress urinary incontinence, female)   . Wears contact lenses   . Wears dentures    full upper and lower partial    PAST SURGICAL HISTORY: Past Surgical History:  Procedure Laterality Date  . BREAST LUMPECTOMY WITH NEEDLE LOCALIZATION AND AXILLARY SENTINEL LYMPH NODE BX Right 11/28/2015   Procedure: RIGHT BREAST LUMPECTOMY WITH NEEDLE LOCALIZATION AND AXILLARY SENTINEL LYMPH NODE BX;  Surgeon: Autumn Messing III, MD;  Location: [place];  Service: General;  Laterality: Right;  . BREAST REDUCTION SURGERY Bilateral 11/28/2015   Procedure: MAMMARY REDUCTION  (BREAST) BILATERAL;  Surgeon: Crissie Reese, MD;  Location: [place];  Service: Plastics;  Laterality: Bilateral;  . CESAREAN SECTION  01/12/1993  . COLONOSCOPY    . Dental surgeries    . FEMUR IM NAIL Left 01/28/2018   Procedure: INTRAMEDULLARY (IM) NAIL FEMORAL;  Surgeon: Nicholes Stairs, MD;  Location: [place];  Service: Orthopedics;  Laterality: Left;  . HYSTEROSCOPY N/A 09/02/2017   Procedure: HYSTEROSCOPY  to remove IUD;   Surgeon: Eldred Manges, MD;  Location: [facility];  Service: Gynecology;  Laterality: N/A;  . Liver biospy    . MASTECTOMY    . MASTECTOMY W/ SENTINEL NODE BIOPSY Bilateral 12/26/2015   Procedure: BILATERAL MASTECTOMY WITH LEFT SENTINEL LYMPH NODE BIOPSY;  Surgeon: Autumn Messing III, MD;  Location: [place];  Service: General;  Laterality: Bilateral;  . Removal of IUD    . TUBAL LIGATION Bilateral 1995    FAMILY HISTORY: Family History  Problem Relation Age of Onset  . Diabetes Mother   . Hypertension Mother   . Hypertension Father   . Diabetes Maternal Grandmother   . Hypertension Sister   . Cancer Paternal Grandmother        colon  . Colon cancer Paternal Grandmother   . Esophageal cancer Neg Hx   . Stomach cancer Neg Hx   . Rectal cancer Neg Hx    The patient's parents are still living, in their early 65s as of June 2017. The patient has one brother, 2 sisters. On the maternal side there is a history of uterine and prostate cancer. On  the paternal side there is a history of colon cancer and possibly uterine cancer. There is no history of breast or ovarian cancer in the family.    GYNECOLOGIC HISTORY:  No LMP recorded. (Menstrual status: IUD).  Menarche age 75. She still having regular periods. She is GX P1, first pregnancy age 47. She used oral contraceptives for a few years remotely, with no complications. She is currently on a Mirena IUD and also is status post bilateral tubal ligation.   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 Her husband [name] is a Administrator.  They are both taking appropriate precautions at work.  Their son [name], age 74, is a radio DJ.  He no longer lives with the patient.  The patient has no grandchildren. She is not a Ambulance person.   ADVANCED DIRECTIVES: In the absence of any documents to the contrary her husband is her healthcare power of attorney   HEALTH MAINTENANCE: Social  History   Tobacco Use  . Smoking status: Never Smoker  . Smokeless tobacco: Never Used  Vaping Use  . Vaping Use: Never used  Substance Use Topics  . Alcohol use: No  . Drug use: No     Colonoscopy: January 2016   PAP: January 2016   Bone density: August 2017 showed a T score of +0.7 normal  Lipid panel:  Allergies  Allergen Reactions  . Ace Inhibitors Cough  . Atorvastatin Other (See Comments)    Joint pain  . Simvastatin Other (See Comments)    Joint pain    Current Outpatient Medications  Medication Sig Dispense Refill  . alpelisib (PIQRAY, 300 MG DAILY DOSE,) 2 x 150 MG Therapy Pack Take two 160m tablets with food at the same time daily. Swallow whole, do not crush, chew, or split. 56 each 6  . blood glucose meter kit and supplies KIT Dispense based on patient and insurance preference. Use up to four times daily as directed. (FOR ICD-9 250.00, 250.01). 1 each 0  . Calcium-Magnesium 250-125 MG TABS Take 1 tablet by mouth daily. 120 each 4  . cetirizine (ZYRTEC ALLERGY) 10 MG tablet Take 10 mg by mouth daily as needed for allergies.     . chlorhexidine (PERIDEX) 0.12 % solution     . docusate sodium (COLACE) 100 MG capsule Take 200-400 mg by mouth daily.    . Ferrous Sulfate (IRON) 325 (65 Fe) MG TABS Take 1 tablet (325 mg total) by mouth daily. 30 each 2  . fluconazole (DIFLUCAN) 100 MG tablet 2 tablets first dose then 1 tab daily 8 tablet 0  . furosemide (LASIX) 20 MG tablet Take 20 mg by mouth daily as needed.    . hydrocortisone cream 1 % Apply 1 application topically daily as needed (for rash).    .Marland Kitchenibuprofen (ADVIL) 800 MG tablet Take 800 mg by mouth every 6 (six) hours as needed.    .Marland Kitchenibuprofen (ADVIL,MOTRIN) 600 MG tablet Take 1 tablet (600 mg total) by mouth every 6 (six) hours as needed for moderate pain. 60 tablet 0  . Lancets (ONETOUCH ULTRASOFT) lancets Use as instructed 100 each 12  . levothyroxine (SYNTHROID, LEVOTHROID) 137 MCG tablet Take 137 mcg by mouth  daily before breakfast.     . LORazepam (ATIVAN) 0.5 MG tablet Take 1 tablet (0.5 mg total) by mouth at bedtime as needed for anxiety. 30 tablet 0  . losartan (COZAAR) 100 MG tablet Take 100 mg by mouth  every morning.     Marland Kitchen losartan-hydrochlorothiazide (HYZAAR) 100-25 MG tablet Take 1 tablet by mouth daily.    . metFORMIN (GLUCOPHAGE XR) 500 MG 24 hr tablet Take 1 tablet (500 mg total) by mouth daily with breakfast. 30 tablet 2  . Multiple Vitamin (MULTIVITAMIN) tablet Take 1 tablet by mouth daily.    Marland Kitchen omega-3 acid ethyl esters (LOVAZA) 1 g capsule Take 1 g by mouth 2 (two) times daily.     . potassium chloride SA (K-DUR,KLOR-CON) 20 MEQ tablet Take 40 mEq by mouth daily.     . pregabalin (LYRICA) 100 MG capsule TAKE 1 CAPSULE BY MOUTH THREE TIMES DAILY 90 capsule 0  . prochlorperazine (COMPAZINE) 10 MG tablet Take 1 tablet (10 mg total) by mouth every 6 (six) hours as needed for nausea or vomiting. 30 tablet 0  . rosuvastatin (CRESTOR) 5 MG tablet Take 1 tablet (5 mg total) by mouth daily.    . sertraline (ZOLOFT) 100 MG tablet Take 1 tablet (100 mg total) by mouth daily.    . traMADol (ULTRAM) 50 MG tablet Take 50 mg by mouth every 6 (six) hours as needed.    . triamterene-hydrochlorothiazide (DYAZIDE) 37.5-25 MG capsule TAKE 1 CAPSULE BY MOUTH ONCE DAILY 90 capsule 0  . Vitamin D, Cholecalciferol, 1000 units CAPS Take 1 tablet by mouth daily. (Patient taking differently: Take 1,000 Units by mouth daily. ) 90 capsule 4   No current facility-administered medications for this visit.    OBJECTIVE: African-American woman in no acute distress  Vitals:   02/13/20 0839  BP: (!) 147/75  Pulse: 96  Resp: 18  Temp: (!) 97.5 F (36.4 C)  SpO2: 96%     Body mass index is 41.42 kg/m.    ECOG FS:1 - Symptomatic but completely ambulatory  Filed Weights   02/13/20 0839  Weight: 260 lb 8 oz (118.2 kg)     Sclerae unicteric, EOMs intact Wearing a mask No cervical or supraclavicular  adenopathy Lungs no rales or rhonchi Heart regular rate and rhythm Abd soft, nontender, positive bowel sounds MSK no focal spinal tenderness, chronic right upper extremity lymphedema, sleeve not in place Neuro: nonfocal, well oriented, appropriate affect Breasts: Status post bilateral mastectomies with no evidence of chest wall recurrence   LAB RESULTS:  CMP     Component Value Date/Time   NA 142 01/30/2020 0814   NA 140 07/08/2016 0948   K 3.2 (L) 01/30/2020 0814   K 4.1 07/08/2016 0948   CL 105 01/30/2020 0814   CO2 27 01/30/2020 0814   CO2 24 07/08/2016 0948   GLUCOSE 103 (H) 01/30/2020 0814   GLUCOSE 89 07/08/2016 0948   BUN 8 01/30/2020 0814   BUN 11.9 07/08/2016 0948   CREATININE 0.76 01/30/2020 0814   CREATININE 0.77 05/16/2019 0905   CREATININE 0.9 07/08/2016 0948   CALCIUM 9.1 01/30/2020 0814   CALCIUM 10.0 07/08/2016 0948   PROT 6.9 01/30/2020 0814   PROT 7.6 07/08/2016 0948   ALBUMIN 3.1 (L) 01/30/2020 0814   ALBUMIN 3.6 07/08/2016 0948   AST 21 01/30/2020 0814   AST 35 05/16/2019 0905   AST 22 07/08/2016 0948   ALT 24 01/30/2020 0814   ALT 40 05/16/2019 0905   ALT 27 07/08/2016 0948   ALKPHOS 165 (H) 01/30/2020 0814   ALKPHOS 115 07/08/2016 0948   BILITOT 0.3 01/30/2020 0814   BILITOT 0.7 05/16/2019 0905   BILITOT 0.35 07/08/2016 0948   GFRNONAA >60 01/30/2020 3570  GFRNONAA >60 05/16/2019 0905   GFRAA >60 01/30/2020 0814   GFRAA >60 05/16/2019 0905    INo results found for: SPEP, UPEP  Lab Results  Component Value Date   WBC 8.3 02/13/2020   NEUTROABS 6.8 02/13/2020   HGB 12.2 02/13/2020   HCT 37.3 02/13/2020   MCV 81.8 02/13/2020   PLT 291 02/13/2020      Chemistry      Component Value Date/Time   NA 142 01/30/2020 0814   NA 140 07/08/2016 0948   K 3.2 (L) 01/30/2020 0814   K 4.1 07/08/2016 0948   CL 105 01/30/2020 0814   CO2 27 01/30/2020 0814   CO2 24 07/08/2016 0948   BUN 8 01/30/2020 0814   BUN 11.9 07/08/2016 0948    CREATININE 0.76 01/30/2020 0814   CREATININE 0.77 05/16/2019 0905   CREATININE 0.9 07/08/2016 0948      Component Value Date/Time   CALCIUM 9.1 01/30/2020 0814   CALCIUM 10.0 07/08/2016 0948   ALKPHOS 165 (H) 01/30/2020 0814   ALKPHOS 115 07/08/2016 0948   AST 21 01/30/2020 0814   AST 35 05/16/2019 0905   AST 22 07/08/2016 0948   ALT 24 01/30/2020 0814   ALT 40 05/16/2019 0905   ALT 27 07/08/2016 0948   BILITOT 0.3 01/30/2020 0814   BILITOT 0.7 05/16/2019 0905   BILITOT 0.35 07/08/2016 0948       No results found for: LABCA2  No components found for: LEXNT700  No results for input(s): INR in the last 168 hours.  Urinalysis No results found for: COLORURINE, APPEARANCEUR, LABSPEC, PHURINE, GLUCOSEU, HGBUR, BILIRUBINUR, KETONESUR, PROTEINUR, UROBILINOGEN, NITRITE, LEUKOCYTESUR   STUDIES: No results found.   ELIGIBLE FOR AVAILABLE RESEARCH PROTOCOL: PALLAS, but inelegible because of Mirena IUD   ASSESSMENT: 54 y.o.  woman status post right breast upper inner quadrant biopsy 10/15/2015 for a clinical T2-T3 N0, stage II invasive ductal carcinoma, estrogen and progesterone receptor positive, HER-2 nonamplified, with an Mib-1 of 70%  (1) status post right lumpectomy and sentinel lymph node sampling with oncoplastic breast reduction 11/28/2015 for a pT2 pN1, stage IIB invasive ductal carcinoma, grade 1, with negative margins (1/1 sentinel node positive)  (a) reclassified as stage IIA in the 2018 prognostic revision   (2) left reduction mammoplasty 11/28/2015 unexpectedly found a pT1c pNX invasive ductal carcinoma, grade 1, estrogen and progesterone receptor positive, HER-2 not amplified, with an MIB-1 of 3%  (3) Mammaprint from the right sided breast cancer was "low risk", predicting a 98% chance of no disease recurrence within 5 years with anti-estrogens as the only systemic therapy. It also predicts minimal to no benefit from chemotherapy  (4) status post bilateral  mastectomies with bilateral sentinel lymph node sampling 12/26/2015, showing  (a) on the right, no residual carcinoma (0/3 nodes involved)  (b) on the left, focal ductal carcinoma in situ, 0.2 cm, with negative margins; (0/4 nodes involved)  (5) adjuvant radiation 02/11/16 - 03/27/16    1) Right chest wall: 50.4 Gy in 28 fractions               2) Right chest wall boost: 10 Gy in 5 fractions   (6) started on tamoxifen neoadjuvantly 10/23/2015 to allow for expected delays in definitive local treatment  (a) Mirena IUD in place  (b) tamoxifen discontinued 08/13/2017, with progression  (7) since she had 4 lymph nodes removed from each axilla but also receive radiation on the right, lab draws should preferentially be obtained from the left  arm   METASTATIC DISEASE: April 2019 (8) CT scan of the abdomen and pelvis 08/04/2017, MRI of the liver and total spine MRI 08/11/2017 and CT scan of the chest and brain on 08/13/2017 showed liver and bone metastases  (a) liver biopsy 09/07/2017 confirms estrogen receptor positive, progesterone receptor negative, HER-2 negative metastatic breast cancer  (b) chest CT scan 08/13/2017 shows multiple bilateral pulmonary nodules.  (c) CT of the brain with and without contrast 08/26/2017 shows no evidence of metastatic disease to the brain  (d) CA 27-29 is informative  (9) fulvestrant started 08/17/2017  (a) palbociclib started 08/31/2017  (b) fulvestrant and palbociclib discontinued 01/04/2018 with evidence of progression in the liver  (10) denosumab/Xgeva started 09/14/2017  (a) held after 03/01/2018 dose because concern re. osteonecrosis  (b) resumed 06/09/2018  (c) 02/21/2019 dose held due to oral surgery, resumed 03/21/2019  (11) anastrozole started 01/04/2018  (a) everolimus started 01/29/2018  (b) liver MRI and bone scan December 2019 show stable disease  (c) chest CT scan on 08/02/2018 shows lung lesions resolved, bone lesions stable  (d) bone scan  09/13/2018 essentially stable  (e) liver MRI 12/08/2018 shows improvement in hepatic metastsases  (f) liver MRI and bone scan 04/13/2019 showed continuing improvement in the liver, stable bone lesions  (g) chest CT 08/01/2019 stable, no evidence of active disease ows  (12) 01/28/18 prophylactic left femur intramedullary nail surgery for impending left femur pathologic fracture  (a) palliative radiation with Dr. [name] to the left femur and left ilium from 02/16/2018 to 03/01/2018  (13) [facility] report on liver biopsy from April 2019 shows a negative PDL 1, stable MSI and proficient mismatch repair status.  The tumor is positive for the androgen receptor, and for PTEN for PIK3 CA mutations.  HER-2/neu was read as equivocal (it was negative by FISH on the pathology report).  (14) Progression noted in liver MRI on 01/11/2020; fulvestrant started on 01/30/2020  (a) alpelisib is started 01/30/2020 at 300 mg daily with Metformin 500 mg daily  (b) alpelisib held on 02/07/2020 with very high blood sugars.  Metformin increased to 1 g daily  (c) alpelisib resumed at 150 mg daily on 02/13/2020   PLAN: Lesly has done a very good job of monitoring her blood sugars and they are now back to baseline.  The symptoms she had from her very high blood sugars have pretty much resolved.  She is tolerating 1 g of Metformin well, with very minimal loose bowel movements, just once daily.  Accordingly I think we can give the alpelisib a second try at a lower dose.  She will take 150 mg daily.  She will continue to monitor her blood sugar twice daily and I have asked her to call us every couple of days regardless of what numbers she gets but absolutely call us if any blood sugar is greater than 250.  She is already scheduled to return 02/29/2020, and she will see me that same day.  If we are on a stable dose of alpelisib and she is tolerating the fulvestrant well we will go to monthly after that with restaging after 3  months  Total encounter time 35 minutes.Sarajane Jews C. Kavish Lafitte, MD 02/13/20 8:59 AM Medical Oncology and Hematology [facility] [place], [place] [address] Tel. [phone]    Fax. [phone]   I, Wilburn Mylar, am acting as scribe for Dr. Virgie Dad. Tashunda Vandezande.  Lindie Spruce MD, have reviewed the above documentation for  accuracy and completeness, and I agree with the above.    *Total Encounter Time as defined by the Centers for Medicare and Medicaid Services includes, in addition to the face-to-face time of a patient visit (documented in the note above) non-face-to-face time: obtaining and reviewing outside history, ordering and reviewing medications, tests or procedures, care coordination (communications with other health care professionals or caregivers) and documentation in the medical record.

## 2020-02-13 ENCOUNTER — Inpatient Hospital Stay: Payer: 59

## 2020-02-13 ENCOUNTER — Inpatient Hospital Stay: Payer: 59 | Attending: Oncology | Admitting: Oncology

## 2020-02-13 ENCOUNTER — Other Ambulatory Visit: Payer: Self-pay

## 2020-02-13 VITALS — BP 154/83 | HR 92 | Resp 18

## 2020-02-13 VITALS — BP 147/75 | HR 96 | Temp 97.5°F | Resp 18 | Ht 66.5 in | Wt 260.5 lb

## 2020-02-13 DIAGNOSIS — F329 Major depressive disorder, single episode, unspecified: Secondary | ICD-10-CM | POA: Insufficient documentation

## 2020-02-13 DIAGNOSIS — Z923 Personal history of irradiation: Secondary | ICD-10-CM | POA: Insufficient documentation

## 2020-02-13 DIAGNOSIS — Z170 Estrogen receptor positive status [ER+]: Secondary | ICD-10-CM | POA: Diagnosis not present

## 2020-02-13 DIAGNOSIS — C50012 Malignant neoplasm of nipple and areola, left female breast: Secondary | ICD-10-CM

## 2020-02-13 DIAGNOSIS — Z9013 Acquired absence of bilateral breasts and nipples: Secondary | ICD-10-CM | POA: Insufficient documentation

## 2020-02-13 DIAGNOSIS — Z800 Family history of malignant neoplasm of digestive organs: Secondary | ICD-10-CM | POA: Insufficient documentation

## 2020-02-13 DIAGNOSIS — I10 Essential (primary) hypertension: Secondary | ICD-10-CM | POA: Insufficient documentation

## 2020-02-13 DIAGNOSIS — C50112 Malignant neoplasm of central portion of left female breast: Secondary | ICD-10-CM

## 2020-02-13 DIAGNOSIS — Z7189 Other specified counseling: Secondary | ICD-10-CM

## 2020-02-13 DIAGNOSIS — C50911 Malignant neoplasm of unspecified site of right female breast: Secondary | ICD-10-CM | POA: Diagnosis not present

## 2020-02-13 DIAGNOSIS — C50211 Malignant neoplasm of upper-inner quadrant of right female breast: Secondary | ICD-10-CM

## 2020-02-13 DIAGNOSIS — Z975 Presence of (intrauterine) contraceptive device: Secondary | ICD-10-CM | POA: Insufficient documentation

## 2020-02-13 DIAGNOSIS — Z7984 Long term (current) use of oral hypoglycemic drugs: Secondary | ICD-10-CM | POA: Insufficient documentation

## 2020-02-13 DIAGNOSIS — E785 Hyperlipidemia, unspecified: Secondary | ICD-10-CM | POA: Insufficient documentation

## 2020-02-13 DIAGNOSIS — C787 Secondary malignant neoplasm of liver and intrahepatic bile duct: Secondary | ICD-10-CM | POA: Diagnosis not present

## 2020-02-13 DIAGNOSIS — F419 Anxiety disorder, unspecified: Secondary | ICD-10-CM | POA: Diagnosis not present

## 2020-02-13 DIAGNOSIS — Z5111 Encounter for antineoplastic chemotherapy: Secondary | ICD-10-CM | POA: Diagnosis not present

## 2020-02-13 DIAGNOSIS — Z8249 Family history of ischemic heart disease and other diseases of the circulatory system: Secondary | ICD-10-CM | POA: Insufficient documentation

## 2020-02-13 DIAGNOSIS — Z79899 Other long term (current) drug therapy: Secondary | ICD-10-CM | POA: Diagnosis not present

## 2020-02-13 DIAGNOSIS — C50912 Malignant neoplasm of unspecified site of left female breast: Secondary | ICD-10-CM | POA: Diagnosis not present

## 2020-02-13 DIAGNOSIS — C7951 Secondary malignant neoplasm of bone: Secondary | ICD-10-CM

## 2020-02-13 DIAGNOSIS — Z833 Family history of diabetes mellitus: Secondary | ICD-10-CM | POA: Diagnosis not present

## 2020-02-13 DIAGNOSIS — C50011 Malignant neoplasm of nipple and areola, right female breast: Secondary | ICD-10-CM

## 2020-02-13 LAB — CBC WITH DIFFERENTIAL/PLATELET
Abs Immature Granulocytes: 0.04 10*3/uL (ref 0.00–0.07)
Basophils Absolute: 0 10*3/uL (ref 0.0–0.1)
Basophils Relative: 0 %
Eosinophils Absolute: 0.2 10*3/uL (ref 0.0–0.5)
Eosinophils Relative: 2 %
HCT: 37.3 % (ref 36.0–46.0)
Hemoglobin: 12.2 g/dL (ref 12.0–15.0)
Immature Granulocytes: 1 %
Lymphocytes Relative: 7 %
Lymphs Abs: 0.6 10*3/uL — ABNORMAL LOW (ref 0.7–4.0)
MCH: 26.8 pg (ref 26.0–34.0)
MCHC: 32.7 g/dL (ref 30.0–36.0)
MCV: 81.8 fL (ref 80.0–100.0)
Monocytes Absolute: 0.6 10*3/uL (ref 0.1–1.0)
Monocytes Relative: 7 %
Neutro Abs: 6.8 10*3/uL (ref 1.7–7.7)
Neutrophils Relative %: 83 %
Platelets: 291 10*3/uL (ref 150–400)
RBC: 4.56 MIL/uL (ref 3.87–5.11)
RDW: 17.9 % — ABNORMAL HIGH (ref 11.5–15.5)
WBC: 8.3 10*3/uL (ref 4.0–10.5)
nRBC: 0 % (ref 0.0–0.2)

## 2020-02-13 LAB — COMPREHENSIVE METABOLIC PANEL
ALT: 17 U/L (ref 0–44)
AST: 13 U/L — ABNORMAL LOW (ref 15–41)
Albumin: 3.2 g/dL — ABNORMAL LOW (ref 3.5–5.0)
Alkaline Phosphatase: 167 U/L — ABNORMAL HIGH (ref 38–126)
Anion gap: 10 (ref 5–15)
BUN: 10 mg/dL (ref 6–20)
CO2: 27 mmol/L (ref 22–32)
Calcium: 9.9 mg/dL (ref 8.9–10.3)
Chloride: 103 mmol/L (ref 98–111)
Creatinine, Ser: 0.85 mg/dL (ref 0.44–1.00)
GFR calc non Af Amer: >60 mL/min (ref >60)
Glucose, Bld: 118 mg/dL — ABNORMAL HIGH (ref 70–99)
Potassium: 3.2 mmol/L — ABNORMAL LOW (ref 3.5–5.1)
Sodium: 140 mmol/L (ref 135–145)
Total Bilirubin: 0.3 mg/dL (ref 0.3–1.2)
Total Protein: 7.4 g/dL (ref 6.5–8.1)

## 2020-02-13 MED ORDER — FULVESTRANT 250 MG/5ML IM SOLN
INTRAMUSCULAR | Status: AC
  Filled 2020-02-13: qty 10

## 2020-02-13 MED ORDER — FULVESTRANT 250 MG/5ML IM SOLN
500.0000 mg | Freq: Once | INTRAMUSCULAR | Status: AC
  Administered 2020-02-13: 500 mg via INTRAMUSCULAR

## 2020-02-13 NOTE — Patient Instructions (Signed)
Fulvestrant injection What is this medicine? FULVESTRANT (ful VES trant) blocks the effects of estrogen. It is used to treat breast cancer. This medicine may be used for other purposes; ask your health care provider or pharmacist if you have questions. COMMON BRAND NAME(S): FASLODEX What should I tell my health care provider before I take this medicine? They need to know if you have any of these conditions:  bleeding disorders  liver disease  low blood counts, like low white cell, platelet, or red cell counts  an unusual or allergic reaction to fulvestrant, other medicines, foods, dyes, or preservatives  pregnant or trying to get pregnant  breast-feeding How should I use this medicine? This medicine is for injection into a muscle. It is usually given by a health care professional in a hospital or clinic setting. Talk to your pediatrician regarding the use of this medicine in children. Special care may be needed. Overdosage: If you think you have taken too much of this medicine contact a poison control center or emergency room at once. NOTE: This medicine is only for you. Do not share this medicine with others. What if I miss a dose? It is important not to miss your dose. Call your doctor or health care professional if you are unable to keep an appointment. What may interact with this medicine?  medicines that treat or prevent blood clots like warfarin, enoxaparin, dalteparin, apixaban, dabigatran, and rivaroxaban This list may not describe all possible interactions. Give your health care provider a list of all the medicines, herbs, non-prescription drugs, or dietary supplements you use. Also tell them if you smoke, drink alcohol, or use illegal drugs. Some items may interact with your medicine. What should I watch for while using this medicine? Your condition will be monitored carefully while you are receiving this medicine. You will need important blood work done while you are taking  this medicine. Do not become pregnant while taking this medicine or for at least 1 year after stopping it. Women of child-bearing potential will need to have a negative pregnancy test before starting this medicine. Women should inform their doctor if they wish to become pregnant or think they might be pregnant. There is a potential for serious side effects to an unborn child. Men should inform their doctors if they wish to father a child. This medicine may lower sperm counts. Talk to your health care professional or pharmacist for more information. Do not breast-feed an infant while taking this medicine or for 1 year after the last dose. What side effects may I notice from receiving this medicine? Side effects that you should report to your doctor or health care professional as soon as possible:  allergic reactions like skin rash, itching or hives, swelling of the face, lips, or tongue  feeling faint or lightheaded, falls  pain, tingling, numbness, or weakness in the legs  signs and symptoms of infection like fever or chills; cough; flu-like symptoms; sore throat  vaginal bleeding Side effects that usually do not require medical attention (report to your doctor or health care professional if they continue or are bothersome):  aches, pains  constipation  diarrhea  headache  hot flashes  nausea, vomiting  pain at site where injected  stomach pain This list may not describe all possible side effects. Call your doctor for medical advice about side effects. You may report side effects to FDA at 1-800-FDA-1088. Where should I keep my medicine? This drug is given in a hospital or clinic and will   not be stored at home. NOTE: This sheet is a summary. It may not cover all possible information. If you have questions about this medicine, talk to your doctor, pharmacist, or health care provider.  2020 Elsevier/Gold Standard (2017-08-05 11:34:41)  

## 2020-02-15 ENCOUNTER — Telehealth: Payer: Self-pay | Admitting: *Deleted

## 2020-02-15 ENCOUNTER — Other Ambulatory Visit: Payer: Self-pay | Admitting: *Deleted

## 2020-02-15 DIAGNOSIS — C50211 Malignant neoplasm of upper-inner quadrant of right female breast: Secondary | ICD-10-CM

## 2020-02-15 DIAGNOSIS — Z170 Estrogen receptor positive status [ER+]: Secondary | ICD-10-CM

## 2020-02-15 MED ORDER — METFORMIN HCL ER 500 MG PO TB24: 1500.0000 mg | ORAL_TABLET | Freq: Every day | ORAL | 2 refills | Status: DC

## 2020-02-15 MED FILL — METFORMIN HCL ER 500 MG TB2: 500 | 30 days supply | Qty: 90 | Fill #0

## 2020-02-15 NOTE — Progress Notes (Signed)
Per MD pt to start taking Metformin 500 mg TID (1500 mg total daily) to help control blood sugars while on Piqray.  Pt notified and verbalized understanding.  Prescription sent to pharmacy on file.

## 2020-02-15 NOTE — Telephone Encounter (Signed)
Pt called & reports that her blood sugar is 257 this am.  It was 175 yest am & 131 yest eve. She reports feeling OK & no diarrhea.  She is on Piqray 150 mg daily.  She reports that she is on Metformin 1000 mg daily & will need refill.  Message to Dr [name].

## 2020-02-19 ENCOUNTER — Telehealth: Payer: Self-pay

## 2020-02-19 NOTE — Telephone Encounter (Signed)
Pt called to report the following blood sugars:   10/6 - AM 173; PM 131 10/7 - AM 257; PM 97 10/8 - AM 249; PM 178 10/9 - AM 210; PM 75 10/10 - AM 211; PM 129 10/11 - AM 260  Pt currently on Piqray 150mg  - 1 tablet daily and Metformin 500 mg TID.   RN reviewed with MD -  No changes at this time.  Continue to check blood sugars and notify in one week.   Pt verbalized understanding and agreement.

## 2020-02-27 ENCOUNTER — Telehealth: Payer: Self-pay

## 2020-02-27 ENCOUNTER — Other Ambulatory Visit: Payer: Self-pay | Admitting: Oncology

## 2020-02-27 MED FILL — PREGABALIN 100 MG CAPS: 100 | 30 days supply | Qty: 90 | Fill #0

## 2020-02-27 NOTE — Telephone Encounter (Signed)
Pt called and states she has been monitoring her blood glucose level since 02/19/20 and results are as follow:  02/19/20 AM: 260 ; PM: 105 02/20/20 AM: 231; PM: 90 02/21/20 AM: 162; PM: 96 02/22/20 AM: 219; PM: 119 02/23/20 AM: 222; PM: 153 02/24/20 AM: 221; PM: 162 02/25/20 AM: 187; PM: 114 02/26/20 AM: 182; PM: 107 02/27/20 AM: 186  Pt states all AM glucose readings are fasting, and all PM checks were at 1900. Will make Dr [name] aware.

## 2020-02-27 NOTE — Telephone Encounter (Signed)
Please send refill if appropriate.  Unable to refill on our end due to being a controlled substance.  Thanks

## 2020-02-28 NOTE — Progress Notes (Signed)
[place]  [phone] [phone]    ID: Debbie [name] DOB: Jul 30, 1965  MR#: [id number]  QHU#:[id number]  Patient Care Team: Debbie Pillar, MD as PCP - General (Family Medicine) Debbie Pi, MD as Consulting Physician (Endocrinology) Debbie Kussmaul, MD as Consulting Physician (General Surgery) Debbie [name], Debbie Dad, MD as Consulting Physician (Oncology) Debbie Gibson, MD as Attending Physician (Radiation Oncology) Debbie Crome, MD as Consulting Physician (Cardiology) Debbie Reese, MD as Consulting Physician (Plastic Surgery) Debbie [name], Debbie Massed, NP as Nurse Practitioner (Hematology and Oncology) Debbie [name] (Inactive) Debbie [name], DMD (Dentistry) OTHER MD:   CHIEF COMPLAINT: Estrogen receptor positive stage IV  breast cancer (s/p bilateral mastectomies)  CURRENT TREATMENT: Fulvestrant; Alepelisib; denosumab/Xgeva   INTERVAL HISTORY: Debbie [name] returns today for follow-up and treatment of her estrogen receptor positive stage IV breast cancer.  She began Fulvestrant on 01/30/2020. She will receive her third dose today, after which the doses will be every 4 weeks.  She continues on Xgeva every 12 weeks, with her most recent dose on 01/24/2020.  She has no side effects from this that she is aware of  She was restarted on alpelisib at 150 mg on 02/13/2020.  Aside from problems with the blood sugar she is tolerating it well.   REVIEW OF SYSTEMS: Debbie [name] tells me that her sugars are little bit higher in the morning than in the evening but none of the one she showed me are over 200 for the last week.  That is certainly an improvement.  She is having every 3days some loose bowel movements, and then has no bowel movement for a day or 2 then is normal and then it repeats.  She is not having frank diarrhea.  She does not have any nausea vomiting mouth sores or intercurrent fevers.  Her energy is down a little but she continues to work  full-time and after taking a little break when she gets home she gets onto her housework and cooking.  She has already had her booster for Covid and also her flu shot     BREAST CANCER HISTORY: From the original intake note:  [name] had screening mammography with tomosynthesis at the [facility] 10/02/2015. This showed 2 possible masses in the right breast as well as some suspicious calcifications. The left breast was unremarkable. On 10/15/2015 she underwent right diagnostic mammography with tomography and right breast ultrasonography. The breast density was category B. Spot magnification views confirmed an area of pleomorphic calcifications measuring up to 5 cm in the upper half of the breast. Also in the upper outer right breast there were adjacent low-density masses without distortion or calcifications. On physical exam there was no palpable abnormality.  Targeted ultrasound of the right breast found the 2 "masses" in the right upper quadrant were benign cysts measuring 0.8 and 0.5 cm respectively. Biopsy of the area of calcification on 10/15/2015 showed ([id number]) invasive ductal carcinoma, E-cadherin positive, grade 2, estrogen receptor 90% positive, progesterone receptor 50% positive, both with strong staining intensity, with an MIB-1 of 70%, and no HER-2 amplification, the signals ratio being 1.18 and the number per cell 2.60.  Her subsequent history is as detailed below    PAST MEDICAL HISTORY: Past Medical History:  Diagnosis Date  . Anxiety   . Arthritis   . Bone metastasis ([place])   . Breast cancer of upper-inner quadrant of right female breast [facility]) oncologist-  dr [name]--- 04/ 2019 ER/PR+ Stage IV w/ metastatic disease to liver,  total spine, lung, bone   dx 06/ 2017  right breast invasive ductal carcinoma, Stage IIB, ER/PR + (pT2 pN1), grade 1-- s/p  right breast lumpecotmy w/ sln bx's and bilateral breast reduction 11-28-2015/  left breast with reduction , dx invasive  ductal ca, Grade 1 (pT1cpNX) ER/PR positive/  adjuvant radiation completed 03-27-2016 to right chest wall, 2018  reclassifiec Stage IIA  . Cancer, metastatic to liver ([place])   . Chronic back pain   . Constipation   . Depression   . Family history of adverse reaction to anesthesia     mother has Copd was kept on vent   . Headache   . Heart murmur   . History of hyperthyroidism    s/p  RAI  . History of radiation therapy 02/11/16- 03/27/16   Right Chest Wall 50.4 Gy in 28 fractions, Right Chest Wall Boost 10 Gy in 5 fractions.   . Hyperlipidemia   . Hypertension   . Hypothyroidism, postradioiodine therapy    endocrinoloigst-  dr balan--  2004  s/p  RAI  . Menorrhagia 2011  . Multiple pulmonary nodules    secondary  metastatic  . PONV (postoperative nausea and vomiting)    patch helps   . SUI (stress urinary incontinence, female)   . Wears contact lenses   . Wears dentures    full upper and lower partial    PAST SURGICAL HISTORY: Past Surgical History:  Procedure Laterality Date  . BREAST LUMPECTOMY WITH NEEDLE LOCALIZATION AND AXILLARY SENTINEL LYMPH NODE BX Right 11/28/2015   Procedure: RIGHT BREAST LUMPECTOMY WITH NEEDLE LOCALIZATION AND AXILLARY SENTINEL LYMPH NODE BX;  Surgeon: Debbie Messing III, MD;  Location: [place];  Service: General;  Laterality: Right;  . BREAST REDUCTION SURGERY Bilateral 11/28/2015   Procedure: MAMMARY REDUCTION  (BREAST) BILATERAL;  Surgeon: Debbie Reese, MD;  Location: [place];  Service: Plastics;  Laterality: Bilateral;  . CESAREAN SECTION  01/12/1993  . COLONOSCOPY    . Dental surgeries    . FEMUR IM NAIL Left 01/28/2018   Procedure: INTRAMEDULLARY (IM) NAIL FEMORAL;  Surgeon: Nicholes Stairs, MD;  Location: [place];  Service: Orthopedics;  Laterality: Left;  . HYSTEROSCOPY N/A 09/02/2017   Procedure: HYSTEROSCOPY  to remove IUD;  Surgeon: Eldred Manges, MD;  Location[facility];  Service: Gynecology;  Laterality: N/A;  . Liver  biospy    . MASTECTOMY    . MASTECTOMY W/ SENTINEL NODE BIOPSY Bilateral 12/26/2015   Procedure: BILATERAL MASTECTOMY WITH LEFT SENTINEL LYMPH NODE BIOPSY;  Surgeon: Debbie Messing III, MD;  Location: [place];  Service: General;  Laterality: Bilateral;  . Removal of IUD    . TUBAL LIGATION Bilateral 1995    FAMILY HISTORY: Family History  Problem Relation Age of Onset  . Diabetes Mother   . Hypertension Mother   . Hypertension Father   . Diabetes Maternal Grandmother   . Hypertension Sister   . Cancer Paternal Grandmother        colon  . Colon cancer Paternal Grandmother   . Esophageal cancer Neg Hx   . Stomach cancer Neg Hx   . Rectal cancer Neg Hx    The patient's parents are still living, in their early 81s as of June 2017. The patient has one brother, 2 sisters. On the maternal side there is a history of uterine and prostate cancer. On the paternal side there is a history of colon cancer and possibly uterine cancer. There is no history  of breast or ovarian cancer in the family.    GYNECOLOGIC HISTORY:  No LMP recorded. (Menstrual status: IUD).  Menarche age 57. She still having regular periods. She is GX P1, first pregnancy age 54. She used oral contraceptives for a few years remotely, with no complications. She is currently on a Mirena IUD and also is status post bilateral tubal ligation.   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 Her husband Debbie [name] is a Administrator.  They are both taking appropriate precautions at work.  Their son Debbie [name], age 32, is a radio DJ.  He no longer lives with the patient.  The patient has no grandchildren. She is not a Ambulance person.   ADVANCED DIRECTIVES: In the absence of any documents to the contrary her husband is her healthcare power of attorney   HEALTH MAINTENANCE: Social History   Tobacco Use  . Smoking status: Never Smoker  . Smokeless tobacco: Never Used  Vaping Use  . Vaping Use:  Never used  Substance Use Topics  . Alcohol use: No  . Drug use: No     Colonoscopy: January 2016   PAP: January 2016   Bone density: August 2017 showed a T score of +0.7 normal  Lipid panel:  Allergies  Allergen Reactions  . Ace Inhibitors Cough  . Atorvastatin Other (See Comments)    Joint pain  . Simvastatin Other (See Comments)    Joint pain    Current Outpatient Medications  Medication Sig Dispense Refill  . alpelisib (PIQRAY, 300 MG DAILY DOSE,) 2 x 150 MG Therapy Pack Take 150 mg by mouth 1 day or 1 dose for 1 dose. Take two 193m tablets with food at the same time daily. Swallow whole, do not crush, chew, or split. 56 each 6  . blood glucose meter kit and supplies KIT Dispense based on patient and insurance preference. Use up to four times daily as directed. (FOR ICD-9 250.00, 250.01). 1 each 0  . Calcium-Magnesium 250-125 MG TABS Take 1 tablet by mouth daily. 120 each 4  . cetirizine (ZYRTEC ALLERGY) 10 MG tablet Take 10 mg by mouth daily as needed for allergies.     . chlorhexidine (PERIDEX) 0.12 % solution     . Ferrous Sulfate (IRON) 325 (65 Fe) MG TABS Take 1 tablet (325 mg total) by mouth daily. 30 each 2  . fluconazole (DIFLUCAN) 100 MG tablet 2 tablets first dose then 1 tab daily 8 tablet 0  . furosemide (LASIX) 20 MG tablet Take 20 mg by mouth daily as needed.    .Marland Kitchenibuprofen (ADVIL) 800 MG tablet Take 800 mg by mouth every 6 (six) hours as needed.    .Marland Kitchenibuprofen (ADVIL,MOTRIN) 600 MG tablet Take 1 tablet (600 mg total) by mouth every 6 (six) hours as needed for moderate pain. 60 tablet 0  . Lancets (ONETOUCH ULTRASOFT) lancets Use as instructed 100 each 12  . levothyroxine (SYNTHROID, LEVOTHROID) 137 MCG tablet Take 137 mcg by mouth daily before breakfast.     . LORazepam (ATIVAN) 0.5 MG tablet Take 1 tablet (0.5 mg total) by mouth at bedtime as needed for anxiety. 30 tablet 0  . losartan (COZAAR) 100 MG tablet Take 100 mg by mouth every morning.     .Marland Kitchen losartan-hydrochlorothiazide (HYZAAR) 100-25 MG tablet Take 1 tablet by mouth daily.    . metFORMIN (GLUCOPHAGE XR) 500 MG 24 hr tablet Take 2 tablets (1,000 mg total) by mouth  in the morning and at bedtime. 120 tablet 4  . Multiple Vitamin (MULTIVITAMIN) tablet Take 1 tablet by mouth daily.    Marland Kitchen omega-3 acid ethyl esters (LOVAZA) 1 g capsule Take 1 g by mouth 2 (two) times daily.     . potassium chloride SA (K-DUR,KLOR-CON) 20 MEQ tablet Take 40 mEq by mouth daily.     . pregabalin (LYRICA) 100 MG capsule TAKE 1 CAPSULE BY MOUTH THREE TIMES DAILY 90 capsule 0  . prochlorperazine (COMPAZINE) 10 MG tablet Take 1 tablet (10 mg total) by mouth every 6 (six) hours as needed for nausea or vomiting. 30 tablet 0  . rosuvastatin (CRESTOR) 5 MG tablet Take 1 tablet (5 mg total) by mouth daily.    . sertraline (ZOLOFT) 100 MG tablet Take 1 tablet (100 mg total) by mouth daily.    . traMADol (ULTRAM) 50 MG tablet Take 50 mg by mouth every 6 (six) hours as needed.    . triamterene-hydrochlorothiazide (DYAZIDE) 37.5-25 MG capsule TAKE 1 CAPSULE BY MOUTH ONCE DAILY 90 capsule 0  . Vitamin D, Cholecalciferol, 1000 units CAPS Take 1 tablet by mouth daily. (Patient taking differently: Take 1,000 Units by mouth daily. ) 90 capsule 4   No current facility-administered medications for this visit.    OBJECTIVE: African-American woman who appears stated age  35:   02/29/20 0807  BP: (!) 147/76  Pulse: 87  Resp: 18  Temp: 98 F (36.7 C)  SpO2: 96%     Body mass index is 41.27 kg/m.    ECOG FS:1 - Symptomatic but completely ambulatory  Filed Weights   02/29/20 0807  Weight: 259 lb 9.6 oz (117.8 kg)     Sclerae unicteric, EOMs intact Wearing a mask No cervical or supraclavicular adenopathy Lungs no rales or rhonchi Heart regular rate and rhythm Abd soft, nontender, positive bowel sounds MSK no focal spinal tenderness, no upper extremity lymphedema Neuro: nonfocal, well oriented, appropriate affect  Breasts: Deferred  LAB RESULTS:  CMP     Component Value Date/Time   NA 141 02/29/2020 0734   NA 140 07/08/2016 0948   K 3.2 (L) 02/29/2020 0734   K 4.1 07/08/2016 0948   CL 100 02/29/2020 0734   CO2 30 02/29/2020 0734   CO2 24 07/08/2016 0948   GLUCOSE 179 (H) 02/29/2020 0734   GLUCOSE 89 07/08/2016 0948   BUN 11 02/29/2020 0734   BUN 11.9 07/08/2016 0948   CREATININE 1.01 (H) 02/29/2020 0734   CREATININE 0.77 05/16/2019 0905   CREATININE 0.9 07/08/2016 0948   CALCIUM 9.7 02/29/2020 0734   CALCIUM 10.0 07/08/2016 0948   PROT 7.2 02/29/2020 0734   PROT 7.6 07/08/2016 0948   ALBUMIN 3.2 (L) 02/29/2020 0734   ALBUMIN 3.6 07/08/2016 0948   AST 10 (L) 02/29/2020 0734   AST 35 05/16/2019 0905   AST 22 07/08/2016 0948   ALT 11 02/29/2020 0734   ALT 40 05/16/2019 0905   ALT 27 07/08/2016 0948   ALKPHOS 168 (H) 02/29/2020 0734   ALKPHOS 115 07/08/2016 0948   BILITOT 0.3 02/29/2020 0734   BILITOT 0.7 05/16/2019 0905   BILITOT 0.35 07/08/2016 0948   GFRNONAA >60 02/29/2020 0734   GFRNONAA >60 05/16/2019 0905   GFRAA >60 01/30/2020 0814   GFRAA >60 05/16/2019 0905    INo results found for: SPEP, UPEP  Lab Results  Component Value Date   WBC 6.7 02/29/2020   NEUTROABS 5.4 02/29/2020   HGB 12.5 02/29/2020   HCT  38.2 02/29/2020   MCV 83.4 02/29/2020   PLT 294 02/29/2020      Chemistry      Component Value Date/Time   NA 141 02/29/2020 0734   NA 140 07/08/2016 0948   K 3.2 (L) 02/29/2020 0734   K 4.1 07/08/2016 0948   CL 100 02/29/2020 0734   CO2 30 02/29/2020 0734   CO2 24 07/08/2016 0948   BUN 11 02/29/2020 0734   BUN 11.9 07/08/2016 0948   CREATININE 1.01 (H) 02/29/2020 0734   CREATININE 0.77 05/16/2019 0905   CREATININE 0.9 07/08/2016 0948      Component Value Date/Time   CALCIUM 9.7 02/29/2020 0734   CALCIUM 10.0 07/08/2016 0948   ALKPHOS 168 (H) 02/29/2020 0734   ALKPHOS 115 07/08/2016 0948   AST 10 (L) 02/29/2020 0734   AST 35 05/16/2019 0905    AST 22 07/08/2016 0948   ALT 11 02/29/2020 0734   ALT 40 05/16/2019 0905   ALT 27 07/08/2016 0948   BILITOT 0.3 02/29/2020 0734   BILITOT 0.7 05/16/2019 0905   BILITOT 0.35 07/08/2016 0948       No results found for: LABCA2  No components found for: OXBDZ329  No results for input(s): INR in the last 168 hours.  Urinalysis No results found for: COLORURINE, APPEARANCEUR, LABSPEC, PHURINE, GLUCOSEU, HGBUR, BILIRUBINUR, KETONESUR, PROTEINUR, UROBILINOGEN, NITRITE, LEUKOCYTESUR   STUDIES: No results found.   ELIGIBLE FOR AVAILABLE RESEARCH PROTOCOL: PALLAS, but inelegible because of Mirena IUD   ASSESSMENT: 54 y.o. [place] woman status post right breast upper inner quadrant biopsy 10/15/2015 for a clinical T2-T3 N0, stage II invasive ductal carcinoma, estrogen and progesterone receptor positive, HER-2 nonamplified, with an Mib-1 of 70%  (1) status post right lumpectomy and sentinel lymph node sampling with oncoplastic breast reduction 11/28/2015 for a pT2 pN1, stage IIB invasive ductal carcinoma, grade 1, with negative margins (1/1 sentinel node positive)  (a) reclassified as stage IIA in the 2018 prognostic revision   (2) left reduction mammoplasty 11/28/2015 unexpectedly found a pT1c pNX invasive ductal carcinoma, grade 1, estrogen and progesterone receptor positive, HER-2 not amplified, with an MIB-1 of 3%  (3) Mammaprint from the right sided breast cancer was "low risk", predicting a 98% chance of no disease recurrence within 5 years with anti-estrogens as the only systemic therapy. It also predicts minimal to no benefit from chemotherapy  (4) status post bilateral mastectomies with bilateral sentinel lymph node sampling 12/26/2015, showing  (a) on the right, no residual carcinoma (0/3 nodes involved)  (b) on the left, focal ductal carcinoma in situ, 0.2 cm, with negative margins; (0/4 nodes involved)  (5) adjuvant radiation 02/11/16 - 03/27/16    1) Right chest wall: 50.4  Gy in 28 fractions               2) Right chest wall boost: 10 Gy in 5 fractions   (6) started on tamoxifen neoadjuvantly 10/23/2015 to allow for expected delays in definitive local treatment  (a) Mirena IUD in place  (b) tamoxifen discontinued 08/13/2017, with progression  (7) since she had 4 lymph nodes removed from each axilla but also receive radiation on the right, lab draws should preferentially be obtained from the left arm   METASTATIC DISEASE: April 2019 (8) CT scan of the abdomen and pelvis 08/04/2017, MRI of the liver and total spine MRI 08/11/2017 and CT scan of the chest and brain on 08/13/2017 showed liver and bone metastases  (a) liver biopsy 09/07/2017 confirms estrogen receptor  positive, progesterone receptor negative, HER-2 negative metastatic breast cancer  (b) chest CT scan 08/13/2017 shows multiple bilateral pulmonary nodules.  (c) CT of the brain with and without contrast 08/26/2017 shows no evidence of metastatic disease to the brain  (d) CA 27-29 is informative  (9) fulvestrant started 08/17/2017  (a) palbociclib started 08/31/2017  (b) fulvestrant and palbociclib discontinued 01/04/2018 with evidence of progression in the liver  (10) denosumab/Xgeva started 09/14/2017  (a) held after 03/01/2018 dose because concern re. osteonecrosis  (b) resumed 06/09/2018  (c) 02/21/2019 dose held due to oral surgery, resumed 03/21/2019  (11) anastrozole started 01/04/2018  (a) everolimus started 01/29/2018  (b) liver MRI and bone scan December 2019 show stable disease  (c) chest CT scan on 08/02/2018 shows lung lesions resolved, bone lesions stable  (d) bone scan 09/13/2018 essentially stable  (e) liver MRI 12/08/2018 shows improvement in hepatic metastsases  (f) liver MRI and bone scan 04/13/2019 showed continuing improvement in the liver, stable bone lesions  (g) chest CT 08/01/2019 stable, no evidence of active disease ows  (12) 01/28/18 prophylactic left femur  intramedullary nail surgery for impending left femur pathologic fracture  (a) palliative radiation with Dr. [name] to the left femur and left ilium from 02/16/2018 to 03/01/2018  (13) [facility] report on liver biopsy from April 2019 shows a negative PDL 1, stable MSI and proficient mismatch repair status.  The tumor is positive for the androgen receptor, and for PTEN for PIK3 CA mutations.  HER-2/neu was read as equivocal (it was negative by FISH on the pathology report).  (14) Progression noted in liver MRI on 01/11/2020; fulvestrant started on 01/30/2020  (a) alpelisib is started 01/30/2020 at 300 mg daily with Metformin 500 mg daily  (b) alpelisib held on 02/07/2020 with very high blood sugars.  Metformin increased to 1 g daily  (c) alpelisib resumed at 150 mg daily on 02/13/2020   PLAN: Simrin is tolerating Piqray at 150 mg daily generally well.  She is also tolerating the Metformin 3 tablets daily without significant diarrhea.  I think we have room for improvement so we are going to go to 1000 mg twice daily on the Metformin.  I have asked her to check her sugar for the next 4 days just to make sure we do not get too low and of course to call us if she develops diarrhea.  Assuming that the blood sugar is well controlled and we do not get a lot of diarrhea problems then this is going to be the treatment plan.  She will receive the Xgeva every 4 weeks to correlate with the Faslodex doses and she will receive both of those today.  Her next treatment here is already scheduled for March 28, 2020 and I will see her with the treatment in December.  Around that time we will probably set her up for restaging studies  Total encounter time 30 minutes.Sarajane Jews C. Anelle Parlow, MD 02/29/20 8:28 AM Medical Oncology and Hematology [facility] [place], [place] [address] Tel. [phone]    Fax. [phone]   I, Wilburn Mylar, am acting as scribe for Dr. Virgie [name].  Carrina Schoenberger.  I, Lurline Del MD, have reviewed the above documentation for accuracy and completeness, and I agree with the above.   *Total Encounter Time as defined by the Centers for Medicare and Medicaid Services includes, in addition to the face-to-face time of a patient visit (documented in the note above) non-face-to-face time: obtaining and  reviewing outside history, ordering and reviewing medications, tests or procedures, care coordination (communications with other health care professionals or caregivers) and documentation in the medical record.

## 2020-02-29 ENCOUNTER — Other Ambulatory Visit: Payer: Self-pay | Admitting: Oncology

## 2020-02-29 ENCOUNTER — Other Ambulatory Visit: Payer: Self-pay

## 2020-02-29 ENCOUNTER — Inpatient Hospital Stay (HOSPITAL_BASED_OUTPATIENT_CLINIC_OR_DEPARTMENT_OTHER): Payer: 59 | Admitting: Oncology

## 2020-02-29 ENCOUNTER — Inpatient Hospital Stay: Payer: 59

## 2020-02-29 ENCOUNTER — Ambulatory Visit: Payer: 59

## 2020-02-29 VITALS — BP 147/76 | HR 87 | Temp 98.0°F | Resp 18 | Ht 66.5 in | Wt 259.6 lb

## 2020-02-29 DIAGNOSIS — C787 Secondary malignant neoplasm of liver and intrahepatic bile duct: Secondary | ICD-10-CM

## 2020-02-29 DIAGNOSIS — C50112 Malignant neoplasm of central portion of left female breast: Secondary | ICD-10-CM | POA: Diagnosis not present

## 2020-02-29 DIAGNOSIS — F329 Major depressive disorder, single episode, unspecified: Secondary | ICD-10-CM | POA: Diagnosis not present

## 2020-02-29 DIAGNOSIS — C50211 Malignant neoplasm of upper-inner quadrant of right female breast: Secondary | ICD-10-CM

## 2020-02-29 DIAGNOSIS — Z5111 Encounter for antineoplastic chemotherapy: Secondary | ICD-10-CM | POA: Diagnosis not present

## 2020-02-29 DIAGNOSIS — Z170 Estrogen receptor positive status [ER+]: Secondary | ICD-10-CM

## 2020-02-29 DIAGNOSIS — C50011 Malignant neoplasm of nipple and areola, right female breast: Secondary | ICD-10-CM

## 2020-02-29 DIAGNOSIS — C7951 Secondary malignant neoplasm of bone: Secondary | ICD-10-CM

## 2020-02-29 DIAGNOSIS — C50012 Malignant neoplasm of nipple and areola, left female breast: Secondary | ICD-10-CM

## 2020-02-29 DIAGNOSIS — E785 Hyperlipidemia, unspecified: Secondary | ICD-10-CM | POA: Diagnosis not present

## 2020-02-29 DIAGNOSIS — Z923 Personal history of irradiation: Secondary | ICD-10-CM | POA: Diagnosis not present

## 2020-02-29 DIAGNOSIS — Z9013 Acquired absence of bilateral breasts and nipples: Secondary | ICD-10-CM | POA: Diagnosis not present

## 2020-02-29 LAB — COMPREHENSIVE METABOLIC PANEL
ALT: 11 U/L (ref 0–44)
AST: 10 U/L — ABNORMAL LOW (ref 15–41)
Albumin: 3.2 g/dL — ABNORMAL LOW (ref 3.5–5.0)
Alkaline Phosphatase: 168 U/L — ABNORMAL HIGH (ref 38–126)
Anion gap: 11 (ref 5–15)
BUN: 11 mg/dL (ref 6–20)
CO2: 30 mmol/L (ref 22–32)
Calcium: 9.7 mg/dL (ref 8.9–10.3)
Chloride: 100 mmol/L (ref 98–111)
Creatinine, Ser: 1.01 mg/dL — ABNORMAL HIGH (ref 0.44–1.00)
GFR, Estimated: >60 mL/min (ref >60)
Glucose, Bld: 179 mg/dL — ABNORMAL HIGH (ref 70–99)
Potassium: 3.2 mmol/L — ABNORMAL LOW (ref 3.5–5.1)
Sodium: 141 mmol/L (ref 135–145)
Total Bilirubin: 0.3 mg/dL (ref 0.3–1.2)
Total Protein: 7.2 g/dL (ref 6.5–8.1)

## 2020-02-29 LAB — CBC WITH DIFFERENTIAL/PLATELET
Abs Immature Granulocytes: 0.03 10*3/uL (ref 0.00–0.07)
Basophils Absolute: 0 10*3/uL (ref 0.0–0.1)
Basophils Relative: 0 %
Eosinophils Absolute: 0.2 10*3/uL (ref 0.0–0.5)
Eosinophils Relative: 3 %
HCT: 38.2 % (ref 36.0–46.0)
Hemoglobin: 12.5 g/dL (ref 12.0–15.0)
Immature Granulocytes: 0 %
Lymphocytes Relative: 9 %
Lymphs Abs: 0.6 10*3/uL — ABNORMAL LOW (ref 0.7–4.0)
MCH: 27.3 pg (ref 26.0–34.0)
MCHC: 32.7 g/dL (ref 30.0–36.0)
MCV: 83.4 fL (ref 80.0–100.0)
Monocytes Absolute: 0.4 10*3/uL (ref 0.1–1.0)
Monocytes Relative: 6 %
Neutro Abs: 5.4 10*3/uL (ref 1.7–7.7)
Neutrophils Relative %: 82 %
Platelets: 294 10*3/uL (ref 150–400)
RBC: 4.58 MIL/uL (ref 3.87–5.11)
RDW: 18.7 % — ABNORMAL HIGH (ref 11.5–15.5)
WBC: 6.7 10*3/uL (ref 4.0–10.5)
nRBC: 0 % (ref 0.0–0.2)

## 2020-02-29 MED ORDER — METFORMIN HCL ER 500 MG PO TB24: 1000.0000 mg | ORAL_TABLET | Freq: Two times a day (BID) | ORAL | 4 refills | Status: DC

## 2020-02-29 MED ORDER — FULVESTRANT 250 MG/5ML IM SOLN
500.0000 mg | Freq: Once | INTRAMUSCULAR | Status: AC
  Administered 2020-02-29: 500 mg via INTRAMUSCULAR

## 2020-02-29 MED ORDER — DENOSUMAB 120 MG/1.7ML ~~LOC~~ SOLN
120.0000 mg | Freq: Once | SUBCUTANEOUS | Status: AC
  Administered 2020-02-29: 120 mg via SUBCUTANEOUS

## 2020-02-29 MED ORDER — PIQRAY (300 MG DAILY DOSE) 2 X 150 MG PO TBPK: 150.0000 mg | ORAL_TABLET | ORAL | 6 refills | Status: DC

## 2020-02-29 MED ORDER — FULVESTRANT 250 MG/5ML IM SOLN
INTRAMUSCULAR | Status: AC
  Filled 2020-02-29: qty 10

## 2020-02-29 MED ORDER — DENOSUMAB 120 MG/1.7ML ~~LOC~~ SOLN
SUBCUTANEOUS | Status: AC
  Filled 2020-02-29: qty 1.7

## 2020-02-29 NOTE — Patient Instructions (Signed)
Fulvestrant injection What is this medicine? FULVESTRANT (ful VES trant) blocks the effects of estrogen. It is used to treat breast cancer. This medicine may be used for other purposes; ask your health care provider or pharmacist if you have questions. COMMON BRAND NAME(S): FASLODEX What should I tell my health care provider before I take this medicine? They need to know if you have any of these conditions:  bleeding disorders  liver disease  low blood counts, like low white cell, platelet, or red cell counts  an unusual or allergic reaction to fulvestrant, other medicines, foods, dyes, or preservatives  pregnant or trying to get pregnant  breast-feeding How should I use this medicine? This medicine is for injection into a muscle. It is usually given by a health care professional in a hospital or clinic setting. Talk to your pediatrician regarding the use of this medicine in children. Special care may be needed. Overdosage: If you think you have taken too much of this medicine contact a poison control center or emergency room at once. NOTE: This medicine is only for you. Do not share this medicine with others. What if I miss a dose? It is important not to miss your dose. Call your doctor or health care professional if you are unable to keep an appointment. What may interact with this medicine?  medicines that treat or prevent blood clots like warfarin, enoxaparin, dalteparin, apixaban, dabigatran, and rivaroxaban This list may not describe all possible interactions. Give your health care provider a list of all the medicines, herbs, non-prescription drugs, or dietary supplements you use. Also tell them if you smoke, drink alcohol, or use illegal drugs. Some items may interact with your medicine. What should I watch for while using this medicine? Your condition will be monitored carefully while you are receiving this medicine. You will need important blood work done while you are taking  this medicine. Do not become pregnant while taking this medicine or for at least 1 year after stopping it. Women of child-bearing potential will need to have a negative pregnancy test before starting this medicine. Women should inform their doctor if they wish to become pregnant or think they might be pregnant. There is a potential for serious side effects to an unborn child. Men should inform their doctors if they wish to father a child. This medicine may lower sperm counts. Talk to your health care professional or pharmacist for more information. Do not breast-feed an infant while taking this medicine or for 1 year after the last dose. What side effects may I notice from receiving this medicine? Side effects that you should report to your doctor or health care professional as soon as possible:  allergic reactions like skin rash, itching or hives, swelling of the face, lips, or tongue  feeling faint or lightheaded, falls  pain, tingling, numbness, or weakness in the legs  signs and symptoms of infection like fever or chills; cough; flu-like symptoms; sore throat  vaginal bleeding Side effects that usually do not require medical attention (report to your doctor or health care professional if they continue or are bothersome):  aches, pains  constipation  diarrhea  headache  hot flashes  nausea, vomiting  pain at site where injected  stomach pain This list may not describe all possible side effects. Call your doctor for medical advice about side effects. You may report side effects to FDA at 1-800-FDA-1088. Where should I keep my medicine? This drug is given in a hospital or clinic and will  not be stored at home. NOTE: This sheet is a summary. It may not cover all possible information. If you have questions about this medicine, talk to your doctor, pharmacist, or health care provider.  2020 Elsevier/Gold Standard (2017-08-05 11:34:41) Denosumab injection What is this  medicine? DENOSUMAB (den oh sue mab) slows bone breakdown. Prolia is used to treat osteoporosis in women after menopause and in men, and in people who are taking corticosteroids for 6 months or more. Delton See is used to treat a high calcium level due to cancer and to prevent bone fractures and other bone problems caused by multiple myeloma or cancer bone metastases. Delton See is also used to treat giant cell tumor of the bone. This medicine may be used for other purposes; ask your health care provider or pharmacist if you have questions. COMMON BRAND NAME(S): Prolia, XGEVA What should I tell my health care provider before I take this medicine? They need to know if you have any of these conditions:  dental disease  having surgery or tooth extraction  infection  kidney disease  low levels of calcium or Vitamin D in the blood  malnutrition  on hemodialysis  skin conditions or sensitivity  thyroid or parathyroid disease  an unusual reaction to denosumab, other medicines, foods, dyes, or preservatives  pregnant or trying to get pregnant  breast-feeding How should I use this medicine? This medicine is for injection under the skin. It is given by a health care professional in a hospital or clinic setting. A special MedGuide will be given to you before each treatment. Be sure to read this information carefully each time. For Prolia, talk to your pediatrician regarding the use of this medicine in children. Special care may be needed. For Delton See, talk to your pediatrician regarding the use of this medicine in children. While this drug may be prescribed for children as young as 13 years for selected conditions, precautions do apply. Overdosage: If you think you have taken too much of this medicine contact a poison control center or emergency room at once. NOTE: This medicine is only for you. Do not share this medicine with others. What if I miss a dose? It is important not to miss your dose. Call  your doctor or health care professional if you are unable to keep an appointment. What may interact with this medicine? Do not take this medicine with any of the following medications:  other medicines containing denosumab This medicine may also interact with the following medications:  medicines that lower your chance of fighting infection  steroid medicines like prednisone or cortisone This list may not describe all possible interactions. Give your health care provider a list of all the medicines, herbs, non-prescription drugs, or dietary supplements you use. Also tell them if you smoke, drink alcohol, or use illegal drugs. Some items may interact with your medicine. What should I watch for while using this medicine? Visit your doctor or health care professional for regular checks on your progress. Your doctor or health care professional may order blood tests and other tests to see how you are doing. Call your doctor or health care professional for advice if you get a fever, chills or sore throat, or other symptoms of a cold or flu. Do not treat yourself. This drug may decrease your body's ability to fight infection. Try to avoid being around people who are sick. You should make sure you get enough calcium and vitamin D while you are taking this medicine, unless your doctor tells  you not to. Discuss the foods you eat and the vitamins you take with your health care professional. See your dentist regularly. Brush and floss your teeth as directed. Before you have any dental work done, tell your dentist you are receiving this medicine. Do not become pregnant while taking this medicine or for 5 months after stopping it. Talk with your doctor or health care professional about your birth control options while taking this medicine. Women should inform their doctor if they wish to become pregnant or think they might be pregnant. There is a potential for serious side effects to an unborn child. Talk to your  health care professional or pharmacist for more information. What side effects may I notice from receiving this medicine? Side effects that you should report to your doctor or health care professional as soon as possible:  allergic reactions like skin rash, itching or hives, swelling of the face, lips, or tongue  bone pain  breathing problems  dizziness  jaw pain, especially after dental work  redness, blistering, peeling of the skin  signs and symptoms of infection like fever or chills; cough; sore throat; pain or trouble passing urine  signs of low calcium like fast heartbeat, muscle cramps or muscle pain; pain, tingling, numbness in the hands or feet; seizures  unusual bleeding or bruising  unusually weak or tired Side effects that usually do not require medical attention (report to your doctor or health care professional if they continue or are bothersome):  constipation  diarrhea  headache  joint pain  loss of appetite  muscle pain  runny nose  tiredness  upset stomach This list may not describe all possible side effects. Call your doctor for medical advice about side effects. You may report side effects to FDA at 1-800-FDA-1088. Where should I keep my medicine? This medicine is only given in a clinic, doctor's office, or other health care setting and will not be stored at home. NOTE: This sheet is a summary. It may not cover all possible information. If you have questions about this medicine, talk to your doctor, pharmacist, or health care provider.  2020 Elsevier/Gold Standard (2017-09-03 16:10:44)  

## 2020-03-01 ENCOUNTER — Telehealth: Payer: Self-pay | Admitting: Oncology

## 2020-03-01 LAB — CANCER ANTIGEN 27.29: CA 27.29: 49.2 U/mL — ABNORMAL HIGH (ref 0.0–38.6)

## 2020-03-01 NOTE — Telephone Encounter (Signed)
No 10/21 los, no changes made to pt schedule

## 2020-03-04 MED FILL — METFORMIN HCL ER 500 MG TB2: 500 | 30 days supply | Qty: 120 | Fill #0

## 2020-03-04 MED FILL — SERTRALINE HCL 100 MG TABS: 100 | 30 days supply | Qty: 30 | Fill #7

## 2020-03-12 ENCOUNTER — Other Ambulatory Visit: Payer: Self-pay | Admitting: Adult Health

## 2020-03-13 ENCOUNTER — Other Ambulatory Visit: Payer: Self-pay | Admitting: Adult Health

## 2020-03-13 MED FILL — FREESTYLE LITE TEST STRIP: 25 days supply | Qty: 100 | Fill #0

## 2020-03-19 MED FILL — PIQRAY 300MG DAILY DOSE 2X1: 2 X 150 | 28 days supply | Qty: 56 | Fill #0

## 2020-03-19 MED FILL — ANASTROZOLE 1 MG TABLET: 1 | 90 days supply | Qty: 90 | Fill #3

## 2020-03-21 ENCOUNTER — Other Ambulatory Visit: Payer: Self-pay | Admitting: *Deleted

## 2020-03-22 ENCOUNTER — Other Ambulatory Visit: Payer: Self-pay | Admitting: Pharmacist

## 2020-03-22 DIAGNOSIS — Z170 Estrogen receptor positive status [ER+]: Secondary | ICD-10-CM

## 2020-03-22 DIAGNOSIS — C50112 Malignant neoplasm of central portion of left female breast: Secondary | ICD-10-CM

## 2020-03-22 MED ORDER — PIQRAY (300 MG DAILY DOSE) 2 X 150 MG PO TBPK: ORAL_TABLET | ORAL | 6 refills | Status: DC

## 2020-03-22 NOTE — Progress Notes (Signed)
Oral Oncology Pharmacist Encounter  Prescription refill for Piqray (alpelisib) updated to reflect patient's current dose of 150 mg by mouth once daily. Prescription redirected to [facility].  Leron Croak, PharmD, BCPS Hematology/Oncology Clinical Pharmacist [facility] [phone] 03/22/2020 7:35 AM

## 2020-03-25 ENCOUNTER — Telehealth: Payer: Self-pay | Admitting: Adult Health

## 2020-03-25 NOTE — Telephone Encounter (Signed)
Rescheduled patients appointment time due to provider pal, patient has been called and notified of upcoming appointments.

## 2020-03-28 ENCOUNTER — Inpatient Hospital Stay: Payer: 59

## 2020-03-28 ENCOUNTER — Inpatient Hospital Stay: Payer: 59 | Attending: Oncology

## 2020-03-28 ENCOUNTER — Inpatient Hospital Stay (HOSPITAL_BASED_OUTPATIENT_CLINIC_OR_DEPARTMENT_OTHER): Payer: 59 | Admitting: Adult Health

## 2020-03-28 ENCOUNTER — Other Ambulatory Visit: Payer: 59

## 2020-03-28 ENCOUNTER — Other Ambulatory Visit: Payer: Self-pay

## 2020-03-28 ENCOUNTER — Ambulatory Visit: Payer: 59

## 2020-03-28 ENCOUNTER — Ambulatory Visit: Payer: 59 | Admitting: Medical

## 2020-03-28 VITALS — BP 140/78 | HR 88 | Temp 97.7°F | Resp 18 | Ht 66.5 in | Wt 259.1 lb

## 2020-03-28 DIAGNOSIS — Z170 Estrogen receptor positive status [ER+]: Secondary | ICD-10-CM | POA: Insufficient documentation

## 2020-03-28 DIAGNOSIS — Z79899 Other long term (current) drug therapy: Secondary | ICD-10-CM | POA: Diagnosis not present

## 2020-03-28 DIAGNOSIS — C787 Secondary malignant neoplasm of liver and intrahepatic bile duct: Secondary | ICD-10-CM | POA: Insufficient documentation

## 2020-03-28 DIAGNOSIS — C50112 Malignant neoplasm of central portion of left female breast: Secondary | ICD-10-CM

## 2020-03-28 DIAGNOSIS — Z923 Personal history of irradiation: Secondary | ICD-10-CM | POA: Diagnosis not present

## 2020-03-28 DIAGNOSIS — C50012 Malignant neoplasm of nipple and areola, left female breast: Secondary | ICD-10-CM

## 2020-03-28 DIAGNOSIS — C50911 Malignant neoplasm of unspecified site of right female breast: Secondary | ICD-10-CM | POA: Diagnosis not present

## 2020-03-28 DIAGNOSIS — C50211 Malignant neoplasm of upper-inner quadrant of right female breast: Secondary | ICD-10-CM | POA: Diagnosis not present

## 2020-03-28 DIAGNOSIS — Z5111 Encounter for antineoplastic chemotherapy: Secondary | ICD-10-CM | POA: Diagnosis not present

## 2020-03-28 DIAGNOSIS — C7951 Secondary malignant neoplasm of bone: Secondary | ICD-10-CM | POA: Diagnosis not present

## 2020-03-28 DIAGNOSIS — Z9013 Acquired absence of bilateral breasts and nipples: Secondary | ICD-10-CM | POA: Diagnosis not present

## 2020-03-28 DIAGNOSIS — C50912 Malignant neoplasm of unspecified site of left female breast: Secondary | ICD-10-CM

## 2020-03-28 DIAGNOSIS — C50011 Malignant neoplasm of nipple and areola, right female breast: Secondary | ICD-10-CM

## 2020-03-28 LAB — CBC WITH DIFFERENTIAL/PLATELET
Abs Immature Granulocytes: 0.05 10*3/uL (ref 0.00–0.07)
Basophils Absolute: 0 10*3/uL (ref 0.0–0.1)
Basophils Relative: 1 %
Eosinophils Absolute: 0.2 10*3/uL (ref 0.0–0.5)
Eosinophils Relative: 3 %
HCT: 39.2 % (ref 36.0–46.0)
Hemoglobin: 12.6 g/dL (ref 12.0–15.0)
Immature Granulocytes: 1 %
Lymphocytes Relative: 13 %
Lymphs Abs: 0.9 10*3/uL (ref 0.7–4.0)
MCH: 27.6 pg (ref 26.0–34.0)
MCHC: 32.1 g/dL (ref 30.0–36.0)
MCV: 85.8 fL (ref 80.0–100.0)
Monocytes Absolute: 0.5 10*3/uL (ref 0.1–1.0)
Monocytes Relative: 7 %
Neutro Abs: 5.3 10*3/uL (ref 1.7–7.7)
Neutrophils Relative %: 75 %
Platelets: 304 10*3/uL (ref 150–400)
RBC: 4.57 MIL/uL (ref 3.87–5.11)
RDW: 18.8 % — ABNORMAL HIGH (ref 11.5–15.5)
WBC: 6.9 10*3/uL (ref 4.0–10.5)
nRBC: 0 % (ref 0.0–0.2)

## 2020-03-28 LAB — COMPREHENSIVE METABOLIC PANEL
ALT: 12 U/L (ref 0–44)
AST: 11 U/L — ABNORMAL LOW (ref 15–41)
Albumin: 3.5 g/dL (ref 3.5–5.0)
Alkaline Phosphatase: 170 U/L — ABNORMAL HIGH (ref 38–126)
Anion gap: 9 (ref 5–15)
BUN: 8 mg/dL (ref 6–20)
CO2: 32 mmol/L (ref 22–32)
Calcium: 9.8 mg/dL (ref 8.9–10.3)
Chloride: 102 mmol/L (ref 98–111)
Creatinine, Ser: 0.95 mg/dL (ref 0.44–1.00)
GFR, Estimated: >60 mL/min (ref >60)
Glucose, Bld: 152 mg/dL — ABNORMAL HIGH (ref 70–99)
Potassium: 3.3 mmol/L — ABNORMAL LOW (ref 3.5–5.1)
Sodium: 143 mmol/L (ref 135–145)
Total Bilirubin: 0.3 mg/dL (ref 0.3–1.2)
Total Protein: 7.5 g/dL (ref 6.5–8.1)

## 2020-03-28 MED ORDER — FULVESTRANT 250 MG/5ML IM SOLN
INTRAMUSCULAR | Status: AC
  Filled 2020-03-28: qty 10

## 2020-03-28 MED ORDER — DENOSUMAB 120 MG/1.7ML ~~LOC~~ SOLN
120.0000 mg | Freq: Once | SUBCUTANEOUS | Status: AC
  Administered 2020-03-28: 120 mg via SUBCUTANEOUS

## 2020-03-28 MED ORDER — DENOSUMAB 120 MG/1.7ML ~~LOC~~ SOLN
SUBCUTANEOUS | Status: AC
  Filled 2020-03-28: qty 1.7

## 2020-03-28 MED ORDER — FULVESTRANT 250 MG/5ML IM SOLN
500.0000 mg | Freq: Once | INTRAMUSCULAR | Status: AC
  Administered 2020-03-28: 500 mg via INTRAMUSCULAR

## 2020-03-28 NOTE — Progress Notes (Signed)
[place]  [phone] [phone]) [phone]    ID: Debbie [name] DOB: 16-Aug-1965  MR#: [id number]  EVO#:[id number]  Patient Care Team: Kelton Pillar, MD as PCP - General (Family Medicine) Jacelyn Pi, MD as Consulting Physician (Endocrinology) Jovita Kussmaul, MD as Consulting Physician (General Surgery) [name], Virgie Dad, MD as Consulting Physician (Oncology) [name], MD as Attending Physician (Radiation Oncology) Belva Crome, MD as Consulting Physician (Cardiology) Crissie Reese, MD as Consulting Physician (Plastic Surgery) [name], [name], NP as Nurse Practitioner (Hematology and Oncology) Lajoyce Lauber, DMD (Dentistry) OTHER MD:   CHIEF COMPLAINT: Estrogen receptor positive stage IV  breast cancer (s/p bilateral mastectomies)  CURRENT TREATMENT: Fulvestrant; Alepelisib; denosumab/Xgeva   INTERVAL HISTORY: Debbie [name] returns today for follow-up and treatment of her estrogen receptor positive stage IV breast cancer.  She is receiving fulvestrant every 4 weeks.  She tolerates this well.   She continues on Xgeva every 12 weeks, with her most recent dose on 01/24/2020.  She has no side effects from this that she is aware of  She was restarted on alpelisib at 150 mg on 02/13/2020.  Aside from problems with the blood sugar she is tolerating it well.  Debbie [name] is taking metformin BID and tolerates it well.  Her sugars range from 75-120.    REVIEW OF SYSTEMS: Debbie [name] is working full time, and is working some overtime as well.   She has some mild nausea from time to time.  She denies any weight loss, new pain, fever, chills, chest pain, cough, shortness of breath, or any other concerns.  A detailed ROS was otherwise non contribuotry.       BREAST CANCER HISTORY: From the original intake note:  Annalena had screening mammography with tomosynthesis at the [facility] 10/02/2015. This showed 2 possible masses in the right breast  as well as some suspicious calcifications. The left breast was unremarkable. On 10/15/2015 she underwent right diagnostic mammography with tomography and right breast ultrasonography. The breast density was category B. Spot magnification views confirmed an area of pleomorphic calcifications measuring up to 5 cm in the upper half of the breast. Also in the upper outer right breast there were adjacent low-density masses without distortion or calcifications. On physical exam there was no palpable abnormality.  Targeted ultrasound of the right breast found the 2 "masses" in the right upper quadrant were benign cysts measuring 0.8 and 0.5 cm respectively. Biopsy of the area of calcification on 10/15/2015 showed ([id number]) invasive ductal carcinoma, E-cadherin positive, grade 2, estrogen receptor 90% positive, progesterone receptor 50% positive, both with strong staining intensity, with an MIB-1 of 70%, and no HER-2 amplification, the signals ratio being 1.18 and the number per cell 2.60.  Her subsequent history is as detailed below    PAST MEDICAL HISTORY: Past Medical History:  Diagnosis Date  . Anxiety   . Arthritis   . Bone metastasis ([place])   . Breast cancer of upper-inner quadrant of right female breast [facility]) oncologist-  dr [name]--- 04/ 2019 ER/PR+ Stage IV w/ metastatic disease to liver, total spine, lung, bone   dx 06/ 2017  right breast invasive ductal carcinoma, Stage IIB, ER/PR + (pT2 pN1), grade 1-- s/p  right breast lumpecotmy w/ sln bx's and bilateral breast reduction 11-28-2015/  left breast with reduction , dx invasive ductal ca, Grade 1 (pT1cpNX) ER/PR positive/  adjuvant radiation completed 03-27-2016 to right chest wall, 2018  reclassifiec Stage IIA  . Cancer, metastatic to liver ([place])   .  Chronic back pain   . Constipation   . Depression   . Family history of adverse reaction to anesthesia     mother has Copd was kept on vent   . Headache   . Heart murmur   . History of  hyperthyroidism    s/p  RAI  . History of radiation therapy 02/11/16- 03/27/16   Right Chest Wall 50.4 Gy in 28 fractions, Right Chest Wall Boost 10 Gy in 5 fractions.   . Hyperlipidemia   . Hypertension   . Hypothyroidism, postradioiodine therapy    endocrinoloigst-  dr balan--  2004  s/p  RAI  . Menorrhagia 2011  . Multiple pulmonary nodules    secondary  metastatic  . PONV (postoperative nausea and vomiting)    patch helps   . SUI (stress urinary incontinence, female)   . Wears contact lenses   . Wears dentures    full upper and lower partial    PAST SURGICAL HISTORY: Past Surgical History:  Procedure Laterality Date  . BREAST LUMPECTOMY WITH NEEDLE LOCALIZATION AND AXILLARY SENTINEL LYMPH NODE BX Right 11/28/2015   Procedure: RIGHT BREAST LUMPECTOMY WITH NEEDLE LOCALIZATION AND AXILLARY SENTINEL LYMPH NODE BX;  Surgeon: Autumn Messing III, MD;  Location: [place];  Service: General;  Laterality: Right;  . BREAST REDUCTION SURGERY Bilateral 11/28/2015   Procedure: MAMMARY REDUCTION  (BREAST) BILATERAL;  Surgeon: Crissie Reese, MD;  Location: [place];  Service: Plastics;  Laterality: Bilateral;  . CESAREAN SECTION  01/12/1993  . COLONOSCOPY    . Dental surgeries    . FEMUR IM NAIL Left 01/28/2018   Procedure: INTRAMEDULLARY (IM) NAIL FEMORAL;  Surgeon: Nicholes Stairs, MD;  Location: [place];  Service: Orthopedics;  Laterality: Left;  . HYSTEROSCOPY N/A 09/02/2017   Procedure: HYSTEROSCOPY  to remove IUD;  Surgeon: Eldred Manges, MD;  Location[facility];  Service: Gynecology;  Laterality: N/A;  . Liver biospy    . MASTECTOMY    . MASTECTOMY W/ SENTINEL NODE BIOPSY Bilateral 12/26/2015   Procedure: BILATERAL MASTECTOMY WITH LEFT SENTINEL LYMPH NODE BIOPSY;  Surgeon: Autumn Messing III, MD;  Location: [place];  Service: General;  Laterality: Bilateral;  . Removal of IUD    . TUBAL LIGATION Bilateral 1995    FAMILY HISTORY: Family History  Problem Relation Age of Onset    . Diabetes Mother   . Hypertension Mother   . Hypertension Father   . Diabetes Maternal Grandmother   . Hypertension Sister   . Cancer Paternal Grandmother        colon  . Colon cancer Paternal Grandmother   . Esophageal cancer Neg Hx   . Stomach cancer Neg Hx   . Rectal cancer Neg Hx    The patient's parents are still living, in their early 63s as of June 2017. The patient has one brother, 2 sisters. On the maternal side there is a history of uterine and prostate cancer. On the paternal side there is a history of colon cancer and possibly uterine cancer. There is no history of breast or ovarian cancer in the family.    GYNECOLOGIC HISTORY:  No LMP recorded. (Menstrual status: IUD).  Menarche age 58. She still having regular periods. She is GX P1, first pregnancy age 11. She used oral contraceptives for a few years remotely, with no complications. She is currently on a Mirena IUD and also is status post bilateral tubal ligation.   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 Her husband [name] is a Administrator.  They are both taking appropriate precautions at work.  Their son [name], age 6, is a radio DJ.  He no longer lives with the patient.  The patient has no grandchildren. She is not a Ambulance person.   ADVANCED DIRECTIVES: In the absence of any documents to the contrary her husband is her healthcare power of attorney   HEALTH MAINTENANCE: Social History   Tobacco Use  . Smoking status: Never Smoker  . Smokeless tobacco: Never Used  Vaping Use  . Vaping Use: Never used  Substance Use Topics  . Alcohol use: No  . Drug use: No     Colonoscopy: January 2016   PAP: January 2016   Bone density: August 2017 showed a T score of +0.7 normal  Lipid panel:  Allergies  Allergen Reactions  . Ace Inhibitors Cough  . Atorvastatin Other (See Comments)    Joint pain  . Simvastatin Other (See Comments)    Joint pain     Current Outpatient Medications  Medication Sig Dispense Refill  . alpelisib (PIQRAY, 300 MG DAILY DOSE,) 2 x 150 MG Therapy Pack Take 1 tablet ($RemoveB'150mg'lheRQvrx$  total) by mouth daily with food. Swallow whole, do not crush, chew, or split. 56 each 6  . blood glucose meter kit and supplies KIT Dispense based on patient and insurance preference. Use up to four times daily as directed. (FOR ICD-9 250.00, 250.01). 1 each 0  . Calcium-Magnesium 250-125 MG TABS Take 1 tablet by mouth daily. 120 each 4  . cetirizine (ZYRTEC ALLERGY) 10 MG tablet Take 10 mg by mouth daily as needed for allergies.     . chlorhexidine (PERIDEX) 0.12 % solution     . Ferrous Sulfate (IRON) 325 (65 Fe) MG TABS Take 1 tablet (325 mg total) by mouth daily. 30 each 2  . fluconazole (DIFLUCAN) 100 MG tablet 2 tablets first dose then 1 tab daily 8 tablet 0  . FREESTYLE LITE test strip USE AS DIRECTED TO TEST UP TO FOUR TIMES DAILY 100 strip 0  . furosemide (LASIX) 20 MG tablet Take 20 mg by mouth daily as needed.    Marland Kitchen ibuprofen (ADVIL) 800 MG tablet Take 800 mg by mouth every 6 (six) hours as needed.    Marland Kitchen ibuprofen (ADVIL,MOTRIN) 600 MG tablet Take 1 tablet (600 mg total) by mouth every 6 (six) hours as needed for moderate pain. 60 tablet 0  . Lancets (ONETOUCH ULTRASOFT) lancets Use as instructed 100 each 12  . levothyroxine (SYNTHROID, LEVOTHROID) 137 MCG tablet Take 137 mcg by mouth daily before breakfast.     . LORazepam (ATIVAN) 0.5 MG tablet Take 1 tablet (0.5 mg total) by mouth at bedtime as needed for anxiety. 30 tablet 0  . losartan (COZAAR) 100 MG tablet Take 100 mg by mouth every morning.     Marland Kitchen losartan-hydrochlorothiazide (HYZAAR) 100-25 MG tablet Take 1 tablet by mouth daily.    . metFORMIN (GLUCOPHAGE XR) 500 MG 24 hr tablet Take 2 tablets (1,000 mg total) by mouth in the morning and at bedtime. 120 tablet 4  . Multiple Vitamin (MULTIVITAMIN) tablet Take 1 tablet by mouth daily.    Marland Kitchen omega-3 acid ethyl esters (LOVAZA) 1 g  capsule Take 1 g by mouth 2 (two) times daily.     . potassium chloride SA (K-DUR,KLOR-CON) 20 MEQ tablet Take 40 mEq by mouth daily.     . pregabalin (LYRICA) 100 MG  capsule TAKE 1 CAPSULE BY MOUTH THREE TIMES DAILY 90 capsule 0  . prochlorperazine (COMPAZINE) 10 MG tablet Take 1 tablet (10 mg total) by mouth every 6 (six) hours as needed for nausea or vomiting. 30 tablet 0  . rosuvastatin (CRESTOR) 5 MG tablet Take 1 tablet (5 mg total) by mouth daily.    . sertraline (ZOLOFT) 100 MG tablet Take 1 tablet (100 mg total) by mouth daily.    . traMADol (ULTRAM) 50 MG tablet Take 50 mg by mouth every 6 (six) hours as needed.    . triamterene-hydrochlorothiazide (DYAZIDE) 37.5-25 MG capsule TAKE 1 CAPSULE BY MOUTH ONCE DAILY 90 capsule 0  . Vitamin D, Cholecalciferol, 1000 units CAPS Take 1 tablet by mouth daily. (Patient taking differently: Take 1,000 Units by mouth daily. ) 90 capsule 4   No current facility-administered medications for this visit.    OBJECTIVE: African-American [name] who appears stated age  54:   03/28/20 1420  BP: 140/78  Pulse: 88  Resp: 18  Temp: 97.7 F (36.5 C)  SpO2: 93%     Body mass index is 41.19 kg/m.    ECOG FS:1 - Symptomatic but completely ambulatory  Filed Weights   03/28/20 1420  Weight: 259 lb 1.6 oz (117.5 kg)   GENERAL: Patient is a well appearing female in no acute distress HEENT:  Sclerae anicteric.  Neck is supple.  NODES:  No cervical, supraclavicular, or axillary lymphadenopathy palpated.  BREAST EXAM:  Deferred. LUNGS:  Clear to auscultation bilaterally.  No wheezes or rhonchi. HEART:  Regular rate and rhythm. No murmur appreciated. ABDOMEN:  Soft, nontender.  Positive, normoactive bowel sounds. No organomegaly palpated. MSK:  No focal spinal tenderness to palpation. Full range of motion bilaterally in the upper extremities. EXTREMITIES:  No peripheral edema.   SKIN:  Clear with no obvious rashes or skin changes. No nail  dyscrasia. NEURO:  Nonfocal. Well oriented.  Appropriate affect.    LAB RESULTS:  CMP     Component Value Date/Time   NA 143 03/28/2020 1322   NA 140 07/08/2016 0948   K 3.3 (L) 03/28/2020 1322   K 4.1 07/08/2016 0948   CL 102 03/28/2020 1322   CO2 32 03/28/2020 1322   CO2 24 07/08/2016 0948   GLUCOSE 152 (H) 03/28/2020 1322   GLUCOSE 89 07/08/2016 0948   BUN 8 03/28/2020 1322   BUN 11.9 07/08/2016 0948   CREATININE 0.95 03/28/2020 1322   CREATININE 0.77 05/16/2019 0905   CREATININE 0.9 07/08/2016 0948   CALCIUM 9.8 03/28/2020 1322   CALCIUM 10.0 07/08/2016 0948   PROT 7.5 03/28/2020 1322   PROT 7.6 07/08/2016 0948   ALBUMIN 3.5 03/28/2020 1322   ALBUMIN 3.6 07/08/2016 0948   AST 11 (L) 03/28/2020 1322   AST 35 05/16/2019 0905   AST 22 07/08/2016 0948   ALT 12 03/28/2020 1322   ALT 40 05/16/2019 0905   ALT 27 07/08/2016 0948   ALKPHOS 170 (H) 03/28/2020 1322   ALKPHOS 115 07/08/2016 0948   BILITOT 0.3 03/28/2020 1322   BILITOT 0.7 05/16/2019 0905   BILITOT 0.35 07/08/2016 0948   GFRNONAA >60 03/28/2020 1322   GFRNONAA >60 05/16/2019 0905   GFRAA >60 01/30/2020 0814   GFRAA >60 05/16/2019 0905    INo results found for: SPEP, UPEP  Lab Results  Component Value Date   WBC 6.9 03/28/2020   NEUTROABS 5.3 03/28/2020   HGB 12.6 03/28/2020   HCT 39.2 03/28/2020   MCV 85.8  03/28/2020   PLT 304 03/28/2020      Chemistry      Component Value Date/Time   NA 143 03/28/2020 1322   NA 140 07/08/2016 0948   K 3.3 (L) 03/28/2020 1322   K 4.1 07/08/2016 0948   CL 102 03/28/2020 1322   CO2 32 03/28/2020 1322   CO2 24 07/08/2016 0948   BUN 8 03/28/2020 1322   BUN 11.9 07/08/2016 0948   CREATININE 0.95 03/28/2020 1322   CREATININE 0.77 05/16/2019 0905   CREATININE 0.9 07/08/2016 0948      Component Value Date/Time   CALCIUM 9.8 03/28/2020 1322   CALCIUM 10.0 07/08/2016 0948   ALKPHOS 170 (H) 03/28/2020 1322   ALKPHOS 115 07/08/2016 0948   AST 11 (L)  03/28/2020 1322   AST 35 05/16/2019 0905   AST 22 07/08/2016 0948   ALT 12 03/28/2020 1322   ALT 40 05/16/2019 0905   ALT 27 07/08/2016 0948   BILITOT 0.3 03/28/2020 1322   BILITOT 0.7 05/16/2019 0905   BILITOT 0.35 07/08/2016 0948       No results found for: LABCA2  No components found for: LABCA125  No results for input(s): INR in the last 168 hours.  Urinalysis No results found for: COLORURINE, APPEARANCEUR, LABSPEC, PHURINE, GLUCOSEU, HGBUR, BILIRUBINUR, KETONESUR, PROTEINUR, UROBILINOGEN, NITRITE, LEUKOCYTESUR   STUDIES: No results found.   ELIGIBLE FOR AVAILABLE RESEARCH PROTOCOL: PALLAS, but inelegible because of Mirena IUD   ASSESSMENT: 54 y.o. Debbie [name] status post right breast upper inner quadrant biopsy 10/15/2015 for a clinical T2-T3 N0, stage II invasive ductal carcinoma, estrogen and progesterone receptor positive, HER-2 nonamplified, with an Mib-1 of 70%  (1) status post right lumpectomy and sentinel lymph node sampling with oncoplastic breast reduction 11/28/2015 for a pT2 pN1, stage IIB invasive ductal carcinoma, grade 1, with negative margins (1/1 sentinel node positive)  (a) reclassified as stage IIA in the 2018 prognostic revision   (2) left reduction mammoplasty 11/28/2015 unexpectedly found a pT1c pNX invasive ductal carcinoma, grade 1, estrogen and progesterone receptor positive, HER-2 not amplified, with an MIB-1 of 3%  (3) Mammaprint from the right sided breast cancer was "low risk", predicting a 98% chance of no disease recurrence within 5 years with anti-estrogens as the only systemic therapy. It also predicts minimal to no benefit from chemotherapy  (4) status post bilateral mastectomies with bilateral sentinel lymph node sampling 12/26/2015, showing  (a) on the right, no residual carcinoma (0/3 nodes involved)  (b) on the left, focal ductal carcinoma in situ, 0.2 cm, with negative margins; (0/4 nodes involved)  (5) adjuvant radiation  02/11/16 - 03/27/16    1) Right chest wall: 50.4 Gy in 28 fractions               2) Right chest wall boost: 10 Gy in 5 fractions   (6) started on tamoxifen neoadjuvantly 10/23/2015 to allow for expected delays in definitive local treatment  (a) Mirena IUD in place  (b) tamoxifen discontinued 08/13/2017, with progression  (7) since she had 4 lymph nodes removed from each axilla but also receive radiation on the right, lab draws should preferentially be obtained from the left arm   METASTATIC DISEASE: April 2019 (8) CT scan of the abdomen and pelvis 08/04/2017, MRI of the liver and total spine MRI 08/11/2017 and CT scan of the chest and brain on 08/13/2017 showed liver and bone metastases  (a) liver biopsy 09/07/2017 confirms estrogen receptor positive, progesterone receptor negative, HER-2 negative metastatic  breast cancer  (b) chest CT scan 08/13/2017 shows multiple bilateral pulmonary nodules.  (c) CT of the brain with and without contrast 08/26/2017 shows no evidence of metastatic disease to the brain  (d) CA 27-29 is informative  (9) fulvestrant started 08/17/2017  (a) palbociclib started 08/31/2017  (b) fulvestrant and palbociclib discontinued 01/04/2018 with evidence of progression in the liver  (10) denosumab/Xgeva started 09/14/2017  (a) held after 03/01/2018 dose because concern re. osteonecrosis  (b) resumed 06/09/2018  (c) 02/21/2019 dose held due to oral surgery, resumed 03/21/2019  (11) anastrozole started 01/04/2018  (a) everolimus started 01/29/2018  (b) liver MRI and bone scan December 2019 show stable disease  (c) chest CT scan on 08/02/2018 shows lung lesions resolved, bone lesions stable  (d) bone scan 09/13/2018 essentially stable  (e) liver MRI 12/08/2018 shows improvement in hepatic metastsases  (f) liver MRI and bone scan 04/13/2019 showed continuing improvement in the liver, stable bone lesions  (g) chest CT 08/01/2019 stable, no evidence of active disease  ows  (12) 01/28/18 prophylactic left femur intramedullary nail surgery for impending left femur pathologic fracture  (a) palliative radiation with Dr. [name] to the left femur and left ilium from 02/16/2018 to 03/01/2018  (13) [facility] report on liver biopsy from April 2019 shows a negative PDL 1, stable MSI and proficient mismatch repair status.  The tumor is positive for the androgen receptor, and for PTEN for PIK3 CA mutations.  HER-2/neu was read as equivocal (it was negative by FISH on the pathology report).  (14) Progression noted in liver MRI on 01/11/2020; fulvestrant started on 01/30/2020  (a) alpelisib is started 01/30/2020 at 300 mg daily with Metformin 500 mg daily  (b) alpelisib held on 02/07/2020 with very high blood sugars.  Metformin increased to 1 g daily  (c) alpelisib resumed at 150 mg daily on 02/13/2020   PLAN: Emersyn continues on treatment with Fulvestrant and alpelisib; Xgeva.  She tolerates this well and will continue it as ordered.    She is managing her nausea pretty well with using a mints or hard candies to help with the nausea, she will continue this.    Her sugars are more controlled than prior with the metformin and decreased alepelisb dose.  She will continue monitoring this and will let us know if it increases.    I ordered a liver MRI to be completed towards the end of December for restaging.    She knows to call for any questions that may arise between now and her next appointment.  We are happy to see her sooner if needed.   Total encounter time 20 minutes.Wilber Bihari, NP 03/28/20 2:40 PM Medical Oncology [facility] [place], [place] [address] Tel. [phone]    Fax. [phone]   *Total Encounter Time as defined by the Centers for Medicare and Medicaid Services includes, in addition to the face-to-face time of a patient visit (documented in the note above) non-face-to-face time: obtaining and  reviewing outside history, ordering and reviewing medications, tests or procedures, care coordination (communications with other health care professionals or caregivers) and documentation in the medical record.

## 2020-03-29 ENCOUNTER — Telehealth: Payer: Self-pay | Admitting: Adult Health

## 2020-03-29 LAB — CANCER ANTIGEN 27.29: CA 27.29: 48.6 U/mL — ABNORMAL HIGH (ref 0.0–38.6)

## 2020-03-29 NOTE — Telephone Encounter (Signed)
No 11/18 los. No changes made to pt's schedule.   

## 2020-04-02 ENCOUNTER — Ambulatory Visit: Payer: 59

## 2020-04-02 ENCOUNTER — Other Ambulatory Visit: Payer: 59

## 2020-04-10 ENCOUNTER — Other Ambulatory Visit: Payer: Self-pay | Admitting: Oncology

## 2020-04-10 ENCOUNTER — Other Ambulatory Visit: Payer: Self-pay | Admitting: *Deleted

## 2020-04-10 MED ORDER — PREGABALIN 100 MG PO CAPS
100.0000 mg | ORAL_CAPSULE | Freq: Three times a day (TID) | ORAL | 0 refills | Status: DC
Start: 2020-04-10 — End: 2020-05-21

## 2020-04-10 MED FILL — PREGABALIN 100 MG CAPS: 100 | 30 days supply | Qty: 90 | Fill #0

## 2020-04-10 MED FILL — SERTRALINE HCL 100 MG TABS: 100 | 30 days supply | Qty: 30 | Fill #8

## 2020-04-11 DIAGNOSIS — H524 Presbyopia: Secondary | ICD-10-CM | POA: Diagnosis not present

## 2020-04-16 MED FILL — METFORMIN HCL ER 500 MG TB2: 500 | 30 days supply | Qty: 120 | Fill #1

## 2020-04-16 MED FILL — ROSUVASTATIN CALCIUM 5 MG T: 5 | 90 days supply | Qty: 90 | Fill #0

## 2020-04-24 NOTE — Progress Notes (Addendum)
[place]  [phone] [phone]    ID: Debbie [name] DOB: 02-14-1966  MR#: [id number]  XTK#:[id number]  Patient Care Team: Kelton Pillar, MD as PCP - General (Family Medicine) Jacelyn Pi, MD as Consulting Physician (Endocrinology) Jovita Kussmaul, MD as Consulting Physician (General Surgery) Freada Twersky, Virgie Dad, MD as Consulting Physician (Oncology) [name], MD as Attending Physician (Radiation Oncology) Belva Crome, MD as Consulting Physician (Cardiology) Crissie Reese, MD as Consulting Physician (Plastic Surgery) [name], [name], NP as Nurse Practitioner (Hematology and Oncology) Lajoyce Lauber, DMD (Dentistry) OTHER MD:   CHIEF COMPLAINT: Estrogen receptor positive stage IV  breast cancer (s/p bilateral mastectomies)  CURRENT TREATMENT: Fulvestrant; Alepelisib; denosumab/Xgeva   INTERVAL HISTORY: Debbie [name] returns today for follow-up and treatment of her estrogen receptor positive stage IV breast cancer.  She is receiving fulvestrant every 4 weeks.  She tolerates this well.   She continues on Xgeva every 4 weeks, with her most recent dose on 03/28/2020.  She has no side effects from this that she is aware of  She was restarted on alpelisib at 150 mg on 02/13/2020.  Aside from problems with the blood sugar she is tolerating it well.  Debbie [name] is taking metformin BID and tolerates it well.  Her sugars range from 75-120.   She is scheduled for restaging liver MRI on 05/06/2020.   REVIEW OF SYSTEMS: Debbie [name] generally is doing "good".  She says she has a little bit less energy although she is working up to 16-hour days sometimes.  She has early satiety and has lost a few pounds.  She has minimal nausea, no vomiting.  Her skin is very dry.  She tells me her sugars generally well controlled, running in between 46 and 158 most recently.  The right upper upper extremity lymphedema is stable.  She is looking forward to  the holidays with family.  A detailed review systems today was otherwise stable   COVID 19 VACCINATION STATUS: Status post [place] x2, most recently January 2021   BREAST CANCER HISTORY: From the original intake note:  Debbie [name] had screening mammography with tomosynthesis at [facility] 10/02/2015. This showed 2 possible masses in the right breast as well as some suspicious calcifications. The left breast was unremarkable. On 10/15/2015 she underwent right diagnostic mammography with tomography and right breast ultrasonography. The breast density was category B. Spot magnification views confirmed an area of pleomorphic calcifications measuring up to 5 cm in the upper half of the breast. Also in the upper outer right breast there were adjacent low-density masses without distortion or calcifications. On physical exam there was no palpable abnormality.  Targeted ultrasound of the right breast found the 2 "masses" in the right upper quadrant were benign cysts measuring 0.8 and 0.5 cm respectively. Biopsy of the area of calcification on 10/15/2015 showed ([id number]) invasive ductal carcinoma, E-cadherin positive, grade 2, estrogen receptor 90% positive, progesterone receptor 50% positive, both with strong staining intensity, with an MIB-1 of 70%, and no HER-2 amplification, the signals ratio being 1.18 and the number per cell 2.60.  Her subsequent history is as detailed below    PAST MEDICAL HISTORY: Past Medical History:  Diagnosis Date  . Anxiety   . Arthritis   . Bone metastasis ([place])   . Breast cancer of upper-inner quadrant of right female breast [facility]) oncologist-  dr [name]--- 04/ 2019 ER/PR+ Stage IV w/ metastatic disease to liver, total spine, lung, bone   dx 06/ 2017  right breast invasive ductal carcinoma, Stage IIB, ER/PR + (pT2 pN1), grade 1-- s/p  right breast lumpecotmy w/ sln bx's and bilateral breast reduction 11-28-2015/  left breast with reduction , dx invasive ductal  ca, Grade 1 (pT1cpNX) ER/PR positive/  adjuvant radiation completed 03-27-2016 to right chest wall, 2018  reclassifiec Stage IIA  . Cancer, metastatic to liver ([place])   . Chronic back pain   . Constipation   . Depression   . Family history of adverse reaction to anesthesia     mother has Copd was kept on vent   . Headache   . Heart murmur   . History of hyperthyroidism    s/p  RAI  . History of radiation therapy 02/11/16- 03/27/16   Right Chest Wall 50.4 Gy in 28 fractions, Right Chest Wall Boost 10 Gy in 5 fractions.   . Hyperlipidemia   . Hypertension   . Hypothyroidism, postradioiodine therapy    endocrinoloigst-  dr balan--  2004  s/p  RAI  . Menorrhagia 2011  . Multiple pulmonary nodules    secondary  metastatic  . PONV (postoperative nausea and vomiting)    patch helps   . SUI (stress urinary incontinence, female)   . Wears contact lenses   . Wears dentures    full upper and lower partial    PAST SURGICAL HISTORY: Past Surgical History:  Procedure Laterality Date  . BREAST LUMPECTOMY WITH NEEDLE LOCALIZATION AND AXILLARY SENTINEL LYMPH NODE BX Right 11/28/2015   Procedure: RIGHT BREAST LUMPECTOMY WITH NEEDLE LOCALIZATION AND AXILLARY SENTINEL LYMPH NODE BX;  Surgeon: Autumn Messing III, MD;  Location: [place];  Service: General;  Laterality: Right;  . BREAST REDUCTION SURGERY Bilateral 11/28/2015   Procedure: MAMMARY REDUCTION  (BREAST) BILATERAL;  Surgeon: Crissie Reese, MD;  Location: [place];  Service: Plastics;  Laterality: Bilateral;  . CESAREAN SECTION  01/12/1993  . COLONOSCOPY    . Dental surgeries    . FEMUR IM NAIL Left 01/28/2018   Procedure: INTRAMEDULLARY (IM) NAIL FEMORAL;  Surgeon: Nicholes Stairs, MD;  Location: [place];  Service: Orthopedics;  Laterality: Left;  . HYSTEROSCOPY N/A 09/02/2017   Procedure: HYSTEROSCOPY  to remove IUD;  Surgeon: Eldred Manges, MD;  Location: [facility];  Service: Gynecology;  Laterality: N/A;  . Liver biospy     . MASTECTOMY    . MASTECTOMY W/ SENTINEL NODE BIOPSY Bilateral 12/26/2015   Procedure: BILATERAL MASTECTOMY WITH LEFT SENTINEL LYMPH NODE BIOPSY;  Surgeon: Autumn Messing III, MD;  Location: [place];  Service: General;  Laterality: Bilateral;  . Removal of IUD    . TUBAL LIGATION Bilateral 1995    FAMILY HISTORY: Family History  Problem Relation Age of Onset  . Diabetes Mother   . Hypertension Mother   . Hypertension Father   . Diabetes Maternal Grandmother   . Hypertension Sister   . Cancer Paternal Grandmother        colon  . Colon cancer Paternal Grandmother   . Esophageal cancer Neg Hx   . Stomach cancer Neg Hx   . Rectal cancer Neg Hx    The patient's parents are still living, in their early 79s as of June 2017. The patient has one brother, 2 sisters. On the maternal side there is a history of uterine and prostate cancer. On the paternal side there is a history of colon cancer and possibly uterine cancer. There is no history of breast or ovarian cancer in the family.  GYNECOLOGIC HISTORY:  No LMP recorded. (Menstrual status: IUD).  Menarche age 27. She still having regular periods. She is GX P1, first pregnancy age 65. She used oral contraceptives for a few years remotely, with no complications. She is currently on a Mirena IUD and also is status post bilateral tubal ligation.   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 Her husband [name] is a Administrator.  They are both taking appropriate precautions at work.  Their son [name], age 29, is a radio DJ.  He no longer lives with the patient.  The patient has no grandchildren. She is not a Ambulance person.   ADVANCED DIRECTIVES: In the absence of any documents to the contrary her husband is her healthcare power of attorney   HEALTH MAINTENANCE: Social History   Tobacco Use  . Smoking status: Never Smoker  . Smokeless tobacco: Never Used  Vaping Use  . Vaping Use: Never used   Substance Use Topics  . Alcohol use: No  . Drug use: No     Colonoscopy: January 2016   PAP: January 2016   Bone density: August 2017 showed a T score of +0.7 normal  Lipid panel:  Allergies  Allergen Reactions  . Ace Inhibitors Cough  . Atorvastatin Other (See Comments)    Joint pain  . Simvastatin Other (See Comments)    Joint pain    Current Outpatient Medications  Medication Sig Dispense Refill  . alpelisib (PIQRAY, 300 MG DAILY DOSE,) 2 x 150 MG Therapy Pack Take 1 tablet (133m total) by mouth daily with food. Swallow whole, do not crush, chew, or split. 56 each 6  . blood glucose meter kit and supplies KIT Dispense based on patient and insurance preference. Use up to four times daily as directed. (FOR ICD-9 250.00, 250.01). 1 each 0  . Calcium-Magnesium 250-125 MG TABS Take 1 tablet by mouth daily. 120 each 4  . cetirizine (ZYRTEC ALLERGY) 10 MG tablet Take 10 mg by mouth daily as needed for allergies.     . chlorhexidine (PERIDEX) 0.12 % solution     . Ferrous Sulfate (IRON) 325 (65 Fe) MG TABS Take 1 tablet (325 mg total) by mouth daily. 30 each 2  . fluconazole (DIFLUCAN) 100 MG tablet 2 tablets first dose then 1 tab daily 8 tablet 0  . FREESTYLE LITE test strip USE AS DIRECTED TO TEST UP TO FOUR TIMES DAILY 100 strip 0  . furosemide (LASIX) 20 MG tablet Take 20 mg by mouth daily as needed.    .Marland Kitchenibuprofen (ADVIL) 800 MG tablet Take 800 mg by mouth every 6 (six) hours as needed.    .Marland Kitchenibuprofen (ADVIL,MOTRIN) 600 MG tablet Take 1 tablet (600 mg total) by mouth every 6 (six) hours as needed for moderate pain. 60 tablet 0  . Lancets (ONETOUCH ULTRASOFT) lancets Use as instructed 100 each 12  . levothyroxine (SYNTHROID, LEVOTHROID) 137 MCG tablet Take 137 mcg by mouth daily before breakfast.     . LORazepam (ATIVAN) 0.5 MG tablet Take 1 tablet (0.5 mg total) by mouth at bedtime as needed for anxiety. 30 tablet 0  . losartan (COZAAR) 100 MG tablet Take 100 mg by mouth every  morning.     .Marland Kitchenlosartan-hydrochlorothiazide (HYZAAR) 100-25 MG tablet Take 1 tablet by mouth daily.    . metFORMIN (GLUCOPHAGE XR) 500 MG 24 hr tablet Take 2 tablets (1,000 mg total) by mouth in the morning and at bedtime.  120 tablet 4  . Multiple Vitamin (MULTIVITAMIN) tablet Take 1 tablet by mouth daily.    Marland Kitchen omega-3 acid ethyl esters (LOVAZA) 1 g capsule Take 1 g by mouth 2 (two) times daily.     . potassium chloride SA (K-DUR,KLOR-CON) 20 MEQ tablet Take 40 mEq by mouth daily.     . pregabalin (LYRICA) 100 MG capsule Take 1 capsule (100 mg total) by mouth 3 (three) times daily. 90 capsule 0  . prochlorperazine (COMPAZINE) 10 MG tablet Take 1 tablet (10 mg total) by mouth every 6 (six) hours as needed for nausea or vomiting. 30 tablet 0  . rosuvastatin (CRESTOR) 5 MG tablet Take 1 tablet (5 mg total) by mouth daily.    . sertraline (ZOLOFT) 100 MG tablet Take 1 tablet (100 mg total) by mouth daily.    . traMADol (ULTRAM) 50 MG tablet Take 50 mg by mouth every 6 (six) hours as needed.    . triamterene-hydrochlorothiazide (DYAZIDE) 37.5-25 MG capsule TAKE 1 CAPSULE BY MOUTH ONCE DAILY 90 capsule 0  . Vitamin D, Cholecalciferol, 1000 units CAPS Take 1 tablet by mouth daily. (Patient taking differently: Take 1,000 Units by mouth daily. ) 90 capsule 4   No current facility-administered medications for this visit.    OBJECTIVE: African-American woman in no acute distress  Vitals:   04/25/20 0921  BP: (!) 163/86  Pulse: 93  Resp: 20  Temp: 97.7 F (36.5 C)  SpO2: 100%     Body mass index is 40.84 kg/m.    ECOG FS:1 - Symptomatic but completely ambulatory  Filed Weights   04/25/20 0921  Weight: 256 lb 14.4 oz (116.5 kg)     Sclerae unicteric, EOMs intact Wearing a mask No cervical or supraclavicular adenopathy Lungs no rales or rhonchi Heart regular rate and rhythm Abd soft, nontender, positive bowel sounds MSK no focal spinal tenderness, no upper extremity lymphedema Neuro:  nonfocal, well oriented, appropriate affect Breasts: Status post bilateral mastectomies.  There is no evidence of chest wall recurrence.  Both axillae are benign   LAB RESULTS:  CMP     Component Value Date/Time   NA 141 04/25/2020 0906   NA 140 07/08/2016 0948   K 3.7 04/25/2020 0906   K 4.1 07/08/2016 0948   CL 101 04/25/2020 0906   CO2 31 04/25/2020 0906   CO2 24 07/08/2016 0948   GLUCOSE 204 (H) 04/25/2020 0906   GLUCOSE 89 07/08/2016 0948   BUN 11 04/25/2020 0906   BUN 11.9 07/08/2016 0948   CREATININE 1.08 (H) 04/25/2020 0906   CREATININE 0.77 05/16/2019 0905   CREATININE 0.9 07/08/2016 0948   CALCIUM 9.6 04/25/2020 0906   CALCIUM 10.0 07/08/2016 0948   PROT 7.4 04/25/2020 0906   PROT 7.6 07/08/2016 0948   ALBUMIN 3.3 (L) 04/25/2020 0906   ALBUMIN 3.6 07/08/2016 0948   AST 13 (L) 04/25/2020 0906   AST 35 05/16/2019 0905   AST 22 07/08/2016 0948   ALT 15 04/25/2020 0906   ALT 40 05/16/2019 0905   ALT 27 07/08/2016 0948   ALKPHOS 174 (H) 04/25/2020 0906   ALKPHOS 115 07/08/2016 0948   BILITOT 0.4 04/25/2020 0906   BILITOT 0.7 05/16/2019 0905   BILITOT 0.35 07/08/2016 0948   GFRNONAA >60 04/25/2020 0906   GFRNONAA >60 05/16/2019 0905   GFRAA >60 01/30/2020 0814   GFRAA >60 05/16/2019 0905    INo results found for: SPEP, UPEP  Lab Results  Component Value Date   WBC  5.9 04/25/2020   NEUTROABS 4.7 04/25/2020   HGB 12.6 04/25/2020   HCT 39.2 04/25/2020   MCV 86.3 04/25/2020   PLT 269 04/25/2020      Chemistry      Component Value Date/Time   NA 141 04/25/2020 0906   NA 140 07/08/2016 0948   K 3.7 04/25/2020 0906   K 4.1 07/08/2016 0948   CL 101 04/25/2020 0906   CO2 31 04/25/2020 0906   CO2 24 07/08/2016 0948   BUN 11 04/25/2020 0906   BUN 11.9 07/08/2016 0948   CREATININE 1.08 (H) 04/25/2020 0906   CREATININE 0.77 05/16/2019 0905   CREATININE 0.9 07/08/2016 0948      Component Value Date/Time   CALCIUM 9.6 04/25/2020 0906   CALCIUM 10.0  07/08/2016 0948   ALKPHOS 174 (H) 04/25/2020 0906   ALKPHOS 115 07/08/2016 0948   AST 13 (L) 04/25/2020 0906   AST 35 05/16/2019 0905   AST 22 07/08/2016 0948   ALT 15 04/25/2020 0906   ALT 40 05/16/2019 0905   ALT 27 07/08/2016 0948   BILITOT 0.4 04/25/2020 0906   BILITOT 0.7 05/16/2019 0905   BILITOT 0.35 07/08/2016 0948       No results found for: LABCA2  No components found for: [id number]  No results for input(s): INR in the last 168 hours.  Urinalysis No results found for: COLORURINE, APPEARANCEUR, LABSPEC, PHURINE, GLUCOSEU, HGBUR, BILIRUBINUR, KETONESUR, PROTEINUR, UROBILINOGEN, NITRITE, LEUKOCYTESUR   STUDIES: No results found.   ELIGIBLE FOR AVAILABLE RESEARCH PROTOCOL: PALLAS, but inelegible because of Mirena IUD   ASSESSMENT: 54 y.o. Debbie [name] woman status post right breast upper inner quadrant biopsy 10/15/2015 for a clinical T2-T3 N0, stage II invasive ductal carcinoma, estrogen and progesterone receptor positive, HER-2 nonamplified, with an Mib-1 of 70%  (1) status post right lumpectomy and sentinel lymph node sampling with oncoplastic breast reduction 11/28/2015 for a pT2 pN1, stage IIB invasive ductal carcinoma, grade 1, with negative margins (1/1 sentinel node positive)  (a) reclassified as stage IIA in the 2018 prognostic revision   (2) left reduction mammoplasty 11/28/2015 unexpectedly found a pT1c pNX invasive ductal carcinoma, grade 1, estrogen and progesterone receptor positive, HER-2 not amplified, with an MIB-1 of 3%  (3) Mammaprint from the right sided breast cancer was "low risk", predicting a 98% chance of no disease recurrence within 5 years with anti-estrogens as the only systemic therapy. It also predicts minimal to no benefit from chemotherapy  (4) status post bilateral mastectomies with bilateral sentinel lymph node sampling 12/26/2015, showing  (a) on the right, no residual carcinoma (0/3 nodes involved)  (b) on the left, focal ductal  carcinoma in situ, 0.2 cm, with negative margins; (0/4 nodes involved)  (5) adjuvant radiation 02/11/16 - 03/27/16    1) Right chest wall: 50.4 Gy in 28 fractions               2) Right chest wall boost: 10 Gy in 5 fractions   (6) started on tamoxifen neoadjuvantly 10/23/2015 to allow for expected delays in definitive local treatment  (a) Mirena IUD in place  (b) tamoxifen discontinued 08/13/2017, with progression  (7) since she had 4 lymph nodes removed from each axilla but also receive radiation on the right, lab draws should preferentially be obtained from the left arm   METASTATIC DISEASE: April 2019 (8) CT scan of the abdomen and pelvis 08/04/2017, MRI of the liver and total spine MRI 08/11/2017 and CT scan of the chest and brain on  08/13/2017 showed liver and bone metastases  (a) liver biopsy 09/07/2017 confirms estrogen receptor positive, progesterone receptor negative, HER-2 negative metastatic breast cancer  (b) chest CT scan 08/13/2017 shows multiple bilateral pulmonary nodules.  (c) CT of the brain with and without contrast 08/26/2017 shows no evidence of metastatic disease to the brain  (d) CA 27-29 is informative  (9) fulvestrant started 08/17/2017  (a) palbociclib started 08/31/2017  (b) fulvestrant and palbociclib discontinued 01/04/2018 with evidence of progression in the liver  (10) denosumab/Xgeva started 09/14/2017  (a) held after 03/01/2018 dose because concern re. osteonecrosis  (b) resumed 06/09/2018  (c) 02/21/2019 dose held due to oral surgery, resumed 03/21/2019  (11) anastrozole started 01/04/2018  (a) everolimus started 01/29/2018  (b) liver MRI and bone scan December 2019 show stable disease  (c) chest CT scan on 08/02/2018 shows lung lesions resolved, bone lesions stable  (d) bone scan 09/13/2018 essentially stable  (e) liver MRI 12/08/2018 shows improvement in hepatic metastsases  (f) liver MRI and bone scan 04/13/2019 showed continuing improvement in the  liver, stable bone lesions  (g) chest CT 08/01/2019 stable, no evidence of active disease ows  (12) 01/28/18 prophylactic left femur intramedullary nail surgery for impending left femur pathologic fracture  (a) palliative radiation with Dr. [name] to the left femur and left ilium from 02/16/2018 to 03/01/2018  (13) [facility] report on liver biopsy from April 2019 shows a negative PDL 1, stable MSI and proficient mismatch repair status.  The tumor is positive for the androgen receptor, and for PTEN for PIK3 CA mutations.  HER-2/neu was read as equivocal (it was negative by FISH on the pathology report).  (14) Progression noted in liver MRI on 01/11/2020; fulvestrant started on 01/30/2020  (a) alpelisib is started 01/30/2020 at 300 mg daily with Metformin 500 mg daily  (b) alpelisib held on 02/07/2020 with very high blood sugars.  Metformin increased to 1 g daily  (c) alpelisib resumed at 150 mg daily on 02/13/2020  (d) liver MRI 05/06/2020   PLAN: Devine is tolerating her treatment moderately well.  She has mild fatigue and some GI symptoms but otherwise she is extremely functional and working full-time.  She is looking forward to the holidays.  She is going to have her restaging MRI of the liver 05/06/2020.  She understands I will be out of town at that time.  She will read the report of course in "my chart" and if she has any difficulty with that she will call my nurse for further information.  Otherwise the plan is to continue the treatment since we have been doing, every 28days.  She will see me barring any new developments with the February treatment  Total encounter time 25 minutes.Sarajane Jews C. Marlowe Cinquemani, MD 04/25/20 9:53 AM Medical Oncology and Hematology [facility] [place], [place] [address] Tel. [phone]    Fax. [phone]   I, Wilburn Mylar, am acting as scribe for Dr. Virgie Dad. Nolton Denis.  I, Lurline Del MD, have reviewed the above  documentation for accuracy and completeness, and I agree with the above.    *Total Encounter Time as defined by the Centers for Medicare and Medicaid Services includes, in addition to the face-to-face time of a patient visit (documented in the note above) non-face-to-face time: obtaining and reviewing outside history, ordering and reviewing medications, tests or procedures, care coordination (communications with other health care professionals or caregivers) and documentation in the medical record.

## 2020-04-25 ENCOUNTER — Inpatient Hospital Stay: Payer: 59 | Attending: Oncology | Admitting: Oncology

## 2020-04-25 ENCOUNTER — Inpatient Hospital Stay: Payer: 59

## 2020-04-25 ENCOUNTER — Other Ambulatory Visit: Payer: Self-pay

## 2020-04-25 VITALS — BP 163/86 | HR 93 | Temp 97.7°F | Resp 20 | Ht 66.5 in | Wt 256.9 lb

## 2020-04-25 DIAGNOSIS — R918 Other nonspecific abnormal finding of lung field: Secondary | ICD-10-CM | POA: Diagnosis not present

## 2020-04-25 DIAGNOSIS — C50211 Malignant neoplasm of upper-inner quadrant of right female breast: Secondary | ICD-10-CM | POA: Insufficient documentation

## 2020-04-25 DIAGNOSIS — C50911 Malignant neoplasm of unspecified site of right female breast: Secondary | ICD-10-CM | POA: Diagnosis not present

## 2020-04-25 DIAGNOSIS — C50912 Malignant neoplasm of unspecified site of left female breast: Secondary | ICD-10-CM

## 2020-04-25 DIAGNOSIS — Z923 Personal history of irradiation: Secondary | ICD-10-CM | POA: Diagnosis not present

## 2020-04-25 DIAGNOSIS — C50011 Malignant neoplasm of nipple and areola, right female breast: Secondary | ICD-10-CM

## 2020-04-25 DIAGNOSIS — Z79899 Other long term (current) drug therapy: Secondary | ICD-10-CM | POA: Diagnosis not present

## 2020-04-25 DIAGNOSIS — C50112 Malignant neoplasm of central portion of left female breast: Secondary | ICD-10-CM

## 2020-04-25 DIAGNOSIS — Z170 Estrogen receptor positive status [ER+]: Secondary | ICD-10-CM | POA: Diagnosis not present

## 2020-04-25 DIAGNOSIS — C787 Secondary malignant neoplasm of liver and intrahepatic bile duct: Secondary | ICD-10-CM | POA: Insufficient documentation

## 2020-04-25 DIAGNOSIS — Z9013 Acquired absence of bilateral breasts and nipples: Secondary | ICD-10-CM | POA: Insufficient documentation

## 2020-04-25 DIAGNOSIS — C7951 Secondary malignant neoplasm of bone: Secondary | ICD-10-CM | POA: Insufficient documentation

## 2020-04-25 DIAGNOSIS — C50012 Malignant neoplasm of nipple and areola, left female breast: Secondary | ICD-10-CM

## 2020-04-25 LAB — COMPREHENSIVE METABOLIC PANEL
ALT: 15 U/L (ref 0–44)
AST: 13 U/L — ABNORMAL LOW (ref 15–41)
Albumin: 3.3 g/dL — ABNORMAL LOW (ref 3.5–5.0)
Alkaline Phosphatase: 174 U/L — ABNORMAL HIGH (ref 38–126)
Anion gap: 9 (ref 5–15)
BUN: 11 mg/dL (ref 6–20)
CO2: 31 mmol/L (ref 22–32)
Calcium: 9.6 mg/dL (ref 8.9–10.3)
Chloride: 101 mmol/L (ref 98–111)
Creatinine, Ser: 1.08 mg/dL — ABNORMAL HIGH (ref 0.44–1.00)
GFR, Estimated: >60 mL/min (ref >60)
Glucose, Bld: 204 mg/dL — ABNORMAL HIGH (ref 70–99)
Potassium: 3.7 mmol/L (ref 3.5–5.1)
Sodium: 141 mmol/L (ref 135–145)
Total Bilirubin: 0.4 mg/dL (ref 0.3–1.2)
Total Protein: 7.4 g/dL (ref 6.5–8.1)

## 2020-04-25 LAB — CBC WITH DIFFERENTIAL/PLATELET
Abs Immature Granulocytes: 0.03 10*3/uL (ref 0.00–0.07)
Basophils Absolute: 0 10*3/uL (ref 0.0–0.1)
Basophils Relative: 1 %
Eosinophils Absolute: 0.1 10*3/uL (ref 0.0–0.5)
Eosinophils Relative: 2 %
HCT: 39.2 % (ref 36.0–46.0)
Hemoglobin: 12.6 g/dL (ref 12.0–15.0)
Immature Granulocytes: 1 %
Lymphocytes Relative: 12 %
Lymphs Abs: 0.7 10*3/uL (ref 0.7–4.0)
MCH: 27.8 pg (ref 26.0–34.0)
MCHC: 32.1 g/dL (ref 30.0–36.0)
MCV: 86.3 fL (ref 80.0–100.0)
Monocytes Absolute: 0.3 10*3/uL (ref 0.1–1.0)
Monocytes Relative: 6 %
Neutro Abs: 4.7 10*3/uL (ref 1.7–7.7)
Neutrophils Relative %: 78 %
Platelets: 269 10*3/uL (ref 150–400)
RBC: 4.54 MIL/uL (ref 3.87–5.11)
RDW: 17.8 % — ABNORMAL HIGH (ref 11.5–15.5)
WBC: 5.9 10*3/uL (ref 4.0–10.5)
nRBC: 0 % (ref 0.0–0.2)

## 2020-04-25 MED ORDER — DENOSUMAB 120 MG/1.7ML ~~LOC~~ SOLN
SUBCUTANEOUS | Status: AC
  Filled 2020-04-25: qty 1.7

## 2020-04-25 MED ORDER — DENOSUMAB 120 MG/1.7ML ~~LOC~~ SOLN
120.0000 mg | Freq: Once | SUBCUTANEOUS | Status: AC
  Administered 2020-04-25: 120 mg via SUBCUTANEOUS

## 2020-04-25 MED ORDER — FULVESTRANT 250 MG/5ML IM SOLN
500.0000 mg | Freq: Once | INTRAMUSCULAR | Status: AC
  Administered 2020-04-25: 500 mg via INTRAMUSCULAR

## 2020-04-25 MED ORDER — FULVESTRANT 250 MG/5ML IM SOLN
INTRAMUSCULAR | Status: AC
  Filled 2020-04-25: qty 10

## 2020-04-25 MED FILL — LOSARTAN-HCTZ 100-25 MG TAB: 100-25 | 90 days supply | Qty: 90 | Fill #3

## 2020-04-25 MED FILL — LEVOTHYROXINE 137 MCG TABLE: 137 | 90 days supply | Qty: 90 | Fill #1

## 2020-04-25 NOTE — Progress Notes (Signed)
Xgeva frequency confirmed with MD. Pennelope Bracken Q month.  Hardie Pulley, PharmD, BCPS, BCOP

## 2020-04-26 ENCOUNTER — Telehealth: Payer: Self-pay | Admitting: Oncology

## 2020-04-26 LAB — CANCER ANTIGEN 27.29: CA 27.29: 57.1 U/mL — ABNORMAL HIGH (ref 0.0–38.6)

## 2020-04-26 NOTE — Telephone Encounter (Signed)
Scheduled appts per 12/16 los. Pt confirmed appt dates and times.

## 2020-05-06 ENCOUNTER — Ambulatory Visit (HOSPITAL_COMMUNITY): Payer: 59

## 2020-05-07 MED FILL — PIQRAY 300MG DAILY DOSE 2X1: 2 X 150 | 56 days supply | Qty: 56 | Fill #0

## 2020-05-08 ENCOUNTER — Ambulatory Visit (HOSPITAL_COMMUNITY): Payer: 59

## 2020-05-15 ENCOUNTER — Ambulatory Visit (HOSPITAL_COMMUNITY)
Admission: RE | Admit: 2020-05-15 | Discharge: 2020-05-15 | Disposition: A | Discharge status: Home / Self Care | Payer: 59 | Source: Ambulatory Visit | Attending: Adult Health | Admitting: Adult Health

## 2020-05-15 DIAGNOSIS — C50912 Malignant neoplasm of unspecified site of left female breast: Secondary | ICD-10-CM | POA: Diagnosis not present

## 2020-05-15 DIAGNOSIS — C7951 Secondary malignant neoplasm of bone: Secondary | ICD-10-CM | POA: Diagnosis not present

## 2020-05-15 DIAGNOSIS — C787 Secondary malignant neoplasm of liver and intrahepatic bile duct: Secondary | ICD-10-CM | POA: Insufficient documentation

## 2020-05-15 DIAGNOSIS — C50911 Malignant neoplasm of unspecified site of right female breast: Secondary | ICD-10-CM | POA: Insufficient documentation

## 2020-05-15 DIAGNOSIS — Z170 Estrogen receptor positive status [ER+]: Secondary | ICD-10-CM | POA: Insufficient documentation

## 2020-05-15 DIAGNOSIS — Z8505 Personal history of malignant neoplasm of liver: Secondary | ICD-10-CM | POA: Diagnosis not present

## 2020-05-15 DIAGNOSIS — Z853 Personal history of malignant neoplasm of breast: Secondary | ICD-10-CM | POA: Diagnosis not present

## 2020-05-15 DIAGNOSIS — K7689 Other specified diseases of liver: Secondary | ICD-10-CM | POA: Diagnosis not present

## 2020-05-15 MED ORDER — GADOBUTROL 1 MMOL/ML IV SOLN
10.0000 mL | Freq: Once | INTRAVENOUS | Status: AC | PRN
  Administered 2020-05-15: 10 mL via INTRAVENOUS

## 2020-05-15 MED FILL — SERTRALINE HCL 100 MG TABS: 100 | 30 days supply | Qty: 30 | Fill #9

## 2020-05-17 ENCOUNTER — Telehealth: Payer: Self-pay

## 2020-05-17 ENCOUNTER — Other Ambulatory Visit: Payer: Self-pay | Admitting: Oncology

## 2020-05-17 ENCOUNTER — Telehealth: Payer: Self-pay | Admitting: Pharmacist

## 2020-05-17 DIAGNOSIS — Z170 Estrogen receptor positive status [ER+]: Secondary | ICD-10-CM

## 2020-05-17 DIAGNOSIS — C50211 Malignant neoplasm of upper-inner quadrant of right female breast: Secondary | ICD-10-CM

## 2020-05-17 MED ORDER — CAPECITABINE 500 MG PO TABS: 1500.0000 mg | ORAL_TABLET | Freq: Two times a day (BID) | ORAL | 6 refills | Status: DC

## 2020-05-17 NOTE — Progress Notes (Signed)
I called Debbie [name] with the results of her MRI, which show evidence of disease progression.  At this point we have largely exhausted antiestrogen options.  We need to move to the chemotherapy side.  I am going to suggest Xeloda and I am going to go ahead and put the order now.  I will discuss it in detail with her when she returns to see me 05/21/2020.

## 2020-05-17 NOTE — Telephone Encounter (Signed)
Oral Oncology Pharmacist Encounter  Received new prescription for Xeloda (capecitabine) for the treatment of metastatic ER positive, PR negative, HER-2 negative breast cancer, planned duration until disease progression or unacceptable drug toxicity.  Prescription dose and frequency assessed.  CBC w/ Diff and CMP from 04/25/20 assessed, noted alk phos elevated (174 U/L), likely 2/2 mets, Scr slightly up from 0.95 mg/dL to 1.08 mg/dL - no dose adjustments required at this time.  Current medication list in Epic reviewed, no relevant/significant DDIs with Xeloda identified.  Evaluated chart and no patient barriers to medication adherence noted.   Prescription has been e-scribed to the [facility] for benefits analysis and approval.  Oral Oncology Clinic will continue to follow for insurance authorization, copayment issues, initial counseling and start date.  Leron Croak, PharmD, BCPS Hematology/Oncology Clinical Pharmacist [facility] [phone] 05/17/2020 6:17 PM

## 2020-05-17 NOTE — Telephone Encounter (Signed)
Oral Oncology Patient Advocate Encounter  Received notification from [place] that prior authorization for Xeloda is required.  PA submitted on CoverMyMeds Key [id number] Status is pending  Oral Oncology Clinic will continue to follow.  [facility] Phone [phone] Fax [phone] 05/17/2020 1:33 PM

## 2020-05-20 MED FILL — CAPECITABINE 500 MG TABLET: 500 | 21 days supply | Qty: 84 | Fill #0

## 2020-05-20 NOTE — Progress Notes (Signed)
[place]  Telephone:(336) [phone] [phone]    ID: CAIDYNCE MUZYKA DOB: 1965-06-13  MR#: [id number]  IWP#:[id number]  Patient Care Team: Kelton Pillar, MD as PCP - General (Family Medicine) Jacelyn Pi, MD as Consulting Physician (Endocrinology) Jovita Kussmaul, MD as Consulting Physician (General Surgery) Rudell Marlowe, Virgie Dad, MD as Consulting Physician (Oncology) [name], MD as Attending Physician (Radiation Oncology) Belva Crome, MD as Consulting Physician (Cardiology) Crissie Reese, MD as Consulting Physician (Plastic Surgery) [name], [name], NP as Nurse Practitioner (Hematology and Oncology) Lajoyce Lauber, DMD (Dentistry) OTHER MD:   CHIEF COMPLAINT: Estrogen receptor positive stage IV  breast cancer (s/p bilateral mastectomies)  CURRENT TREATMENT: Capecitabine/Xeloda; denosumab/Xgeva   INTERVAL HISTORY: Matraca returns today for follow-up and treatment of her estrogen receptor positive stage IV breast cancer.  Since her last visit, she underwent liver MRI on 05/15/2020 showing: consistent mild increase in size of multifocal hepatic metastasis lesions; several potential new lesions identified in right hepatic lobe; multifocal skeletal metastasis again noted without change.  I called Geraldina with the results of her MRI, which show evidence of disease progression.  At this point we have largely exhausted antiestrogen options.  We need to move to the chemotherapy side.  Accordingly we are stopping the fulvestrant and the alpelisib.  We are going to start capecitabine.  She continues on Xgeva every 4 weeks, with her most recent dose on 04/25/2020.  She has no side effects from this that she is aware of  Lab Results  Component Value Date   CA2729 57.1 (H) 04/25/2020   CA2729 48.6 (H) 03/28/2020   CA2729 49.2 (H) 02/29/2020   CA2729 44.0 (H) 01/24/2020   CA2729 31.4 12/12/2019    REVIEW OF SYSTEMS: Azrielle tells me that  she enjoyed the holidays which she spent partly in [place] visiting her mother.  She feels somewhat stiff in the morning but things improved during the day.  She continues to work full-time, about 10 hours/day.  She is wondering however if she is going to need to fill some FMLA papers as she starts the chemotherapy because she may need time off.  She also is taking Lyrica and requested a refill.   COVID 19 VACCINATION STATUS: Status post Pfizer x2, with booster January 2021   BREAST CANCER HISTORY: From the original intake note:  Teairra had screening mammography with tomosynthesis at the [facility] 10/02/2015. This showed 2 possible masses in the right breast as well as some suspicious calcifications. The left breast was unremarkable. On 10/15/2015 she underwent right diagnostic mammography with tomography and right breast ultrasonography. The breast density was category B. Spot magnification views confirmed an area of pleomorphic calcifications measuring up to 5 cm in the upper half of the breast. Also in the upper outer right breast there were adjacent low-density masses without distortion or calcifications. On physical exam there was no palpable abnormality.  Targeted ultrasound of the right breast found the 2 "masses" in the right upper quadrant were benign cysts measuring 0.8 and 0.5 cm respectively. Biopsy of the area of calcification on 10/15/2015 showed ([id number]) invasive ductal carcinoma, E-cadherin positive, grade 2, estrogen receptor 90% positive, progesterone receptor 50% positive, both with strong staining intensity, with an MIB-1 of 70%, and no HER-2 amplification, the signals ratio being 1.18 and the number per cell 2.60.  Her subsequent history is as detailed below    PAST MEDICAL HISTORY: Past Medical History:  Diagnosis Date  . Anxiety   .  Arthritis   . Bone metastasis ([place])   . Breast cancer of upper-inner quadrant of right female breast [facility]) oncologist-  dr  [name]--- 04/ 2019 ER/PR+ Stage IV w/ metastatic disease to liver, total spine, lung, bone   dx 06/ 2017  right breast invasive ductal carcinoma, Stage IIB, ER/PR + (pT2 pN1), grade 1-- s/p  right breast lumpecotmy w/ sln bx's and bilateral breast reduction 11-28-2015/  left breast with reduction , dx invasive ductal ca, Grade 1 (pT1cpNX) ER/PR positive/  adjuvant radiation completed 03-27-2016 to right chest wall, 2018  reclassifiec Stage IIA  . Cancer, metastatic to liver ([place])   . Chronic back pain   . Constipation   . Depression   . Family history of adverse reaction to anesthesia     mother has Copd was kept on vent   . Headache   . Heart murmur   . History of hyperthyroidism    s/p  RAI  . History of radiation therapy 02/11/16- 03/27/16   Right Chest Wall 50.4 Gy in 28 fractions, Right Chest Wall Boost 10 Gy in 5 fractions.   . Hyperlipidemia   . Hypertension   . Hypothyroidism, postradioiodine therapy    endocrinoloigst-  dr balan--  2004  s/p  RAI  . Menorrhagia 2011  . Multiple pulmonary nodules    secondary  metastatic  . PONV (postoperative nausea and vomiting)    patch helps   . SUI (stress urinary incontinence, female)   . Wears contact lenses   . Wears dentures    full upper and lower partial    PAST SURGICAL HISTORY: Past Surgical History:  Procedure Laterality Date  . BREAST LUMPECTOMY WITH NEEDLE LOCALIZATION AND AXILLARY SENTINEL LYMPH NODE BX Right 11/28/2015   Procedure: RIGHT BREAST LUMPECTOMY WITH NEEDLE LOCALIZATION AND AXILLARY SENTINEL LYMPH NODE BX;  Surgeon: Autumn Messing III, MD;  Location: [place];  Service: General;  Laterality: Right;  . BREAST REDUCTION SURGERY Bilateral 11/28/2015   Procedure: MAMMARY REDUCTION  (BREAST) BILATERAL;  Surgeon: Crissie Reese, MD;  Location: [place];  Service: Plastics;  Laterality: Bilateral;  . CESAREAN SECTION  01/12/1993  . COLONOSCOPY    . Dental surgeries    . FEMUR IM NAIL Left 01/28/2018   Procedure: INTRAMEDULLARY  (IM) NAIL FEMORAL;  Surgeon: Nicholes Stairs, MD;  Location: [place];  Service: Orthopedics;  Laterality: Left;  . HYSTEROSCOPY N/A 09/02/2017   Procedure: HYSTEROSCOPY  to remove IUD;  Surgeon: Eldred Manges, MD;  Location: [facility];  Service: Gynecology;  Laterality: N/A;  . Liver biospy    . MASTECTOMY    . MASTECTOMY W/ SENTINEL NODE BIOPSY Bilateral 12/26/2015   Procedure: BILATERAL MASTECTOMY WITH LEFT SENTINEL LYMPH NODE BIOPSY;  Surgeon: Autumn Messing III, MD;  Location: [place];  Service: General;  Laterality: Bilateral;  . Removal of IUD    . TUBAL LIGATION Bilateral 1995    FAMILY HISTORY: Family History  Problem Relation Age of Onset  . Diabetes Mother   . Hypertension Mother   . Hypertension Father   . Diabetes Maternal Grandmother   . Hypertension Sister   . Cancer Paternal Grandmother        colon  . Colon cancer Paternal Grandmother   . Esophageal cancer Neg Hx   . Stomach cancer Neg Hx   . Rectal cancer Neg Hx   The patient's parents are still living, in their early 67s as of June 2017. The patient has one brother, 2  sisters. On the maternal side there is a history of uterine and prostate cancer. On the paternal side there is a history of colon cancer and possibly uterine cancer. There is no history of breast or ovarian cancer in the family.    GYNECOLOGIC HISTORY:  No LMP recorded. (Menstrual status: IUD).  Menarche age 84. She still having regular periods. She is GX P1, first pregnancy age 49. She used oral contraceptives for a few years remotely, with no complications. She is currently on a Mirena IUD and also is status post bilateral tubal ligation.   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 Her husband [name] is a Administrator.  They are both taking appropriate precautions at work.  Their son [name], age 30, is a radio DJ.  He no longer lives with the patient.  The patient has no  grandchildren. She is not a Ambulance person.   ADVANCED DIRECTIVES: In the absence of any documents to the contrary her husband is her healthcare power of attorney   HEALTH MAINTENANCE: Social History   Tobacco Use  . Smoking status: Never Smoker  . Smokeless tobacco: Never Used  Vaping Use  . Vaping Use: Never used  Substance Use Topics  . Alcohol use: No  . Drug use: No     Colonoscopy: January 2016   PAP: January 2016   Bone density: August 2017 showed a T score of +0.7 normal  Lipid panel:  Allergies  Allergen Reactions  . Ace Inhibitors Cough  . Atorvastatin Other (See Comments)    Joint pain  . Simvastatin Other (See Comments)    Joint pain    Current Outpatient Medications  Medication Sig Dispense Refill  . prochlorperazine (COMPAZINE) 10 MG tablet Take 1 tablet (10 mg total) by mouth every 6 (six) hours as needed for nausea or vomiting. 30 tablet 0  . blood glucose meter kit and supplies KIT Dispense based on patient and insurance preference. Use up to four times daily as directed. (FOR ICD-9 250.00, 250.01). 1 each 0  . Calcium-Magnesium 250-125 MG TABS Take 1 tablet by mouth daily. 120 each 4  . capecitabine (XELODA) 500 MG tablet Take 3 tablets (1,500 mg total) by mouth 2 (two) times daily after a meal. Take for 14 days on, 7 days off, repeat every 21 days. To start 05/22/2020 84 tablet 6  . cetirizine (ZYRTEC ALLERGY) 10 MG tablet Take 10 mg by mouth daily as needed for allergies.     . chlorhexidine (PERIDEX) 0.12 % solution     . Ferrous Sulfate (IRON) 325 (65 Fe) MG TABS Take 1 tablet (325 mg total) by mouth daily. 30 each 2  . fluconazole (DIFLUCAN) 100 MG tablet 2 tablets first dose then 1 tab daily 8 tablet 0  . FREESTYLE LITE test strip USE AS DIRECTED TO TEST UP TO FOUR TIMES DAILY 100 strip 0  . furosemide (LASIX) 20 MG tablet Take 20 mg by mouth daily as needed.    Marland Kitchen ibuprofen (ADVIL) 800 MG tablet Take 800 mg by mouth every 6 (six) hours as needed.     Marland Kitchen ibuprofen (ADVIL,MOTRIN) 600 MG tablet Take 1 tablet (600 mg total) by mouth every 6 (six) hours as needed for moderate pain. 60 tablet 0  . Lancets (ONETOUCH ULTRASOFT) lancets Use as instructed 100 each 12  . levothyroxine (SYNTHROID, LEVOTHROID) 137 MCG tablet Take 137 mcg by mouth daily before breakfast.     . LORazepam (  ATIVAN) 0.5 MG tablet Take 1 tablet (0.5 mg total) by mouth at bedtime as needed for anxiety. 30 tablet 0  . losartan-hydrochlorothiazide (HYZAAR) 100-25 MG tablet Take 1 tablet by mouth daily.    . Multiple Vitamin (MULTIVITAMIN) tablet Take 1 tablet by mouth daily.    Marland Kitchen omega-3 acid ethyl esters (LOVAZA) 1 g capsule Take 1 g by mouth 2 (two) times daily.     . potassium chloride SA (K-DUR,KLOR-CON) 20 MEQ tablet Take 40 mEq by mouth daily.     . pregabalin (LYRICA) 100 MG capsule TAKE 1 CAPSULE BY MOUTH 3 TIMES DAILY 90 capsule 0  . prochlorperazine (COMPAZINE) 10 MG tablet Take 1 tablet (10 mg total) by mouth every 6 (six) hours as needed for nausea or vomiting. 30 tablet 0  . rosuvastatin (CRESTOR) 5 MG tablet Take 1 tablet (5 mg total) by mouth daily.    . sertraline (ZOLOFT) 100 MG tablet Take 1 tablet (100 mg total) by mouth daily.    . traMADol (ULTRAM) 50 MG tablet Take 50 mg by mouth every 6 (six) hours as needed.    . triamterene-hydrochlorothiazide (DYAZIDE) 37.5-25 MG capsule TAKE 1 CAPSULE BY MOUTH ONCE DAILY 90 capsule 0  . Vitamin D, Cholecalciferol, 1000 units CAPS Take 1 tablet by mouth daily. (Patient taking differently: Take 1,000 Units by mouth daily. ) 90 capsule 4   No current facility-administered medications for this visit.    OBJECTIVE: African-American woman who appears stated age  76:   05/21/20 1055  BP: (!) 155/55  Pulse: (!) 50  Resp: 16  Temp: 97.7 F (36.5 C)  SpO2: 100%     Body mass index is 40.91 kg/m.    ECOG FS:1 - Symptomatic but completely ambulatory  Filed Weights   05/21/20 1055  Weight: 257 lb 4.8 oz (116.7 kg)      Sclerae unicteric, EOMs intact Wearing a mask No cervical or supraclavicular adenopathy Lungs no rales or rhonchi Heart regular rate and rhythm Abd soft, nontender, positive bowel sounds MSK no focal spinal tenderness, no upper extremity lymphedema Neuro: nonfocal, well oriented, appropriate affect Breasts: Status post bilateral mastectomies.  There is no evidence of chest wall recurrence.  Both axillae are benign   LAB RESULTS:  CMP     Component Value Date/Time   NA 141 05/21/2020 1037   NA 140 07/08/2016 0948   K 3.1 (L) 05/21/2020 1037   K 4.1 07/08/2016 0948   CL 101 05/21/2020 1037   CO2 30 05/21/2020 1037   CO2 24 07/08/2016 0948   GLUCOSE 87 05/21/2020 1037   GLUCOSE 89 07/08/2016 0948   BUN 10 05/21/2020 1037   BUN 11.9 07/08/2016 0948   CREATININE 0.86 05/21/2020 1037   CREATININE 0.77 05/16/2019 0905   CREATININE 0.9 07/08/2016 0948   CALCIUM 9.7 05/21/2020 1037   CALCIUM 10.0 07/08/2016 0948   PROT 7.3 05/21/2020 1037   PROT 7.6 07/08/2016 0948   ALBUMIN 3.3 (L) 05/21/2020 1037   ALBUMIN 3.6 07/08/2016 0948   AST 12 (L) 05/21/2020 1037   AST 35 05/16/2019 0905   AST 22 07/08/2016 0948   ALT 12 05/21/2020 1037   ALT 40 05/16/2019 0905   ALT 27 07/08/2016 0948   ALKPHOS 138 (H) 05/21/2020 1037   ALKPHOS 115 07/08/2016 0948   BILITOT 0.5 05/21/2020 1037   BILITOT 0.7 05/16/2019 0905   BILITOT 0.35 07/08/2016 0948   GFRNONAA >60 05/21/2020 1037   GFRNONAA >60 05/16/2019 2563  GFRAA >60 01/30/2020 0814   GFRAA >60 05/16/2019 0905    INo results found for: SPEP, UPEP  Lab Results  Component Value Date   WBC 6.2 05/21/2020   NEUTROABS 4.9 05/21/2020   HGB 11.7 (L) 05/21/2020   HCT 35.5 (L) 05/21/2020   MCV 86.2 05/21/2020   PLT 277 05/21/2020      Chemistry      Component Value Date/Time   NA 141 05/21/2020 1037   NA 140 07/08/2016 0948   K 3.1 (L) 05/21/2020 1037   K 4.1 07/08/2016 0948   CL 101 05/21/2020 1037   CO2 30 05/21/2020  1037   CO2 24 07/08/2016 0948   BUN 10 05/21/2020 1037   BUN 11.9 07/08/2016 0948   CREATININE 0.86 05/21/2020 1037   CREATININE 0.77 05/16/2019 0905   CREATININE 0.9 07/08/2016 0948      Component Value Date/Time   CALCIUM 9.7 05/21/2020 1037   CALCIUM 10.0 07/08/2016 0948   ALKPHOS 138 (H) 05/21/2020 1037   ALKPHOS 115 07/08/2016 0948   AST 12 (L) 05/21/2020 1037   AST 35 05/16/2019 0905   AST 22 07/08/2016 0948   ALT 12 05/21/2020 1037   ALT 40 05/16/2019 0905   ALT 27 07/08/2016 0948   BILITOT 0.5 05/21/2020 1037   BILITOT 0.7 05/16/2019 0905   BILITOT 0.35 07/08/2016 0948       No results found for: LABCA2  No components found for: LABCA125  No results for input(s): INR in the last 168 hours.  Urinalysis No results found for: COLORURINE, APPEARANCEUR, LABSPEC, PHURINE, GLUCOSEU, HGBUR, BILIRUBINUR, KETONESUR, PROTEINUR, UROBILINOGEN, NITRITE, LEUKOCYTESUR   STUDIES: MR LIVER W WO CONTRAST  Result Date: 05/16/2020 CLINICAL DATA:  Breast cancer with liver metastasis. EXAM: MRI ABDOMEN WITHOUT AND WITH CONTRAST TECHNIQUE: Multiplanar multisequence MR imaging of the abdomen was performed both before and after the administration of intravenous contrast. CONTRAST:  95m GADAVIST GADOBUTROL 1 MMOL/ML IV SOLN COMPARISON:  Abdominal MRI 01/09/2020 FINDINGS: Lower chest:  Lung bases are clear. Hepatobiliary: The multifocal hepatic metastasis are again best demonstrated on the fat saturated T2 weighted sequence (series 11). For example: Lesion in the central LEFT hepatic lobe measuring 18 mm on image 18 compares to 11 mm on MRI 01/09/2020. Lesion in the lateral segment of the LEFT hepatic lobe (image 18) measures 9 mm compared with 8 mm. Lesion in the inferior aspect of the central RIGHT hepatic lobe (segment 5, image 24) measures 11 mm compared to 7 mm. Lesion in the inferior tip of the RIGHT hepatic lobe measures 20 mm (image 30) compared to 15 mm. Two lesions on image 23 in the  RIGHT hepatic lobe measuring 5 mm and 4 mm are not clearly seen on comparison exams. Lesions are not well depicted on the postcontrast imaging with there is some mild motion degradation. No biliary duct dilatation.  Gallbladder normal Pancreas: Normal pancreatic parenchymal intensity. No ductal dilatation or inflammation. Spleen: Normal spleen. Adrenals/urinary tract: Adrenal glands and kidneys are normal. Stomach/Bowel: Stomach and limited of the small bowel is unremarkable Vascular/Lymphatic: Abdominal aortic normal caliber. No retroperitoneal periportal lymphadenopathy. Musculoskeletal: Multiple round enhancing lesions within the thoracic and lumbar spine are unchanged from comparison MRI and CTs. IMPRESSION: 1. Consistent mild increase in size of the multifocal hepatic metastasis lesions. 2. Several potential new lesions are identified in the RIGHT hepatic lobe. 3. Multifocal skeletal metastasis again noted without change. Electronically Signed   By: SSuzy BouchardM.D.   On: 05/16/2020 08:43  ELIGIBLE FOR AVAILABLE RESEARCH PROTOCOL: PALLAS, but inelegible because of Mirena IUD   ASSESSMENT: 55 y.o. [place] woman status post right breast upper inner quadrant biopsy 10/15/2015 for a clinical T2-T3 N0, stage II invasive ductal carcinoma, estrogen and progesterone receptor positive, HER-2 nonamplified, with an Mib-1 of 70%  (1) status post right lumpectomy and sentinel lymph node sampling with oncoplastic breast reduction 11/28/2015 for a pT2 pN1, stage IIB invasive ductal carcinoma, grade 1, with negative margins (1/1 sentinel node positive)  (a) reclassified as stage IIA in the 2018 prognostic revision   (2) left reduction mammoplasty 11/28/2015 unexpectedly found a pT1c pNX invasive ductal carcinoma, grade 1, estrogen and progesterone receptor positive, HER-2 not amplified, with an MIB-1 of 3%  (3) Mammaprint from the right sided breast cancer was "low risk", predicting a 98% chance of no  disease recurrence within 5 years with anti-estrogens as the only systemic therapy. It also predicts minimal to no benefit from chemotherapy  (4) status post bilateral mastectomies with bilateral sentinel lymph node sampling 12/26/2015, showing  (a) on the right, no residual carcinoma (0/3 nodes involved)  (b) on the left, focal ductal carcinoma in situ, 0.2 cm, with negative margins; (0/4 nodes involved)  (5) adjuvant radiation 02/11/16 - 03/27/16    1) Right chest wall: 50.4 Gy in 28 fractions               2) Right chest wall boost: 10 Gy in 5 fractions   (6) started on tamoxifen neoadjuvantly 10/23/2015 to allow for expected delays in definitive local treatment  (a) Mirena IUD in place  (b) tamoxifen discontinued 08/13/2017, with progression  (7) since she had 4 lymph nodes removed from each axilla but also receive radiation on the right, lab draws should preferentially be obtained from the left arm   METASTATIC DISEASE: April 2019 (8) CT scan of the abdomen and pelvis 08/04/2017, MRI of the liver and total spine MRI 08/11/2017 and CT scan of the chest and brain on 08/13/2017 showed liver and bone metastases  (a) liver biopsy 09/07/2017 confirms estrogen receptor positive, progesterone receptor negative, HER-2 negative metastatic breast cancer  (b) chest CT scan 08/13/2017 shows multiple bilateral pulmonary nodules.  (c) CT of the brain with and without contrast 08/26/2017 shows no evidence of metastatic disease to the brain  (d) CA 27-29 is informative  (9) fulvestrant started 08/17/2017  (a) palbociclib started 08/31/2017  (b) fulvestrant and palbociclib discontinued 01/04/2018 with evidence of progression in the liver  (10) denosumab/Xgeva started 09/14/2017  (a) held after 03/01/2018 dose because concern re. osteonecrosis  (b) resumed 06/09/2018  (c) 02/21/2019 dose held due to oral surgery, resumed 03/21/2019  (11) anastrozole started 01/04/2018  (a) everolimus started  01/29/2018  (b) liver MRI and bone scan December 2019 show stable disease  (c) chest CT scan on 08/02/2018 shows lung lesions resolved, bone lesions stable  (d) bone scan 09/13/2018 essentially stable  (e) liver MRI 12/08/2018 shows improvement in hepatic metastsases  (f) liver MRI and bone scan 04/13/2019 showed continuing improvement in the liver, stable bone lesions  (g) chest CT 08/01/2019 stable, no evidence of active disease ows  (12) 01/28/18 prophylactic left femur intramedullary nail surgery for impending left femur pathologic fracture  (a) palliative radiation with Dr. [name] to the left femur and left ilium from 02/16/2018 to 03/01/2018  (13) [facility] report on liver biopsy from April 2019 shows a negative PDL 1, stable MSI and proficient mismatch repair status.  The tumor is positive  for the androgen receptor, and for PTEN for PIK3 CA mutations.  HER-2/neu was read as equivocal (it was negative by FISH on the pathology report).  (14) Progression noted in liver MRI on 01/11/2020; fulvestrant started on 01/30/2020  (a) alpelisib is started 01/30/2020 at 300 mg daily with Metformin 500 mg daily  (b) alpelisib held on 02/07/2020 with very high blood sugars.  Metformin increased to 1 g daily  (c) alpelisib resumed at 150 mg daily on 02/13/2020  (d) liver MRI 05/06/2020 shows evidence of disease progression  (e) fulvestrant and alpelisib discontinued January 2022  (15) capecitabine started 05/22/2020 at 1.5 g twice daily, 14/7   PLAN: [place] has had mild but measurable progression in the liver.  We have pretty much exhausted her antiestrogen possibilities at this point and we are moving to the chemotherapy side of the treatment equation  Capecitabine is a good starting point.  We discussed the possible toxicities side effects and complications in detail and also the oral chemotherapy pharmacologist have discussed this with her.  In particular I emphasized that she really needs to call us if  she develops severe diarrhea.  We are going to follow her every 21days, and just before she starts a new cycle.  With the labs obtained every 3 weeks we will repeat a CA 27-29 as an initial assessment of possible response.  The plan is to try to get through 4 cycles and then restage.  Ota has somehow been able to continue to work full-time despite her earlier treatments but she is appropriately concerned she may need some time off.  We will be glad to her per complete and get signed  FMLA papers as appropriate  Since she will be coming every 3 weeks for labs, I am changing her denosumab/Xgeva to every 6weeks to minimize repetitive visits  She will see me again in 3 weeks.  She knows to call for any other issue that may develop before that visit.  Total encounter time 40 minutes.Sarajane Jews C. Haide Klinker, MD 05/21/20 6:24 PM Medical Oncology and Hematology [facility] [place], [place] [address] Tel. [phone]    Fax. [phone]   I, Wilburn Mylar, am acting as scribe for Dr. Virgie Dad. Ranita Stjulien.  I, Lurline Del MD, have reviewed the above documentation for accuracy and completeness, and I agree with the above.   *Total Encounter Time as defined by the Centers for Medicare and Medicaid Services includes, in addition to the face-to-face time of a patient visit (documented in the note above) non-face-to-face time: obtaining and reviewing outside history, ordering and reviewing medications, tests or procedures, care coordination (communications with other health care professionals or caregivers) and documentation in the medical record.

## 2020-05-20 NOTE — Telephone Encounter (Signed)
Oral Oncology Patient Advocate Encounter  Prior Authorization for Xeloda has been approved.    PA# [id number] Effective dates: 05/17/20 through 05/16/21 (12 fills)  Patients co-pay is $15  Oral Oncology Clinic will continue to follow.   [place] Patient [place] Phone [phone] Fax [phone] 05/20/2020 8:31 AM

## 2020-05-20 NOTE — Telephone Encounter (Signed)
Oral Chemotherapy Pharmacist Encounter  I spoke with patient for overview of: Xeloda (capecitabine) for the treatment of metastatic ER positive, PR negative, HER-2 negative breast cancer, planned duration until disease progression or unacceptable drug toxicity.  Counseled patient on administration, dosing, side effects, monitoring, drug-food interactions, safe handling, storage, and disposal.  Patient will take Xeloda 562m tablets, 3 tablets (15031m by mouth in AM and 3 tabs (150013mby mouth in PM, within 30 minutes of finishing meals, for 14 days on, 7 days off, repeated every 21 days.  Xeloda start date: 05/22/20  Adverse effects include but are not limited to: fatigue, decreased blood counts, GI upset, diarrhea, mouth sores, and hand-foot syndrome.  Patient will obtain anti diarrheal and alert the office of 4 or more loose stools above baseline.  Reviewed with patient importance of keeping a medication schedule and plan for any missed doses. No barriers to medication adherence identified.  Medication reconciliation performed and medication/allergy list updated.  Patient will pick this up from the [facility] 05/21/20.  Patient informed the pharmacy will reach out 5-7 days prior to needing next fill of Xeloda to coordinate continued medication acquisition to prevent break in therapy.  All questions answered.  Ms. ChrWiegmaniced understanding and appreciation.   Medication education handout placed in mail for patient. Patient knows to call the office with questions or concerns. [facility] Chemotherapy [facility] phone number provided to patient.   RebLeron CroakharmD, BCPS Hematology/Oncology Clinical Pharmacist [phone]/2022 2:20 PM

## 2020-05-21 ENCOUNTER — Other Ambulatory Visit: Payer: Self-pay | Admitting: Oncology

## 2020-05-21 ENCOUNTER — Inpatient Hospital Stay: Payer: 59

## 2020-05-21 ENCOUNTER — Inpatient Hospital Stay: Payer: 59 | Attending: Oncology

## 2020-05-21 ENCOUNTER — Inpatient Hospital Stay (HOSPITAL_BASED_OUTPATIENT_CLINIC_OR_DEPARTMENT_OTHER): Payer: 59 | Admitting: Oncology

## 2020-05-21 ENCOUNTER — Other Ambulatory Visit: Payer: Self-pay

## 2020-05-21 VITALS — BP 155/55 | HR 50 | Temp 97.7°F | Resp 16 | Ht 66.5 in | Wt 257.3 lb

## 2020-05-21 DIAGNOSIS — Z6841 Body Mass Index (BMI) 40.0 and over, adult: Secondary | ICD-10-CM

## 2020-05-21 DIAGNOSIS — C50112 Malignant neoplasm of central portion of left female breast: Secondary | ICD-10-CM | POA: Diagnosis not present

## 2020-05-21 DIAGNOSIS — Z79899 Other long term (current) drug therapy: Secondary | ICD-10-CM | POA: Insufficient documentation

## 2020-05-21 DIAGNOSIS — M879 Osteonecrosis, unspecified: Secondary | ICD-10-CM

## 2020-05-21 DIAGNOSIS — C50211 Malignant neoplasm of upper-inner quadrant of right female breast: Secondary | ICD-10-CM | POA: Insufficient documentation

## 2020-05-21 DIAGNOSIS — C7951 Secondary malignant neoplasm of bone: Secondary | ICD-10-CM

## 2020-05-21 DIAGNOSIS — G893 Neoplasm related pain (acute) (chronic): Secondary | ICD-10-CM

## 2020-05-21 DIAGNOSIS — Z7189 Other specified counseling: Secondary | ICD-10-CM | POA: Diagnosis not present

## 2020-05-21 DIAGNOSIS — C50011 Malignant neoplasm of nipple and areola, right female breast: Secondary | ICD-10-CM

## 2020-05-21 DIAGNOSIS — C787 Secondary malignant neoplasm of liver and intrahepatic bile duct: Secondary | ICD-10-CM | POA: Insufficient documentation

## 2020-05-21 DIAGNOSIS — M84552A Pathological fracture in neoplastic disease, left femur, initial encounter for fracture: Secondary | ICD-10-CM

## 2020-05-21 DIAGNOSIS — Z170 Estrogen receptor positive status [ER+]: Secondary | ICD-10-CM

## 2020-05-21 DIAGNOSIS — C50012 Malignant neoplasm of nipple and areola, left female breast: Secondary | ICD-10-CM

## 2020-05-21 DIAGNOSIS — C50111 Malignant neoplasm of central portion of right female breast: Secondary | ICD-10-CM

## 2020-05-21 DIAGNOSIS — Z9013 Acquired absence of bilateral breasts and nipples: Secondary | ICD-10-CM | POA: Diagnosis not present

## 2020-05-21 DIAGNOSIS — K760 Fatty (change of) liver, not elsewhere classified: Secondary | ICD-10-CM

## 2020-05-21 LAB — CBC WITH DIFFERENTIAL/PLATELET
Abs Immature Granulocytes: 0.02 10*3/uL (ref 0.00–0.07)
Basophils Absolute: 0 10*3/uL (ref 0.0–0.1)
Basophils Relative: 0 %
Eosinophils Absolute: 0.1 10*3/uL (ref 0.0–0.5)
Eosinophils Relative: 2 %
HCT: 35.5 % — ABNORMAL LOW (ref 36.0–46.0)
Hemoglobin: 11.7 g/dL — ABNORMAL LOW (ref 12.0–15.0)
Immature Granulocytes: 0 %
Lymphocytes Relative: 10 %
Lymphs Abs: 0.6 10*3/uL — ABNORMAL LOW (ref 0.7–4.0)
MCH: 28.4 pg (ref 26.0–34.0)
MCHC: 33 g/dL (ref 30.0–36.0)
MCV: 86.2 fL (ref 80.0–100.0)
Monocytes Absolute: 0.6 10*3/uL (ref 0.1–1.0)
Monocytes Relative: 10 %
Neutro Abs: 4.9 10*3/uL (ref 1.7–7.7)
Neutrophils Relative %: 78 %
Platelets: 277 10*3/uL (ref 150–400)
RBC: 4.12 MIL/uL (ref 3.87–5.11)
RDW: 16.1 % — ABNORMAL HIGH (ref 11.5–15.5)
WBC: 6.2 10*3/uL (ref 4.0–10.5)
nRBC: 0 % (ref 0.0–0.2)

## 2020-05-21 LAB — COMPREHENSIVE METABOLIC PANEL
ALT: 12 U/L (ref 0–44)
AST: 12 U/L — ABNORMAL LOW (ref 15–41)
Albumin: 3.3 g/dL — ABNORMAL LOW (ref 3.5–5.0)
Alkaline Phosphatase: 138 U/L — ABNORMAL HIGH (ref 38–126)
Anion gap: 10 (ref 5–15)
BUN: 10 mg/dL (ref 6–20)
CO2: 30 mmol/L (ref 22–32)
Calcium: 9.7 mg/dL (ref 8.9–10.3)
Chloride: 101 mmol/L (ref 98–111)
Creatinine, Ser: 0.86 mg/dL (ref 0.44–1.00)
GFR, Estimated: >60 mL/min (ref >60)
Glucose, Bld: 87 mg/dL (ref 70–99)
Potassium: 3.1 mmol/L — ABNORMAL LOW (ref 3.5–5.1)
Sodium: 141 mmol/L (ref 135–145)
Total Bilirubin: 0.5 mg/dL (ref 0.3–1.2)
Total Protein: 7.3 g/dL (ref 6.5–8.1)

## 2020-05-21 MED ORDER — PREGABALIN 100 MG PO CAPS: 100.0000 mg | ORAL_CAPSULE | Freq: Three times a day (TID) | ORAL | 0 refills | Status: DC

## 2020-05-21 MED ORDER — DENOSUMAB 120 MG/1.7ML ~~LOC~~ SOLN
SUBCUTANEOUS | Status: AC
  Filled 2020-05-21: qty 1.7

## 2020-05-21 MED ORDER — DENOSUMAB 120 MG/1.7ML ~~LOC~~ SOLN
120.0000 mg | Freq: Once | SUBCUTANEOUS | Status: AC
  Administered 2020-05-21: 120 mg via SUBCUTANEOUS

## 2020-05-21 MED ORDER — PROCHLORPERAZINE MALEATE 10 MG PO TABS: 10.0000 mg | ORAL_TABLET | Freq: Four times a day (QID) | ORAL | 0 refills | Status: DC | PRN

## 2020-05-21 MED FILL — PROCHLORPERAZINE 10 MG TAB: 10 | 5 days supply | Qty: 30 | Fill #0

## 2020-05-21 MED FILL — PREGABALIN 100 MG CAPS: 100 | 30 days supply | Qty: 90 | Fill #0

## 2020-05-21 NOTE — Progress Notes (Signed)
START OFF PATHWAY REGIMEN - Breast   [id number]:Capecitabine 1,000 mg/m2 twice daily, 14d on 7d off:   A cycle is every 21 days:     Capecitabine   **Always confirm dose/schedule in your pharmacy ordering system**  Patient Characteristics: Distant Metastases or Locoregional Recurrent Disease - Unresected or Locally Advanced Unresectable Disease Progressing after Neoadjuvant and Local Therapies, HER2 Negative/Unknown/Equivocal, ER Positive, Chemotherapy, First Line Therapeutic Status: Distant Metastases ER Status: Positive (+) HER2 Status: Negative (-) PR Status: Positive (+) Therapy Approach Indicated: Standard Chemotherapy/Endocrine Therapy Line of Therapy: First Line Intent of Therapy: Non-Curative / Palliative Intent, Discussed with Patient

## 2020-05-22 LAB — CANCER ANTIGEN 27.29: CA 27.29: 63.2 U/mL — ABNORMAL HIGH (ref 0.0–38.6)

## 2020-05-23 ENCOUNTER — Other Ambulatory Visit: Payer: Self-pay | Admitting: Oncology

## 2020-05-23 ENCOUNTER — Other Ambulatory Visit: Payer: 59

## 2020-05-23 ENCOUNTER — Ambulatory Visit: Payer: 59

## 2020-05-24 ENCOUNTER — Encounter: Payer: Self-pay | Admitting: Adult Health

## 2020-05-24 ENCOUNTER — Other Ambulatory Visit: Payer: Self-pay | Admitting: Oncology

## 2020-05-28 ENCOUNTER — Ambulatory Visit: Payer: 59

## 2020-05-28 ENCOUNTER — Other Ambulatory Visit: Payer: 59

## 2020-06-06 MED FILL — CAPECITABINE 500 MG TABLET: 500 | 21 days supply | Qty: 84 | Fill #1

## 2020-06-08 MED FILL — SERTRALINE HCL 100 MG TABS: 100 | 30 days supply | Qty: 30 | Fill #10

## 2020-06-10 NOTE — Progress Notes (Signed)
[place]  [phone] [phone]    ID: Debbie [name] DOB: 04-14-1966  MR#: [id number]  PPJ#:[id number]  Patient Care Team: Kelton Pillar, MD as PCP - General (Family Medicine) Jacelyn Pi, MD as Consulting Physician (Endocrinology) Jovita Kussmaul, MD as Consulting Physician (General Surgery) Abed Schar, Virgie Dad, MD as Consulting Physician (Oncology) [name], MD as Attending Physician (Radiation Oncology) Belva Crome, MD as Consulting Physician (Cardiology) Crissie Reese, MD as Consulting Physician (Plastic Surgery) [name], [name], NP as Nurse Practitioner (Hematology and Oncology) Lajoyce Lauber, DMD (Dentistry) OTHER MD:   CHIEF COMPLAINT: Estrogen receptor positive stage IV  breast cancer (s/p bilateral mastectomies)  CURRENT TREATMENT: Capecitabine/Xeloda; denosumab/Xgeva   INTERVAL HISTORY: Debbie [name] returns today for follow-up of her estrogen receptor positive stage IV breast cancer.  She was switched to capecitabine at her last visit on 05/21/2020.  Today is the end of her off week and she will resume tomorrow.  She continues on Xgeva every 4 weeks, with her most recent dose on 05/21/2020.  She has no side effects from this that she is aware of  Lab Results  Component Value Date   CA2729 63.2 (H) 05/21/2020   CA2729 57.1 (H) 04/25/2020   CA2729 48.6 (H) 03/28/2020   CA2729 49.2 (H) 02/29/2020   CA2729 44.0 (H) 01/24/2020    REVIEW OF SYSTEMS: Debbie [name] feels a little bit more stiff and sore than usual.  The calves in particular and the ankle seem to her swollen and a bit crampy.  She has a little numbness at the bottom of her feet.  Her appetite is down although she has gained a little weight she says she is a little bit more tired than usual.  Was worse this her low back pain and she describes that as severe.  Her tongue is sore but she has no mouth sores and she has no diarrhea.  She has no plantar  palmar erythrodysesthesia.   COVID 19 VACCINATION STATUS: Status post Pfizer x2, with booster January 2021   BREAST CANCER HISTORY: From the original intake note:  Debbie [name] had screening mammography with tomosynthesis at the [facility] 10/02/2015. This showed 2 possible masses in the right breast as well as some suspicious calcifications. The left breast was unremarkable. On 10/15/2015 she underwent right diagnostic mammography with tomography and right breast ultrasonography. The breast density was category B. Spot magnification views confirmed an area of pleomorphic calcifications measuring up to 5 cm in the upper half of the breast. Also in the upper outer right breast there were adjacent low-density masses without distortion or calcifications. On physical exam there was no palpable abnormality.  Targeted ultrasound of the right breast found the 2 "masses" in the right upper quadrant were benign cysts measuring 0.8 and 0.5 cm respectively. Biopsy of the area of calcification on 10/15/2015 showed ([id number]) invasive ductal carcinoma, E-cadherin positive, grade 2, estrogen receptor 90% positive, progesterone receptor 50% positive, both with strong staining intensity, with an MIB-1 of 70%, and no HER-2 amplification, the signals ratio being 1.18 and the number per cell 2.60.  Her subsequent history is as detailed below    PAST MEDICAL HISTORY: Past Medical History:  Diagnosis Date  . Anxiety   . Arthritis   . Bone metastasis ([place])   . Breast cancer of upper-inner quadrant of right female breast [facility]) oncologist-  dr [name]--- 04/ 2019 ER/PR+ Stage IV w/ metastatic disease to liver, total spine, lung, bone   dx 06/  2017  right breast invasive ductal carcinoma, Stage IIB, ER/PR + (pT2 pN1), grade 1-- s/p  right breast lumpecotmy w/ sln bx's and bilateral breast reduction 11-28-2015/  left breast with reduction , dx invasive ductal ca, Grade 1 (pT1cpNX) ER/PR positive/  adjuvant  radiation completed 03-27-2016 to right chest wall, 2018  reclassifiec Stage IIA  . Cancer, metastatic to liver ([place])   . Chronic back pain   . Constipation   . Depression   . Family history of adverse reaction to anesthesia     mother has Copd was kept on vent   . Headache   . Heart murmur   . History of hyperthyroidism    s/p  RAI  . History of radiation therapy 02/11/16- 03/27/16   Right Chest Wall 50.4 Gy in 28 fractions, Right Chest Wall Boost 10 Gy in 5 fractions.   . Hyperlipidemia   . Hypertension   . Hypothyroidism, postradioiodine therapy    endocrinoloigst-  dr balan--  2004  s/p  RAI  . Menorrhagia 2011  . Multiple pulmonary nodules    secondary  metastatic  . PONV (postoperative nausea and vomiting)    patch helps   . SUI (stress urinary incontinence, female)   . Wears contact lenses   . Wears dentures    full upper and lower partial    PAST SURGICAL HISTORY: Past Surgical History:  Procedure Laterality Date  . BREAST LUMPECTOMY WITH NEEDLE LOCALIZATION AND AXILLARY SENTINEL LYMPH NODE BX Right 11/28/2015   Procedure: RIGHT BREAST LUMPECTOMY WITH NEEDLE LOCALIZATION AND AXILLARY SENTINEL LYMPH NODE BX;  Surgeon: Autumn Messing III, MD;  Location: [place];  Service: General;  Laterality: Right;  . BREAST REDUCTION SURGERY Bilateral 11/28/2015   Procedure: MAMMARY REDUCTION  (BREAST) BILATERAL;  Surgeon: Crissie Reese, MD;  Location: [place];  Service: Plastics;  Laterality: Bilateral;  . CESAREAN SECTION  01/12/1993  . COLONOSCOPY    . Dental surgeries    . FEMUR IM NAIL Left 01/28/2018   Procedure: INTRAMEDULLARY (IM) NAIL FEMORAL;  Surgeon: Nicholes Stairs, MD;  Location: [place];  Service: Orthopedics;  Laterality: Left;  . HYSTEROSCOPY N/A 09/02/2017   Procedure: HYSTEROSCOPY  to remove IUD;  Surgeon: Eldred Manges, MD;  Location: [facility];  Service: Gynecology;  Laterality: N/A;  . Liver biospy    . MASTECTOMY    . MASTECTOMY W/ SENTINEL NODE  BIOPSY Bilateral 12/26/2015   Procedure: BILATERAL MASTECTOMY WITH LEFT SENTINEL LYMPH NODE BIOPSY;  Surgeon: Autumn Messing III, MD;  Location: [place];  Service: General;  Laterality: Bilateral;  . Removal of IUD    . TUBAL LIGATION Bilateral 1995    FAMILY HISTORY: Family History  Problem Relation Age of Onset  . Diabetes Mother   . Hypertension Mother   . Hypertension Father   . Diabetes Maternal Grandmother   . Hypertension Sister   . Cancer Paternal Grandmother        colon  . Colon cancer Paternal Grandmother   . Esophageal cancer Neg Hx   . Stomach cancer Neg Hx   . Rectal cancer Neg Hx   The patient's parents are still living, in their early 83s as of June 2017. The patient has one brother, 2 sisters. On the maternal side there is a history of uterine and prostate cancer. On the paternal side there is a history of colon cancer and possibly uterine cancer. There is no history of breast or ovarian cancer in the family.  GYNECOLOGIC HISTORY:  No LMP recorded. (Menstrual status: IUD).  Menarche age 3. She still having regular periods. She is GX P1, first pregnancy age 2. She used oral contraceptives for a few years remotely, with no complications. She is currently on a Mirena IUD and also is status post bilateral tubal ligation.   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 Her husband [name] is a Administrator.  They are both taking appropriate precautions at work.  Their son [name], age 25, is a radio DJ.  He no longer lives with the patient.  The patient has no grandchildren. She is not a Ambulance person.   ADVANCED DIRECTIVES: In the absence of any documents to the contrary her husband is her healthcare power of attorney   HEALTH MAINTENANCE: Social History   Tobacco Use  . Smoking status: Never Smoker  . Smokeless tobacco: Never Used  Vaping Use  . Vaping Use: Never used  Substance Use Topics  . Alcohol use: No  . Drug  use: No     Colonoscopy: January 2016   PAP: January 2016   Bone density: August 2017 showed a T score of +0.7 normal  Lipid panel:  Allergies  Allergen Reactions  . Ace Inhibitors Cough  . Atorvastatin Other (See Comments)    Joint pain  . Simvastatin Other (See Comments)    Joint pain    Current Outpatient Medications  Medication Sig Dispense Refill  . blood glucose meter kit and supplies KIT Dispense based on patient and insurance preference. Use up to four times daily as directed. (FOR ICD-9 250.00, 250.01). 1 each 0  . Calcium-Magnesium 250-125 MG TABS Take 1 tablet by mouth daily. 120 each 4  . capecitabine (XELODA) 500 MG tablet Take 3 tablets (1,500 mg total) by mouth 2 (two) times daily after a meal. Take for 14 days on, 7 days off, repeat every 21 days. To start 05/22/2020 84 tablet 6  . cetirizine (ZYRTEC ALLERGY) 10 MG tablet Take 10 mg by mouth daily as needed for allergies.     . chlorhexidine (PERIDEX) 0.12 % solution     . Ferrous Sulfate (IRON) 325 (65 Fe) MG TABS Take 1 tablet (325 mg total) by mouth daily. 30 each 2  . fluconazole (DIFLUCAN) 100 MG tablet 2 tablets first dose then 1 tab daily 8 tablet 0  . FREESTYLE LITE test strip USE AS DIRECTED TO TEST UP TO FOUR TIMES DAILY 100 strip 0  . furosemide (LASIX) 20 MG tablet Take 20 mg by mouth daily as needed.    Marland Kitchen ibuprofen (ADVIL) 800 MG tablet Take 800 mg by mouth every 6 (six) hours as needed.    Marland Kitchen ibuprofen (ADVIL,MOTRIN) 600 MG tablet Take 1 tablet (600 mg total) by mouth every 6 (six) hours as needed for moderate pain. 60 tablet 0  . Lancets (ONETOUCH ULTRASOFT) lancets Use as instructed 100 each 12  . levothyroxine (SYNTHROID, LEVOTHROID) 137 MCG tablet Take 137 mcg by mouth daily before breakfast.     . LORazepam (ATIVAN) 0.5 MG tablet Take 1 tablet (0.5 mg total) by mouth at bedtime as needed for anxiety. 30 tablet 0  . losartan-hydrochlorothiazide (HYZAAR) 100-25 MG tablet Take 1 tablet by mouth daily.     . Multiple Vitamin (MULTIVITAMIN) tablet Take 1 tablet by mouth daily.    Marland Kitchen omega-3 acid ethyl esters (LOVAZA) 1 g capsule Take 1 g by mouth 2 (two) times daily.     Marland Kitchen  potassium chloride SA (K-DUR,KLOR-CON) 20 MEQ tablet Take 40 mEq by mouth daily.     . pregabalin (LYRICA) 100 MG capsule TAKE 1 CAPSULE BY MOUTH 3 TIMES DAILY 90 capsule 0  . prochlorperazine (COMPAZINE) 10 MG tablet Take 1 tablet (10 mg total) by mouth every 6 (six) hours as needed for nausea or vomiting. 30 tablet 0  . prochlorperazine (COMPAZINE) 10 MG tablet Take 1 tablet (10 mg total) by mouth every 6 (six) hours as needed for nausea or vomiting. 30 tablet 0  . rosuvastatin (CRESTOR) 5 MG tablet Take 1 tablet (5 mg total) by mouth daily.    . sertraline (ZOLOFT) 100 MG tablet Take 1 tablet (100 mg total) by mouth daily.    . traMADol (ULTRAM) 50 MG tablet Take 50 mg by mouth every 6 (six) hours as needed.    . triamterene-hydrochlorothiazide (DYAZIDE) 37.5-25 MG capsule TAKE 1 CAPSULE BY MOUTH ONCE DAILY 90 capsule 0  . Vitamin D, Cholecalciferol, 1000 units CAPS Take 1 tablet by mouth daily. (Patient taking differently: Take 1,000 Units by mouth daily. ) 90 capsule 4   No current facility-administered medications for this visit.    OBJECTIVE: African-American woman who appears stated age  33:   06/11/20 1218  BP: (!) 156/75  Pulse: 89  Resp: 17  Temp: (!) 97.1 F (36.2 C)  SpO2: 100%     Body mass index is 42.37 kg/m.    ECOG FS:1 - Symptomatic but completely ambulatory  Filed Weights   06/11/20 1218  Weight: 266 lb 8 oz (120.9 kg)     Sclerae unicteric, EOMs intact Wearing a mask No cervical or supraclavicular adenopathy Lungs no rales or rhonchi Heart regular rate and rhythm Abd soft, nontender, positive bowel sounds MSK no focal spinal tenderness, no upper extremity lymphedema Neuro: nonfocal, well oriented, appropriate affect Breasts: Status post bilateral mastectomies with no evidence of local  recurrence.  Both axillae are benign.   LAB RESULTS:  CMP     Component Value Date/Time   NA 141 05/21/2020 1037   NA 140 07/08/2016 0948   K 3.1 (L) 05/21/2020 1037   K 4.1 07/08/2016 0948   CL 101 05/21/2020 1037   CO2 30 05/21/2020 1037   CO2 24 07/08/2016 0948   GLUCOSE 87 05/21/2020 1037   GLUCOSE 89 07/08/2016 0948   BUN 10 05/21/2020 1037   BUN 11.9 07/08/2016 0948   CREATININE 0.86 05/21/2020 1037   CREATININE 0.77 05/16/2019 0905   CREATININE 0.9 07/08/2016 0948   CALCIUM 9.7 05/21/2020 1037   CALCIUM 10.0 07/08/2016 0948   PROT 7.3 05/21/2020 1037   PROT 7.6 07/08/2016 0948   ALBUMIN 3.3 (L) 05/21/2020 1037   ALBUMIN 3.6 07/08/2016 0948   AST 12 (L) 05/21/2020 1037   AST 35 05/16/2019 0905   AST 22 07/08/2016 0948   ALT 12 05/21/2020 1037   ALT 40 05/16/2019 0905   ALT 27 07/08/2016 0948   ALKPHOS 138 (H) 05/21/2020 1037   ALKPHOS 115 07/08/2016 0948   BILITOT 0.5 05/21/2020 1037   BILITOT 0.7 05/16/2019 0905   BILITOT 0.35 07/08/2016 0948   GFRNONAA >60 05/21/2020 1037   GFRNONAA >60 05/16/2019 0905   GFRAA >60 01/30/2020 0814   GFRAA >60 05/16/2019 0905    INo results found for: SPEP, UPEP  Lab Results  Component Value Date   WBC 6.0 06/11/2020   NEUTROABS 4.5 06/11/2020   HGB 11.5 (L) 06/11/2020   HCT 36.1 06/11/2020  MCV 90.9 06/11/2020   PLT 286 06/11/2020      Chemistry      Component Value Date/Time   NA 141 05/21/2020 1037   NA 140 07/08/2016 0948   K 3.1 (L) 05/21/2020 1037   K 4.1 07/08/2016 0948   CL 101 05/21/2020 1037   CO2 30 05/21/2020 1037   CO2 24 07/08/2016 0948   BUN 10 05/21/2020 1037   BUN 11.9 07/08/2016 0948   CREATININE 0.86 05/21/2020 1037   CREATININE 0.77 05/16/2019 0905   CREATININE 0.9 07/08/2016 0948      Component Value Date/Time   CALCIUM 9.7 05/21/2020 1037   CALCIUM 10.0 07/08/2016 0948   ALKPHOS 138 (H) 05/21/2020 1037   ALKPHOS 115 07/08/2016 0948   AST 12 (L) 05/21/2020 1037   AST 35  05/16/2019 0905   AST 22 07/08/2016 0948   ALT 12 05/21/2020 1037   ALT 40 05/16/2019 0905   ALT 27 07/08/2016 0948   BILITOT 0.5 05/21/2020 1037   BILITOT 0.7 05/16/2019 0905   BILITOT 0.35 07/08/2016 0948       No results found for: LABCA2  No components found for: LABCA125  No results for input(s): INR in the last 168 hours.  Urinalysis No results found for: COLORURINE, APPEARANCEUR, LABSPEC, PHURINE, GLUCOSEU, HGBUR, BILIRUBINUR, KETONESUR, PROTEINUR, UROBILINOGEN, NITRITE, LEUKOCYTESUR   STUDIES: MR LIVER W WO CONTRAST  Result Date: 05/16/2020 CLINICAL DATA:  Breast cancer with liver metastasis. EXAM: MRI ABDOMEN WITHOUT AND WITH CONTRAST TECHNIQUE: Multiplanar multisequence MR imaging of the abdomen was performed both before and after the administration of intravenous contrast. CONTRAST:  42m GADAVIST GADOBUTROL 1 MMOL/ML IV SOLN COMPARISON:  Abdominal MRI 01/09/2020 FINDINGS: Lower chest:  Lung bases are clear. Hepatobiliary: The multifocal hepatic metastasis are again best demonstrated on the fat saturated T2 weighted sequence (series 11). For example: Lesion in the central LEFT hepatic lobe measuring 18 mm on image 18 compares to 11 mm on MRI 01/09/2020. Lesion in the lateral segment of the LEFT hepatic lobe (image 18) measures 9 mm compared with 8 mm. Lesion in the inferior aspect of the central RIGHT hepatic lobe (segment 5, image 24) measures 11 mm compared to 7 mm. Lesion in the inferior tip of the RIGHT hepatic lobe measures 20 mm (image 30) compared to 15 mm. Two lesions on image 23 in the RIGHT hepatic lobe measuring 5 mm and 4 mm are not clearly seen on comparison exams. Lesions are not well depicted on the postcontrast imaging with there is some mild motion degradation. No biliary duct dilatation.  Gallbladder normal Pancreas: Normal pancreatic parenchymal intensity. No ductal dilatation or inflammation. Spleen: Normal spleen. Adrenals/urinary tract: Adrenal glands and  kidneys are normal. Stomach/Bowel: Stomach and limited of the small bowel is unremarkable Vascular/Lymphatic: Abdominal aortic normal caliber. No retroperitoneal periportal lymphadenopathy. Musculoskeletal: Multiple round enhancing lesions within the thoracic and lumbar spine are unchanged from comparison MRI and CTs. IMPRESSION: 1. Consistent mild increase in size of the multifocal hepatic metastasis lesions. 2. Several potential new lesions are identified in the RIGHT hepatic lobe. 3. Multifocal skeletal metastasis again noted without change. Electronically Signed   By: SSuzy BouchardM.D.   On: 05/16/2020 08:43     ELIGIBLE FOR AVAILABLE RESEARCH PROTOCOL: PALLAS, but inelegible because of Mirena IUD   ASSESSMENT: 55y.o.  woman status post right breast upper inner quadrant biopsy 10/15/2015 for a clinical T2-T3 N0, stage II invasive ductal carcinoma, estrogen and progesterone receptor positive, HER-2 nonamplified, with  an Mib-1 of 70%  (1) status post right lumpectomy and sentinel lymph node sampling with oncoplastic breast reduction 11/28/2015 for a pT2 pN1, stage IIB invasive ductal carcinoma, grade 1, with negative margins (1/1 sentinel node positive)  (a) reclassified as stage IIA in the 2018 prognostic revision   (2) left reduction mammoplasty 11/28/2015 unexpectedly found a pT1c pNX invasive ductal carcinoma, grade 1, estrogen and progesterone receptor positive, HER-2 not amplified, with an MIB-1 of 3%  (3) Mammaprint from the right sided breast cancer was "low risk", predicting a 98% chance of no disease recurrence within 5 years with anti-estrogens as the only systemic therapy. It also predicts minimal to no benefit from chemotherapy  (4) status post bilateral mastectomies with bilateral sentinel lymph node sampling 12/26/2015, showing  (a) on the right, no residual carcinoma (0/3 nodes involved)  (b) on the left, focal ductal carcinoma in situ, 0.2 cm, with negative  margins; (0/4 nodes involved)  (5) adjuvant radiation 02/11/16 - 03/27/16    1) Right chest wall: 50.4 Gy in 28 fractions               2) Right chest wall boost: 10 Gy in 5 fractions   (6) started on tamoxifen neoadjuvantly 10/23/2015 to allow for expected delays in definitive local treatment  (a) Mirena IUD in place  (b) tamoxifen discontinued 08/13/2017, with progression  (7) since she had 4 lymph nodes removed from each axilla but also receive radiation on the right, lab draws should preferentially be obtained from the left arm   METASTATIC DISEASE: April 2019 (8) CT scan of the abdomen and pelvis 08/04/2017, MRI of the liver and total spine MRI 08/11/2017 and CT scan of the chest and brain on 08/13/2017 showed liver and bone metastases  (a) liver biopsy 09/07/2017 confirms estrogen receptor positive, progesterone receptor negative, HER-2 negative metastatic breast cancer  (b) chest CT scan 08/13/2017 shows multiple bilateral pulmonary nodules.  (c) CT of the brain with and without contrast 08/26/2017 shows no evidence of metastatic disease to the brain  (d) CA 27-29 is informative  (9) fulvestrant started 08/17/2017  (a) palbociclib started 08/31/2017  (b) fulvestrant and palbociclib discontinued 01/04/2018 with evidence of progression in the liver  (10) denosumab/Xgeva started 09/14/2017  (a) held after 03/01/2018 dose because concern re. osteonecrosis  (b) resumed 06/09/2018  (c) 02/21/2019 dose held due to oral surgery, resumed 03/21/2019  (11) anastrozole started 01/04/2018  (a) everolimus started 01/29/2018  (b) liver MRI and bone scan December 2019 show stable disease  (c) chest CT scan on 08/02/2018 shows lung lesions resolved, bone lesions stable  (d) bone scan 09/13/2018 essentially stable  (e) liver MRI 12/08/2018 shows improvement in hepatic metastsases  (f) liver MRI and bone scan 04/13/2019 showed continuing improvement in the liver, stable bone lesions  (g) chest CT  08/01/2019 stable, no evidence of active disease ows  (12) 01/28/18 prophylactic left femur intramedullary nail surgery for impending left femur pathologic fracture  (a) palliative radiation with Dr. [name] to the left femur and left ilium from 02/16/2018 to 03/01/2018  (13) [facility] report on liver biopsy from April 2019 shows a negative PDL 1, stable MSI and proficient mismatch repair status.  The tumor is positive for the androgen receptor, and for PTEN for PIK3 CA mutations.  HER-2/neu was read as equivocal (it was negative by FISH on the pathology report).  (14) Progression noted in liver MRI on 01/11/2020; fulvestrant started on 01/30/2020  (a) alpelisib is started 01/30/2020 at  300 mg daily with Metformin 500 mg daily  (b) alpelisib held on 02/07/2020 with very high blood sugars.  Metformin increased to 1 g daily  (c) alpelisib resumed at 150 mg daily on 02/13/2020  (d) liver MRI 05/06/2020 shows evidence of disease progression  (e) fulvestrant and alpelisib discontinued January 2022  (15) capecitabine started 05/22/2020 at 1.5 g twice daily, 14/7   PLAN: Debbie [name] is tolerating the capecitabine moderately well.  She has not had mouth sores diarrhea or palmar plantar erythrodysesthesia.  She has a variety of other symptoms which may or may not be related.  Accordingly she is starting cycle 2 tomorrow at the same dose.  She does have more work and worsening back pain.  I am going to get plain films of her pelvis and left hip today.  We did look over her bone scan and she certainly does have lesions particularly on the left side that could be accounting for this or it could be simple arthritis.  It may be that we will need a an MRI to sort this out  She will return 06/20/2020 for her next Xgeva dose and then she will see me with the March Xgeva dose.  At that point I will set her up for repeat liver MRI in April  She knows to call for any other issue that may develop before the next  visit  Total encounter time 425 minutes.Sarajane Jews C. Nykeem Citro, MD 06/11/20 12:31 PM Medical Oncology and Hematology [facility] [place], [place] [address] Tel. [phone]    Fax. [phone]   I, Wilburn Mylar, am acting as scribe for Dr. Virgie Dad. Buena Boehm.  I, Lurline Del MD, have reviewed the above documentation for accuracy and completeness, and I agree with the above.   *Total Encounter Time as defined by the Centers for Medicare and Medicaid Services includes, in addition to the face-to-face time of a patient visit (documented in the note above) non-face-to-face time: obtaining and reviewing outside history, ordering and reviewing medications, tests or procedures, care coordination (communications with other health care professionals or caregivers) and documentation in the medical record.

## 2020-06-11 ENCOUNTER — Ambulatory Visit (HOSPITAL_COMMUNITY)
Admission: RE | Admit: 2020-06-11 | Discharge: 2020-06-11 | Disposition: A | Discharge status: Home / Self Care | Payer: 59 | Source: Ambulatory Visit | Attending: Oncology | Admitting: Oncology

## 2020-06-11 ENCOUNTER — Other Ambulatory Visit: Payer: Self-pay

## 2020-06-11 ENCOUNTER — Inpatient Hospital Stay: Payer: 59

## 2020-06-11 ENCOUNTER — Inpatient Hospital Stay: Payer: 59 | Attending: Oncology | Admitting: Oncology

## 2020-06-11 VITALS — BP 156/75 | HR 89 | Temp 97.1°F | Resp 17 | Ht 66.5 in | Wt 266.5 lb

## 2020-06-11 DIAGNOSIS — Z7189 Other specified counseling: Secondary | ICD-10-CM

## 2020-06-11 DIAGNOSIS — M879 Osteonecrosis, unspecified: Secondary | ICD-10-CM

## 2020-06-11 DIAGNOSIS — C50011 Malignant neoplasm of nipple and areola, right female breast: Secondary | ICD-10-CM

## 2020-06-11 DIAGNOSIS — Z975 Presence of (intrauterine) contraceptive device: Secondary | ICD-10-CM | POA: Insufficient documentation

## 2020-06-11 DIAGNOSIS — C7951 Secondary malignant neoplasm of bone: Secondary | ICD-10-CM

## 2020-06-11 DIAGNOSIS — Z800 Family history of malignant neoplasm of digestive organs: Secondary | ICD-10-CM | POA: Diagnosis not present

## 2020-06-11 DIAGNOSIS — M47816 Spondylosis without myelopathy or radiculopathy, lumbar region: Secondary | ICD-10-CM | POA: Diagnosis not present

## 2020-06-11 DIAGNOSIS — C50211 Malignant neoplasm of upper-inner quadrant of right female breast: Secondary | ICD-10-CM | POA: Insufficient documentation

## 2020-06-11 DIAGNOSIS — M545 Low back pain, unspecified: Secondary | ICD-10-CM | POA: Insufficient documentation

## 2020-06-11 DIAGNOSIS — Z170 Estrogen receptor positive status [ER+]: Secondary | ICD-10-CM

## 2020-06-11 DIAGNOSIS — C50112 Malignant neoplasm of central portion of left female breast: Secondary | ICD-10-CM | POA: Insufficient documentation

## 2020-06-11 DIAGNOSIS — Z9013 Acquired absence of bilateral breasts and nipples: Secondary | ICD-10-CM | POA: Diagnosis not present

## 2020-06-11 DIAGNOSIS — Z8042 Family history of malignant neoplasm of prostate: Secondary | ICD-10-CM | POA: Diagnosis not present

## 2020-06-11 DIAGNOSIS — Z8049 Family history of malignant neoplasm of other genital organs: Secondary | ICD-10-CM | POA: Insufficient documentation

## 2020-06-11 DIAGNOSIS — C787 Secondary malignant neoplasm of liver and intrahepatic bile duct: Secondary | ICD-10-CM | POA: Insufficient documentation

## 2020-06-11 DIAGNOSIS — C50012 Malignant neoplasm of nipple and areola, left female breast: Secondary | ICD-10-CM

## 2020-06-11 DIAGNOSIS — R978 Other abnormal tumor markers: Secondary | ICD-10-CM | POA: Insufficient documentation

## 2020-06-11 DIAGNOSIS — Z79899 Other long term (current) drug therapy: Secondary | ICD-10-CM | POA: Diagnosis not present

## 2020-06-11 DIAGNOSIS — M1612 Unilateral primary osteoarthritis, left hip: Secondary | ICD-10-CM | POA: Diagnosis not present

## 2020-06-11 DIAGNOSIS — Z923 Personal history of irradiation: Secondary | ICD-10-CM | POA: Insufficient documentation

## 2020-06-11 LAB — CBC WITH DIFFERENTIAL/PLATELET
Abs Immature Granulocytes: 0.03 10*3/uL (ref 0.00–0.07)
Basophils Absolute: 0 10*3/uL (ref 0.0–0.1)
Basophils Relative: 1 %
Eosinophils Absolute: 0.2 10*3/uL (ref 0.0–0.5)
Eosinophils Relative: 4 %
HCT: 36.1 % (ref 36.0–46.0)
Hemoglobin: 11.5 g/dL — ABNORMAL LOW (ref 12.0–15.0)
Immature Granulocytes: 1 %
Lymphocytes Relative: 12 %
Lymphs Abs: 0.7 10*3/uL (ref 0.7–4.0)
MCH: 29 pg (ref 26.0–34.0)
MCHC: 31.9 g/dL (ref 30.0–36.0)
MCV: 90.9 fL (ref 80.0–100.0)
Monocytes Absolute: 0.5 10*3/uL (ref 0.1–1.0)
Monocytes Relative: 8 %
Neutro Abs: 4.5 10*3/uL (ref 1.7–7.7)
Neutrophils Relative %: 74 %
Platelets: 286 10*3/uL (ref 150–400)
RBC: 3.97 MIL/uL (ref 3.87–5.11)
RDW: 18 % — ABNORMAL HIGH (ref 11.5–15.5)
WBC: 6 10*3/uL (ref 4.0–10.5)
nRBC: 0 % (ref 0.0–0.2)

## 2020-06-11 LAB — COMPREHENSIVE METABOLIC PANEL
ALT: 13 U/L (ref 0–44)
AST: 14 U/L — ABNORMAL LOW (ref 15–41)
Albumin: 3.3 g/dL — ABNORMAL LOW (ref 3.5–5.0)
Alkaline Phosphatase: 155 U/L — ABNORMAL HIGH (ref 38–126)
Anion gap: 10 (ref 5–15)
BUN: 10 mg/dL (ref 6–20)
CO2: 28 mmol/L (ref 22–32)
Calcium: 9.3 mg/dL (ref 8.9–10.3)
Chloride: 106 mmol/L (ref 98–111)
Creatinine, Ser: 0.97 mg/dL (ref 0.44–1.00)
GFR, Estimated: >60 mL/min (ref >60)
Glucose, Bld: 91 mg/dL (ref 70–99)
Potassium: 3.5 mmol/L (ref 3.5–5.1)
Sodium: 144 mmol/L (ref 135–145)
Total Bilirubin: 0.3 mg/dL (ref 0.3–1.2)
Total Protein: 7.2 g/dL (ref 6.5–8.1)

## 2020-06-12 ENCOUNTER — Encounter: Payer: Self-pay | Admitting: Oncology

## 2020-06-13 ENCOUNTER — Telehealth: Payer: Self-pay | Admitting: Oncology

## 2020-06-13 NOTE — Telephone Encounter (Signed)
Made changes to pt's appts per 2/1 los. Pt confirmed appt changes and new arrival time.

## 2020-06-20 ENCOUNTER — Other Ambulatory Visit: Payer: Self-pay

## 2020-06-20 ENCOUNTER — Inpatient Hospital Stay: Payer: 59

## 2020-06-20 ENCOUNTER — Ambulatory Visit: Payer: 59 | Admitting: Oncology

## 2020-06-20 ENCOUNTER — Ambulatory Visit: Payer: 59

## 2020-06-20 VITALS — BP 160/74 | HR 82 | Temp 98.8°F | Resp 16

## 2020-06-20 DIAGNOSIS — R978 Other abnormal tumor markers: Secondary | ICD-10-CM | POA: Diagnosis not present

## 2020-06-20 DIAGNOSIS — C50012 Malignant neoplasm of nipple and areola, left female breast: Secondary | ICD-10-CM

## 2020-06-20 DIAGNOSIS — C50011 Malignant neoplasm of nipple and areola, right female breast: Secondary | ICD-10-CM

## 2020-06-20 DIAGNOSIS — Z170 Estrogen receptor positive status [ER+]: Secondary | ICD-10-CM

## 2020-06-20 DIAGNOSIS — Z975 Presence of (intrauterine) contraceptive device: Secondary | ICD-10-CM | POA: Diagnosis not present

## 2020-06-20 DIAGNOSIS — Z9013 Acquired absence of bilateral breasts and nipples: Secondary | ICD-10-CM | POA: Diagnosis not present

## 2020-06-20 DIAGNOSIS — C7951 Secondary malignant neoplasm of bone: Secondary | ICD-10-CM | POA: Diagnosis not present

## 2020-06-20 DIAGNOSIS — Z79899 Other long term (current) drug therapy: Secondary | ICD-10-CM | POA: Diagnosis not present

## 2020-06-20 DIAGNOSIS — C787 Secondary malignant neoplasm of liver and intrahepatic bile duct: Secondary | ICD-10-CM | POA: Diagnosis not present

## 2020-06-20 DIAGNOSIS — C50211 Malignant neoplasm of upper-inner quadrant of right female breast: Secondary | ICD-10-CM

## 2020-06-20 DIAGNOSIS — M545 Low back pain, unspecified: Secondary | ICD-10-CM | POA: Diagnosis not present

## 2020-06-20 DIAGNOSIS — C50112 Malignant neoplasm of central portion of left female breast: Secondary | ICD-10-CM

## 2020-06-20 LAB — COMPREHENSIVE METABOLIC PANEL
ALT: 15 U/L (ref 0–44)
AST: 18 U/L (ref 15–41)
Albumin: 3.4 g/dL — ABNORMAL LOW (ref 3.5–5.0)
Alkaline Phosphatase: 145 U/L — ABNORMAL HIGH (ref 38–126)
Anion gap: 7 (ref 5–15)
BUN: 8 mg/dL (ref 6–20)
CO2: 28 mmol/L (ref 22–32)
Calcium: 9.4 mg/dL (ref 8.9–10.3)
Chloride: 105 mmol/L (ref 98–111)
Creatinine, Ser: 0.81 mg/dL (ref 0.44–1.00)
GFR, Estimated: >60 mL/min (ref >60)
Glucose, Bld: 99 mg/dL (ref 70–99)
Potassium: 4.2 mmol/L (ref 3.5–5.1)
Sodium: 140 mmol/L (ref 135–145)
Total Bilirubin: 0.4 mg/dL (ref 0.3–1.2)
Total Protein: 7.4 g/dL (ref 6.5–8.1)

## 2020-06-20 LAB — CBC WITH DIFFERENTIAL/PLATELET
Abs Immature Granulocytes: 0.01 10*3/uL (ref 0.00–0.07)
Basophils Absolute: 0 10*3/uL (ref 0.0–0.1)
Basophils Relative: 0 %
Eosinophils Absolute: 0.2 10*3/uL (ref 0.0–0.5)
Eosinophils Relative: 3 %
HCT: 38.1 % (ref 36.0–46.0)
Hemoglobin: 12.1 g/dL (ref 12.0–15.0)
Immature Granulocytes: 0 %
Lymphocytes Relative: 11 %
Lymphs Abs: 0.6 10*3/uL — ABNORMAL LOW (ref 0.7–4.0)
MCH: 29.2 pg (ref 26.0–34.0)
MCHC: 31.8 g/dL (ref 30.0–36.0)
MCV: 92 fL (ref 80.0–100.0)
Monocytes Absolute: 0.4 10*3/uL (ref 0.1–1.0)
Monocytes Relative: 8 %
Neutro Abs: 4.1 10*3/uL (ref 1.7–7.7)
Neutrophils Relative %: 78 %
Platelets: 255 10*3/uL (ref 150–400)
RBC: 4.14 MIL/uL (ref 3.87–5.11)
RDW: 18.2 % — ABNORMAL HIGH (ref 11.5–15.5)
WBC: 5.3 10*3/uL (ref 4.0–10.5)
nRBC: 0 % (ref 0.0–0.2)

## 2020-06-20 MED ORDER — DENOSUMAB 120 MG/1.7ML ~~LOC~~ SOLN
SUBCUTANEOUS | Status: AC
  Filled 2020-06-20: qty 1.7

## 2020-06-20 MED ORDER — DENOSUMAB 120 MG/1.7ML ~~LOC~~ SOLN
120.0000 mg | Freq: Once | SUBCUTANEOUS | Status: AC
  Administered 2020-06-20: 120 mg via SUBCUTANEOUS

## 2020-06-20 MED FILL — ANASTROZOLE 1 MG TABLET: 1 | 90 days supply | Qty: 90 | Fill #0

## 2020-06-20 NOTE — Patient Instructions (Signed)
Denosumab injection What is this medicine? DENOSUMAB (den oh sue mab) slows bone breakdown. Prolia is used to treat osteoporosis in women after menopause and in men, and in people who are taking corticosteroids for 6 months or more. Xgeva is used to treat a high calcium level due to cancer and to prevent bone fractures and other bone problems caused by multiple myeloma or cancer bone metastases. Xgeva is also used to treat giant cell tumor of the bone. This medicine may be used for other purposes; ask your health care provider or pharmacist if you have questions. COMMON BRAND NAME(S): Prolia, XGEVA What should I tell my health care provider before I take this medicine? They need to know if you have any of these conditions:  dental disease  having surgery or tooth extraction  infection  kidney disease  low levels of calcium or Vitamin D in the blood  malnutrition  on hemodialysis  skin conditions or sensitivity  thyroid or parathyroid disease  an unusual reaction to denosumab, other medicines, foods, dyes, or preservatives  pregnant or trying to get pregnant  breast-feeding How should I use this medicine? This medicine is for injection under the skin. It is given by a health care professional in a hospital or clinic setting. A special MedGuide will be given to you before each treatment. Be sure to read this information carefully each time. For Prolia, talk to your pediatrician regarding the use of this medicine in children. Special care may be needed. For Xgeva, talk to your pediatrician regarding the use of this medicine in children. While this drug may be prescribed for children as young as 13 years for selected conditions, precautions do apply. Overdosage: If you think you have taken too much of this medicine contact a poison control center or emergency room at once. NOTE: This medicine is only for you. Do not share this medicine with others. What if I miss a dose? It is  important not to miss your dose. Call your doctor or health care professional if you are unable to keep an appointment. What may interact with this medicine? Do not take this medicine with any of the following medications:  other medicines containing denosumab This medicine may also interact with the following medications:  medicines that lower your chance of fighting infection  steroid medicines like prednisone or cortisone This list may not describe all possible interactions. Give your health care provider a list of all the medicines, herbs, non-prescription drugs, or dietary supplements you use. Also tell them if you smoke, drink alcohol, or use illegal drugs. Some items may interact with your medicine. What should I watch for while using this medicine? Visit your doctor or health care professional for regular checks on your progress. Your doctor or health care professional may order blood tests and other tests to see how you are doing. Call your doctor or health care professional for advice if you get a fever, chills or sore throat, or other symptoms of a cold or flu. Do not treat yourself. This drug may decrease your body's ability to fight infection. Try to avoid being around people who are sick. You should make sure you get enough calcium and vitamin D while you are taking this medicine, unless your doctor tells you not to. Discuss the foods you eat and the vitamins you take with your health care professional. See your dentist regularly. Brush and floss your teeth as directed. Before you have any dental work done, tell your dentist you are   receiving this medicine. Do not become pregnant while taking this medicine or for 5 months after stopping it. Talk with your doctor or health care professional about your birth control options while taking this medicine. Women should inform their doctor if they wish to become pregnant or think they might be pregnant. There is a potential for serious side  effects to an unborn child. Talk to your health care professional or pharmacist for more information. What side effects may I notice from receiving this medicine? Side effects that you should report to your doctor or health care professional as soon as possible:  allergic reactions like skin rash, itching or hives, swelling of the face, lips, or tongue  bone pain  breathing problems  dizziness  jaw pain, especially after dental work  redness, blistering, peeling of the skin  signs and symptoms of infection like fever or chills; cough; sore throat; pain or trouble passing urine  signs of low calcium like fast heartbeat, muscle cramps or muscle pain; pain, tingling, numbness in the hands or feet; seizures  unusual bleeding or bruising  unusually weak or tired Side effects that usually do not require medical attention (report to your doctor or health care professional if they continue or are bothersome):  constipation  diarrhea  headache  joint pain  loss of appetite  muscle pain  runny nose  tiredness  upset stomach This list may not describe all possible side effects. Call your doctor for medical advice about side effects. You may report side effects to FDA at 1-800-FDA-1088. Where should I keep my medicine? This medicine is only given in a clinic, doctor's office, or other health care setting and will not be stored at home. NOTE: This sheet is a summary. It may not cover all possible information. If you have questions about this medicine, talk to your doctor, pharmacist, or health care provider.  2021 [employer] (2017-09-03 16:10:44)

## 2020-06-21 ENCOUNTER — Other Ambulatory Visit (HOSPITAL_COMMUNITY): Payer: Self-pay | Admitting: Family Medicine

## 2020-06-21 DIAGNOSIS — D0510 Intraductal carcinoma in situ of unspecified breast: Secondary | ICD-10-CM | POA: Diagnosis not present

## 2020-06-21 DIAGNOSIS — E7800 Pure hypercholesterolemia, unspecified: Secondary | ICD-10-CM | POA: Diagnosis not present

## 2020-06-21 DIAGNOSIS — Z0000 Encounter for general adult medical examination without abnormal findings: Secondary | ICD-10-CM | POA: Diagnosis not present

## 2020-06-21 DIAGNOSIS — E1169 Type 2 diabetes mellitus with other specified complication: Secondary | ICD-10-CM | POA: Diagnosis not present

## 2020-06-21 DIAGNOSIS — E0500 Thyrotoxicosis with diffuse goiter without thyrotoxic crisis or storm: Secondary | ICD-10-CM | POA: Diagnosis not present

## 2020-06-21 DIAGNOSIS — Z23 Encounter for immunization: Secondary | ICD-10-CM | POA: Diagnosis not present

## 2020-06-21 DIAGNOSIS — C787 Secondary malignant neoplasm of liver and intrahepatic bile duct: Secondary | ICD-10-CM | POA: Diagnosis not present

## 2020-06-21 DIAGNOSIS — I10 Essential (primary) hypertension: Secondary | ICD-10-CM | POA: Diagnosis not present

## 2020-06-21 DIAGNOSIS — F39 Unspecified mood [affective] disorder: Secondary | ICD-10-CM | POA: Diagnosis not present

## 2020-06-21 LAB — CANCER ANTIGEN 27.29: CA 27.29: 76.3 U/mL — ABNORMAL HIGH (ref 0.0–38.6)

## 2020-06-21 MED FILL — LORAZEPAM 1 MG TABS: 1 | 30 days supply | Qty: 30 | Fill #0

## 2020-06-24 ENCOUNTER — Other Ambulatory Visit: Payer: Self-pay | Admitting: Oncology

## 2020-06-24 ENCOUNTER — Telehealth: Payer: Self-pay

## 2020-06-24 NOTE — Telephone Encounter (Signed)
Contacted pt to inform her to stop taking her Anastrozole and continue taking Xeloda. Pt verbalized understanding.

## 2020-07-01 MED FILL — CAPECITABINE 500 MG TABLET: 500 | 21 days supply | Qty: 84 | Fill #2

## 2020-07-11 MED FILL — ROSUVASTATIN CALCIUM 5 MG T: 5 | 60 days supply | Qty: 60 | Fill #1

## 2020-07-12 MED FILL — SERTRALINE HCL 100 MG TABS: 100 | 30 days supply | Qty: 30 | Fill #11

## 2020-07-13 ENCOUNTER — Other Ambulatory Visit: Payer: Self-pay | Admitting: Oncology

## 2020-07-16 ENCOUNTER — Encounter: Payer: Self-pay | Admitting: Oncology

## 2020-07-16 MED FILL — PREGABALIN 100 MG CAPS: 100 | 30 days supply | Qty: 90 | Fill #0

## 2020-07-16 MED FILL — CAPECITABINE 500 MG TABLET: 500 | 21 days supply | Qty: 84 | Fill #3

## 2020-07-17 ENCOUNTER — Other Ambulatory Visit (HOSPITAL_COMMUNITY): Payer: Self-pay | Admitting: Family Medicine

## 2020-07-17 NOTE — Progress Notes (Signed)
[place]  [phone] [phone]    ID: EMYAH ROZNOWSKI DOB: 1965/06/25  MR#: [id number]  OMV#:[id number]  Patient Care Team: Kelton Pillar, MD as PCP - General (Family Medicine) Jacelyn Pi, MD as Consulting Physician (Endocrinology) Jovita Kussmaul, MD as Consulting Physician (General Surgery) Duwayne Matters, Virgie Dad, MD as Consulting Physician (Oncology) [name], MD as Attending Physician (Radiation Oncology) Belva Crome, MD as Consulting Physician (Cardiology) Crissie Reese, MD as Consulting Physician (Plastic Surgery) [name], [name], NP as Nurse Practitioner (Hematology and Oncology) Lajoyce Lauber, DMD (Dentistry) OTHER MD:   CHIEF COMPLAINT: Estrogen receptor positive stage IV  breast cancer (s/p bilateral mastectomies)  CURRENT TREATMENT: Capecitabine/Xeloda; denosumab/Xgeva   INTERVAL HISTORY: Alitza returns today for follow-up of her estrogen receptor positive stage IV breast cancer.  She was switched to capecitabine on 05/21/2020.  She is currently completing her third cycle.  She thinks the palms of her hands and soles of her feet are little drier and slightly redder but there have been no cracks or pain and no peripheral neuropathy other than the numbness she had previously in her toes which has not changed.  She has had occasional loose bowel movements but no frank diarrhea, and no mouth sores.  She continues on Xgeva every 4 weeks, with her most recent dose on 06/20/2020.  She has no side effects from this that she is aware of  Lab Results  Component Value Date   CA2729 76.3 (H) 06/20/2020   CA2729 63.2 (H) 05/21/2020   CA2729 57.1 (H) 04/25/2020   CA2729 48.6 (H) 03/28/2020   CA2729 49.2 (H) 02/29/2020    REVIEW OF SYSTEMS: Diondra continues to work full-time.  A detailed review of systems is otherwise stable   COVID 19 VACCINATION STATUS: Status post Pfizer x2, with booster January  2021   BREAST CANCER HISTORY: From the original intake note:  Taia had screening mammography with tomosynthesis at the [facility] 10/02/2015. This showed 2 possible masses in the right breast as well as some suspicious calcifications. The left breast was unremarkable. On 10/15/2015 she underwent right diagnostic mammography with tomography and right breast ultrasonography. The breast density was category B. Spot magnification views confirmed an area of pleomorphic calcifications measuring up to 5 cm in the upper half of the breast. Also in the upper outer right breast there were adjacent low-density masses without distortion or calcifications. On physical exam there was no palpable abnormality.  Targeted ultrasound of the right breast found the 2 "masses" in the right upper quadrant were benign cysts measuring 0.8 and 0.5 cm respectively. Biopsy of the area of calcification on 10/15/2015 showed ([id number]) invasive ductal carcinoma, E-cadherin positive, grade 2, estrogen receptor 90% positive, progesterone receptor 50% positive, both with strong staining intensity, with an MIB-1 of 70%, and no HER-2 amplification, the signals ratio being 1.18 and the number per cell 2.60.  Her subsequent history is as detailed below    PAST MEDICAL HISTORY: Past Medical History:  Diagnosis Date  . Anxiety   . Arthritis   . Bone metastasis ([place])   . Breast cancer of upper-inner quadrant of right female breast [facility]) oncologist-  dr [name]--- 04/ 2019 ER/PR+ Stage IV w/ metastatic disease to liver, total spine, lung, bone   dx 06/ 2017  right breast invasive ductal carcinoma, Stage IIB, ER/PR + (pT2 pN1), grade 1-- s/p  right breast lumpecotmy w/ sln bx's and bilateral breast reduction 11-28-2015/  left breast with reduction ,  dx invasive ductal ca, Grade 1 (pT1cpNX) ER/PR positive/  adjuvant radiation completed 03-27-2016 to right chest wall, 2018  reclassifiec Stage IIA  . Cancer, metastatic to liver  ([place])   . Chronic back pain   . Constipation   . Depression   . Family history of adverse reaction to anesthesia     mother has Copd was kept on vent   . Headache   . Heart murmur   . History of hyperthyroidism    s/p  RAI  . History of radiation therapy 02/11/16- 03/27/16   Right Chest Wall 50.4 Gy in 28 fractions, Right Chest Wall Boost 10 Gy in 5 fractions.   . Hyperlipidemia   . Hypertension   . Hypothyroidism, postradioiodine therapy    endocrinoloigst-  dr balan--  2004  s/p  RAI  . Menorrhagia 2011  . Multiple pulmonary nodules    secondary  metastatic  . PONV (postoperative nausea and vomiting)    patch helps   . SUI (stress urinary incontinence, female)   . Wears contact lenses   . Wears dentures    full upper and lower partial    PAST SURGICAL HISTORY: Past Surgical History:  Procedure Laterality Date  . BREAST LUMPECTOMY WITH NEEDLE LOCALIZATION AND AXILLARY SENTINEL LYMPH NODE BX Right 11/28/2015   Procedure: RIGHT BREAST LUMPECTOMY WITH NEEDLE LOCALIZATION AND AXILLARY SENTINEL LYMPH NODE BX;  Surgeon: Autumn Messing III, MD;  Location: [place];  Service: General;  Laterality: Right;  . BREAST REDUCTION SURGERY Bilateral 11/28/2015   Procedure: MAMMARY REDUCTION  (BREAST) BILATERAL;  Surgeon: Crissie Reese, MD;  Location: [place];  Service: Plastics;  Laterality: Bilateral;  . CESAREAN SECTION  01/12/1993  . COLONOSCOPY    . Dental surgeries    . FEMUR IM NAIL Left 01/28/2018   Procedure: INTRAMEDULLARY (IM) NAIL FEMORAL;  Surgeon: Nicholes Stairs, MD;  Location: [place];  Service: Orthopedics;  Laterality: Left;  . HYSTEROSCOPY N/A 09/02/2017   Procedure: HYSTEROSCOPY  to remove IUD;  Surgeon: Eldred Manges, MD;  Location[facility];  Service: Gynecology;  Laterality: N/A;  . Liver biospy    . MASTECTOMY    . MASTECTOMY W/ SENTINEL NODE BIOPSY Bilateral 12/26/2015   Procedure: BILATERAL MASTECTOMY WITH LEFT SENTINEL LYMPH NODE BIOPSY;  Surgeon: Autumn Messing III, MD;  Location: [place];  Service: General;  Laterality: Bilateral;  . Removal of IUD    . TUBAL LIGATION Bilateral 1995    FAMILY HISTORY: Family History  Problem Relation Age of Onset  . Diabetes Mother   . Hypertension Mother   . Hypertension Father   . Diabetes Maternal Grandmother   . Hypertension Sister   . Cancer Paternal Grandmother        colon  . Colon cancer Paternal Grandmother   . Esophageal cancer Neg Hx   . Stomach cancer Neg Hx   . Rectal cancer Neg Hx   The patient's parents are still living, in their early 37s as of June 2017. The patient has one brother, 2 sisters. On the maternal side there is a history of uterine and prostate cancer. On the paternal side there is a history of colon cancer and possibly uterine cancer. There is no history of breast or ovarian cancer in the family.    GYNECOLOGIC HISTORY:  No LMP recorded. (Menstrual status: IUD).  Menarche age 36. She still having regular periods. She is GX P1, first pregnancy age 44. She used oral contraceptives for a  few years remotely, with no complications. She is currently on a Mirena IUD and also is status post bilateral tubal ligation.   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 Her husband [name] is a Administrator.  They are both taking appropriate precautions at work.  Their son [name], age 69, is a radio DJ.  He no longer lives with the patient.  The patient has no grandchildren. She is not a Ambulance person.   ADVANCED DIRECTIVES: In the absence of any documents to the contrary her husband is her healthcare power of attorney   HEALTH MAINTENANCE: Social History   Tobacco Use  . Smoking status: Never Smoker  . Smokeless tobacco: Never Used  Vaping Use  . Vaping Use: Never used  Substance Use Topics  . Alcohol use: No  . Drug use: No     Colonoscopy: January 2016   PAP: January 2016   Bone density: August 2017 showed a T score of +0.7  normal  Lipid panel:  Allergies  Allergen Reactions  . Ace Inhibitors Cough  . Atorvastatin Other (See Comments)    Joint pain  . Simvastatin Other (See Comments)    Joint pain    Current Outpatient Medications  Medication Sig Dispense Refill  . blood glucose meter kit and supplies KIT Dispense based on patient and insurance preference. Use up to four times daily as directed. (FOR ICD-9 250.00, 250.01). 1 each 0  . Calcium-Magnesium 250-125 MG TABS Take 1 tablet by mouth daily. 120 each 4  . capecitabine (XELODA) 500 MG tablet Take 3 tablets (1,500 mg total) by mouth 2 (two) times daily after a meal. Take for 14 days on, 7 days off, repeat every 21 days. To start 05/22/2020 84 tablet 6  . cetirizine (ZYRTEC ALLERGY) 10 MG tablet Take 10 mg by mouth daily as needed for allergies.     . chlorhexidine (PERIDEX) 0.12 % solution     . Ferrous Sulfate (IRON) 325 (65 Fe) MG TABS Take 1 tablet (325 mg total) by mouth daily. 30 each 2  . fluconazole (DIFLUCAN) 100 MG tablet 2 tablets first dose then 1 tab daily 8 tablet 0  . FREESTYLE LITE test strip USE AS DIRECTED TO TEST UP TO FOUR TIMES DAILY 100 strip 0  . furosemide (LASIX) 20 MG tablet Take 20 mg by mouth daily as needed.    Marland Kitchen ibuprofen (ADVIL) 800 MG tablet Take 800 mg by mouth every 6 (six) hours as needed.    . Lancets (ONETOUCH ULTRASOFT) lancets Use as instructed 100 each 12  . levothyroxine (SYNTHROID, LEVOTHROID) 137 MCG tablet Take 137 mcg by mouth daily before breakfast.     . LORazepam (ATIVAN) 0.5 MG tablet Take 1 tablet (0.5 mg total) by mouth at bedtime as needed for anxiety. 30 tablet 0  . losartan-hydrochlorothiazide (HYZAAR) 100-25 MG tablet Take 1 tablet by mouth daily.    . Multiple Vitamin (MULTIVITAMIN) tablet Take 1 tablet by mouth daily.    Marland Kitchen omega-3 acid ethyl esters (LOVAZA) 1 g capsule Take 1 g by mouth 2 (two) times daily.     . potassium chloride SA (K-DUR,KLOR-CON) 20 MEQ tablet Take 40 mEq by mouth daily.      . pregabalin (LYRICA) 100 MG capsule TAKE 1 CAPSULE BY MOUTH 3 TIMES A DAY 90 capsule 0  . prochlorperazine (COMPAZINE) 10 MG tablet Take 1 tablet (10 mg total) by mouth every 6 (six) hours as needed for  nausea or vomiting. 30 tablet 0  . prochlorperazine (COMPAZINE) 10 MG tablet Take 1 tablet (10 mg total) by mouth every 6 (six) hours as needed for nausea or vomiting. 30 tablet 0  . rosuvastatin (CRESTOR) 5 MG tablet Take 1 tablet (5 mg total) by mouth daily.    . sertraline (ZOLOFT) 100 MG tablet Take 1 tablet (100 mg total) by mouth daily.    . traMADol (ULTRAM) 50 MG tablet Take 50 mg by mouth every 6 (six) hours as needed.    . triamterene-hydrochlorothiazide (DYAZIDE) 37.5-25 MG capsule TAKE 1 CAPSULE BY MOUTH ONCE DAILY 90 capsule 0  . Vitamin D, Cholecalciferol, 1000 units CAPS Take 1 tablet by mouth daily. (Patient taking differently: Take 1,000 Units by mouth daily. ) 90 capsule 4   No current facility-administered medications for this visit.    OBJECTIVE: African-American woman who appears stated age  6:   07/18/20 1213  BP: (!) 141/70  Pulse: 91  Resp: 18  Temp: (!) 97.2 F (36.2 C)  SpO2: 93%     Body mass index is 42.31 kg/m.    ECOG FS:1 - Symptomatic but completely ambulatory  Filed Weights   07/18/20 1213  Weight: 266 lb 1.6 oz (120.7 kg)     Sclerae unicteric, EOMs intact Wearing a mask No cervical or supraclavicular adenopathy Lungs no rales or rhonchi Heart regular rate and rhythm Abd soft, nontender, positive bowel sounds MSK no focal spinal tenderness, no upper extremity lymphedema Neuro: nonfocal, well oriented, appropriate affect Breasts: Status post bilateral mastectomies.  There is no evidence of chest wall recurrence.  Both axillae are benign.   LAB RESULTS:  CMP     Component Value Date/Time   NA 138 07/18/2020 1159   NA 140 07/08/2016 0948   K 3.7 07/18/2020 1159   K 4.1 07/08/2016 0948   CL 104 07/18/2020 1159   CO2 27 07/18/2020  1159   CO2 24 07/08/2016 0948   GLUCOSE 89 07/18/2020 1159   GLUCOSE 89 07/08/2016 0948   BUN 11 07/18/2020 1159   BUN 11.9 07/08/2016 0948   CREATININE 0.88 07/18/2020 1159   CREATININE 0.77 05/16/2019 0905   CREATININE 0.9 07/08/2016 0948   CALCIUM 9.6 07/18/2020 1159   CALCIUM 10.0 07/08/2016 0948   PROT 7.4 07/18/2020 1159   PROT 7.6 07/08/2016 0948   ALBUMIN 3.4 (L) 07/18/2020 1159   ALBUMIN 3.6 07/08/2016 0948   AST 14 (L) 07/18/2020 1159   AST 35 05/16/2019 0905   AST 22 07/08/2016 0948   ALT 12 07/18/2020 1159   ALT 40 05/16/2019 0905   ALT 27 07/08/2016 0948   ALKPHOS 137 (H) 07/18/2020 1159   ALKPHOS 115 07/08/2016 0948   BILITOT 0.4 07/18/2020 1159   BILITOT 0.7 05/16/2019 0905   BILITOT 0.35 07/08/2016 0948   GFRNONAA >60 07/18/2020 1159   GFRNONAA >60 05/16/2019 0905   GFRAA >60 01/30/2020 0814   GFRAA >60 05/16/2019 0905    INo results found for: SPEP, UPEP  Lab Results  Component Value Date   WBC 5.4 07/18/2020   NEUTROABS 3.9 07/18/2020   HGB 12.1 07/18/2020   HCT 37.2 07/18/2020   MCV 91.9 07/18/2020   PLT 271 07/18/2020      Chemistry      Component Value Date/Time   NA 138 07/18/2020 1159   NA 140 07/08/2016 0948   K 3.7 07/18/2020 1159   K 4.1 07/08/2016 0948   CL 104 07/18/2020 1159   CO2  27 07/18/2020 1159   CO2 24 07/08/2016 0948   BUN 11 07/18/2020 1159   BUN 11.9 07/08/2016 0948   CREATININE 0.88 07/18/2020 1159   CREATININE 0.77 05/16/2019 0905   CREATININE 0.9 07/08/2016 0948      Component Value Date/Time   CALCIUM 9.6 07/18/2020 1159   CALCIUM 10.0 07/08/2016 0948   ALKPHOS 137 (H) 07/18/2020 1159   ALKPHOS 115 07/08/2016 0948   AST 14 (L) 07/18/2020 1159   AST 35 05/16/2019 0905   AST 22 07/08/2016 0948   ALT 12 07/18/2020 1159   ALT 40 05/16/2019 0905   ALT 27 07/08/2016 0948   BILITOT 0.4 07/18/2020 1159   BILITOT 0.7 05/16/2019 0905   BILITOT 0.35 07/08/2016 0948       No results found for: LABCA2  No  components found for: LABCA125  No results for input(s): INR in the last 168 hours.  Urinalysis No results found for: COLORURINE, APPEARANCEUR, LABSPEC, PHURINE, GLUCOSEU, HGBUR, BILIRUBINUR, KETONESUR, PROTEINUR, UROBILINOGEN, NITRITE, LEUKOCYTESUR   STUDIES: No results found.   ELIGIBLE FOR AVAILABLE RESEARCH PROTOCOL: PALLAS, but inelegible because of Mirena IUD   ASSESSMENT: 55 y.o. [place] woman status post right breast upper inner quadrant biopsy 10/15/2015 for a clinical T2-T3 N0, stage II invasive ductal carcinoma, estrogen and progesterone receptor positive, HER-2 nonamplified, with an Mib-1 of 70%  (1) status post right lumpectomy and sentinel lymph node sampling with oncoplastic breast reduction 11/28/2015 for a pT2 pN1, stage IIB invasive ductal carcinoma, grade 1, with negative margins (1/1 sentinel node positive)  (a) reclassified as stage IIA in the 2018 prognostic revision   (2) left reduction mammoplasty 11/28/2015 unexpectedly found a pT1c pNX invasive ductal carcinoma, grade 1, estrogen and progesterone receptor positive, HER-2 not amplified, with an MIB-1 of 3%  (3) Mammaprint from the right sided breast cancer was "low risk", predicting a 98% chance of no disease recurrence within 5 years with anti-estrogens as the only systemic therapy. It also predicts minimal to no benefit from chemotherapy  (4) status post bilateral mastectomies with bilateral sentinel lymph node sampling 12/26/2015, showing  (a) on the right, no residual carcinoma (0/3 nodes involved)  (b) on the left, focal ductal carcinoma in situ, 0.2 cm, with negative margins; (0/4 nodes involved)  (5) adjuvant radiation 02/11/16 - 03/27/16    1) Right chest wall: 50.4 Gy in 28 fractions               2) Right chest wall boost: 10 Gy in 5 fractions   (6) started on tamoxifen neoadjuvantly 10/23/2015 to allow for expected delays in definitive local treatment  (a) Mirena IUD in place  (b) tamoxifen  discontinued 08/13/2017, with progression  (7) since she had 4 lymph nodes removed from each axilla but also receive radiation on the right, lab draws should preferentially be obtained from the left arm   METASTATIC DISEASE: April 2019 (8) CT scan of the abdomen and pelvis 08/04/2017, MRI of the liver and total spine MRI 08/11/2017 and CT scan of the chest and brain on 08/13/2017 showed liver and bone metastases  (a) liver biopsy 09/07/2017 confirms estrogen receptor positive, progesterone receptor negative, HER-2 negative metastatic breast cancer  (b) chest CT scan 08/13/2017 shows multiple bilateral pulmonary nodules.  (c) CT of the brain with and without contrast 08/26/2017 shows no evidence of metastatic disease to the brain  (d) CA 27-29 is informative  (9) fulvestrant started 08/17/2017  (a) palbociclib started 08/31/2017  (b) fulvestrant and palbociclib  discontinued 01/04/2018 with evidence of progression in the liver  (10) denosumab/Xgeva started 09/14/2017  (a) held after 03/01/2018 dose because concern re. osteonecrosis  (b) resumed 06/09/2018  (c) 02/21/2019 dose held due to oral surgery, resumed 03/21/2019  (11) anastrozole started 01/04/2018  (a) everolimus started 01/29/2018  (b) liver MRI and bone scan December 2019 show stable disease  (c) chest CT scan on 08/02/2018 shows lung lesions resolved, bone lesions stable  (d) bone scan 09/13/2018 essentially stable  (e) liver MRI 12/08/2018 shows improvement in hepatic metastsases  (f) liver MRI and bone scan 04/13/2019 showed continuing improvement in the liver, stable bone lesions  (g) chest CT 08/01/2019 stable, no evidence of active disease ows  (12) 01/28/18 prophylactic left femur intramedullary nail surgery for impending left femur pathologic fracture  (a) palliative radiation with Dr. [name] to the left femur and left ilium from 02/16/2018 to 03/01/2018  (13) [facility] report on liver biopsy from April 2019 shows a  negative PDL 1, stable MSI and proficient mismatch repair status.  The tumor is positive for the androgen receptor, and for PTEN for PIK3 CA mutations.  HER-2/neu was read as equivocal (it was negative by FISH on the pathology report).  (14) Progression noted in liver MRI on 01/11/2020; fulvestrant started on 01/30/2020  (a) alpelisib is started 01/30/2020 at 300 mg daily with Metformin 500 mg daily  (b) alpelisib held on 02/07/2020 with very high blood sugars.  Metformin increased to 1 g daily  (c) alpelisib resumed at 150 mg daily on 02/13/2020  (d) liver MRI 05/06/2020 shows evidence of disease progression  (e) fulvestrant and alpelisib discontinued January 2022  (15) capecitabine started 05/22/2020 at 1.5 g twice daily, 14/7   PLAN: Markiyah is tolerating capecitabine generally well.  The slight drift upward of the CA 27-29 is of concern.  In any case she will begin her fourth cycle on 08/14/2020, and she will see me 08/22/2020.  Before that she will have a repeat liver MRI to assess  Incidentally her treatment after cycle 4 will be slightly delayed secondary to a family trip.  She knows to call for any other issue that may develop before the next visit  Total encounter time 20 minutes.  Virgie Dad. Kasia Trego, MD 07/18/20 5:02 PM Medical Oncology and Hematology [facility] [place], [place] [address] Tel. [phone]    Fax. [phone]   I, Wilburn Mylar, am acting as scribe for Dr. Virgie Dad. Mizani Dilday.  I, Lurline Del MD, have reviewed the above documentation for accuracy and completeness, and I agree with the above.   *Total Encounter Time as defined by the Centers for Medicare and Medicaid Services includes, in addition to the face-to-face time of a patient visit (documented in the note above) non-face-to-face time: obtaining and reviewing outside history, ordering and reviewing medications, tests or procedures, care coordination (communications  with other health care professionals or caregivers) and documentation in the medical record.

## 2020-07-18 ENCOUNTER — Inpatient Hospital Stay (HOSPITAL_BASED_OUTPATIENT_CLINIC_OR_DEPARTMENT_OTHER): Payer: 59 | Admitting: Oncology

## 2020-07-18 ENCOUNTER — Other Ambulatory Visit: Payer: Self-pay

## 2020-07-18 ENCOUNTER — Ambulatory Visit: Payer: 59

## 2020-07-18 ENCOUNTER — Other Ambulatory Visit: Payer: 59

## 2020-07-18 ENCOUNTER — Inpatient Hospital Stay: Payer: 59 | Attending: Oncology

## 2020-07-18 ENCOUNTER — Inpatient Hospital Stay: Payer: 59

## 2020-07-18 VITALS — BP 141/70 | HR 91 | Temp 97.2°F | Resp 18 | Ht 66.5 in | Wt 266.1 lb

## 2020-07-18 DIAGNOSIS — C50112 Malignant neoplasm of central portion of left female breast: Secondary | ICD-10-CM

## 2020-07-18 DIAGNOSIS — C50211 Malignant neoplasm of upper-inner quadrant of right female breast: Secondary | ICD-10-CM

## 2020-07-18 DIAGNOSIS — Z170 Estrogen receptor positive status [ER+]: Secondary | ICD-10-CM

## 2020-07-18 DIAGNOSIS — C787 Secondary malignant neoplasm of liver and intrahepatic bile duct: Secondary | ICD-10-CM | POA: Insufficient documentation

## 2020-07-18 DIAGNOSIS — C7951 Secondary malignant neoplasm of bone: Secondary | ICD-10-CM | POA: Diagnosis not present

## 2020-07-18 DIAGNOSIS — C50012 Malignant neoplasm of nipple and areola, left female breast: Secondary | ICD-10-CM

## 2020-07-18 DIAGNOSIS — C50011 Malignant neoplasm of nipple and areola, right female breast: Secondary | ICD-10-CM

## 2020-07-18 LAB — CBC WITH DIFFERENTIAL/PLATELET
Abs Immature Granulocytes: 0.03 10*3/uL (ref 0.00–0.07)
Basophils Absolute: 0 10*3/uL (ref 0.0–0.1)
Basophils Relative: 1 %
Eosinophils Absolute: 0.2 10*3/uL (ref 0.0–0.5)
Eosinophils Relative: 3 %
HCT: 37.2 % (ref 36.0–46.0)
Hemoglobin: 12.1 g/dL (ref 12.0–15.0)
Immature Granulocytes: 1 %
Lymphocytes Relative: 15 %
Lymphs Abs: 0.8 10*3/uL (ref 0.7–4.0)
MCH: 29.9 pg (ref 26.0–34.0)
MCHC: 32.5 g/dL (ref 30.0–36.0)
MCV: 91.9 fL (ref 80.0–100.0)
Monocytes Absolute: 0.5 10*3/uL (ref 0.1–1.0)
Monocytes Relative: 9 %
Neutro Abs: 3.9 10*3/uL (ref 1.7–7.7)
Neutrophils Relative %: 71 %
Platelets: 271 10*3/uL (ref 150–400)
RBC: 4.05 MIL/uL (ref 3.87–5.11)
RDW: 18.4 % — ABNORMAL HIGH (ref 11.5–15.5)
WBC: 5.4 10*3/uL (ref 4.0–10.5)
nRBC: 0 % (ref 0.0–0.2)

## 2020-07-18 LAB — COMPREHENSIVE METABOLIC PANEL
ALT: 12 U/L (ref 0–44)
AST: 14 U/L — ABNORMAL LOW (ref 15–41)
Albumin: 3.4 g/dL — ABNORMAL LOW (ref 3.5–5.0)
Alkaline Phosphatase: 137 U/L — ABNORMAL HIGH (ref 38–126)
Anion gap: 7 (ref 5–15)
BUN: 11 mg/dL (ref 6–20)
CO2: 27 mmol/L (ref 22–32)
Calcium: 9.6 mg/dL (ref 8.9–10.3)
Chloride: 104 mmol/L (ref 98–111)
Creatinine, Ser: 0.88 mg/dL (ref 0.44–1.00)
GFR, Estimated: >60 mL/min (ref >60)
Glucose, Bld: 89 mg/dL (ref 70–99)
Potassium: 3.7 mmol/L (ref 3.5–5.1)
Sodium: 138 mmol/L (ref 135–145)
Total Bilirubin: 0.4 mg/dL (ref 0.3–1.2)
Total Protein: 7.4 g/dL (ref 6.5–8.1)

## 2020-07-18 MED ORDER — DENOSUMAB 120 MG/1.7ML ~~LOC~~ SOLN
SUBCUTANEOUS | Status: AC
  Filled 2020-07-18: qty 1.7

## 2020-07-18 MED ORDER — DENOSUMAB 120 MG/1.7ML ~~LOC~~ SOLN
120.0000 mg | Freq: Once | SUBCUTANEOUS | Status: AC
  Administered 2020-07-18: 120 mg via SUBCUTANEOUS

## 2020-07-18 NOTE — Patient Instructions (Signed)
Denosumab injection What is this medicine? DENOSUMAB (den oh sue mab) slows bone breakdown. Prolia is used to treat osteoporosis in women after menopause and in men, and in people who are taking corticosteroids for 6 months or more. Xgeva is used to treat a high calcium level due to cancer and to prevent bone fractures and other bone problems caused by multiple myeloma or cancer bone metastases. Xgeva is also used to treat giant cell tumor of the bone. This medicine may be used for other purposes; ask your health care provider or pharmacist if you have questions. COMMON BRAND NAME(S): Prolia, XGEVA What should I tell my health care provider before I take this medicine? They need to know if you have any of these conditions:  dental disease  having surgery or tooth extraction  infection  kidney disease  low levels of calcium or Vitamin D in the blood  malnutrition  on hemodialysis  skin conditions or sensitivity  thyroid or parathyroid disease  an unusual reaction to denosumab, other medicines, foods, dyes, or preservatives  pregnant or trying to get pregnant  breast-feeding How should I use this medicine? This medicine is for injection under the skin. It is given by a health care professional in a hospital or clinic setting. A special MedGuide will be given to you before each treatment. Be sure to read this information carefully each time. For Prolia, talk to your pediatrician regarding the use of this medicine in children. Special care may be needed. For Xgeva, talk to your pediatrician regarding the use of this medicine in children. While this drug may be prescribed for children as young as 13 years for selected conditions, precautions do apply. Overdosage: If you think you have taken too much of this medicine contact a poison control center or emergency room at once. NOTE: This medicine is only for you. Do not share this medicine with others. What if I miss a dose? It is  important not to miss your dose. Call your doctor or health care professional if you are unable to keep an appointment. What may interact with this medicine? Do not take this medicine with any of the following medications:  other medicines containing denosumab This medicine may also interact with the following medications:  medicines that lower your chance of fighting infection  steroid medicines like prednisone or cortisone This list may not describe all possible interactions. Give your health care provider a list of all the medicines, herbs, non-prescription drugs, or dietary supplements you use. Also tell them if you smoke, drink alcohol, or use illegal drugs. Some items may interact with your medicine. What should I watch for while using this medicine? Visit your doctor or health care professional for regular checks on your progress. Your doctor or health care professional may order blood tests and other tests to see how you are doing. Call your doctor or health care professional for advice if you get a fever, chills or sore throat, or other symptoms of a cold or flu. Do not treat yourself. This drug may decrease your body's ability to fight infection. Try to avoid being around people who are sick. You should make sure you get enough calcium and vitamin D while you are taking this medicine, unless your doctor tells you not to. Discuss the foods you eat and the vitamins you take with your health care professional. See your dentist regularly. Brush and floss your teeth as directed. Before you have any dental work done, tell your dentist you are   receiving this medicine. Do not become pregnant while taking this medicine or for 5 months after stopping it. Talk with your doctor or health care professional about your birth control options while taking this medicine. Women should inform their doctor if they wish to become pregnant or think they might be pregnant. There is a potential for serious side  effects to an unborn child. Talk to your health care professional or pharmacist for more information. What side effects may I notice from receiving this medicine? Side effects that you should report to your doctor or health care professional as soon as possible:  allergic reactions like skin rash, itching or hives, swelling of the face, lips, or tongue  bone pain  breathing problems  dizziness  jaw pain, especially after dental work  redness, blistering, peeling of the skin  signs and symptoms of infection like fever or chills; cough; sore throat; pain or trouble passing urine  signs of low calcium like fast heartbeat, muscle cramps or muscle pain; pain, tingling, numbness in the hands or feet; seizures  unusual bleeding or bruising  unusually weak or tired Side effects that usually do not require medical attention (report to your doctor or health care professional if they continue or are bothersome):  constipation  diarrhea  headache  joint pain  loss of appetite  muscle pain  runny nose  tiredness  upset stomach This list may not describe all possible side effects. Call your doctor for medical advice about side effects. You may report side effects to FDA at 1-800-FDA-1088. Where should I keep my medicine? This medicine is only given in a clinic, doctor's office, or other health care setting and will not be stored at home. NOTE: This sheet is a summary. It may not cover all possible information. If you have questions about this medicine, talk to your doctor, pharmacist, or health care provider.  2021 [employer] (2017-09-03 16:10:44)

## 2020-07-19 ENCOUNTER — Telehealth: Payer: Self-pay | Admitting: Oncology

## 2020-07-19 NOTE — Telephone Encounter (Signed)
Scheduled appts per 3/10 los. Pt confirmed appt date/time.

## 2020-07-20 LAB — CANCER ANTIGEN 27.29: CA 27.29: 117.5 U/mL — ABNORMAL HIGH (ref 0.0–38.6)

## 2020-07-23 ENCOUNTER — Other Ambulatory Visit: Payer: 59

## 2020-07-23 ENCOUNTER — Ambulatory Visit: Payer: 59

## 2020-07-31 ENCOUNTER — Other Ambulatory Visit (HOSPITAL_COMMUNITY): Payer: Self-pay | Admitting: Family Medicine

## 2020-07-31 MED FILL — LEVOTHYROXINE 137 MCG TABLE: 137 | 90 days supply | Qty: 90 | Fill #0

## 2020-07-31 MED FILL — LOSARTAN-HCTZ 100-25 MG TAB: 100-25 | 30 days supply | Qty: 30 | Fill #0

## 2020-08-02 ENCOUNTER — Other Ambulatory Visit (HOSPITAL_COMMUNITY): Payer: Self-pay

## 2020-08-05 ENCOUNTER — Encounter: Payer: Self-pay | Admitting: Oncology

## 2020-08-05 ENCOUNTER — Other Ambulatory Visit (HOSPITAL_COMMUNITY): Payer: Self-pay | Admitting: Family Medicine

## 2020-08-05 MED FILL — SERTRALINE HCL 100 MG TABS: 100 | 30 days supply | Qty: 30 | Fill #0

## 2020-08-06 MED FILL — CAPECITABINE 500 MG TABLET: 500 | 21 days supply | Qty: 84 | Fill #4

## 2020-08-11 ENCOUNTER — Other Ambulatory Visit (HOSPITAL_COMMUNITY): Payer: Self-pay

## 2020-08-20 ENCOUNTER — Ambulatory Visit (HOSPITAL_COMMUNITY): Payer: 59

## 2020-08-20 ENCOUNTER — Other Ambulatory Visit (HOSPITAL_COMMUNITY): Payer: Self-pay

## 2020-08-20 ENCOUNTER — Other Ambulatory Visit (HOSPITAL_BASED_OUTPATIENT_CLINIC_OR_DEPARTMENT_OTHER): Payer: Self-pay

## 2020-08-20 ENCOUNTER — Encounter (HOSPITAL_COMMUNITY): Payer: Self-pay

## 2020-08-20 ENCOUNTER — Other Ambulatory Visit: Payer: Self-pay | Admitting: Oncology

## 2020-08-21 ENCOUNTER — Other Ambulatory Visit: Payer: Self-pay

## 2020-08-21 ENCOUNTER — Other Ambulatory Visit (HOSPITAL_COMMUNITY): Payer: Self-pay

## 2020-08-21 DIAGNOSIS — Z170 Estrogen receptor positive status [ER+]: Secondary | ICD-10-CM

## 2020-08-21 DIAGNOSIS — C50211 Malignant neoplasm of upper-inner quadrant of right female breast: Secondary | ICD-10-CM

## 2020-08-21 MED ORDER — PREGABALIN 100 MG PO CAPS
100.0000 mg | ORAL_CAPSULE | Freq: Three times a day (TID) | ORAL | 0 refills | Status: DC
  Filled 2020-08-21: qty 90, 30d supply, fill #0

## 2020-08-21 NOTE — Progress Notes (Signed)
[place]  [phone] [phone]    ID: Debbie [name] DOB: [birth year]  MR#: [id number]  FXO#:[id number]  Patient Care Team: Kelton Pillar, MD as PCP - General (Family Medicine) Jacelyn Pi, MD as Consulting Physician (Endocrinology) Jovita Kussmaul, MD as Consulting Physician (General Surgery) Carola Viramontes, Debbie Dad, MD as Consulting Physician (Oncology) [name], MD as Attending Physician (Radiation Oncology) Belva Crome, MD as Consulting Physician (Cardiology) Crissie Reese, MD as Consulting Physician (Plastic Surgery) [name], [name], NP as Nurse Practitioner (Hematology and Oncology) Lajoyce Lauber, DMD (Dentistry) OTHER MD:   CHIEF COMPLAINT: Estrogen receptor positive stage IV  breast cancer (s/p bilateral mastectomies)  CURRENT TREATMENT: Capecitabine/Xeloda; denosumab/Xgeva   INTERVAL HISTORY: Debbie [name] returns today for follow-up of her estrogen receptor positive stage IV breast cancer.  She was switched to capecitabine on 05/21/2020.  She tolerates this generally well.  Mouth sores and diarrhea are not issues.  The palms are slightly dry sometimes but again not a major problem.  She is currently on the first of the 2-week cycle.  She continues on Xgeva every 4 weeks, with her most recent dose on 07/18/2020.  She has no side effects from this that she is aware of  Lab Results  Component Value Date   CA2729 117.5 (H) 07/18/2020   CA2729 76.3 (H) 06/20/2020   CA2729 63.2 (H) 05/21/2020   CA2729 57.1 (H) 04/25/2020   CA2729 48.6 (H) 03/28/2020   She is scheduled for abdomen MRI tomorrow, 08/23/2020.   REVIEW OF SYSTEMS: Debbie [name] continues to work 60 hours a week.  She is not exercising regularly but she walks as much as she can and she is doing housework in addition to her regular work.  She does not have any headaches or significant sinus issues.  Occasionally her vision is a little bit blurry.  There is no  dizziness, there have been no falls, and she does not think she has any balance problems.  She has had no cough phlegm production or pleurisy.  Sometimes she feels a little chilled on the upper right chest wall she says when she eats something that tends to happen.  She has numbness in her hands in the morning when she wakes up which may be due to carpal tunnel.  This does not localize to the finger pads or toe pads.  She also has some leg paresthesias, feeling like needles on her dry skin.  Aside from these issues a detailed review of systems today was stable.   COVID 19 VACCINATION STATUS: Status post Pfizer x2, with booster January 2021   BREAST CANCER HISTORY: From the original intake note:  Debbie [name] had screening mammography with tomosynthesis at the [facility] 10/02/2015. This showed 2 possible masses in the right breast as well as some suspicious calcifications. The left breast was unremarkable. On 10/15/2015 she underwent right diagnostic mammography with tomography and right breast ultrasonography. The breast density was category B. Spot magnification views confirmed an area of pleomorphic calcifications measuring up to 5 cm in the upper half of the breast. Also in the upper outer right breast there were adjacent low-density masses without distortion or calcifications. On physical exam there was no palpable abnormality.  Targeted ultrasound of the right breast found the 2 "masses" in the right upper quadrant were benign cysts measuring 0.8 and 0.5 cm respectively. Biopsy of the area of calcification on 10/15/2015 showed ([id number]) invasive ductal carcinoma, E-cadherin positive, grade 2, estrogen receptor 90% positive, progesterone  receptor 50% positive, both with strong staining intensity, with an MIB-1 of 70%, and no HER-2 amplification, the signals ratio being 1.18 and the number per cell 2.60.  Her subsequent history is as detailed below    PAST MEDICAL HISTORY: Past Medical  History:  Diagnosis Date  . Anxiety   . Arthritis   . Bone metastasis ([place])   . Breast cancer of upper-inner quadrant of right female breast [facility]) oncologist-  dr [name]--- 04/ 2019 ER/PR+ Stage IV w/ metastatic disease to liver, total spine, lung, bone   dx 06/ 2017  right breast invasive ductal carcinoma, Stage IIB, ER/PR + (pT2 pN1), grade 1-- s/p  right breast lumpecotmy w/ sln bx's and bilateral breast reduction 11-28-2015/  left breast with reduction , dx invasive ductal ca, Grade 1 (pT1cpNX) ER/PR positive/  adjuvant radiation completed 03-27-2016 to right chest wall, 2018  reclassifiec Stage IIA  . Cancer, metastatic to liver ([place])   . Chronic back pain   . Constipation   . Depression   . Family history of adverse reaction to anesthesia     mother has Copd was kept on vent   . Headache   . Heart murmur   . History of hyperthyroidism    s/p  RAI  . History of radiation therapy 02/11/16- 03/27/16   Right Chest Wall 50.4 Gy in 28 fractions, Right Chest Wall Boost 10 Gy in 5 fractions.   . Hyperlipidemia   . Hypertension   . Hypothyroidism, postradioiodine therapy    endocrinoloigst-  dr balan--  2004  s/p  RAI  . Menorrhagia 2011  . Multiple pulmonary nodules    secondary  metastatic  . PONV (postoperative nausea and vomiting)    patch helps   . SUI (stress urinary incontinence, female)   . Wears contact lenses   . Wears dentures    full upper and lower partial    PAST SURGICAL HISTORY: Past Surgical History:  Procedure Laterality Date  . BREAST LUMPECTOMY WITH NEEDLE LOCALIZATION AND AXILLARY SENTINEL LYMPH NODE BX Right 11/28/2015   Procedure: RIGHT BREAST LUMPECTOMY WITH NEEDLE LOCALIZATION AND AXILLARY SENTINEL LYMPH NODE BX;  Surgeon: Autumn Messing III, MD;  Location: [place];  Service: General;  Laterality: Right;  . BREAST REDUCTION SURGERY Bilateral 11/28/2015   Procedure: MAMMARY REDUCTION  (BREAST) BILATERAL;  Surgeon: Crissie Reese, MD;  Location: [place];  Service:  Plastics;  Laterality: Bilateral;  . CESAREAN SECTION  01/12/1993  . COLONOSCOPY    . Dental surgeries    . FEMUR IM NAIL Left 01/28/2018   Procedure: INTRAMEDULLARY (IM) NAIL FEMORAL;  Surgeon: Nicholes Stairs, MD;  Location: [place];  Service: Orthopedics;  Laterality: Left;  . HYSTEROSCOPY N/A 09/02/2017   Procedure: HYSTEROSCOPY  to remove IUD;  Surgeon: Eldred Manges, MD;  Location: [facility];  Service: Gynecology;  Laterality: N/A;  . Liver biospy    . MASTECTOMY    . MASTECTOMY W/ SENTINEL NODE BIOPSY Bilateral 12/26/2015   Procedure: BILATERAL MASTECTOMY WITH LEFT SENTINEL LYMPH NODE BIOPSY;  Surgeon: Autumn Messing III, MD;  Location: [place];  Service: General;  Laterality: Bilateral;  . Removal of IUD    . TUBAL LIGATION Bilateral 1995    FAMILY HISTORY: Family History  Problem Relation Age of Onset  . Diabetes Mother   . Hypertension Mother   . Hypertension Father   . Diabetes Maternal Grandmother   . Hypertension Sister   . Cancer Paternal Grandmother  colon  . Colon cancer Paternal Grandmother   . Esophageal cancer Neg Hx   . Stomach cancer Neg Hx   . Rectal cancer Neg Hx   The patient's parents are still living, in their early 45s as of June 2017. The patient has one brother, 2 sisters. On the maternal side there is a history of uterine and prostate cancer. On the paternal side there is a history of colon cancer and possibly uterine cancer. There is no history of breast or ovarian cancer in the family.    GYNECOLOGIC HISTORY:  No LMP recorded. (Menstrual status: IUD).  Menarche age 28. She still having regular periods. She is GX P1, first pregnancy age 9. She used oral contraceptives for a few years remotely, with no complications. She is currently on a Mirena IUD and also is status post bilateral tubal ligation.   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 Her husband Debbie [name] is a Administrator.   They are both taking appropriate precautions at work.  Their son Debbie [name], age 83, is a radio DJ.  He no longer lives with the patient.  The patient has no grandchildren. She is not a Ambulance person.   ADVANCED DIRECTIVES: In the absence of any documents to the contrary her husband is her healthcare power of attorney   HEALTH MAINTENANCE: Social History   Tobacco Use  . Smoking status: Never Smoker  . Smokeless tobacco: Never Used  Vaping Use  . Vaping Use: Never used  Substance Use Topics  . Alcohol use: No  . Drug use: No     Colonoscopy: January 2016   PAP: January 2016   Bone density: August 2017 showed a T score of +0.7 normal  Lipid panel:  Allergies  Allergen Reactions  . Ace Inhibitors Cough  . Atorvastatin Other (See Comments)    Joint pain  . Simvastatin Other (See Comments)    Joint pain    Current Outpatient Medications  Medication Sig Dispense Refill  . blood glucose meter kit and supplies KIT Dispense based on patient and insurance preference. Use up to four times daily as directed. (FOR ICD-9 250.00, 250.01). 1 each 0  . Calcium-Magnesium 250-125 MG TABS Take 1 tablet by mouth daily. 120 each 4  . capecitabine (XELODA) 500 MG tablet TAKE 3 TABLETS (1,500 MG TOTAL) BY MOUTH 2 TIMES DAILY AFTER A MEAL. TAKE FOR 14 DAYS ON, 7 DAYS OFF, REPEAT EVERY 21 DAYS. START 05/22/20 84 tablet 6  . capecitabine (XELODA) 500 MG tablet TAKE 3 TABLETS (1,500 MG TOTAL) BY MOUTH 2 (TWO) TIMES DAILY AFTER A MEAL. TO START 03/22/2021 84 tablet 6  . cetirizine (ZYRTEC ALLERGY) 10 MG tablet Take 10 mg by mouth daily as needed for allergies.     . chlorhexidine (PERIDEX) 0.12 % solution     . Ferrous Sulfate (IRON) 325 (65 Fe) MG TABS Take 1 tablet (325 mg total) by mouth daily. 30 each 2  . fluconazole (DIFLUCAN) 100 MG tablet TAKE 2 TABLETS BY MOUTH FOR THE 1ST DOSE THEN 1 TABLET DAILY 8 tablet 0  . furosemide (LASIX) 20 MG tablet Take 20 mg by mouth daily as needed.    Marland Kitchen  glucose blood test strip USE AS DIRECTED TO TEST UP TO FOUR TIMES DAILY 100 strip 0  . ibuprofen (ADVIL) 800 MG tablet Take 800 mg by mouth every 6 (six) hours as needed.    . Lancets (ONETOUCH ULTRASOFT) lancets Use as  instructed 100 each 12  . levothyroxine (SYNTHROID) 137 MCG tablet TAKE 1 TABLET BY MOUTH ON AN EMPTY STOMACH EVERY MORNING DAILY 90 DAYS 90 tablet 3  . levothyroxine (SYNTHROID) 137 MCG tablet TAKE 1 TABLET BY MOUTH ONCE A DAY 90 tablet 4  . levothyroxine (SYNTHROID) 137 MCG tablet TAKE 1 TABLET BY MOUTH ON AN EMPTY STOMACH EVERY MORNING DAILY 90 tablet 1  . levothyroxine (SYNTHROID, LEVOTHROID) 137 MCG tablet Take 137 mcg by mouth daily before breakfast.     . LORazepam (ATIVAN) 0.5 MG tablet Take 1 tablet (0.5 mg total) by mouth at bedtime as needed for anxiety. 30 tablet 0  . LORazepam (ATIVAN) 1 MG tablet TAKE 1/2 TO 1 TABLET BY MOUTH AT BEDTIME AS NEEDED 30 tablet 0  . LORazepam (ATIVAN) 1 MG tablet TAKE 1/2 TO 1 TABLET BY MOUTH AT BEDTIME AS NEEDED 30 tablet 0  . losartan-hydrochlorothiazide (HYZAAR) 100-25 MG tablet Take 1 tablet by mouth daily.    Marland Kitchen losartan-hydrochlorothiazide (HYZAAR) 100-25 MG tablet TAKE 1 TABLET BY MOUTH ONCE DAILY 90 tablet 1  . losartan-hydrochlorothiazide (HYZAAR) 100-25 MG tablet TAKE 1 TABLET BY MOUTH DAILY 90 tablet 3  . Multiple Vitamin (MULTIVITAMIN) tablet Take 1 tablet by mouth daily.    Marland Kitchen omega-3 acid ethyl esters (LOVAZA) 1 g capsule Take 1 g by mouth 2 (two) times daily.     . potassium chloride SA (K-DUR,KLOR-CON) 20 MEQ tablet Take 40 mEq by mouth daily.     . pregabalin (LYRICA) 100 MG capsule TAKE 1 CAPSULE BY MOUTH 3 TIMES A DAY 90 capsule 0  . prochlorperazine (COMPAZINE) 10 MG tablet Take 1 tablet (10 mg total) by mouth every 6 (six) hours as needed for nausea or vomiting. 30 tablet 0  . prochlorperazine (COMPAZINE) 10 MG tablet TAKE 1 TABLET (10 MG TOTAL) BY MOUTH EVERY 6 (SIX) HOURS AS NEEDED FOR NAUSEA OR VOMITING. 30 tablet 0   . rosuvastatin (CRESTOR) 5 MG tablet Take 1 tablet (5 mg total) by mouth daily.    . rosuvastatin (CRESTOR) 5 MG tablet TAKE 1 TABLET BY MOUTH ONCE DAILY 30 tablet 7  . sertraline (ZOLOFT) 100 MG tablet Take 1 tablet (100 mg total) by mouth daily.    . sertraline (ZOLOFT) 100 MG tablet TAKE 1 TABLET BY MOUTH ONCE A DAY 30 tablet 12  . sertraline (ZOLOFT) 100 MG tablet TAKE 1 TABLET BY MOUTH ONCE DAILY 30 tablet 12  . traMADol (ULTRAM) 50 MG tablet Take 50 mg by mouth every 6 (six) hours as needed.    . triamterene-hydrochlorothiazide (DYAZIDE) 37.5-25 MG capsule TAKE 1 CAPSULE BY MOUTH ONCE DAILY 90 capsule 0  . Vitamin D, Cholecalciferol, 1000 units CAPS Take 1 tablet by mouth daily. (Patient taking differently: Take 1,000 Units by mouth daily. ) 90 capsule 4   No current facility-administered medications for this visit.    OBJECTIVE: African-American woman who appears stated age  21:   08/22/20 1110  BP: (!) 157/88  Pulse: 88  Resp: 16  Temp: (!) 97.3 F (36.3 C)  SpO2: 97%     Body mass index is 43.66 kg/m.    ECOG FS:1 - Symptomatic but completely ambulatory  Filed Weights   08/22/20 1110  Weight: 274 lb 9.6 oz (124.6 kg)     Sclerae unicteric, EOMs intact Wearing a mask No cervical or supraclavicular adenopathy Lungs no rales or rhonchi Heart regular rate and rhythm Abd soft, obese, nontender, positive bowel sounds, no palpable masses MSK  no focal spinal tenderness, no upper extremity lymphedema Neuro: nonfocal, well oriented, appropriate affect Breasts: Status post bilateral mastectomies.  No evidence of chest wall recurrence.  Both axillae are benign   LAB RESULTS:  CMP     Component Value Date/Time   NA 138 07/18/2020 1159   NA 140 07/08/2016 0948   K 3.7 07/18/2020 1159   K 4.1 07/08/2016 0948   CL 104 07/18/2020 1159   CO2 27 07/18/2020 1159   CO2 24 07/08/2016 0948   GLUCOSE 89 07/18/2020 1159   GLUCOSE 89 07/08/2016 0948   BUN 11 07/18/2020 1159    BUN 11.9 07/08/2016 0948   CREATININE 0.88 07/18/2020 1159   CREATININE 0.77 05/16/2019 0905   CREATININE 0.9 07/08/2016 0948   CALCIUM 9.6 07/18/2020 1159   CALCIUM 10.0 07/08/2016 0948   PROT 7.4 07/18/2020 1159   PROT 7.6 07/08/2016 0948   ALBUMIN 3.4 (L) 07/18/2020 1159   ALBUMIN 3.6 07/08/2016 0948   AST 14 (L) 07/18/2020 1159   AST 35 05/16/2019 0905   AST 22 07/08/2016 0948   ALT 12 07/18/2020 1159   ALT 40 05/16/2019 0905   ALT 27 07/08/2016 0948   ALKPHOS 137 (H) 07/18/2020 1159   ALKPHOS 115 07/08/2016 0948   BILITOT 0.4 07/18/2020 1159   BILITOT 0.7 05/16/2019 0905   BILITOT 0.35 07/08/2016 0948   GFRNONAA >60 07/18/2020 1159   GFRNONAA >60 05/16/2019 0905   GFRAA >60 01/30/2020 0814   GFRAA >60 05/16/2019 0905    INo results found for: SPEP, UPEP  Lab Results  Component Value Date   WBC 5.5 08/22/2020   NEUTROABS 4.2 08/22/2020   HGB 12.2 08/22/2020   HCT 36.9 08/22/2020   MCV 91.8 08/22/2020   PLT 243 08/22/2020      Chemistry      Component Value Date/Time   NA 138 07/18/2020 1159   NA 140 07/08/2016 0948   K 3.7 07/18/2020 1159   K 4.1 07/08/2016 0948   CL 104 07/18/2020 1159   CO2 27 07/18/2020 1159   CO2 24 07/08/2016 0948   BUN 11 07/18/2020 1159   BUN 11.9 07/08/2016 0948   CREATININE 0.88 07/18/2020 1159   CREATININE 0.77 05/16/2019 0905   CREATININE 0.9 07/08/2016 0948      Component Value Date/Time   CALCIUM 9.6 07/18/2020 1159   CALCIUM 10.0 07/08/2016 0948   ALKPHOS 137 (H) 07/18/2020 1159   ALKPHOS 115 07/08/2016 0948   AST 14 (L) 07/18/2020 1159   AST 35 05/16/2019 0905   AST 22 07/08/2016 0948   ALT 12 07/18/2020 1159   ALT 40 05/16/2019 0905   ALT 27 07/08/2016 0948   BILITOT 0.4 07/18/2020 1159   BILITOT 0.7 05/16/2019 0905   BILITOT 0.35 07/08/2016 0948       No results found for: LABCA2  No components found for: LABCA125  No results for input(s): INR in the last 168 hours.  Urinalysis No results found  for: COLORURINE, APPEARANCEUR, LABSPEC, PHURINE, GLUCOSEU, HGBUR, BILIRUBINUR, KETONESUR, PROTEINUR, UROBILINOGEN, NITRITE, LEUKOCYTESUR   STUDIES: No results found.   ELIGIBLE FOR AVAILABLE RESEARCH PROTOCOL: PALLAS, but inelegible because of Mirena IUD   ASSESSMENT: 55 y.o. [place] woman status post right breast upper inner quadrant biopsy 10/15/2015 for a clinical T2-T3 N0, stage II invasive ductal carcinoma, estrogen and progesterone receptor positive, HER-2 nonamplified, with an Mib-1 of 70%  (1) status post right lumpectomy and sentinel lymph node sampling with oncoplastic breast reduction 11/28/2015 for  a pT2 pN1, stage IIB invasive ductal carcinoma, grade 1, with negative margins (1/1 sentinel node positive)  (a) reclassified as stage IIA in the 2018 prognostic revision   (2) left reduction mammoplasty 11/28/2015 unexpectedly found a pT1c pNX invasive ductal carcinoma, grade 1, estrogen and progesterone receptor positive, HER-2 not amplified, with an MIB-1 of 3%  (3) Mammaprint from the right sided breast cancer was "low risk", predicting a 98% chance of no disease recurrence within 5 years with anti-estrogens as the only systemic therapy. It also predicts minimal to no benefit from chemotherapy  (4) status post bilateral mastectomies with bilateral sentinel lymph node sampling 12/26/2015, showing  (a) on the right, no residual carcinoma (0/3 nodes involved)  (b) on the left, focal ductal carcinoma in situ, 0.2 cm, with negative margins; (0/4 nodes involved)  (5) adjuvant radiation 02/11/16 - 03/27/16    1) Right chest wall: 50.4 Gy in 28 fractions               2) Right chest wall boost: 10 Gy in 5 fractions   (6) started on tamoxifen neoadjuvantly 10/23/2015 to allow for expected delays in definitive local treatment  (a) Mirena IUD in place  (b) tamoxifen discontinued 08/13/2017, with progression  (7) since she had 4 lymph nodes removed from each axilla but also receive  radiation on the right, lab draws should preferentially be obtained from the left arm   METASTATIC DISEASE: April 2019 (8) CT scan of the abdomen and pelvis 08/04/2017, MRI of the liver and total spine MRI 08/11/2017 showed liver and bone metastases  (a) liver biopsy 09/07/2017 confirms estrogen receptor positive, progesterone receptor negative, HER-2 negative metastatic breast cancer  (b) chest CT scan 08/13/2017 shows multiple bilateral pulmonary nodules.  (c) CT of the brain with and without contrast 08/26/2017 shows no evidence of metastatic disease to the brain  (d) CA 27-29 is informative  (9) fulvestrant started 08/17/2017  (a) palbociclib started 08/31/2017  (b) fulvestrant and palbociclib discontinued 01/04/2018 with evidence of progression in the liver  (10) denosumab/Xgeva started 09/14/2017  (a) held after 03/01/2018 dose because concern re. osteonecrosis  (b) resumed 06/09/2018  (c) 02/21/2019 dose held due to oral surgery, resumed 03/21/2019  (11) anastrozole started 01/04/2018  (a) everolimus started 01/29/2018  (b) liver MRI and bone scan December 2019 show stable disease  (c) chest CT scan on 08/02/2018 shows lung lesions resolved, bone lesions stable  (d) bone scan 09/13/2018 essentially stable  (e) liver MRI 12/08/2018 shows improvement in hepatic metastsases  (f) liver MRI and bone scan 04/13/2019 showed continuing improvement in the liver, stable bone lesions  (g) chest CT 08/01/2019 stable, no evidence of active disease ows  (12) 01/28/18 prophylactic left femur intramedullary nail surgery for impending left femur pathologic fracture  (a) palliative radiation with Dr. [name] to the left femur and left ilium from 02/16/2018 to 03/01/2018  (13) [facility] report on liver biopsy from April 2019 shows a negative PDL 1, stable MSI and proficient mismatch repair status.  The tumor is positive for the androgen receptor, and for PTEN for PIK3 CA mutations.  HER-2/neu was read as  equivocal (it was negative by FISH on the pathology report).  (14) Progression noted in liver MRI on 01/11/2020; fulvestrant started on 01/30/2020  (a) alpelisib started 01/30/2020 at 300 mg daily with Metformin 500 mg daily  (b) alpelisib held on 02/07/2020 with very high blood sugars.  Metformin increased to 1 g daily  (c) alpelisib resumed at 150  mg daily on 02/13/2020  (d) liver MRI 05/06/2020 shows evidence of disease progression  (e) fulvestrant and alpelisib discontinued January 2022  (15) capecitabine started 05/22/2020 at 1.5 g twice daily, 14/7  (16) if we document progression on capecitabine would consider paclitaxel   PLAN: Debbie [name] has been on capecitabine for a little over 3 months.  Unfortunately the CA 27-29 is rising rather than dropping.  She is already scheduled for liver MRI tomorrow.  She understands I will be out of town so I will not be able to call her with results but she will be able to read the results in my chart.  If we see any clear evidence of progression she will stop the capecitabine and we will switch to Taxol.  We discussed the possible toxicities side effects and complications of that agent today and specifically we would start 09/10/2020.  If we do not see evidence of progression then of course we would continue capecitabine as we have been doing.  We are continuing the [place] today and every 4 weeks as before.  I am setting her up for repeat CT of the chest next week just to complete the restaging studies  Total encounter time 35 minutes.Debbie Jews C. Brittiany Wiehe, MD 08/22/20 11:23 AM Medical Oncology and Hematology [facility] [place], [place] [address] Tel. [phone]    Fax. [phone]   I, Wilburn Mylar, am acting as scribe for Dr. Virgie Dad. Zriyah [name].  I, Lurline Del MD, have reviewed the above documentation for accuracy and completeness, and I agree with the above.   *Total Encounter Time as defined by the  Centers for Medicare and Medicaid Services includes, in addition to the face-to-face time of a patient visit (documented in the note above) non-face-to-face time: obtaining and reviewing outside history, ordering and reviewing medications, tests or procedures, care coordination (communications with other health care professionals or caregivers) and documentation in the medical record.

## 2020-08-22 ENCOUNTER — Inpatient Hospital Stay: Payer: 59

## 2020-08-22 ENCOUNTER — Other Ambulatory Visit: Payer: Self-pay

## 2020-08-22 ENCOUNTER — Inpatient Hospital Stay: Payer: 59 | Attending: Oncology | Admitting: Oncology

## 2020-08-22 VITALS — BP 157/88 | HR 88 | Temp 97.3°F | Resp 16 | Ht 66.5 in | Wt 274.6 lb

## 2020-08-22 DIAGNOSIS — C787 Secondary malignant neoplasm of liver and intrahepatic bile duct: Secondary | ICD-10-CM

## 2020-08-22 DIAGNOSIS — C50211 Malignant neoplasm of upper-inner quadrant of right female breast: Secondary | ICD-10-CM | POA: Diagnosis not present

## 2020-08-22 DIAGNOSIS — C50112 Malignant neoplasm of central portion of left female breast: Secondary | ICD-10-CM | POA: Diagnosis not present

## 2020-08-22 DIAGNOSIS — Z170 Estrogen receptor positive status [ER+]: Secondary | ICD-10-CM

## 2020-08-22 DIAGNOSIS — C7951 Secondary malignant neoplasm of bone: Secondary | ICD-10-CM | POA: Insufficient documentation

## 2020-08-22 LAB — CBC WITH DIFFERENTIAL (CANCER CENTER ONLY)
Abs Immature Granulocytes: 0.04 10*3/uL (ref 0.00–0.07)
Basophils Absolute: 0 10*3/uL (ref 0.0–0.1)
Basophils Relative: 0 %
Eosinophils Absolute: 0.2 10*3/uL (ref 0.0–0.5)
Eosinophils Relative: 4 %
HCT: 36.9 % (ref 36.0–46.0)
Hemoglobin: 12.2 g/dL (ref 12.0–15.0)
Immature Granulocytes: 1 %
Lymphocytes Relative: 13 %
Lymphs Abs: 0.7 10*3/uL (ref 0.7–4.0)
MCH: 30.3 pg (ref 26.0–34.0)
MCHC: 33.1 g/dL (ref 30.0–36.0)
MCV: 91.8 fL (ref 80.0–100.0)
Monocytes Absolute: 0.4 10*3/uL (ref 0.1–1.0)
Monocytes Relative: 7 %
Neutro Abs: 4.2 10*3/uL (ref 1.7–7.7)
Neutrophils Relative %: 75 %
Platelet Count: 243 10*3/uL (ref 150–400)
RBC: 4.02 MIL/uL (ref 3.87–5.11)
RDW: 17.5 % — ABNORMAL HIGH (ref 11.5–15.5)
WBC Count: 5.5 10*3/uL (ref 4.0–10.5)
nRBC: 0 % (ref 0.0–0.2)

## 2020-08-22 LAB — CMP (CANCER CENTER ONLY)
ALT: 14 U/L (ref 0–44)
AST: 16 U/L (ref 15–41)
Albumin: 3.4 g/dL — ABNORMAL LOW (ref 3.5–5.0)
Alkaline Phosphatase: 162 U/L — ABNORMAL HIGH (ref 38–126)
Anion gap: 11 (ref 5–15)
BUN: 11 mg/dL (ref 6–20)
CO2: 28 mmol/L (ref 22–32)
Calcium: 9.5 mg/dL (ref 8.9–10.3)
Chloride: 103 mmol/L (ref 98–111)
Creatinine: 0.84 mg/dL (ref 0.44–1.00)
GFR, Estimated: >60 mL/min (ref >60)
Glucose, Bld: 94 mg/dL (ref 70–99)
Potassium: 4 mmol/L (ref 3.5–5.1)
Sodium: 142 mmol/L (ref 135–145)
Total Bilirubin: 0.3 mg/dL (ref 0.3–1.2)
Total Protein: 7.4 g/dL (ref 6.5–8.1)

## 2020-08-22 MED ORDER — DENOSUMAB 120 MG/1.7ML ~~LOC~~ SOLN
SUBCUTANEOUS | Status: AC
  Filled 2020-08-22: qty 1.7

## 2020-08-22 MED ORDER — DENOSUMAB 120 MG/1.7ML ~~LOC~~ SOLN
120.0000 mg | Freq: Once | SUBCUTANEOUS | Status: AC
  Administered 2020-08-22: 120 mg via SUBCUTANEOUS

## 2020-08-23 ENCOUNTER — Other Ambulatory Visit: Payer: Self-pay

## 2020-08-23 ENCOUNTER — Ambulatory Visit (HOSPITAL_COMMUNITY)
Admission: RE | Admit: 2020-08-23 | Discharge: 2020-08-23 | Disposition: A | Discharge status: Home / Self Care | Payer: 59 | Source: Ambulatory Visit | Attending: Oncology | Admitting: Oncology

## 2020-08-23 DIAGNOSIS — Z170 Estrogen receptor positive status [ER+]: Secondary | ICD-10-CM | POA: Insufficient documentation

## 2020-08-23 DIAGNOSIS — C50211 Malignant neoplasm of upper-inner quadrant of right female breast: Secondary | ICD-10-CM | POA: Insufficient documentation

## 2020-08-23 DIAGNOSIS — K529 Noninfective gastroenteritis and colitis, unspecified: Secondary | ICD-10-CM | POA: Diagnosis not present

## 2020-08-23 LAB — CANCER ANTIGEN 27.29: CA 27.29: 177.3 U/mL — ABNORMAL HIGH (ref 0.0–38.6)

## 2020-08-23 MED ORDER — GADOBUTROL 1 MMOL/ML IV SOLN
10.0000 mL | Freq: Once | INTRAVENOUS | Status: AC | PRN
  Administered 2020-08-23: 10 mL via INTRAVENOUS

## 2020-08-26 ENCOUNTER — Other Ambulatory Visit: Payer: Self-pay | Admitting: Oncology

## 2020-08-26 DIAGNOSIS — Z170 Estrogen receptor positive status [ER+]: Secondary | ICD-10-CM

## 2020-08-26 DIAGNOSIS — C50211 Malignant neoplasm of upper-inner quadrant of right female breast: Secondary | ICD-10-CM

## 2020-08-26 MED ORDER — LIDOCAINE-PRILOCAINE 2.5-2.5 % EX CREA
TOPICAL_CREAM | CUTANEOUS | 3 refills | Status: DC
  Filled 2020-08-26: qty 30, 1d supply, fill #0

## 2020-08-26 MED ORDER — PROCHLORPERAZINE MALEATE 10 MG PO TABS
10.0000 mg | ORAL_TABLET | Freq: Four times a day (QID) | ORAL | 1 refills | Status: DC | PRN
  Filled 2020-08-26: qty 30, 8d supply, fill #0

## 2020-08-26 NOTE — Progress Notes (Unsigned)
I called Debbie [name] and discussed the MRI with her.  We are going off the capecitabine and starting paclitaxel weekly.  She will receive it on days 1 8 and 15 of each 28-day cycle.  It would be helpful if she could have a port placed before we start.  Target start date is May 10.  I am alerting her surgeon.

## 2020-08-26 NOTE — Progress Notes (Signed)
DISCONTINUE OFF PATHWAY REGIMEN - Breast   OFF00014:Capecitabine 1,000 mg/m2 twice daily, 14d on 7d off:   A cycle is every 21 days:     Capecitabine   **Always confirm dose/schedule in your pharmacy ordering system**  REASON: Disease Progression PRIOR TREATMENT: Off Pathway: Capecitabine 1,000 mg/m2 twice daily, 14d on 7d off TREATMENT RESPONSE: Progressive Disease (PD)  START ON PATHWAY REGIMEN - Breast     A cycle is every 28 days (3 weeks on and 1 week off):     Paclitaxel   **Always confirm dose/schedule in your pharmacy ordering system**  Patient Characteristics: Distant Metastases or Locoregional Recurrent Disease - Unresected or Locally Advanced Unresectable Disease Progressing after Neoadjuvant and Local Therapies, HER2 Negative/Unknown/Equivocal, ER Positive, Chemotherapy, First Line Therapeutic Status: Distant Metastases ER Status: Positive (+) HER2 Status: Negative (-) PR Status: Positive (+) Therapy Approach Indicated: Standard Chemotherapy/Endocrine Therapy Line of Therapy: First Line Intent of Therapy: Non-Curative / Palliative Intent, Discussed with Patient

## 2020-08-27 ENCOUNTER — Other Ambulatory Visit (HOSPITAL_COMMUNITY): Payer: Self-pay

## 2020-08-27 ENCOUNTER — Encounter: Payer: Self-pay | Admitting: Oncology

## 2020-08-27 ENCOUNTER — Telehealth: Payer: Self-pay | Admitting: Oncology

## 2020-08-27 NOTE — Telephone Encounter (Signed)
Scheduled appt per 4/14 los - pt is aware of appt date and time

## 2020-08-28 ENCOUNTER — Telehealth: Payer: Self-pay | Admitting: Oncology

## 2020-08-28 NOTE — Telephone Encounter (Signed)
Scheduled appts per 4/18 sch msg. Pt aware.  

## 2020-08-29 ENCOUNTER — Other Ambulatory Visit (HOSPITAL_COMMUNITY): Payer: Self-pay

## 2020-09-02 ENCOUNTER — Other Ambulatory Visit: Payer: Self-pay | Admitting: Oncology

## 2020-09-02 ENCOUNTER — Telehealth: Payer: Self-pay

## 2020-09-02 DIAGNOSIS — C50112 Malignant neoplasm of central portion of left female breast: Secondary | ICD-10-CM

## 2020-09-02 DIAGNOSIS — Z170 Estrogen receptor positive status [ER+]: Secondary | ICD-10-CM

## 2020-09-02 MED FILL — Losartan Potassium & Hydrochlorothiazide Tab 100-25 MG: ORAL | 90 days supply | Qty: 90 | Fill #0 | Status: AC

## 2020-09-02 MED FILL — Sertraline HCl Tab 100 MG: ORAL | 30 days supply | Qty: 30 | Fill #0 | Status: AC

## 2020-09-02 MED FILL — Rosuvastatin Calcium Tab 5 MG: ORAL | 30 days supply | Qty: 30 | Fill #0 | Status: CN

## 2020-09-02 NOTE — Telephone Encounter (Signed)
Pt has been scheduled for port placement in IR at [facility] for 09/16/20 at 1200. Pt understands she needs to be NPO after 7 AM and will need a driver. Pt also understands she should not use her emla cream for 2-3 weeks after having port placed. Pt verbalized thanks and understanding.

## 2020-09-03 ENCOUNTER — Other Ambulatory Visit (HOSPITAL_COMMUNITY): Payer: Self-pay

## 2020-09-03 ENCOUNTER — Other Ambulatory Visit: Payer: Self-pay

## 2020-09-03 ENCOUNTER — Ambulatory Visit (HOSPITAL_COMMUNITY)
Admission: RE | Admit: 2020-09-03 | Discharge: 2020-09-03 | Disposition: A | Discharge status: Home / Self Care | Payer: 59 | Source: Ambulatory Visit | Attending: Oncology | Admitting: Oncology

## 2020-09-03 DIAGNOSIS — C50211 Malignant neoplasm of upper-inner quadrant of right female breast: Secondary | ICD-10-CM | POA: Insufficient documentation

## 2020-09-03 DIAGNOSIS — C7951 Secondary malignant neoplasm of bone: Secondary | ICD-10-CM | POA: Diagnosis not present

## 2020-09-03 DIAGNOSIS — C50112 Malignant neoplasm of central portion of left female breast: Secondary | ICD-10-CM | POA: Insufficient documentation

## 2020-09-03 DIAGNOSIS — Z170 Estrogen receptor positive status [ER+]: Secondary | ICD-10-CM

## 2020-09-03 DIAGNOSIS — C787 Secondary malignant neoplasm of liver and intrahepatic bile duct: Secondary | ICD-10-CM | POA: Diagnosis not present

## 2020-09-03 DIAGNOSIS — J929 Pleural plaque without asbestos: Secondary | ICD-10-CM | POA: Diagnosis not present

## 2020-09-03 DIAGNOSIS — J984 Other disorders of lung: Secondary | ICD-10-CM | POA: Diagnosis not present

## 2020-09-03 DIAGNOSIS — C50919 Malignant neoplasm of unspecified site of unspecified female breast: Secondary | ICD-10-CM | POA: Diagnosis not present

## 2020-09-03 MED ORDER — IOHEXOL 300 MG/ML  SOLN
75.0000 mL | Freq: Once | INTRAMUSCULAR | Status: AC | PRN
  Administered 2020-09-03: 75 mL via INTRAVENOUS

## 2020-09-03 MED FILL — Rosuvastatin Calcium Tab 5 MG: ORAL | 30 days supply | Qty: 30 | Fill #0 | Status: AC

## 2020-09-03 NOTE — Progress Notes (Signed)
Pharmacist Chemotherapy Monitoring - Initial Assessment    Anticipated start date: 09/10/20  Regimen:  . Are orders appropriate based on the patient's diagnosis, regimen, and cycle? Yes . Does the plan date match the patient's scheduled date? Yes . Is the sequencing of drugs appropriate? Yes . Are the premedications appropriate for the patient's regimen? Yes . Prior Authorization for treatment is: Pending o If applicable, is the correct biosimilar selected based on the patient's insurance? not applicable  Organ Function and Labs: Marland Kitchen Are dose adjustments needed based on the patient's renal function, hepatic function, or hematologic function? No . Are appropriate labs ordered prior to the start of patient's treatment? Yes . Other organ system assessment, if indicated: N/A . The following baseline labs, if indicated, have been ordered: N/A  Dose Assessment: . Are the drug doses appropriate? Yes . Are the following correct: o Drug concentrations Yes o IV fluid compatible with drug Yes o Administration routes Yes o Timing of therapy Yes . If applicable, does the patient have documented access for treatment and/or plans for port-a-cath placement? yes . If applicable, have lifetime cumulative doses been properly documented and assessed? not applicable Lifetime Dose Tracking  No doses have been documented on this patient for the following tracked chemicals: Doxorubicin, Epirubicin, Idarubicin, Daunorubicin, Mitoxantrone, Bleomycin, Oxaliplatin, Carboplatin, Liposomal Doxorubicin  o   Toxicity Monitoring/Prevention: . The patient has the following take home antiemetics prescribed: Prochlorperazine . The patient has the following take home medications prescribed: N/A . Medication allergies and previous infusion related reactions, if applicable, have been reviewed and addressed. Yes . The patient's current medication list has been assessed for drug-drug interactions with their chemotherapy  regimen. no significant drug-drug interactions were identified on review.  Order Review: . Are the treatment plan orders signed? Yes . Is the patient scheduled to see a provider prior to their treatment? Yes  I verify that I have reviewed each item in the above checklist and answered each question accordingly.   Kennith Center, Pharm.D., CPP 09/03/2020@1 :49 PM

## 2020-09-09 NOTE — Progress Notes (Signed)
[place]  [phone] [phone]    ID: Debbie [name] DOB: 1965-09-02  MR#: [id number]  ZDG#:[id number]  Patient Care Team: Debbie Pillar, MD as PCP - General (Family Medicine) Debbie Pi, MD as Consulting Physician (Endocrinology) Debbie Kussmaul, MD as Consulting Physician (General Surgery) Debbie [name], Debbie Dad, MD as Consulting Physician (Oncology) Debbie Gibson, MD as Attending Physician (Radiation Oncology) Debbie Crome, MD as Consulting Physician (Cardiology) Debbie Reese, MD as Consulting Physician (Plastic Surgery) Debbie [name], Debbie Massed, NP as Nurse Practitioner (Hematology and Oncology) Debbie [name], DMD (Dentistry) OTHER MD:   CHIEF COMPLAINT: Estrogen receptor positive stage IV  breast cancer (s/p bilateral mastectomies)  CURRENT TREATMENT: paclitaxel; denosumab/Xgeva   INTERVAL HISTORY: Debbie [name] returns today for follow-up of her estrogen receptor positive stage IV breast cancer.  Since her last visit, she underwent restaging abdomen MRI on 08/23/2020 showing: progressive worsening of hepatobiliary metastatic disease with new and enlarging lesions noted, about 20 scattered lesions visible in liver; mild progression of osseous metastatic disease with some new lesions observed; chronic hazy stranding in central mesentery favoring chronic sclerosing mesenteritis; possible subcarinal adenopathy eccentric to right.  She also underwent chest CT on 09/03/2020 showing: new mediastinal and right hilar lymphadenopathy with multiple new tiny bilateral pulmonary nodules; interval development of soft tissue pleural thickening in left hemithorax laterally; similar appearance of mixed-density bone lesions.  She is scheduled for port placement on 09/16/2020. We are switching her to weekly paclitaxel, to start 09/10/2020.  She continues on Xgeva every 4 weeks, with her most recent dose on 04 14/2022.  She has no side effects from this  that she is aware of  Lab Results  Component Value Date   CA2729 177.3 (H) 08/22/2020   CA2729 117.5 (H) 07/18/2020   CA2729 76.3 (H) 06/20/2020   CA2729 63.2 (H) 05/21/2020   CA2729 57.1 (H) 04/25/2020    REVIEW OF SYSTEMS: Debbie [name] continues to have right upper extremity lymphedema.  She is not wearing her sleeve today but she does wear it most of the time.  She is working 312-hour days +4 hours every week.  She has gained some weight which she does not like.  She says despite that her appetite is down and her sense of taste continues to be changed.  She is eating a lot of fruits she says.  She is now a bit constipated.  She is using MiraLAX and stool softeners.  She has baseline peripheral neuropathy involving the feet.  This is very minimal and inconsistent.  She has none in her hands.  She has no cough phlegm production or pleurisy problems, no skin changes that she has noted, no unusual headaches, nausea or vomiting.  Detailed review of systems today was otherwise stable   COVID 19 VACCINATION STATUS: Status post [place] x2, with booster January 2021   BREAST CANCER HISTORY: From the original intake note:  Debbie [name] had screening mammography with tomosynthesis at the De[name] 10/02/2015. This showed 2 possible masses in the right breast as well as some suspicious calcifications. The left breast was unremarkable. On 10/15/2015 she underwent right diagnostic mammography with tomography and right breast ultrasonography. The breast density was category B. Spot magnification views confirmed an area of pleomorphic calcifications measuring up to 5 cm in the upper half of the breast. Also in the upper outer right breast there were adjacent low-density masses without distortion or calcifications. On physical exam there was no palpable abnormality.  Targeted ultrasound of the right breast  found the 2 "masses" in the right upper quadrant were benign cysts measuring 0.8 and 0.5 cm respectively.  Biopsy of the area of calcification on 10/15/2015 showed ([id number]) invasive ductal carcinoma, E-cadherin positive, grade 2, estrogen receptor 90% positive, progesterone receptor 50% positive, both with strong staining intensity, with an MIB-1 of 70%, and no HER-2 amplification, the signals ratio being 1.18 and the number per cell 2.60.  Her subsequent history is as detailed below    PAST MEDICAL HISTORY: Past Medical History:  Diagnosis Date  . Anxiety   . Arthritis   . Bone metastasis (Debbie [name])   . Breast cancer of upper-inner quadrant of right female breast Debbie [name]) oncologist-  dr Debbie [name]--- 04/ 2019 ER/PR+ Stage IV w/ metastatic disease to liver, total spine, lung, bone   dx 06/ 2017  right breast invasive ductal carcinoma, Stage IIB, ER/PR + (pT2 pN1), grade 1-- s/p  right breast lumpecotmy w/ sln bx's and bilateral breast reduction 11-28-2015/  left breast with reduction , dx invasive ductal ca, Grade 1 (pT1cpNX) ER/PR positive/  adjuvant radiation completed 03-27-2016 to right chest wall, 2018  reclassifiec Stage IIA  . Cancer, metastatic to liver (Debbie [name])   . Chronic back pain   . Constipation   . Depression   . Family history of adverse reaction to anesthesia     mother has Copd was kept on vent   . Headache   . Heart murmur   . History of hyperthyroidism    s/p  RAI  . History of radiation therapy 02/11/16- 03/27/16   Right Chest Wall 50.4 Gy in 28 fractions, Right Chest Wall Boost 10 Gy in 5 fractions.   . Hyperlipidemia   . Hypertension   . Hypothyroidism, postradioiodine therapy    endocrinoloigst-  dr [name]--  2004  s/p  RAI  . Menorrhagia 2011  . Multiple pulmonary nodules    secondary  metastatic  . PONV (postoperative nausea and vomiting)    patch helps   . SUI (stress urinary incontinence, female)   . Wears contact lenses   . Wears dentures    full upper and lower partial    PAST SURGICAL HISTORY: Past Surgical History:  Procedure Laterality Date  . BREAST  LUMPECTOMY WITH NEEDLE LOCALIZATION AND AXILLARY SENTINEL LYMPH NODE BX Right 11/28/2015   Procedure: RIGHT BREAST LUMPECTOMY WITH NEEDLE LOCALIZATION AND AXILLARY SENTINEL LYMPH NODE BX;  Surgeon: Autumn Messing III, MD;  Location: [place];  Service: General;  Laterality: Right;  . BREAST REDUCTION SURGERY Bilateral 11/28/2015   Procedure: MAMMARY REDUCTION  (BREAST) BILATERAL;  Surgeon: Debbie Reese, MD;  Location: [place];  Service: Plastics;  Laterality: Bilateral;  . CESAREAN SECTION  01/12/1993  . COLONOSCOPY    . Dental surgeries    . FEMUR IM NAIL Left 01/28/2018   Procedure: INTRAMEDULLARY (IM) NAIL FEMORAL;  Surgeon: Nicholes Stairs, MD;  Location: [place];  Service: Orthopedics;  Laterality: Left;  . HYSTEROSCOPY N/A 09/02/2017   Procedure: HYSTEROSCOPY  to remove IUD;  Surgeon: Eldred Manges, MD;  Location: [facility]  Service: Gynecology;  Laterality: N/A;  . Liver biospy    . MASTECTOMY    . MASTECTOMY W/ SENTINEL NODE BIOPSY Bilateral 12/26/2015   Procedure: BILATERAL MASTECTOMY WITH LEFT SENTINEL LYMPH NODE BIOPSY;  Surgeon: Autumn Messing III, MD;  Location: [place];  Service: General;  Laterality: Bilateral;  . Removal of IUD    . TUBAL LIGATION Bilateral 1995    FAMILY HISTORY: Family  History  Problem Relation Age of Onset  . Diabetes Mother   . Hypertension Mother   . Hypertension Father   . Diabetes Maternal Grandmother   . Hypertension Sister   . Cancer Paternal Grandmother        colon  . Colon cancer Paternal Grandmother   . Esophageal cancer Neg Hx   . Stomach cancer Neg Hx   . Rectal cancer Neg Hx   The patient's parents are still living, in their early 75s as of June 2017. The patient has one brother, 2 sisters. On the maternal side there is a history of uterine and prostate cancer. On the paternal side there is a history of colon cancer and possibly uterine cancer. There is no history of breast or ovarian cancer in the family.    GYNECOLOGIC  HISTORY:  No LMP recorded. (Menstrual status: IUD).  Menarche age 89. She still having regular periods. She is GX P1, first pregnancy age 37. She used oral contraceptives for a few years remotely, with no complications. She is currently on a Mirena IUD and also is status post bilateral tubal ligation.   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 Her husband [name] is a Administrator.  They are both taking appropriate precautions at work.  Their son [name], age 58, is a radio DJ.  He no longer lives with the patient.  The patient has no grandchildren. She is not a Ambulance person.   ADVANCED DIRECTIVES: In the absence of any documents to the contrary her husband is her healthcare power of attorney   HEALTH MAINTENANCE: Social History   Tobacco Use  . Smoking status: Never Smoker  . Smokeless tobacco: Never Used  Vaping Use  . Vaping Use: Never used  Substance Use Topics  . Alcohol use: No  . Drug use: No     Colonoscopy: January 2016   PAP: January 2016   Bone density: August 2017 showed a T score of +0.7 normal  Lipid panel:  Allergies  Allergen Reactions  . Ace Inhibitors Cough  . Atorvastatin Other (See Comments)    Joint pain  . Simvastatin Other (See Comments)    Joint pain    Current Outpatient Medications  Medication Sig Dispense Refill  . blood glucose meter kit and supplies KIT Dispense based on patient and insurance preference. Use up to four times daily as directed. (FOR ICD-9 250.00, 250.01). 1 each 0  . Calcium-Magnesium 250-125 MG TABS Take 1 tablet by mouth daily. 120 each 4  . cetirizine (ZYRTEC ALLERGY) 10 MG tablet Take 10 mg by mouth daily as needed for allergies.     . chlorhexidine (PERIDEX) 0.12 % solution     . Ferrous Sulfate (IRON) 325 (65 Fe) MG TABS Take 1 tablet (325 mg total) by mouth daily. 30 each 2  . fluconazole (DIFLUCAN) 100 MG tablet TAKE 2 TABLETS BY MOUTH FOR THE 1ST DOSE THEN 1 TABLET  DAILY 8 tablet 0  . furosemide (LASIX) 20 MG tablet Take 20 mg by mouth daily as needed.    Marland Kitchen glucose blood test strip USE AS DIRECTED TO TEST UP TO FOUR TIMES DAILY 100 strip 0  . ibuprofen (ADVIL) 800 MG tablet Take 800 mg by mouth every 6 (six) hours as needed.    . Lancets (ONETOUCH ULTRASOFT) lancets Use as instructed 100 each 12  . levothyroxine (SYNTHROID) 137 MCG tablet TAKE 1 TABLET BY MOUTH ON AN EMPTY STOMACH EVERY  MORNING DAILY 90 DAYS 90 tablet 3  . lidocaine-prilocaine (EMLA) cream Apply to affected area once 30 g 3  . LORazepam (ATIVAN) 1 MG tablet TAKE 1/2 TO 1 TABLET BY MOUTH AT BEDTIME AS NEEDED 30 tablet 0  . losartan-hydrochlorothiazide (HYZAAR) 100-25 MG tablet TAKE 1 TABLET BY MOUTH ONCE DAILY 90 tablet 1  . Multiple Vitamin (MULTIVITAMIN) tablet Take 1 tablet by mouth daily.    Marland Kitchen omega-3 acid ethyl esters (LOVAZA) 1 g capsule Take 1 g by mouth 2 (two) times daily.     . potassium chloride SA (K-DUR,KLOR-CON) 20 MEQ tablet Take 40 mEq by mouth daily.     . pregabalin (LYRICA) 100 MG capsule TAKE 1 CAPSULE BY MOUTH 3 TIMES A DAY 90 capsule 0  . prochlorperazine (COMPAZINE) 10 MG tablet Take 1 tablet (10 mg total) by mouth every 6 (six) hours as needed (Nausea or vomiting). 30 tablet 1  . rosuvastatin (CRESTOR) 5 MG tablet TAKE 1 TABLET BY MOUTH ONCE DAILY 30 tablet 7  . sertraline (ZOLOFT) 100 MG tablet TAKE 1 TABLET BY MOUTH ONCE A DAY 30 tablet 12  . traMADol (ULTRAM) 50 MG tablet Take 50 mg by mouth every 6 (six) hours as needed.    . Vitamin D, Cholecalciferol, 1000 units CAPS Take 1 tablet by mouth daily. (Patient taking differently: Take 1,000 Units by mouth daily. ) 90 capsule 4   No current facility-administered medications for this visit.    OBJECTIVE: African-American [name] who appears stated age  9:   09/10/20 0752  BP: (!) 163/73  Pulse: 84  Resp: 18  Temp: 97.7 F (36.5 C)  SpO2: 98%     Body mass index is 44.36 kg/m.    ECOG FS:1 - Symptomatic  but completely ambulatory  Filed Weights   09/10/20 0752  Weight: 279 lb (126.6 kg)     Sclerae unicteric, EOMs intact Wearing a mask No cervical or supraclavicular adenopathy Lungs no rales or rhonchi Heart regular rate and rhythm Abd soft, obese, nontender, positive bowel sounds MSK no focal spinal tenderness, no upper extremity lymphedema Neuro: nonfocal, well oriented, appropriate affect Breasts: Status post bilateral mastectomies.  There is no evidence of chest wall recurrence.  Both axillae are benign   LAB RESULTS:  CMP     Component Value Date/Time   NA 142 08/22/2020 1050   NA 140 07/08/2016 0948   K 4.0 08/22/2020 1050   K 4.1 07/08/2016 0948   CL 103 08/22/2020 1050   CO2 28 08/22/2020 1050   CO2 24 07/08/2016 0948   GLUCOSE 94 08/22/2020 1050   GLUCOSE 89 07/08/2016 0948   BUN 11 08/22/2020 1050   BUN 11.9 07/08/2016 0948   CREATININE 0.84 08/22/2020 1050   CREATININE 0.9 07/08/2016 0948   CALCIUM 9.5 08/22/2020 1050   CALCIUM 10.0 07/08/2016 0948   PROT 7.4 08/22/2020 1050   PROT 7.6 07/08/2016 0948   ALBUMIN 3.4 (L) 08/22/2020 1050   ALBUMIN 3.6 07/08/2016 0948   AST 16 08/22/2020 1050   AST 22 07/08/2016 0948   ALT 14 08/22/2020 1050   ALT 27 07/08/2016 0948   ALKPHOS 162 (H) 08/22/2020 1050   ALKPHOS 115 07/08/2016 0948   BILITOT 0.3 08/22/2020 1050   BILITOT 0.35 07/08/2016 0948   GFRNONAA >60 08/22/2020 1050   GFRAA >60 01/30/2020 0814   GFRAA >60 05/16/2019 0905    INo results found for: SPEP, UPEP  Lab Results  Component Value Date   WBC 5.0  09/10/2020   NEUTROABS 3.7 09/10/2020   HGB 11.6 (L) 09/10/2020   HCT 36.1 09/10/2020   MCV 93.3 09/10/2020   PLT 224 09/10/2020      Chemistry      Component Value Date/Time   NA 142 08/22/2020 1050   NA 140 07/08/2016 0948   K 4.0 08/22/2020 1050   K 4.1 07/08/2016 0948   CL 103 08/22/2020 1050   CO2 28 08/22/2020 1050   CO2 24 07/08/2016 0948   BUN 11 08/22/2020 1050   BUN 11.9  07/08/2016 0948   CREATININE 0.84 08/22/2020 1050   CREATININE 0.9 07/08/2016 0948      Component Value Date/Time   CALCIUM 9.5 08/22/2020 1050   CALCIUM 10.0 07/08/2016 0948   ALKPHOS 162 (H) 08/22/2020 1050   ALKPHOS 115 07/08/2016 0948   AST 16 08/22/2020 1050   AST 22 07/08/2016 0948   ALT 14 08/22/2020 1050   ALT 27 07/08/2016 0948   BILITOT 0.3 08/22/2020 1050   BILITOT 0.35 07/08/2016 0948       No results found for: LABCA2  No components found for: LABCA125  No results for input(s): INR in the last 168 hours.  Urinalysis No results found for: COLORURINE, APPEARANCEUR, LABSPEC, PHURINE, GLUCOSEU, HGBUR, BILIRUBINUR, KETONESUR, PROTEINUR, UROBILINOGEN, NITRITE, LEUKOCYTESUR   STUDIES: CT Chest W Contrast  Result Date: 09/04/2020 CLINICAL DATA:  Breast cancer.  Restaging. EXAM: CT CHEST WITH CONTRAST TECHNIQUE: Multidetector CT imaging of the chest was performed during intravenous contrast administration. CONTRAST:  12m OMNIPAQUE IOHEXOL 300 MG/ML  SOLN COMPARISON:  08/01/2019 FINDINGS: Cardiovascular: Heart size upper normal. No thoracic aortic aneurysm. Mediastinum/Nodes: New subcarinal lymphadenopathy with 1.7 cm short axis lymph node visible on 74/3. New lymphadenopathy in the right hilum measures 12 mm short axis on 78/3. No axillary lymphadenopathy. Lungs/Pleura: Stable right apical scarring. 9 mm left perifissural nodule identified previously is stable. Multiple new nodules are identified in the lungs bilaterally. 4 mm right upper lobe nodule on 56/6 is new in the interval. 5 mm right parahilar nodule on 78/6 is new. New 5 mm right lower lobe nodule on 104/6, 4 mm left upper lobe nodule on 46/6, and 4 mm left upper lobe nodule on 42/6. There is a new area of soft tissue pleural thickening in the left hemithorax laterally, best seen on image 79/3. Upper Abdomen: Multiple low-density liver lesions are compatible with metastases better characterized on abdominal MRI of  08/23/2020 Musculoskeletal: Similar appearance of mixed density bone lesions. Index T10 lesion measured previously at 13 mm is 14 mm today on image 110/3. IMPRESSION: 1. New mediastinal and right hilar lymphadenopathy with multiple new tiny bilateral pulmonary nodules. Imaging features are compatible with metastatic disease. 2. Interval development of soft tissue pleural thickening in the left hemithorax laterally, suspicious for metastatic involvement. Close attention on follow-up recommended. 3. Similar appearance of mixed density bone lesions. 4. Multiple hepatic lesions consistent with metastatic disease and better characterized on recent abdominal MRI. 5. Aortic Atherosclerosis (ICD10-I70.0). Electronically Signed   By: EMisty StanleyM.D.   On: 09/04/2020 10:57   MR Abdomen W Wo Contrast  Result Date: 08/26/2020 CLINICAL DATA:  Metastatic breast cancer to the liver restaging EXAM: MRI ABDOMEN WITHOUT AND WITH CONTRAST TECHNIQUE: Multiplanar multisequence MR imaging of the abdomen was performed both before and after the administration of intravenous contrast. CONTRAST:  125mGADAVIST GADOBUTROL 1 MMOL/ML IV SOLN COMPARISON:  05/15/2020 FINDINGS: Lower chest: On coronal images, I am suspicious for potential right eccentric  subcarinal adenopathy for example on image 25 of series 3 or possible lymph nodes in this vicinity have a short axis diameter of about 1.8 cm. Hepatobiliary: Compared to 05/15/2020, there are new and enlarging scattered hepatic metastatic lesions compatible with progressive metastatic disease. The index lesion in the caudate lobe measures 2.8 by 2.7 cm on image 14 series 4, previously 1.8 by 1.7 cm. A new subcapsular metastatic lesion in the right hepatic lobe measures 1.2 by 1.0 cm on image 22 of series 4. All told there are about 20 metastatic lesions in the liver visible currently. The gallbladder appears unremarkable. No biliary dilatation. Mild background hepatic steatosis. Pancreas:   Unremarkable Spleen:  Unremarkable Adrenals/Urinary Tract:  Unremarkable Stomach/Bowel: Unremarkable Vascular/Lymphatic:  Unremarkable Other: Abnormal hazy stranding in the central mesentery similar to prior exams, pattern tending to favor sclerosing mesenteritis. Musculoskeletal: Mild progression of osseous metastatic disease compared to 05/15/2020. For example, at the L1 level on image 48 of series 31, 3 lesions are not clearly visible whereas previously only a single lesion was readily visible. IMPRESSION: 1. Progressive worsening of hepatobiliary metastatic disease with new and enlarging lesions noted, about 20 scattered metastatic lesions visible in the liver currently. 2. Mild progression of osseous metastatic disease with some new lesions observed. 3. Chronic hazy stranding in the central mesentery favoring chronic sclerosing mesenteritis. 4. Based on the coronal images, I am suspicious for the possibility of subcarinal adenopathy eccentric to the right. Electronically Signed   By: Van Clines M.D.   On: 08/26/2020 08:46     ELIGIBLE FOR AVAILABLE RESEARCH PROTOCOL: PALLAS, but inelegible because of Mirena IUD   ASSESSMENT: 55 y.o. Debbie [name] status post right breast upper inner quadrant biopsy 10/15/2015 for a clinical T2-T3 N0, stage II invasive ductal carcinoma, estrogen and progesterone receptor positive, HER-2 nonamplified, with an Mib-1 of 70%  (1) status post right lumpectomy and sentinel lymph node sampling with oncoplastic breast reduction 11/28/2015 for a pT2 pN1, stage IIB invasive ductal carcinoma, grade 1, with negative margins (1/1 sentinel node positive)  (a) reclassified as stage IIA in the 2018 prognostic revision   (2) left reduction mammoplasty 11/28/2015 unexpectedly found a pT1c pNX invasive ductal carcinoma, grade 1, estrogen and progesterone receptor positive, HER-2 not amplified, with an MIB-1 of 3%  (3) Mammaprint from the right sided breast cancer was "low  risk", predicting a 98% chance of no disease recurrence within 5 years with anti-estrogens as the only systemic therapy. It also predicts minimal to no benefit from chemotherapy  (4) status post bilateral mastectomies with bilateral sentinel lymph node sampling 12/26/2015, showing  (a) on the right, no residual carcinoma (0/3 nodes involved)  (b) on the left, focal ductal carcinoma in situ, 0.2 cm, with negative margins; (0/4 nodes involved)  (5) adjuvant radiation 02/11/16 - 03/27/16    1) Right chest wall: 50.4 Gy in 28 fractions               2) Right chest wall boost: 10 Gy in 5 fractions   (6) started on tamoxifen neoadjuvantly 10/23/2015 to allow for expected delays in definitive local treatment  (a) Mirena IUD in place  (b) tamoxifen discontinued 08/13/2017, with progression  (7) since she had 4 lymph nodes removed from each axilla but also receive radiation on the right, lab draws should preferentially be obtained from the left arm   METASTATIC DISEASE: April 2019 (8) CT scan of the abdomen and pelvis 08/04/2017, MRI of the liver and total spine MRI  08/11/2017 showed liver and bone metastases  (a) liver biopsy 09/07/2017 confirms estrogen receptor positive, progesterone receptor negative, HER-2 negative metastatic breast cancer  (b) chest CT scan 08/13/2017 shows multiple bilateral pulmonary nodules.  (c) CT of the brain with and without contrast 08/26/2017 shows no evidence of metastatic disease to the brain  (d) CA 27-29 is informative  (9) fulvestrant started 08/17/2017  (a) palbociclib started 08/31/2017  (b) fulvestrant and palbociclib discontinued 01/04/2018 with evidence of progression in the liver  (10) denosumab/Xgeva started 09/14/2017  (a) held after 03/01/2018 dose because concern re. osteonecrosis  (b) resumed 06/09/2018  (c) 02/21/2019 dose held due to oral surgery, resumed 03/21/2019  (11) anastrozole started 01/04/2018  (a) everolimus started 01/29/2018  (b)  liver MRI and bone scan December 2019 show stable disease  (c) chest CT scan on 08/02/2018 shows lung lesions resolved, bone lesions stable  (d) bone scan 09/13/2018 essentially stable  (e) liver MRI 12/08/2018 shows improvement in hepatic metastsases  (f) liver MRI and bone scan 04/13/2019 showed continuing improvement in the liver, stable bone lesions  (g) chest CT 08/01/2019 stable, no evidence of active disease ows  (12) 01/28/18 prophylactic left femur intramedullary nail surgery for impending left femur pathologic fracture  (a) palliative radiation with Dr. [name] to the left femur and left ilium from 02/16/2018 to 03/01/2018  (13) [facility] report on liver biopsy from April 2019 shows a negative PDL 1, stable MSI and proficient mismatch repair status.  The tumor is positive for the androgen receptor, and for PTEN for PIK3 CA mutations.  HER-2/neu was read as equivocal (it was negative by FISH on the pathology report).  (14) Progression noted in liver MRI on 01/11/2020; fulvestrant started on 01/30/2020  (a) alpelisib started 01/30/2020 at 300 mg daily with Metformin 500 mg daily  (b) alpelisib held on 02/07/2020 with very high blood sugars.  Metformin increased to 1 g daily  (c) alpelisib resumed at 150 mg daily on 02/13/2020  (d) liver MRI 05/06/2020 shows evidence of disease progression  (e) fulvestrant and alpelisib discontinued January 2022  (15) capecitabine started 05/22/2020 at 1.5 g twice daily, 14/7  (a) abdominal MRI 08/23/2020 shows worsening liver disease  (b) chest CT scan 09/04/2020 shows new lung metastases  (c) capecitabine discontinued April 2022  (16) paclitaxel started 09/10/2020, repeated days 1, 8, and 15 of each 28-day cycle   PLAN: I reviewed the results of his CT of the chest with Debbie [name] and she understands now there is evidence of metastatic disease to the lungs as well as progression in the liver.  We are going to start paclitaxel today even though she will not  have her port placed until 09/16/2020.  We discussed the possibility of phlebitis.  She has a good understanding of the possible toxicities side effects and complications of this agent.  I offered to write her a prescription for a week but she thinks she will not need that.  Were going to do some cryo prevention which hopefully will prevent or postpone the development of worsening peripheral neuropathy.  We discussed management of her constipation.  As far as nausea is concerned she is going to take prochlorperazine tonight and tomorrow morning and then as needed.  I have asked her to keep a symptom diary and she will see me in 1 week with the day 8 cycle 1 treatment to troubleshoot any problems that may have arisen.  I am not sure she is going to be able to continue to  work but she would like to give it a try.  Certainly if she applied for disability she would receive it.  She wanted to know her prognosis.  She understands her disease is not curable.  Taxol is a very effective drug and I am hoping when we do restaging studies in 66month we will see evidence of response.  If that does not work there will be other options which however may be more problematic for her.  Total encounter time 35 minutes.*Sarajane JewsC. [name], MD 09/10/20 8:09 AM Medical On[facility] [id number]. [phone]   Fax. [phone]  I, KWilburn Mylar am acting as scribe for Dr. GVirgie [name] [name].  I, GLurline DelMD, have reviewed the above documentation for accuracy and completeness, and I agree with the above.   *Total Encounter Time as defined by the Centers for Medicare and Medicaid Services includes, in addition to the face-to-face time of a patient visit (documented in the note above) non-face-to-face time: obtaining and reviewing outside history, ordering and reviewing medications, tests or procedures, care coordination  (communications with other health care professionals or caregivers) and documentation in the medical record.

## 2020-09-10 ENCOUNTER — Other Ambulatory Visit: Payer: Self-pay

## 2020-09-10 ENCOUNTER — Inpatient Hospital Stay: Payer: 59 | Attending: Oncology

## 2020-09-10 ENCOUNTER — Inpatient Hospital Stay (HOSPITAL_BASED_OUTPATIENT_CLINIC_OR_DEPARTMENT_OTHER): Payer: 59 | Admitting: Oncology

## 2020-09-10 ENCOUNTER — Inpatient Hospital Stay: Payer: 59

## 2020-09-10 VITALS — BP 163/73 | HR 84 | Temp 97.7°F | Resp 18 | Ht 66.5 in | Wt 279.0 lb

## 2020-09-10 VITALS — BP 158/91 | HR 86

## 2020-09-10 DIAGNOSIS — C50112 Malignant neoplasm of central portion of left female breast: Secondary | ICD-10-CM

## 2020-09-10 DIAGNOSIS — Z79899 Other long term (current) drug therapy: Secondary | ICD-10-CM | POA: Diagnosis not present

## 2020-09-10 DIAGNOSIS — Z170 Estrogen receptor positive status [ER+]: Secondary | ICD-10-CM

## 2020-09-10 DIAGNOSIS — C50211 Malignant neoplasm of upper-inner quadrant of right female breast: Secondary | ICD-10-CM | POA: Diagnosis not present

## 2020-09-10 DIAGNOSIS — C787 Secondary malignant neoplasm of liver and intrahepatic bile duct: Secondary | ICD-10-CM | POA: Diagnosis not present

## 2020-09-10 DIAGNOSIS — C7951 Secondary malignant neoplasm of bone: Secondary | ICD-10-CM | POA: Insufficient documentation

## 2020-09-10 DIAGNOSIS — C7800 Secondary malignant neoplasm of unspecified lung: Secondary | ICD-10-CM | POA: Insufficient documentation

## 2020-09-10 DIAGNOSIS — Z923 Personal history of irradiation: Secondary | ICD-10-CM | POA: Insufficient documentation

## 2020-09-10 DIAGNOSIS — Z5111 Encounter for antineoplastic chemotherapy: Secondary | ICD-10-CM | POA: Insufficient documentation

## 2020-09-10 LAB — CBC WITH DIFFERENTIAL/PLATELET
Abs Immature Granulocytes: 0.03 10*3/uL (ref 0.00–0.07)
Basophils Absolute: 0 10*3/uL (ref 0.0–0.1)
Basophils Relative: 1 %
Eosinophils Absolute: 0.2 10*3/uL (ref 0.0–0.5)
Eosinophils Relative: 4 %
HCT: 36.1 % (ref 36.0–46.0)
Hemoglobin: 11.6 g/dL — ABNORMAL LOW (ref 12.0–15.0)
Immature Granulocytes: 1 %
Lymphocytes Relative: 13 %
Lymphs Abs: 0.7 10*3/uL (ref 0.7–4.0)
MCH: 30 pg (ref 26.0–34.0)
MCHC: 32.1 g/dL (ref 30.0–36.0)
MCV: 93.3 fL (ref 80.0–100.0)
Monocytes Absolute: 0.5 10*3/uL (ref 0.1–1.0)
Monocytes Relative: 9 %
Neutro Abs: 3.7 10*3/uL (ref 1.7–7.7)
Neutrophils Relative %: 72 %
Platelets: 224 10*3/uL (ref 150–400)
RBC: 3.87 MIL/uL (ref 3.87–5.11)
RDW: 16.9 % — ABNORMAL HIGH (ref 11.5–15.5)
WBC: 5 10*3/uL (ref 4.0–10.5)
nRBC: 0 % (ref 0.0–0.2)

## 2020-09-10 LAB — COMPREHENSIVE METABOLIC PANEL
ALT: 17 U/L (ref 0–44)
AST: 20 U/L (ref 15–41)
Albumin: 3.3 g/dL — ABNORMAL LOW (ref 3.5–5.0)
Alkaline Phosphatase: 162 U/L — ABNORMAL HIGH (ref 38–126)
Anion gap: 11 (ref 5–15)
BUN: 11 mg/dL (ref 6–20)
CO2: 28 mmol/L (ref 22–32)
Calcium: 9.2 mg/dL (ref 8.9–10.3)
Chloride: 102 mmol/L (ref 98–111)
Creatinine, Ser: 0.82 mg/dL (ref 0.44–1.00)
GFR, Estimated: >60 mL/min (ref >60)
Glucose, Bld: 95 mg/dL (ref 70–99)
Potassium: 3.4 mmol/L — ABNORMAL LOW (ref 3.5–5.1)
Sodium: 141 mmol/L (ref 135–145)
Total Bilirubin: 0.3 mg/dL (ref 0.3–1.2)
Total Protein: 7.4 g/dL (ref 6.5–8.1)

## 2020-09-10 MED ORDER — DIPHENHYDRAMINE HCL 50 MG/ML IJ SOLN
25.0000 mg | Freq: Once | INTRAMUSCULAR | Status: AC
  Administered 2020-09-10: 25 mg via INTRAVENOUS

## 2020-09-10 MED ORDER — DEXAMETHASONE SODIUM PHOSPHATE 10 MG/ML IJ SOLN
INTRAMUSCULAR | Status: AC
  Filled 2020-09-10: qty 1

## 2020-09-10 MED ORDER — SODIUM CHLORIDE 0.9 % IV SOLN
10.0000 mg | Freq: Once | INTRAVENOUS | Status: AC
  Administered 2020-09-10: 10 mg via INTRAVENOUS
  Filled 2020-09-10: qty 10

## 2020-09-10 MED ORDER — FAMOTIDINE IN NACL 20-0.9 MG/50ML-% IV SOLN
INTRAVENOUS | Status: AC
  Filled 2020-09-10: qty 50

## 2020-09-10 MED ORDER — SODIUM CHLORIDE 0.9 % IV SOLN
Freq: Once | INTRAVENOUS | Status: AC
  Filled 2020-09-10: qty 250

## 2020-09-10 MED ORDER — SODIUM CHLORIDE 0.9 % IV SOLN
80.0000 mg/m2 | Freq: Once | INTRAVENOUS | Status: AC
  Administered 2020-09-10: 192 mg via INTRAVENOUS
  Filled 2020-09-10: qty 32

## 2020-09-10 MED ORDER — FAMOTIDINE IN NACL 20-0.9 MG/50ML-% IV SOLN
20.0000 mg | Freq: Once | INTRAVENOUS | Status: AC
  Administered 2020-09-10: 20 mg via INTRAVENOUS

## 2020-09-10 MED ORDER — DIPHENHYDRAMINE HCL 50 MG/ML IJ SOLN
INTRAMUSCULAR | Status: AC
  Filled 2020-09-10: qty 1

## 2020-09-10 NOTE — Patient Instructions (Signed)
[facility] MEDICAL [facility]COLOGY  Discharge Instructions: Thank you for choosing [facility] to provide your oncology and hematology care.   If you have a lab appointment with the Cancer Center, please go directly to the Cancer Center and check in at the registration area.   Wear comfortable clothing and clothing appropriate for easy access to any Portacath or PICC line.   We strive to give you quality time with your provider. You may need to reschedule your appointment if you arrive late (15 or more minutes).  Arriving late affects you and other patients whose appointments are after yours.  Also, if you miss three or more appointments without notifying the office, you may be dismissed from the clinic at the provider's discretion.      For prescription refill requests, have your pharmacy contact our office and allow 72 hours for refills to be completed.    Today you received the following chemotherapy and/or immunotherapy agents paclitaxel   To help prevent nausea and vomiting after your treatment, we encourage you to take your nausea medication as directed.  BELOW ARE SYMPTOMS THAT SHOULD BE REPORTED IMMEDIATELY: *FEVER GREATER THAN 100.4 F (38 C) OR HIGHER *CHILLS OR SWEATING *NAUSEA AND VOMITING THAT IS NOT CONTROLLED WITH YOUR NAUSEA MEDICATION *UNUSUAL SHORTNESS OF BREATH *UNUSUAL BRUISING OR BLEEDING *URINARY PROBLEMS (pain or burning when urinating, or frequent urination) *BOWEL PROBLEMS (unusual diarrhea, constipation, pain near the anus) TENDERNESS IN MOUTH AND THROAT WITH OR WITHOUT PRESENCE OF ULCERS (sore throat, sores in mouth, or a toothache) UNUSUAL RASH, SWELLING OR PAIN  UNUSUAL VAGINAL DISCHARGE OR ITCHING   Items with * indicate a potential emergency and should be followed up as soon as possible or go to the Emergency Department if any problems should occur.  Please show the CHEMOTHERAPY ALERT CARD or IMMUNOTHERAPY ALERT CARD at check-in to the  Emergency Department and triage nurse.  Should you have questions after your visit or need to cancel or reschedule your appointment, please contact [facility]ICAL [facility]  Dept: [phone]  and follow the prompts.  Office hours are 8:00 a.m. to 4:30 p.m. Monday - Friday. Please note that voicemails left after 4:00 p.m. may not be returned until the following business day.  We are closed weekends and major holidays. You have access to a nurse at all times for urgent questions. Please call the main number to the clinic Dept: [phone] and follow the prompts.   For any non-urgent questions, you may also contact your provider using MyChart. We now offer e-Visits for anyone 18 and older to request care online for non-urgent symptoms. For details visit [email or web address].   Also download the MyChart app! Go to the app store, search "MyChart", open the app, select [facility], and log in with your MyChart username and password.  Due to Covid, a mask is required upon entering the hospital/clinic. If you do not have a mask, one will be given to you upon arrival. For doctor visits, patients may have 1 support person aged 18 or older with them. For treatment visits, patients cannot have anyone with them due to current Covid guidelines and our immunocompromised population.   Paclitaxel injection What is this medicine? PACLITAXEL (PAK li TAX el) is a chemotherapy drug. It targets fast dividing cells, like cancer cells, and causes these cells to die. This medicine is used to treat ovarian cancer, breast cancer, lung cancer, Kaposi's sarcoma, and other cancers. This medicine may be used for   lung cancer, Kaposi's sarcoma, and other cancers. This medicine may be used for other purposes; ask your health care provider or pharmacist if you have questions. COMMON BRAND NAME(S): Onxol, Taxol What should I tell my health care provider before I take this medicine? They need to know if you have any of these conditions:  history of irregular  heartbeat  liver disease  low blood counts, like low white cell, platelet, or red cell counts  lung or breathing disease, like asthma  tingling of the fingers or toes, or other nerve disorder  an unusual or allergic reaction to paclitaxel, alcohol, polyoxyethylated castor oil, other chemotherapy, other medicines, foods, dyes, or preservatives  pregnant or trying to get pregnant  breast-feeding How should I use this medicine? This drug is given as an infusion into a vein. It is administered in a hospital or clinic by a specially trained health care professional. Talk to your pediatrician regarding the use of this medicine in children. Special care may be needed. Overdosage: If you think you have taken too much of this medicine contact a poison control center or emergency room at once. NOTE: This medicine is only for you. Do not share this medicine with others. What if I miss a dose? It is important not to miss your dose. Call your doctor or health care professional if you are unable to keep an appointment. What may interact with this medicine? Do not take this medicine with any of the following medications:  live virus vaccines This medicine may also interact with the following medications:  antiviral medicines for hepatitis, HIV or AIDS  certain antibiotics like erythromycin and clarithromycin  certain medicines for fungal infections like ketoconazole and itraconazole  certain medicines for seizures like carbamazepine, phenobarbital, phenytoin  gemfibrozil  nefazodone  rifampin  St. John's wort This list may not describe all possible interactions. Give your health care provider a list of all the medicines, herbs, non-prescription drugs, or dietary supplements you use. Also tell them if you smoke, drink alcohol, or use illegal drugs. Some items may interact with your medicine. What should I watch for while using this medicine? Your condition will be monitored carefully  while you are receiving this medicine. You will need important blood work done while you are taking this medicine. This medicine can cause serious allergic reactions. To reduce your risk you will need to take other medicine(s) before treatment with this medicine. If you experience allergic reactions like skin rash, itching or hives, swelling of the face, lips, or tongue, tell your doctor or health care professional right away. In some cases, you may be given additional medicines to help with side effects. Follow all directions for their use. This drug may make you feel generally unwell. This is not uncommon, as chemotherapy can affect healthy cells as well as cancer cells. Report any side effects. Continue your course of treatment even though you feel ill unless your doctor tells you to stop. Call your doctor or health care professional for advice if you get a fever, chills or sore throat, or other symptoms of a cold or flu. Do not treat yourself. This drug decreases your body's ability to fight infections. Try to avoid being around people who are sick. This medicine may increase your risk to bruise or bleed. Call your doctor or health care professional if you notice any unusual bleeding. Be careful brushing and flossing your teeth or using a toothpick because you may get an infection or bleed more easily. If you  have any dental work done, tell your dentist you are receiving this medicine. Avoid taking products that contain aspirin, acetaminophen, ibuprofen, naproxen, or ketoprofen unless instructed by your doctor. These medicines may hide a fever. Do not become pregnant while taking this medicine. Women should inform their doctor if they wish to become pregnant or think they might be pregnant. There is a potential for serious side effects to an unborn child. Talk to your health care professional or pharmacist for more information. Do not breast-feed an infant while taking this medicine. Men are advised not  to father a child while receiving this medicine. This product may contain alcohol. Ask your pharmacist or healthcare provider if this medicine contains alcohol. Be sure to tell all healthcare providers you are taking this medicine. Certain medicines, like metronidazole and disulfiram, can cause an unpleasant reaction when taken with alcohol. The reaction includes flushing, headache, nausea, vomiting, sweating, and increased thirst. The reaction can last from 30 minutes to several hours. What side effects may I notice from receiving this medicine? Side effects that you should report to your doctor or health care professional as soon as possible:  allergic reactions like skin rash, itching or hives, swelling of the face, lips, or tongue  breathing problems  changes in vision  fast, irregular heartbeat  high or low blood pressure  mouth sores  pain, tingling, numbness in the hands or feet  signs of decreased platelets or bleeding - bruising, pinpoint red spots on the skin, black, tarry stools, blood in the urine  signs of decreased red blood cells - unusually weak or tired, feeling faint or lightheaded, falls  signs of infection - fever or chills, cough, sore throat, pain or difficulty passing urine  signs and symptoms of liver injury like dark yellow or brown urine; general ill feeling or flu-like symptoms; light-colored stools; loss of appetite; nausea; right upper belly pain; unusually weak or tired; yellowing of the eyes or skin  swelling of the ankles, feet, hands  unusually slow heartbeat Side effects that usually do not require medical attention (report to your doctor or health care professional if they continue or are bothersome):  diarrhea  hair loss  loss of appetite  muscle or joint pain  nausea, vomiting  pain, redness, or irritation at site where injected  tiredness This list may not describe all possible side effects. Call your doctor for medical advice about  side effects. You may report side effects to FDA at 1-800-FDA-1088. Where should I keep my medicine? This drug is given in a hospital or clinic and will not be stored at home. NOTE: This sheet is a summary. It may not cover all possible information. If you have questions about this medicine, talk to your doctor, pharmacist, or health care provider.  2021 [employer] Standard (2019-03-29 13:37:23)

## 2020-09-11 ENCOUNTER — Telehealth: Payer: Self-pay | Admitting: *Deleted

## 2020-09-11 LAB — CANCER ANTIGEN 27.29: CA 27.29: 316.9 U/mL — ABNORMAL HIGH (ref 0.0–38.6)

## 2020-09-13 ENCOUNTER — Other Ambulatory Visit: Payer: Self-pay | Admitting: Student

## 2020-09-16 ENCOUNTER — Other Ambulatory Visit: Payer: Self-pay | Admitting: Oncology

## 2020-09-16 ENCOUNTER — Encounter (HOSPITAL_COMMUNITY): Payer: Self-pay

## 2020-09-16 ENCOUNTER — Other Ambulatory Visit (HOSPITAL_COMMUNITY): Payer: Self-pay

## 2020-09-16 ENCOUNTER — Ambulatory Visit (HOSPITAL_COMMUNITY)
Admission: RE | Admit: 2020-09-16 | Discharge: 2020-09-16 | Disposition: A | Discharge status: Home / Self Care | Payer: 59 | Source: Ambulatory Visit | Attending: Oncology | Admitting: Oncology

## 2020-09-16 ENCOUNTER — Other Ambulatory Visit: Payer: Self-pay

## 2020-09-16 DIAGNOSIS — C787 Secondary malignant neoplasm of liver and intrahepatic bile duct: Secondary | ICD-10-CM | POA: Diagnosis not present

## 2020-09-16 DIAGNOSIS — Z170 Estrogen receptor positive status [ER+]: Secondary | ICD-10-CM | POA: Diagnosis not present

## 2020-09-16 DIAGNOSIS — C50911 Malignant neoplasm of unspecified site of right female breast: Secondary | ICD-10-CM | POA: Diagnosis not present

## 2020-09-16 DIAGNOSIS — Z7984 Long term (current) use of oral hypoglycemic drugs: Secondary | ICD-10-CM | POA: Diagnosis not present

## 2020-09-16 DIAGNOSIS — C50112 Malignant neoplasm of central portion of left female breast: Secondary | ICD-10-CM | POA: Insufficient documentation

## 2020-09-16 DIAGNOSIS — Z9013 Acquired absence of bilateral breasts and nipples: Secondary | ICD-10-CM | POA: Diagnosis not present

## 2020-09-16 DIAGNOSIS — Z452 Encounter for adjustment and management of vascular access device: Secondary | ICD-10-CM | POA: Diagnosis not present

## 2020-09-16 DIAGNOSIS — Z888 Allergy status to other drugs, medicaments and biological substances status: Secondary | ICD-10-CM | POA: Insufficient documentation

## 2020-09-16 DIAGNOSIS — E785 Hyperlipidemia, unspecified: Secondary | ICD-10-CM | POA: Insufficient documentation

## 2020-09-16 DIAGNOSIS — C7951 Secondary malignant neoplasm of bone: Secondary | ICD-10-CM | POA: Diagnosis not present

## 2020-09-16 DIAGNOSIS — Z79899 Other long term (current) drug therapy: Secondary | ICD-10-CM | POA: Diagnosis not present

## 2020-09-16 DIAGNOSIS — I10 Essential (primary) hypertension: Secondary | ICD-10-CM | POA: Diagnosis not present

## 2020-09-16 DIAGNOSIS — Z79890 Hormone replacement therapy (postmenopausal): Secondary | ICD-10-CM | POA: Diagnosis not present

## 2020-09-16 HISTORY — PX: IR IMAGING GUIDED PORT INSERTION: IMG5740

## 2020-09-16 MED ORDER — FENTANYL CITRATE (PF) 100 MCG/2ML IJ SOLN
INTRAMUSCULAR | Status: AC | PRN
  Administered 2020-09-16 (×2): 50 ug via INTRAVENOUS

## 2020-09-16 MED ORDER — ONDANSETRON HCL 4 MG/2ML IJ SOLN
INTRAMUSCULAR | Status: AC | PRN
  Administered 2020-09-16: 4 mg via INTRAVENOUS

## 2020-09-16 MED ORDER — MIDAZOLAM HCL 2 MG/2ML IJ SOLN
INTRAMUSCULAR | Status: AC | PRN
  Administered 2020-09-16 (×2): 1 mg via INTRAVENOUS

## 2020-09-16 MED ORDER — LIDOCAINE-EPINEPHRINE 1 %-1:100000 IJ SOLN
INTRAMUSCULAR | Status: AC
  Filled 2020-09-16: qty 1

## 2020-09-16 MED ORDER — SODIUM CHLORIDE 0.9 % IV SOLN: INTRAVENOUS | Status: DC

## 2020-09-16 MED ORDER — LIDOCAINE-EPINEPHRINE 1 %-1:100000 IJ SOLN
INTRAMUSCULAR | Status: AC | PRN
  Administered 2020-09-16: 10 mL

## 2020-09-16 MED ORDER — FENTANYL CITRATE (PF) 100 MCG/2ML IJ SOLN
INTRAMUSCULAR | Status: AC
  Filled 2020-09-16: qty 2

## 2020-09-16 MED ORDER — HEPARIN SOD (PORK) LOCK FLUSH 100 UNIT/ML IV SOLN
INTRAVENOUS | Status: AC
  Filled 2020-09-16: qty 5

## 2020-09-16 MED ORDER — ONDANSETRON HCL 4 MG/2ML IJ SOLN
INTRAMUSCULAR | Status: AC
  Filled 2020-09-16: qty 2

## 2020-09-16 MED ORDER — MIDAZOLAM HCL 2 MG/2ML IJ SOLN
INTRAMUSCULAR | Status: AC
  Filled 2020-09-16: qty 4

## 2020-09-16 NOTE — Procedures (Signed)
Pre Procedure Dx: Poor venous access Post Procedural Dx: Same  Successful placement of right IJ approach port-a-cath with tip at the superior caval atrial junction. The catheter is ready for immediate use.  Estimated Blood Loss: Trace  Complications: None immediate.  Jay Debie Ashline, MD Pager #: [phone]   

## 2020-09-16 NOTE — Progress Notes (Signed)
[place]  [phone] [phone]    ID: Debbie [name] DOB: Mar 31, 1966  MR#: [id number]  MLJ#:[id number]  Patient Care Team: Kelton Pillar, MD as PCP - General (Family Medicine) Jacelyn Pi, MD as Consulting Physician (Endocrinology) Jovita Kussmaul, MD as Consulting Physician (General Surgery) Alfrieda Tarry, Virgie Dad, MD as Consulting Physician (Oncology) [name], MD as Attending Physician (Radiation Oncology) Belva Crome, MD as Consulting Physician (Cardiology) Crissie Reese, MD as Consulting Physician (Plastic Surgery) [name], [name], NP as Nurse Practitioner (Hematology and Oncology) Lajoyce Lauber, DMD (Dentistry) OTHER MD:   CHIEF COMPLAINT: Estrogen receptor positive stage IV  breast cancer (s/p bilateral mastectomies)  CURRENT TREATMENT: paclitaxel; denosumab/Xgeva   INTERVAL HISTORY: Debbie [name] returns today for follow-up of her estrogen receptor positive stage IV breast cancer.  She was switched to weekly paclitaxel at her last visit on 09/10/2020.  Today is cycle 1 day 8.  She receives treatments days 1, 8 and 15 of each 28-day cycle  She continues on Xgeva every 4 weeks, with her most recent dose on 08/22/2020.  She has no side effects from this that she is aware of  Lab Results  Component Value Date   CA2729 316.9 (H) 09/10/2020   CA2729 177.3 (H) 08/22/2020   CA2729 117.5 (H) 07/18/2020   CA2729 76.3 (H) 06/20/2020   CA2729 63.2 (H) 05/21/2020    REVIEW OF SYSTEMS: Debbie [name] did generally quite well with her first dose of paclitaxel.  She was able to get back to work on day 3 and 4.  On day 5 she had significant bone pain which was only with difficulty controlled with Aleve but by the end of the day the pain was completely gone and has not recurred.  She has not started to lose her hair.  She has a little bit of baseline neuropathy involving her toes, the balls of her feet and her left pinky but that is  unchanged.  She had mild soft stools and then mild constipation.  She had no nausea no vomiting.  Also yesterday she had her port placed.  She is a bit sore from that but otherwise she has had no complications.  She is ready to proceed to treatment today.   COVID 19 VACCINATION STATUS: Status post Pfizer x2, with booster January 2021   BREAST CANCER HISTORY: From the original intake note:  Debbie [name] had screening mammography with tomosynthesis at [facility] 10/02/2015. This showed 2 possible masses in the right breast as well as some suspicious calcifications. The left breast was unremarkable. On 10/15/2015 she underwent right diagnostic mammography with tomography and right breast ultrasonography. The breast density was category B. Spot magnification views confirmed an area of pleomorphic calcifications measuring up to 5 cm in the upper half of the breast. Also in the upper outer right breast there were adjacent low-density masses without distortion or calcifications. On physical exam there was no palpable abnormality.  Targeted ultrasound of the right breast found the 2 "masses" in the right upper quadrant were benign cysts measuring 0.8 and 0.5 cm respectively. Biopsy of the area of calcification on 10/15/2015 showed ([id number]) invasive ductal carcinoma, E-cadherin positive, grade 2, estrogen receptor 90% positive, progesterone receptor 50% positive, both with strong staining intensity, with an MIB-1 of 70%, and no HER-2 amplification, the signals ratio being 1.18 and the number per cell 2.60.  Her subsequent history is as detailed below    PAST MEDICAL HISTORY: Past Medical History:  Diagnosis  Date  . Anxiety   . Arthritis   . Bone metastasis ([place])   . Breast cancer of upper-inner quadrant of right female breast [facility]) oncologist-  dr [name]--- 04/ 2019 ER/PR+ Stage IV w/ metastatic disease to liver, total spine, lung, bone   dx 06/ 2017  right breast invasive ductal carcinoma,  Stage IIB, ER/PR + (pT2 pN1), grade 1-- s/p  right breast lumpecotmy w/ sln bx's and bilateral breast reduction 11-28-2015/  left breast with reduction , dx invasive ductal ca, Grade 1 (pT1cpNX) ER/PR positive/  adjuvant radiation completed 03-27-2016 to right chest wall, 2018  reclassifiec Stage IIA  . Cancer, metastatic to liver ([place])   . Chronic back pain   . Constipation   . Depression   . Family history of adverse reaction to anesthesia     mother has Copd was kept on vent   . Headache   . Heart murmur   . History of hyperthyroidism    s/p  RAI  . History of radiation therapy 02/11/16- 03/27/16   Right Chest Wall 50.4 Gy in 28 fractions, Right Chest Wall Boost 10 Gy in 5 fractions.   . Hyperlipidemia   . Hypertension   . Hypothyroidism, postradioiodine therapy    endocrinoloigst-  dr balan--  2004  s/p  RAI  . Menorrhagia 2011  . Multiple pulmonary nodules    secondary  metastatic  . PONV (postoperative nausea and vomiting)    patch helps   . SUI (stress urinary incontinence, female)   . Wears contact lenses   . Wears dentures    full upper and lower partial    PAST SURGICAL HISTORY: Past Surgical History:  Procedure Laterality Date  . BREAST LUMPECTOMY WITH NEEDLE LOCALIZATION AND AXILLARY SENTINEL LYMPH NODE BX Right 11/28/2015   Procedure: RIGHT BREAST LUMPECTOMY WITH NEEDLE LOCALIZATION AND AXILLARY SENTINEL LYMPH NODE BX;  Surgeon: Autumn Messing III, MD;  Location: [place];  Service: General;  Laterality: Right;  . BREAST REDUCTION SURGERY Bilateral 11/28/2015   Procedure: MAMMARY REDUCTION  (BREAST) BILATERAL;  Surgeon: Crissie Reese, MD;  Location: [place];  Service: Plastics;  Laterality: Bilateral;  . CESAREAN SECTION  01/12/1993  . COLONOSCOPY    . Dental surgeries    . FEMUR IM NAIL Left 01/28/2018   Procedure: INTRAMEDULLARY (IM) NAIL FEMORAL;  Surgeon: Nicholes Stairs, MD;  Location: [place];  Service: Orthopedics;  Laterality: Left;  . HYSTEROSCOPY N/A 09/02/2017    Procedure: HYSTEROSCOPY  to remove IUD;  Surgeon: Eldred Manges, MD;  Location: [facility];  Service: Gynecology;  Laterality: N/A;  . IR IMAGING GUIDED PORT INSERTION  09/16/2020  . Liver biospy    . MASTECTOMY    . MASTECTOMY W/ SENTINEL NODE BIOPSY Bilateral 12/26/2015   Procedure: BILATERAL MASTECTOMY WITH LEFT SENTINEL LYMPH NODE BIOPSY;  Surgeon: Autumn Messing III, MD;  Location: [place];  Service: General;  Laterality: Bilateral;  . Removal of IUD    . TUBAL LIGATION Bilateral 1995    FAMILY HISTORY: Family History  Problem Relation Age of Onset  . Diabetes Mother   . Hypertension Mother   . Hypertension Father   . Diabetes Maternal Grandmother   . Hypertension Sister   . Cancer Paternal Grandmother        colon  . Colon cancer Paternal Grandmother   . Esophageal cancer Neg Hx   . Stomach cancer Neg Hx   . Rectal cancer Neg Hx   The patient's parents are  still living, in their early 46s as of June 2017. The patient has one brother, 2 sisters. On the maternal side there is a history of uterine and prostate cancer. On the paternal side there is a history of colon cancer and possibly uterine cancer. There is no history of breast or ovarian cancer in the family.    GYNECOLOGIC HISTORY:  Patient's last menstrual period was 12/19/2015.  Menarche age 76. She still having regular periods. She is GX P1, first pregnancy age 55. She used oral contraceptives for a few years remotely, with no complications. She is currently on a Mirena IUD and also is status post bilateral tubal ligation.   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 Her husband [name] is a Administrator.  They are both taking appropriate precautions at work.  Their son [name], age 76, is a radio DJ.  He no longer lives with the patient.  The patient has no grandchildren. She is not a Ambulance person.   ADVANCED DIRECTIVES: In the absence of any documents to the  contrary her husband is her healthcare power of attorney   HEALTH MAINTENANCE: Social History   Tobacco Use  . Smoking status: Never Smoker  . Smokeless tobacco: Never Used  Vaping Use  . Vaping Use: Never used  Substance Use Topics  . Alcohol use: No  . Drug use: No     Colonoscopy: January 2016   PAP: January 2016   Bone density: August 2017 showed a T score of +0.7 normal  Lipid panel:  Allergies  Allergen Reactions  . Ace Inhibitors Cough  . Atorvastatin Other (See Comments)    Joint pain  . Simvastatin Other (See Comments)    Joint pain    Current Outpatient Medications  Medication Sig Dispense Refill  . valACYclovir (VALTREX) 1000 MG tablet Take 1 tablet (1,000 mg total) by mouth daily. 60 tablet 6  . blood glucose meter kit and supplies KIT Dispense based on patient and insurance preference. Use up to four times daily as directed. (FOR ICD-9 250.00, 250.01). 1 each 0  . Calcium-Magnesium 250-125 MG TABS Take 1 tablet by mouth daily. 120 each 4  . cetirizine (ZYRTEC) 10 MG tablet Take 10 mg by mouth daily as needed for allergies.     . chlorhexidine (PERIDEX) 0.12 % solution Use 5 mLs in the mouth or throat 2 (two) times daily as needed as directed. Do not swallow 473 mL 6  . Ferrous Sulfate (IRON) 325 (65 Fe) MG TABS Take 1 tablet (325 mg total) by mouth daily. 30 each 2  . fluconazole (DIFLUCAN) 100 MG tablet TAKE 2 TABLETS BY MOUTH FOR THE 1ST DOSE THEN 1 TABLET DAILY 8 tablet 0  . furosemide (LASIX) 20 MG tablet Take 20 mg by mouth daily as needed.    Marland Kitchen glucose blood test strip USE AS DIRECTED TO TEST UP TO FOUR TIMES DAILY (Patient taking differently: USE AS DIRECTED TO TEST UP TO FOUR TIMES DAILY) 100 strip 0  . ibuprofen (ADVIL) 800 MG tablet Take 800 mg by mouth every 6 (six) hours as needed.    . Lancets (ONETOUCH ULTRASOFT) lancets Use as instructed 100 each 12  . levothyroxine (SYNTHROID) 137 MCG tablet TAKE 1 TABLET BY MOUTH ON AN EMPTY STOMACH EVERY  MORNING DAILY 90 DAYS 90 tablet 3  . lidocaine-prilocaine (EMLA) cream Apply to affected area once 30 g 3  . LORazepam (ATIVAN) 1 MG tablet TAKE 1/2  TO 1 TABLET BY MOUTH AT BEDTIME AS NEEDED 30 tablet 0  . losartan-hydrochlorothiazide (HYZAAR) 100-25 MG tablet TAKE 1 TABLET BY MOUTH ONCE DAILY 90 tablet 1  . Multiple Vitamin (MULTIVITAMIN) tablet Take 1 tablet by mouth daily.    Marland Kitchen omega-3 acid ethyl esters (LOVAZA) 1 g capsule Take 1 g by mouth 2 (two) times daily.     . potassium chloride SA (K-DUR,KLOR-CON) 20 MEQ tablet Take 40 mEq by mouth daily.     . pregabalin (LYRICA) 100 MG capsule TAKE 1 CAPSULE BY MOUTH 3 TIMES A DAY 90 capsule 0  . prochlorperazine (COMPAZINE) 10 MG tablet Take 1 tablet (10 mg total) by mouth every 6 (six) hours as needed (Nausea or vomiting). 30 tablet 1  . rosuvastatin (CRESTOR) 5 MG tablet TAKE 1 TABLET BY MOUTH ONCE DAILY 30 tablet 7  . sertraline (ZOLOFT) 100 MG tablet TAKE 1 TABLET BY MOUTH ONCE A DAY 30 tablet 12  . traMADol (ULTRAM) 50 MG tablet Take 50 mg by mouth every 6 (six) hours as needed.    . Vitamin D, Cholecalciferol, 1000 units CAPS Take 1 tablet by mouth daily. (Patient taking differently: Take 1,000 Units by mouth daily.) 90 capsule 4   No current facility-administered medications for this visit.   Facility-Administered Medications Ordered in Other Visits  Medication Dose Route Frequency Provider Last Rate Last Admin  . sodium chloride flush (NS) 0.9 % injection 10 mL  10 mL Intracatheter PRN Elohim Brune, Virgie Dad, MD   10 mL at 09/17/20 1256    OBJECTIVE: African-American woman who appears stated age  6:   09/17/20 0826  BP: (!) 150/69  Pulse: 78  Resp: 18  Temp: (!) 97.3 F (36.3 C)  SpO2: 97%     Body mass index is 45.35 kg/m.    ECOG FS:1 - Symptomatic but completely ambulatory  Filed Weights   09/17/20 0826  Weight: 281 lb (127.5 kg)     Sclerae unicteric, EOMs intact Wearing a mask No cervical or supraclavicular  adenopathy Lungs no rales or rhonchi Heart regular rate and rhythm Abd soft, obese, nontender, positive bowel sounds MSK no focal spinal tenderness, chronic grade 2 right upper extremity lymphedema, with compression sleeve in place Neuro: nonfocal, well oriented, appropriate affect Breasts: Status post bilateral mastectomies.  No evidence of chest wall recurrence.  Both axillae are benign.   LAB RESULTS:  CMP     Component Value Date/Time   NA 140 09/17/2020 0917   NA 140 07/08/2016 0948   K 4.0 09/17/2020 0917   K 4.1 07/08/2016 0948   CL 103 09/17/2020 0917   CO2 28 09/17/2020 0917   CO2 24 07/08/2016 0948   GLUCOSE 95 09/17/2020 0917   GLUCOSE 89 07/08/2016 0948   BUN 15 09/17/2020 0917   BUN 11.9 07/08/2016 0948   CREATININE 0.92 09/17/2020 0917   CREATININE 0.84 08/22/2020 1050   CREATININE 0.9 07/08/2016 0948   CALCIUM 9.2 09/17/2020 0917   CALCIUM 10.0 07/08/2016 0948   PROT 7.1 09/17/2020 0917   PROT 7.6 07/08/2016 0948   ALBUMIN 3.3 (L) 09/17/2020 0917   ALBUMIN 3.6 07/08/2016 0948   AST 17 09/17/2020 0917   AST 16 08/22/2020 1050   AST 22 07/08/2016 0948   ALT 14 09/17/2020 0917   ALT 14 08/22/2020 1050   ALT 27 07/08/2016 0948   ALKPHOS 140 (H) 09/17/2020 0917   ALKPHOS 115 07/08/2016 0948   BILITOT 0.3 09/17/2020 0917   BILITOT 0.3  08/22/2020 1050   BILITOT 0.35 07/08/2016 0948   GFRNONAA >60 09/17/2020 0917   GFRNONAA >60 08/22/2020 1050   GFRAA >60 01/30/2020 0814   GFRAA >60 05/16/2019 0905    INo results found for: SPEP, UPEP  Lab Results  Component Value Date   WBC 4.3 09/17/2020   NEUTROABS 3.1 09/17/2020   HGB 11.2 (L) 09/17/2020   HCT 34.8 (L) 09/17/2020   MCV 94.3 09/17/2020   PLT 275 09/17/2020      Chemistry      Component Value Date/Time   NA 140 09/17/2020 0917   NA 140 07/08/2016 0948   K 4.0 09/17/2020 0917   K 4.1 07/08/2016 0948   CL 103 09/17/2020 0917   CO2 28 09/17/2020 0917   CO2 24 07/08/2016 0948   BUN 15  09/17/2020 0917   BUN 11.9 07/08/2016 0948   CREATININE 0.92 09/17/2020 0917   CREATININE 0.84 08/22/2020 1050   CREATININE 0.9 07/08/2016 0948      Component Value Date/Time   CALCIUM 9.2 09/17/2020 0917   CALCIUM 10.0 07/08/2016 0948   ALKPHOS 140 (H) 09/17/2020 0917   ALKPHOS 115 07/08/2016 0948   AST 17 09/17/2020 0917   AST 16 08/22/2020 1050   AST 22 07/08/2016 0948   ALT 14 09/17/2020 0917   ALT 14 08/22/2020 1050   ALT 27 07/08/2016 0948   BILITOT 0.3 09/17/2020 0917   BILITOT 0.3 08/22/2020 1050   BILITOT 0.35 07/08/2016 0948       No results found for: LABCA2  No components found for: LABCA125  No results for input(s): INR in the last 168 hours.  Urinalysis No results found for: COLORURINE, APPEARANCEUR, LABSPEC, PHURINE, GLUCOSEU, HGBUR, BILIRUBINUR, KETONESUR, PROTEINUR, UROBILINOGEN, NITRITE, LEUKOCYTESUR   STUDIES: CT Chest W Contrast  Result Date: 09/04/2020 CLINICAL DATA:  Breast cancer.  Restaging. EXAM: CT CHEST WITH CONTRAST TECHNIQUE: Multidetector CT imaging of the chest was performed during intravenous contrast administration. CONTRAST:  65mL OMNIPAQUE IOHEXOL 300 MG/ML  SOLN COMPARISON:  08/01/2019 FINDINGS: Cardiovascular: Heart size upper normal. No thoracic aortic aneurysm. Mediastinum/Nodes: New subcarinal lymphadenopathy with 1.7 cm short axis lymph node visible on 74/3. New lymphadenopathy in the right hilum measures 12 mm short axis on 78/3. No axillary lymphadenopathy. Lungs/Pleura: Stable right apical scarring. 9 mm left perifissural nodule identified previously is stable. Multiple new nodules are identified in the lungs bilaterally. 4 mm right upper lobe nodule on 56/6 is new in the interval. 5 mm right parahilar nodule on 78/6 is new. New 5 mm right lower lobe nodule on 104/6, 4 mm left upper lobe nodule on 46/6, and 4 mm left upper lobe nodule on 42/6. There is a new area of soft tissue pleural thickening in the left hemithorax laterally, best  seen on image 79/3. Upper Abdomen: Multiple low-density liver lesions are compatible with metastases better characterized on abdominal MRI of 08/23/2020 Musculoskeletal: Similar appearance of mixed density bone lesions. Index T10 lesion measured previously at 13 mm is 14 mm today on image 110/3. IMPRESSION: 1. New mediastinal and right hilar lymphadenopathy with multiple new tiny bilateral pulmonary nodules. Imaging features are compatible with metastatic disease. 2. Interval development of soft tissue pleural thickening in the left hemithorax laterally, suspicious for metastatic involvement. Close attention on follow-up recommended. 3. Similar appearance of mixed density bone lesions. 4. Multiple hepatic lesions consistent with metastatic disease and better characterized on recent abdominal MRI. 5. Aortic Atherosclerosis (ICD10-I70.0). Electronically Signed   By: Verda Cumins.D.  On: 09/04/2020 10:57   MR Abdomen W Wo Contrast  Result Date: 08/26/2020 CLINICAL DATA:  Metastatic breast cancer to the liver restaging EXAM: MRI ABDOMEN WITHOUT AND WITH CONTRAST TECHNIQUE: Multiplanar multisequence MR imaging of the abdomen was performed both before and after the administration of intravenous contrast. CONTRAST:  90mL GADAVIST GADOBUTROL 1 MMOL/ML IV SOLN COMPARISON:  05/15/2020 FINDINGS: Lower chest: On coronal images, I am suspicious for potential right eccentric subcarinal adenopathy for example on image 25 of series 3 or possible lymph nodes in this vicinity have a short axis diameter of about 1.8 cm. Hepatobiliary: Compared to 05/15/2020, there are new and enlarging scattered hepatic metastatic lesions compatible with progressive metastatic disease. The index lesion in the caudate lobe measures 2.8 by 2.7 cm on image 14 series 4, previously 1.8 by 1.7 cm. A new subcapsular metastatic lesion in the right hepatic lobe measures 1.2 by 1.0 cm on image 22 of series 4. All told there are about 20 metastatic  lesions in the liver visible currently. The gallbladder appears unremarkable. No biliary dilatation. Mild background hepatic steatosis. Pancreas:  Unremarkable Spleen:  Unremarkable Adrenals/Urinary Tract:  Unremarkable Stomach/Bowel: Unremarkable Vascular/Lymphatic:  Unremarkable Other: Abnormal hazy stranding in the central mesentery similar to prior exams, pattern tending to favor sclerosing mesenteritis. Musculoskeletal: Mild progression of osseous metastatic disease compared to 05/15/2020. For example, at the L1 level on image 48 of series 31, 3 lesions are not clearly visible whereas previously only a single lesion was readily visible. IMPRESSION: 1. Progressive worsening of hepatobiliary metastatic disease with new and enlarging lesions noted, about 20 scattered metastatic lesions visible in the liver currently. 2. Mild progression of osseous metastatic disease with some new lesions observed. 3. Chronic hazy stranding in the central mesentery favoring chronic sclerosing mesenteritis. 4. Based on the coronal images, I am suspicious for the possibility of subcarinal adenopathy eccentric to the right. Electronically Signed   By: Van Clines M.D.   On: 08/26/2020 08:46   IR IMAGING GUIDED PORT INSERTION  Result Date: 09/16/2020 INDICATION: History of recurrent metastatic breast cancer. Please perform image guided placement of port a catheter for durable intravenous access for chemotherapy administration. EXAM: IMPLANTED PORT A CATH PLACEMENT WITH ULTRASOUND AND FLUOROSCOPIC GUIDANCE COMPARISON:  Chest CT-09/03/2020 MEDICATIONS: Zofran 4 mg IV ANESTHESIA/SEDATION: Moderate (conscious) sedation was employed during this procedure. A total of Versed 2 mg and Fentanyl 100 mcg was administered intravenously. Moderate Sedation Time: 23 minutes. The patient's level of consciousness and vital signs were monitored continuously by radiology nursing throughout the procedure under my direct supervision. CONTRAST:   None FLUOROSCOPY TIME:  42 seconds (22 mGy) COMPLICATIONS: None immediate. PROCEDURE: The procedure, risks, benefits, and alternatives were explained to the patient. Questions regarding the procedure were encouraged and answered. The patient understands and consents to the procedure. The right neck and chest were prepped with chlorhexidine in a sterile fashion, and a sterile drape was applied covering the operative field. Maximum barrier sterile technique with sterile gowns and gloves were used for the procedure. A timeout was performed prior to the initiation of the procedure. Local anesthesia was provided with 1% lidocaine with epinephrine. After creating a small venotomy incision, a micropuncture kit was utilized to access the internal jugular vein. Real-time ultrasound guidance was utilized for vascular access including the acquisition of a permanent ultrasound image documenting patency of the accessed vessel. The microwire was utilized to measure appropriate catheter length. A subcutaneous port pocket was then created along the upper chest wall utilizing  a combination of sharp and blunt dissection. The pocket was irrigated with sterile saline. A single lumen "Slim" sized power injectable port was chosen for placement. The 8 Fr catheter was tunneled from the port pocket site to the venotomy incision. The port was placed in the pocket. The external catheter was trimmed to appropriate length. At the venotomy, an 8 Fr peel-away sheath was placed over a guidewire under fluoroscopic guidance. The catheter was then placed through the sheath and the sheath was removed. Final catheter positioning was confirmed and documented with a fluoroscopic spot radiograph. The port was accessed with a Huber needle, aspirated and flushed with heparinized saline. The venotomy site was closed with an interrupted 4-0 Vicryl suture. The port pocket incision was closed with interrupted 2-0 Vicryl suture. The skin was opposed with a  running subcuticular 4-0 Vicryl suture. Dermabond and Steri-strips were applied to both incisions. Dressings were applied. The patient tolerated the procedure well without immediate post procedural complication. FINDINGS: After catheter placement, the tip lies within the superior cavoatrial junction. The catheter aspirates and flushes normally and is ready for immediate use. IMPRESSION: Successful placement of a right internal jugular approach power injectable Port-A-Cath. The catheter is ready for immediate use. Electronically Signed   By: Sandi Mariscal M.D.   On: 09/16/2020 14:52     ELIGIBLE FOR AVAILABLE RESEARCH PROTOCOL: PALLAS, but inelegible because of Mirena IUD   ASSESSMENT: 55 y.o. [place] woman status post right breast upper inner quadrant biopsy 10/15/2015 for a clinical T2-T3 N0, stage II invasive ductal carcinoma, estrogen and progesterone receptor positive, HER-2 nonamplified, with an Mib-1 of 70%  (1) status post right lumpectomy and sentinel lymph node sampling with oncoplastic breast reduction 11/28/2015 for a pT2 pN1, stage IIB invasive ductal carcinoma, grade 1, with negative margins (1/1 sentinel node positive)  (a) reclassified as stage IIA in the 2018 prognostic revision   (2) left reduction mammoplasty 11/28/2015 unexpectedly found a pT1c pNX invasive ductal carcinoma, grade 1, estrogen and progesterone receptor positive, HER-2 not amplified, with an MIB-1 of 3%  (3) Mammaprint from the right sided breast cancer was "low risk", predicting a 98% chance of no disease recurrence within 5 years with anti-estrogens as the only systemic therapy. It also predicts minimal to no benefit from chemotherapy  (4) status post bilateral mastectomies with bilateral sentinel lymph node sampling 12/26/2015, showing  (a) on the right, no residual carcinoma (0/3 nodes involved)  (b) on the left, focal ductal carcinoma in situ, 0.2 cm, with negative margins; (0/4 nodes involved)  (5)  adjuvant radiation 02/11/16 - 03/27/16    1) Right chest wall: 50.4 Gy in 28 fractions               2) Right chest wall boost: 10 Gy in 5 fractions   (6) started on tamoxifen neoadjuvantly 10/23/2015 to allow for expected delays in definitive local treatment  (a) Mirena IUD in place  (b) tamoxifen discontinued 08/13/2017, with progression  (7) since she had 4 lymph nodes removed from each axilla but also receive radiation on the right, lab draws should preferentially be obtained from the left arm   METASTATIC DISEASE: April 2019 (8) CT scan of the abdomen and pelvis 08/04/2017, MRI of the liver and total spine MRI 08/11/2017 showed liver and bone metastases  (a) liver biopsy 09/07/2017 confirms estrogen receptor positive, progesterone receptor negative, HER-2 negative metastatic breast cancer  (b) chest CT scan 08/13/2017 shows multiple bilateral pulmonary nodules.  (c) CT of the  brain with and without contrast 08/26/2017 shows no evidence of metastatic disease to the brain  (d) CA 27-29 is informative  (9) fulvestrant started 08/17/2017  (a) palbociclib started 08/31/2017  (b) fulvestrant and palbociclib discontinued 01/04/2018 with evidence of progression in the liver  (10) denosumab/Xgeva started 09/14/2017  (a) held after 03/01/2018 dose because concern re. osteonecrosis  (b) resumed 06/09/2018  (c) 02/21/2019 dose held due to oral surgery, resumed 03/21/2019  (11) anastrozole started 01/04/2018  (a) everolimus started 01/29/2018  (b) liver MRI and bone scan December 2019 show stable disease  (c) chest CT scan on 08/02/2018 shows lung lesions resolved, bone lesions stable  (d) bone scan 09/13/2018 essentially stable  (e) liver MRI 12/08/2018 shows improvement in hepatic metastsases  (f) liver MRI and bone scan 04/13/2019 showed continuing improvement in the liver, stable bone lesions  (g) chest CT 08/01/2019 stable, no evidence of active disease ows  (12) 01/28/18 prophylactic  left femur intramedullary nail surgery for impending left femur pathologic fracture  (a) palliative radiation with Dr. [name] to the left femur and left ilium from 02/16/2018 to 03/01/2018  (13) [facility] report on liver biopsy from April 2019 shows a negative PDL 1, stable MSI and proficient mismatch repair status.  The tumor is positive for the androgen receptor, and for PTEN for PIK3 CA mutations.  HER-2/neu was read as equivocal (it was negative by FISH on the pathology report).  (14) Progression noted in liver MRI on 01/11/2020; fulvestrant started on 01/30/2020  (a) alpelisib started 01/30/2020 at 300 mg daily with Metformin 500 mg daily  (b) alpelisib held on 02/07/2020 with very high blood sugars.  Metformin increased to 1 g daily  (c) alpelisib resumed at 150 mg daily on 02/13/2020  (d) liver MRI 05/06/2020 shows evidence of disease progression  (e) fulvestrant and alpelisib discontinued January 2022  (15) capecitabine started 05/22/2020 at 1.5 g twice daily, 14/7  (a) abdominal MRI 08/23/2020 shows worsening liver disease  (b) chest CT scan 09/04/2020 shows new lung metastases  (c) capecitabine discontinued April 2022  (16) paclitaxel started 09/10/2020, repeated days 1, 8, and 15 of each 28-day cycle   PLAN: Melinna tolerated her first cycle of paclitaxel well.  She will receive the second dose today and the third next week.  She had been scheduled the wrong week on May 24 we are canceling that.  She will then begin cycle #2 on 10/08/2020.  I do not think she will have this severe bone pain she had the first time with subsequent treatment.  If she does she knows to take Tylenol together with Aleve which I think is more effective than doubling up on the ibuprofen.  She has had no nausea and no significant problems with constipation or diarrhea so no changes there.  She does not have mouth sores but she does have soreness in her mouth so I am starting her on Valtrex and also refilling her  Peridex.  She notes she will be losing her hair in the next couple of weeks.  She already has several wigs on hand.  Otherwise she will see me again at the beginning of each cycle and she will be restaged at the completion of 4 cycles.  Total encounter time 35 minutes.Sarajane Jews C. Yunis Voorheis, MD 09/17/20 2:18 PM [facility] Oncology [facility] [place], [place] [address] Tel. [phone]    Fax. [phone]   I, Wilburn Mylar, am acting as scribe for Dr.  Virgie Dad. Debbie [name].  I, Lurline Del MD, have reviewed the above documentation for accuracy and completeness, and I agree with the above.   *Total Encounter Time as defined by the Centers for Medicare and Medicaid Services includes, in addition to the face-to-face time of a patient visit (documented in the note above) non-face-to-face time: obtaining and reviewing outside history, ordering and reviewing medications, tests or procedures, care coordination (communications with other health care professionals or caregivers) and documentation in the medical record.

## 2020-09-16 NOTE — Discharge Instructions (Signed)
Please call Interventional Radiology clinic [phone] with any questions or concerns.  You may remove your dressing and shower tomorrow.  DO NOT use EMLA cream on your port site for 2 weeks as this cream will remove surgical glue on your incision.  Implanted Port Insertion, Care After This sheet gives you information about how to care for yourself after your procedure. Your health care provider may also give you more specific instructions. If you have problems or questions, contact your health care provider. What can I expect after the procedure? After the procedure, it is common to have:  Discomfort at the port insertion site.  Bruising on the skin over the port. This should improve over 3-4 days. Follow these instructions at home: Port care  After your port is placed, you will get a manufacturer's information card. The card has information about your port. Keep this card with you at all times.  Take care of the port as told by your health care provider. Ask your health care provider if you or a family member can get training for taking care of the port at home. A home health care nurse may also take care of the port.  Make sure to remember what type of port you have. Incision care 1. Follow instructions from your health care provider about how to take care of your port insertion site. Make sure you: ? Wash your hands with soap and water before and after you change your bandage (dressing). If soap and water are not available, use hand sanitizer. ? Change your dressing as told by your health care provider. ? Leave stitches (sutures), skin glue, or adhesive strips in place. These skin closures may need to stay in place for 2 weeks or longer. If adhesive strip edges start to loosen and curl up, you may trim the loose edges. Do not remove adhesive strips completely unless your health care provider tells you to do that. 2. Check your port insertion site every day for signs of infection.  Check for: ? Redness, swelling, or pain. ? Fluid or blood. ? Warmth. ? Pus or a bad smell.        Activity  Return to your normal activities as told by your health care provider. Ask your health care provider what activities are safe for you.  Do not lift anything that is heavier than 10 lb (4.5 kg), or the limit that you are told, until your health care provider says that it is safe. General instructions  Take over-the-counter and prescription medicines only as told by your health care provider.  Do not take baths, swim, or use a hot tub until your health care provider approves. Ask your health care provider if you may take showers. You may only be allowed to take sponge baths.  Do not drive for 24 hours if you were given a sedative during your procedure.  Wear a medical alert bracelet in case of an emergency. This will tell any health care providers that you have a port.  Keep all follow-up visits as told by your health care provider. This is important. Contact a health care provider if:  You cannot flush your port with saline as directed, or you cannot draw blood from the port.  You have a fever or chills.  You have redness, swelling, or pain around your port insertion site.  You have fluid or blood coming from your port insertion site.  Your port insertion site feels warm to the touch.  You have pus   or a bad smell coming from the port insertion site. Get help right away if:  You have chest pain or shortness of breath.  You have bleeding from your port that you cannot control. Summary  Take care of the port as told by your health care provider. Keep the manufacturer's information card with you at all times.  Change your dressing as told by your health care provider.  Contact a health care provider if you have a fever or chills or if you have redness, swelling, or pain around your port insertion site.  Keep all follow-up visits as told by your health care  provider. This information is not intended to replace advice given to you by your health care provider. Make sure you discuss any questions you have with your health care provider. Document Revised: 11/23/2017 Document Reviewed: 11/23/2017 Elsevier Patient Education  2021 Elsevier Inc.   Moderate Conscious Sedation, Adult, Care After This sheet gives you information about how to care for yourself after your procedure. Your health care provider may also give you more specific instructions. If you have problems or questions, contact your health care provider. What can I expect after the procedure? After the procedure, it is common to have:  Sleepiness for several hours.  Impaired judgment for several hours.  Difficulty with balance.  Vomiting if you eat too soon. Follow these instructions at home: For the time period you were told by your health care provider:  Rest.  Do not participate in activities where you could fall or become injured.  Do not drive or use machinery.  Do not drink alcohol.  Do not take sleeping pills or medicines that cause drowsiness.  Do not make important decisions or sign legal documents.  Do not take care of children on your own.        Eating and drinking 3. Follow the diet recommended by your health care provider. 4. Drink enough fluid to keep your urine pale yellow. 5. If you vomit: ? Drink water, juice, or soup when you can drink without vomiting. ? Make sure you have little or no nausea before eating solid foods.    General instructions  Take over-the-counter and prescription medicines only as told by your health care provider.  Have a responsible adult stay with you for the time you are told. It is important to have someone help care for you until you are awake and alert.  Do not smoke.  Keep all follow-up visits as told by your health care provider. This is important. Contact a health care provider if:  You are still sleepy or  having trouble with balance after 24 hours.  You feel light-headed.  You keep feeling nauseous or you keep vomiting.  You develop a rash.  You have a fever.  You have redness or swelling around the IV site. Get help right away if:  You have trouble breathing.  You have new-onset confusion at home. Summary  After the procedure, it is common to feel sleepy, have impaired judgment, or feel nauseous if you eat too soon.  Rest after you get home. Know the things you should not do after the procedure.  Follow the diet recommended by your health care provider and drink enough fluid to keep your urine pale yellow.  Get help right away if you have trouble breathing or new-onset confusion at home. This information is not intended to replace advice given to you by your health care provider. Make sure you discuss any questions you   have with your health care provider. Document Revised: 08/25/2019 Document Reviewed: 03/23/2019 Elsevier Patient Education  2021 [employer].

## 2020-09-16 NOTE — H&P (Signed)
Referring Physician(s): Chauncey Cruel  Supervising Physician: Sandi Mariscal  Patient Status:  [facility] OP  Chief Complaint: "I'm getting a port a cath"   Subjective: Patient familiar to IR service from liver lesion biopsy in 2019.  She has a history of stage IV right breast cancer with prior bilateral mastectomies in 2017 along with oral chemo and radiation therapy.  She has evidence of disease progression on recent imaging.  She underwent her first infusion of chemo last Tuesday.  She has poor venous access and presents today for Port-A-Cath placement to assist with additional treatment.  She currently denies fever, headache, chest pain, dyspnea, cough, abdominal pain, nausea, vomiting or bleeding.  She does have some chronic back pain.  Past Medical History:  Diagnosis Date  . Anxiety   . Arthritis   . Bone metastasis ([place])   . Breast cancer of upper-inner quadrant of right female breast [facility]) oncologist-  dr [name]--- 04/ 2019 ER/PR+ Stage IV w/ metastatic disease to liver, total spine, lung, bone   dx 06/ 2017  right breast invasive ductal carcinoma, Stage IIB, ER/PR + (pT2 pN1), grade 1-- s/p  right breast lumpecotmy w/ sln bx's and bilateral breast reduction 11-28-2015/  left breast with reduction , dx invasive ductal ca, Grade 1 (pT1cpNX) ER/PR positive/  adjuvant radiation completed 03-27-2016 to right chest wall, 2018  reclassifiec Stage IIA  . Cancer, metastatic to liver ([place])   . Chronic back pain   . Constipation   . Depression   . Family history of adverse reaction to anesthesia     mother has Copd was kept on vent   . Headache   . Heart murmur   . History of hyperthyroidism    s/p  RAI  . History of radiation therapy 02/11/16- 03/27/16   Right Chest Wall 50.4 Gy in 28 fractions, Right Chest Wall Boost 10 Gy in 5 fractions.   . Hyperlipidemia   . Hypertension   . Hypothyroidism, postradioiodine therapy    endocrinoloigst-  dr balan--  2004  s/p  RAI  .  Menorrhagia 2011  . Multiple pulmonary nodules    secondary  metastatic  . PONV (postoperative nausea and vomiting)    patch helps   . SUI (stress urinary incontinence, female)   . Wears contact lenses   . Wears dentures    full upper and lower partial   Past Surgical History:  Procedure Laterality Date  . BREAST LUMPECTOMY WITH NEEDLE LOCALIZATION AND AXILLARY SENTINEL LYMPH NODE BX Right 11/28/2015   Procedure: RIGHT BREAST LUMPECTOMY WITH NEEDLE LOCALIZATION AND AXILLARY SENTINEL LYMPH NODE BX;  Surgeon: Autumn Messing III, MD;  Location: [place];  Service: General;  Laterality: Right;  . BREAST REDUCTION SURGERY Bilateral 11/28/2015   Procedure: MAMMARY REDUCTION  (BREAST) BILATERAL;  Surgeon: Crissie Reese, MD;  Location: [place];  Service: Plastics;  Laterality: Bilateral;  . CESAREAN SECTION  01/12/1993  . COLONOSCOPY    . Dental surgeries    . FEMUR IM NAIL Left 01/28/2018   Procedure: INTRAMEDULLARY (IM) NAIL FEMORAL;  Surgeon: Nicholes Stairs, MD;  Location: [place];  Service: Orthopedics;  Laterality: Left;  . HYSTEROSCOPY N/A 09/02/2017   Procedure: HYSTEROSCOPY  to remove IUD;  Surgeon: Eldred Manges, MD;  Location: [facility];  Service: Gynecology;  Laterality: N/A;  . Liver biospy    . MASTECTOMY    . MASTECTOMY W/ SENTINEL NODE BIOPSY Bilateral 12/26/2015   Procedure: BILATERAL MASTECTOMY WITH LEFT  SENTINEL LYMPH NODE BIOPSY;  Surgeon: Autumn Messing III, MD;  Location: [place];  Service: General;  Laterality: Bilateral;  . Removal of IUD    . TUBAL LIGATION Bilateral 1995     Allergies: Ace inhibitors, Atorvastatin, and Simvastatin  Medications: Prior to Admission medications   Medication Sig Start Date End Date Taking? Authorizing Provider  Calcium-Magnesium 250-125 MG TABS Take 1 tablet by mouth daily. 04/09/16  Yes [name], Virgie Dad, MD  cetirizine (ZYRTEC) 10 MG tablet Take 10 mg by mouth daily as needed for allergies.    Yes [provider]   chlorhexidine (PERIDEX) 0.12 % solution  12/29/18  Yes [provider]  Ferrous Sulfate (IRON) 325 (65 Fe) MG TABS Take 1 tablet (325 mg total) by mouth daily. 05/19/18  Yes [name], Virgie Dad, MD  fluconazole (DIFLUCAN) 100 MG tablet TAKE 2 TABLETS BY MOUTH FOR THE 1ST DOSE THEN 1 TABLET DAILY 02/09/20 02/08/21 Yes [name], Virgie Dad, MD  furosemide (LASIX) 20 MG tablet Take 20 mg by mouth daily as needed. 09/21/19  Yes [provider]  ibuprofen (ADVIL) 800 MG tablet Take 800 mg by mouth every 6 (six) hours as needed. 10/20/19  Yes [provider]  levothyroxine (SYNTHROID) 137 MCG tablet TAKE 1 TABLET BY MOUTH ON AN EMPTY STOMACH EVERY MORNING DAILY 90 DAYS 07/31/20 07/31/21 Yes Kelton Pillar, MD  LORazepam (ATIVAN) 1 MG tablet TAKE 1/2 TO 1 TABLET BY MOUTH AT BEDTIME AS NEEDED 07/17/20 01/13/21 Yes Kelton Pillar, MD  losartan-hydrochlorothiazide [facility]) 100-25 MG tablet TAKE 1 TABLET BY MOUTH ONCE DAILY 07/31/20 07/31/21 Yes Kelton Pillar, MD  Multiple Vitamin (MULTIVITAMIN) tablet Take 1 tablet by mouth daily.   Yes [provider]  omega-3 acid ethyl esters (LOVAZA) 1 g capsule Take 1 g by mouth 2 (two) times daily.    Yes [provider]  potassium chloride SA (K-DUR,KLOR-CON) 20 MEQ tablet Take 40 mEq by mouth daily.    Yes [provider]  pregabalin (LYRICA) 100 MG capsule TAKE 1 CAPSULE BY MOUTH 3 TIMES A DAY 08/21/20  Yes [name], Virgie Dad, MD  prochlorperazine (COMPAZINE) 10 MG tablet Take 1 tablet (10 mg total) by mouth every 6 (six) hours as needed (Nausea or vomiting). 08/26/20  Yes [name], Virgie Dad, MD  rosuvastatin (CRESTOR) 5 MG tablet TAKE 1 TABLET BY MOUTH ONCE DAILY 01/08/20 01/07/21 Yes Kelton Pillar, MD  sertraline (ZOLOFT) 100 MG tablet TAKE 1 TABLET BY MOUTH ONCE A DAY 08/05/20 08/05/21 Yes Kelton Pillar, MD  traMADol (ULTRAM) 50 MG tablet Take 50 mg by mouth every 6 (six) hours as needed. 10/23/19  Yes [provider]   Vitamin D, Cholecalciferol, 1000 units CAPS Take 1 tablet by mouth daily. Patient taking differently: Take 1,000 Units by mouth daily. 04/09/16  Yes [name], Virgie Dad, MD  blood glucose meter kit and supplies KIT Dispense based on patient and insurance preference. Use up to four times daily as directed. (FOR ICD-9 250.00, 250.01). 01/24/20   Gardenia Phlegm, NP  glucose blood test strip USE AS DIRECTED TO TEST UP TO FOUR TIMES DAILY Patient taking differently: USE AS DIRECTED TO TEST UP TO FOUR TIMES DAILY 03/13/20 03/13/21  Gardenia Phlegm, NP  Lancets [facility] ULTRASOFT) lancets Use as instructed 01/24/20   Gardenia Phlegm, NP  lidocaine-prilocaine (EMLA) cream Apply to affected area once 08/26/20   [name], Virgie Dad, MD  metFORMIN (GLUCOPHAGE XR) 500 MG 24 hr tablet Take 2 tablets (1,000 mg total) by  mouth in the morning and at bedtime. 02/29/20 05/21/20  [name], Virgie Dad, MD     Vital Signs: Vitals:   09/16/20 1233  BP: (!) 150/70  Pulse: 87  Resp: 18  Temp: 98.4 F (36.9 C)  SpO2: 98%    LMP 12/19/2015   Physical Exam awake, alert.  Chest clear to auscultation bilaterally.  Heart with regular rate and rhythm.  Abdomen obese, soft, positive bowel sounds, nontender.  Some pretibial edema noted bilaterally.  Imaging: No results found.  Labs:  CBC: Recent Labs    06/20/20 0801 07/18/20 1159 08/22/20 1050 09/10/20 0717  WBC 5.3 5.4 5.5 5.0  HGB 12.1 12.1 12.2 11.6*  HCT 38.1 37.2 36.9 36.1  PLT 255 271 243 224    COAGS: No results for input(s): INR, APTT in the last 8760 hours.  BMP: Recent Labs    11/01/19 1329 12/12/19 0917 01/24/20 0840 01/30/20 0814 02/13/20 0820 06/20/20 0801 07/18/20 1159 08/22/20 1050 09/10/20 0717  NA 143 141 141 142   < > 140 138 142 141  K 3.6 3.2* 3.5 3.2*   < > 4.2 3.7 4.0 3.4*  CL 105 106 104 105   < > 105 104 103 102  CO2 '27 29 29 27   ' < > '28 27 28 28  ' GLUCOSE 104* 101* 109* 103*   < > 99 89 94  95  BUN '11 12 14 8   ' < > '8 11 11 11  ' CALCIUM 9.5 9.8 9.7 9.1   < > 9.4 9.6 9.5 9.2  CREATININE 0.86 0.82 0.81 0.76   < > 0.81 0.88 0.84 0.82  GFRNONAA >60 >60 >60 >60   < > >60 >60 >60 >60  GFRAA >60 >60 >60 >60  --   --   --   --   --    < > = values in this interval not displayed.    LIVER FUNCTION TESTS: Recent Labs    06/20/20 0801 07/18/20 1159 08/22/20 1050 09/10/20 0717  BILITOT 0.4 0.4 0.3 0.3  AST 18 14* 16 20  ALT '15 12 14 17  ' ALKPHOS 145* 137* 162* 162*  PROT 7.4 7.4 7.4 7.4  ALBUMIN 3.4* 3.4* 3.4* 3.3*    Assessment and Plan: Patient familiar to IR service from liver lesion biopsy in 2019.  She has a history of stage IV right breast cancer with prior bilateral mastectomies in 2017 along with oral chemo and radiation therapy.  She has evidence of disease progression on recent imaging.  She underwent her first infusion of chemo last Tuesday.  She has poor venous access and presents today for Port-A-Cath placement to assist with additional treatment. Risks and benefits of image guided port-a-catheter placement was discussed with the patient including, but not limited to bleeding, infection, pneumothorax, or fibrin sheath development and need for additional procedures.  All of the patient's questions were answered, patient is agreeable to proceed. Consent signed and in chart.     Electronically Signed: D. Rowe Robert, PA-C 09/16/2020, 12:28 PM   I spent a total of 25 minutes at the the patient's bedside AND on the patient's hospital floor or unit, greater than 50% of which was counseling/coordinating care for Port-A-Cath placement

## 2020-09-17 ENCOUNTER — Ambulatory Visit: Payer: 59

## 2020-09-17 ENCOUNTER — Inpatient Hospital Stay (HOSPITAL_BASED_OUTPATIENT_CLINIC_OR_DEPARTMENT_OTHER): Payer: 59 | Admitting: Oncology

## 2020-09-17 ENCOUNTER — Inpatient Hospital Stay: Payer: 59

## 2020-09-17 ENCOUNTER — Other Ambulatory Visit (HOSPITAL_COMMUNITY): Payer: Self-pay

## 2020-09-17 ENCOUNTER — Other Ambulatory Visit: Payer: 59

## 2020-09-17 VITALS — BP 150/69 | HR 78 | Temp 97.3°F | Resp 18 | Ht 66.0 in | Wt 281.0 lb

## 2020-09-17 DIAGNOSIS — C50211 Malignant neoplasm of upper-inner quadrant of right female breast: Secondary | ICD-10-CM

## 2020-09-17 DIAGNOSIS — Z79899 Other long term (current) drug therapy: Secondary | ICD-10-CM | POA: Diagnosis not present

## 2020-09-17 DIAGNOSIS — C50112 Malignant neoplasm of central portion of left female breast: Secondary | ICD-10-CM | POA: Diagnosis not present

## 2020-09-17 DIAGNOSIS — Z170 Estrogen receptor positive status [ER+]: Secondary | ICD-10-CM

## 2020-09-17 DIAGNOSIS — Z923 Personal history of irradiation: Secondary | ICD-10-CM | POA: Diagnosis not present

## 2020-09-17 DIAGNOSIS — Z5111 Encounter for antineoplastic chemotherapy: Secondary | ICD-10-CM | POA: Diagnosis not present

## 2020-09-17 DIAGNOSIS — C787 Secondary malignant neoplasm of liver and intrahepatic bile duct: Secondary | ICD-10-CM

## 2020-09-17 DIAGNOSIS — C7951 Secondary malignant neoplasm of bone: Secondary | ICD-10-CM | POA: Diagnosis not present

## 2020-09-17 LAB — COMPREHENSIVE METABOLIC PANEL
ALT: 14 U/L (ref 0–44)
AST: 17 U/L (ref 15–41)
Albumin: 3.3 g/dL — ABNORMAL LOW (ref 3.5–5.0)
Alkaline Phosphatase: 140 U/L — ABNORMAL HIGH (ref 38–126)
Anion gap: 9 (ref 5–15)
BUN: 15 mg/dL (ref 6–20)
CO2: 28 mmol/L (ref 22–32)
Calcium: 9.2 mg/dL (ref 8.9–10.3)
Chloride: 103 mmol/L (ref 98–111)
Creatinine, Ser: 0.92 mg/dL (ref 0.44–1.00)
GFR, Estimated: >60 mL/min (ref >60)
Glucose, Bld: 95 mg/dL (ref 70–99)
Potassium: 4 mmol/L (ref 3.5–5.1)
Sodium: 140 mmol/L (ref 135–145)
Total Bilirubin: 0.3 mg/dL (ref 0.3–1.2)
Total Protein: 7.1 g/dL (ref 6.5–8.1)

## 2020-09-17 LAB — CBC WITH DIFFERENTIAL/PLATELET
Abs Immature Granulocytes: 0.05 10*3/uL (ref 0.00–0.07)
Basophils Absolute: 0 10*3/uL (ref 0.0–0.1)
Basophils Relative: 1 %
Eosinophils Absolute: 0.2 10*3/uL (ref 0.0–0.5)
Eosinophils Relative: 4 %
HCT: 34.8 % — ABNORMAL LOW (ref 36.0–46.0)
Hemoglobin: 11.2 g/dL — ABNORMAL LOW (ref 12.0–15.0)
Immature Granulocytes: 1 %
Lymphocytes Relative: 13 %
Lymphs Abs: 0.6 10*3/uL — ABNORMAL LOW (ref 0.7–4.0)
MCH: 30.4 pg (ref 26.0–34.0)
MCHC: 32.2 g/dL (ref 30.0–36.0)
MCV: 94.3 fL (ref 80.0–100.0)
Monocytes Absolute: 0.4 10*3/uL (ref 0.1–1.0)
Monocytes Relative: 10 %
Neutro Abs: 3.1 10*3/uL (ref 1.7–7.7)
Neutrophils Relative %: 71 %
Platelets: 275 10*3/uL (ref 150–400)
RBC: 3.69 MIL/uL — ABNORMAL LOW (ref 3.87–5.11)
RDW: 16.9 % — ABNORMAL HIGH (ref 11.5–15.5)
WBC: 4.3 10*3/uL (ref 4.0–10.5)
nRBC: 0 % (ref 0.0–0.2)

## 2020-09-17 MED ORDER — SODIUM CHLORIDE 0.9 % IV SOLN
80.0000 mg/m2 | Freq: Once | INTRAVENOUS | Status: AC
  Administered 2020-09-17: 192 mg via INTRAVENOUS
  Filled 2020-09-17: qty 32

## 2020-09-17 MED ORDER — FAMOTIDINE 20 MG IN NS 100 ML IVPB
INTRAVENOUS | Status: AC
  Filled 2020-09-17: qty 100

## 2020-09-17 MED ORDER — DENOSUMAB 120 MG/1.7ML ~~LOC~~ SOLN
120.0000 mg | Freq: Once | SUBCUTANEOUS | Status: AC
  Administered 2020-09-17: 120 mg via SUBCUTANEOUS

## 2020-09-17 MED ORDER — VALACYCLOVIR HCL 1 G PO TABS
1000.0000 mg | ORAL_TABLET | Freq: Every day | ORAL | 6 refills | Status: DC
  Filled 2020-09-17: qty 60, 60d supply, fill #0
  Filled 2020-11-19: qty 90, 90d supply, fill #0
  Filled 2020-11-28 – 2021-09-09 (×2): qty 90, 90d supply, fill #1

## 2020-09-17 MED ORDER — DENOSUMAB 120 MG/1.7ML ~~LOC~~ SOLN
SUBCUTANEOUS | Status: AC
  Filled 2020-09-17: qty 1.7

## 2020-09-17 MED ORDER — SODIUM CHLORIDE 0.9 % IV SOLN
Freq: Once | INTRAVENOUS | Status: AC
  Filled 2020-09-17: qty 250

## 2020-09-17 MED ORDER — FAMOTIDINE 20 MG IN NS 100 ML IVPB
20.0000 mg | Freq: Once | INTRAVENOUS | Status: AC
  Administered 2020-09-17: 20 mg via INTRAVENOUS

## 2020-09-17 MED ORDER — DIPHENHYDRAMINE HCL 50 MG/ML IJ SOLN
INTRAMUSCULAR | Status: AC
  Filled 2020-09-17: qty 1

## 2020-09-17 MED ORDER — SODIUM CHLORIDE 0.9% FLUSH
10.0000 mL | INTRAVENOUS | Status: DC | PRN
  Administered 2020-09-17: 10 mL
  Filled 2020-09-17: qty 10

## 2020-09-17 MED ORDER — HEPARIN SOD (PORK) LOCK FLUSH 100 UNIT/ML IV SOLN
500.0000 [IU] | Freq: Once | INTRAVENOUS | Status: AC | PRN
  Administered 2020-09-17: 500 [IU]
  Filled 2020-09-17: qty 5

## 2020-09-17 MED ORDER — CHLORHEXIDINE GLUCONATE 0.12 % MT SOLN
5.0000 mL | Freq: Two times a day (BID) | OROMUCOSAL | 6 refills | Status: DC | PRN
  Filled 2020-09-17: qty 473, 47d supply, fill #0

## 2020-09-17 MED ORDER — SODIUM CHLORIDE 0.9 % IV SOLN
10.0000 mg | Freq: Once | INTRAVENOUS | Status: AC
  Administered 2020-09-17: 10 mg via INTRAVENOUS
  Filled 2020-09-17: qty 10

## 2020-09-17 MED ORDER — SODIUM CHLORIDE 0.9% FLUSH
10.0000 mL | INTRAVENOUS | Status: DC | PRN
  Administered 2020-09-17: 10 mL via INTRAVENOUS
  Filled 2020-09-17: qty 10

## 2020-09-17 MED ORDER — DIPHENHYDRAMINE HCL 50 MG/ML IJ SOLN
25.0000 mg | Freq: Once | INTRAMUSCULAR | Status: AC
  Administered 2020-09-17: 25 mg via INTRAVENOUS

## 2020-09-17 NOTE — Patient Instructions (Signed)
[facility] MEDICAL ONCOLOGY  Discharge Instructions: °Thank you for choosing [facility] to provide your oncology and hematology care.  ° °If you have a lab appointment with the Cancer Center, please go directly to the Cancer Center and check in at the registration area. °  °Wear comfortable clothing and clothing appropriate for easy access to any Portacath or PICC line.  ° °We strive to give you quality time with your provider. You may need to reschedule your appointment if you arrive late (15 or more minutes).  Arriving late affects you and other patients whose appointments are after yours.  Also, if you miss three or more appointments without notifying the office, you may be dismissed from the clinic at the provider’s discretion.    °  °For prescription refill requests, have your pharmacy contact our office and allow 72 hours for refills to be completed.   ° °Today you received the following chemotherapy and/or immunotherapy agents: Taxol   °  °To help prevent nausea and vomiting after your treatment, we encourage you to take your nausea medication as directed. ° °BELOW ARE SYMPTOMS THAT SHOULD BE REPORTED IMMEDIATELY: °*FEVER GREATER THAN 100.4 F (38 °C) OR HIGHER °*CHILLS OR SWEATING °*NAUSEA AND VOMITING THAT IS NOT CONTROLLED WITH YOUR NAUSEA MEDICATION °*UNUSUAL SHORTNESS OF BREATH °*UNUSUAL BRUISING OR BLEEDING °*URINARY PROBLEMS (pain or burning when urinating, or frequent urination) °*BOWEL PROBLEMS (unusual diarrhea, constipation, pain near the anus) °TENDERNESS IN MOUTH AND THROAT WITH OR WITHOUT PRESENCE OF ULCERS (sore throat, sores in mouth, or a toothache) °UNUSUAL RASH, SWELLING OR PAIN  °UNUSUAL VAGINAL DISCHARGE OR ITCHING  ° °Items with * indicate a potential emergency and should be followed up as soon as possible or go to the Emergency Department if any problems should occur. ° °Please show the CHEMOTHERAPY ALERT CARD or IMMUNOTHERAPY ALERT CARD at check-in to the  Emergency Department and triage nurse. ° °Should you have questions after your visit or need to cancel or reschedule your appointment, please contact [facility] MEDICAL ONCOLOG[facility]  Dept: [phone]  and follow the prompts.  Office hours are 8:00 a.m. to 4:30 p.m. Monday - Friday. Please note that voicemails left after 4:00 p.m. may not be returned until the following business day.  We are closed weekends and major holidays. You have access to a nurse at all times for urgent questions. Please call the main number to the clinic Dept: [phone] and follow the prompts. ° ° °For any non-urgent questions, you may also contact your provider using MyChart. We now offer e-Visits for anyone 18 and older to request care online for non-urgent symptoms. For details visit [email or web address]. °  °Also download the MyChart app! Go to the app store, search "MyChart", open the app, select [facility], and log in with your MyChart username and password. ° °Due to Covid, a mask is required upon entering the hospital/clinic. If you do not have a mask, one will be given to you upon arrival. For doctor visits, patients may have 1 support person aged 18 or older with them. For treatment visits, patients cannot have anyone with them due to current Covid guidelines and our immunocompromised population.  ° °

## 2020-09-17 NOTE — Patient Instructions (Signed)

## 2020-09-18 ENCOUNTER — Telehealth: Payer: Self-pay | Admitting: Oncology

## 2020-09-18 LAB — CANCER ANTIGEN 27.29: CA 27.29: 306.3 U/mL — ABNORMAL HIGH (ref 0.0–38.6)

## 2020-09-18 NOTE — Telephone Encounter (Signed)
Updated MD visit per provider's 5/11 sch msg.

## 2020-09-23 NOTE — Progress Notes (Signed)
[place]  [phone] [phone]    ID: Debbie [name] DOB: 1965-06-20  MR#: [id number]  JWL#:[id number]  Patient Care Team: Debbie Pillar, MD as PCP - General (Family Medicine) Debbie Pi, MD as Consulting Physician (Endocrinology) Debbie Kussmaul, MD as Consulting Physician (General Surgery) Debbie [name], Debbie Dad, MD as Consulting Physician (Oncology) Debbie Gibson, MD as Attending Physician (Radiation Oncology) Debbie Crome, MD as Consulting Physician (Cardiology) Debbie Reese, MD as Consulting Physician (Plastic Surgery) Debbie [name], Debbie Massed, NP as Nurse Practitioner (Hematology and Oncology) Debbie [name], DMD (Dentistry) OTHER MD:   CHIEF COMPLAINT: Estrogen receptor positive stage IV  breast cancer (s/p bilateral mastectomies)  CURRENT TREATMENT: paclitaxel; denosumab/Xgeva   INTERVAL HISTORY: Debbie [name] returns today for follow-up of her estrogen receptor positive stage IV breast cancer.  She was switched to weekly paclitaxel at her last visit on 09/10/2020.  Today is day 15 cycle 1, her third dose.  She then will be off for 1 week before starting cycle 2..  She receives treatments days 1, 8 and 15 of each 28-day cycle  She continues on Xgeva every 4 weeks, with her most recent dose on 09/17/2020.  She has no side effects from this that she is aware of  Lab Results  Component Value Date   CA2729 306.3 (H) 09/17/2020   CA2729 316.9 (H) 09/10/2020   CA2729 177.3 (H) 08/22/2020   CA2729 117.5 (H) 07/18/2020   CA2729 76.3 (H) 06/20/2020    REVIEW OF SYSTEMS: Debbie [name] had significant bone pain after the first cycle but luckily that did not recur with cycle #2.  She had minimal if any discomfort.  She has baseline neuropathy involving the toes and balls of the feet and also her left pinky.  This is her baseline neuropathy and she tells me that it has not changed.   She had mild constipation with cycle 1 but that did not  recur.  Her hair however is now beginning to shed.  She does have a weight that she plans to start wearing.  Her appetite comes and goes and she has some taste alteration.  She is sleeping well and she continues to work full-time.  A detailed review of systems today was otherwise stable   COVID 19 VACCINATION STATUS: Status post Pfizer x2, with booster January 2021   BREAST CANCER HISTORY: From the original intake note:  Debbie [name] had screening mammography with tomosynthesis at the [facility] 10/02/2015. This showed 2 possible masses in the right breast as well as some suspicious calcifications. The left breast was unremarkable. On 10/15/2015 she underwent right diagnostic mammography with tomography and right breast ultrasonography. The breast density was category B. Spot magnification views confirmed an area of pleomorphic calcifications measuring up to 5 cm in the upper half of the breast. Also in the upper outer right breast there were adjacent low-density masses without distortion or calcifications. On physical exam there was no palpable abnormality.  Targeted ultrasound of the right breast found the 2 "masses" in the right upper quadrant were benign cysts measuring 0.8 and 0.5 cm respectively. Biopsy of the area of calcification on 10/15/2015 showed ([id number]) invasive ductal carcinoma, E-cadherin positive, grade 2, estrogen receptor 90% positive, progesterone receptor 50% positive, both with strong staining intensity, with an MIB-1 of 70%, and no HER-2 amplification, the signals ratio being 1.18 and the number per cell 2.60.  Her subsequent history is as detailed below    PAST MEDICAL HISTORY: Past Medical History:  Diagnosis Date  . Anxiety   . Arthritis   . Bone metastasis ([place])   . Breast cancer of upper-inner quadrant of right female breast [facility]) oncologist-  dr [name]--- 04/ 2019 ER/PR+ Stage IV w/ metastatic disease to liver, total spine, lung, bone   dx 06/ 2017  right  breast invasive ductal carcinoma, Stage IIB, ER/PR + (pT2 pN1), grade 1-- s/p  right breast lumpecotmy w/ sln bx's and bilateral breast reduction 11-28-2015/  left breast with reduction , dx invasive ductal ca, Grade 1 (pT1cpNX) ER/PR positive/  adjuvant radiation completed 03-27-2016 to right chest wall, 2018  reclassifiec Stage IIA  . Cancer, metastatic to liver ([place])   . Chronic back pain   . Constipation   . Depression   . Family history of adverse reaction to anesthesia     mother has Copd was kept on vent   . Headache   . Heart murmur   . History of hyperthyroidism    s/p  RAI  . History of radiation therapy 02/11/16- 03/27/16   Right Chest Wall 50.4 Gy in 28 fractions, Right Chest Wall Boost 10 Gy in 5 fractions.   . Hyperlipidemia   . Hypertension   . Hypothyroidism, postradioiodine therapy    endocrinoloigst-  dr balan--  2004  s/p  RAI  . Menorrhagia 2011  . Multiple pulmonary nodules    secondary  metastatic  . PONV (postoperative nausea and vomiting)    patch helps   . SUI (stress urinary incontinence, female)   . Wears contact lenses   . Wears dentures    full upper and lower partial    PAST SURGICAL HISTORY: Past Surgical History:  Procedure Laterality Date  . BREAST LUMPECTOMY WITH NEEDLE LOCALIZATION AND AXILLARY SENTINEL LYMPH NODE BX Right 11/28/2015   Procedure: RIGHT BREAST LUMPECTOMY WITH NEEDLE LOCALIZATION AND AXILLARY SENTINEL LYMPH NODE BX;  Surgeon: Autumn Messing III, MD;  Location: [place];  Service: General;  Laterality: Right;  . BREAST REDUCTION SURGERY Bilateral 11/28/2015   Procedure: MAMMARY REDUCTION  (BREAST) BILATERAL;  Surgeon: Debbie Reese, MD;  Location: [place];  Service: Plastics;  Laterality: Bilateral;  . CESAREAN SECTION  01/12/1993  . COLONOSCOPY    . Dental surgeries    . FEMUR IM NAIL Left 01/28/2018   Procedure: INTRAMEDULLARY (IM) NAIL FEMORAL;  Surgeon: Nicholes Stairs, MD;  Location: [place];  Service: Orthopedics;  Laterality: Left;   . HYSTEROSCOPY N/A 09/02/2017   Procedure: HYSTEROSCOPY  to remove IUD;  Surgeon: Eldred Manges, MD;  Location: [facility];  Service: Gynecology;  Laterality: N/A;  . IR IMAGING GUIDED PORT INSERTION  09/16/2020  . Liver biospy    . MASTECTOMY    . MASTECTOMY W/ SENTINEL NODE BIOPSY Bilateral 12/26/2015   Procedure: BILATERAL MASTECTOMY WITH LEFT SENTINEL LYMPH NODE BIOPSY;  Surgeon: Autumn Messing III, MD;  Location: [place];  Service: General;  Laterality: Bilateral;  . Removal of IUD    . TUBAL LIGATION Bilateral 1995    FAMILY HISTORY: Family History  Problem Relation Age of Onset  . Diabetes Mother   . Hypertension Mother   . Hypertension Father   . Diabetes Maternal Grandmother   . Hypertension Sister   . Cancer Paternal Grandmother        colon  . Colon cancer Paternal Grandmother   . Esophageal cancer Neg Hx   . Stomach cancer Neg Hx   . Rectal cancer Neg Hx   The patient's parents  are still living, in their early 67s as of June 2017. The patient has one brother, 2 sisters. On the maternal side there is a history of uterine and prostate cancer. On the paternal side there is a history of colon cancer and possibly uterine cancer. There is no history of breast or ovarian cancer in the family.    GYNECOLOGIC HISTORY:  Patient's last menstrual period was 12/19/2015.  Menarche age 67. She still having regular periods. She is GX P1, first pregnancy age 64. She used oral contraceptives for a few years remotely, with no complications. She is currently on a Mirena IUD and also is status post bilateral tubal ligation.   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 Her husband [name] is a Administrator.  They are both taking appropriate precautions at work.  Their son [name], age 29, is a radio DJ.  He no longer lives with the patient.  The patient has no grandchildren. She is not a Ambulance person.   ADVANCED DIRECTIVES: In  the absence of any documents to the contrary her husband is her healthcare power of attorney   HEALTH MAINTENANCE: Social History   Tobacco Use  . Smoking status: Never Smoker  . Smokeless tobacco: Never Used  Vaping Use  . Vaping Use: Never used  Substance Use Topics  . Alcohol use: No  . Drug use: No     Colonoscopy: January 2016   PAP: January 2016   Bone density: August 2017 showed a T score of +0.7 normal  Lipid panel:  Allergies  Allergen Reactions  . Ace Inhibitors Cough  . Atorvastatin Other (See Comments)    Joint pain  . Simvastatin Other (See Comments)    Joint pain    Current Outpatient Medications  Medication Sig Dispense Refill  . blood glucose meter kit and supplies KIT Dispense based on patient and insurance preference. Use up to four times daily as directed. (FOR ICD-9 250.00, 250.01). 1 each 0  . Calcium-Magnesium 250-125 MG TABS Take 1 tablet by mouth daily. 120 each 4  . cetirizine (ZYRTEC) 10 MG tablet Take 10 mg by mouth daily as needed for allergies.     . chlorhexidine (PERIDEX) 0.12 % solution Use 5 mLs in the mouth or throat 2 (two) times daily as needed as directed. Do not swallow 473 mL 6  . Ferrous Sulfate (IRON) 325 (65 Fe) MG TABS Take 1 tablet (325 mg total) by mouth daily. 30 each 2  . fluconazole (DIFLUCAN) 100 MG tablet TAKE 2 TABLETS BY MOUTH FOR THE 1ST DOSE THEN 1 TABLET DAILY 8 tablet 0  . furosemide (LASIX) 20 MG tablet Take 20 mg by mouth daily as needed.    Marland Kitchen glucose blood test strip USE AS DIRECTED TO TEST UP TO FOUR TIMES DAILY (Patient taking differently: USE AS DIRECTED TO TEST UP TO FOUR TIMES DAILY) 100 strip 0  . ibuprofen (ADVIL) 800 MG tablet Take 800 mg by mouth every 6 (six) hours as needed.    . Lancets (ONETOUCH ULTRASOFT) lancets Use as instructed 100 each 12  . levothyroxine (SYNTHROID) 137 MCG tablet TAKE 1 TABLET BY MOUTH ON AN EMPTY STOMACH EVERY MORNING DAILY 90 DAYS 90 tablet 3  . lidocaine-prilocaine (EMLA)  cream Apply to affected area once 30 g 3  . LORazepam (ATIVAN) 1 MG tablet TAKE 1/2 TO 1 TABLET BY MOUTH AT BEDTIME AS NEEDED 30 tablet 0  . losartan-hydrochlorothiazide (HYZAAR) 100-25 MG  tablet TAKE 1 TABLET BY MOUTH ONCE DAILY 90 tablet 1  . Multiple Vitamin (MULTIVITAMIN) tablet Take 1 tablet by mouth daily.    Marland Kitchen omega-3 acid ethyl esters (LOVAZA) 1 g capsule Take 1 g by mouth 2 (two) times daily.     . potassium chloride SA (K-DUR,KLOR-CON) 20 MEQ tablet Take 40 mEq by mouth daily.     . pregabalin (LYRICA) 100 MG capsule TAKE 1 CAPSULE BY MOUTH 3 TIMES A DAY 90 capsule 0  . prochlorperazine (COMPAZINE) 10 MG tablet Take 1 tablet (10 mg total) by mouth every 6 (six) hours as needed (Nausea or vomiting). 30 tablet 1  . rosuvastatin (CRESTOR) 5 MG tablet TAKE 1 TABLET BY MOUTH ONCE DAILY 30 tablet 7  . sertraline (ZOLOFT) 100 MG tablet TAKE 1 TABLET BY MOUTH ONCE A DAY 30 tablet 12  . traMADol (ULTRAM) 50 MG tablet Take 50 mg by mouth every 6 (six) hours as needed.    . valACYclovir (VALTREX) 1000 MG tablet Take 1 tablet (1,000 mg total) by mouth daily. 60 tablet 6  . Vitamin D, Cholecalciferol, 1000 units CAPS Take 1 tablet by mouth daily. (Patient taking differently: Take 1,000 Units by mouth daily.) 90 capsule 4   No current facility-administered medications for this visit.    OBJECTIVE: African-American woman who appears stated age  71:   09/24/20 0929  BP: 133/79  Pulse: 85  Resp: 18  Temp: 97.9 F (36.6 C)  SpO2: 97%     Body mass index is 45.16 kg/m.    ECOG FS:1 - Symptomatic but completely ambulatory  Filed Weights   09/24/20 0929  Weight: 279 lb 12.8 oz (126.9 kg)     Sclerae unicteric, EOMs intact Wearing a mask No cervical or supraclavicular adenopathy Lungs no rales or rhonchi Heart regular rate and rhythm Abd soft, obese, nontender, positive bowel sounds MSK no focal spinal tenderness, chronic grade 2 right upper extremity lymphedema, compression sleeve in  place Neuro: nonfocal, well oriented, appropriate affect Breasts: Deferred   LAB RESULTS:  CMP     Component Value Date/Time   NA 140 09/17/2020 0917   NA 140 07/08/2016 0948   K 4.0 09/17/2020 0917   K 4.1 07/08/2016 0948   CL 103 09/17/2020 0917   CO2 28 09/17/2020 0917   CO2 24 07/08/2016 0948   GLUCOSE 95 09/17/2020 0917   GLUCOSE 89 07/08/2016 0948   BUN 15 09/17/2020 0917   BUN 11.9 07/08/2016 0948   CREATININE 0.92 09/17/2020 0917   CREATININE 0.84 08/22/2020 1050   CREATININE 0.9 07/08/2016 0948   CALCIUM 9.2 09/17/2020 0917   CALCIUM 10.0 07/08/2016 0948   PROT 7.1 09/17/2020 0917   PROT 7.6 07/08/2016 0948   ALBUMIN 3.3 (L) 09/17/2020 0917   ALBUMIN 3.6 07/08/2016 0948   AST 17 09/17/2020 0917   AST 16 08/22/2020 1050   AST 22 07/08/2016 0948   ALT 14 09/17/2020 0917   ALT 14 08/22/2020 1050   ALT 27 07/08/2016 0948   ALKPHOS 140 (H) 09/17/2020 0917   ALKPHOS 115 07/08/2016 0948   BILITOT 0.3 09/17/2020 0917   BILITOT 0.3 08/22/2020 1050   BILITOT 0.35 07/08/2016 0948   GFRNONAA >60 09/17/2020 0917   GFRNONAA >60 08/22/2020 1050   GFRAA >60 01/30/2020 0814   GFRAA >60 05/16/2019 0905    INo results found for: SPEP, UPEP  Lab Results  Component Value Date   WBC 3.3 (L) 09/24/2020   NEUTROABS 2.3 09/24/2020  HGB 11.2 (L) 09/24/2020   HCT 34.5 (L) 09/24/2020   MCV 94.0 09/24/2020   PLT 283 09/24/2020      Chemistry      Component Value Date/Time   NA 140 09/17/2020 0917   NA 140 07/08/2016 0948   K 4.0 09/17/2020 0917   K 4.1 07/08/2016 0948   CL 103 09/17/2020 0917   CO2 28 09/17/2020 0917   CO2 24 07/08/2016 0948   BUN 15 09/17/2020 0917   BUN 11.9 07/08/2016 0948   CREATININE 0.92 09/17/2020 0917   CREATININE 0.84 08/22/2020 1050   CREATININE 0.9 07/08/2016 0948      Component Value Date/Time   CALCIUM 9.2 09/17/2020 0917   CALCIUM 10.0 07/08/2016 0948   ALKPHOS 140 (H) 09/17/2020 0917   ALKPHOS 115 07/08/2016 0948   AST 17  09/17/2020 0917   AST 16 08/22/2020 1050   AST 22 07/08/2016 0948   ALT 14 09/17/2020 0917   ALT 14 08/22/2020 1050   ALT 27 07/08/2016 0948   BILITOT 0.3 09/17/2020 0917   BILITOT 0.3 08/22/2020 1050   BILITOT 0.35 07/08/2016 0948       No results found for: LABCA2  No components found for: [id number]  No results for input(s): INR in the last 168 hours.  Urinalysis No results found for: COLORURINE, APPEARANCEUR, LABSPEC, PHURINE, GLUCOSEU, HGBUR, BILIRUBINUR, KETONESUR, PROTEINUR, UROBILINOGEN, NITRITE, LEUKOCYTESUR   STUDIES: CT Chest W Contrast  Result Date: 09/04/2020 CLINICAL DATA:  Breast cancer.  Restaging. EXAM: CT CHEST WITH CONTRAST TECHNIQUE: Multidetector CT imaging of the chest was performed during intravenous contrast administration. CONTRAST:  30m OMNIPAQUE IOHEXOL 300 MG/ML  SOLN COMPARISON:  08/01/2019 FINDINGS: Cardiovascular: Heart size upper normal. No thoracic aortic aneurysm. Mediastinum/Nodes: New subcarinal lymphadenopathy with 1.7 cm short axis lymph node visible on 74/3. New lymphadenopathy in the right hilum measures 12 mm short axis on 78/3. No axillary lymphadenopathy. Lungs/Pleura: Stable right apical scarring. 9 mm left perifissural nodule identified previously is stable. Multiple new nodules are identified in the lungs bilaterally. 4 mm right upper lobe nodule on 56/6 is new in the interval. 5 mm right parahilar nodule on 78/6 is new. New 5 mm right lower lobe nodule on 104/6, 4 mm left upper lobe nodule on 46/6, and 4 mm left upper lobe nodule on 42/6. There is a new area of soft tissue pleural thickening in the left hemithorax laterally, best seen on image 79/3. Upper Abdomen: Multiple low-density liver lesions are compatible with metastases better characterized on abdominal MRI of 08/23/2020 Musculoskeletal: Similar appearance of mixed density bone lesions. Index T10 lesion measured previously at 13 mm is 14 mm today on image 110/3. IMPRESSION: 1. New  mediastinal and right hilar lymphadenopathy with multiple new tiny bilateral pulmonary nodules. Imaging features are compatible with metastatic disease. 2. Interval development of soft tissue pleural thickening in the left hemithorax laterally, suspicious for metastatic involvement. Close attention on follow-up recommended. 3. Similar appearance of mixed density bone lesions. 4. Multiple hepatic lesions consistent with metastatic disease and better characterized on recent abdominal MRI. 5. Aortic Atherosclerosis (ICD10-I70.0). Electronically Signed   By: EMisty StanleyM.D.   On: 09/04/2020 10:57   IR IMAGING GUIDED PORT INSERTION  Result Date: 09/16/2020 INDICATION: History of recurrent metastatic breast cancer. Please perform image guided placement of port a catheter for durable intravenous access for chemotherapy administration. EXAM: IMPLANTED PORT A CATH PLACEMENT WITH ULTRASOUND AND FLUOROSCOPIC GUIDANCE COMPARISON:  Chest CT-09/03/2020 MEDICATIONS: Zofran 4 mg IV ANESTHESIA/SEDATION:  Moderate (conscious) sedation was employed during this procedure. A total of Versed 2 mg and Fentanyl 100 mcg was administered intravenously. Moderate Sedation Time: 23 minutes. The patient's level of consciousness and vital signs were monitored continuously by radiology nursing throughout the procedure under my direct supervision. CONTRAST:  None FLUOROSCOPY TIME:  42 seconds (22 mGy) COMPLICATIONS: None immediate. PROCEDURE: The procedure, risks, benefits, and alternatives were explained to the patient. Questions regarding the procedure were encouraged and answered. The patient understands and consents to the procedure. The right neck and chest were prepped with chlorhexidine in a sterile fashion, and a sterile drape was applied covering the operative field. Maximum barrier sterile technique with sterile gowns and gloves were used for the procedure. A timeout was performed prior to the initiation of the procedure. Local  anesthesia was provided with 1% lidocaine with epinephrine. After creating a small venotomy incision, a micropuncture kit was utilized to access the internal jugular vein. Real-time ultrasound guidance was utilized for vascular access including the acquisition of a permanent ultrasound image documenting patency of the accessed vessel. The microwire was utilized to measure appropriate catheter length. A subcutaneous port pocket was then created along the upper chest wall utilizing a combination of sharp and blunt dissection. The pocket was irrigated with sterile saline. A single lumen "Slim" sized power injectable port was chosen for placement. The 8 Fr catheter was tunneled from the port pocket site to the venotomy incision. The port was placed in the pocket. The external catheter was trimmed to appropriate length. At the venotomy, an 8 Fr peel-away sheath was placed over a guidewire under fluoroscopic guidance. The catheter was then placed through the sheath and the sheath was removed. Final catheter positioning was confirmed and documented with a fluoroscopic spot radiograph. The port was accessed with a Huber needle, aspirated and flushed with heparinized saline. The venotomy site was closed with an interrupted 4-0 Vicryl suture. The port pocket incision was closed with interrupted 2-0 Vicryl suture. The skin was opposed with a running subcuticular 4-0 Vicryl suture. Dermabond and Steri-strips were applied to both incisions. Dressings were applied. The patient tolerated the procedure well without immediate post procedural complication. FINDINGS: After catheter placement, the tip lies within the superior cavoatrial junction. The catheter aspirates and flushes normally and is ready for immediate use. IMPRESSION: Successful placement of a right internal jugular approach power injectable Port-A-Cath. The catheter is ready for immediate use. Electronically Signed   By: Sandi Mariscal M.D.   On: 09/16/2020 14:52      ELIGIBLE FOR AVAILABLE RESEARCH PROTOCOL: PALLAS, but inelegible because of Mirena IUD   ASSESSMENT: 55 y.o. [place] woman status post right breast upper inner quadrant biopsy 10/15/2015 for a clinical T2-T3 N0, stage II invasive ductal carcinoma, estrogen and progesterone receptor positive, HER-2 nonamplified, with an Mib-1 of 70%  (1) status post right lumpectomy and sentinel lymph node sampling with oncoplastic breast reduction 11/28/2015 for a pT2 pN1, stage IIB invasive ductal carcinoma, grade 1, with negative margins (1/1 sentinel node positive)  (a) reclassified as stage IIA in the 2018 prognostic revision   (2) left reduction mammoplasty 11/28/2015 unexpectedly found a pT1c pNX invasive ductal carcinoma, grade 1, estrogen and progesterone receptor positive, HER-2 not amplified, with an MIB-1 of 3%  (3) Mammaprint from the right sided breast cancer was "low risk", predicting a 98% chance of no disease recurrence within 5 years with anti-estrogens as the only systemic therapy. It also predicts minimal to no benefit from chemotherapy  (  4) status post bilateral mastectomies with bilateral sentinel lymph node sampling 12/26/2015, showing  (a) on the right, no residual carcinoma (0/3 nodes involved)  (b) on the left, focal ductal carcinoma in situ, 0.2 cm, with negative margins; (0/4 nodes involved)  (5) adjuvant radiation 02/11/16 - 03/27/16    1) Right chest wall: 50.4 Gy in 28 fractions               2) Right chest wall boost: 10 Gy in 5 fractions   (6) started on tamoxifen neoadjuvantly 10/23/2015 to allow for expected delays in definitive local treatment  (a) Mirena IUD in place  (b) tamoxifen discontinued 08/13/2017, with progression  (7) since she had 4 lymph nodes removed from each axilla but also receive radiation on the right, lab draws should preferentially be obtained from the left arm   METASTATIC DISEASE: April 2019 (8) CT scan of the abdomen and pelvis  08/04/2017, MRI of the liver and total spine MRI 08/11/2017 showed liver and bone metastases  (a) liver biopsy 09/07/2017 confirms estrogen receptor positive, progesterone receptor negative, HER-2 negative metastatic breast cancer  (b) chest CT scan 08/13/2017 shows multiple bilateral pulmonary nodules.  (c) CT of the brain with and without contrast 08/26/2017 shows no evidence of metastatic disease to the brain  (d) CA 27-29 is informative  (9) fulvestrant started 08/17/2017  (a) palbociclib started 08/31/2017  (b) fulvestrant and palbociclib discontinued 01/04/2018 with evidence of progression in the liver  (10) denosumab/Xgeva started 09/14/2017  (a) held after 03/01/2018 dose because concern re. osteonecrosis  (b) resumed 06/09/2018  (c) 02/21/2019 dose held due to oral surgery, resumed 03/21/2019  (11) anastrozole started 01/04/2018  (a) everolimus started 01/29/2018  (b) liver MRI and bone scan December 2019 show stable disease  (c) chest CT scan on 08/02/2018 shows lung lesions resolved, bone lesions stable  (d) bone scan 09/13/2018 essentially stable  (e) liver MRI 12/08/2018 shows improvement in hepatic metastsases  (f) liver MRI and bone scan 04/13/2019 showed continuing improvement in the liver, stable bone lesions  (g) chest CT 08/01/2019 stable, no evidence of active disease ows  (12) 01/28/18 prophylactic left femur intramedullary nail surgery for impending left femur pathologic fracture  (a) palliative radiation with Dr. [name] to the left femur and left ilium from 02/16/2018 to 03/01/2018  (13) [facility] report on liver biopsy from April 2019 shows a negative PDL 1, stable MSI and proficient mismatch repair status.  The tumor is positive for the androgen receptor, and for PTEN for PIK3 CA mutations.  HER-2/neu was read as equivocal (it was negative by FISH on the pathology report).  (14) Progression noted in liver MRI on 01/11/2020; fulvestrant started on 01/30/2020  (a)  alpelisib started 01/30/2020 at 300 mg daily with Metformin 500 mg daily  (b) alpelisib held on 02/07/2020 with very high blood sugars.  Metformin increased to 1 g daily  (c) alpelisib resumed at 150 mg daily on 02/13/2020  (d) liver MRI 05/06/2020 shows evidence of disease progression  (e) fulvestrant and alpelisib discontinued January 2022  (15) capecitabine started 05/22/2020 at 1.5 g twice daily, 14/7  (a) abdominal MRI 08/23/2020 shows worsening liver disease  (b) chest CT scan 09/04/2020 shows new lung metastases  (c) capecitabine discontinued April 2022  (16) paclitaxel started 09/10/2020, repeated days 1, 8, and 15 of each 28-day cycle   PLAN: Jeany did better with her second dose of paclitaxel, with less achiness and constipation.  We are proceeding with her third dose  today after which she will have a week "off".  The concern of course is neuropathy.  She has some tingling and numbness in her toes primarily which she notices at night.  This was present at baseline and to the best of her and my ability to tell has not progressed.  She is very motivated to receive treatment and patients do tend to minimize symptoms as a result.  I have urged her not to do that and to keep a very close eye on the neuropathy issue.  If this progresses I will drop the dose or eventually switch her to a different set of chemotherapy agents (likely carboplatin and gemcitabine).  I expressed our sympathy to her upcoming hair loss.  She is very still worried about it.  I think it is wonderful that she is able to continue to work but she understands if that becomes too difficult I will be glad to write a letter for her  Total encounter time 25 minutes.Sarajane Jews C. [name], MD 09/24/20 9:45 AM Medical Oncology and Hematology [facility] [place], [place] [address] Tel. [phone]    Fax. [phone]   I, Wilburn Mylar, am acting as scribe for Dr. Virgie [name].  Debbie [name].  I, Lurline Del MD, have reviewed the above documentation for accuracy and completeness, and I agree with the above.   *Total Encounter Time as defined by the Centers for Medicare and Medicaid Services includes, in addition to the face-to-face time of a patient visit (documented in the note above) non-face-to-face time: obtaining and reviewing outside history, ordering and reviewing medications, tests or procedures, care coordination (communications with other health care professionals or caregivers) and documentation in the medical record.

## 2020-09-24 ENCOUNTER — Other Ambulatory Visit: Payer: Self-pay

## 2020-09-24 ENCOUNTER — Inpatient Hospital Stay: Payer: 59

## 2020-09-24 ENCOUNTER — Inpatient Hospital Stay (HOSPITAL_BASED_OUTPATIENT_CLINIC_OR_DEPARTMENT_OTHER): Payer: 59 | Admitting: Oncology

## 2020-09-24 VITALS — BP 133/79 | HR 85 | Temp 97.9°F | Resp 18 | Ht 66.0 in | Wt 279.8 lb

## 2020-09-24 DIAGNOSIS — Z170 Estrogen receptor positive status [ER+]: Secondary | ICD-10-CM

## 2020-09-24 DIAGNOSIS — C50112 Malignant neoplasm of central portion of left female breast: Secondary | ICD-10-CM | POA: Diagnosis not present

## 2020-09-24 DIAGNOSIS — C50211 Malignant neoplasm of upper-inner quadrant of right female breast: Secondary | ICD-10-CM

## 2020-09-24 DIAGNOSIS — Z79899 Other long term (current) drug therapy: Secondary | ICD-10-CM | POA: Diagnosis not present

## 2020-09-24 DIAGNOSIS — C787 Secondary malignant neoplasm of liver and intrahepatic bile duct: Secondary | ICD-10-CM

## 2020-09-24 DIAGNOSIS — Z923 Personal history of irradiation: Secondary | ICD-10-CM | POA: Diagnosis not present

## 2020-09-24 DIAGNOSIS — Z5111 Encounter for antineoplastic chemotherapy: Secondary | ICD-10-CM | POA: Diagnosis not present

## 2020-09-24 DIAGNOSIS — C7951 Secondary malignant neoplasm of bone: Secondary | ICD-10-CM | POA: Diagnosis not present

## 2020-09-24 LAB — CBC WITH DIFFERENTIAL/PLATELET
Abs Immature Granulocytes: 0.02 10*3/uL (ref 0.00–0.07)
Basophils Absolute: 0 10*3/uL (ref 0.0–0.1)
Basophils Relative: 1 %
Eosinophils Absolute: 0.1 10*3/uL (ref 0.0–0.5)
Eosinophils Relative: 4 %
HCT: 34.5 % — ABNORMAL LOW (ref 36.0–46.0)
Hemoglobin: 11.2 g/dL — ABNORMAL LOW (ref 12.0–15.0)
Immature Granulocytes: 1 %
Lymphocytes Relative: 16 %
Lymphs Abs: 0.5 10*3/uL — ABNORMAL LOW (ref 0.7–4.0)
MCH: 30.5 pg (ref 26.0–34.0)
MCHC: 32.5 g/dL (ref 30.0–36.0)
MCV: 94 fL (ref 80.0–100.0)
Monocytes Absolute: 0.3 10*3/uL (ref 0.1–1.0)
Monocytes Relative: 9 %
Neutro Abs: 2.3 10*3/uL (ref 1.7–7.7)
Neutrophils Relative %: 69 %
Platelets: 283 10*3/uL (ref 150–400)
RBC: 3.67 MIL/uL — ABNORMAL LOW (ref 3.87–5.11)
RDW: 16.7 % — ABNORMAL HIGH (ref 11.5–15.5)
WBC: 3.3 10*3/uL — ABNORMAL LOW (ref 4.0–10.5)
nRBC: 0 % (ref 0.0–0.2)

## 2020-09-24 LAB — COMPREHENSIVE METABOLIC PANEL
ALT: 17 U/L (ref 0–44)
AST: 16 U/L (ref 15–41)
Albumin: 3.3 g/dL — ABNORMAL LOW (ref 3.5–5.0)
Alkaline Phosphatase: 145 U/L — ABNORMAL HIGH (ref 38–126)
Anion gap: 10 (ref 5–15)
BUN: 13 mg/dL (ref 6–20)
CO2: 28 mmol/L (ref 22–32)
Calcium: 9.1 mg/dL (ref 8.9–10.3)
Chloride: 102 mmol/L (ref 98–111)
Creatinine, Ser: 0.78 mg/dL (ref 0.44–1.00)
GFR, Estimated: >60 mL/min (ref >60)
Glucose, Bld: 91 mg/dL (ref 70–99)
Potassium: 3.5 mmol/L (ref 3.5–5.1)
Sodium: 140 mmol/L (ref 135–145)
Total Bilirubin: 0.3 mg/dL (ref 0.3–1.2)
Total Protein: 7 g/dL (ref 6.5–8.1)

## 2020-09-24 MED ORDER — FAMOTIDINE 20 MG IN NS 100 ML IVPB
20.0000 mg | Freq: Once | INTRAVENOUS | Status: AC
  Administered 2020-09-24: 20 mg via INTRAVENOUS

## 2020-09-24 MED ORDER — HEPARIN SOD (PORK) LOCK FLUSH 100 UNIT/ML IV SOLN
500.0000 [IU] | Freq: Once | INTRAVENOUS | Status: AC | PRN
  Administered 2020-09-24: 500 [IU]
  Filled 2020-09-24: qty 5

## 2020-09-24 MED ORDER — FAMOTIDINE 20 MG IN NS 100 ML IVPB
INTRAVENOUS | Status: AC
  Filled 2020-09-24: qty 100

## 2020-09-24 MED ORDER — SODIUM CHLORIDE 0.9 % IV SOLN
10.0000 mg | Freq: Once | INTRAVENOUS | Status: AC
  Administered 2020-09-24: 10 mg via INTRAVENOUS
  Filled 2020-09-24: qty 10

## 2020-09-24 MED ORDER — SODIUM CHLORIDE 0.9 % IV SOLN
Freq: Once | INTRAVENOUS | Status: AC
  Filled 2020-09-24: qty 250

## 2020-09-24 MED ORDER — SODIUM CHLORIDE 0.9 % IV SOLN
80.0000 mg/m2 | Freq: Once | INTRAVENOUS | Status: AC
  Administered 2020-09-24: 192 mg via INTRAVENOUS
  Filled 2020-09-24: qty 32

## 2020-09-24 MED ORDER — DIPHENHYDRAMINE HCL 50 MG/ML IJ SOLN
INTRAMUSCULAR | Status: AC
  Filled 2020-09-24: qty 1

## 2020-09-24 MED ORDER — SODIUM CHLORIDE 0.9% FLUSH
10.0000 mL | INTRAVENOUS | Status: DC | PRN
  Administered 2020-09-24: 10 mL
  Filled 2020-09-24: qty 10

## 2020-09-24 MED ORDER — DIPHENHYDRAMINE HCL 50 MG/ML IJ SOLN
25.0000 mg | Freq: Once | INTRAMUSCULAR | Status: AC
  Administered 2020-09-24: 25 mg via INTRAVENOUS

## 2020-09-24 NOTE — Patient Instructions (Signed)
[facility] MEDICAL ONCOLOGY  Discharge Instructions: Thank you for choosing [facility] to provide your oncology and hematology care.   If you have a lab appointment with the Cancer Center, please go directly to the Cancer Center and check in at the registration area.   Wear comfortable clothing and clothing appropriate for easy access to any Portacath or PICC line.   We strive to give you quality time with your provider. You may need to reschedule your appointment if you arrive late (15 or more minutes).  Arriving late affects you and other patients whose appointments are after yours.  Also, if you miss three or more appointments without notifying the office, you may be dismissed from the clinic at the provider's discretion.      For prescription refill requests, have your pharmacy contact our office and allow 72 hours for refills to be completed.    Today you received the following chemotherapy and/or immunotherapy agent: Paclitaxel (Taxol)   To help prevent nausea and vomiting after your treatment, we encourage you to take your nausea medication as directed.  BELOW ARE SYMPTOMS THAT SHOULD BE REPORTED IMMEDIATELY: *FEVER GREATER THAN 100.4 F (38 C) OR HIGHER *CHILLS OR SWEATING *NAUSEA AND VOMITING THAT IS NOT CONTROLLED WITH YOUR NAUSEA MEDICATION *UNUSUAL SHORTNESS OF BREATH *UNUSUAL BRUISING OR BLEEDING *URINARY PROBLEMS (pain or burning when urinating, or frequent urination) *BOWEL PROBLEMS (unusual diarrhea, constipation, pain near the anus) TENDERNESS IN MOUTH AND THROAT WITH OR WITHOUT PRESENCE OF ULCERS (sore throat, sores in mouth, or a toothache) UNUSUAL RASH, SWELLING OR PAIN  UNUSUAL VAGINAL DISCHARGE OR ITCHING   Items with * indicate a potential emergency and should be followed up as soon as possible or go to the Emergency Department if any problems should occur.  Please show the CHEMOTHERAPY ALERT CARD or IMMUNOTHERAPY ALERT CARD at  check-in to the Emergency Department and triage nurse.  Should you have questions after your visit or need to cancel or reschedule your appointment, please contact [facility]ICAL [facility]COL[facility]  Dept: [phone]  and follow the prompts.  Office hours are 8:00 a.m. to 4:30 p.m. Monday - Friday. Please note that voicemails left after 4:00 p.m. may not be returned until the following business day.  We are closed weekends and major holidays. You have access to a nurse at all times for urgent questions. Please call the main number to the clinic Dept: [phone] and follow the prompts.   For any non-urgent questions, you may also contact your provider using MyChart. We now offer e-Visits for anyone 18 and older to request care online for non-urgent symptoms. For details visit [email or web address].   Also download the MyChart app! Go to the app store, search "MyChart", open the app, select [facility], and log in with your MyChart username and password.  Due to Covid, a mask is required upon entering the hospital/clinic. If you do not have a mask, one will be given to you upon arrival. For doctor visits, patients may have 1 support person aged 18 or older with them. For treatment visits, patients cannot have anyone with them due to current Covid guidelines and our immunocompromised population.  

## 2020-09-24 NOTE — Patient Instructions (Signed)

## 2020-09-25 LAB — CANCER ANTIGEN 27.29: CA 27.29: 222 U/mL — ABNORMAL HIGH (ref 0.0–38.6)

## 2020-10-01 ENCOUNTER — Ambulatory Visit: Payer: 59

## 2020-10-01 ENCOUNTER — Telehealth: Payer: Self-pay

## 2020-10-01 ENCOUNTER — Other Ambulatory Visit: Payer: 59

## 2020-10-01 NOTE — Telephone Encounter (Signed)
Patient has dental appointment tomorrow, 5/25, to have an evaluation due to pain from root canal in November.    Patient is s/p D15, C1 Taxol and also received Xgeva on 5/10.    Patient calling to have MD review prior to dental appointment since she is on Xgeva.

## 2020-10-01 NOTE — Telephone Encounter (Signed)
Per MD okay for evaluation, recommendations for no extractions.   RN notified patient, verbalized understanding.

## 2020-10-02 ENCOUNTER — Other Ambulatory Visit: Payer: Self-pay | Admitting: Oncology

## 2020-10-02 ENCOUNTER — Encounter: Payer: Self-pay | Admitting: Oncology

## 2020-10-02 ENCOUNTER — Telehealth: Payer: Self-pay | Admitting: *Deleted

## 2020-10-02 ENCOUNTER — Other Ambulatory Visit (HOSPITAL_COMMUNITY): Payer: Self-pay

## 2020-10-02 MED ORDER — TRAMADOL HCL 50 MG PO TABS
50.0000 mg | ORAL_TABLET | Freq: Four times a day (QID) | ORAL | 0 refills | Status: DC | PRN
  Filled 2020-10-02: qty 60, 15d supply, fill #0

## 2020-10-02 MED ORDER — IBUPROFEN 800 MG PO TABS
800.0000 mg | ORAL_TABLET | Freq: Two times a day (BID) | ORAL | 1 refills | Status: DC | PRN
  Filled 2020-10-02: qty 30, 15d supply, fill #0

## 2020-10-02 NOTE — Telephone Encounter (Signed)
Pt called to this RN today stating per acute assessment yesterday by dentist- she does not have issues in teeth regarding new onset of lower left gum discomfort.  She is scheduled today to see her attending dentist for further work up.  Debbie [name] states need for pain medication- she does not have any in the hmoe- prior use of tramadol with some benefit but ibuprofen 800 mg has actually been more effective in controlling her discomfort.  Per MD review- prescription given for ibuprofen and tramadol. Note MD recommends limited use of ibuprofen to 800 mg twice a day.  Recommended for pt to obtain a panoramic film today of jaw with dental visit today to evaluate for any necrosis per use of bisphosphonate therapy.  Debbie [name] verbalized understanding.

## 2020-10-04 ENCOUNTER — Encounter: Payer: Self-pay | Admitting: Adult Health

## 2020-10-04 ENCOUNTER — Other Ambulatory Visit: Payer: Self-pay

## 2020-10-04 ENCOUNTER — Other Ambulatory Visit (HOSPITAL_COMMUNITY): Payer: Self-pay

## 2020-10-04 ENCOUNTER — Telehealth: Payer: Self-pay | Admitting: *Deleted

## 2020-10-04 ENCOUNTER — Inpatient Hospital Stay (HOSPITAL_BASED_OUTPATIENT_CLINIC_OR_DEPARTMENT_OTHER): Payer: 59 | Admitting: Adult Health

## 2020-10-04 VITALS — BP 191/104 | HR 98 | Temp 97.2°F | Resp 20 | Ht 66.0 in | Wt 285.1 lb

## 2020-10-04 DIAGNOSIS — Z5111 Encounter for antineoplastic chemotherapy: Secondary | ICD-10-CM | POA: Diagnosis not present

## 2020-10-04 DIAGNOSIS — C787 Secondary malignant neoplasm of liver and intrahepatic bile duct: Secondary | ICD-10-CM

## 2020-10-04 DIAGNOSIS — C50111 Malignant neoplasm of central portion of right female breast: Secondary | ICD-10-CM | POA: Diagnosis not present

## 2020-10-04 DIAGNOSIS — C7951 Secondary malignant neoplasm of bone: Secondary | ICD-10-CM

## 2020-10-04 DIAGNOSIS — Z923 Personal history of irradiation: Secondary | ICD-10-CM | POA: Diagnosis not present

## 2020-10-04 DIAGNOSIS — C7800 Secondary malignant neoplasm of unspecified lung: Secondary | ICD-10-CM

## 2020-10-04 DIAGNOSIS — Z170 Estrogen receptor positive status [ER+]: Secondary | ICD-10-CM | POA: Diagnosis not present

## 2020-10-04 DIAGNOSIS — C50112 Malignant neoplasm of central portion of left female breast: Secondary | ICD-10-CM | POA: Diagnosis not present

## 2020-10-04 DIAGNOSIS — Z79899 Other long term (current) drug therapy: Secondary | ICD-10-CM | POA: Diagnosis not present

## 2020-10-04 DIAGNOSIS — C50211 Malignant neoplasm of upper-inner quadrant of right female breast: Secondary | ICD-10-CM | POA: Diagnosis not present

## 2020-10-04 MED ORDER — AMOXICILLIN-POT CLAVULANATE 875-125 MG PO TABS
1.0000 | ORAL_TABLET | Freq: Two times a day (BID) | ORAL | 0 refills | Status: DC
  Filled 2020-10-04: qty 20, 10d supply, fill #0

## 2020-10-04 MED ORDER — OXYCODONE HCL 5 MG PO TABS
5.0000 mg | ORAL_TABLET | ORAL | 0 refills | Status: DC | PRN
  Filled 2020-10-04: qty 60, 10d supply, fill #0

## 2020-10-04 NOTE — Telephone Encounter (Signed)
Pt called to this RN to state ongoing left gum/jaw pain not resolved with current pain regimen of ibuprofen 800 mg bid and tramadol.  Of note she went to dentist- for evaluation of partial and plate with some adjusting but negative for any other dental issues including need for root canal.  She states pain in now radiating to left ear.  She states pain medication " really only last about an hour before the pain comes back "  This RN inquired about possible " grinding " of teeth/gums at night- with Jalyssa stating she does not think so but that " I am clenching my gums/teeth "  Per above and pending long weekend - with need for further pain control - pt put on schedule to see [name] today for symptom management.

## 2020-10-04 NOTE — Progress Notes (Signed)
[place]  [phone] [phone]    ID: Debbie [name] DOB: 1966-02-27  MR#: [id number]  VOZ#:[id number]  Patient Care Team: Kelton Pillar, MD as PCP - General (Family Medicine) Jacelyn Pi, MD as Consulting Physician (Endocrinology) Jovita Kussmaul, MD as Consulting Physician (General Surgery) [name], Virgie Dad, MD as Consulting Physician (Oncology) [name], MD as Attending Physician (Radiation Oncology) Belva Crome, MD as Consulting Physician (Cardiology) Crissie Reese, MD as Consulting Physician (Plastic Surgery) [name], [name], NP as Nurse Practitioner (Hematology and Oncology) Lajoyce Lauber, DMD (Dentistry) OTHER MD:   CHIEF COMPLAINT: Estrogen receptor positive stage IV  breast cancer (s/p bilateral mastectomies)  CURRENT TREATMENT: paclitaxel; denosumab/Xgeva   INTERVAL HISTORY: Debbie [name] returns today for urgent evaluation of her left mandible pain.  She says that in her lower lateral mandible she has been having a tooth ache.  It began slowly about one week ago, and she has been taking medication with aleve and tramadol.  It has begun to slowly worsen since that time period.  She saw her dentist and oral surgeon and underwent imaging of her teeth. She received a tramadol prescription, and it is helping but only lasts for about 2 hours, and isn't significantly improving.  .   She receives Taxol on days 1, 8 and 15 of each 28-day cycle.  She is due again for this next week    She continues on Xgeva every 4 weeks, with her most recent dose on 09/17/2020.  She has no side effects from this that she is aware of  Lab Results  Component Value Date   CA2729 222.0 (H) 09/24/2020   CA2729 306.3 (H) 09/17/2020   CA2729 316.9 (H) 09/10/2020   CA2729 177.3 (H) 08/22/2020   CA2729 117.5 (H) 07/18/2020    REVIEW OF SYSTEMS: See interval history above, otherwise non contributory.    COVID 19 VACCINATION STATUS:  Status post Pfizer x2, with booster January 2021   BREAST CANCER HISTORY: From the original intake note:  Debbie [name] had screening mammography with tomosynthesis at the [facility] 10/02/2015. This showed 2 possible masses in the right breast as well as some suspicious calcifications. The left breast was unremarkable. On 10/15/2015 she underwent right diagnostic mammography with tomography and right breast ultrasonography. The breast density was category B. Spot magnification views confirmed an area of pleomorphic calcifications measuring up to 5 cm in the upper half of the breast. Also in the upper outer right breast there were adjacent low-density masses without distortion or calcifications. On physical exam there was no palpable abnormality.  Targeted ultrasound of the right breast found the 2 "masses" in the right upper quadrant were benign cysts measuring 0.8 and 0.5 cm respectively. Biopsy of the area of calcification on 10/15/2015 showed ([id number]) invasive ductal carcinoma, E-cadherin positive, grade 2, estrogen receptor 90% positive, progesterone receptor 50% positive, both with strong staining intensity, with an MIB-1 of 70%, and no HER-2 amplification, the signals ratio being 1.18 and the number per cell 2.60.  Her subsequent history is as detailed below    PAST MEDICAL HISTORY: Past Medical History:  Diagnosis Date  . Anxiety   . Arthritis   . Bone metastasis ([place])   . Breast cancer of upper-inner quadrant of right female breast [facility]) oncologist-  dr [name]--- 04/ 2019 ER/PR+ Stage IV w/ metastatic disease to liver, total spine, lung, bone   dx 06/ 2017  right breast invasive ductal carcinoma, Stage IIB, ER/PR + (pT2 pN1),  grade 1-- s/p  right breast lumpecotmy w/ sln bx's and bilateral breast reduction 11-28-2015/  left breast with reduction , dx invasive ductal ca, Grade 1 (pT1cpNX) ER/PR positive/  adjuvant radiation completed 03-27-2016 to right chest wall, 2018   reclassifiec Stage IIA  . Cancer, metastatic to liver ([place])   . Chronic back pain   . Constipation   . Depression   . Family history of adverse reaction to anesthesia     mother has Copd was kept on vent   . Headache   . Heart murmur   . History of hyperthyroidism    s/p  RAI  . History of radiation therapy 02/11/16- 03/27/16   Right Chest Wall 50.4 Gy in 28 fractions, Right Chest Wall Boost 10 Gy in 5 fractions.   . Hyperlipidemia   . Hypertension   . Hypothyroidism, postradioiodine therapy    endocrinoloigst-  dr balan--  2004  s/p  RAI  . Menorrhagia 2011  . Multiple pulmonary nodules    secondary  metastatic  . PONV (postoperative nausea and vomiting)    patch helps   . SUI (stress urinary incontinence, female)   . Wears contact lenses   . Wears dentures    full upper and lower partial    PAST SURGICAL HISTORY: Past Surgical History:  Procedure Laterality Date  . BREAST LUMPECTOMY WITH NEEDLE LOCALIZATION AND AXILLARY SENTINEL LYMPH NODE BX Right 11/28/2015   Procedure: RIGHT BREAST LUMPECTOMY WITH NEEDLE LOCALIZATION AND AXILLARY SENTINEL LYMPH NODE BX;  Surgeon: Autumn Messing III, MD;  Location: [place];  Service: General;  Laterality: Right;  . BREAST REDUCTION SURGERY Bilateral 11/28/2015   Procedure: MAMMARY REDUCTION  (BREAST) BILATERAL;  Surgeon: Crissie Reese, MD;  Location: [place];  Service: Plastics;  Laterality: Bilateral;  . CESAREAN SECTION  01/12/1993  . COLONOSCOPY    . Dental surgeries    . FEMUR IM NAIL Left 01/28/2018   Procedure: INTRAMEDULLARY (IM) NAIL FEMORAL;  Surgeon: Nicholes Stairs, MD;  Location: [place];  Service: Orthopedics;  Laterality: Left;  . HYSTEROSCOPY N/A 09/02/2017   Procedure: HYSTEROSCOPY  to remove IUD;  Surgeon: Eldred Manges, MD;  Location: [facility];  Service: Gynecology;  Laterality: N/A;  . IR IMAGING GUIDED PORT INSERTION  09/16/2020  . Liver biospy    . MASTECTOMY    . MASTECTOMY W/ SENTINEL NODE BIOPSY  Bilateral 12/26/2015   Procedure: BILATERAL MASTECTOMY WITH LEFT SENTINEL LYMPH NODE BIOPSY;  Surgeon: Autumn Messing III, MD;  Location: [place];  Service: General;  Laterality: Bilateral;  . Removal of IUD    . TUBAL LIGATION Bilateral 1995    FAMILY HISTORY: Family History  Problem Relation Age of Onset  . Diabetes Mother   . Hypertension Mother   . Hypertension Father   . Diabetes Maternal Grandmother   . Hypertension Sister   . Cancer Paternal Grandmother        colon  . Colon cancer Paternal Grandmother   . Esophageal cancer Neg Hx   . Stomach cancer Neg Hx   . Rectal cancer Neg Hx   The patient's parents are still living, in their early 43s as of June 2017. The patient has one brother, 2 sisters. On the maternal side there is a history of uterine and prostate cancer. On the paternal side there is a history of colon cancer and possibly uterine cancer. There is no history of breast or ovarian cancer in the family.    GYNECOLOGIC HISTORY:  Patient's last menstrual period was 12/19/2015.  Menarche age 52. She still having regular periods. She is GX P1, first pregnancy age 16. She used oral contraceptives for a few years remotely, with no complications. She is currently on a Mirena IUD and also is status post bilateral tubal ligation.   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 Her husband [name] is a Administrator.  They are both taking appropriate precautions at work.  Their son [name], age 66, is a radio DJ.  He no longer lives with the patient.  The patient has no grandchildren. She is not a Ambulance person.   ADVANCED DIRECTIVES: In the absence of any documents to the contrary her husband is her healthcare power of attorney   HEALTH MAINTENANCE: Social History   Tobacco Use  . Smoking status: Never Smoker  . Smokeless tobacco: Never Used  Vaping Use  . Vaping Use: Never used  Substance Use Topics  . Alcohol use: No  . Drug  use: No     Colonoscopy: January 2016   PAP: January 2016   Bone density: August 2017 showed a T score of +0.7 normal  Lipid panel:  Allergies  Allergen Reactions  . Ace Inhibitors Cough  . Atorvastatin Other (See Comments)    Joint pain  . Simvastatin Other (See Comments)    Joint pain    Current Outpatient Medications  Medication Sig Dispense Refill  . acetaminophen (TYLENOL) 500 MG tablet Take 1,000 mg by mouth every 6 (six) hours as needed.    . blood glucose meter kit and supplies KIT Dispense based on patient and insurance preference. Use up to four times daily as directed. (FOR ICD-9 250.00, 250.01). 1 each 0  . Calcium-Magnesium 250-125 MG TABS Take 1 tablet by mouth daily. 120 each 4  . cetirizine (ZYRTEC) 10 MG tablet Take 10 mg by mouth daily as needed for allergies.     . chlorhexidine (PERIDEX) 0.12 % solution Use 5 mLs in the mouth or throat 2 (two) times daily as needed as directed. Do not swallow 473 mL 6  . Ferrous Sulfate (IRON) 325 (65 Fe) MG TABS Take 1 tablet (325 mg total) by mouth daily. 30 each 2  . fluconazole (DIFLUCAN) 100 MG tablet TAKE 2 TABLETS BY MOUTH FOR THE 1ST DOSE THEN 1 TABLET DAILY 8 tablet 0  . furosemide (LASIX) 20 MG tablet Take 20 mg by mouth daily as needed.    Marland Kitchen glucose blood test strip USE AS DIRECTED TO TEST UP TO FOUR TIMES DAILY (Patient taking differently: USE AS DIRECTED TO TEST UP TO FOUR TIMES DAILY) 100 strip 0  . ibuprofen (ADVIL) 800 MG tablet Take 1 tablet (800 mg total) by mouth 2 (two) times daily as needed. 30 tablet 1  . Lancets (ONETOUCH ULTRASOFT) lancets Use as instructed 100 each 12  . levothyroxine (SYNTHROID) 137 MCG tablet TAKE 1 TABLET BY MOUTH ON AN EMPTY STOMACH EVERY MORNING DAILY 90 DAYS 90 tablet 3  . lidocaine-prilocaine (EMLA) cream Apply to affected area once 30 g 3  . LORazepam (ATIVAN) 1 MG tablet TAKE 1/2 TO 1 TABLET BY MOUTH AT BEDTIME AS NEEDED 30 tablet 0  . losartan-hydrochlorothiazide (HYZAAR) 100-25  MG tablet TAKE 1 TABLET BY MOUTH ONCE DAILY 90 tablet 1  . Multiple Vitamin (MULTIVITAMIN) tablet Take 1 tablet by mouth daily.    Marland Kitchen omega-3 acid ethyl esters (LOVAZA) 1 g capsule Take 1 g by mouth  2 (two) times daily.     . potassium chloride SA (K-DUR,KLOR-CON) 20 MEQ tablet Take 40 mEq by mouth daily.     . pregabalin (LYRICA) 100 MG capsule TAKE 1 CAPSULE BY MOUTH 3 TIMES A DAY 90 capsule 0  . prochlorperazine (COMPAZINE) 10 MG tablet Take 1 tablet (10 mg total) by mouth every 6 (six) hours as needed (Nausea or vomiting). 30 tablet 1  . rosuvastatin (CRESTOR) 5 MG tablet TAKE 1 TABLET BY MOUTH ONCE DAILY 30 tablet 7  . sertraline (ZOLOFT) 100 MG tablet TAKE 1 TABLET BY MOUTH ONCE A DAY 30 tablet 12  . traMADol (ULTRAM) 50 MG tablet Take 1 tablet (50 mg total) by mouth every 6 (six) hours as needed. 60 tablet 0  . valACYclovir (VALTREX) 1000 MG tablet Take 1 tablet (1,000 mg total) by mouth daily. 60 tablet 6  . Vitamin D, Cholecalciferol, 1000 units CAPS Take 1 tablet by mouth daily. (Patient taking differently: Take 1,000 Units by mouth daily.) 90 capsule 4   No current facility-administered medications for this visit.    OBJECTIVE: African-American woman who appears stated age  61:   10/04/20 1345  BP: (!) 191/104  Pulse: 98  Resp: 20  Temp: (!) 97.2 F (36.2 C)  SpO2: 98%     Body mass index is 46.02 kg/m.    ECOG FS:1 - Symptomatic but completely ambulatory  Filed Weights   10/04/20 1345  Weight: 285 lb 1.6 oz (129.3 kg)    GENERAL: in Obvious pain HEENT:  Sclerae anicteric.  Oropharynx clear and moist. No ulcerations or evidence of oropharyngeal candidiasis. Neck is supple. No swelling or oral lesions that I can tell, she notes tenderness throughout the entire left lower jaw to palpation NODES:  No cervical, supraclavicular, or axillary lymphadenopathy palpated.  BREAST EXAM:  Deferred. LUNGS:  Clear to auscultation bilaterally.  No wheezes or rhonchi. HEART:  Regular  rate and rhythm. No murmur appreciated. ABDOMEN:  Soft, nontender.  Positive, normoactive bowel sounds.  SKIN:  Clear with no obvious rashes or skin changes. No nail dyscrasia. NEURO:  Nonfocal. Well oriented.  Appropriate affect.     LAB RESULTS:  CMP     Component Value Date/Time   NA 140 09/24/2020 0904   NA 140 07/08/2016 0948   K 3.5 09/24/2020 0904   K 4.1 07/08/2016 0948   CL 102 09/24/2020 0904   CO2 28 09/24/2020 0904   CO2 24 07/08/2016 0948   GLUCOSE 91 09/24/2020 0904   GLUCOSE 89 07/08/2016 0948   BUN 13 09/24/2020 0904   BUN 11.9 07/08/2016 0948   CREATININE 0.78 09/24/2020 0904   CREATININE 0.84 08/22/2020 1050   CREATININE 0.9 07/08/2016 0948   CALCIUM 9.1 09/24/2020 0904   CALCIUM 10.0 07/08/2016 0948   PROT 7.0 09/24/2020 0904   PROT 7.6 07/08/2016 0948   ALBUMIN 3.3 (L) 09/24/2020 0904   ALBUMIN 3.6 07/08/2016 0948   AST 16 09/24/2020 0904   AST 16 08/22/2020 1050   AST 22 07/08/2016 0948   ALT 17 09/24/2020 0904   ALT 14 08/22/2020 1050   ALT 27 07/08/2016 0948   ALKPHOS 145 (H) 09/24/2020 0904   ALKPHOS 115 07/08/2016 0948   BILITOT 0.3 09/24/2020 0904   BILITOT 0.3 08/22/2020 1050   BILITOT 0.35 07/08/2016 0948   GFRNONAA >60 09/24/2020 0904   GFRNONAA >60 08/22/2020 1050   GFRAA >60 01/30/2020 0814   GFRAA >60 05/16/2019 0905    INo results found  for: SPEP, UPEP  Lab Results  Component Value Date   WBC 3.3 (L) 09/24/2020   NEUTROABS 2.3 09/24/2020   HGB 11.2 (L) 09/24/2020   HCT 34.5 (L) 09/24/2020   MCV 94.0 09/24/2020   PLT 283 09/24/2020      Chemistry      Component Value Date/Time   NA 140 09/24/2020 0904   NA 140 07/08/2016 0948   K 3.5 09/24/2020 0904   K 4.1 07/08/2016 0948   CL 102 09/24/2020 0904   CO2 28 09/24/2020 0904   CO2 24 07/08/2016 0948   BUN 13 09/24/2020 0904   BUN 11.9 07/08/2016 0948   CREATININE 0.78 09/24/2020 0904   CREATININE 0.84 08/22/2020 1050   CREATININE 0.9 07/08/2016 0948       Component Value Date/Time   CALCIUM 9.1 09/24/2020 0904   CALCIUM 10.0 07/08/2016 0948   ALKPHOS 145 (H) 09/24/2020 0904   ALKPHOS 115 07/08/2016 0948   AST 16 09/24/2020 0904   AST 16 08/22/2020 1050   AST 22 07/08/2016 0948   ALT 17 09/24/2020 0904   ALT 14 08/22/2020 1050   ALT 27 07/08/2016 0948   BILITOT 0.3 09/24/2020 0904   BILITOT 0.3 08/22/2020 1050   BILITOT 0.35 07/08/2016 0948       No results found for: LABCA2  No components found for: HKFEX614  No results for input(s): INR in the last 168 hours.  Urinalysis No results found for: COLORURINE, APPEARANCEUR, LABSPEC, PHURINE, GLUCOSEU, HGBUR, BILIRUBINUR, KETONESUR, PROTEINUR, UROBILINOGEN, NITRITE, LEUKOCYTESUR   STUDIES: IR IMAGING GUIDED PORT INSERTION  Result Date: 09/16/2020 INDICATION: History of recurrent metastatic breast cancer. Please perform image guided placement of port a catheter for durable intravenous access for chemotherapy administration. EXAM: IMPLANTED PORT A CATH PLACEMENT WITH ULTRASOUND AND FLUOROSCOPIC GUIDANCE COMPARISON:  Chest CT-09/03/2020 MEDICATIONS: Zofran 4 mg IV ANESTHESIA/SEDATION: Moderate (conscious) sedation was employed during this procedure. A total of Versed 2 mg and Fentanyl 100 mcg was administered intravenously. Moderate Sedation Time: 23 minutes. The patient's level of consciousness and vital signs were monitored continuously by radiology nursing throughout the procedure under my direct supervision. CONTRAST:  None FLUOROSCOPY TIME:  42 seconds (22 mGy) COMPLICATIONS: None immediate. PROCEDURE: The procedure, risks, benefits, and alternatives were explained to the patient. Questions regarding the procedure were encouraged and answered. The patient understands and consents to the procedure. The right neck and chest were prepped with chlorhexidine in a sterile fashion, and a sterile drape was applied covering the operative field. Maximum barrier sterile technique with sterile gowns  and gloves were used for the procedure. A timeout was performed prior to the initiation of the procedure. Local anesthesia was provided with 1% lidocaine with epinephrine. After creating a small venotomy incision, a micropuncture kit was utilized to access the internal jugular vein. Real-time ultrasound guidance was utilized for vascular access including the acquisition of a permanent ultrasound image documenting patency of the accessed vessel. The microwire was utilized to measure appropriate catheter length. A subcutaneous port pocket was then created along the upper chest wall utilizing a combination of sharp and blunt dissection. The pocket was irrigated with sterile saline. A single lumen "Slim" sized power injectable port was chosen for placement. The 8 Fr catheter was tunneled from the port pocket site to the venotomy incision. The port was placed in the pocket. The external catheter was trimmed to appropriate length. At the venotomy, an 8 Fr peel-away sheath was placed over a guidewire under fluoroscopic guidance. The catheter  was then placed through the sheath and the sheath was removed. Final catheter positioning was confirmed and documented with a fluoroscopic spot radiograph. The port was accessed with a Huber needle, aspirated and flushed with heparinized saline. The venotomy site was closed with an interrupted 4-0 Vicryl suture. The port pocket incision was closed with interrupted 2-0 Vicryl suture. The skin was opposed with a running subcuticular 4-0 Vicryl suture. Dermabond and Steri-strips were applied to both incisions. Dressings were applied. The patient tolerated the procedure well without immediate post procedural complication. FINDINGS: After catheter placement, the tip lies within the superior cavoatrial junction. The catheter aspirates and flushes normally and is ready for immediate use. IMPRESSION: Successful placement of a right internal jugular approach power injectable Port-A-Cath. The  catheter is ready for immediate use. Electronically Signed   By: Sandi Mariscal M.D.   On: 09/16/2020 14:52     ELIGIBLE FOR AVAILABLE RESEARCH PROTOCOL: PALLAS, but inelegible because of Mirena IUD   ASSESSMENT: 55 y.o. [place] woman status post right breast upper inner quadrant biopsy 10/15/2015 for a clinical T2-T3 N0, stage II invasive ductal carcinoma, estrogen and progesterone receptor positive, HER-2 nonamplified, with an Mib-1 of 70%  (1) status post right lumpectomy and sentinel lymph node sampling with oncoplastic breast reduction 11/28/2015 for a pT2 pN1, stage IIB invasive ductal carcinoma, grade 1, with negative margins (1/1 sentinel node positive)  (a) reclassified as stage IIA in the 2018 prognostic revision   (2) left reduction mammoplasty 11/28/2015 unexpectedly found a pT1c pNX invasive ductal carcinoma, grade 1, estrogen and progesterone receptor positive, HER-2 not amplified, with an MIB-1 of 3%  (3) Mammaprint from the right sided breast cancer was "low risk", predicting a 98% chance of no disease recurrence within 5 years with anti-estrogens as the only systemic therapy. It also predicts minimal to no benefit from chemotherapy  (4) status post bilateral mastectomies with bilateral sentinel lymph node sampling 12/26/2015, showing  (a) on the right, no residual carcinoma (0/3 nodes involved)  (b) on the left, focal ductal carcinoma in situ, 0.2 cm, with negative margins; (0/4 nodes involved)  (5) adjuvant radiation 02/11/16 - 03/27/16    1) Right chest wall: 50.4 Gy in 28 fractions               2) Right chest wall boost: 10 Gy in 5 fractions   (6) started on tamoxifen neoadjuvantly 10/23/2015 to allow for expected delays in definitive local treatment  (a) Mirena IUD in place  (b) tamoxifen discontinued 08/13/2017, with progression  (7) since she had 4 lymph nodes removed from each axilla but also receive radiation on the right, lab draws should preferentially be  obtained from the left arm   METASTATIC DISEASE: April 2019 (8) CT scan of the abdomen and pelvis 08/04/2017, MRI of the liver and total spine MRI 08/11/2017 showed liver and bone metastases  (a) liver biopsy 09/07/2017 confirms estrogen receptor positive, progesterone receptor negative, HER-2 negative metastatic breast cancer  (b) chest CT scan 08/13/2017 shows multiple bilateral pulmonary nodules.  (c) CT of the brain with and without contrast 08/26/2017 shows no evidence of metastatic disease to the brain  (d) CA 27-29 is informative  (9) fulvestrant started 08/17/2017  (a) palbociclib started 08/31/2017  (b) fulvestrant and palbociclib discontinued 01/04/2018 with evidence of progression in the liver  (10) denosumab/Xgeva started 09/14/2017  (a) held after 03/01/2018 dose because concern re. osteonecrosis  (b) resumed 06/09/2018  (c) 02/21/2019 dose held due to oral surgery, resumed  03/21/2019  (11) anastrozole started 01/04/2018  (a) everolimus started 01/29/2018  (b) liver MRI and bone scan December 2019 show stable disease  (c) chest CT scan on 08/02/2018 shows lung lesions resolved, bone lesions stable  (d) bone scan 09/13/2018 essentially stable  (e) liver MRI 12/08/2018 shows improvement in hepatic metastsases  (f) liver MRI and bone scan 04/13/2019 showed continuing improvement in the liver, stable bone lesions  (g) chest CT 08/01/2019 stable, no evidence of active disease ows  (12) 01/28/18 prophylactic left femur intramedullary nail surgery for impending left femur pathologic fracture  (a) palliative radiation with Dr. [name] to the left femur and left ilium from 02/16/2018 to 03/01/2018  (13) [facility] report on liver biopsy from April 2019 shows a negative PDL 1, stable MSI and proficient mismatch repair status.  The tumor is positive for the androgen receptor, and for PTEN for PIK3 CA mutations.  HER-2/neu was read as equivocal (it was negative by FISH on the pathology  report).  (14) Progression noted in liver MRI on 01/11/2020; fulvestrant started on 01/30/2020  (a) alpelisib started 01/30/2020 at 300 mg daily with Metformin 500 mg daily  (b) alpelisib held on 02/07/2020 with very high blood sugars.  Metformin increased to 1 g daily  (c) alpelisib resumed at 150 mg daily on 02/13/2020  (d) liver MRI 05/06/2020 shows evidence of disease progression  (e) fulvestrant and alpelisib discontinued January 2022  (15) capecitabine started 05/22/2020 at 1.5 g twice daily, 14/7  (a) abdominal MRI 08/23/2020 shows worsening liver disease  (b) chest CT scan 09/04/2020 shows new lung metastases  (c) capecitabine discontinued April 2022  (16) paclitaxel started 09/10/2020, repeated days 1, 8, and 15 of each 28-day cycle   PLAN: Iver continues on taxol for her metastatic breast cancer.  She is here today for acute and urgent left tooth pain that has been worsening over the past week.  Tramadol is not working.  She will continue the Ibuprofen TID, and will take Oxycodone 5 mg q 4-6 hr prn in between.  We reviewed goals of care in pain management which include maintaining functionality and quality of life.  She understands this.  I reviewed and she verbalized understanding that the oxycodone can lead to somnolence and to avoid taking it with any other medications that can make her feel sleepy.  She understands also that they can be constipating and I recommended miralax and a stool softener as needed.    If her pain is not relieved with the oxycodone, she was recommended to go to the ER for further evaluation and management.     Total encounter time 20 minutes.* in face to face visit time, chart review, and documentation of the encounter.  Wilber Bihari, NP 10/04/20 2:08 PM Medical Oncology and Hematology [facility] [place],  [address] Tel. [phone]    Fax. [phone]   *Total Encounter Time as defined by the Centers for  Medicare and Medicaid Services includes, in addition to the face-to-face time of a patient visit (documented in the note above) non-face-to-face time: obtaining and reviewing outside history, ordering and reviewing medications, tests or procedures, care coordination (communications with other health care professionals or caregivers) and documentation in the medical record.

## 2020-10-05 ENCOUNTER — Encounter: Payer: Self-pay | Admitting: Oncology

## 2020-10-08 ENCOUNTER — Inpatient Hospital Stay: Payer: 59

## 2020-10-08 ENCOUNTER — Other Ambulatory Visit: Payer: Self-pay

## 2020-10-08 ENCOUNTER — Ambulatory Visit: Payer: 59 | Admitting: Oncology

## 2020-10-08 ENCOUNTER — Inpatient Hospital Stay (HOSPITAL_BASED_OUTPATIENT_CLINIC_OR_DEPARTMENT_OTHER): Payer: 59 | Admitting: Physician Assistant

## 2020-10-08 VITALS — BP 169/79 | HR 86 | Temp 98.5°F | Resp 18 | Ht 66.0 in | Wt 289.1 lb

## 2020-10-08 DIAGNOSIS — Z170 Estrogen receptor positive status [ER+]: Secondary | ICD-10-CM

## 2020-10-08 DIAGNOSIS — C50112 Malignant neoplasm of central portion of left female breast: Secondary | ICD-10-CM

## 2020-10-08 DIAGNOSIS — C50211 Malignant neoplasm of upper-inner quadrant of right female breast: Secondary | ICD-10-CM

## 2020-10-08 DIAGNOSIS — M84552A Pathological fracture in neoplastic disease, left femur, initial encounter for fracture: Secondary | ICD-10-CM

## 2020-10-08 DIAGNOSIS — Z5111 Encounter for antineoplastic chemotherapy: Secondary | ICD-10-CM | POA: Diagnosis not present

## 2020-10-08 DIAGNOSIS — Z79899 Other long term (current) drug therapy: Secondary | ICD-10-CM | POA: Diagnosis not present

## 2020-10-08 DIAGNOSIS — Z95828 Presence of other vascular implants and grafts: Secondary | ICD-10-CM

## 2020-10-08 DIAGNOSIS — R600 Localized edema: Secondary | ICD-10-CM | POA: Diagnosis not present

## 2020-10-08 DIAGNOSIS — Z923 Personal history of irradiation: Secondary | ICD-10-CM | POA: Diagnosis not present

## 2020-10-08 DIAGNOSIS — C7951 Secondary malignant neoplasm of bone: Secondary | ICD-10-CM

## 2020-10-08 DIAGNOSIS — C787 Secondary malignant neoplasm of liver and intrahepatic bile duct: Secondary | ICD-10-CM | POA: Diagnosis not present

## 2020-10-08 LAB — CBC WITH DIFFERENTIAL/PLATELET
Abs Immature Granulocytes: 0.06 10*3/uL (ref 0.00–0.07)
Basophils Absolute: 0 10*3/uL (ref 0.0–0.1)
Basophils Relative: 1 %
Eosinophils Absolute: 0.2 10*3/uL (ref 0.0–0.5)
Eosinophils Relative: 3 %
HCT: 33.5 % — ABNORMAL LOW (ref 36.0–46.0)
Hemoglobin: 11.1 g/dL — ABNORMAL LOW (ref 12.0–15.0)
Immature Granulocytes: 1 %
Lymphocytes Relative: 12 %
Lymphs Abs: 0.6 10*3/uL — ABNORMAL LOW (ref 0.7–4.0)
MCH: 30.3 pg (ref 26.0–34.0)
MCHC: 33.1 g/dL (ref 30.0–36.0)
MCV: 91.5 fL (ref 80.0–100.0)
Monocytes Absolute: 0.4 10*3/uL (ref 0.1–1.0)
Monocytes Relative: 9 %
Neutro Abs: 3.6 10*3/uL (ref 1.7–7.7)
Neutrophils Relative %: 74 %
Platelets: 251 10*3/uL (ref 150–400)
RBC: 3.66 MIL/uL — ABNORMAL LOW (ref 3.87–5.11)
RDW: 16.9 % — ABNORMAL HIGH (ref 11.5–15.5)
WBC: 4.8 10*3/uL (ref 4.0–10.5)
nRBC: 0 % (ref 0.0–0.2)

## 2020-10-08 LAB — COMPREHENSIVE METABOLIC PANEL
ALT: 19 U/L (ref 0–44)
AST: 19 U/L (ref 15–41)
Albumin: 3.1 g/dL — ABNORMAL LOW (ref 3.5–5.0)
Alkaline Phosphatase: 141 U/L — ABNORMAL HIGH (ref 38–126)
Anion gap: 12 (ref 5–15)
BUN: 11 mg/dL (ref 6–20)
CO2: 27 mmol/L (ref 22–32)
Calcium: 9.5 mg/dL (ref 8.9–10.3)
Chloride: 105 mmol/L (ref 98–111)
Creatinine, Ser: 0.76 mg/dL (ref 0.44–1.00)
GFR, Estimated: >60 mL/min (ref >60)
Glucose, Bld: 94 mg/dL (ref 70–99)
Potassium: 3.8 mmol/L (ref 3.5–5.1)
Sodium: 144 mmol/L (ref 135–145)
Total Bilirubin: 0.3 mg/dL (ref 0.3–1.2)
Total Protein: 6.5 g/dL (ref 6.5–8.1)

## 2020-10-08 MED ORDER — FAMOTIDINE 20 MG IN NS 100 ML IVPB
INTRAVENOUS | Status: AC
  Filled 2020-10-08: qty 100

## 2020-10-08 MED ORDER — SODIUM CHLORIDE 0.9 % IV SOLN
Freq: Once | INTRAVENOUS | Status: AC
Start: 2020-10-08 — End: 2020-10-08
  Filled 2020-10-08: qty 250

## 2020-10-08 MED ORDER — SODIUM CHLORIDE 0.9% FLUSH
10.0000 mL | INTRAVENOUS | Status: DC | PRN
  Administered 2020-10-08: 10 mL
  Filled 2020-10-08: qty 10

## 2020-10-08 MED ORDER — FAMOTIDINE 20 MG IN NS 100 ML IVPB
20.0000 mg | Freq: Once | INTRAVENOUS | Status: AC
  Administered 2020-10-08: 20 mg via INTRAVENOUS

## 2020-10-08 MED ORDER — DEXAMETHASONE SODIUM PHOSPHATE 100 MG/10ML IJ SOLN
10.0000 mg | Freq: Once | INTRAMUSCULAR | Status: AC
  Administered 2020-10-08: 10 mg via INTRAVENOUS
  Filled 2020-10-08: qty 10

## 2020-10-08 MED ORDER — DIPHENHYDRAMINE HCL 50 MG/ML IJ SOLN
25.0000 mg | Freq: Once | INTRAMUSCULAR | Status: AC
  Administered 2020-10-08: 25 mg via INTRAVENOUS

## 2020-10-08 MED ORDER — SODIUM CHLORIDE 0.9 % IV SOLN
80.0000 mg/m2 | Freq: Once | INTRAVENOUS | Status: AC
  Administered 2020-10-08: 192 mg via INTRAVENOUS
  Filled 2020-10-08: qty 32

## 2020-10-08 MED ORDER — DIPHENHYDRAMINE HCL 50 MG/ML IJ SOLN
INTRAMUSCULAR | Status: AC
  Filled 2020-10-08: qty 1

## 2020-10-08 MED ORDER — SODIUM CHLORIDE 0.9% FLUSH
10.0000 mL | INTRAVENOUS | Status: AC | PRN
  Administered 2020-10-08: 10 mL
  Filled 2020-10-08: qty 10

## 2020-10-08 MED ORDER — HEPARIN SOD (PORK) LOCK FLUSH 100 UNIT/ML IV SOLN
500.0000 [IU] | Freq: Once | INTRAVENOUS | Status: AC | PRN
  Administered 2020-10-08: 500 [IU]
  Filled 2020-10-08: qty 5

## 2020-10-08 NOTE — Progress Notes (Signed)
[place]  [phone] [phone]    ID: Debbie [name] DOB: Jan 24, 1966  MR#: [id number]  TGG#:[id number]  Patient Care Team: Kelton Pillar, MD as PCP - General (Family Medicine) Jacelyn Pi, MD as Consulting Physician (Endocrinology) Jovita Kussmaul, MD as Consulting Physician (General Surgery) [name], Virgie Dad, MD as Consulting Physician (Oncology) [name], MD as Attending Physician (Radiation Oncology) Belva Crome, MD as Consulting Physician (Cardiology) Crissie Reese, MD as Consulting Physician (Plastic Surgery) [name], [name], NP as Nurse Practitioner (Hematology and Oncology) Lajoyce Lauber, DMD (Dentistry) OTHER MD:   CHIEF COMPLAINT: Estrogen receptor positive stage IV  breast cancer (s/p bilateral mastectomies)  CURRENT TREATMENT: paclitaxel; denosumab/Xgeva  -She receives Taxol on days 1, 8 and 15 of each 28-day cycle. Today is C2D1.   -She receives [place] every 4 weeks, with her most recent dose on 09/17/2020.  Next dose due on 10/15/2020.    INTERVAL HISTORY: Debbie [name] returns today for C2D1 of Taxol. Patient is unaccompanied for this visit. Overall, she is doing well and continues to tolerate chemotherapy without any significant limitations. She continues to work and is able to complete all her ADLs on her own. She has noticed increased appetite and weight gain. She denies any nausea, vomiting or abdominal pain. Her bowel movements are regular without any constipation or diarrhea. She denies easy bruising or signs of bleeding. Patient reports improvement of left jaw pain since initiating antibiotics last Thursday. She takes ibuprofen and oxycodone as needed for pain control. Patient reports stable lower extremity edema. She denies any fevers, chills, night sweats, chest pain or cough. She has no other complaints.   Lab Results  Component Value Date   CA2729 222.0 (H) 09/24/2020   CA2729 306.3  (H) 09/17/2020   CA2729 316.9 (H) 09/10/2020   CA2729 177.3 (H) 08/22/2020   CA2729 117.5 (H) 07/18/2020    REVIEW OF SYSTEMS: Review of Systems  Constitutional: Negative for chills, fever and weight loss.  HENT: Negative for congestion and nosebleeds.   Eyes: Negative for blurred vision and double vision.  Respiratory: Positive for shortness of breath (stable and only with exertion). Negative for cough.   Cardiovascular: Positive for leg swelling. Negative for chest pain and palpitations.  Gastrointestinal: Negative for abdominal pain, blood in stool, constipation, diarrhea, melena, nausea and vomiting.  Genitourinary: Negative for dysuria.  Musculoskeletal: Negative for back pain and neck pain.  Skin: Positive for itching (due to dry skin). Negative for rash.  Neurological: Negative for dizziness, weakness and headaches.  Endo/Heme/Allergies: Does not bruise/bleed easily.  Psychiatric/Behavioral: Negative.       COVID 19 VACCINATION STATUS: Status post Pfizer x2, with booster January 2021   BREAST CANCER HISTORY: From the original intake note:  Zarielle had screening mammography with tomosynthesis at [facility] 10/02/2015. This showed 2 possible masses in the right breast as well as some suspicious calcifications. The left breast was unremarkable. On 10/15/2015 she underwent right diagnostic mammography with tomography and right breast ultrasonography. The breast density was category B. Spot magnification views confirmed an area of pleomorphic calcifications measuring up to 5 cm in the upper half of the breast. Also in the upper outer right breast there were adjacent low-density masses without distortion or calcifications. On physical exam there was no palpable abnormality.  Targeted ultrasound of the right breast found the 2 "masses" in the right upper quadrant were benign cysts measuring 0.8 and 0.5 cm respectively. Biopsy of the area of  calcification on 10/15/2015 showed  ([id number]) invasive ductal carcinoma, E-cadherin positive, grade 2, estrogen receptor 90% positive, progesterone receptor 50% positive, both with strong staining intensity, with an MIB-1 of 70%, and no HER-2 amplification, the signals ratio being 1.18 and the number per cell 2.60.  Her subsequent history is as detailed below    PAST MEDICAL HISTORY: Past Medical History:  Diagnosis Date  . Anxiety   . Arthritis   . Bone metastasis ([place])   . Breast cancer of upper-inner quadrant of right female breast [facility]) oncologist-  dr [name]--- 04/ 2019 ER/PR+ Stage IV w/ metastatic disease to liver, total spine, lung, bone   dx 06/ 2017  right breast invasive ductal carcinoma, Stage IIB, ER/PR + (pT2 pN1), grade 1-- s/p  right breast lumpecotmy w/ sln bx's and bilateral breast reduction 11-28-2015/  left breast with reduction , dx invasive ductal ca, Grade 1 (pT1cpNX) ER/PR positive/  adjuvant radiation completed 03-27-2016 to right chest wall, 2018  reclassifiec Stage IIA  . Cancer, metastatic to liver ([place])   . Chronic back pain   . Constipation   . Depression   . Family history of adverse reaction to anesthesia     mother has Copd was kept on vent   . Headache   . Heart murmur   . History of hyperthyroidism    s/p  RAI  . History of radiation therapy 02/11/16- 03/27/16   Right Chest Wall 50.4 Gy in 28 fractions, Right Chest Wall Boost 10 Gy in 5 fractions.   . Hyperlipidemia   . Hypertension   . Hypothyroidism, postradioiodine therapy    endocrinoloigst-  dr balan--  2004  s/p  RAI  . Menorrhagia 2011  . Multiple pulmonary nodules    secondary  metastatic  . PONV (postoperative nausea and vomiting)    patch helps   . SUI (stress urinary incontinence, female)   . Wears contact lenses   . Wears dentures    full upper and lower partial    PAST SURGICAL HISTORY: Past Surgical History:  Procedure Laterality Date  . BREAST LUMPECTOMY WITH NEEDLE LOCALIZATION AND AXILLARY SENTINEL  LYMPH NODE BX Right 11/28/2015   Procedure: RIGHT BREAST LUMPECTOMY WITH NEEDLE LOCALIZATION AND AXILLARY SENTINEL LYMPH NODE BX;  Surgeon: Autumn Messing III, MD;  Location: [place];  Service: General;  Laterality: Right;  . BREAST REDUCTION SURGERY Bilateral 11/28/2015   Procedure: MAMMARY REDUCTION  (BREAST) BILATERAL;  Surgeon: Crissie Reese, MD;  Location: [place];  Service: Plastics;  Laterality: Bilateral;  . CESAREAN SECTION  01/12/1993  . COLONOSCOPY    . Dental surgeries    . FEMUR IM NAIL Left 01/28/2018   Procedure: INTRAMEDULLARY (IM) NAIL FEMORAL;  Surgeon: Nicholes Stairs, MD;  Location: [place];  Service: Orthopedics;  Laterality: Left;  . HYSTEROSCOPY N/A 09/02/2017   Procedure: HYSTEROSCOPY  to remove IUD;  Surgeon: Eldred Manges, MD;  Location: [facility];  Service: Gynecology;  Laterality: N/A;  . IR IMAGING GUIDED PORT INSERTION  09/16/2020  . Liver biospy    . MASTECTOMY    . MASTECTOMY W/ SENTINEL NODE BIOPSY Bilateral 12/26/2015   Procedure: BILATERAL MASTECTOMY WITH LEFT SENTINEL LYMPH NODE BIOPSY;  Surgeon: Autumn Messing III, MD;  Location: [place];  Service: General;  Laterality: Bilateral;  . Removal of IUD    . TUBAL LIGATION Bilateral 1995    FAMILY HISTORY: Family History  Problem Relation Age of Onset  . Diabetes Mother   .  Hypertension Mother   . Hypertension Father   . Diabetes Maternal Grandmother   . Hypertension Sister   . Cancer Paternal Grandmother        colon  . Colon cancer Paternal Grandmother   . Esophageal cancer Neg Hx   . Stomach cancer Neg Hx   . Rectal cancer Neg Hx   The patient's parents are still living, in their early 43s as of June 2017. The patient has one brother, 2 sisters. On the maternal side there is a history of uterine and prostate cancer. On the paternal side there is a history of colon cancer and possibly uterine cancer. There is no history of breast or ovarian cancer in the family.    GYNECOLOGIC HISTORY:   Patient's last menstrual period was 12/19/2015.  Menarche age 15. She still having regular periods. She is GX P1, first pregnancy age 9. She used oral contraceptives for a few years remotely, with no complications. She is currently on a Mirena IUD and also is status post bilateral tubal ligation.   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 Her husband [name] is a Administrator.  They are both taking appropriate precautions at work.  Their son [name], age 28, is a radio DJ.  He no longer lives with the patient.  The patient has no grandchildren. She is not a Ambulance person.   ADVANCED DIRECTIVES: In the absence of any documents to the contrary her husband is her healthcare power of attorney   HEALTH MAINTENANCE: Social History   Tobacco Use  . Smoking status: Never Smoker  . Smokeless tobacco: Never Used  Vaping Use  . Vaping Use: Never used  Substance Use Topics  . Alcohol use: No  . Drug use: No     Colonoscopy: January 2016   PAP: January 2016   Bone density: August 2017 showed a T score of +0.7 normal  Lipid panel:  Allergies  Allergen Reactions  . Ace Inhibitors Cough  . Atorvastatin Other (See Comments)    Joint pain  . Simvastatin Other (See Comments)    Joint pain    Current Outpatient Medications  Medication Sig Dispense Refill  . acetaminophen (TYLENOL) 500 MG tablet Take 1,000 mg by mouth every 6 (six) hours as needed.    Marland Kitchen amoxicillin-clavulanate (AUGMENTIN) 875-125 MG tablet Take 1 tablet by mouth 2 (two) times daily. 20 tablet 0  . blood glucose meter kit and supplies KIT Dispense based on patient and insurance preference. Use up to four times daily as directed. (FOR ICD-9 250.00, 250.01). 1 each 0  . Calcium-Magnesium 250-125 MG TABS Take 1 tablet by mouth daily. 120 each 4  . cetirizine (ZYRTEC) 10 MG tablet Take 10 mg by mouth daily as needed for allergies.     . chlorhexidine (PERIDEX) 0.12 % solution  Use 5 mLs in the mouth or throat 2 (two) times daily as needed as directed. Do not swallow 473 mL 6  . Ferrous Sulfate (IRON) 325 (65 Fe) MG TABS Take 1 tablet (325 mg total) by mouth daily. 30 each 2  . fluconazole (DIFLUCAN) 100 MG tablet TAKE 2 TABLETS BY MOUTH FOR THE 1ST DOSE THEN 1 TABLET DAILY 8 tablet 0  . furosemide (LASIX) 20 MG tablet Take 20 mg by mouth daily as needed.    Marland Kitchen glucose blood test strip USE AS DIRECTED TO TEST UP TO FOUR TIMES DAILY (Patient taking differently: USE AS DIRECTED TO TEST UP  TO FOUR TIMES DAILY) 100 strip 0  . ibuprofen (ADVIL) 800 MG tablet Take 1 tablet (800 mg total) by mouth 2 (two) times daily as needed. 30 tablet 1  . Lancets (ONETOUCH ULTRASOFT) lancets Use as instructed 100 each 12  . levothyroxine (SYNTHROID) 137 MCG tablet TAKE 1 TABLET BY MOUTH ON AN EMPTY STOMACH EVERY MORNING DAILY 90 DAYS 90 tablet 3  . lidocaine-prilocaine (EMLA) cream Apply to affected area once 30 g 3  . LORazepam (ATIVAN) 1 MG tablet TAKE 1/2 TO 1 TABLET BY MOUTH AT BEDTIME AS NEEDED 30 tablet 0  . losartan-hydrochlorothiazide (HYZAAR) 100-25 MG tablet TAKE 1 TABLET BY MOUTH ONCE DAILY 90 tablet 1  . Multiple Vitamin (MULTIVITAMIN) tablet Take 1 tablet by mouth daily.    Marland Kitchen omega-3 acid ethyl esters (LOVAZA) 1 g capsule Take 1 g by mouth 2 (two) times daily.     Marland Kitchen oxyCODONE (OXY IR/ROXICODONE) 5 MG immediate release tablet Take 1 tablet (5 mg total) by mouth every 4 (four) hours as needed for severe pain. 60 tablet 0  . potassium chloride SA (K-DUR,KLOR-CON) 20 MEQ tablet Take 40 mEq by mouth daily.     . pregabalin (LYRICA) 100 MG capsule TAKE 1 CAPSULE BY MOUTH 3 TIMES A DAY 90 capsule 0  . prochlorperazine (COMPAZINE) 10 MG tablet Take 1 tablet (10 mg total) by mouth every 6 (six) hours as needed (Nausea or vomiting). 30 tablet 1  . rosuvastatin (CRESTOR) 5 MG tablet TAKE 1 TABLET BY MOUTH ONCE DAILY 30 tablet 7  . sertraline (ZOLOFT) 100 MG tablet TAKE 1 TABLET BY MOUTH  ONCE A DAY 30 tablet 12  . valACYclovir (VALTREX) 1000 MG tablet Take 1 tablet (1,000 mg total) by mouth daily. 60 tablet 6  . Vitamin D, Cholecalciferol, 1000 units CAPS Take 1 tablet by mouth daily. (Patient taking differently: Take 1,000 Units by mouth daily.) 90 capsule 4   No current facility-administered medications for this visit.   Facility-Administered Medications Ordered in Other Visits  Medication Dose Route Frequency Provider Last Rate Last Admin  . 0.9 %  sodium chloride infusion   Intravenous Once [name], Virgie Dad, MD      . dexamethasone (DECADRON) 10 mg in sodium chloride 0.9 % 50 mL IVPB  10 mg Intravenous Once [name], Virgie Dad, MD      . diphenhydrAMINE (BENADRYL) injection 25 mg  25 mg Intravenous Once [name], Virgie Dad, MD      . famotidine (PEPCID) IVPB 20 mg in NS 100 mL IVPB  20 mg Intravenous Once [name], Virgie Dad, MD      . heparin lock flush 100 unit/mL  500 Units Intracatheter Once PRN [name], Virgie Dad, MD      . PACLitaxel (TAXOL) 192 mg in sodium chloride 0.9 % 250 mL chemo infusion (</= $RemoveBefor'80mg'wDbhmsInKsRI$ /m2)  80 mg/m2 (Treatment Plan Recorded) Intravenous Once [name], Virgie Dad, MD      . sodium chloride flush (NS) 0.9 % injection 10 mL  10 mL Intracatheter PRN [name], Virgie Dad, MD        OBJECTIVE: African-American woman who appears stated age  Vitals:   10/08/20 0809  BP: (!) 169/79  Pulse: 86  Resp: 18  Temp: 98.5 F (36.9 C)  SpO2: 97%     Body mass index is 46.66 kg/m.    ECOG FS:1 - Symptomatic but completely ambulatory  Filed Weights   10/08/20 0809  Weight: 289 lb 1.6 oz (131.1 kg)    GENERAL:  in Obvious pain HEENT:  Sclerae anicteric.  Oropharynx clear and moist. No ulcerations or evidence of oropharyngeal candidiasis. Neck is supple. No swelling or oral lesions. NODES:  No cervical, supraclavicular, or axillary lymphadenopathy palpated.  BREAST EXAM:  Deferred. LUNGS:  Clear to auscultation bilaterally.  No wheezes or rhonchi. HEART:   Regular rate and rhythm. No murmur appreciated. ABDOMEN:  Soft, nontender.  Positive, normoactive bowel sounds.  SKIN:  Clear with no obvious rashes or skin changes. No nail dyscrasia. NEURO:  Nonfocal. Well oriented.  Appropriate affect. EXT: Bilateral lower extremity edema, non tender or evidence of erythema.      LAB RESULTS:  CMP     Component Value Date/Time   NA 144 10/08/2020 0744   NA 140 07/08/2016 0948   K 3.8 10/08/2020 0744   K 4.1 07/08/2016 0948   CL 105 10/08/2020 0744   CO2 27 10/08/2020 0744   CO2 24 07/08/2016 0948   GLUCOSE 94 10/08/2020 0744   GLUCOSE 89 07/08/2016 0948   BUN 11 10/08/2020 0744   BUN 11.9 07/08/2016 0948   CREATININE 0.76 10/08/2020 0744   CREATININE 0.84 08/22/2020 1050   CREATININE 0.9 07/08/2016 0948   CALCIUM 9.5 10/08/2020 0744   CALCIUM 10.0 07/08/2016 0948   PROT 6.5 10/08/2020 0744   PROT 7.6 07/08/2016 0948   ALBUMIN 3.1 (L) 10/08/2020 0744   ALBUMIN 3.6 07/08/2016 0948   AST 19 10/08/2020 0744   AST 16 08/22/2020 1050   AST 22 07/08/2016 0948   ALT 19 10/08/2020 0744   ALT 14 08/22/2020 1050   ALT 27 07/08/2016 0948   ALKPHOS 141 (H) 10/08/2020 0744   ALKPHOS 115 07/08/2016 0948   BILITOT 0.3 10/08/2020 0744   BILITOT 0.3 08/22/2020 1050   BILITOT 0.35 07/08/2016 0948   GFRNONAA >60 10/08/2020 0744   GFRNONAA >60 08/22/2020 1050   GFRAA >60 01/30/2020 0814   GFRAA >60 05/16/2019 0905    INo results found for: SPEP, UPEP  Lab Results  Component Value Date   WBC 4.8 10/08/2020   NEUTROABS 3.6 10/08/2020   HGB 11.1 (L) 10/08/2020   HCT 33.5 (L) 10/08/2020   MCV 91.5 10/08/2020   PLT 251 10/08/2020      Chemistry      Component Value Date/Time   NA 144 10/08/2020 0744   NA 140 07/08/2016 0948   K 3.8 10/08/2020 0744   K 4.1 07/08/2016 0948   CL 105 10/08/2020 0744   CO2 27 10/08/2020 0744   CO2 24 07/08/2016 0948   BUN 11 10/08/2020 0744   BUN 11.9 07/08/2016 0948   CREATININE 0.76 10/08/2020 0744    CREATININE 0.84 08/22/2020 1050   CREATININE 0.9 07/08/2016 0948      Component Value Date/Time   CALCIUM 9.5 10/08/2020 0744   CALCIUM 10.0 07/08/2016 0948   ALKPHOS 141 (H) 10/08/2020 0744   ALKPHOS 115 07/08/2016 0948   AST 19 10/08/2020 0744   AST 16 08/22/2020 1050   AST 22 07/08/2016 0948   ALT 19 10/08/2020 0744   ALT 14 08/22/2020 1050   ALT 27 07/08/2016 0948   BILITOT 0.3 10/08/2020 0744   BILITOT 0.3 08/22/2020 1050   BILITOT 0.35 07/08/2016 0948       No results found for: LABCA2  No components found for: LABCA125  No results for input(s): INR in the last 168 hours.  Urinalysis No results found for: COLORURINE, APPEARANCEUR, [place], [place], [place], [place], [place], [place], [place], [place], NITRITE, LEUKOCYTESUR  STUDIES: IR IMAGING GUIDED PORT INSERTION  Result Date: 09/16/2020 INDICATION: History of recurrent metastatic breast cancer. Please perform image guided placement of port a catheter for durable intravenous access for chemotherapy administration. EXAM: IMPLANTED PORT A CATH PLACEMENT WITH ULTRASOUND AND FLUOROSCOPIC GUIDANCE COMPARISON:  Chest CT-09/03/2020 MEDICATIONS: Zofran 4 mg IV ANESTHESIA/SEDATION: Moderate (conscious) sedation was employed during this procedure. A total of Versed 2 mg and Fentanyl 100 mcg was administered intravenously. Moderate Sedation Time: 23 minutes. The patient's level of consciousness and vital signs were monitored continuously by radiology nursing throughout the procedure under my direct supervision. CONTRAST:  None FLUOROSCOPY TIME:  42 seconds (22 mGy) COMPLICATIONS: None immediate. PROCEDURE: The procedure, risks, benefits, and alternatives were explained to the patient. Questions regarding the procedure were encouraged and answered. The patient understands and consents to the procedure. The right neck and chest were prepped with chlorhexidine in a sterile fashion, and a sterile drape was applied covering the  operative field. Maximum barrier sterile technique with sterile gowns and gloves were used for the procedure. A timeout was performed prior to the initiation of the procedure. Local anesthesia was provided with 1% lidocaine with epinephrine. After creating a small venotomy incision, a micropuncture kit was utilized to access the internal jugular vein. Real-time ultrasound guidance was utilized for vascular access including the acquisition of a permanent ultrasound image documenting patency of the accessed vessel. The microwire was utilized to measure appropriate catheter length. A subcutaneous port pocket was then created along the upper chest wall utilizing a combination of sharp and blunt dissection. The pocket was irrigated with sterile saline. A single lumen "Slim" sized power injectable port was chosen for placement. The 8 Fr catheter was tunneled from the port pocket site to the venotomy incision. The port was placed in the pocket. The external catheter was trimmed to appropriate length. At the venotomy, an 8 Fr peel-away sheath was placed over a guidewire under fluoroscopic guidance. The catheter was then placed through the sheath and the sheath was removed. Final catheter positioning was confirmed and documented with a fluoroscopic spot radiograph. The port was accessed with a Huber needle, aspirated and flushed with heparinized saline. The venotomy site was closed with an interrupted 4-0 Vicryl suture. The port pocket incision was closed with interrupted 2-0 Vicryl suture. The skin was opposed with a running subcuticular 4-0 Vicryl suture. Dermabond and Steri-strips were applied to both incisions. Dressings were applied. The patient tolerated the procedure well without immediate post procedural complication. FINDINGS: After catheter placement, the tip lies within the superior cavoatrial junction. The catheter aspirates and flushes normally and is ready for immediate use. IMPRESSION: Successful placement of  a right internal jugular approach power injectable Port-A-Cath. The catheter is ready for immediate use. Electronically Signed   By: Sandi Mariscal M.D.   On: 09/16/2020 14:52     ELIGIBLE FOR AVAILABLE RESEARCH PROTOCOL: P[place]AS, but inelegible because of Mirena IUD   ASSESSMENT: 55 y.o. [place] woman status post right breast upper inner quadrant biopsy 10/15/2015 for a clinical T2-T3 N0, stage II invasive ductal carcinoma, estrogen and progesterone receptor positive, HER-2 nonamplified, with an Mib-1 of 70%  (1) status post right lumpectomy and sentinel lymph node sampling with oncoplastic breast reduction 11/28/2015 for a pT2 pN1, stage IIB invasive ductal carcinoma, grade 1, with negative margins (1/1 sentinel node positive)  (a) reclassified as stage IIA in the 2018 prognostic revision   (2) left reduction mammoplasty 11/28/2015 unexpectedly found a pT1c pNX invasive ductal carcinoma, grade  1, estrogen and progesterone receptor positive, HER-2 not amplified, with an MIB-1 of 3%  (3) Mammaprint from the right sided breast cancer was "low risk", predicting a 98% chance of no disease recurrence within 5 years with anti-estrogens as the only systemic therapy. It also predicts minimal to no benefit from chemotherapy  (4) status post bilateral mastectomies with bilateral sentinel lymph node sampling 12/26/2015, showing  (a) on the right, no residual carcinoma (0/3 nodes involved)  (b) on the left, focal ductal carcinoma in situ, 0.2 cm, with negative margins; (0/4 nodes involved)  (5) adjuvant radiation 02/11/16 - 03/27/16    1) Right chest wall: 50.4 Gy in 28 fractions               2) Right chest wall boost: 10 Gy in 5 fractions   (6) started on tamoxifen neoadjuvantly 10/23/2015 to allow for expected delays in definitive local treatment  (a) Mirena IUD in place  (b) tamoxifen discontinued 08/13/2017, with progression  (7) since she had 4 lymph nodes removed from each axilla but also  receive radiation on the right, lab draws should preferentially be obtained from the left arm   METASTATIC DISEASE: April 2019 (8) CT scan of the abdomen and pelvis 08/04/2017, MRI of the liver and total spine MRI 08/11/2017 showed liver and bone metastases  (a) liver biopsy 09/07/2017 confirms estrogen receptor positive, progesterone receptor negative, HER-2 negative metastatic breast cancer  (b) chest CT scan 08/13/2017 shows multiple bilateral pulmonary nodules.  (c) CT of the brain with and without contrast 08/26/2017 shows no evidence of metastatic disease to the brain  (d) CA 27-29 is informative  (9) fulvestrant started 08/17/2017  (a) palbociclib started 08/31/2017  (b) fulvestrant and palbociclib discontinued 01/04/2018 with evidence of progression in the liver  (10) denosumab/Xgeva started 09/14/2017  (a) held after 03/01/2018 dose because concern re. osteonecrosis  (b) resumed 06/09/2018  (c) 02/21/2019 dose held due to oral surgery, resumed 03/21/2019  (11) anastrozole started 01/04/2018  (a) everolimus started 01/29/2018  (b) liver MRI and bone scan December 2019 show stable disease  (c) chest CT scan on 08/02/2018 shows lung lesions resolved, bone lesions stable  (d) bone scan 09/13/2018 essentially stable  (e) liver MRI 12/08/2018 shows improvement in hepatic metastsases  (f) liver MRI and bone scan 04/13/2019 showed continuing improvement in the liver, stable bone lesions  (g) chest CT 08/01/2019 stable, no evidence of active disease ows  (12) 01/28/18 prophylactic left femur intramedullary nail surgery for impending left femur pathologic fracture  (a) palliative radiation with Dr. [name] to the left femur and left ilium from 02/16/2018 to 03/01/2018  (13) [facility] report on liver biopsy from April 2019 shows a negative PDL 1, stable MSI and proficient mismatch repair status.  The tumor is positive for the androgen receptor, and for PTEN for PIK3 CA mutations.  HER-2/neu was  read as equivocal (it was negative by FISH on the pathology report).  (14) Progression noted in liver MRI on 01/11/2020; fulvestrant started on 01/30/2020  (a) alpelisib started 01/30/2020 at 300 mg daily with Metformin 500 mg daily  (b) alpelisib held on 02/07/2020 with very high blood sugars.  Metformin increased to 1 g daily  (c) alpelisib resumed at 150 mg daily on 02/13/2020  (d) liver MRI 05/06/2020 shows evidence of disease progression  (e) fulvestrant and alpelisib discontinued January 2022  (15) capecitabine started 05/22/2020 at 1.5 g twice daily, 14/7  (a) abdominal MRI 08/23/2020 shows worsening liver disease  (b)  chest CT scan 09/04/2020 shows new lung metastases  (c) capecitabine discontinued April 2022  (16) paclitaxel started 09/10/2020, repeated days 1, 8, and 15 of each 28-day cycle   PLAN: Sheleen continues on weekly taxol for her metastatic breast cancer.  She is here for C2D1 of taxol. Labs from today were reviewed without any intervention needed. Anemia is mild and stable with hemoglobin of 11.1. Patient will proceed with treatment as planned today. She has treatment scheduled for 10/15/2020 and 10/22/2020. Patient will return to see Dr. [name] on 11/05/2020 prior to C3D1.  Patient notes that jaw pain has greatly improved since starting Augmentin BID last Thursday, 10/03/2020. She will be due to finish course of antibiotic on Sunday, 10/13/2020. Patient is aware to follow up with the clinic if her jaw pain worsens.   In regards to her lower extremity edema, I advised patient to wear compression stocking and elevate legs.   Patient expressed understanding and satisfaction with the plan provided.    I have spent a total of 30 minutes minutes of face-to-face and non-face-to-face time, preparing to see the patient, obtaining and/or reviewing separately obtained history, performing a medically appropriate examination, counseling and educating the patient, documenting clinical  information in the electronic health record, and care coordination.   Dede Query PA-C 10/08/20 8:56 AM [facility] [place], [place] [address]

## 2020-10-08 NOTE — Patient Instructions (Signed)
[facility]  Discharge Instructions: Thank you for choosing [facility] to provide your oncology and hematology care.   If you have a lab appointment with the Cancer Center, please go directly to the Cancer Center and check in at the registration area.   Wear comfortable clothing and clothing appropriate for easy access to any Portacath or PICC line.   We strive to give you quality time with your provider. You may need to reschedule your appointment if you arrive late (15 or more minutes).  Arriving late affects you and other patients whose appointments are after yours.  Also, if you miss three or more appointments without notifying the office, you may be dismissed from the clinic at the provider's discretion.      For prescription refill requests, have your pharmacy contact our office and allow 72 hours for refills to be completed.    Today you received the following chemotherapy and/or immunotherapy agent: Paclitaxel (Taxol)   To help prevent nausea and vomiting after your treatment, we encourage you to take your nausea medication as directed.  BELOW ARE SYMPTOMS THAT SHOULD BE REPORTED IMMEDIATELY: *FEVER GREATER THAN 100.4 F (38 C) OR HIGHER *CHILLS OR SWEATING *NAUSEA AND VOMITING THAT IS NOT CONTROLLED WITH YOUR NAUSEA MEDICATION *UNUSUAL SHORTNESS OF BREATH *UNUSUAL BRUISING OR BLEEDING *URINARY PROBLEMS (pain or burning when urinating, or frequent urination) *BOWEL PROBLEMS (unusual diarrhea, constipation, pain near the anus) TENDERNESS IN MOUTH AND THROAT WITH OR WITHOUT PRESENCE OF ULCERS (sore throat, sores in mouth, or a toothache) UNUSUAL RASH, SWELLING OR PAIN  UNUSUAL VAGINAL DISCHARGE OR ITCHING   Items with * indicate a potential emergency and should be followed up as soon as possible or go to the Emergency Department if any problems should occur.  Please show the CHEMOTHERAPY ALERT CARD or IMMUNOTHERAPY ALERT CARD at  check-in to the Emergency Department and triage nurse.  Should you have questions after your visit or need to cancel or reschedule your appointment, please contact [facility]  Dept: [phone]  and follow the prompts.  Office hours are 8:00 a.m. to 4:30 p.m. Monday - Friday. Please note that voicemails left after 4:00 p.m. may not be returned until the following business day.  We are closed weekends and major holidays. You have access to a nurse at all times for urgent questions. Please call the main number to the clinic Dept: [phone] and follow the prompts.   For any non-urgent questions, you may also contact your provider using MyChart. We now offer e-Visits for anyone 18 and older to request care online for non-urgent symptoms. For details visit [email or web address].   Also download the MyChart app! Go to the app store, search "MyChart", open the app, select [facility], and log in with your MyChart username and password.  Due to Covid, a mask is required upon entering the hospital/clinic. If you do not have a mask, one will be given to you upon arrival. For doctor visits, patients may have 1 support person aged 18 or older with them. For treatment visits, patients cannot have anyone with them due to current Covid guidelines and our immunocompromised population.  

## 2020-10-09 ENCOUNTER — Other Ambulatory Visit: Payer: Self-pay | Admitting: Oncology

## 2020-10-09 ENCOUNTER — Other Ambulatory Visit (HOSPITAL_COMMUNITY): Payer: Self-pay

## 2020-10-09 LAB — CANCER ANTIGEN 27.29: CA 27.29: 150.3 U/mL — ABNORMAL HIGH (ref 0.0–38.6)

## 2020-10-09 MED ORDER — PREGABALIN 100 MG PO CAPS
100.0000 mg | ORAL_CAPSULE | Freq: Three times a day (TID) | ORAL | 0 refills | Status: DC
  Filled 2020-10-09: qty 90, 30d supply, fill #0

## 2020-10-09 MED FILL — Sertraline HCl Tab 100 MG: ORAL | 30 days supply | Qty: 30 | Fill #1 | Status: AC

## 2020-10-10 ENCOUNTER — Other Ambulatory Visit (HOSPITAL_COMMUNITY): Payer: Self-pay

## 2020-10-15 ENCOUNTER — Inpatient Hospital Stay: Payer: 59

## 2020-10-15 ENCOUNTER — Inpatient Hospital Stay: Payer: 59 | Attending: Oncology

## 2020-10-15 ENCOUNTER — Other Ambulatory Visit: Payer: Self-pay

## 2020-10-15 VITALS — BP 148/75 | HR 75 | Temp 98.2°F | Resp 18

## 2020-10-15 DIAGNOSIS — Z923 Personal history of irradiation: Secondary | ICD-10-CM | POA: Diagnosis not present

## 2020-10-15 DIAGNOSIS — C7951 Secondary malignant neoplasm of bone: Secondary | ICD-10-CM | POA: Diagnosis not present

## 2020-10-15 DIAGNOSIS — Z170 Estrogen receptor positive status [ER+]: Secondary | ICD-10-CM | POA: Insufficient documentation

## 2020-10-15 DIAGNOSIS — Z9013 Acquired absence of bilateral breasts and nipples: Secondary | ICD-10-CM | POA: Diagnosis not present

## 2020-10-15 DIAGNOSIS — C787 Secondary malignant neoplasm of liver and intrahepatic bile duct: Secondary | ICD-10-CM | POA: Diagnosis not present

## 2020-10-15 DIAGNOSIS — Z79810 Long term (current) use of selective estrogen receptor modulators (SERMs): Secondary | ICD-10-CM | POA: Insufficient documentation

## 2020-10-15 DIAGNOSIS — Z5111 Encounter for antineoplastic chemotherapy: Secondary | ICD-10-CM | POA: Diagnosis present

## 2020-10-15 DIAGNOSIS — C50211 Malignant neoplasm of upper-inner quadrant of right female breast: Secondary | ICD-10-CM

## 2020-10-15 DIAGNOSIS — C7800 Secondary malignant neoplasm of unspecified lung: Secondary | ICD-10-CM | POA: Insufficient documentation

## 2020-10-15 DIAGNOSIS — C50112 Malignant neoplasm of central portion of left female breast: Secondary | ICD-10-CM

## 2020-10-15 DIAGNOSIS — Z95828 Presence of other vascular implants and grafts: Secondary | ICD-10-CM

## 2020-10-15 LAB — CBC WITH DIFFERENTIAL/PLATELET
Abs Immature Granulocytes: 0.22 10*3/uL — ABNORMAL HIGH (ref 0.00–0.07)
Basophils Absolute: 0.1 10*3/uL (ref 0.0–0.1)
Basophils Relative: 1 %
Eosinophils Absolute: 0.2 10*3/uL (ref 0.0–0.5)
Eosinophils Relative: 4 %
HCT: 34.5 % — ABNORMAL LOW (ref 36.0–46.0)
Hemoglobin: 11 g/dL — ABNORMAL LOW (ref 12.0–15.0)
Immature Granulocytes: 4 %
Lymphocytes Relative: 11 %
Lymphs Abs: 0.7 10*3/uL (ref 0.7–4.0)
MCH: 30 pg (ref 26.0–34.0)
MCHC: 31.9 g/dL (ref 30.0–36.0)
MCV: 94 fL (ref 80.0–100.0)
Monocytes Absolute: 0.4 10*3/uL (ref 0.1–1.0)
Monocytes Relative: 7 %
Neutro Abs: 4.5 10*3/uL (ref 1.7–7.7)
Neutrophils Relative %: 73 %
Platelets: 288 10*3/uL (ref 150–400)
RBC: 3.67 MIL/uL — ABNORMAL LOW (ref 3.87–5.11)
RDW: 16.8 % — ABNORMAL HIGH (ref 11.5–15.5)
WBC: 6.1 10*3/uL (ref 4.0–10.5)
nRBC: 0 % (ref 0.0–0.2)

## 2020-10-15 LAB — COMPREHENSIVE METABOLIC PANEL WITH GFR
ALT: 42 U/L (ref 0–44)
AST: 30 U/L (ref 15–41)
Albumin: 3.3 g/dL — ABNORMAL LOW (ref 3.5–5.0)
Alkaline Phosphatase: 158 U/L — ABNORMAL HIGH (ref 38–126)
Anion gap: 10 (ref 5–15)
BUN: 10 mg/dL (ref 6–20)
CO2: 27 mmol/L (ref 22–32)
Calcium: 9.2 mg/dL (ref 8.9–10.3)
Chloride: 103 mmol/L (ref 98–111)
Creatinine, Ser: 0.72 mg/dL (ref 0.44–1.00)
GFR, Estimated: >60 mL/min (ref >60)
Glucose, Bld: 103 mg/dL — ABNORMAL HIGH (ref 70–99)
Potassium: 4 mmol/L (ref 3.5–5.1)
Sodium: 140 mmol/L (ref 135–145)
Total Bilirubin: 0.3 mg/dL (ref 0.3–1.2)
Total Protein: 6.9 g/dL (ref 6.5–8.1)

## 2020-10-15 MED ORDER — DIPHENHYDRAMINE HCL 50 MG/ML IJ SOLN
25.0000 mg | Freq: Once | INTRAMUSCULAR | Status: AC
Start: 2020-10-15 — End: 2020-10-15
  Administered 2020-10-15: 25 mg via INTRAVENOUS

## 2020-10-15 MED ORDER — FAMOTIDINE 20 MG IN NS 100 ML IVPB
INTRAVENOUS | Status: AC
  Filled 2020-10-15: qty 100

## 2020-10-15 MED ORDER — DIPHENHYDRAMINE HCL 50 MG/ML IJ SOLN
INTRAMUSCULAR | Status: AC
  Filled 2020-10-15: qty 1

## 2020-10-15 MED ORDER — HEPARIN SOD (PORK) LOCK FLUSH 100 UNIT/ML IV SOLN
500.0000 [IU] | Freq: Once | INTRAVENOUS | Status: AC | PRN
  Administered 2020-10-15: 500 [IU]
  Filled 2020-10-15: qty 5

## 2020-10-15 MED ORDER — SODIUM CHLORIDE 0.9% FLUSH
10.0000 mL | INTRAVENOUS | Status: AC | PRN
  Administered 2020-10-15: 10 mL
  Filled 2020-10-15: qty 10

## 2020-10-15 MED ORDER — FAMOTIDINE 20 MG IN NS 100 ML IVPB
20.0000 mg | Freq: Once | INTRAVENOUS | Status: AC
  Administered 2020-10-15: 20 mg via INTRAVENOUS

## 2020-10-15 MED ORDER — SODIUM CHLORIDE 0.9 % IV SOLN
10.0000 mg | Freq: Once | INTRAVENOUS | Status: AC
  Administered 2020-10-15: 10 mg via INTRAVENOUS
  Filled 2020-10-15: qty 10

## 2020-10-15 MED ORDER — SODIUM CHLORIDE 0.9% FLUSH
10.0000 mL | INTRAVENOUS | Status: DC | PRN
  Administered 2020-10-15: 10 mL
  Filled 2020-10-15: qty 10

## 2020-10-15 MED ORDER — SODIUM CHLORIDE 0.9 % IV SOLN
Freq: Once | INTRAVENOUS | Status: AC
  Filled 2020-10-15: qty 250

## 2020-10-15 MED ORDER — SODIUM CHLORIDE 0.9 % IV SOLN
80.0000 mg/m2 | Freq: Once | INTRAVENOUS | Status: AC
  Administered 2020-10-15: 192 mg via INTRAVENOUS
  Filled 2020-10-15: qty 32

## 2020-10-15 NOTE — Patient Instructions (Signed)
[facility]  Discharge Instructions: Thank you for choosing [facility] to provide your oncology and hematology care.   If you have a lab appointment with the Cancer Center, please go directly to the Cancer Center and check in at the registration area.   Wear comfortable clothing and clothing appropriate for easy access to any Portacath or PICC line.   We strive to give you quality time with your provider. You may need to reschedule your appointment if you arrive late (15 or more minutes).  Arriving late affects you and other patients whose appointments are after yours.  Also, if you miss three or more appointments without notifying the office, you may be dismissed from the clinic at the provider's discretion.      For prescription refill requests, have your pharmacy contact our office and allow 72 hours for refills to be completed.    Today you received the following chemotherapy and/or immunotherapy agent: Paclitaxel (Taxol)   To help prevent nausea and vomiting after your treatment, we encourage you to take your nausea medication as directed.  BELOW ARE SYMPTOMS THAT SHOULD BE REPORTED IMMEDIATELY: *FEVER GREATER THAN 100.4 F (38 C) OR HIGHER *CHILLS OR SWEATING *NAUSEA AND VOMITING THAT IS NOT CONTROLLED WITH YOUR NAUSEA MEDICATION *UNUSUAL SHORTNESS OF BREATH *UNUSUAL BRUISING OR BLEEDING *URINARY PROBLEMS (pain or burning when urinating, or frequent urination) *BOWEL PROBLEMS (unusual diarrhea, constipation, pain near the anus) TENDERNESS IN MOUTH AND THROAT WITH OR WITHOUT PRESENCE OF ULCERS (sore throat, sores in mouth, or a toothache) UNUSUAL RASH, SWELLING OR PAIN  UNUSUAL VAGINAL DISCHARGE OR ITCHING   Items with * indicate a potential emergency and should be followed up as soon as possible or go to the Emergency Department if any problems should occur.  Please show the CHEMOTHERAPY ALERT CARD or IMMUNOTHERAPY ALERT CARD at  check-in to the Emergency Department and triage nurse.  Should you have questions after your visit or need to cancel or reschedule your appointment, please contact [facility]  Dept: [phone]  and follow the prompts.  Office hours are 8:00 a.m. to 4:30 p.m. Monday - Friday. Please note that voicemails left after 4:00 p.m. may not be returned until the following business day.  We are closed weekends and major holidays. You have access to a nurse at all times for urgent questions. Please call the main number to the clinic Dept: [phone] and follow the prompts.   For any non-urgent questions, you may also contact your provider using MyChart. We now offer e-Visits for anyone 18 and older to request care online for non-urgent symptoms. For details visit [email or web address].   Also download the MyChart app! Go to the app store, search "MyChart", open the app, select [facility], and log in with your MyChart username and password.  Due to Covid, a mask is required upon entering the hospital/clinic. If you do not have a mask, one will be given to you upon arrival. For doctor visits, patients may have 1 support person aged 18 or older with them. For treatment visits, patients cannot have anyone with them due to current Covid guidelines and our immunocompromised population.  

## 2020-10-16 LAB — CANCER ANTIGEN 27.29: CA 27.29: 132.4 U/mL — ABNORMAL HIGH (ref 0.0–38.6)

## 2020-10-21 ENCOUNTER — Other Ambulatory Visit (HOSPITAL_COMMUNITY): Payer: Self-pay

## 2020-10-21 MED FILL — Rosuvastatin Calcium Tab 5 MG: ORAL | 30 days supply | Qty: 30 | Fill #1 | Status: AC

## 2020-10-22 ENCOUNTER — Inpatient Hospital Stay: Payer: 59

## 2020-10-22 ENCOUNTER — Other Ambulatory Visit (HOSPITAL_COMMUNITY): Payer: Self-pay

## 2020-10-22 ENCOUNTER — Other Ambulatory Visit: Payer: Self-pay

## 2020-10-22 VITALS — BP 140/65 | HR 76 | Temp 98.5°F | Resp 20 | Wt 285.2 lb

## 2020-10-22 DIAGNOSIS — C787 Secondary malignant neoplasm of liver and intrahepatic bile duct: Secondary | ICD-10-CM

## 2020-10-22 DIAGNOSIS — C50112 Malignant neoplasm of central portion of left female breast: Secondary | ICD-10-CM

## 2020-10-22 DIAGNOSIS — C50211 Malignant neoplasm of upper-inner quadrant of right female breast: Secondary | ICD-10-CM | POA: Diagnosis not present

## 2020-10-22 DIAGNOSIS — Z9013 Acquired absence of bilateral breasts and nipples: Secondary | ICD-10-CM | POA: Diagnosis not present

## 2020-10-22 DIAGNOSIS — Z923 Personal history of irradiation: Secondary | ICD-10-CM | POA: Diagnosis not present

## 2020-10-22 DIAGNOSIS — Z170 Estrogen receptor positive status [ER+]: Secondary | ICD-10-CM

## 2020-10-22 DIAGNOSIS — Z79810 Long term (current) use of selective estrogen receptor modulators (SERMs): Secondary | ICD-10-CM | POA: Diagnosis not present

## 2020-10-22 DIAGNOSIS — C7800 Secondary malignant neoplasm of unspecified lung: Secondary | ICD-10-CM | POA: Diagnosis not present

## 2020-10-22 DIAGNOSIS — C7951 Secondary malignant neoplasm of bone: Secondary | ICD-10-CM | POA: Diagnosis not present

## 2020-10-22 LAB — CBC WITH DIFFERENTIAL/PLATELET
Abs Immature Granulocytes: 0.05 10*3/uL (ref 0.00–0.07)
Basophils Absolute: 0 10*3/uL (ref 0.0–0.1)
Basophils Relative: 1 %
Eosinophils Absolute: 0.1 10*3/uL (ref 0.0–0.5)
Eosinophils Relative: 3 %
HCT: 32.3 % — ABNORMAL LOW (ref 36.0–46.0)
Hemoglobin: 10.8 g/dL — ABNORMAL LOW (ref 12.0–15.0)
Immature Granulocytes: 1 %
Lymphocytes Relative: 17 %
Lymphs Abs: 0.6 10*3/uL — ABNORMAL LOW (ref 0.7–4.0)
MCH: 31 pg (ref 26.0–34.0)
MCHC: 33.4 g/dL (ref 30.0–36.0)
MCV: 92.8 fL (ref 80.0–100.0)
Monocytes Absolute: 0.3 10*3/uL (ref 0.1–1.0)
Monocytes Relative: 9 %
Neutro Abs: 2.6 10*3/uL (ref 1.7–7.7)
Neutrophils Relative %: 69 %
Platelets: 300 10*3/uL (ref 150–400)
RBC: 3.48 MIL/uL — ABNORMAL LOW (ref 3.87–5.11)
RDW: 17.5 % — ABNORMAL HIGH (ref 11.5–15.5)
WBC: 3.7 10*3/uL — ABNORMAL LOW (ref 4.0–10.5)
nRBC: 0 % (ref 0.0–0.2)

## 2020-10-22 LAB — COMPREHENSIVE METABOLIC PANEL
ALT: 24 U/L (ref 0–44)
AST: 18 U/L (ref 15–41)
Albumin: 3.3 g/dL — ABNORMAL LOW (ref 3.5–5.0)
Alkaline Phosphatase: 136 U/L — ABNORMAL HIGH (ref 38–126)
Anion gap: 7 (ref 5–15)
BUN: 11 mg/dL (ref 6–20)
CO2: 29 mmol/L (ref 22–32)
Calcium: 9 mg/dL (ref 8.9–10.3)
Chloride: 104 mmol/L (ref 98–111)
Creatinine, Ser: 0.72 mg/dL (ref 0.44–1.00)
GFR, Estimated: >60 mL/min (ref >60)
Glucose, Bld: 100 mg/dL — ABNORMAL HIGH (ref 70–99)
Potassium: 3.6 mmol/L (ref 3.5–5.1)
Sodium: 140 mmol/L (ref 135–145)
Total Bilirubin: 0.3 mg/dL (ref 0.3–1.2)
Total Protein: 6.8 g/dL (ref 6.5–8.1)

## 2020-10-22 MED ORDER — SODIUM CHLORIDE 0.9% FLUSH
10.0000 mL | INTRAVENOUS | Status: DC | PRN
  Administered 2020-10-22: 10 mL
  Filled 2020-10-22: qty 10

## 2020-10-22 MED ORDER — DENOSUMAB 120 MG/1.7ML ~~LOC~~ SOLN
SUBCUTANEOUS | Status: AC
  Filled 2020-10-22: qty 1.7

## 2020-10-22 MED ORDER — HEPARIN SOD (PORK) LOCK FLUSH 100 UNIT/ML IV SOLN
500.0000 [IU] | Freq: Once | INTRAVENOUS | Status: AC | PRN
  Administered 2020-10-22: 500 [IU]
  Filled 2020-10-22: qty 5

## 2020-10-22 MED ORDER — DIPHENHYDRAMINE HCL 50 MG/ML IJ SOLN
25.0000 mg | Freq: Once | INTRAMUSCULAR | Status: AC
Start: 2020-10-22 — End: 2020-10-22
  Administered 2020-10-22: 25 mg via INTRAVENOUS

## 2020-10-22 MED ORDER — FAMOTIDINE 20 MG IN NS 100 ML IVPB
INTRAVENOUS | Status: AC
  Filled 2020-10-22: qty 100

## 2020-10-22 MED ORDER — FAMOTIDINE 20 MG IN NS 100 ML IVPB
20.0000 mg | Freq: Once | INTRAVENOUS | Status: AC
  Administered 2020-10-22: 20 mg via INTRAVENOUS

## 2020-10-22 MED ORDER — SODIUM CHLORIDE 0.9 % IV SOLN
Freq: Once | INTRAVENOUS | Status: AC
  Filled 2020-10-22: qty 250

## 2020-10-22 MED ORDER — DIPHENHYDRAMINE HCL 50 MG/ML IJ SOLN
INTRAMUSCULAR | Status: AC
  Filled 2020-10-22: qty 1

## 2020-10-22 MED ORDER — SODIUM CHLORIDE 0.9 % IV SOLN
10.0000 mg | Freq: Once | INTRAVENOUS | Status: AC
  Administered 2020-10-22: 10 mg via INTRAVENOUS
  Filled 2020-10-22: qty 10

## 2020-10-22 MED ORDER — DENOSUMAB 120 MG/1.7ML ~~LOC~~ SOLN
120.0000 mg | Freq: Once | SUBCUTANEOUS | Status: AC
  Administered 2020-10-22: 120 mg via SUBCUTANEOUS

## 2020-10-22 MED ORDER — SODIUM CHLORIDE 0.9 % IV SOLN
80.0000 mg/m2 | Freq: Once | INTRAVENOUS | Status: AC
  Administered 2020-10-22: 192 mg via INTRAVENOUS
  Filled 2020-10-22: qty 32

## 2020-10-22 NOTE — Patient Instructions (Signed)
[facility]  Discharge Instructions: Thank you for choosing [facility] to provide your oncology and hematology care.   If you have a lab appointment with the [facility], please go directly to the [facility] and check in at the registration area.   Wear comfortable clothing and clothing appropriate for easy access to any Portacath or PICC line.   We strive to give you quality time with your provider. You may need to reschedule your appointment if you arrive late (15 or more minutes).  Arriving late affects you and other patients whose appointments are after yours.  Also, if you miss three or more appointments without notifying the office, you may be dismissed from the clinic at the provider's discretion.      For prescription refill requests, have your pharmacy contact our office and allow 72 hours for refills to be completed.    Today you received the following chemotherapy and/or immunotherapy agents Taxol      To help prevent nausea and vomiting after your treatment, we encourage you to take your nausea medication as directed.  BELOW ARE SYMPTOMS THAT SHOULD BE REPORTED IMMEDIATELY: *FEVER GREATER THAN 100.4 F (38 C) OR HIGHER *CHILLS OR SWEATING *NAUSEA AND VOMITING THAT IS NOT CONTROLLED WITH YOUR NAUSEA MEDICATION *UNUSUAL SHORTNESS OF BREATH *UNUSUAL BRUISING OR BLEEDING *URINARY PROBLEMS (pain or burning when urinating, or frequent urination) *BOWEL PROBLEMS (unusual diarrhea, constipation, pain near the anus) TENDERNESS IN MOUTH AND THROAT WITH OR WITHOUT PRESENCE OF ULCERS (sore throat, sores in mouth, or a toothache) UNUSUAL RASH, SWELLING OR PAIN  UNUSUAL VAGINAL DISCHARGE OR ITCHING   Items with * indicate a potential emergency and should be followed up as soon as possible or go to the Emergency Department if any problems should occur.  Please show the CHEMOTHERAPY ALERT CARD or IMMUNOTHERAPY ALERT CARD at check-in to the  Emergency Department and triage nurse.  Should you have questions after your visit or need to cancel or reschedule your appointment, please contact [facility]  Dept: [phone]  and follow the prompts.  Office hours are 8:00 a.m. to 4:30 p.m. Monday - Friday. Please note that voicemails left after 4:00 p.m. may not be returned until the following business day.  We are closed weekends and major holidays. You have access to a nurse at all times for urgent questions. Please call the main number to the clinic Dept: [phone] and follow the prompts.   For any non-urgent questions, you may also contact your provider using MyChart. We now offer e-Visits for anyone 33 and older to request care online for non-urgent symptoms. For details visit [email or web address].   Also download the MyChart app! Go to the app store, search "MyChart", open the app, select [facility], and log in with your MyChart username and password.  Due to Covid, a mask is required upon entering the hospital/clinic. If you do not have a mask, one will be given to you upon arrival. For doctor visits, patients may have 1 support person aged 15 or older with them. For treatment visits, patients cannot have anyone with them due to current Covid guidelines and our immunocompromised population.   Paclitaxel injection What is this medication? PACLITAXEL (PAK li TAX el) is a chemotherapy drug. It targets fast dividing cells, like cancer cells, and causes these cells to die. This medicine is used to treat ovarian cancer, breast cancer, lung cancer, Kaposi's sarcoma, andother cancers. This medicine may be  used for other purposes; ask your health care provider orpharmacist if you have questions. COMMON BRAND NAME(S): Onxol, Taxol What should I tell my care team before I take this medication? They need to know if you have any of these conditions: history of irregular heartbeat liver disease low blood  counts, like low white cell, platelet, or red cell counts lung or breathing disease, like asthma tingling of the fingers or toes, or other nerve disorder an unusual or allergic reaction to paclitaxel, alcohol, polyoxyethylated castor oil, other chemotherapy, other medicines, foods, dyes, or preservatives pregnant or trying to get pregnant breast-feeding How should I use this medication? This drug is given as an infusion into a vein. It is administered in a hospitalor clinic by a specially trained health care professional. Talk to your pediatrician regarding the use of this medicine in children.Special care may be needed. Overdosage: If you think you have taken too much of this medicine contact apoison control center or emergency room at once. NOTE: This medicine is only for you. Do not share this medicine with others. What if I miss a dose? It is important not to miss your dose. Call your doctor or health careprofessional if you are unable to keep an appointment. What may interact with this medication? Do not take this medicine with any of the following medications: live virus vaccines This medicine may also interact with the following medications: antiviral medicines for hepatitis, HIV or AIDS certain antibiotics like erythromycin and clarithromycin certain medicines for fungal infections like ketoconazole and itraconazole certain medicines for seizures like carbamazepine, phenobarbital, phenytoin gemfibrozil nefazodone rifampin St. John's wort This list may not describe all possible interactions. Give your health care provider a list of all the medicines, herbs, non-prescription drugs, or dietary supplements you use. Also tell them if you smoke, drink alcohol, or use illegaldrugs. Some items may interact with your medicine. What should I watch for while using this medication? Your condition will be monitored carefully while you are receiving this medicine. You will need important blood  work done while you are taking thismedicine. This medicine can cause serious allergic reactions. To reduce your risk you will need to take other medicine(s) before treatment with this medicine. If you experience allergic reactions like skin rash, itching or hives, swelling of theface, lips, or tongue, tell your doctor or health care professional right away. In some cases, you may be given additional medicines to help with side effects.Follow all directions for their use. This drug may make you feel generally unwell. This is not uncommon, as chemotherapy can affect healthy cells as well as cancer cells. Report any side effects. Continue your course of treatment even though you feel ill unless yourdoctor tells you to stop. Call your doctor or health care professional for advice if you get a fever, chills or sore throat, or other symptoms of a cold or flu. Do not treat yourself. This drug decreases your body's ability to fight infections. Try toavoid being around people who are sick. This medicine may increase your risk to bruise or bleed. Call your doctor orhealth care professional if you notice any unusual bleeding. Be careful brushing and flossing your teeth or using a toothpick because you may get an infection or bleed more easily. If you have any dental work done,tell your dentist you are receiving this medicine. Avoid taking products that contain aspirin, acetaminophen, ibuprofen, naproxen, or ketoprofen unless instructed by your doctor. These medicines may hide afever. Do not become pregnant while taking this medicine.  Women should inform their doctor if they wish to become pregnant or think they might be pregnant. There is a potential for serious side effects to an unborn child. Talk to your health care professional or pharmacist for more information. Do not breast-feed aninfant while taking this medicine. Men are advised not to father a child while receiving this medicine. This product may contain  alcohol. Ask your pharmacist or healthcare provider if this medicine contains alcohol. Be sure to tell all healthcare providers you are taking this medicine. Certain medicines, like metronidazole and disulfiram, can cause an unpleasant reaction when taken with alcohol. The reaction includes flushing, headache, nausea, vomiting, sweating, and increased thirst. Thereaction can last from 30 minutes to several hours. What side effects may I notice from receiving this medication? Side effects that you should report to your doctor or health care professionalas soon as possible: allergic reactions like skin rash, itching or hives, swelling of the face, lips, or tongue breathing problems changes in vision fast, irregular heartbeat high or low blood pressure mouth sores pain, tingling, numbness in the hands or feet signs of decreased platelets or bleeding - bruising, pinpoint red spots on the skin, black, tarry stools, blood in the urine signs of decreased red blood cells - unusually weak or tired, feeling faint or lightheaded, falls signs of infection - fever or chills, cough, sore throat, pain or difficulty passing urine signs and symptoms of liver injury like dark yellow or brown urine; general ill feeling or flu-like symptoms; light-colored stools; loss of appetite; nausea; right upper belly pain; unusually weak or tired; yellowing of the eyes or skin swelling of the ankles, feet, hands unusually slow heartbeat Side effects that usually do not require medical attention (report to yourdoctor or health care professional if they continue or are bothersome): diarrhea hair loss loss of appetite muscle or joint pain nausea, vomiting pain, redness, or irritation at site where injected tiredness This list may not describe all possible side effects. Call your doctor for medical advice about side effects. You may report side effects to FDA at1-800-FDA-1088. Where should I keep my medication? This drug is  given in a hospital or clinic and will not be stored at home. NOTE: This sheet is a summary. It may not cover all possible information. If you have questions about this medicine, talk to your doctor, pharmacist, orhealth care provider.  2022 [employer]vier/Gold Standard (2019-03-29 13:37:23)

## 2020-10-23 LAB — CANCER ANTIGEN 27.29: CA 27.29: 118.1 U/mL — ABNORMAL HIGH (ref 0.0–38.6)

## 2020-10-24 DIAGNOSIS — R7301 Impaired fasting glucose: Secondary | ICD-10-CM | POA: Diagnosis not present

## 2020-10-24 DIAGNOSIS — E890 Postprocedural hypothyroidism: Secondary | ICD-10-CM | POA: Diagnosis not present

## 2020-11-04 NOTE — Progress Notes (Signed)
[place]  [phone] [phone]    ID: Debbie [name] DOB: [birth year]  MR#: [id number]  GXQ#:[id number]  Patient Care Team: Kelton Pillar, MD as PCP - General (Family Medicine) Jacelyn Pi, MD as Consulting Physician (Endocrinology) Jovita Kussmaul, MD as Consulting Physician (General Surgery) Donnika Kucher, Virgie Dad, MD as Consulting Physician (Oncology) [name], MD as Attending Physician (Radiation Oncology) Belva Crome, MD as Consulting Physician (Cardiology) Crissie Reese, MD as Consulting Physician (Plastic Surgery) [name], [name], NP as Nurse Practitioner (Hematology and Oncology) Lajoyce Lauber, DMD (Dentistry) OTHER MD:   CHIEF COMPLAINT: Estrogen receptor positive stage IV  breast cancer (s/p bilateral mastectomies)  CURRENT TREATMENT: paclitaxel; denosumab/Xgeva   INTERVAL HISTORY: Debbie [name] returns today for follow up and treatment of her stage IV breast cancer.  She was switched to weekly paclitaxel on 09/10/2020. She receives treatments days 1, 8 and 15 of each 28-day cycle. Today is day 1, cycle 3.  She continues on Xgeva every 4 weeks, with her most recent dose on 10/22/2020.  She has no side effects from this that she is aware of  We are following her CA 27-29 which is showing significant trend:  Lab Results  Component Value Date   CA2729 118.1 (H) 10/22/2020   CA2729 132.4 (H) 10/15/2020   CA2729 150.3 (H) 10/08/2020   CA2729 222.0 (H) 09/24/2020   CA2729 306.3 (H) 09/17/2020    REVIEW OF SYSTEMS: Debbie [name] is having a hard time getting through chemo.  She is still working 40 hours a day.  She feels exhausted.  Her back is very stiff.  She has a pain that sometimes runs down her left leg.  She is using a cane.  She continues to gain weight.  Some of this is fluid.  She feels short of breath with activity.  Right arm is more swollen.  She is developing some acne.  She has some constipation, rare  diarrhea.  Her mouth is sore although she does not have actual mouth sores.  She has problems with her dentures.  She has a little bit of reflux.  She has no peripheral neuropathy in her fingers and the mild numbness in her toes is unchanged.  A detailed review of systems today was otherwise stable   COVID 19 VACCINATION STATUS: Status post Pfizer x2, with booster January 2021   BREAST CANCER HISTORY: From the original intake note:  Chavon had screening mammography with tomosynthesis at the [facility] 10/02/2015. This showed 2 possible masses in the right breast as well as some suspicious calcifications. The left breast was unremarkable. On 10/15/2015 she underwent right diagnostic mammography with tomography and right breast ultrasonography. The breast density was category B. Spot magnification views confirmed an area of pleomorphic calcifications measuring up to 5 cm in the upper half of the breast. Also in the upper outer right breast there were adjacent low-density masses without distortion or calcifications. On physical exam there was no palpable abnormality.   Targeted ultrasound of the right breast found the 2 "masses" in the right upper quadrant were benign cysts measuring 0.8 and 0.5 cm respectively. Biopsy of the area of calcification on 10/15/2015 showed ([id number]) invasive ductal carcinoma, E-cadherin positive, grade 2, estrogen receptor 90% positive, progesterone receptor 50% positive, both with strong staining intensity, with an MIB-1 of 70%, and no HER-2 amplification, the signals ratio being 1.18 and the number per cell 2.60.   Her subsequent history is as detailed below  PAST MEDICAL HISTORY: Past Medical History:  Diagnosis Date   Anxiety    Arthritis    Bone metastasis ([place])    Breast cancer of upper-inner quadrant of right female breast [facility]) oncologist-  dr [name]--- 04/ 2019 ER/PR+ Stage IV w/ metastatic disease to liver, total spine, lung, bone   dx 06/ 2017   right breast invasive ductal carcinoma, Stage IIB, ER/PR + (pT2 pN1), grade 1-- s/p  right breast lumpecotmy w/ sln bx's and bilateral breast reduction 11-28-2015/  left breast with reduction , dx invasive ductal ca, Grade 1 (pT1cpNX) ER/PR positive/  adjuvant radiation completed 03-27-2016 to right chest wall, 2018  reclassifiec Stage IIA   Cancer, metastatic to liver ([place])    Chronic back pain    Constipation    Depression    Family history of adverse reaction to anesthesia     mother has Copd was kept on vent    Headache    Heart murmur    History of hyperthyroidism    s/p  RAI   History of radiation therapy 02/11/16- 03/27/16   Right Chest Wall 50.4 Gy in 28 fractions, Right Chest Wall Boost 10 Gy in 5 fractions.    Hyperlipidemia    Hypertension    Hypothyroidism, postradioiodine therapy    endocrinoloigst-  dr balan--  2004  s/p  RAI   Menorrhagia 2011   Multiple pulmonary nodules    secondary  metastatic   PONV (postoperative nausea and vomiting)    patch helps    SUI (stress urinary incontinence, female)    Wears contact lenses    Wears dentures    full upper and lower partial    PAST SURGICAL HISTORY: Past Surgical History:  Procedure Laterality Date   BREAST LUMPECTOMY WITH NEEDLE LOCALIZATION AND AXILLARY SENTINEL LYMPH NODE BX Right 11/28/2015   Procedure: RIGHT BREAST LUMPECTOMY WITH NEEDLE LOCALIZATION AND AXILLARY SENTINEL LYMPH NODE BX;  Surgeon: Autumn Messing III, MD;  Location: [place];  Service: General;  Laterality: Right;   BREAST REDUCTION SURGERY Bilateral 11/28/2015   Procedure: MAMMARY REDUCTION  (BREAST) BILATERAL;  Surgeon: Crissie Reese, MD;  Location: [place];  Service: Plastics;  Laterality: Bilateral;   CESAREAN SECTION  01/12/1993   COLONOSCOPY     Dental surgeries     FEMUR IM NAIL Left 01/28/2018   Procedure: INTRAMEDULLARY (IM) NAIL FEMORAL;  Surgeon: Nicholes Stairs, MD;  Location: [place];  Service: Orthopedics;  Laterality: Left;   HYSTEROSCOPY N/A  09/02/2017   Procedure: HYSTEROSCOPY  to remove IUD;  Surgeon: Eldred Manges, MD;  Location: [place];  Service: Gynecology;  Laterality: N/A;   IR IMAGING GUIDED PORT INSERTION  09/16/2020   Liver biospy     MASTECTOMY     MASTECTOMY W/ SENTINEL NODE BIOPSY Bilateral 12/26/2015   Procedure: BILATERAL MASTECTOMY WITH LEFT SENTINEL LYMPH NODE BIOPSY;  Surgeon: Autumn Messing III, MD;  Location: [place];  Service: General;  Laterality: Bilateral;   Removal of IUD     TUBAL LIGATION Bilateral 1995    FAMILY HISTORY: Family History  Problem Relation Age of Onset   Diabetes Mother    Hypertension Mother    Hypertension Father    Diabetes Maternal Grandmother    Hypertension Sister    Cancer Paternal Grandmother        colon   Colon cancer Paternal Grandmother    Esophageal cancer Neg Hx    Stomach cancer Neg Hx    Rectal cancer  Neg Hx   The patient's parents are still living, in their early 82s as of June 2017. The patient has one brother, 2 sisters. On the maternal side there is a history of uterine and prostate cancer. On the paternal side there is a history of colon cancer and possibly uterine cancer. There is no history of breast or ovarian cancer in the family.    GYNECOLOGIC HISTORY:  Patient's last menstrual period was 12/19/2015.  Menarche age 29. She still having regular periods. She is GX P1, first pregnancy age 42. She used oral contraceptives for a few years remotely, with no complications. She is currently on a Mirena IUD and also is status post bilateral tubal ligation.   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 Her husband [name] is a Administrator.  They are both taking appropriate precautions at work.  Their son [name], age 15, is a radio DJ.  He no longer lives with the patient.  The patient has no grandchildren. She is not a Ambulance person.   ADVANCED DIRECTIVES: In the absence of any documents to the  contrary her husband is her healthcare power of attorney   HEALTH MAINTENANCE: Social History   Tobacco Use   Smoking status: Never   Smokeless tobacco: Never  Vaping Use   Vaping Use: Never used  Substance Use Topics   Alcohol use: No   Drug use: No     Colonoscopy: January 2016   PAP: January 2016   Bone density: August 2017 showed a T score of +0.7 normal  Lipid panel:  Allergies  Allergen Reactions   Ace Inhibitors Cough   Atorvastatin Other (See Comments)    Joint pain   Simvastatin Other (See Comments)    Joint pain    Current Outpatient Medications  Medication Sig Dispense Refill   doxycycline (VIBRA-TABS) 100 MG tablet Take 1 tablet (100 mg total) by mouth daily. 30 tablet 2   omeprazole (PRILOSEC) 40 MG capsule Take 1 capsule (40 mg total) by mouth daily. 30 capsule 3   acetaminophen (TYLENOL) 500 MG tablet Take 1,000 mg by mouth every 6 (six) hours as needed.     blood glucose meter kit and supplies KIT Dispense based on patient and insurance preference. Use up to four times daily as directed. (FOR ICD-9 250.00, 250.01). 1 each 0   Calcium-Magnesium 250-125 MG TABS Take 1 tablet by mouth daily. 120 each 4   cetirizine (ZYRTEC) 10 MG tablet Take 10 mg by mouth daily as needed for allergies.      chlorhexidine (PERIDEX) 0.12 % solution Use 5 mLs in the mouth or throat 2 (two) times daily as needed as directed. Do not swallow 473 mL 6   Ferrous Sulfate (IRON) 325 (65 Fe) MG TABS Take 1 tablet (325 mg total) by mouth daily. 30 each 2   furosemide (LASIX) 20 MG tablet Take 1 tablet (20 mg total) by mouth daily. 90 tablet 6   glucose blood test strip USE AS DIRECTED TO TEST UP TO FOUR TIMES DAILY (Patient taking differently: USE AS DIRECTED TO TEST UP TO FOUR TIMES DAILY) 100 strip 0   ibuprofen (ADVIL) 800 MG tablet Take 1 tablet (800 mg total) by mouth 2 (two) times daily as needed. 30 tablet 1   Lancets (ONETOUCH ULTRASOFT) lancets Use as instructed 100 each 12    levothyroxine (SYNTHROID) 137 MCG tablet TAKE 1 TABLET BY MOUTH ON AN EMPTY STOMACH EVERY MORNING  DAILY 90 DAYS 90 tablet 3   lidocaine-prilocaine (EMLA) cream Apply to affected area once 30 g 3   LORazepam (ATIVAN) 1 MG tablet TAKE 1/2 TO 1 TABLET BY MOUTH AT BEDTIME AS NEEDED 30 tablet 0   losartan-hydrochlorothiazide (HYZAAR) 100-25 MG tablet TAKE 1 TABLET BY MOUTH ONCE DAILY 90 tablet 1   Multiple Vitamin (MULTIVITAMIN) tablet Take 1 tablet by mouth daily.     omega-3 acid ethyl esters (LOVAZA) 1 g capsule Take 1 g by mouth 2 (two) times daily.      oxyCODONE (OXY IR/ROXICODONE) 5 MG immediate release tablet Take 1 tablet (5 mg total) by mouth every 4 (four) hours as needed for severe pain. 60 tablet 0   potassium chloride SA (KLOR-CON) 20 MEQ tablet Take 2 tablets (40 mEq total) by mouth daily. 90 tablet 4   pregabalin (LYRICA) 100 MG capsule TAKE 1 CAPSULE BY MOUTH 3 TIMES A DAY 90 capsule 0   prochlorperazine (COMPAZINE) 10 MG tablet Take 1 tablet (10 mg total) by mouth every 6 (six) hours as needed (Nausea or vomiting). 30 tablet 1   rosuvastatin (CRESTOR) 5 MG tablet TAKE 1 TABLET BY MOUTH ONCE DAILY 30 tablet 7   sertraline (ZOLOFT) 100 MG tablet TAKE 1 TABLET BY MOUTH ONCE A DAY 30 tablet 12   valACYclovir (VALTREX) 1000 MG tablet Take 1 tablet (1,000 mg total) by mouth daily. 60 tablet 6   Vitamin D, Cholecalciferol, 1000 units CAPS Take 1 tablet by mouth daily. (Patient taking differently: Take 1,000 Units by mouth daily.) 90 capsule 4   No current facility-administered medications for this visit.   Facility-Administered Medications Ordered in Other Visits  Medication Dose Route Frequency Provider Last Rate Last Admin   sodium chloride flush (NS) 0.9 % injection 10 mL  10 mL Intravenous PRN Jany Buckwalter, Virgie Dad, MD   10 mL at 11/05/20 0754    OBJECTIVE: African-American [name] who appears stated age  3:   11/05/20 0812  BP: (!) 152/64  Pulse: 94  Resp: 17  Temp: 97.8 F  (36.6 C)  SpO2: 98%     Body mass index is 47.45 kg/m.    ECOG FS:1 - Symptomatic but completely ambulatory  Filed Weights   11/05/20 0812  Weight: 294 lb (133.4 kg)    Sclerae unicteric, EOMs intact Wearing a mask No cervical or supraclavicular adenopathy Lungs no rales or rhonchi Heart regular rate and rhythm Abd soft, obese nontender, positive bowel sounds MSK no focal spinal tenderness, no upper extremity lymphedema Neuro: nonfocal, well oriented, appropriate affect Breasts: Deferred   LAB RESULTS:  CMP     Component Value Date/Time   NA 140 10/22/2020 0848   NA 140 07/08/2016 0948   K 3.6 10/22/2020 0848   K 4.1 07/08/2016 0948   CL 104 10/22/2020 0848   CO2 29 10/22/2020 0848   CO2 24 07/08/2016 0948   GLUCOSE 100 (H) 10/22/2020 0848   GLUCOSE 89 07/08/2016 0948   BUN 11 10/22/2020 0848   BUN 11.9 07/08/2016 0948   CREATININE 0.72 10/22/2020 0848   CREATININE 0.84 08/22/2020 1050   CREATININE 0.9 07/08/2016 0948   CALCIUM 9.0 10/22/2020 0848   CALCIUM 10.0 07/08/2016 0948   PROT 6.8 10/22/2020 0848   PROT 7.6 07/08/2016 0948   ALBUMIN 3.3 (L) 10/22/2020 0848   ALBUMIN 3.6 07/08/2016 0948   AST 18 10/22/2020 0848   AST 16 08/22/2020 1050   AST 22 07/08/2016 0948   ALT 24 10/22/2020  0848   ALT 14 08/22/2020 1050   ALT 27 07/08/2016 0948   ALKPHOS 136 (H) 10/22/2020 0848   ALKPHOS 115 07/08/2016 0948   BILITOT 0.3 10/22/2020 0848   BILITOT 0.3 08/22/2020 1050   BILITOT 0.35 07/08/2016 0948   GFRNONAA >60 10/22/2020 0848   GFRNONAA >60 08/22/2020 1050   GFRAA >60 01/30/2020 0814   GFRAA >60 05/16/2019 0905    INo results found for: SPEP, UPEP  Lab Results  Component Value Date   WBC 5.5 11/05/2020   NEUTROABS 4.1 11/05/2020   HGB 10.8 (L) 11/05/2020   HCT 32.6 (L) 11/05/2020   MCV 93.9 11/05/2020   PLT 224 11/05/2020      Chemistry      Component Value Date/Time   NA 140 10/22/2020 0848   NA 140 07/08/2016 0948   K 3.6 10/22/2020 0848    K 4.1 07/08/2016 0948   CL 104 10/22/2020 0848   CO2 29 10/22/2020 0848   CO2 24 07/08/2016 0948   BUN 11 10/22/2020 0848   BUN 11.9 07/08/2016 0948   CREATININE 0.72 10/22/2020 0848   CREATININE 0.84 08/22/2020 1050   CREATININE 0.9 07/08/2016 0948      Component Value Date/Time   CALCIUM 9.0 10/22/2020 0848   CALCIUM 10.0 07/08/2016 0948   ALKPHOS 136 (H) 10/22/2020 0848   ALKPHOS 115 07/08/2016 0948   AST 18 10/22/2020 0848   AST 16 08/22/2020 1050   AST 22 07/08/2016 0948   ALT 24 10/22/2020 0848   ALT 14 08/22/2020 1050   ALT 27 07/08/2016 0948   BILITOT 0.3 10/22/2020 0848   BILITOT 0.3 08/22/2020 1050   BILITOT 0.35 07/08/2016 0948       No results found for: LABCA2  No components found for: LABCA125  No results for input(s): INR in the last 168 hours.  Urinalysis No results found for: COLORURINE, APPEARANCEUR, LABSPEC, PHURINE, GLUCOSEU, HGBUR, BILIRUBINUR, KETONESUR, PROTEINUR, UROBILINOGEN, NITRITE, LEUKOCYTESUR   STUDIES: No results found.   ELIGIBLE FOR AVAILABLE RESEARCH PROTOCOL: PALLAS, but inelegible because of Mirena IUD   ASSESSMENT: 55 y.o. Debbie [name] status post right breast upper inner quadrant biopsy 10/15/2015 for a clinical T2-T3 N0, stage II invasive ductal carcinoma, estrogen and progesterone receptor positive, HER-2 nonamplified, with an Mib-1 of 70%  (1) status post right lumpectomy and sentinel lymph node sampling with oncoplastic breast reduction 11/28/2015 for a pT2 pN1, stage IIB invasive ductal carcinoma, grade 1, with negative margins (1/1 sentinel node positive)  (a) reclassified as stage IIA in the 2018 prognostic revision   (2) left reduction mammoplasty 11/28/2015 unexpectedly found a pT1c pNX invasive ductal carcinoma, grade 1, estrogen and progesterone receptor positive, HER-2 not amplified, with an MIB-1 of 3%  (3) Mammaprint from the right sided breast cancer was "low risk", predicting a 98% chance of no disease  recurrence within 5 years with anti-estrogens as the only systemic therapy. It also predicts minimal to no benefit from chemotherapy  (4) status post bilateral mastectomies with bilateral sentinel lymph node sampling 12/26/2015, showing  (a) on the right, no residual carcinoma (0/3 nodes involved)  (b) on the left, focal ductal carcinoma in situ, 0.2 cm, with negative margins; (0/4 nodes involved)  (5) adjuvant radiation 02/11/16 - 03/27/16    1) Right chest wall: 50.4 Gy in 28 fractions               2) Right chest wall boost: 10 Gy in 5 fractions   (6) started on  tamoxifen neoadjuvantly 10/23/2015 to allow for expected delays in definitive local treatment  (a) Mirena IUD in place  (b) tamoxifen discontinued 08/13/2017, with progression  (7) since she had 4 lymph nodes removed from each axilla but also receive radiation on the right, lab draws should preferentially be obtained from the left arm   METASTATIC DISEASE: April 2019 (8) CT scan of the abdomen and pelvis 08/04/2017, MRI of the liver and total spine MRI 08/11/2017 showed liver and bone metastases  (a) liver biopsy 09/07/2017 confirms estrogen receptor positive, progesterone receptor negative, HER-2 negative metastatic breast cancer  (b) chest CT scan 08/13/2017 shows multiple bilateral pulmonary nodules.  (c) CT of the brain with and without contrast 08/26/2017 shows no evidence of metastatic disease to the brain  (d) CA 27-29 is informative  (9) fulvestrant started 08/17/2017  (a) palbociclib started 08/31/2017  (b) fulvestrant and palbociclib discontinued 01/04/2018 with evidence of progression in the liver  (10) denosumab/Xgeva started 09/14/2017  (a) held after 03/01/2018 dose because concern re. osteonecrosis  (b) resumed 06/09/2018  (c) 02/21/2019 dose held due to oral surgery, resumed 03/21/2019  (11) anastrozole started 01/04/2018  (a) everolimus started 01/29/2018  (b) liver MRI and bone scan December 2019 show  stable disease  (c) chest CT scan on 08/02/2018 shows lung lesions resolved, bone lesions stable  (d) bone scan 09/13/2018 essentially stable  (e) liver MRI 12/08/2018 shows improvement in hepatic metastsases  (f) liver MRI and bone scan 04/13/2019 showed continuing improvement in the liver, stable bone lesions  (g) chest CT 08/01/2019 stable, no evidence of active disease ows  (12) 01/28/18 prophylactic left femur intramedullary nail surgery for impending left femur pathologic fracture  (a) palliative radiation with Dr. [name] to the left femur and left ilium from 02/16/2018 to 03/01/2018  (13) [facility] report on liver biopsy from April 2019 shows a negative PDL 1, stable MSI and proficient mismatch repair status.  The tumor is positive for the androgen receptor, and for PTEN for PIK3 CA mutations.  HER-2/neu was read as equivocal (it was negative by FISH on the pathology report).  (14) Progression noted in liver MRI on 01/11/2020; fulvestrant started on 01/30/2020  (a) alpelisib started 01/30/2020 at 300 mg daily with Metformin 500 mg daily  (b) alpelisib held on 02/07/2020 with very high blood sugars.  Metformin increased to 1 g daily  (c) alpelisib resumed at 150 mg daily on 02/13/2020  (d) liver MRI 05/06/2020 shows evidence of disease progression  (e) fulvestrant and alpelisib discontinued January 2022  (15) capecitabine started 05/22/2020 at 1.5 g twice daily, 14/7  (a) abdominal MRI 08/23/2020 shows worsening liver disease  (b) chest CT scan 09/04/2020 shows new lung metastases  (c) capecitabine discontinued April 2022  (16) paclitaxel started 09/10/2020, repeated days 1, 8, and 15 of each 28-day cycle  (A) changed to every 2 weeks beginning with cycle 3 (11/05/2020)   PLAN: Debbie [name] is having a hard time with her Taxol treatments.  I am going to drop the day 8 treatment so she will effectively be treated on day 1 and day 15 of each 28-day cycle.  I am hopeful this will make the treatments  more bearable without significantly decreasing effectiveness.  We do have a significant drop in her CA 27-29 which is very encouraging.  I am asking her to take Lasix daily and continue potassium daily.  I hope this will help with the swelling problem.  We are starting her on doxycycline for the acne she  has developed which is going to be due to the premeds.  I added Prilosec for her reflux symptoms.    I have put her in for a CT of the chest before she returns to see me in 4 weeks.  I think she will get good news from that report  She knows to call for any other issue admittable before the next visit  Total encounter time 35 minutes.Sarajane Jews C. Toleen Lachapelle, MD 11/05/20 8:28 AM Medical Oncology and Hematology [facility] [place], [place] [address] Tel. [phone]    Fax. [phone]   I, Wilburn Mylar, am acting as scribe for Dr. Virgie Dad. Phyliss Hulick.  I, Lurline Del MD, have reviewed the above documentation for accuracy and completeness, and I agree with the above.    *Total Encounter Time as defined by the Centers for Medicare and Medicaid Services includes, in addition to the face-to-face time of a patient visit (documented in the note above) non-face-to-face time: obtaining and reviewing outside history, ordering and reviewing medications, tests or procedures, care coordination (communications with other health care professionals or caregivers) and documentation in the medical record.

## 2020-11-05 ENCOUNTER — Other Ambulatory Visit (HOSPITAL_COMMUNITY): Payer: Self-pay

## 2020-11-05 ENCOUNTER — Inpatient Hospital Stay: Payer: 59

## 2020-11-05 ENCOUNTER — Other Ambulatory Visit (HOSPITAL_BASED_OUTPATIENT_CLINIC_OR_DEPARTMENT_OTHER): Payer: Self-pay

## 2020-11-05 ENCOUNTER — Other Ambulatory Visit: Payer: Self-pay

## 2020-11-05 ENCOUNTER — Inpatient Hospital Stay (HOSPITAL_BASED_OUTPATIENT_CLINIC_OR_DEPARTMENT_OTHER): Payer: 59 | Admitting: Oncology

## 2020-11-05 VITALS — BP 152/64 | HR 94 | Temp 97.8°F | Resp 17 | Ht 66.0 in | Wt 294.0 lb

## 2020-11-05 DIAGNOSIS — C787 Secondary malignant neoplasm of liver and intrahepatic bile duct: Secondary | ICD-10-CM | POA: Diagnosis not present

## 2020-11-05 DIAGNOSIS — Z170 Estrogen receptor positive status [ER+]: Secondary | ICD-10-CM | POA: Diagnosis not present

## 2020-11-05 DIAGNOSIS — C7951 Secondary malignant neoplasm of bone: Secondary | ICD-10-CM | POA: Diagnosis not present

## 2020-11-05 DIAGNOSIS — Z95828 Presence of other vascular implants and grafts: Secondary | ICD-10-CM

## 2020-11-05 DIAGNOSIS — Z923 Personal history of irradiation: Secondary | ICD-10-CM | POA: Diagnosis not present

## 2020-11-05 DIAGNOSIS — C50211 Malignant neoplasm of upper-inner quadrant of right female breast: Secondary | ICD-10-CM

## 2020-11-05 DIAGNOSIS — C50112 Malignant neoplasm of central portion of left female breast: Secondary | ICD-10-CM | POA: Diagnosis not present

## 2020-11-05 DIAGNOSIS — Z9013 Acquired absence of bilateral breasts and nipples: Secondary | ICD-10-CM | POA: Diagnosis not present

## 2020-11-05 DIAGNOSIS — C7800 Secondary malignant neoplasm of unspecified lung: Secondary | ICD-10-CM | POA: Diagnosis not present

## 2020-11-05 DIAGNOSIS — Z79810 Long term (current) use of selective estrogen receptor modulators (SERMs): Secondary | ICD-10-CM | POA: Diagnosis not present

## 2020-11-05 LAB — COMPREHENSIVE METABOLIC PANEL
ALT: 17 U/L (ref 0–44)
AST: 16 U/L (ref 15–41)
Albumin: 3.1 g/dL — ABNORMAL LOW (ref 3.5–5.0)
Alkaline Phosphatase: 133 U/L — ABNORMAL HIGH (ref 38–126)
Anion gap: 9 (ref 5–15)
BUN: 10 mg/dL (ref 6–20)
CO2: 27 mmol/L (ref 22–32)
Calcium: 9.2 mg/dL (ref 8.9–10.3)
Chloride: 105 mmol/L (ref 98–111)
Creatinine, Ser: 0.78 mg/dL (ref 0.44–1.00)
GFR, Estimated: >60 mL/min (ref >60)
Glucose, Bld: 103 mg/dL — ABNORMAL HIGH (ref 70–99)
Potassium: 3.5 mmol/L (ref 3.5–5.1)
Sodium: 141 mmol/L (ref 135–145)
Total Bilirubin: 0.4 mg/dL (ref 0.3–1.2)
Total Protein: 6.6 g/dL (ref 6.5–8.1)

## 2020-11-05 LAB — CBC WITH DIFFERENTIAL/PLATELET
Abs Immature Granulocytes: 0.04 10*3/uL (ref 0.00–0.07)
Basophils Absolute: 0 10*3/uL (ref 0.0–0.1)
Basophils Relative: 1 %
Eosinophils Absolute: 0.1 10*3/uL (ref 0.0–0.5)
Eosinophils Relative: 2 %
HCT: 32.6 % — ABNORMAL LOW (ref 36.0–46.0)
Hemoglobin: 10.8 g/dL — ABNORMAL LOW (ref 12.0–15.0)
Immature Granulocytes: 1 %
Lymphocytes Relative: 13 %
Lymphs Abs: 0.7 10*3/uL (ref 0.7–4.0)
MCH: 31.1 pg (ref 26.0–34.0)
MCHC: 33.1 g/dL (ref 30.0–36.0)
MCV: 93.9 fL (ref 80.0–100.0)
Monocytes Absolute: 0.6 10*3/uL (ref 0.1–1.0)
Monocytes Relative: 10 %
Neutro Abs: 4.1 10*3/uL (ref 1.7–7.7)
Neutrophils Relative %: 73 %
Platelets: 224 10*3/uL (ref 150–400)
RBC: 3.47 MIL/uL — ABNORMAL LOW (ref 3.87–5.11)
RDW: 18 % — ABNORMAL HIGH (ref 11.5–15.5)
WBC: 5.5 10*3/uL (ref 4.0–10.5)
nRBC: 0 % (ref 0.0–0.2)

## 2020-11-05 MED ORDER — SODIUM CHLORIDE 0.9 % IV SOLN
Freq: Once | INTRAVENOUS | Status: AC
Start: 2020-11-05 — End: 2020-11-05
  Filled 2020-11-05: qty 250

## 2020-11-05 MED ORDER — SODIUM CHLORIDE 0.9% FLUSH
10.0000 mL | INTRAVENOUS | Status: DC | PRN
  Administered 2020-11-05: 10 mL via INTRAVENOUS
  Filled 2020-11-05: qty 10

## 2020-11-05 MED ORDER — SODIUM CHLORIDE 0.9 % IV SOLN
80.0000 mg/m2 | Freq: Once | INTRAVENOUS | Status: AC
  Administered 2020-11-05: 192 mg via INTRAVENOUS
  Filled 2020-11-05: qty 32

## 2020-11-05 MED ORDER — HEPARIN SOD (PORK) LOCK FLUSH 100 UNIT/ML IV SOLN
500.0000 [IU] | Freq: Once | INTRAVENOUS | Status: AC | PRN
  Administered 2020-11-05: 500 [IU]
  Filled 2020-11-05: qty 5

## 2020-11-05 MED ORDER — FAMOTIDINE 20 MG IN NS 100 ML IVPB
20.0000 mg | Freq: Once | INTRAVENOUS | Status: AC
  Administered 2020-11-05: 20 mg via INTRAVENOUS

## 2020-11-05 MED ORDER — POTASSIUM CHLORIDE CRYS ER 20 MEQ PO TBCR
40.0000 meq | EXTENDED_RELEASE_TABLET | Freq: Every day | ORAL | 4 refills | Status: DC
  Filled 2020-11-05: qty 90, 45d supply, fill #0
  Filled 2020-11-05: qty 180, 90d supply, fill #0
  Filled 2020-12-17: qty 90, 45d supply, fill #1
  Filled 2021-10-30: qty 90, 45d supply, fill #2

## 2020-11-05 MED ORDER — DOXYCYCLINE HYCLATE 100 MG PO TABS
100.0000 mg | ORAL_TABLET | Freq: Every day | ORAL | 2 refills | Status: DC
  Filled 2020-11-05 (×2): qty 30, 30d supply, fill #0
  Filled 2020-11-28: qty 30, 30d supply, fill #1
  Filled 2020-12-31: qty 30, 30d supply, fill #2

## 2020-11-05 MED ORDER — DIPHENHYDRAMINE HCL 50 MG/ML IJ SOLN
INTRAMUSCULAR | Status: AC
  Filled 2020-11-05: qty 1

## 2020-11-05 MED ORDER — FUROSEMIDE 20 MG PO TABS
20.0000 mg | ORAL_TABLET | Freq: Every day | ORAL | 6 refills | Status: DC
  Filled 2020-11-05 (×2): qty 90, 90d supply, fill #0
  Filled 2021-02-27: qty 90, 90d supply, fill #1
  Filled 2021-06-05: qty 90, 90d supply, fill #2
  Filled 2021-09-09: qty 90, 90d supply, fill #3

## 2020-11-05 MED ORDER — OMEPRAZOLE 40 MG PO CPDR
40.0000 mg | DELAYED_RELEASE_CAPSULE | Freq: Every day | ORAL | 3 refills | Status: DC
  Filled 2020-11-05: qty 90, 90d supply, fill #0
  Filled 2020-11-05: qty 30, 30d supply, fill #0
  Filled 2020-12-17: qty 30, 30d supply, fill #1
  Filled 2021-01-21: qty 30, 30d supply, fill #2
  Filled 2021-04-08: qty 30, 30d supply, fill #3

## 2020-11-05 MED ORDER — SODIUM CHLORIDE 0.9 % IV SOLN
10.0000 mg | Freq: Once | INTRAVENOUS | Status: AC
  Administered 2020-11-05: 10 mg via INTRAVENOUS
  Filled 2020-11-05: qty 10

## 2020-11-05 MED ORDER — DIPHENHYDRAMINE HCL 50 MG/ML IJ SOLN
25.0000 mg | Freq: Once | INTRAMUSCULAR | Status: AC
  Administered 2020-11-05: 25 mg via INTRAVENOUS

## 2020-11-05 MED ORDER — SODIUM CHLORIDE 0.9% FLUSH
10.0000 mL | INTRAVENOUS | Status: DC | PRN
  Administered 2020-11-05: 10 mL
  Filled 2020-11-05: qty 10

## 2020-11-05 MED ORDER — FAMOTIDINE 20 MG IN NS 100 ML IVPB
INTRAVENOUS | Status: AC
  Filled 2020-11-05: qty 100

## 2020-11-05 MED FILL — Levothyroxine Sodium Tab 137 MCG: ORAL | 90 days supply | Qty: 90 | Fill #0 | Status: AC

## 2020-11-05 MED FILL — Levothyroxine Sodium Tab 137 MCG: ORAL | 90 days supply | Qty: 90 | Fill #0 | Status: CN

## 2020-11-05 MED FILL — Sertraline HCl Tab 100 MG: ORAL | 30 days supply | Qty: 30 | Fill #2 | Status: AC

## 2020-11-05 NOTE — Patient Instructions (Signed)
[facility]  Discharge Instructions: Thank you for choosing [facility] to provide your oncology and hematology care.   If you have a lab appointment with the [facility], please go directly to the [facility] and check in at the registration area.   Wear comfortable clothing and clothing appropriate for easy access to any Portacath or PICC line.   We strive to give you quality time with your provider. You may need to reschedule your appointment if you arrive late (15 or more minutes).  Arriving late affects you and other patients whose appointments are after yours.  Also, if you miss three or more appointments without notifying the office, you may be dismissed from the clinic at the provider's discretion.      For prescription refill requests, have your pharmacy contact our office and allow 72 hours for refills to be completed.    Today you received the following chemotherapy and/or immunotherapy agents Taxol      To help prevent nausea and vomiting after your treatment, we encourage you to take your nausea medication as directed.  BELOW ARE SYMPTOMS THAT SHOULD BE REPORTED IMMEDIATELY: *FEVER GREATER THAN 100.4 F (38 C) OR HIGHER *CHILLS OR SWEATING *NAUSEA AND VOMITING THAT IS NOT CONTROLLED WITH YOUR NAUSEA MEDICATION *UNUSUAL SHORTNESS OF BREATH *UNUSUAL BRUISING OR BLEEDING *URINARY PROBLEMS (pain or burning when urinating, or frequent urination) *BOWEL PROBLEMS (unusual diarrhea, constipation, pain near the anus) TENDERNESS IN MOUTH AND THROAT WITH OR WITHOUT PRESENCE OF ULCERS (sore throat, sores in mouth, or a toothache) UNUSUAL RASH, SWELLING OR PAIN  UNUSUAL VAGINAL DISCHARGE OR ITCHING   Items with * indicate a potential emergency and should be followed up as soon as possible or go to the Emergency Department if any problems should occur.  Please show the CHEMOTHERAPY ALERT CARD or IMMUNOTHERAPY ALERT CARD at check-in to the  Emergency Department and triage nurse.  Should you have questions after your visit or need to cancel or reschedule your appointment, please contact [facility]  Dept: [phone]  and follow the prompts.  Office hours are 8:00 a.m. to 4:30 p.m. Monday - Friday. Please note that voicemails left after 4:00 p.m. may not be returned until the following business day.  We are closed weekends and major holidays. You have access to a nurse at all times for urgent questions. Please call the main number to the clinic Dept: [phone] and follow the prompts.   For any non-urgent questions, you may also contact your provider using MyChart. We now offer e-Visits for anyone 12 and older to request care online for non-urgent symptoms. For details visit [email or web address].   Also download the MyChart app! Go to the app store, search "MyChart", open the app, select [facility], and log in with your MyChart username and password.  Due to Covid, a mask is required upon entering the hospital/clinic. If you do not have a mask, one will be given to you upon arrival. For doctor visits, patients may have 1 support person aged 46 or older with them. For treatment visits, patients cannot have anyone with them due to current Covid guidelines and our immunocompromised population.   Paclitaxel injection What is this medication? PACLITAXEL (PAK li TAX el) is a chemotherapy drug. It targets fast dividing cells, like cancer cells, and causes these cells to die. This medicine is used to treat ovarian cancer, breast cancer, lung cancer, Kaposi's sarcoma, andother cancers. This medicine may be  used for other purposes; ask your health care provider orpharmacist if you have questions. COMMON BRAND NAME(S): Onxol, Taxol What should I tell my care team before I take this medication? They need to know if you have any of these conditions: history of irregular heartbeat liver disease low blood  counts, like low white cell, platelet, or red cell counts lung or breathing disease, like asthma tingling of the fingers or toes, or other nerve disorder an unusual or allergic reaction to paclitaxel, alcohol, polyoxyethylated castor oil, other chemotherapy, other medicines, foods, dyes, or preservatives pregnant or trying to get pregnant breast-feeding How should I use this medication? This drug is given as an infusion into a vein. It is administered in a hospitalor clinic by a specially trained health care professional. Talk to your pediatrician regarding the use of this medicine in children.Special care may be needed. Overdosage: If you think you have taken too much of this medicine contact apoison control center or emergency room at once. NOTE: This medicine is only for you. Do not share this medicine with others. What if I miss a dose? It is important not to miss your dose. Call your doctor or health careprofessional if you are unable to keep an appointment. What may interact with this medication? Do not take this medicine with any of the following medications: live virus vaccines This medicine may also interact with the following medications: antiviral medicines for hepatitis, HIV or AIDS certain antibiotics like erythromycin and clarithromycin certain medicines for fungal infections like ketoconazole and itraconazole certain medicines for seizures like carbamazepine, phenobarbital, phenytoin gemfibrozil nefazodone rifampin St. John's wort This list may not describe all possible interactions. Give your health care provider a list of all the medicines, herbs, non-prescription drugs, or dietary supplements you use. Also tell them if you smoke, drink alcohol, or use illegaldrugs. Some items may interact with your medicine. What should I watch for while using this medication? Your condition will be monitored carefully while you are receiving this medicine. You will need important blood  work done while you are taking thismedicine. This medicine can cause serious allergic reactions. To reduce your risk you will need to take other medicine(s) before treatment with this medicine. If you experience allergic reactions like skin rash, itching or hives, swelling of theface, lips, or tongue, tell your doctor or health care professional right away. In some cases, you may be given additional medicines to help with side effects.Follow all directions for their use. This drug may make you feel generally unwell. This is not uncommon, as chemotherapy can affect healthy cells as well as cancer cells. Report any side effects. Continue your course of treatment even though you feel ill unless yourdoctor tells you to stop. Call your doctor or health care professional for advice if you get a fever, chills or sore throat, or other symptoms of a cold or flu. Do not treat yourself. This drug decreases your body's ability to fight infections. Try toavoid being around people who are sick. This medicine may increase your risk to bruise or bleed. Call your doctor orhealth care professional if you notice any unusual bleeding. Be careful brushing and flossing your teeth or using a toothpick because you may get an infection or bleed more easily. If you have any dental work done,tell your dentist you are receiving this medicine. Avoid taking products that contain aspirin, acetaminophen, ibuprofen, naproxen, or ketoprofen unless instructed by your doctor. These medicines may hide afever. Do not become pregnant while taking this medicine.  Women should inform their doctor if they wish to become pregnant or think they might be pregnant. There is a potential for serious side effects to an unborn child. Talk to your health care professional or pharmacist for more information. Do not breast-feed aninfant while taking this medicine. Men are advised not to father a child while receiving this medicine. This product may contain  alcohol. Ask your pharmacist or healthcare provider if this medicine contains alcohol. Be sure to tell all healthcare providers you are taking this medicine. Certain medicines, like metronidazole and disulfiram, can cause an unpleasant reaction when taken with alcohol. The reaction includes flushing, headache, nausea, vomiting, sweating, and increased thirst. Thereaction can last from 30 minutes to several hours. What side effects may I notice from receiving this medication? Side effects that you should report to your doctor or health care professionalas soon as possible: allergic reactions like skin rash, itching or hives, swelling of the face, lips, or tongue breathing problems changes in vision fast, irregular heartbeat high or low blood pressure mouth sores pain, tingling, numbness in the hands or feet signs of decreased platelets or bleeding - bruising, pinpoint red spots on the skin, black, tarry stools, blood in the urine signs of decreased red blood cells - unusually weak or tired, feeling faint or lightheaded, falls signs of infection - fever or chills, cough, sore throat, pain or difficulty passing urine signs and symptoms of liver injury like dark yellow or brown urine; general ill feeling or flu-like symptoms; light-colored stools; loss of appetite; nausea; right upper belly pain; unusually weak or tired; yellowing of the eyes or skin swelling of the ankles, feet, hands unusually slow heartbeat Side effects that usually do not require medical attention (report to yourdoctor or health care professional if they continue or are bothersome): diarrhea hair loss loss of appetite muscle or joint pain nausea, vomiting pain, redness, or irritation at site where injected tiredness This list may not describe all possible side effects. Call your doctor for medical advice about side effects. You may report side effects to FDA at1-800-FDA-1088. Where should I keep my medication? This drug is  given in a hospital or clinic and will not be stored at home. NOTE: This sheet is a summary. It may not cover all possible information. If you have questions about this medicine, talk to your doctor, pharmacist, orhealth care provider.  2022 [employer]vier/Gold Standard (2019-03-29 13:37:23)

## 2020-11-05 NOTE — Patient Instructions (Signed)

## 2020-11-06 LAB — CANCER ANTIGEN 27.29: CA 27.29: 103 U/mL — ABNORMAL HIGH (ref 0.0–38.6)

## 2020-11-12 ENCOUNTER — Inpatient Hospital Stay: Payer: 59

## 2020-11-14 ENCOUNTER — Other Ambulatory Visit (HOSPITAL_COMMUNITY): Payer: Self-pay

## 2020-11-14 DIAGNOSIS — I10 Essential (primary) hypertension: Secondary | ICD-10-CM | POA: Diagnosis not present

## 2020-11-14 DIAGNOSIS — E890 Postprocedural hypothyroidism: Secondary | ICD-10-CM | POA: Diagnosis not present

## 2020-11-14 DIAGNOSIS — C50919 Malignant neoplasm of unspecified site of unspecified female breast: Secondary | ICD-10-CM | POA: Diagnosis not present

## 2020-11-14 DIAGNOSIS — R7301 Impaired fasting glucose: Secondary | ICD-10-CM | POA: Diagnosis not present

## 2020-11-14 MED ORDER — LEVOTHYROXINE SODIUM 150 MCG PO TABS
150.0000 ug | ORAL_TABLET | Freq: Every morning | ORAL | 4 refills | Status: DC
  Filled 2020-11-14: qty 90, 90d supply, fill #0

## 2020-11-19 ENCOUNTER — Other Ambulatory Visit (HOSPITAL_COMMUNITY): Payer: Self-pay

## 2020-11-19 ENCOUNTER — Other Ambulatory Visit: Payer: Self-pay

## 2020-11-19 ENCOUNTER — Inpatient Hospital Stay: Payer: 59 | Attending: Oncology

## 2020-11-19 ENCOUNTER — Inpatient Hospital Stay: Payer: 59

## 2020-11-19 VITALS — BP 148/74 | HR 79 | Temp 98.3°F | Resp 20

## 2020-11-19 DIAGNOSIS — C7951 Secondary malignant neoplasm of bone: Secondary | ICD-10-CM | POA: Insufficient documentation

## 2020-11-19 DIAGNOSIS — Z170 Estrogen receptor positive status [ER+]: Secondary | ICD-10-CM

## 2020-11-19 DIAGNOSIS — C7800 Secondary malignant neoplasm of unspecified lung: Secondary | ICD-10-CM | POA: Insufficient documentation

## 2020-11-19 DIAGNOSIS — C50211 Malignant neoplasm of upper-inner quadrant of right female breast: Secondary | ICD-10-CM | POA: Diagnosis not present

## 2020-11-19 DIAGNOSIS — C787 Secondary malignant neoplasm of liver and intrahepatic bile duct: Secondary | ICD-10-CM

## 2020-11-19 DIAGNOSIS — Z79810 Long term (current) use of selective estrogen receptor modulators (SERMs): Secondary | ICD-10-CM | POA: Insufficient documentation

## 2020-11-19 DIAGNOSIS — C50112 Malignant neoplasm of central portion of left female breast: Secondary | ICD-10-CM

## 2020-11-19 DIAGNOSIS — Z975 Presence of (intrauterine) contraceptive device: Secondary | ICD-10-CM | POA: Diagnosis not present

## 2020-11-19 DIAGNOSIS — Z5111 Encounter for antineoplastic chemotherapy: Secondary | ICD-10-CM | POA: Insufficient documentation

## 2020-11-19 DIAGNOSIS — Z923 Personal history of irradiation: Secondary | ICD-10-CM | POA: Diagnosis not present

## 2020-11-19 DIAGNOSIS — Z95828 Presence of other vascular implants and grafts: Secondary | ICD-10-CM

## 2020-11-19 DIAGNOSIS — Z9013 Acquired absence of bilateral breasts and nipples: Secondary | ICD-10-CM | POA: Insufficient documentation

## 2020-11-19 LAB — CBC WITH DIFFERENTIAL/PLATELET
Abs Immature Granulocytes: 0.03 10*3/uL (ref 0.00–0.07)
Basophils Absolute: 0 10*3/uL (ref 0.0–0.1)
Basophils Relative: 1 %
Eosinophils Absolute: 0.1 10*3/uL (ref 0.0–0.5)
Eosinophils Relative: 3 %
HCT: 33.1 % — ABNORMAL LOW (ref 36.0–46.0)
Hemoglobin: 10.9 g/dL — ABNORMAL LOW (ref 12.0–15.0)
Immature Granulocytes: 1 %
Lymphocytes Relative: 12 %
Lymphs Abs: 0.6 10*3/uL — ABNORMAL LOW (ref 0.7–4.0)
MCH: 31.1 pg (ref 26.0–34.0)
MCHC: 32.9 g/dL (ref 30.0–36.0)
MCV: 94.6 fL (ref 80.0–100.0)
Monocytes Absolute: 0.5 10*3/uL (ref 0.1–1.0)
Monocytes Relative: 10 %
Neutro Abs: 3.5 10*3/uL (ref 1.7–7.7)
Neutrophils Relative %: 73 %
Platelets: 279 10*3/uL (ref 150–400)
RBC: 3.5 MIL/uL — ABNORMAL LOW (ref 3.87–5.11)
RDW: 18.3 % — ABNORMAL HIGH (ref 11.5–15.5)
WBC: 4.8 10*3/uL (ref 4.0–10.5)
nRBC: 0 % (ref 0.0–0.2)

## 2020-11-19 LAB — COMPREHENSIVE METABOLIC PANEL
ALT: 15 U/L (ref 0–44)
AST: 14 U/L — ABNORMAL LOW (ref 15–41)
Albumin: 3.1 g/dL — ABNORMAL LOW (ref 3.5–5.0)
Alkaline Phosphatase: 136 U/L — ABNORMAL HIGH (ref 38–126)
Anion gap: 9 (ref 5–15)
BUN: 11 mg/dL (ref 6–20)
CO2: 29 mmol/L (ref 22–32)
Calcium: 9.4 mg/dL (ref 8.9–10.3)
Chloride: 104 mmol/L (ref 98–111)
Creatinine, Ser: 0.75 mg/dL (ref 0.44–1.00)
GFR, Estimated: >60 mL/min (ref >60)
Glucose, Bld: 100 mg/dL — ABNORMAL HIGH (ref 70–99)
Potassium: 3.6 mmol/L (ref 3.5–5.1)
Sodium: 142 mmol/L (ref 135–145)
Total Bilirubin: 0.4 mg/dL (ref 0.3–1.2)
Total Protein: 6.7 g/dL (ref 6.5–8.1)

## 2020-11-19 MED ORDER — FAMOTIDINE 20 MG IN NS 100 ML IVPB
INTRAVENOUS | Status: AC
  Filled 2020-11-19: qty 100

## 2020-11-19 MED ORDER — FAMOTIDINE 20 MG IN NS 100 ML IVPB
20.0000 mg | Freq: Once | INTRAVENOUS | Status: AC
  Administered 2020-11-19: 20 mg via INTRAVENOUS

## 2020-11-19 MED ORDER — DENOSUMAB 120 MG/1.7ML ~~LOC~~ SOLN
SUBCUTANEOUS | Status: AC
  Filled 2020-11-19: qty 1.7

## 2020-11-19 MED ORDER — HEPARIN SOD (PORK) LOCK FLUSH 100 UNIT/ML IV SOLN
500.0000 [IU] | Freq: Once | INTRAVENOUS | Status: AC | PRN
  Administered 2020-11-19: 500 [IU]
  Filled 2020-11-19: qty 5

## 2020-11-19 MED ORDER — DIPHENHYDRAMINE HCL 50 MG/ML IJ SOLN
INTRAMUSCULAR | Status: AC
  Filled 2020-11-19: qty 1

## 2020-11-19 MED ORDER — SODIUM CHLORIDE 0.9% FLUSH
10.0000 mL | INTRAVENOUS | Status: DC | PRN
  Administered 2020-11-19: 10 mL
  Filled 2020-11-19: qty 10

## 2020-11-19 MED ORDER — SODIUM CHLORIDE 0.9 % IV SOLN
Freq: Once | INTRAVENOUS | Status: AC
  Filled 2020-11-19: qty 250

## 2020-11-19 MED ORDER — SODIUM CHLORIDE 0.9 % IV SOLN
80.0000 mg/m2 | Freq: Once | INTRAVENOUS | Status: AC
  Administered 2020-11-19: 192 mg via INTRAVENOUS
  Filled 2020-11-19: qty 32

## 2020-11-19 MED ORDER — DEXAMETHASONE SODIUM PHOSPHATE 100 MG/10ML IJ SOLN
10.0000 mg | Freq: Once | INTRAMUSCULAR | Status: AC
  Administered 2020-11-19: 10 mg via INTRAVENOUS
  Filled 2020-11-19: qty 10

## 2020-11-19 MED ORDER — DIPHENHYDRAMINE HCL 50 MG/ML IJ SOLN
25.0000 mg | Freq: Once | INTRAMUSCULAR | Status: AC
  Administered 2020-11-19: 25 mg via INTRAVENOUS

## 2020-11-19 MED ORDER — SODIUM CHLORIDE 0.9% FLUSH
10.0000 mL | INTRAVENOUS | Status: AC | PRN
Start: 2020-11-19 — End: 2020-11-19
  Administered 2020-11-19: 10 mL
  Filled 2020-11-19: qty 10

## 2020-11-19 MED ORDER — DENOSUMAB 120 MG/1.7ML ~~LOC~~ SOLN
120.0000 mg | Freq: Once | SUBCUTANEOUS | Status: AC
  Administered 2020-11-19: 120 mg via SUBCUTANEOUS

## 2020-11-19 MED FILL — Rosuvastatin Calcium Tab 5 MG: ORAL | 30 days supply | Qty: 30 | Fill #2 | Status: AC

## 2020-11-19 NOTE — Patient Instructions (Signed)
[facility]  Discharge Instructions: Thank you for choosing [facility] to provide your oncology and hematology care.   If you have a lab appointment with the [facility], please go directly to the [facility] and check in at the registration area.   Wear comfortable clothing and clothing appropriate for easy access to any Portacath or PICC line.   We strive to give you quality time with your provider. You may need to reschedule your appointment if you arrive late (15 or more minutes).  Arriving late affects you and other patients whose appointments are after yours.  Also, if you miss three or more appointments without notifying the office, you may be dismissed from the clinic at the provider's discretion.      For prescription refill requests, have your pharmacy contact our office and allow 72 hours for refills to be completed.    Today you received the following chemotherapy and/or immunotherapy agents : Taxol, Xgeva   To help prevent nausea and vomiting after your treatment, we encourage you to take your nausea medication as directed.  BELOW ARE SYMPTOMS THAT SHOULD BE REPORTED IMMEDIATELY: *FEVER GREATER THAN 100.4 F (38 C) OR HIGHER *CHILLS OR SWEATING *NAUSEA AND VOMITING THAT IS NOT CONTROLLED WITH YOUR NAUSEA MEDICATION *UNUSUAL SHORTNESS OF BREATH *UNUSUAL BRUISING OR BLEEDING *URINARY PROBLEMS (pain or burning when urinating, or frequent urination) *BOWEL PROBLEMS (unusual diarrhea, constipation, pain near the anus) TENDERNESS IN MOUTH AND THROAT WITH OR WITHOUT PRESENCE OF ULCERS (sore throat, sores in mouth, or a toothache) UNUSUAL RASH, SWELLING OR PAIN  UNUSUAL VAGINAL DISCHARGE OR ITCHING   Items with * indicate a potential emergency and should be followed up as soon as possible or go to the Emergency Department if any problems should occur.  Please show the CHEMOTHERAPY ALERT CARD or IMMUNOTHERAPY ALERT CARD at check-in to  the Emergency Department and triage nurse.  Should you have questions after your visit or need to cancel or reschedule your appointment, please contact [facility]  Dept: [phone]  and follow the prompts.  Office hours are 8:00 a.m. to 4:30 p.m. Monday - Friday. Please note that voicemails left after 4:00 p.m. may not be returned until the following business day.  We are closed weekends and major holidays. You have access to a nurse at all times for urgent questions. Please call the main number to the clinic Dept: [phone] and follow the prompts.   For any non-urgent questions, you may also contact your provider using MyChart. We now offer e-Visits for anyone 58 and older to request care online for non-urgent symptoms. For details visit [email or web address].   Also download the MyChart app! Go to the app store, search "MyChart", open the app, select [facility], and log in with your MyChart username and password.  Due to Covid, a mask is required upon entering the hospital/clinic. If you do not have a mask, one will be given to you upon arrival. For doctor visits, patients may have 1 support person aged 52 or older with them. For treatment visits, patients cannot have anyone with them due to current Covid guidelines and our immunocompromised population.

## 2020-11-20 LAB — CANCER ANTIGEN 27.29: CA 27.29: 100.5 U/mL — ABNORMAL HIGH (ref 0.0–38.6)

## 2020-11-26 ENCOUNTER — Encounter (HOSPITAL_COMMUNITY): Payer: Self-pay

## 2020-11-26 ENCOUNTER — Other Ambulatory Visit: Payer: Self-pay

## 2020-11-26 ENCOUNTER — Ambulatory Visit (HOSPITAL_COMMUNITY)
Admission: RE | Admit: 2020-11-26 | Discharge: 2020-11-26 | Disposition: A | Discharge status: Home / Self Care | Payer: 59 | Source: Ambulatory Visit | Attending: Oncology | Admitting: Oncology

## 2020-11-26 DIAGNOSIS — Z170 Estrogen receptor positive status [ER+]: Secondary | ICD-10-CM | POA: Diagnosis not present

## 2020-11-26 DIAGNOSIS — R911 Solitary pulmonary nodule: Secondary | ICD-10-CM | POA: Diagnosis not present

## 2020-11-26 DIAGNOSIS — C50112 Malignant neoplasm of central portion of left female breast: Secondary | ICD-10-CM | POA: Insufficient documentation

## 2020-11-26 DIAGNOSIS — C7951 Secondary malignant neoplasm of bone: Secondary | ICD-10-CM | POA: Diagnosis not present

## 2020-11-26 DIAGNOSIS — C50211 Malignant neoplasm of upper-inner quadrant of right female breast: Secondary | ICD-10-CM | POA: Insufficient documentation

## 2020-11-26 DIAGNOSIS — R918 Other nonspecific abnormal finding of lung field: Secondary | ICD-10-CM | POA: Diagnosis not present

## 2020-11-26 MED ORDER — HEPARIN SOD (PORK) LOCK FLUSH 100 UNIT/ML IV SOLN: 500.0000 [IU] | Freq: Once | INTRAVENOUS | Status: AC

## 2020-11-26 MED ORDER — HEPARIN SOD (PORK) LOCK FLUSH 100 UNIT/ML IV SOLN
INTRAVENOUS | Status: AC
  Administered 2020-11-26: 500 [IU] via INTRAVENOUS
  Filled 2020-11-26: qty 5

## 2020-11-26 MED ORDER — IOHEXOL 350 MG/ML SOLN
100.0000 mL | Freq: Once | INTRAVENOUS | Status: AC | PRN
  Administered 2020-11-26: 60 mL via INTRAVENOUS

## 2020-11-27 ENCOUNTER — Telehealth: Payer: Self-pay | Admitting: Adult Health

## 2020-11-27 NOTE — Telephone Encounter (Signed)
Called patient and gave her news about her scans.  She will return on 12/03/2020 for f/u with Dr. [name].  Wilber Bihari, NP

## 2020-11-28 ENCOUNTER — Other Ambulatory Visit (HOSPITAL_COMMUNITY): Payer: Self-pay

## 2020-11-28 ENCOUNTER — Other Ambulatory Visit: Payer: Self-pay | Admitting: Oncology

## 2020-11-28 MED ORDER — PREGABALIN 100 MG PO CAPS: 100.0000 mg | ORAL_CAPSULE | Freq: Three times a day (TID) | ORAL | 0 refills | Status: DC

## 2020-11-29 ENCOUNTER — Other Ambulatory Visit: Payer: Self-pay | Admitting: Oncology

## 2020-11-29 ENCOUNTER — Other Ambulatory Visit (HOSPITAL_COMMUNITY): Payer: Self-pay

## 2020-11-29 MED FILL — Sertraline HCl Tab 100 MG: ORAL | 30 days supply | Qty: 30 | Fill #3 | Status: AC

## 2020-11-30 ENCOUNTER — Other Ambulatory Visit (HOSPITAL_COMMUNITY): Payer: Self-pay

## 2020-12-01 ENCOUNTER — Encounter: Payer: Self-pay | Admitting: Oncology

## 2020-12-01 NOTE — Telephone Encounter (Signed)
Refilled on 11/28/2020 per chart review.

## 2020-12-02 ENCOUNTER — Other Ambulatory Visit (HOSPITAL_COMMUNITY): Payer: Self-pay

## 2020-12-02 ENCOUNTER — Other Ambulatory Visit: Payer: Self-pay | Admitting: Oncology

## 2020-12-02 MED ORDER — PREGABALIN 100 MG PO CAPS
100.0000 mg | ORAL_CAPSULE | Freq: Three times a day (TID) | ORAL | 0 refills | Status: DC
  Filled 2020-12-02: qty 90, 30d supply, fill #0

## 2020-12-02 MED ORDER — PREGABALIN 100 MG PO CAPS: 100.0000 mg | ORAL_CAPSULE | Freq: Three times a day (TID) | ORAL | 0 refills | Status: DC

## 2020-12-02 NOTE — Progress Notes (Signed)
[place]  [phone] [phone]    ID: Debbie [name] DOB: 04/05/1966  MR#: [id number]  GSU#:[id number]  Patient Care Team: Kelton Pillar, MD as PCP - General (Family Medicine) Jacelyn Pi, MD as Consulting Physician (Endocrinology) Jovita Kussmaul, MD as Consulting Physician (General Surgery) Jenaro Souder, Virgie Dad, MD as Consulting Physician (Oncology) [name], MD as Attending Physician (Radiation Oncology) Belva Crome, MD as Consulting Physician (Cardiology) Crissie Reese, MD as Consulting Physician (Plastic Surgery) [name], [name], NP as Nurse Practitioner (Hematology and Oncology) Lajoyce Lauber, DMD (Dentistry) OTHER MD:   CHIEF COMPLAINT: Estrogen receptor positive stage IV  breast cancer (s/p bilateral mastectomies)  CURRENT TREATMENT: paclitaxel; denosumab/Xgeva   INTERVAL HISTORY: Debbie [name] returns today for follow up and treatment of her stage IV breast cancer.  Since her last visit, she underwent chest CT on 11/26/2020 showing: small bilateral pulmonary nodules/metastases have essentially resolved; mediastinal/right perihilar nodal metastases are grossly unchanged; hepatic metastases are poorly visualized but favored to be improved; multifocal osseous metastases are grossly unchanged.  She was started on paclitaxel on 09/10/2020, initially 3 weeks on 1 week off.. She now receives treatments days 1 and 15 of each 28-day cycle. Today is day 1, cycle 4.  She continues on Xgeva every 4 weeks, with her most recent dose on 11/19/2020.  She has no side effects from this that she is aware of  We are following her CA 27-29 which is showing significant trend: Lab Results  Component Value Date   CA2729 100.5 (H) 11/19/2020   CA2729 103.0 (H) 11/05/2020   CA2729 118.1 (H) 10/22/2020   CA2729 132.4 (H) 10/15/2020   CA2729 150.3 (H) 10/08/2020    REVIEW OF SYSTEMS: Debbie [name] tells me she saw Dr. Chalmers Cater and was found to  be hypothyroid.  Her Synthroid was increased and she is feeling considerably better, with more energy and less mental fog.  Debbie [name] has had no peripheral neuropathy symptoms which is remarkable given her baseline diabetes issues.  Her appetite goes up and down, she is drinking more water now and says that sodas do not taste good.  She does have low back pain with walking.  Some days she can do more than others.  She takes Advil for this as needed.  Sometimes she gets ankle swelling and uses a water pill for that.  She sleeps okay.  She can alternate between constipation and diarrhea and she knows how to manage that.  She continues to work 40 hours a week.  A detailed review of systems today was otherwise stable   COVID 19 VACCINATION STATUS: Status post Pfizer x2, with booster January 2021   BREAST CANCER HISTORY: From the original intake note:  Debbie [name] had screening mammography with tomosynthesis at [facility] 10/02/2015. This showed 2 possible masses in the right breast as well as some suspicious calcifications. The left breast was unremarkable. On 10/15/2015 she underwent right diagnostic mammography with tomography and right breast ultrasonography. The breast density was category B. Spot magnification views confirmed an area of pleomorphic calcifications measuring up to 5 cm in the upper half of the breast. Also in the upper outer right breast there were adjacent low-density masses without distortion or calcifications. On physical exam there was no palpable abnormality.   Targeted ultrasound of the right breast found the 2 "masses" in the right upper quadrant were benign cysts measuring 0.8 and 0.5 cm respectively. Biopsy of the area of calcification on 10/15/2015 showed ([id number]) invasive  ductal carcinoma, E-cadherin positive, grade 2, estrogen receptor 90% positive, progesterone receptor 50% positive, both with strong staining intensity, with an MIB-1 of 70%, and no HER-2 amplification,  the signals ratio being 1.18 and the number per cell 2.60.   Her subsequent history is as detailed below    PAST MEDICAL HISTORY: Past Medical History:  Diagnosis Date   Anxiety    Arthritis    Bone metastasis ([place])    Breast cancer of upper-inner quadrant of right female breast [facility]) oncologist-  dr [name]--- 04/ 2019 ER/PR+ Stage IV w/ metastatic disease to liver, total spine, lung, bone   dx 06/ 2017  right breast invasive ductal carcinoma, Stage IIB, ER/PR + (pT2 pN1), grade 1-- s/p  right breast lumpecotmy w/ sln bx's and bilateral breast reduction 11-28-2015/  left breast with reduction , dx invasive ductal ca, Grade 1 (pT1cpNX) ER/PR positive/  adjuvant radiation completed 03-27-2016 to right chest wall, 2018  reclassifiec Stage IIA   Cancer, metastatic to liver ([name])    Chronic back pain    Constipation    Depression    Family history of adverse reaction to anesthesia     mother has Copd was kept on vent    Headache    Heart murmur    History of hyperthyroidism    s/p  RAI   History of radiation therapy 02/11/16- 03/27/16   Right Chest Wall 50.4 Gy in 28 fractions, Right Chest Wall Boost 10 Gy in 5 fractions.    Hyperlipidemia    Hypertension    Hypothyroidism, postradioiodine therapy    endocrinoloigst-  dr balan--  2004  s/p  RAI   Menorrhagia 2011   Multiple pulmonary nodules    secondary  metastatic   PONV (postoperative nausea and vomiting)    patch helps    SUI (stress urinary incontinence, female)    Wears contact lenses    Wears dentures    full upper and lower partial    PAST SURGICAL HISTORY: Past Surgical History:  Procedure Laterality Date   BREAST LUMPECTOMY WITH NEEDLE LOCALIZATION AND AXILLARY SENTINEL LYMPH NODE BX Right 11/28/2015   Procedure: RIGHT BREAST LUMPECTOMY WITH NEEDLE LOCALIZATION AND AXILLARY SENTINEL LYMPH NODE BX;  Surgeon: Autumn Messing III, MD;  Location: [place];  Service: General;  Laterality: Right;   BREAST REDUCTION SURGERY  Bilateral 11/28/2015   Procedure: MAMMARY REDUCTION  (BREAST) BILATERAL;  Surgeon: Crissie Reese, MD;  Location: [place];  Service: Plastics;  Laterality: Bilateral;   CESAREAN SECTION  01/12/1993   COLONOSCOPY     Dental surgeries     FEMUR IM NAIL Left 01/28/2018   Procedure: INTRAMEDULLARY (IM) NAIL FEMORAL;  Surgeon: Nicholes Stairs, MD;  Location: [place];  Service: Orthopedics;  Laterality: Left;   HYSTEROSCOPY N/A 09/02/2017   Procedure: HYSTEROSCOPY  to remove IUD;  Surgeon: Eldred Manges, MD;  Location: [place];  Service: Gynecology;  Laterality: N/A;   IR IMAGING GUIDED PORT INSERTION  09/16/2020   Liver biospy     MASTECTOMY     MASTECTOMY W/ SENTINEL NODE BIOPSY Bilateral 12/26/2015   Procedure: BILATERAL MASTECTOMY WITH LEFT SENTINEL LYMPH NODE BIOPSY;  Surgeon: Autumn Messing III, MD;  Location: [place];  Service: General;  Laterality: Bilateral;   Removal of IUD     TUBAL LIGATION Bilateral 1995    FAMILY HISTORY: Family History  Problem Relation Age of Onset   Diabetes Mother    Hypertension Mother    Hypertension  Father    Diabetes Maternal Grandmother    Hypertension Sister    Cancer Paternal Grandmother        colon   Colon cancer Paternal Grandmother    Esophageal cancer Neg Hx    Stomach cancer Neg Hx    Rectal cancer Neg Hx   The patient's parents are still living, in their early 53s as of June 2017. The patient has one brother, 2 sisters. On the maternal side there is a history of uterine and prostate cancer. On the paternal side there is a history of colon cancer and possibly uterine cancer. There is no history of breast or ovarian cancer in the family.    GYNECOLOGIC HISTORY:  Patient's last menstrual period was 12/19/2015.  Menarche age 75. She still having regular periods. She is GX P1, first pregnancy age 74. She used oral contraceptives for a few years remotely, with no complications. She is currently on a Mirena IUD and also is status post  bilateral tubal ligation.   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 Her husband Debbie [name] is a Administrator.  They are both taking appropriate precautions at work.  Their son Debbie [name], age 9, is a radio DJ.  He no longer lives with the patient.  The patient has no grandchildren. She is not a Ambulance person.   ADVANCED DIRECTIVES: In the absence of any documents to the contrary her husband is her healthcare power of attorney   HEALTH MAINTENANCE: Social History   Tobacco Use   Smoking status: Never   Smokeless tobacco: Never  Vaping Use   Vaping Use: Never used  Substance Use Topics   Alcohol use: No   Drug use: No     Colonoscopy: January 2016   PAP: January 2016   Bone density: August 2017 showed a T score of +0.7 normal  Lipid panel:  Allergies  Allergen Reactions   Ace Inhibitors Cough   Atorvastatin Other (See Comments)    Joint pain   Simvastatin Other (See Comments)    Joint pain    Current Outpatient Medications  Medication Sig Dispense Refill   acetaminophen (TYLENOL) 500 MG tablet Take 1,000 mg by mouth every 6 (six) hours as needed.     blood glucose meter kit and supplies KIT Dispense based on patient and insurance preference. Use up to four times daily as directed. (FOR ICD-9 250.00, 250.01). 1 each 0   Calcium-Magnesium 250-125 MG TABS Take 1 tablet by mouth daily. 120 each 4   cetirizine (ZYRTEC) 10 MG tablet Take 10 mg by mouth daily as needed for allergies.      chlorhexidine (PERIDEX) 0.12 % solution Use 5 mLs in the mouth or throat 2 (two) times daily as needed as directed. Do not swallow 473 mL 6   doxycycline (VIBRA-TABS) 100 MG tablet Take 1 tablet (100 mg total) by mouth daily. 30 tablet 2   Ferrous Sulfate (IRON) 325 (65 Fe) MG TABS Take 1 tablet (325 mg total) by mouth daily. 30 each 2   furosemide (LASIX) 20 MG tablet Take 1 tablet (20 mg total) by mouth daily. 90 tablet 6   glucose blood test  strip USE AS DIRECTED TO TEST UP TO FOUR TIMES DAILY (Patient taking differently: USE AS DIRECTED TO TEST UP TO FOUR TIMES DAILY) 100 strip 0   ibuprofen (ADVIL) 800 MG tablet Take 1 tablet (800 mg total) by mouth 2 (two) times daily as needed. [place]  tablet 1   Lancets (ONETOUCH ULTRASOFT) lancets Use as instructed 100 each 12   levothyroxine (SYNTHROID) 150 MCG tablet Take 1 tablet (150 mcg total) by mouth every morning on an empty stomach 90 tablet 4   lidocaine-prilocaine (EMLA) cream Apply to affected area once 30 g 3   LORazepam (ATIVAN) 1 MG tablet TAKE 1/2 TO 1 TABLET BY MOUTH AT BEDTIME AS NEEDED 30 tablet 0   losartan-hydrochlorothiazide (HYZAAR) 100-25 MG tablet TAKE 1 TABLET BY MOUTH ONCE DAILY 90 tablet 1   Multiple Vitamin (MULTIVITAMIN) tablet Take 1 tablet by mouth daily.     omega-3 acid ethyl esters (LOVAZA) 1 g capsule Take 1 g by mouth 2 (two) times daily.      omeprazole (PRILOSEC) 40 MG capsule Take 1 capsule (40 mg total) by mouth daily. 30 capsule 3   oxyCODONE (OXY IR/ROXICODONE) 5 MG immediate release tablet Take 1 tablet (5 mg total) by mouth every 4 (four) hours as needed for severe pain. 60 tablet 0   potassium chloride SA (KLOR-CON) 20 MEQ tablet Take 2 tablets (40 mEq total) by mouth daily. 90 tablet 4   pregabalin (LYRICA) 100 MG capsule Take 1 capsule (100 mg total) by mouth 3 (three) times daily. 90 capsule 0   prochlorperazine (COMPAZINE) 10 MG tablet Take 1 tablet (10 mg total) by mouth every 6 (six) hours as needed (Nausea or vomiting). 30 tablet 1   rosuvastatin (CRESTOR) 5 MG tablet TAKE 1 TABLET BY MOUTH ONCE DAILY 30 tablet 7   sertraline (ZOLOFT) 100 MG tablet TAKE 1 TABLET BY MOUTH ONCE A DAY 30 tablet 12   valACYclovir (VALTREX) 1000 MG tablet Take 1 tablet (1,000 mg total) by mouth daily. 60 tablet 6   Vitamin D, Cholecalciferol, 1000 units CAPS Take 1 tablet by mouth daily. (Patient taking differently: Take 1,000 Units by mouth daily.) 90 capsule 4   No  current facility-administered medications for this visit.    OBJECTIVE: African-American woman who appears stated age  55:   12/03/20 0828  BP: 139/67  Pulse: 83  Resp: 18  Temp: 97.6 F (36.4 C)  SpO2: 100%     Body mass index is 47.66 kg/m.    ECOG FS:1 - Symptomatic but completely ambulatory  Filed Weights   12/03/20 0828  Weight: 295 lb 4.8 oz (133.9 kg)    Sclerae unicteric, EOMs intact Wearing a mask No cervical or supraclavicular adenopathy Lungs no rales or rhonchi Heart regular rate and rhythm Abd soft, nontender, positive bowel sounds MSK no focal spinal tenderness, no upper extremity lymphedema Neuro: nonfocal, well oriented, appropriate affect Breasts: Status post bilateral mastectomies.  There is no evidence of chest wall recurrence.  Both axillae are benign   LAB RESULTS:  CMP     Component Value Date/Time   NA 142 12/03/2020 0800   NA 140 07/08/2016 0948   K 3.3 (L) 12/03/2020 0800   K 4.1 07/08/2016 0948   CL 103 12/03/2020 0800   CO2 29 12/03/2020 0800   CO2 24 07/08/2016 0948   GLUCOSE 98 12/03/2020 0800   GLUCOSE 89 07/08/2016 0948   BUN 12 12/03/2020 0800   BUN 11.9 07/08/2016 0948   CREATININE 0.85 12/03/2020 0800   CREATININE 0.84 08/22/2020 1050   CREATININE 0.9 07/08/2016 0948   CALCIUM 9.1 12/03/2020 0800   CALCIUM 10.0 07/08/2016 0948   PROT 6.8 12/03/2020 0800   PROT 7.6 07/08/2016 0948   ALBUMIN 3.1 (L) 12/03/2020 0800   ALBUMIN  3.6 07/08/2016 0948   AST 17 12/03/2020 0800   AST 16 08/22/2020 1050   AST 22 07/08/2016 0948   ALT 17 12/03/2020 0800   ALT 14 08/22/2020 1050   ALT 27 07/08/2016 0948   ALKPHOS 132 (H) 12/03/2020 0800   ALKPHOS 115 07/08/2016 0948   BILITOT 0.4 12/03/2020 0800   BILITOT 0.3 08/22/2020 1050   BILITOT 0.35 07/08/2016 0948   GFRNONAA >60 12/03/2020 0800   GFRNONAA >60 08/22/2020 1050   GFRAA >60 01/30/2020 0814   GFRAA >60 05/16/2019 0905    INo results found for: SPEP, UPEP  Lab Results   Component Value Date   WBC 5.0 12/03/2020   NEUTROABS 3.7 12/03/2020   HGB 10.6 (L) 12/03/2020   HCT 32.2 (L) 12/03/2020   MCV 93.6 12/03/2020   PLT 247 12/03/2020      Chemistry      Component Value Date/Time   NA 142 12/03/2020 0800   NA 140 07/08/2016 0948   K 3.3 (L) 12/03/2020 0800   K 4.1 07/08/2016 0948   CL 103 12/03/2020 0800   CO2 29 12/03/2020 0800   CO2 24 07/08/2016 0948   BUN 12 12/03/2020 0800   BUN 11.9 07/08/2016 0948   CREATININE 0.85 12/03/2020 0800   CREATININE 0.84 08/22/2020 1050   CREATININE 0.9 07/08/2016 0948      Component Value Date/Time   CALCIUM 9.1 12/03/2020 0800   CALCIUM 10.0 07/08/2016 0948   ALKPHOS 132 (H) 12/03/2020 0800   ALKPHOS 115 07/08/2016 0948   AST 17 12/03/2020 0800   AST 16 08/22/2020 1050   AST 22 07/08/2016 0948   ALT 17 12/03/2020 0800   ALT 14 08/22/2020 1050   ALT 27 07/08/2016 0948   BILITOT 0.4 12/03/2020 0800   BILITOT 0.3 08/22/2020 1050   BILITOT 0.35 07/08/2016 0948       No results found for: LABCA2  No components found for: LABCA125  No results for input(s): INR in the last 168 hours.  Urinalysis No results found for: COLORURINE, APPEARANCEUR, LABSPEC, PHURINE, GLUCOSEU, HGBUR, BILIRUBINUR, KETONESUR, PROTEINUR, UROBILINOGEN, NITRITE, LEUKOCYTESUR   STUDIES: CT Chest W Contrast  Result Date: 11/26/2020 CLINICAL DATA:  Metastatic breast cancer, status post bilateral mastectomy, chemotherapy in progress, XRT complete EXAM: CT CHEST WITH CONTRAST TECHNIQUE: Multidetector CT imaging of the chest was performed during intravenous contrast administration. CONTRAST:  85m OMNIPAQUE IOHEXOL 350 MG/ML SOLN COMPARISON:  09/03/2020 FINDINGS: Cardiovascular: Heart is normal in size.  No pericardial effusion. No evidence of thoracic aortic aneurysm. Right chest port terminates at the cavoatrial junction. Mediastinum/Nodes: 16 mm short axis subcarinal node (series 2/image 82), previously 17 mm. 12 mm short axis  right perihilar node (series 2/image 84), unchanged. Status post right axillary lymph node dissection. Visualized thyroid is unremarkable. Lungs/Pleura: Radiation changes in the anterior right lung apex (series 7/image 37). Prior bilateral pulmonary nodules have essentially resolved. 4 mm nodule in the inferolateral right middle lobe (series 7/image 94), unchanged, favored to reflect nodular scarring. Additional flat perifissural nodularity along the left fissure (series 7/image 86), unchanged/benign. Similar perifissural nodularity along the right major fissure measuring up to 5 mm (series 7/image 85), unchanged, favored to be benign. 4 mm subpleural vague ground-glass nodule in the posterior left upper lobe (series 7/image 83), possibly new, but not considered suspicious for metastatic disease. No pleural effusion or pneumothorax. Upper Abdomen: Hepatic metastases in the right hepatic lobe (series 2/images 163 and 169) are less conspicuous than on prior CT and  may be improved. Musculoskeletal: Status post bilateral mastectomy. Multifocal lytic/sclerotic metastases in the visualized axial and appendicular skeleton, grossly unchanged. IMPRESSION: Status post bilateral mastectomy with right axillary lymph node dissection. Small bilateral pulmonary nodules/metastases have essentially resolved. Mediastinal/right perihilar nodal metastases are grossly unchanged. Hepatic metastases are poorly visualized/evaluated but favored to be improved. Multifocal osseous metastases, grossly unchanged. Electronically Signed   By: Julian Hy M.D.   On: 11/26/2020 23:35     ELIGIBLE FOR AVAILABLE RESEARCH PROTOCOL: PALLAS, but inelegible because of Mirena IUD   ASSESSMENT: 55 y.o. Debbie [name] woman status post right breast upper inner quadrant biopsy 10/15/2015 for a clinical T2-T3 N0, stage II invasive ductal carcinoma, estrogen and progesterone receptor positive, HER-2 nonamplified, with an Mib-1 of 70%  (1) status post  right lumpectomy and sentinel lymph node sampling with oncoplastic breast reduction 11/28/2015 for a pT2 pN1, stage IIB invasive ductal carcinoma, grade 1, with negative margins (1/1 sentinel node positive)  (a) reclassified as stage IIA in the 2018 prognostic revision   (2) left reduction mammoplasty 11/28/2015 unexpectedly found a pT1c pNX invasive ductal carcinoma, grade 1, estrogen and progesterone receptor positive, HER-2 not amplified, with an MIB-1 of 3%  (3) Mammaprint from the right sided breast cancer was "low risk", predicting a 98% chance of no disease recurrence within 5 years with anti-estrogens as the only systemic therapy. It also predicts minimal to no benefit from chemotherapy  (4) status post bilateral mastectomies with bilateral sentinel lymph node sampling 12/26/2015, showing  (a) on the right, no residual carcinoma (0/3 nodes involved)  (b) on the left, focal ductal carcinoma in situ, 0.2 cm, with negative margins; (0/4 nodes involved)  (5) adjuvant radiation 02/11/16 - 03/27/16    1) Right chest wall: 50.4 Gy in 28 fractions               2) Right chest wall boost: 10 Gy in 5 fractions   (6) started on tamoxifen neoadjuvantly 10/23/2015 to allow for expected delays in definitive local treatment  (a) Mirena IUD in place  (b) tamoxifen discontinued 08/13/2017, with progression  (7) since she had 4 lymph nodes removed from each axilla but also receive radiation on the right, lab draws should preferentially be obtained from the left arm   METASTATIC DISEASE: April 2019 (8) CT scan of the abdomen and pelvis 08/04/2017, MRI of the liver and total spine MRI 08/11/2017 showed liver and bone metastases  (a) liver biopsy 09/07/2017 confirms estrogen receptor positive, progesterone receptor negative, HER-2 negative metastatic breast cancer  (b) chest CT scan 08/13/2017 shows multiple bilateral pulmonary nodules.  (c) CT of the brain with and without contrast 08/26/2017 shows no  evidence of metastatic disease to the brain  (d) CA 27-29 is informative  (9) fulvestrant started 08/17/2017  (a) palbociclib started 08/31/2017  (b) fulvestrant and palbociclib discontinued 01/04/2018 with evidence of progression in the liver  (10) denosumab/Xgeva started 09/14/2017  (a) held after 03/01/2018 dose because concern re. osteonecrosis  (b) resumed 06/09/2018  (c) 02/21/2019 dose held due to oral surgery, resumed 03/21/2019  (11) anastrozole started 01/04/2018  (a) everolimus started 01/29/2018  (b) liver MRI and bone scan December 2019 show stable disease  (c) chest CT scan on 08/02/2018 shows lung lesions resolved, bone lesions stable  (d) bone scan 09/13/2018 essentially stable  (e) liver MRI 12/08/2018 shows improvement in hepatic metastsases  (f) liver MRI and bone scan 04/13/2019 showed continuing improvement in the liver, stable bone lesions  (g) chest CT  08/01/2019 stable, no evidence of active disease ows  (12) 01/28/18 prophylactic left femur intramedullary nail surgery for impending left femur pathologic fracture  (a) palliative radiation with Dr. [name] to the left femur and left ilium from 02/16/2018 to 03/01/2018  (13) [facility] report on liver biopsy from April 2019 shows a negative PDL 1, stable MSI and proficient mismatch repair status.  The tumor is positive for the androgen receptor, and for PTEN for PIK3 CA mutations.  HER-2/neu was read as equivocal (it was negative by FISH on the pathology report).  (14) Progression noted in liver MRI on 01/11/2020; fulvestrant started on 01/30/2020  (a) alpelisib started 01/30/2020 at 300 mg daily with Metformin 500 mg daily  (b) alpelisib held on 02/07/2020 with very high blood sugars.  Metformin increased to 1 g daily  (c) alpelisib resumed at 150 mg daily on 02/13/2020  (d) liver MRI 05/06/2020 shows evidence of disease progression  (e) fulvestrant and alpelisib discontinued January 2022  (15) capecitabine started  05/22/2020 at 1.5 g twice daily, 14/7  (a) abdominal MRI 08/23/2020 shows worsening liver disease  (b) chest CT scan 09/04/2020 shows new lung metastases  (c) capecitabine discontinued April 2022  (16) paclitaxel started 09/10/2020, repeated days 1, 8, and 15 of each 28-day cycle  (A) changed to every 2 weeks beginning with cycle 3 (11/05/2020)   PLAN: Kynnedi is tolerating the every 14-day Taxol much better.  The CT scan just obtained is very encouraging and shows a good initial response.  The plan accordingly is to continue Taxol every 14 days until there is evidence of progression, or intolerable toxicities which in this setting usually means significant peripheral neuropathy.  I encouraged her to continue to avoid unnecessary calories.  For the back pain in addition to Advil she has Ultram, which she has not started to use.  She knows how to manage constipation issues.  She will return in 2 weeks for treatment and then in 4 weeks for treatment and a visit.  She knows to call for any other issues that may develop before then.  Total encounter time 25 minutes.Sarajane Jews C. Neera Teng, MD 12/03/20 9:21 AM Medical Oncology and Hematology [facility] [place], [place] [address] Tel. [phone]    Fax. [phone]   I, Wilburn Mylar, am acting as scribe for Dr. Virgie Dad. Danyiel Crespin.  I, Lurline Del MD, have reviewed the above documentation for accuracy and completeness, and I agree with the above.   *Total Encounter Time as defined by the Centers for Medicare and Medicaid Services includes, in addition to the face-to-face time of a patient visit (documented in the note above) non-face-to-face time: obtaining and reviewing outside history, ordering and reviewing medications, tests or procedures, care coordination (communications with other health care professionals or caregivers) and documentation in the medical record.

## 2020-12-03 ENCOUNTER — Other Ambulatory Visit: Payer: Self-pay

## 2020-12-03 ENCOUNTER — Other Ambulatory Visit (HOSPITAL_COMMUNITY): Payer: Self-pay

## 2020-12-03 ENCOUNTER — Telehealth: Payer: Self-pay | Admitting: *Deleted

## 2020-12-03 ENCOUNTER — Inpatient Hospital Stay: Payer: 59

## 2020-12-03 ENCOUNTER — Other Ambulatory Visit: Payer: 59

## 2020-12-03 ENCOUNTER — Inpatient Hospital Stay (HOSPITAL_BASED_OUTPATIENT_CLINIC_OR_DEPARTMENT_OTHER): Payer: 59 | Admitting: Oncology

## 2020-12-03 VITALS — BP 139/67 | HR 83 | Temp 97.6°F | Resp 18 | Ht 66.0 in | Wt 295.3 lb

## 2020-12-03 DIAGNOSIS — Z170 Estrogen receptor positive status [ER+]: Secondary | ICD-10-CM | POA: Diagnosis not present

## 2020-12-03 DIAGNOSIS — C7951 Secondary malignant neoplasm of bone: Secondary | ICD-10-CM | POA: Diagnosis not present

## 2020-12-03 DIAGNOSIS — C7800 Secondary malignant neoplasm of unspecified lung: Secondary | ICD-10-CM

## 2020-12-03 DIAGNOSIS — Z9013 Acquired absence of bilateral breasts and nipples: Secondary | ICD-10-CM | POA: Diagnosis not present

## 2020-12-03 DIAGNOSIS — C50112 Malignant neoplasm of central portion of left female breast: Secondary | ICD-10-CM

## 2020-12-03 DIAGNOSIS — C50211 Malignant neoplasm of upper-inner quadrant of right female breast: Secondary | ICD-10-CM | POA: Diagnosis not present

## 2020-12-03 DIAGNOSIS — Z79810 Long term (current) use of selective estrogen receptor modulators (SERMs): Secondary | ICD-10-CM | POA: Diagnosis not present

## 2020-12-03 DIAGNOSIS — Z923 Personal history of irradiation: Secondary | ICD-10-CM | POA: Diagnosis not present

## 2020-12-03 DIAGNOSIS — C787 Secondary malignant neoplasm of liver and intrahepatic bile duct: Secondary | ICD-10-CM

## 2020-12-03 DIAGNOSIS — Z95828 Presence of other vascular implants and grafts: Secondary | ICD-10-CM

## 2020-12-03 DIAGNOSIS — Z5111 Encounter for antineoplastic chemotherapy: Secondary | ICD-10-CM | POA: Diagnosis not present

## 2020-12-03 LAB — CBC WITH DIFFERENTIAL/PLATELET
Abs Immature Granulocytes: 0.02 10*3/uL (ref 0.00–0.07)
Basophils Absolute: 0 10*3/uL (ref 0.0–0.1)
Basophils Relative: 1 %
Eosinophils Absolute: 0.1 10*3/uL (ref 0.0–0.5)
Eosinophils Relative: 3 %
HCT: 32.2 % — ABNORMAL LOW (ref 36.0–46.0)
Hemoglobin: 10.6 g/dL — ABNORMAL LOW (ref 12.0–15.0)
Immature Granulocytes: 0 %
Lymphocytes Relative: 12 %
Lymphs Abs: 0.6 10*3/uL — ABNORMAL LOW (ref 0.7–4.0)
MCH: 30.8 pg (ref 26.0–34.0)
MCHC: 32.9 g/dL (ref 30.0–36.0)
MCV: 93.6 fL (ref 80.0–100.0)
Monocytes Absolute: 0.6 10*3/uL (ref 0.1–1.0)
Monocytes Relative: 11 %
Neutro Abs: 3.7 10*3/uL (ref 1.7–7.7)
Neutrophils Relative %: 73 %
Platelets: 247 10*3/uL (ref 150–400)
RBC: 3.44 MIL/uL — ABNORMAL LOW (ref 3.87–5.11)
RDW: 17.8 % — ABNORMAL HIGH (ref 11.5–15.5)
WBC: 5 10*3/uL (ref 4.0–10.5)
nRBC: 0 % (ref 0.0–0.2)

## 2020-12-03 LAB — COMPREHENSIVE METABOLIC PANEL
ALT: 17 U/L (ref 0–44)
AST: 17 U/L (ref 15–41)
Albumin: 3.1 g/dL — ABNORMAL LOW (ref 3.5–5.0)
Alkaline Phosphatase: 132 U/L — ABNORMAL HIGH (ref 38–126)
Anion gap: 10 (ref 5–15)
BUN: 12 mg/dL (ref 6–20)
CO2: 29 mmol/L (ref 22–32)
Calcium: 9.1 mg/dL (ref 8.9–10.3)
Chloride: 103 mmol/L (ref 98–111)
Creatinine, Ser: 0.85 mg/dL (ref 0.44–1.00)
GFR, Estimated: >60 mL/min (ref >60)
Glucose, Bld: 98 mg/dL (ref 70–99)
Potassium: 3.3 mmol/L — ABNORMAL LOW (ref 3.5–5.1)
Sodium: 142 mmol/L (ref 135–145)
Total Bilirubin: 0.4 mg/dL (ref 0.3–1.2)
Total Protein: 6.8 g/dL (ref 6.5–8.1)

## 2020-12-03 MED ORDER — SODIUM CHLORIDE 0.9 % IV SOLN
80.0000 mg/m2 | Freq: Once | INTRAVENOUS | Status: AC
  Administered 2020-12-03: 192 mg via INTRAVENOUS
  Filled 2020-12-03: qty 32

## 2020-12-03 MED ORDER — DIPHENHYDRAMINE HCL 50 MG/ML IJ SOLN
INTRAMUSCULAR | Status: AC
  Filled 2020-12-03: qty 1

## 2020-12-03 MED ORDER — COLD PACK MISC ONCOLOGY
1.0000 | Freq: Once | Status: DC | PRN
  Filled 2020-12-03: qty 1

## 2020-12-03 MED ORDER — SODIUM CHLORIDE 0.9 % IV SOLN
10.0000 mg | Freq: Once | INTRAVENOUS | Status: AC
  Administered 2020-12-03: 10 mg via INTRAVENOUS
  Filled 2020-12-03: qty 10

## 2020-12-03 MED ORDER — FAMOTIDINE 20 MG IN NS 100 ML IVPB
INTRAVENOUS | Status: AC
  Filled 2020-12-03: qty 100

## 2020-12-03 MED ORDER — DIPHENHYDRAMINE HCL 50 MG/ML IJ SOLN
25.0000 mg | Freq: Once | INTRAMUSCULAR | Status: AC
  Administered 2020-12-03: 25 mg via INTRAVENOUS

## 2020-12-03 MED ORDER — FAMOTIDINE 20 MG IN NS 100 ML IVPB
20.0000 mg | Freq: Once | INTRAVENOUS | Status: AC
  Administered 2020-12-03: 20 mg via INTRAVENOUS

## 2020-12-03 MED ORDER — SODIUM CHLORIDE 0.9% FLUSH
10.0000 mL | INTRAVENOUS | Status: DC | PRN
  Administered 2020-12-03: 10 mL
  Filled 2020-12-03: qty 10

## 2020-12-03 MED ORDER — SODIUM CHLORIDE 0.9 % IV SOLN
Freq: Once | INTRAVENOUS | Status: AC
  Filled 2020-12-03: qty 250

## 2020-12-03 MED ORDER — SODIUM CHLORIDE 0.9% FLUSH
10.0000 mL | INTRAVENOUS | Status: DC | PRN
  Administered 2020-12-03: 10 mL via INTRAVENOUS
  Filled 2020-12-03: qty 10

## 2020-12-03 MED ORDER — HEPARIN SOD (PORK) LOCK FLUSH 100 UNIT/ML IV SOLN
500.0000 [IU] | Freq: Once | INTRAVENOUS | Status: AC | PRN
  Administered 2020-12-03: 500 [IU]
  Filled 2020-12-03: qty 5

## 2020-12-03 NOTE — Telephone Encounter (Signed)
Pt notified of message below. Verbalized understanding 

## 2020-12-03 NOTE — Telephone Encounter (Signed)
-----   Message from Gardenia Phlegm, NP sent at 12/03/2020 11:54 AM EDT ----- Please review increased potassium intake with patient, her potassium is slightly decreased ----- Message ----- From: Interface, Lab In [place] Sent: 12/03/2020   8:25 AM EDT To: Chauncey Cruel, MD

## 2020-12-03 NOTE — Progress Notes (Signed)
Md is aware of K 3.3.  He spoke to the patient about ways to increase potassium in her diet as she is taking Potassium tabs; per patient.

## 2020-12-03 NOTE — Patient Instructions (Signed)
[facility]  Discharge Instructions: Thank you for choosing [facility] to provide your oncology and hematology care.   If you have a lab appointment with the [facility], please go directly to the [facility] and check in at the registration area.   Wear comfortable clothing and clothing appropriate for easy access to any Portacath or PICC line.   We strive to give you quality time with your provider. You may need to reschedule your appointment if you arrive late (15 or more minutes).  Arriving late affects you and other patients whose appointments are after yours.  Also, if you miss three or more appointments without notifying the office, you may be dismissed from the clinic at the provider's discretion.      For prescription refill requests, have your pharmacy contact our office and allow 72 hours for refills to be completed.    Today you received the following chemotherapy and/or immunotherapy agents Taxol      To help prevent nausea and vomiting after your treatment, we encourage you to take your nausea medication as directed.  BELOW ARE SYMPTOMS THAT SHOULD BE REPORTED IMMEDIATELY: *FEVER GREATER THAN 100.4 F (38 C) OR HIGHER *CHILLS OR SWEATING *NAUSEA AND VOMITING THAT IS NOT CONTROLLED WITH YOUR NAUSEA MEDICATION *UNUSUAL SHORTNESS OF BREATH *UNUSUAL BRUISING OR BLEEDING *URINARY PROBLEMS (pain or burning when urinating, or frequent urination) *BOWEL PROBLEMS (unusual diarrhea, constipation, pain near the anus) TENDERNESS IN MOUTH AND THROAT WITH OR WITHOUT PRESENCE OF ULCERS (sore throat, sores in mouth, or a toothache) UNUSUAL RASH, SWELLING OR PAIN  UNUSUAL VAGINAL DISCHARGE OR ITCHING   Items with * indicate a potential emergency and should be followed up as soon as possible or go to the Emergency Department if any problems should occur.  Please show the CHEMOTHERAPY ALERT CARD or IMMUNOTHERAPY ALERT CARD at check-in to the  Emergency Department and triage nurse.  Should you have questions after your visit or need to cancel or reschedule your appointment, please contact [facility]  Dept: [phone]  and follow the prompts.  Office hours are 8:00 a.m. to 4:30 p.m. Monday - Friday. Please note that voicemails left after 4:00 p.m. may not be returned until the following business day.  We are closed weekends and major holidays. You have access to a nurse at all times for urgent questions. Please call the main number to the clinic Dept: [phone] and follow the prompts.   For any non-urgent questions, you may also contact your provider using MyChart. We now offer e-Visits for anyone 47 and older to request care online for non-urgent symptoms. For details visit [email or web address].   Also download the MyChart app! Go to the app store, search "MyChart", open the app, select [facility], and log in with your MyChart username and password.  Due to Covid, a mask is required upon entering the hospital/clinic. If you do not have a mask, one will be given to you upon arrival. For doctor visits, patients may have 1 support person aged 91 or older with them. For treatment visits, patients cannot have anyone with them due to current Covid guidelines and our immunocompromised population.

## 2020-12-04 ENCOUNTER — Telehealth: Payer: Self-pay | Admitting: Oncology

## 2020-12-04 LAB — CANCER ANTIGEN 27.29: CA 27.29: 97 U/mL — ABNORMAL HIGH (ref 0.0–38.6)

## 2020-12-04 NOTE — Telephone Encounter (Signed)
Scheduled appointment per 07/26 los. Patient is aware.

## 2020-12-10 ENCOUNTER — Other Ambulatory Visit: Payer: 59

## 2020-12-10 ENCOUNTER — Inpatient Hospital Stay: Payer: 59

## 2020-12-10 ENCOUNTER — Ambulatory Visit: Payer: 59

## 2020-12-17 ENCOUNTER — Inpatient Hospital Stay: Payer: 59

## 2020-12-17 ENCOUNTER — Other Ambulatory Visit (HOSPITAL_COMMUNITY): Payer: Self-pay

## 2020-12-17 ENCOUNTER — Inpatient Hospital Stay: Payer: 59 | Attending: Oncology

## 2020-12-17 ENCOUNTER — Other Ambulatory Visit: Payer: Self-pay

## 2020-12-17 VITALS — BP 164/77 | HR 82 | Temp 98.3°F | Resp 18 | Wt 293.2 lb

## 2020-12-17 DIAGNOSIS — C50211 Malignant neoplasm of upper-inner quadrant of right female breast: Secondary | ICD-10-CM

## 2020-12-17 DIAGNOSIS — Z95828 Presence of other vascular implants and grafts: Secondary | ICD-10-CM

## 2020-12-17 DIAGNOSIS — Z923 Personal history of irradiation: Secondary | ICD-10-CM | POA: Insufficient documentation

## 2020-12-17 DIAGNOSIS — Z79810 Long term (current) use of selective estrogen receptor modulators (SERMs): Secondary | ICD-10-CM | POA: Diagnosis not present

## 2020-12-17 DIAGNOSIS — Z170 Estrogen receptor positive status [ER+]: Secondary | ICD-10-CM | POA: Diagnosis not present

## 2020-12-17 DIAGNOSIS — C7951 Secondary malignant neoplasm of bone: Secondary | ICD-10-CM | POA: Diagnosis not present

## 2020-12-17 DIAGNOSIS — Z9013 Acquired absence of bilateral breasts and nipples: Secondary | ICD-10-CM | POA: Diagnosis not present

## 2020-12-17 DIAGNOSIS — C787 Secondary malignant neoplasm of liver and intrahepatic bile duct: Secondary | ICD-10-CM | POA: Insufficient documentation

## 2020-12-17 DIAGNOSIS — Z5111 Encounter for antineoplastic chemotherapy: Secondary | ICD-10-CM | POA: Diagnosis not present

## 2020-12-17 DIAGNOSIS — C50112 Malignant neoplasm of central portion of left female breast: Secondary | ICD-10-CM

## 2020-12-17 LAB — CBC WITH DIFFERENTIAL/PLATELET
Abs Immature Granulocytes: 0.02 10*3/uL (ref 0.00–0.07)
Basophils Absolute: 0 10*3/uL (ref 0.0–0.1)
Basophils Relative: 1 %
Eosinophils Absolute: 0.1 10*3/uL (ref 0.0–0.5)
Eosinophils Relative: 2 %
HCT: 32.8 % — ABNORMAL LOW (ref 36.0–46.0)
Hemoglobin: 10.8 g/dL — ABNORMAL LOW (ref 12.0–15.0)
Immature Granulocytes: 1 %
Lymphocytes Relative: 15 %
Lymphs Abs: 0.7 10*3/uL (ref 0.7–4.0)
MCH: 30.8 pg (ref 26.0–34.0)
MCHC: 32.9 g/dL (ref 30.0–36.0)
MCV: 93.4 fL (ref 80.0–100.0)
Monocytes Absolute: 0.4 10*3/uL (ref 0.1–1.0)
Monocytes Relative: 9 %
Neutro Abs: 3.2 10*3/uL (ref 1.7–7.7)
Neutrophils Relative %: 72 %
Platelets: 254 10*3/uL (ref 150–400)
RBC: 3.51 MIL/uL — ABNORMAL LOW (ref 3.87–5.11)
RDW: 18 % — ABNORMAL HIGH (ref 11.5–15.5)
WBC: 4.4 10*3/uL (ref 4.0–10.5)
nRBC: 0 % (ref 0.0–0.2)

## 2020-12-17 LAB — COMPREHENSIVE METABOLIC PANEL
ALT: 14 U/L (ref 0–44)
AST: 15 U/L (ref 15–41)
Albumin: 3.3 g/dL — ABNORMAL LOW (ref 3.5–5.0)
Alkaline Phosphatase: 121 U/L (ref 38–126)
Anion gap: 8 (ref 5–15)
BUN: 10 mg/dL (ref 6–20)
CO2: 29 mmol/L (ref 22–32)
Calcium: 9.4 mg/dL (ref 8.9–10.3)
Chloride: 104 mmol/L (ref 98–111)
Creatinine, Ser: 0.82 mg/dL (ref 0.44–1.00)
GFR, Estimated: >60 mL/min (ref >60)
Glucose, Bld: 93 mg/dL (ref 70–99)
Potassium: 3.8 mmol/L (ref 3.5–5.1)
Sodium: 141 mmol/L (ref 135–145)
Total Bilirubin: 0.4 mg/dL (ref 0.3–1.2)
Total Protein: 6.9 g/dL (ref 6.5–8.1)

## 2020-12-17 MED ORDER — SODIUM CHLORIDE 0.9 % IV SOLN
10.0000 mg | Freq: Once | INTRAVENOUS | Status: AC
  Administered 2020-12-17: 10 mg via INTRAVENOUS
  Filled 2020-12-17: qty 10

## 2020-12-17 MED ORDER — SODIUM CHLORIDE 0.9% FLUSH
10.0000 mL | INTRAVENOUS | Status: DC | PRN
  Administered 2020-12-17: 10 mL
  Filled 2020-12-17: qty 10

## 2020-12-17 MED ORDER — FAMOTIDINE 20 MG IN NS 100 ML IVPB
20.0000 mg | Freq: Once | INTRAVENOUS | Status: AC
  Administered 2020-12-17: 20 mg via INTRAVENOUS

## 2020-12-17 MED ORDER — DIPHENHYDRAMINE HCL 50 MG/ML IJ SOLN
INTRAMUSCULAR | Status: AC
  Filled 2020-12-17: qty 1

## 2020-12-17 MED ORDER — FAMOTIDINE 20 MG IN NS 100 ML IVPB
INTRAVENOUS | Status: AC
  Filled 2020-12-17: qty 100

## 2020-12-17 MED ORDER — DIPHENHYDRAMINE HCL 50 MG/ML IJ SOLN
25.0000 mg | Freq: Once | INTRAMUSCULAR | Status: AC
  Administered 2020-12-17: 25 mg via INTRAVENOUS

## 2020-12-17 MED ORDER — DENOSUMAB 120 MG/1.7ML ~~LOC~~ SOLN
SUBCUTANEOUS | Status: AC
  Filled 2020-12-17: qty 1.7

## 2020-12-17 MED ORDER — SODIUM CHLORIDE 0.9 % IV SOLN
Freq: Once | INTRAVENOUS | Status: AC
  Filled 2020-12-17: qty 250

## 2020-12-17 MED ORDER — SODIUM CHLORIDE 0.9 % IV SOLN
80.0000 mg/m2 | Freq: Once | INTRAVENOUS | Status: AC
  Administered 2020-12-17: 192 mg via INTRAVENOUS
  Filled 2020-12-17: qty 32

## 2020-12-17 MED ORDER — SODIUM CHLORIDE 0.9% FLUSH
10.0000 mL | INTRAVENOUS | Status: AC | PRN
  Administered 2020-12-17: 10 mL
  Filled 2020-12-17: qty 10

## 2020-12-17 MED ORDER — HEPARIN SOD (PORK) LOCK FLUSH 100 UNIT/ML IV SOLN
500.0000 [IU] | Freq: Once | INTRAVENOUS | Status: AC | PRN
  Administered 2020-12-17: 500 [IU]
  Filled 2020-12-17: qty 5

## 2020-12-17 MED ORDER — DENOSUMAB 120 MG/1.7ML ~~LOC~~ SOLN
120.0000 mg | Freq: Once | SUBCUTANEOUS | Status: AC
  Administered 2020-12-17: 120 mg via SUBCUTANEOUS

## 2020-12-17 MED FILL — Losartan Potassium & Hydrochlorothiazide Tab 100-25 MG: ORAL | 60 days supply | Qty: 60 | Fill #1 | Status: AC

## 2020-12-17 NOTE — Patient Instructions (Signed)
[facility]  Discharge Instructions: Thank you for choosing [facility] to provide your oncology and hematology care.   If you have a lab appointment with the [facility], please go directly to the [facility] and check in at the registration area.   Wear comfortable clothing and clothing appropriate for easy access to any Portacath or PICC line.   We strive to give you quality time with your provider. You may need to reschedule your appointment if you arrive late (15 or more minutes).  Arriving late affects you and other patients whose appointments are after yours.  Also, if you miss three or more appointments without notifying the office, you may be dismissed from the clinic at the provider's discretion.      For prescription refill requests, have your pharmacy contact our office and allow 72 hours for refills to be completed.    Today you received the following chemotherapy and/or immunotherapy agents: Taxol     To help prevent nausea and vomiting after your treatment, we encourage you to take your nausea medication as directed.  BELOW ARE SYMPTOMS THAT SHOULD BE REPORTED IMMEDIATELY: *FEVER GREATER THAN 100.4 F (38 C) OR HIGHER *CHILLS OR SWEATING *NAUSEA AND VOMITING THAT IS NOT CONTROLLED WITH YOUR NAUSEA MEDICATION *UNUSUAL SHORTNESS OF BREATH *UNUSUAL BRUISING OR BLEEDING *URINARY PROBLEMS (pain or burning when urinating, or frequent urination) *BOWEL PROBLEMS (unusual diarrhea, constipation, pain near the anus) TENDERNESS IN MOUTH AND THROAT WITH OR WITHOUT PRESENCE OF ULCERS (sore throat, sores in mouth, or a toothache) UNUSUAL RASH, SWELLING OR PAIN  UNUSUAL VAGINAL DISCHARGE OR ITCHING   Items with * indicate a potential emergency and should be followed up as soon as possible or go to the Emergency Department if any problems should occur.  Please show the CHEMOTHERAPY ALERT CARD or IMMUNOTHERAPY ALERT CARD at check-in to the  Emergency Department and triage nurse.  Should you have questions after your visit or need to cancel or reschedule your appointment, please contact [facility]  Dept: [phone]  and follow the prompts.  Office hours are 8:00 a.m. to 4:30 p.m. Monday - Friday. Please note that voicemails left after 4:00 p.m. may not be returned until the following business day.  We are closed weekends and major holidays. You have access to a nurse at all times for urgent questions. Please call the main number to the clinic Dept: [phone] and follow the prompts.   For any non-urgent questions, you may also contact your provider using MyChart. We now offer e-Visits for anyone 69 and older to request care online for non-urgent symptoms. For details visit [email or web address].   Also download the MyChart app! Go to the app store, search "MyChart", open the app, select [facility], and log in with your MyChart username and password.  Due to Covid, a mask is required upon entering the hospital/clinic. If you do not have a mask, one will be given to you upon arrival. For doctor visits, patients may have 1 support person aged 51 or older with them. For treatment visits, patients cannot have anyone with them due to current Covid guidelines and our immunocompromised population.

## 2020-12-18 ENCOUNTER — Other Ambulatory Visit (HOSPITAL_COMMUNITY): Payer: Self-pay

## 2020-12-18 LAB — CANCER ANTIGEN 27.29: CA 27.29: 110.3 U/mL — ABNORMAL HIGH (ref 0.0–38.6)

## 2020-12-20 ENCOUNTER — Other Ambulatory Visit (HOSPITAL_COMMUNITY): Payer: Self-pay

## 2020-12-23 ENCOUNTER — Other Ambulatory Visit (HOSPITAL_COMMUNITY): Payer: Self-pay

## 2020-12-23 ENCOUNTER — Telehealth: Payer: Self-pay | Admitting: Oncology

## 2020-12-23 NOTE — Telephone Encounter (Signed)
R/s appts times per [facility]. Will have updated calendar printed for pt at next visit.

## 2020-12-24 ENCOUNTER — Other Ambulatory Visit (HOSPITAL_COMMUNITY): Payer: Self-pay

## 2020-12-24 MED ORDER — ROSUVASTATIN CALCIUM 5 MG PO TABS
5.0000 mg | ORAL_TABLET | Freq: Every day | ORAL | 0 refills | Status: DC
  Filled 2020-12-24: qty 30, 30d supply, fill #0

## 2020-12-25 ENCOUNTER — Other Ambulatory Visit (HOSPITAL_COMMUNITY): Payer: Self-pay

## 2020-12-25 MED ORDER — ROSUVASTATIN CALCIUM 5 MG PO TABS
5.0000 mg | ORAL_TABLET | Freq: Every day | ORAL | 1 refills | Status: DC
  Filled 2020-12-25: qty 30, 30d supply, fill #0
  Filled 2021-01-21: qty 30, 30d supply, fill #1

## 2020-12-30 MED FILL — Dexamethasone Sodium Phosphate Inj 100 MG/10ML: INTRAMUSCULAR | Qty: 1 | Status: AC

## 2020-12-30 NOTE — Progress Notes (Signed)
[place]  [phone] [phone]    ID: Debbie [name] DOB: 1965/09/29  MR#: [id number]  JYN#:[id number]  Patient Care Team: Kelton Pillar, MD as PCP - General (Family Medicine) Jacelyn Pi, MD as Consulting Physician (Endocrinology) Jovita Kussmaul, MD as Consulting Physician (General Surgery) Emilyn Ruble, Virgie Dad, MD as Consulting Physician (Oncology) [name], MD as Attending Physician (Radiation Oncology) Belva Crome, MD as Consulting Physician (Cardiology) Crissie Reese, MD as Consulting Physician (Plastic Surgery) [name], [name], NP as Nurse Practitioner (Hematology and Oncology) Lajoyce Lauber, DMD (Dentistry) Raina Mina, RPH-CPP (Pharmacist) OTHER MD:   CHIEF COMPLAINT: Estrogen receptor positive stage IV  breast cancer (s/p bilateral mastectomies)  CURRENT TREATMENT: paclitaxel; denosumab/Xgeva   INTERVAL HISTORY: Debbie [name] returns today for follow up and treatment of her stage IV breast cancer.  She was started on paclitaxel on 09/10/2020, initially 3 weeks on 1 week off.. She now receives treatments days 1 and 15 of each 28-day cycle. Today is day 1, cycle 5.  She continues on Xgeva every 4 weeks, with her most recent dose on 11/19/2020.  She has no side effects from this that she is aware of  We are following her CA 27-29: Lab Results  Component Value Date   CA2729 110.3 (H) 12/17/2020   CA2729 97.0 (H) 12/03/2020   CA2729 100.5 (H) 11/19/2020   CA2729 103.0 (H) 11/05/2020   CA2729 118.1 (H) 10/22/2020   Her most recent restaging study was 11/26/2020 chest CT, which showed clear evidence of improvement.  Her last MRI of the abdomen was 08/23/2020.  REVIEW OF SYSTEMS: [place] has no peripheral neuropathy symptoms.  She does feel sluggish and stiff.  She tried to go to the mall and could barely get there.  She has pain in her lower back which occasionally goes to the right and occasionally goes to  the left.  If she takes an Advil it helps a little bit.  She continues to have lymphedema in the right arm but that stable.  She has some skin irritation in the right upper quadrant of the abdomen which is relieved by massage.  She is minimally constipated.  She takes stool softeners but not MiraLAX.  She has no urine problems.  She is mildly nauseated but does not vomit does not have headaches and does not have visual changes.  She continues to work full-time.  She continues to gain weight.  A detailed review of systems today was otherwise stable   COVID 19 VACCINATION STATUS: Status post Pfizer x2, with booster January 2021   BREAST CANCER HISTORY: From the original intake note:  Debbie [name] had screening mammography with tomosynthesis at the [facility] 10/02/2015. This showed 2 possible masses in the right breast as well as some suspicious calcifications. The left breast was unremarkable. On 10/15/2015 she underwent right diagnostic mammography with tomography and right breast ultrasonography. The breast density was category B. Spot magnification views confirmed an area of pleomorphic calcifications measuring up to 5 cm in the upper half of the breast. Also in the upper outer right breast there were adjacent low-density masses without distortion or calcifications. On physical exam there was no palpable abnormality.   Targeted ultrasound of the right breast found the 2 "masses" in the right upper quadrant were benign cysts measuring 0.8 and 0.5 cm respectively. Biopsy of the area of calcification on 10/15/2015 showed ([id number]) invasive ductal carcinoma, E-cadherin positive, grade 2, estrogen receptor 90% positive, progesterone receptor 50% positive,  both with strong staining intensity, with an MIB-1 of 70%, and no HER-2 amplification, the signals ratio being 1.18 and the number per cell 2.60.   Her subsequent history is as detailed below    PAST MEDICAL HISTORY: Past Medical History:   Diagnosis Date   Anxiety    Arthritis    Bone metastasis ([place])    Breast cancer of upper-inner quadrant of right female breast [facility]) oncologist-  dr [name]--- 04/ 2019 ER/PR+ Stage IV w/ metastatic disease to liver, total spine, lung, bone   dx 06/ 2017  right breast invasive ductal carcinoma, Stage IIB, ER/PR + (pT2 pN1), grade 1-- s/p  right breast lumpecotmy w/ sln bx's and bilateral breast reduction 11-28-2015/  left breast with reduction , dx invasive ductal ca, Grade 1 (pT1cpNX) ER/PR positive/  adjuvant radiation completed 03-27-2016 to right chest wall, 2018  reclassifiec Stage IIA   Cancer, metastatic to liver ([place])    Chronic back pain    Constipation    Depression    Family history of adverse reaction to anesthesia     mother has Copd was kept on vent    Headache    Heart murmur    History of hyperthyroidism    s/p  RAI   History of radiation therapy 02/11/16- 03/27/16   Right Chest Wall 50.4 Gy in 28 fractions, Right Chest Wall Boost 10 Gy in 5 fractions.    Hyperlipidemia    Hypertension    Hypothyroidism, postradioiodine therapy    endocrinoloigst-  dr balan--  2004  s/p  RAI   Menorrhagia 2011   Multiple pulmonary nodules    secondary  metastatic   PONV (postoperative nausea and vomiting)    patch helps    SUI (stress urinary incontinence, female)    Wears contact lenses    Wears dentures    full upper and lower partial    PAST SURGICAL HISTORY: Past Surgical History:  Procedure Laterality Date   BREAST LUMPECTOMY WITH NEEDLE LOCALIZATION AND AXILLARY SENTINEL LYMPH NODE BX Right 11/28/2015   Procedure: RIGHT BREAST LUMPECTOMY WITH NEEDLE LOCALIZATION AND AXILLARY SENTINEL LYMPH NODE BX;  Surgeon: Autumn Messing III, MD;  Location: [place];  Service: General;  Laterality: Right;   BREAST REDUCTION SURGERY Bilateral 11/28/2015   Procedure: MAMMARY REDUCTION  (BREAST) BILATERAL;  Surgeon: Crissie Reese, MD;  Location: [place];  Service: Plastics;  Laterality: Bilateral;    CESAREAN SECTION  01/12/1993   COLONOSCOPY     Dental surgeries     FEMUR IM NAIL Left 01/28/2018   Procedure: INTRAMEDULLARY (IM) NAIL FEMORAL;  Surgeon: Nicholes Stairs, MD;  Location: [place];  Service: Orthopedics;  Laterality: Left;   HYSTEROSCOPY N/A 09/02/2017   Procedure: HYSTEROSCOPY  to remove IUD;  Surgeon: Eldred Manges, MD;  Location: [place];  Service: Gynecology;  Laterality: N/A;   IR IMAGING GUIDED PORT INSERTION  09/16/2020   Liver biospy     MASTECTOMY     MASTECTOMY W/ SENTINEL NODE BIOPSY Bilateral 12/26/2015   Procedure: BILATERAL MASTECTOMY WITH LEFT SENTINEL LYMPH NODE BIOPSY;  Surgeon: Autumn Messing III, MD;  Location: [place];  Service: General;  Laterality: Bilateral;   Removal of IUD     TUBAL LIGATION Bilateral 1995    FAMILY HISTORY: Family History  Problem Relation Age of Onset   Diabetes Mother    Hypertension Mother    Hypertension Father    Diabetes Maternal Grandmother    Hypertension Sister  Cancer Paternal Grandmother        colon   Colon cancer Paternal Grandmother    Esophageal cancer Neg Hx    Stomach cancer Neg Hx    Rectal cancer Neg Hx   The patient's parents are still living, in their early 34s as of June 2017. The patient has one brother, 2 sisters. On the maternal side there is a history of uterine and prostate cancer. On the paternal side there is a history of colon cancer and possibly uterine cancer. There is no history of breast or ovarian cancer in the family.    GYNECOLOGIC HISTORY:  Patient's last menstrual period was 12/19/2015.  Menarche age 35. She still having regular periods. She is GX P1, first pregnancy age 89. She used oral contraceptives for a few years remotely, with no complications. She is currently on a Mirena IUD and also is status post bilateral tubal ligation.   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 Her husband [name] is a Administrator.  They  are both taking appropriate precautions at work.  Their son [name], age 44, is a radio DJ.  He no longer lives with the patient.  The patient has no grandchildren. She is not a Ambulance person.   ADVANCED DIRECTIVES: In the absence of any documents to the contrary her husband is her healthcare power of attorney   HEALTH MAINTENANCE: Social History   Tobacco Use   Smoking status: Never   Smokeless tobacco: Never  Vaping Use   Vaping Use: Never used  Substance Use Topics   Alcohol use: No   Drug use: No     Colonoscopy: January 2016   PAP: January 2016   Bone density: August 2017 showed a T score of +0.7 normal  Lipid panel:  Allergies  Allergen Reactions   Ace Inhibitors Cough   Atorvastatin Other (See Comments)    Joint pain   Simvastatin Other (See Comments)    Joint pain    Current Outpatient Medications  Medication Sig Dispense Refill   acetaminophen (TYLENOL) 500 MG tablet Take 1,000 mg by mouth every 6 (six) hours as needed.     blood glucose meter kit and supplies KIT Dispense based on patient and insurance preference. Use up to four times daily as directed. (FOR ICD-9 250.00, 250.01). 1 each 0   Calcium-Magnesium 250-125 MG TABS Take 1 tablet by mouth daily. 120 each 4   cetirizine (ZYRTEC) 10 MG tablet Take 10 mg by mouth daily as needed for allergies.      chlorhexidine (PERIDEX) 0.12 % solution Use 5 mLs in the mouth or throat 2 (two) times daily as needed as directed. Do not swallow 473 mL 6   doxycycline (VIBRA-TABS) 100 MG tablet Take 1 tablet (100 mg total) by mouth daily. 30 tablet 2   Ferrous Sulfate (IRON) 325 (65 Fe) MG TABS Take 1 tablet (325 mg total) by mouth daily. 30 each 2   furosemide (LASIX) 20 MG tablet Take 1 tablet (20 mg total) by mouth daily. 90 tablet 6   glucose blood test strip USE AS DIRECTED TO TEST UP TO FOUR TIMES DAILY (Patient taking differently: USE AS DIRECTED TO TEST UP TO FOUR TIMES DAILY) 100 strip 0   ibuprofen (ADVIL) 800  MG tablet Take 1 tablet (800 mg total) by mouth 2 (two) times daily as needed. 30 tablet 1   Lancets (ONETOUCH ULTRASOFT) lancets Use as instructed 100 each 12  levothyroxine (SYNTHROID) 150 MCG tablet Take 1 tablet (150 mcg total) by mouth every morning on an empty stomach 90 tablet 4   lidocaine-prilocaine (EMLA) cream Apply to affected area once 30 g 3   LORazepam (ATIVAN) 1 MG tablet TAKE 1/2 TO 1 TABLET BY MOUTH AT BEDTIME AS NEEDED 30 tablet 0   losartan-hydrochlorothiazide (HYZAAR) 100-25 MG tablet TAKE 1 TABLET BY MOUTH ONCE DAILY 90 tablet 1   Multiple Vitamin (MULTIVITAMIN) tablet Take 1 tablet by mouth daily.     omega-3 acid ethyl esters (LOVAZA) 1 g capsule Take 1 g by mouth 2 (two) times daily.      omeprazole (PRILOSEC) 40 MG capsule Take 1 capsule (40 mg total) by mouth daily. 30 capsule 3   oxyCODONE (OXY IR/ROXICODONE) 5 MG immediate release tablet Take 1 tablet (5 mg total) by mouth every 4 (four) hours as needed for severe pain. 60 tablet 0   potassium chloride SA (KLOR-CON) 20 MEQ tablet Take 2 tablets (40 mEq total) by mouth daily. 90 tablet 4   pregabalin (LYRICA) 100 MG capsule Take 1 capsule (100 mg total) by mouth 3 (three) times daily. 90 capsule 0   prochlorperazine (COMPAZINE) 10 MG tablet Take 1 tablet (10 mg total) by mouth every 6 (six) hours as needed (Nausea or vomiting). 30 tablet 1   rosuvastatin (CRESTOR) 5 MG tablet TAKE 1 TABLET BY MOUTH ONCE DAILY 30 tablet 7   rosuvastatin (CRESTOR) 5 MG tablet Take 1 tablet (5 mg total) by mouth daily. 30 tablet 0   rosuvastatin (CRESTOR) 5 MG tablet Take 1 tablet (5 mg total) by mouth daily. 30 tablet 1   sertraline (ZOLOFT) 100 MG tablet TAKE 1 TABLET BY MOUTH ONCE A DAY 30 tablet 12   valACYclovir (VALTREX) 1000 MG tablet Take 1 tablet (1,000 mg total) by mouth daily. 60 tablet 6   Vitamin D, Cholecalciferol, 1000 units CAPS Take 1 tablet by mouth daily. (Patient taking differently: Take 1,000 Units by mouth daily.) 90  capsule 4   No current facility-administered medications for this visit.    OBJECTIVE: African-American woman who appears stated age  55:   12/31/20 0826  BP: (!) 147/74  Pulse: 89  Resp: 18  Temp: 97.7 F (36.5 C)  SpO2: 98%     Body mass index is 48.26 kg/m.    ECOG FS:1 - Symptomatic but completely ambulatory  Filed Weights   12/31/20 0826  Weight: 299 lb (135.6 kg)    Sclerae unicteric, EOMs intact Wearing a mask No cervical or supraclavicular adenopathy Lungs no rales or rhonchi Heart regular rate and rhythm Abd soft, nontender, positive bowel sounds MSK no focal spinal tenderness to vigorous palpation of the lower spine, chronic right grade 2 upper extremity lymphedema Neuro: nonfocal, well oriented, appropriate affect Breasts: Deferred   LAB RESULTS:  CMP     Component Value Date/Time   NA 141 12/17/2020 1024   NA 140 07/08/2016 0948   K 3.8 12/17/2020 1024   K 4.1 07/08/2016 0948   CL 104 12/17/2020 1024   CO2 29 12/17/2020 1024   CO2 24 07/08/2016 0948   GLUCOSE 93 12/17/2020 1024   GLUCOSE 89 07/08/2016 0948   BUN 10 12/17/2020 1024   BUN 11.9 07/08/2016 0948   CREATININE 0.82 12/17/2020 1024   CREATININE 0.84 08/22/2020 1050   CREATININE 0.9 07/08/2016 0948   CALCIUM 9.4 12/17/2020 1024   CALCIUM 10.0 07/08/2016 0948   PROT 6.9 12/17/2020 1024   PROT  7.6 07/08/2016 0948   ALBUMIN 3.3 (L) 12/17/2020 1024   ALBUMIN 3.6 07/08/2016 0948   AST 15 12/17/2020 1024   AST 16 08/22/2020 1050   AST 22 07/08/2016 0948   ALT 14 12/17/2020 1024   ALT 14 08/22/2020 1050   ALT 27 07/08/2016 0948   ALKPHOS 121 12/17/2020 1024   ALKPHOS 115 07/08/2016 0948   BILITOT 0.4 12/17/2020 1024   BILITOT 0.3 08/22/2020 1050   BILITOT 0.35 07/08/2016 0948   GFRNONAA >60 12/17/2020 1024   GFRNONAA >60 08/22/2020 1050   GFRAA >60 01/30/2020 0814   GFRAA >60 05/16/2019 0905    INo results found for: SPEP, UPEP  Lab Results  Component Value Date   WBC 4.7  12/31/2020   NEUTROABS 3.5 12/31/2020   HGB 10.2 (L) 12/31/2020   HCT 31.2 (L) 12/31/2020   MCV 92.9 12/31/2020   PLT 244 12/31/2020      Chemistry      Component Value Date/Time   NA 141 12/17/2020 1024   NA 140 07/08/2016 0948   K 3.8 12/17/2020 1024   K 4.1 07/08/2016 0948   CL 104 12/17/2020 1024   CO2 29 12/17/2020 1024   CO2 24 07/08/2016 0948   BUN 10 12/17/2020 1024   BUN 11.9 07/08/2016 0948   CREATININE 0.82 12/17/2020 1024   CREATININE 0.84 08/22/2020 1050   CREATININE 0.9 07/08/2016 0948      Component Value Date/Time   CALCIUM 9.4 12/17/2020 1024   CALCIUM 10.0 07/08/2016 0948   ALKPHOS 121 12/17/2020 1024   ALKPHOS 115 07/08/2016 0948   AST 15 12/17/2020 1024   AST 16 08/22/2020 1050   AST 22 07/08/2016 0948   ALT 14 12/17/2020 1024   ALT 14 08/22/2020 1050   ALT 27 07/08/2016 0948   BILITOT 0.4 12/17/2020 1024   BILITOT 0.3 08/22/2020 1050   BILITOT 0.35 07/08/2016 0948       No results found for: LABCA2  No components found for: LABCA125  No results for input(s): INR in the last 168 hours.  Urinalysis No results found for: COLORURINE, APPEARANCEUR, LABSPEC, PHURINE, GLUCOSEU, HGBUR, BILIRUBINUR, KETONESUR, PROTEINUR, UROBILINOGEN, NITRITE, LEUKOCYTESUR   STUDIES: No results found.   ELIGIBLE FOR AVAILABLE RESEARCH PROTOCOL: PALLAS, but inelegible because of Mirena IUD   ASSESSMENT: 55 y.o. [place] woman status post right breast upper inner quadrant biopsy 10/15/2015 for a clinical T2-T3 N0, stage II invasive ductal carcinoma, estrogen and progesterone receptor positive, HER-2 nonamplified, with an Mib-1 of 70%  (1) status post right lumpectomy and sentinel lymph node sampling with oncoplastic breast reduction 11/28/2015 for a pT2 pN1, stage IIB invasive ductal carcinoma, grade 1, with negative margins (1/1 sentinel node positive)  (a) reclassified as stage IIA in the 2018 prognostic revision   (2) left reduction mammoplasty 11/28/2015  unexpectedly found a pT1c pNX invasive ductal carcinoma, grade 1, estrogen and progesterone receptor positive, HER-2 not amplified, with an MIB-1 of 3%  (3) Mammaprint from the right sided breast cancer was "low risk", predicting a 98% chance of no disease recurrence within 5 years with anti-estrogens as the only systemic therapy. It also predicts minimal to no benefit from chemotherapy  (4) status post bilateral mastectomies with bilateral sentinel lymph node sampling 12/26/2015, showing  (a) on the right, no residual carcinoma (0/3 nodes involved)  (b) on the left, focal ductal carcinoma in situ, 0.2 cm, with negative margins; (0/4 nodes involved)  (5) adjuvant radiation 02/11/16 - 03/27/16    1)  Right chest wall: 50.4 Gy in 28 fractions               2) Right chest wall boost: 10 Gy in 5 fractions   (6) started on tamoxifen neoadjuvantly 10/23/2015 to allow for expected delays in definitive local treatment  (a) Mirena IUD in place  (b) tamoxifen discontinued 08/13/2017, with progression  (7) since she had 4 lymph nodes removed from each axilla but also receive radiation on the right, lab draws should preferentially be obtained from the left arm   METASTATIC DISEASE: April 2019 (8) CT scan of the abdomen and pelvis 08/04/2017, MRI of the liver and total spine MRI 08/11/2017 showed liver and bone metastases  (a) liver biopsy 09/07/2017 confirms estrogen receptor positive, progesterone receptor negative, HER-2 negative metastatic breast cancer  (b) chest CT scan 08/13/2017 shows multiple bilateral pulmonary nodules.  (c) CT of the brain with and without contrast 08/26/2017 shows no evidence of metastatic disease to the brain  (d) CA 27-29 is informative  (9) fulvestrant started 08/17/2017  (a) palbociclib started 08/31/2017  (b) fulvestrant and palbociclib discontinued 01/04/2018 with evidence of progression in the liver  (10) denosumab/Xgeva started 09/14/2017  (a) held after 03/01/2018  dose because concern re. osteonecrosis  (b) resumed 06/09/2018  (c) 02/21/2019 dose held due to oral surgery, resumed 03/21/2019  (11) anastrozole started 01/04/2018  (a) everolimus started 01/29/2018  (b) liver MRI and bone scan December 2019 show stable disease  (c) chest CT scan on 08/02/2018 shows lung lesions resolved, bone lesions stable  (d) bone scan 09/13/2018 essentially stable  (e) liver MRI 12/08/2018 shows improvement in hepatic metastsases  (f) liver MRI and bone scan 04/13/2019 showed continuing improvement in the liver, stable bone lesions  (g) chest CT 08/01/2019 stable, no evidence of active disease ows  (12) 01/28/18 prophylactic left femur intramedullary nail surgery for impending left femur pathologic fracture  (a) palliative radiation with Dr. [name] to the left femur and left ilium from 02/16/2018 to 03/01/2018  (13) [facility] report on liver biopsy from April 2019 shows a negative PDL 1, stable MSI and proficient mismatch repair status.  The tumor is positive for the androgen receptor, and for PTEN for PIK3 CA mutations.  HER-2/neu was read as equivocal (it was negative by FISH on the pathology report).  (14) Progression noted in liver MRI on 01/11/2020; fulvestrant started on 01/30/2020  (a) alpelisib started 01/30/2020 at 300 mg daily with Metformin 500 mg daily  (b) alpelisib held on 02/07/2020 with very high blood sugars.  Metformin increased to 1 g daily  (c) alpelisib resumed at 150 mg daily on 02/13/2020  (d) liver MRI 05/06/2020 shows evidence of disease progression  (e) fulvestrant and alpelisib discontinued January 2022  (15) capecitabine started 05/22/2020 at 1.5 g twice daily, 14/7  (a) abdominal MRI 08/23/2020 shows worsening liver disease  (b) chest CT scan 09/04/2020 shows new lung metastases  (c) capecitabine discontinued April 2022  (16) paclitaxel started 09/10/2020, repeated days 1, 8, and 15 of each 28-day cycle  (A) changed to every 2 weeks beginning  with cycle 3 (11/05/2020)   PLAN: Neilani is tolerating Taxol every 2 weeks quite well and there is no peripheral neuropathy.  Clinically she has some issues including the low back pain, shortness of breath, which I think is going to be secondary to deconditioning, and continuing weight gain.  I think she would benefit from aquatic therapy and I am referring her to physical therapy without in mind.  For the low back pain I suggested she try Tylenol plus Aleve if she has something to do.  She understands the goal of pain medication is to allow her to do more.  I commended her continuing to work full-time but she needs to take a little time off for herself for physical therapy as needed.  I am seeing her every 4weeks at the start of each treatment cycle.  Before I see her February 11, 2021 she will be restaged with an MRI of the abdomen and a CT of the chest.  Total encounter time 35 minutes.Sarajane Jews C. Lanay Zinda, MD 12/31/20 8:44 AM Medical Oncology and Hematology [facility] [place], [place] [address] Tel. [phone]    Fax. [phone]   I, Wilburn Mylar, am acting as scribe for Dr. Virgie Dad. Adalin Vanderploeg.  I, Lurline Del MD, have reviewed the above documentation for accuracy and completeness, and I agree with the above.   *Total Encounter Time as defined by the Centers for Medicare and Medicaid Services includes, in addition to the face-to-face time of a patient visit (documented in the note above) non-face-to-face time: obtaining and reviewing outside history, ordering and reviewing medications, tests or procedures, care coordination (communications with other health care professionals or caregivers) and documentation in the medical record.

## 2020-12-31 ENCOUNTER — Inpatient Hospital Stay: Payer: 59

## 2020-12-31 ENCOUNTER — Other Ambulatory Visit: Payer: Self-pay

## 2020-12-31 ENCOUNTER — Inpatient Hospital Stay (HOSPITAL_BASED_OUTPATIENT_CLINIC_OR_DEPARTMENT_OTHER): Payer: 59 | Admitting: Oncology

## 2020-12-31 ENCOUNTER — Other Ambulatory Visit (HOSPITAL_COMMUNITY): Payer: Self-pay

## 2020-12-31 VITALS — BP 147/74 | HR 89 | Temp 97.7°F | Resp 18 | Ht 66.0 in | Wt 299.0 lb

## 2020-12-31 DIAGNOSIS — C50211 Malignant neoplasm of upper-inner quadrant of right female breast: Secondary | ICD-10-CM | POA: Diagnosis not present

## 2020-12-31 DIAGNOSIS — C50112 Malignant neoplasm of central portion of left female breast: Secondary | ICD-10-CM | POA: Diagnosis not present

## 2020-12-31 DIAGNOSIS — Z79810 Long term (current) use of selective estrogen receptor modulators (SERMs): Secondary | ICD-10-CM | POA: Diagnosis not present

## 2020-12-31 DIAGNOSIS — Z170 Estrogen receptor positive status [ER+]: Secondary | ICD-10-CM | POA: Diagnosis not present

## 2020-12-31 DIAGNOSIS — C787 Secondary malignant neoplasm of liver and intrahepatic bile duct: Secondary | ICD-10-CM | POA: Diagnosis not present

## 2020-12-31 DIAGNOSIS — Z5111 Encounter for antineoplastic chemotherapy: Secondary | ICD-10-CM | POA: Diagnosis not present

## 2020-12-31 DIAGNOSIS — Z95828 Presence of other vascular implants and grafts: Secondary | ICD-10-CM

## 2020-12-31 DIAGNOSIS — C7800 Secondary malignant neoplasm of unspecified lung: Secondary | ICD-10-CM | POA: Diagnosis not present

## 2020-12-31 DIAGNOSIS — Z923 Personal history of irradiation: Secondary | ICD-10-CM | POA: Diagnosis not present

## 2020-12-31 DIAGNOSIS — C7951 Secondary malignant neoplasm of bone: Secondary | ICD-10-CM | POA: Diagnosis not present

## 2020-12-31 DIAGNOSIS — Z9013 Acquired absence of bilateral breasts and nipples: Secondary | ICD-10-CM | POA: Diagnosis not present

## 2020-12-31 LAB — CBC WITH DIFFERENTIAL/PLATELET
Abs Immature Granulocytes: 0.02 10*3/uL (ref 0.00–0.07)
Basophils Absolute: 0 10*3/uL (ref 0.0–0.1)
Basophils Relative: 0 %
Eosinophils Absolute: 0.1 10*3/uL (ref 0.0–0.5)
Eosinophils Relative: 2 %
HCT: 31.2 % — ABNORMAL LOW (ref 36.0–46.0)
Hemoglobin: 10.2 g/dL — ABNORMAL LOW (ref 12.0–15.0)
Immature Granulocytes: 0 %
Lymphocytes Relative: 14 %
Lymphs Abs: 0.7 10*3/uL (ref 0.7–4.0)
MCH: 30.4 pg (ref 26.0–34.0)
MCHC: 32.7 g/dL (ref 30.0–36.0)
MCV: 92.9 fL (ref 80.0–100.0)
Monocytes Absolute: 0.5 10*3/uL (ref 0.1–1.0)
Monocytes Relative: 10 %
Neutro Abs: 3.5 10*3/uL (ref 1.7–7.7)
Neutrophils Relative %: 74 %
Platelets: 244 10*3/uL (ref 150–400)
RBC: 3.36 MIL/uL — ABNORMAL LOW (ref 3.87–5.11)
RDW: 17.2 % — ABNORMAL HIGH (ref 11.5–15.5)
WBC: 4.7 10*3/uL (ref 4.0–10.5)
nRBC: 0 % (ref 0.0–0.2)

## 2020-12-31 LAB — COMPREHENSIVE METABOLIC PANEL
ALT: 15 U/L (ref 0–44)
AST: 13 U/L — ABNORMAL LOW (ref 15–41)
Albumin: 3.1 g/dL — ABNORMAL LOW (ref 3.5–5.0)
Alkaline Phosphatase: 119 U/L (ref 38–126)
Anion gap: 10 (ref 5–15)
BUN: 11 mg/dL (ref 6–20)
CO2: 29 mmol/L (ref 22–32)
Calcium: 8.8 mg/dL — ABNORMAL LOW (ref 8.9–10.3)
Chloride: 104 mmol/L (ref 98–111)
Creatinine, Ser: 0.84 mg/dL (ref 0.44–1.00)
GFR, Estimated: >60 mL/min (ref >60)
Glucose, Bld: 103 mg/dL — ABNORMAL HIGH (ref 70–99)
Potassium: 3.3 mmol/L — ABNORMAL LOW (ref 3.5–5.1)
Sodium: 143 mmol/L (ref 135–145)
Total Bilirubin: 0.3 mg/dL (ref 0.3–1.2)
Total Protein: 6.4 g/dL — ABNORMAL LOW (ref 6.5–8.1)

## 2020-12-31 MED ORDER — SODIUM CHLORIDE 0.9 % IV SOLN
10.0000 mg | Freq: Once | INTRAVENOUS | Status: AC
  Administered 2020-12-31: 10 mg via INTRAVENOUS
  Filled 2020-12-31: qty 10

## 2020-12-31 MED ORDER — HEPARIN SOD (PORK) LOCK FLUSH 100 UNIT/ML IV SOLN
500.0000 [IU] | Freq: Once | INTRAVENOUS | Status: AC | PRN
  Administered 2020-12-31: 500 [IU]

## 2020-12-31 MED ORDER — SODIUM CHLORIDE 0.9 % IV SOLN
80.0000 mg/m2 | Freq: Once | INTRAVENOUS | Status: AC
  Administered 2020-12-31: 192 mg via INTRAVENOUS
  Filled 2020-12-31: qty 32

## 2020-12-31 MED ORDER — SODIUM CHLORIDE 0.9% FLUSH
10.0000 mL | Freq: Once | INTRAVENOUS | Status: AC
Start: 2020-12-31 — End: 2020-12-31
  Administered 2020-12-31: 10 mL via INTRAVENOUS

## 2020-12-31 MED ORDER — SODIUM CHLORIDE 0.9 % IV SOLN: Freq: Once | INTRAVENOUS | Status: AC

## 2020-12-31 MED ORDER — DIPHENHYDRAMINE HCL 50 MG/ML IJ SOLN
25.0000 mg | Freq: Once | INTRAMUSCULAR | Status: AC
Start: 2020-12-31 — End: 2020-12-31
  Administered 2020-12-31: 25 mg via INTRAVENOUS
  Filled 2020-12-31: qty 1

## 2020-12-31 MED ORDER — SODIUM CHLORIDE 0.9% FLUSH
10.0000 mL | INTRAVENOUS | Status: DC | PRN
  Administered 2020-12-31: 10 mL

## 2020-12-31 MED ORDER — FAMOTIDINE 20 MG IN NS 100 ML IVPB
20.0000 mg | Freq: Once | INTRAVENOUS | Status: AC
  Administered 2020-12-31: 20 mg via INTRAVENOUS
  Filled 2020-12-31: qty 100

## 2020-12-31 MED FILL — Sertraline HCl Tab 100 MG: ORAL | 30 days supply | Qty: 30 | Fill #4 | Status: AC

## 2020-12-31 NOTE — Patient Instructions (Signed)
[facility]   Discharge Instructions: Thank you for choosing [facility] to provide your oncology and hematology care.   If you have a lab appointment with the Cancer Center, please go directly to the Cancer Center and check in at the registration area.   Wear comfortable clothing and clothing appropriate for easy access to any Portacath or PICC line.   We strive to give you quality time with your provider. You may need to reschedule your appointment if you arrive late (15 or more minutes).  Arriving late affects you and other patients whose appointments are after yours.  Also, if you miss three or more appointments without notifying the office, you may be dismissed from the clinic at the provider's discretion.      For prescription refill requests, have your pharmacy contact our office and allow 72 hours for refills to be completed.    Today you received the following chemotherapy and/or immunotherapy agents: paclitaxel.      To help prevent nausea and vomiting after your treatment, we encourage you to take your nausea medication as directed.  BELOW ARE SYMPTOMS THAT SHOULD BE REPORTED IMMEDIATELY: *FEVER GREATER THAN 100.4 F (38 C) OR HIGHER *CHILLS OR SWEATING *NAUSEA AND VOMITING THAT IS NOT CONTROLLED WITH YOUR NAUSEA MEDICATION *UNUSUAL SHORTNESS OF BREATH *UNUSUAL BRUISING OR BLEEDING *URINARY PROBLEMS (pain or burning when urinating, or frequent urination) *BOWEL PROBLEMS (unusual diarrhea, constipation, pain near the anus) TENDERNESS IN MOUTH AND THROAT WITH OR WITHOUT PRESENCE OF ULCERS (sore throat, sores in mouth, or a toothache) UNUSUAL RASH, SWELLING OR PAIN  UNUSUAL VAGINAL DISCHARGE OR ITCHING   Items with * indicate a potential emergency and should be followed up as soon as possible or go to the Emergency Department if any problems should occur.  Please show the CHEMOTHERAPY ALERT CARD or IMMUNOTHERAPY ALERT CARD at check-in  to the Emergency Department and triage nurse.  Should you have questions after your visit or need to cancel or reschedule your appointment, please contact [facility]  Dept: [phone]  and follow the prompts.  Office hours are 8:00 a.m. to 4:30 p.m. Monday - Friday. Please note that voicemails left after 4:00 p.m. may not be returned until the following business day.  We are closed weekends and major holidays. You have access to a nurse at all times for urgent questions. Please call the main number to the clinic Dept: [phone] and follow the prompts.   For any non-urgent questions, you may also contact your provider using MyChart. We now offer e-Visits for anyone 18 and older to request care online for non-urgent symptoms. For details visit [email or web address].   Also download the MyChart app! Go to the app store, search "MyChart", open the app, select [facility], and log in with your MyChart username and password.  Due to Covid, a mask is required upon entering the hospital/clinic. If you do not have a mask, one will be given to you upon arrival. For doctor visits, patients may have 1 support person aged 18 or older with them. For treatment visits, patients cannot have anyone with them due to current Covid guidelines and our immunocompromised population.   

## 2021-01-01 LAB — CANCER ANTIGEN 27.29: CA 27.29: 100.9 U/mL — ABNORMAL HIGH (ref 0.0–38.6)

## 2021-01-02 DIAGNOSIS — E0500 Thyrotoxicosis with diffuse goiter without thyrotoxic crisis or storm: Secondary | ICD-10-CM | POA: Diagnosis not present

## 2021-01-02 DIAGNOSIS — C50919 Malignant neoplasm of unspecified site of unspecified female breast: Secondary | ICD-10-CM | POA: Diagnosis not present

## 2021-01-02 DIAGNOSIS — I10 Essential (primary) hypertension: Secondary | ICD-10-CM | POA: Diagnosis not present

## 2021-01-02 DIAGNOSIS — C7951 Secondary malignant neoplasm of bone: Secondary | ICD-10-CM | POA: Diagnosis not present

## 2021-01-02 DIAGNOSIS — D0510 Intraductal carcinoma in situ of unspecified breast: Secondary | ICD-10-CM | POA: Diagnosis not present

## 2021-01-02 DIAGNOSIS — E7800 Pure hypercholesterolemia, unspecified: Secondary | ICD-10-CM | POA: Diagnosis not present

## 2021-01-02 DIAGNOSIS — E039 Hypothyroidism, unspecified: Secondary | ICD-10-CM | POA: Diagnosis not present

## 2021-01-02 DIAGNOSIS — E1169 Type 2 diabetes mellitus with other specified complication: Secondary | ICD-10-CM | POA: Diagnosis not present

## 2021-01-02 DIAGNOSIS — C787 Secondary malignant neoplasm of liver and intrahepatic bile duct: Secondary | ICD-10-CM | POA: Diagnosis not present

## 2021-01-07 ENCOUNTER — Other Ambulatory Visit: Payer: Self-pay

## 2021-01-07 ENCOUNTER — Ambulatory Visit: Payer: 59 | Attending: Hematology and Oncology

## 2021-01-07 DIAGNOSIS — C50919 Malignant neoplasm of unspecified site of unspecified female breast: Secondary | ICD-10-CM | POA: Insufficient documentation

## 2021-01-07 DIAGNOSIS — C7800 Secondary malignant neoplasm of unspecified lung: Secondary | ICD-10-CM | POA: Diagnosis not present

## 2021-01-07 DIAGNOSIS — G8929 Other chronic pain: Secondary | ICD-10-CM | POA: Diagnosis not present

## 2021-01-07 DIAGNOSIS — M6281 Muscle weakness (generalized): Secondary | ICD-10-CM | POA: Diagnosis not present

## 2021-01-07 DIAGNOSIS — R293 Abnormal posture: Secondary | ICD-10-CM | POA: Insufficient documentation

## 2021-01-07 DIAGNOSIS — M545 Low back pain, unspecified: Secondary | ICD-10-CM | POA: Insufficient documentation

## 2021-01-07 NOTE — Therapy (Signed)
[place], [place], [address] Phone: [phone]   Fax:  [phone]  Physical Therapy Evaluation  Patient Details  Name: Debbie [name] MRN: [id number] Date of Birth: [birth year] Referring Provider (PT): Dr. [name]   Encounter Date: 01/07/2021   PT End of Session - 01/07/21 1403     Visit Number 1    Number of Visits 12    Date for PT Re-Evaluation 02/18/21    PT Start Time 1300    PT Stop Time 1350    PT Time Calculation (min) 50 min    Activity Tolerance Patient tolerated treatment well    Behavior During Therapy [facility] for tasks assessed/performed             Past Medical History:  Diagnosis Date   Anxiety    Arthritis    Bone metastasis ([place])    Breast cancer of upper-inner quadrant of right female breast [facility]) oncologist-  dr [name]--- 04/ 2019 ER/PR+ Stage IV w/ metastatic disease to liver, total spine, lung, bone   dx 06/ 2017  right breast invasive ductal carcinoma, Stage IIB, ER/PR + (pT2 pN1), grade 1-- s/p  right breast lumpecotmy w/ sln bx's and bilateral breast reduction 11-28-2015/  left breast with reduction , dx invasive ductal ca, Grade 1 (pT1cpNX) ER/PR positive/  adjuvant radiation completed 03-27-2016 to right chest wall, 2018  reclassifiec Stage IIA   Cancer, metastatic to liver ([place])    Chronic back pain    Constipation    Depression    Family history of adverse reaction to anesthesia     mother has Copd was kept on vent    Headache    Heart murmur    History of hyperthyroidism    s/p  RAI   History of radiation therapy 02/11/16- 03/27/16   Right Chest Wall 50.4 Gy in 28 fractions, Right Chest Wall Boost 10 Gy in 5 fractions.    Hyperlipidemia    Hypertension    Hypothyroidism, postradioiodine therapy    endocrinoloigst-  dr balan--  2004  s/p  RAI   Menorrhagia 2011   Multiple pulmonary nodules    secondary  metastatic   PONV (postoperative nausea and  vomiting)    patch helps    SUI (stress urinary incontinence, female)    Wears contact lenses    Wears dentures    full upper and lower partial    Past Surgical History:  Procedure Laterality Date   BREAST LUMPECTOMY WITH NEEDLE LOCALIZATION AND AXILLARY SENTINEL LYMPH NODE BX Right 11/28/2015   Procedure: RIGHT BREAST LUMPECTOMY WITH NEEDLE LOCALIZATION AND AXILLARY SENTINEL LYMPH NODE BX;  Surgeon: Autumn Messing III, MD;  Location: [facility];  Service: General;  Laterality: Right;   BREAST REDUCTION SURGERY Bilateral 11/28/2015   Procedure: MAMMARY REDUCTION  (BREAST) BILATERAL;  Surgeon: Crissie Reese, MD;  Location: [facility];  Service: Plastics;  Laterality: Bilateral;   CESAREAN SECTION  01/12/1993   COLONOSCOPY     Dental surgeries     FEMUR IM NAIL Left 01/28/2018   Procedure: INTRAMEDULLARY (IM) NAIL FEMORAL;  Surgeon: Nicholes Stairs, MD;  Location: [facility];  Service: Orthopedics;  Laterality: Left;   HYSTEROSCOPY N/A 09/02/2017   Procedure: HYSTEROSCOPY  to remove IUD;  Surgeon: Eldred Manges, MD;  Location: [facility];  Service: Gynecology;  Laterality: N/A;   IR IMAGING GUIDED PORT INSERTION  09/16/2020   Liver biospy     MASTECTOMY  MASTECTOMY W/ SENTINEL NODE BIOPSY Bilateral 12/26/2015   Procedure: BILATERAL MASTECTOMY WITH LEFT SENTINEL LYMPH NODE BIOPSY;  Surgeon: Autumn Messing III, MD;  Location: [facility];  Service: General;  Laterality: Bilateral;   Removal of IUD     TUBAL LIGATION Bilateral 1995    There were no vitals filed for this visit.    Subjective Assessment - 01/07/21 1257     Subjective I am concerned about doing water therapy because I am not sure how my legs will be because of the neuropathy. Has had back pain for years but it used to go away, but it has gotten worse over the last couple of years.  LBP is all the way across, and sometimes more on one side than the other.  Sometimes her legs feel heavy and its worse with longer periods of time.  If  I can get rid of the back pain my life would be better.  Has right UE lymphedema that has been pretty stable.  Has day and night garments, and pump to control.As soon as she gets up in the am her face, hands, feet etc start  swelling.  Pain in back 0-10/10 at worst then takes pain meds which alleviates pain and makes more tolerable    Pertinent History Stage IV breast Cancer s/p Bilateral Mastectomies with METS to Liver, spine, lung, Bone.  Has left  Femur IM rod.  Presently on Paclitaxel Day 1 and 15 of 28 day cycle.  Stable Right UE lymphedema. 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 unlimted, stiff when she rises    How long can you stand comfortably? 5-10 min then has to sit or lean    How long can you walk comfortably? several hundred yards.    Patient Stated Goals Decrease pain, improve mobility to perform activities better    Currently in Pain? Yes    Pain Score 3     Pain Location Back    Pain Orientation Left;Lower    Pain Descriptors / Indicators Heaviness;Nagging    Pain Type Chronic pain    Pain Onset More than a month ago    Pain Frequency Intermittent    Aggravating Factors  walking, standing,bending, turning, personal care, putting lotion on feet    Pain Relieving Factors sitting, laying                [facility] PT Assessment - 01/07/21 0001       Assessment   Medical Diagnosis Bilateral Breast CA with METS    Referring Provider (PT) Dr. [name]    Hand Dominance Right      Precautions   Precaution Comments Breast CA with METS, Right UE lymphedema      Balance Screen   Has the patient fallen in the past 6 months No    Has the patient had a decrease in activity level because of a fear of falling?  No    Is the patient reluctant to leave their home because of a fear of falling?  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      Cognition   Overall Cognitive Status Within Functional Limits for tasks assessed      Posture/Postural Control   Posture/Postural Control Postural limitations    Postural Limitations Rounded Shoulders;Forward head      AROM   Lumbar Flexion to knees   tight   Lumbar Extension Decreased 30% tight    Lumbar - Right Side Bend WNL    Lumbar - Left Side Bend WNL tight    Lumbar - Right Rotation WNL tight    Lumbar - Left Rotation WNL tight      Strength   Right Hip Flexion 4/5    Right Hip External Rotation  4/5    Right Hip Internal Rotation 4/5    Right Hip ABduction 4/5    Right Hip ADduction 4+/5    Left Hip Flexion 3+/5    Left Hip External Rotation 4/5    Left Hip Internal Rotation 4/5    Left Hip ABduction 4/5    Left Hip ADduction 4+/5    Right Knee Flexion 5/5    Right Knee Extension 5/5      Palpation   Palpation comment hypersensitive bilateral lower extremities stocking distribution but not from chemo,                Objective measurements completed on examination: See above findings.       PT Long Term Goals - 01/07/21 1352       PT LONG TERM GOAL #1   Title Pt will be independent with HEP for lumbar, core and generalized strength    Time 6    Period Weeks    Status New    Target Date 02/18/21      PT LONG TERM GOAL #2   Title Pt will have LBP decreased by atleast 50%    Time 6    Period Weeks    Status New    Target Date 02/18/21      PT LONG TERM GOAL #3   Title pt will be able to shower without sitting on shower chair    Time 6    Period Weeks    Status New    Target Date 02/18/21      PT LONG TERM GOAL #4   Title pt will be able to wash dishes for 10-20 minutes with minimal complaints  of LBP    Time 6    Period Weeks    Target Date 02/18/21      PT LONG TERM GOAL #5   Title pt will be able to rise from chair without pain or stiffness    Time 6    Period Weeks    Status New    Target Date 02/18/21                    Plan - 01/07/21 1404     Clinical Impression Statement Pt. presents to therapy s/p bilateral Breast Mastectomies with stage IV Cancer and METS to liver, spine, lung, bones.  She is having chemo 2xs per month.  She has had back pain for years, worse over the last several years.  Dr. [name] had suggested aquatic therapy as an option for her. Pain can range intermittently from 0-10/10 and when it reaches a 10 she takes meds.  She is still working full time at [employer].  Lumbar ROM was without pain today but pt reported tightness with all  motions.  She has limitations in bilateral LE strength.  Functional limitations include having to sit to shower, stiffness when rising from sitting, limited standing and walking tolerance secondary to pain.    Personal Factors and Comorbidities Comorbidity 3+    Comorbidities Bilateral mastectomies for Breast CA with radiation, Bone and liver METS, right UE lymphedema    Examination-Activity Limitations Bend;Hygiene/Grooming;Stand;Lift    Stability/Clinical Decision Making Evolving/Moderate complexity    Clinical Decision Making Low    Rehab Potential Good    PT Frequency 2x / week    PT Treatment/Interventions Aquatic Therapy;ADLs/Self Care Home Management;Therapeutic exercise;Neuromuscular re-education;Manual techniques;Patient/family education;Therapeutic activities    PT Next Visit Plan set up aquatic visits at ortho front desk, 1x per week with [facility]as, 1x here, Initiate low level spinal stab to start, LE flexibility, body mechanics    Recommended Other Services aquatic therapy    Consulted and Agree with Plan of Care Patient             Patient will benefit from skilled therapeutic intervention in order to  improve the following deficits and impairments:  Difficulty walking, Pain, Decreased knowledge of precautions, Decreased activity tolerance, Postural dysfunction, Decreased strength, Decreased mobility  Visit Diagnosis: Abnormal posture  Chronic bilateral low back pain without sciatica  Muscle weakness (generalized)  Primary malignant neoplasm of breast with metastasis to other site, unspecified laterality ([place])  Malignant neoplasm metastatic to lung, unspecified laterality [facility])     Problem List Patient Active Problem List   Diagnosis Date Noted   Encounter for antineoplastic chemotherapy 10/08/2020   Lower extremity edema 10/08/2020   Lung metastases ([place]) 09/10/2020   Goals of care, counseling/discussion 12/27/2018   Osteonecrosis ([place]) 11/01/2018   Pathological fracture in neoplastic disease, left femur, initial encounter for fracture ([place]) 01/28/2018   Morbid obesity with BMI of 40.0-44.9, adult ([place]) 99991111   IUD complication ([place]) [id number]   Bone metastases ([place]) 08/13/2017   Pain from bone metastases ([place]) 08/13/2017   Liver metastases ([place]) 08/13/2017   Steatosis of liver 08/13/2017   Bilateral malignant neoplasm of breast in female, estrogen receptor positive ([place]) 12/26/2015   Malignant neoplasm of central portion of left breast in female, estrogen receptor positive ([place]) 11/28/2015   Malignant neoplasm of upper-inner quadrant of right breast in female, estrogen receptor positive ([place]) 10/17/2015   Hypertension 04/19/2012   Hypothyroid 04/19/2012   Fibroids 04/19/2012   Stress incontinence, female 04/19/2012    Claris Pong 01/07/2021, 3:10 PM  [place], [place], [address] Phone: [phone]   Fax:  [phone]  Name: Debbie [name] MRN: [id number] Date of Birth: 1966-04-21 Cheral Almas, PT 01/07/21 3:15 PM

## 2021-01-09 ENCOUNTER — Ambulatory Visit: Payer: 59 | Admitting: Rehabilitation

## 2021-01-09 ENCOUNTER — Encounter: Payer: Self-pay | Admitting: Rehabilitation

## 2021-01-09 ENCOUNTER — Other Ambulatory Visit: Payer: Self-pay

## 2021-01-09 DIAGNOSIS — C787 Secondary malignant neoplasm of liver and intrahepatic bile duct: Secondary | ICD-10-CM | POA: Diagnosis not present

## 2021-01-09 DIAGNOSIS — M6281 Muscle weakness (generalized): Secondary | ICD-10-CM

## 2021-01-09 DIAGNOSIS — C50919 Malignant neoplasm of unspecified site of unspecified female breast: Secondary | ICD-10-CM

## 2021-01-09 DIAGNOSIS — Z79899 Other long term (current) drug therapy: Secondary | ICD-10-CM | POA: Diagnosis not present

## 2021-01-09 DIAGNOSIS — C7951 Secondary malignant neoplasm of bone: Secondary | ICD-10-CM | POA: Diagnosis not present

## 2021-01-09 DIAGNOSIS — M545 Low back pain, unspecified: Secondary | ICD-10-CM

## 2021-01-09 DIAGNOSIS — G8929 Other chronic pain: Secondary | ICD-10-CM | POA: Insufficient documentation

## 2021-01-09 DIAGNOSIS — R293 Abnormal posture: Secondary | ICD-10-CM | POA: Insufficient documentation

## 2021-01-09 DIAGNOSIS — I972 Postmastectomy lymphedema syndrome: Secondary | ICD-10-CM | POA: Insufficient documentation

## 2021-01-09 DIAGNOSIS — Z9013 Acquired absence of bilateral breasts and nipples: Secondary | ICD-10-CM | POA: Diagnosis not present

## 2021-01-09 DIAGNOSIS — C7800 Secondary malignant neoplasm of unspecified lung: Secondary | ICD-10-CM

## 2021-01-09 DIAGNOSIS — G629 Polyneuropathy, unspecified: Secondary | ICD-10-CM | POA: Diagnosis not present

## 2021-01-09 DIAGNOSIS — Z923 Personal history of irradiation: Secondary | ICD-10-CM | POA: Diagnosis not present

## 2021-01-09 DIAGNOSIS — Z170 Estrogen receptor positive status [ER+]: Secondary | ICD-10-CM | POA: Diagnosis not present

## 2021-01-09 DIAGNOSIS — C50211 Malignant neoplasm of upper-inner quadrant of right female breast: Secondary | ICD-10-CM | POA: Diagnosis not present

## 2021-01-09 DIAGNOSIS — Z5111 Encounter for antineoplastic chemotherapy: Secondary | ICD-10-CM | POA: Diagnosis not present

## 2021-01-09 NOTE — Patient Instructions (Signed)
Access Code: [email or web address] URL: [email or web address]: 09/01/2022Prepared by: Marcene Brawn TevisExercises  Seated Transversus Abdominis Bracing - 1 x daily - 7 x weekly - 1 sets - 10 reps - 5 second hold  Supine Diaphragmatic Breathing - 1-2 x daily - 7 x weekly - 10 reps  Supine Transversus Abdominis Bracing - Hands on Stomach - 1 x daily - 7 x weekly - 1 sets - 10 reps - 5 second hold  Supine Shoulder Protraction with [place] - 1 x daily - 7 x weekly - 1 sets - 10 reps - no hold hold

## 2021-01-09 NOTE — Therapy (Signed)
[place], [place], [address] Phone: [phone]   Fax:  [phone]  Physical Therapy Treatment  Patient Details  Name: Debbie [name] MRN: [id number] Date of Birth: 1965/10/25 Referring Provider (PT): Dr. [name]   Encounter Date: 01/09/2021   PT End of Session - 01/09/21 1341     Visit Number 2    Number of Visits 12    Date for PT Re-Evaluation 02/18/21    PT Start Time 1304    PT Stop Time 1334    PT Time Calculation (min) 30 min    Activity Tolerance Patient tolerated treatment well             Past Medical History:  Diagnosis Date   Anxiety    Arthritis    Bone metastasis ([place])    Breast cancer of upper-inner quadrant of right female breast [facility]) oncologist-  dr [name]--- 04/ 2019 ER/PR+ Stage IV w/ metastatic disease to liver, total spine, lung, bone   dx 06/ 2017  right breast invasive ductal carcinoma, Stage IIB, ER/PR + (pT2 pN1), grade 1-- s/p  right breast lumpecotmy w/ sln bx's and bilateral breast reduction 11-28-2015/  left breast with reduction , dx invasive ductal ca, Grade 1 (pT1cpNX) ER/PR positive/  adjuvant radiation completed 03-27-2016 to right chest wall, 2018  reclassifiec Stage IIA   Cancer, metastatic to liver ([place])    Chronic back pain    Constipation    Depression    Family history of adverse reaction to anesthesia     mother has Copd was kept on vent    Headache    Heart murmur    History of hyperthyroidism    s/p  RAI   History of radiation therapy 02/11/16- 03/27/16   Right Chest Wall 50.4 Gy in 28 fractions, Right Chest Wall Boost 10 Gy in 5 fractions.    Hyperlipidemia    Hypertension    Hypothyroidism, postradioiodine therapy    endocrinoloigst-  dr balan--  2004  s/p  RAI   Menorrhagia 2011   Multiple pulmonary nodules    secondary  metastatic   PONV (postoperative nausea and vomiting)    patch helps    SUI (stress urinary incontinence,  female)    Wears contact lenses    Wears dentures    full upper and lower partial    Past Surgical History:  Procedure Laterality Date   BREAST LUMPECTOMY WITH NEEDLE LOCALIZATION AND AXILLARY SENTINEL LYMPH NODE BX Right 11/28/2015   Procedure: RIGHT BREAST LUMPECTOMY WITH NEEDLE LOCALIZATION AND AXILLARY SENTINEL LYMPH NODE BX;  Surgeon: Autumn Messing III, MD;  Location: [facility];  Service: General;  Laterality: Right;   BREAST REDUCTION SURGERY Bilateral 11/28/2015   Procedure: MAMMARY REDUCTION  (BREAST) BILATERAL;  Surgeon: Crissie Reese, MD;  Location: [facility];  Service: Plastics;  Laterality: Bilateral;   CESAREAN SECTION  01/12/1993   COLONOSCOPY     Dental surgeries     FEMUR IM NAIL Left 01/28/2018   Procedure: INTRAMEDULLARY (IM) NAIL FEMORAL;  Surgeon: Nicholes Stairs, MD;  Location: [facility];  Service: Orthopedics;  Laterality: Left;   HYSTEROSCOPY N/A 09/02/2017   Procedure: HYSTEROSCOPY  to remove IUD;  Surgeon: Eldred Manges, MD;  Location: [facility];  Service: Gynecology;  Laterality: N/A;   IR IMAGING GUIDED PORT INSERTION  09/16/2020   Liver biospy     MASTECTOMY     MASTECTOMY W/ SENTINEL NODE BIOPSY Bilateral 12/26/2015  Procedure: BILATERAL MASTECTOMY WITH LEFT SENTINEL LYMPH NODE BIOPSY;  Surgeon: Autumn Messing III, MD;  Location: [facility];  Service: General;  Laterality: Bilateral;   Removal of IUD     TUBAL LIGATION Bilateral 1995    There were no vitals filed for this visit.   Subjective Assessment - 01/09/21 1250     Subjective Nothing new    Pertinent History Stage IV breast Cancer s/p Bilateral Mastectomies with METS to Liver, spine, lung, Bone.  Has left  Femur IM rod.  Presently on Paclitaxel Day 1 and 15 of 28 day cycle.  Stable Right UE lymphedema. 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 Yes    Pain Score 5     Pain Location Back    Pain Orientation Lower    Pain Descriptors / Indicators Heaviness;Nagging    Pain Type  Chronic pain    Pain Onset More than a month ago    Pain Frequency Intermittent                       Flowsheet Row Outpatient Rehab from 12/08/2018 in [facility]  Lymphedema Life Impact Scale Total Score 11.76 %             [facility] Adult PT Treatment/Exercise - 01/09/21 0001       Self-Care   Self-Care Other Self-Care Comments    Other Self-Care Comments  discussion about avoiding heaving axial loading and torsion quickly with spinal bone mets as well as strategies for pain reilef with stenosis type symptoms which pt is already using as well as activity in small bursts.  Pt already has a TENS so we discussed trying in on the lower back      Exercises   Exercises Lumbar      Lumbar Exercises: Supine   Ab Set 5 reps;5 seconds    AB Set Limitations supine and seated with education on TrA contraction using tight button as vcs    Bent Knee Raise 5 reps    Bent Knee Raise Limitations with TrA small lift of 4"    Basic Lumbar Stabilization 5 reps    Basic Lumbar Stabilization Limitations TrA activation with overhead dowel press and relaxation with each rep    Other Supine Lumbar Exercises supine diaphragmatic breathing x 10 breaths                    PT Education - 01/09/21 1340     Education Details HEP, exercise with METS, posture, log roll    Person(s) Educated Patient    Methods Explanation;Demonstration;Tactile cues;Verbal cues;Handout    Comprehension Verbalized understanding;Returned demonstration;Verbal cues required;Tactile cues required;Need further instruction                 PT Long Term Goals - 01/07/21 1352       PT LONG TERM GOAL #1   Title Pt will be independent with HEP for lumbar, core and generalized strength    Time 6    Period Weeks    Status New    Target Date 02/18/21      PT LONG TERM GOAL #2   Title Pt will have LBP decreased by atleast 50%    Time 6    Period Weeks    Status  New    Target Date 02/18/21      PT LONG TERM GOAL #3   Title pt will be  able to shower without sitting on shower chair    Time 6    Period Weeks    Status New    Target Date 02/18/21      PT LONG TERM GOAL #4   Title pt will be able to wash dishes for 10-20 minutes with minimal complaints of LBP    Time 6    Period Weeks    Target Date 02/18/21      PT LONG TERM GOAL #5   Title pt will be able to rise from chair without pain or stiffness    Time 6    Period Weeks    Status New    Target Date 02/18/21                   Plan - 01/09/21 1341     Clinical Impression Statement First session today with focus on education about exercise with lumbar and thoracic METS, stenosis self care and pain relief and basic lumbar stabilization.  Pt has signs of stenosis that appear after walking around 62mn causing her legs to get heavy and then improved with a brief rest.  Pt is knowledgeable about breaking activities into smaller segments.  No increased pain with lumbar stabilization today and pt scheduled for the pool.    PT Frequency 2x / week    PT Treatment/Interventions Aquatic Therapy;ADLs/Self Care Home Management;Therapeutic exercise;Neuromuscular re-education;Manual techniques;Patient/family education;Therapeutic activities    PT Next Visit Plan (*Bone METS: lumbar, thoracic spine, sternum, ribs, Rt ischium) cont low level spinal stab to start, LE flexibility, body mechanics    Consulted and Agree with Plan of Care Patient             Patient will benefit from skilled therapeutic intervention in order to improve the following deficits and impairments:     Visit Diagnosis: Chronic bilateral low back pain without sciatica  Abnormal posture  Muscle weakness (generalized)  Primary malignant neoplasm of breast with metastasis to other site, unspecified laterality ([facility]  Malignant neoplasm metastatic to lung, unspecified laterality ([facility]  Postmastectomy  lymphedema     Problem List Patient Active Problem List   Diagnosis Date Noted   Encounter for antineoplastic chemotherapy 10/08/2020   Lower extremity edema 10/08/2020   Lung metastases ([facility] 09/10/2020   Goals of care, counseling/discussion 12/27/2018   Osteonecrosis ([facility] 11/01/2018   Pathological fracture in neoplastic disease, left femur, initial encounter for fracture ([facility] 01/28/2018   Morbid obesity with BMI of 40.0-44.9, adult ([facility] [id number]  IUD complication ([facility] [id number]  Bone metastases ([facility] 08/13/2017   Pain from bone metastases ([facility] 08/13/2017   Liver metastases ([facility] 08/13/2017   Steatosis of liver 08/13/2017   Bilateral malignant neoplasm of breast in female, estrogen receptor positive ([facility] 12/26/2015   Malignant neoplasm of central portion of left breast in female, estrogen receptor positive ([facility] 11/28/2015   Malignant neoplasm of upper-inner quadrant of right breast in female, estrogen receptor positive ([facility] 10/17/2015   Hypertension 04/19/2012   Hypothyroid 04/19/2012   Fibroids 04/19/2012   Stress incontinence, female 04/19/2012    TStark Bray9/05/2020, 1:45 PM  [place] [id number]: [phone]  Fax:  [phone] Name: NLATOYYA SHAINMRN: [id number] of Birth: [birth year]

## 2021-01-10 MED FILL — Dexamethasone Sodium Phosphate Inj 100 MG/10ML: INTRAMUSCULAR | Qty: 1 | Status: AC

## 2021-01-14 ENCOUNTER — Inpatient Hospital Stay: Payer: 59 | Attending: Oncology

## 2021-01-14 ENCOUNTER — Other Ambulatory Visit: Payer: Self-pay | Admitting: Oncology

## 2021-01-14 ENCOUNTER — Ambulatory Visit: Payer: 59

## 2021-01-14 ENCOUNTER — Other Ambulatory Visit (HOSPITAL_COMMUNITY): Payer: Self-pay

## 2021-01-14 ENCOUNTER — Other Ambulatory Visit: Payer: 59

## 2021-01-14 ENCOUNTER — Other Ambulatory Visit: Payer: Self-pay

## 2021-01-14 ENCOUNTER — Inpatient Hospital Stay: Payer: 59

## 2021-01-14 VITALS — BP 144/67 | HR 91 | Temp 98.5°F | Resp 16 | Wt 298.0 lb

## 2021-01-14 DIAGNOSIS — Z170 Estrogen receptor positive status [ER+]: Secondary | ICD-10-CM | POA: Diagnosis not present

## 2021-01-14 DIAGNOSIS — Z79899 Other long term (current) drug therapy: Secondary | ICD-10-CM | POA: Insufficient documentation

## 2021-01-14 DIAGNOSIS — C7800 Secondary malignant neoplasm of unspecified lung: Secondary | ICD-10-CM | POA: Insufficient documentation

## 2021-01-14 DIAGNOSIS — Z9013 Acquired absence of bilateral breasts and nipples: Secondary | ICD-10-CM | POA: Diagnosis not present

## 2021-01-14 DIAGNOSIS — C50211 Malignant neoplasm of upper-inner quadrant of right female breast: Secondary | ICD-10-CM

## 2021-01-14 DIAGNOSIS — Z5111 Encounter for antineoplastic chemotherapy: Secondary | ICD-10-CM | POA: Diagnosis not present

## 2021-01-14 DIAGNOSIS — Z923 Personal history of irradiation: Secondary | ICD-10-CM | POA: Diagnosis not present

## 2021-01-14 DIAGNOSIS — C50112 Malignant neoplasm of central portion of left female breast: Secondary | ICD-10-CM

## 2021-01-14 DIAGNOSIS — Z95828 Presence of other vascular implants and grafts: Secondary | ICD-10-CM

## 2021-01-14 DIAGNOSIS — C7951 Secondary malignant neoplasm of bone: Secondary | ICD-10-CM | POA: Insufficient documentation

## 2021-01-14 DIAGNOSIS — C787 Secondary malignant neoplasm of liver and intrahepatic bile duct: Secondary | ICD-10-CM

## 2021-01-14 DIAGNOSIS — G629 Polyneuropathy, unspecified: Secondary | ICD-10-CM | POA: Insufficient documentation

## 2021-01-14 LAB — CBC WITH DIFFERENTIAL/PLATELET
Abs Immature Granulocytes: 0.02 10*3/uL (ref 0.00–0.07)
Basophils Absolute: 0 10*3/uL (ref 0.0–0.1)
Basophils Relative: 0 %
Eosinophils Absolute: 0.1 10*3/uL (ref 0.0–0.5)
Eosinophils Relative: 2 %
HCT: 33 % — ABNORMAL LOW (ref 36.0–46.0)
Hemoglobin: 10.6 g/dL — ABNORMAL LOW (ref 12.0–15.0)
Immature Granulocytes: 0 %
Lymphocytes Relative: 13 %
Lymphs Abs: 0.7 10*3/uL (ref 0.7–4.0)
MCH: 30.5 pg (ref 26.0–34.0)
MCHC: 32.1 g/dL (ref 30.0–36.0)
MCV: 95.1 fL (ref 80.0–100.0)
Monocytes Absolute: 0.4 10*3/uL (ref 0.1–1.0)
Monocytes Relative: 7 %
Neutro Abs: 3.9 10*3/uL (ref 1.7–7.7)
Neutrophils Relative %: 78 %
Platelets: 243 10*3/uL (ref 150–400)
RBC: 3.47 MIL/uL — ABNORMAL LOW (ref 3.87–5.11)
RDW: 17.6 % — ABNORMAL HIGH (ref 11.5–15.5)
WBC: 5.1 10*3/uL (ref 4.0–10.5)
nRBC: 0 % (ref 0.0–0.2)

## 2021-01-14 LAB — COMPREHENSIVE METABOLIC PANEL
ALT: 15 U/L (ref 0–44)
AST: 14 U/L — ABNORMAL LOW (ref 15–41)
Albumin: 3.3 g/dL — ABNORMAL LOW (ref 3.5–5.0)
Alkaline Phosphatase: 113 U/L (ref 38–126)
Anion gap: 12 (ref 5–15)
BUN: 13 mg/dL (ref 6–20)
CO2: 27 mmol/L (ref 22–32)
Calcium: 9.5 mg/dL (ref 8.9–10.3)
Chloride: 102 mmol/L (ref 98–111)
Creatinine, Ser: 1.04 mg/dL — ABNORMAL HIGH (ref 0.44–1.00)
GFR, Estimated: >60 mL/min (ref >60)
Glucose, Bld: 153 mg/dL — ABNORMAL HIGH (ref 70–99)
Potassium: 3.6 mmol/L (ref 3.5–5.1)
Sodium: 141 mmol/L (ref 135–145)
Total Bilirubin: 0.4 mg/dL (ref 0.3–1.2)
Total Protein: 6.8 g/dL (ref 6.5–8.1)

## 2021-01-14 MED ORDER — SODIUM CHLORIDE 0.9% FLUSH
10.0000 mL | INTRAVENOUS | Status: DC | PRN
  Administered 2021-01-14: 10 mL

## 2021-01-14 MED ORDER — SODIUM CHLORIDE 0.9% FLUSH
10.0000 mL | Freq: Once | INTRAVENOUS | Status: AC
  Administered 2021-01-14: 10 mL via INTRAVENOUS

## 2021-01-14 MED ORDER — DIPHENHYDRAMINE HCL 50 MG/ML IJ SOLN
INTRAMUSCULAR | Status: AC
  Filled 2021-01-14: qty 1

## 2021-01-14 MED ORDER — DENOSUMAB 120 MG/1.7ML ~~LOC~~ SOLN
120.0000 mg | Freq: Once | SUBCUTANEOUS | Status: AC
Start: 2021-01-14 — End: 2021-01-14
  Administered 2021-01-14: 120 mg via SUBCUTANEOUS
  Filled 2021-01-14: qty 1.7

## 2021-01-14 MED ORDER — DIPHENHYDRAMINE HCL 50 MG/ML IJ SOLN
25.0000 mg | Freq: Once | INTRAMUSCULAR | Status: AC
  Administered 2021-01-14: 25 mg via INTRAVENOUS

## 2021-01-14 MED ORDER — PREGABALIN 100 MG PO CAPS
100.0000 mg | ORAL_CAPSULE | Freq: Three times a day (TID) | ORAL | 0 refills | Status: DC
  Filled 2021-01-14: qty 90, 30d supply, fill #0

## 2021-01-14 MED ORDER — FAMOTIDINE 20 MG IN NS 100 ML IVPB
20.0000 mg | Freq: Once | INTRAVENOUS | Status: AC
  Administered 2021-01-14: 20 mg via INTRAVENOUS

## 2021-01-14 MED ORDER — SODIUM CHLORIDE 0.9 % IV SOLN
10.0000 mg | Freq: Once | INTRAVENOUS | Status: AC
  Administered 2021-01-14: 10 mg via INTRAVENOUS
  Filled 2021-01-14: qty 10

## 2021-01-14 MED ORDER — FAMOTIDINE 20 MG IN NS 100 ML IVPB
INTRAVENOUS | Status: AC
  Filled 2021-01-14: qty 100

## 2021-01-14 MED ORDER — HEPARIN SOD (PORK) LOCK FLUSH 100 UNIT/ML IV SOLN
500.0000 [IU] | Freq: Once | INTRAVENOUS | Status: AC | PRN
  Administered 2021-01-14: 500 [IU]

## 2021-01-14 MED ORDER — SODIUM CHLORIDE 0.9 % IV SOLN
80.0000 mg/m2 | Freq: Once | INTRAVENOUS | Status: AC
  Administered 2021-01-14: 192 mg via INTRAVENOUS
  Filled 2021-01-14: qty 32

## 2021-01-14 MED ORDER — SODIUM CHLORIDE 0.9 % IV SOLN: Freq: Once | INTRAVENOUS | Status: AC

## 2021-01-14 NOTE — Patient Instructions (Signed)
[facility]COL[facility]  Discharge Instructions: Thank you for choosing [facility] to provide your oncology and hematology care.   If you have a lab appointment with the [facility], please go directly to the [facility] and check in at the registration area.   Wear comfortable clothing and clothing appropriate for easy access to any Portacath or PICC line.   We strive to give you quality time with your provider. You may need to reschedule your appointment if you arrive late (15 or more minutes).  Arriving late affects you and other patients whose appointments are after yours.  Also, if you miss three or more appointments without notifying the office, you may be dismissed from the clinic at the provider's discretion.      For prescription refill requests, have your pharmacy contact our office and allow 72 hours for refills to be completed.    Today you received the following chemotherapy and/or immunotherapy agent: Paclitaxel (Taxol)   To help prevent nausea and vomiting after your treatment, we encourage you to take your nausea medication as directed.  BELOW ARE SYMPTOMS THAT SHOULD BE REPORTED IMMEDIATELY: *FEVER GREATER THAN 100.4 F (38 C) OR HIGHER *CHILLS OR SWEATING *NAUSEA AND VOMITING THAT IS NOT CONTROLLED WITH YOUR NAUSEA MEDICATION *UNUSUAL SHORTNESS OF BREATH *UNUSUAL BRUISING OR BLEEDING *URINARY PROBLEMS (pain or burning when urinating, or frequent urination) *BOWEL PROBLEMS (unusual diarrhea, constipation, pain near the anus) TENDERNESS IN MOUTH AND THROAT WITH OR WITHOUT PRESENCE OF ULCERS (sore throat, sores in mouth, or a toothache) UNUSUAL RASH, SWELLING OR PAIN  UNUSUAL VAGINAL DISCHARGE OR ITCHING   Items with * indicate a potential emergency and should be followed up as soon as possible or go to the Emergency Department if any problems should occur.  Please show the CHEMOTHERAPY ALERT CARD or IMMUNOTHERAPY ALERT CARD at  check-in to the Emergency Department and triage nurse.  Should you have questions after your visit or need to cancel or reschedule your appointment, please contact [facility]  Dept: [phone]  and follow the prompts.  Office hours are 8:00 a.m. to 4:30 p.m. Monday - Friday. Please note that voicemails left after 4:00 p.m. may not be returned until the following business day.  We are closed weekends and major holidays. You have access to a nurse at all times for urgent questions. Please call the main number to the clinic Dept: [phone] and follow the prompts.   For any non-urgent questions, you may also contact your provider using MyChart. We now offer e-Visits for anyone 55 and older to request care online for non-urgent symptoms. For details visit [email or web address].   Also download the MyChart app! Go to the app store, search "MyChart", open the app, select [facility], and log in with your MyChart username and password.  Due to Covid, a mask is required upon entering the hospital/clinic. If you do not have a mask, one will be given to you upon arrival. For doctor visits, patients may have 1 support person aged 41 or older with them. For treatment visits, patients cannot have anyone with them due to current Covid guidelines and our immunocompromised population.   Denosumab injection What is this medication? DENOSUMAB (den oh sue mab) slows bone breakdown. Prolia is used to treat osteoporosis in women after menopause and in men, and in people who are taking corticosteroids for 6 months or more. Delton See is used to treat a high calcium level due to cancer  and to prevent bone fractures and other bone problems caused by multiple myeloma or cancer bone metastases. Delton See is also used to treat giant cell tumor of the bone. This medicine may be used for other purposes; ask your health care provider or pharmacist if you have questions. COMMON BRAND NAME(S): Prolia,  XGEVA What should I tell my care team before I take this medication? They need to know if you have any of these conditions: dental disease having surgery or tooth extraction infection kidney disease low levels of calcium or Vitamin D in the blood malnutrition on hemodialysis skin conditions or sensitivity thyroid or parathyroid disease an unusual reaction to denosumab, other medicines, foods, dyes, or preservatives pregnant or trying to get pregnant breast-feeding How should I use this medication? This medicine is for injection under the skin. It is given by a health care professional in a hospital or clinic setting. A special MedGuide will be given to you before each treatment. Be sure to read this information carefully each time. For Prolia, talk to your pediatrician regarding the use of this medicine in children. Special care may be needed. For Delton See, talk to your pediatrician regarding the use of this medicine in children. While this drug may be prescribed for children as young as 13 years for selected conditions, precautions do apply. Overdosage: If you think you have taken too much of this medicine contact a poison control center or emergency room at once. NOTE: This medicine is only for you. Do not share this medicine with others. What if I miss a dose? It is important not to miss your dose. Call your doctor or health care professional if you are unable to keep an appointment. What may interact with this medication? Do not take this medicine with any of the following medications: other medicines containing denosumab This medicine may also interact with the following medications: medicines that lower your chance of fighting infection steroid medicines like prednisone or cortisone This list may not describe all possible interactions. Give your health care provider a list of all the medicines, herbs, non-prescription drugs, or dietary supplements you use. Also tell them if you smoke,  drink alcohol, or use illegal drugs. Some items may interact with your medicine. What should I watch for while using this medication? Visit your doctor or health care professional for regular checks on your progress. Your doctor or health care professional may order blood tests and other tests to see how you are doing. Call your doctor or health care professional for advice if you get a fever, chills or sore throat, or other symptoms of a cold or flu. Do not treat yourself. This drug may decrease your body's ability to fight infection. Try to avoid being around people who are sick. You should make sure you get enough calcium and vitamin D while you are taking this medicine, unless your doctor tells you not to. Discuss the foods you eat and the vitamins you take with your health care professional. See your dentist regularly. Brush and floss your teeth as directed. Before you have any dental work done, tell your dentist you are receiving this medicine. Do not become pregnant while taking this medicine or for 5 months after stopping it. Talk with your doctor or health care professional about your birth control options while taking this medicine. Women should inform their doctor if they wish to become pregnant or think they might be pregnant. There is a potential for serious side effects to an unborn child. Talk to  your health care professional or pharmacist for more information. What side effects may I notice from receiving this medication? Side effects that you should report to your doctor or health care professional as soon as possible: allergic reactions like skin rash, itching or hives, swelling of the face, lips, or tongue bone pain breathing problems dizziness jaw pain, especially after dental work redness, blistering, peeling of the skin signs and symptoms of infection like fever or chills; cough; sore throat; pain or trouble passing urine signs of low calcium like fast heartbeat, muscle cramps  or muscle pain; pain, tingling, numbness in the hands or feet; seizures unusual bleeding or bruising unusually weak or tired Side effects that usually do not require medical attention (report to your doctor or health care professional if they continue or are bothersome): constipation diarrhea headache joint pain loss of appetite muscle pain runny nose tiredness upset stomach This list may not describe all possible side effects. Call your doctor for medical advice about side effects. You may report side effects to FDA at 1-800-FDA-1088. Where should I keep my medication? This medicine is only given in a clinic, doctor's office, or other health care setting and will not be stored at home. NOTE: This sheet is a summary. It may not cover all possible information. If you have questions about this medicine, talk to your doctor, pharmacist, or health care provider.  2022 Elsevier/Gold Standard (2017-09-03 16:10:44)

## 2021-01-15 ENCOUNTER — Other Ambulatory Visit (HOSPITAL_COMMUNITY): Payer: Self-pay

## 2021-01-15 LAB — CANCER ANTIGEN 27.29: CA 27.29: 115.1 U/mL — ABNORMAL HIGH (ref 0.0–38.6)

## 2021-01-16 ENCOUNTER — Other Ambulatory Visit (HOSPITAL_COMMUNITY): Payer: Self-pay

## 2021-01-16 DIAGNOSIS — E890 Postprocedural hypothyroidism: Secondary | ICD-10-CM | POA: Diagnosis not present

## 2021-01-16 DIAGNOSIS — R7301 Impaired fasting glucose: Secondary | ICD-10-CM | POA: Diagnosis not present

## 2021-01-20 ENCOUNTER — Other Ambulatory Visit (HOSPITAL_COMMUNITY): Payer: Self-pay

## 2021-01-20 MED ORDER — LEVOTHYROXINE SODIUM 175 MCG PO TABS
175.0000 ug | ORAL_TABLET | Freq: Every morning | ORAL | 6 refills | Status: DC
  Filled 2021-01-20 – 2021-01-22 (×2): qty 30, 30d supply, fill #0
  Filled 2021-02-18: qty 30, 30d supply, fill #1
  Filled 2021-03-20: qty 90, 90d supply, fill #2
  Filled 2021-06-17: qty 90, 90d supply, fill #3
  Filled 2021-06-18: qty 60, 60d supply, fill #3

## 2021-01-21 ENCOUNTER — Ambulatory Visit: Payer: 59 | Admitting: Physical Therapy

## 2021-01-21 ENCOUNTER — Encounter (HOSPITAL_BASED_OUTPATIENT_CLINIC_OR_DEPARTMENT_OTHER): Payer: Self-pay | Admitting: Physical Therapy

## 2021-01-21 ENCOUNTER — Other Ambulatory Visit (HOSPITAL_COMMUNITY): Payer: Self-pay

## 2021-01-21 ENCOUNTER — Other Ambulatory Visit: Payer: Self-pay

## 2021-01-21 ENCOUNTER — Encounter: Payer: Self-pay | Admitting: Physical Therapy

## 2021-01-21 ENCOUNTER — Ambulatory Visit (HOSPITAL_BASED_OUTPATIENT_CLINIC_OR_DEPARTMENT_OTHER): Payer: 59 | Admitting: Physical Therapy

## 2021-01-21 DIAGNOSIS — R293 Abnormal posture: Secondary | ICD-10-CM

## 2021-01-21 DIAGNOSIS — M6281 Muscle weakness (generalized): Secondary | ICD-10-CM

## 2021-01-21 DIAGNOSIS — Z923 Personal history of irradiation: Secondary | ICD-10-CM | POA: Diagnosis not present

## 2021-01-21 DIAGNOSIS — Z9013 Acquired absence of bilateral breasts and nipples: Secondary | ICD-10-CM | POA: Diagnosis not present

## 2021-01-21 DIAGNOSIS — M545 Low back pain, unspecified: Secondary | ICD-10-CM

## 2021-01-21 DIAGNOSIS — G8929 Other chronic pain: Secondary | ICD-10-CM

## 2021-01-21 DIAGNOSIS — C787 Secondary malignant neoplasm of liver and intrahepatic bile duct: Secondary | ICD-10-CM | POA: Diagnosis not present

## 2021-01-21 DIAGNOSIS — C50919 Malignant neoplasm of unspecified site of unspecified female breast: Secondary | ICD-10-CM

## 2021-01-21 DIAGNOSIS — Z170 Estrogen receptor positive status [ER+]: Secondary | ICD-10-CM | POA: Diagnosis not present

## 2021-01-21 DIAGNOSIS — C7951 Secondary malignant neoplasm of bone: Secondary | ICD-10-CM | POA: Diagnosis not present

## 2021-01-21 DIAGNOSIS — G629 Polyneuropathy, unspecified: Secondary | ICD-10-CM | POA: Diagnosis not present

## 2021-01-21 DIAGNOSIS — C50211 Malignant neoplasm of upper-inner quadrant of right female breast: Secondary | ICD-10-CM | POA: Diagnosis not present

## 2021-01-21 DIAGNOSIS — Z5111 Encounter for antineoplastic chemotherapy: Secondary | ICD-10-CM | POA: Diagnosis not present

## 2021-01-21 DIAGNOSIS — C7800 Secondary malignant neoplasm of unspecified lung: Secondary | ICD-10-CM | POA: Diagnosis not present

## 2021-01-21 NOTE — Therapy (Signed)
[place], [place], [address] Phone: [phone]   Fax:  [phone]  Physical Therapy Treatment  Patient Details  Name: Debbie [name] MRN: [id number] Date of Birth: January 17, 1966 Referring Provider (PT): Dr. [name]   Encounter Date: 01/21/2021   PT End of Session - 01/21/21 1455     Visit Number 4    Number of Visits 12    Date for PT Re-Evaluation 02/18/21    PT Start Time 1406    PT Stop Time [id number]    PT Time Calculation (min) 48 min    Activity Tolerance Patient tolerated treatment well    Behavior During Therapy [facility] for tasks assessed/performed             Past Medical History:  Diagnosis Date   Anxiety    Arthritis    Bone metastasis ([place])    Breast cancer of upper-inner quadrant of right female breast [facility]) oncologist-  dr [name]--- 04/ 2019 ER/PR+ Stage IV w/ metastatic disease to liver, total spine, lung, bone   dx 06/ 2017  right breast invasive ductal carcinoma, Stage IIB, ER/PR + (pT2 pN1), grade 1-- s/p  right breast lumpecotmy w/ sln bx's and bilateral breast reduction 11-28-2015/  left breast with reduction , dx invasive ductal ca, Grade 1 (pT1cpNX) ER/PR positive/  adjuvant radiation completed 03-27-2016 to right chest wall, 2018  reclassifiec Stage IIA   Cancer, metastatic to liver ([place])    Chronic back pain    Constipation    Depression    Family history of adverse reaction to anesthesia     mother has Copd was kept on vent    Headache    Heart murmur    History of hyperthyroidism    s/p  RAI   History of radiation therapy 02/11/16- 03/27/16   Right Chest Wall 50.4 Gy in 28 fractions, Right Chest Wall Boost 10 Gy in 5 fractions.    Hyperlipidemia    Hypertension    Hypothyroidism, postradioiodine therapy    endocrinoloigst-  dr balan--  2004  s/p  RAI   Menorrhagia 2011   Multiple pulmonary nodules    secondary  metastatic   PONV (postoperative nausea and  vomiting)    patch helps    SUI (stress urinary incontinence, female)    Wears contact lenses    Wears dentures    full upper and lower partial    Past Surgical History:  Procedure Laterality Date   BREAST LUMPECTOMY WITH NEEDLE LOCALIZATION AND AXILLARY SENTINEL LYMPH NODE BX Right 11/28/2015   Procedure: RIGHT BREAST LUMPECTOMY WITH NEEDLE LOCALIZATION AND AXILLARY SENTINEL LYMPH NODE BX;  Surgeon: Autumn Messing III, MD;  Location: [place];  Service: General;  Laterality: Right;   BREAST REDUCTION SURGERY Bilateral 11/28/2015   Procedure: MAMMARY REDUCTION  (BREAST) BILATERAL;  Surgeon: Crissie Reese, MD;  Location: [place];  Service: Plastics;  Laterality: Bilateral;   CESAREAN SECTION  01/12/1993   COLONOSCOPY     Dental surgeries     FEMUR IM NAIL Left 01/28/2018   Procedure: INTRAMEDULLARY (IM) NAIL FEMORAL;  Surgeon: Nicholes Stairs, MD;  Location: [place];  Service: Orthopedics;  Laterality: Left;   HYSTEROSCOPY N/A 09/02/2017   Procedure: HYSTEROSCOPY  to remove IUD;  Surgeon: Eldred Manges, MD;  Location: [place];  Service: Gynecology;  Laterality: N/A;   IR IMAGING GUIDED PORT INSERTION  09/16/2020   Liver biospy     MASTECTOMY  MASTECTOMY W/ SENTINEL NODE BIOPSY Bilateral 12/26/2015   Procedure: BILATERAL MASTECTOMY WITH LEFT SENTINEL LYMPH NODE BIOPSY;  Surgeon: Autumn Messing III, MD;  Location: [place];  Service: General;  Laterality: Bilateral;   Removal of IUD     TUBAL LIGATION Bilateral 1995    There were no vitals filed for this visit.   Subjective Assessment - 01/21/21 1408     Subjective My back feels pretty good. I haven't really been walking. It felt good to get in to the water.    Pertinent History Stage IV breast Cancer s/p Bilateral Mastectomies with METS to Liver, spine, lung, Bone.  Has left  Femur IM rod.  Presently on Paclitaxel Day 1 and 15 of 28 day cycle.  Stable Right UE lymphedema. 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 unlimted, stiff when she rises    How long can you stand comfortably? 5-10 min then has to sit or lean    How long can you walk comfortably? several hundred yards.    Patient Stated Goals Decrease pain, improve mobility to perform activities better    Currently in Pain? No/denies    Pain Score 0-No pain                       Flowsheet Row Outpatient Rehab from 12/08/2018 in [place]  Lymphedema Life Impact Scale Total Score 11.76 %             [facility] Adult PT Treatment/Exercise - 01/21/21 0001       Lumbar Exercises: Stretches   Active Hamstring Stretch 1 rep;60 seconds   seated edge of mat   Other Lumbar Stretch Exercise educated pt in Meeks decompression exercises and had her hold all exercises for 3 sec and do 5 reps      Lumbar Exercises: Supine   Ab Set 10 reps;5 seconds    AB Set Limitations v/c to coordinate with diaphramatic breathing and on how to get proper core contraction    Clam 15 reps    Bent Knee Raise 10 reps    Bent Knee Raise Limitations with TrA small lift of 4" and 3 sec holds    Basic Lumbar Stabilization 10 reps    Basic Lumbar Stabilization Limitations TrA activation with overhead dowel press and relaxation with each rep                          PT Long Term Goals - 01/07/21 1352       PT LONG TERM GOAL #1   Title Pt will be independent with HEP for lumbar, core and generalized strength    Time 6    Period Weeks    Status New    Target Date 02/18/21      PT LONG TERM GOAL #2   Title Pt will have LBP decreased by atleast 50%    Time 6    Period Weeks    Status New    Target Date 02/18/21      PT LONG TERM GOAL #3   Title pt will be able to shower without sitting on shower chair    Time 6    Period Weeks    Status New    Target Date 02/18/21      PT LONG TERM GOAL #4   Title pt will be able to wash dishes for  10-20 minutes with minimal  complaints of LBP    Time 6    Period Weeks    Target Date 02/18/21      PT LONG TERM GOAL #5   Title pt will be able to rise from chair without pain or stiffness    Time 6    Period Weeks    Status New    Target Date 02/18/21                   Plan - 01/21/21 1457     Clinical Impression Statement Pt educated in Meeks decompression exercises to help reduce back pain. Continued to instruct pt in core strengthening exercises working with pt on coordinating diaphragmatic breathing with core contraction and ensuring pt is tightening core muscles instead of pushing her muscles out. Pt demonstrating increasing independence with this today. By end of session pt having some back discomfort but she also had already done pool therapy today and was very fatigued. Issued meeks decompression exercises as part of HEP.    PT Frequency 2x / week    PT Duration 4 weeks    PT Treatment/Interventions Aquatic Therapy;ADLs/Self Care Home Management;Therapeutic exercise;Neuromuscular re-education;Manual techniques;Patient/family education;Therapeutic activities    PT Next Visit Plan (*Bone METS: lumbar, thoracic spine, sternum, ribs, Rt ischium) cont low level spinal stab to start, LE flexibility, body mechanics    Consulted and Agree with Plan of Care Patient             Patient will benefit from skilled therapeutic intervention in order to improve the following deficits and impairments:  Difficulty walking, Pain, Decreased knowledge of precautions, Decreased activity tolerance, Postural dysfunction, Decreased strength, Decreased mobility  Visit Diagnosis: Chronic bilateral low back pain without sciatica  Abnormal posture  Muscle weakness (generalized)  Primary malignant neoplasm of breast with metastasis to other site, unspecified laterality [facility])     Problem List Patient Active Problem List   Diagnosis Date Noted   Encounter for antineoplastic chemotherapy 10/08/2020   Lower  extremity edema 10/08/2020   Lung metastases ([place]) 09/10/2020   Goals of care, counseling/discussion 12/27/2018   Osteonecrosis ([place]) 11/01/2018   Pathological fracture in neoplastic disease, left femur, initial encounter for fracture ([place]) 01/28/2018   Morbid obesity with BMI of 40.0-44.9, adult ([place]) 99991111   IUD complication ([place]) 123XX123   Bone metastases ([place]) 08/13/2017   Pain from bone metastases ([place]) 08/13/2017   Liver metastases ([place]) 08/13/2017   Steatosis of liver 08/13/2017   Bilateral malignant neoplasm of breast in female, estrogen receptor positive ([place]) 12/26/2015   Malignant neoplasm of central portion of left breast in female, estrogen receptor positive ([place]) 11/28/2015   Malignant neoplasm of upper-inner quadrant of right breast in female, estrogen receptor positive ([place]) 10/17/2015   Hypertension 04/19/2012   Hypothyroid 04/19/2012   Fibroids 04/19/2012   Stress incontinence, female 04/19/2012    Manus Gunning, PT 01/21/2021, 3:00 PM  [place], [place], [address] Phone: [phone]   Fax:  [phone]  Name: Debbie [name] MRN: [id number] Date of Birth: 1966/02/21   Manus Gunning, PT 01/21/21 3:00 PM

## 2021-01-21 NOTE — Therapy (Signed)
[facility] [address] [place], [place], [address] Phone: [phone]   Fax:  [phone]  Physical Therapy Treatment  Patient Details  Name: Debbie [name] MRN: [id number] Date of Birth: 1965/11/14 Referring Provider (PT): Dr. [name]   Encounter Date: 01/21/2021   PT End of Session - 01/21/21 1038     Visit Number 3    Number of Visits 12    Date for PT Re-Evaluation 02/18/21    PT Start Time [id number]    PT Stop Time 1115    PT Time Calculation (min) 38 min    Equipment Utilized During Treatment Other (comment)    Activity Tolerance Patient tolerated treatment well    Behavior During Therapy [facility] for tasks assessed/performed             Past Medical History:  Diagnosis Date   Anxiety    Arthritis    Bone metastasis ([place])    Breast cancer of upper-inner quadrant of right female breast [facility]) oncologist-  dr [name]--- 04/ 2019 ER/PR+ Stage IV w/ metastatic disease to liver, total spine, lung, bone   dx 06/ 2017  right breast invasive ductal carcinoma, Stage IIB, ER/PR + (pT2 pN1), grade 1-- s/p  right breast lumpecotmy w/ sln bx's and bilateral breast reduction 11-28-2015/  left breast with reduction , dx invasive ductal ca, Grade 1 (pT1cpNX) ER/PR positive/  adjuvant radiation completed 03-27-2016 to right chest wall, 2018  reclassifiec Stage IIA   Cancer, metastatic to liver ([place])    Chronic back pain    Constipation    Depression    Family history of adverse reaction to anesthesia     mother has Copd was kept on vent    Headache    Heart murmur    History of hyperthyroidism    s/p  RAI   History of radiation therapy 02/11/16- 03/27/16   Right Chest Wall 50.4 Gy in 28 fractions, Right Chest Wall Boost 10 Gy in 5 fractions.    Hyperlipidemia    Hypertension    Hypothyroidism, postradioiodine therapy    endocrinoloigst-  dr balan--  2004  s/p  RAI   Menorrhagia 2011   Multiple pulmonary nodules     secondary  metastatic   PONV (postoperative nausea and vomiting)    patch helps    SUI (stress urinary incontinence, female)    Wears contact lenses    Wears dentures    full upper and lower partial    Past Surgical History:  Procedure Laterality Date   BREAST LUMPECTOMY WITH NEEDLE LOCALIZATION AND AXILLARY SENTINEL LYMPH NODE BX Right 11/28/2015   Procedure: RIGHT BREAST LUMPECTOMY WITH NEEDLE LOCALIZATION AND AXILLARY SENTINEL LYMPH NODE BX;  Surgeon: Autumn Messing III, MD;  Location: [place];  Service: General;  Laterality: Right;   BREAST REDUCTION SURGERY Bilateral 11/28/2015   Procedure: MAMMARY REDUCTION  (BREAST) BILATERAL;  Surgeon: Crissie Reese, MD;  Location: [place];  Service: Plastics;  Laterality: Bilateral;   CESAREAN SECTION  01/12/1993   COLONOSCOPY     Dental surgeries     FEMUR IM NAIL Left 01/28/2018   Procedure: INTRAMEDULLARY (IM) NAIL FEMORAL;  Surgeon: Nicholes Stairs, MD;  Location: [place];  Service: Orthopedics;  Laterality: Left;   HYSTEROSCOPY N/A 09/02/2017   Procedure: HYSTEROSCOPY  to remove IUD;  Surgeon: Eldred Manges, MD;  Location: [place];  Service: Gynecology;  Laterality: N/A;   IR IMAGING GUIDED PORT INSERTION  09/16/2020  Liver biospy     MASTECTOMY     MASTECTOMY W/ SENTINEL NODE BIOPSY Bilateral 12/26/2015   Procedure: BILATERAL MASTECTOMY WITH LEFT SENTINEL LYMPH NODE BIOPSY;  Surgeon: Autumn Messing III, MD;  Location: [place];  Service: General;  Laterality: Bilateral;   Removal of IUD     TUBAL LIGATION Bilateral 1995    There were no vitals filed for this visit.   Subjective Assessment - 01/21/21 1041     Subjective "Took my time getting back to pool. Pain 10/10 while I was getting changed.  Gets better in the pool 3/10"    Currently in Pain? Yes    Pain Score 3     Pain Location Back    Pain Orientation Lower    Pain Type Chronic pain                       Flowsheet Row Outpatient Rehab from 12/08/2018  in [place]  Lymphedema Life Impact Scale Total Score 11.76 %                      PT Education - 01/21/21 1116     Education Details Instruction on buoyancy, water pressure, benefits of aquatic intervention.    Person(s) Educated Patient    Methods Explanation;Demonstration    Comprehension Verbalized understanding                 PT Long Term Goals - 01/07/21 1352       PT LONG TERM GOAL #1   Title Pt will be independent with HEP for lumbar, core and generalized strength    Time 6    Period Weeks    Status New    Target Date 02/18/21      PT LONG TERM GOAL #2   Title Pt will have LBP decreased by atleast 50%    Time 6    Period Weeks    Status New    Target Date 02/18/21      PT LONG TERM GOAL #3   Title pt will be able to shower without sitting on shower chair    Time 6    Period Weeks    Status New    Target Date 02/18/21      PT LONG TERM GOAL #4   Title pt will be able to wash dishes for 10-20 minutes with minimal complaints of LBP    Time 6    Period Weeks    Target Date 02/18/21      PT LONG TERM GOAL #5   Title pt will be able to rise from chair without pain or stiffness    Time 6    Period Weeks    Status New    Target Date 02/18/21            Pt seen for aquatic therapy today.  Treatment took place in water 3.25-4.8 ft in depth at the [employer] [facility] pool. Temp of water was 91.  Pt entered/exited the pool via stairs step to pattern independently with bilat rail.  Warm up: forward, backward and side stepping/walking cues for increased step length, increased speed, hand placement to increase resistance. 6 widths  Introduction to environment, water walking throughout 3-5 ft.  Instruction on buoyancy, water pressure, benefits of aquatic intervention.  Seated Stretching: gastroc, adductors and hamstrings 5 x 20 second hold  Standing Strengthening and balance training: Supported  by yellow noodle: hip flex  x 10 -adduction/abd - hip ext x 10 ea. -High knee marching 3 x 10 reps -Kick board push down 3 x 10 reps  Water walking warm down VC for increased step length. X 6 widths.    Pt requires buoyancy for support and to offload joints with strengthening exercises. Viscosity of the water is needed for resistance of strengthening; water current perturbations provides challenge to standing balance unsupported, requiring increased core activation.        Plan - 01/21/21 1100     Clinical Impression Statement Pt introduced to aquatic environment moving throughout varying depths. Pt reports LLE heaviness with amb in pool  shallow and deep. She is comfortable in aquatic setting but fatigues quickly.  Overall pain report is decreased while submerged. Pt cued throughout for improved posture and core activation throughout session.    PT Treatment/Interventions Aquatic Therapy;ADLs/Self Care Home Management;Therapeutic exercise;Neuromuscular re-education;Manual techniques;Patient/family education;Therapeutic activities    PT Next Visit Plan advance strengthening of core and LE stretching             Patient will benefit from skilled therapeutic intervention in order to improve the following deficits and impairments:  Difficulty walking, Pain, Decreased knowledge of precautions, Decreased activity tolerance, Postural dysfunction, Decreased strength, Decreased mobility  Visit Diagnosis: Chronic bilateral low back pain without sciatica  Abnormal posture  Muscle weakness (generalized)  Primary malignant neoplasm of breast with metastasis to other site, unspecified laterality [facility])     Problem List Patient Active Problem List   Diagnosis Date Noted   Encounter for antineoplastic chemotherapy 10/08/2020   Lower extremity edema 10/08/2020   Lung metastases ([place]) 09/10/2020   Goals of care, counseling/discussion 12/27/2018   Osteonecrosis ([place]) 11/01/2018    Pathological fracture in neoplastic disease, left femur, initial encounter for fracture ([place]) 01/28/2018   Morbid obesity with BMI of 40.0-44.9, adult ([place]) 99991111   IUD complication ([place]) 123XX123   Bone metastases ([place]) 08/13/2017   Pain from bone metastases ([place]) 08/13/2017   Liver metastases ([place]) 08/13/2017   Steatosis of liver 08/13/2017   Bilateral malignant neoplasm of breast in female, estrogen receptor positive ([place]) 12/26/2015   Malignant neoplasm of central portion of left breast in female, estrogen receptor positive ([place]) 11/28/2015   Malignant neoplasm of upper-inner quadrant of right breast in female, estrogen receptor positive ([place]) 10/17/2015   Hypertension 04/19/2012   Hypothyroid 04/19/2012   Fibroids 04/19/2012   Stress incontinence, female 04/19/2012    Annamarie Major) Charmaine Placido MPT  01/21/2021, 3:39 PM  [facility] [address] [place], [place], [address] Phone: [phone]   Fax:  [phone]  Name: OLWYN ISLER MRN: [id number] Date of Birth: 05-13-1965

## 2021-01-21 NOTE — Patient Instructions (Signed)

## 2021-01-22 ENCOUNTER — Other Ambulatory Visit (HOSPITAL_COMMUNITY): Payer: Self-pay

## 2021-01-23 ENCOUNTER — Encounter: Payer: 59 | Admitting: Physical Therapy

## 2021-01-27 MED FILL — Dexamethasone Sodium Phosphate Inj 100 MG/10ML: INTRAMUSCULAR | Qty: 1 | Status: AC

## 2021-01-27 NOTE — Progress Notes (Signed)
[place]  [phone] [phone]    ID: Debbie [name] DOB: 1965/12/08  MR#: [id number]  OBS#:[id number]  Patient Care Team: Kelton Pillar, MD as PCP - General (Family Medicine) Jacelyn Pi, MD as Consulting Physician (Endocrinology) Jovita Kussmaul, MD as Consulting Physician (General Surgery) Leone Mobley, Virgie Dad, MD as Consulting Physician (Oncology) [name], MD as Attending Physician (Radiation Oncology) Belva Crome, MD as Consulting Physician (Cardiology) Crissie Reese, MD as Consulting Physician (Plastic Surgery) [name], [name], NP as Nurse Practitioner (Hematology and Oncology) Lajoyce Lauber, DMD (Dentistry) Raina Mina, RPH-CPP (Pharmacist) OTHER MD:   CHIEF COMPLAINT: Estrogen receptor positive stage IV  breast cancer (s/p bilateral mastectomies)  CURRENT TREATMENT: paclitaxel; denosumab/Xgeva   INTERVAL HISTORY: Aliany returns today for follow up and treatment of her stage IV breast cancer.  She was started on paclitaxel on 09/10/2020, initially 3 weeks on 1 week off.. She now receives treatments days 1 and 15 of each 28-day cycle. Today is day 1, cycle 6.  She is tolerating this generally well.  There has been Debbie progression in her peripheral neuropathy which somehow centers on the third toe of the left foot.  She has Debbie neuropathy in her hands.  She continues on Xgeva every 4 weeks, with her most recent dose on 01/14/2021.  She has Debbie side effects from this that she is aware of  We are following her CA 27-29: Lab Results  Component Value Date   CA2729 115.1 (H) 01/14/2021   CA2729 100.9 (H) 12/31/2020   CA2729 110.3 (H) 12/17/2020   CA2729 97.0 (H) 12/03/2020   CA2729 100.5 (H) 11/19/2020   Her most recent restaging study was 11/26/2020 chest CT, which showed clear evidence of improvement.  Her last MRI of the abdomen was 08/23/2020.   REVIEW OF SYSTEMS: Debbie [name] continues to work full-time.   She tells me she is going to be a grandmother with the baby coming in May 2023.  [name] is getting physical therapy for her back and doing some water therapy also at [facility].  She continues to use her compression sleeve on the right and that is maintaining that arm stable.  A detailed review of systems was otherwise noncontributory   COVID 19 VACCINATION STATUS: Status post Pfizer x2, with booster January 2021   BREAST CANCER HISTORY: From the original intake note:  Debbie [name] had screening mammography with tomosynthesis at the [facility] 10/02/2015. This showed 2 possible masses in the right breast as well as some suspicious calcifications. The left breast was unremarkable. On 10/15/2015 she underwent right diagnostic mammography with tomography and right breast ultrasonography. The breast density was category B. Spot magnification views confirmed an area of pleomorphic calcifications measuring up to 5 cm in the upper half of the breast. Also in the upper outer right breast there were adjacent low-density masses without distortion or calcifications. On physical exam there was Debbie palpable abnormality.   Targeted ultrasound of the right breast found the 2 "masses" in the right upper quadrant were benign cysts measuring 0.8 and 0.5 cm respectively. Biopsy of the area of calcification on 10/15/2015 showed ([id number]) invasive ductal carcinoma, E-cadherin positive, grade 2, estrogen receptor 90% positive, progesterone receptor 50% positive, both with strong staining intensity, with an MIB-1 of 70%, and Debbie HER-2 amplification, the signals ratio being 1.18 and the number per cell 2.60.   Her subsequent history is as detailed below    PAST MEDICAL HISTORY: Past Medical History:  Diagnosis Date   Anxiety    Arthritis    Bone metastasis ([place])    Breast cancer of upper-inner quadrant of right female breast [facility]) oncologist-  dr [name]--- 04/ 2019 ER/PR+ Stage IV w/ metastatic disease to liver,  total spine, lung, bone   dx 06/ 2017  right breast invasive ductal carcinoma, Stage IIB, ER/PR + (pT2 pN1), grade 1-- s/p  right breast lumpecotmy w/ sln bx's and bilateral breast reduction 11-28-2015/  left breast with reduction , dx invasive ductal ca, Grade 1 (pT1cpNX) ER/PR positive/  adjuvant radiation completed 03-27-2016 to right chest wall, 2018  reclassifiec Stage IIA   Cancer, metastatic to liver ([place])    Chronic back pain    Constipation    Depression    Family history of adverse reaction to anesthesia     mother has Copd was kept on vent    Headache    Heart murmur    History of hyperthyroidism    s/p  RAI   History of radiation therapy 02/11/16- 03/27/16   Right Chest Wall 50.4 Gy in 28 fractions, Right Chest Wall Boost 10 Gy in 5 fractions.    Hyperlipidemia    Hypertension    Hypothyroidism, postradioiodine therapy    endocrinoloigst-  dr balan--  2004  s/p  RAI   Menorrhagia 2011   Multiple pulmonary nodules    secondary  metastatic   PONV (postoperative nausea and vomiting)    patch helps    SUI (stress urinary incontinence, female)    Wears contact lenses    Wears dentures    full upper and lower partial    PAST SURGICAL HISTORY: Past Surgical History:  Procedure Laterality Date   BREAST LUMPECTOMY WITH NEEDLE LOCALIZATION AND AXILLARY SENTINEL LYMPH NODE BX Right 11/28/2015   Procedure: RIGHT BREAST LUMPECTOMY WITH NEEDLE LOCALIZATION AND AXILLARY SENTINEL LYMPH NODE BX;  Surgeon: Autumn Messing III, MD;  Location: [place];  Service: General;  Laterality: Right;   BREAST REDUCTION SURGERY Bilateral 11/28/2015   Procedure: MAMMARY REDUCTION  (BREAST) BILATERAL;  Surgeon: Crissie Reese, MD;  Location: [place];  Service: Plastics;  Laterality: Bilateral;   CESAREAN SECTION  01/12/1993   COLONOSCOPY     Dental surgeries     FEMUR IM NAIL Left 01/28/2018   Procedure: INTRAMEDULLARY (IM) NAIL FEMORAL;  Surgeon: Nicholes Stairs, MD;  Location: [place];  Service:  Orthopedics;  Laterality: Left;   HYSTEROSCOPY N/A 09/02/2017   Procedure: HYSTEROSCOPY  to remove IUD;  Surgeon: Eldred Manges, MD;  Location: [place];  Service: Gynecology;  Laterality: N/A;   IR IMAGING GUIDED PORT INSERTION  09/16/2020   Liver biospy     MASTECTOMY     MASTECTOMY W/ SENTINEL NODE BIOPSY Bilateral 12/26/2015   Procedure: BILATERAL MASTECTOMY WITH LEFT SENTINEL LYMPH NODE BIOPSY;  Surgeon: Autumn Messing III, MD;  Location: [place];  Service: General;  Laterality: Bilateral;   Removal of IUD     TUBAL LIGATION Bilateral 1995    FAMILY HISTORY: Family History  Problem Relation Age of Onset   Diabetes Mother    Hypertension Mother    Hypertension Father    Diabetes Maternal Grandmother    Hypertension Sister    Cancer Paternal Grandmother        colon   Colon cancer Paternal Grandmother    Esophageal cancer Neg Hx    Stomach cancer Neg Hx    Rectal cancer Neg Hx   The patient's parents  are still living, in their early 68s as of June 2017. The patient has [name] brother, 2 sisters. On the maternal side there is a history of uterine and prostate cancer. On the paternal side there is a history of colon cancer and possibly uterine cancer. There is Debbie history of breast or ovarian cancer in the family.    GYNECOLOGIC HISTORY:  Patient's last menstrual period was 12/19/2015.  Menarche age 26. She still having regular periods. She is GX P1, first pregnancy age 2. She used oral contraceptives for a few years remotely, with Debbie complications. She is currently on a Mirena IUD and also is status post bilateral tubal ligation.   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 Her husband [name] is a Administrator.  They are both taking appropriate precautions at work.  Their son [name], s a radio DJ.  The patient is expecting her first grandchild May 2023.  She is not a Ambulance person.   ADVANCED DIRECTIVES: In the  absence of any documents to the contrary her husband is her healthcare power of attorney   HEALTH MAINTENANCE: Social History   Tobacco Use   Smoking status: Never   Smokeless tobacco: Never  Vaping Use   Vaping Use: Never used  Substance Use Topics   Alcohol use: Debbie   Drug use: Debbie     Colonoscopy: January 2016   PAP: January 2016   Bone density: August 2017 showed a T score of +0.7 normal  Lipid panel:  Allergies  Allergen Reactions   Ace Inhibitors Cough   Atorvastatin Other (See Comments)    Joint pain   Simvastatin Other (See Comments)    Joint pain    Current Outpatient Medications  Medication Sig Dispense Refill   acetaminophen (TYLENOL) 500 MG tablet Take 1,000 mg by mouth every 6 (six) hours as needed.     blood glucose meter kit and supplies KIT Dispense based on patient and insurance preference. Use up to four times daily as directed. (FOR ICD-9 250.00, 250.01). 1 each 0   Calcium-Magnesium 250-125 MG TABS Take 1 tablet by mouth daily. 120 each 4   cetirizine (ZYRTEC) 10 MG tablet Take 10 mg by mouth daily as needed for allergies.      chlorhexidine (PERIDEX) 0.12 % solution Use 5 mLs in the mouth or throat 2 (two) times daily as needed as directed. Do not swallow 473 mL 6   doxycycline (VIBRA-TABS) 100 MG tablet Take 1 tablet (100 mg total) by mouth daily. 30 tablet 2   Ferrous Sulfate (IRON) 325 (65 Fe) MG TABS Take 1 tablet (325 mg total) by mouth daily. 30 each 2   furosemide (LASIX) 20 MG tablet Take 1 tablet (20 mg total) by mouth daily. 90 tablet 6   glucose blood test strip USE AS DIRECTED TO TEST UP TO FOUR TIMES DAILY (Patient taking differently: USE AS DIRECTED TO TEST UP TO FOUR TIMES DAILY) 100 strip 0   ibuprofen (ADVIL) 800 MG tablet Take 1 tablet (800 mg total) by mouth 2 (two) times daily as needed. 30 tablet 1   Lancets (ONETOUCH ULTRASOFT) lancets Use as instructed 100 each 12   levothyroxine (SYNTHROID) 150 MCG tablet Take 1 tablet (150 mcg  total) by mouth every morning on an empty stomach 90 tablet 4   levothyroxine (SYNTHROID) 175 MCG tablet Take 1 tablet (175 mcg total) by mouth in the morning on an empty stomach 30 tablet 6  lidocaine-prilocaine (EMLA) cream Apply to affected area once 30 g 3   losartan-hydrochlorothiazide (HYZAAR) 100-25 MG tablet TAKE 1 TABLET BY MOUTH ONCE DAILY 90 tablet 1   Multiple Vitamin (MULTIVITAMIN) tablet Take 1 tablet by mouth daily.     omega-3 acid ethyl esters (LOVAZA) 1 g capsule Take 1 g by mouth 2 (two) times daily.      omeprazole (PRILOSEC) 40 MG capsule Take 1 capsule (40 mg total) by mouth daily. 30 capsule 3   oxyCODONE (OXY IR/ROXICODONE) 5 MG immediate release tablet Take 1 tablet (5 mg total) by mouth every 4 (four) hours as needed for severe pain. 60 tablet 0   potassium chloride SA (KLOR-CON) 20 MEQ tablet Take 2 tablets (40 mEq total) by mouth daily. 90 tablet 4   pregabalin (LYRICA) 100 MG capsule Take 1 capsule (100 mg total) by mouth 3 (three) times daily. 90 capsule 0   prochlorperazine (COMPAZINE) 10 MG tablet Take 1 tablet (10 mg total) by mouth every 6 (six) hours as needed (Nausea or vomiting). 30 tablet 1   rosuvastatin (CRESTOR) 5 MG tablet TAKE 1 TABLET BY MOUTH ONCE DAILY 30 tablet 7   rosuvastatin (CRESTOR) 5 MG tablet Take 1 tablet (5 mg total) by mouth daily. 30 tablet 0   rosuvastatin (CRESTOR) 5 MG tablet Take 1 tablet (5 mg total) by mouth daily. 30 tablet 1   sertraline (ZOLOFT) 100 MG tablet TAKE 1 TABLET BY MOUTH ONCE A DAY 30 tablet 12   valACYclovir (VALTREX) 1000 MG tablet Take 1 tablet (1,000 mg total) by mouth daily. 60 tablet 6   Vitamin D, Cholecalciferol, 1000 units CAPS Take 1 tablet by mouth daily. (Patient taking differently: Take 1,000 Units by mouth daily.) 90 capsule 4   Debbie current facility-administered medications for this visit.   Facility-Administered Medications Ordered in Other Visits  Medication Dose Route Frequency Provider Last Rate Last  Admin   sodium chloride flush (NS) 0.9 % injection 10 mL  10 mL Intravenous PRN Tobie Hellen, Virgie Dad, MD   10 mL at 01/28/21 0756    OBJECTIVE: African-American woman who appears stated age  Vitals:   01/28/21 0809  BP: (!) 171/67  Pulse: 99  Resp: 19  Temp: (!) 97 F (36.1 C)  SpO2: 95%     Body mass index is 48.28 kg/m.    ECOG FS:1 - Symptomatic but completely ambulatory  Filed Weights   01/28/21 0809  Weight: 299 lb 1.6 oz (135.7 kg)    Sclerae unicteric, EOMs intact Wearing a mask Debbie cervical or supraclavicular adenopathy Lungs Debbie rales or rhonchi Heart regular rate and rhythm Abd soft, obese, nontender, positive bowel sounds MSK Debbie focal spinal tenderness, chronic right upper extremity lymphedema, compression sleeve in place Neuro: nonfocal, well oriented, appropriate affect Breasts: Status post bilateral mastectomies.  There is Debbie evidence of chest wall recurrence.  Both axillae are benign   LAB RESULTS:  CMP     Component Value Date/Time   NA 141 01/14/2021 1232   NA 140 07/08/2016 0948   K 3.6 01/14/2021 1232   K 4.1 07/08/2016 0948   CL 102 01/14/2021 1232   CO2 27 01/14/2021 1232   CO2 24 07/08/2016 0948   GLUCOSE 153 (H) 01/14/2021 1232   GLUCOSE 89 07/08/2016 0948   BUN 13 01/14/2021 1232   BUN 11.9 07/08/2016 0948   CREATININE 1.04 (H) 01/14/2021 1232   CREATININE 0.84 08/22/2020 1050   CREATININE 0.9 07/08/2016 0948   CALCIUM  9.5 01/14/2021 1232   CALCIUM 10.0 07/08/2016 0948   PROT 6.8 01/14/2021 1232   PROT 7.6 07/08/2016 0948   ALBUMIN 3.3 (L) 01/14/2021 1232   ALBUMIN 3.6 07/08/2016 0948   AST 14 (L) 01/14/2021 1232   AST 16 08/22/2020 1050   AST 22 07/08/2016 0948   ALT 15 01/14/2021 1232   ALT 14 08/22/2020 1050   ALT 27 07/08/2016 0948   ALKPHOS 113 01/14/2021 1232   ALKPHOS 115 07/08/2016 0948   BILITOT 0.4 01/14/2021 1232   BILITOT 0.3 08/22/2020 1050   BILITOT 0.35 07/08/2016 0948   GFRNONAA >60 01/14/2021 1232   GFRNONAA >60  08/22/2020 1050   GFRAA >60 01/30/2020 0814   GFRAA >60 05/16/2019 0905    INo results found for: SPEP, UPEP  Lab Results  Component Value Date   WBC 5.1 01/14/2021   NEUTROABS 3.9 01/14/2021   HGB 10.6 (L) 01/14/2021   HCT 33.0 (L) 01/14/2021   MCV 95.1 01/14/2021   PLT 243 01/14/2021      Chemistry      Component Value Date/Time   NA 141 01/14/2021 1232   NA 140 07/08/2016 0948   K 3.6 01/14/2021 1232   K 4.1 07/08/2016 0948   CL 102 01/14/2021 1232   CO2 27 01/14/2021 1232   CO2 24 07/08/2016 0948   BUN 13 01/14/2021 1232   BUN 11.9 07/08/2016 0948   CREATININE 1.04 (H) 01/14/2021 1232   CREATININE 0.84 08/22/2020 1050   CREATININE 0.9 07/08/2016 0948      Component Value Date/Time   CALCIUM 9.5 01/14/2021 1232   CALCIUM 10.0 07/08/2016 0948   ALKPHOS 113 01/14/2021 1232   ALKPHOS 115 07/08/2016 0948   AST 14 (L) 01/14/2021 1232   AST 16 08/22/2020 1050   AST 22 07/08/2016 0948   ALT 15 01/14/2021 1232   ALT 14 08/22/2020 1050   ALT 27 07/08/2016 0948   BILITOT 0.4 01/14/2021 1232   BILITOT 0.3 08/22/2020 1050   BILITOT 0.35 07/08/2016 0948       Debbie results found for: LABCA2  Debbie components found for: LABCA125  Debbie results for input(s): INR in the last 168 hours.  Urinalysis Debbie results found for: COLORURINE, APPEARANCEUR, LABSPEC, PHURINE, GLUCOSEU, HGBUR, BILIRUBINUR, KETONESUR, PROTEINUR, UROBILINOGEN, NITRITE, LEUKOCYTESUR   STUDIES: Debbie results found.   ELIGIBLE FOR AVAILABLE RESEARCH PROTOCOL: PALLAS, but inelegible because of Mirena IUD   ASSESSMENT: 55 y.o. [place] woman status post right breast upper inner quadrant biopsy 10/15/2015 for a clinical T2-T3 N0, stage II invasive ductal carcinoma, estrogen and progesterone receptor positive, HER-2 nonamplified, with an Mib-1 of 70%  (1) status post right lumpectomy and sentinel lymph node sampling with oncoplastic breast reduction 11/28/2015 for a pT2 pN1, stage IIB invasive ductal  carcinoma, grade 1, with negative margins (1/1 sentinel node positive)  (a) reclassified as stage IIA in the 2018 prognostic revision   (2) left reduction mammoplasty 11/28/2015 unexpectedly found a pT1c pNX invasive ductal carcinoma, grade 1, estrogen and progesterone receptor positive, HER-2 not amplified, with an MIB-1 of 3%  (3) Mammaprint from the right sided breast cancer was "low risk", predicting a 98% chance of Debbie disease recurrence within 5 years with anti-estrogens as the only systemic therapy. It also predicts minimal to Debbie benefit from chemotherapy  (4) status post bilateral mastectomies with bilateral sentinel lymph node sampling 12/26/2015, showing  (a) on the right, Debbie residual carcinoma (0/3 nodes involved)  (b) on the left, focal ductal carcinoma  in situ, 0.2 cm, with negative margins; (0/4 nodes involved)  (5) adjuvant radiation 02/11/16 - 03/27/16    1) Right chest wall: 50.4 Gy in 28 fractions               2) Right chest wall boost: 10 Gy in 5 fractions   (6) started on tamoxifen neoadjuvantly 10/23/2015 to allow for expected delays in definitive local treatment  (a) Mirena IUD in place  (b) tamoxifen discontinued 08/13/2017, with progression  (7) since she had 4 lymph nodes removed from each axilla but also receive radiation on the right, lab draws should preferentially be obtained from the left arm   METASTATIC DISEASE: April 2019 (8) CT scan of the abdomen and pelvis 08/04/2017, MRI of the liver and total spine MRI 08/11/2017 showed liver and bone metastases  (a) liver biopsy 09/07/2017 confirms estrogen receptor positive, progesterone receptor negative, HER-2 negative metastatic breast cancer  (b) chest CT scan 08/13/2017 shows multiple bilateral pulmonary nodules.  (c) CT of the brain with and without contrast 08/26/2017 shows Debbie evidence of metastatic disease to the brain  (d) CA 27-29 is informative  (9) fulvestrant started 08/17/2017  (a) palbociclib started  08/31/2017  (b) fulvestrant and palbociclib discontinued 01/04/2018 with evidence of progression in the liver  (10) denosumab/Xgeva started 09/14/2017  (a) held after 03/01/2018 dose because concern re. osteonecrosis  (b) resumed 06/09/2018  (c) 02/21/2019 dose held due to oral surgery, resumed 03/21/2019  (11) anastrozole started 01/04/2018  (a) everolimus started 01/29/2018  (b) liver MRI and bone scan December 2019 show stable disease  (c) chest CT scan on 08/02/2018 shows lung lesions resolved, bone lesions stable  (d) bone scan 09/13/2018 essentially stable  (e) liver MRI 12/08/2018 shows improvement in hepatic metastsases  (f) liver MRI and bone scan 04/13/2019 showed continuing improvement in the liver, stable bone lesions  (g) chest CT 08/01/2019 stable, Debbie evidence of active disease ows  (12) 01/28/18 prophylactic left femur intramedullary nail surgery for impending left femur pathologic fracture  (a) palliative radiation with Dr. [name] to the left femur and left ilium from 02/16/2018 to 03/01/2018  (13) [facility] report on liver biopsy from April 2019 shows a negative PDL 1, stable MSI and proficient mismatch repair status.  The tumor is positive for the androgen receptor, and for PTEN for PIK3 CA mutations.  HER-2/neu was read as equivocal (it was negative by FISH on the pathology report).  (14) Progression noted in liver MRI on 01/11/2020; fulvestrant started on 01/30/2020  (a) alpelisib started 01/30/2020 at 300 mg daily with Metformin 500 mg daily  (b) alpelisib held on 02/07/2020 with very high blood sugars.  Metformin increased to 1 g daily  (c) alpelisib resumed at 150 mg daily on 02/13/2020  (d) liver MRI 05/06/2020 shows evidence of disease progression  (e) fulvestrant and alpelisib discontinued January 2022  (15) capecitabine started 05/22/2020 at 1.5 g twice daily, 14/7  (a) abdominal MRI 08/23/2020 shows worsening liver disease  (b) chest CT scan 09/04/2020 shows new lung  metastases  (c) capecitabine discontinued April 2022  (16) paclitaxel started 09/10/2020, repeated days 1, 8, and 15 of each 28-day cycle  (A) changed to every 2 weeks beginning with cycle 3 (11/05/2020)   PLAN: Teriyah is tolerating the paclitaxel generally well and we are proceeding with treatment today.  Her tumor marker appears to be stabilizing.  I think this is a good time to repeat her staging studies.  He will have an MRI of  the abdomen and a CT of the chest before she returns to see me in 2 weeks.  I congratulated her on the upcoming grandchild next May.  Please that she is doing well with her physical therapy and water therapy.  We will have to continue to watch the peripheral neuropathy very closely.  Total encounter time 25 minutes.Sarajane Jews C. Tailynn Armetta, MD 01/28/21 8:10 AM Medical Oncology and Hematology [facility] [place], [place] [address] Tel. [phone]    Fax. [phone]   I, Wilburn Mylar, am acting as scribe for Dr. Virgie Dad. Ericca Labra.  I, Lurline Del MD, have reviewed the above documentation for accuracy and completeness, and I agree with the above.   *Total Encounter Time as defined by the Centers for Medicare and Medicaid Services includes, in addition to the face-to-face time of a patient visit (documented in the note above) non-face-to-face time: obtaining and reviewing outside history, ordering and reviewing medications, tests or procedures, care coordination (communications with other health care professionals or caregivers) and documentation in the medical record.

## 2021-01-28 ENCOUNTER — Inpatient Hospital Stay: Payer: 59

## 2021-01-28 ENCOUNTER — Other Ambulatory Visit: Payer: Self-pay

## 2021-01-28 ENCOUNTER — Encounter: Payer: Self-pay | Admitting: Physical Therapy

## 2021-01-28 ENCOUNTER — Other Ambulatory Visit (HOSPITAL_COMMUNITY): Payer: Self-pay

## 2021-01-28 ENCOUNTER — Inpatient Hospital Stay (HOSPITAL_BASED_OUTPATIENT_CLINIC_OR_DEPARTMENT_OTHER): Payer: 59 | Admitting: Oncology

## 2021-01-28 ENCOUNTER — Ambulatory Visit: Payer: 59 | Admitting: Physical Therapy

## 2021-01-28 VITALS — BP 171/67 | HR 99 | Temp 97.0°F | Resp 19 | Ht 66.0 in | Wt 299.1 lb

## 2021-01-28 DIAGNOSIS — C7800 Secondary malignant neoplasm of unspecified lung: Secondary | ICD-10-CM | POA: Diagnosis not present

## 2021-01-28 DIAGNOSIS — M6281 Muscle weakness (generalized): Secondary | ICD-10-CM

## 2021-01-28 DIAGNOSIS — M545 Low back pain, unspecified: Secondary | ICD-10-CM

## 2021-01-28 DIAGNOSIS — Z170 Estrogen receptor positive status [ER+]: Secondary | ICD-10-CM

## 2021-01-28 DIAGNOSIS — C787 Secondary malignant neoplasm of liver and intrahepatic bile duct: Secondary | ICD-10-CM | POA: Diagnosis not present

## 2021-01-28 DIAGNOSIS — C50211 Malignant neoplasm of upper-inner quadrant of right female breast: Secondary | ICD-10-CM | POA: Diagnosis not present

## 2021-01-28 DIAGNOSIS — Z95828 Presence of other vascular implants and grafts: Secondary | ICD-10-CM

## 2021-01-28 DIAGNOSIS — Z923 Personal history of irradiation: Secondary | ICD-10-CM | POA: Diagnosis not present

## 2021-01-28 DIAGNOSIS — Z5111 Encounter for antineoplastic chemotherapy: Secondary | ICD-10-CM | POA: Diagnosis not present

## 2021-01-28 DIAGNOSIS — G629 Polyneuropathy, unspecified: Secondary | ICD-10-CM | POA: Diagnosis not present

## 2021-01-28 DIAGNOSIS — R293 Abnormal posture: Secondary | ICD-10-CM

## 2021-01-28 DIAGNOSIS — G8929 Other chronic pain: Secondary | ICD-10-CM

## 2021-01-28 DIAGNOSIS — C50919 Malignant neoplasm of unspecified site of unspecified female breast: Secondary | ICD-10-CM

## 2021-01-28 DIAGNOSIS — C50112 Malignant neoplasm of central portion of left female breast: Secondary | ICD-10-CM

## 2021-01-28 DIAGNOSIS — C7951 Secondary malignant neoplasm of bone: Secondary | ICD-10-CM | POA: Diagnosis not present

## 2021-01-28 DIAGNOSIS — Z9013 Acquired absence of bilateral breasts and nipples: Secondary | ICD-10-CM | POA: Diagnosis not present

## 2021-01-28 LAB — CBC WITH DIFFERENTIAL/PLATELET
Abs Immature Granulocytes: 0.03 10*3/uL (ref 0.00–0.07)
Basophils Absolute: 0 10*3/uL (ref 0.0–0.1)
Basophils Relative: 1 %
Eosinophils Absolute: 0.1 10*3/uL (ref 0.0–0.5)
Eosinophils Relative: 3 %
HCT: 31.5 % — ABNORMAL LOW (ref 36.0–46.0)
Hemoglobin: 10.3 g/dL — ABNORMAL LOW (ref 12.0–15.0)
Immature Granulocytes: 1 %
Lymphocytes Relative: 15 %
Lymphs Abs: 0.7 10*3/uL (ref 0.7–4.0)
MCH: 30.3 pg (ref 26.0–34.0)
MCHC: 32.7 g/dL (ref 30.0–36.0)
MCV: 92.6 fL (ref 80.0–100.0)
Monocytes Absolute: 0.4 10*3/uL (ref 0.1–1.0)
Monocytes Relative: 9 %
Neutro Abs: 3.5 10*3/uL (ref 1.7–7.7)
Neutrophils Relative %: 71 %
Platelets: 270 10*3/uL (ref 150–400)
RBC: 3.4 MIL/uL — ABNORMAL LOW (ref 3.87–5.11)
RDW: 17 % — ABNORMAL HIGH (ref 11.5–15.5)
WBC: 4.8 10*3/uL (ref 4.0–10.5)
nRBC: 0 % (ref 0.0–0.2)

## 2021-01-28 LAB — COMPREHENSIVE METABOLIC PANEL
ALT: 15 U/L (ref 0–44)
AST: 12 U/L — ABNORMAL LOW (ref 15–41)
Albumin: 3.2 g/dL — ABNORMAL LOW (ref 3.5–5.0)
Alkaline Phosphatase: 127 U/L — ABNORMAL HIGH (ref 38–126)
Anion gap: 10 (ref 5–15)
BUN: 13 mg/dL (ref 6–20)
CO2: 29 mmol/L (ref 22–32)
Calcium: 9.2 mg/dL (ref 8.9–10.3)
Chloride: 102 mmol/L (ref 98–111)
Creatinine, Ser: 1.02 mg/dL — ABNORMAL HIGH (ref 0.44–1.00)
GFR, Estimated: >60 mL/min (ref >60)
Glucose, Bld: 114 mg/dL — ABNORMAL HIGH (ref 70–99)
Potassium: 3.5 mmol/L (ref 3.5–5.1)
Sodium: 141 mmol/L (ref 135–145)
Total Bilirubin: 0.3 mg/dL (ref 0.3–1.2)
Total Protein: 6.6 g/dL (ref 6.5–8.1)

## 2021-01-28 MED ORDER — HEPARIN SOD (PORK) LOCK FLUSH 100 UNIT/ML IV SOLN: 500.0000 [IU] | Freq: Once | INTRAVENOUS | Status: DC | PRN

## 2021-01-28 MED ORDER — SODIUM CHLORIDE 0.9 % IV SOLN: Freq: Once | INTRAVENOUS | Status: AC

## 2021-01-28 MED ORDER — SODIUM CHLORIDE 0.9 % IV SOLN
80.0000 mg/m2 | Freq: Once | INTRAVENOUS | Status: AC
  Administered 2021-01-28: 192 mg via INTRAVENOUS
  Filled 2021-01-28: qty 32

## 2021-01-28 MED ORDER — FAMOTIDINE 20 MG IN NS 100 ML IVPB
20.0000 mg | Freq: Once | INTRAVENOUS | Status: AC
  Administered 2021-01-28: 20 mg via INTRAVENOUS
  Filled 2021-01-28: qty 100

## 2021-01-28 MED ORDER — SODIUM CHLORIDE 0.9% FLUSH: 10.0000 mL | INTRAVENOUS | Status: DC | PRN

## 2021-01-28 MED ORDER — DIPHENHYDRAMINE HCL 50 MG/ML IJ SOLN
25.0000 mg | Freq: Once | INTRAMUSCULAR | Status: AC
  Administered 2021-01-28: 25 mg via INTRAVENOUS
  Filled 2021-01-28: qty 1

## 2021-01-28 MED ORDER — SODIUM CHLORIDE 0.9% FLUSH
10.0000 mL | INTRAVENOUS | Status: DC | PRN
  Administered 2021-01-28: 10 mL via INTRAVENOUS

## 2021-01-28 MED ORDER — ROSUVASTATIN CALCIUM 5 MG PO TABS
5.0000 mg | ORAL_TABLET | Freq: Every day | ORAL | 1 refills | Status: DC
  Filled 2021-01-28 – 2021-03-25 (×2): qty 30, 30d supply, fill #0
  Filled 2021-04-28: qty 30, 30d supply, fill #1

## 2021-01-28 MED ORDER — DEXAMETHASONE SODIUM PHOSPHATE 100 MG/10ML IJ SOLN
10.0000 mg | Freq: Once | INTRAMUSCULAR | Status: AC
  Administered 2021-01-28: 10 mg via INTRAVENOUS
  Filled 2021-01-28: qty 10

## 2021-01-28 NOTE — Therapy (Signed)
[place], [place], [address] Phone: [phone]   Fax:  [phone]  Physical Therapy Treatment  Patient Details  Name: Debbie [name] MRN: [id number] Date of Birth: Sep 09, 1965 Referring Provider (PT): Dr. [name]   Encounter Date: 01/28/2021   PT End of Session - 01/28/21 1451     Visit Number 5    Number of Visits 12    Date for PT Re-Evaluation 02/18/21    PT Start Time 2426    PT Stop Time 8341    PT Time Calculation (min) 40 min    Activity Tolerance Patient tolerated treatment well    Behavior During Therapy [facility] for tasks assessed/performed             Past Medical History:  Diagnosis Date   Anxiety    Arthritis    Bone metastasis ([place])    Breast cancer of upper-inner quadrant of right female breast [facility]) oncologist-  dr [name]--- 04/ 2019 ER/PR+ Stage IV w/ metastatic disease to liver, total spine, lung, bone   dx 06/ 2017  right breast invasive ductal carcinoma, Stage IIB, ER/PR + (pT2 pN1), grade 1-- s/p  right breast lumpecotmy w/ sln bx's and bilateral breast reduction 11-28-2015/  left breast with reduction , dx invasive ductal ca, Grade 1 (pT1cpNX) ER/PR positive/  adjuvant radiation completed 03-27-2016 to right chest wall, 2018  reclassifiec Stage IIA   Cancer, metastatic to liver ([place])    Chronic back pain    Constipation    Depression    Family history of adverse reaction to anesthesia     mother has Copd was kept on vent    Headache    Heart murmur    History of hyperthyroidism    s/p  RAI   History of radiation therapy 02/11/16- 03/27/16   Right Chest Wall 50.4 Gy in 28 fractions, Right Chest Wall Boost 10 Gy in 5 fractions.    Hyperlipidemia    Hypertension    Hypothyroidism, postradioiodine therapy    endocrinoloigst-  dr balan--  2004  s/p  RAI   Menorrhagia 2011   Multiple pulmonary nodules    secondary  metastatic   PONV (postoperative nausea and  vomiting)    patch helps    SUI (stress urinary incontinence, female)    Wears contact lenses    Wears dentures    full upper and lower partial    Past Surgical History:  Procedure Laterality Date   BREAST LUMPECTOMY WITH NEEDLE LOCALIZATION AND AXILLARY SENTINEL LYMPH NODE BX Right 11/28/2015   Procedure: RIGHT BREAST LUMPECTOMY WITH NEEDLE LOCALIZATION AND AXILLARY SENTINEL LYMPH NODE BX;  Surgeon: Autumn Messing III, MD;  Location: [place];  Service: General;  Laterality: Right;   BREAST REDUCTION SURGERY Bilateral 11/28/2015   Procedure: MAMMARY REDUCTION  (BREAST) BILATERAL;  Surgeon: Crissie Reese, MD;  Location: [place];  Service: Plastics;  Laterality: Bilateral;   CESAREAN SECTION  01/12/1993   COLONOSCOPY     Dental surgeries     FEMUR IM NAIL Left 01/28/2018   Procedure: INTRAMEDULLARY (IM) NAIL FEMORAL;  Surgeon: Nicholes Stairs, MD;  Location: [place];  Service: Orthopedics;  Laterality: Left;   HYSTEROSCOPY N/A 09/02/2017   Procedure: HYSTEROSCOPY  to remove IUD;  Surgeon: Eldred Manges, MD;  Location: [place];  Service: Gynecology;  Laterality: N/A;   IR IMAGING GUIDED PORT INSERTION  09/16/2020   Liver biospy     MASTECTOMY  MASTECTOMY W/ SENTINEL NODE BIOPSY Bilateral 12/26/2015   Procedure: BILATERAL MASTECTOMY WITH LEFT SENTINEL LYMPH NODE BIOPSY;  Surgeon: Autumn Messing III, MD;  Location: [place];  Service: General;  Laterality: Bilateral;   Removal of IUD     TUBAL LIGATION Bilateral 1995    There were no vitals filed for this visit.   Subjective Assessment - 01/28/21 1406     Subjective My back is doing better. I can reach behind me now. I can walk a little bit longer now. It still hurts but nothing like it was.    Pertinent History Stage IV breast Cancer s/p Bilateral Mastectomies with METS to Liver, spine, lung, Bone.  Has left  Femur IM rod.  Presently on Paclitaxel Day 1 and 15 of 28 day cycle.  Stable Right UE lymphedema. 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 Yes    Pain Score 3     Pain Location Back    Pain Orientation Left    Pain Descriptors / Indicators Heaviness    Pain Type Chronic pain    Pain Onset More than a month ago    Pain Frequency Intermittent    Aggravating Factors  moving    Pain Relieving Factors sitting    Effect of Pain on Daily Activities no effect                       Flowsheet Row Outpatient [facility]  Lymphedema Life Impact Scale Total Score 11.76 %             [facility] Adult PT Treatment/Exercise - 01/28/21 0001       Lumbar Exercises: Stretches   Other Lumbar Stretch Exercise educated pt in Meeks decompression exercises and had her hold all exercises for 5 sec and do 5 reps      Lumbar Exercises: Supine   Ab Set 10 reps;5 seconds    AB Set Limitations v/c to coordinate with diaphramatic breathing and on how to get proper core contraction    Clam 15 reps   with core activation   Bent Knee Raise 10 reps    Bent Knee Raise Limitations with TrA small lift of 4" and 3 sec holds    Basic Lumbar Stabilization 10 reps    Basic Lumbar Stabilization Limitations TrA activation with overhead dowel press and relaxation with each rep      Knee/Hip Exercises: Standing   Hip Flexion Stengthening;Both;1 set;10 reps   pt returned therapist demo. v/c to engage core   Hip Flexion Limitations HHA on counter    Hip ADduction Strengthening;Both;1 set;10 reps   pt returned therapist demo, v/c to engage core   Hip ADduction Limitations HHA on counter    Hip Extension Stengthening;Both;1 set;10 reps   pt returned therapist demo, v/c to engage core   Extension Limitations HHA on counter                          PT Long Term Goals - 01/07/21 [place] #1   Title Pt will be independent  with HEP for lumbar, core and generalized strength    Time 6    Period Weeks    Status New    Target Date 02/18/21  PT LONG TERM GOAL #2   Title Pt will have LBP decreased by atleast 50%    Time 6    Period Weeks    Status New    Target Date 02/18/21      PT LONG TERM GOAL #3   Title pt will be able to shower without sitting on shower chair    Time 6    Period Weeks    Status New    Target Date 02/18/21      PT LONG TERM GOAL #4   Title pt will be able to wash dishes for 10-20 minutes with minimal complaints of LBP    Time 6    Period Weeks    Target Date 02/18/21      PT LONG TERM GOAL #5   Title pt will be able to rise from chair without pain or stiffness    Time 6    Period Weeks    Status New    Target Date 02/18/21                   Plan - 01/28/21 1452     Clinical Impression Statement Pt reports she has been feeling better and having less pain since starting therapy. Re educated pt on Meeks decompression exercises as pt has not attempted these at home yet. Continued with instruction in pelvic tilts and diaphragmatic breathing. Pt required less cueing today to perform pelvic tilts correctly but still needed occasionaly cueing to keep pt from pushing abdomen outwards instead of drawing inwards. Pt was going to go to [place] after session today to see if she is able to make it to the shoe section before needing a rest break which she has needed in the past.    PT Frequency 2x / week    PT Duration 4 weeks    PT Treatment/Interventions Aquatic Therapy;ADLs/Self Care Home Management;Therapeutic exercise;Neuromuscular re-education;Manual techniques;Patient/family education;Therapeutic activities    PT Next Visit Plan (*Bone METS: lumbar, thoracic spine, sternum, ribs, Rt ischium) cont low level spinal stab to start, LE flexibility, body mechanics    Consulted and Agree with Plan of Care Patient             Patient will benefit from skilled therapeutic  intervention in order to improve the following deficits and impairments:  Difficulty walking, Pain, Decreased knowledge of precautions, Decreased activity tolerance, Postural dysfunction, Decreased strength, Decreased mobility  Visit Diagnosis: Chronic bilateral low back pain without sciatica  Abnormal posture  Muscle weakness (generalized)  Primary malignant neoplasm of breast with metastasis to other site, unspecified laterality [facility])     Problem List Patient Active Problem List   Diagnosis Date Noted   Encounter for antineoplastic chemotherapy 10/08/2020   Lower extremity edema 10/08/2020   Lung metastases ([place]) 09/10/2020   Goals of care, counseling/discussion 12/27/2018   Osteonecrosis ([place]) 11/01/2018   Pathological fracture in neoplastic disease, left femur, initial encounter for fracture ([place]) 01/28/2018   Morbid obesity with BMI of 40.0-44.9, adult ([place]) 97/06/6376   IUD complication ([place]) 58/85/0277   Bone metastases ([place]) 08/13/2017   Pain from bone metastases ([place]) 08/13/2017   Liver metastases ([place]) 08/13/2017   Steatosis of liver 08/13/2017   Bilateral malignant neoplasm of breast in female, estrogen receptor positive ([place]) 12/26/2015   Malignant neoplasm of central portion of left breast in female, estrogen receptor positive ([place]) 11/28/2015   Malignant neoplasm of upper-inner quadrant of right breast in female, estrogen receptor positive ([place]) 10/17/2015  Hypertension 04/19/2012   Hypothyroid 04/19/2012   Fibroids 04/19/2012   Stress incontinence, female 04/19/2012    Manus Gunning, PT 01/28/2021, 2:54 PM  [place], [place], [address] Phone: [phone]   Fax:  [phone]  Name: Debbie [name] MRN: [id number] Date of Birth: [birth year]  Manus Gunning, PT 01/28/21 2:54 PM

## 2021-01-28 NOTE — Patient Instructions (Signed)
[facility]   Discharge Instructions: Thank you for choosing [facility] to provide your oncology and hematology care.   If you have a lab appointment with the Cancer Center, please go directly to the Cancer Center and check in at the registration area.   Wear comfortable clothing and clothing appropriate for easy access to any Portacath or PICC line.   We strive to give you quality time with your provider. You may need to reschedule your appointment if you arrive late (15 or more minutes).  Arriving late affects you and other patients whose appointments are after yours.  Also, if you miss three or more appointments without notifying the office, you may be dismissed from the clinic at the provider's discretion.      For prescription refill requests, have your pharmacy contact our office and allow 72 hours for refills to be completed.    Today you received the following chemotherapy and/or immunotherapy agents: paclitaxel.      To help prevent nausea and vomiting after your treatment, we encourage you to take your nausea medication as directed.  BELOW ARE SYMPTOMS THAT SHOULD BE REPORTED IMMEDIATELY: *FEVER GREATER THAN 100.4 F (38 C) OR HIGHER *CHILLS OR SWEATING *NAUSEA AND VOMITING THAT IS NOT CONTROLLED WITH YOUR NAUSEA MEDICATION *UNUSUAL SHORTNESS OF BREATH *UNUSUAL BRUISING OR BLEEDING *URINARY PROBLEMS (pain or burning when urinating, or frequent urination) *BOWEL PROBLEMS (unusual diarrhea, constipation, pain near the anus) TENDERNESS IN MOUTH AND THROAT WITH OR WITHOUT PRESENCE OF ULCERS (sore throat, sores in mouth, or a toothache) UNUSUAL RASH, SWELLING OR PAIN  UNUSUAL VAGINAL DISCHARGE OR ITCHING   Items with * indicate a potential emergency and should be followed up as soon as possible or go to the Emergency Department if any problems should occur.  Please show the CHEMOTHERAPY ALERT CARD or IMMUNOTHERAPY ALERT CARD at check-in  to the Emergency Department and triage nurse.  Should you have questions after your visit or need to cancel or reschedule your appointment, please contact [facility]  Dept: [phone]  and follow the prompts.  Office hours are 8:00 a.m. to 4:30 p.m. Monday - Friday. Please note that voicemails left after 4:00 p.m. may not be returned until the following business day.  We are closed weekends and major holidays. You have access to a nurse at all times for urgent questions. Please call the main number to the clinic Dept: [phone] and follow the prompts.   For any non-urgent questions, you may also contact your provider using MyChart. We now offer e-Visits for anyone 18 and older to request care online for non-urgent symptoms. For details visit [email or web address].   Also download the MyChart app! Go to the app store, search "MyChart", open the app, select [facility], and log in with your MyChart username and password.  Due to Covid, a mask is required upon entering the hospital/clinic. If you do not have a mask, one will be given to you upon arrival. For doctor visits, patients may have 1 support person aged 18 or older with them. For treatment visits, patients cannot have anyone with them due to current Covid guidelines and our immunocompromised population.   

## 2021-01-29 LAB — CANCER ANTIGEN 27.29: CA 27.29: 142.6 U/mL — ABNORMAL HIGH (ref 0.0–38.6)

## 2021-01-30 ENCOUNTER — Ambulatory Visit (HOSPITAL_BASED_OUTPATIENT_CLINIC_OR_DEPARTMENT_OTHER): Payer: 59 | Admitting: Physical Therapy

## 2021-01-30 ENCOUNTER — Other Ambulatory Visit: Payer: Self-pay

## 2021-01-30 ENCOUNTER — Encounter (HOSPITAL_BASED_OUTPATIENT_CLINIC_OR_DEPARTMENT_OTHER): Payer: Self-pay | Admitting: Physical Therapy

## 2021-01-30 DIAGNOSIS — C50919 Malignant neoplasm of unspecified site of unspecified female breast: Secondary | ICD-10-CM

## 2021-01-30 DIAGNOSIS — Z170 Estrogen receptor positive status [ER+]: Secondary | ICD-10-CM | POA: Diagnosis not present

## 2021-01-30 DIAGNOSIS — C7951 Secondary malignant neoplasm of bone: Secondary | ICD-10-CM | POA: Diagnosis not present

## 2021-01-30 DIAGNOSIS — M6281 Muscle weakness (generalized): Secondary | ICD-10-CM

## 2021-01-30 DIAGNOSIS — R293 Abnormal posture: Secondary | ICD-10-CM

## 2021-01-30 DIAGNOSIS — C7800 Secondary malignant neoplasm of unspecified lung: Secondary | ICD-10-CM | POA: Diagnosis not present

## 2021-01-30 DIAGNOSIS — C787 Secondary malignant neoplasm of liver and intrahepatic bile duct: Secondary | ICD-10-CM | POA: Diagnosis not present

## 2021-01-30 DIAGNOSIS — Z9013 Acquired absence of bilateral breasts and nipples: Secondary | ICD-10-CM | POA: Diagnosis not present

## 2021-01-30 DIAGNOSIS — C50211 Malignant neoplasm of upper-inner quadrant of right female breast: Secondary | ICD-10-CM | POA: Diagnosis not present

## 2021-01-30 DIAGNOSIS — M545 Low back pain, unspecified: Secondary | ICD-10-CM

## 2021-01-30 DIAGNOSIS — Z5111 Encounter for antineoplastic chemotherapy: Secondary | ICD-10-CM | POA: Diagnosis not present

## 2021-01-30 DIAGNOSIS — G8929 Other chronic pain: Secondary | ICD-10-CM

## 2021-01-30 DIAGNOSIS — G629 Polyneuropathy, unspecified: Secondary | ICD-10-CM | POA: Diagnosis not present

## 2021-01-30 DIAGNOSIS — Z923 Personal history of irradiation: Secondary | ICD-10-CM | POA: Diagnosis not present

## 2021-01-30 NOTE — Therapy (Signed)
[place] [address] [place], [place], [address] Phone: [phone]   Fax:  [phone]  Physical Therapy Treatment  Patient Details  Name: Debbie [name] MRN: [id number] Date of Birth: Jul 17, 1965 Referring Provider (PT): Dr. [name]   Encounter Date: 01/30/2021   PT End of Session - 01/30/21 0951     Visit Number 6    Number of Visits 12    Date for PT Re-Evaluation 02/18/21    PT Start Time 0950    PT Stop Time 1030    PT Time Calculation (min) 40 min    Activity Tolerance Patient tolerated treatment well    Behavior During Therapy [facility] for tasks assessed/performed             Past Medical History:  Diagnosis Date   Anxiety    Arthritis    Bone metastasis ([place])    Breast cancer of upper-inner quadrant of right female breast [facility]) oncologist-  dr [name]--- 04/ 2019 ER/PR+ Stage IV w/ metastatic disease to liver, total spine, lung, bone   dx 06/ 2017  right breast invasive ductal carcinoma, Stage IIB, ER/PR + (pT2 pN1), grade 1-- s/p  right breast lumpecotmy w/ sln bx's and bilateral breast reduction 11-28-2015/  left breast with reduction , dx invasive ductal ca, Grade 1 (pT1cpNX) ER/PR positive/  adjuvant radiation completed 03-27-2016 to right chest wall, 2018  reclassifiec Stage IIA   Cancer, metastatic to liver ([place])    Chronic back pain    Constipation    Depression    Family history of adverse reaction to anesthesia     mother has Copd was kept on vent    Headache    Heart murmur    History of hyperthyroidism    s/p  RAI   History of radiation therapy 02/11/16- 03/27/16   Right Chest Wall 50.4 Gy in 28 fractions, Right Chest Wall Boost 10 Gy in 5 fractions.    Hyperlipidemia    Hypertension    Hypothyroidism, postradioiodine therapy    endocrinoloigst-  dr balan--  2004  s/p  RAI   Menorrhagia 2011   Multiple pulmonary nodules    secondary  metastatic   PONV (postoperative nausea and  vomiting)    patch helps    SUI (stress urinary incontinence, female)    Wears contact lenses    Wears dentures    full upper and lower partial    Past Surgical History:  Procedure Laterality Date   BREAST LUMPECTOMY WITH NEEDLE LOCALIZATION AND AXILLARY SENTINEL LYMPH NODE BX Right 11/28/2015   Procedure: RIGHT BREAST LUMPECTOMY WITH NEEDLE LOCALIZATION AND AXILLARY SENTINEL LYMPH NODE BX;  Surgeon: Autumn Messing III, MD;  Location: [place];  Service: General;  Laterality: Right;   BREAST REDUCTION SURGERY Bilateral 11/28/2015   Procedure: MAMMARY REDUCTION  (BREAST) BILATERAL;  Surgeon: Crissie Reese, MD;  Location: [place];  Service: Plastics;  Laterality: Bilateral;   CESAREAN SECTION  01/12/1993   COLONOSCOPY     Dental surgeries     FEMUR IM NAIL Left 01/28/2018   Procedure: INTRAMEDULLARY (IM) NAIL FEMORAL;  Surgeon: Nicholes Stairs, MD;  Location: [place];  Service: Orthopedics;  Laterality: Left;   HYSTEROSCOPY N/A 09/02/2017   Procedure: HYSTEROSCOPY  to remove IUD;  Surgeon: Eldred Manges, MD;  Location: [place];  Service: Gynecology;  Laterality: N/A;   IR IMAGING GUIDED PORT INSERTION  09/16/2020   Liver biospy     MASTECTOMY  MASTECTOMY W/ SENTINEL NODE BIOPSY Bilateral 12/26/2015   Procedure: BILATERAL MASTECTOMY WITH LEFT SENTINEL LYMPH NODE BIOPSY;  Surgeon: Autumn Messing III, MD;  Location: [place];  Service: General;  Laterality: Bilateral;   Removal of IUD     TUBAL LIGATION Bilateral 1995    There were no vitals filed for this visit.   Subjective Assessment - 01/30/21 0955     Subjective "Much better, can twist and bend with less pain.  Exhausted after last session although I saw on land PT and then aquatics PT on same day"    Pain Score 2     Pain Location Back                       Flowsheet Row Outpatient Rehab from 12/08/2018 in [place]  Lymphedema Life Impact Scale Total Score 11.76 %                            PT Long Term Goals - 01/07/21 1352       PT LONG TERM GOAL #1   Title Pt will be independent with HEP for lumbar, core and generalized strength    Time 6    Period Weeks    Status New    Target Date 02/18/21      PT LONG TERM GOAL #2   Title Pt will have LBP decreased by atleast 50%    Time 6    Period Weeks    Status New    Target Date 02/18/21      PT LONG TERM GOAL #3   Title pt will be able to shower without sitting on shower chair    Time 6    Period Weeks    Status New    Target Date 02/18/21      PT LONG TERM GOAL #4   Title pt will be able to wash dishes for 10-20 minutes with minimal complaints of LBP    Time 6    Period Weeks    Target Date 02/18/21      PT LONG TERM GOAL #5   Title pt will be able to rise from chair without pain or stiffness    Time 6    Period Weeks    Status New    Target Date 02/18/21           Pt seen for aquatic therapy today.  Treatment took place in water 3.25-4.8 ft in depth at the [employer] pool. Temp of water was 95.  Pt entered/exited the pool via stairs step to pattern independently with bilat rail.   Warm up: forward, backward and side stepping/walking cues for increased step length, increased speed, hand placement to increase resistance. 6 widths    Seated Stretching: gastroc, adductors and hamstrings 5 x 20 second hold   Standing Strengthening and balance training:  -High knee marching/jogging 3 x 15 reps.  Pulse max 104 O2 sats 95% supported by yellow noodle -adduction/abd holding to wall 2 x 15, 1 x10 - hip ext 3x10 -knee flex x 10 -Kick board push down 2 x 15 reps added manual perturbation (by PT) -kick board lateral rotations R/L x 10 All exercises pt cues to increase speed for added arobic capacity challenges and greater resistance.     Water walking warm down VC for increased step length. X 6 widths, pt instructed on long steps/lunges and spped.  O2  sat  and pulse monitored throughout       Pt requires buoyancy for support and to offload joints with strengthening exercises. Viscosity of the water is needed for resistance of strengthening; water current perturbations provides challenge to standing balance unsupported, requiring increased core activation.        Plan - 01/30/21 1007     Clinical Impression Statement Focus on strengthening and aerobic capacity increasing speed of all exercises.  Monitored HR and O2 sats througohout. Max HR 116 O2 sats remain 95-96% . Pt requiring several recovery period throughout, no C/o pain. Added intensity to exercises which pt rises to without diffiucly but with increased SOB also advanced core ex. Pt edu on aerobic capacity training and benefits. Pt puts forth excellent effort.             Patient will benefit from skilled therapeutic intervention in order to improve the following deficits and impairments:     Visit Diagnosis: Chronic bilateral low back pain without sciatica  Abnormal posture  Muscle weakness (generalized)  Primary malignant neoplasm of breast with metastasis to other site, unspecified laterality [facility])     Problem List Patient Active Problem List   Diagnosis Date Noted   Encounter for antineoplastic chemotherapy 10/08/2020   Lower extremity edema 10/08/2020   Lung metastases ([place]) 09/10/2020   Goals of care, counseling/discussion 12/27/2018   Osteonecrosis ([place]) 11/01/2018   Pathological fracture in neoplastic disease, left femur, initial encounter for fracture ([place]) 01/28/2018   Morbid obesity with BMI of 40.0-44.9, adult ([place]) 99/35/7017   IUD complication ([place]) 79/39/0300   Bone metastases ([place]) 08/13/2017   Pain from bone metastases ([place]) 08/13/2017   Liver metastases ([place]) 08/13/2017   Steatosis of liver 08/13/2017   Bilateral malignant neoplasm of breast in female, estrogen receptor positive ([place]) 12/26/2015   Malignant neoplasm of central portion of left  breast in female, estrogen receptor positive ([place]) 11/28/2015   Malignant neoplasm of upper-inner quadrant of right breast in female, estrogen receptor positive ([place]) 10/17/2015   Hypertension 04/19/2012   Hypothyroid 04/19/2012   Fibroids 04/19/2012   Stress incontinence, female 04/19/2012    Annamarie Major) Meribeth Vitug MPT  01/30/2021, 1:53 PM  [facility] [address] [place], [place], [address] Phone: [phone]   Fax:  [phone]  Name: Debbie [name] MRN: [id number] Date of Birth: 1965-12-23

## 2021-02-03 ENCOUNTER — Other Ambulatory Visit: Payer: Self-pay | Admitting: Oncology

## 2021-02-03 ENCOUNTER — Other Ambulatory Visit (HOSPITAL_COMMUNITY): Payer: Self-pay

## 2021-02-03 MED ORDER — DOXYCYCLINE HYCLATE 100 MG PO TABS
100.0000 mg | ORAL_TABLET | Freq: Every day | ORAL | 2 refills | Status: DC
  Filled 2021-02-03: qty 30, 30d supply, fill #0
  Filled 2021-02-27: qty 30, 30d supply, fill #1
  Filled 2021-04-08: qty 30, 30d supply, fill #2

## 2021-02-03 MED FILL — Sertraline HCl Tab 100 MG: ORAL | 30 days supply | Qty: 30 | Fill #5 | Status: AC

## 2021-02-04 ENCOUNTER — Other Ambulatory Visit: Payer: Self-pay

## 2021-02-04 ENCOUNTER — Encounter: Payer: Self-pay | Admitting: Rehabilitation

## 2021-02-04 ENCOUNTER — Ambulatory Visit: Payer: 59 | Admitting: Rehabilitation

## 2021-02-04 ENCOUNTER — Ambulatory Visit (HOSPITAL_BASED_OUTPATIENT_CLINIC_OR_DEPARTMENT_OTHER): Payer: 59 | Admitting: Physical Therapy

## 2021-02-04 DIAGNOSIS — Z5111 Encounter for antineoplastic chemotherapy: Secondary | ICD-10-CM | POA: Diagnosis not present

## 2021-02-04 DIAGNOSIS — G629 Polyneuropathy, unspecified: Secondary | ICD-10-CM | POA: Diagnosis not present

## 2021-02-04 DIAGNOSIS — Z923 Personal history of irradiation: Secondary | ICD-10-CM | POA: Diagnosis not present

## 2021-02-04 DIAGNOSIS — C7951 Secondary malignant neoplasm of bone: Secondary | ICD-10-CM | POA: Diagnosis not present

## 2021-02-04 DIAGNOSIS — Z170 Estrogen receptor positive status [ER+]: Secondary | ICD-10-CM | POA: Diagnosis not present

## 2021-02-04 DIAGNOSIS — M545 Low back pain, unspecified: Secondary | ICD-10-CM

## 2021-02-04 DIAGNOSIS — C50211 Malignant neoplasm of upper-inner quadrant of right female breast: Secondary | ICD-10-CM | POA: Diagnosis not present

## 2021-02-04 DIAGNOSIS — Z9013 Acquired absence of bilateral breasts and nipples: Secondary | ICD-10-CM | POA: Diagnosis not present

## 2021-02-04 DIAGNOSIS — M6281 Muscle weakness (generalized): Secondary | ICD-10-CM

## 2021-02-04 DIAGNOSIS — C7800 Secondary malignant neoplasm of unspecified lung: Secondary | ICD-10-CM | POA: Diagnosis not present

## 2021-02-04 DIAGNOSIS — R293 Abnormal posture: Secondary | ICD-10-CM

## 2021-02-04 DIAGNOSIS — C787 Secondary malignant neoplasm of liver and intrahepatic bile duct: Secondary | ICD-10-CM | POA: Diagnosis not present

## 2021-02-04 DIAGNOSIS — G8929 Other chronic pain: Secondary | ICD-10-CM

## 2021-02-04 NOTE — Therapy (Signed)
[place], [place], [address] Phone: [phone]   Fax:  [phone]  Physical Therapy Treatment  Patient Details  Name: Debbie [name] MRN: [id number] Date of Birth: 1965/07/16 Referring Provider (PT): Dr. [name]   Encounter Date: 02/04/2021   PT End of Session - 02/04/21 1336     Visit Number 7    Number of Visits 12    Date for PT Re-Evaluation 02/18/21    PT Start Time 1300    PT Stop Time 0354    PT Time Calculation (min) 34 min    Activity Tolerance Patient tolerated treatment well    Behavior During Therapy [facility] for tasks assessed/performed             Past Medical History:  Diagnosis Date   Anxiety    Arthritis    Bone metastasis ([place])    Breast cancer of upper-inner quadrant of right female breast [facility]) oncologist-  dr [name]--- 04/ 2019 ER/PR+ Stage IV w/ metastatic disease to liver, total spine, lung, bone   dx 06/ 2017  right breast invasive ductal carcinoma, Stage IIB, ER/PR + (pT2 pN1), grade 1-- s/p  right breast lumpecotmy w/ sln bx's and bilateral breast reduction 11-28-2015/  left breast with reduction , dx invasive ductal ca, Grade 1 (pT1cpNX) ER/PR positive/  adjuvant radiation completed 03-27-2016 to right chest wall, 2018  reclassifiec Stage IIA   Cancer, metastatic to liver ([place])    Chronic back pain    Constipation    Depression    Family history of adverse reaction to anesthesia     mother has Copd was kept on vent    Headache    Heart murmur    History of hyperthyroidism    s/p  RAI   History of radiation therapy 02/11/16- 03/27/16   Right Chest Wall 50.4 Gy in 28 fractions, Right Chest Wall Boost 10 Gy in 5 fractions.    Hyperlipidemia    Hypertension    Hypothyroidism, postradioiodine therapy    endocrinoloigst-  dr balan--  2004  s/p  RAI   Menorrhagia 2011   Multiple pulmonary nodules    secondary  metastatic   PONV (postoperative nausea and  vomiting)    patch helps    SUI (stress urinary incontinence, female)    Wears contact lenses    Wears dentures    full upper and lower partial    Past Surgical History:  Procedure Laterality Date   BREAST LUMPECTOMY WITH NEEDLE LOCALIZATION AND AXILLARY SENTINEL LYMPH NODE BX Right 11/28/2015   Procedure: RIGHT BREAST LUMPECTOMY WITH NEEDLE LOCALIZATION AND AXILLARY SENTINEL LYMPH NODE BX;  Surgeon: Autumn Messing III, MD;  Location: [place];  Service: General;  Laterality: Right;   BREAST REDUCTION SURGERY Bilateral 11/28/2015   Procedure: MAMMARY REDUCTION  (BREAST) BILATERAL;  Surgeon: Crissie Reese, MD;  Location: [place];  Service: Plastics;  Laterality: Bilateral;   CESAREAN SECTION  01/12/1993   COLONOSCOPY     Dental surgeries     FEMUR IM NAIL Left 01/28/2018   Procedure: INTRAMEDULLARY (IM) NAIL FEMORAL;  Surgeon: Nicholes Stairs, MD;  Location: [place];  Service: Orthopedics;  Laterality: Left;   HYSTEROSCOPY N/A 09/02/2017   Procedure: HYSTEROSCOPY  to remove IUD;  Surgeon: Eldred Manges, MD;  Location: [place];  Service: Gynecology;  Laterality: N/A;   IR IMAGING GUIDED PORT INSERTION  09/16/2020   Liver biospy     MASTECTOMY  MASTECTOMY W/ SENTINEL NODE BIOPSY Bilateral 12/26/2015   Procedure: BILATERAL MASTECTOMY WITH LEFT SENTINEL LYMPH NODE BIOPSY;  Surgeon: Autumn Messing III, MD;  Location: [place];  Service: General;  Laterality: Bilateral;   Removal of IUD     TUBAL LIGATION Bilateral 1995    There were no vitals filed for this visit.   Subjective Assessment - 02/04/21 1258     Subjective My son got married this past weekend.  I am sore now. I did alot of dancing and wore some heels    Pertinent History Stage IV breast Cancer s/p Bilateral Mastectomies with METS to Liver, spine, lung, Bone.  Has left  Femur IM rod.  Presently on Paclitaxel Day 1 and 15 of 28 day cycle.  Stable Right UE lymphedema. 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 Yes    Pain Score 7     Pain Location Back    Pain Orientation Mid;Lower    Pain Descriptors / Indicators Heaviness    Pain Type Chronic pain    Pain Onset More than a month ago    Pain Frequency Intermittent                       Flowsheet Row Outpatient Rehab from 12/08/2018 in [place]  Lymphedema Life Impact Scale Total Score 11.76 %             [facility] Adult PT Treatment/Exercise - 02/04/21 0001       Lumbar Exercises: Seated   Long Arc Quad on Chair Both;10 reps    LAQ on Chair Limitations seated on wobble cushion      Lumbar Exercises: Supine   AB Set Limitations 7" x 7    Clam 10 reps    Clam Limitations both with slow out/in    Bent Knee Raise 10 reps    Bent Knee Raise Limitations with TrA small lift of 4" and 3 sec holds    Bridge with [employer] 10 reps    Bridge with [employer] Limitations no bridge 2" hold      Knee/Hip Exercises: Standing   Hip Flexion Stengthening;Both;1 set;10 reps    Hip Flexion Limitations HHA on counter    Hip Abduction Both;10 reps    Abduction Limitations HHA on plinth    Hip Extension Stengthening;Both;1 set;10 reps    Extension Limitations HHA on counter                          PT Long Term Goals - 01/07/21 1352       PT LONG TERM GOAL #1   Title Pt will be independent with HEP for lumbar, core and generalized strength    Time 6    Period Weeks    Status New    Target Date 02/18/21      PT LONG TERM GOAL #2   Title Pt will have LBP decreased by atleast 50%    Time 6    Period Weeks    Status New    Target Date 02/18/21      PT LONG TERM GOAL #3   Title pt will be able to shower without sitting on shower chair    Time 6    Period Weeks    Status New    Target Date 02/18/21      PT LONG TERM GOAL #4  Title pt will be able to wash dishes for 10-20 minutes with minimal complaints of LBP    Time 6    Period Weeks    Target Date  02/18/21      PT LONG TERM GOAL #5   Title pt will be able to rise from chair without pain or stiffness    Time 6    Period Weeks    Status New    Target Date 02/18/21                   Plan - 02/04/21 1336     Clinical Impression Statement Pt with higher levels of pain today after having fun at her son's wedding tis past weekend.  Pt was still able to tolerate all core strengthening without increased pain.  Noted some increased size of abdomen today which pt says is probably from edema/liver size.    PT Frequency 2x / week    PT Duration 4 weeks    PT Treatment/Interventions Aquatic Therapy;ADLs/Self Care Home Management;Therapeutic exercise;Neuromuscular re-education;Manual techniques;Patient/family education;Therapeutic activities    PT Next Visit Plan (*Bone METS: lumbar, thoracic spine, sternum, ribs, Rt ischium) cont low level spinal stab to start, LE flexibility, body mechanics    Consulted and Agree with Plan of Care Patient             Patient will benefit from skilled therapeutic intervention in order to improve the following deficits and impairments:     Visit Diagnosis: Chronic bilateral low back pain without sciatica  Abnormal posture  Muscle weakness (generalized)     Problem List Patient Active Problem List   Diagnosis Date Noted   Encounter for antineoplastic chemotherapy 10/08/2020   Lower extremity edema 10/08/2020   Lung metastases ([place]) 09/10/2020   Goals of care, counseling/discussion 12/27/2018   Osteonecrosis ([place]) 11/01/2018   Pathological fracture in neoplastic disease, left femur, initial encounter for fracture ([place]) 01/28/2018   Morbid obesity with BMI of 40.0-44.9, adult ([place]) 44/62/8638   IUD complication ([place]) 17/71/1657   Bone metastases ([place]) 08/13/2017   Pain from bone metastases ([place]) 08/13/2017   Liver metastases ([place]) 08/13/2017   Steatosis of liver 08/13/2017   Bilateral malignant neoplasm of breast in female, estrogen  receptor positive ([place]) 12/26/2015   Malignant neoplasm of central portion of left breast in female, estrogen receptor positive ([place]) 11/28/2015   Malignant neoplasm of upper-inner quadrant of right breast in female, estrogen receptor positive ([place]) 10/17/2015   Hypertension 04/19/2012   Hypothyroid 04/19/2012   Fibroids 04/19/2012   Stress incontinence, female 04/19/2012    Stark Bray, PT 02/04/2021, 1:38 PM  [place], [place], [address] Phone: [phone]   Fax:  [phone]  Name: Debbie [name] MRN: [id number] Date of Birth: 1965/10/28

## 2021-02-06 ENCOUNTER — Encounter: Payer: 59 | Admitting: Rehabilitation

## 2021-02-07 ENCOUNTER — Ambulatory Visit (HOSPITAL_COMMUNITY)
Admission: RE | Admit: 2021-02-07 | Discharge: 2021-02-07 | Disposition: A | Discharge status: Home / Self Care | Payer: 59 | Source: Ambulatory Visit | Attending: Oncology | Admitting: Oncology

## 2021-02-07 ENCOUNTER — Other Ambulatory Visit: Payer: Self-pay

## 2021-02-07 DIAGNOSIS — N281 Cyst of kidney, acquired: Secondary | ICD-10-CM | POA: Diagnosis not present

## 2021-02-07 DIAGNOSIS — C50211 Malignant neoplasm of upper-inner quadrant of right female breast: Secondary | ICD-10-CM

## 2021-02-07 DIAGNOSIS — K769 Liver disease, unspecified: Secondary | ICD-10-CM | POA: Diagnosis not present

## 2021-02-07 DIAGNOSIS — R918 Other nonspecific abnormal finding of lung field: Secondary | ICD-10-CM | POA: Diagnosis not present

## 2021-02-07 DIAGNOSIS — C50112 Malignant neoplasm of central portion of left female breast: Secondary | ICD-10-CM | POA: Insufficient documentation

## 2021-02-07 DIAGNOSIS — Z170 Estrogen receptor positive status [ER+]: Secondary | ICD-10-CM

## 2021-02-07 DIAGNOSIS — R911 Solitary pulmonary nodule: Secondary | ICD-10-CM | POA: Diagnosis not present

## 2021-02-07 DIAGNOSIS — C7800 Secondary malignant neoplasm of unspecified lung: Secondary | ICD-10-CM | POA: Diagnosis not present

## 2021-02-07 DIAGNOSIS — I700 Atherosclerosis of aorta: Secondary | ICD-10-CM | POA: Diagnosis not present

## 2021-02-07 DIAGNOSIS — C50919 Malignant neoplasm of unspecified site of unspecified female breast: Secondary | ICD-10-CM | POA: Diagnosis not present

## 2021-02-07 DIAGNOSIS — C787 Secondary malignant neoplasm of liver and intrahepatic bile duct: Secondary | ICD-10-CM | POA: Diagnosis not present

## 2021-02-07 MED ORDER — HEPARIN SOD (PORK) LOCK FLUSH 100 UNIT/ML IV SOLN
500.0000 [IU] | Freq: Once | INTRAVENOUS | Status: AC
  Administered 2021-02-07: 500 [IU] via INTRAVENOUS

## 2021-02-07 MED ORDER — IOHEXOL 350 MG/ML SOLN
75.0000 mL | Freq: Once | INTRAVENOUS | Status: AC | PRN
  Administered 2021-02-07: 75 mL via INTRAVENOUS

## 2021-02-07 MED ORDER — GADOBUTROL 1 MMOL/ML IV SOLN
10.0000 mL | Freq: Once | INTRAVENOUS | Status: AC | PRN
  Administered 2021-02-07: 10 mL via INTRAVENOUS

## 2021-02-10 ENCOUNTER — Encounter: Payer: Self-pay | Admitting: Oncology

## 2021-02-10 MED FILL — Dexamethasone Sodium Phosphate Inj 100 MG/10ML: INTRAMUSCULAR | Qty: 1 | Status: AC

## 2021-02-10 NOTE — Progress Notes (Signed)
[place]  [phone] [phone]    ID: [id number] KEADY DOB: 1965/07/17  MR#: [id number]  XIP#:[id number]  Patient Care Team: Kelton Pillar, MD as PCP - General (Family Medicine) Jacelyn Pi, MD as Consulting Physician (Endocrinology) Jovita Kussmaul, MD as Consulting Physician (General Surgery) Obrian Bulson, Virgie Dad, MD as Consulting Physician (Oncology) [name], MD as Attending Physician (Radiation Oncology) Belva Crome, MD as Consulting Physician (Cardiology) Crissie Reese, MD as Consulting Physician (Plastic Surgery) [name], [name], NP as Nurse Practitioner (Hematology and Oncology) Lajoyce Lauber, DMD (Dentistry) Raina Mina, RPH-CPP (Pharmacist) OTHER MD:   CHIEF COMPLAINT: Estrogen receptor positive stage IV  breast cancer (s/p bilateral mastectomies)  CURRENT TREATMENT: paclitaxel; denosumab/Xgeva   INTERVAL HISTORY: Debbie [name] returns today for follow up and treatment of her stage IV breast cancer.  Since her last visit, she underwent restaging chest CT on 02/07/2021 showing: slight increased conspicuity of small pulmonary nodules in lungs, possibility of metastatic disease no excluded; persistent right hilar and subcarinal lymphadenopathy, concerning for metastatic disease; widespread metastatic disease to bones, similar to prior study in 11/2020.  She also underwent abdomen MRI the same day showing: significant decrease in hepatic metastatic disease; no significant change in bone metastases; no new or progressive metastatic disease.  She was started on paclitaxel on 09/10/2020, initially 3 weeks on 1 week off.. She now receives treatments days 1 and 15 of each 28-day cycle.  She is tolerating this generally well.  There has been no progression in her peripheral neuropathy which somehow centers on the third toe of the left foot.  She has no neuropathy in her hands.  She continues on Xgeva every 4 weeks, with  her most recent dose on 01/14/2021.  She has no side effects from this that she is aware of  We are following her CA 27-29: Lab Results  Component Value Date   CA2729 142.6 (H) 01/28/2021   CA2729 115.1 (H) 01/14/2021   CA2729 100.9 (H) 12/31/2020   CA2729 110.3 (H) 12/17/2020   CA2729 97.0 (H) 12/03/2020    REVIEW OF SYSTEMS: Debbie [name] continues to tolerate treatment generally well.  She says her neuropathy "comes and goes", and centers on her left foot and particularly her second toe and the left foot.  She has some stiffness in her hands sometimes but no real neuropathic problem.  Her son got married about 10 days ago and [place] or some high heels which did not help.  She also did some dancing!  She feels tired more on the off week than on the treatment week.  She continues to work full-time.  A detailed review of systems was otherwise stable   COVID 19 VACCINATION STATUS: Status post Pfizer x2, with booster January 2021   BREAST CANCER HISTORY: From the original intake note:  Debbie [name] had screening mammography with tomosynthesis at the [facility] 10/02/2015. This showed 2 possible masses in the right breast as well as some suspicious calcifications. The left breast was unremarkable. On 10/15/2015 she underwent right diagnostic mammography with tomography and right breast ultrasonography. The breast density was category B. Spot magnification views confirmed an area of pleomorphic calcifications measuring up to 5 cm in the upper half of the breast. Also in the upper outer right breast there were adjacent low-density masses without distortion or calcifications. On physical exam there was no palpable abnormality.   Targeted ultrasound of the right breast found the 2 "masses" in the right upper quadrant were benign cysts  measuring 0.8 and 0.5 cm respectively. Biopsy of the area of calcification on 10/15/2015 showed ([id number]) invasive ductal carcinoma, E-cadherin positive, grade 2, estrogen  receptor 90% positive, progesterone receptor 50% positive, both with strong staining intensity, with an MIB-1 of 70%, and no HER-2 amplification, the signals ratio being 1.18 and the number per cell 2.60.   Her subsequent history is as detailed below    PAST MEDICAL HISTORY: Past Medical History:  Diagnosis Date   Anxiety    Arthritis    Bone metastasis ([place])    Breast cancer of upper-inner quadrant of right female breast [facility]) oncologist-  dr [name]--- 04/ 2019 ER/PR+ Stage IV w/ metastatic disease to liver, total spine, lung, bone   dx 06/ 2017  right breast invasive ductal carcinoma, Stage IIB, ER/PR + (pT2 pN1), grade 1-- s/p  right breast lumpecotmy w/ sln bx's and bilateral breast reduction 11-28-2015/  left breast with reduction , dx invasive ductal ca, Grade 1 (pT1cpNX) ER/PR positive/  adjuvant radiation completed 03-27-2016 to right chest wall, 2018  reclassifiec Stage IIA   Cancer, metastatic to liver ([place])    Chronic back pain    Constipation    Depression    Family history of adverse reaction to anesthesia     mother has Copd was kept on vent    Headache    Heart murmur    History of hyperthyroidism    s/p  RAI   History of radiation therapy 02/11/16- 03/27/16   Right Chest Wall 50.4 Gy in 28 fractions, Right Chest Wall Boost 10 Gy in 5 fractions.    Hyperlipidemia    Hypertension    Hypothyroidism, postradioiodine therapy    endocrinoloigst-  dr balan--  2004  s/p  RAI   Menorrhagia 2011   Multiple pulmonary nodules    secondary  metastatic   PONV (postoperative nausea and vomiting)    patch helps    SUI (stress urinary incontinence, female)    Wears contact lenses    Wears dentures    full upper and lower partial    PAST SURGICAL HISTORY: Past Surgical History:  Procedure Laterality Date   BREAST LUMPECTOMY WITH NEEDLE LOCALIZATION AND AXILLARY SENTINEL LYMPH NODE BX Right 11/28/2015   Procedure: RIGHT BREAST LUMPECTOMY WITH NEEDLE LOCALIZATION AND AXILLARY  SENTINEL LYMPH NODE BX;  Surgeon: Autumn Messing III, MD;  Location: [place];  Service: General;  Laterality: Right;   BREAST REDUCTION SURGERY Bilateral 11/28/2015   Procedure: MAMMARY REDUCTION  (BREAST) BILATERAL;  Surgeon: Crissie Reese, MD;  Location: [place];  Service: Plastics;  Laterality: Bilateral;   CESAREAN SECTION  01/12/1993   COLONOSCOPY     Dental surgeries     FEMUR IM NAIL Left 01/28/2018   Procedure: INTRAMEDULLARY (IM) NAIL FEMORAL;  Surgeon: Nicholes Stairs, MD;  Location: [place];  Service: Orthopedics;  Laterality: Left;   HYSTEROSCOPY N/A 09/02/2017   Procedure: HYSTEROSCOPY  to remove IUD;  Surgeon: Eldred Manges, MD;  Location: [place];  Service: Gynecology;  Laterality: N/A;   IR IMAGING GUIDED PORT INSERTION  09/16/2020   Liver biospy     MASTECTOMY     MASTECTOMY W/ SENTINEL NODE BIOPSY Bilateral 12/26/2015   Procedure: BILATERAL MASTECTOMY WITH LEFT SENTINEL LYMPH NODE BIOPSY;  Surgeon: Autumn Messing III, MD;  Location: [place];  Service: General;  Laterality: Bilateral;   Removal of IUD     TUBAL LIGATION Bilateral 1995    FAMILY HISTORY: Family History  Problem Relation Age of Onset   Diabetes Mother    Hypertension Mother    Hypertension Father    Diabetes Maternal Grandmother    Hypertension Sister    Cancer Paternal Grandmother        colon   Colon cancer Paternal Grandmother    Esophageal cancer Neg Hx    Stomach cancer Neg Hx    Rectal cancer Neg Hx   The patient's parents are still living, in their early 54s as of June 2017. The patient has one brother, 2 sisters. On the maternal side there is a history of uterine and prostate cancer. On the paternal side there is a history of colon cancer and possibly uterine cancer. There is no history of breast or ovarian cancer in the family.    GYNECOLOGIC HISTORY:  Patient's last menstrual period was 12/19/2015.  Menarche age 39. She still having regular periods. She is GX P1, first pregnancy age  64. She used oral contraceptives for a few years remotely, with no complications. She is currently on a Mirena IUD and also is status post bilateral tubal ligation.   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 Her husband [name] is a Administrator.  They are both taking appropriate precautions at work.  Their son [name], s a radio DJ.  The patient is expecting her first grandchild May 2023.  She is not a Ambulance person.   ADVANCED DIRECTIVES: In the absence of any documents to the contrary her husband is her healthcare power of attorney   HEALTH MAINTENANCE: Social History   Tobacco Use   Smoking status: Never   Smokeless tobacco: Never  Vaping Use   Vaping Use: Never used  Substance Use Topics   Alcohol use: No   Drug use: No     Colonoscopy: January 2016   PAP: January 2016   Bone density: August 2017 showed a T score of +0.7 normal  Lipid panel:  Allergies  Allergen Reactions   Ace Inhibitors Cough   Atorvastatin Other (See Comments)    Joint pain   Simvastatin Other (See Comments)    Joint pain    Current Outpatient Medications  Medication Sig Dispense Refill   acetaminophen (TYLENOL) 500 MG tablet Take 1,000 mg by mouth every 6 (six) hours as needed.     Calcium-Magnesium 250-125 MG TABS Take 1 tablet by mouth daily. 120 each 4   cetirizine (ZYRTEC) 10 MG tablet Take 10 mg by mouth daily as needed for allergies.      chlorhexidine (PERIDEX) 0.12 % solution Use 5 mLs in the mouth or throat 2 (two) times daily as needed as directed. Do not swallow 473 mL 6   doxycycline (VIBRA-TABS) 100 MG tablet Take 1 tablet (100 mg total) by mouth daily. 30 tablet 2   furosemide (LASIX) 20 MG tablet Take 1 tablet (20 mg total) by mouth daily. 90 tablet 6   levothyroxine (SYNTHROID) 175 MCG tablet Take 1 tablet (175 mcg total) by mouth in the morning on an empty stomach 30 tablet 6   lidocaine-prilocaine (EMLA) cream Apply to  affected area once 30 g 3   losartan-hydrochlorothiazide (HYZAAR) 100-25 MG tablet TAKE 1 TABLET BY MOUTH ONCE DAILY 90 tablet 1   Multiple Vitamin (MULTIVITAMIN) tablet Take 1 tablet by mouth daily.     omega-3 acid ethyl esters (LOVAZA) 1 g capsule Take 1 g by mouth 2 (two) times daily.      omeprazole (  PRILOSEC) 40 MG capsule Take 1 capsule (40 mg total) by mouth daily. 30 capsule 3   oxyCODONE (OXY IR/ROXICODONE) 5 MG immediate release tablet Take 1 tablet (5 mg total) by mouth every 4 (four) hours as needed for severe pain. 60 tablet 0   potassium chloride SA (KLOR-CON) 20 MEQ tablet Take 2 tablets (40 mEq total) by mouth daily. 90 tablet 4   pregabalin (LYRICA) 100 MG capsule Take 1 capsule (100 mg total) by mouth 3 (three) times daily. 90 capsule 0   prochlorperazine (COMPAZINE) 10 MG tablet Take 1 tablet (10 mg total) by mouth every 6 (six) hours as needed (Nausea or vomiting). 30 tablet 1   rosuvastatin (CRESTOR) 5 MG tablet Take 1 tablet (5 mg total) by mouth daily. 30 tablet 1   sertraline (ZOLOFT) 100 MG tablet TAKE 1 TABLET BY MOUTH ONCE A DAY 30 tablet 12   valACYclovir (VALTREX) 1000 MG tablet Take 1 tablet (1,000 mg total) by mouth daily. 60 tablet 6   Vitamin D, Cholecalciferol, 1000 units CAPS Take 1 tablet by mouth daily. (Patient taking differently: Take 1,000 Units by mouth daily.) 90 capsule 4   No current facility-administered medications for this visit.    OBJECTIVE: African-American woman in no acute distress  Vitals:   02/11/21 0817  BP: (!) 160/73  Pulse: 95  Temp: (!) 97.5 F (36.4 C)  SpO2: 96%     Body mass index is 48.16 kg/m.    ECOG FS:1 - Symptomatic but completely ambulatory  Filed Weights   02/11/21 0817  Weight: 298 lb 6.4 oz (135.4 kg)    Sclerae unicteric, EOMs intact Wearing a mask No cervical or supraclavicular adenopathy Lungs no rales or rhonchi Heart regular rate and rhythm Abd soft, obese, nontender, positive bowel sounds MSK no focal  spinal tenderness, no upper extremity lymphedema Neuro: nonfocal, well oriented, appropriate affect Breasts: Status post bilateral mastectomies.  There is no evidence of chest wall recurrence.  Both axillae are benign.   LAB RESULTS:  CMP     Component Value Date/Time   NA 141 01/28/2021 0756   NA 140 07/08/2016 0948   K 3.5 01/28/2021 0756   K 4.1 07/08/2016 0948   CL 102 01/28/2021 0756   CO2 29 01/28/2021 0756   CO2 24 07/08/2016 0948   GLUCOSE 114 (H) 01/28/2021 0756   GLUCOSE 89 07/08/2016 0948   BUN 13 01/28/2021 0756   BUN 11.9 07/08/2016 0948   CREATININE 1.02 (H) 01/28/2021 0756   CREATININE 0.84 08/22/2020 1050   CREATININE 0.9 07/08/2016 0948   CALCIUM 9.2 01/28/2021 0756   CALCIUM 10.0 07/08/2016 0948   PROT 6.6 01/28/2021 0756   PROT 7.6 07/08/2016 0948   ALBUMIN 3.2 (L) 01/28/2021 0756   ALBUMIN 3.6 07/08/2016 0948   AST 12 (L) 01/28/2021 0756   AST 16 08/22/2020 1050   AST 22 07/08/2016 0948   ALT 15 01/28/2021 0756   ALT 14 08/22/2020 1050   ALT 27 07/08/2016 0948   ALKPHOS 127 (H) 01/28/2021 0756   ALKPHOS 115 07/08/2016 0948   BILITOT 0.3 01/28/2021 0756   BILITOT 0.3 08/22/2020 1050   BILITOT 0.35 07/08/2016 0948   GFRNONAA >60 01/28/2021 0756   GFRNONAA >60 08/22/2020 1050   GFRAA >60 01/30/2020 0814   GFRAA >60 05/16/2019 0905    INo results found for: SPEP, UPEP  Lab Results  Component Value Date   WBC 4.8 02/11/2021   NEUTROABS 3.5 02/11/2021   HGB 10.7 (  L) 02/11/2021   HCT 32.9 (L) 02/11/2021   MCV 91.9 02/11/2021   PLT 260 02/11/2021      Chemistry      Component Value Date/Time   NA 141 01/28/2021 0756   NA 140 07/08/2016 0948   K 3.5 01/28/2021 0756   K 4.1 07/08/2016 0948   CL 102 01/28/2021 0756   CO2 29 01/28/2021 0756   CO2 24 07/08/2016 0948   BUN 13 01/28/2021 0756   BUN 11.9 07/08/2016 0948   CREATININE 1.02 (H) 01/28/2021 0756   CREATININE 0.84 08/22/2020 1050   CREATININE 0.9 07/08/2016 0948      Component  Value Date/Time   CALCIUM 9.2 01/28/2021 0756   CALCIUM 10.0 07/08/2016 0948   ALKPHOS 127 (H) 01/28/2021 0756   ALKPHOS 115 07/08/2016 0948   AST 12 (L) 01/28/2021 0756   AST 16 08/22/2020 1050   AST 22 07/08/2016 0948   ALT 15 01/28/2021 0756   ALT 14 08/22/2020 1050   ALT 27 07/08/2016 0948   BILITOT 0.3 01/28/2021 0756   BILITOT 0.3 08/22/2020 1050   BILITOT 0.35 07/08/2016 0948       No results found for: LABCA2  No components found for: LABCA125  No results for input(s): INR in the last 168 hours.  Urinalysis No results found for: COLORURINE, APPEARANCEUR, LABSPEC, PHURINE, GLUCOSEU, HGBUR, BILIRUBINUR, KETONESUR, PROTEINUR, UROBILINOGEN, NITRITE, LEUKOCYTESUR   STUDIES: CT Chest W Contrast  Result Date: 02/10/2021 CLINICAL DATA:  55 year old female with history of breast cancer. Evaluate for treatment response. EXAM: CT CHEST WITH CONTRAST TECHNIQUE: Multidetector CT imaging of the chest was performed during intravenous contrast administration. CONTRAST:  99m OMNIPAQUE IOHEXOL 350 MG/ML SOLN COMPARISON:  Chest CT 11/26/2020. FINDINGS: Cardiovascular: Heart size is normal. There is no significant pericardial fluid, thickening or pericardial calcification. Atherosclerotic calcifications in the distal descending thoracic aorta. No definite coronary artery calcifications. Right internal jugular single-lumen porta cath with tip terminating at the superior cavoatrial junction. Mediastinum/Nodes: Enlarged subcarinal lymph node measuring 1.9 cm in short axis (axial image 66 of series 3) and enlarged right hilar lymph node (axial image 69 of series 3) measuring 1.1 cm in short axis. No other pathologically enlarged mediastinal, left hilar or internal mammary lymph nodes. No axillary lymphadenopathy. Lungs/Pleura: Chronic areas of ground-glass attenuation and septal thickening in the apex of the right upper lobe deep to the right axilla, compatible with chronic postradiation changes.  Mild subpleural reticulation is also noted in the anterior aspect of the right upper and middle lobes deep to the right breast, also compatible with postradiation changes. Several small pulmonary nodules are noted in the lungs bilaterally, some of which are slightly more apparent than the prior examination. The best examples of this are two 4 mm pulmonary nodules in the right lower lobe (both on axial image 93 of series 6) which previously measured only 1-2 mm on prior study 11/26/2020. No acute consolidative airspace disease. No pleural effusions. Upper Abdomen: Unremarkable. Musculoskeletal: Status post bilateral modified radical mastectomy and right axillary lymph node dissection. Diffuse mixed lytic and sclerotic lesions are noted throughout all aspects of the visualized axial and appendicular skeleton, compatible with widespread metastatic disease to the bones, very similar to the prior study. IMPRESSION: 1. Slight increased conspicuity of small pulmonary nodules in the lungs. The possibility of metastatic disease is not excluded, and close attention on follow-up studies is recommended. 2. Persistent right hilar and subcarinal lymphadenopathy, concerning for metastatic disease. 3. Widespread metastatic disease to the  bones, similar to the prior study. 4. Aortic atherosclerosis. Aortic Atherosclerosis (ICD10-I70.0). Electronically Signed   By: Vinnie Langton M.D.   On: 02/10/2021 09:21   MR Abdomen W Wo Contrast  Result Date: 02/09/2021 CLINICAL DATA:  Follow-up metastatic breast carcinoma. EXAM: MRI ABDOMEN WITHOUT AND WITH CONTRAST TECHNIQUE: Multiplanar multisequence MR imaging of the abdomen was performed both before and after the administration of intravenous contrast. CONTRAST:  76m GADAVIST GADOBUTROL 1 MMOL/ML IV SOLN COMPARISON:  08/23/2020 FINDINGS: Lower chest: No acute findings. Hepatobiliary: Diffuse hepatic metastases show significant decrease in size compared to prior study. Index lesion in  the caudate lobe currently measures 1.5 x 1.5 cm on image 14/6, compared to 2.8 x 2.7 cm previously. Another index lesion in the inferior right hepatic lobe measures 1.8 x 1.5 cm on image 26/6, compared to 2.3 x 2.2 cm previously. Gallbladder is unremarkable. No evidence of biliary ductal dilatation. Pancreas:  No mass or inflammatory changes. Spleen:  Within normal limits in size and appearance. Adrenals/Urinary Tract: No adrenal or renal masses identified. Small left renal sinus cysts remains stable. No evidence of hydronephrosis. Stomach/Bowel: Visualized portion unremarkable. Vascular/Lymphatic: No pathologically enlarged lymph nodes identified. No acute vascular findings. Other:  None. Musculoskeletal: Diffuse bone metastases throughout the visualized portion spine show no significant change. IMPRESSION: Significant decrease in hepatic metastatic disease since prior study. No significant change in bone metastases. No new or progressive metastatic disease identified within the abdomen. Electronically Signed   By: JMarlaine HindM.D.   On: 02/09/2021 22:23     ELIGIBLE FOR AVAILABLE RESEARCH PROTOCOL: PALLAS, but inelegible because of Mirena IUD   ASSESSMENT: 55y.o. [place] woman status post right breast upper inner quadrant biopsy 10/15/2015 for a clinical T2-T3 N0, stage II invasive ductal carcinoma, estrogen and progesterone receptor positive, HER-2 nonamplified, with an Mib-1 of 70%  (1) status post right lumpectomy and sentinel lymph node sampling with oncoplastic breast reduction 11/28/2015 for a pT2 pN1, stage IIB invasive ductal carcinoma, grade 1, with negative margins (1/1 sentinel node positive)  (a) reclassified as stage IIA in the 2018 prognostic revision   (2) left reduction mammoplasty 11/28/2015 unexpectedly found a pT1c pNX invasive ductal carcinoma, grade 1, estrogen and progesterone receptor positive, HER-2 not amplified, with an MIB-1 of 3%  (3) Mammaprint from the right sided  breast cancer was "low risk", predicting a 98% chance of no disease recurrence within 5 years with anti-estrogens as the only systemic therapy. It also predicts minimal to no benefit from chemotherapy  (4) status post bilateral mastectomies with bilateral sentinel lymph node sampling 12/26/2015, showing  (a) on the right, no residual carcinoma (0/3 nodes involved)  (b) on the left, focal ductal carcinoma in situ, 0.2 cm, with negative margins; (0/4 nodes involved)  (5) adjuvant radiation 02/11/16 - 03/27/16    1) Right chest wall: 50.4 Gy in 28 fractions               2) Right chest wall boost: 10 Gy in 5 fractions   (6) started on tamoxifen neoadjuvantly 10/23/2015 to allow for expected delays in definitive local treatment  (a) Mirena IUD in place  (b) tamoxifen discontinued 08/13/2017, with progression  (7) since she had 4 lymph nodes removed from each axilla but also receive radiation on the right, lab draws should preferentially be obtained from the left arm   METASTATIC DISEASE: April 2019 (8) CT scan of the abdomen and pelvis 08/04/2017, MRI of the liver and total spine MRI  08/11/2017 showed liver and bone metastases  (a) liver biopsy 09/07/2017 confirms estrogen receptor positive, progesterone receptor negative, HER-2 negative metastatic breast cancer  (b) chest CT scan 08/13/2017 shows multiple bilateral pulmonary nodules.  (c) CT of the brain with and without contrast 08/26/2017 shows no evidence of metastatic disease to the brain  (d) CA 27-29 is informative  (9) fulvestrant started 08/17/2017  (a) palbociclib started 08/31/2017  (b) fulvestrant and palbociclib discontinued 01/04/2018 with evidence of progression in the liver  (10) denosumab/Xgeva started 09/14/2017  (a) held after 03/01/2018 dose because concern re. osteonecrosis  (b) resumed 06/09/2018  (c) 02/21/2019 dose held due to oral surgery, resumed 03/21/2019  (11) anastrozole started 01/04/2018  (a) everolimus  started 01/29/2018  (b) liver MRI and bone scan December 2019 show stable disease  (c) chest CT scan on 08/02/2018 shows lung lesions resolved, bone lesions stable  (d) bone scan 09/13/2018 essentially stable  (e) liver MRI 12/08/2018 shows improvement in hepatic metastsases  (f) liver MRI and bone scan 04/13/2019 showed continuing improvement in the liver, stable bone lesions  (g) chest CT 08/01/2019 stable, no evidence of active disease ows  (12) 01/28/18 prophylactic left femur intramedullary nail surgery for impending left femur pathologic fracture  (a) palliative radiation with Dr. [name] to the left femur and left ilium from 02/16/2018 to 03/01/2018  (13) [facility] report on liver biopsy from April 2019 shows a negative PDL 1, stable MSI and proficient mismatch repair status.  The tumor is positive for the androgen receptor, and for PTEN for PIK3 CA mutations.  HER-2/neu was read as equivocal (it was negative by FISH on the pathology report).  (14) Progression noted in liver MRI on 01/11/2020; fulvestrant started on 01/30/2020  (a) alpelisib started 01/30/2020 at 300 mg daily with Metformin 500 mg daily  (b) alpelisib held on 02/07/2020 with very high blood sugars.  Metformin increased to 1 g daily  (c) alpelisib resumed at 150 mg daily on 02/13/2020  (d) liver MRI 05/06/2020 shows evidence of disease progression  (e) fulvestrant and alpelisib discontinued January 2022  (15) capecitabine started 05/22/2020 at 1.5 g twice daily, 14/7  (a) abdominal MRI 08/23/2020 shows worsening liver disease  (b) chest CT scan 09/04/2020 shows new lung metastases  (c) capecitabine discontinued April 2022  (16) paclitaxel started 09/10/2020, repeated days 1, 8, and 15 of each 28-day cycle  (A) changed to every 2 weeks beginning with cycle 3 (11/05/2020)  (B) MRI of the abdomen 02/09/2021 shows evidence of response  (C) chest CT 02/10/2021 shows stable disease   PLAN: Chantavia is now in her fifth month of  Taxol.  The liver scan in particular shows evidence of measurable response.  The chest CT is essentially stable.  These are very good results at this point.  We do have grade 1 peripheral neuropathy particularly involving the left foot.  This does not appear progressive.  I have cautioned Debbie [name] against being so motivated to get treated that she minimizes the neuropathy.  If she does note a change she really needs to tell us as we have many other possible treatments we can give her.  However if there is no progression in the neuropathy the plan is to continue the paclitaxel every 2 weeks and I have set her up for the next several treatments.  She will be completely restaged at the end of December  Total encounter time 25 minutes.Sarajane Jews C. Cam Dauphin, MD 02/11/21 8:27 AM [facility]  [address] [place], [place] [address] [address]. [phone]    Fax. [phone]   I, Wilburn Mylar, am acting as scribe for Dr. Virgie Dad. Vlad Mayberry.  I, Lurline Del MD, have reviewed the above documentation for accuracy and completeness, and I agree with the above.   *Total Encounter Time as defined by the Centers for Medicare and Medicaid Services includes, in addition to the face-to-face time of a patient visit (documented in the note above) non-face-to-face time: obtaining and reviewing outside history, ordering and reviewing medications, tests or procedures, care coordination (communications with other health care professionals or caregivers) and documentation in the medical record.

## 2021-02-11 ENCOUNTER — Ambulatory Visit (HOSPITAL_BASED_OUTPATIENT_CLINIC_OR_DEPARTMENT_OTHER): Payer: 59 | Admitting: Physical Therapy

## 2021-02-11 ENCOUNTER — Inpatient Hospital Stay: Payer: 59

## 2021-02-11 ENCOUNTER — Encounter (HOSPITAL_BASED_OUTPATIENT_CLINIC_OR_DEPARTMENT_OTHER): Payer: Self-pay | Admitting: Physical Therapy

## 2021-02-11 ENCOUNTER — Encounter: Payer: 59 | Admitting: Rehabilitation

## 2021-02-11 ENCOUNTER — Other Ambulatory Visit: Payer: 59

## 2021-02-11 ENCOUNTER — Other Ambulatory Visit: Payer: Self-pay

## 2021-02-11 ENCOUNTER — Inpatient Hospital Stay (HOSPITAL_BASED_OUTPATIENT_CLINIC_OR_DEPARTMENT_OTHER): Payer: 59 | Admitting: Oncology

## 2021-02-11 VITALS — BP 160/73 | HR 95 | Temp 97.5°F | Wt 298.4 lb

## 2021-02-11 DIAGNOSIS — Z170 Estrogen receptor positive status [ER+]: Secondary | ICD-10-CM | POA: Insufficient documentation

## 2021-02-11 DIAGNOSIS — Z23 Encounter for immunization: Secondary | ICD-10-CM | POA: Insufficient documentation

## 2021-02-11 DIAGNOSIS — R293 Abnormal posture: Secondary | ICD-10-CM | POA: Insufficient documentation

## 2021-02-11 DIAGNOSIS — C50112 Malignant neoplasm of central portion of left female breast: Secondary | ICD-10-CM

## 2021-02-11 DIAGNOSIS — Z9013 Acquired absence of bilateral breasts and nipples: Secondary | ICD-10-CM | POA: Insufficient documentation

## 2021-02-11 DIAGNOSIS — C787 Secondary malignant neoplasm of liver and intrahepatic bile duct: Secondary | ICD-10-CM | POA: Insufficient documentation

## 2021-02-11 DIAGNOSIS — C7951 Secondary malignant neoplasm of bone: Secondary | ICD-10-CM

## 2021-02-11 DIAGNOSIS — Z923 Personal history of irradiation: Secondary | ICD-10-CM | POA: Insufficient documentation

## 2021-02-11 DIAGNOSIS — G629 Polyneuropathy, unspecified: Secondary | ICD-10-CM | POA: Insufficient documentation

## 2021-02-11 DIAGNOSIS — M6281 Muscle weakness (generalized): Secondary | ICD-10-CM | POA: Insufficient documentation

## 2021-02-11 DIAGNOSIS — C50919 Malignant neoplasm of unspecified site of unspecified female breast: Secondary | ICD-10-CM | POA: Insufficient documentation

## 2021-02-11 DIAGNOSIS — C50211 Malignant neoplasm of upper-inner quadrant of right female breast: Secondary | ICD-10-CM

## 2021-02-11 DIAGNOSIS — Z5111 Encounter for antineoplastic chemotherapy: Secondary | ICD-10-CM | POA: Insufficient documentation

## 2021-02-11 DIAGNOSIS — G8929 Other chronic pain: Secondary | ICD-10-CM | POA: Insufficient documentation

## 2021-02-11 DIAGNOSIS — Z95828 Presence of other vascular implants and grafts: Secondary | ICD-10-CM

## 2021-02-11 DIAGNOSIS — M545 Low back pain, unspecified: Secondary | ICD-10-CM | POA: Insufficient documentation

## 2021-02-11 LAB — CBC WITH DIFFERENTIAL/PLATELET
Abs Immature Granulocytes: 0.01 10*3/uL (ref 0.00–0.07)
Basophils Absolute: 0 10*3/uL (ref 0.0–0.1)
Basophils Relative: 1 %
Eosinophils Absolute: 0.1 10*3/uL (ref 0.0–0.5)
Eosinophils Relative: 3 %
HCT: 32.9 % — ABNORMAL LOW (ref 36.0–46.0)
Hemoglobin: 10.7 g/dL — ABNORMAL LOW (ref 12.0–15.0)
Immature Granulocytes: 0 %
Lymphocytes Relative: 13 %
Lymphs Abs: 0.6 10*3/uL — ABNORMAL LOW (ref 0.7–4.0)
MCH: 29.9 pg (ref 26.0–34.0)
MCHC: 32.5 g/dL (ref 30.0–36.0)
MCV: 91.9 fL (ref 80.0–100.0)
Monocytes Absolute: 0.5 10*3/uL (ref 0.1–1.0)
Monocytes Relative: 11 %
Neutro Abs: 3.5 10*3/uL (ref 1.7–7.7)
Neutrophils Relative %: 72 %
Platelets: 260 10*3/uL (ref 150–400)
RBC: 3.58 MIL/uL — ABNORMAL LOW (ref 3.87–5.11)
RDW: 17 % — ABNORMAL HIGH (ref 11.5–15.5)
WBC: 4.8 10*3/uL (ref 4.0–10.5)
nRBC: 0 % (ref 0.0–0.2)

## 2021-02-11 LAB — COMPREHENSIVE METABOLIC PANEL
ALT: 17 U/L (ref 0–44)
AST: 16 U/L (ref 15–41)
Albumin: 3.3 g/dL — ABNORMAL LOW (ref 3.5–5.0)
Alkaline Phosphatase: 121 U/L (ref 38–126)
Anion gap: 13 (ref 5–15)
BUN: 13 mg/dL (ref 6–20)
CO2: 28 mmol/L (ref 22–32)
Calcium: 9.5 mg/dL (ref 8.9–10.3)
Chloride: 101 mmol/L (ref 98–111)
Creatinine, Ser: 0.85 mg/dL (ref 0.44–1.00)
GFR, Estimated: >60 mL/min (ref >60)
Glucose, Bld: 116 mg/dL — ABNORMAL HIGH (ref 70–99)
Potassium: 3.2 mmol/L — ABNORMAL LOW (ref 3.5–5.1)
Sodium: 142 mmol/L (ref 135–145)
Total Bilirubin: 0.4 mg/dL (ref 0.3–1.2)
Total Protein: 6.6 g/dL (ref 6.5–8.1)

## 2021-02-11 MED ORDER — SODIUM CHLORIDE 0.9 % IV SOLN
80.0000 mg/m2 | Freq: Once | INTRAVENOUS | Status: AC
  Administered 2021-02-11: 192 mg via INTRAVENOUS
  Filled 2021-02-11: qty 32

## 2021-02-11 MED ORDER — SODIUM CHLORIDE 0.9% FLUSH
10.0000 mL | INTRAVENOUS | Status: DC | PRN
  Administered 2021-02-11: 10 mL

## 2021-02-11 MED ORDER — SODIUM CHLORIDE 0.9% FLUSH
10.0000 mL | INTRAVENOUS | Status: DC | PRN
  Administered 2021-02-11: 10 mL via INTRAVENOUS

## 2021-02-11 MED ORDER — DIPHENHYDRAMINE HCL 50 MG/ML IJ SOLN
25.0000 mg | Freq: Once | INTRAMUSCULAR | Status: AC
  Administered 2021-02-11: 25 mg via INTRAVENOUS
  Filled 2021-02-11: qty 1

## 2021-02-11 MED ORDER — SODIUM CHLORIDE 0.9 % IV SOLN: Freq: Once | INTRAVENOUS | Status: AC

## 2021-02-11 MED ORDER — FAMOTIDINE 20 MG IN NS 100 ML IVPB
20.0000 mg | Freq: Once | INTRAVENOUS | Status: AC
  Administered 2021-02-11: 20 mg via INTRAVENOUS
  Filled 2021-02-11: qty 100

## 2021-02-11 MED ORDER — HEPARIN SOD (PORK) LOCK FLUSH 100 UNIT/ML IV SOLN
500.0000 [IU] | Freq: Once | INTRAVENOUS | Status: AC | PRN
  Administered 2021-02-11: 500 [IU]

## 2021-02-11 MED ORDER — DENOSUMAB 120 MG/1.7ML ~~LOC~~ SOLN
120.0000 mg | Freq: Once | SUBCUTANEOUS | Status: AC
Start: 2021-02-11 — End: 2021-02-11
  Administered 2021-02-11: 120 mg via SUBCUTANEOUS
  Filled 2021-02-11: qty 1.7

## 2021-02-11 MED ORDER — SODIUM CHLORIDE 0.9 % IV SOLN
10.0000 mg | Freq: Once | INTRAVENOUS | Status: AC
  Administered 2021-02-11: 10 mg via INTRAVENOUS
  Filled 2021-02-11: qty 10

## 2021-02-11 NOTE — Therapy (Signed)
[facility] [address] [place], [place], [address] Phone: [phone]   Fax:  [phone]  Physical Therapy Treatment  Patient Details  Name: Debbie [name] MRN: [id number] Date of Birth: [birth year] Referring Provider (PT): Dr. [name]   Encounter Date: 02/11/2021   PT End of Session - 02/11/21 1354     Visit Number 8    Number of Visits 12    Date for PT Re-Evaluation 02/18/21    PT Start Time 7169    PT Stop Time 1440    PT Time Calculation (min) 45 min    Equipment Utilized During Treatment Other (comment)    Activity Tolerance Patient tolerated treatment well    Behavior During Therapy [facility] for tasks assessed/performed             Past Medical History:  Diagnosis Date   Anxiety    Arthritis    Bone metastasis ([place])    Breast cancer of upper-inner quadrant of right female breast [facility]) oncologist-  dr [name]--- 04/ 2019 ER/PR+ Stage IV w/ metastatic disease to liver, total spine, lung, bone   dx 06/ 2017  right breast invasive ductal carcinoma, Stage IIB, ER/PR + (pT2 pN1), grade 1-- s/p  right breast lumpecotmy w/ sln bx's and bilateral breast reduction 11-28-2015/  left breast with reduction , dx invasive ductal ca, Grade 1 (pT1cpNX) ER/PR positive/  adjuvant radiation completed 03-27-2016 to right chest wall, 2018  reclassifiec Stage IIA   Cancer, metastatic to liver ([place])    Chronic back pain    Constipation    Depression    Family history of adverse reaction to anesthesia     mother has Copd was kept on vent    Headache    Heart murmur    History of hyperthyroidism    s/p  RAI   History of radiation therapy 02/11/16- 03/27/16   Right Chest Wall 50.4 Gy in 28 fractions, Right Chest Wall Boost 10 Gy in 5 fractions.    Hyperlipidemia    Hypertension    Hypothyroidism, postradioiodine therapy    endocrinoloigst-  dr balan--  2004  s/p  RAI   Menorrhagia 2011   Multiple pulmonary nodules     secondary  metastatic   PONV (postoperative nausea and vomiting)    patch helps    SUI (stress urinary incontinence, female)    Wears contact lenses    Wears dentures    full upper and lower partial    Past Surgical History:  Procedure Laterality Date   BREAST LUMPECTOMY WITH NEEDLE LOCALIZATION AND AXILLARY SENTINEL LYMPH NODE BX Right 11/28/2015   Procedure: RIGHT BREAST LUMPECTOMY WITH NEEDLE LOCALIZATION AND AXILLARY SENTINEL LYMPH NODE BX;  Surgeon: Autumn Messing III, MD;  Location: [facility];  Service: General;  Laterality: Right;   BREAST REDUCTION SURGERY Bilateral 11/28/2015   Procedure: MAMMARY REDUCTION  (BREAST) BILATERAL;  Surgeon: Crissie Reese, MD;  Location: [facility];  Service: Plastics;  Laterality: Bilateral;   CESAREAN SECTION  01/12/1993   COLONOSCOPY     Dental surgeries     FEMUR IM NAIL Left 01/28/2018   Procedure: INTRAMEDULLARY (IM) NAIL FEMORAL;  Surgeon: Nicholes Stairs, MD;  Location: [facility];  Service: Orthopedics;  Laterality: Left;   HYSTEROSCOPY N/A 09/02/2017   Procedure: HYSTEROSCOPY  to remove IUD;  Surgeon: Eldred Manges, MD;  Location: [facility];  Service: Gynecology;  Laterality: N/A;   IR IMAGING GUIDED PORT INSERTION  09/16/2020  Liver biospy     MASTECTOMY     MASTECTOMY W/ SENTINEL NODE BIOPSY Bilateral 12/26/2015   Procedure: BILATERAL MASTECTOMY WITH LEFT SENTINEL LYMPH NODE BIOPSY;  Surgeon: Autumn Messing III, MD;  Location: [facility];  Service: General;  Laterality: Bilateral;   Removal of IUD     TUBAL LIGATION Bilateral 1995    There were no vitals filed for this visit.   Subjective Assessment - 02/11/21 1442     Subjective Feel pretty good, just finished chemo earlier today.                       Flowsheet Row Outpatient Rehab from 12/08/2018 in [facility]  Lymphedema Life Impact Scale Total Score 11.76 %                           PT Long Term Goals -  01/07/21 1352       PT LONG TERM GOAL #1   Title Pt will be independent with HEP for lumbar, core and generalized strength    Time 6    Period Weeks    Status New    Target Date 02/18/21      PT LONG TERM GOAL #2   Title Pt will have LBP decreased by atleast 50%    Time 6    Period Weeks    Status New    Target Date 02/18/21      PT LONG TERM GOAL #3   Title pt will be able to shower without sitting on shower chair    Time 6    Period Weeks    Status New    Target Date 02/18/21      PT LONG TERM GOAL #4   Title pt will be able to wash dishes for 10-20 minutes with minimal complaints of LBP    Time 6    Period Weeks    Target Date 02/18/21      PT LONG TERM GOAL #5   Title pt will be able to rise from chair without pain or stiffness    Time 6    Period Weeks    Status New    Target Date 02/18/21               Pt seen for aquatic therapy today.  Treatment took place in water 3.25-4.8 ft in depth at the [employer] [facility] pool. Temp of water was 95.  Pt entered/exited the pool via stairs step to pattern independently with bilat rail.   Warm up: forward, backward and side stepping/walking cues for increased step length, increased speed, hand placement to increase resistance. 6 widths     Seated Stretching: gastroc, adductors and hamstrings 5 x 20 second hold   Standing Strengthening and balance training:  -High knee marching/jogging 3 x 15 reps.  Pulse max 125 O2 sats 95-96%% supported by yellow noodle -adduction/abd holding to wall 2 x 10 - hip ext 2 x10 -knee ext 2 x 10  Added: -noodle kick down 3 x 10 reps hip in neutral and then external rotation.  -Kick board push down 3x10 reps added manual perturbation (by PT)  Aerobic capacity training: 2 sets of 4 widths water walking pushing noodle for added resistance.  All exercises pt cues to increase speed for added arobic capacity challenges and greater resistance.      Water walking warm down VC for  increased step length. X  6 widths, pt instructed on long steps/lunges and spped.  O2 sat and pulse monitored throughout        Plan - 02/11/21 1426     Clinical Impression Statement Pt just coming from chemo infusion. Added noodle kick downs today for balance retraining and core strengthening.  Monitored O2 sats and pulse throughout. Pt O2 sats up 1-2 % throughout today, pulse max up as well but intensity today os increased as evidence of increased aerobic capacity and strength. Pt given 3 seated recovery periods. Pt always put forth excellent effort.    Clinical Decision Making Low    Rehab Potential Good    PT Frequency 2x / week    PT Duration 4 weeks    PT Treatment/Interventions Aquatic Therapy;ADLs/Self Care Home Management;Therapeutic exercise;Neuromuscular re-education;Manual techniques;Patient/family education;Therapeutic activities    PT Next Visit Plan advance core strengthening and aerboic capacity challenges.    PT Home Exercise Plan on land exercises             Patient will benefit from skilled therapeutic intervention in order to improve the following deficits and impairments:  Difficulty walking, Pain, Decreased knowledge of precautions, Decreased activity tolerance, Postural dysfunction, Decreased strength, Decreased mobility  Visit Diagnosis: Chronic bilateral low back pain without sciatica  Abnormal posture  Muscle weakness (generalized)     Problem List Patient Active Problem List   Diagnosis Date Noted   Encounter for antineoplastic chemotherapy 10/08/2020   Lower extremity edema 10/08/2020   Lung metastases ([place]) 09/10/2020   Goals of care, counseling/discussion 12/27/2018   Osteonecrosis ([place]) 11/01/2018   Pathological fracture in neoplastic disease, left femur, initial encounter for fracture ([place]) 01/28/2018   Morbid obesity with BMI of 40.0-44.9, adult ([address]) 26/33/3545   IUD complication ([place]) 62/56/3893   Bone metastases ([place]) 08/13/2017    Pain from bone metastases ([place]) 08/13/2017   Liver metastases ([place]) 08/13/2017   Steatosis of liver 08/13/2017   Bilateral malignant neoplasm of breast in female, estrogen receptor positive ([place]) 12/26/2015   Malignant neoplasm of central portion of left breast in female, estrogen receptor positive ([place]) 11/28/2015   Malignant neoplasm of upper-inner quadrant of right breast in female, estrogen receptor positive ([place]) 10/17/2015   Hypertension 04/19/2012   Hypothyroid 04/19/2012   Fibroids 04/19/2012   Stress incontinence, female 04/19/2012    Annamarie Major) Hena Ewalt MPT  02/11/2021, 4:12 PM  [facility] [address] [place], [place], [address] Phone: [phone]   Fax:  [phone]  Name: Debbie [name] MRN: [id number] Date of Birth: [birth year]

## 2021-02-11 NOTE — Patient Instructions (Signed)
[facility]  Discharge Instructions: Thank you for choosing [facility] to provide your oncology and hematology care.   If you have a lab appointment with the [facility], please go directly to the [facility] and check in at the registration area.   Wear comfortable clothing and clothing appropriate for easy access to any Portacath or PICC line.   We strive to give you quality time with your provider. You may need to reschedule your appointment if you arrive late (15 or more minutes).  Arriving late affects you and other patients whose appointments are after yours.  Also, if you miss three or more appointments without notifying the office, you may be dismissed from the clinic at the provider's discretion.      For prescription refill requests, have your pharmacy contact our office and allow 72 hours for refills to be completed.    Today you received the following chemotherapy and/or immunotherapy agents: Taxol     To help prevent nausea and vomiting after your treatment, we encourage you to take your nausea medication as directed.  BELOW ARE SYMPTOMS THAT SHOULD BE REPORTED IMMEDIATELY: *FEVER GREATER THAN 100.4 F (38 C) OR HIGHER *CHILLS OR SWEATING *NAUSEA AND VOMITING THAT IS NOT CONTROLLED WITH YOUR NAUSEA MEDICATION *UNUSUAL SHORTNESS OF BREATH *UNUSUAL BRUISING OR BLEEDING *URINARY PROBLEMS (pain or burning when urinating, or frequent urination) *BOWEL PROBLEMS (unusual diarrhea, constipation, pain near the anus) TENDERNESS IN MOUTH AND THROAT WITH OR WITHOUT PRESENCE OF ULCERS (sore throat, sores in mouth, or a toothache) UNUSUAL RASH, SWELLING OR PAIN  UNUSUAL VAGINAL DISCHARGE OR ITCHING   Items with * indicate a potential emergency and should be followed up as soon as possible or go to the Emergency Department if any problems should occur.  Please show the CHEMOTHERAPY ALERT CARD or IMMUNOTHERAPY ALERT CARD at check-in to the  Emergency Department and triage nurse.  Should you have questions after your visit or need to cancel or reschedule your appointment, please contact [facility]  Dept: [phone]  and follow the prompts.  Office hours are 8:00 a.m. to 4:30 p.m. Monday - Friday. Please note that voicemails left after 4:00 p.m. may not be returned until the following business day.  We are closed weekends and major holidays. You have access to a nurse at all times for urgent questions. Please call the main number to the clinic Dept: [phone] and follow the prompts.   For any non-urgent questions, you may also contact your provider using MyChart. We now offer e-Visits for anyone 24 and older to request care online for non-urgent symptoms. For details visit [email or web address].   Also download the MyChart app! Go to the app store, search "MyChart", open the app, select [facility], and log in with your MyChart username and password.  Due to Covid, a mask is required upon entering the hospital/clinic. If you do not have a mask, one will be given to you upon arrival. For doctor visits, patients may have 1 support person aged 60 or older with them. For treatment visits, patients cannot have anyone with them due to current Covid guidelines and our immunocompromised population.   Denosumab injection What is this medication? DENOSUMAB (den oh sue mab) slows bone breakdown. Prolia is used to treat osteoporosis in women after menopause and in men, and in people who are taking corticosteroids for 6 months or more. Delton See is used to treat a high calcium level due to  cancer and to prevent bone fractures and other bone problems caused by multiple myeloma or cancer bone metastases. Delton See is also used to treat giant cell tumor of the bone. This medicine may be used for other purposes; ask your health care provider or pharmacist if you have questions. COMMON BRAND NAME(S): Prolia, XGEVA What should  I tell my care team before I take this medication? They need to know if you have any of these conditions: dental disease having surgery or tooth extraction infection kidney disease low levels of calcium or Vitamin D in the blood malnutrition on hemodialysis skin conditions or sensitivity thyroid or parathyroid disease an unusual reaction to denosumab, other medicines, foods, dyes, or preservatives pregnant or trying to get pregnant breast-feeding How should I use this medication? This medicine is for injection under the skin. It is given by a health care professional in a hospital or clinic setting. A special MedGuide will be given to you before each treatment. Be sure to read this information carefully each time. For Prolia, talk to your pediatrician regarding the use of this medicine in children. Special care may be needed. For Delton See, talk to your pediatrician regarding the use of this medicine in children. While this drug may be prescribed for children as young as 13 years for selected conditions, precautions do apply. Overdosage: If you think you have taken too much of this medicine contact a poison control center or emergency room at once. NOTE: This medicine is only for you. Do not share this medicine with others. What if I miss a dose? It is important not to miss your dose. Call your doctor or health care professional if you are unable to keep an appointment. What may interact with this medication? Do not take this medicine with any of the following medications: other medicines containing denosumab This medicine may also interact with the following medications: medicines that lower your chance of fighting infection steroid medicines like prednisone or cortisone This list may not describe all possible interactions. Give your health care provider a list of all the medicines, herbs, non-prescription drugs, or dietary supplements you use. Also tell them if you smoke, drink alcohol, or  use illegal drugs. Some items may interact with your medicine. What should I watch for while using this medication? Visit your doctor or health care professional for regular checks on your progress. Your doctor or health care professional may order blood tests and other tests to see how you are doing. Call your doctor or health care professional for advice if you get a fever, chills or sore throat, or other symptoms of a cold or flu. Do not treat yourself. This drug may decrease your body's ability to fight infection. Try to avoid being around people who are sick. You should make sure you get enough calcium and vitamin D while you are taking this medicine, unless your doctor tells you not to. Discuss the foods you eat and the vitamins you take with your health care professional. See your dentist regularly. Brush and floss your teeth as directed. Before you have any dental work done, tell your dentist you are receiving this medicine. Do not become pregnant while taking this medicine or for 5 months after stopping it. Talk with your doctor or health care professional about your birth control options while taking this medicine. Women should inform their doctor if they wish to become pregnant or think they might be pregnant. There is a potential for serious side effects to an unborn child. Talk  to your health care professional or pharmacist for more information. What side effects may I notice from receiving this medication? Side effects that you should report to your doctor or health care professional as soon as possible: allergic reactions like skin rash, itching or hives, swelling of the face, lips, or tongue bone pain breathing problems dizziness jaw pain, especially after dental work redness, blistering, peeling of the skin signs and symptoms of infection like fever or chills; cough; sore throat; pain or trouble passing urine signs of low calcium like fast heartbeat, muscle cramps or muscle pain;  pain, tingling, numbness in the hands or feet; seizures unusual bleeding or bruising unusually weak or tired Side effects that usually do not require medical attention (report to your doctor or health care professional if they continue or are bothersome): constipation diarrhea headache joint pain loss of appetite muscle pain runny nose tiredness upset stomach This list may not describe all possible side effects. Call your doctor for medical advice about side effects. You may report side effects to FDA at 1-800-FDA-1088. Where should I keep my medication? This medicine is only given in a clinic, doctor's office, or other health care setting and will not be stored at home. NOTE: This sheet is a summary. It may not cover all possible information. If you have questions about this medicine, talk to your doctor, pharmacist, or health care provider.  2022 Elsevier/Gold Standard (2017-09-03 16:10:44)

## 2021-02-12 LAB — CANCER ANTIGEN 27.29: CA 27.29: 135.4 U/mL — ABNORMAL HIGH (ref 0.0–38.6)

## 2021-02-13 ENCOUNTER — Ambulatory Visit: Payer: 59 | Attending: Hematology and Oncology | Admitting: Physical Therapy

## 2021-02-13 ENCOUNTER — Other Ambulatory Visit: Payer: Self-pay

## 2021-02-13 ENCOUNTER — Encounter: Payer: Self-pay | Admitting: Physical Therapy

## 2021-02-13 DIAGNOSIS — M545 Low back pain, unspecified: Secondary | ICD-10-CM | POA: Diagnosis not present

## 2021-02-13 DIAGNOSIS — C50919 Malignant neoplasm of unspecified site of unspecified female breast: Secondary | ICD-10-CM | POA: Diagnosis not present

## 2021-02-13 DIAGNOSIS — M6281 Muscle weakness (generalized): Secondary | ICD-10-CM | POA: Diagnosis not present

## 2021-02-13 DIAGNOSIS — G8929 Other chronic pain: Secondary | ICD-10-CM | POA: Diagnosis not present

## 2021-02-13 DIAGNOSIS — R293 Abnormal posture: Secondary | ICD-10-CM | POA: Diagnosis not present

## 2021-02-13 NOTE — Therapy (Signed)
[facility] @ [place], [place], [address] Phone:     Fax:     Physical Therapy Treatment  Patient Details  Name: Debbie [name] MRN: [id number] Date of Birth: 02/17/1966 Referring Provider (PT): Dr. [name]   Encounter Date: 02/13/2021   PT End of Session - 02/13/21 1454     Visit Number 9    Number of Visits 17    Date for PT Re-Evaluation 04/10/21    PT Start Time 6226    PT Stop Time 1450    PT Time Calculation (min) 53 min    Activity Tolerance Patient tolerated treatment well    Behavior During Therapy [facility] for tasks assessed/performed             Past Medical History:  Diagnosis Date   Anxiety    Arthritis    Bone metastasis ([place])    Breast cancer of upper-inner quadrant of right female breast [facility]) oncologist-  dr [name]--- 04/ 2019 ER/PR+ Stage IV w/ metastatic disease to liver, total spine, lung, bone   dx 06/ 2017  right breast invasive ductal carcinoma, Stage IIB, ER/PR + (pT2 pN1), grade 1-- s/p  right breast lumpecotmy w/ sln bx's and bilateral breast reduction 11-28-2015/  left breast with reduction , dx invasive ductal ca, Grade 1 (pT1cpNX) ER/PR positive/  adjuvant radiation completed 03-27-2016 to right chest wall, 2018  reclassifiec Stage IIA   Cancer, metastatic to liver ([place])    Chronic back pain    Constipation    Depression    Family history of adverse reaction to anesthesia     mother has Copd was kept on vent    Headache    Heart murmur    History of hyperthyroidism    s/p  RAI   History of radiation therapy 02/11/16- 03/27/16   Right Chest Wall 50.4 Gy in 28 fractions, Right Chest Wall Boost 10 Gy in 5 fractions.    Hyperlipidemia    Hypertension    Hypothyroidism, postradioiodine therapy    endocrinoloigst-  dr balan--  2004  s/p  RAI   Menorrhagia 2011   Multiple pulmonary nodules    secondary  metastatic   PONV (postoperative nausea and vomiting)    patch  helps    SUI (stress urinary incontinence, female)    Wears contact lenses    Wears dentures    full upper and lower partial    Past Surgical History:  Procedure Laterality Date   BREAST LUMPECTOMY WITH NEEDLE LOCALIZATION AND AXILLARY SENTINEL LYMPH NODE BX Right 11/28/2015   Procedure: RIGHT BREAST LUMPECTOMY WITH NEEDLE LOCALIZATION AND AXILLARY SENTINEL LYMPH NODE BX;  Surgeon: Autumn Messing III, MD;  Location: [facility];  Service: General;  Laterality: Right;   BREAST REDUCTION SURGERY Bilateral 11/28/2015   Procedure: MAMMARY REDUCTION  (BREAST) BILATERAL;  Surgeon: Crissie Reese, MD;  Location: [place];  Service: Plastics;  Laterality: Bilateral;   CESAREAN SECTION  01/12/1993   COLONOSCOPY     Dental surgeries     FEMUR IM NAIL Left 01/28/2018   Procedure: INTRAMEDULLARY (IM) NAIL FEMORAL;  Surgeon: Nicholes Stairs, MD;  Location: [place];  Service: Orthopedics;  Laterality: Left;   HYSTEROSCOPY N/A 09/02/2017   Procedure: HYSTEROSCOPY  to remove IUD;  Surgeon: Eldred Manges, MD;  Location: [place];  Service: Gynecology;  Laterality: N/A;   IR IMAGING GUIDED PORT INSERTION  09/16/2020   Liver biospy  MASTECTOMY     MASTECTOMY W/ SENTINEL NODE BIOPSY Bilateral 12/26/2015   Procedure: BILATERAL MASTECTOMY WITH LEFT SENTINEL LYMPH NODE BIOPSY;  Surgeon: Autumn Messing III, MD;  Location: [facility];  Service: General;  Laterality: Bilateral;   Removal of IUD     TUBAL LIGATION Bilateral 1995    There were no vitals filed for this visit.   Subjective Assessment - 02/13/21 1405     Subjective I am having a down day today. I just worked 12 hours and I had a treatment. My energy level is not where it should be.    Pertinent History Stage IV breast Cancer s/p Bilateral Mastectomies with METS to Liver, spine, lung, Bone.  Has left  Femur IM rod.  Presently on Paclitaxel Day 1 and 15 of 28 day cycle.  Stable Right UE lymphedema. 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 Yes   no pain when at rest   Pain Score 4     Pain Location Hip    Pain Orientation Left    Pain Descriptors / Indicators Heaviness    Pain Type Chronic pain    Pain Onset More than a month ago    Pain Frequency Intermittent    Aggravating Factors  moving, walking    Pain Relieving Factors resting, sitting    Effect of Pain on Daily Activities pt does not allow it to, may slow her down                       Flowsheet Row Outpatient [facility]08/[facility]  Lymphedema Life Impact Scale Total Score 11.76 %             [facility] Adult PT Treatment/Exercise - 02/13/21 0001       Lumbar Exercises: Aerobic   Nustep Level 1 for 5 min 30 sec with no increase in pain      Lumbar Exercises: Supine   Ab Set 10 reps;5 seconds    AB Set Limitations v/c to coordinate with diaphramatic breathing and on how to get proper core contraction, pt required several reps before getting correct techinque    Clam 10 reps    Clam Limitations both with slow out/in   coordinated with TrA contraction   Bent Knee Raise 10 reps    Bent Knee Raise Limitations with TrA small lift of 4" and 3 sec holds    Other Supine Lumbar Exercises ball squeeze in hook lying x 2 sec holds x 10 reps      Knee/Hip Exercises: Standing   Hip Flexion Stengthening;Both;1 set;10 reps    Hip Flexion Limitations HHA on counter    Hip Abduction Both;10 reps    Abduction Limitations HHA on counter    Hip Extension Stengthening;Both;1 set;10 reps    Extension Limitations HHA on counter                          PT Long Term Goals - 02/13/21 1407       PT LONG TERM GOAL #1   Title Pt will be independent with HEP for lumbar, core and generalized strength    Time 6    Period Weeks    Status On-going      PT LONG TERM GOAL #2   Title Pt will  have LBP  decreased by atleast 50%    Baseline 02/13/21- 50% improvement    Time 6    Period Weeks    Status Achieved      PT LONG TERM GOAL #3   Title pt will be able to shower without sitting on shower chair    Baseline 02/13/21- pt still requires a shower chair because at the end of the day her back hurts    Time 6    Period Weeks    Status On-going      PT LONG TERM GOAL #4   Title pt will be able to wash dishes for 10-20 minutes with minimal complaints of LBP    Baseline 02/13/21- pt reports this has reamained about the same, she needs to lean on the sink    Time 6    Period Weeks    Status On-going      PT LONG TERM GOAL #5   Title pt will be able to rise from chair without pain or stiffness    Baseline 02/13/21- pt is able to do this    Time 6    Period Weeks    Status Achieved                   Plan - 02/13/21 1455     Clinical Impression Statement Assessed pt's progress towards goals in therapy. Pt has met several of her goals. Overall her back pain is decreasing and pt reports she is able to perform self hygiene without increased pain and also able to bend forward to put her shoes on without increased pain. She still becomes fatigued when standing for long periods of time. She is still requring initially to perform pelvic tilt correctly. Pt would benefit from additional skilled services to continue to improve strength especially core strength and decrease back pain. She will alternate aquatic therapy with land therapy and be seen once weekly.    PT Frequency 1x / week    PT Duration 8 weeks    PT Treatment/Interventions Aquatic Therapy;ADLs/Self Care Home Management;Therapeutic exercise;Neuromuscular re-education;Manual techniques;Patient/family education;Therapeutic activities    PT Next Visit Plan advance core strengthening and aerboic capacity challenges, continue improving core strength    PT Home Exercise Plan pelvic tilts, pelvic stab exercises    Consulted and Agree  with Plan of Care Patient             Patient will benefit from skilled therapeutic intervention in order to improve the following deficits and impairments:  Difficulty walking, Pain, Decreased knowledge of precautions, Decreased activity tolerance, Postural dysfunction, Decreased strength, Decreased mobility  Visit Diagnosis: Chronic bilateral low back pain without sciatica  Abnormal posture  Muscle weakness (generalized)  Primary malignant neoplasm of breast with metastasis to other site, unspecified laterality [facility])     Problem List Patient Active Problem List   Diagnosis Date Noted   Encounter for antineoplastic chemotherapy 10/08/2020   Lower extremity edema 10/08/2020   Lung metastases ([place]) 09/10/2020   Goals of care, counseling/discussion 12/27/2018   Osteonecrosis ([place]) 11/01/2018   Pathological fracture in neoplastic disease, left femur, initial encounter for fracture ([place]) 01/28/2018   Morbid obesity with BMI of 40.0-44.9, adult ([place]) 15/40/0867   IUD complication ([place]) 61/95/0932   Bone metastases ([place]) 08/13/2017   Pain from bone metastases ([place]) 08/13/2017   Liver metastases ([place]) 08/13/2017   Steatosis of liver 08/13/2017   Bilateral malignant neoplasm of breast in female, estrogen receptor positive ([place]) 12/26/2015  Malignant neoplasm of central portion of left breast in female, estrogen receptor positive ([place]) 11/28/2015   Malignant neoplasm of upper-inner quadrant of right breast in female, estrogen receptor positive ([place]) 10/17/2015   Hypertension 04/19/2012   Hypothyroid 04/19/2012   Fibroids 04/19/2012   Stress incontinence, female 04/19/2012    Manus Gunning, PT 02/13/2021, 2:59 PM  [facility] @ [place], [place], [address] Phone:     Fax:     Name: Debbie [name] MRN: [id number] Date of Birth: 1965/07/13   Manus Gunning, PT 02/13/21 2:59 PM

## 2021-02-18 ENCOUNTER — Ambulatory Visit (HOSPITAL_BASED_OUTPATIENT_CLINIC_OR_DEPARTMENT_OTHER): Payer: 59 | Admitting: Physical Therapy

## 2021-02-18 ENCOUNTER — Other Ambulatory Visit (HOSPITAL_COMMUNITY): Payer: Self-pay

## 2021-02-18 ENCOUNTER — Other Ambulatory Visit: Payer: Self-pay

## 2021-02-18 ENCOUNTER — Encounter (HOSPITAL_BASED_OUTPATIENT_CLINIC_OR_DEPARTMENT_OTHER): Payer: Self-pay | Admitting: Physical Therapy

## 2021-02-18 DIAGNOSIS — C50919 Malignant neoplasm of unspecified site of unspecified female breast: Secondary | ICD-10-CM | POA: Diagnosis not present

## 2021-02-18 DIAGNOSIS — G8929 Other chronic pain: Secondary | ICD-10-CM | POA: Diagnosis not present

## 2021-02-18 DIAGNOSIS — M6281 Muscle weakness (generalized): Secondary | ICD-10-CM | POA: Diagnosis not present

## 2021-02-18 DIAGNOSIS — R293 Abnormal posture: Secondary | ICD-10-CM

## 2021-02-18 DIAGNOSIS — M545 Low back pain, unspecified: Secondary | ICD-10-CM | POA: Diagnosis not present

## 2021-02-18 NOTE — Therapy (Signed)
[facility] [address] [place], [place], [address] Phone: [phone]   Fax:  [phone]  Physical Therapy Treatment  Patient Details  Name: Debbie [name] MRN: [id number] Date of Birth: 1965/12/01 Referring Provider (PT): Dr. [name]   Encounter Date: 02/18/2021   PT End of Session - 02/18/21 0911     Visit Number 10    Number of Visits 17    Date for PT Re-Evaluation 04/10/21    PT Start Time 0900    PT Stop Time 0945    PT Time Calculation (min) 45 min    Equipment Utilized During Treatment Other (comment)    Activity Tolerance Patient tolerated treatment well    Behavior During Therapy [facility] for tasks assessed/performed             Past Medical History:  Diagnosis Date   Anxiety    Arthritis    Bone metastasis ([place])    Breast cancer of upper-inner quadrant of right female breast [facility]) oncologist-  dr [name]--- 04/ 2019 ER/PR+ Stage IV w/ metastatic disease to liver, total spine, lung, bone   dx 06/ 2017  right breast invasive ductal carcinoma, Stage IIB, ER/PR + (pT2 pN1), grade 1-- s/p  right breast lumpecotmy w/ sln bx's and bilateral breast reduction 11-28-2015/  left breast with reduction , dx invasive ductal ca, Grade 1 (pT1cpNX) ER/PR positive/  adjuvant radiation completed 03-27-2016 to right chest wall, 2018  reclassifiec Stage IIA   Cancer, metastatic to liver ([place])    Chronic back pain    Constipation    Depression    Family history of adverse reaction to anesthesia     mother has Copd was kept on vent    Headache    Heart murmur    History of hyperthyroidism    s/p  RAI   History of radiation therapy 02/11/16- 03/27/16   Right Chest Wall 50.4 Gy in 28 fractions, Right Chest Wall Boost 10 Gy in 5 fractions.    Hyperlipidemia    Hypertension    Hypothyroidism, postradioiodine therapy    endocrinoloigst-  dr balan--  2004  s/p  RAI   Menorrhagia 2011   Multiple pulmonary nodules     secondary  metastatic   PONV (postoperative nausea and vomiting)    patch helps    SUI (stress urinary incontinence, female)    Wears contact lenses    Wears dentures    full upper and lower partial    Past Surgical History:  Procedure Laterality Date   BREAST LUMPECTOMY WITH NEEDLE LOCALIZATION AND AXILLARY SENTINEL LYMPH NODE BX Right 11/28/2015   Procedure: RIGHT BREAST LUMPECTOMY WITH NEEDLE LOCALIZATION AND AXILLARY SENTINEL LYMPH NODE BX;  Surgeon: Autumn Messing III, MD;  Location: [place];  Service: General;  Laterality: Right;   BREAST REDUCTION SURGERY Bilateral 11/28/2015   Procedure: MAMMARY REDUCTION  (BREAST) BILATERAL;  Surgeon: Crissie Reese, MD;  Location: [place];  Service: Plastics;  Laterality: Bilateral;   CESAREAN SECTION  01/12/1993   COLONOSCOPY     Dental surgeries     FEMUR IM NAIL Left 01/28/2018   Procedure: INTRAMEDULLARY (IM) NAIL FEMORAL;  Surgeon: Nicholes Stairs, MD;  Location: [place];  Service: Orthopedics;  Laterality: Left;   HYSTEROSCOPY N/A 09/02/2017   Procedure: HYSTEROSCOPY  to remove IUD;  Surgeon: Eldred Manges, MD;  Location: [facility];  Service: Gynecology;  Laterality: N/A;   IR IMAGING GUIDED PORT INSERTION  09/16/2020  Liver biospy     MASTECTOMY     MASTECTOMY W/ SENTINEL NODE BIOPSY Bilateral 12/26/2015   Procedure: BILATERAL MASTECTOMY WITH LEFT SENTINEL LYMPH NODE BIOPSY;  Surgeon: Autumn Messing III, MD;  Location: [facility];  Service: General;  Laterality: Bilateral;   Removal of IUD     TUBAL LIGATION Bilateral 1995    There were no vitals filed for this visit.   Subjective Assessment - 02/18/21 1056     Subjective i"m feeling pretty good"  No complaints or concerns.                       Flowsheet Row Outpatient Rehab from 12/08/2018 in [facility]  Lymphedema Life Impact Scale Total Score 11.76 %                           PT Long Term Goals -  02/13/21 1407       PT LONG TERM GOAL #1   Title Pt will be independent with HEP for lumbar, core and generalized strength    Time 6    Period Weeks    Status On-going      PT LONG TERM GOAL #2   Title Pt will have LBP decreased by atleast 50%    Baseline 02/13/21- 50% improvement    Time 6    Period Weeks    Status Achieved      PT LONG TERM GOAL #3   Title pt will be able to shower without sitting on shower chair    Baseline 02/13/21- pt still requires a shower chair because at the end of the day her back hurts    Time 6    Period Weeks    Status On-going      PT LONG TERM GOAL #4   Title pt will be able to wash dishes for 10-20 minutes with minimal complaints of LBP    Baseline 02/13/21- pt reports this has reamained about the same, she needs to lean on the sink    Time 6    Period Weeks    Status On-going      PT LONG TERM GOAL #5   Title pt will be able to rise from chair without pain or stiffness    Baseline 02/13/21- pt is able to do this    Time 6    Period Weeks    Status Achieved                Pt seen for aquatic therapy today.  Treatment took place in water 3.25-4.8 ft in depth at the [employer] [facility] pool. Temp of water was 95.  Pt entered/exited the pool via stairs step to pattern independently with bilat rail.   Warm up: forward, backward and side stepping/walking cues for increased step length, increased speed, hand placement to increase resistance. 6 widths     Seated Stretching: gastroc, adductors and hamstrings 5 x 20 second hold   Standing Strengthening and balance training:  -High knee marching/jogging 3 x 15 reps.  Pulse max 125 O2 sats 95-96%%  -adduction/abd holding to wall 2 x 10 - hip ext 2 x10 -knee ext 2 x 10 UE support 1 foam hand buoy then unilateral hand on wall   -noodle kick down 3 x 10 reps hip in neutral and then external rotation. With occasional unilateral support of hand on wall VC throughout for tight core.   -Kick  board push down 3x15 reps added manual perturbation (by PT) -kick board rotation R/L x 10 (added)   Aerobic capacity training: 2 sets of 4 widths water walking pushing noodle for added resistance.   Pulse max 101, O2 sats ~95 and above   All exercises pt cues to increase speed for added arobic capacity challenges and greater resistance.      Water walking warm down VC for increased step length. X 6 widths, pt instructed on long steps/lunges and spped.  O2 sat and pulse monitored throughout Patient will benefit from skilled therapeutic intervention in order to improve the following deficits and impairments:  Difficulty walking, Pain, Decreased knowledge of precautions, Decreased activity tolerance, Postural dysfunction, Decreased strength, Decreased mobility  Visit Diagnosis: Chronic bilateral low back pain without sciatica  Abnormal posture  Muscle weakness (generalized)     Problem List Patient Active Problem List   Diagnosis Date Noted   Encounter for antineoplastic chemotherapy 10/08/2020   Lower extremity edema 10/08/2020   Lung metastases ([place]) 09/10/2020   Goals of care, counseling/discussion 12/27/2018   Osteonecrosis ([place]) 11/01/2018   Pathological fracture in neoplastic disease, left femur, initial encounter for fracture ([place]) 01/28/2018   Morbid obesity with BMI of 40.0-44.9, adult ([place]) 59/45/8592   IUD complication ([place]) 92/44/6286   Bone metastases ([place]) 08/13/2017   Pain from bone metastases ([place]) 08/13/2017   Liver metastases ([place]) 08/13/2017   Steatosis of liver 08/13/2017   Bilateral malignant neoplasm of breast in female, estrogen receptor positive ([place]) 12/26/2015   Malignant neoplasm of central portion of left breast in female, estrogen receptor positive ([place]) 11/28/2015   Malignant neoplasm of upper-inner quadrant of right breast in female, estrogen receptor positive ([place]) 10/17/2015   Hypertension 04/19/2012   Hypothyroid 04/19/2012   Fibroids  04/19/2012   Stress incontinence, female 04/19/2012    Annamarie Major) Laelyn Blumenthal MPT 02/18/2021, 10:58 AM  [facility] [address] [place], [place], [address] Phone: [phone]   Fax:  [phone]  Name: ARLETT GOOLD MRN: [id number] Date of Birth: 1966-01-17

## 2021-02-24 MED FILL — Dexamethasone Sodium Phosphate Inj 100 MG/10ML: INTRAMUSCULAR | Qty: 1 | Status: AC

## 2021-02-25 ENCOUNTER — Inpatient Hospital Stay: Payer: 59

## 2021-02-25 ENCOUNTER — Other Ambulatory Visit: Payer: Self-pay

## 2021-02-25 ENCOUNTER — Inpatient Hospital Stay (HOSPITAL_BASED_OUTPATIENT_CLINIC_OR_DEPARTMENT_OTHER): Payer: 59 | Admitting: Adult Health

## 2021-02-25 VITALS — BP 177/79 | HR 96 | Temp 97.5°F | Resp 18 | Ht 66.0 in | Wt 299.4 lb

## 2021-02-25 DIAGNOSIS — C7800 Secondary malignant neoplasm of unspecified lung: Secondary | ICD-10-CM

## 2021-02-25 DIAGNOSIS — C7951 Secondary malignant neoplasm of bone: Secondary | ICD-10-CM | POA: Diagnosis not present

## 2021-02-25 DIAGNOSIS — C50211 Malignant neoplasm of upper-inner quadrant of right female breast: Secondary | ICD-10-CM

## 2021-02-25 DIAGNOSIS — C50111 Malignant neoplasm of central portion of right female breast: Secondary | ICD-10-CM

## 2021-02-25 DIAGNOSIS — R293 Abnormal posture: Secondary | ICD-10-CM | POA: Diagnosis not present

## 2021-02-25 DIAGNOSIS — C50919 Malignant neoplasm of unspecified site of unspecified female breast: Secondary | ICD-10-CM | POA: Diagnosis not present

## 2021-02-25 DIAGNOSIS — C787 Secondary malignant neoplasm of liver and intrahepatic bile duct: Secondary | ICD-10-CM

## 2021-02-25 DIAGNOSIS — Z170 Estrogen receptor positive status [ER+]: Secondary | ICD-10-CM

## 2021-02-25 DIAGNOSIS — Z95828 Presence of other vascular implants and grafts: Secondary | ICD-10-CM

## 2021-02-25 DIAGNOSIS — C50112 Malignant neoplasm of central portion of left female breast: Secondary | ICD-10-CM

## 2021-02-25 DIAGNOSIS — G8929 Other chronic pain: Secondary | ICD-10-CM | POA: Diagnosis not present

## 2021-02-25 DIAGNOSIS — M545 Low back pain, unspecified: Secondary | ICD-10-CM | POA: Diagnosis not present

## 2021-02-25 DIAGNOSIS — M6281 Muscle weakness (generalized): Secondary | ICD-10-CM | POA: Diagnosis not present

## 2021-02-25 LAB — CBC WITH DIFFERENTIAL/PLATELET
Abs Immature Granulocytes: 0.02 10*3/uL (ref 0.00–0.07)
Basophils Absolute: 0 10*3/uL (ref 0.0–0.1)
Basophils Relative: 1 %
Eosinophils Absolute: 0.1 10*3/uL (ref 0.0–0.5)
Eosinophils Relative: 2 %
HCT: 33.3 % — ABNORMAL LOW (ref 36.0–46.0)
Hemoglobin: 10.7 g/dL — ABNORMAL LOW (ref 12.0–15.0)
Immature Granulocytes: 0 %
Lymphocytes Relative: 14 %
Lymphs Abs: 0.7 10*3/uL (ref 0.7–4.0)
MCH: 29.3 pg (ref 26.0–34.0)
MCHC: 32.1 g/dL (ref 30.0–36.0)
MCV: 91.2 fL (ref 80.0–100.0)
Monocytes Absolute: 0.6 10*3/uL (ref 0.1–1.0)
Monocytes Relative: 11 %
Neutro Abs: 3.8 10*3/uL (ref 1.7–7.7)
Neutrophils Relative %: 72 %
Platelets: 289 10*3/uL (ref 150–400)
RBC: 3.65 MIL/uL — ABNORMAL LOW (ref 3.87–5.11)
RDW: 17.2 % — ABNORMAL HIGH (ref 11.5–15.5)
WBC: 5.2 10*3/uL (ref 4.0–10.5)
nRBC: 0 % (ref 0.0–0.2)

## 2021-02-25 LAB — COMPREHENSIVE METABOLIC PANEL
ALT: 18 U/L (ref 0–44)
AST: 17 U/L (ref 15–41)
Albumin: 3.3 g/dL — ABNORMAL LOW (ref 3.5–5.0)
Alkaline Phosphatase: 130 U/L — ABNORMAL HIGH (ref 38–126)
Anion gap: 13 (ref 5–15)
BUN: 14 mg/dL (ref 6–20)
CO2: 27 mmol/L (ref 22–32)
Calcium: 9.2 mg/dL (ref 8.9–10.3)
Chloride: 102 mmol/L (ref 98–111)
Creatinine, Ser: 0.86 mg/dL (ref 0.44–1.00)
GFR, Estimated: >60 mL/min (ref >60)
Glucose, Bld: 103 mg/dL — ABNORMAL HIGH (ref 70–99)
Potassium: 3.2 mmol/L — ABNORMAL LOW (ref 3.5–5.1)
Sodium: 142 mmol/L (ref 135–145)
Total Bilirubin: 0.3 mg/dL (ref 0.3–1.2)
Total Protein: 6.9 g/dL (ref 6.5–8.1)

## 2021-02-25 MED ORDER — SODIUM CHLORIDE 0.9 % IV SOLN: Freq: Once | INTRAVENOUS | Status: AC

## 2021-02-25 MED ORDER — FAMOTIDINE 20 MG IN NS 100 ML IVPB
20.0000 mg | Freq: Once | INTRAVENOUS | Status: AC
  Administered 2021-02-25: 20 mg via INTRAVENOUS
  Filled 2021-02-25: qty 100

## 2021-02-25 MED ORDER — SODIUM CHLORIDE 0.9 % IV SOLN: 8.0000 mg | Freq: Once | INTRAVENOUS | Status: DC

## 2021-02-25 MED ORDER — DEXAMETHASONE SODIUM PHOSPHATE 10 MG/ML IJ SOLN
8.0000 mg | Freq: Once | INTRAMUSCULAR | Status: AC
  Administered 2021-02-25: 8 mg via INTRAVENOUS
  Filled 2021-02-25: qty 1

## 2021-02-25 MED ORDER — PACLITAXEL CHEMO INJECTION 300 MG/50ML
80.0000 mg/m2 | Freq: Once | INTRAVENOUS | Status: AC
  Administered 2021-02-25: 192 mg via INTRAVENOUS
  Filled 2021-02-25: qty 32

## 2021-02-25 MED ORDER — HEPARIN SOD (PORK) LOCK FLUSH 100 UNIT/ML IV SOLN: 500.0000 [IU] | Freq: Once | INTRAVENOUS | Status: DC | PRN

## 2021-02-25 MED ORDER — SODIUM CHLORIDE 0.9% FLUSH: 10.0000 mL | INTRAVENOUS | Status: DC | PRN

## 2021-02-25 MED ORDER — SODIUM CHLORIDE 0.9% FLUSH
10.0000 mL | INTRAVENOUS | Status: AC | PRN
  Administered 2021-02-25: 10 mL

## 2021-02-25 MED ORDER — DIPHENHYDRAMINE HCL 50 MG/ML IJ SOLN
25.0000 mg | Freq: Once | INTRAMUSCULAR | Status: AC
Start: 2021-02-25 — End: 2021-02-25
  Administered 2021-02-25: 25 mg via INTRAVENOUS
  Filled 2021-02-25: qty 1

## 2021-02-25 NOTE — Progress Notes (Signed)
[place]  [phone]) [phone] [phone]) [phone]    ID: Debbie [name] DOB: 1965-06-11  MR#: [id number]  NAT#:[id number]  Patient Care Team: Kelton Pillar, MD as PCP - General (Family Medicine) Jacelyn Pi, MD as Consulting Physician (Endocrinology) Jovita Kussmaul, MD as Consulting Physician (General Surgery) [name], Virgie Dad, MD as Consulting Physician (Oncology) [name], MD as Attending Physician (Radiation Oncology) Belva Crome, MD as Consulting Physician (Cardiology) Crissie Reese, MD as Consulting Physician (Plastic Surgery) [name], [name], NP as Nurse Practitioner (Hematology and Oncology) Lajoyce Lauber, DMD (Dentistry) Raina Mina, RPH-CPP (Pharmacist) OTHER MD:   CHIEF COMPLAINT: Estrogen receptor positive stage IV  breast cancer (s/p bilateral mastectomies)  CURRENT TREATMENT: paclitaxel; denosumab/Xgeva   INTERVAL HISTORY: Debbie [name] returns today for follow up and treatment of her stage IV breast cancer.  [name] most recent restaging was completed with chest CT and MRI abdomen on 02/07/2021.  The chest CT showed: slight increased conspicuity of small pulmonary nodules in lungs, possibility of metastatic disease no excluded; persistent right hilar and subcarinal lymphadenopathy, concerning for metastatic disease; widespread metastatic disease to bones, similar to prior study in 11/2020.  The MRI showed: significant decrease in hepatic metastatic disease; no significant change in bone metastases; no new or progressive metastatic disease.  She was started on paclitaxel on 09/10/2020, initially 3 weeks on 1 week off.. She now receives treatments days 1 and 15 of each 28-day cycle.  She is tolerating this generally well.  There has been no progression in her peripheral neuropathy which somehow centers on the third toe of the left foot.  She says this is intermittent.  She denies peripheral neuropathy in her  fingertips.  She continues on Xgeva every 4 weeks, with her most recent dose on 01/14/2021.  She has no side effects from this that she is aware of.  She denies any dental or oral concerns today.    She denies any new pain.  She has chronic back pain that sometimes limits her ability to walk, and she notes that though she eats smaller portions she has difficulty with her weight.  She denies any new concerns today and a detailed ROS is noted below.   We are following her CA 27-29: Lab Results  Component Value Date   CA2729 135.4 (H) 02/11/2021   CA2729 142.6 (H) 01/28/2021   CA2729 115.1 (H) 01/14/2021   CA2729 100.9 (H) 12/31/2020   CA2729 110.3 (H) 12/17/2020    REVIEW OF SYSTEMS: Review of Systems  Constitutional:  Positive for fatigue (mild and chronic, still working full time). Negative for appetite change, chills, fever and unexpected weight change.  HENT:   Negative for hearing loss, lump/mass and trouble swallowing.   Eyes:  Negative for eye problems and icterus.  Respiratory:  Negative for chest tightness, cough and shortness of breath.   Cardiovascular:  Negative for chest pain, leg swelling and palpitations.  Gastrointestinal:  Negative for abdominal distention, abdominal pain, constipation, diarrhea, nausea and vomiting.  Endocrine: Negative for hot flashes.  Genitourinary:  Negative for difficulty urinating.   Musculoskeletal:  Positive for back pain (chronic and per interval history). Negative for arthralgias.  Skin:  Negative for itching and rash.  Neurological:  Negative for dizziness, extremity weakness, headaches and numbness.  Hematological:  Negative for adenopathy. Does not bruise/bleed easily.  Psychiatric/Behavioral:  Negative for depression. The patient is not nervous/anxious.      COVID 19 VACCINATION STATUS: Status post [employer], with booster January  2021   BREAST CANCER HISTORY: From the original intake note:  Shantal had screening mammography with  tomosynthesis at the [facility] 10/02/2015. This showed 2 possible masses in the right breast as well as some suspicious calcifications. The left breast was unremarkable. On 10/15/2015 she underwent right diagnostic mammography with tomography and right breast ultrasonography. The breast density was category B. Spot magnification views confirmed an area of pleomorphic calcifications measuring up to 5 cm in the upper half of the breast. Also in the upper outer right breast there were adjacent low-density masses without distortion or calcifications. On physical exam there was no palpable abnormality.   Targeted ultrasound of the right breast found the 2 "masses" in the right upper quadrant were benign cysts measuring 0.8 and 0.5 cm respectively. Biopsy of the area of calcification on 10/15/2015 showed ([id number]) invasive ductal carcinoma, E-cadherin positive, grade 2, estrogen receptor 90% positive, progesterone receptor 50% positive, both with strong staining intensity, with an MIB-1 of 70%, and no HER-2 amplification, the signals ratio being 1.18 and the number per cell 2.60.   Her subsequent history is as detailed below    PAST MEDICAL HISTORY: Past Medical History:  Diagnosis Date   Anxiety    Arthritis    Bone metastasis ([place])    Breast cancer of upper-inner quadrant of right female breast [facility]) oncologist-  dr [name]--- 04/ 2019 ER/PR+ Stage IV w/ metastatic disease to liver, total spine, lung, bone   dx 06/ 2017  right breast invasive ductal carcinoma, Stage IIB, ER/PR + (pT2 pN1), grade 1-- s/p  right breast lumpecotmy w/ sln bx's and bilateral breast reduction 11-28-2015/  left breast with reduction , dx invasive ductal ca, Grade 1 (pT1cpNX) ER/PR positive/  adjuvant radiation completed 03-27-2016 to right chest wall, 2018  reclassifiec Stage IIA   Cancer, metastatic to liver ([place])    Chronic back pain    Constipation    Depression    Family history of adverse reaction to  anesthesia     mother has Copd was kept on vent    Headache    Heart murmur    History of hyperthyroidism    s/p  RAI   History of radiation therapy 02/11/16- 03/27/16   Right Chest Wall 50.4 Gy in 28 fractions, Right Chest Wall Boost 10 Gy in 5 fractions.    Hyperlipidemia    Hypertension    Hypothyroidism, postradioiodine therapy    endocrinoloigst-  dr balan--  2004  s/p  RAI   Menorrhagia 2011   Multiple pulmonary nodules    secondary  metastatic   PONV (postoperative nausea and vomiting)    patch helps    SUI (stress urinary incontinence, female)    Wears contact lenses    Wears dentures    full upper and lower partial    PAST SURGICAL HISTORY: Past Surgical History:  Procedure Laterality Date   BREAST LUMPECTOMY WITH NEEDLE LOCALIZATION AND AXILLARY SENTINEL LYMPH NODE BX Right 11/28/2015   Procedure: RIGHT BREAST LUMPECTOMY WITH NEEDLE LOCALIZATION AND AXILLARY SENTINEL LYMPH NODE BX;  Surgeon: Autumn Messing III, MD;  Location: [place];  Service: General;  Laterality: Right;   BREAST REDUCTION SURGERY Bilateral 11/28/2015   Procedure: MAMMARY REDUCTION  (BREAST) BILATERAL;  Surgeon: Crissie Reese, MD;  Location: [place];  Service: Plastics;  Laterality: Bilateral;   CESAREAN SECTION  01/12/1993   COLONOSCOPY     Dental surgeries     FEMUR IM NAIL Left 01/28/2018   Procedure: INTRAMEDULLARY (  IM) NAIL FEMORAL;  Surgeon: Nicholes Stairs, MD;  Location: [place];  Service: Orthopedics;  Laterality: Left;   HYSTEROSCOPY N/A 09/02/2017   Procedure: HYSTEROSCOPY  to remove IUD;  Surgeon: Eldred Manges, MD;  Location: [place];  Service: Gynecology;  Laterality: N/A;   IR IMAGING GUIDED PORT INSERTION  09/16/2020   Liver biospy     MASTECTOMY     MASTECTOMY W/ SENTINEL NODE BIOPSY Bilateral 12/26/2015   Procedure: BILATERAL MASTECTOMY WITH LEFT SENTINEL LYMPH NODE BIOPSY;  Surgeon: Autumn Messing III, MD;  Location: [place];  Service: General;  Laterality: Bilateral;    Removal of IUD     TUBAL LIGATION Bilateral 1995    FAMILY HISTORY: Family History  Problem Relation Age of Onset   Diabetes Mother    Hypertension Mother    Hypertension Father    Diabetes Maternal Grandmother    Hypertension Sister    Cancer Paternal Grandmother        colon   Colon cancer Paternal Grandmother    Esophageal cancer Neg Hx    Stomach cancer Neg Hx    Rectal cancer Neg Hx   The patient's parents are still living, in their early 42s as of June 2017. The patient has one brother, 2 sisters. On the maternal side there is a history of uterine and prostate cancer. On the paternal side there is a history of colon cancer and possibly uterine cancer. There is no history of breast or ovarian cancer in the family.    GYNECOLOGIC HISTORY:  Patient's last menstrual period was 12/19/2015.  Menarche age 10. She still having regular periods. She is GX P1, first pregnancy age 8. She used oral contraceptives for a few years remotely, with no complications. She is currently on a Mirena IUD and also is status post bilateral tubal ligation.   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 Her husband [name] is a Administrator.  They are both taking appropriate precautions at work.  Their son [name], s a radio DJ.  The patient is expecting her first grandchild May 2023.  She is not a Ambulance person.   ADVANCED DIRECTIVES: In the absence of any documents to the contrary her husband is her healthcare power of attorney   HEALTH MAINTENANCE: Social History   Tobacco Use   Smoking status: Never   Smokeless tobacco: Never  Vaping Use   Vaping Use: Never used  Substance Use Topics   Alcohol use: No   Drug use: No     Colonoscopy: January 2016   PAP: January 2016   Bone density: August 2017 showed a T score of +0.7 normal  Lipid panel:  Allergies  Allergen Reactions   Ace Inhibitors Cough   Atorvastatin Other (See Comments)     Joint pain   Simvastatin Other (See Comments)    Joint pain    Current Outpatient Medications  Medication Sig Dispense Refill   acetaminophen (TYLENOL) 500 MG tablet Take 1,000 mg by mouth every 6 (six) hours as needed.     Calcium-Magnesium 250-125 MG TABS Take 1 tablet by mouth daily. 120 each 4   cetirizine (ZYRTEC) 10 MG tablet Take 10 mg by mouth daily as needed for allergies.      chlorhexidine (PERIDEX) 0.12 % solution Use 5 mLs in the mouth or throat 2 (two) times daily as needed as directed. Do not swallow 473 mL 6   doxycycline (VIBRA-TABS) 100  MG tablet Take 1 tablet (100 mg total) by mouth daily. 30 tablet 2   furosemide (LASIX) 20 MG tablet Take 1 tablet (20 mg total) by mouth daily. 90 tablet 6   levothyroxine (SYNTHROID) 175 MCG tablet Take 1 tablet (175 mcg total) by mouth in the morning on an empty stomach 30 tablet 6   lidocaine-prilocaine (EMLA) cream Apply to affected area once 30 g 3   losartan-hydrochlorothiazide (HYZAAR) 100-25 MG tablet TAKE 1 TABLET BY MOUTH ONCE DAILY 90 tablet 1   Multiple Vitamin (MULTIVITAMIN) tablet Take 1 tablet by mouth daily.     omega-3 acid ethyl esters (LOVAZA) 1 g capsule Take 1 g by mouth 2 (two) times daily.      omeprazole (PRILOSEC) 40 MG capsule Take 1 capsule (40 mg total) by mouth daily. 30 capsule 3   oxyCODONE (OXY IR/ROXICODONE) 5 MG immediate release tablet Take 1 tablet (5 mg total) by mouth every 4 (four) hours as needed for severe pain. 60 tablet 0   potassium chloride SA (KLOR-CON) 20 MEQ tablet Take 2 tablets (40 mEq total) by mouth daily. 90 tablet 4   pregabalin (LYRICA) 100 MG capsule Take 1 capsule (100 mg total) by mouth 3 (three) times daily. 90 capsule 0   prochlorperazine (COMPAZINE) 10 MG tablet Take 1 tablet (10 mg total) by mouth every 6 (six) hours as needed (Nausea or vomiting). 30 tablet 1   rosuvastatin (CRESTOR) 5 MG tablet Take 1 tablet (5 mg total) by mouth daily. 30 tablet 1   sertraline (ZOLOFT) 100 MG  tablet TAKE 1 TABLET BY MOUTH ONCE A DAY 30 tablet 12   valACYclovir (VALTREX) 1000 MG tablet Take 1 tablet (1,000 mg total) by mouth daily. 60 tablet 6   Vitamin D, Cholecalciferol, 1000 units CAPS Take 1 tablet by mouth daily. (Patient taking differently: Take 1,000 Units by mouth daily.) 90 capsule 4   No current facility-administered medications for this visit.    OBJECTIVE:  Vitals:   02/25/21 0822  BP: (!) 177/79  Pulse: 96  Resp: 18  Temp: (!) 97.5 F (36.4 C)  SpO2: 96%     Body mass index is 48.32 kg/m.    ECOG FS:1 - Symptomatic but completely ambulatory  Filed Weights   02/25/21 0822  Weight: 299 lb 6.4 oz (135.8 kg)  GENERAL: Patient is a well appearing female in no acute distress HEENT:  Sclerae anicteric.  Oropharynx clear and moist. No ulcerations or evidence of oropharyngeal candidiasis. Neck is supple.  NODES:  No cervical, supraclavicular, or axillary lymphadenopathy palpated.  BREAST EXAM:  Deferred. LUNGS:  Clear to auscultation bilaterally.  No wheezes or rhonchi. HEART:  Regular rate and rhythm. No murmur appreciated. ABDOMEN:  Soft, nontender.  Positive, normoactive bowel sounds. No organomegaly palpated. MSK:  No focal spinal tenderness to palpation.  EXTREMITIES:  right arm lymphedema in compression sleeve SKIN:  Clear with no obvious rashes or skin changes. No nail dyscrasia. NEURO:  Nonfocal. Well oriented.  Appropriate affect.    LAB RESULTS:  CMP     Component Value Date/Time   NA 142 02/11/2021 0756   NA 140 07/08/2016 0948   K 3.2 (L) 02/11/2021 0756   K 4.1 07/08/2016 0948   CL 101 02/11/2021 0756   CO2 28 02/11/2021 0756   CO2 24 07/08/2016 0948   GLUCOSE 116 (H) 02/11/2021 0756   GLUCOSE 89 07/08/2016 0948   BUN 13 02/11/2021 0756   BUN 11.9 07/08/2016 0948  CREATININE 0.85 02/11/2021 0756   CREATININE 0.84 08/22/2020 1050   CREATININE 0.9 07/08/2016 0948   CALCIUM 9.5 02/11/2021 0756   CALCIUM 10.0 07/08/2016 0948   PROT 6.6  02/11/2021 0756   PROT 7.6 07/08/2016 0948   ALBUMIN 3.3 (L) 02/11/2021 0756   ALBUMIN 3.6 07/08/2016 0948   AST 16 02/11/2021 0756   AST 16 08/22/2020 1050   AST 22 07/08/2016 0948   ALT 17 02/11/2021 0756   ALT 14 08/22/2020 1050   ALT 27 07/08/2016 0948   ALKPHOS 121 02/11/2021 0756   ALKPHOS 115 07/08/2016 0948   BILITOT 0.4 02/11/2021 0756   BILITOT 0.3 08/22/2020 1050   BILITOT 0.35 07/08/2016 0948   GFRNONAA >60 02/11/2021 0756   GFRNONAA >60 08/22/2020 1050   GFRAA >60 01/30/2020 0814   GFRAA >60 05/16/2019 0905    INo results found for: SPEP, UPEP  Lab Results  Component Value Date   WBC 5.2 02/25/2021   NEUTROABS 3.8 02/25/2021   HGB 10.7 (L) 02/25/2021   HCT 33.3 (L) 02/25/2021   MCV 91.2 02/25/2021   PLT 289 02/25/2021      Chemistry      Component Value Date/Time   NA 142 02/11/2021 0756   NA 140 07/08/2016 0948   K 3.2 (L) 02/11/2021 0756   K 4.1 07/08/2016 0948   CL 101 02/11/2021 0756   CO2 28 02/11/2021 0756   CO2 24 07/08/2016 0948   BUN 13 02/11/2021 0756   BUN 11.9 07/08/2016 0948   CREATININE 0.85 02/11/2021 0756   CREATININE 0.84 08/22/2020 1050   CREATININE 0.9 07/08/2016 0948      Component Value Date/Time   CALCIUM 9.5 02/11/2021 0756   CALCIUM 10.0 07/08/2016 0948   ALKPHOS 121 02/11/2021 0756   ALKPHOS 115 07/08/2016 0948   AST 16 02/11/2021 0756   AST 16 08/22/2020 1050   AST 22 07/08/2016 0948   ALT 17 02/11/2021 0756   ALT 14 08/22/2020 1050   ALT 27 07/08/2016 0948   BILITOT 0.4 02/11/2021 0756   BILITOT 0.3 08/22/2020 1050   BILITOT 0.35 07/08/2016 0948       No results found for: LABCA2  No components found for: LABCA125  No results for input(s): INR in the last 168 hours.  Urinalysis No results found for: COLORURINE, APPEARANCEUR, LABSPEC, PHURINE, GLUCOSEU, HGBUR, BILIRUBINUR, KETONESUR, PROTEINUR, UROBILINOGEN, NITRITE, LEUKOCYTESUR   STUDIES: CT Chest W Contrast  Result Date: 02/10/2021 CLINICAL DATA:   55 year old female with history of breast cancer. Evaluate for treatment response. EXAM: CT CHEST WITH CONTRAST TECHNIQUE: Multidetector CT imaging of the chest was performed during intravenous contrast administration. CONTRAST:  7m OMNIPAQUE IOHEXOL 350 MG/ML SOLN COMPARISON:  Chest CT 11/26/2020. FINDINGS: Cardiovascular: Heart size is normal. There is no significant pericardial fluid, thickening or pericardial calcification. Atherosclerotic calcifications in the distal descending thoracic aorta. No definite coronary artery calcifications. Right internal jugular single-lumen porta cath with tip terminating at the superior cavoatrial junction. Mediastinum/Nodes: Enlarged subcarinal lymph node measuring 1.9 cm in short axis (axial image 66 of series 3) and enlarged right hilar lymph node (axial image 69 of series 3) measuring 1.1 cm in short axis. No other pathologically enlarged mediastinal, left hilar or internal mammary lymph nodes. No axillary lymphadenopathy. Lungs/Pleura: Chronic areas of ground-glass attenuation and septal thickening in the apex of the right upper lobe deep to the right axilla, compatible with chronic postradiation changes. Mild subpleural reticulation is also noted in the anterior aspect of the  right upper and middle lobes deep to the right breast, also compatible with postradiation changes. Several small pulmonary nodules are noted in the lungs bilaterally, some of which are slightly more apparent than the prior examination. The best examples of this are two 4 mm pulmonary nodules in the right lower lobe (both on axial image 93 of series 6) which previously measured only 1-2 mm on prior study 11/26/2020. No acute consolidative airspace disease. No pleural effusions. Upper Abdomen: Unremarkable. Musculoskeletal: Status post bilateral modified radical mastectomy and right axillary lymph node dissection. Diffuse mixed lytic and sclerotic lesions are noted throughout all aspects of the  visualized axial and appendicular skeleton, compatible with widespread metastatic disease to the bones, very similar to the prior study. IMPRESSION: 1. Slight increased conspicuity of small pulmonary nodules in the lungs. The possibility of metastatic disease is not excluded, and close attention on follow-up studies is recommended. 2. Persistent right hilar and subcarinal lymphadenopathy, concerning for metastatic disease. 3. Widespread metastatic disease to the bones, similar to the prior study. 4. Aortic atherosclerosis. Aortic Atherosclerosis (ICD10-I70.0). Electronically Signed   By: Vinnie Langton M.D.   On: 02/10/2021 09:21   MR Abdomen W Wo Contrast  Result Date: 02/09/2021 CLINICAL DATA:  Follow-up metastatic breast carcinoma. EXAM: MRI ABDOMEN WITHOUT AND WITH CONTRAST TECHNIQUE: Multiplanar multisequence MR imaging of the abdomen was performed both before and after the administration of intravenous contrast. CONTRAST:  47m GADAVIST GADOBUTROL 1 MMOL/ML IV SOLN COMPARISON:  08/23/2020 FINDINGS: Lower chest: No acute findings. Hepatobiliary: Diffuse hepatic metastases show significant decrease in size compared to prior study. Index lesion in the caudate lobe currently measures 1.5 x 1.5 cm on image 14/6, compared to 2.8 x 2.7 cm previously. Another index lesion in the inferior right hepatic lobe measures 1.8 x 1.5 cm on image 26/6, compared to 2.3 x 2.2 cm previously. Gallbladder is unremarkable. No evidence of biliary ductal dilatation. Pancreas:  No mass or inflammatory changes. Spleen:  Within normal limits in size and appearance. Adrenals/Urinary Tract: No adrenal or renal masses identified. Small left renal sinus cysts remains stable. No evidence of hydronephrosis. Stomach/Bowel: Visualized portion unremarkable. Vascular/Lymphatic: No pathologically enlarged lymph nodes identified. No acute vascular findings. Other:  None. Musculoskeletal: Diffuse bone metastases throughout the visualized portion  spine show no significant change. IMPRESSION: Significant decrease in hepatic metastatic disease since prior study. No significant change in bone metastases. No new or progressive metastatic disease identified within the abdomen. Electronically Signed   By: JMarlaine HindM.D.   On: 02/09/2021 22:23     ELIGIBLE FOR AVAILABLE RESEARCH PROTOCOL: PALLAS, but inelegible because of Mirena IUD   ASSESSMENT: 55y.o. [place] woman status post right breast upper inner quadrant biopsy 10/15/2015 for a clinical T2-T3 N0, stage II invasive ductal carcinoma, estrogen and progesterone receptor positive, HER-2 nonamplified, with an Mib-1 of 70%  (1) status post right lumpectomy and sentinel lymph node sampling with oncoplastic breast reduction 11/28/2015 for a pT2 pN1, stage IIB invasive ductal carcinoma, grade 1, with negative margins (1/1 sentinel node positive)  (a) reclassified as stage IIA in the 2018 prognostic revision   (2) left reduction mammoplasty 11/28/2015 unexpectedly found a pT1c pNX invasive ductal carcinoma, grade 1, estrogen and progesterone receptor positive, HER-2 not amplified, with an MIB-1 of 3%  (3) Mammaprint from the right sided breast cancer was "low risk", predicting a 98% chance of no disease recurrence within 5 years with anti-estrogens as the only systemic therapy. It also predicts minimal to no  benefit from chemotherapy  (4) status post bilateral mastectomies with bilateral sentinel lymph node sampling 12/26/2015, showing  (a) on the right, no residual carcinoma (0/3 nodes involved)  (b) on the left, focal ductal carcinoma in situ, 0.2 cm, with negative margins; (0/4 nodes involved)  (5) adjuvant radiation 02/11/16 - 03/27/16    1) Right chest wall: 50.4 Gy in 28 fractions               2) Right chest wall boost: 10 Gy in 5 fractions   (6) started on tamoxifen neoadjuvantly 10/23/2015 to allow for expected delays in definitive local treatment  (a) Mirena IUD in place  (b)  tamoxifen discontinued 08/13/2017, with progression  (7) since she had 4 lymph nodes removed from each axilla but also receive radiation on the right, lab draws should preferentially be obtained from the left arm   METASTATIC DISEASE: April 2019 (8) CT scan of the abdomen and pelvis 08/04/2017, MRI of the liver and total spine MRI 08/11/2017 showed liver and bone metastases  (a) liver biopsy 09/07/2017 confirms estrogen receptor positive, progesterone receptor negative, HER-2 negative metastatic breast cancer  (b) chest CT scan 08/13/2017 shows multiple bilateral pulmonary nodules.  (c) CT of the brain with and without contrast 08/26/2017 shows no evidence of metastatic disease to the brain  (d) CA 27-29 is informative  (9) fulvestrant started 08/17/2017  (a) palbociclib started 08/31/2017  (b) fulvestrant and palbociclib discontinued 01/04/2018 with evidence of progression in the liver  (10) denosumab/Xgeva started 09/14/2017  (a) held after 03/01/2018 dose because concern re. osteonecrosis  (b) resumed 06/09/2018  (c) 02/21/2019 dose held due to oral surgery, resumed 03/21/2019  (11) anastrozole started 01/04/2018  (a) everolimus started 01/29/2018  (b) liver MRI and bone scan December 2019 show stable disease  (c) chest CT scan on 08/02/2018 shows lung lesions resolved, bone lesions stable  (d) bone scan 09/13/2018 essentially stable  (e) liver MRI 12/08/2018 shows improvement in hepatic metastsases  (f) liver MRI and bone scan 04/13/2019 showed continuing improvement in the liver, stable bone lesions  (g) chest CT 08/01/2019 stable, no evidence of active disease ows  (12) 01/28/18 prophylactic left femur intramedullary nail surgery for impending left femur pathologic fracture  (a) palliative radiation with Dr. [name] to the left femur and left ilium from 02/16/2018 to 03/01/2018  (13) [facility] report on liver biopsy from April 2019 shows a negative PDL 1, stable MSI and proficient  mismatch repair status.  The tumor is positive for the androgen receptor, and for PTEN for PIK3 CA mutations.  HER-2/neu was read as equivocal (it was negative by FISH on the pathology report).  (14) Progression noted in liver MRI on 01/11/2020; fulvestrant started on 01/30/2020  (a) alpelisib started 01/30/2020 at 300 mg daily with Metformin 500 mg daily  (b) alpelisib held on 02/07/2020 with very high blood sugars.  Metformin increased to 1 g daily  (c) alpelisib resumed at 150 mg daily on 02/13/2020  (d) liver MRI 05/06/2020 shows evidence of disease progression  (e) fulvestrant and alpelisib discontinued January 2022  (15) capecitabine started 05/22/2020 at 1.5 g twice daily, 14/7  (a) abdominal MRI 08/23/2020 shows worsening liver disease  (b) chest CT scan 09/04/2020 shows new lung metastases  (c) capecitabine discontinued April 2022  (16) paclitaxel started 09/10/2020, repeated days 1, 8, and 15 of each 28-day cycle  (A) changed to every 2 weeks beginning with cycle 3 (11/05/2020)  (B) MRI of the abdomen 02/09/2021 shows evidence  of response  (C) chest CT 02/10/2021 shows stable disease   PLAN: Kashmir is here today for follow up of her metastatic breast cancer.  She will continue on treatment with Paclitaxel every two weeks.  She has no clinical or radiographic sign of progression of her cancer.  Her peripheral neuropathy in her left foot remains stable and unchanged.  We will continue to monitor this closely.  Debbie [name] and I talked about her weight, getting more steps, and working on a balance of activity considering her chronic back issues.    Debbie [name] asked me about her new oncologist once Dr. Dixie Dials who will be Dr. Lindi Adie.  She met him today so that she could put a face with a name.    Debbie [name] would like to receive the flu shot and COVID booster.  I made an appointment for this later this week.  We will see Debbie [name] in 2 weeks for labs, f/u and her next treatment.  She  knows to call for any questions that may arise between now and her next appointment.  We are happy to see her sooner if needed.   Total encounter time 30 minutes.Wilber Bihari, NP 02/25/21 8:25 AM Medical Oncology and Hematology [facility] [place], [place] [address] Tel. [phone]    Fax. [phone]    *Total Encounter Time as defined by the Centers for Medicare and Medicaid Services includes, in addition to the face-to-face time of a patient visit (documented in the note above) non-face-to-face time: obtaining and reviewing outside history, ordering and reviewing medications, tests or procedures, care coordination (communications with other health care professionals or caregivers) and documentation in the medical record.

## 2021-02-26 LAB — CANCER ANTIGEN 27.29: CA 27.29: 176.4 U/mL — ABNORMAL HIGH (ref 0.0–38.6)

## 2021-02-27 ENCOUNTER — Encounter: Payer: Self-pay | Admitting: Rehabilitation

## 2021-02-27 ENCOUNTER — Other Ambulatory Visit: Payer: Self-pay

## 2021-02-27 ENCOUNTER — Other Ambulatory Visit (HOSPITAL_COMMUNITY): Payer: Self-pay

## 2021-02-27 ENCOUNTER — Inpatient Hospital Stay (HOSPITAL_BASED_OUTPATIENT_CLINIC_OR_DEPARTMENT_OTHER): Payer: 59

## 2021-02-27 ENCOUNTER — Inpatient Hospital Stay: Payer: 59

## 2021-02-27 ENCOUNTER — Ambulatory Visit: Payer: 59 | Admitting: Rehabilitation

## 2021-02-27 DIAGNOSIS — G8929 Other chronic pain: Secondary | ICD-10-CM

## 2021-02-27 DIAGNOSIS — M6281 Muscle weakness (generalized): Secondary | ICD-10-CM

## 2021-02-27 DIAGNOSIS — C50919 Malignant neoplasm of unspecified site of unspecified female breast: Secondary | ICD-10-CM | POA: Diagnosis not present

## 2021-02-27 DIAGNOSIS — R293 Abnormal posture: Secondary | ICD-10-CM

## 2021-02-27 DIAGNOSIS — M545 Low back pain, unspecified: Secondary | ICD-10-CM

## 2021-02-27 DIAGNOSIS — Z23 Encounter for immunization: Secondary | ICD-10-CM | POA: Diagnosis not present

## 2021-02-27 MED ORDER — INFLUENZA VAC SPLIT QUAD 0.5 ML IM SUSY
0.5000 mL | PREFILLED_SYRINGE | Freq: Once | INTRAMUSCULAR | Status: AC
  Administered 2021-02-27: 0.5 mL via INTRAMUSCULAR
  Filled 2021-02-27: qty 0.5

## 2021-02-27 NOTE — Patient Instructions (Signed)
Influenza, Adult °Influenza, also called "the flu," is a viral infection that mainly affects the respiratory tract. This includes the lungs, nose, and throat. The flu spreads easily from person to person (is contagious). It causes common cold symptoms, along with high fever and body aches. °What are the causes? °This condition is caused by the influenza virus. You can get the virus by: °Breathing in droplets that are in the air from an infected person's cough or sneeze. °Touching something that has the virus on it (has been contaminated) and then touching your mouth, nose, or eyes. °What increases the risk? °The following factors may make you more likely to get the flu: °Not washing or sanitizing your hands often. °Having close contact with many people during cold and flu season. °Touching your mouth, eyes, or nose without first washing or sanitizing your hands. °Not getting an annual flu shot. °You may have a higher risk for the flu, including serious problems, such as a lung infection (pneumonia), if you: °Are older than 65. °Are pregnant. °Have a weakened disease-fighting system (immune system). This includes people who have HIV or AIDS, are on chemotherapy, or are taking medicines that reduce (suppress) the immune system. °Have a long-term (chronic) illness, such as heart disease, kidney disease, diabetes, or lung disease. °Have a liver disorder. °Are severely overweight (morbidly obese). °Have anemia. °Have asthma. °What are the signs or symptoms? °Symptoms of this condition usually begin suddenly and last 4-14 days. These may include: °Fever and chills. °Headaches, body aches, or muscle aches. °Sore throat. °Cough. °Runny or stuffy (congested) nose. °Chest discomfort. °Poor appetite. °Weakness or fatigue. °Dizziness. °Nausea or vomiting. °How is this diagnosed? °This condition may be diagnosed based on: °Your symptoms and medical history. °A physical exam. °Swabbing your nose or throat and testing the fluid  for the influenza virus. °How is this treated? °If the flu is diagnosed early, you can be treated with antiviral medicine that is given by mouth (orally) or through an IV. This can help reduce how severe the illness is and how long it lasts. °Taking care of yourself at home can help relieve symptoms. Your health care provider may recommend: °Taking over-the-counter medicines. °Drinking plenty of fluids. °In many cases, the flu goes away on its own. If you have severe symptoms or complications, you may be treated in a hospital. °Follow these instructions at home: °Activity °Rest as needed and get plenty of sleep. °Stay home from work or school as told by your health care provider. Unless you are visiting your health care provider, avoid leaving home until your fever has been gone for 24 hours without taking medicine. °Eating and drinking °Take an oral rehydration solution (ORS). This is a drink that is sold at pharmacies and retail stores. °Drink enough fluid to keep your urine pale yellow. °Drink clear fluids in small amounts as you are able. Clear fluids include water, ice chips, fruit juice mixed with water, and low-calorie sports drinks. °Eat bland, easy-to-digest foods in small amounts as you are able. These foods include bananas, applesauce, rice, lean meats, toast, and crackers. °Avoid drinking fluids that contain a lot of sugar or caffeine, such as energy drinks, regular sports drinks, and soda. °Avoid alcohol. °Avoid spicy or fatty foods. °General instructions °  °Take over-the-counter and prescription medicines only as told by your health care provider. °Use a cool mist humidifier to add humidity to the air in your home. This can make it easier to breathe. °When using a cool mist humidifier,   clean it daily. Empty the water and replace it with clean water. °Cover your mouth and nose when you cough or sneeze. °Wash your hands with soap and water often and for at least 20 seconds, especially after you cough or  sneeze. If soap and water are not available, use alcohol-based hand sanitizer. °Keep all follow-up visits. This is important. °How is this prevented? ° °Get an annual flu shot. This is usually available in late summer, fall, or winter. Ask your health care provider when you should get your flu shot. °Avoid contact with people who are sick during cold and flu season. This is generally fall and winter. °Contact a health care provider if: °You develop new symptoms. °You have: °Chest pain. °Diarrhea. °A fever. °Your cough gets worse. °You produce more mucus. °You feel nauseous or you vomit. °Get help right away if you: °Develop shortness of breath or have difficulty breathing. °Have skin or nails that turn a bluish color. °Have severe pain or stiffness in your neck. °Develop a sudden headache or sudden pain in your face or ear. °Cannot eat or drink without vomiting. °These symptoms may represent a serious problem that is an emergency. Do not wait to see if the symptoms will go away. Get medical help right away. Call your local emergency services (911 in the [place] Do not drive yourself to the hospital. °Summary °Influenza, also called "the flu," is a viral infection that primarily affects your respiratory tract. °Symptoms of the flu usually begin suddenly and last 4-14 days. °Getting an annual flu shot is the best way to prevent getting the flu. °Stay home from work or school as told by your health care provider. Unless you are visiting your health care provider, avoid leaving home until your fever has been gone for 24 hours without taking medicine. °Keep all follow-up visits. This is important. °This information is not intended to replace advice given to you by your health care provider. Make sure you discuss any questions you have with your health care provider. °Document Revised: 12/15/2019 Document Reviewed: 12/15/2019 °Elsevier Patient Education © 2022 [employer] Inc. ° °

## 2021-02-27 NOTE — Therapy (Signed)
[facility] @ [place], [place], [address] Phone: [phone]   Fax:  [phone]  Physical Therapy Treatment  Patient Details  Name: Debbie [name] MRN: [id number] Date of Birth: [birth year] Referring Provider (PT): Dr. [name]   Encounter Date: 02/27/2021   PT End of Session - 02/27/21 0842     Visit Number 11    Number of Visits 17    Date for PT Re-Evaluation 04/10/21    PT Start Time 0800    PT Stop Time 6073    PT Time Calculation (min) 44 min    Activity Tolerance Patient tolerated treatment well    Behavior During Therapy [facility] for tasks assessed/performed             Past Medical History:  Diagnosis Date   Anxiety    Arthritis    Bone metastasis ([place])    Breast cancer of upper-inner quadrant of right female breast [facility]) oncologist-  dr [name]--- 04/ 2019 ER/PR+ Stage IV w/ metastatic disease to liver, total spine, lung, bone   dx 06/ 2017  right breast invasive ductal carcinoma, Stage IIB, ER/PR + (pT2 pN1), grade 1-- s/p  right breast lumpecotmy w/ sln bx's and bilateral breast reduction 11-28-2015/  left breast with reduction , dx invasive ductal ca, Grade 1 (pT1cpNX) ER/PR positive/  adjuvant radiation completed 03-27-2016 to right chest wall, 2018  reclassifiec Stage IIA   Cancer, metastatic to liver ([place])    Chronic back pain    Constipation    Depression    Family history of adverse reaction to anesthesia     mother has Copd was kept on vent    Headache    Heart murmur    History of hyperthyroidism    s/p  RAI   History of radiation therapy 02/11/16- 03/27/16   Right Chest Wall 50.4 Gy in 28 fractions, Right Chest Wall Boost 10 Gy in 5 fractions.    Hyperlipidemia    Hypertension    Hypothyroidism, postradioiodine therapy    endocrinoloigst-  dr balan--  2004  s/p  RAI   Menorrhagia 2011   Multiple pulmonary nodules    secondary  metastatic   PONV (postoperative nausea  and vomiting)    patch helps    SUI (stress urinary incontinence, female)    Wears contact lenses    Wears dentures    full upper and lower partial    Past Surgical History:  Procedure Laterality Date   BREAST LUMPECTOMY WITH NEEDLE LOCALIZATION AND AXILLARY SENTINEL LYMPH NODE BX Right 11/28/2015   Procedure: RIGHT BREAST LUMPECTOMY WITH NEEDLE LOCALIZATION AND AXILLARY SENTINEL LYMPH NODE BX;  Surgeon: Autumn Messing III, MD;  Location: [facility];  Service: General;  Laterality: Right;   BREAST REDUCTION SURGERY Bilateral 11/28/2015   Procedure: MAMMARY REDUCTION  (BREAST) BILATERAL;  Surgeon: Crissie Reese, MD;  Location: [facility];  Service: Plastics;  Laterality: Bilateral;   CESAREAN SECTION  01/12/1993   COLONOSCOPY     Dental surgeries     FEMUR IM NAIL Left 01/28/2018   Procedure: INTRAMEDULLARY (IM) NAIL FEMORAL;  Surgeon: Nicholes Stairs, MD;  Location: [facility];  Service: Orthopedics;  Laterality: Left;   HYSTEROSCOPY N/A 09/02/2017   Procedure: HYSTEROSCOPY  to remove IUD;  Surgeon: Eldred Manges, MD;  Location: [facility];  Service: Gynecology;  Laterality: N/A;   IR IMAGING GUIDED PORT INSERTION  09/16/2020   Liver biospy  MASTECTOMY     MASTECTOMY W/ SENTINEL NODE BIOPSY Bilateral 12/26/2015   Procedure: BILATERAL MASTECTOMY WITH LEFT SENTINEL LYMPH NODE BIOPSY;  Surgeon: Autumn Messing III, MD;  Location: [facility];  Service: General;  Laterality: Bilateral;   Removal of IUD     TUBAL LIGATION Bilateral 1995    There were no vitals filed for this visit.   Subjective Assessment - 02/27/21 0804     Subjective just sore today.  Pool is going well    Pertinent History Stage IV breast Cancer s/p Bilateral Mastectomies with METS to Liver, spine, lung, Bone.  Has left  Femur IM rod.  Presently on Paclitaxel Day 1 and 15 of 28 day cycle.  Stable Right UE lymphedema. 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 Yes    Pain Score 2     Pain Location Back     Pain Orientation Lower;Mid    Pain Descriptors / Indicators Aching    Pain Type Chronic pain                       Flowsheet Row Outpatient [facility]  Lymphedema Life Impact Scale Total Score 11.76 %             [facility] Adult PT Treatment/Exercise - 02/27/21 0001       Neuro Re-ed    Neuro Re-ed Details  TrA activation in supine 5" x 10, TrA activated for the following: heel slides 2x8, ball squeeze x 10, fall outs x 8 bil, mini bridge with pain, seated slow march x 10 bil, LAQ x 5 x 5" bil,  In // bars: foam EC 20"x3, tandem stance 2x10" with some increasing back pain up to 4/10 pt reporting less than she gets just walking into the store.      Lumbar Exercises: Aerobic   Nustep Level 1 for 26min with no increase in pain - LEs only      Knee/Hip Exercises: Standing   Other Standing Knee Exercises in parallel bars: hip abduction, extension x 10 bil, heel raises x 10bil, hamstring curls x 10 bil                          PT Long Term Goals - 02/13/21 1407       PT LONG TERM GOAL #1   Title Pt will be independent with HEP for lumbar, core and generalized strength    Time 6    Period Weeks    Status On-going      PT LONG TERM GOAL #2   Title Pt will have LBP decreased by atleast 50%    Baseline 02/13/21- 50% improvement    Time 6    Period Weeks    Status Achieved      PT LONG TERM GOAL #3   Title pt will be able to shower without sitting on shower chair    Baseline 02/13/21- pt still requires a shower chair because at the end of the day her back hurts    Time 6    Period Weeks    Status On-going      PT LONG TERM GOAL #4   Title pt will be able to wash dishes for 10-20 minutes with minimal complaints of LBP    Baseline 02/13/21- pt reports this has reamained about the same, she needs to lean on the sink  Time 6    Period Weeks    Status On-going      PT LONG TERM GOAL #5    Title pt will be able to rise from chair without pain or stiffness    Baseline 02/13/21- pt is able to do this    Time 6    Period Weeks    Status Achieved                   Plan - 02/27/21 0843     Clinical Impression Statement Pt still doing well.  Able to do 58min of nustep today and add some balance exercises.  Some increased back pain with increased level balance but pt reports less than normal increase with walking.    PT Frequency 1x / week    PT Duration 8 weeks    PT Treatment/Interventions Aquatic Therapy;ADLs/Self Care Home Management;Therapeutic exercise;Neuromuscular re-education;Manual techniques;Patient/family education;Therapeutic activities    PT Next Visit Plan advance core strengthening and aerboic capacity challenges, continue improving core strength    Consulted and Agree with Plan of Care Patient             Patient will benefit from skilled therapeutic intervention in order to improve the following deficits and impairments:     Visit Diagnosis: Chronic bilateral low back pain without sciatica  Abnormal posture  Muscle weakness (generalized)     Problem List Patient Active Problem List   Diagnosis Date Noted   Encounter for antineoplastic chemotherapy 10/08/2020   Lower extremity edema 10/08/2020   Lung metastases ([place]) 09/10/2020   Goals of care, counseling/discussion 12/27/2018   Osteonecrosis ([place]) 11/01/2018   Pathological fracture in neoplastic disease, left femur, initial encounter for fracture ([place]) 01/28/2018   Morbid obesity with BMI of 40.0-44.9, adult ([place]) 90/30/0923   IUD complication ([place]) 30/11/6224   Bone metastases ([place]) 08/13/2017   Pain from bone metastases ([place]) 08/13/2017   Liver metastases ([place]) 08/13/2017   Steatosis of liver 08/13/2017   Bilateral malignant neoplasm of breast in female, estrogen receptor positive ([place]) 12/26/2015   Malignant neoplasm of central portion of left breast in female, estrogen  receptor positive ([place]) 11/28/2015   Malignant neoplasm of upper-inner quadrant of right breast in female, estrogen receptor positive ([place]) 10/17/2015   Hypertension 04/19/2012   Hypothyroid 04/19/2012   Fibroids 04/19/2012   Stress incontinence, female 04/19/2012    Stark Bray, PT 02/27/2021, 8:46 AM  [facility] @ [place], [place], [address] Phone: [phone]   Fax:  [phone]  Name: Debbie [name] MRN: [id number] Date of Birth: 07/31/1965

## 2021-03-06 ENCOUNTER — Other Ambulatory Visit (HOSPITAL_COMMUNITY): Payer: Self-pay

## 2021-03-06 ENCOUNTER — Other Ambulatory Visit: Payer: Self-pay | Admitting: Oncology

## 2021-03-06 MED ORDER — PREGABALIN 100 MG PO CAPS
100.0000 mg | ORAL_CAPSULE | Freq: Three times a day (TID) | ORAL | 0 refills | Status: DC
  Filled 2021-03-06: qty 90, 30d supply, fill #0

## 2021-03-07 ENCOUNTER — Other Ambulatory Visit (HOSPITAL_COMMUNITY): Payer: Self-pay

## 2021-03-10 ENCOUNTER — Other Ambulatory Visit (HOSPITAL_COMMUNITY): Payer: Self-pay

## 2021-03-10 MED ORDER — LOSARTAN POTASSIUM-HCTZ 100-25 MG PO TABS
ORAL_TABLET | ORAL | 2 refills | Status: DC
  Filled 2021-03-10: qty 90, 90d supply, fill #0
  Filled 2021-06-16: qty 90, 90d supply, fill #1
  Filled 2021-09-18: qty 90, 90d supply, fill #2

## 2021-03-10 MED FILL — Sertraline HCl Tab 100 MG: ORAL | 30 days supply | Qty: 30 | Fill #6 | Status: AC

## 2021-03-10 MED FILL — Dexamethasone Sodium Phosphate Inj 100 MG/10ML: INTRAMUSCULAR | Qty: 1 | Status: AC

## 2021-03-11 ENCOUNTER — Other Ambulatory Visit: Payer: Self-pay | Admitting: Adult Health

## 2021-03-11 ENCOUNTER — Inpatient Hospital Stay (HOSPITAL_BASED_OUTPATIENT_CLINIC_OR_DEPARTMENT_OTHER): Payer: 59 | Admitting: Adult Health

## 2021-03-11 ENCOUNTER — Other Ambulatory Visit: Payer: Self-pay

## 2021-03-11 ENCOUNTER — Inpatient Hospital Stay: Payer: 59 | Attending: Oncology

## 2021-03-11 ENCOUNTER — Other Ambulatory Visit (HOSPITAL_COMMUNITY): Payer: Self-pay

## 2021-03-11 ENCOUNTER — Inpatient Hospital Stay: Payer: 59

## 2021-03-11 ENCOUNTER — Encounter: Payer: Self-pay | Admitting: Adult Health

## 2021-03-11 VITALS — BP 148/72 | HR 91 | Temp 97.9°F | Resp 18 | Ht 66.0 in | Wt 297.6 lb

## 2021-03-11 DIAGNOSIS — C7951 Secondary malignant neoplasm of bone: Secondary | ICD-10-CM | POA: Diagnosis not present

## 2021-03-11 DIAGNOSIS — Z9013 Acquired absence of bilateral breasts and nipples: Secondary | ICD-10-CM | POA: Insufficient documentation

## 2021-03-11 DIAGNOSIS — Z95828 Presence of other vascular implants and grafts: Secondary | ICD-10-CM

## 2021-03-11 DIAGNOSIS — Z170 Estrogen receptor positive status [ER+]: Secondary | ICD-10-CM | POA: Insufficient documentation

## 2021-03-11 DIAGNOSIS — Z923 Personal history of irradiation: Secondary | ICD-10-CM | POA: Diagnosis not present

## 2021-03-11 DIAGNOSIS — C50112 Malignant neoplasm of central portion of left female breast: Secondary | ICD-10-CM

## 2021-03-11 DIAGNOSIS — Z79899 Other long term (current) drug therapy: Secondary | ICD-10-CM | POA: Diagnosis not present

## 2021-03-11 DIAGNOSIS — C50211 Malignant neoplasm of upper-inner quadrant of right female breast: Secondary | ICD-10-CM | POA: Diagnosis not present

## 2021-03-11 DIAGNOSIS — C787 Secondary malignant neoplasm of liver and intrahepatic bile duct: Secondary | ICD-10-CM | POA: Diagnosis not present

## 2021-03-11 DIAGNOSIS — C7800 Secondary malignant neoplasm of unspecified lung: Secondary | ICD-10-CM | POA: Diagnosis not present

## 2021-03-11 DIAGNOSIS — Z5111 Encounter for antineoplastic chemotherapy: Secondary | ICD-10-CM | POA: Insufficient documentation

## 2021-03-11 LAB — COMPREHENSIVE METABOLIC PANEL
ALT: 16 U/L (ref 0–44)
AST: 17 U/L (ref 15–41)
Albumin: 3.2 g/dL — ABNORMAL LOW (ref 3.5–5.0)
Alkaline Phosphatase: 126 U/L (ref 38–126)
Anion gap: 9 (ref 5–15)
BUN: 9 mg/dL (ref 6–20)
CO2: 29 mmol/L (ref 22–32)
Calcium: 8.8 mg/dL — ABNORMAL LOW (ref 8.9–10.3)
Chloride: 102 mmol/L (ref 98–111)
Creatinine, Ser: 0.79 mg/dL (ref 0.44–1.00)
GFR, Estimated: >60 mL/min (ref >60)
Glucose, Bld: 117 mg/dL — ABNORMAL HIGH (ref 70–99)
Potassium: 3.5 mmol/L (ref 3.5–5.1)
Sodium: 140 mmol/L (ref 135–145)
Total Bilirubin: 0.4 mg/dL (ref 0.3–1.2)
Total Protein: 6.9 g/dL (ref 6.5–8.1)

## 2021-03-11 LAB — CBC WITH DIFFERENTIAL/PLATELET
Abs Immature Granulocytes: 0.02 10*3/uL (ref 0.00–0.07)
Basophils Absolute: 0 10*3/uL (ref 0.0–0.1)
Basophils Relative: 1 %
Eosinophils Absolute: 0.3 10*3/uL (ref 0.0–0.5)
Eosinophils Relative: 6 %
HCT: 32.4 % — ABNORMAL LOW (ref 36.0–46.0)
Hemoglobin: 10.5 g/dL — ABNORMAL LOW (ref 12.0–15.0)
Immature Granulocytes: 0 %
Lymphocytes Relative: 15 %
Lymphs Abs: 0.7 10*3/uL (ref 0.7–4.0)
MCH: 29.2 pg (ref 26.0–34.0)
MCHC: 32.4 g/dL (ref 30.0–36.0)
MCV: 90.3 fL (ref 80.0–100.0)
Monocytes Absolute: 0.4 10*3/uL (ref 0.1–1.0)
Monocytes Relative: 8 %
Neutro Abs: 3.5 10*3/uL (ref 1.7–7.7)
Neutrophils Relative %: 70 %
Platelets: 263 10*3/uL (ref 150–400)
RBC: 3.59 MIL/uL — ABNORMAL LOW (ref 3.87–5.11)
RDW: 17.8 % — ABNORMAL HIGH (ref 11.5–15.5)
WBC: 4.9 10*3/uL (ref 4.0–10.5)
nRBC: 0 % (ref 0.0–0.2)

## 2021-03-11 MED ORDER — FAMOTIDINE 20 MG IN NS 100 ML IVPB
20.0000 mg | Freq: Once | INTRAVENOUS | Status: AC
  Administered 2021-03-11: 20 mg via INTRAVENOUS
  Filled 2021-03-11: qty 100

## 2021-03-11 MED ORDER — SODIUM CHLORIDE 0.9 % IV SOLN: Freq: Once | INTRAVENOUS | Status: AC

## 2021-03-11 MED ORDER — DIPHENHYDRAMINE HCL 50 MG/ML IJ SOLN
25.0000 mg | Freq: Once | INTRAMUSCULAR | Status: AC
  Administered 2021-03-11: 25 mg via INTRAVENOUS
  Filled 2021-03-11: qty 1

## 2021-03-11 MED ORDER — SODIUM CHLORIDE 0.9 % IV SOLN
10.0000 mg | Freq: Once | INTRAVENOUS | Status: AC
  Administered 2021-03-11: 10 mg via INTRAVENOUS
  Filled 2021-03-11: qty 10

## 2021-03-11 MED ORDER — SODIUM CHLORIDE 0.9 % IV SOLN
80.0000 mg/m2 | Freq: Once | INTRAVENOUS | Status: AC
  Administered 2021-03-11: 192 mg via INTRAVENOUS
  Filled 2021-03-11: qty 32

## 2021-03-11 MED ORDER — SODIUM CHLORIDE 0.9% FLUSH
10.0000 mL | INTRAVENOUS | Status: AC | PRN
  Administered 2021-03-11: 10 mL

## 2021-03-11 MED ORDER — SODIUM CHLORIDE 0.9% FLUSH
10.0000 mL | INTRAVENOUS | Status: DC | PRN
  Administered 2021-03-11: 10 mL

## 2021-03-11 MED ORDER — HEPARIN SOD (PORK) LOCK FLUSH 100 UNIT/ML IV SOLN
500.0000 [IU] | Freq: Once | INTRAVENOUS | Status: AC | PRN
  Administered 2021-03-11: 500 [IU]

## 2021-03-11 MED ORDER — DENOSUMAB 120 MG/1.7ML ~~LOC~~ SOLN
120.0000 mg | Freq: Once | SUBCUTANEOUS | Status: AC
  Administered 2021-03-11: 120 mg via SUBCUTANEOUS
  Filled 2021-03-11: qty 1.7

## 2021-03-11 NOTE — Patient Instructions (Signed)
[facility] ONCOL[facility]   Discharge Instructions: Thank you for choosing [facility] to provide your oncology and hematology care.   If you have a lab appointment with the [facility], please go directly to the [facility] and check in at the registration area.   Wear comfortable clothing and clothing appropriate for easy access to any Portacath or PICC line.   We strive to give you quality time with your provider. You may need to reschedule your appointment if you arrive late (15 or more minutes).  Arriving late affects you and other patients whose appointments are after yours.  Also, if you miss three or more appointments without notifying the office, you may be dismissed from the clinic at the provider's discretion.      For prescription refill requests, have your pharmacy contact our office and allow 72 hours for refills to be completed.    Today you received the following chemotherapy and/or immunotherapy agents: Paclitaxel (Taxol)      To help prevent nausea and vomiting after your treatment, we encourage you to take your nausea medication as directed.  BELOW ARE SYMPTOMS THAT SHOULD BE REPORTED IMMEDIATELY: *FEVER GREATER THAN 100.4 F (38 C) OR HIGHER *CHILLS OR SWEATING *NAUSEA AND VOMITING THAT IS NOT CONTROLLED WITH YOUR NAUSEA MEDICATION *UNUSUAL SHORTNESS OF BREATH *UNUSUAL BRUISING OR BLEEDING *URINARY PROBLEMS (pain or burning when urinating, or frequent urination) *BOWEL PROBLEMS (unusual diarrhea, constipation, pain near the anus) TENDERNESS IN MOUTH AND THROAT WITH OR WITHOUT PRESENCE OF ULCERS (sore throat, sores in mouth, or a toothache) UNUSUAL RASH, SWELLING OR PAIN  UNUSUAL VAGINAL DISCHARGE OR ITCHING   Items with * indicate a potential emergency and should be followed up as soon as possible or go to the Emergency Department if any problems should occur.  Please show the CHEMOTHERAPY ALERT CARD or IMMUNOTHERAPY ALERT CARD at  check-in to the Emergency Department and triage nurse.  Should you have questions after your visit or need to cancel or reschedule your appointment, please contact [facility]  Dept: [phone]  and follow the prompts.  Office hours are 8:00 a.m. to 4:30 p.m. Monday - Friday. Please note that voicemails left after 4:00 p.m. may not be returned until the following business day.  We are closed weekends and major holidays. You have access to a nurse at all times for urgent questions. Please call the main number to the clinic Dept: [phone] and follow the prompts.   For any non-urgent questions, you may also contact your provider using MyChart. We now offer e-Visits for anyone 27 and older to request care online for non-urgent symptoms. For details visit [email or web address].   Also download the MyChart app! Go to the app store, search "MyChart", open the app, select [facility], and log in with your MyChart username and password.  Due to Covid, a mask is required upon entering the hospital/clinic. If you do not have a mask, one will be given to you upon arrival. For doctor visits, patients may have 1 support person aged 46 or older with them. For treatment visits, patients cannot have anyone with them due to current Covid guidelines and our immunocompromised population.

## 2021-03-11 NOTE — Progress Notes (Signed)
Re-ordered Debbie [name]

## 2021-03-11 NOTE — Progress Notes (Signed)
Debbie [name]  [phone]) [phone] Fax:([phone]) [phone]    ID: ARMANII URBANIK DOB: 27-Nov-1965  MR#: [id number]  QVZ#:[id number]  Patient Care Team: Kelton Pillar, MD as PCP - General (Family Medicine) Jacelyn Pi, MD as Consulting Physician (Endocrinology) Jovita Kussmaul, MD as Consulting Physician (General Surgery) [name], Virgie Dad, MD as Consulting Physician (Oncology) [name], MD as Attending Physician (Radiation Oncology) Belva Crome, MD as Consulting Physician (Cardiology) [name], [name], NP as Nurse Practitioner (Hematology and Oncology) Lajoyce Lauber, DMD (Dentistry) Raina Mina, RPH-CPP (Pharmacist) Nicholas Lose, MD as Consulting Physician (Hematology and Oncology) OTHER MD:   CHIEF COMPLAINT: Estrogen receptor positive stage IV  breast cancer (s/p bilateral mastectomies)  CURRENT TREATMENT: paclitaxel; denosumab/Xgeva   INTERVAL HISTORY: Debbie [name] returns today for follow up and treatment of her stage IV breast cancer.  Debbie [name] most recent restaging was completed with chest CT and MRI abdomen on 02/07/2021.  The chest CT showed: slight increased conspicuity of small pulmonary nodules in lungs, possibility of metastatic disease no excluded; persistent right hilar and subcarinal lymphadenopathy, concerning for metastatic disease; widespread metastatic disease to bones, similar to prior study in 11/2020.  The MRI showed: significant decrease in hepatic metastatic disease; no significant change in bone metastases; no new or progressive metastatic disease.  She was started on paclitaxel on 09/10/2020, initially 3 weeks on 1 week off.. She now receives treatments days 1 and 15 of each 28-day cycle.  Debbie [name] continues to tolerate her treatment well.  She has no progression of her peripheral neuropathy.  She notes that she has had a slight decreased appetite.  She has no new pain and continues to take oxycodone once or twice a week  for pain but usually she is able to use Tylenol with Aleve that alleviates for her chronic cancer-related left lower back pain.  She continues on Xgeva every 4 weeks, with her most recent dose on 01/14/2021.  She tolerates Xgeva well and has no dental concerns.   We are following her CA 27-29: Lab Results  Component Value Date   CA2729 176.4 (H) 02/25/2021   CA2729 135.4 (H) 02/11/2021   CA2729 142.6 (H) 01/28/2021   CA2729 115.1 (H) 01/14/2021   CA2729 100.9 (H) 12/31/2020    REVIEW OF SYSTEMS: Review of Systems  Constitutional:  Positive for appetite change (Mild decreased appetite) and fatigue (mild and chronic, still working full time). Negative for chills, fever and unexpected weight change.  HENT:   Negative for hearing loss, lump/mass and trouble swallowing.   Eyes:  Negative for eye problems and icterus.  Respiratory:  Negative for chest tightness, cough and shortness of breath.   Cardiovascular:  Negative for chest pain, leg swelling and palpitations.  Gastrointestinal:  Negative for abdominal distention, abdominal pain, constipation, diarrhea, nausea and vomiting.  Endocrine: Negative for hot flashes.  Genitourinary:  Negative for difficulty urinating.   Musculoskeletal:  Positive for back pain (chronic and per interval history). Negative for arthralgias.  Skin:  Negative for itching and rash.  Neurological:  Negative for dizziness, extremity weakness, headaches and numbness.  Hematological:  Negative for adenopathy. Does not bruise/bleed easily.  Psychiatric/Behavioral:  Negative for depression. The patient is not nervous/anxious.      COVID 19 VACCINATION STATUS: Status post Pfizer x2, with booster January 2021   BREAST CANCER HISTORY: From the original intake note:  Debbie [name] had screening mammography with tomosynthesis at the [facility] 10/02/2015. This showed 2 possible masses in the right breast as  well as some suspicious calcifications. The left breast was  unremarkable. On 10/15/2015 she underwent right diagnostic mammography with tomography and right breast ultrasonography. The breast density was category B. Spot magnification views confirmed an area of pleomorphic calcifications measuring up to 5 cm in the upper half of the breast. Also in the upper outer right breast there were adjacent low-density masses without distortion or calcifications. On physical exam there was no palpable abnormality.   Targeted ultrasound of the right breast found the 2 "masses" in the right upper quadrant were benign cysts measuring 0.8 and 0.5 cm respectively. Biopsy of the area of calcification on 10/15/2015 showed ([id number]) invasive ductal carcinoma, E-cadherin positive, grade 2, estrogen receptor 90% positive, progesterone receptor 50% positive, both with strong staining intensity, with an MIB-1 of 70%, and no HER-2 amplification, the signals ratio being 1.18 and the number per cell 2.60.   Her subsequent history is as detailed below    PAST MEDICAL HISTORY: Past Medical History:  Diagnosis Date   Anxiety    Arthritis    Bone metastasis ([place])    Breast cancer of upper-inner quadrant of right female breast [facility]) oncologist-  dr [name]--- 04/ 2019 ER/PR+ Stage IV w/ metastatic disease to liver, total spine, lung, bone   dx 06/ 2017  right breast invasive ductal carcinoma, Stage IIB, ER/PR + (pT2 pN1), grade 1-- s/p  right breast lumpecotmy w/ sln bx's and bilateral breast reduction 11-28-2015/  left breast with reduction , dx invasive ductal ca, Grade 1 (pT1cpNX) ER/PR positive/  adjuvant radiation completed 03-27-2016 to right chest wall, 2018  reclassifiec Stage IIA   Cancer, metastatic to liver ([place])    Chronic back pain    Constipation    Depression    Family history of adverse reaction to anesthesia     mother has Copd was kept on vent    Headache    Heart murmur    History of hyperthyroidism    s/p  RAI   History of radiation therapy 02/11/16-  03/27/16   Right Chest Wall 50.4 Gy in 28 fractions, Right Chest Wall Boost 10 Gy in 5 fractions.    Hyperlipidemia    Hypertension    Hypothyroidism, postradioiodine therapy    endocrinoloigst-  dr balan--  2004  s/p  RAI   Menorrhagia 2011   Multiple pulmonary nodules    secondary  metastatic   PONV (postoperative nausea and vomiting)    patch helps    SUI (stress urinary incontinence, female)    Wears contact lenses    Wears dentures    full upper and lower partial    PAST SURGICAL HISTORY: Past Surgical History:  Procedure Laterality Date   BREAST LUMPECTOMY WITH NEEDLE LOCALIZATION AND AXILLARY SENTINEL LYMPH NODE BX Right 11/28/2015   Procedure: RIGHT BREAST LUMPECTOMY WITH NEEDLE LOCALIZATION AND AXILLARY SENTINEL LYMPH NODE BX;  Surgeon: Autumn Messing III, MD;  Location: [place];  Service: General;  Laterality: Right;   BREAST REDUCTION SURGERY Bilateral 11/28/2015   Procedure: MAMMARY REDUCTION  (BREAST) BILATERAL;  Surgeon: Crissie Reese, MD;  Location: [place];  Service: Plastics;  Laterality: Bilateral;   CESAREAN SECTION  01/12/1993   COLONOSCOPY     Dental surgeries     FEMUR IM NAIL Left 01/28/2018   Procedure: INTRAMEDULLARY (IM) NAIL FEMORAL;  Surgeon: Nicholes Stairs, MD;  Location: [place];  Service: Orthopedics;  Laterality: Left;   HYSTEROSCOPY N/A 09/02/2017   Procedure: HYSTEROSCOPY  to remove IUD;  Surgeon: Eldred Manges, MD;  Location: [facility];  Service: Gynecology;  Laterality: N/A;   IR IMAGING GUIDED PORT INSERTION  09/16/2020   Liver biospy     MASTECTOMY     MASTECTOMY W/ SENTINEL NODE BIOPSY Bilateral 12/26/2015   Procedure: BILATERAL MASTECTOMY WITH LEFT SENTINEL LYMPH NODE BIOPSY;  Surgeon: Autumn Messing III, MD;  Location: [place];  Service: General;  Laterality: Bilateral;   Removal of IUD     TUBAL LIGATION Bilateral 1995    FAMILY HISTORY: Family History  Problem Relation Age of Onset   Diabetes Mother    Hypertension Mother     Hypertension Father    Diabetes Maternal Grandmother    Hypertension Sister    Cancer Paternal Grandmother        colon   Colon cancer Paternal Grandmother    Esophageal cancer Neg Hx    Stomach cancer Neg Hx    Rectal cancer Neg Hx   The patient's parents are still living, in their early 44s as of June 2017. The patient has one brother, 2 sisters. On the maternal side there is a history of uterine and prostate cancer. On the paternal side there is a history of colon cancer and possibly uterine cancer. There is no history of breast or ovarian cancer in the family.    GYNECOLOGIC HISTORY:  Patient's last menstrual period was 12/19/2015.  Menarche age 73. She still having regular periods. She is GX P1, first pregnancy age 50. She used oral contraceptives for a few years remotely, with no complications. She is currently on a Mirena IUD and also is status post bilateral tubal ligation.   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 Her husband [name] is a Administrator.  They are both taking appropriate precautions at work.  Their son [name], s a radio DJ.  The patient is expecting her first grandchild May 2023.  She is not a Ambulance person.   ADVANCED DIRECTIVES: In the absence of any documents to the contrary her husband is her healthcare power of attorney   HEALTH MAINTENANCE: Social History   Tobacco Use   Smoking status: Never   Smokeless tobacco: Never  Vaping Use   Vaping Use: Never used  Substance Use Topics   Alcohol use: No   Drug use: No     Colonoscopy: January 2016   PAP: January 2016   Bone density: August 2017 showed a T score of +0.7 normal  Lipid panel:  Allergies  Allergen Reactions   Ace Inhibitors Cough   Atorvastatin Other (See Comments)    Joint pain   Simvastatin Other (See Comments)    Joint pain    Current Outpatient Medications  Medication Sig Dispense Refill   acetaminophen (TYLENOL) 500 MG  tablet Take 1,000 mg by mouth every 6 (six) hours as needed.     Calcium-Magnesium 250-125 MG TABS Take 1 tablet by mouth daily. 120 each 4   cetirizine (ZYRTEC) 10 MG tablet Take 10 mg by mouth daily as needed for allergies.      chlorhexidine (PERIDEX) 0.12 % solution Use 5 mLs in the mouth or throat 2 (two) times daily as needed as directed. Do not swallow 473 mL 6   doxycycline (VIBRA-TABS) 100 MG tablet Take 1 tablet (100 mg total) by mouth daily. 30 tablet 2   furosemide (LASIX) 20 MG tablet Take 1 tablet (20 mg total) by mouth daily. 90 tablet 6  levothyroxine (SYNTHROID) 175 MCG tablet Take 1 tablet (175 mcg total) by mouth in the morning on an empty stomach 30 tablet 6   lidocaine-prilocaine (EMLA) cream Apply to affected area once 30 g 3   losartan-hydrochlorothiazide (HYZAAR) 100-25 MG tablet Take 1 tablet by mouth once a day 90 tablet 2   Multiple Vitamin (MULTIVITAMIN) tablet Take 1 tablet by mouth daily.     omega-3 acid ethyl esters (LOVAZA) 1 g capsule Take 1 g by mouth 2 (two) times daily.      omeprazole (PRILOSEC) 40 MG capsule Take 1 capsule (40 mg total) by mouth daily. 30 capsule 3   oxyCODONE (OXY IR/ROXICODONE) 5 MG immediate release tablet Take 1 tablet (5 mg total) by mouth every 4 (four) hours as needed for severe pain. 60 tablet 0   potassium chloride SA (KLOR-CON) 20 MEQ tablet Take 2 tablets (40 mEq total) by mouth daily. 90 tablet 4   pregabalin (LYRICA) 100 MG capsule Take 1 capsule (100 mg total) by mouth 3 (three) times daily. 90 capsule 0   prochlorperazine (COMPAZINE) 10 MG tablet Take 1 tablet (10 mg total) by mouth every 6 (six) hours as needed (Nausea or vomiting). 30 tablet 1   rosuvastatin (CRESTOR) 5 MG tablet Take 1 tablet (5 mg total) by mouth daily. 30 tablet 1   sertraline (ZOLOFT) 100 MG tablet TAKE 1 TABLET BY MOUTH ONCE A DAY 30 tablet 12   valACYclovir (VALTREX) 1000 MG tablet Take 1 tablet (1,000 mg total) by mouth daily. 60 tablet 6   Vitamin D,  Cholecalciferol, 1000 units CAPS Take 1 tablet by mouth daily. (Patient taking differently: Take 1,000 Units by mouth daily.) 90 capsule 4   No current facility-administered medications for this visit.    OBJECTIVE:  Vitals:   03/11/21 0817  BP: (!) 148/72  Pulse: 91  Resp: 18  Temp: 97.9 F (36.6 C)  SpO2: 100%     Body mass index is 48.03 kg/m.    ECOG FS:1 - Symptomatic but completely ambulatory  Filed Weights   03/11/21 0817  Weight: 297 lb 9.6 oz (135 kg)  GENERAL: Patient is a well appearing female in no acute distress HEENT:  Sclerae anicteric.  Oropharynx clear and moist. No ulcerations or evidence of oropharyngeal candidiasis. Neck is supple.  NODES:  No cervical, supraclavicular, or axillary lymphadenopathy palpated.  BREAST EXAM:  Deferred. LUNGS:  Clear to auscultation bilaterally.  No wheezes or rhonchi. HEART:  Regular rate and rhythm. No murmur appreciated. ABDOMEN:  Soft, nontender.  Positive, normoactive bowel sounds. No organomegaly palpated. MSK:  No focal spinal tenderness to palpation.  EXTREMITIES:  right arm lymphedema present. SKIN:  Clear with no obvious rashes or skin changes. No nail dyscrasia. NEURO:  Nonfocal. Well oriented.  Appropriate affect.    LAB RESULTS:  CMP     Component Value Date/Time   NA 142 02/25/2021 0803   NA 140 07/08/2016 0948   K 3.2 (L) 02/25/2021 0803   K 4.1 07/08/2016 0948   CL 102 02/25/2021 0803   CO2 27 02/25/2021 0803   CO2 24 07/08/2016 0948   GLUCOSE 103 (H) 02/25/2021 0803   GLUCOSE 89 07/08/2016 0948   BUN 14 02/25/2021 0803   BUN 11.9 07/08/2016 0948   CREATININE 0.86 02/25/2021 0803   CREATININE 0.84 08/22/2020 1050   CREATININE 0.9 07/08/2016 0948   CALCIUM 9.2 02/25/2021 0803   CALCIUM 10.0 07/08/2016 0948   PROT 6.9 02/25/2021 0803   PROT  7.6 07/08/2016 0948   ALBUMIN 3.3 (L) 02/25/2021 0803   ALBUMIN 3.6 07/08/2016 0948   AST 17 02/25/2021 0803   AST 16 08/22/2020 1050   AST 22 07/08/2016  0948   ALT 18 02/25/2021 0803   ALT 14 08/22/2020 1050   ALT 27 07/08/2016 0948   ALKPHOS 130 (H) 02/25/2021 0803   ALKPHOS 115 07/08/2016 0948   BILITOT 0.3 02/25/2021 0803   BILITOT 0.3 08/22/2020 1050   BILITOT 0.35 07/08/2016 0948   GFRNONAA >60 02/25/2021 0803   GFRNONAA >60 08/22/2020 1050   GFRAA >60 01/30/2020 0814   GFRAA >60 05/16/2019 0905    INo results found for: SPEP, UPEP  Lab Results  Component Value Date   WBC 4.9 03/11/2021   NEUTROABS 3.5 03/11/2021   HGB 10.5 (L) 03/11/2021   HCT 32.4 (L) 03/11/2021   MCV 90.3 03/11/2021   PLT 263 03/11/2021      Chemistry      Component Value Date/Time   NA 142 02/25/2021 0803   NA 140 07/08/2016 0948   K 3.2 (L) 02/25/2021 0803   K 4.1 07/08/2016 0948   CL 102 02/25/2021 0803   CO2 27 02/25/2021 0803   CO2 24 07/08/2016 0948   BUN 14 02/25/2021 0803   BUN 11.9 07/08/2016 0948   CREATININE 0.86 02/25/2021 0803   CREATININE 0.84 08/22/2020 1050   CREATININE 0.9 07/08/2016 0948      Component Value Date/Time   CALCIUM 9.2 02/25/2021 0803   CALCIUM 10.0 07/08/2016 0948   ALKPHOS 130 (H) 02/25/2021 0803   ALKPHOS 115 07/08/2016 0948   AST 17 02/25/2021 0803   AST 16 08/22/2020 1050   AST 22 07/08/2016 0948   ALT 18 02/25/2021 0803   ALT 14 08/22/2020 1050   ALT 27 07/08/2016 0948   BILITOT 0.3 02/25/2021 0803   BILITOT 0.3 08/22/2020 1050   BILITOT 0.35 07/08/2016 0948       No results found for: LABCA2  No components found for: LABCA125  No results for input(s): INR in the last 168 hours.  Urinalysis No results found for: COLORURINE, APPEARANCEUR, LABSPEC, PHURINE, GLUCOSEU, HGBUR, BILIRUBINUR, KETONESUR, PROTEINUR, UROBILINOGEN, NITRITE, LEUKOCYTESUR   STUDIES: No results found.   ELIGIBLE FOR AVAILABLE RESEARCH PROTOCOL: PALLAS, but inelegible because of Mirena IUD   ASSESSMENT: 55 y.o. [place] woman status post right breast upper inner quadrant biopsy 10/15/2015 for a clinical T2-T3  N0, stage II invasive ductal carcinoma, estrogen and progesterone receptor positive, HER-2 nonamplified, with an Mib-1 of 70%  (1) status post right lumpectomy and sentinel lymph node sampling with oncoplastic breast reduction 11/28/2015 for a pT2 pN1, stage IIB invasive ductal carcinoma, grade 1, with negative margins (1/1 sentinel node positive)  (a) reclassified as stage IIA in the 2018 prognostic revision   (2) left reduction mammoplasty 11/28/2015 unexpectedly found a pT1c pNX invasive ductal carcinoma, grade 1, estrogen and progesterone receptor positive, HER-2 not amplified, with an MIB-1 of 3%  (3) Mammaprint from the right sided breast cancer was "low risk", predicting a 98% chance of no disease recurrence within 5 years with anti-estrogens as the only systemic therapy. It also predicts minimal to no benefit from chemotherapy  (4) status post bilateral mastectomies with bilateral sentinel lymph node sampling 12/26/2015, showing  (a) on the right, no residual carcinoma (0/3 nodes involved)  (b) on the left, focal ductal carcinoma in situ, 0.2 cm, with negative margins; (0/4 nodes involved)  (5) adjuvant radiation 02/11/16 - 03/27/16  1) Right chest wall: 50.4 Gy in 28 fractions               2) Right chest wall boost: 10 Gy in 5 fractions   (6) started on tamoxifen neoadjuvantly 10/23/2015 to allow for expected delays in definitive local treatment  (a) Mirena IUD in place  (b) tamoxifen discontinued 08/13/2017, with progression  (7) since she had 4 lymph nodes removed from each axilla but also receive radiation on the right, lab draws should preferentially be obtained from the left arm   METASTATIC DISEASE: April 2019 (8) CT scan of the abdomen and pelvis 08/04/2017, MRI of the liver and total spine MRI 08/11/2017 showed liver and bone metastases  (a) liver biopsy 09/07/2017 confirms estrogen receptor positive, progesterone receptor negative, HER-2 negative metastatic breast  cancer  (b) chest CT scan 08/13/2017 shows multiple bilateral pulmonary nodules.  (c) CT of the brain with and without contrast 08/26/2017 shows no evidence of metastatic disease to the brain  (d) CA 27-29 is informative  (9) fulvestrant started 08/17/2017  (a) palbociclib started 08/31/2017  (b) fulvestrant and palbociclib discontinued 01/04/2018 with evidence of progression in the liver  (10) denosumab/Xgeva started 09/14/2017  (a) held after 03/01/2018 dose because concern re. osteonecrosis  (b) resumed 06/09/2018  (c) 02/21/2019 dose held due to oral surgery, resumed 03/21/2019  (11) anastrozole started 01/04/2018  (a) everolimus started 01/29/2018  (b) liver MRI and bone scan December 2019 show stable disease  (c) chest CT scan on 08/02/2018 shows lung lesions resolved, bone lesions stable  (d) bone scan 09/13/2018 essentially stable  (e) liver MRI 12/08/2018 shows improvement in hepatic metastsases  (f) liver MRI and bone scan 04/13/2019 showed continuing improvement in the liver, stable bone lesions  (g) chest CT 08/01/2019 stable, no evidence of active disease ows  (12) 01/28/18 prophylactic left femur intramedullary nail surgery for impending left femur pathologic fracture  (a) palliative radiation with Dr. [name] to the left femur and left ilium from 02/16/2018 to 03/01/2018  (13) [facility] report on liver biopsy from April 2019 shows a negative PDL 1, stable MSI and proficient mismatch repair status.  The tumor is positive for the androgen receptor, and for PTEN for PIK3 CA mutations.  HER-2/neu was read as equivocal (it was negative by FISH on the pathology report).  (14) Progression noted in liver MRI on 01/11/2020; fulvestrant started on 01/30/2020  (a) alpelisib started 01/30/2020 at 300 mg daily with Metformin 500 mg daily  (b) alpelisib held on 02/07/2020 with very high blood sugars.  Metformin increased to 1 g daily  (c) alpelisib resumed at 150 mg daily on 02/13/2020  (d)  liver MRI 05/06/2020 shows evidence of disease progression  (e) fulvestrant and alpelisib discontinued January 2022  (15) capecitabine started 05/22/2020 at 1.5 g twice daily, 14/7  (a) abdominal MRI 08/23/2020 shows worsening liver disease  (b) chest CT scan 09/04/2020 shows new lung metastases  (c) capecitabine discontinued April 2022  (16) paclitaxel started 09/10/2020, repeated days 1, 8, and 15 of each 28-day cycle  (A) changed to every 2 weeks beginning with cycle 3 (11/05/2020)  (B) MRI of the abdomen 02/09/2021 shows evidence of response  (C) chest CT 02/10/2021 shows stable disease   PLAN: Debbie [name] is here today to receive her every other week paclitaxel that we are giving her for her metastatic breast cancer.  We reviewed the fact that her tumor marker with the CA 27-29 has been very gradually increasing along with the fact  that she has a slight decrease in appetite she has an appointment to see Dr. [name] December 6 so we will go ahead and get a CT chest and MRI of the abdomen to evaluate for any progression of her cancer.  I encouraged her to continue with healthy diet and activity level.  She continues to do very well with this as she continues to work full-time.  Her pain is managed with Tylenol and Aleve and if unrelieved with those methods she has a as needed oxycodone prescription that we wrote for her back in May.  We will see Debbie [name] back in 2 weeks for labs follow-up and her next paclitaxel treatment. She knows to call for any questions that may arise between now and her next appointment.  We are happy to see her sooner if needed.   Total encounter time 20 minutes.*In face-to-face visit time chart review lab review order entry care coordination and documentation of the encounter.  Wilber Bihari, NP 03/11/21 8:43 AM Medical Oncology and Hematology [facility] [place], [place] [address] Tel. [phone]    Fax. [phone]    *Total  Encounter Time as defined by the Centers for Medicare and Medicaid Services includes, in addition to the face-to-face time of a patient visit (documented in the note above) non-face-to-face time: obtaining and reviewing outside history, ordering and reviewing medications, tests or procedures, care coordination (communications with other health care professionals or caregivers) and documentation in the medical record.

## 2021-03-12 LAB — CANCER ANTIGEN 27.29: CA 27.29: 165.8 U/mL — ABNORMAL HIGH (ref 0.0–38.6)

## 2021-03-20 ENCOUNTER — Ambulatory Visit: Payer: 59 | Attending: Hematology and Oncology

## 2021-03-20 ENCOUNTER — Other Ambulatory Visit (HOSPITAL_COMMUNITY): Payer: Self-pay

## 2021-03-20 ENCOUNTER — Other Ambulatory Visit: Payer: Self-pay

## 2021-03-20 DIAGNOSIS — C50919 Malignant neoplasm of unspecified site of unspecified female breast: Secondary | ICD-10-CM | POA: Diagnosis not present

## 2021-03-20 DIAGNOSIS — E890 Postprocedural hypothyroidism: Secondary | ICD-10-CM | POA: Diagnosis not present

## 2021-03-20 DIAGNOSIS — R293 Abnormal posture: Secondary | ICD-10-CM | POA: Diagnosis not present

## 2021-03-20 DIAGNOSIS — M6281 Muscle weakness (generalized): Secondary | ICD-10-CM | POA: Insufficient documentation

## 2021-03-20 DIAGNOSIS — C7800 Secondary malignant neoplasm of unspecified lung: Secondary | ICD-10-CM | POA: Diagnosis not present

## 2021-03-20 DIAGNOSIS — I972 Postmastectomy lymphedema syndrome: Secondary | ICD-10-CM | POA: Diagnosis not present

## 2021-03-20 DIAGNOSIS — M545 Low back pain, unspecified: Secondary | ICD-10-CM | POA: Insufficient documentation

## 2021-03-20 DIAGNOSIS — G8929 Other chronic pain: Secondary | ICD-10-CM | POA: Diagnosis not present

## 2021-03-20 NOTE — Therapy (Signed)
[facility] @ [facility] [place], [place], [address] Phone: [phone]   Fax:  [phone]  Physical Therapy Treatment  Patient Details  Name: Debbie [name] MRN: [id number] Date of Birth: 12/29/1965 Referring Provider (PT): Dr. [name]   Encounter Date: 03/20/2021   PT End of Session - 03/20/21 1309     Visit Number 12    Number of Visits 17    Date for PT Re-Evaluation 04/10/21    PT Start Time 1300    PT Stop Time 0347    PT Time Calculation (min) 47 min    Activity Tolerance Patient tolerated treatment well    Behavior During Therapy [facility] for tasks assessed/performed             Past Medical History:  Diagnosis Date   Anxiety    Arthritis    Bone metastasis ([place])    Breast cancer of upper-inner quadrant of right female breast [facility]) oncologist-  dr [name]--- 04/ 2019 ER/PR+ Stage IV w/ metastatic disease to liver, total spine, lung, bone   dx 06/ 2017  right breast invasive ductal carcinoma, Stage IIB, ER/PR + (pT2 pN1), grade 1-- s/p  right breast lumpecotmy w/ sln bx's and bilateral breast reduction 11-28-2015/  left breast with reduction , dx invasive ductal ca, Grade 1 (pT1cpNX) ER/PR positive/  adjuvant radiation completed 03-27-2016 to right chest wall, 2018  reclassifiec Stage IIA   Cancer, metastatic to liver ([place])    Chronic back pain    Constipation    Depression    Family history of adverse reaction to anesthesia     mother has Copd was kept on vent    Headache    Heart murmur    History of hyperthyroidism    s/p  RAI   History of radiation therapy 02/11/16- 03/27/16   Right Chest Wall 50.4 Gy in 28 fractions, Right Chest Wall Boost 10 Gy in 5 fractions.    Hyperlipidemia    Hypertension    Hypothyroidism, postradioiodine therapy    endocrinoloigst-  dr balan--  2004  s/p  RAI   Menorrhagia 2011   Multiple pulmonary nodules    secondary  metastatic   PONV (postoperative nausea and  vomiting)    patch helps    SUI (stress urinary incontinence, female)    Wears contact lenses    Wears dentures    full upper and lower partial    Past Surgical History:  Procedure Laterality Date   BREAST LUMPECTOMY WITH NEEDLE LOCALIZATION AND AXILLARY SENTINEL LYMPH NODE BX Right 11/28/2015   Procedure: RIGHT BREAST LUMPECTOMY WITH NEEDLE LOCALIZATION AND AXILLARY SENTINEL LYMPH NODE BX;  Surgeon: Autumn Messing III, MD;  Location: [facility];  Service: General;  Laterality: Right;   BREAST REDUCTION SURGERY Bilateral 11/28/2015   Procedure: MAMMARY REDUCTION  (BREAST) BILATERAL;  Surgeon: Crissie Reese, MD;  Location: [facility];  Service: Plastics;  Laterality: Bilateral;   CESAREAN SECTION  01/12/1993   COLONOSCOPY     Dental surgeries     FEMUR IM NAIL Left 01/28/2018   Procedure: INTRAMEDULLARY (IM) NAIL FEMORAL;  Surgeon: Nicholes Stairs, MD;  Location: [facility];  Service: Orthopedics;  Laterality: Left;   HYSTEROSCOPY N/A 09/02/2017   Procedure: HYSTEROSCOPY  to remove IUD;  Surgeon: Eldred Manges, MD;  Location: [facility];  Service: Gynecology;  Laterality: N/A;   IR IMAGING GUIDED PORT INSERTION  09/16/2020   Liver biospy  MASTECTOMY     MASTECTOMY W/ SENTINEL NODE BIOPSY Bilateral 12/26/2015   Procedure: BILATERAL MASTECTOMY WITH LEFT SENTINEL LYMPH NODE BIOPSY;  Surgeon: Autumn Messing III, MD;  Location: [facility];  Service: General;  Laterality: Bilateral;   Removal of IUD     TUBAL LIGATION Bilateral 1995    There were no vitals filed for this visit.   Subjective Assessment - 03/20/21 1302     Subjective I haven't been to the pool in 3-4 weeks because they didn't have openings. I have been sick with a cough so I had to cancel some.  My back still hurts, but I can do a little more.  I can walk in pretty good to a store, but coming out it will bother. Pt was wearing sleeve however, did not have her glove on and hand was visibly swollen. She felt like swelling would  reverse when she gets home to put her glove on.    Pertinent History Stage IV breast Cancer s/p Bilateral Mastectomies with METS to Liver, spine, lung, Bone.  Has left  Femur IM rod.  Presently on Paclitaxel Day 1 and 15 of 28 day cycle.  Stable Right UE lymphedema. 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 unlimted, stiff when she rises    How long can you stand comfortably? 5-10 min then has to sit or lean    How long can you walk comfortably? several hundred yards.    Patient Stated Goals Decrease pain, improve mobility to perform activities better    Currently in Pain? Yes    Pain Score 4     Pain Location Back    Pain Orientation Lower;Mid    Pain Descriptors / Indicators Heaviness    Pain Onset More than a month ago    Pain Frequency Intermittent                       Flowsheet Row Outpatient Rehab from 12/08/2018 in [place]  Lymphedema Life Impact Scale Total Score 11.76 %             [facility] Adult PT Treatment/Exercise - 03/20/21 0001       Neuro Re-ed    Neuro Re-ed Details  standing on AX;eyes closed 2 x 30, tandem stance on floor; eyes closed x 20 sec      Lumbar Exercises: Stretches   Other Lumbar Stretch Exercise pelvic rocking and lateral shifting seated on mat table x 5 ea      Lumbar Exercises: Aerobic   Nustep Lev 2 for 10 min, seat 9 LE's only,      Lumbar Exercises: Supine   Ab Set 10 reps;5 seconds    Bridge with [employer] Limitations no bridge 5" hold x 10    Basic Lumbar Stabilization Limitations LTR ea x 5      Knee/Hip Exercises: Standing   Hip Flexion Stengthening;Both;1 set;10 reps   no hand hold   Hip Abduction Both;10 reps    Abduction Limitations HHA on bars    Hip Extension Stengthening;Both;1 set;10 reps    Extension Limitations HHA on parallel bars    Other Standing Knee Exercises heel raises x 2x 10   holding bars     Knee/Hip Exercises: Seated    Long Arc Quad Strengthening;Left;2 sets;5 reps  PT Long Term Goals - 02/13/21 1407       PT LONG TERM GOAL #1   Title Pt will be independent with HEP for lumbar, core and generalized strength    Time 6    Period Weeks    Status On-going      PT LONG TERM GOAL #2   Title Pt will have LBP decreased by atleast 50%    Baseline 02/13/21- 50% improvement    Time 6    Period Weeks    Status Achieved      PT LONG TERM GOAL #3   Title pt will be able to shower without sitting on shower chair    Baseline 02/13/21- pt still requires a shower chair because at the end of the day her back hurts    Time 6    Period Weeks    Status On-going      PT LONG TERM GOAL #4   Title pt will be able to wash dishes for 10-20 minutes with minimal complaints of LBP    Baseline 02/13/21- pt reports this has reamained about the same, she needs to lean on the sink    Time 6    Period Weeks    Status On-going      PT LONG TERM GOAL #5   Title pt will be able to rise from chair without pain or stiffness    Baseline 02/13/21- pt is able to do this    Time 6    Period Weeks    Status Achieved                   Plan - 03/20/21 1309     Clinical Impression Statement pt has switched to doing all land based exercises as she feels it is too cold now to go to the pool.  She did well with NuSTep and was able to increase to lev 2 today using legs only.  She performed several standing exercises and when she started to experience tightness in her LB  we switched to seated/supine exercises.  pt was observed to get uncomfortable in supine and noted pain/tightness had increased slightly. She felt short lasting relief of pain with pelvic rocks and lateral shifting. Pain 5/10 and  Therapy was discontinued. pts hand was more swollen because she was wearing her sleeve without her glove.  She will put on her glove when she gets home.    Personal Factors and Comorbidities  Comorbidity 3+    Comorbidities Bilateral mastectomies for Breast CA with radiation, Bone and liver METS, right UE lymphedema    Examination-Activity Limitations [place];Hygiene/Grooming;Stand;Lift    Stability/Clinical Decision Making Evolving/Moderate complexity    Rehab Potential Good    PT Frequency 1x / week    PT Duration 8 weeks    PT Treatment/Interventions Aquatic Therapy;ADLs/Self Care Home Management;Therapeutic exercise;Neuromuscular re-education;Manual techniques;Patient/family education;Therapeutic activities    PT Next Visit Plan advance core strengthening and aerboic capacity challenges, continue improving core strength    PT Home Exercise Plan core control and strengthening for improved posture             Patient will benefit from skilled therapeutic intervention in order to improve the following deficits and impairments:  Difficulty walking, Pain, Decreased knowledge of precautions, Decreased activity tolerance, Postural dysfunction, Decreased strength, Decreased mobility  Visit Diagnosis: Chronic bilateral low back pain without sciatica  Abnormal posture  Muscle weakness (generalized)  Primary malignant neoplasm of breast with metastasis to other site, unspecified  laterality [facility])  Malignant neoplasm metastatic to lung, unspecified laterality [facility])  Postmastectomy lymphedema     Problem List Patient Active Problem List   Diagnosis Date Noted   Encounter for antineoplastic chemotherapy 10/08/2020   Lower extremity edema 10/08/2020   Lung metastases ([place]) 09/10/2020   Goals of care, counseling/discussion 12/27/2018   Osteonecrosis ([place]) 11/01/2018   Pathological fracture in neoplastic disease, left femur, initial encounter for fracture ([place]) 01/28/2018   Morbid obesity with BMI of 40.0-44.9, adult ([place]) 87/19/5974   IUD complication ([place]) 71/85/5015   Bone metastases ([place]) 08/13/2017   Pain from bone metastases ([place]) 08/13/2017   Liver metastases ([place])  08/13/2017   Steatosis of liver 08/13/2017   Bilateral malignant neoplasm of breast in female, estrogen receptor positive ([place]) 12/26/2015   Malignant neoplasm of central portion of left breast in female, estrogen receptor positive ([place]) 11/28/2015   Malignant neoplasm of upper-inner quadrant of right breast in female, estrogen receptor positive ([place]) 10/17/2015   Hypertension 04/19/2012   Hypothyroid 04/19/2012   Fibroids 04/19/2012   Stress incontinence, female 04/19/2012    Claris Pong, PT 03/20/2021, 2:00 PM  [facility] @ [facility] [place], [place], [address] Phone: [phone]   Fax:  [phone]  Name: Debbie [name] MRN: [id number] Date of Birth: August 24, 1965

## 2021-03-24 MED FILL — Dexamethasone Sodium Phosphate Inj 100 MG/10ML: INTRAMUSCULAR | Qty: 1 | Status: AC

## 2021-03-25 ENCOUNTER — Other Ambulatory Visit (HOSPITAL_COMMUNITY): Payer: Self-pay

## 2021-03-25 ENCOUNTER — Other Ambulatory Visit: Payer: Self-pay

## 2021-03-25 ENCOUNTER — Inpatient Hospital Stay: Payer: 59

## 2021-03-25 ENCOUNTER — Inpatient Hospital Stay (HOSPITAL_BASED_OUTPATIENT_CLINIC_OR_DEPARTMENT_OTHER): Payer: 59 | Admitting: Adult Health

## 2021-03-25 VITALS — BP 162/68 | HR 85 | Temp 97.3°F | Resp 18 | Wt 292.6 lb

## 2021-03-25 DIAGNOSIS — C7800 Secondary malignant neoplasm of unspecified lung: Secondary | ICD-10-CM

## 2021-03-25 DIAGNOSIS — Z170 Estrogen receptor positive status [ER+]: Secondary | ICD-10-CM | POA: Diagnosis not present

## 2021-03-25 DIAGNOSIS — Z79899 Other long term (current) drug therapy: Secondary | ICD-10-CM | POA: Diagnosis not present

## 2021-03-25 DIAGNOSIS — Z5111 Encounter for antineoplastic chemotherapy: Secondary | ICD-10-CM | POA: Diagnosis not present

## 2021-03-25 DIAGNOSIS — Z9013 Acquired absence of bilateral breasts and nipples: Secondary | ICD-10-CM | POA: Diagnosis not present

## 2021-03-25 DIAGNOSIS — C50112 Malignant neoplasm of central portion of left female breast: Secondary | ICD-10-CM | POA: Diagnosis not present

## 2021-03-25 DIAGNOSIS — C7951 Secondary malignant neoplasm of bone: Secondary | ICD-10-CM | POA: Diagnosis not present

## 2021-03-25 DIAGNOSIS — Z923 Personal history of irradiation: Secondary | ICD-10-CM | POA: Diagnosis not present

## 2021-03-25 DIAGNOSIS — C50211 Malignant neoplasm of upper-inner quadrant of right female breast: Secondary | ICD-10-CM | POA: Diagnosis not present

## 2021-03-25 DIAGNOSIS — C787 Secondary malignant neoplasm of liver and intrahepatic bile duct: Secondary | ICD-10-CM

## 2021-03-25 DIAGNOSIS — Z95828 Presence of other vascular implants and grafts: Secondary | ICD-10-CM

## 2021-03-25 DIAGNOSIS — C50111 Malignant neoplasm of central portion of right female breast: Secondary | ICD-10-CM

## 2021-03-25 LAB — COMPREHENSIVE METABOLIC PANEL
ALT: 15 U/L (ref 0–44)
AST: 18 U/L (ref 15–41)
Albumin: 3.2 g/dL — ABNORMAL LOW (ref 3.5–5.0)
Alkaline Phosphatase: 114 U/L (ref 38–126)
Anion gap: 9 (ref 5–15)
BUN: 13 mg/dL (ref 6–20)
CO2: 28 mmol/L (ref 22–32)
Calcium: 9.2 mg/dL (ref 8.9–10.3)
Chloride: 103 mmol/L (ref 98–111)
Creatinine, Ser: 0.8 mg/dL (ref 0.44–1.00)
GFR, Estimated: >60 mL/min (ref >60)
Glucose, Bld: 100 mg/dL — ABNORMAL HIGH (ref 70–99)
Potassium: 3.3 mmol/L — ABNORMAL LOW (ref 3.5–5.1)
Sodium: 140 mmol/L (ref 135–145)
Total Bilirubin: 0.3 mg/dL (ref 0.3–1.2)
Total Protein: 6.8 g/dL (ref 6.5–8.1)

## 2021-03-25 LAB — CBC WITH DIFFERENTIAL/PLATELET
Abs Immature Granulocytes: 0.01 10*3/uL (ref 0.00–0.07)
Basophils Absolute: 0 10*3/uL (ref 0.0–0.1)
Basophils Relative: 1 %
Eosinophils Absolute: 0.1 10*3/uL (ref 0.0–0.5)
Eosinophils Relative: 2 %
HCT: 32 % — ABNORMAL LOW (ref 36.0–46.0)
Hemoglobin: 10.6 g/dL — ABNORMAL LOW (ref 12.0–15.0)
Immature Granulocytes: 0 %
Lymphocytes Relative: 14 %
Lymphs Abs: 0.7 10*3/uL (ref 0.7–4.0)
MCH: 29.7 pg (ref 26.0–34.0)
MCHC: 33.1 g/dL (ref 30.0–36.0)
MCV: 89.6 fL (ref 80.0–100.0)
Monocytes Absolute: 0.5 10*3/uL (ref 0.1–1.0)
Monocytes Relative: 10 %
Neutro Abs: 3.4 10*3/uL (ref 1.7–7.7)
Neutrophils Relative %: 73 %
Platelets: 266 10*3/uL (ref 150–400)
RBC: 3.57 MIL/uL — ABNORMAL LOW (ref 3.87–5.11)
RDW: 18.3 % — ABNORMAL HIGH (ref 11.5–15.5)
WBC: 4.7 10*3/uL (ref 4.0–10.5)
nRBC: 0 % (ref 0.0–0.2)

## 2021-03-25 MED ORDER — SODIUM CHLORIDE 0.9% FLUSH
10.0000 mL | INTRAVENOUS | Status: AC | PRN
  Administered 2021-03-25: 10 mL

## 2021-03-25 MED ORDER — SODIUM CHLORIDE 0.9% FLUSH
10.0000 mL | INTRAVENOUS | Status: DC | PRN
  Administered 2021-03-25: 10 mL

## 2021-03-25 MED ORDER — FAMOTIDINE 20 MG IN NS 100 ML IVPB
20.0000 mg | Freq: Once | INTRAVENOUS | Status: AC
  Administered 2021-03-25: 20 mg via INTRAVENOUS
  Filled 2021-03-25: qty 100

## 2021-03-25 MED ORDER — SODIUM CHLORIDE 0.9 % IV SOLN
80.0000 mg/m2 | Freq: Once | INTRAVENOUS | Status: AC
  Administered 2021-03-25: 192 mg via INTRAVENOUS
  Filled 2021-03-25: qty 32

## 2021-03-25 MED ORDER — SODIUM CHLORIDE 0.9 % IV SOLN
10.0000 mg | Freq: Once | INTRAVENOUS | Status: AC
  Administered 2021-03-25: 10 mg via INTRAVENOUS
  Filled 2021-03-25: qty 10

## 2021-03-25 MED ORDER — DIPHENHYDRAMINE HCL 50 MG/ML IJ SOLN
25.0000 mg | Freq: Once | INTRAMUSCULAR | Status: AC
  Administered 2021-03-25: 25 mg via INTRAVENOUS
  Filled 2021-03-25: qty 1

## 2021-03-25 MED ORDER — HEPARIN SOD (PORK) LOCK FLUSH 100 UNIT/ML IV SOLN
500.0000 [IU] | Freq: Once | INTRAVENOUS | Status: AC | PRN
  Administered 2021-03-25: 500 [IU]

## 2021-03-25 MED ORDER — SODIUM CHLORIDE 0.9 % IV SOLN: Freq: Once | INTRAVENOUS | Status: AC

## 2021-03-25 NOTE — Progress Notes (Signed)
[place]  [phone] [phone]    ID: OLEVA KOO DOB: 09-17-1965  MR#: [id number]  CYE#:[id number]  Patient Care Team: Kelton Pillar, MD as PCP - General (Family Medicine) Jacelyn Pi, MD as Consulting Physician (Endocrinology) Jovita Kussmaul, MD as Consulting Physician (General Surgery) [name], Virgie Dad, MD as Consulting Physician (Oncology) [name], MD as Attending Physician (Radiation Oncology) Belva Crome, MD as Consulting Physician (Cardiology) [name], [name], NP as Nurse Practitioner (Hematology and Oncology) Lajoyce Lauber, DMD (Dentistry) Raina Mina, RPH-CPP (Pharmacist) Nicholas Lose, MD as Consulting Physician (Hematology and Oncology) OTHER MD:   CHIEF COMPLAINT: Estrogen receptor positive stage IV  breast cancer (s/p bilateral mastectomies)  CURRENT TREATMENT: paclitaxel; denosumab/Xgeva   INTERVAL HISTORY: Debbie [name] returns today for follow up and treatment of her stage IV breast cancer.  Debbie [name] most recent restaging was completed with chest CT and MRI abdomen on 02/07/2021.  The chest CT showed: slight increased conspicuity of small pulmonary nodules in lungs, possibility of metastatic disease no excluded; persistent right hilar and subcarinal lymphadenopathy, concerning for metastatic disease; widespread metastatic disease to bones, similar to prior study in 11/2020.  The MRI showed: significant decrease in hepatic metastatic disease; no significant change in bone metastases; no new or progressive metastatic disease.  She was started on paclitaxel on 09/10/2020, initially 3 weeks on 1 week off.. She now receives treatments days 1 and 15 of each 28-day cycle.  Debbie [name] continues to tolerate her treatment well.  She has no worsening in peripheral neuropathy.    She continues on Xgeva every 4 weeks, she tolerates this well.    Debbie [name] is due to have repeat imaging on 12/1 and this has not  yet been scheduled.  Debbie [name] notes that her main question is whether or not she can paint her nails again.  She feels like she has lost a lot of herself dealing with her metastatic breast cancer diagnosis and treatment.  She would really like to restart paining her nails.   We are following her CA 27-29: Lab Results  Component Value Date   CA2729 176.4 (H) 02/25/2021   CA2729 135.4 (H) 02/11/2021   CA2729 142.6 (H) 01/28/2021   CA2729 115.1 (H) 01/14/2021   CA2729 100.9 (H) 12/31/2020    REVIEW OF SYSTEMS: Review of Systems  Constitutional:  Positive for appetite change (Mild decreased appetite, umchanged from prior) and fatigue (mild and chronic, unchanged). Negative for chills, fever and unexpected weight change.  HENT:   Negative for hearing loss, lump/mass and trouble swallowing.   Eyes:  Negative for eye problems and icterus.  Respiratory:  Negative for chest tightness, cough and shortness of breath.   Cardiovascular:  Negative for chest pain, leg swelling and palpitations.  Gastrointestinal:  Negative for abdominal distention, abdominal pain, constipation, diarrhea, nausea and vomiting.  Endocrine: Negative for hot flashes.  Genitourinary:  Negative for difficulty urinating.   Musculoskeletal:  Positive for back pain (chronic and unchanged). Negative for arthralgias.  Skin:  Negative for itching and rash.  Neurological:  Negative for dizziness, extremity weakness, headaches and numbness.  Hematological:  Negative for adenopathy. Does not bruise/bleed easily.  Psychiatric/Behavioral:  Negative for depression. The patient is not nervous/anxious.      COVID 19 VACCINATION STATUS: Status post Pfizer x2, with booster January 2021   BREAST CANCER HISTORY: From the original intake note:  Debbie [name] had screening mammography with tomosynthesis at [facility] 10/02/2015. This showed 2 possible masses in the  right breast as well as some suspicious calcifications. The left breast  was unremarkable. On 10/15/2015 she underwent right diagnostic mammography with tomography and right breast ultrasonography. The breast density was category B. Spot magnification views confirmed an area of pleomorphic calcifications measuring up to 5 cm in the upper half of the breast. Also in the upper outer right breast there were adjacent low-density masses without distortion or calcifications. On physical exam there was no palpable abnormality.   Targeted ultrasound of the right breast found the 2 "masses" in the right upper quadrant were benign cysts measuring 0.8 and 0.5 cm respectively. Biopsy of the area of calcification on 10/15/2015 showed ([id number]) invasive ductal carcinoma, E-cadherin positive, grade 2, estrogen receptor 90% positive, progesterone receptor 50% positive, both with strong staining intensity, with an MIB-1 of 70%, and no HER-2 amplification, the signals ratio being 1.18 and the number per cell 2.60.   Her subsequent history is as detailed below    PAST MEDICAL HISTORY: Past Medical History:  Diagnosis Date   Anxiety    Arthritis    Bone metastasis ([place])    Breast cancer of upper-inner quadrant of right female breast [facility]) oncologist-  dr [name]--- 04/ 2019 ER/PR+ Stage IV w/ metastatic disease to liver, total spine, lung, bone   dx 06/ 2017  right breast invasive ductal carcinoma, Stage IIB, ER/PR + (pT2 pN1), grade 1-- s/p  right breast lumpecotmy w/ sln bx's and bilateral breast reduction 11-28-2015/  left breast with reduction , dx invasive ductal ca, Grade 1 (pT1cpNX) ER/PR positive/  adjuvant radiation completed 03-27-2016 to right chest wall, 2018  reclassifiec Stage IIA   Cancer, metastatic to liver ([place])    Chronic back pain    Constipation    Depression    Family history of adverse reaction to anesthesia     mother has Copd was kept on vent    Headache    Heart murmur    History of hyperthyroidism    s/p  RAI   History of radiation therapy 02/11/16-  03/27/16   Right Chest Wall 50.4 Gy in 28 fractions, Right Chest Wall Boost 10 Gy in 5 fractions.    Hyperlipidemia    Hypertension    Hypothyroidism, postradioiodine therapy    endocrinoloigst-  dr balan--  2004  s/p  RAI   Menorrhagia 2011   Multiple pulmonary nodules    secondary  metastatic   PONV (postoperative nausea and vomiting)    patch helps    SUI (stress urinary incontinence, female)    Wears contact lenses    Wears dentures    full upper and lower partial    PAST SURGICAL HISTORY: Past Surgical History:  Procedure Laterality Date   BREAST LUMPECTOMY WITH NEEDLE LOCALIZATION AND AXILLARY SENTINEL LYMPH NODE BX Right 11/28/2015   Procedure: RIGHT BREAST LUMPECTOMY WITH NEEDLE LOCALIZATION AND AXILLARY SENTINEL LYMPH NODE BX;  Surgeon: Autumn Messing III, MD;  Location: [facility];  Service: General;  Laterality: Right;   BREAST REDUCTION SURGERY Bilateral 11/28/2015   Procedure: MAMMARY REDUCTION  (BREAST) BILATERAL;  Surgeon: Crissie Reese, MD;  Location: [facility] [place];  Service: Plastics;  Laterality: Bilateral;   CESAREAN SECTION  01/12/1993   COLONOSCOPY     Dental surgeries     FEMUR IM NAIL Left 01/28/2018   Procedure: INTRAMEDULLARY (IM) NAIL FEMORAL;  Surgeon: Nicholes Stairs, MD;  Location: [facility];  Service: Orthopedics;  Laterality: Left;   HYSTEROSCOPY N/A 09/02/2017   Procedure: HYSTEROSCOPY  to  remove IUD;  Surgeon: Eldred Manges, MD;  Location: [facility];  Service: Gynecology;  Laterality: N/A;   IR IMAGING GUIDED PORT INSERTION  09/16/2020   Liver biospy     MASTECTOMY     MASTECTOMY W/ SENTINEL NODE BIOPSY Bilateral 12/26/2015   Procedure: BILATERAL MASTECTOMY WITH LEFT SENTINEL LYMPH NODE BIOPSY;  Surgeon: Autumn Messing III, MD;  Location: [place];  Service: General;  Laterality: Bilateral;   Removal of IUD     TUBAL LIGATION Bilateral 1995    FAMILY HISTORY: Family History  Problem Relation Age of Onset   Diabetes Mother    Hypertension Mother     Hypertension Father    Diabetes Maternal Grandmother    Hypertension Sister    Cancer Paternal Grandmother        colon   Colon cancer Paternal Grandmother    Esophageal cancer Neg Hx    Stomach cancer Neg Hx    Rectal cancer Neg Hx   The patient's parents are still living, in their early 58s as of June 2017. The patient has one brother, 2 sisters. On the maternal side there is a history of uterine and prostate cancer. On the paternal side there is a history of colon cancer and possibly uterine cancer. There is no history of breast or ovarian cancer in the family.    GYNECOLOGIC HISTORY:  Patient's last menstrual period was 12/19/2015.  Menarche age 50. She still having regular periods. She is GX P1, first pregnancy age 89. She used oral contraceptives for a few years remotely, with no complications. She is currently on a Mirena IUD and also is status post bilateral tubal ligation.   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 Her husband Debbie [name] is a Administrator.  They are both taking appropriate precautions at work.  Their son Debbie [name], s a radio DJ.  The patient is expecting her first grandchild May 2023.  She is not a Ambulance person.   ADVANCED DIRECTIVES: In the absence of any documents to the contrary her husband is her healthcare power of attorney   HEALTH MAINTENANCE: Social History   Tobacco Use   Smoking status: Never   Smokeless tobacco: Never  Vaping Use   Vaping Use: Never used  Substance Use Topics   Alcohol use: No   Drug use: No     Colonoscopy: January 2016   PAP: January 2016   Bone density: August 2017 showed a T score of +0.7 normal  Lipid panel:  Allergies  Allergen Reactions   Ace Inhibitors Cough   Atorvastatin Other (See Comments)    Joint pain   Simvastatin Other (See Comments)    Joint pain    Current Outpatient Medications  Medication Sig Dispense Refill   acetaminophen (TYLENOL) 500 MG  tablet Take 1,000 mg by mouth every 6 (six) hours as needed.     Calcium-Magnesium 250-125 MG TABS Take 1 tablet by mouth daily. 120 each 4   cetirizine (ZYRTEC) 10 MG tablet Take 10 mg by mouth daily as needed for allergies.      chlorhexidine (PERIDEX) 0.12 % solution Use 5 mLs in the mouth or throat 2 (two) times daily as needed as directed. Do not swallow 473 mL 6   doxycycline (VIBRA-TABS) 100 MG tablet Take 1 tablet (100 mg total) by mouth daily. 30 tablet 2   furosemide (LASIX) 20 MG tablet Take 1 tablet (20 mg total) by mouth daily.  90 tablet 6   levothyroxine (SYNTHROID) 175 MCG tablet Take 1 tablet (175 mcg total) by mouth in the morning on an empty stomach 30 tablet 6   lidocaine-prilocaine (EMLA) cream Apply to affected area once 30 g 3   losartan-hydrochlorothiazide (HYZAAR) 100-25 MG tablet Take 1 tablet by mouth once a day 90 tablet 2   Multiple Vitamin (MULTIVITAMIN) tablet Take 1 tablet by mouth daily.     omega-3 acid ethyl esters (LOVAZA) 1 g capsule Take 1 g by mouth 2 (two) times daily.      omeprazole (PRILOSEC) 40 MG capsule Take 1 capsule (40 mg total) by mouth daily. 30 capsule 3   oxyCODONE (OXY IR/ROXICODONE) 5 MG immediate release tablet Take 1 tablet (5 mg total) by mouth every 4 (four) hours as needed for severe pain. 60 tablet 0   potassium chloride SA (KLOR-CON) 20 MEQ tablet Take 2 tablets (40 mEq total) by mouth daily. 90 tablet 4   pregabalin (LYRICA) 100 MG capsule Take 1 capsule (100 mg total) by mouth 3 (three) times daily. 90 capsule 0   prochlorperazine (COMPAZINE) 10 MG tablet Take 1 tablet (10 mg total) by mouth every 6 (six) hours as needed (Nausea or vomiting). 30 tablet 1   rosuvastatin (CRESTOR) 5 MG tablet Take 1 tablet (5 mg total) by mouth daily. 30 tablet 1   sertraline (ZOLOFT) 100 MG tablet TAKE 1 TABLET BY MOUTH ONCE A DAY 30 tablet 12   valACYclovir (VALTREX) 1000 MG tablet Take 1 tablet (1,000 mg total) by mouth daily. 60 tablet 6   Vitamin D,  Cholecalciferol, 1000 units CAPS Take 1 tablet by mouth daily. (Patient taking differently: Take 1,000 Units by mouth daily.) 90 capsule 4   No current facility-administered medications for this visit.    OBJECTIVE:  Vitals:   03/11/21 0817  BP: (!) 148/72  Pulse: 91  Resp: 18  Temp: 97.9 F (36.6 C)  SpO2: 100%     Body mass index is 48.03 kg/m.    ECOG FS:1 - Symptomatic but completely ambulatory  Filed Weights   03/11/21 0817  Weight: 297 lb 9.6 oz (135 kg)  GENERAL: Patient is a well appearing female in no acute distress HEENT:  Sclerae anicteric.  Oropharynx clear and moist. No ulcerations or evidence of oropharyngeal candidiasis. Neck is supple.  NODES:  No cervical, supraclavicular, or axillary lymphadenopathy palpated.  BREAST EXAM:  Deferred. LUNGS:  Clear to auscultation bilaterally.  No wheezes or rhonchi. HEART:  Regular rate and rhythm. No murmur appreciated. ABDOMEN:  Soft, nontender.  Positive, normoactive bowel sounds. No organomegaly palpated. MSK:  No focal spinal tenderness to palpation.  EXTREMITIES:  right arm lymphedema present. SKIN:  Clear with no obvious rashes or skin changes. No nail dyscrasia. NEURO:  Nonfocal. Well oriented.  Appropriate affect.    LAB RESULTS:  CMP     Component Value Date/Time   NA 142 02/25/2021 0803   NA 140 07/08/2016 0948   K 3.2 (L) 02/25/2021 0803   K 4.1 07/08/2016 0948   CL 102 02/25/2021 0803   CO2 27 02/25/2021 0803   CO2 24 07/08/2016 0948   GLUCOSE 103 (H) 02/25/2021 0803   GLUCOSE 89 07/08/2016 0948   BUN 14 02/25/2021 0803   BUN 11.9 07/08/2016 0948   CREATININE 0.86 02/25/2021 0803   CREATININE 0.84 08/22/2020 1050   CREATININE 0.9 07/08/2016 0948   CALCIUM 9.2 02/25/2021 0803   CALCIUM 10.0 07/08/2016 0948   PROT 6.9  02/25/2021 0803   PROT 7.6 07/08/2016 0948   ALBUMIN 3.3 (L) 02/25/2021 0803   ALBUMIN 3.6 07/08/2016 0948   AST 17 02/25/2021 0803   AST 16 08/22/2020 1050   AST 22 07/08/2016  0948   ALT 18 02/25/2021 0803   ALT 14 08/22/2020 1050   ALT 27 07/08/2016 0948   ALKPHOS 130 (H) 02/25/2021 0803   ALKPHOS 115 07/08/2016 0948   BILITOT 0.3 02/25/2021 0803   BILITOT 0.3 08/22/2020 1050   BILITOT 0.35 07/08/2016 0948   GFRNONAA >60 02/25/2021 0803   GFRNONAA >60 08/22/2020 1050   GFRAA >60 01/30/2020 0814   GFRAA >60 05/16/2019 0905    INo results found for: SPEP, UPEP  Lab Results  Component Value Date   WBC 4.9 03/11/2021   NEUTROABS 3.5 03/11/2021   HGB 10.5 (L) 03/11/2021   HCT 32.4 (L) 03/11/2021   MCV 90.3 03/11/2021   PLT 263 03/11/2021      Chemistry      Component Value Date/Time   NA 142 02/25/2021 0803   NA 140 07/08/2016 0948   K 3.2 (L) 02/25/2021 0803   K 4.1 07/08/2016 0948   CL 102 02/25/2021 0803   CO2 27 02/25/2021 0803   CO2 24 07/08/2016 0948   BUN 14 02/25/2021 0803   BUN 11.9 07/08/2016 0948   CREATININE 0.86 02/25/2021 0803   CREATININE 0.84 08/22/2020 1050   CREATININE 0.9 07/08/2016 0948      Component Value Date/Time   CALCIUM 9.2 02/25/2021 0803   CALCIUM 10.0 07/08/2016 0948   ALKPHOS 130 (H) 02/25/2021 0803   ALKPHOS 115 07/08/2016 0948   AST 17 02/25/2021 0803   AST 16 08/22/2020 1050   AST 22 07/08/2016 0948   ALT 18 02/25/2021 0803   ALT 14 08/22/2020 1050   ALT 27 07/08/2016 0948   BILITOT 0.3 02/25/2021 0803   BILITOT 0.3 08/22/2020 1050   BILITOT 0.35 07/08/2016 0948       No results found for: LABCA2  No components found for: LABCA125  No results for input(s): INR in the last 168 hours.  Urinalysis No results found for: COLORURINE, APPEARANCEUR, LABSPEC, PHURINE, GLUCOSEU, HGBUR, BILIRUBINUR, KETONESUR, PROTEINUR, UROBILINOGEN, NITRITE, LEUKOCYTESUR   STUDIES: No results found.   ELIGIBLE FOR AVAILABLE RESEARCH PROTOCOL: PALLAS, but inelegible because of Mirena IUD   ASSESSMENT: 55 y.o. [place] woman status post right breast upper inner quadrant biopsy 10/15/2015 for a clinical T2-T3  N0, stage II invasive ductal carcinoma, estrogen and progesterone receptor positive, HER-2 nonamplified, with an Mib-1 of 70%  (1) status post right lumpectomy and sentinel lymph node sampling with oncoplastic breast reduction 11/28/2015 for a pT2 pN1, stage IIB invasive ductal carcinoma, grade 1, with negative margins (1/1 sentinel node positive)  (a) reclassified as stage IIA in the 2018 prognostic revision   (2) left reduction mammoplasty 11/28/2015 unexpectedly found a pT1c pNX invasive ductal carcinoma, grade 1, estrogen and progesterone receptor positive, HER-2 not amplified, with an MIB-1 of 3%  (3) Mammaprint from the right sided breast cancer was "low risk", predicting a 98% chance of no disease recurrence within 5 years with anti-estrogens as the only systemic therapy. It also predicts minimal to no benefit from chemotherapy  (4) status post bilateral mastectomies with bilateral sentinel lymph node sampling 12/26/2015, showing  (a) on the right, no residual carcinoma (0/3 nodes involved)  (b) on the left, focal ductal carcinoma in situ, 0.2 cm, with negative margins; (0/4 nodes involved)  (5) adjuvant  radiation 02/11/16 - 03/27/16    1) Right chest wall: 50.4 Gy in 28 fractions               2) Right chest wall boost: 10 Gy in 5 fractions   (6) started on tamoxifen neoadjuvantly 10/23/2015 to allow for expected delays in definitive local treatment  (a) Mirena IUD in place  (b) tamoxifen discontinued 08/13/2017, with progression  (7) since she had 4 lymph nodes removed from each axilla but also receive radiation on the right, lab draws should preferentially be obtained from the left arm   METASTATIC DISEASE: April 2019 (8) CT scan of the abdomen and pelvis 08/04/2017, MRI of the liver and total spine MRI 08/11/2017 showed liver and bone metastases  (a) liver biopsy 09/07/2017 confirms estrogen receptor positive, progesterone receptor negative, HER-2 negative metastatic breast  cancer  (b) chest CT scan 08/13/2017 shows multiple bilateral pulmonary nodules.  (c) CT of the brain with and without contrast 08/26/2017 shows no evidence of metastatic disease to the brain  (d) CA 27-29 is informative  (9) fulvestrant started 08/17/2017  (a) palbociclib started 08/31/2017  (b) fulvestrant and palbociclib discontinued 01/04/2018 with evidence of progression in the liver  (10) denosumab/Xgeva started 09/14/2017  (a) held after 03/01/2018 dose because concern re. osteonecrosis  (b) resumed 06/09/2018  (c) 02/21/2019 dose held due to oral surgery, resumed 03/21/2019  (11) anastrozole started 01/04/2018  (a) everolimus started 01/29/2018  (b) liver MRI and bone scan December 2019 show stable disease  (c) chest CT scan on 08/02/2018 shows lung lesions resolved, bone lesions stable  (d) bone scan 09/13/2018 essentially stable  (e) liver MRI 12/08/2018 shows improvement in hepatic metastsases  (f) liver MRI and bone scan 04/13/2019 showed continuing improvement in the liver, stable bone lesions  (g) chest CT 08/01/2019 stable, no evidence of active disease ows  (12) 01/28/18 prophylactic left femur intramedullary nail surgery for impending left femur pathologic fracture  (a) palliative radiation with Dr. [name] to the left femur and left ilium from 02/16/2018 to 03/01/2018  (13) [facility] report on liver biopsy from April 2019 shows a negative PDL 1, stable MSI and proficient mismatch repair status.  The tumor is positive for the androgen receptor, and for PTEN for PIK3 CA mutations.  HER-2/neu was read as equivocal (it was negative by FISH on the pathology report).  (14) Progression noted in liver MRI on 01/11/2020; fulvestrant started on 01/30/2020  (a) alpelisib started 01/30/2020 at 300 mg daily with Metformin 500 mg daily  (b) alpelisib held on 02/07/2020 with very high blood sugars.  Metformin increased to 1 g daily  (c) alpelisib resumed at 150 mg daily on 02/13/2020  (d)  liver MRI 05/06/2020 shows evidence of disease progression  (e) fulvestrant and alpelisib discontinued January 2022  (15) capecitabine started 05/22/2020 at 1.5 g twice daily, 14/7  (a) abdominal MRI 08/23/2020 shows worsening liver disease  (b) chest CT scan 09/04/2020 shows new lung metastases  (c) capecitabine discontinued April 2022  (16) paclitaxel started 09/10/2020, repeated days 1, 8, and 15 of each 28-day cycle  (A) changed to every 2 weeks beginning with cycle 3 (11/05/2020)  (B) MRI of the abdomen 02/09/2021 shows evidence of response  (C) chest CT 02/10/2021 shows stable disease   PLAN: Mkayla continues on treatment with every other week paclitaxel.  She continues to tolerate this well.  She does not have any clinical signs of progression of her cancer.  Her labs today are stable and  I reviewed those with her in detail.  She will proceed with her therapy today.  We discussed the issue of her nails.  I encouraged her to start painting them however she would like.  She does have some darkening at her nailbeds, but there is no weakness noted to her nails and this should not be an issue.  Debbie [name] is due for restaging scans to be completed on December 1.  These were ordered 3 weeks ago and have not yet been scheduled.  I have asked my nurse to look into this.  We would like to have these done before her follow-up with Dr. [name] which is scheduled for April 15, 2021.  Irja will return in 2 weeks for labs and her paclitaxel.  We will see her in 4 weeks.  She knows to call for any questions or concerns that may arise between now and her next appointment, we are always happy to see her sooner if needed.  Total encounter time 20 minutes.*In face-to-face visit time chart review lab review order entry care coordination and documentation of the encounter.  Wilber Bihari, NP 03/11/21 8:43 AM Medical Oncology and Hematology [facility] [place],  [place] [address] Tel. [phone]    Fax. [phone]    *Total Encounter Time as defined by the Centers for Medicare and Medicaid Services includes, in addition to the face-to-face time of a patient visit (documented in the note above) non-face-to-face time: obtaining and reviewing outside history, ordering and reviewing medications, tests or procedures, care coordination (communications with other health care professionals or caregivers) and documentation in the medical record.

## 2021-03-25 NOTE — Patient Instructions (Signed)
[facility] ONCOLOGY  Discharge Instructions: Thank you for choosing [facility] to provide your oncology and hematology care.   If you have a lab appointment with the [facility], please go directly to the [facility] and check in at the registration area.   Wear comfortable clothing and clothing appropriate for easy access to any Portacath or PICC line.   We strive to give you quality time with your provider. You may need to reschedule your appointment if you arrive late (15 or more minutes).  Arriving late affects you and other patients whose appointments are after yours.  Also, if you miss three or more appointments without notifying the office, you may be dismissed from the clinic at the provider's discretion.      For prescription refill requests, have your pharmacy contact our office and allow 72 hours for refills to be completed.    Today you received the following chemotherapy and/or immunotherapy agents Paclitaxel      To help prevent nausea and vomiting after your treatment, we encourage you to take your nausea medication as directed.  BELOW ARE SYMPTOMS THAT SHOULD BE REPORTED IMMEDIATELY: *FEVER GREATER THAN 100.4 F (38 C) OR HIGHER *CHILLS OR SWEATING *NAUSEA AND VOMITING THAT IS NOT CONTROLLED WITH YOUR NAUSEA MEDICATION *UNUSUAL SHORTNESS OF BREATH *UNUSUAL BRUISING OR BLEEDING *URINARY PROBLEMS (pain or burning when urinating, or frequent urination) *BOWEL PROBLEMS (unusual diarrhea, constipation, pain near the anus) TENDERNESS IN MOUTH AND THROAT WITH OR WITHOUT PRESENCE OF ULCERS (sore throat, sores in mouth, or a toothache) UNUSUAL RASH, SWELLING OR PAIN  UNUSUAL VAGINAL DISCHARGE OR ITCHING   Items with * indicate a potential emergency and should be followed up as soon as possible or go to the Emergency Department if any problems should occur.  Please show the CHEMOTHERAPY ALERT CARD or IMMUNOTHERAPY ALERT CARD at check-in to  the Emergency Department and triage nurse.  Should you have questions after your visit or need to cancel or reschedule your appointment, please contact [facility]  Dept: [phone]  and follow the prompts.  Office hours are 8:00 a.m. to 4:30 p.m. Monday - Friday. Please note that voicemails left after 4:00 p.m. may not be returned until the following business day.  We are closed weekends and major holidays. You have access to a nurse at all times for urgent questions. Please call the main number to the clinic Dept: [phone] and follow the prompts.   For any non-urgent questions, you may also contact your provider using MyChart. We now offer e-Visits for anyone 59 and older to request care online for non-urgent symptoms. For details visit [email or web address].   Also download the MyChart app! Go to the app store, search "MyChart", open the app, select [facility], and log in with your MyChart username and password.  Due to Covid, a mask is required upon entering the hospital/clinic. If you do not have a mask, one will be given to you upon arrival. For doctor visits, patients may have 1 support person aged 12 or older with them. For treatment visits, patients cannot have anyone with them due to current Covid guidelines and our immunocompromised population.

## 2021-03-26 LAB — CANCER ANTIGEN 27.29: CA 27.29: 199.6 U/mL — ABNORMAL HIGH (ref 0.0–38.6)

## 2021-03-27 ENCOUNTER — Other Ambulatory Visit: Payer: Self-pay

## 2021-03-27 ENCOUNTER — Ambulatory Visit: Payer: 59

## 2021-03-27 DIAGNOSIS — I972 Postmastectomy lymphedema syndrome: Secondary | ICD-10-CM | POA: Diagnosis not present

## 2021-03-27 DIAGNOSIS — C7800 Secondary malignant neoplasm of unspecified lung: Secondary | ICD-10-CM | POA: Diagnosis not present

## 2021-03-27 DIAGNOSIS — R293 Abnormal posture: Secondary | ICD-10-CM

## 2021-03-27 DIAGNOSIS — M6281 Muscle weakness (generalized): Secondary | ICD-10-CM

## 2021-03-27 DIAGNOSIS — M545 Low back pain, unspecified: Secondary | ICD-10-CM

## 2021-03-27 DIAGNOSIS — C50919 Malignant neoplasm of unspecified site of unspecified female breast: Secondary | ICD-10-CM

## 2021-03-27 DIAGNOSIS — G8929 Other chronic pain: Secondary | ICD-10-CM | POA: Diagnosis not present

## 2021-03-27 NOTE — Therapy (Signed)
[facility] @ [address] [place], [place], [address] Phone: [phone]   Fax:  [phone]  Physical Therapy Treatment  Patient Details  Name: Debbie [name] MRN: [id number] Date of Birth: [birth year] Referring Provider (PT): Dr. [name]   Encounter Date: 03/27/2021   PT End of Session - 03/27/21 0909     Visit Number 13    Number of Visits 17    Date for PT Re-Evaluation 04/10/21    PT Start Time 0901    PT Stop Time 0953    PT Time Calculation (min) 52 min    Activity Tolerance Patient tolerated treatment well    Behavior During Therapy [facility] for tasks assessed/performed             Past Medical History:  Diagnosis Date   Anxiety    Arthritis    Bone metastasis ([place])    Breast cancer of upper-inner quadrant of right female breast [facility]) oncologist-  dr [name]--- 04/ 2019 ER/PR+ Stage IV w/ metastatic disease to liver, total spine, lung, bone   dx 06/ 2017  right breast invasive ductal carcinoma, Stage IIB, ER/PR + (pT2 pN1), grade 1-- s/p  right breast lumpecotmy w/ sln bx's and bilateral breast reduction 11-28-2015/  left breast with reduction , dx invasive ductal ca, Grade 1 (pT1cpNX) ER/PR positive/  adjuvant radiation completed 03-27-2016 to right chest wall, 2018  reclassifiec Stage IIA   Cancer, metastatic to liver ([facility])    Chronic back pain    Constipation    Depression    Family history of adverse reaction to anesthesia     mother has Copd was kept on vent    Headache    Heart murmur    History of hyperthyroidism    s/p  RAI   History of radiation therapy 02/11/16- 03/27/16   Right Chest Wall 50.4 Gy in 28 fractions, Right Chest Wall Boost 10 Gy in 5 fractions.    Hyperlipidemia    Hypertension    Hypothyroidism, postradioiodine therapy    endocrinoloigst-  dr balan--  2004  s/p  RAI   Menorrhagia 2011   Multiple pulmonary nodules    secondary  metastatic   PONV (postoperative nausea and  vomiting)    patch helps    SUI (stress urinary incontinence, female)    Wears contact lenses    Wears dentures    full upper and lower partial    Past Surgical History:  Procedure Laterality Date   BREAST LUMPECTOMY WITH NEEDLE LOCALIZATION AND AXILLARY SENTINEL LYMPH NODE BX Right 11/28/2015   Procedure: RIGHT BREAST LUMPECTOMY WITH NEEDLE LOCALIZATION AND AXILLARY SENTINEL LYMPH NODE BX;  Surgeon: Autumn Messing III, MD;  Location: [facility];  Service: General;  Laterality: Right;   BREAST REDUCTION SURGERY Bilateral 11/28/2015   Procedure: MAMMARY REDUCTION  (BREAST) BILATERAL;  Surgeon: Crissie Reese, MD;  Location: [facility];  Service: Plastics;  Laterality: Bilateral;   CESAREAN SECTION  01/12/1993   COLONOSCOPY     Dental surgeries     FEMUR IM NAIL Left 01/28/2018   Procedure: INTRAMEDULLARY (IM) NAIL FEMORAL;  Surgeon: Nicholes Stairs, MD;  Location: [facility];  Service: Orthopedics;  Laterality: Left;   HYSTEROSCOPY N/A 09/02/2017   Procedure: HYSTEROSCOPY  to remove IUD;  Surgeon: Eldred Manges, MD;  Location: [facility];  Service: Gynecology;  Laterality: N/A;   IR IMAGING GUIDED PORT INSERTION  09/16/2020   Liver biospy  MASTECTOMY     MASTECTOMY W/ SENTINEL NODE BIOPSY Bilateral 12/26/2015   Procedure: BILATERAL MASTECTOMY WITH LEFT SENTINEL LYMPH NODE BIOPSY;  Surgeon: Autumn Messing III, MD;  Location: [facility];  Service: General;  Laterality: Bilateral;   Removal of IUD     TUBAL LIGATION Bilateral 1995    There were no vitals filed for this visit.   Subjective Assessment - 03/27/21 0902     Subjective When I left last time I was OK.  I was more tired so I went home and rested. My back pain was no worse after last visit. Able to bend to cut my toes nails better, Can turn better without increased pain.    Pertinent History Stage IV breast Cancer s/p Bilateral Mastectomies with METS to Liver, spine, lung, Bone.  Has left  Femur IM rod.  Presently on Paclitaxel Day 1  and 15 of 28 day cycle.  Stable Right UE lymphedema. 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 unlimted, stiff when she rises    How long can you stand comfortably? 5-10 min then has to sit or lean    How long can you walk comfortably? several hundred yards.    Patient Stated Goals Decrease pain, improve mobility to perform activities better    Currently in Pain? Yes    Pain Score 2     Pain Location Back    Pain Orientation Left;Lower    Pain Descriptors / Indicators Other (Comment)   stiffness   Pain Onset More than a month ago    Pain Frequency Intermittent                                  [facility] Adult PT Treatment/Exercise - 03/27/21 0001       Neuro Re-ed    Neuro Re-ed Details  standing on AX;eyes closed 2 x 30, step up on ax forward and lateral x 5 ea side      Lumbar Exercises: Stretches   Other Lumbar Stretch Exercise pelvic rocking and lateral shifting seated on mat table x 10 ea, supine on wedge ab set with alternate arm raises 2 x 5      Lumbar Exercises: Aerobic   Nustep Lev 2 for 10 min, seat 9 LE's only,      Lumbar Exercises: Supine   Clam 10 reps    Basic Lumbar Stabilization Limitations ball squeeze x 10 3"    Other Supine Lumbar Exercises supine ab set with heel slides 2 x 5      Knee/Hip Exercises: Standing   Other Standing Knee Exercises heel raises x 2x 10   holding walker   Other Standing Knee Exercises sidestepping 7 steps 2 laps      Knee/Hip Exercises: Seated   Long Arc Quad Strengthening;Right;Left;2 sets;5 sets   2#   Marching Left;2 sets;5 reps                          PT Long Term Goals - 02/13/21 1407       PT LONG TERM GOAL #1   Title Pt will be independent with HEP for lumbar, core and generalized strength    Time 6    Period Weeks    Status On-going      PT LONG TERM GOAL #2   Title Pt will have  LBP decreased by atleast 50%    Baseline 02/13/21- 50%  improvement    Time 6    Period Weeks    Status Achieved      PT LONG TERM GOAL #3   Title pt will be able to shower without sitting on shower chair    Baseline 02/13/21- pt still requires a shower chair because at the end of the day her back hurts    Time 6    Period Weeks    Status On-going      PT LONG TERM GOAL #4   Title pt will be able to wash dishes for 10-20 minutes with minimal complaints of LBP    Baseline 02/13/21- pt reports this has reamained about the same, she needs to lean on the sink    Time 6    Period Weeks    Status On-going      PT LONG TERM GOAL #5   Title pt will be able to rise from chair without pain or stiffness    Baseline 02/13/21- pt is able to do this    Time 6    Period Weeks    Status Achieved                   Plan - 03/27/21 0910     Clinical Impression Statement Pt did well with exercises today and they were rotated between standing, sitting, supine on wedge and standing again.  Main complaint was of tightness in LB but not increased pain.  She performed supine exs on the wedge to see if it would feel better.  She really couldn't tell much difference but prefers not to really lie flat. She did get some relief of stiffness with sitting pelvic rocks and lateral shifting. She came in without compression garments today because she was running late.    Personal Factors and Comorbidities Comorbidity 3+    Comorbidities Bilateral mastectomies for Breast CA with radiation, Bone and liver METS, right UE lymphedema    Examination-Activity Limitations Bend;Hygiene/Grooming;Stand;Lift    Stability/Clinical Decision Making Evolving/Moderate complexity    Rehab Potential Good    PT Frequency 1x / week    PT Duration 8 weeks    PT Treatment/Interventions Aquatic Therapy;ADLs/Self Care Home Management;Therapeutic exercise;Neuromuscular re-education;Manual techniques;Patient/family education;Therapeutic activities    PT Next Visit Plan advance core  strengthening and aerboic capacity challenges, continue improving core strength    PT Home Exercise Plan core control and strengthening for improved posture    Consulted and Agree with Plan of Care Patient             Patient will benefit from skilled therapeutic intervention in order to improve the following deficits and impairments:  Difficulty walking, Pain, Decreased knowledge of precautions, Decreased activity tolerance, Postural dysfunction, Decreased strength, Decreased mobility  Visit Diagnosis: Chronic bilateral low back pain without sciatica  Abnormal posture  Muscle weakness (generalized)  Primary malignant neoplasm of breast with metastasis to other site, unspecified laterality (HCC)  Malignant neoplasm metastatic to lung, unspecified laterality [facility])     Problem List Patient Active Problem List   Diagnosis Date Noted   Encounter for antineoplastic chemotherapy 10/08/2020   Lower extremity edema 10/08/2020   Lung metastases ([place]) 09/10/2020   Goals of care, counseling/discussion 12/27/2018   Osteonecrosis ([facility]) 11/01/2018   Pathological fracture in neoplastic disease, left femur, initial encounter for fracture ([facility]) 01/28/2018   Morbid obesity with BMI of 40.0-44.9, adult ([facility]) 71/69/6789   IUD complication ([place])  09/01/2017   Bone metastases ([facility]) 08/13/2017   Pain from bone metastases ([place]) 08/13/2017   Liver metastases ([facility]) 08/13/2017   Steatosis of liver 08/13/2017   Bilateral malignant neoplasm of breast in female, estrogen receptor positive ([facility]) 12/26/2015   Malignant neoplasm of central portion of left breast in female, estrogen receptor positive ([facility]) 11/28/2015   Malignant neoplasm of upper-inner quadrant of right breast in female, estrogen receptor positive ([facility]) 10/17/2015   Hypertension 04/19/2012   Hypothyroid 04/19/2012   Fibroids 04/19/2012   Stress incontinence, female 04/19/2012    Claris Pong, PT 03/27/2021, 9:59 AM  [facility] @ [place] [address] [place], [place], [address] Phone: [phone]   Fax:  [phone]  Name: Debbie [name] MRN: [id number] Date of Birth: [birth year]

## 2021-04-01 ENCOUNTER — Ambulatory Visit (HOSPITAL_COMMUNITY)
Admission: RE | Admit: 2021-04-01 | Discharge: 2021-04-01 | Disposition: A | Discharge status: Home / Self Care | Payer: 59 | Source: Ambulatory Visit | Attending: Adult Health | Admitting: Adult Health

## 2021-04-01 ENCOUNTER — Ambulatory Visit (HOSPITAL_BASED_OUTPATIENT_CLINIC_OR_DEPARTMENT_OTHER): Payer: 59 | Admitting: Physical Therapy

## 2021-04-01 DIAGNOSIS — C50112 Malignant neoplasm of central portion of left female breast: Secondary | ICD-10-CM | POA: Diagnosis not present

## 2021-04-01 DIAGNOSIS — Z170 Estrogen receptor positive status [ER+]: Secondary | ICD-10-CM | POA: Diagnosis not present

## 2021-04-01 DIAGNOSIS — Z853 Personal history of malignant neoplasm of breast: Secondary | ICD-10-CM | POA: Diagnosis not present

## 2021-04-01 DIAGNOSIS — C7800 Secondary malignant neoplasm of unspecified lung: Secondary | ICD-10-CM

## 2021-04-01 DIAGNOSIS — C787 Secondary malignant neoplasm of liver and intrahepatic bile duct: Secondary | ICD-10-CM | POA: Diagnosis not present

## 2021-04-01 DIAGNOSIS — C7951 Secondary malignant neoplasm of bone: Secondary | ICD-10-CM | POA: Diagnosis not present

## 2021-04-01 DIAGNOSIS — R918 Other nonspecific abnormal finding of lung field: Secondary | ICD-10-CM | POA: Diagnosis not present

## 2021-04-01 DIAGNOSIS — R911 Solitary pulmonary nodule: Secondary | ICD-10-CM | POA: Diagnosis not present

## 2021-04-01 DIAGNOSIS — K760 Fatty (change of) liver, not elsewhere classified: Secondary | ICD-10-CM | POA: Diagnosis not present

## 2021-04-01 MED ORDER — GADOBUTROL 1 MMOL/ML IV SOLN
10.0000 mL | Freq: Once | INTRAVENOUS | Status: AC | PRN
  Administered 2021-04-01: 10 mL via INTRAVENOUS

## 2021-04-01 MED ORDER — IOHEXOL 350 MG/ML SOLN
60.0000 mL | Freq: Once | INTRAVENOUS | Status: AC | PRN
  Administered 2021-04-01: 60 mL via INTRAVENOUS

## 2021-04-02 ENCOUNTER — Ambulatory Visit (HOSPITAL_COMMUNITY): Payer: 59

## 2021-04-07 ENCOUNTER — Ambulatory Visit (HOSPITAL_COMMUNITY): Payer: 59

## 2021-04-08 ENCOUNTER — Inpatient Hospital Stay: Payer: 59

## 2021-04-08 ENCOUNTER — Other Ambulatory Visit: Payer: Self-pay

## 2021-04-08 ENCOUNTER — Other Ambulatory Visit (HOSPITAL_COMMUNITY): Payer: Self-pay

## 2021-04-08 ENCOUNTER — Telehealth: Payer: Self-pay | Admitting: Adult Health

## 2021-04-08 VITALS — BP 130/74 | HR 86 | Temp 98.1°F | Resp 18 | Ht 66.0 in | Wt 294.0 lb

## 2021-04-08 DIAGNOSIS — Z170 Estrogen receptor positive status [ER+]: Secondary | ICD-10-CM

## 2021-04-08 DIAGNOSIS — Z9013 Acquired absence of bilateral breasts and nipples: Secondary | ICD-10-CM | POA: Diagnosis not present

## 2021-04-08 DIAGNOSIS — Z5111 Encounter for antineoplastic chemotherapy: Secondary | ICD-10-CM | POA: Diagnosis not present

## 2021-04-08 DIAGNOSIS — C50211 Malignant neoplasm of upper-inner quadrant of right female breast: Secondary | ICD-10-CM | POA: Diagnosis not present

## 2021-04-08 DIAGNOSIS — C7951 Secondary malignant neoplasm of bone: Secondary | ICD-10-CM | POA: Diagnosis not present

## 2021-04-08 DIAGNOSIS — Z79899 Other long term (current) drug therapy: Secondary | ICD-10-CM | POA: Diagnosis not present

## 2021-04-08 DIAGNOSIS — C787 Secondary malignant neoplasm of liver and intrahepatic bile duct: Secondary | ICD-10-CM

## 2021-04-08 DIAGNOSIS — Z923 Personal history of irradiation: Secondary | ICD-10-CM | POA: Diagnosis not present

## 2021-04-08 DIAGNOSIS — C50112 Malignant neoplasm of central portion of left female breast: Secondary | ICD-10-CM

## 2021-04-08 DIAGNOSIS — Z95828 Presence of other vascular implants and grafts: Secondary | ICD-10-CM

## 2021-04-08 LAB — CBC WITH DIFFERENTIAL/PLATELET
Abs Immature Granulocytes: 0.02 10*3/uL (ref 0.00–0.07)
Basophils Absolute: 0 10*3/uL (ref 0.0–0.1)
Basophils Relative: 0 %
Eosinophils Absolute: 0.1 10*3/uL (ref 0.0–0.5)
Eosinophils Relative: 3 %
HCT: 33.2 % — ABNORMAL LOW (ref 36.0–46.0)
Hemoglobin: 10.6 g/dL — ABNORMAL LOW (ref 12.0–15.0)
Immature Granulocytes: 0 %
Lymphocytes Relative: 13 %
Lymphs Abs: 0.7 10*3/uL (ref 0.7–4.0)
MCH: 28.6 pg (ref 26.0–34.0)
MCHC: 31.9 g/dL (ref 30.0–36.0)
MCV: 89.5 fL (ref 80.0–100.0)
Monocytes Absolute: 0.5 10*3/uL (ref 0.1–1.0)
Monocytes Relative: 9 %
Neutro Abs: 4.1 10*3/uL (ref 1.7–7.7)
Neutrophils Relative %: 75 %
Platelets: 254 10*3/uL (ref 150–400)
RBC: 3.71 MIL/uL — ABNORMAL LOW (ref 3.87–5.11)
RDW: 18.5 % — ABNORMAL HIGH (ref 11.5–15.5)
WBC: 5.5 10*3/uL (ref 4.0–10.5)
nRBC: 0 % (ref 0.0–0.2)

## 2021-04-08 LAB — CMP (CANCER CENTER ONLY)
ALT: 21 U/L (ref 0–44)
AST: 22 U/L (ref 15–41)
Albumin: 3.5 g/dL (ref 3.5–5.0)
Alkaline Phosphatase: 114 U/L (ref 38–126)
Anion gap: 6 (ref 5–15)
BUN: 11 mg/dL (ref 6–20)
CO2: 30 mmol/L (ref 22–32)
Calcium: 8.9 mg/dL (ref 8.9–10.3)
Chloride: 102 mmol/L (ref 98–111)
Creatinine: 0.83 mg/dL (ref 0.44–1.00)
GFR, Estimated: >60 mL/min (ref >60)
Glucose, Bld: 98 mg/dL (ref 70–99)
Potassium: 3.4 mmol/L — ABNORMAL LOW (ref 3.5–5.1)
Sodium: 138 mmol/L (ref 135–145)
Total Bilirubin: 0.5 mg/dL (ref 0.3–1.2)
Total Protein: 7 g/dL (ref 6.5–8.1)

## 2021-04-08 MED ORDER — SODIUM CHLORIDE 0.9 % IV SOLN: Freq: Once | INTRAVENOUS | Status: AC

## 2021-04-08 MED ORDER — DIPHENHYDRAMINE HCL 50 MG/ML IJ SOLN
25.0000 mg | Freq: Once | INTRAMUSCULAR | Status: AC
  Administered 2021-04-08: 25 mg via INTRAVENOUS

## 2021-04-08 MED ORDER — SODIUM CHLORIDE 0.9 % IV SOLN
80.0000 mg/m2 | Freq: Once | INTRAVENOUS | Status: AC
  Administered 2021-04-08: 192 mg via INTRAVENOUS
  Filled 2021-04-08: qty 32

## 2021-04-08 MED ORDER — DENOSUMAB 120 MG/1.7ML ~~LOC~~ SOLN
120.0000 mg | Freq: Once | SUBCUTANEOUS | Status: AC
  Administered 2021-04-08: 120 mg via SUBCUTANEOUS

## 2021-04-08 MED ORDER — SODIUM CHLORIDE 0.9 % IV SOLN
10.0000 mg | Freq: Once | INTRAVENOUS | Status: AC
  Administered 2021-04-08: 10 mg via INTRAVENOUS
  Filled 2021-04-08: qty 10

## 2021-04-08 MED ORDER — SODIUM CHLORIDE 0.9% FLUSH
10.0000 mL | INTRAVENOUS | Status: DC | PRN
  Administered 2021-04-08: 10 mL

## 2021-04-08 MED ORDER — HEPARIN SOD (PORK) LOCK FLUSH 100 UNIT/ML IV SOLN
500.0000 [IU] | Freq: Once | INTRAVENOUS | Status: AC | PRN
  Administered 2021-04-08: 500 [IU]

## 2021-04-08 MED ORDER — SODIUM CHLORIDE 0.9% FLUSH
10.0000 mL | INTRAVENOUS | Status: AC | PRN
  Administered 2021-04-08: 10 mL

## 2021-04-08 MED ORDER — FAMOTIDINE 20 MG IN NS 100 ML IVPB
20.0000 mg | Freq: Once | INTRAVENOUS | Status: AC
  Administered 2021-04-08: 20 mg via INTRAVENOUS

## 2021-04-08 MED FILL — Sertraline HCl Tab 100 MG: ORAL | 30 days supply | Qty: 30 | Fill #7 | Status: AC

## 2021-04-08 NOTE — Telephone Encounter (Signed)
I spoke with Debbie [name] about her imaging results.  I apologized for the delay since I was out of the office when she underwent these.  I reviewed these with her in detail.  She notes that she has lost some weight since her last scan intentionally.  This could possibly be a reason for the increased visualization of the liver lesions on the scan.  I reviewed her CT chest her pulmonary nodules are still quite small and may be have increased by millimeter in size.  This I think should be followed in a few months.  Regardless, I forwarded these results to Dr. [name] for his review.  He sees Debbie [name] on April 15, 2021.  I reviewed the above in detail with Debbie [name] and she is in agreement with the plan.

## 2021-04-08 NOTE — Patient Instructions (Signed)
[facility] ONCOLOGY  Discharge Instructions: Thank you for choosing [facility] to provide your oncology and hematology care.   If you have a lab appointment with the [facility], please go directly to the [facility] and check in at the registration area.   Wear comfortable clothing and clothing appropriate for easy access to any Portacath or PICC line.   We strive to give you quality time with your provider. You may need to reschedule your appointment if you arrive late (15 or more minutes).  Arriving late affects you and other patients whose appointments are after yours.  Also, if you miss three or more appointments without notifying the office, you may be dismissed from the clinic at the provider's discretion.      For prescription refill requests, have your pharmacy contact our office and allow 72 hours for refills to be completed.    Today you received the following chemotherapy and/or immunotherapy agents: Paclitaxel      To help prevent nausea and vomiting after your treatment, we encourage you to take your nausea medication as directed.  BELOW ARE SYMPTOMS THAT SHOULD BE REPORTED IMMEDIATELY: *FEVER GREATER THAN 100.4 F (38 C) OR HIGHER *CHILLS OR SWEATING *NAUSEA AND VOMITING THAT IS NOT CONTROLLED WITH YOUR NAUSEA MEDICATION *UNUSUAL SHORTNESS OF BREATH *UNUSUAL BRUISING OR BLEEDING *URINARY PROBLEMS (pain or burning when urinating, or frequent urination) *BOWEL PROBLEMS (unusual diarrhea, constipation, pain near the anus) TENDERNESS IN MOUTH AND THROAT WITH OR WITHOUT PRESENCE OF ULCERS (sore throat, sores in mouth, or a toothache) UNUSUAL RASH, SWELLING OR PAIN  UNUSUAL VAGINAL DISCHARGE OR ITCHING   Items with * indicate a potential emergency and should be followed up as soon as possible or go to the Emergency Department if any problems should occur.  Please show the CHEMOTHERAPY ALERT CARD or IMMUNOTHERAPY ALERT CARD at check-in to  the Emergency Department and triage nurse.  Should you have questions after your visit or need to cancel or reschedule your appointment, please contact [facility]  Dept: [phone]  and follow the prompts.  Office hours are 8:00 a.m. to 4:30 p.m. Monday - Friday. Please note that voicemails left after 4:00 p.m. may not be returned until the following business day.  We are closed weekends and major holidays. You have access to a nurse at all times for urgent questions. Please call the main number to the clinic Dept: [phone] and follow the prompts.   For any non-urgent questions, you may also contact your provider using MyChart. We now offer e-Visits for anyone 40 and older to request care online for non-urgent symptoms. For details visit [email or web address].   Also download the MyChart app! Go to the app store, search "MyChart", open the app, select [facility], and log in with your MyChart username and password.  Due to Covid, a mask is required upon entering the hospital/clinic. If you do not have a mask, one will be given to you upon arrival. For doctor visits, patients may have 1 support person aged 62 or older with them. For treatment visits, patients cannot have anyone with them due to current Covid guidelines and our immunocompromised population.

## 2021-04-08 NOTE — Progress Notes (Signed)
Pt declined icing hands and feet during Taxol infusion.

## 2021-04-09 ENCOUNTER — Other Ambulatory Visit: Payer: Self-pay | Admitting: Oncology

## 2021-04-09 LAB — CANCER ANTIGEN 27.29: CA 27.29: 242.6 U/mL — ABNORMAL HIGH (ref 0.0–38.6)

## 2021-04-09 NOTE — Progress Notes (Signed)
I called Debbie [name] and let her know that I would not change her treatment on the basis of a rising CA 27-29 only.  There is really been no measurable change on the scan.  We will do a repeat scan in 2 months instead of 3 though in terms of follow-up because of the rising CA 27-29.

## 2021-04-10 ENCOUNTER — Other Ambulatory Visit (HOSPITAL_COMMUNITY): Payer: Self-pay

## 2021-04-10 ENCOUNTER — Ambulatory Visit: Payer: 59 | Attending: Hematology and Oncology

## 2021-04-10 ENCOUNTER — Other Ambulatory Visit: Payer: Self-pay

## 2021-04-10 DIAGNOSIS — R293 Abnormal posture: Secondary | ICD-10-CM | POA: Insufficient documentation

## 2021-04-10 DIAGNOSIS — G8929 Other chronic pain: Secondary | ICD-10-CM | POA: Diagnosis not present

## 2021-04-10 DIAGNOSIS — C7800 Secondary malignant neoplasm of unspecified lung: Secondary | ICD-10-CM | POA: Diagnosis not present

## 2021-04-10 DIAGNOSIS — M545 Low back pain, unspecified: Secondary | ICD-10-CM | POA: Diagnosis not present

## 2021-04-10 DIAGNOSIS — C50919 Malignant neoplasm of unspecified site of unspecified female breast: Secondary | ICD-10-CM | POA: Insufficient documentation

## 2021-04-10 DIAGNOSIS — M6281 Muscle weakness (generalized): Secondary | ICD-10-CM | POA: Insufficient documentation

## 2021-04-10 NOTE — Therapy (Signed)
[place] @ [facility] [place], [place], [address] Phone: [phone]   Fax:  [phone]  Physical Therapy Treatment  Patient Details  Name: Debbie [name] MRN: [id number] Date of Birth: March 31, 1966 Referring Provider (PT): Dr. [name]   Encounter Date: 04/10/2021   PT End of Session - 04/10/21 0937     Visit Number 14    Number of Visits 17    Date for PT Re-Evaluation 04/10/21    PT Start Time 0900    PT Stop Time 0946    PT Time Calculation (min) 46 min    Activity Tolerance Patient tolerated treatment well    Behavior During Therapy [facility] for tasks assessed/performed             Past Medical History:  Diagnosis Date   Anxiety    Arthritis    Bone metastasis ([place])    Breast cancer of upper-inner quadrant of right female breast [facility]) oncologist-  dr [name]--- 04/ 2019 ER/PR+ Stage IV w/ metastatic disease to liver, total spine, lung, bone   dx 06/ 2017  right breast invasive ductal carcinoma, Stage IIB, ER/PR + (pT2 pN1), grade 1-- s/p  right breast lumpecotmy w/ sln bx's and bilateral breast reduction 11-28-2015/  left breast with reduction , dx invasive ductal ca, Grade 1 (pT1cpNX) ER/PR positive/  adjuvant radiation completed 03-27-2016 to right chest wall, 2018  reclassifiec Stage IIA   Cancer, metastatic to liver ([place])    Chronic back pain    Constipation    Depression    Family history of adverse reaction to anesthesia     mother has Copd was kept on vent    Headache    Heart murmur    History of hyperthyroidism    s/p  RAI   History of radiation therapy 02/11/16- 03/27/16   Right Chest Wall 50.4 Gy in 28 fractions, Right Chest Wall Boost 10 Gy in 5 fractions.    Hyperlipidemia    Hypertension    Hypothyroidism, postradioiodine therapy    endocrinoloigst-  dr balan--  2004  s/p  RAI   Menorrhagia 2011   Multiple pulmonary nodules    secondary  metastatic   PONV (postoperative nausea and  vomiting)    patch helps    SUI (stress urinary incontinence, female)    Wears contact lenses    Wears dentures    full upper and lower partial    Past Surgical History:  Procedure Laterality Date   BREAST LUMPECTOMY WITH NEEDLE LOCALIZATION AND AXILLARY SENTINEL LYMPH NODE BX Right 11/28/2015   Procedure: RIGHT BREAST LUMPECTOMY WITH NEEDLE LOCALIZATION AND AXILLARY SENTINEL LYMPH NODE BX;  Surgeon: Autumn Messing III, MD;  Location: [facility];  Service: General;  Laterality: Right;   BREAST REDUCTION SURGERY Bilateral 11/28/2015   Procedure: MAMMARY REDUCTION  (BREAST) BILATERAL;  Surgeon: Crissie Reese, MD;  Location: [facility];  Service: Plastics;  Laterality: Bilateral;   CESAREAN SECTION  01/12/1993   COLONOSCOPY     Dental surgeries     FEMUR IM NAIL Left 01/28/2018   Procedure: INTRAMEDULLARY (IM) NAIL FEMORAL;  Surgeon: Nicholes Stairs, MD;  Location: [facility];  Service: Orthopedics;  Laterality: Left;   HYSTEROSCOPY N/A 09/02/2017   Procedure: HYSTEROSCOPY  to remove IUD;  Surgeon: Eldred Manges, MD;  Location: [facility];  Service: Gynecology;  Laterality: N/A;   IR IMAGING GUIDED PORT INSERTION  09/16/2020   Liver biospy  MASTECTOMY     MASTECTOMY W/ SENTINEL NODE BIOPSY Bilateral 12/26/2015   Procedure: BILATERAL MASTECTOMY WITH LEFT SENTINEL LYMPH NODE BIOPSY;  Surgeon: Autumn Messing III, MD;  Location: [facility];  Service: General;  Laterality: Bilateral;   Removal of IUD     TUBAL LIGATION Bilateral 1995    There were no vitals filed for this visit.   Subjective Assessment - 04/10/21 0858     Subjective I think I am ready for Discharge.  I am tired and my numbers have gone up. She will be kept on the same treatment plan. Still able to bend and cut nails better, can turn better, put on socks better.  I have a little catch in my hip. I have been doing my exercises at work and some at home.  I have been doing some of my band exercises to keep up with things too.     Pertinent History Stage IV breast Cancer s/p Bilateral Mastectomies with METS to Liver, spine, lung, Bone.  Has left  Femur IM rod.  Presently on Paclitaxel Day 1 and 15 of 28 day cycle.  Stable Right UE lymphedema. 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 unlimted, stiff when she rises    How long can you stand comfortably? 5-10 min then has to sit or lean    How long can you walk comfortably? several hundred yards.    Patient Stated Goals Decrease pain, improve mobility to perform activities better    Currently in Pain? Yes    Pain Score 4     Pain Location Back    Pain Orientation Left;Lower    Pain Descriptors / Indicators Sharp;Aching   catching sensation   Pain Type Chronic pain    Pain Onset More than a month ago    Pain Frequency Intermittent    Multiple Pain Sites No                [facility] PT Assessment - 04/10/21 0001       Assessment   Medical Diagnosis Bilateral Breast CA with METS    Referring Provider (PT) Dr. [name]    Hand Dominance Right      Precautions   Precaution Comments Breast CA with METS, lymphedema      Prior Function   Level of Independence Independent      Cognition   Overall Cognitive Status Within Functional Limits for tasks assessed      AROM   Lumbar Flexion to knees catch in left LB    Lumbar Extension Decreased 30% tight    Lumbar - Right Side Bend WNL tight    Lumbar - Left Side Bend WNL tight on right, pain on left    Lumbar - Right Rotation WNL tight    Lumbar - Left Rotation WNL tight                   Flowsheet Row Outpatient [facility]  Lymphedema Life Impact Scale Total Score 11.76 %             [facility] Adult PT Treatment/Exercise - 04/10/21 0001       Lumbar Exercises: Stretches   Other Lumbar Stretch Exercise pelvic rocking and lateral shifting seated on mat table x 10 ea, supine on wedge ab set with  alternate arm raises 2 x 5      Lumbar Exercises: Aerobic   Nustep  Lev 2, seat 9 UE 6 x 10 min      Lumbar Exercises: Supine   Pelvic Tilt 10 reps    Heel Slides 10 reps                          PT Long Term Goals - 04/10/21 0904       PT LONG TERM GOAL #1   Title Pt will be independent with HEP for lumbar, core and generalized strength    Time 6    Period Weeks    Status Achieved    Target Date 04/10/21      PT LONG TERM GOAL #2   Title Pt will have LBP decreased by atleast 50%    Time 6    Period Weeks    Status Achieved      PT LONG TERM GOAL #3   Title pt will be able to shower without sitting on shower chair    Baseline standing bothers back, and she has SOB    Time 6    Period Weeks    Status Not Met      PT LONG TERM GOAL #4   Title pt will be able to wash dishes for 10-20 minutes with minimal complaints of LBP    Time 6    Period Weeks    Status Achieved      PT LONG TERM GOAL #5   Title pt will be able to rise from chair without pain or stiffness    Time 6    Period Weeks    Status Partially Met                   Plan - 04/10/21 0948     Clinical Impression Statement Pt was reassessed today for Discharge.  She is more fatigued and her numbers have come up.  She wants to take a break from therapy at this time, and will consider resuming when she feels better. She has achieved 3 goals and partially achieved 1 goal for being able to perform sit to stand without pain or stiffness.  She is able to do this intermittently.  She is not yet able to shower without using her shower chair because of back pain and SOB. She is compliant with her HEP and LBP overall is 50% better.    Personal Factors and Comorbidities Comorbidity 3+    Comorbidities Bilateral mastectomies for Breast CA with radiation, Bone and liver METS, right UE lymphedema    Examination-Activity Limitations Bend;Hygiene/Grooming;Stand;Lift    Stability/Clinical Decision  Making Evolving/Moderate complexity    Rehab Potential Good    PT Frequency 1x / week    PT Duration 8 weeks    PT Treatment/Interventions Aquatic Therapy;ADLs/Self Care Home Management;Therapeutic exercise;Neuromuscular re-education;Manual techniques;Patient/family education;Therapeutic activities    PT Next Visit Plan Discharge to [place] core control and strengthening for improved posture    Consulted and Agree with Plan of Care Patient             Patient will benefit from skilled therapeutic intervention in order to improve the following deficits and impairments:  Difficulty walking, Pain, Decreased knowledge of precautions, Decreased activity tolerance, Postural dysfunction, Decreased strength, Decreased mobility  Visit Diagnosis: Chronic bilateral low back pain without sciatica  Abnormal posture  Muscle weakness (generalized)  Primary malignant neoplasm of breast with metastasis to other site, unspecified laterality ([place])  Malignant neoplasm metastatic  to lung, unspecified laterality [facility])     Problem List Patient Active Problem List   Diagnosis Date Noted   Encounter for antineoplastic chemotherapy 10/08/2020   Lower extremity edema 10/08/2020   Lung metastases ([place]) 09/10/2020   Goals of care, counseling/discussion 12/27/2018   Osteonecrosis ([place]) 11/01/2018   Pathological fracture in neoplastic disease, left femur, initial encounter for fracture ([place]) 01/28/2018   Morbid obesity with BMI of 40.0-44.9, adult ([place]) 08/71/9941   IUD complication ([place]) 29/08/7531   Bone metastases ([place]) 08/13/2017   Pain from bone metastases ([place]) 08/13/2017   Liver metastases ([place]) 08/13/2017   Steatosis of liver 08/13/2017   Bilateral malignant neoplasm of breast in female, estrogen receptor positive ([place]) 12/26/2015   Malignant neoplasm of central portion of left breast in female, estrogen receptor positive ([place]) 11/28/2015   Malignant neoplasm of  upper-inner quadrant of right breast in female, estrogen receptor positive ([place]) 10/17/2015   Hypertension 04/19/2012   Hypothyroid 04/19/2012   Fibroids 04/19/2012   Stress incontinence, female 04/19/2012    Claris Pong, PT 04/10/2021, 9:58 AM  [place] @ [place], [place], [address] Phone: [phone]   Fax:  [phone]  Name: Debbie [name] MRN: [id number] Date of Birth: 02-07-1966

## 2021-04-14 NOTE — Progress Notes (Signed)
[place]  [phone] [phone]    ID: Debbie [name] DOB: 26-Jun-1965  MR#: [id number]  BZJ#:[id number]  Patient Care Team: Kelton Pillar, MD as PCP - General (Family Medicine) Jacelyn Pi, MD as Consulting Physician (Endocrinology) Jovita Kussmaul, MD as Consulting Physician (General Surgery) Marrian Bells, Virgie Dad, MD as Consulting Physician (Oncology) [name], MD as Attending Physician (Radiation Oncology) Belva Crome, MD as Consulting Physician (Cardiology) [name], [name], NP as Nurse Practitioner (Hematology and Oncology) Lajoyce Lauber, DMD (Dentistry) Raina Mina, RPH-CPP (Pharmacist) Nicholas Lose, MD as Consulting Physician (Hematology and Oncology) OTHER MD:   CHIEF COMPLAINT: Estrogen receptor positive stage IV  breast cancer (s/p bilateral mastectomies)  CURRENT TREATMENT: paclitaxel; denosumab/Xgeva   INTERVAL HISTORY: Debbie [name] returns today for follow up and treatment of her stage IV breast cancer.  Since her last visit, she underwent repeat staging scans on 04/01/2021. Chest CT showed: scattered pulmonary nodules bilaterally, stable to slightly increased in size, no new nodules; mediastinal and right hilar lymphadenopathy and osseous metastatic disease not significantly changed from prior.  Abdomen MRI showed: stable dominant lesions in liver; increased conspicuity of smaller lesions without "measurable" difference.  She was started on paclitaxel on 09/10/2020, initially 3 weeks on 1 week off.. She now receives treatments days 1 and 15 of each 28-day cycle.  Debbie [name] continues to tolerate her treatment well.  She has no worsening in peripheral neuropathy.    She continues on Xgeva every 4 weeks, she tolerates this well.  Her most recent dose was 04/08/2021 and her next 1 will be 05/06/2021.  We are following her CA 27-29: Lab Results  Component Value Date   CA2729 242.6 (H) 04/08/2021   CA2729  199.6 (H) 03/25/2021   CA2729 165.8 (H) 03/11/2021   CA2729 176.4 (H) 02/25/2021   CA2729 135.4 (H) 02/11/2021    REVIEW OF SYSTEMS: Debbie [name] continues to work full-time.  She does feel tired.  She walks a little bit around work but by the time she gets home she does not otherwise exercise.  She did do the cooking for Thanksgiving's and had a good time with that.  She tells me the numbness in her feet and hand comes and goes.  It seems to be related to swelling so when she is sitting or standing for a long time her ankles swell and she gets a little bit of numbness feeling but then when she puts her legs up at night the peripheral neuropathy goes away.  The swelling in the right upper extremity she thinks may be up a bit.  Sometimes she has blurred vision, she has decreased appetite, she has a metallic taste in her mouth, but no unusual headaches, no nausea or vomiting, no balance issues, no falls, no cough or phlegm production.  A detailed review of systems was otherwise stable.   COVID 19 VACCINATION STATUS: Status post Pfizer x2, with booster January 2021   BREAST CANCER HISTORY: From the original intake note:  Debbie [name] had screening mammography with tomosynthesis at the [facility] 10/02/2015. This showed 2 possible masses in the right breast as well as some suspicious calcifications. The left breast was unremarkable. On 10/15/2015 she underwent right diagnostic mammography with tomography and right breast ultrasonography. The breast density was category B. Spot magnification views confirmed an area of pleomorphic calcifications measuring up to 5 cm in the upper half of the breast. Also in the upper outer right breast there were adjacent low-density masses without distortion  or calcifications. On physical exam there was no palpable abnormality.   Targeted ultrasound of the right breast found the 2 "masses" in the right upper quadrant were benign cysts measuring 0.8 and 0.5 cm respectively.  Biopsy of the area of calcification on 10/15/2015 showed ([id number]) invasive ductal carcinoma, E-cadherin positive, grade 2, estrogen receptor 90% positive, progesterone receptor 50% positive, both with strong staining intensity, with an MIB-1 of 70%, and no HER-2 amplification, the signals ratio being 1.18 and the number per cell 2.60.   Her subsequent history is as detailed below    PAST MEDICAL HISTORY: Past Medical History:  Diagnosis Date   Anxiety    Arthritis    Bone metastasis ([place])    Breast cancer of upper-inner quadrant of right female breast [facility]) oncologist-  dr [name]--- 04/ 2019 ER/PR+ Stage IV w/ metastatic disease to liver, total spine, lung, bone   dx 06/ 2017  right breast invasive ductal carcinoma, Stage IIB, ER/PR + (pT2 pN1), grade 1-- s/p  right breast lumpecotmy w/ sln bx's and bilateral breast reduction 11-28-2015/  left breast with reduction , dx invasive ductal ca, Grade 1 (pT1cpNX) ER/PR positive/  adjuvant radiation completed 03-27-2016 to right chest wall, 2018  reclassifiec Stage IIA   Cancer, metastatic to liver ([place])    Chronic back pain    Constipation    Depression    Family history of adverse reaction to anesthesia     mother has Copd was kept on vent    Headache    Heart murmur    History of hyperthyroidism    s/p  RAI   History of radiation therapy 02/11/16- 03/27/16   Right Chest Wall 50.4 Gy in 28 fractions, Right Chest Wall Boost 10 Gy in 5 fractions.    Hyperlipidemia    Hypertension    Hypothyroidism, postradioiodine therapy    endocrinoloigst-  dr balan--  2004  s/p  RAI   Menorrhagia 2011   Multiple pulmonary nodules    secondary  metastatic   PONV (postoperative nausea and vomiting)    patch helps    SUI (stress urinary incontinence, female)    Wears contact lenses    Wears dentures    full upper and lower partial    PAST SURGICAL HISTORY: Past Surgical History:  Procedure Laterality Date   BREAST LUMPECTOMY WITH NEEDLE  LOCALIZATION AND AXILLARY SENTINEL LYMPH NODE BX Right 11/28/2015   Procedure: RIGHT BREAST LUMPECTOMY WITH NEEDLE LOCALIZATION AND AXILLARY SENTINEL LYMPH NODE BX;  Surgeon: Autumn Messing III, MD;  Location: [place];  Service: General;  Laterality: Right;   BREAST REDUCTION SURGERY Bilateral 11/28/2015   Procedure: MAMMARY REDUCTION  (BREAST) BILATERAL;  Surgeon: Crissie Reese, MD;  Location: [place];  Service: Plastics;  Laterality: Bilateral;   CESAREAN SECTION  01/12/1993   COLONOSCOPY     Dental surgeries     FEMUR IM NAIL Left 01/28/2018   Procedure: INTRAMEDULLARY (IM) NAIL FEMORAL;  Surgeon: Nicholes Stairs, MD;  Location: [place];  Service: Orthopedics;  Laterality: Left;   HYSTEROSCOPY N/A 09/02/2017   Procedure: HYSTEROSCOPY  to remove IUD;  Surgeon: Eldred Manges, MD;  Location: [place];  Service: Gynecology;  Laterality: N/A;   IR IMAGING GUIDED PORT INSERTION  09/16/2020   Liver biospy     MASTECTOMY     MASTECTOMY W/ SENTINEL NODE BIOPSY Bilateral 12/26/2015   Procedure: BILATERAL MASTECTOMY WITH LEFT SENTINEL LYMPH NODE BIOPSY;  Surgeon: Autumn Messing III, MD;  Location: [facility];  Service: General;  Laterality: Bilateral;   Removal of IUD     TUBAL LIGATION Bilateral 1995    FAMILY HISTORY: Family History  Problem Relation Age of Onset   Diabetes Mother    Hypertension Mother    Hypertension Father    Diabetes Maternal Grandmother    Hypertension Sister    Cancer Paternal Grandmother        colon   Colon cancer Paternal Grandmother    Esophageal cancer Neg Hx    Stomach cancer Neg Hx    Rectal cancer Neg Hx   The patient's parents are still living, in their early 19s as of June 2017. The patient has one brother, 2 sisters. On the maternal side there is a history of uterine and prostate cancer. On the paternal side there is a history of colon cancer and possibly uterine cancer. There is no history of breast or ovarian cancer in the family.    GYNECOLOGIC  HISTORY:  Patient's last menstrual period was 12/19/2015.  Menarche age 78. She still having regular periods. She is GX P1, first pregnancy age 35. She used oral contraceptives for a few years remotely, with no complications. She is currently on a Mirena IUD and also is status post bilateral tubal ligation.   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 Her husband [name] is a Administrator.  They are both taking appropriate precautions at work.  Their son [name], s a radio DJ.  The patient is expecting her first grandchild May 2023.  She is not a Ambulance person.   ADVANCED DIRECTIVES: In the absence of any documents to the contrary her husband is her healthcare power of attorney   HEALTH MAINTENANCE: Social History   Tobacco Use   Smoking status: Never   Smokeless tobacco: Never  Vaping Use   Vaping Use: Never used  Substance Use Topics   Alcohol use: No   Drug use: No     Colonoscopy: January 2016   PAP: January 2016   Bone density: August 2017 showed a T score of +0.7 normal  Lipid panel:  Allergies  Allergen Reactions   Ace Inhibitors Cough   Atorvastatin Other (See Comments)    Joint pain   Simvastatin Other (See Comments)    Joint pain    Current Outpatient Medications  Medication Sig Dispense Refill   acetaminophen (TYLENOL) 500 MG tablet Take 1,000 mg by mouth every 6 (six) hours as needed.     Calcium-Magnesium 250-125 MG TABS Take 1 tablet by mouth daily. 120 each 4   cetirizine (ZYRTEC) 10 MG tablet Take 10 mg by mouth daily as needed for allergies.      chlorhexidine (PERIDEX) 0.12 % solution Use 5 mLs in the mouth or throat 2 (two) times daily as needed as directed. Do not swallow 473 mL 6   doxycycline (VIBRA-TABS) 100 MG tablet Take 1 tablet (100 mg total) by mouth daily. 30 tablet 2   furosemide (LASIX) 20 MG tablet Take 1 tablet (20 mg total) by mouth daily. 90 tablet 6   levothyroxine (SYNTHROID) 175 MCG  tablet Take 1 tablet (175 mcg total) by mouth in the morning on an empty stomach 30 tablet 6   lidocaine-prilocaine (EMLA) cream Apply to affected area once 30 g 3   losartan-hydrochlorothiazide (HYZAAR) 100-25 MG tablet Take 1 tablet by mouth once a day 90 tablet 2   Multiple Vitamin (MULTIVITAMIN) tablet Take 1  tablet by mouth daily.     omega-3 acid ethyl esters (LOVAZA) 1 g capsule Take 1 g by mouth 2 (two) times daily.      omeprazole (PRILOSEC) 40 MG capsule Take 1 capsule (40 mg total) by mouth daily. 30 capsule 3   oxyCODONE (OXY IR/ROXICODONE) 5 MG immediate release tablet Take 1 tablet (5 mg total) by mouth every 4 (four) hours as needed for severe pain. 60 tablet 0   potassium chloride SA (KLOR-CON) 20 MEQ tablet Take 2 tablets (40 mEq total) by mouth daily. 90 tablet 4   pregabalin (LYRICA) 100 MG capsule Take 1 capsule (100 mg total) by mouth 3 (three) times daily. 90 capsule 0   prochlorperazine (COMPAZINE) 10 MG tablet Take 1 tablet (10 mg total) by mouth every 6 (six) hours as needed (Nausea or vomiting). 30 tablet 1   rosuvastatin (CRESTOR) 5 MG tablet Take 1 tablet (5 mg total) by mouth daily. 30 tablet 1   sertraline (ZOLOFT) 100 MG tablet TAKE 1 TABLET BY MOUTH ONCE A DAY 30 tablet 12   valACYclovir (VALTREX) 1000 MG tablet Take 1 tablet (1,000 mg total) by mouth daily. 60 tablet 6   Vitamin D, Cholecalciferol, 1000 units CAPS Take 1 tablet by mouth daily. (Patient taking differently: Take 1,000 Units by mouth daily.) 90 capsule 4   No current facility-administered medications for this visit.   Facility-Administered Medications Ordered in Other Visits  Medication Dose Route Frequency Provider Last Rate Last Admin   sodium chloride flush (NS) 0.9 % injection 10 mL  10 mL Intravenous PRN Alann Avey, Virgie Dad, MD   10 mL at 04/15/21 0810    OBJECTIVE: African-American woman in no acute distress  Vitals:   04/15/21 0814  BP: (!) 170/87  Pulse: 80  Resp: 18  Temp: 97.7 F  (36.5 C)  SpO2: 95%      Body mass index is 47.26 kg/m.    ECOG FS:1 - Symptomatic but completely ambulatory  Filed Weights   04/15/21 0814  Weight: 292 lb 12.8 oz (132.8 kg)    Sclerae unicteric, EOMs intact Wearing a mask No cervical or supraclavicular adenopathy Lungs no rales or rhonchi Heart regular rate and rhythm Abd soft, obese, nontender, positive bowel sounds MSK no focal spinal tenderness, severe right upper extremity lymphedema as previously noted, compression sleeve in place Neuro: nonfocal, well oriented, appropriate affect Breasts: Deferred; she is status post bilateral mastectomies   LAB RESULTS:  CMP     Component Value Date/Time   NA 138 04/08/2021 0801   NA 140 07/08/2016 0948   K 3.4 (L) 04/08/2021 0801   K 4.1 07/08/2016 0948   CL 102 04/08/2021 0801   CO2 30 04/08/2021 0801   CO2 24 07/08/2016 0948   GLUCOSE 98 04/08/2021 0801   GLUCOSE 89 07/08/2016 0948   BUN 11 04/08/2021 0801   BUN 11.9 07/08/2016 0948   CREATININE 0.83 04/08/2021 0801   CREATININE 0.9 07/08/2016 0948   CALCIUM 8.9 04/08/2021 0801   CALCIUM 10.0 07/08/2016 0948   PROT 7.0 04/08/2021 0801   PROT 7.6 07/08/2016 0948   ALBUMIN 3.5 04/08/2021 0801   ALBUMIN 3.6 07/08/2016 0948   AST 22 04/08/2021 0801   AST 22 07/08/2016 0948   ALT 21 04/08/2021 0801   ALT 27 07/08/2016 0948   ALKPHOS 114 04/08/2021 0801   ALKPHOS 115 07/08/2016 0948   BILITOT 0.5 04/08/2021 0801   BILITOT 0.35 07/08/2016 0948   GFRNONAA >60 04/08/2021 0801  GFRAA >60 01/30/2020 0814   GFRAA >60 05/16/2019 0905    INo results found for: SPEP, UPEP  Lab Results  Component Value Date   WBC 6.3 04/15/2021   NEUTROABS 4.9 04/15/2021   HGB 10.2 (L) 04/15/2021   HCT 31.7 (L) 04/15/2021   MCV 88.8 04/15/2021   PLT 304 04/15/2021      Chemistry      Component Value Date/Time   NA 138 04/08/2021 0801   NA 140 07/08/2016 0948   K 3.4 (L) 04/08/2021 0801   K 4.1 07/08/2016 0948   CL 102  04/08/2021 0801   CO2 30 04/08/2021 0801   CO2 24 07/08/2016 0948   BUN 11 04/08/2021 0801   BUN 11.9 07/08/2016 0948   CREATININE 0.83 04/08/2021 0801   CREATININE 0.9 07/08/2016 0948      Component Value Date/Time   CALCIUM 8.9 04/08/2021 0801   CALCIUM 10.0 07/08/2016 0948   ALKPHOS 114 04/08/2021 0801   ALKPHOS 115 07/08/2016 0948   AST 22 04/08/2021 0801   AST 22 07/08/2016 0948   ALT 21 04/08/2021 0801   ALT 27 07/08/2016 0948   BILITOT 0.5 04/08/2021 0801   BILITOT 0.35 07/08/2016 0948       No results found for: LABCA2  No components found for: [id number]  No results for input(s): INR in the last 168 hours.  Urinalysis No results found for: COLORURINE, APPEARANCEUR, LABSPEC, PHURINE, GLUCOSEU, HGBUR, BILIRUBINUR, KETONESUR, PROTEINUR, UROBILINOGEN, NITRITE, LEUKOCYTESUR   STUDIES: CT Chest W Contrast  Result Date: 04/02/2021 CLINICAL DATA:  Breast cancer staging, assess treatment response. EXAM: CT CHEST WITH CONTRAST TECHNIQUE: Multidetector CT imaging of the chest was performed during intravenous contrast administration. CONTRAST:  24m OMNIPAQUE IOHEXOL 350 MG/ML SOLN COMPARISON:  02/07/2021. FINDINGS: Cardiovascular: The heart is normal in size and there is no pericardial effusion. The distal tip of a right chest port terminates in the superior vena cava. There is minimal atherosclerotic calcification of the distal descending aorta. The pulmonary trunk is normal in caliber. Mediastinum/Nodes: The thyroid gland is not visualized on exam. The trachea and esophagus are within normal limits. There is redemonstration of enlarged mediastinal and right hilar lymph nodes. There is a subcarinal lymph node measuring 1.9 cm, unchanged from the prior exam. There is a right hilar lymph node measuring 1.1 cm, also unchanged. No axillary or definite left hilar lymphadenopathy. Lungs/Pleura: Ground-glass opacities and paraseptal thickening are noted in the right upper lobe anteriorly  in the anterior aspect of the right middle lobe, unchanged from the previous exam and likely representing postradiation changes. There is scattered pulmonary nodules bilaterally. The largest on the left in the fissure measures 5 mm, axial image 90, slightly increased in size. The largest nodule on the right measures 4 mm, axial image 94 and unchanged from the prior exam. No new nodule is identified. No effusion or pneumothorax. Upper Abdomen: No acute abnormality. Musculoskeletal: Status post bilateral mastectomy with right lymph node dissection. Mixed lytic and sclerotic lesions are noted within the bones. No pathologic fracture is identified. IMPRESSION: 1. Scattered pulmonary nodules bilaterally, stable to slightly increased in size. No new nodule is identified. Metastatic disease can not be excluded and continued surveillance is recommended. 2. Mediastinal and right hilar lymphadenopathy, not significantly changed from the prior exam, possible metastatic disease. 3. Osseous metastatic disease, not significantly changed from the prior exam. No pathologic fracture is identified. Electronically Signed   By: LBrett FairyM.D.   On: 04/02/2021 01:08  MR Abdomen W Wo Contrast  Result Date: 04/03/2021 CLINICAL DATA:  Assess treatment response in a patient with history of breast cancer and known hepatic metastases. EXAM: MRI ABDOMEN WITHOUT AND WITH CONTRAST TECHNIQUE: Multiplanar multisequence MR imaging of the abdomen was performed both before and after the administration of intravenous contrast. CONTRAST:  26m GADAVIST GADOBUTROL 1 MMOL/ML IV SOLN COMPARISON:  Comparison is made with examination of December 07, 2018. FINDINGS: Lower chest: Incidental imaging of the lung bases with limited assessment on MRI is unremarkable. Hepatobiliary: Hepatic steatosis is less pronounced than on previous imaging. There are innumerable foci of subtle and pronounced diffusion related abnormalities in the RIGHT and LEFT hepatic  lobe. Quality of diffusion-weighted imaging is improved over the previous exam. Given multiplicity new lesions would be difficult to detect. Distribution is diffuse as on the previous study. Subjectively lesions appear more conspicuous than on prior imaging across multiple sequences. LEFT hepatic lobe lesion (image 21/4) 16 mm is stable with respect to size. Caudate lobe lesion (image 19/4) 14 mm also stable. 16-17 mm lesion in hepatic subsegment VI (image 32/9) stable at approximately 17 mm w. Contrasted imaging also displays these lesions but terms of comparison imaging visibility is improved on today's study. (Image 30/23) 10 mm lesion previously 8-9 mm.  LEFT hepatic lobe. (Image 38/23) 12 mm lesion previously 12 mm. RIGHT hepatic lobe at the periphery in hepatic subsegment V/VIII. wMost pronounced difference on the current study is suggested on diffusion or perhaps the smaller lesions appear more conspicuous accounting for the potential open "change". No pericholecystic stranding or biliary duct dilation. Pancreas: Normal intrinsic T1 signal. No ductal dilation or sign of inflammation. No focal lesion. Spleen:  Unremarkable. Adrenals/Urinary Tract: Adrenal glands are normal. Symmetric renal enhancement without hydronephrosis or perinephric stranding. No suspicious renal lesion. Stomach/Bowel: Gastrointestinal tract is unremarkable to the extent evaluated on abdominal MRI. Vascular/Lymphatic: No pathologically enlarged lymph nodes identified. No abdominal aortic aneurysm demonstrated. Other:  No ascites. Musculoskeletal: Enhancing metastatic lesions without substantial change. IMPRESSION: Dominant lesions in the liver are stable. Increased conspicuity of smaller lesions on the current study without "measurable" difference. Dominant lesions are certainly smaller than April of 2022. Consider short interval follow-up given the increased conspicuity of smaller lesions which subjectively raise the question of  increasing number of lesions since previous imaging. Consider Eovist subsequent on follow-up given the ability to measure discrete lesions more accurately. Signs of bony metastatic disease. Hepatic steatosis is less pronounced than on previous imaging. Electronically Signed   By: GZetta BillsM.D.   On: 04/03/2021 10:23     ELIGIBLE FOR AVAILABLE RESEARCH PROTOCOL: PALLAS, but inelegible because of Mirena IUD   ASSESSMENT: 55y.o. [place] woman status post right breast upper inner quadrant biopsy 10/15/2015 for a clinical T2-T3 N0, stage II invasive ductal carcinoma, estrogen and progesterone receptor positive, HER-2 nonamplified, with an Mib-1 of 70%  (1) status post right lumpectomy and sentinel lymph node sampling with oncoplastic breast reduction 11/28/2015 for a pT2 pN1, stage IIB invasive ductal carcinoma, grade 1, with negative margins (1/1 sentinel node positive)  (a) reclassified as stage IIA in the 2018 prognostic revision   (2) left reduction mammoplasty 11/28/2015 unexpectedly found a pT1c pNX invasive ductal carcinoma, grade 1, estrogen and progesterone receptor positive, HER-2 not amplified, with an MIB-1 of 3%  (3) Mammaprint from the right sided breast cancer was "low risk", predicting a 98% chance of no disease recurrence within 5 years with anti-estrogens as the only systemic therapy.  It also predicts minimal to no benefit from chemotherapy  (4) status post bilateral mastectomies with bilateral sentinel lymph node sampling 12/26/2015, showing  (a) on the right, no residual carcinoma (0/3 nodes involved)  (b) on the left, focal ductal carcinoma in situ, 0.2 cm, with negative margins; (0/4 nodes involved)  (5) adjuvant radiation 02/11/16 - 03/27/16    1) Right chest wall: 50.4 Gy in 28 fractions               2) Right chest wall boost: 10 Gy in 5 fractions   (6) started on tamoxifen neoadjuvantly 10/23/2015 to allow for expected delays in definitive local treatment  (a)  Mirena IUD in place  (b) tamoxifen discontinued 08/13/2017, with progression  (7) since she had 4 lymph nodes removed from each axilla but also receive radiation on the right, lab draws should preferentially be obtained from the left arm   METASTATIC DISEASE: April 2019 (8) CT scan of the abdomen and pelvis 08/04/2017, MRI of the liver and total spine MRI 08/11/2017 showed liver and bone metastases  (a) liver biopsy 09/07/2017 confirms estrogen receptor positive, progesterone receptor negative, HER-2 negative metastatic breast cancer  (b) chest CT scan 08/13/2017 shows multiple bilateral pulmonary nodules.  (c) CT of the brain with and without contrast 08/26/2017 shows no evidence of metastatic disease to the brain  (d) CA 27-29 is informative  (9) fulvestrant started 08/17/2017  (a) palbociclib started 08/31/2017  (b) fulvestrant and palbociclib discontinued 01/04/2018 with evidence of progression in the liver  (10) denosumab/Xgeva started 09/14/2017  (a) held after 03/01/2018 dose because concern re. osteonecrosis  (b) resumed 06/09/2018  (c) 02/21/2019 dose held due to oral surgery, resumed 03/21/2019  (11) anastrozole started 01/04/2018  (a) everolimus started 01/29/2018  (b) liver MRI and bone scan December 2019 show stable disease  (c) chest CT scan on 08/02/2018 shows lung lesions resolved, bone lesions stable  (d) bone scan 09/13/2018 essentially stable  (e) liver MRI 12/08/2018 shows improvement in hepatic metastsases  (f) liver MRI and bone scan 04/13/2019 showed continuing improvement in the liver, stable bone lesions  (g) chest CT 08/01/2019 stable, no evidence of active disease ows  (12) 01/28/18 prophylactic left femur intramedullary nail surgery for impending left femur pathologic fracture  (a) palliative radiation with Dr. [name] to the left femur and left ilium from 02/16/2018 to 03/01/2018  (13) [facility] report on liver biopsy from April 2019 shows a negative PDL 1,  stable MSI and proficient mismatch repair status.  The tumor is positive for the androgen receptor, and for PTEN for PIK3 CA mutations.  HER-2/neu was read as equivocal (it was negative by FISH on the pathology report).  (14) Progression noted in liver MRI on 01/11/2020; fulvestrant started on 01/30/2020  (a) alpelisib started 01/30/2020 at 300 mg daily with Metformin 500 mg daily  (b) alpelisib held on 02/07/2020 with very high blood sugars.  Metformin increased to 1 g daily  (c) alpelisib resumed at 150 mg daily on 02/13/2020  (d) liver MRI 05/06/2020 shows evidence of disease progression  (e) fulvestrant and alpelisib discontinued January 2022  (15) capecitabine started 05/22/2020 at 1.5 g twice daily, 14/7  (a) abdominal MRI 08/23/2020 shows worsening liver disease  (b) chest CT scan 09/04/2020 shows new lung metastases  (c) capecitabine discontinued April 2022  (16) paclitaxel started 09/10/2020, repeated days 1, 8, and 15 of each 28-day cycle  (A) changed to every 2 weeks beginning with cycle 3 (11/05/2020)  (B) MRI  of the abdomen 02/09/2021 shows evidence of response  (C) chest CT 02/10/2021 shows stable disease   PLAN: Emilly is now 3-1/2 years out from definitive diagnosis of metastatic breast cancer.  We just restaged her and I would despite the rising tumor markers I am interpreting her scans especially the liver MRI as stable.  She does have some neuropathy symptoms but these are grade 1, they appear to be intermittent, and they are confused by lymphedema issues.  The borderline though is I do not think we need to discontinue Taxol because of toxicity.  Accordingly she will be treated 1213 and then see [place] again 1227.  At that date assuming all is well she will receive Taxol again and then she will see Dr. Sonny Dandy for a full on new patient evaluation 05/20/2021.  At that time he and she can decide whether to continue Paxil or switch to a different agent.  She knows to call for any  other issue that may develop before then  Total encounter time 25 minutes.Sarajane Jews C. Dakoda Laventure, MD 04/15/21 8:26 AM Medical Oncology and Hematology [facility] [place], [place] [address] Tel. [phone]    Fax. [phone]   I, Wilburn Mylar, am acting as scribe for Dr. Virgie Dad. Brieana Shimmin.  I, Lurline Del MD, have reviewed the above documentation for accuracy and completeness, and I agree with the above.    *Total Encounter Time as defined by the Centers for Medicare and Medicaid Services includes, in addition to the face-to-face time of a patient visit (documented in the note above) non-face-to-face time: obtaining and reviewing outside history, ordering and reviewing medications, tests or procedures, care coordination (communications with other health care professionals or caregivers) and documentation in the medical record.

## 2021-04-15 ENCOUNTER — Other Ambulatory Visit: Payer: Self-pay | Admitting: *Deleted

## 2021-04-15 ENCOUNTER — Other Ambulatory Visit: Payer: Self-pay

## 2021-04-15 ENCOUNTER — Inpatient Hospital Stay: Payer: 59

## 2021-04-15 ENCOUNTER — Inpatient Hospital Stay: Payer: 59 | Attending: Oncology | Admitting: Oncology

## 2021-04-15 VITALS — BP 170/87 | HR 80 | Temp 97.7°F | Resp 18 | Ht 66.0 in | Wt 292.8 lb

## 2021-04-15 DIAGNOSIS — Z5111 Encounter for antineoplastic chemotherapy: Secondary | ICD-10-CM | POA: Insufficient documentation

## 2021-04-15 DIAGNOSIS — Z170 Estrogen receptor positive status [ER+]: Secondary | ICD-10-CM

## 2021-04-15 DIAGNOSIS — C7951 Secondary malignant neoplasm of bone: Secondary | ICD-10-CM | POA: Insufficient documentation

## 2021-04-15 DIAGNOSIS — C50112 Malignant neoplasm of central portion of left female breast: Secondary | ICD-10-CM | POA: Diagnosis not present

## 2021-04-15 DIAGNOSIS — C50211 Malignant neoplasm of upper-inner quadrant of right female breast: Secondary | ICD-10-CM

## 2021-04-15 DIAGNOSIS — G629 Polyneuropathy, unspecified: Secondary | ICD-10-CM | POA: Insufficient documentation

## 2021-04-15 DIAGNOSIS — Z95828 Presence of other vascular implants and grafts: Secondary | ICD-10-CM

## 2021-04-15 DIAGNOSIS — Z79899 Other long term (current) drug therapy: Secondary | ICD-10-CM | POA: Diagnosis not present

## 2021-04-15 DIAGNOSIS — C787 Secondary malignant neoplasm of liver and intrahepatic bile duct: Secondary | ICD-10-CM | POA: Diagnosis not present

## 2021-04-15 DIAGNOSIS — Z923 Personal history of irradiation: Secondary | ICD-10-CM | POA: Diagnosis not present

## 2021-04-15 DIAGNOSIS — Z9013 Acquired absence of bilateral breasts and nipples: Secondary | ICD-10-CM | POA: Insufficient documentation

## 2021-04-15 LAB — CBC WITH DIFFERENTIAL/PLATELET
Abs Immature Granulocytes: 0.06 10*3/uL (ref 0.00–0.07)
Basophils Absolute: 0.1 10*3/uL (ref 0.0–0.1)
Basophils Relative: 1 %
Eosinophils Absolute: 0.2 10*3/uL (ref 0.0–0.5)
Eosinophils Relative: 3 %
HCT: 31.7 % — ABNORMAL LOW (ref 36.0–46.0)
Hemoglobin: 10.2 g/dL — ABNORMAL LOW (ref 12.0–15.0)
Immature Granulocytes: 1 %
Lymphocytes Relative: 13 %
Lymphs Abs: 0.8 10*3/uL (ref 0.7–4.0)
MCH: 28.6 pg (ref 26.0–34.0)
MCHC: 32.2 g/dL (ref 30.0–36.0)
MCV: 88.8 fL (ref 80.0–100.0)
Monocytes Absolute: 0.4 10*3/uL (ref 0.1–1.0)
Monocytes Relative: 6 %
Neutro Abs: 4.9 10*3/uL (ref 1.7–7.7)
Neutrophils Relative %: 76 %
Platelets: 304 10*3/uL (ref 150–400)
RBC: 3.57 MIL/uL — ABNORMAL LOW (ref 3.87–5.11)
RDW: 18.6 % — ABNORMAL HIGH (ref 11.5–15.5)
WBC: 6.3 10*3/uL (ref 4.0–10.5)
nRBC: 0.3 % — ABNORMAL HIGH (ref 0.0–0.2)

## 2021-04-15 LAB — COMPREHENSIVE METABOLIC PANEL
ALT: 20 U/L (ref 0–44)
AST: 19 U/L (ref 15–41)
Albumin: 3.2 g/dL — ABNORMAL LOW (ref 3.5–5.0)
Alkaline Phosphatase: 121 U/L (ref 38–126)
Anion gap: 11 (ref 5–15)
BUN: 12 mg/dL (ref 6–20)
CO2: 27 mmol/L (ref 22–32)
Calcium: 9.2 mg/dL (ref 8.9–10.3)
Chloride: 102 mmol/L (ref 98–111)
Creatinine, Ser: 0.79 mg/dL (ref 0.44–1.00)
GFR, Estimated: >60 mL/min (ref >60)
Glucose, Bld: 101 mg/dL — ABNORMAL HIGH (ref 70–99)
Potassium: 3.4 mmol/L — ABNORMAL LOW (ref 3.5–5.1)
Sodium: 140 mmol/L (ref 135–145)
Total Bilirubin: 0.4 mg/dL (ref 0.3–1.2)
Total Protein: 6.7 g/dL (ref 6.5–8.1)

## 2021-04-15 MED ORDER — SODIUM CHLORIDE 0.9% FLUSH
10.0000 mL | INTRAVENOUS | Status: DC | PRN
  Administered 2021-04-15: 10 mL via INTRAVENOUS

## 2021-04-16 LAB — CANCER ANTIGEN 27.29: CA 27.29: 218.9 U/mL — ABNORMAL HIGH (ref 0.0–38.6)

## 2021-04-17 ENCOUNTER — Ambulatory Visit (HOSPITAL_BASED_OUTPATIENT_CLINIC_OR_DEPARTMENT_OTHER): Payer: Self-pay | Admitting: Physical Therapy

## 2021-04-24 ENCOUNTER — Inpatient Hospital Stay: Payer: 59

## 2021-04-24 ENCOUNTER — Encounter: Payer: 59 | Admitting: Rehabilitation

## 2021-04-24 ENCOUNTER — Other Ambulatory Visit: Payer: Self-pay

## 2021-04-24 VITALS — BP 143/72 | HR 84 | Temp 98.4°F | Resp 16 | Wt 292.0 lb

## 2021-04-24 DIAGNOSIS — Z79899 Other long term (current) drug therapy: Secondary | ICD-10-CM | POA: Diagnosis not present

## 2021-04-24 DIAGNOSIS — Z923 Personal history of irradiation: Secondary | ICD-10-CM | POA: Diagnosis not present

## 2021-04-24 DIAGNOSIS — Z170 Estrogen receptor positive status [ER+]: Secondary | ICD-10-CM

## 2021-04-24 DIAGNOSIS — C7951 Secondary malignant neoplasm of bone: Secondary | ICD-10-CM | POA: Diagnosis not present

## 2021-04-24 DIAGNOSIS — C787 Secondary malignant neoplasm of liver and intrahepatic bile duct: Secondary | ICD-10-CM | POA: Diagnosis not present

## 2021-04-24 DIAGNOSIS — Z9013 Acquired absence of bilateral breasts and nipples: Secondary | ICD-10-CM | POA: Diagnosis not present

## 2021-04-24 DIAGNOSIS — G629 Polyneuropathy, unspecified: Secondary | ICD-10-CM | POA: Diagnosis not present

## 2021-04-24 DIAGNOSIS — Z95828 Presence of other vascular implants and grafts: Secondary | ICD-10-CM

## 2021-04-24 DIAGNOSIS — Z5111 Encounter for antineoplastic chemotherapy: Secondary | ICD-10-CM | POA: Diagnosis not present

## 2021-04-24 DIAGNOSIS — C50211 Malignant neoplasm of upper-inner quadrant of right female breast: Secondary | ICD-10-CM | POA: Diagnosis not present

## 2021-04-24 LAB — COMPREHENSIVE METABOLIC PANEL
ALT: 20 U/L (ref 0–44)
AST: 20 U/L (ref 15–41)
Albumin: 3.2 g/dL — ABNORMAL LOW (ref 3.5–5.0)
Alkaline Phosphatase: 122 U/L (ref 38–126)
Anion gap: 10 (ref 5–15)
BUN: 13 mg/dL (ref 6–20)
CO2: 29 mmol/L (ref 22–32)
Calcium: 9 mg/dL (ref 8.9–10.3)
Chloride: 102 mmol/L (ref 98–111)
Creatinine, Ser: 0.83 mg/dL (ref 0.44–1.00)
GFR, Estimated: >60 mL/min (ref >60)
Glucose, Bld: 103 mg/dL — ABNORMAL HIGH (ref 70–99)
Potassium: 3 mmol/L — ABNORMAL LOW (ref 3.5–5.1)
Sodium: 141 mmol/L (ref 135–145)
Total Bilirubin: 0.4 mg/dL (ref 0.3–1.2)
Total Protein: 6.8 g/dL (ref 6.5–8.1)

## 2021-04-24 LAB — CBC WITH DIFFERENTIAL/PLATELET
Abs Immature Granulocytes: 0.04 10*3/uL (ref 0.00–0.07)
Basophils Absolute: 0 10*3/uL (ref 0.0–0.1)
Basophils Relative: 1 %
Eosinophils Absolute: 0.2 10*3/uL (ref 0.0–0.5)
Eosinophils Relative: 3 %
HCT: 31.9 % — ABNORMAL LOW (ref 36.0–46.0)
Hemoglobin: 10.3 g/dL — ABNORMAL LOW (ref 12.0–15.0)
Immature Granulocytes: 1 %
Lymphocytes Relative: 12 %
Lymphs Abs: 0.6 10*3/uL — ABNORMAL LOW (ref 0.7–4.0)
MCH: 28.6 pg (ref 26.0–34.0)
MCHC: 32.3 g/dL (ref 30.0–36.0)
MCV: 88.6 fL (ref 80.0–100.0)
Monocytes Absolute: 0.5 10*3/uL (ref 0.1–1.0)
Monocytes Relative: 10 %
Neutro Abs: 3.8 10*3/uL (ref 1.7–7.7)
Neutrophils Relative %: 73 %
Platelets: 259 10*3/uL (ref 150–400)
RBC: 3.6 MIL/uL — ABNORMAL LOW (ref 3.87–5.11)
RDW: 18.7 % — ABNORMAL HIGH (ref 11.5–15.5)
WBC: 5.2 10*3/uL (ref 4.0–10.5)
nRBC: 0 % (ref 0.0–0.2)

## 2021-04-24 MED ORDER — FAMOTIDINE 20 MG IN NS 100 ML IVPB
20.0000 mg | Freq: Once | INTRAVENOUS | Status: AC
  Administered 2021-04-24: 20 mg via INTRAVENOUS
  Filled 2021-04-24: qty 100

## 2021-04-24 MED ORDER — HEPARIN SOD (PORK) LOCK FLUSH 100 UNIT/ML IV SOLN
500.0000 [IU] | Freq: Once | INTRAVENOUS | Status: AC | PRN
  Administered 2021-04-24: 500 [IU]

## 2021-04-24 MED ORDER — DEXAMETHASONE SODIUM PHOSPHATE 100 MG/10ML IJ SOLN
10.0000 mg | Freq: Once | INTRAMUSCULAR | Status: AC
  Administered 2021-04-24: 10 mg via INTRAVENOUS
  Filled 2021-04-24: qty 10

## 2021-04-24 MED ORDER — DIPHENHYDRAMINE HCL 50 MG/ML IJ SOLN
25.0000 mg | Freq: Once | INTRAMUSCULAR | Status: AC
  Administered 2021-04-24: 25 mg via INTRAVENOUS
  Filled 2021-04-24: qty 1

## 2021-04-24 MED ORDER — SODIUM CHLORIDE 0.9% FLUSH
10.0000 mL | INTRAVENOUS | Status: DC | PRN
  Administered 2021-04-24: 10 mL

## 2021-04-24 MED ORDER — SODIUM CHLORIDE 0.9% FLUSH
10.0000 mL | INTRAVENOUS | Status: AC | PRN
  Administered 2021-04-24: 10 mL

## 2021-04-24 MED ORDER — SODIUM CHLORIDE 0.9 % IV SOLN
80.0000 mg/m2 | Freq: Once | INTRAVENOUS | Status: AC
  Administered 2021-04-24: 192 mg via INTRAVENOUS
  Filled 2021-04-24: qty 32

## 2021-04-24 MED ORDER — SODIUM CHLORIDE 0.9 % IV SOLN: Freq: Once | INTRAVENOUS | Status: AC

## 2021-04-24 NOTE — Progress Notes (Signed)
Patient declined cryotherapy today. Education provided regarding benefits of cryotherapy.

## 2021-04-24 NOTE — Patient Instructions (Signed)
[facility] MEDICAL ONCOLOGY   Discharge Instructions: Thank you for choosing [facility] to provide your oncology and hematology care.   If you have a lab appointment with the Cancer Center, please go directly to the Cancer Center and check in at the registration area.   Wear comfortable clothing and clothing appropriate for easy access to any Portacath or PICC line.   We strive to give you quality time with your provider. You may need to reschedule your appointment if you arrive late (15 or more minutes).  Arriving late affects you and other patients whose appointments are after yours.  Also, if you miss three or more appointments without notifying the office, you may be dismissed from the clinic at the provider's discretion.      For prescription refill requests, have your pharmacy contact our office and allow 72 hours for refills to be completed.    Today you received the following chemotherapy and/or immunotherapy agents: paclitaxel.      To help prevent nausea and vomiting after your treatment, we encourage you to take your nausea medication as directed.  BELOW ARE SYMPTOMS THAT SHOULD BE REPORTED IMMEDIATELY: *FEVER GREATER THAN 100.4 F (38 C) OR HIGHER *CHILLS OR SWEATING *NAUSEA AND VOMITING THAT IS NOT CONTROLLED WITH YOUR NAUSEA MEDICATION *UNUSUAL SHORTNESS OF BREATH *UNUSUAL BRUISING OR BLEEDING *URINARY PROBLEMS (pain or burning when urinating, or frequent urination) *BOWEL PROBLEMS (unusual diarrhea, constipation, pain near the anus) TENDERNESS IN MOUTH AND THROAT WITH OR WITHOUT PRESENCE OF ULCERS (sore throat, sores in mouth, or a toothache) UNUSUAL RASH, SWELLING OR PAIN  UNUSUAL VAGINAL DISCHARGE OR ITCHING   Items with * indicate a potential emergency and should be followed up as soon as possible or go to the Emergency Department if any problems should occur.  Please show the CHEMOTHERAPY ALERT CARD or IMMUNOTHERAPY ALERT CARD at check-in  to the Emergency Department and triage nurse.  Should you have questions after your visit or need to cancel or reschedule your appointment, please contact [facility]ICAL [facility]COL[facility]  Dept: [phone]  and follow the prompts.  Office hours are 8:00 a.m. to 4:30 p.m. Monday - Friday. Please note that voicemails left after 4:00 p.m. may not be returned until the following business day.  We are closed weekends and major holidays. You have access to a nurse at all times for urgent questions. Please call the main number to the clinic Dept: [phone] and follow the prompts.   For any non-urgent questions, you may also contact your provider using MyChart. We now offer e-Visits for anyone 18 and older to request care online for non-urgent symptoms. For details visit [email or web address].   Also download the MyChart app! Go to the app store, search "MyChart", open the app, select , and log in with your MyChart username and password.  Due to Covid, a mask is required upon entering the hospital/clinic. If you do not have a mask, one will be given to you upon arrival. For doctor visits, patients may have 1 support person aged 18 or older with them. For treatment visits, patients cannot have anyone with them due to current Covid guidelines and our immunocompromised population.   

## 2021-04-25 LAB — CANCER ANTIGEN 27.29: CA 27.29: 243.9 U/mL — ABNORMAL HIGH (ref 0.0–38.6)

## 2021-04-28 ENCOUNTER — Other Ambulatory Visit (HOSPITAL_COMMUNITY): Payer: Self-pay

## 2021-04-28 ENCOUNTER — Other Ambulatory Visit: Payer: Self-pay | Admitting: Oncology

## 2021-04-29 ENCOUNTER — Other Ambulatory Visit (HOSPITAL_COMMUNITY): Payer: Self-pay

## 2021-04-29 ENCOUNTER — Other Ambulatory Visit: Payer: Self-pay | Admitting: Oncology

## 2021-04-29 MED ORDER — PREGABALIN 100 MG PO CAPS
100.0000 mg | ORAL_CAPSULE | Freq: Three times a day (TID) | ORAL | 1 refills | Status: DC
  Filled 2021-04-30: qty 90, 30d supply, fill #0
  Filled 2021-06-16: qty 90, 30d supply, fill #1

## 2021-04-30 ENCOUNTER — Other Ambulatory Visit: Payer: Self-pay | Admitting: Oncology

## 2021-04-30 ENCOUNTER — Other Ambulatory Visit (HOSPITAL_COMMUNITY): Payer: Self-pay

## 2021-04-30 ENCOUNTER — Encounter: Payer: Self-pay | Admitting: Adult Health

## 2021-04-30 DIAGNOSIS — H524 Presbyopia: Secondary | ICD-10-CM | POA: Diagnosis not present

## 2021-05-01 ENCOUNTER — Other Ambulatory Visit (HOSPITAL_COMMUNITY): Payer: Self-pay

## 2021-05-05 NOTE — Progress Notes (Signed)
[facility] Follow up:    Debbie Pillar, MD [address] [place] [address]   DIAGNOSIS:  Cancer Staging  Malignant neoplasm of central portion of left breast in female, estrogen receptor positive ([place]) Staging form: Breast, AJCC 7th Edition - Pathologic: Stage IA (T1c, N0, cM0) - Unsigned - Pathologic: Stage IV (M1) - Unsigned  Malignant neoplasm of upper-inner quadrant of right breast in female, estrogen receptor positive ([place]) Staging form: Breast, AJCC 7th Edition - Clinical stage from 10/23/2015: Stage IIA (T2, N0, M0) - Unsigned Staged by: Pathologist and managing physician Laterality: Right Estrogen receptor status: Positive Progesterone receptor status: Positive HER2 status: Negative Stage used in treatment planning: Yes National guidelines used in treatment planning: Yes Type of national guideline used in treatment planning: NCCN - Pathologic: Stage IIB (T2, N1, cM0) - Unsigned - Pathologic: Stage IV (M1) - Unsigned   SUMMARY OF ONCOLOGIC HISTORY: 55 y.o. Debbie [name] status post right breast upper inner quadrant biopsy 10/15/2015 for a clinical T2-T3 N0, stage II invasive ductal carcinoma, estrogen and progesterone receptor positive, HER-2 nonamplified, with an Mib-1 of 70%   (1) status post right lumpectomy and sentinel lymph node sampling with oncoplastic breast reduction 11/28/2015 for a pT2 pN1, stage IIB invasive ductal carcinoma, grade 1, with negative margins (1/1 sentinel node positive)             (a) reclassified as stage IIA in the 2018 prognostic revision    (2) left reduction mammoplasty 11/28/2015 unexpectedly found a pT1c pNX invasive ductal carcinoma, grade 1, estrogen and progesterone receptor positive, HER-2 not amplified, with an MIB-1 of 3%   (3) Mammaprint from the right sided breast cancer was "low risk", predicting a 98% chance of no disease recurrence within 5 years with anti-estrogens as the only  systemic therapy. It also predicts minimal to no benefit from chemotherapy   (4) status post bilateral mastectomies with bilateral sentinel lymph node sampling 12/26/2015, showing             (a) on the right, no residual carcinoma (0/3 nodes involved)             (b) on the left, focal ductal carcinoma in situ, 0.2 cm, with negative margins; (0/4 nodes involved)   (5) adjuvant radiation 02/11/16 - 03/27/16               1) Right chest wall: 50.4 Gy in 28 fractions               2) Right chest wall boost: 10 Gy in 5 fractions    (6) started on tamoxifen neoadjuvantly 10/23/2015 to allow for expected delays in definitive local treatment             (a) Mirena IUD in place             (b) tamoxifen discontinued 08/13/2017, with progression   (7) since she had 4 lymph nodes removed from each axilla but also receive radiation on the right, lab draws should preferentially be obtained from the left arm    METASTATIC DISEASE: April 2019 (8) CT scan of the abdomen and pelvis 08/04/2017, MRI of the liver and total spine MRI 08/11/2017 showed liver and bone metastases             (a) liver biopsy 09/07/2017 confirms estrogen receptor positive, progesterone receptor negative, HER-2 negative metastatic breast cancer             (b)  chest CT scan 08/13/2017 shows multiple bilateral pulmonary nodules.             (c) CT of the brain with and without contrast 08/26/2017 shows no evidence of metastatic disease to the brain             (d) CA 27-29 is informative   (9) fulvestrant started 08/17/2017             (a) palbociclib started 08/31/2017             (b) fulvestrant and palbociclib discontinued 01/04/2018 with evidence of progression in the liver   (10) denosumab/Xgeva started 09/14/2017             (a) held after 03/01/2018 dose because concern re. osteonecrosis             (b) resumed 06/09/2018             (c) 02/21/2019 dose held due to oral surgery, resumed 03/21/2019   (11) anastrozole  started 01/04/2018             (a) everolimus started 01/29/2018             (b) liver MRI and bone scan December 2019 show stable disease             (c) chest CT scan on 08/02/2018 shows lung lesions resolved, bone lesions stable             (d) bone scan 09/13/2018 essentially stable             (e) liver MRI 12/08/2018 shows improvement in hepatic metastsases             (f) liver MRI and bone scan 04/13/2019 showed continuing improvement in the liver, stable bone lesions             (g) chest CT 08/01/2019 stable, no evidence of active disease ows   (12) 01/28/18 prophylactic left femur intramedullary nail surgery for impending left femur pathologic fracture             (a) palliative radiation with Dr. [name] to the left femur and left ilium from 02/16/2018 to 03/01/2018   (13) [facility] report on liver biopsy from April 2019 shows a negative PDL 1, stable MSI and proficient mismatch repair status.  The tumor is positive for the androgen receptor, and for PTEN for PIK3 CA mutations.  HER-2/neu was read as equivocal (it was negative by FISH on the pathology report).   (14) Progression noted in liver MRI on 01/11/2020; fulvestrant started on 01/30/2020             (a) alpelisib started 01/30/2020 at 300 mg daily with Metformin 500 mg daily             (b) alpelisib held on 02/07/2020 with very high blood sugars.  Metformin increased to 1 g daily             (c) alpelisib resumed at 150 mg daily on 02/13/2020             (d) liver MRI 05/06/2020 shows evidence of disease progression             (e) fulvestrant and alpelisib discontinued January 2022   (15) capecitabine started 05/22/2020 at 1.5 g twice daily, 14/7             (a) abdominal MRI 08/23/2020 shows worsening liver disease             (  b) chest CT scan 09/04/2020 shows new lung metastases             (c) capecitabine discontinued April 2022   (16) paclitaxel started 09/10/2020, repeated days 1, 8, and 15 of each 28-day cycle              (A) changed to every 2 weeks beginning with cycle 3 (11/05/2020)             (B) MRI of the abdomen 02/09/2021 shows evidence of response             (C) chest CT 02/10/2021 shows stable disease  CURRENT THERAPY: Taxol  INTERVAL HISTORY: Debbie [name] 55 y.o. female returns for evaluation prior to receiving her every 2-week Taxol.  She is doing quite well today.  She notes she is slightly more fatigued, however she was doing increased amounts of work over the holidays.  She spent an increased amount of time on her feet walking around and cooking.  She notes that due to this she had a slight increase in the frequency of her intermittent neuropathy.  It seems to have calmed down some today though.  She denies any recent fever chills or other concerns today.   Patient Active Problem List   Diagnosis Date Noted   Encounter for antineoplastic chemotherapy 10/08/2020   Lower extremity edema 10/08/2020   Lung metastases ([place]) 09/10/2020   Goals of care, counseling/discussion 12/27/2018   Osteonecrosis ([place]) 11/01/2018   Pathological fracture in neoplastic disease, left femur, initial encounter for fracture ([place]) 01/28/2018   Morbid obesity with BMI of 40.0-44.9, adult ([place]) 32/99/2426   IUD complication ([place]) 83/41/9622   Bone metastases ([place]) 08/13/2017   Pain from bone metastases ([place]) 08/13/2017   Liver metastases ([place]) 08/13/2017   Steatosis of liver 08/13/2017   Bilateral malignant neoplasm of breast in female, estrogen receptor positive ([place]) 12/26/2015   Malignant neoplasm of central portion of left breast in female, estrogen receptor positive ([place]) 11/28/2015   Malignant neoplasm of upper-inner quadrant of right breast in female, estrogen receptor positive ([place]) 10/17/2015   Hypertension 04/19/2012   Hypothyroid 04/19/2012   Fibroids 04/19/2012   Stress incontinence, female 04/19/2012    is allergic to ace inhibitors, atorvastatin, simvastatin, and percocet  [oxycodone-acetaminophen].  MEDICAL HISTORY: Past Medical History:  Diagnosis Date   Anxiety    Arthritis    Bone metastasis ([place])    Breast cancer of upper-inner quadrant of right female breast [facility]) oncologist-  dr [name]--- 04/ 2019 ER/PR+ Stage IV w/ metastatic disease to liver, total spine, lung, bone   dx 06/ 2017  right breast invasive ductal carcinoma, Stage IIB, ER/PR + (pT2 pN1), grade 1-- s/p  right breast lumpecotmy w/ sln bx's and bilateral breast reduction 11-28-2015/  left breast with reduction , dx invasive ductal ca, Grade 1 (pT1cpNX) ER/PR positive/  adjuvant radiation completed 03-27-2016 to right chest wall, 2018  reclassifiec Stage IIA   Cancer, metastatic to liver ([place])    Chronic back pain    Constipation    Depression    Family history of adverse reaction to anesthesia     mother has Copd was kept on vent    Headache    Heart murmur    History of hyperthyroidism    s/p  RAI   History of radiation therapy 02/11/16- 03/27/16   Right Chest Wall 50.4 Gy in 28 fractions, Right Chest Wall Boost 10 Gy in 5 fractions.    Hyperlipidemia  Hypertension    Hypothyroidism, postradioiodine therapy    endocrinoloigst-  dr balan--  2004  s/p  RAI   Menorrhagia 2011   Multiple pulmonary nodules    secondary  metastatic   PONV (postoperative nausea and vomiting)    patch helps    SUI (stress urinary incontinence, female)    Wears contact lenses    Wears dentures    full upper and lower partial    SURGICAL HISTORY: Past Surgical History:  Procedure Laterality Date   BREAST LUMPECTOMY WITH NEEDLE LOCALIZATION AND AXILLARY SENTINEL LYMPH NODE BX Right 11/28/2015   Procedure: RIGHT BREAST LUMPECTOMY WITH NEEDLE LOCALIZATION AND AXILLARY SENTINEL LYMPH NODE BX;  Surgeon: Autumn Messing III, MD;  Location: [facility];  Service: General;  Laterality: Right;   BREAST REDUCTION SURGERY Bilateral 11/28/2015   Procedure: MAMMARY REDUCTION  (BREAST) BILATERAL;  Surgeon: Crissie Reese, MD;   Location: [facility];  Service: Plastics;  Laterality: Bilateral;   CESAREAN SECTION  01/12/1993   COLONOSCOPY     Dental surgeries     FEMUR IM NAIL Left 01/28/2018   Procedure: INTRAMEDULLARY (IM) NAIL FEMORAL;  Surgeon: Nicholes Stairs, MD;  Location: [facility];  Service: Orthopedics;  Laterality: Left;   HYSTEROSCOPY N/A 09/02/2017   Procedure: HYSTEROSCOPY  to remove IUD;  Surgeon: Eldred Manges, MD;  Location: [facility];  Service: Gynecology;  Laterality: N/A;   IR IMAGING GUIDED PORT INSERTION  09/16/2020   Liver biospy     MASTECTOMY     MASTECTOMY W/ SENTINEL NODE BIOPSY Bilateral 12/26/2015   Procedure: BILATERAL MASTECTOMY WITH LEFT SENTINEL LYMPH NODE BIOPSY;  Surgeon: Autumn Messing III, MD;  Location: [facility];  Service: General;  Laterality: Bilateral;   Removal of IUD     TUBAL LIGATION Bilateral 1995    SOCIAL HISTORY: Social History   Socioeconomic History   Marital status: Married    Spouse name: Not on file   Number of children: Not on file   Years of education: Not on file   Highest education level: Not on file  Occupational History   Not on file  Tobacco Use   Smoking status: Never   Smokeless tobacco: Never  Vaping Use   Vaping Use: Never used  Substance and Sexual Activity   Alcohol use: No   Drug use: No   Sexual activity: Not on file    Comment: BTL   Other Topics Concern   Not on file  Social History Narrative   Not on file   Social Determinants of Health   Financial Resource Strain: Not on file  Food Insecurity: Not on file  Transportation Needs: Not on file  Physical Activity: Not on file  Stress: Not on file  Social Connections: Not on file  Intimate Partner Violence: Not on file    FAMILY HISTORY: Family History  Problem Relation Age of Onset   Diabetes Mother    Hypertension Mother    Hypertension Father    Diabetes Maternal Grandmother    Hypertension Sister    Cancer Paternal Grandmother        colon   Colon  cancer Paternal Grandmother    Esophageal cancer Neg Hx    Stomach cancer Neg Hx    Rectal cancer Neg Hx     Review of Systems  Constitutional:  Positive for fatigue. Negative for appetite change, chills, fever and unexpected weight change.  HENT:   Negative for hearing loss, lump/mass and trouble swallowing.  Eyes:  Negative for eye problems and icterus.  Respiratory:  Negative for chest tightness, cough and shortness of breath.   Cardiovascular:  Negative for chest pain, leg swelling and palpitations.  Gastrointestinal:  Negative for abdominal distention, abdominal pain, constipation, diarrhea, nausea and vomiting.  Endocrine: Negative for hot flashes.  Genitourinary:  Negative for difficulty urinating.   Musculoskeletal:  Negative for arthralgias.  Skin:  Negative for itching and rash.  Neurological:  Positive for numbness. Negative for dizziness, extremity weakness and headaches.  Hematological:  Negative for adenopathy. Does not bruise/bleed easily.  Psychiatric/Behavioral:  Negative for depression. The patient is not nervous/anxious.      PHYSICAL EXAMINATION  ECOG PERFORMANCE STATUS: 1 - Symptomatic but completely ambulatory  Vitals:   05/06/21 0833  BP: (!) 168/67  Pulse: 87  Resp: 16  Temp: 98.8 F (37.1 C)  SpO2: 96%    Physical Exam Constitutional:      General: She is not in acute distress.    Appearance: Normal appearance. She is not toxic-appearing.  HENT:     Head: Normocephalic and atraumatic.  Eyes:     General: No scleral icterus. Cardiovascular:     Rate and Rhythm: Normal rate and regular rhythm.     Pulses: Normal pulses.     Heart sounds: Normal heart sounds.  Pulmonary:     Effort: Pulmonary effort is normal.     Breath sounds: Normal breath sounds.  Abdominal:     General: Abdomen is flat. Bowel sounds are normal. There is no distension.     Palpations: Abdomen is soft.     Tenderness: There is no abdominal tenderness.  Musculoskeletal:         General: No swelling.     Cervical back: Neck supple.  Lymphadenopathy:     Cervical: No cervical adenopathy.  Skin:    General: Skin is warm and dry.     Findings: No rash.  Neurological:     General: No focal deficit present.     Mental Status: She is alert.  Psychiatric:        Mood and Affect: Mood normal.        Behavior: Behavior normal.    LABORATORY DATA:  CBC    Component Value Date/Time   WBC 4.5 05/06/2021 0818   RBC 3.57 (L) 05/06/2021 0818   HGB 10.3 (L) 05/06/2021 0818   HGB 12.2 08/22/2020 1050   HGB 13.2 07/08/2016 0948   HCT 31.7 (L) 05/06/2021 0818   HCT 39.9 07/08/2016 0948   PLT 278 05/06/2021 0818   PLT 243 08/22/2020 1050   PLT 276 07/08/2016 0948   MCV 88.8 05/06/2021 0818   MCV 88.1 07/08/2016 0948   MCH 28.9 05/06/2021 0818   MCHC 32.5 05/06/2021 0818   RDW 19.1 (H) 05/06/2021 0818   RDW 14.4 07/08/2016 0948   LYMPHSABS 0.6 (L) 05/06/2021 0818   LYMPHSABS 1.1 07/08/2016 0948   MONOABS 0.5 05/06/2021 0818   MONOABS 0.4 07/08/2016 0948   EOSABS 0.2 05/06/2021 0818   EOSABS 0.1 07/08/2016 0948   BASOSABS 0.0 05/06/2021 0818   BASOSABS 0.0 07/08/2016 0948    CMP     Component Value Date/Time   NA 141 05/06/2021 0818   NA 140 07/08/2016 0948   K 3.7 05/06/2021 0818   K 4.1 07/08/2016 0948   CL 103 05/06/2021 0818   CO2 31 05/06/2021 0818   CO2 24 07/08/2016 0948   GLUCOSE 94 05/06/2021 0818   GLUCOSE  89 07/08/2016 0948   BUN 15 05/06/2021 0818   BUN 11.9 07/08/2016 0948   CREATININE 0.83 05/06/2021 0818   CREATININE 0.83 04/08/2021 0801   CREATININE 0.9 07/08/2016 0948   CALCIUM 9.6 05/06/2021 0818   CALCIUM 10.0 07/08/2016 0948   PROT 6.5 05/06/2021 0818   PROT 7.6 07/08/2016 0948   ALBUMIN 3.6 05/06/2021 0818   ALBUMIN 3.6 07/08/2016 0948   AST 17 05/06/2021 0818   AST 22 04/08/2021 0801   AST 22 07/08/2016 0948   ALT 17 05/06/2021 0818   ALT 21 04/08/2021 0801   ALT 27 07/08/2016 0948   ALKPHOS 118 05/06/2021  0818   ALKPHOS 115 07/08/2016 0948   BILITOT 0.4 05/06/2021 0818   BILITOT 0.5 04/08/2021 0801   BILITOT 0.35 07/08/2016 0948   GFRNONAA >60 05/06/2021 0818   GFRNONAA >60 04/08/2021 0801   GFRAA >60 01/30/2020 0814   GFRAA >60 05/16/2019 0905      ASSESSMENT and THERAPY PLAN:   Malignant neoplasm of upper-inner quadrant of right breast in female, estrogen receptor positive (HCC) Debbie [name] is a 55 year old [name] who is here today for follow-up of her metastatic breast cancer prior to receiving her every 2-week paclitaxel chemotherapy.  1.  Metastatic breast cancer: She continues following chemotherapy with every 2-week Taxol.  Her most recent scans were an MRI of the liver and CT of the chest which was completed on November 22.  These appear grossly stable.  She will undergo follow-up in January after discussion with Dr. Lindi Adie  #2 peripheral neuropathy: We are following this closely.  This has been stable on her Taxol given every 2 weeks.  She will let us know if it worsens.  Right now it continues to be intermittent with no motor deficits.  Debbie [name] remains active and working with intermittent FMLA as needed secondary to side effects from her chemotherapy and to be able to come in for appointments and follow-up should she have any needs.  I encouraged her to send her paperwork to [place] so we can complete it.  Debbie [name] will return in 2 weeks for labs, follow-up with Dr. Lindi Adie, and her next treatment.   No orders of the defined types were placed in this encounter.   All questions were answered. The patient knows to call the clinic with any problems, questions or concerns. We can certainly see the patient much sooner if necessary.  Total encounter time: 30 minutes in face-to-face visit time, chart review, lab review, care coordination, documentation of encounter.   Wilber Bihari, NP 05/06/21 9:12 AM Medical Oncology and Hematology [facility] [place],  [address] Tel. [phone]    Fax. [phone]  *Total Encounter Time as defined by the Centers for Medicare and Medicaid Services includes, in addition to the face-to-face time of a patient visit (documented in the note above) non-face-to-face time: obtaining and reviewing outside history, ordering and reviewing medications, tests or procedures, care coordination (communications with other health care professionals or caregivers) and documentation in the medical record.

## 2021-05-06 ENCOUNTER — Other Ambulatory Visit: Payer: Self-pay

## 2021-05-06 ENCOUNTER — Inpatient Hospital Stay: Payer: 59

## 2021-05-06 ENCOUNTER — Encounter: Payer: Self-pay | Admitting: Adult Health

## 2021-05-06 ENCOUNTER — Inpatient Hospital Stay: Payer: 59 | Admitting: Adult Health

## 2021-05-06 VITALS — BP 168/67 | HR 87 | Temp 98.8°F | Resp 16 | Ht 66.0 in | Wt 296.6 lb

## 2021-05-06 DIAGNOSIS — Z5111 Encounter for antineoplastic chemotherapy: Secondary | ICD-10-CM | POA: Diagnosis not present

## 2021-05-06 DIAGNOSIS — C50211 Malignant neoplasm of upper-inner quadrant of right female breast: Secondary | ICD-10-CM

## 2021-05-06 DIAGNOSIS — C50112 Malignant neoplasm of central portion of left female breast: Secondary | ICD-10-CM | POA: Diagnosis not present

## 2021-05-06 DIAGNOSIS — C7800 Secondary malignant neoplasm of unspecified lung: Secondary | ICD-10-CM | POA: Diagnosis not present

## 2021-05-06 DIAGNOSIS — C787 Secondary malignant neoplasm of liver and intrahepatic bile duct: Secondary | ICD-10-CM

## 2021-05-06 DIAGNOSIS — Z170 Estrogen receptor positive status [ER+]: Secondary | ICD-10-CM

## 2021-05-06 DIAGNOSIS — Z95828 Presence of other vascular implants and grafts: Secondary | ICD-10-CM

## 2021-05-06 DIAGNOSIS — C7951 Secondary malignant neoplasm of bone: Secondary | ICD-10-CM | POA: Diagnosis not present

## 2021-05-06 DIAGNOSIS — Z9013 Acquired absence of bilateral breasts and nipples: Secondary | ICD-10-CM | POA: Diagnosis not present

## 2021-05-06 DIAGNOSIS — G629 Polyneuropathy, unspecified: Secondary | ICD-10-CM | POA: Diagnosis not present

## 2021-05-06 DIAGNOSIS — Z79899 Other long term (current) drug therapy: Secondary | ICD-10-CM | POA: Diagnosis not present

## 2021-05-06 DIAGNOSIS — Z923 Personal history of irradiation: Secondary | ICD-10-CM | POA: Diagnosis not present

## 2021-05-06 LAB — COMPREHENSIVE METABOLIC PANEL
ALT: 17 U/L (ref 0–44)
AST: 17 U/L (ref 15–41)
Albumin: 3.6 g/dL (ref 3.5–5.0)
Alkaline Phosphatase: 118 U/L (ref 38–126)
Anion gap: 7 (ref 5–15)
BUN: 15 mg/dL (ref 6–20)
CO2: 31 mmol/L (ref 22–32)
Calcium: 9.6 mg/dL (ref 8.9–10.3)
Chloride: 103 mmol/L (ref 98–111)
Creatinine, Ser: 0.83 mg/dL (ref 0.44–1.00)
GFR, Estimated: >60 mL/min (ref >60)
Glucose, Bld: 94 mg/dL (ref 70–99)
Potassium: 3.7 mmol/L (ref 3.5–5.1)
Sodium: 141 mmol/L (ref 135–145)
Total Bilirubin: 0.4 mg/dL (ref 0.3–1.2)
Total Protein: 6.5 g/dL (ref 6.5–8.1)

## 2021-05-06 LAB — CBC WITH DIFFERENTIAL/PLATELET
Abs Immature Granulocytes: 0.02 10*3/uL (ref 0.00–0.07)
Basophils Absolute: 0 10*3/uL (ref 0.0–0.1)
Basophils Relative: 0 %
Eosinophils Absolute: 0.2 10*3/uL (ref 0.0–0.5)
Eosinophils Relative: 3 %
HCT: 31.7 % — ABNORMAL LOW (ref 36.0–46.0)
Hemoglobin: 10.3 g/dL — ABNORMAL LOW (ref 12.0–15.0)
Immature Granulocytes: 0 %
Lymphocytes Relative: 12 %
Lymphs Abs: 0.6 10*3/uL — ABNORMAL LOW (ref 0.7–4.0)
MCH: 28.9 pg (ref 26.0–34.0)
MCHC: 32.5 g/dL (ref 30.0–36.0)
MCV: 88.8 fL (ref 80.0–100.0)
Monocytes Absolute: 0.5 10*3/uL (ref 0.1–1.0)
Monocytes Relative: 12 %
Neutro Abs: 3.2 10*3/uL (ref 1.7–7.7)
Neutrophils Relative %: 73 %
Platelets: 278 10*3/uL (ref 150–400)
RBC: 3.57 MIL/uL — ABNORMAL LOW (ref 3.87–5.11)
RDW: 19.1 % — ABNORMAL HIGH (ref 11.5–15.5)
WBC: 4.5 10*3/uL (ref 4.0–10.5)
nRBC: 0 % (ref 0.0–0.2)

## 2021-05-06 MED ORDER — FAMOTIDINE 20 MG IN NS 100 ML IVPB
20.0000 mg | Freq: Once | INTRAVENOUS | Status: AC
  Administered 2021-05-06: 09:00:00 20 mg via INTRAVENOUS
  Filled 2021-05-06: qty 100

## 2021-05-06 MED ORDER — SODIUM CHLORIDE 0.9% FLUSH
10.0000 mL | INTRAVENOUS | Status: DC | PRN
  Administered 2021-05-06: 12:00:00 10 mL

## 2021-05-06 MED ORDER — HEPARIN SOD (PORK) LOCK FLUSH 100 UNIT/ML IV SOLN
500.0000 [IU] | Freq: Once | INTRAVENOUS | Status: AC | PRN
  Administered 2021-05-06: 12:00:00 500 [IU]

## 2021-05-06 MED ORDER — DENOSUMAB 120 MG/1.7ML ~~LOC~~ SOLN
120.0000 mg | Freq: Once | SUBCUTANEOUS | Status: AC
  Administered 2021-05-06: 10:00:00 120 mg via SUBCUTANEOUS
  Filled 2021-05-06: qty 1.7

## 2021-05-06 MED ORDER — DIPHENHYDRAMINE HCL 50 MG/ML IJ SOLN
25.0000 mg | Freq: Once | INTRAMUSCULAR | Status: AC
  Administered 2021-05-06: 09:00:00 25 mg via INTRAVENOUS
  Filled 2021-05-06: qty 1

## 2021-05-06 MED ORDER — SODIUM CHLORIDE 0.9% FLUSH
10.0000 mL | INTRAVENOUS | Status: DC | PRN
  Administered 2021-05-06: 08:00:00 10 mL via INTRAVENOUS

## 2021-05-06 MED ORDER — SODIUM CHLORIDE 0.9 % IV SOLN
80.0000 mg/m2 | Freq: Once | INTRAVENOUS | Status: AC
  Administered 2021-05-06: 11:00:00 192 mg via INTRAVENOUS
  Filled 2021-05-06: qty 32

## 2021-05-06 MED ORDER — SODIUM CHLORIDE 0.9 % IV SOLN
10.0000 mg | Freq: Once | INTRAVENOUS | Status: AC
  Administered 2021-05-06: 10:00:00 10 mg via INTRAVENOUS
  Filled 2021-05-06: qty 10

## 2021-05-06 MED ORDER — SODIUM CHLORIDE 0.9 % IV SOLN
Freq: Once | INTRAVENOUS | Status: AC
Start: 2021-05-06 — End: 2021-05-06

## 2021-05-06 NOTE — Patient Instructions (Signed)
[facility]  Discharge Instructions: Thank you for choosing [facility] to provide your oncology and hematology care.   If you have a lab appointment with the [facility], please go directly to the [facility] and check in at the registration area.   Wear comfortable clothing and clothing appropriate for easy access to any Portacath or PICC line.   We strive to give you quality time with your provider. You may need to reschedule your appointment if you arrive late (15 or more minutes).  Arriving late affects you and other patients whose appointments are after yours.  Also, if you miss three or more appointments without notifying the office, you may be dismissed from the clinic at the providers discretion.      For prescription refill requests, have your pharmacy contact our office and allow 72 hours for refills to be completed.    Today you received the following chemotherapy and/or immunotherapy agents: Taxol   To help prevent nausea and vomiting after your treatment, we encourage you to take your nausea medication as directed.  BELOW ARE SYMPTOMS THAT SHOULD BE REPORTED IMMEDIATELY: *FEVER GREATER THAN 100.4 F (38 C) OR HIGHER *CHILLS OR SWEATING *NAUSEA AND VOMITING THAT IS NOT CONTROLLED WITH YOUR NAUSEA MEDICATION *UNUSUAL SHORTNESS OF BREATH *UNUSUAL BRUISING OR BLEEDING *URINARY PROBLEMS (pain or burning when urinating, or frequent urination) *BOWEL PROBLEMS (unusual diarrhea, constipation, pain near the anus) TENDERNESS IN MOUTH AND THROAT WITH OR WITHOUT PRESENCE OF ULCERS (sore throat, sores in mouth, or a toothache) UNUSUAL RASH, SWELLING OR PAIN  UNUSUAL VAGINAL DISCHARGE OR ITCHING   Items with * indicate a potential emergency and should be followed up as soon as possible or go to the Emergency Department if any problems should occur.  Please show the CHEMOTHERAPY ALERT CARD or IMMUNOTHERAPY ALERT CARD at check-in to the  Emergency Department and triage nurse.  Should you have questions after your visit or need to cancel or reschedule your appointment, please contact [facility]  Dept: [phone]  and follow the prompts.  Office hours are 8:00 a.m. to 4:30 p.m. Monday - Friday. Please note that voicemails left after 4:00 p.m. may not be returned until the following business day.  We are closed weekends and major holidays. You have access to a nurse at all times for urgent questions. Please call the main number to the clinic Dept: [phone] and follow the prompts.   For any non-urgent questions, you may also contact your provider using MyChart. We now offer e-Visits for anyone 41 and older to request care online for non-urgent symptoms. For details visit [email or web address].   Also download the MyChart app! Go to the app store, search "MyChart", open the app, select [facility], and log in with your MyChart username and password.  Due to Covid, a mask is required upon entering the hospital/clinic. If you do not have a mask, one will be given to you upon arrival. For doctor visits, patients may have 1 support person aged 79 or older with them. For treatment visits, patients cannot have anyone with them due to current Covid guidelines and our immunocompromised population.

## 2021-05-06 NOTE — Assessment & Plan Note (Signed)
Debbie [name] is a 55 year old woman who is here today for follow-up of her metastatic breast cancer prior to receiving her every 2-week paclitaxel chemotherapy.  1.  Metastatic breast cancer: She continues following chemotherapy with every 2-week Taxol.  Her most recent scans were an MRI of the liver and CT of the chest which was completed on November 22.  These appear grossly stable.  She will undergo follow-up in January after discussion with Dr. Lindi Adie  #2 peripheral neuropathy: We are following this closely.  This has been stable on her Taxol given every 2 weeks.  She will let us know if it worsens.  Right now it continues to be intermittent with no motor deficits.  Debbie [name] remains active and working with intermittent FMLA as needed secondary to side effects from her chemotherapy and to be able to come in for appointments and follow-up should she have any needs.  I encouraged her to send her paperwork to [place] so we can complete it.  Debbie [name] will return in 2 weeks for labs, follow-up with Dr. Lindi Adie, and her next treatment.

## 2021-05-07 LAB — CANCER ANTIGEN 27.29: CA 27.29: 288.7 U/mL — ABNORMAL HIGH (ref 0.0–38.6)

## 2021-05-08 ENCOUNTER — Other Ambulatory Visit: Payer: Self-pay | Admitting: Oncology

## 2021-05-08 ENCOUNTER — Encounter: Payer: 59 | Admitting: Rehabilitation

## 2021-05-08 ENCOUNTER — Other Ambulatory Visit (HOSPITAL_COMMUNITY): Payer: Self-pay

## 2021-05-08 MED ORDER — DOXYCYCLINE HYCLATE 100 MG PO TABS
100.0000 mg | ORAL_TABLET | Freq: Every day | ORAL | 2 refills | Status: DC
  Filled 2021-05-08: qty 30, 30d supply, fill #0
  Filled 2021-06-05: qty 30, 30d supply, fill #1
  Filled 2021-07-08: qty 30, 30d supply, fill #2

## 2021-05-08 MED FILL — Sertraline HCl Tab 100 MG: ORAL | 30 days supply | Qty: 30 | Fill #8 | Status: AC

## 2021-05-20 NOTE — Assessment & Plan Note (Signed)
Metastatic Breast Cancer: ER Pos and Her 2 Positive:  Current Treatment: Taxol since 09/10/2020  Toxicities:  Surveillance:

## 2021-05-21 ENCOUNTER — Inpatient Hospital Stay: Payer: 59

## 2021-05-21 ENCOUNTER — Inpatient Hospital Stay: Payer: 59 | Attending: Oncology

## 2021-05-21 ENCOUNTER — Other Ambulatory Visit: Payer: Self-pay

## 2021-05-21 ENCOUNTER — Inpatient Hospital Stay: Payer: 59 | Admitting: Hematology and Oncology

## 2021-05-21 ENCOUNTER — Other Ambulatory Visit: Payer: Self-pay | Admitting: Hematology and Oncology

## 2021-05-21 DIAGNOSIS — C787 Secondary malignant neoplasm of liver and intrahepatic bile duct: Secondary | ICD-10-CM | POA: Diagnosis not present

## 2021-05-21 DIAGNOSIS — C50211 Malignant neoplasm of upper-inner quadrant of right female breast: Secondary | ICD-10-CM

## 2021-05-21 DIAGNOSIS — C7951 Secondary malignant neoplasm of bone: Secondary | ICD-10-CM | POA: Insufficient documentation

## 2021-05-21 DIAGNOSIS — R5383 Other fatigue: Secondary | ICD-10-CM | POA: Diagnosis not present

## 2021-05-21 DIAGNOSIS — T451X5A Adverse effect of antineoplastic and immunosuppressive drugs, initial encounter: Secondary | ICD-10-CM | POA: Diagnosis not present

## 2021-05-21 DIAGNOSIS — Z170 Estrogen receptor positive status [ER+]: Secondary | ICD-10-CM | POA: Diagnosis not present

## 2021-05-21 DIAGNOSIS — Z79899 Other long term (current) drug therapy: Secondary | ICD-10-CM | POA: Insufficient documentation

## 2021-05-21 DIAGNOSIS — C50112 Malignant neoplasm of central portion of left female breast: Secondary | ICD-10-CM

## 2021-05-21 DIAGNOSIS — G629 Polyneuropathy, unspecified: Secondary | ICD-10-CM | POA: Insufficient documentation

## 2021-05-21 DIAGNOSIS — D6481 Anemia due to antineoplastic chemotherapy: Secondary | ICD-10-CM | POA: Diagnosis not present

## 2021-05-21 DIAGNOSIS — Z5111 Encounter for antineoplastic chemotherapy: Secondary | ICD-10-CM | POA: Diagnosis not present

## 2021-05-21 LAB — COMPREHENSIVE METABOLIC PANEL
ALT: 22 U/L (ref 0–44)
AST: 22 U/L (ref 15–41)
Albumin: 3.5 g/dL (ref 3.5–5.0)
Alkaline Phosphatase: 121 U/L (ref 38–126)
Anion gap: 6 (ref 5–15)
BUN: 13 mg/dL (ref 6–20)
CO2: 32 mmol/L (ref 22–32)
Calcium: 8.9 mg/dL (ref 8.9–10.3)
Chloride: 103 mmol/L (ref 98–111)
Creatinine, Ser: 0.77 mg/dL (ref 0.44–1.00)
GFR, Estimated: >60 mL/min (ref >60)
Glucose, Bld: 94 mg/dL (ref 70–99)
Potassium: 3.2 mmol/L — ABNORMAL LOW (ref 3.5–5.1)
Sodium: 141 mmol/L (ref 135–145)
Total Bilirubin: 0.3 mg/dL (ref 0.3–1.2)
Total Protein: 6.7 g/dL (ref 6.5–8.1)

## 2021-05-21 LAB — CBC WITH DIFFERENTIAL/PLATELET
Abs Immature Granulocytes: 0.03 10*3/uL (ref 0.00–0.07)
Basophils Absolute: 0 10*3/uL (ref 0.0–0.1)
Basophils Relative: 1 %
Eosinophils Absolute: 0.1 10*3/uL (ref 0.0–0.5)
Eosinophils Relative: 2 %
HCT: 31.9 % — ABNORMAL LOW (ref 36.0–46.0)
Hemoglobin: 10.4 g/dL — ABNORMAL LOW (ref 12.0–15.0)
Immature Granulocytes: 1 %
Lymphocytes Relative: 11 %
Lymphs Abs: 0.6 10*3/uL — ABNORMAL LOW (ref 0.7–4.0)
MCH: 28.9 pg (ref 26.0–34.0)
MCHC: 32.6 g/dL (ref 30.0–36.0)
MCV: 88.6 fL (ref 80.0–100.0)
Monocytes Absolute: 0.6 10*3/uL (ref 0.1–1.0)
Monocytes Relative: 10 %
Neutro Abs: 4.2 10*3/uL (ref 1.7–7.7)
Neutrophils Relative %: 75 %
Platelets: 246 10*3/uL (ref 150–400)
RBC: 3.6 MIL/uL — ABNORMAL LOW (ref 3.87–5.11)
RDW: 18.9 % — ABNORMAL HIGH (ref 11.5–15.5)
WBC: 5.5 10*3/uL (ref 4.0–10.5)
nRBC: 0 % (ref 0.0–0.2)

## 2021-05-21 MED ORDER — SODIUM CHLORIDE 0.9 % IV SOLN
80.0000 mg/m2 | Freq: Once | INTRAVENOUS | Status: AC
  Administered 2021-05-21: 192 mg via INTRAVENOUS
  Filled 2021-05-21: qty 32

## 2021-05-21 MED ORDER — SODIUM CHLORIDE 0.9% FLUSH
10.0000 mL | INTRAVENOUS | Status: DC | PRN
  Administered 2021-05-21: 10 mL

## 2021-05-21 MED ORDER — FAMOTIDINE 20 MG IN NS 100 ML IVPB
20.0000 mg | Freq: Once | INTRAVENOUS | Status: AC
  Administered 2021-05-21: 20 mg via INTRAVENOUS
  Filled 2021-05-21: qty 100

## 2021-05-21 MED ORDER — SODIUM CHLORIDE 0.9 % IV SOLN
10.0000 mg | Freq: Once | INTRAVENOUS | Status: AC
  Administered 2021-05-21: 10 mg via INTRAVENOUS
  Filled 2021-05-21: qty 10

## 2021-05-21 MED ORDER — SODIUM CHLORIDE 0.9% FLUSH
10.0000 mL | INTRAVENOUS | Status: AC | PRN
  Administered 2021-05-21: 10 mL

## 2021-05-21 MED ORDER — DIPHENHYDRAMINE HCL 50 MG/ML IJ SOLN
25.0000 mg | Freq: Once | INTRAMUSCULAR | Status: AC
  Administered 2021-05-21: 25 mg via INTRAVENOUS

## 2021-05-21 MED ORDER — SODIUM CHLORIDE 0.9 % IV SOLN: Freq: Once | INTRAVENOUS | Status: AC

## 2021-05-21 MED ORDER — HEPARIN SOD (PORK) LOCK FLUSH 100 UNIT/ML IV SOLN
500.0000 [IU] | Freq: Once | INTRAVENOUS | Status: AC | PRN
  Administered 2021-05-21: 500 [IU]

## 2021-05-21 NOTE — Progress Notes (Addendum)
Patient Care Team: Kelton Pillar, MD as PCP - General (Family Medicine) Jacelyn Pi, MD as Consulting Physician (Endocrinology) Jovita Kussmaul, MD as Consulting Physician (General Surgery) [name], MD as Attending Physician (Radiation Oncology) Belva Crome, MD as Consulting Physician (Cardiology) [name] [name], NP as Nurse Practitioner (Hematology and Oncology) Lajoyce Lauber, DMD (Dentistry) Raina Mina, RPH-CPP (Pharmacist) Nicholas Lose, MD as Consulting Physician (Hematology and Oncology)  DIAGNOSIS:  Encounter Diagnosis  Name Primary?   Malignant neoplasm of upper-inner quadrant of right breast in female, estrogen receptor positive ([place])     SUMMARY OF ONCOLOGIC HISTORY: Oncology History  Malignant neoplasm of upper-inner quadrant of right breast in female, estrogen receptor positive ([place])  10/15/2015 Initial Biopsy   Right breast upper inner quadrant biopsy: IDC, DCIS, grade 2, ER+(90%), PR+(50%), Ki67 70%, HER-2 negative (ratio 1.18).   10/17/2015 Initial Diagnosis   Malignant neoplasm of upper-inner quadrant of right breast in female, estrogen receptor positive ([place])   10/2015 -  Anti-estrogen oral therapy   Tamoxifen daily   11/28/2015 Surgery   Left breast mammoplasty: IDC, grade 1, 1.2cm, DCIS, lobular neoplasia, T1c, Nx, ER 80%, PR60%, Ki67 3% HER-2 neg Right breast lumpectomy: IDC, grade 1, 2.3cm, DCIS. T2N1a ER/PR+HER-2 neg, Ki 70%.   11/28/2015 Miscellaneous   Mammaprint was Low Risk. Predicting a 98% chance of no disease recurrence within 5 years with anti-estrogens as the only systemic therapy.  It also predicts minimal to no benefit from chemotherapy.     12/26/2015 Surgery   Bilateral mastectomies: bilateral sentinel node sampling, Right: no residual carcinoma, left, focal DCIS, 4 SLN negative   02/11/2016 - 03/27/2016 Radiation Therapy   Adjuvant radiation [name]): 1) Right chest wall: 50.4 Gy in 28 fractions  2) Right chest  wall boost: 10 Gy in 5 fractions   08/04/2017 Relapse/Recurrence   08/04/2017, MRI of the liver and total spine MRI 08/11/2017 showed liver and bone metastasis; liver biopsy 09/07/2017 confirms estrogen receptor positive, progesterone receptor negative, HER-2 negative metastatic breast cancer; chest CT scan 08/13/2017 shows multiple bilateral pulmonary nodules.   08/17/2017 - 01/04/2018 Anti-estrogen oral therapy   fulvestrant  palbociclib started 08/31/2017 discontinued 01/04/2018 with evidence of progression in the liver   08/2017 Miscellaneous   [facility] report on liver biopsy from April 2019 shows a negative PDL 1, stable MSI and proficient mismatch repair status.  The tumor is positive for the androgen receptor, and for PTEN for PIK3 CA mutations.  HER-2/neu was read as equivocal (it was negative by FISH on the pathology report).   01/04/2018 Miscellaneous   anastrozole started 01/04/2018 everolimus started 01/29/2018   02/16/2018 - 03/01/2018 Radiation Therapy   01/28/18 prophylactic left femur intramedullary nail surgery for impending left femur pathologic fracture alliative radiation with Dr. [name] to the left femur and left ilium from 02/16/2018 to 03/01/2018   01/30/2020 - 05/2020 Anti-estrogen oral therapy   alpelisib started 01/30/2020 at 300 mg daily with Metformin 500 mg daily and Faslodex (stopped for progression)   05/22/2020 - 08/2020 Chemotherapy   Xeloda discontinued for progression   09/10/2020 -  Chemotherapy   Patient is on Treatment Plan : BREAST Paclitaxel D1,D15 q28d       CHIEF COMPLIANT: Follow-up of metastatic breast cancer on Taxol  INTERVAL HISTORY: Debbie [name] is a 56 year old with above-mentioned history of metastatic breast cancer has been on multiple lines of treatment and is currently receiving Taxol treatments.  She is tolerating Taxol reasonably well.  She does  have mild peripheral neuropathy that has not changed at all.  She is also complaining of severe  dryness of her skin and some fatigue.  She denies any nausea or vomiting.   ALLERGIES:  is allergic to ace inhibitors, atorvastatin, simvastatin, and percocet [oxycodone-acetaminophen].  MEDICATIONS:  Current Outpatient Medications  Medication Sig Dispense Refill   acetaminophen (TYLENOL) 500 MG tablet Take 1,000 mg by mouth every 6 (six) hours as needed.     Calcium-Magnesium 250-125 MG TABS Take 1 tablet by mouth daily. 120 each 4   cetirizine (ZYRTEC) 10 MG tablet Take 10 mg by mouth daily as needed for allergies.      chlorhexidine (PERIDEX) 0.12 % solution Use 5 mLs in the mouth or throat 2 (two) times daily as needed as directed. Do not swallow 473 mL 6   doxycycline (VIBRA-TABS) 100 MG tablet Take 1 tablet (100 mg total) by mouth daily. 30 tablet 2   furosemide (LASIX) 20 MG tablet Take 1 tablet (20 mg total) by mouth daily. 90 tablet 6   levothyroxine (SYNTHROID) 175 MCG tablet Take 1 tablet (175 mcg total) by mouth in the morning on an empty stomach 30 tablet 6   lidocaine-prilocaine (EMLA) cream Apply to affected area once 30 g 3   losartan-hydrochlorothiazide (HYZAAR) 100-25 MG tablet Take 1 tablet by mouth once a day 90 tablet 2   Multiple Vitamin (MULTIVITAMIN) tablet Take 1 tablet by mouth daily.     omega-3 acid ethyl esters (LOVAZA) 1 g capsule Take 1 g by mouth 2 (two) times daily.      omeprazole (PRILOSEC) 40 MG capsule Take 1 capsule (40 mg total) by mouth daily. 30 capsule 3   oxyCODONE (OXY IR/ROXICODONE) 5 MG immediate release tablet Take 1 tablet (5 mg total) by mouth every 4 (four) hours as needed for severe pain. 60 tablet 0   potassium chloride SA (KLOR-CON) 20 MEQ tablet Take 2 tablets (40 mEq total) by mouth daily. 90 tablet 4   pregabalin (LYRICA) 100 MG capsule Take 1 capsule (100 mg total) by mouth 3 (three) times daily. 90 capsule 1   prochlorperazine (COMPAZINE) 10 MG tablet Take 1 tablet (10 mg total) by mouth every 6 (six) hours as needed (Nausea or  vomiting). 30 tablet 1   rosuvastatin (CRESTOR) 5 MG tablet Take 1 tablet (5 mg total) by mouth daily. 30 tablet 1   sertraline (ZOLOFT) 100 MG tablet TAKE 1 TABLET BY MOUTH ONCE A DAY 30 tablet 12   valACYclovir (VALTREX) 1000 MG tablet Take 1 tablet (1,000 mg total) by mouth daily. 60 tablet 6   Vitamin D, Cholecalciferol, 1000 units CAPS Take 1 tablet by mouth daily. (Patient taking differently: Take 1,000 Units by mouth daily.) 90 capsule 4   No current facility-administered medications for this visit.    PHYSICAL EXAMINATION: ECOG PERFORMANCE STATUS: 1 - Symptomatic but completely ambulatory  Vitals:   05/21/21 0830  BP: 122/71  Pulse: 96  Resp: 17  Temp: (!) 97.5 F (36.4 C)  SpO2: 98%   Filed Weights   05/21/21 0830  Weight: 296 lb 1.6 oz (134.3 kg)      LABORATORY DATA:  I have reviewed the data as listed CMP Latest Ref Rng & Units 05/06/2021 04/24/2021 04/15/2021  Glucose 70 - 99 mg/dL 94 103(H) 101(H)  BUN 6 - 20 mg/dL _0 Creatinine 0.44 - 1.00 mg/dL 0.83 0.83 0.79  Sodium 135 - 145 mmol/L 141 141 140  Potassium 3.5 -  5.1 mmol/L 3.7 3.0(L) 3.4(L)  Chloride 98 - 111 mmol/L 103 102 102  CO2 22 - 32 mmol/L _0 Calcium 8.9 - 10.3 mg/dL 9.6 9.0 9.2  Total Protein 6.5 - 8.1 g/dL 6.5 6.8 6.7  Total Bilirubin 0.3 - 1.2 mg/dL 0.4 0.4 0.4  Alkaline Phos 38 - 126 U/L 118 122 121  AST 15 - 41 U/L _1 ALT 0 - 44 U/L _2 Lab Results  Component Value Date   WBC 5.5 05/21/2021   HGB 10.4 (L) 05/21/2021   HCT 31.9 (L) 05/21/2021   MCV 88.6 05/21/2021   PLT 246 05/21/2021   NEUTROABS 4.2 05/21/2021    ASSESSMENT & PLAN:  Malignant neoplasm of upper-inner quadrant of right breast in female, estrogen receptor positive ([place]) Metastatic Breast Cancer: ER Pos and Her 2 Neg Current Treatment: Taxol since 09/10/2020  Toxicities: Mild peripheral neuropathy: Stable Chemotherapy-induced anemia: Stable hemoglobin today 10.4 Mild fatigue  Chronic  right arm lymphedema  Surveillance: Mended that we obtain a CT chest abdomen pelvis in February to assess response to treatment. The previous CT scan done in November showed slight progression of the lung nodules. If there is any evidence of progression of disease and we would consider switching her to a different treatment.   No orders of the defined types were placed in this encounter.  The patient has a good understanding of the overall plan. she agrees with it. she will call with any problems that may develop before the next visit here. Total time spent: 90 mins including face to face time and time spent for planning, charting and co-ordination of care   Harriette Ohara, MD 05/21/21

## 2021-05-22 ENCOUNTER — Encounter: Payer: 59 | Admitting: Rehabilitation

## 2021-05-22 ENCOUNTER — Telehealth: Payer: Self-pay

## 2021-05-22 LAB — CANCER ANTIGEN 27.29: CA 27.29: 315.7 U/mL — ABNORMAL HIGH (ref 0.0–38.6)

## 2021-05-22 NOTE — Telephone Encounter (Signed)
Called pt, per Debbie [name] potassium is a bit low.  Encouraged pt to increase intake of potassium rich foods.  Pt verbalized understanding and thanks.

## 2021-05-27 ENCOUNTER — Other Ambulatory Visit: Payer: Self-pay

## 2021-05-28 ENCOUNTER — Other Ambulatory Visit (HOSPITAL_COMMUNITY): Payer: Self-pay

## 2021-05-29 ENCOUNTER — Other Ambulatory Visit (HOSPITAL_COMMUNITY): Payer: Self-pay

## 2021-05-29 ENCOUNTER — Other Ambulatory Visit: Payer: Self-pay | Admitting: Oncology

## 2021-05-30 ENCOUNTER — Other Ambulatory Visit (HOSPITAL_COMMUNITY): Payer: Self-pay

## 2021-05-30 MED ORDER — ROSUVASTATIN CALCIUM 5 MG PO TABS
5.0000 mg | ORAL_TABLET | Freq: Every day | ORAL | 1 refills | Status: DC
Start: 2021-05-30 — End: 2021-11-20
  Filled 2021-05-30: qty 90, 90d supply, fill #0
  Filled 2021-08-28: qty 90, 90d supply, fill #1

## 2021-06-02 MED FILL — Dexamethasone Sodium Phosphate Inj 100 MG/10ML: INTRAMUSCULAR | Qty: 1 | Status: AC

## 2021-06-03 ENCOUNTER — Inpatient Hospital Stay: Payer: 59

## 2021-06-03 ENCOUNTER — Other Ambulatory Visit: Payer: Self-pay | Admitting: Hematology and Oncology

## 2021-06-03 ENCOUNTER — Other Ambulatory Visit: Payer: Self-pay

## 2021-06-03 VITALS — BP 140/71 | HR 88 | Temp 98.6°F | Resp 18 | Wt 295.0 lb

## 2021-06-03 DIAGNOSIS — Z170 Estrogen receptor positive status [ER+]: Secondary | ICD-10-CM

## 2021-06-03 DIAGNOSIS — C787 Secondary malignant neoplasm of liver and intrahepatic bile duct: Secondary | ICD-10-CM | POA: Diagnosis not present

## 2021-06-03 DIAGNOSIS — C50211 Malignant neoplasm of upper-inner quadrant of right female breast: Secondary | ICD-10-CM | POA: Diagnosis not present

## 2021-06-03 DIAGNOSIS — R5383 Other fatigue: Secondary | ICD-10-CM | POA: Diagnosis not present

## 2021-06-03 DIAGNOSIS — D6481 Anemia due to antineoplastic chemotherapy: Secondary | ICD-10-CM | POA: Diagnosis not present

## 2021-06-03 DIAGNOSIS — G629 Polyneuropathy, unspecified: Secondary | ICD-10-CM | POA: Diagnosis not present

## 2021-06-03 DIAGNOSIS — Z5111 Encounter for antineoplastic chemotherapy: Secondary | ICD-10-CM | POA: Diagnosis not present

## 2021-06-03 DIAGNOSIS — T451X5A Adverse effect of antineoplastic and immunosuppressive drugs, initial encounter: Secondary | ICD-10-CM | POA: Diagnosis not present

## 2021-06-03 DIAGNOSIS — C7951 Secondary malignant neoplasm of bone: Secondary | ICD-10-CM | POA: Diagnosis not present

## 2021-06-03 DIAGNOSIS — C50112 Malignant neoplasm of central portion of left female breast: Secondary | ICD-10-CM

## 2021-06-03 DIAGNOSIS — Z95828 Presence of other vascular implants and grafts: Secondary | ICD-10-CM

## 2021-06-03 LAB — COMPREHENSIVE METABOLIC PANEL
ALT: 23 U/L (ref 0–44)
AST: 21 U/L (ref 15–41)
Albumin: 3.6 g/dL (ref 3.5–5.0)
Alkaline Phosphatase: 134 U/L — ABNORMAL HIGH (ref 38–126)
Anion gap: 7 (ref 5–15)
BUN: 14 mg/dL (ref 6–20)
CO2: 30 mmol/L (ref 22–32)
Calcium: 9.3 mg/dL (ref 8.9–10.3)
Chloride: 104 mmol/L (ref 98–111)
Creatinine, Ser: 0.82 mg/dL (ref 0.44–1.00)
GFR, Estimated: >60 mL/min (ref >60)
Glucose, Bld: 94 mg/dL (ref 70–99)
Potassium: 3.8 mmol/L (ref 3.5–5.1)
Sodium: 141 mmol/L (ref 135–145)
Total Bilirubin: 0.3 mg/dL (ref 0.3–1.2)
Total Protein: 6.6 g/dL (ref 6.5–8.1)

## 2021-06-03 LAB — CBC WITH DIFFERENTIAL/PLATELET
Abs Immature Granulocytes: 0.02 10*3/uL (ref 0.00–0.07)
Basophils Absolute: 0 10*3/uL (ref 0.0–0.1)
Basophils Relative: 1 %
Eosinophils Absolute: 0.1 10*3/uL (ref 0.0–0.5)
Eosinophils Relative: 3 %
HCT: 32.8 % — ABNORMAL LOW (ref 36.0–46.0)
Hemoglobin: 10.3 g/dL — ABNORMAL LOW (ref 12.0–15.0)
Immature Granulocytes: 0 %
Lymphocytes Relative: 15 %
Lymphs Abs: 0.7 10*3/uL (ref 0.7–4.0)
MCH: 28.1 pg (ref 26.0–34.0)
MCHC: 31.4 g/dL (ref 30.0–36.0)
MCV: 89.6 fL (ref 80.0–100.0)
Monocytes Absolute: 0.4 10*3/uL (ref 0.1–1.0)
Monocytes Relative: 8 %
Neutro Abs: 3.4 10*3/uL (ref 1.7–7.7)
Neutrophils Relative %: 73 %
Platelets: 269 10*3/uL (ref 150–400)
RBC: 3.66 MIL/uL — ABNORMAL LOW (ref 3.87–5.11)
RDW: 19.2 % — ABNORMAL HIGH (ref 11.5–15.5)
WBC: 4.7 10*3/uL (ref 4.0–10.5)
nRBC: 0 % (ref 0.0–0.2)

## 2021-06-03 MED ORDER — SODIUM CHLORIDE 0.9 % IV SOLN
65.0000 mg/m2 | Freq: Once | INTRAVENOUS | Status: AC
  Administered 2021-06-03: 11:00:00 156 mg via INTRAVENOUS
  Filled 2021-06-03: qty 26

## 2021-06-03 MED ORDER — HEPARIN SOD (PORK) LOCK FLUSH 100 UNIT/ML IV SOLN
500.0000 [IU] | Freq: Once | INTRAVENOUS | Status: AC | PRN
  Administered 2021-06-03: 12:00:00 500 [IU]

## 2021-06-03 MED ORDER — SODIUM CHLORIDE 0.9% FLUSH
10.0000 mL | INTRAVENOUS | Status: DC | PRN
  Administered 2021-06-03: 12:00:00 10 mL

## 2021-06-03 MED ORDER — FAMOTIDINE 20 MG IN NS 100 ML IVPB
20.0000 mg | Freq: Once | INTRAVENOUS | Status: AC
  Administered 2021-06-03: 10:00:00 20 mg via INTRAVENOUS
  Filled 2021-06-03: qty 100

## 2021-06-03 MED ORDER — SODIUM CHLORIDE 0.9% FLUSH
10.0000 mL | INTRAVENOUS | Status: AC | PRN
  Administered 2021-06-03: 08:00:00 10 mL

## 2021-06-03 MED ORDER — SODIUM CHLORIDE 0.9 % IV SOLN: Freq: Once | INTRAVENOUS | Status: AC

## 2021-06-03 MED ORDER — DIPHENHYDRAMINE HCL 50 MG/ML IJ SOLN
25.0000 mg | Freq: Once | INTRAMUSCULAR | Status: AC
  Administered 2021-06-03: 09:00:00 25 mg via INTRAVENOUS
  Filled 2021-06-03: qty 1

## 2021-06-03 MED ORDER — SODIUM CHLORIDE 0.9 % IV SOLN
10.0000 mg | Freq: Once | INTRAVENOUS | Status: AC
  Administered 2021-06-03: 09:00:00 10 mg via INTRAVENOUS
  Filled 2021-06-03: qty 10

## 2021-06-03 NOTE — Progress Notes (Signed)
Patient declined cryotherapy during taxol infusion

## 2021-06-04 LAB — CANCER ANTIGEN 27.29: CA 27.29: 341 U/mL — ABNORMAL HIGH (ref 0.0–38.6)

## 2021-06-05 ENCOUNTER — Other Ambulatory Visit (HOSPITAL_COMMUNITY): Payer: Self-pay

## 2021-06-05 MED FILL — Sertraline HCl Tab 100 MG: ORAL | 30 days supply | Qty: 30 | Fill #9 | Status: AC

## 2021-06-12 ENCOUNTER — Encounter: Payer: Self-pay | Admitting: Adult Health

## 2021-06-16 ENCOUNTER — Other Ambulatory Visit (HOSPITAL_COMMUNITY): Payer: Self-pay

## 2021-06-17 ENCOUNTER — Other Ambulatory Visit (HOSPITAL_COMMUNITY): Payer: Self-pay

## 2021-06-17 MED FILL — Dexamethasone Sodium Phosphate Inj 100 MG/10ML: INTRAMUSCULAR | Qty: 1 | Status: AC

## 2021-06-18 ENCOUNTER — Other Ambulatory Visit: Payer: Self-pay

## 2021-06-18 ENCOUNTER — Inpatient Hospital Stay: Payer: 59 | Attending: Oncology

## 2021-06-18 ENCOUNTER — Other Ambulatory Visit (HOSPITAL_COMMUNITY): Payer: Self-pay

## 2021-06-18 ENCOUNTER — Inpatient Hospital Stay (HOSPITAL_BASED_OUTPATIENT_CLINIC_OR_DEPARTMENT_OTHER): Payer: 59

## 2021-06-18 VITALS — BP 163/76 | HR 83 | Temp 98.1°F | Resp 18 | Wt 293.5 lb

## 2021-06-18 DIAGNOSIS — Z9013 Acquired absence of bilateral breasts and nipples: Secondary | ICD-10-CM | POA: Diagnosis not present

## 2021-06-18 DIAGNOSIS — Z79899 Other long term (current) drug therapy: Secondary | ICD-10-CM | POA: Insufficient documentation

## 2021-06-18 DIAGNOSIS — C50211 Malignant neoplasm of upper-inner quadrant of right female breast: Secondary | ICD-10-CM

## 2021-06-18 DIAGNOSIS — Z923 Personal history of irradiation: Secondary | ICD-10-CM | POA: Insufficient documentation

## 2021-06-18 DIAGNOSIS — I890 Lymphedema, not elsewhere classified: Secondary | ICD-10-CM | POA: Diagnosis not present

## 2021-06-18 DIAGNOSIS — Z5111 Encounter for antineoplastic chemotherapy: Secondary | ICD-10-CM | POA: Insufficient documentation

## 2021-06-18 DIAGNOSIS — C787 Secondary malignant neoplasm of liver and intrahepatic bile duct: Secondary | ICD-10-CM

## 2021-06-18 DIAGNOSIS — C7951 Secondary malignant neoplasm of bone: Secondary | ICD-10-CM | POA: Insufficient documentation

## 2021-06-18 DIAGNOSIS — Z95828 Presence of other vascular implants and grafts: Secondary | ICD-10-CM

## 2021-06-18 DIAGNOSIS — D6481 Anemia due to antineoplastic chemotherapy: Secondary | ICD-10-CM | POA: Diagnosis not present

## 2021-06-18 DIAGNOSIS — Z170 Estrogen receptor positive status [ER+]: Secondary | ICD-10-CM | POA: Diagnosis not present

## 2021-06-18 DIAGNOSIS — T451X5A Adverse effect of antineoplastic and immunosuppressive drugs, initial encounter: Secondary | ICD-10-CM | POA: Diagnosis not present

## 2021-06-18 DIAGNOSIS — C50112 Malignant neoplasm of central portion of left female breast: Secondary | ICD-10-CM

## 2021-06-18 LAB — CBC WITH DIFFERENTIAL/PLATELET
Abs Immature Granulocytes: 0.04 10*3/uL (ref 0.00–0.07)
Basophils Absolute: 0.1 10*3/uL (ref 0.0–0.1)
Basophils Relative: 1 %
Eosinophils Absolute: 0.1 10*3/uL (ref 0.0–0.5)
Eosinophils Relative: 3 %
HCT: 33 % — ABNORMAL LOW (ref 36.0–46.0)
Hemoglobin: 10.6 g/dL — ABNORMAL LOW (ref 12.0–15.0)
Immature Granulocytes: 1 %
Lymphocytes Relative: 11 %
Lymphs Abs: 0.6 10*3/uL — ABNORMAL LOW (ref 0.7–4.0)
MCH: 28.6 pg (ref 26.0–34.0)
MCHC: 32.1 g/dL (ref 30.0–36.0)
MCV: 88.9 fL (ref 80.0–100.0)
Monocytes Absolute: 0.5 10*3/uL (ref 0.1–1.0)
Monocytes Relative: 10 %
Neutro Abs: 4.2 10*3/uL (ref 1.7–7.7)
Neutrophils Relative %: 74 %
Platelets: 239 10*3/uL (ref 150–400)
RBC: 3.71 MIL/uL — ABNORMAL LOW (ref 3.87–5.11)
RDW: 19.1 % — ABNORMAL HIGH (ref 11.5–15.5)
WBC: 5.6 10*3/uL (ref 4.0–10.5)
nRBC: 0 % (ref 0.0–0.2)

## 2021-06-18 LAB — COMPREHENSIVE METABOLIC PANEL
ALT: 36 U/L (ref 0–44)
AST: 34 U/L (ref 15–41)
Albumin: 3.6 g/dL (ref 3.5–5.0)
Alkaline Phosphatase: 143 U/L — ABNORMAL HIGH (ref 38–126)
Anion gap: 6 (ref 5–15)
BUN: 12 mg/dL (ref 6–20)
CO2: 32 mmol/L (ref 22–32)
Calcium: 8.9 mg/dL (ref 8.9–10.3)
Chloride: 103 mmol/L (ref 98–111)
Creatinine, Ser: 0.8 mg/dL (ref 0.44–1.00)
GFR, Estimated: >60 mL/min (ref >60)
Glucose, Bld: 99 mg/dL (ref 70–99)
Potassium: 3.6 mmol/L (ref 3.5–5.1)
Sodium: 141 mmol/L (ref 135–145)
Total Bilirubin: 0.4 mg/dL (ref 0.3–1.2)
Total Protein: 6.8 g/dL (ref 6.5–8.1)

## 2021-06-18 MED ORDER — SODIUM CHLORIDE 0.9% FLUSH
10.0000 mL | INTRAVENOUS | Status: AC | PRN
  Administered 2021-06-18: 10 mL

## 2021-06-18 MED ORDER — SODIUM CHLORIDE 0.9 % IV SOLN
65.0000 mg/m2 | Freq: Once | INTRAVENOUS | Status: AC
  Administered 2021-06-18: 156 mg via INTRAVENOUS
  Filled 2021-06-18: qty 26

## 2021-06-18 MED ORDER — DIPHENHYDRAMINE HCL 50 MG/ML IJ SOLN
25.0000 mg | Freq: Once | INTRAMUSCULAR | Status: AC
  Administered 2021-06-18: 25 mg via INTRAVENOUS
  Filled 2021-06-18: qty 1

## 2021-06-18 MED ORDER — FAMOTIDINE IN NACL 20-0.9 MG/50ML-% IV SOLN
20.0000 mg | Freq: Once | INTRAVENOUS | Status: AC
  Administered 2021-06-18: 20 mg via INTRAVENOUS
  Filled 2021-06-18: qty 50

## 2021-06-18 MED ORDER — DENOSUMAB 120 MG/1.7ML ~~LOC~~ SOLN
120.0000 mg | Freq: Once | SUBCUTANEOUS | Status: AC
  Administered 2021-06-18: 120 mg via SUBCUTANEOUS
  Filled 2021-06-18: qty 1.7

## 2021-06-18 MED ORDER — SODIUM CHLORIDE 0.9 % IV SOLN
10.0000 mg | Freq: Once | INTRAVENOUS | Status: AC
  Administered 2021-06-18: 10 mg via INTRAVENOUS
  Filled 2021-06-18: qty 10

## 2021-06-18 MED ORDER — SODIUM CHLORIDE 0.9 % IV SOLN: Freq: Once | INTRAVENOUS | Status: AC

## 2021-06-18 MED ORDER — SODIUM CHLORIDE 0.9% FLUSH
10.0000 mL | INTRAVENOUS | Status: DC | PRN
  Administered 2021-06-18: 10 mL

## 2021-06-18 MED ORDER — HEPARIN SOD (PORK) LOCK FLUSH 100 UNIT/ML IV SOLN
500.0000 [IU] | Freq: Once | INTRAVENOUS | Status: AC | PRN
  Administered 2021-06-18: 500 [IU]

## 2021-06-18 NOTE — Patient Instructions (Signed)
[facility] ONCOLOG[facility]   Discharge Instructions: Thank you for choosing [facility] to provide your oncology and hematology care.   If you have a lab appointment with the [facility], please go directly to the [facility] and check in at the registration area.   Wear comfortable clothing and clothing appropriate for easy access to any Portacath or PICC line.   We strive to give you quality time with your provider. You may need to reschedule your appointment if you arrive late (15 or more minutes).  Arriving late affects you and other patients whose appointments are after yours.  Also, if you miss three or more appointments without notifying the office, you may be dismissed from the clinic at the providers discretion.      For prescription refill requests, have your pharmacy contact our office and allow 72 hours for refills to be completed.    Today you received the following chemotherapy and/or immunotherapy agents: Paclitaxel (Taxol)      To help prevent nausea and vomiting after your treatment, we encourage you to take your nausea medication as directed.  BELOW ARE SYMPTOMS THAT SHOULD BE REPORTED IMMEDIATELY: *FEVER GREATER THAN 100.4 F (38 C) OR HIGHER *CHILLS OR SWEATING *NAUSEA AND VOMITING THAT IS NOT CONTROLLED WITH YOUR NAUSEA MEDICATION *UNUSUAL SHORTNESS OF BREATH *UNUSUAL BRUISING OR BLEEDING *URINARY PROBLEMS (pain or burning when urinating, or frequent urination) *BOWEL PROBLEMS (unusual diarrhea, constipation, pain near the anus) TENDERNESS IN MOUTH AND THROAT WITH OR WITHOUT PRESENCE OF ULCERS (sore throat, sores in mouth, or a toothache) UNUSUAL RASH, SWELLING OR PAIN  UNUSUAL VAGINAL DISCHARGE OR ITCHING   Items with * indicate a potential emergency and should be followed up as soon as possible or go to the Emergency Department if any problems should occur.  Please show the CHEMOTHERAPY ALERT CARD or IMMUNOTHERAPY ALERT CARD at  check-in to the Emergency Department and triage nurse.  Should you have questions after your visit or need to cancel or reschedule your appointment, please contact [facility]  Dept: [phone]  and follow the prompts.  Office hours are 8:00 a.m. to 4:30 p.m. Monday - Friday. Please note that voicemails left after 4:00 p.m. may not be returned until the following business day.  We are closed weekends and major holidays. You have access to a nurse at all times for urgent questions. Please call the main number to the clinic Dept: [phone] and follow the prompts.   For any non-urgent questions, you may also contact your provider using MyChart. We now offer e-Visits for anyone 66 and older to request care online for non-urgent symptoms. For details visit [email or web address].   Also download the MyChart app! Go to the app store, search "MyChart", open the app, select [facility], and log in with your MyChart username and password.  Due to Covid, a mask is required upon entering the hospital/clinic. If you do not have a mask, one will be given to you upon arrival. For doctor visits, patients may have 1 support person aged 64 or older with them. For treatment visits, patients cannot have anyone with them due to current Covid guidelines and our immunocompromised population.

## 2021-06-18 NOTE — Progress Notes (Signed)
Patient declined cryoprevention for treatment today.

## 2021-06-19 LAB — CANCER ANTIGEN 27.29: CA 27.29: 379.4 U/mL — ABNORMAL HIGH (ref 0.0–38.6)

## 2021-06-24 ENCOUNTER — Encounter (HOSPITAL_COMMUNITY): Payer: Self-pay

## 2021-06-24 ENCOUNTER — Ambulatory Visit (HOSPITAL_COMMUNITY)
Admission: RE | Admit: 2021-06-24 | Discharge: 2021-06-24 | Disposition: A | Discharge status: Home / Self Care | Payer: 59 | Source: Ambulatory Visit | Attending: Hematology and Oncology | Admitting: Hematology and Oncology

## 2021-06-24 ENCOUNTER — Other Ambulatory Visit: Payer: Self-pay

## 2021-06-24 DIAGNOSIS — C7951 Secondary malignant neoplasm of bone: Secondary | ICD-10-CM | POA: Diagnosis not present

## 2021-06-24 DIAGNOSIS — C50211 Malignant neoplasm of upper-inner quadrant of right female breast: Secondary | ICD-10-CM | POA: Insufficient documentation

## 2021-06-24 DIAGNOSIS — C50919 Malignant neoplasm of unspecified site of unspecified female breast: Secondary | ICD-10-CM | POA: Diagnosis not present

## 2021-06-24 DIAGNOSIS — C787 Secondary malignant neoplasm of liver and intrahepatic bile duct: Secondary | ICD-10-CM | POA: Diagnosis not present

## 2021-06-24 DIAGNOSIS — E7800 Pure hypercholesterolemia, unspecified: Secondary | ICD-10-CM | POA: Diagnosis not present

## 2021-06-24 DIAGNOSIS — I10 Essential (primary) hypertension: Secondary | ICD-10-CM | POA: Diagnosis not present

## 2021-06-24 DIAGNOSIS — Z0000 Encounter for general adult medical examination without abnormal findings: Secondary | ICD-10-CM | POA: Diagnosis not present

## 2021-06-24 DIAGNOSIS — F39 Unspecified mood [affective] disorder: Secondary | ICD-10-CM | POA: Diagnosis not present

## 2021-06-24 DIAGNOSIS — J941 Fibrothorax: Secondary | ICD-10-CM | POA: Diagnosis not present

## 2021-06-24 DIAGNOSIS — E039 Hypothyroidism, unspecified: Secondary | ICD-10-CM | POA: Diagnosis not present

## 2021-06-24 DIAGNOSIS — J8410 Pulmonary fibrosis, unspecified: Secondary | ICD-10-CM | POA: Diagnosis not present

## 2021-06-24 DIAGNOSIS — K769 Liver disease, unspecified: Secondary | ICD-10-CM | POA: Diagnosis not present

## 2021-06-24 DIAGNOSIS — Z170 Estrogen receptor positive status [ER+]: Secondary | ICD-10-CM | POA: Insufficient documentation

## 2021-06-24 DIAGNOSIS — D0510 Intraductal carcinoma in situ of unspecified breast: Secondary | ICD-10-CM | POA: Diagnosis not present

## 2021-06-24 DIAGNOSIS — E0500 Thyrotoxicosis with diffuse goiter without thyrotoxic crisis or storm: Secondary | ICD-10-CM | POA: Diagnosis not present

## 2021-06-24 MED ORDER — HEPARIN SOD (PORK) LOCK FLUSH 100 UNIT/ML IV SOLN
INTRAVENOUS | Status: AC
  Administered 2021-06-24: 500 [IU] via INTRAVENOUS
  Filled 2021-06-24: qty 5

## 2021-06-24 MED ORDER — SODIUM CHLORIDE (PF) 0.9 % IJ SOLN
INTRAMUSCULAR | Status: AC
  Filled 2021-06-24: qty 50

## 2021-06-24 MED ORDER — HEPARIN SOD (PORK) LOCK FLUSH 100 UNIT/ML IV SOLN: 500.0000 [IU] | Freq: Once | INTRAVENOUS | Status: AC

## 2021-06-24 MED ORDER — IOHEXOL 300 MG/ML  SOLN
100.0000 mL | Freq: Once | INTRAMUSCULAR | Status: AC | PRN
  Administered 2021-06-24: 100 mL via INTRAVENOUS

## 2021-06-30 MED FILL — Dexamethasone Sodium Phosphate Inj 100 MG/10ML: INTRAMUSCULAR | Qty: 1 | Status: AC

## 2021-06-30 NOTE — Progress Notes (Signed)
Patient Care Team: Kelton Pillar, MD as PCP - General (Family Medicine) Jacelyn Pi, MD as Consulting Physician (Endocrinology) Jovita Kussmaul, MD as Consulting Physician (General Surgery) [name], MD as Attending Physician (Radiation Oncology) Belva Crome, MD as Consulting Physician (Cardiology) [name], [name], NP as Nurse Practitioner (Hematology and Oncology) Lajoyce Lauber, DMD (Dentistry) Raina Mina, RPH-CPP (Pharmacist) Nicholas Lose, MD as Consulting Physician (Hematology and Oncology)  DIAGNOSIS:    ICD-10-CM   1. Malignant neoplasm of upper-inner quadrant of right breast in female, estrogen receptor positive ([place])  C50.211    Z17.0       SUMMARY OF ONCOLOGIC HISTORY: Oncology History  Malignant neoplasm of upper-inner quadrant of right breast in female, estrogen receptor positive ([place])  10/15/2015 Initial Biopsy   Right breast upper inner quadrant biopsy: IDC, DCIS, grade 2, ER+(90%), PR+(50%), Ki67 70%, HER-2 negative (ratio 1.18).   10/17/2015 Initial Diagnosis   Malignant neoplasm of upper-inner quadrant of right breast in female, estrogen receptor positive ([place])   10/2015 -  Anti-estrogen oral therapy   Tamoxifen daily   11/28/2015 Surgery   Left breast mammoplasty: IDC, grade 1, 1.2cm, DCIS, lobular neoplasia, T1c, Nx, ER 80%, PR60%, Ki67 3% HER-2 neg Right breast lumpectomy: IDC, grade 1, 2.3cm, DCIS. T2N1a ER/PR+HER-2 neg, Ki 70%.   11/28/2015 Miscellaneous   Mammaprint was Low Risk. Predicting a 98% chance of no disease recurrence within 5 years with anti-estrogens as the only systemic therapy.  It also predicts minimal to no benefit from chemotherapy.     12/26/2015 Surgery   Bilateral mastectomies: bilateral sentinel node sampling, Right: no residual carcinoma, left, focal DCIS, 4 SLN negative   02/11/2016 - 03/27/2016 Radiation Therapy   Adjuvant radiation [name]): 1) Right chest wall: 50.4 Gy in 28 fractions  2) Right chest  wall boost: 10 Gy in 5 fractions   08/04/2017 Relapse/Recurrence   08/04/2017, MRI of the liver and total spine MRI 08/11/2017 showed liver and bone metastasis; liver biopsy 09/07/2017 confirms estrogen receptor positive, progesterone receptor negative, HER-2 negative metastatic breast cancer; chest CT scan 08/13/2017 shows multiple bilateral pulmonary nodules.   08/17/2017 - 01/04/2018 Anti-estrogen oral therapy   fulvestrant  palbociclib started 08/31/2017 discontinued 01/04/2018 with evidence of progression in the liver   08/2017 Miscellaneous   [facility] report on liver biopsy from April 2019 shows a negative PDL 1, stable MSI and proficient mismatch repair status.  The tumor is positive for the androgen receptor, and for PTEN for PIK3 CA mutations.  HER-2/neu was read as equivocal (it was negative by FISH on the pathology report).   01/04/2018 Miscellaneous   anastrozole started 01/04/2018 everolimus started 01/29/2018   02/16/2018 - 03/01/2018 Radiation Therapy   01/28/18 prophylactic left femur intramedullary nail surgery for impending left femur pathologic fracture alliative radiation with Dr. [name] to the left femur and left ilium from 02/16/2018 to 03/01/2018   01/30/2020 - 05/2020 Anti-estrogen oral therapy   alpelisib started 01/30/2020 at 300 mg daily with Metformin 500 mg daily and Faslodex (stopped for progression)   05/22/2020 - 08/2020 Chemotherapy   Xeloda discontinued for progression   09/10/2020 -  Chemotherapy   Patient is on Treatment Plan : BREAST Paclitaxel D1,D15 q28d       CHIEF COMPLIANT: Follow-up of metastatic breast cancer on Taxol  INTERVAL HISTORY: Debbie [name] is a 56 y.o. with above-mentioned history of metastatic breast cancer has been on multiple lines of treatment and is currently receiving Taxol treatments. She presents  to the clinic today for treatment.  Patient starting to have symptoms of peripheral neuropathy especially in the fingers as well as in the  balls of the toes.  Denies any other Taxol side effects.  She had CT scans and is here to discuss results.  ALLERGIES:  is allergic to ace inhibitors, atorvastatin, simvastatin, and percocet [oxycodone-acetaminophen].  MEDICATIONS:  Current Outpatient Medications  Medication Sig Dispense Refill   acetaminophen (TYLENOL) 500 MG tablet Take 1,000 mg by mouth every 6 (six) hours as needed.     Calcium-Magnesium 250-125 MG TABS Take 1 tablet by mouth daily. 120 each 4   cetirizine (ZYRTEC) 10 MG tablet Take 10 mg by mouth daily as needed for allergies.      chlorhexidine (PERIDEX) 0.12 % solution Use 5 mLs in the mouth or throat 2 (two) times daily as needed as directed. Do not swallow 473 mL 6   doxycycline (VIBRA-TABS) 100 MG tablet Take 1 tablet (100 mg total) by mouth daily. 30 tablet 2   furosemide (LASIX) 20 MG tablet Take 1 tablet (20 mg total) by mouth daily. 90 tablet 6   levothyroxine (SYNTHROID) 175 MCG tablet Take 1 tablet (175 mcg total) by mouth in the morning on an empty stomach 30 tablet 6   lidocaine-prilocaine (EMLA) cream Apply to affected area once 30 g 3   losartan-hydrochlorothiazide (HYZAAR) 100-25 MG tablet Take 1 tablet by mouth once a day 90 tablet 2   Multiple Vitamin (MULTIVITAMIN) tablet Take 1 tablet by mouth daily.     omega-3 acid ethyl esters (LOVAZA) 1 g capsule Take 1 g by mouth 2 (two) times daily.      omeprazole (PRILOSEC) 40 MG capsule Take 1 capsule (40 mg total) by mouth daily. 30 capsule 3   oxyCODONE (OXY IR/ROXICODONE) 5 MG immediate release tablet Take 1 tablet (5 mg total) by mouth every 4 (four) hours as needed for severe pain. 60 tablet 0   potassium chloride SA (KLOR-CON) 20 MEQ tablet Take 2 tablets (40 mEq total) by mouth daily. 90 tablet 4   pregabalin (LYRICA) 100 MG capsule Take 1 capsule (100 mg total) by mouth 3 (three) times daily. 90 capsule 1   prochlorperazine (COMPAZINE) 10 MG tablet Take 1 tablet (10 mg total) by mouth every 6 (six)  hours as needed (Nausea or vomiting). 30 tablet 1   rosuvastatin (CRESTOR) 5 MG tablet Take 1 tablet (5 mg total) by mouth daily. 90 tablet 1   sertraline (ZOLOFT) 100 MG tablet TAKE 1 TABLET BY MOUTH ONCE A DAY 30 tablet 12   valACYclovir (VALTREX) 1000 MG tablet Take 1 tablet (1,000 mg total) by mouth daily. 60 tablet 6   Vitamin D, Cholecalciferol, 1000 units CAPS Take 1 tablet by mouth daily. (Patient taking differently: Take 1,000 Units by mouth daily.) 90 capsule 4   No current facility-administered medications for this visit.    PHYSICAL EXAMINATION: ECOG PERFORMANCE STATUS: 1 - Symptomatic but completely ambulatory  Vitals:   07/01/21 1008  BP: (!) 151/62  Pulse: (!) 103  Resp: 18  Temp: (!) 97.5 F (36.4 C)  SpO2: 99%   Filed Weights   07/01/21 1008  Weight: 295 lb 3.2 oz (133.9 kg)    LABORATORY DATA:  I have reviewed the data as listed CMP Latest Ref Rng & Units 06/18/2021 06/03/2021 05/21/2021  Glucose 70 - 99 mg/dL 99 94 94  BUN 6 - 20 mg/dL _0 Creatinine 0.44 - 1.00 mg/dL 0.80  0.82 0.77  Sodium 135 - 145 mmol/L 141 141 141  Potassium 3.5 - 5.1 mmol/L 3.6 3.8 3.2(L)  Chloride 98 - 111 mmol/L 103 104 103  CO2 22 - 32 mmol/L 32 30 32  Calcium 8.9 - 10.3 mg/dL 8.9 9.3 8.9  Total Protein 6.5 - 8.1 g/dL 6.8 6.6 6.7  Total Bilirubin 0.3 - 1.2 mg/dL 0.4 0.3 0.3  Alkaline Phos 38 - 126 U/L 143(H) 134(H) 121  AST 15 - 41 U/L 34 21 22  ALT 0 - 44 U/L 36 23 22    Lab Results  Component Value Date   WBC 5.3 07/01/2021   HGB 11.0 (L) 07/01/2021   HCT 34.2 (L) 07/01/2021   MCV 88.4 07/01/2021   PLT 264 07/01/2021   NEUTROABS 4.0 07/01/2021    ASSESSMENT & PLAN:  Malignant neoplasm of upper-inner quadrant of right breast in female, estrogen receptor positive ([place]) Metastatic Breast Cancer: ER Pos and Her 2 Neg (previously progressed on Ibrance, fulvestrant, everolimus, alpelisib, Xeloda) Current Treatment: Taxol since 09/10/2020   Toxicities: Mild peripheral  neuropathy: Stable Chemotherapy-induced anemia: Stable hemoglobin today 10.4 Mild fatigue   Chronic right arm lymphedema   Surveillance: CT CAP 06/24/2021: Stable mediastinal and hilar lymphadenopathy, stable to mild increase in the multifocal liver metastases, similar appearance of diffuse mixed lytic and sclerotic bone metastases, multiple small lung nodules stable to mildly increased in the interval  Based on gradually worsening tumor markers and a slight progression on the scans and also starting symptoms of neuropathy, I recommended that we switch her treatment.  Therefore we will discontinue Taxol.  Plan: We will obtain IHC on the prior biopsy material for her2 to determine if she would benefit from Enhertu Other options would be Sacituzumab-Govitecan  We will call her with the result of the IHC for HER2 and decide if Enhertu may be an option for her.      No orders of the defined types were placed in this encounter.  The patient has a good understanding of the overall plan. she agrees with it. she will call with any problems that may develop before the next visit here.  Total time spent: 30 mins including face to face time and time spent for planning, charting and coordination of care  Rulon Eisenmenger, MD, MPH 07/01/2021  I, Thana Ates, am acting as scribe for Dr. Nicholas Lose.  I have reviewed the above documentation for accuracy and completeness, and I agree with the above.

## 2021-07-01 ENCOUNTER — Encounter: Payer: Self-pay | Admitting: *Deleted

## 2021-07-01 ENCOUNTER — Inpatient Hospital Stay: Payer: 59

## 2021-07-01 ENCOUNTER — Inpatient Hospital Stay: Payer: 59 | Admitting: Hematology and Oncology

## 2021-07-01 ENCOUNTER — Other Ambulatory Visit: Payer: Self-pay

## 2021-07-01 DIAGNOSIS — C7951 Secondary malignant neoplasm of bone: Secondary | ICD-10-CM | POA: Diagnosis not present

## 2021-07-01 DIAGNOSIS — Z5111 Encounter for antineoplastic chemotherapy: Secondary | ICD-10-CM | POA: Diagnosis not present

## 2021-07-01 DIAGNOSIS — C50211 Malignant neoplasm of upper-inner quadrant of right female breast: Secondary | ICD-10-CM

## 2021-07-01 DIAGNOSIS — T451X5A Adverse effect of antineoplastic and immunosuppressive drugs, initial encounter: Secondary | ICD-10-CM | POA: Diagnosis not present

## 2021-07-01 DIAGNOSIS — Z95828 Presence of other vascular implants and grafts: Secondary | ICD-10-CM

## 2021-07-01 DIAGNOSIS — C787 Secondary malignant neoplasm of liver and intrahepatic bile duct: Secondary | ICD-10-CM | POA: Diagnosis not present

## 2021-07-01 DIAGNOSIS — D6481 Anemia due to antineoplastic chemotherapy: Secondary | ICD-10-CM | POA: Diagnosis not present

## 2021-07-01 DIAGNOSIS — C50919 Malignant neoplasm of unspecified site of unspecified female breast: Secondary | ICD-10-CM | POA: Diagnosis not present

## 2021-07-01 DIAGNOSIS — I890 Lymphedema, not elsewhere classified: Secondary | ICD-10-CM | POA: Diagnosis not present

## 2021-07-01 DIAGNOSIS — Z9013 Acquired absence of bilateral breasts and nipples: Secondary | ICD-10-CM | POA: Diagnosis not present

## 2021-07-01 DIAGNOSIS — Z170 Estrogen receptor positive status [ER+]: Secondary | ICD-10-CM | POA: Diagnosis not present

## 2021-07-01 LAB — COMPREHENSIVE METABOLIC PANEL
ALT: 34 U/L (ref 0–44)
AST: 31 U/L (ref 15–41)
Albumin: 3.7 g/dL (ref 3.5–5.0)
Alkaline Phosphatase: 154 U/L — ABNORMAL HIGH (ref 38–126)
Anion gap: 5 (ref 5–15)
BUN: 12 mg/dL (ref 6–20)
CO2: 33 mmol/L — ABNORMAL HIGH (ref 22–32)
Calcium: 9.4 mg/dL (ref 8.9–10.3)
Chloride: 102 mmol/L (ref 98–111)
Creatinine, Ser: 0.76 mg/dL (ref 0.44–1.00)
GFR, Estimated: >60 mL/min (ref >60)
Glucose, Bld: 96 mg/dL (ref 70–99)
Potassium: 3.9 mmol/L (ref 3.5–5.1)
Sodium: 140 mmol/L (ref 135–145)
Total Bilirubin: 0.4 mg/dL (ref 0.3–1.2)
Total Protein: 6.8 g/dL (ref 6.5–8.1)

## 2021-07-01 LAB — CBC WITH DIFFERENTIAL/PLATELET
Abs Immature Granulocytes: 0.02 10*3/uL (ref 0.00–0.07)
Basophils Absolute: 0 10*3/uL (ref 0.0–0.1)
Basophils Relative: 1 %
Eosinophils Absolute: 0.2 10*3/uL (ref 0.0–0.5)
Eosinophils Relative: 3 %
HCT: 34.2 % — ABNORMAL LOW (ref 36.0–46.0)
Hemoglobin: 11 g/dL — ABNORMAL LOW (ref 12.0–15.0)
Immature Granulocytes: 0 %
Lymphocytes Relative: 11 %
Lymphs Abs: 0.6 10*3/uL — ABNORMAL LOW (ref 0.7–4.0)
MCH: 28.4 pg (ref 26.0–34.0)
MCHC: 32.2 g/dL (ref 30.0–36.0)
MCV: 88.4 fL (ref 80.0–100.0)
Monocytes Absolute: 0.5 10*3/uL (ref 0.1–1.0)
Monocytes Relative: 10 %
Neutro Abs: 4 10*3/uL (ref 1.7–7.7)
Neutrophils Relative %: 75 %
Platelets: 264 10*3/uL (ref 150–400)
RBC: 3.87 MIL/uL (ref 3.87–5.11)
RDW: 19.5 % — ABNORMAL HIGH (ref 11.5–15.5)
WBC: 5.3 10*3/uL (ref 4.0–10.5)
nRBC: 0 % (ref 0.0–0.2)

## 2021-07-01 MED ORDER — SODIUM CHLORIDE 0.9% FLUSH
10.0000 mL | INTRAVENOUS | Status: AC | PRN
  Administered 2021-07-01: 10 mL

## 2021-07-01 NOTE — Assessment & Plan Note (Signed)
Metastatic Breast Cancer: ER Pos and Her 2 Neg (previously progressed on Ibrance, fulvestrant, everolimus, alpelisib, Xeloda) Current Treatment: Taxol since 09/10/2020  Toxicities: 1. Mild peripheral neuropathy: Stable 2. Chemotherapy-induced anemia: Stable hemoglobin today 10.4 3. Mild fatigue  Chronic right arm lymphedema  Surveillance: CT CAP 06/24/2021: Stable mediastinal and hilar lymphadenopathy, stable to mild increase in the multifocal liver metastases, similar appearance of diffuse mixed lytic and sclerotic bone metastases, multiple small lung nodules stable to mildly increased in the interval  Plan: We will obtain IHC on the prior biopsy material for her2 to determine if she would benefit from Enhertu Other options would be Sacituzumab-Govitecan  Since the progression is very mild we elected to continue with the Taxol for the time being.

## 2021-07-01 NOTE — Progress Notes (Signed)
Per MD request RN placed call to [facility] with [facility] pathology and requested IHC HER 2 to be added to pathology report on 09/07/2017.  Suanne Marker verbalized understanding and states additional labs will be added.

## 2021-07-02 LAB — CANCER ANTIGEN 27.29: CA 27.29: 413.5 U/mL — ABNORMAL HIGH (ref 0.0–38.6)

## 2021-07-08 ENCOUNTER — Encounter: Payer: Self-pay | Admitting: *Deleted

## 2021-07-08 ENCOUNTER — Other Ambulatory Visit: Payer: Self-pay

## 2021-07-08 MED FILL — Sertraline HCl Tab 100 MG: ORAL | 30 days supply | Qty: 30 | Fill #10 | Status: AC

## 2021-07-08 NOTE — Progress Notes (Signed)
Received updated Pathology report from 09/07/2017 stating inadequate sample to preform IHC HER2 testing.  RN reviewed with MD who has requested IHC HER2 testing to be added to pathology from 11/28/2015.  RN placed call to Suanne Marker with [facility] pathology department who stated she would email [facility] pathology and request that Dr. Melina Copa preform additional IHC HER2 testing.

## 2021-07-09 ENCOUNTER — Other Ambulatory Visit (HOSPITAL_COMMUNITY): Payer: Self-pay

## 2021-07-09 ENCOUNTER — Encounter: Payer: Self-pay | Admitting: Hematology and Oncology

## 2021-07-09 ENCOUNTER — Other Ambulatory Visit: Payer: Self-pay | Admitting: Hematology and Oncology

## 2021-07-09 DIAGNOSIS — C50112 Malignant neoplasm of central portion of left female breast: Secondary | ICD-10-CM

## 2021-07-09 DIAGNOSIS — C50912 Malignant neoplasm of unspecified site of left female breast: Secondary | ICD-10-CM | POA: Diagnosis not present

## 2021-07-09 DIAGNOSIS — C773 Secondary and unspecified malignant neoplasm of axilla and upper limb lymph nodes: Secondary | ICD-10-CM | POA: Diagnosis not present

## 2021-07-09 DIAGNOSIS — C50911 Malignant neoplasm of unspecified site of right female breast: Secondary | ICD-10-CM | POA: Diagnosis not present

## 2021-07-09 DIAGNOSIS — C50211 Malignant neoplasm of upper-inner quadrant of right female breast: Secondary | ICD-10-CM

## 2021-07-09 DIAGNOSIS — C787 Secondary malignant neoplasm of liver and intrahepatic bile duct: Secondary | ICD-10-CM

## 2021-07-09 DIAGNOSIS — C50111 Malignant neoplasm of central portion of right female breast: Secondary | ICD-10-CM

## 2021-07-09 DIAGNOSIS — C7800 Secondary malignant neoplasm of unspecified lung: Secondary | ICD-10-CM

## 2021-07-09 DIAGNOSIS — Z170 Estrogen receptor positive status [ER+]: Secondary | ICD-10-CM

## 2021-07-09 NOTE — Progress Notes (Signed)
DISCONTINUE ON PATHWAY REGIMEN - Breast ? ? ?  A cycle is every 28 days (3 weeks on and 1 week off): ?    Paclitaxel  ? ?**Always confirm dose/schedule in your pharmacy ordering system** ? ?REASON: Disease Progression ?PRIOR TREATMENT: TDD220: Paclitaxel 80 mg/m2 D1, 8, 15 q28 Days ?TREATMENT RESPONSE: Unable to Evaluate ? ?START OFF PATHWAY REGIMEN - Breast ? ? ?[id number]:Sacituzumab govitecan 10 mg/kg IV D1,8 q21 Days: ?  A cycle is every 21 days: ?    Sacituzumab govitecan-hziy  ? ?**Always confirm dose/schedule in your pharmacy ordering system** ? ?Patient Characteristics: ?Distant Metastases or Locoregional Recurrent Disease - Unresected or Locally Advanced Unresectable Disease Progressing after Neoadjuvant and Local Therapies, HER2 Low/Negative/Unknown, ER Positive, Chemotherapy, HER2 Negative/Unknown, Third Line and  ?Beyond, Prior or Contraindicated Anthracycline and Prior Eribulin ?Therapeutic Status: Distant Metastases ?HER2 Status: Negative (-) ?ER Status: Positive (+) ?PR Status: Positive (+) ?Therapy Approach Indicated: Standard Chemotherapy/Endocrine Therapy ?Line of Therapy: Third Line and Beyond ?Intent of Therapy: ?Non-Curative / Palliative Intent, Discussed with Patient ?

## 2021-07-09 NOTE — Telephone Encounter (Signed)
I discussed with the patient that there was insufficient material to run immunohistochemistry for HER2 receptor.  The 2017 material was DCIS and therefore there is no benefit to running the HER2 on that specimen. ? ?Based on risks and benefits we decided not to pursue any additional biopsies at this time but to proceed with systemic treatment with [name]. ? ?Start chemo in 1 week ? ?

## 2021-07-10 ENCOUNTER — Other Ambulatory Visit (HOSPITAL_COMMUNITY): Payer: Self-pay

## 2021-07-10 ENCOUNTER — Encounter: Payer: Self-pay | Admitting: Adult Health

## 2021-07-10 NOTE — Progress Notes (Signed)
Pharmacist Chemotherapy Monitoring - Initial Assessment   ? ?Anticipated start date: 07/17/21  ? ?The following has been reviewed per standard work regarding the patient's treatment regimen: ?The patient's diagnosis, treatment plan and drug doses, and organ/hematologic function ?Lab orders and baseline tests specific to treatment regimen  ?The treatment plan start date, drug sequencing, and pre-medications ?Prior authorization status  ?Patient's documented medication list, including drug-drug interaction screen and prescriptions for anti-emetics and supportive care specific to the treatment regimen ?The drug concentrations, fluid compatibility, administration routes, and timing of the medications to be used ?The patient's access for treatment and lifetime cumulative dose history, if applicable  ?The patient's medication allergies and previous infusion related reactions, if applicable  ? ?Changes made to treatment plan:  ?N/A ? ?Follow up needed:  ?Pending authorization for treatment  ? ? ?Kennith Center, Pharm.D., CPP ?07/10/2021@4 :10 PM ? ? ? ?

## 2021-07-11 ENCOUNTER — Other Ambulatory Visit: Payer: Self-pay

## 2021-07-11 ENCOUNTER — Other Ambulatory Visit (HOSPITAL_COMMUNITY): Payer: Self-pay

## 2021-07-11 ENCOUNTER — Telehealth: Payer: Self-pay | Admitting: Hematology and Oncology

## 2021-07-11 MED ORDER — OMEPRAZOLE 40 MG PO CPDR
40.0000 mg | DELAYED_RELEASE_CAPSULE | Freq: Every day | ORAL | 3 refills | Status: DC
Start: 2021-07-11 — End: 2021-12-02
  Filled 2021-07-11: qty 30, 30d supply, fill #0
  Filled 2021-08-10: qty 30, 30d supply, fill #1
  Filled 2021-09-29: qty 30, 30d supply, fill #2
  Filled 2021-10-30: qty 30, 30d supply, fill #3

## 2021-07-11 NOTE — Telephone Encounter (Signed)
.  Called patient to schedule appointment per 3/1 inbasket, patient is aware of date and time.  ? ?

## 2021-07-15 ENCOUNTER — Inpatient Hospital Stay: Payer: 59

## 2021-07-15 NOTE — Progress Notes (Incomplete)
Patient Care Team: Kelton Pillar, MD as PCP - General (Family Medicine) Jacelyn Pi, MD as Consulting Physician (Endocrinology) Jovita Kussmaul, MD as Consulting Physician (General Surgery) [name], MD as Attending Physician (Radiation Oncology) Belva Crome, MD as Consulting Physician (Cardiology) [name] [name], NP as Nurse Practitioner (Hematology and Oncology) Lajoyce Lauber, DMD (Dentistry) Raina Mina, RPH-CPP (Pharmacist) Nicholas Lose, MD as Consulting Physician (Hematology and Oncology)  DIAGNOSIS: No diagnosis found.  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   10/17/2015 Initial Diagnosis   Malignant neoplasm of upper-inner quadrant of right breast in female, estrogen receptor positive ([place])   10/2015 -  Anti-estrogen oral therapy   Tamoxifen daily   11/28/2015 Surgery   Left breast mammoplasty: IDC, grade 1, 1.2cm, DCIS, lobular neoplasia, T1c, Nx, ER 80%, PR60%, Ki67 3% HER-2 neg Right breast lumpectomy: IDC, grade 1, 2.3cm, DCIS. T2N1a ER/PR+HER-2 neg, Ki 70%.   11/28/2015 Miscellaneous   Mammaprint was Low Risk. Predicting a 98% chance of no disease recurrence within 5 years with anti-estrogens as the only systemic therapy.  It also predicts minimal to no benefit from chemotherapy.     12/26/2015 Surgery   Bilateral mastectomies: bilateral sentinel node sampling, Right: no residual carcinoma, left, focal DCIS, 4 SLN negative   02/11/2016 - 03/27/2016 Radiation Therapy   Adjuvant radiation [name]): 1) Right chest wall: 50.4 Gy in 28 fractions  2) Right chest wall boost: 10 Gy in 5 fractions   08/04/2017 Relapse/Recurrence   08/04/2017, MRI of the liver and total spine MRI 08/11/2017  showed liver and bone metastasis; liver biopsy 09/07/2017 confirms estrogen receptor positive, progesterone receptor negative, HER-2 negative metastatic breast cancer; chest CT scan 08/13/2017 shows multiple bilateral pulmonary nodules.   08/17/2017 - 01/04/2018 Anti-estrogen oral therapy   fulvestrant  palbociclib started 08/31/2017 discontinued 01/04/2018 with evidence of progression in the liver   08/2017 Miscellaneous   [facility] report on liver biopsy from April 2019 shows a negative PDL 1, stable MSI and proficient mismatch repair status.  The tumor is positive for the androgen receptor, and for PTEN for PIK3 CA mutations.  HER-2/neu was read as equivocal (it was negative by FISH on the pathology report).   01/04/2018 Miscellaneous   anastrozole started 01/04/2018 everolimus started 01/29/2018   02/16/2018 - 03/01/2018 Radiation Therapy   01/28/18 prophylactic left femur intramedullary nail surgery for impending left femur pathologic fracture alliative radiation with Dr. [name] to the left femur and left ilium from 02/16/2018 to 03/01/2018   01/30/2020 - 05/2020 Anti-estrogen oral therapy   alpelisib started 01/30/2020 at 300 mg daily with Metformin 500 mg daily and Faslodex (stopped for progression)   05/22/2020 - 08/2020 Chemotherapy   Xeloda discontinued for progression   09/10/2020 - 06/18/2021 Chemotherapy   Patient is on Treatment Plan : BREAST Paclitaxel D1,D15 q28d     07/17/2021 -  Chemotherapy   Patient is on Treatment Plan : BREAST METASTATIC Sacituzumab govitecan-hziy Debbie [name]) q21d     Bilateral malignant neoplasm of breast in female, estrogen receptor positive ([name])  12/26/2015 Initial Diagnosis   Bilateral malignant neoplasm of breast in female, estrogen receptor positive ([name])   07/17/2021 -  Chemotherapy   Patient is on Treatment Plan : BREAST METASTATIC Sacituzumab govitecan-hziy Debbie [name]) q21d  Liver metastases ([name])  08/13/2017 Initial Diagnosis   Liver metastases ([name])    07/17/2021 -  Chemotherapy   Patient is on Treatment Plan : BREAST METASTATIC Sacituzumab govitecan-hziy Debbie [name]) q21d     Lung metastases ([name])  09/10/2020 Initial Diagnosis   Lung metastases ([name])   07/17/2021 -  Chemotherapy   Patient is on Treatment Plan : BREAST METASTATIC Sacituzumab govitecan-hziy Debbie [name]) q21d       CHIEF COMPLIANT: Follow-up of metastatic breast cancer on Trodelvy  INTERVAL HISTORY: Debbie [name] is a 56 y.o. with above-mentioned history of metastatic breast cancer has been on multiple lines of treatment and is currently receiving Trodelvy treatments. She presents to the clinic today to start treatment.   ALLERGIES:  is allergic to ace inhibitors, atorvastatin, simvastatin, and percocet [oxycodone-acetaminophen].  MEDICATIONS:  Current Outpatient Medications  Medication Sig Dispense Refill   acetaminophen (TYLENOL) 500 MG tablet Take 1,000 mg by mouth every 6 (six) hours as needed.     Calcium-Magnesium 250-125 MG TABS Take 1 tablet by mouth daily. 120 each 4   cetirizine (ZYRTEC) 10 MG tablet Take 10 mg by mouth daily as needed for allergies.      chlorhexidine (PERIDEX) 0.12 % solution Use 5 mLs in the mouth or throat 2 (two) times daily as needed as directed. Do not swallow 473 mL 6   doxycycline (VIBRA-TABS) 100 MG tablet Take 1 tablet (100 mg total) by mouth daily. 30 tablet 2   furosemide (LASIX) 20 MG tablet Take 1 tablet (20 mg total) by mouth daily. 90 tablet 6   levothyroxine (SYNTHROID) 175 MCG tablet Take 1 tablet (175 mcg total) by mouth in the morning on an empty stomach 30 tablet 6   losartan-hydrochlorothiazide (HYZAAR) 100-25 MG tablet Take 1 tablet by mouth once a day 90 tablet 2   Multiple Vitamin (MULTIVITAMIN) tablet Take 1 tablet by mouth daily.     omega-3 acid ethyl esters (LOVAZA) 1 g capsule Take 1 g by mouth 2 (two) times daily.      omeprazole (PRILOSEC) 40 MG capsule Take 1 capsule (40 mg total) by mouth daily. 30 capsule 3    oxyCODONE (OXY IR/ROXICODONE) 5 MG immediate release tablet Take 1 tablet (5 mg total) by mouth every 4 (four) hours as needed for severe pain. 60 tablet 0   potassium chloride SA (KLOR-CON) 20 MEQ tablet Take 2 tablets (40 mEq total) by mouth daily. 90 tablet 4   pregabalin (LYRICA) 100 MG capsule Take 1 capsule (100 mg total) by mouth 3 (three) times daily. 90 capsule 1   rosuvastatin (CRESTOR) 5 MG tablet Take 1 tablet (5 mg total) by mouth daily. 90 tablet 1   sertraline (ZOLOFT) 100 MG tablet TAKE 1 TABLET BY MOUTH ONCE A DAY 30 tablet 12   valACYclovir (VALTREX) 1000 MG tablet Take 1 tablet (1,000 mg total) by mouth daily. 60 tablet 6   Vitamin D, Cholecalciferol, 1000 units CAPS Take 1 tablet by mouth daily. (Patient taking differently: Take 1,000 Units by mouth daily.) 90 capsule 4   No current facility-administered medications for this visit.    PHYSICAL EXAMINATION: ECOG PERFORMANCE STATUS: {CHL ONC ECOG [phone]}  There were no vitals filed for this visit. There were no vitals filed for this visit.  BREAST:*** No palpable masses or nodules in either right or left breasts. No palpable axillary supraclavicular or infraclavicular adenopathy no breast tenderness or nipple discharge. (exam performed in the presence of a chaperone)  LABORATORY DATA:  I have reviewed the data as listed CMP Latest Ref Rng & Units 07/01/2021 06/18/2021 06/03/2021  Glucose 70 - 99 mg/dL 96 99 94  BUN 6 - 20 mg/dL _0 Creatinine 0.44 - 1.00 mg/dL 0.76 0.80 0.82  Sodium 135 - 145 mmol/L 140 141 141  Potassium 3.5 - 5.1 mmol/L 3.9 3.6 3.8  Chloride 98 - 111 mmol/L 102 103 104  CO2 22 - 32 mmol/L 33(H) 32 30  Calcium 8.9 - 10.3 mg/dL 9.4 8.9 9.3  Total Protein 6.5 - 8.1 g/dL 6.8 6.8 6.6  Total Bilirubin 0.3 - 1.2 mg/dL 0.4 0.4 0.3  Alkaline Phos 38 - 126 U/L 154(H) 143(H) 134(H)  AST 15 - 41 U/L 31 34 21  ALT 0 - 44 U/L 34 36 23    Lab Results  Component Value Date   WBC 5.3 07/01/2021    HGB 11.0 (L) 07/01/2021   HCT 34.2 (L) 07/01/2021   MCV 88.4 07/01/2021   PLT 264 07/01/2021   NEUTROABS 4.0 07/01/2021    ASSESSMENT & PLAN:  No problem-specific Assessment & Plan notes found for this encounter.    No orders of the defined types were placed in this encounter.  The patient has a good understanding of the overall plan. she agrees with it. she will call with any problems that may develop before the next visit here.  Total time spent: *** mins including face to face time and time spent for planning, charting and coordination of care  Rulon Eisenmenger, MD, MPH 07/15/2021  I, Thana Ates, am acting as scribe for Dr. Nicholas Lose.  {insert scribe attestation}

## 2021-07-16 ENCOUNTER — Inpatient Hospital Stay (HOSPITAL_BASED_OUTPATIENT_CLINIC_OR_DEPARTMENT_OTHER): Payer: 59 | Admitting: Hematology and Oncology

## 2021-07-16 ENCOUNTER — Other Ambulatory Visit: Payer: Self-pay

## 2021-07-16 ENCOUNTER — Inpatient Hospital Stay: Payer: 59 | Attending: Oncology

## 2021-07-16 DIAGNOSIS — Z5111 Encounter for antineoplastic chemotherapy: Secondary | ICD-10-CM | POA: Diagnosis not present

## 2021-07-16 DIAGNOSIS — C7951 Secondary malignant neoplasm of bone: Secondary | ICD-10-CM | POA: Insufficient documentation

## 2021-07-16 DIAGNOSIS — C50112 Malignant neoplasm of central portion of left female breast: Secondary | ICD-10-CM

## 2021-07-16 DIAGNOSIS — Z923 Personal history of irradiation: Secondary | ICD-10-CM | POA: Diagnosis not present

## 2021-07-16 DIAGNOSIS — C50211 Malignant neoplasm of upper-inner quadrant of right female breast: Secondary | ICD-10-CM

## 2021-07-16 DIAGNOSIS — C787 Secondary malignant neoplasm of liver and intrahepatic bile duct: Secondary | ICD-10-CM | POA: Insufficient documentation

## 2021-07-16 DIAGNOSIS — Z79899 Other long term (current) drug therapy: Secondary | ICD-10-CM | POA: Diagnosis not present

## 2021-07-16 DIAGNOSIS — R918 Other nonspecific abnormal finding of lung field: Secondary | ICD-10-CM | POA: Insufficient documentation

## 2021-07-16 DIAGNOSIS — Z170 Estrogen receptor positive status [ER+]: Secondary | ICD-10-CM | POA: Insufficient documentation

## 2021-07-16 DIAGNOSIS — Z95828 Presence of other vascular implants and grafts: Secondary | ICD-10-CM

## 2021-07-16 LAB — CBC WITH DIFFERENTIAL/PLATELET
Abs Immature Granulocytes: 0.05 10*3/uL (ref 0.00–0.07)
Basophils Absolute: 0.1 10*3/uL (ref 0.0–0.1)
Basophils Relative: 1 %
Eosinophils Absolute: 0.3 10*3/uL (ref 0.0–0.5)
Eosinophils Relative: 4 %
HCT: 35 % — ABNORMAL LOW (ref 36.0–46.0)
Hemoglobin: 11.2 g/dL — ABNORMAL LOW (ref 12.0–15.0)
Immature Granulocytes: 1 %
Lymphocytes Relative: 14 %
Lymphs Abs: 1 10*3/uL (ref 0.7–4.0)
MCH: 28.5 pg (ref 26.0–34.0)
MCHC: 32 g/dL (ref 30.0–36.0)
MCV: 89.1 fL (ref 80.0–100.0)
Monocytes Absolute: 0.6 10*3/uL (ref 0.1–1.0)
Monocytes Relative: 8 %
Neutro Abs: 5.3 10*3/uL (ref 1.7–7.7)
Neutrophils Relative %: 72 %
Platelets: 221 10*3/uL (ref 150–400)
RBC: 3.93 MIL/uL (ref 3.87–5.11)
RDW: 19.7 % — ABNORMAL HIGH (ref 11.5–15.5)
WBC: 7.2 10*3/uL (ref 4.0–10.5)
nRBC: 0 % (ref 0.0–0.2)

## 2021-07-16 LAB — COMPREHENSIVE METABOLIC PANEL
ALT: 99 U/L — ABNORMAL HIGH (ref 0–44)
AST: 74 U/L — ABNORMAL HIGH (ref 15–41)
Albumin: 3.7 g/dL (ref 3.5–5.0)
Alkaline Phosphatase: 236 U/L — ABNORMAL HIGH (ref 38–126)
Anion gap: 8 (ref 5–15)
BUN: 13 mg/dL (ref 6–20)
CO2: 30 mmol/L (ref 22–32)
Calcium: 9.5 mg/dL (ref 8.9–10.3)
Chloride: 102 mmol/L (ref 98–111)
Creatinine, Ser: 0.69 mg/dL (ref 0.44–1.00)
GFR, Estimated: >60 mL/min (ref >60)
Glucose, Bld: 88 mg/dL (ref 70–99)
Potassium: 3.5 mmol/L (ref 3.5–5.1)
Sodium: 140 mmol/L (ref 135–145)
Total Bilirubin: 0.6 mg/dL (ref 0.3–1.2)
Total Protein: 6.9 g/dL (ref 6.5–8.1)

## 2021-07-16 MED ORDER — SODIUM CHLORIDE 0.9% FLUSH
10.0000 mL | INTRAVENOUS | Status: DC | PRN
  Administered 2021-07-16: 10 mL via INTRAVENOUS

## 2021-07-16 MED ORDER — HEPARIN SOD (PORK) LOCK FLUSH 100 UNIT/ML IV SOLN
500.0000 [IU] | Freq: Once | INTRAVENOUS | Status: AC
  Administered 2021-07-16: 500 [IU] via INTRAVENOUS

## 2021-07-16 MED FILL — Fosaprepitant Dimeglumine For IV Infusion 150 MG (Base Eq): INTRAVENOUS | Qty: 5 | Status: AC

## 2021-07-16 MED FILL — Dexamethasone Sodium Phosphate Inj 100 MG/10ML: INTRAMUSCULAR | Qty: 1 | Status: AC

## 2021-07-16 NOTE — Progress Notes (Signed)
Patient Care Team: Kelton Pillar, MD as PCP - General (Family Medicine) Jacelyn Pi, MD as Consulting Physician (Endocrinology) Jovita Kussmaul, MD as Consulting Physician (General Surgery) [name], MD as Attending Physician (Radiation Oncology) Belva Crome, MD as Consulting Physician (Cardiology) [name] [name], NP as Nurse Practitioner (Hematology and Oncology) Lajoyce Lauber, DMD (Dentistry) Raina Mina, RPH-CPP (Pharmacist) Nicholas Lose, MD as Consulting Physician (Hematology and Oncology)  DIAGNOSIS:  Encounter Diagnosis  Name Primary?   Malignant neoplasm of upper-inner quadrant of right breast in female, estrogen receptor positive ([facility])     SUMMARY OF ONCOLOGIC HISTORY: Oncology History  Malignant neoplasm of upper-inner quadrant of right breast in female, estrogen receptor positive ([place])  10/15/2015 Initial Biopsy   Right breast upper inner quadrant biopsy: IDC, DCIS, grade 2, ER+(90%), PR+(50%), Ki67 70%, HER-2 negative (ratio 1.18).   10/17/2015 Initial Diagnosis   Malignant neoplasm of upper-inner quadrant of right breast in female, estrogen receptor positive ([place])   10/2015 -  Anti-estrogen oral therapy   Tamoxifen daily   11/28/2015 Surgery   Left breast mammoplasty: IDC, grade 1, 1.2cm, DCIS, lobular neoplasia, T1c, Nx, ER 80%, PR60%, Ki67 3% HER-2 neg Right breast lumpectomy: IDC, grade 1, 2.3cm, DCIS. T2N1a ER/PR+HER-2 neg, Ki 70%.   11/28/2015 Miscellaneous   Mammaprint was Low Risk. Predicting a 98% chance of no disease recurrence within 5 years with anti-estrogens as the only systemic therapy.  It also predicts minimal to no benefit from chemotherapy.     12/26/2015 Surgery   Bilateral mastectomies: bilateral sentinel node sampling, Right: no residual carcinoma, left, focal DCIS, 4 SLN negative   02/11/2016 - 03/27/2016 Radiation Therapy   Adjuvant radiation Debbie [name]): 1) Right chest wall: 50.4 Gy in 28 fractions  2) Right chest  wall boost: 10 Gy in 5 fractions   08/04/2017 Relapse/Recurrence   08/04/2017, MRI of the liver and total spine MRI 08/11/2017 showed liver and bone metastasis; liver biopsy 09/07/2017 confirms estrogen receptor positive, progesterone receptor negative, HER-2 negative metastatic breast cancer; chest CT scan 08/13/2017 shows multiple bilateral pulmonary nodules.   08/17/2017 - 01/04/2018 Anti-estrogen oral therapy   fulvestrant  palbociclib started 08/31/2017 discontinued 01/04/2018 with evidence of progression in the liver   08/2017 Miscellaneous   [facility] report on liver biopsy from April 2019 shows a negative PDL 1, stable MSI and proficient mismatch repair status.  The tumor is positive for the androgen receptor, and for PTEN for PIK3 CA mutations.  HER-2/neu was read as equivocal (it was negative by FISH on the pathology report).   01/04/2018 Miscellaneous   anastrozole started 01/04/2018 everolimus started 01/29/2018   02/16/2018 - 03/01/2018 Radiation Therapy   01/28/18 prophylactic left femur intramedullary nail surgery for impending left femur pathologic fracture alliative radiation with Dr. Isidore [name] to the left femur and left ilium from 02/16/2018 to 03/01/2018   01/30/2020 - 05/2020 Anti-estrogen oral therapy   alpelisib started 01/30/2020 at 300 mg daily with Metformin 500 mg daily and Faslodex (stopped for progression)   05/22/2020 - 08/2020 Chemotherapy   Xeloda discontinued for progression   09/10/2020 - 06/18/2021 Chemotherapy   Patient is on Treatment Plan : BREAST Paclitaxel D1,D15 q28d     07/17/2021 -  Chemotherapy   Patient is on Treatment Plan : BREAST METASTATIC Sacituzumab govitecan-hziy Debbie [name]) q21d     Bilateral malignant neoplasm of breast in female, estrogen receptor positive ([place])  12/26/2015 Initial Diagnosis   Bilateral malignant neoplasm of breast in female, estrogen receptor positive ([place])  07/17/2021 -  Chemotherapy   Patient is on Treatment Plan : BREAST METASTATIC  Sacituzumab govitecan-hziy Debbie [name]) q21d     Liver metastases ([place])  08/13/2017 Initial Diagnosis   Liver metastases ([place])   07/17/2021 -  Chemotherapy   Patient is on Treatment Plan : BREAST METASTATIC Sacituzumab govitecan-hziy Debbie [name]) q21d     Lung metastases ([place])  09/10/2020 Initial Diagnosis   Lung metastases ([place])   07/17/2021 -  Chemotherapy   Patient is on Treatment Plan : BREAST METASTATIC Sacituzumab govitecan-hziy Debbie [name]) q21d       CHIEF COMPLIANT: Cycle 1 Debbie [name]  INTERVAL HISTORY: Debbie [name] is a 56 year old with above-mentioned history of metastatic breast cancer being treated with Taxol.  With the slow progression of disease we are switching her treatment to [place].  IHC on the final path came back as 0.  Tomorrow she received cycle 1 of Trodelvy.  She continues to have right arm lymphedema.  She wears a sleeve.   ALLERGIES:  is allergic to ace inhibitors, atorvastatin, simvastatin, and percocet [oxycodone-acetaminophen].  MEDICATIONS:  Current Outpatient Medications  Medication Sig Dispense Refill   acetaminophen (TYLENOL) 500 MG tablet Take 1,000 mg by mouth every 6 (six) hours as needed.     Calcium-Magnesium 250-125 MG TABS Take 1 tablet by mouth daily. 120 each 4   cetirizine (ZYRTEC) 10 MG tablet Take 10 mg by mouth daily as needed for allergies.      chlorhexidine (PERIDEX) 0.12 % solution Use 5 mLs in the mouth or throat 2 (two) times daily as needed as directed. Do not swallow 473 mL 6   doxycycline (VIBRA-TABS) 100 MG tablet Take 1 tablet (100 mg total) by mouth daily. 30 tablet 2   furosemide (LASIX) 20 MG tablet Take 1 tablet (20 mg total) by mouth daily. 90 tablet 6   levothyroxine (SYNTHROID) 175 MCG tablet Take 1 tablet (175 mcg total) by mouth in the morning on an empty stomach 30 tablet 6   losartan-hydrochlorothiazide (HYZAAR) 100-25 MG tablet Take 1 tablet by mouth once a day 90 tablet 2   Multiple Vitamin (MULTIVITAMIN) tablet Take  1 tablet by mouth daily.     omega-3 acid ethyl esters (LOVAZA) 1 g capsule Take 1 g by mouth 2 (two) times daily.      omeprazole (PRILOSEC) 40 MG capsule Take 1 capsule (40 mg total) by mouth daily. 30 capsule 3   oxyCODONE (OXY IR/ROXICODONE) 5 MG immediate release tablet Take 1 tablet (5 mg total) by mouth every 4 (four) hours as needed for severe pain. 60 tablet 0   potassium chloride SA (KLOR-CON) 20 MEQ tablet Take 2 tablets (40 mEq total) by mouth daily. 90 tablet 4   pregabalin (LYRICA) 100 MG capsule Take 1 capsule (100 mg total) by mouth 3 (three) times daily. 90 capsule 1   rosuvastatin (CRESTOR) 5 MG tablet Take 1 tablet (5 mg total) by mouth daily. 90 tablet 1   sertraline (ZOLOFT) 100 MG tablet TAKE 1 TABLET BY MOUTH ONCE A DAY 30 tablet 12   valACYclovir (VALTREX) 1000 MG tablet Take 1 tablet (1,000 mg total) by mouth daily. 60 tablet 6   Vitamin D, Cholecalciferol, 1000 units CAPS Take 1 tablet by mouth daily. (Patient taking differently: Take 1,000 Units by mouth daily.) 90 capsule 4   No current facility-administered medications for this visit.    PHYSICAL EXAMINATION: ECOG PERFORMANCE STATUS: 1 - Symptomatic but completely ambulatory  Vitals:   07/16/21 1052  BP: Marland Kitchen)  170/67  Pulse: 96  Resp: 18  Temp: 97.7 F (36.5 C)  SpO2: 97%   Filed Weights   07/16/21 1052  Weight: 293 lb 9.6 oz (133.2 kg)       LABORATORY DATA:  I have reviewed the data as listed CMP Latest Ref Rng & Units 07/16/2021 07/01/2021 06/18/2021  Glucose 70 - 99 mg/dL 88 96 99  BUN 6 - 20 mg/dL _0 Creatinine 0.44 - 1.00 mg/dL 0.69 0.76 0.80  Sodium 135 - 145 mmol/L 140 140 141  Potassium 3.5 - 5.1 mmol/L 3.5 3.9 3.6  Chloride 98 - 111 mmol/L 102 102 103  CO2 22 - 32 mmol/L 30 33(H) 32  Calcium 8.9 - 10.3 mg/dL 9.5 9.4 8.9  Total Protein 6.5 - 8.1 g/dL 6.9 6.8 6.8  Total Bilirubin 0.3 - 1.2 mg/dL 0.6 0.4 0.4  Alkaline Phos 38 - 126 U/L 236(H) 154(H) 143(H)  AST 15 - 41 U/L 74(H) 31 34   ALT 0 - 44 U/L 99(H) 34 36    Lab Results  Component Value Date   WBC 7.2 07/16/2021   HGB 11.2 (L) 07/16/2021   HCT 35.0 (L) 07/16/2021   MCV 89.1 07/16/2021   PLT 221 07/16/2021   NEUTROABS 5.3 07/16/2021    ASSESSMENT & PLAN:  Malignant neoplasm of upper-inner quadrant of right breast in female, estrogen receptor positive ([place]) Metastatic Breast Cancer: ER Pos and Her 2 Neg (previously progressed on Ibrance, fulvestrant, everolimus, alpelisib, Xeloda) Current Treatment: Taxol since 09/10/2020 Chronic right arm lymphedema  CT CAP 06/24/2021: Stable mediastinal and hilar lymphadenopathy, stable to mild increase in the multifocal liver metastases, similar appearance of diffuse mixed lytic and sclerotic bone metastases, multiple small lung nodules stable to mildly increased in the interval   IHC: HER2 0 Current treatment: Palliative treatment with Sacituzumab-Govitecan  Return to clinic in 1 week for toxicity check  No orders of the defined types were placed in this encounter.  The patient has a good understanding of the overall plan. she agrees with it. she will call with any problems that may develop before the next visit here. Total time spent: 30 mins including face to face time and time spent for planning, charting and co-ordination of care   Harriette Ohara, MD 07/16/21

## 2021-07-16 NOTE — Assessment & Plan Note (Signed)
Metastatic Breast Cancer: ER Pos and Her 2?Neg (previously progressed on Ibrance, fulvestrant, everolimus, alpelisib, Xeloda) ?Current Treatment: Taxol since 09/10/2020 ?Chronic right arm lymphedema ? ?CT CAP 06/24/2021: Stable mediastinal and hilar lymphadenopathy, stable to mild increase in the multifocal liver metastases, similar appearance of diffuse mixed lytic and sclerotic bone metastases, multiple small lung nodules stable to mildly increased in the interval ?? ?IHC: HER2 0 ?Plan: Palliative treatment with Sacituzumab-Govitecan ?

## 2021-07-17 ENCOUNTER — Inpatient Hospital Stay: Payer: 59

## 2021-07-17 ENCOUNTER — Encounter: Payer: Self-pay | Admitting: *Deleted

## 2021-07-17 VITALS — BP 131/78 | HR 91 | Temp 98.9°F | Resp 17

## 2021-07-17 DIAGNOSIS — Z5111 Encounter for antineoplastic chemotherapy: Secondary | ICD-10-CM | POA: Diagnosis not present

## 2021-07-17 DIAGNOSIS — Z923 Personal history of irradiation: Secondary | ICD-10-CM | POA: Diagnosis not present

## 2021-07-17 DIAGNOSIS — C787 Secondary malignant neoplasm of liver and intrahepatic bile duct: Secondary | ICD-10-CM

## 2021-07-17 DIAGNOSIS — C50112 Malignant neoplasm of central portion of left female breast: Secondary | ICD-10-CM

## 2021-07-17 DIAGNOSIS — C7800 Secondary malignant neoplasm of unspecified lung: Secondary | ICD-10-CM

## 2021-07-17 DIAGNOSIS — Z79899 Other long term (current) drug therapy: Secondary | ICD-10-CM | POA: Diagnosis not present

## 2021-07-17 DIAGNOSIS — Z170 Estrogen receptor positive status [ER+]: Secondary | ICD-10-CM

## 2021-07-17 DIAGNOSIS — C50211 Malignant neoplasm of upper-inner quadrant of right female breast: Secondary | ICD-10-CM | POA: Diagnosis not present

## 2021-07-17 DIAGNOSIS — R918 Other nonspecific abnormal finding of lung field: Secondary | ICD-10-CM | POA: Diagnosis not present

## 2021-07-17 DIAGNOSIS — C7951 Secondary malignant neoplasm of bone: Secondary | ICD-10-CM | POA: Diagnosis not present

## 2021-07-17 LAB — CANCER ANTIGEN 27.29: CA 27.29: 753.9 U/mL — ABNORMAL HIGH (ref 0.0–38.6)

## 2021-07-17 MED ORDER — ACETAMINOPHEN 325 MG PO TABS
650.0000 mg | ORAL_TABLET | Freq: Once | ORAL | Status: AC
  Administered 2021-07-17: 09:00:00 650 mg via ORAL
  Filled 2021-07-17: qty 2

## 2021-07-17 MED ORDER — SODIUM CHLORIDE 0.9 % IV SOLN
10.0000 mg | Freq: Once | INTRAVENOUS | Status: AC
  Administered 2021-07-17: 10:00:00 10 mg via INTRAVENOUS
  Filled 2021-07-17: qty 10

## 2021-07-17 MED ORDER — DENOSUMAB 120 MG/1.7ML ~~LOC~~ SOLN
120.0000 mg | Freq: Once | SUBCUTANEOUS | Status: AC
  Administered 2021-07-17: 10:00:00 120 mg via SUBCUTANEOUS
  Filled 2021-07-17: qty 1.7

## 2021-07-17 MED ORDER — HEPARIN SOD (PORK) LOCK FLUSH 100 UNIT/ML IV SOLN
500.0000 [IU] | Freq: Once | INTRAVENOUS | Status: AC | PRN
  Administered 2021-07-17: 15:00:00 500 [IU]

## 2021-07-17 MED ORDER — FAMOTIDINE IN NACL 20-0.9 MG/50ML-% IV SOLN
20.0000 mg | Freq: Once | INTRAVENOUS | Status: AC
  Administered 2021-07-17: 09:00:00 20 mg via INTRAVENOUS
  Filled 2021-07-17: qty 50

## 2021-07-17 MED ORDER — ATROPINE SULFATE 1 MG/ML IV SOLN
0.5000 mg | Freq: Once | INTRAVENOUS | Status: AC | PRN
  Administered 2021-07-17: 11:00:00 0.5 mg via INTRAVENOUS
  Filled 2021-07-17: qty 1

## 2021-07-17 MED ORDER — SODIUM CHLORIDE 0.9 % IV SOLN: Freq: Once | INTRAVENOUS | Status: AC

## 2021-07-17 MED ORDER — PALONOSETRON HCL INJECTION 0.25 MG/5ML
0.2500 mg | Freq: Once | INTRAVENOUS | Status: AC
  Administered 2021-07-17: 09:00:00 0.25 mg via INTRAVENOUS
  Filled 2021-07-17: qty 5

## 2021-07-17 MED ORDER — SODIUM CHLORIDE 0.9 % IV SOLN
150.0000 mg | Freq: Once | INTRAVENOUS | Status: AC
  Administered 2021-07-17: 10:00:00 150 mg via INTRAVENOUS
  Filled 2021-07-17: qty 150

## 2021-07-17 MED ORDER — SODIUM CHLORIDE 0.9% FLUSH
10.0000 mL | INTRAVENOUS | Status: DC | PRN
  Administered 2021-07-17: 15:00:00 10 mL

## 2021-07-17 MED ORDER — DIPHENHYDRAMINE HCL 50 MG/ML IJ SOLN
50.0000 mg | Freq: Once | INTRAMUSCULAR | Status: AC
  Administered 2021-07-17: 09:00:00 50 mg via INTRAVENOUS
  Filled 2021-07-17: qty 1

## 2021-07-17 MED ORDER — SODIUM CHLORIDE 0.9 % IV SOLN
1260.0000 mg | Freq: Once | INTRAVENOUS | Status: AC
  Administered 2021-07-17: 1260 mg via INTRAVENOUS
  Filled 2021-07-17: qty 126

## 2021-07-17 NOTE — Progress Notes (Signed)
Ok to treat with ALT 99U/L per Dr Lindi Adie ?

## 2021-07-17 NOTE — Patient Instructions (Signed)
[facility]COL[facility]  Discharge Instructions: Thank you for choosing [facility] to provide your oncology and hematology care.   If you have a lab appointment with the [facility], please go directly to the [facility] and check in at the registration area.   Wear comfortable clothing and clothing appropriate for easy access to any Portacath or PICC line.   We strive to give you quality time with your provider. You may need to reschedule your appointment if you arrive late (15 or more minutes).  Arriving late affects you and other patients whose appointments are after yours.  Also, if you miss three or more appointments without notifying the office, you may be dismissed from the clinic at the providers discretion.      For prescription refill requests, have your pharmacy contact our office and allow 72 hours for refills to be completed.    Today you received the following chemotherapy and/or immunotherapy agents: Debbie [name]      To help prevent nausea and vomiting after your treatment, we encourage you to take your nausea medication as directed.  BELOW ARE SYMPTOMS THAT SHOULD BE REPORTED IMMEDIATELY: *FEVER GREATER THAN 100.4 F (38 C) OR HIGHER *CHILLS OR SWEATING *NAUSEA AND VOMITING THAT IS NOT CONTROLLED WITH YOUR NAUSEA MEDICATION *UNUSUAL SHORTNESS OF BREATH *UNUSUAL BRUISING OR BLEEDING *URINARY PROBLEMS (pain or burning when urinating, or frequent urination) *BOWEL PROBLEMS (unusual diarrhea, constipation, pain near the anus) TENDERNESS IN MOUTH AND THROAT WITH OR WITHOUT PRESENCE OF ULCERS (sore throat, sores in mouth, or a toothache) UNUSUAL RASH, SWELLING OR PAIN  UNUSUAL VAGINAL DISCHARGE OR ITCHING   Items with * indicate a potential emergency and should be followed up as soon as possible or go to the Emergency Department if any problems should occur.  Please show the CHEMOTHERAPY ALERT CARD or IMMUNOTHERAPY ALERT CARD at check-in to  the Emergency Department and triage nurse.  Should you have questions after your visit or need to cancel or reschedule your appointment, please contact [facility]  Dept: [phone]  and follow the prompts.  Office hours are 8:00 a.m. to 4:30 p.m. Monday - Friday. Please note that voicemails left after 4:00 p.m. may not be returned until the following business day.  We are closed weekends and major holidays. You have access to a nurse at all times for urgent questions. Please call the main number to the clinic Dept: [phone] and follow the prompts.   For any non-urgent questions, you may also contact your provider using MyChart. We now offer e-Visits for anyone 73 and older to request care online for non-urgent symptoms. For details visit [email or web address].   Also download the MyChart app! Go to the app store, search "MyChart", open the app, select [facility], and log in with your MyChart username and password.  Due to Covid, a mask is required upon entering the hospital/clinic. If you do not have a mask, one will be given to you upon arrival. For doctor visits, patients may have 1 support person aged 65 or older with them. For treatment visits, patients cannot have anyone with them due to current Covid guidelines and our immunocompromised population.   Sacituzumab Govitecan Injection What is this medication? SACITUZUMAB GOVITECAN (SAK i TOOZ ue mab GOE vi TEE kan) is a monoclonal antibody combined with chemotherapy. It is used to treat breast cancer and urothelial cancer. This medicine may be used for other purposes; ask your health care provider or  pharmacist if you have questions. COMMON BRAND NAME(S): TRODELVY What should I tell my care team before I take this medication? They need to know if you have any of these conditions: diarrhea infection (especially a virus infection such as chickenpox, cold sores, or herpes) liver disease low blood counts,  like low white cell, platelet, or red cell counts an unusual or allergic reaction to sacituzumab govitecan, other medicines, foods, dyes, or preservatives pregnant or trying to get pregnant breast-feeding How should I use this medication? This medicine is for infusion into a vein. It is given by a healthcare professional in a hospital or clinic setting. Talk to your pediatrician about the use of this medicine in children. Special care may be needed. Overdosage: If you think you have taken too much of this medicine contact a poison control center or emergency room at once. NOTE: This medicine is only for you. Do not share this medicine with others. What if I miss a dose? Keep appointments for follow-up doses. It is important not to miss your dose. Call your doctor or health care professional if you are unable to keep an appointment. What may interact with this medication? certain antivirals for HIV or hepatitis gemfibrozil This list may not describe all possible interactions. Give your health care provider a list of all the medicines, herbs, non-prescription drugs, or dietary supplements you use. Also tell them if you smoke, drink alcohol, or use illegal drugs. Some items may interact with your medicine. What should I watch for while using this medication? Your condition will be monitored carefully while you are receiving this medicine. You may need blood work done while you are taking this medicine. This medicine can cause serious allergic reactions. To reduce your risk, you may need to take medicine before treatment with this medicine. Take your medicine as directed. Do not become pregnant while taking this medicine or for 6 months after stopping it. Women should inform their health care professional if they wish to become pregnant or think they might be pregnant. Men should not father a child while taking this medicine and for 3 months after stopping it. There is potential for serious side  effects to an unborn child. Talk to your health care professional for more information. Do not breast-feed a child while taking this medicine or for 1 month after stopping it. This medicine may increase your risk of getting an infection. Call your health care professional for advice if you get a fever, chills, or sore throat, or other symptoms of a cold or flu. Do not treat yourself. Try to avoid being around people who are sick. Avoid taking medicines that contain aspirin, acetaminophen, ibuprofen, naproxen, or ketoprofen unless instructed by your health care professional. These medicines may hide a fever. This medicine has caused ovarian failure in some women. This medicine may make it more difficult to get pregnant. Talk to your health care professional if you are concerned about your fertility. What side effects may I notice from receiving this medication? Side effects that you should report to your doctor or health care professional as soon as possible: allergic reactions like skin rash, itching or hives; swelling of the face, lips, or tongue diarrhea nausea/vomiting signs and symptoms of infection like fever; chills; cough; sore throat; pain or trouble passing urine signs and symptoms of low red blood cells or anemia such as unusually weak or tired; feeling faint or lightheaded; falls; breathing problems Side effects that usually do not require medical attention (report these to  your doctor or health care professional if they continue or are bothersome): constipation hair loss headache loss of appetite signs and symptoms of high blood sugar such as being more thirsty or hungry or having to urinate more than normal. You may also feel very tired or have blurry vision. trouble sleeping This list may not describe all possible side effects. Call your doctor for medical advice about side effects. You may report side effects to FDA at 1-800-FDA-1088. Where should I keep my medication? This  medicine is given in a hospital or clinic and will not be stored at home. NOTE: This sheet is a summary. It may not cover all possible information. If you have questions about this medicine, talk to your doctor, pharmacist, or health care provider.  2022 Elsevier/Gold Standard (2019-08-29 00:00:00)

## 2021-07-17 NOTE — Progress Notes (Signed)
[employer] FMLA completed for patient and sent to Dr Lindi Adie for signature. ?

## 2021-07-18 ENCOUNTER — Telehealth: Payer: Self-pay | Admitting: *Deleted

## 2021-07-18 NOTE — Telephone Encounter (Signed)
Called pt to see how she did with her treatment.  She reports doing well except dry mouth & slight nausea this am which dry crackers helped.  She is at work & has her nausea med if needed.  She reports knowing how to reach [place] if needed.   ?

## 2021-07-18 NOTE — Telephone Encounter (Signed)
-----   Message from Charleston Poot, RN sent at 07/17/2021  2:58 PM EST ----- ?Regarding: First time/ Trodelvy/ Dr Lindi Adie pt ?Hey there,  ? ?Pt had first time [facility] today. Tolerated treatment well. ? ?Thank you, ?Libbie K. ? ?

## 2021-07-21 ENCOUNTER — Other Ambulatory Visit (HOSPITAL_COMMUNITY): Payer: Self-pay

## 2021-07-21 ENCOUNTER — Encounter: Payer: Self-pay | Admitting: Adult Health

## 2021-07-21 ENCOUNTER — Telehealth: Payer: Self-pay | Admitting: *Deleted

## 2021-07-21 ENCOUNTER — Telehealth: Payer: Self-pay

## 2021-07-21 ENCOUNTER — Other Ambulatory Visit: Payer: Self-pay

## 2021-07-21 MED ORDER — ONDANSETRON 8 MG PO TBDP
8.0000 mg | ORAL_TABLET | Freq: Three times a day (TID) | ORAL | 0 refills | Status: DC | PRN
Start: 2021-07-21 — End: 2022-10-14
  Filled 2021-07-21: qty 20, 7d supply, fill #0

## 2021-07-21 NOTE — Telephone Encounter (Signed)
Called and spoke with pt, pt states increased nausea with no relief from compazine.  Pt has been taking small sips of water.  No fever.  Per MD pt can try zofran ODT, pt is willing to try.  Pt aware and knows to call if medication does not help as we may need to start IV fluids and/or add on visit with MD.  Pt verbalized understanding and thanks.   ?

## 2021-07-21 NOTE — Telephone Encounter (Signed)
[employer] form successfully returned via fax to [phone].  Original to alphabetical file folder in registration work area behind registrar number one for patient pick-up. ?

## 2021-07-22 ENCOUNTER — Telehealth: Payer: Self-pay

## 2021-07-22 NOTE — Telephone Encounter (Signed)
Called pt to see how she was feeling, pt reports feeling much better, thanks Debbie [name] for checking on her.  Pt also reports ability to now eat and drink, no n/v.   ?

## 2021-07-23 ENCOUNTER — Other Ambulatory Visit: Payer: Self-pay

## 2021-07-23 DIAGNOSIS — C7800 Secondary malignant neoplasm of unspecified lung: Secondary | ICD-10-CM

## 2021-07-23 MED FILL — Dexamethasone Sodium Phosphate Inj 100 MG/10ML: INTRAMUSCULAR | Qty: 1 | Status: AC

## 2021-07-23 MED FILL — Fosaprepitant Dimeglumine For IV Infusion 150 MG (Base Eq): INTRAVENOUS | Qty: 5 | Status: AC

## 2021-07-24 ENCOUNTER — Inpatient Hospital Stay: Payer: 59

## 2021-07-24 ENCOUNTER — Other Ambulatory Visit: Payer: Self-pay

## 2021-07-24 ENCOUNTER — Other Ambulatory Visit: Payer: Self-pay | Admitting: Hematology and Oncology

## 2021-07-24 VITALS — BP 134/76 | HR 82 | Temp 98.2°F | Resp 16 | Wt 290.5 lb

## 2021-07-24 DIAGNOSIS — Z5111 Encounter for antineoplastic chemotherapy: Secondary | ICD-10-CM | POA: Diagnosis not present

## 2021-07-24 DIAGNOSIS — C7951 Secondary malignant neoplasm of bone: Secondary | ICD-10-CM | POA: Diagnosis not present

## 2021-07-24 DIAGNOSIS — Z79899 Other long term (current) drug therapy: Secondary | ICD-10-CM | POA: Diagnosis not present

## 2021-07-24 DIAGNOSIS — R918 Other nonspecific abnormal finding of lung field: Secondary | ICD-10-CM | POA: Diagnosis not present

## 2021-07-24 DIAGNOSIS — C50211 Malignant neoplasm of upper-inner quadrant of right female breast: Secondary | ICD-10-CM | POA: Diagnosis not present

## 2021-07-24 DIAGNOSIS — Z923 Personal history of irradiation: Secondary | ICD-10-CM | POA: Diagnosis not present

## 2021-07-24 DIAGNOSIS — C787 Secondary malignant neoplasm of liver and intrahepatic bile duct: Secondary | ICD-10-CM | POA: Diagnosis not present

## 2021-07-24 DIAGNOSIS — Z170 Estrogen receptor positive status [ER+]: Secondary | ICD-10-CM | POA: Diagnosis not present

## 2021-07-24 LAB — CMP (CANCER CENTER ONLY)
ALT: 46 U/L — ABNORMAL HIGH (ref 0–44)
AST: 34 U/L (ref 15–41)
Albumin: 3.7 g/dL (ref 3.5–5.0)
Alkaline Phosphatase: 207 U/L — ABNORMAL HIGH (ref 38–126)
Anion gap: 6 (ref 5–15)
BUN: 17 mg/dL (ref 6–20)
CO2: 27 mmol/L (ref 22–32)
Calcium: 9.3 mg/dL (ref 8.9–10.3)
Chloride: 106 mmol/L (ref 98–111)
Creatinine: 0.9 mg/dL (ref 0.44–1.00)
GFR, Estimated: >60 mL/min (ref >60)
Glucose, Bld: 97 mg/dL (ref 70–99)
Potassium: 4 mmol/L (ref 3.5–5.1)
Sodium: 139 mmol/L (ref 135–145)
Total Bilirubin: 0.4 mg/dL (ref 0.3–1.2)
Total Protein: 7.3 g/dL (ref 6.5–8.1)

## 2021-07-24 LAB — CBC WITH DIFFERENTIAL (CANCER CENTER ONLY)
Abs Immature Granulocytes: 0.01 10*3/uL (ref 0.00–0.07)
Basophils Absolute: 0 10*3/uL (ref 0.0–0.1)
Basophils Relative: 2 %
Eosinophils Absolute: 0.2 10*3/uL (ref 0.0–0.5)
Eosinophils Relative: 10 %
HCT: 35 % — ABNORMAL LOW (ref 36.0–46.0)
Hemoglobin: 11.2 g/dL — ABNORMAL LOW (ref 12.0–15.0)
Immature Granulocytes: 1 %
Lymphocytes Relative: 25 %
Lymphs Abs: 0.5 10*3/uL — ABNORMAL LOW (ref 0.7–4.0)
MCH: 28.5 pg (ref 26.0–34.0)
MCHC: 32 g/dL (ref 30.0–36.0)
MCV: 89.1 fL (ref 80.0–100.0)
Monocytes Absolute: 0.2 10*3/uL (ref 0.1–1.0)
Monocytes Relative: 13 %
Neutro Abs: 0.9 10*3/uL — ABNORMAL LOW (ref 1.7–7.7)
Neutrophils Relative %: 49 %
Platelet Count: 253 10*3/uL (ref 150–400)
RBC: 3.93 MIL/uL (ref 3.87–5.11)
RDW: 18.8 % — ABNORMAL HIGH (ref 11.5–15.5)
WBC Count: 1.9 10*3/uL — ABNORMAL LOW (ref 4.0–10.5)
nRBC: 0 % (ref 0.0–0.2)

## 2021-07-24 MED ORDER — HEPARIN SOD (PORK) LOCK FLUSH 100 UNIT/ML IV SOLN
500.0000 [IU] | Freq: Once | INTRAVENOUS | Status: AC | PRN
  Administered 2021-07-24: 500 [IU]

## 2021-07-24 MED ORDER — FAMOTIDINE IN NACL 20-0.9 MG/50ML-% IV SOLN
20.0000 mg | Freq: Once | INTRAVENOUS | Status: AC
  Administered 2021-07-24: 20 mg via INTRAVENOUS
  Filled 2021-07-24: qty 50

## 2021-07-24 MED ORDER — PALONOSETRON HCL INJECTION 0.25 MG/5ML
0.2500 mg | Freq: Once | INTRAVENOUS | Status: AC
  Administered 2021-07-24: 0.25 mg via INTRAVENOUS
  Filled 2021-07-24: qty 5

## 2021-07-24 MED ORDER — SODIUM CHLORIDE 0.9 % IV SOLN
150.0000 mg | Freq: Once | INTRAVENOUS | Status: AC
  Administered 2021-07-24: 150 mg via INTRAVENOUS
  Filled 2021-07-24: qty 150

## 2021-07-24 MED ORDER — DIPHENHYDRAMINE HCL 50 MG/ML IJ SOLN
50.0000 mg | Freq: Once | INTRAMUSCULAR | Status: AC
  Administered 2021-07-24: 50 mg via INTRAVENOUS
  Filled 2021-07-24: qty 1

## 2021-07-24 MED ORDER — SODIUM CHLORIDE 0.9 % IV SOLN: Freq: Once | INTRAVENOUS | Status: AC

## 2021-07-24 MED ORDER — ATROPINE SULFATE 1 MG/ML IV SOLN
0.5000 mg | Freq: Once | INTRAVENOUS | Status: AC | PRN
  Administered 2021-07-24: 0.5 mg via INTRAVENOUS
  Filled 2021-07-24: qty 1

## 2021-07-24 MED ORDER — SODIUM CHLORIDE 0.9% FLUSH
10.0000 mL | INTRAVENOUS | Status: AC | PRN
  Administered 2021-07-24: 10 mL

## 2021-07-24 MED ORDER — SODIUM CHLORIDE 0.9 % IV SOLN
8.0000 mg/kg | Freq: Once | INTRAVENOUS | Status: AC
  Administered 2021-07-24: 1080 mg via INTRAVENOUS
  Filled 2021-07-24: qty 108

## 2021-07-24 MED ORDER — ACETAMINOPHEN 325 MG PO TABS
650.0000 mg | ORAL_TABLET | Freq: Once | ORAL | Status: AC
  Administered 2021-07-24: 650 mg via ORAL
  Filled 2021-07-24: qty 2

## 2021-07-24 MED ORDER — SODIUM CHLORIDE 0.9% FLUSH
10.0000 mL | INTRAVENOUS | Status: DC | PRN
  Administered 2021-07-24: 10 mL

## 2021-07-24 MED ORDER — SODIUM CHLORIDE 0.9 % IV SOLN
10.0000 mg | Freq: Once | INTRAVENOUS | Status: AC
  Administered 2021-07-24: 10 mg via INTRAVENOUS
  Filled 2021-07-24: qty 10

## 2021-07-24 NOTE — Progress Notes (Signed)
Ok to treat with ANC 0.9 K/uL per Dr Lindi Adie, He has reduced dose of Trodelvy today. ?

## 2021-07-24 NOTE — Patient Instructions (Signed)
Sacituzumab Govitecan Injection What is this medication? SACITUZUMAB GOVITECAN (SAK i TOOZ ue mab GOE vi TEE kan) is a monoclonal antibody combined with chemotherapy. It is used to treat breast cancer and urothelial cancer. This medicine may be used for other purposes; ask your health care provider or pharmacist if you have questions. COMMON BRAND NAME(S): TRODELVY What should I tell my care team before I take this medication? They need to know if you have any of these conditions: diarrhea infection (especially a virus infection such as chickenpox, cold sores, or herpes) liver disease low blood counts, like low white cell, platelet, or red cell counts an unusual or allergic reaction to sacituzumab govitecan, other medicines, foods, dyes, or preservatives pregnant or trying to get pregnant breast-feeding How should I use this medication? This medicine is for infusion into a vein. It is given by a healthcare professional in a hospital or clinic setting. Talk to your pediatrician about the use of this medicine in children. Special care may be needed. Overdosage: If you think you have taken too much of this medicine contact a poison control center or emergency room at once. NOTE: This medicine is only for you. Do not share this medicine with others. What if I miss a dose? Keep appointments for follow-up doses. It is important not to miss your dose. Call your doctor or health care professional if you are unable to keep an appointment. What may interact with this medication? certain antivirals for HIV or hepatitis gemfibrozil This list may not describe all possible interactions. Give your health care provider a list of all the medicines, herbs, non-prescription drugs, or dietary supplements you use. Also tell them if you smoke, drink alcohol, or use illegal drugs. Some items may interact with your medicine. What should I watch for while using this medication? Your condition will be monitored  carefully while you are receiving this medicine. You may need blood work done while you are taking this medicine. This medicine can cause serious allergic reactions. To reduce your risk, you may need to take medicine before treatment with this medicine. Take your medicine as directed. Do not become pregnant while taking this medicine or for 6 months after stopping it. Women should inform their health care professional if they wish to become pregnant or think they might be pregnant. Men should not father a child while taking this medicine and for 3 months after stopping it. There is potential for serious side effects to an unborn child. Talk to your health care professional for more information. Do not breast-feed a child while taking this medicine or for 1 month after stopping it. This medicine may increase your risk of getting an infection. Call your health care professional for advice if you get a fever, chills, or sore throat, or other symptoms of a cold or flu. Do not treat yourself. Try to avoid being around people who are sick. Avoid taking medicines that contain aspirin, acetaminophen, ibuprofen, naproxen, or ketoprofen unless instructed by your health care professional. These medicines may hide a fever. This medicine has caused ovarian failure in some women. This medicine may make it more difficult to get pregnant. Talk to your health care professional if you are concerned about your fertility. What side effects may I notice from receiving this medication? Side effects that you should report to your doctor or health care professional as soon as possible: allergic reactions like skin rash, itching or hives; swelling of the face, lips, or tongue diarrhea nausea/vomiting signs and symptoms   of infection like fever; chills; cough; sore throat; pain or trouble passing urine ?signs and symptoms of low red blood cells or anemia such as unusually weak or tired; feeling faint or lightheaded; falls;  breathing problems ?Side effects that usually do not require medical attention (report these to your doctor or health care professional if they continue or are bothersome): ?constipation ?hair loss ?headache ?loss of appetite ?signs and symptoms of high blood sugar such as being more thirsty or hungry or having to urinate more than normal. You may also feel very tired or have blurry vision. ?trouble sleeping ?This list may not describe all possible side effects. Call your doctor for medical advice about side effects. You may report side effects to FDA at 1-800-FDA-1088. ?Where should I keep my medication? ?This medicine is given in a hospital or clinic and will not be stored at home. ?NOTE: This sheet is a summary. It may not cover all possible information. If you have questions about this medicine, talk to your doctor, pharmacist, or health care provider. ?? 2022 Elsevier/Gold Standard (2019-08-29 00:00:00) ? ?

## 2021-07-24 NOTE — Progress Notes (Signed)
Lowered the dosage of Trodelvy for ANC 0.9 ?

## 2021-07-25 LAB — CANCER ANTIGEN 27.29: CA 27.29: 698.7 U/mL — ABNORMAL HIGH (ref 0.0–38.6)

## 2021-07-29 ENCOUNTER — Ambulatory Visit: Payer: 59

## 2021-07-29 ENCOUNTER — Other Ambulatory Visit: Payer: 59

## 2021-07-29 ENCOUNTER — Ambulatory Visit: Payer: 59 | Admitting: Hematology and Oncology

## 2021-07-31 ENCOUNTER — Telehealth: Payer: Self-pay

## 2021-07-31 ENCOUNTER — Encounter: Payer: Self-pay | Admitting: Adult Health

## 2021-07-31 NOTE — Telephone Encounter (Signed)
Called and spoke with pt, pt reports 5-6 episodes of diarrhea each day this week but states she is maintaining PO intake with bland foods , gatorade, and pedialyte.  I recommended pt come in to be evaluated tomorrow but pt is reluctant.  Pt did agree to call back tomorrow to inform us of any changes.   ?

## 2021-08-01 ENCOUNTER — Telehealth: Payer: Self-pay | Admitting: *Deleted

## 2021-08-01 ENCOUNTER — Other Ambulatory Visit (HOSPITAL_COMMUNITY): Payer: Self-pay

## 2021-08-01 ENCOUNTER — Other Ambulatory Visit: Payer: Self-pay | Admitting: Hematology and Oncology

## 2021-08-01 MED ORDER — DIPHENOXYLATE-ATROPINE 2.5-0.025 MG PO TABS
1.0000 | ORAL_TABLET | Freq: Four times a day (QID) | ORAL | 2 refills | Status: DC | PRN
  Filled 2021-08-01: qty 30, 8d supply, fill #0
  Filled 2022-02-17: qty 30, 8d supply, fill #1

## 2021-08-01 NOTE — Telephone Encounter (Signed)
This RN spoke with pt- who states 1 episode early this morning of diarrhea -she is now using kaopectate with some benefit. ? ?She states she is presently at work and was able to eat some this am. ? ?Per phone discussion- she declined need per offer for IVFs - " I am able to hydrate well ". ? ?Per review with MD- and noted pt not using imodium- prescription for lomotil sent to [facility]. ?

## 2021-08-06 ENCOUNTER — Other Ambulatory Visit (HOSPITAL_COMMUNITY): Payer: Self-pay

## 2021-08-06 ENCOUNTER — Other Ambulatory Visit: Payer: Self-pay | Admitting: *Deleted

## 2021-08-06 ENCOUNTER — Other Ambulatory Visit: Payer: Self-pay

## 2021-08-06 DIAGNOSIS — C7800 Secondary malignant neoplasm of unspecified lung: Secondary | ICD-10-CM

## 2021-08-06 MED ORDER — SERTRALINE HCL 100 MG PO TABS
ORAL_TABLET | ORAL | 12 refills | Status: DC
  Filled 2021-08-06: qty 30, 30d supply, fill #0
  Filled 2021-09-09: qty 30, 30d supply, fill #1
  Filled 2021-10-08: qty 30, 30d supply, fill #2
  Filled 2021-11-05: qty 30, 30d supply, fill #3
  Filled 2021-12-02: qty 30, 30d supply, fill #4
  Filled 2022-01-01: qty 30, 30d supply, fill #5
  Filled 2022-01-29: qty 30, 30d supply, fill #6
  Filled 2022-02-23: qty 30, 30d supply, fill #7
  Filled 2022-03-26: qty 30, 30d supply, fill #8
  Filled 2022-04-23: qty 30, 30d supply, fill #9

## 2021-08-06 NOTE — Assessment & Plan Note (Addendum)
Metastatic Breast Cancer: ER Pos and Her 2?Neg?(previously progressed on Ibrance, fulvestrant, everolimus, alpelisib, Xeloda) ?Current Treatment: Taxol since 09/10/2020 ?Chronic right arm lymphedema ?? ?CT CAP 06/24/2021: Stable mediastinal and hilar lymphadenopathy, stable to mild increase in the multifocal liver metastases, similar appearance of diffuse mixed lytic and sclerotic bone metastases, multiple small lung nodules stable to mildly increased in the interval ?? ?IHC: HER2 0 ?Current treatment: Palliative treatment with Sacituzumab-Govitecan, today cycle 2 ? ?Toxicities: ?1.  Neutropenia: We will reduce the dosage of Trodelvy ?2. Fatigue ?3.  Chemotherapy-induced anemia today's hemoglobin is 10.5 ? ?Return to clinic for Trodelvy day 1 day 8 every 3 weeks. ?

## 2021-08-06 NOTE — Progress Notes (Addendum)
?Patient Care Team: ?Kelton Pillar, MD as PCP - General (Family Medicine) ?Jacelyn Pi, MD as Consulting Physician (Endocrinology) ?Jovita Kussmaul, MD as Consulting Physician (General Surgery) ?[name], MD as Attending Physician (Radiation Oncology) ?Belva Crome, MD as Consulting Physician (Cardiology) ?Gardenia Phlegm, NP as Nurse Practitioner (Hematology and Oncology) ?Lajoyce Lauber, DMD (Dentistry) ?Raina Mina, RPH-CPP (Pharmacist) ?Nicholas Lose, MD as Consulting Physician (Hematology and Oncology) ? ?DIAGNOSIS:  ?Encounter Diagnoses  ?Name Primary?  ? Malignant neoplasm of upper-inner quadrant of right breast in female, estrogen receptor positive ([place])   ? Liver metastases ([place]) Yes  ? ? ?SUMMARY OF ONCOLOGIC HISTORY: ?Oncology History  ?Malignant neoplasm of upper-inner quadrant of right breast in female, estrogen receptor positive ([place])  ?10/15/2015 Initial Biopsy  ? Right breast upper inner quadrant biopsy: IDC, DCIS, grade 2, ER+(90%), PR+(50%), Ki67 70%, HER-2 negative (ratio 1.18). ?  ?10/17/2015 Initial Diagnosis  ? Malignant neoplasm of upper-inner quadrant of right breast in female, estrogen receptor positive ([place]) ?  ?10/2015 -  Anti-estrogen oral therapy  ? Tamoxifen daily ?  ?11/28/2015 Surgery  ? Left breast mammoplasty: IDC, grade 1, 1.2cm, DCIS, lobular neoplasia, T1c, Nx, ER 80%, PR60%, Ki67 3% HER-2 neg ?Right breast lumpectomy: IDC, grade 1, 2.3cm, DCIS. T2N1a ER/PR+HER-2 neg, Ki 70%. ?  ?11/28/2015 Miscellaneous  ? Mammaprint was Low Risk. Predicting a 98% chance of no disease recurrence within 5 years with anti-estrogens as the only systemic therapy.  It also predicts minimal to no benefit from chemotherapy.   ?  ?12/26/2015 Surgery  ? Bilateral mastectomies: bilateral sentinel node sampling, Right: no residual carcinoma, left, focal DCIS, 4 SLN negative ?  ?02/11/2016 - 03/27/2016 Radiation Therapy  ? Adjuvant radiation [name]): 1) Right chest wall: 50.4 Gy in 28  fractions  2) Right chest wall boost: 10 Gy in 5 fractions ?  ?08/04/2017 Relapse/Recurrence  ? 08/04/2017, MRI of the liver and total spine MRI 08/11/2017 showed liver and bone metastasis; liver biopsy 09/07/2017 confirms estrogen receptor positive, progesterone receptor negative, HER-2 negative metastatic breast cancer; chest CT scan 08/13/2017 shows multiple bilateral pulmonary nodules. ?  ?08/17/2017 - 01/04/2018 Anti-estrogen oral therapy  ? fulvestrant  palbociclib started 08/31/2017 discontinued 01/04/2018 with evidence of progression in the liver ?  ?08/2017 Miscellaneous  ? [facility] report on liver biopsy from April 2019 shows a negative PDL 1, stable MSI and proficient mismatch repair status.  The tumor is positive for the androgen receptor, and for PTEN for PIK3 CA mutations.  HER-2/neu was read as equivocal (it was negative by FISH on the pathology report). ?  ?01/04/2018 Miscellaneous  ? anastrozole started 01/04/2018 ?everolimus started 01/29/2018 ?  ?02/16/2018 - 03/01/2018 Radiation Therapy  ? 01/28/18 prophylactic left femur intramedullary nail surgery for impending left femur pathologic fracture ?alliative radiation with Dr. [name] to the left femur and left ilium from 02/16/2018 to 03/01/2018 ?  ?01/30/2020 - 05/2020 Anti-estrogen oral therapy  ? alpelisib started 01/30/2020 at 300 mg daily with Metformin 500 mg daily and Faslodex (stopped for progression) ?  ?05/22/2020 - 08/2020 Chemotherapy  ? Xeloda discontinued for progression ?  ?09/10/2020 - 06/18/2021 Chemotherapy  ? Patient is on Treatment Plan : BREAST Paclitaxel D1,D15 q28d  ?   ?07/17/2021 -  Chemotherapy  ? Patient is on Treatment Plan : BREAST METASTATIC Sacituzumab govitecan-hziy [name]) q21d  ?   ?Bilateral malignant neoplasm of breast in female, estrogen receptor positive ([place])  ?12/26/2015 Initial Diagnosis  ? Bilateral malignant neoplasm of breast in  female, estrogen receptor positive ([place]) ?  ?07/17/2021 -  Chemotherapy  ? Patient is on Treatment  Plan : BREAST METASTATIC Sacituzumab govitecan-hziy [name]) q21d  ?   ?Liver metastases ([place])  ?08/13/2017 Initial Diagnosis  ? Liver metastases ([place]) ?  ?07/17/2021 -  Chemotherapy  ? Patient is on Treatment Plan : BREAST METASTATIC Sacituzumab govitecan-hziy [name]) q21d  ?   ?Lung metastases ([place])  ?09/10/2020 Initial Diagnosis  ? Lung metastases ([place]) ?  ?07/17/2021 -  Chemotherapy  ? Patient is on Treatment Plan : BREAST METASTATIC Sacituzumab govitecan-hziy [name]) q21d  ?   ? ? ?CHIEF COMPLIANT: Cycle 2 Trodelvy ? ?INTERVAL HISTORY: Debbie [name] is a 56 year old with above-mentioned history of metastatic breast cancer. She presents to the clinic for a follow-up. She complains of nausea and diarrhea after the [place]. Overall today she is feeling a lot of better. Denies mouth sores but has very dry skin. ?She has symptoms of alternating constipation and diarrhea because of which her hemorrhoids are also getting worse. ?Nausea starts before and goes on to day 7. ? ? ?ALLERGIES:  is allergic to ace inhibitors, atorvastatin, simvastatin, and percocet [oxycodone-acetaminophen]. ? ?MEDICATIONS:  ?Current Outpatient Medications  ?Medication Sig Dispense Refill  ? acetaminophen (TYLENOL) 500 MG tablet Take 1,000 mg by mouth every 6 (six) hours as needed.    ? Calcium-Magnesium 250-125 MG TABS Take 1 tablet by mouth daily. 120 each 4  ? cetirizine (ZYRTEC) 10 MG tablet Take 10 mg by mouth daily as needed for allergies.     ? chlorhexidine (PERIDEX) 0.12 % solution Use 5 mLs in the mouth or throat 2 (two) times daily as needed as directed. Do not swallow 473 mL 6  ? diphenoxylate-atropine (LOMOTIL) 2.5-0.025 MG tablet Take 1 tablet by mouth 4 (four) times daily as needed for diarrhea or loose stools. 30 tablet 2  ? doxycycline (VIBRA-TABS) 100 MG tablet Take 1 tablet (100 mg total) by mouth daily. 30 tablet 2  ? furosemide (LASIX) 20 MG tablet Take 1 tablet (20 mg total) by mouth daily. 90 tablet 6  ?  levothyroxine (SYNTHROID) 175 MCG tablet Take 1 tablet (175 mcg total) by mouth in the morning on an empty stomach 30 tablet 6  ? losartan-hydrochlorothiazide (HYZAAR) 100-25 MG tablet Take 1 tablet by mouth once a day 90 tablet 2  ? Multiple Vitamin (MULTIVITAMIN) tablet Take 1 tablet by mouth daily.    ? omega-3 acid ethyl esters (LOVAZA) 1 g capsule Take 1 g by mouth 2 (two) times daily.     ? omeprazole (PRILOSEC) 40 MG capsule Take 1 capsule (40 mg total) by mouth daily. 30 capsule 3  ? ondansetron (ZOFRAN-ODT) 8 MG disintegrating tablet Dissolve 1 tablet (8 mg total) by mouth every 8 (eight) hours as needed for nausea or vomiting. 20 tablet 0  ? oxyCODONE (OXY IR/ROXICODONE) 5 MG immediate release tablet Take 1 tablet (5 mg total) by mouth every 4 (four) hours as needed for severe pain. 60 tablet 0  ? potassium chloride SA (KLOR-CON) 20 MEQ tablet Take 2 tablets (40 mEq total) by mouth daily. 90 tablet 4  ? pregabalin (LYRICA) 100 MG capsule Take 1 capsule (100 mg total) by mouth 3 (three) times daily. 90 capsule 3  ? rosuvastatin (CRESTOR) 5 MG tablet Take 1 tablet (5 mg total) by mouth daily. 90 tablet 1  ? sertraline (ZOLOFT) 100 MG tablet TAKE 1 TABLET BY MOUTH ONCE A DAY 30 tablet 12  ? sertraline (ZOLOFT)  100 MG tablet TAKE 1 TABLET BY MOUTH ONCE DAILY 30 tablet 12  ? valACYclovir (VALTREX) 1000 MG tablet Take 1 tablet (1,000 mg total) by mouth daily. 60 tablet 6  ? Vitamin D, Cholecalciferol, 1000 units CAPS Take 1 tablet by mouth daily. (Patient taking differently: Take 1,000 Units by mouth daily.) 90 capsule 4  ? ?No current facility-administered medications for this visit.  ? ? ?PHYSICAL EXAMINATION: ?ECOG PERFORMANCE STATUS: 1 - Symptomatic but completely ambulatory ? ?Vitals:  ? 08/07/21 0857  ?BP: (!) 175/86  ?Pulse: 79  ?Resp: 18  ?Temp: 98 ?F (36.7 ?C)  ?SpO2: 96%  ? ?Filed Weights  ? 08/07/21 0857  ?Weight: 290 lb 4.8 oz (131.7 kg)  ? ?  ? ?LABORATORY DATA:  ?I have reviewed the data as  listed ? ?  Latest Ref Rng & Units 07/24/2021  ?  7:53 AM 07/16/2021  ? 10:30 AM 07/01/2021  ?  9:52 AM  ?CMP  ?Glucose 70 - 99 mg/dL 97   88   96    ?BUN 6 - 20 mg/dL _0 ?Creatinine 0.44 - 1.00 mg/dL 0.90

## 2021-08-07 ENCOUNTER — Inpatient Hospital Stay: Payer: 59 | Admitting: Hematology and Oncology

## 2021-08-07 ENCOUNTER — Inpatient Hospital Stay: Payer: 59

## 2021-08-07 ENCOUNTER — Other Ambulatory Visit: Payer: 59

## 2021-08-07 ENCOUNTER — Other Ambulatory Visit: Payer: Self-pay

## 2021-08-07 ENCOUNTER — Ambulatory Visit: Payer: 59

## 2021-08-07 ENCOUNTER — Ambulatory Visit: Payer: 59 | Admitting: Hematology and Oncology

## 2021-08-07 VITALS — BP 175/86 | HR 79 | Temp 98.0°F | Resp 18 | Ht 66.0 in | Wt 290.3 lb

## 2021-08-07 DIAGNOSIS — C7800 Secondary malignant neoplasm of unspecified lung: Secondary | ICD-10-CM

## 2021-08-07 DIAGNOSIS — C787 Secondary malignant neoplasm of liver and intrahepatic bile duct: Secondary | ICD-10-CM | POA: Diagnosis not present

## 2021-08-07 DIAGNOSIS — C50211 Malignant neoplasm of upper-inner quadrant of right female breast: Secondary | ICD-10-CM

## 2021-08-07 DIAGNOSIS — Z170 Estrogen receptor positive status [ER+]: Secondary | ICD-10-CM

## 2021-08-07 DIAGNOSIS — Z923 Personal history of irradiation: Secondary | ICD-10-CM | POA: Diagnosis not present

## 2021-08-07 DIAGNOSIS — Z79899 Other long term (current) drug therapy: Secondary | ICD-10-CM | POA: Diagnosis not present

## 2021-08-07 DIAGNOSIS — Z5111 Encounter for antineoplastic chemotherapy: Secondary | ICD-10-CM | POA: Diagnosis not present

## 2021-08-07 DIAGNOSIS — R918 Other nonspecific abnormal finding of lung field: Secondary | ICD-10-CM | POA: Diagnosis not present

## 2021-08-07 DIAGNOSIS — Z95828 Presence of other vascular implants and grafts: Secondary | ICD-10-CM

## 2021-08-07 DIAGNOSIS — C7951 Secondary malignant neoplasm of bone: Secondary | ICD-10-CM | POA: Diagnosis not present

## 2021-08-07 DIAGNOSIS — C50111 Malignant neoplasm of central portion of right female breast: Secondary | ICD-10-CM

## 2021-08-07 LAB — CBC WITH DIFFERENTIAL (CANCER CENTER ONLY)
Abs Immature Granulocytes: 0.03 10*3/uL (ref 0.00–0.07)
Basophils Absolute: 0 10*3/uL (ref 0.0–0.1)
Basophils Relative: 1 %
Eosinophils Absolute: 0.1 10*3/uL (ref 0.0–0.5)
Eosinophils Relative: 4 %
HCT: 32.2 % — ABNORMAL LOW (ref 36.0–46.0)
Hemoglobin: 10.5 g/dL — ABNORMAL LOW (ref 12.0–15.0)
Immature Granulocytes: 1 %
Lymphocytes Relative: 17 %
Lymphs Abs: 0.7 10*3/uL (ref 0.7–4.0)
MCH: 28.7 pg (ref 26.0–34.0)
MCHC: 32.6 g/dL (ref 30.0–36.0)
MCV: 88 fL (ref 80.0–100.0)
Monocytes Absolute: 0.4 10*3/uL (ref 0.1–1.0)
Monocytes Relative: 10 %
Neutro Abs: 2.7 10*3/uL (ref 1.7–7.7)
Neutrophils Relative %: 67 %
Platelet Count: 239 10*3/uL (ref 150–400)
RBC: 3.66 MIL/uL — ABNORMAL LOW (ref 3.87–5.11)
RDW: 18.9 % — ABNORMAL HIGH (ref 11.5–15.5)
WBC Count: 3.9 10*3/uL — ABNORMAL LOW (ref 4.0–10.5)
nRBC: 0 % (ref 0.0–0.2)

## 2021-08-07 LAB — CMP (CANCER CENTER ONLY)
ALT: 32 U/L (ref 0–44)
AST: 27 U/L (ref 15–41)
Albumin: 3.3 g/dL — ABNORMAL LOW (ref 3.5–5.0)
Alkaline Phosphatase: 169 U/L — ABNORMAL HIGH (ref 38–126)
Anion gap: 6 (ref 5–15)
BUN: 13 mg/dL (ref 6–20)
CO2: 34 mmol/L — ABNORMAL HIGH (ref 22–32)
Calcium: 9.1 mg/dL (ref 8.9–10.3)
Chloride: 102 mmol/L (ref 98–111)
Creatinine: 0.71 mg/dL (ref 0.44–1.00)
GFR, Estimated: >60 mL/min (ref >60)
Glucose, Bld: 94 mg/dL (ref 70–99)
Potassium: 3 mmol/L — ABNORMAL LOW (ref 3.5–5.1)
Sodium: 142 mmol/L (ref 135–145)
Total Bilirubin: 0.3 mg/dL (ref 0.3–1.2)
Total Protein: 6.2 g/dL — ABNORMAL LOW (ref 6.5–8.1)

## 2021-08-07 MED ORDER — SODIUM CHLORIDE 0.9 % IV SOLN
6.0000 mg/kg | Freq: Once | INTRAVENOUS | Status: AC
  Administered 2021-08-07: 800 mg via INTRAVENOUS
  Filled 2021-08-07: qty 80

## 2021-08-07 MED ORDER — ACETAMINOPHEN 325 MG PO TABS
650.0000 mg | ORAL_TABLET | Freq: Once | ORAL | Status: AC
  Administered 2021-08-07: 650 mg via ORAL
  Filled 2021-08-07: qty 2

## 2021-08-07 MED ORDER — DIPHENHYDRAMINE HCL 50 MG/ML IJ SOLN
50.0000 mg | Freq: Once | INTRAMUSCULAR | Status: AC
  Administered 2021-08-07: 50 mg via INTRAVENOUS
  Filled 2021-08-07: qty 1

## 2021-08-07 MED ORDER — FAMOTIDINE IN NACL 20-0.9 MG/50ML-% IV SOLN
20.0000 mg | Freq: Once | INTRAVENOUS | Status: AC
  Administered 2021-08-07: 20 mg via INTRAVENOUS
  Filled 2021-08-07: qty 50

## 2021-08-07 MED ORDER — PREGABALIN 100 MG PO CAPS: 100.0000 mg | ORAL_CAPSULE | Freq: Three times a day (TID) | ORAL | 3 refills | Status: DC

## 2021-08-07 MED ORDER — SODIUM CHLORIDE 0.9 % IV SOLN
10.0000 mg | Freq: Once | INTRAVENOUS | Status: AC
  Administered 2021-08-07: 10 mg via INTRAVENOUS
  Filled 2021-08-07: qty 10

## 2021-08-07 MED ORDER — SODIUM CHLORIDE 0.9 % IV SOLN
150.0000 mg | Freq: Once | INTRAVENOUS | Status: AC
  Administered 2021-08-07: 150 mg via INTRAVENOUS
  Filled 2021-08-07: qty 150

## 2021-08-07 MED ORDER — PALONOSETRON HCL INJECTION 0.25 MG/5ML
0.2500 mg | Freq: Once | INTRAVENOUS | Status: AC
  Administered 2021-08-07: 0.25 mg via INTRAVENOUS
  Filled 2021-08-07: qty 5

## 2021-08-07 MED ORDER — SODIUM CHLORIDE 0.9% FLUSH
10.0000 mL | INTRAVENOUS | Status: DC | PRN
  Administered 2021-08-07: 10 mL

## 2021-08-07 MED ORDER — SODIUM CHLORIDE 0.9% FLUSH
10.0000 mL | INTRAVENOUS | Status: AC | PRN
  Administered 2021-08-07: 10 mL

## 2021-08-07 MED ORDER — ATROPINE SULFATE 1 MG/ML IV SOLN
0.5000 mg | Freq: Once | INTRAVENOUS | Status: AC | PRN
  Administered 2021-08-07: 0.5 mg via INTRAVENOUS
  Filled 2021-08-07: qty 1

## 2021-08-07 MED ORDER — HEPARIN SOD (PORK) LOCK FLUSH 100 UNIT/ML IV SOLN
500.0000 [IU] | Freq: Once | INTRAVENOUS | Status: AC | PRN
  Administered 2021-08-07: 500 [IU]

## 2021-08-07 MED ORDER — SODIUM CHLORIDE 0.9 % IV SOLN: Freq: Once | INTRAVENOUS | Status: AC

## 2021-08-07 NOTE — Progress Notes (Signed)
Patient was observed for 30 minutes post Trodelvy with no adverse reaction.  ?

## 2021-08-07 NOTE — Patient Instructions (Signed)
[facility]  Discharge Instructions: ?Thank you for choosing [facility] to provide your oncology and hematology care.  ? ?If you have a lab appointment with the [facility], please go directly to the [facility] and check in at the registration area. ?  ?Wear comfortable clothing and clothing appropriate for easy access to any Portacath or PICC line.  ? ?We strive to give you quality time with your provider. You may need to reschedule your appointment if you arrive late (15 or more minutes).  Arriving late affects you and other patients whose appointments are after yours.  Also, if you miss three or more appointments without notifying the office, you may be dismissed from the clinic at the provider?s discretion.    ?  ?For prescription refill requests, have your pharmacy contact our office and allow 72 hours for refills to be completed.   ? ?Today you received the following chemotherapy and/or immunotherapy agents: Debbie [name]    ?  ?To help prevent nausea and vomiting after your treatment, we encourage you to take your nausea medication as directed. ? ?BELOW ARE SYMPTOMS THAT SHOULD BE REPORTED IMMEDIATELY: ?*FEVER GREATER THAN 100.4 F (38 ?C) OR HIGHER ?*CHILLS OR SWEATING ?*NAUSEA AND VOMITING THAT IS NOT CONTROLLED WITH YOUR NAUSEA MEDICATION ?*UNUSUAL SHORTNESS OF BREATH ?*UNUSUAL BRUISING OR BLEEDING ?*URINARY PROBLEMS (pain or burning when urinating, or frequent urination) ?*BOWEL PROBLEMS (unusual diarrhea, constipation, pain near the anus) ?TENDERNESS IN MOUTH AND THROAT WITH OR WITHOUT PRESENCE OF ULCERS (sore throat, sores in mouth, or a toothache) ?UNUSUAL RASH, SWELLING OR PAIN  ?UNUSUAL VAGINAL DISCHARGE OR ITCHING  ? ?Items with * indicate a potential emergency and should be followed up as soon as possible or go to the Emergency Department if any problems should occur. ? ?Please show the CHEMOTHERAPY ALERT CARD or IMMUNOTHERAPY ALERT CARD at check-in to  the Emergency Department and triage nurse. ? ?Should you have questions after your visit or need to cancel or reschedule your appointment, please contact [facility]  Dept: [phone]  and follow the prompts.  Office hours are 8:00 a.m. to 4:30 p.m. Monday - Friday. Please note that voicemails left after 4:00 p.m. may not be returned until the following business day.  We are closed weekends and major holidays. You have access to a nurse at all times for urgent questions. Please call the main number to the clinic Dept: [phone] and follow the prompts. ? ? ?For any non-urgent questions, you may also contact your provider using MyChart. We now offer e-Visits for anyone 26 and older to request care online for non-urgent symptoms. For details visit [email or web address]. ?  ?Also download the MyChart app! Go to the app store, search "MyChart", open the app, select [facility], and log in with your MyChart username and password. ? ?Due to Covid, a mask is required upon entering the hospital/clinic. If you do not have a mask, one will be given to you upon arrival. For doctor visits, patients may have 1 support person aged 18 or older with them. For treatment visits, patients cannot have anyone with them due to current Covid guidelines and our immunocompromised population.  ? ?Sacituzumab Govitecan Injection ?What is this medication? ?SACITUZUMAB GOVITECAN (SAK i TOOZ ue mab GOE vi TEE kan) is a monoclonal antibody combined with chemotherapy. It is used to treat breast cancer and urothelial cancer. ?This medicine may be used for other purposes; ask your health care provider or  pharmacist if you have questions. ?COMMON BRAND NAME(S): TRODELVY ?What should I tell my care team before I take this medication? ?They need to know if you have any of these conditions: ?diarrhea ?infection (especially a virus infection such as chickenpox, cold sores, or herpes) ?liver disease ?low blood counts,  like low white cell, platelet, or red cell counts ?an unusual or allergic reaction to sacituzumab govitecan, other medicines, foods, dyes, or preservatives ?pregnant or trying to get pregnant ?breast-feeding ?How should I use this medication? ?This medicine is for infusion into a vein. It is given by a healthcare professional in a hospital or clinic setting. ?Talk to your pediatrician about the use of this medicine in children. Special care may be needed. ?Overdosage: If you think you have taken too much of this medicine contact a poison control center or emergency room at once. ?NOTE: This medicine is only for you. Do not share this medicine with others. ?What if I miss a dose? ?Keep appointments for follow-up doses. It is important not to miss your dose. Call your doctor or health care professional if you are unable to keep an appointment. ?What may interact with this medication? ?certain antivirals for HIV or hepatitis ?gemfibrozil ?This list may not describe all possible interactions. Give your health care provider a list of all the medicines, herbs, non-prescription drugs, or dietary supplements you use. Also tell them if you smoke, drink alcohol, or use illegal drugs. Some items may interact with your medicine. ?What should I watch for while using this medication? ?Your condition will be monitored carefully while you are receiving this medicine. ?You may need blood work done while you are taking this medicine. ?This medicine can cause serious allergic reactions. To reduce your risk, you may need to take medicine before treatment with this medicine. Take your medicine as directed. ?Do not become pregnant while taking this medicine or for 6 months after stopping it. Women should inform their health care professional if they wish to become pregnant or think they might be pregnant. Men should not father a child while taking this medicine and for 3 months after stopping it. There is potential for serious side  effects to an unborn child. Talk to your health care professional for more information. ?Do not breast-feed a child while taking this medicine or for 1 month after stopping it. ?This medicine may increase your risk of getting an infection. Call your health care professional for advice if you get a fever, chills, or sore throat, or other symptoms of a cold or flu. Do not treat yourself. Try to avoid being around people who are sick. ?Avoid taking medicines that contain aspirin, acetaminophen, ibuprofen, naproxen, or ketoprofen unless instructed by your health care professional. These medicines may hide a fever. ?This medicine has caused ovarian failure in some women. This medicine may make it more difficult to get pregnant. Talk to your health care professional if you are concerned about your fertility. ?What side effects may I notice from receiving this medication? ?Side effects that you should report to your doctor or health care professional as soon as possible: ?allergic reactions like skin rash, itching or hives; swelling of the face, lips, or tongue ?diarrhea ?nausea/vomiting ?signs and symptoms of infection like fever; chills; cough; sore throat; pain or trouble passing urine ?signs and symptoms of low red blood cells or anemia such as unusually weak or tired; feeling faint or lightheaded; falls; breathing problems ?Side effects that usually do not require medical attention (report these to  your doctor or health care professional if they continue or are bothersome): ?constipation ?hair loss ?headache ?loss of appetite ?signs and symptoms of high blood sugar such as being more thirsty or hungry or having to urinate more than normal. You may also feel very tired or have blurry vision. ?trouble sleeping ?This list may not describe all possible side effects. Call your doctor for medical advice about side effects. You may report side effects to FDA at 1-800-FDA-1088. ?Where should I keep my medication? ?This  medicine is given in a hospital or clinic and will not be stored at home. ?NOTE: This sheet is a summary. It may not cover all possible information. If you have questions about this medicine, talk to your doctor,

## 2021-08-08 ENCOUNTER — Other Ambulatory Visit (HOSPITAL_COMMUNITY): Payer: Self-pay

## 2021-08-08 ENCOUNTER — Telehealth: Payer: Self-pay | Admitting: Hematology and Oncology

## 2021-08-08 ENCOUNTER — Encounter: Payer: Self-pay | Admitting: Adult Health

## 2021-08-08 LAB — CANCER ANTIGEN 27.29: CA 27.29: 426.6 U/mL — ABNORMAL HIGH (ref 0.0–38.6)

## 2021-08-08 NOTE — Telephone Encounter (Signed)
Scheduled appointment per 3/30 los. Patient is aware of the upcoming appointments. (4/6 4/20 4/27 5/11 and 5/18) ?

## 2021-08-09 ENCOUNTER — Other Ambulatory Visit (HOSPITAL_COMMUNITY): Payer: Self-pay

## 2021-08-10 ENCOUNTER — Encounter: Payer: Self-pay | Admitting: Hematology and Oncology

## 2021-08-10 ENCOUNTER — Other Ambulatory Visit: Payer: Self-pay | Admitting: Hematology and Oncology

## 2021-08-10 ENCOUNTER — Other Ambulatory Visit (HOSPITAL_COMMUNITY): Payer: Self-pay

## 2021-08-11 ENCOUNTER — Other Ambulatory Visit: Payer: Self-pay | Admitting: *Deleted

## 2021-08-11 ENCOUNTER — Other Ambulatory Visit (HOSPITAL_COMMUNITY): Payer: Self-pay

## 2021-08-11 ENCOUNTER — Encounter: Payer: Self-pay | Admitting: Hematology and Oncology

## 2021-08-11 MED ORDER — LEVOTHYROXINE SODIUM 175 MCG PO TABS
ORAL_TABLET | ORAL | 6 refills | Status: DC
  Filled 2021-08-11: qty 30, 30d supply, fill #0
  Filled 2021-09-09: qty 30, 30d supply, fill #1
  Filled 2021-10-16: qty 30, 30d supply, fill #2
  Filled 2021-11-13: qty 30, 30d supply, fill #3
  Filled 2021-12-10: qty 18, 18d supply, fill #4
  Filled 2021-12-18: qty 12, 12d supply, fill #4
  Filled 2022-01-21: qty 30, 30d supply, fill #5
  Filled 2022-02-17: qty 30, 30d supply, fill #6

## 2021-08-11 MED ORDER — PREGABALIN 100 MG PO CAPS
100.0000 mg | ORAL_CAPSULE | Freq: Three times a day (TID) | ORAL | 3 refills | Status: DC
  Filled 2021-08-11: qty 90, 30d supply, fill #0
  Filled 2021-09-27: qty 90, 30d supply, fill #1
  Filled 2021-10-30: qty 90, 30d supply, fill #2
  Filled 2021-12-02: qty 90, 30d supply, fill #3

## 2021-08-12 ENCOUNTER — Other Ambulatory Visit (HOSPITAL_COMMUNITY): Payer: Self-pay

## 2021-08-12 ENCOUNTER — Ambulatory Visit: Payer: 59

## 2021-08-12 ENCOUNTER — Other Ambulatory Visit: Payer: 59

## 2021-08-13 ENCOUNTER — Other Ambulatory Visit (HOSPITAL_COMMUNITY): Payer: Self-pay

## 2021-08-14 ENCOUNTER — Other Ambulatory Visit: Payer: 59

## 2021-08-14 ENCOUNTER — Inpatient Hospital Stay: Payer: 59 | Attending: Oncology

## 2021-08-14 ENCOUNTER — Other Ambulatory Visit: Payer: Self-pay

## 2021-08-14 ENCOUNTER — Ambulatory Visit: Payer: 59

## 2021-08-14 ENCOUNTER — Inpatient Hospital Stay: Payer: 59

## 2021-08-14 ENCOUNTER — Other Ambulatory Visit: Payer: Self-pay | Admitting: Hematology and Oncology

## 2021-08-14 VITALS — BP 146/80 | HR 93 | Temp 98.0°F | Resp 16 | Wt 289.0 lb

## 2021-08-14 DIAGNOSIS — Z5111 Encounter for antineoplastic chemotherapy: Secondary | ICD-10-CM | POA: Diagnosis not present

## 2021-08-14 DIAGNOSIS — C50211 Malignant neoplasm of upper-inner quadrant of right female breast: Secondary | ICD-10-CM | POA: Insufficient documentation

## 2021-08-14 DIAGNOSIS — Z170 Estrogen receptor positive status [ER+]: Secondary | ICD-10-CM

## 2021-08-14 DIAGNOSIS — Z79899 Other long term (current) drug therapy: Secondary | ICD-10-CM | POA: Insufficient documentation

## 2021-08-14 DIAGNOSIS — R918 Other nonspecific abnormal finding of lung field: Secondary | ICD-10-CM | POA: Insufficient documentation

## 2021-08-14 DIAGNOSIS — Z923 Personal history of irradiation: Secondary | ICD-10-CM | POA: Diagnosis not present

## 2021-08-14 DIAGNOSIS — C787 Secondary malignant neoplasm of liver and intrahepatic bile duct: Secondary | ICD-10-CM | POA: Insufficient documentation

## 2021-08-14 DIAGNOSIS — C7800 Secondary malignant neoplasm of unspecified lung: Secondary | ICD-10-CM

## 2021-08-14 DIAGNOSIS — C7951 Secondary malignant neoplasm of bone: Secondary | ICD-10-CM | POA: Diagnosis not present

## 2021-08-14 DIAGNOSIS — Z95828 Presence of other vascular implants and grafts: Secondary | ICD-10-CM

## 2021-08-14 LAB — CMP (CANCER CENTER ONLY)
ALT: 25 U/L (ref 0–44)
AST: 22 U/L (ref 15–41)
Albumin: 3.4 g/dL — ABNORMAL LOW (ref 3.5–5.0)
Alkaline Phosphatase: 148 U/L — ABNORMAL HIGH (ref 38–126)
Anion gap: 6 (ref 5–15)
BUN: 14 mg/dL (ref 6–20)
CO2: 32 mmol/L (ref 22–32)
Calcium: 8.4 mg/dL — ABNORMAL LOW (ref 8.9–10.3)
Chloride: 103 mmol/L (ref 98–111)
Creatinine: 0.9 mg/dL (ref 0.44–1.00)
GFR, Estimated: >60 mL/min (ref >60)
Glucose, Bld: 92 mg/dL (ref 70–99)
Potassium: 3.4 mmol/L — ABNORMAL LOW (ref 3.5–5.1)
Sodium: 141 mmol/L (ref 135–145)
Total Bilirubin: 0.3 mg/dL (ref 0.3–1.2)
Total Protein: 6.6 g/dL (ref 6.5–8.1)

## 2021-08-14 LAB — CBC WITH DIFFERENTIAL (CANCER CENTER ONLY)
Abs Immature Granulocytes: 0.08 10*3/uL — ABNORMAL HIGH (ref 0.00–0.07)
Basophils Absolute: 0.1 10*3/uL (ref 0.0–0.1)
Basophils Relative: 2 %
Eosinophils Absolute: 0.1 10*3/uL (ref 0.0–0.5)
Eosinophils Relative: 3 %
HCT: 32.5 % — ABNORMAL LOW (ref 36.0–46.0)
Hemoglobin: 10.5 g/dL — ABNORMAL LOW (ref 12.0–15.0)
Immature Granulocytes: 2 %
Lymphocytes Relative: 18 %
Lymphs Abs: 0.6 10*3/uL — ABNORMAL LOW (ref 0.7–4.0)
MCH: 28.9 pg (ref 26.0–34.0)
MCHC: 32.3 g/dL (ref 30.0–36.0)
MCV: 89.5 fL (ref 80.0–100.0)
Monocytes Absolute: 0.5 10*3/uL (ref 0.1–1.0)
Monocytes Relative: 15 %
Neutro Abs: 2.1 10*3/uL (ref 1.7–7.7)
Neutrophils Relative %: 60 %
Platelet Count: 228 10*3/uL (ref 150–400)
RBC: 3.63 MIL/uL — ABNORMAL LOW (ref 3.87–5.11)
RDW: 19.8 % — ABNORMAL HIGH (ref 11.5–15.5)
WBC Count: 3.5 10*3/uL — ABNORMAL LOW (ref 4.0–10.5)
nRBC: 0.6 % — ABNORMAL HIGH (ref 0.0–0.2)

## 2021-08-14 MED ORDER — SODIUM CHLORIDE 0.9 % IV SOLN: Freq: Once | INTRAVENOUS | Status: AC

## 2021-08-14 MED ORDER — SODIUM CHLORIDE 0.9 % IV SOLN
10.0000 mg | Freq: Once | INTRAVENOUS | Status: AC
  Administered 2021-08-14: 10 mg via INTRAVENOUS
  Filled 2021-08-14: qty 10

## 2021-08-14 MED ORDER — PALONOSETRON HCL INJECTION 0.25 MG/5ML
0.2500 mg | Freq: Once | INTRAVENOUS | Status: AC
  Administered 2021-08-14: 0.25 mg via INTRAVENOUS
  Filled 2021-08-14: qty 5

## 2021-08-14 MED ORDER — FAMOTIDINE IN NACL 20-0.9 MG/50ML-% IV SOLN
20.0000 mg | Freq: Once | INTRAVENOUS | Status: AC
  Administered 2021-08-14: 20 mg via INTRAVENOUS
  Filled 2021-08-14: qty 50

## 2021-08-14 MED ORDER — ATROPINE SULFATE 1 MG/ML IV SOLN
0.5000 mg | Freq: Once | INTRAVENOUS | Status: AC | PRN
  Administered 2021-08-14: 0.5 mg via INTRAVENOUS
  Filled 2021-08-14: qty 1

## 2021-08-14 MED ORDER — HEPARIN SOD (PORK) LOCK FLUSH 100 UNIT/ML IV SOLN
500.0000 [IU] | Freq: Once | INTRAVENOUS | Status: AC | PRN
  Administered 2021-08-14: 500 [IU]

## 2021-08-14 MED ORDER — SODIUM CHLORIDE 0.9 % IV SOLN
150.0000 mg | Freq: Once | INTRAVENOUS | Status: AC
  Administered 2021-08-14: 150 mg via INTRAVENOUS
  Filled 2021-08-14: qty 150

## 2021-08-14 MED ORDER — SODIUM CHLORIDE 0.9% FLUSH
10.0000 mL | INTRAVENOUS | Status: AC | PRN
  Administered 2021-08-14: 10 mL

## 2021-08-14 MED ORDER — DIPHENHYDRAMINE HCL 50 MG/ML IJ SOLN
50.0000 mg | Freq: Once | INTRAMUSCULAR | Status: AC
  Administered 2021-08-14: 50 mg via INTRAVENOUS
  Filled 2021-08-14: qty 1

## 2021-08-14 MED ORDER — SODIUM CHLORIDE 0.9 % IV SOLN
6.0000 mg/kg | Freq: Once | INTRAVENOUS | Status: AC
  Administered 2021-08-14: 800 mg via INTRAVENOUS
  Filled 2021-08-14: qty 80

## 2021-08-14 MED ORDER — ACETAMINOPHEN 325 MG PO TABS
650.0000 mg | ORAL_TABLET | Freq: Once | ORAL | Status: AC
  Administered 2021-08-14: 650 mg via ORAL
  Filled 2021-08-14: qty 2

## 2021-08-14 MED ORDER — SODIUM CHLORIDE 0.9% FLUSH
10.0000 mL | INTRAVENOUS | Status: DC | PRN
  Administered 2021-08-14: 10 mL

## 2021-08-14 NOTE — Progress Notes (Signed)
? ?Patient Care Team: ?Kelton Pillar, MD as PCP - General (Family Medicine) ?Jacelyn Pi, MD as Consulting Physician (Endocrinology) ?Jovita Kussmaul, MD as Consulting Physician (General Surgery) ?[name], MD as Attending Physician (Radiation Oncology) ?Belva Crome, MD as Consulting Physician (Cardiology) ?Gardenia Phlegm, NP as Nurse Practitioner (Hematology and Oncology) ?Lajoyce Lauber, DMD (Dentistry) ?Raina Mina, RPH-CPP (Pharmacist) ?Nicholas Lose, MD as Consulting Physician (Hematology and Oncology) ? ?DIAGNOSIS:  ?Encounter Diagnosis  ?Name Primary?  ? Malignant neoplasm of upper-inner quadrant of right breast in female, estrogen receptor positive ([place])   ? ? ?SUMMARY OF ONCOLOGIC HISTORY: ?Oncology History  ?Malignant neoplasm of upper-inner quadrant of right breast in female, estrogen receptor positive ([place])  ?10/15/2015 Initial Biopsy  ? Right breast upper inner quadrant biopsy: IDC, DCIS, grade 2, ER+(90%), PR+(50%), Ki67 70%, HER-2 negative (ratio 1.18). ? ?  ?10/17/2015 Initial Diagnosis  ? Malignant neoplasm of upper-inner quadrant of right breast in female, estrogen receptor positive ([place]) ? ?  ?10/2015 -  Anti-estrogen oral therapy  ? Tamoxifen daily ? ?  ?11/28/2015 Surgery  ? Left breast mammoplasty: IDC, grade 1, 1.2cm, DCIS, lobular neoplasia, T1c, Nx, ER 80%, PR60%, Ki67 3% HER-2 neg ?Right breast lumpectomy: IDC, grade 1, 2.3cm, DCIS. T2N1a ER/PR+HER-2 neg, Ki 70%. ? ?  ?11/28/2015 Miscellaneous  ? Mammaprint was Low Risk. Predicting a 98% chance of no disease recurrence within 5 years with anti-estrogens as the only systemic therapy.  It also predicts minimal to no benefit from chemotherapy.   ? ?  ?12/26/2015 Surgery  ? Bilateral mastectomies: bilateral sentinel node sampling, Right: no residual carcinoma, left, focal DCIS, 4 SLN negative ? ?  ?02/11/2016 - 03/27/2016 Radiation Therapy  ? Adjuvant radiation [name]): 1) Right chest wall: 50.4 Gy in 28 fractions  2)  Right chest wall boost: 10 Gy in 5 fractions ? ?  ?08/04/2017 Relapse/Recurrence  ? 08/04/2017, MRI of the liver and total spine MRI 08/11/2017 showed liver and bone metastasis; liver biopsy 09/07/2017 confirms estrogen receptor positive, progesterone receptor negative, HER-2 negative metastatic breast cancer; chest CT scan 08/13/2017 shows multiple bilateral pulmonary nodules. ?  ?08/17/2017 - 01/04/2018 Anti-estrogen oral therapy  ? fulvestrant  palbociclib started 08/31/2017 discontinued 01/04/2018 with evidence of progression in the liver ?  ?08/2017 Miscellaneous  ? [facility] report on liver biopsy from April 2019 shows a negative PDL 1, stable MSI and proficient mismatch repair status.  The tumor is positive for the androgen receptor, and for PTEN for PIK3 CA mutations.  HER-2/neu was read as equivocal (it was negative by FISH on the pathology report). ?  ?01/04/2018 Miscellaneous  ? anastrozole started 01/04/2018 ?everolimus started 01/29/2018 ?  ?02/16/2018 - 03/01/2018 Radiation Therapy  ? 01/28/18 prophylactic left femur intramedullary nail surgery for impending left femur pathologic fracture ?alliative radiation with Dr. [name] to the left femur and left ilium from 02/16/2018 to 03/01/2018 ?  ?01/30/2020 - 05/2020 Anti-estrogen oral therapy  ? alpelisib started 01/30/2020 at 300 mg daily with Metformin 500 mg daily and Faslodex (stopped for progression) ?  ?05/22/2020 - 08/2020 Chemotherapy  ? Xeloda discontinued for progression ?  ?09/10/2020 - 06/18/2021 Chemotherapy  ? Patient is on Treatment Plan : BREAST Paclitaxel D1,D15 q28d  ? ?  ?  ?07/17/2021 -  Chemotherapy  ? Patient is on Treatment Plan : BREAST METASTATIC Sacituzumab govitecan-hziy [name]) q21d  ? ?  ?  ?Bilateral malignant neoplasm of breast in female, estrogen receptor positive ([place])  ?12/26/2015 Initial Diagnosis  ?  Bilateral malignant neoplasm of breast in female, estrogen receptor positive ([place]) ? ?  ?07/17/2021 -  Chemotherapy  ? Patient is on Treatment  Plan : BREAST METASTATIC Sacituzumab govitecan-hziy [name]) q21d  ? ?  ?  ?Malignant neoplasm metastatic to liver [facility])  ?08/13/2017 Initial Diagnosis  ? Liver metastases ([place]) ? ?  ?07/17/2021 -  Chemotherapy  ? Patient is on Treatment Plan : BREAST METASTATIC Sacituzumab govitecan-hziy [name]) q21d  ? ?  ?  ?Malignant neoplasm metastatic to lung [facility])  ?09/10/2020 Initial Diagnosis  ? Lung metastases ([place]) ? ?  ?07/17/2021 -  Chemotherapy  ? Patient is on Treatment Plan : BREAST METASTATIC Sacituzumab govitecan-hziy [name]) q21d  ? ?  ?  ? ? ?CHIEF COMPLIANT: Cycle 3 Trodelvy ? ?INTERVAL HISTORY: Debbie [name] is a 56 year old with above-mentioned history of metastatic breast cancer. She presents to the clinic for a follow-up.  She state that she did tolerate the [place], she had some diarrhea and mild nausea some constipation only last a couple days. ? ?ALLERGIES:  is allergic to ace inhibitors, atorvastatin, simvastatin, and percocet [oxycodone-acetaminophen]. ? ?MEDICATIONS:  ?Current Outpatient Medications  ?Medication Sig Dispense Refill  ? acetaminophen (TYLENOL) 500 MG tablet Take 1,000 mg by mouth every 6 (six) hours as needed.    ? Calcium-Magnesium 250-125 MG TABS Take 1 tablet by mouth daily. 120 each 4  ? cetirizine (ZYRTEC) 10 MG tablet Take 10 mg by mouth daily as needed for allergies.     ? chlorhexidine (PERIDEX) 0.12 % solution Use 5 mLs in the mouth or throat 2 (two) times daily as needed as directed. Do not swallow 473 mL 6  ? diphenoxylate-atropine (LOMOTIL) 2.5-0.025 MG tablet Take 1 tablet by mouth 4 (four) times daily as needed for diarrhea or loose stools. 30 tablet 2  ? doxycycline (VIBRA-TABS) 100 MG tablet Take 1 tablet (100 mg total) by mouth daily. 30 tablet 2  ? furosemide (LASIX) 20 MG tablet Take 1 tablet (20 mg total) by mouth daily. 90 tablet 6  ? levothyroxine (SYNTHROID) 175 MCG tablet Take 1 tablet (175 mcg total) by mouth in the morning on an empty stomach 30 tablet 6   ? losartan-hydrochlorothiazide (HYZAAR) 100-25 MG tablet Take 1 tablet by mouth once a day 90 tablet 2  ? Multiple Vitamin (MULTIVITAMIN) tablet Take 1 tablet by mouth daily.    ? omega-3 acid ethyl esters (LOVAZA) 1 g capsule Take 1 g by mouth 2 (two) times daily.     ? omeprazole (PRILOSEC) 40 MG capsule Take 1 capsule (40 mg total) by mouth daily. 30 capsule 3  ? ondansetron (ZOFRAN-ODT) 8 MG disintegrating tablet Dissolve 1 tablet (8 mg total) by mouth every 8 (eight) hours as needed for nausea or vomiting. 20 tablet 0  ? oxyCODONE (OXY IR/ROXICODONE) 5 MG immediate release tablet Take 1 tablet (5 mg total) by mouth every 4 (four) hours as needed for severe pain. 60 tablet 0  ? potassium chloride SA (KLOR-CON) 20 MEQ tablet Take 2 tablets (40 mEq total) by mouth daily. 90 tablet 4  ? pregabalin (LYRICA) 100 MG capsule Take 1 capsule by mouth 3 times daily. 90 capsule 3  ? rosuvastatin (CRESTOR) 5 MG tablet Take 1 tablet (5 mg total) by mouth daily. 90 tablet 1  ? sertraline (ZOLOFT) 100 MG tablet TAKE 1 TABLET BY MOUTH ONCE DAILY 30 tablet 12  ? valACYclovir (VALTREX) 1000 MG tablet Take 1 tablet (1,000 mg total) by mouth daily. 60 tablet  6  ? Vitamin D, Cholecalciferol, 1000 units CAPS Take 1 tablet by mouth daily. (Patient taking differently: Take 1,000 Units by mouth daily.) 90 capsule 4  ? ?No current facility-administered medications for this visit.  ? ?Facility-Administered Medications Ordered in Other Visits  ?Medication Dose Route Frequency Provider Last Rate Last Admin  ? heparin lock flush 100 unit/mL  500 Units Intracatheter Once Nicholas Lose, MD      ? ? ?PHYSICAL EXAMINATION: ?ECOG PERFORMANCE STATUS: 1 - Symptomatic but completely ambulatory ? ?Vitals:  ? 08/28/21 0806  ?BP: (!) 166/81  ?Pulse: 91  ?Resp: 17  ?Temp: (!) 97 ?F (36.1 ?C)  ?SpO2: 97%  ? ?Filed Weights  ? 08/28/21 0806  ?Weight: 289 lb 8 oz (131.3 kg)  ?  ? ?LABORATORY DATA:  ?I have reviewed the data as listed ? ?  Latest Ref Rng &  Units 08/14/2021  ?  8:12 AM 08/07/2021  ?  8:41 AM 07/24/2021  ?  7:53 AM  ?CMP  ?Glucose 70 - 99 mg/dL 92   94   97    ?BUN 6 - 20 mg/dL '14   13   17    ' ?Creatinine 0.44 - 1.00 mg/dL 0.90   0.71   0.90    ?

## 2021-08-14 NOTE — Patient Instructions (Signed)
[facility]  Discharge Instructions: ?Thank you for choosing [facility] to provide your oncology and hematology care.  ? ?If you have a lab appointment with the [facility], please go directly to the [facility] and check in at the registration area. ?  ?Wear comfortable clothing and clothing appropriate for easy access to any Portacath or PICC line.  ? ?We strive to give you quality time with your provider. You may need to reschedule your appointment if you arrive late (15 or more minutes).  Arriving late affects you and other patients whose appointments are after yours.  Also, if you miss three or more appointments without notifying the office, you may be dismissed from the clinic at the provider?s discretion.    ?  ?For prescription refill requests, have your pharmacy contact our office and allow 72 hours for refills to be completed.   ? ?Today you received the following chemotherapy and/or immunotherapy agents: [name]    ?  ?To help prevent nausea and vomiting after your treatment, we encourage you to take your nausea medication as directed. ? ?BELOW ARE SYMPTOMS THAT SHOULD BE REPORTED IMMEDIATELY: ?*FEVER GREATER THAN 100.4 F (38 ?C) OR HIGHER ?*CHILLS OR SWEATING ?*NAUSEA AND VOMITING THAT IS NOT CONTROLLED WITH YOUR NAUSEA MEDICATION ?*UNUSUAL SHORTNESS OF BREATH ?*UNUSUAL BRUISING OR BLEEDING ?*URINARY PROBLEMS (pain or burning when urinating, or frequent urination) ?*BOWEL PROBLEMS (unusual diarrhea, constipation, pain near the anus) ?TENDERNESS IN MOUTH AND THROAT WITH OR WITHOUT PRESENCE OF ULCERS (sore throat, sores in mouth, or a toothache) ?UNUSUAL RASH, SWELLING OR PAIN  ?UNUSUAL VAGINAL DISCHARGE OR ITCHING  ? ?Items with * indicate a potential emergency and should be followed up as soon as possible or go to the Emergency Department if any problems should occur. ? ?Please show the CHEMOTHERAPY ALERT CARD or IMMUNOTHERAPY ALERT CARD at check-in to  the Emergency Department and triage nurse. ? ?Should you have questions after your visit or need to cancel or reschedule your appointment, please contact [facility]  Dept: [phone]  and follow the prompts.  Office hours are 8:00 a.m. to 4:30 p.m. Monday - Friday. Please note that voicemails left after 4:00 p.m. may not be returned until the following business day.  We are closed weekends and major holidays. You have access to a nurse at all times for urgent questions. Please call the main number to the clinic Dept: [phone] and follow the prompts. ? ? ?For any non-urgent questions, you may also contact your provider using MyChart. We now offer e-Visits for anyone 16 and older to request care online for non-urgent symptoms. For details visit [email or web address]. ?  ?Also download the MyChart app! Go to the app store, search "MyChart", open the app, select [facility], and log in with your MyChart username and password. ? ?Due to Covid, a mask is required upon entering the hospital/clinic. If you do not have a mask, one will be given to you upon arrival. For doctor visits, patients may have 1 support person aged 13 or older with them. For treatment visits, patients cannot have anyone with them due to current Covid guidelines and our immunocompromised population.  ? ?Sacituzumab Govitecan Injection ?What is this medication? ?SACITUZUMAB GOVITECAN (SAK i TOOZ ue mab GOE vi TEE kan) is a monoclonal antibody combined with chemotherapy. It is used to treat breast cancer and urothelial cancer. ?This medicine may be used for other purposes; ask your health care provider or  pharmacist if you have questions. ?COMMON BRAND NAME(S): TRODELVY ?What should I tell my care team before I take this medication? ?They need to know if you have any of these conditions: ?diarrhea ?infection (especially a virus infection such as chickenpox, cold sores, or herpes) ?liver disease ?low blood counts,  like low white cell, platelet, or red cell counts ?an unusual or allergic reaction to sacituzumab govitecan, other medicines, foods, dyes, or preservatives ?pregnant or trying to get pregnant ?breast-feeding ?How should I use this medication? ?This medicine is for infusion into a vein. It is given by a healthcare professional in a hospital or clinic setting. ?Talk to your pediatrician about the use of this medicine in children. Special care may be needed. ?Overdosage: If you think you have taken too much of this medicine contact a poison control center or emergency room at once. ?NOTE: This medicine is only for you. Do not share this medicine with others. ?What if I miss a dose? ?Keep appointments for follow-up doses. It is important not to miss your dose. Call your doctor or health care professional if you are unable to keep an appointment. ?What may interact with this medication? ?certain antivirals for HIV or hepatitis ?gemfibrozil ?This list may not describe all possible interactions. Give your health care provider a list of all the medicines, herbs, non-prescription drugs, or dietary supplements you use. Also tell them if you smoke, drink alcohol, or use illegal drugs. Some items may interact with your medicine. ?What should I watch for while using this medication? ?Your condition will be monitored carefully while you are receiving this medicine. ?You may need blood work done while you are taking this medicine. ?This medicine can cause serious allergic reactions. To reduce your risk, you may need to take medicine before treatment with this medicine. Take your medicine as directed. ?Do not become pregnant while taking this medicine or for 6 months after stopping it. Women should inform their health care professional if they wish to become pregnant or think they might be pregnant. Men should not father a child while taking this medicine and for 3 months after stopping it. There is potential for serious side  effects to an unborn child. Talk to your health care professional for more information. ?Do not breast-feed a child while taking this medicine or for 1 month after stopping it. ?This medicine may increase your risk of getting an infection. Call your health care professional for advice if you get a fever, chills, or sore throat, or other symptoms of a cold or flu. Do not treat yourself. Try to avoid being around people who are sick. ?Avoid taking medicines that contain aspirin, acetaminophen, ibuprofen, naproxen, or ketoprofen unless instructed by your health care professional. These medicines may hide a fever. ?This medicine has caused ovarian failure in some women. This medicine may make it more difficult to get pregnant. Talk to your health care professional if you are concerned about your fertility. ?What side effects may I notice from receiving this medication? ?Side effects that you should report to your doctor or health care professional as soon as possible: ?allergic reactions like skin rash, itching or hives; swelling of the face, lips, or tongue ?diarrhea ?nausea/vomiting ?signs and symptoms of infection like fever; chills; cough; sore throat; pain or trouble passing urine ?signs and symptoms of low red blood cells or anemia such as unusually weak or tired; feeling faint or lightheaded; falls; breathing problems ?Side effects that usually do not require medical attention (report these to  your doctor or health care professional if they continue or are bothersome): ?constipation ?hair loss ?headache ?loss of appetite ?signs and symptoms of high blood sugar such as being more thirsty or hungry or having to urinate more than normal. You may also feel very tired or have blurry vision. ?trouble sleeping ?This list may not describe all possible side effects. Call your doctor for medical advice about side effects. You may report side effects to FDA at 1-800-FDA-1088. ?Where should I keep my medication? ?This  medicine is given in a hospital or clinic and will not be stored at home. ?NOTE: This sheet is a summary. It may not cover all possible information. If you have questions about this medicine, talk to your doctor,

## 2021-08-15 ENCOUNTER — Other Ambulatory Visit (HOSPITAL_COMMUNITY): Payer: Self-pay

## 2021-08-15 LAB — CANCER ANTIGEN 27.29: CA 27.29: 440.9 U/mL — ABNORMAL HIGH (ref 0.0–38.6)

## 2021-08-21 ENCOUNTER — Encounter: Payer: Self-pay | Admitting: Hematology and Oncology

## 2021-08-27 ENCOUNTER — Encounter: Payer: Self-pay | Admitting: Hematology and Oncology

## 2021-08-27 DIAGNOSIS — Z95828 Presence of other vascular implants and grafts: Secondary | ICD-10-CM | POA: Insufficient documentation

## 2021-08-28 ENCOUNTER — Ambulatory Visit: Payer: 59 | Admitting: Hematology and Oncology

## 2021-08-28 ENCOUNTER — Inpatient Hospital Stay: Payer: 59

## 2021-08-28 ENCOUNTER — Inpatient Hospital Stay: Payer: 59 | Admitting: Hematology and Oncology

## 2021-08-28 ENCOUNTER — Ambulatory Visit: Payer: 59

## 2021-08-28 ENCOUNTER — Other Ambulatory Visit: Payer: 59

## 2021-08-28 ENCOUNTER — Other Ambulatory Visit (HOSPITAL_COMMUNITY): Payer: Self-pay

## 2021-08-28 VITALS — BP 115/74 | HR 83 | Temp 98.9°F | Resp 18

## 2021-08-28 DIAGNOSIS — C7951 Secondary malignant neoplasm of bone: Secondary | ICD-10-CM | POA: Diagnosis not present

## 2021-08-28 DIAGNOSIS — C7800 Secondary malignant neoplasm of unspecified lung: Secondary | ICD-10-CM

## 2021-08-28 DIAGNOSIS — Z170 Estrogen receptor positive status [ER+]: Secondary | ICD-10-CM

## 2021-08-28 DIAGNOSIS — Z95828 Presence of other vascular implants and grafts: Secondary | ICD-10-CM

## 2021-08-28 DIAGNOSIS — C50211 Malignant neoplasm of upper-inner quadrant of right female breast: Secondary | ICD-10-CM

## 2021-08-28 DIAGNOSIS — R918 Other nonspecific abnormal finding of lung field: Secondary | ICD-10-CM | POA: Diagnosis not present

## 2021-08-28 DIAGNOSIS — C787 Secondary malignant neoplasm of liver and intrahepatic bile duct: Secondary | ICD-10-CM

## 2021-08-28 DIAGNOSIS — Z923 Personal history of irradiation: Secondary | ICD-10-CM | POA: Diagnosis not present

## 2021-08-28 DIAGNOSIS — Z79899 Other long term (current) drug therapy: Secondary | ICD-10-CM | POA: Diagnosis not present

## 2021-08-28 DIAGNOSIS — Z5111 Encounter for antineoplastic chemotherapy: Secondary | ICD-10-CM | POA: Diagnosis not present

## 2021-08-28 LAB — CMP (CANCER CENTER ONLY)
ALT: 47 U/L — ABNORMAL HIGH (ref 0–44)
AST: 34 U/L (ref 15–41)
Albumin: 3.5 g/dL (ref 3.5–5.0)
Alkaline Phosphatase: 185 U/L — ABNORMAL HIGH (ref 38–126)
Anion gap: 6 (ref 5–15)
BUN: 13 mg/dL (ref 6–20)
CO2: 32 mmol/L (ref 22–32)
Calcium: 9 mg/dL (ref 8.9–10.3)
Chloride: 103 mmol/L (ref 98–111)
Creatinine: 0.77 mg/dL (ref 0.44–1.00)
GFR, Estimated: >60 mL/min (ref >60)
Glucose, Bld: 102 mg/dL — ABNORMAL HIGH (ref 70–99)
Potassium: 3.4 mmol/L — ABNORMAL LOW (ref 3.5–5.1)
Sodium: 141 mmol/L (ref 135–145)
Total Bilirubin: 0.3 mg/dL (ref 0.3–1.2)
Total Protein: 6.6 g/dL (ref 6.5–8.1)

## 2021-08-28 LAB — CBC WITH DIFFERENTIAL (CANCER CENTER ONLY)
Abs Immature Granulocytes: 0.02 10*3/uL (ref 0.00–0.07)
Basophils Absolute: 0 10*3/uL (ref 0.0–0.1)
Basophils Relative: 1 %
Eosinophils Absolute: 0.2 10*3/uL (ref 0.0–0.5)
Eosinophils Relative: 3 %
HCT: 32.8 % — ABNORMAL LOW (ref 36.0–46.0)
Hemoglobin: 10.9 g/dL — ABNORMAL LOW (ref 12.0–15.0)
Immature Granulocytes: 0 %
Lymphocytes Relative: 15 %
Lymphs Abs: 0.7 10*3/uL (ref 0.7–4.0)
MCH: 29.7 pg (ref 26.0–34.0)
MCHC: 33.2 g/dL (ref 30.0–36.0)
MCV: 89.4 fL (ref 80.0–100.0)
Monocytes Absolute: 0.5 10*3/uL (ref 0.1–1.0)
Monocytes Relative: 10 %
Neutro Abs: 3.3 10*3/uL (ref 1.7–7.7)
Neutrophils Relative %: 71 %
Platelet Count: 272 10*3/uL (ref 150–400)
RBC: 3.67 MIL/uL — ABNORMAL LOW (ref 3.87–5.11)
RDW: 20.8 % — ABNORMAL HIGH (ref 11.5–15.5)
WBC Count: 4.7 10*3/uL (ref 4.0–10.5)
nRBC: 0 % (ref 0.0–0.2)

## 2021-08-28 MED ORDER — SODIUM CHLORIDE 0.9 % IV SOLN
10.0000 mg | Freq: Once | INTRAVENOUS | Status: AC
  Administered 2021-08-28: 10 mg via INTRAVENOUS
  Filled 2021-08-28: qty 10

## 2021-08-28 MED ORDER — ACETAMINOPHEN 325 MG PO TABS
650.0000 mg | ORAL_TABLET | Freq: Once | ORAL | Status: AC
  Administered 2021-08-28: 650 mg via ORAL
  Filled 2021-08-28: qty 2

## 2021-08-28 MED ORDER — SODIUM CHLORIDE 0.9 % IV SOLN: Freq: Once | INTRAVENOUS | Status: AC

## 2021-08-28 MED ORDER — ATROPINE SULFATE 1 MG/ML IV SOLN
0.5000 mg | Freq: Once | INTRAVENOUS | Status: AC | PRN
  Administered 2021-08-28: 0.5 mg via INTRAVENOUS
  Filled 2021-08-28: qty 1

## 2021-08-28 MED ORDER — SODIUM CHLORIDE 0.9% FLUSH
10.0000 mL | Freq: Once | INTRAVENOUS | Status: AC
  Administered 2021-08-28: 10 mL

## 2021-08-28 MED ORDER — DENOSUMAB 120 MG/1.7ML ~~LOC~~ SOLN
120.0000 mg | Freq: Once | SUBCUTANEOUS | Status: AC
  Administered 2021-08-28: 120 mg via SUBCUTANEOUS
  Filled 2021-08-28: qty 1.7

## 2021-08-28 MED ORDER — DIPHENHYDRAMINE HCL 50 MG/ML IJ SOLN
50.0000 mg | Freq: Once | INTRAMUSCULAR | Status: AC
  Administered 2021-08-28: 50 mg via INTRAVENOUS
  Filled 2021-08-28: qty 1

## 2021-08-28 MED ORDER — FAMOTIDINE IN NACL 20-0.9 MG/50ML-% IV SOLN
20.0000 mg | Freq: Once | INTRAVENOUS | Status: AC
  Administered 2021-08-28: 20 mg via INTRAVENOUS
  Filled 2021-08-28: qty 50

## 2021-08-28 MED ORDER — SODIUM CHLORIDE 0.9 % IV SOLN
6.0000 mg/kg | Freq: Once | INTRAVENOUS | Status: AC
  Administered 2021-08-28: 800 mg via INTRAVENOUS
  Filled 2021-08-28: qty 80

## 2021-08-28 MED ORDER — SODIUM CHLORIDE 0.9% FLUSH
10.0000 mL | INTRAVENOUS | Status: DC | PRN
  Administered 2021-08-28: 10 mL

## 2021-08-28 MED ORDER — PALONOSETRON HCL INJECTION 0.25 MG/5ML
0.2500 mg | Freq: Once | INTRAVENOUS | Status: AC
  Administered 2021-08-28: 0.25 mg via INTRAVENOUS
  Filled 2021-08-28: qty 5

## 2021-08-28 MED ORDER — HEPARIN SOD (PORK) LOCK FLUSH 100 UNIT/ML IV SOLN: 500.0000 [IU] | Freq: Once | INTRAVENOUS | Status: DC

## 2021-08-28 MED ORDER — HEPARIN SOD (PORK) LOCK FLUSH 100 UNIT/ML IV SOLN
500.0000 [IU] | Freq: Once | INTRAVENOUS | Status: AC | PRN
  Administered 2021-08-28: 500 [IU]

## 2021-08-28 MED ORDER — SODIUM CHLORIDE 0.9 % IV SOLN
150.0000 mg | Freq: Once | INTRAVENOUS | Status: AC
  Administered 2021-08-28: 150 mg via INTRAVENOUS
  Filled 2021-08-28: qty 150

## 2021-08-28 NOTE — Patient Instructions (Signed)

## 2021-08-28 NOTE — Patient Instructions (Signed)
[facility]  Discharge Instructions: ?Thank you for choosing [facility] to provide your oncology and hematology care.  ? ?If you have a lab appointment with the [facility], please go directly to the [facility] and check in at the registration area. ?  ?Wear comfortable clothing and clothing appropriate for easy access to any Portacath or PICC line.  ? ?We strive to give you quality time with your provider. You may need to reschedule your appointment if you arrive late (15 or more minutes).  Arriving late affects you and other patients whose appointments are after yours.  Also, if you miss three or more appointments without notifying the office, you may be dismissed from the clinic at the provider?s discretion.    ?  ?For prescription refill requests, have your pharmacy contact our office and allow 72 hours for refills to be completed.   ? ?Today you received the following chemotherapy and/or immunotherapy agents: Debbie [name]    ?  ?To help prevent nausea and vomiting after your treatment, we encourage you to take your nausea medication as directed. ? ?BELOW ARE SYMPTOMS THAT SHOULD BE REPORTED IMMEDIATELY: ?*FEVER GREATER THAN 100.4 F (38 ?C) OR HIGHER ?*CHILLS OR SWEATING ?*NAUSEA AND VOMITING THAT IS NOT CONTROLLED WITH YOUR NAUSEA MEDICATION ?*UNUSUAL SHORTNESS OF BREATH ?*UNUSUAL BRUISING OR BLEEDING ?*URINARY PROBLEMS (pain or burning when urinating, or frequent urination) ?*BOWEL PROBLEMS (unusual diarrhea, constipation, pain near the anus) ?TENDERNESS IN MOUTH AND THROAT WITH OR WITHOUT PRESENCE OF ULCERS (sore throat, sores in mouth, or a toothache) ?UNUSUAL RASH, SWELLING OR PAIN  ?UNUSUAL VAGINAL DISCHARGE OR ITCHING  ? ?Items with * indicate a potential emergency and should be followed up as soon as possible or go to the Emergency Department if any problems should occur. ? ?Please show the CHEMOTHERAPY ALERT CARD or IMMUNOTHERAPY ALERT CARD at check-in to  the Emergency Department and triage nurse. ? ?Should you have questions after your visit or need to cancel or reschedule your appointment, please contact [facility]  Dept: [phone]  and follow the prompts.  Office hours are 8:00 a.m. to 4:30 p.m. Monday - Friday. Please note that voicemails left after 4:00 p.m. may not be returned until the following business day.  We are closed weekends and major holidays. You have access to a nurse at all times for urgent questions. Please call the main number to the clinic Dept: [phone] and follow the prompts. ? ? ?For any non-urgent questions, you may also contact your provider using MyChart. We now offer e-Visits for anyone 58 and older to request care online for non-urgent symptoms. For details visit [email or web address]. ?  ?Also download the MyChart app! Go to the app store, search "MyChart", open the app, select [facility], and log in with your MyChart username and password. ? ?Due to Covid, a mask is required upon entering the hospital/clinic. If you do not have a mask, one will be given to you upon arrival. For doctor visits, patients may have 1 support Debbie [name] aged 58 or older with them. For treatment visits, patients cannot have anyone with them due to current Covid guidelines and our immunocompromised population.  ? ?Sacituzumab Govitecan Injection ?What is this medication? ?SACITUZUMAB GOVITECAN (SAK i TOOZ ue mab GOE vi TEE kan) is a monoclonal antibody combined with chemotherapy. It is used to treat breast cancer and urothelial cancer. ?This medicine may be used for other purposes; ask your health care provider or  pharmacist if you have questions. ?COMMON BRAND NAME(S): TRODELVY ?What should I tell my care team before I take this medication? ?They need to know if you have any of these conditions: ?diarrhea ?infection (especially a virus infection such as chickenpox, cold sores, or herpes) ?liver disease ?low blood counts,  like low white cell, platelet, or red cell counts ?an unusual or allergic reaction to sacituzumab govitecan, other medicines, foods, dyes, or preservatives ?pregnant or trying to get pregnant ?breast-feeding ?How should I use this medication? ?This medicine is for infusion into a vein. It is given by a healthcare professional in a hospital or clinic setting. ?Talk to your pediatrician about the use of this medicine in children. Special care may be needed. ?Overdosage: If you think you have taken too much of this medicine contact a poison control center or emergency room at once. ?NOTE: This medicine is only for you. Do not share this medicine with others. ?What if I miss a dose? ?Keep appointments for follow-up doses. It is important not to miss your dose. Call your doctor or health care professional if you are unable to keep an appointment. ?What may interact with this medication? ?certain antivirals for HIV or hepatitis ?gemfibrozil ?This list may not describe all possible interactions. Give your health care provider a list of all the medicines, herbs, non-prescription drugs, or dietary supplements you use. Also tell them if you smoke, drink alcohol, or use illegal drugs. Some items may interact with your medicine. ?What should I watch for while using this medication? ?Your condition will be monitored carefully while you are receiving this medicine. ?You may need blood work done while you are taking this medicine. ?This medicine can cause serious allergic reactions. To reduce your risk, you may need to take medicine before treatment with this medicine. Take your medicine as directed. ?Do not become pregnant while taking this medicine or for 6 months after stopping it. Women should inform their health care professional if they wish to become pregnant or think they might be pregnant. Men should not father a child while taking this medicine and for 3 months after stopping it. There is potential for serious side  effects to an unborn child. Talk to your health care professional for more information. ?Do not breast-feed a child while taking this medicine or for 1 month after stopping it. ?This medicine may increase your risk of getting an infection. Call your health care professional for advice if you get a fever, chills, or sore throat, or other symptoms of a cold or flu. Do not treat yourself. Try to avoid being around people who are sick. ?Avoid taking medicines that contain aspirin, acetaminophen, ibuprofen, naproxen, or ketoprofen unless instructed by your health care professional. These medicines may hide a fever. ?This medicine has caused ovarian failure in some women. This medicine may make it more difficult to get pregnant. Talk to your health care professional if you are concerned about your fertility. ?What side effects may I notice from receiving this medication? ?Side effects that you should report to your doctor or health care professional as soon as possible: ?allergic reactions like skin rash, itching or hives; swelling of the face, lips, or tongue ?diarrhea ?nausea/vomiting ?signs and symptoms of infection like fever; chills; cough; sore throat; pain or trouble passing urine ?signs and symptoms of low red blood cells or anemia such as unusually weak or tired; feeling faint or lightheaded; falls; breathing problems ?Side effects that usually do not require medical attention (report these to  your doctor or health care professional if they continue or are bothersome): ?constipation ?hair loss ?headache ?loss of appetite ?signs and symptoms of high blood sugar such as being more thirsty or hungry or having to urinate more than normal. You may also feel very tired or have blurry vision. ?trouble sleeping ?This list may not describe all possible side effects. Call your doctor for medical advice about side effects. You may report side effects to FDA at 1-800-FDA-1088. ?Where should I keep my medication? ?This  medicine is given in a hospital or clinic and will not be stored at home. ?NOTE: This sheet is a summary. It may not cover all possible information. If you have questions about this medicine, talk to your doctor,

## 2021-08-28 NOTE — Assessment & Plan Note (Signed)
Metastatic Breast Cancer: ER Pos and Her 2?Neg?(previously progressed on Ibrance, fulvestrant, everolimus, alpelisib, Xeloda) ?Current Treatment: Taxol since 09/10/2020 ?Chronic right arm lymphedema ?? ?CT CAP 06/24/2021: Stable mediastinal and hilar lymphadenopathy, stable to mild increase in the multifocal liver metastases, similar appearance of diffuse mixed lytic and sclerotic bone metastases, multiple small lung nodules stable to mildly increased in the interval ?? ?IHC: HER2 0 ?Current treatment:?Palliative treatment with Sacituzumab-Govitecan, today cycle 3 ?? ?Toxicities: ?1.  Neutropenia: We reduced the dosage of Trodelvy with cycle 2 ?2. Fatigue ?3.  Chemotherapy-induced anemia today's hemoglobin is 10.5 ?4.  Nausea: I instructed her to to proactively take antinausea medicines from day 3 onwards twice a day to prevent nausea. ?5.  Diarrhea: She has prescriptions for Lomotil. ?? ?I renewed her Lyrica today. ?? ?Return to clinic for Trodelvy day 1 day 8 every 3 weeks.  We will obtain scans after this cycle of treatment. ?

## 2021-09-04 ENCOUNTER — Inpatient Hospital Stay: Payer: 59

## 2021-09-04 VITALS — BP 159/80 | HR 88 | Temp 99.3°F | Resp 19 | Wt 290.5 lb

## 2021-09-04 DIAGNOSIS — C50112 Malignant neoplasm of central portion of left female breast: Secondary | ICD-10-CM

## 2021-09-04 DIAGNOSIS — Z170 Estrogen receptor positive status [ER+]: Secondary | ICD-10-CM

## 2021-09-04 DIAGNOSIS — C7800 Secondary malignant neoplasm of unspecified lung: Secondary | ICD-10-CM

## 2021-09-04 DIAGNOSIS — C50211 Malignant neoplasm of upper-inner quadrant of right female breast: Secondary | ICD-10-CM | POA: Diagnosis not present

## 2021-09-04 DIAGNOSIS — R918 Other nonspecific abnormal finding of lung field: Secondary | ICD-10-CM | POA: Diagnosis not present

## 2021-09-04 DIAGNOSIS — C787 Secondary malignant neoplasm of liver and intrahepatic bile duct: Secondary | ICD-10-CM | POA: Diagnosis not present

## 2021-09-04 DIAGNOSIS — Z79899 Other long term (current) drug therapy: Secondary | ICD-10-CM | POA: Diagnosis not present

## 2021-09-04 DIAGNOSIS — Z95828 Presence of other vascular implants and grafts: Secondary | ICD-10-CM

## 2021-09-04 DIAGNOSIS — Z5111 Encounter for antineoplastic chemotherapy: Secondary | ICD-10-CM | POA: Diagnosis not present

## 2021-09-04 DIAGNOSIS — C7951 Secondary malignant neoplasm of bone: Secondary | ICD-10-CM | POA: Diagnosis not present

## 2021-09-04 DIAGNOSIS — Z923 Personal history of irradiation: Secondary | ICD-10-CM | POA: Diagnosis not present

## 2021-09-04 LAB — CBC WITH DIFFERENTIAL (CANCER CENTER ONLY)
Abs Immature Granulocytes: 0.07 10*3/uL (ref 0.00–0.07)
Basophils Absolute: 0 10*3/uL (ref 0.0–0.1)
Basophils Relative: 1 %
Eosinophils Absolute: 0.1 10*3/uL (ref 0.0–0.5)
Eosinophils Relative: 3 %
HCT: 32.6 % — ABNORMAL LOW (ref 36.0–46.0)
Hemoglobin: 10.7 g/dL — ABNORMAL LOW (ref 12.0–15.0)
Immature Granulocytes: 2 %
Lymphocytes Relative: 16 %
Lymphs Abs: 0.6 10*3/uL — ABNORMAL LOW (ref 0.7–4.0)
MCH: 29.8 pg (ref 26.0–34.0)
MCHC: 32.8 g/dL (ref 30.0–36.0)
MCV: 90.8 fL (ref 80.0–100.0)
Monocytes Absolute: 0.5 10*3/uL (ref 0.1–1.0)
Monocytes Relative: 14 %
Neutro Abs: 2.3 10*3/uL (ref 1.7–7.7)
Neutrophils Relative %: 64 %
Platelet Count: 250 10*3/uL (ref 150–400)
RBC: 3.59 MIL/uL — ABNORMAL LOW (ref 3.87–5.11)
RDW: 20.4 % — ABNORMAL HIGH (ref 11.5–15.5)
WBC Count: 3.6 10*3/uL — ABNORMAL LOW (ref 4.0–10.5)
nRBC: 0 % (ref 0.0–0.2)

## 2021-09-04 LAB — CMP (CANCER CENTER ONLY)
ALT: 47 U/L — ABNORMAL HIGH (ref 0–44)
AST: 37 U/L (ref 15–41)
Albumin: 3.5 g/dL (ref 3.5–5.0)
Alkaline Phosphatase: 182 U/L — ABNORMAL HIGH (ref 38–126)
Anion gap: 6 (ref 5–15)
BUN: 14 mg/dL (ref 6–20)
CO2: 31 mmol/L (ref 22–32)
Calcium: 8.9 mg/dL (ref 8.9–10.3)
Chloride: 103 mmol/L (ref 98–111)
Creatinine: 0.78 mg/dL (ref 0.44–1.00)
GFR, Estimated: >60 mL/min (ref >60)
Glucose, Bld: 119 mg/dL — ABNORMAL HIGH (ref 70–99)
Potassium: 3.3 mmol/L — ABNORMAL LOW (ref 3.5–5.1)
Sodium: 140 mmol/L (ref 135–145)
Total Bilirubin: 0.3 mg/dL (ref 0.3–1.2)
Total Protein: 6.4 g/dL — ABNORMAL LOW (ref 6.5–8.1)

## 2021-09-04 MED ORDER — SODIUM CHLORIDE 0.9 % IV SOLN
10.0000 mg | Freq: Once | INTRAVENOUS | Status: AC
  Administered 2021-09-04: 10 mg via INTRAVENOUS
  Filled 2021-09-04: qty 10

## 2021-09-04 MED ORDER — SODIUM CHLORIDE 0.9 % IV SOLN
150.0000 mg | Freq: Once | INTRAVENOUS | Status: AC
  Administered 2021-09-04: 150 mg via INTRAVENOUS
  Filled 2021-09-04: qty 150

## 2021-09-04 MED ORDER — FAMOTIDINE IN NACL 20-0.9 MG/50ML-% IV SOLN
20.0000 mg | Freq: Once | INTRAVENOUS | Status: AC
  Administered 2021-09-04: 20 mg via INTRAVENOUS
  Filled 2021-09-04: qty 50

## 2021-09-04 MED ORDER — PALONOSETRON HCL INJECTION 0.25 MG/5ML
0.2500 mg | Freq: Once | INTRAVENOUS | Status: AC
  Administered 2021-09-04: 0.25 mg via INTRAVENOUS
  Filled 2021-09-04: qty 5

## 2021-09-04 MED ORDER — SODIUM CHLORIDE 0.9 % IV SOLN: Freq: Once | INTRAVENOUS | Status: AC

## 2021-09-04 MED ORDER — HEPARIN SOD (PORK) LOCK FLUSH 100 UNIT/ML IV SOLN
500.0000 [IU] | Freq: Once | INTRAVENOUS | Status: AC | PRN
  Administered 2021-09-04: 500 [IU]

## 2021-09-04 MED ORDER — SODIUM CHLORIDE 0.9% FLUSH
10.0000 mL | INTRAVENOUS | Status: DC | PRN
  Administered 2021-09-04: 10 mL

## 2021-09-04 MED ORDER — ACETAMINOPHEN 325 MG PO TABS
650.0000 mg | ORAL_TABLET | Freq: Once | ORAL | Status: AC
  Administered 2021-09-04: 650 mg via ORAL
  Filled 2021-09-04: qty 2

## 2021-09-04 MED ORDER — DIPHENHYDRAMINE HCL 50 MG/ML IJ SOLN
50.0000 mg | Freq: Once | INTRAMUSCULAR | Status: AC
  Administered 2021-09-04: 50 mg via INTRAVENOUS
  Filled 2021-09-04: qty 1

## 2021-09-04 MED ORDER — SODIUM CHLORIDE 0.9% FLUSH
10.0000 mL | Freq: Once | INTRAVENOUS | Status: AC
  Administered 2021-09-04: 10 mL

## 2021-09-04 MED ORDER — SODIUM CHLORIDE 0.9 % IV SOLN
6.0000 mg/kg | Freq: Once | INTRAVENOUS | Status: AC
  Administered 2021-09-04: 800 mg via INTRAVENOUS
  Filled 2021-09-04: qty 80

## 2021-09-04 MED ORDER — ATROPINE SULFATE 1 MG/ML IV SOLN
0.5000 mg | Freq: Once | INTRAVENOUS | Status: AC | PRN
  Administered 2021-09-04: 0.5 mg via INTRAVENOUS
  Filled 2021-09-04: qty 1

## 2021-09-04 NOTE — Progress Notes (Signed)
? ?Patient Care Team: ?Kelton Pillar, MD as PCP - General (Family Medicine) ?Jacelyn Pi, MD as Consulting Physician (Endocrinology) ?Jovita Kussmaul, MD as Consulting Physician (General Surgery) ?[name], MD as Attending Physician (Radiation Oncology) ?Belva Crome, MD as Consulting Physician (Cardiology) ?Gardenia Phlegm, NP as Nurse Practitioner (Hematology and Oncology) ?Lajoyce Lauber, DMD (Dentistry) ?Raina Mina, RPH-CPP (Pharmacist) ?Nicholas Lose, MD as Consulting Physician (Hematology and Oncology) ? ?DIAGNOSIS:  ?Encounter Diagnosis  ?Name Primary?  ? Malignant neoplasm of upper-inner quadrant of right breast in female, estrogen receptor positive ([place])   ? ? ?SUMMARY OF ONCOLOGIC HISTORY: ?Oncology History  ?Malignant neoplasm of upper-inner quadrant of right breast in female, estrogen receptor positive ([place])  ?10/15/2015 Initial Biopsy  ? Right breast upper inner quadrant biopsy: IDC, DCIS, grade 2, ER+(90%), PR+(50%), Ki67 70%, HER-2 negative (ratio 1.18). ? ?  ?10/17/2015 Initial Diagnosis  ? Malignant neoplasm of upper-inner quadrant of right breast in female, estrogen receptor positive ([place]) ? ?  ?10/2015 -  Anti-estrogen oral therapy  ? Tamoxifen daily ? ?  ?11/28/2015 Surgery  ? Left breast mammoplasty: IDC, grade 1, 1.2cm, DCIS, lobular neoplasia, T1c, Nx, ER 80%, PR60%, Ki67 3% HER-2 neg ?Right breast lumpectomy: IDC, grade 1, 2.3cm, DCIS. T2N1a ER/PR+HER-2 neg, Ki 70%. ? ?  ?11/28/2015 Miscellaneous  ? Mammaprint was Low Risk. Predicting a 98% chance of no disease recurrence within 5 years with anti-estrogens as the only systemic therapy.  It also predicts minimal to no benefit from chemotherapy.   ? ?  ?12/26/2015 Surgery  ? Bilateral mastectomies: bilateral sentinel node sampling, Right: no residual carcinoma, left, focal DCIS, 4 SLN negative ? ?  ?02/11/2016 - 03/27/2016 Radiation Therapy  ? Adjuvant radiation [name]): 1) Right chest wall: 50.4 Gy in 28 fractions  2)  Right chest wall boost: 10 Gy in 5 fractions ? ?  ?08/04/2017 Relapse/Recurrence  ? 08/04/2017, MRI of the liver and total spine MRI 08/11/2017 showed liver and bone metastasis; liver biopsy 09/07/2017 confirms estrogen receptor positive, progesterone receptor negative, HER-2 negative metastatic breast cancer; chest CT scan 08/13/2017 shows multiple bilateral pulmonary nodules. ?  ?08/17/2017 - 01/04/2018 Anti-estrogen oral therapy  ? fulvestrant  palbociclib started 08/31/2017 discontinued 01/04/2018 with evidence of progression in the liver ?  ?08/2017 Miscellaneous  ? [facility] report on liver biopsy from April 2019 shows a negative PDL 1, stable MSI and proficient mismatch repair status.  The tumor is positive for the androgen receptor, and for PTEN for PIK3 CA mutations.  HER-2/neu was read as equivocal (it was negative by FISH on the pathology report). ?  ?01/04/2018 Miscellaneous  ? anastrozole started 01/04/2018 ?everolimus started 01/29/2018 ?  ?02/16/2018 - 03/01/2018 Radiation Therapy  ? 01/28/18 prophylactic left femur intramedullary nail surgery for impending left femur pathologic fracture ?alliative radiation with Dr. [name] to the left femur and left ilium from 02/16/2018 to 03/01/2018 ?  ?01/30/2020 - 05/2020 Anti-estrogen oral therapy  ? alpelisib started 01/30/2020 at 300 mg daily with Metformin 500 mg daily and Faslodex (stopped for progression) ?  ?05/22/2020 - 08/2020 Chemotherapy  ? Xeloda discontinued for progression ?  ?09/10/2020 - 06/18/2021 Chemotherapy  ? Patient is on Treatment Plan : BREAST Paclitaxel D1,D15 q28d  ? ?   ?07/17/2021 -  Chemotherapy  ? Patient is on Treatment Plan : BREAST METASTATIC Sacituzumab govitecan-hziy [name]) q21d  ? ?   ?Bilateral malignant neoplasm of breast in female, estrogen receptor positive ([place])  ?12/26/2015 Initial Diagnosis  ? Bilateral malignant  neoplasm of breast in female, estrogen receptor positive ([facility]) ? ?  ?07/17/2021 -  Chemotherapy  ? Patient is on Treatment Plan :  BREAST METASTATIC Sacituzumab govitecan-hziy [name]) q21d  ? ?   ?Malignant neoplasm metastatic to liver [facility])  ?08/13/2017 Initial Diagnosis  ? Liver metastases ([place]) ? ?  ?07/17/2021 -  Chemotherapy  ? Patient is on Treatment Plan : BREAST METASTATIC Sacituzumab govitecan-hziy [name]) q21d  ? ?   ?Malignant neoplasm metastatic to lung [facility])  ?09/10/2020 Initial Diagnosis  ? Lung metastases ([place]) ? ?  ?07/17/2021 -  Chemotherapy  ? Patient is on Treatment Plan : BREAST METASTATIC Sacituzumab govitecan-hziy [name]) q21d  ? ?   ? ? ?CHIEF COMPLIANT: Cycle 4 Trodelvy ? ?INTERVAL HISTORY: Debbie [name] is a 56 year old with above-mentioned history of metastatic breast cancer. She presents to the clinic for a follow-up. She denies nausea and diarrhea. She state that the constipation is still there but she does stool softener. ? ? ?ALLERGIES:  is allergic to ace inhibitors, atorvastatin, simvastatin, and percocet [oxycodone-acetaminophen]. ? ?MEDICATIONS:  ?Current Outpatient Medications  ?Medication Sig Dispense Refill  ? acetaminophen (TYLENOL) 500 MG tablet Take 1,000 mg by mouth every 6 (six) hours as needed.    ? Calcium-Magnesium 250-125 MG TABS Take 1 tablet by mouth daily. 120 each 4  ? cetirizine (ZYRTEC) 10 MG tablet Take 10 mg by mouth daily as needed for allergies.     ? chlorhexidine (PERIDEX) 0.12 % solution Use 5 mLs in the mouth or throat 2 (two) times daily as needed as directed. Do not swallow 473 mL 6  ? diphenoxylate-atropine (LOMOTIL) 2.5-0.025 MG tablet Take 1 tablet by mouth 4 (four) times daily as needed for diarrhea or loose stools. 30 tablet 2  ? doxycycline (VIBRA-TABS) 100 MG tablet Take 1 tablet (100 mg total) by mouth daily. 30 tablet 2  ? furosemide (LASIX) 20 MG tablet Take 1 tablet (20 mg total) by mouth daily. 90 tablet 6  ? levothyroxine (SYNTHROID) 175 MCG tablet Take 1 tablet (175 mcg total) by mouth in the morning on an empty stomach 30 tablet 6  ? losartan-hydrochlorothiazide  (HYZAAR) 100-25 MG tablet Take 1 tablet by mouth once a day 90 tablet 2  ? Multiple Vitamin (MULTIVITAMIN) tablet Take 1 tablet by mouth daily.    ? omega-3 acid ethyl esters (LOVAZA) 1 g capsule Take 1 g by mouth 2 (two) times daily.     ? omeprazole (PRILOSEC) 40 MG capsule Take 1 capsule (40 mg total) by mouth daily. 30 capsule 3  ? ondansetron (ZOFRAN-ODT) 8 MG disintegrating tablet Dissolve 1 tablet (8 mg total) by mouth every 8 (eight) hours as needed for nausea or vomiting. 20 tablet 0  ? oxyCODONE (OXY IR/ROXICODONE) 5 MG immediate release tablet Take 1 tablet (5 mg total) by mouth every 4 (four) hours as needed for severe pain. 60 tablet 0  ? potassium chloride SA (KLOR-CON) 20 MEQ tablet Take 2 tablets (40 mEq total) by mouth daily. 90 tablet 4  ? pregabalin (LYRICA) 100 MG capsule Take 1 capsule by mouth 3 times daily. 90 capsule 3  ? rosuvastatin (CRESTOR) 5 MG tablet Take 1 tablet (5 mg total) by mouth daily. 90 tablet 1  ? sertraline (ZOLOFT) 100 MG tablet TAKE 1 TABLET BY MOUTH ONCE DAILY 30 tablet 12  ? valACYclovir (VALTREX) 1000 MG tablet Take 1 tablet (1,000 mg total) by mouth daily. 60 tablet 6  ? Vitamin D, Cholecalciferol, 1000 units CAPS  Take 1 tablet by mouth daily. (Patient taking differently: Take 1,000 Units by mouth daily.) 90 capsule 4  ? ?No current facility-administered medications for this visit.  ? ? ?PHYSICAL EXAMINATION: ?ECOG PERFORMANCE STATUS: 1 - Symptomatic but completely ambulatory ? ?Vitals:  ? 09/18/21 0818  ?BP: (!) 176/78  ?Pulse: 88  ?Resp: 18  ?Temp: (!) 97.2 ?F (36.2 ?C)  ?SpO2: 96%  ? ?Filed Weights  ? 09/18/21 0818  ?Weight: 292 lb 14.4 oz (132.9 kg)  ? ?  ? ?LABORATORY DATA:  ?I have reviewed the data as listed ? ?  Latest Ref Rng & Units 09/04/2021  ?  7:57 AM 08/28/2021  ?  7:57 AM 08/14/2021  ?  8:12 AM  ?CMP  ?Glucose 70 - 99 mg/dL 119   102   92    ?BUN 6 - 20 mg/dL '14   13   14    ' ?Creatinine 0.44 - 1.00 mg/dL 0.78   0.77   0.90    ?Sodium 135 - 145 mmol/L 140    141   141    ?Potassium 3.5 - 5.1 mmol/L 3.3   3.4   3.4    ?Chloride 98 - 111 mmol/L 103   103   103    ?CO2 22 - 32 mmol/L 31   32   32    ?Calcium 8.9 - 10.3 mg/dL 8.9   9.0   8.4    ?Total Protein

## 2021-09-05 LAB — CANCER ANTIGEN 27.29: CA 27.29: 404.2 U/mL — ABNORMAL HIGH (ref 0.0–38.6)

## 2021-09-09 ENCOUNTER — Other Ambulatory Visit (HOSPITAL_COMMUNITY): Payer: Self-pay

## 2021-09-16 ENCOUNTER — Ambulatory Visit (HOSPITAL_COMMUNITY)
Admission: RE | Admit: 2021-09-16 | Discharge: 2021-09-16 | Disposition: A | Discharge status: Home / Self Care | Payer: 59 | Source: Ambulatory Visit | Attending: Hematology and Oncology | Admitting: Hematology and Oncology

## 2021-09-16 ENCOUNTER — Encounter (HOSPITAL_COMMUNITY): Payer: Self-pay

## 2021-09-16 DIAGNOSIS — Z170 Estrogen receptor positive status [ER+]: Secondary | ICD-10-CM | POA: Insufficient documentation

## 2021-09-16 DIAGNOSIS — J701 Chronic and other pulmonary manifestations due to radiation: Secondary | ICD-10-CM | POA: Diagnosis not present

## 2021-09-16 DIAGNOSIS — R918 Other nonspecific abnormal finding of lung field: Secondary | ICD-10-CM | POA: Diagnosis not present

## 2021-09-16 DIAGNOSIS — C50211 Malignant neoplasm of upper-inner quadrant of right female breast: Secondary | ICD-10-CM | POA: Diagnosis not present

## 2021-09-16 DIAGNOSIS — C787 Secondary malignant neoplasm of liver and intrahepatic bile duct: Secondary | ICD-10-CM | POA: Insufficient documentation

## 2021-09-16 DIAGNOSIS — K760 Fatty (change of) liver, not elsewhere classified: Secondary | ICD-10-CM | POA: Diagnosis not present

## 2021-09-16 DIAGNOSIS — J941 Fibrothorax: Secondary | ICD-10-CM | POA: Diagnosis not present

## 2021-09-16 DIAGNOSIS — C50919 Malignant neoplasm of unspecified site of unspecified female breast: Secondary | ICD-10-CM | POA: Diagnosis not present

## 2021-09-16 DIAGNOSIS — R590 Localized enlarged lymph nodes: Secondary | ICD-10-CM | POA: Diagnosis not present

## 2021-09-16 MED ORDER — IOHEXOL 300 MG/ML  SOLN
100.0000 mL | Freq: Once | INTRAMUSCULAR | Status: AC | PRN
  Administered 2021-09-16: 100 mL via INTRAVENOUS

## 2021-09-16 MED ORDER — SODIUM CHLORIDE (PF) 0.9 % IJ SOLN
INTRAMUSCULAR | Status: AC
  Filled 2021-09-16: qty 50

## 2021-09-16 MED ORDER — HEPARIN SOD (PORK) LOCK FLUSH 100 UNIT/ML IV SOLN: 500.0000 [IU] | Freq: Once | INTRAVENOUS | Status: AC

## 2021-09-16 MED ORDER — HEPARIN SOD (PORK) LOCK FLUSH 100 UNIT/ML IV SOLN
INTRAVENOUS | Status: AC
  Administered 2021-09-16: 500 [IU] via INTRAVENOUS
  Filled 2021-09-16: qty 5

## 2021-09-18 ENCOUNTER — Inpatient Hospital Stay: Payer: 59 | Attending: Oncology

## 2021-09-18 ENCOUNTER — Inpatient Hospital Stay: Payer: 59

## 2021-09-18 ENCOUNTER — Other Ambulatory Visit (HOSPITAL_COMMUNITY): Payer: Self-pay

## 2021-09-18 ENCOUNTER — Inpatient Hospital Stay (HOSPITAL_BASED_OUTPATIENT_CLINIC_OR_DEPARTMENT_OTHER): Payer: 59 | Admitting: Hematology and Oncology

## 2021-09-18 ENCOUNTER — Other Ambulatory Visit: Payer: Self-pay

## 2021-09-18 ENCOUNTER — Inpatient Hospital Stay: Payer: 59 | Admitting: Hematology and Oncology

## 2021-09-18 DIAGNOSIS — C7800 Secondary malignant neoplasm of unspecified lung: Secondary | ICD-10-CM

## 2021-09-18 DIAGNOSIS — C787 Secondary malignant neoplasm of liver and intrahepatic bile duct: Secondary | ICD-10-CM | POA: Diagnosis not present

## 2021-09-18 DIAGNOSIS — Z170 Estrogen receptor positive status [ER+]: Secondary | ICD-10-CM

## 2021-09-18 DIAGNOSIS — Z95828 Presence of other vascular implants and grafts: Secondary | ICD-10-CM

## 2021-09-18 DIAGNOSIS — I890 Lymphedema, not elsewhere classified: Secondary | ICD-10-CM | POA: Insufficient documentation

## 2021-09-18 DIAGNOSIS — C7951 Secondary malignant neoplasm of bone: Secondary | ICD-10-CM | POA: Insufficient documentation

## 2021-09-18 DIAGNOSIS — Z5111 Encounter for antineoplastic chemotherapy: Secondary | ICD-10-CM | POA: Diagnosis not present

## 2021-09-18 DIAGNOSIS — C50211 Malignant neoplasm of upper-inner quadrant of right female breast: Secondary | ICD-10-CM

## 2021-09-18 DIAGNOSIS — Z79899 Other long term (current) drug therapy: Secondary | ICD-10-CM | POA: Insufficient documentation

## 2021-09-18 LAB — CBC WITH DIFFERENTIAL (CANCER CENTER ONLY)
Abs Immature Granulocytes: 0.02 10*3/uL (ref 0.00–0.07)
Basophils Absolute: 0 10*3/uL (ref 0.0–0.1)
Basophils Relative: 1 %
Eosinophils Absolute: 0.1 10*3/uL (ref 0.0–0.5)
Eosinophils Relative: 2 %
HCT: 32.5 % — ABNORMAL LOW (ref 36.0–46.0)
Hemoglobin: 10.7 g/dL — ABNORMAL LOW (ref 12.0–15.0)
Immature Granulocytes: 1 %
Lymphocytes Relative: 12 %
Lymphs Abs: 0.5 10*3/uL — ABNORMAL LOW (ref 0.7–4.0)
MCH: 29.9 pg (ref 26.0–34.0)
MCHC: 32.9 g/dL (ref 30.0–36.0)
MCV: 90.8 fL (ref 80.0–100.0)
Monocytes Absolute: 0.5 10*3/uL (ref 0.1–1.0)
Monocytes Relative: 11 %
Neutro Abs: 3 10*3/uL (ref 1.7–7.7)
Neutrophils Relative %: 73 %
Platelet Count: 228 10*3/uL (ref 150–400)
RBC: 3.58 MIL/uL — ABNORMAL LOW (ref 3.87–5.11)
RDW: 20.7 % — ABNORMAL HIGH (ref 11.5–15.5)
WBC Count: 4.1 10*3/uL (ref 4.0–10.5)
nRBC: 0.7 % — ABNORMAL HIGH (ref 0.0–0.2)

## 2021-09-18 LAB — CMP (CANCER CENTER ONLY)
ALT: 64 U/L — ABNORMAL HIGH (ref 0–44)
AST: 43 U/L — ABNORMAL HIGH (ref 15–41)
Albumin: 3.5 g/dL (ref 3.5–5.0)
Alkaline Phosphatase: 184 U/L — ABNORMAL HIGH (ref 38–126)
Anion gap: 7 (ref 5–15)
BUN: 12 mg/dL (ref 6–20)
CO2: 32 mmol/L (ref 22–32)
Calcium: 9.3 mg/dL (ref 8.9–10.3)
Chloride: 102 mmol/L (ref 98–111)
Creatinine: 0.75 mg/dL (ref 0.44–1.00)
GFR, Estimated: >60 mL/min (ref >60)
Glucose, Bld: 123 mg/dL — ABNORMAL HIGH (ref 70–99)
Potassium: 3.2 mmol/L — ABNORMAL LOW (ref 3.5–5.1)
Sodium: 141 mmol/L (ref 135–145)
Total Bilirubin: 0.4 mg/dL (ref 0.3–1.2)
Total Protein: 6.6 g/dL (ref 6.5–8.1)

## 2021-09-18 MED ORDER — SODIUM CHLORIDE 0.9 % IV SOLN
10.0000 mg | Freq: Once | INTRAVENOUS | Status: AC
  Administered 2021-09-18: 10 mg via INTRAVENOUS
  Filled 2021-09-18: qty 10

## 2021-09-18 MED ORDER — PALONOSETRON HCL INJECTION 0.25 MG/5ML
0.2500 mg | Freq: Once | INTRAVENOUS | Status: AC
  Administered 2021-09-18: 0.25 mg via INTRAVENOUS
  Filled 2021-09-18: qty 5

## 2021-09-18 MED ORDER — HEPARIN SOD (PORK) LOCK FLUSH 100 UNIT/ML IV SOLN
500.0000 [IU] | Freq: Once | INTRAVENOUS | Status: AC | PRN
  Administered 2021-09-18: 500 [IU]

## 2021-09-18 MED ORDER — SODIUM CHLORIDE 0.9 % IV SOLN: Freq: Once | INTRAVENOUS | Status: AC

## 2021-09-18 MED ORDER — FAMOTIDINE IN NACL 20-0.9 MG/50ML-% IV SOLN
20.0000 mg | Freq: Once | INTRAVENOUS | Status: AC
  Administered 2021-09-18: 20 mg via INTRAVENOUS
  Filled 2021-09-18: qty 50

## 2021-09-18 MED ORDER — SODIUM CHLORIDE 0.9% FLUSH
10.0000 mL | INTRAVENOUS | Status: DC | PRN
  Administered 2021-09-18: 10 mL

## 2021-09-18 MED ORDER — ATROPINE SULFATE 1 MG/ML IV SOLN
0.5000 mg | Freq: Once | INTRAVENOUS | Status: AC | PRN
  Administered 2021-09-18: 0.5 mg via INTRAVENOUS
  Filled 2021-09-18: qty 1

## 2021-09-18 MED ORDER — ACETAMINOPHEN 325 MG PO TABS
650.0000 mg | ORAL_TABLET | Freq: Once | ORAL | Status: AC
  Administered 2021-09-18: 650 mg via ORAL
  Filled 2021-09-18: qty 2

## 2021-09-18 MED ORDER — SODIUM CHLORIDE 0.9% FLUSH
10.0000 mL | Freq: Once | INTRAVENOUS | Status: AC
  Administered 2021-09-18: 10 mL

## 2021-09-18 MED ORDER — DIPHENHYDRAMINE HCL 50 MG/ML IJ SOLN
50.0000 mg | Freq: Once | INTRAMUSCULAR | Status: AC
  Administered 2021-09-18: 50 mg via INTRAVENOUS
  Filled 2021-09-18: qty 1

## 2021-09-18 MED ORDER — SODIUM CHLORIDE 0.9 % IV SOLN
6.0000 mg/kg | Freq: Once | INTRAVENOUS | Status: AC
  Administered 2021-09-18: 800 mg via INTRAVENOUS
  Filled 2021-09-18: qty 80

## 2021-09-18 MED ORDER — SODIUM CHLORIDE 0.9 % IV SOLN
150.0000 mg | Freq: Once | INTRAVENOUS | Status: AC
  Administered 2021-09-18: 150 mg via INTRAVENOUS
  Filled 2021-09-18: qty 150

## 2021-09-18 NOTE — Assessment & Plan Note (Signed)
Metastatic Breast Cancer: ER Pos and Her 2?Neg?(previously progressed on Ibrance, fulvestrant, everolimus, alpelisib, Xeloda) ?Current Treatment: Taxol since 09/10/2020 ?Chronic right arm lymphedema ?? ?CT CAP 06/24/2021: Stable mediastinal and hilar lymphadenopathy, stable to mild increase in the multifocal liver metastases, similar appearance of diffuse mixed lytic and sclerotic bone metastases, multiple small lung nodules stable to mildly increased in the interval ?? ?IHC: HER2 0 ?Current treatment:?Palliative treatment with Sacituzumab-Govitecan, today cycle 4 ?? ?Toxicities: ?1.??Neutropenia: We reduced the dosage of Trodelvy with cycle 2 ?2.?Fatigue ?3.??Chemotherapy-induced anemia today's hemoglobin is 10.9 ?4.??Nausea: Nausea is now mild ?5.??Diarrhea: She has prescriptions for Lomotil.  Constipation alternating with diarrhea ?  ?CT CAP 09/16/2021: Slight interval decrease in the size of the enlarged pretracheal, subcarinal, right hilar lymph nodes.  Numerous small bilateral lung nodules unchanged.  Numerous lesions in the liver poorly visualized.  Unchanged bone metastases. ? ?Radiology review: Scans appear to show response to treatment.  Therefore we will continue with the same. ?Return to clinic for Trodelvy day 1 day 8 every 3 weeks.  ?

## 2021-09-18 NOTE — Patient Instructions (Addendum)
[facility]  ? Discharge Instructions: ?Thank you for choosing [facility] to provide your oncology and hematology care.  ? ?If you have a lab appointment with the [facility], please go directly to the [facility] and check in at the registration area. ?  ?Wear comfortable clothing and clothing appropriate for easy access to any Portacath or PICC line.  ? ?We strive to give you quality time with your provider. You may need to reschedule your appointment if you arrive late (15 or more minutes).  Arriving late affects you and other patients whose appointments are after yours.  Also, if you miss three or more appointments without notifying the office, you may be dismissed from the clinic at the provider?s discretion.    ?  ?For prescription refill requests, have your pharmacy contact our office and allow 72 hours for refills to be completed.   ? ?Today you received the following chemotherapy and/or immunotherapy agents: sacituzumab-govitecan ?  ?To help prevent nausea and vomiting after your treatment, we encourage you to take your nausea medication as directed. ? ?BELOW ARE SYMPTOMS THAT SHOULD BE REPORTED IMMEDIATELY: ?*FEVER GREATER THAN 100.4 F (38 ?C) OR HIGHER ?*CHILLS OR SWEATING ?*NAUSEA AND VOMITING THAT IS NOT CONTROLLED WITH YOUR NAUSEA MEDICATION ?*UNUSUAL SHORTNESS OF BREATH ?*UNUSUAL BRUISING OR BLEEDING ?*URINARY PROBLEMS (pain or burning when urinating, or frequent urination) ?*BOWEL PROBLEMS (unusual diarrhea, constipation, pain near the anus) ?TENDERNESS IN MOUTH AND THROAT WITH OR WITHOUT PRESENCE OF ULCERS (sore throat, sores in mouth, or a toothache) ?UNUSUAL RASH, SWELLING OR PAIN  ?UNUSUAL VAGINAL DISCHARGE OR ITCHING  ? ?Items with * indicate a potential emergency and should be followed up as soon as possible or go to the Emergency Department if any problems should occur. ? ?Please show the CHEMOTHERAPY ALERT CARD or IMMUNOTHERAPY ALERT CARD at  check-in to the Emergency Department and triage nurse. ? ?Should you have questions after your visit or need to cancel or reschedule your appointment, please contact [facility]  Dept: [phone]  and follow the prompts.  Office hours are 8:00 a.m. to 4:30 p.m. Monday - Friday. Please note that voicemails left after 4:00 p.m. may not be returned until the following business day.  We are closed weekends and major holidays. You have access to a nurse at all times for urgent questions. Please call the main number to the clinic Dept: [phone] and follow the prompts. ? ? ?For any non-urgent questions, you may also contact your provider using MyChart. We now offer e-Visits for anyone 11 and older to request care online for non-urgent symptoms. For details visit [email or web address]. ?  ?Also download the MyChart app! Go to the app store, search "MyChart", open the app, select [facility], and log in with your MyChart username and password. ? ?Due to Covid, a mask is required upon entering the hospital/clinic. If you do not have a mask, one will be given to you upon arrival. For doctor visits, patients may have 1 support person aged 62 or older with them. For treatment visits, patients cannot have anyone with them due to current Covid guidelines and our immunocompromised population.  ? ?

## 2021-09-19 ENCOUNTER — Other Ambulatory Visit (HOSPITAL_COMMUNITY): Payer: Self-pay

## 2021-09-19 LAB — CANCER ANTIGEN 27.29: CA 27.29: 388.7 U/mL — ABNORMAL HIGH (ref 0.0–38.6)

## 2021-09-24 ENCOUNTER — Other Ambulatory Visit: Payer: Self-pay | Admitting: Hematology and Oncology

## 2021-09-24 MED FILL — Dexamethasone Sodium Phosphate Inj 100 MG/10ML: INTRAMUSCULAR | Qty: 1 | Status: AC

## 2021-09-24 MED FILL — Fosaprepitant Dimeglumine For IV Infusion 150 MG (Base Eq): INTRAVENOUS | Qty: 5 | Status: AC

## 2021-09-25 ENCOUNTER — Inpatient Hospital Stay: Payer: 59

## 2021-09-25 ENCOUNTER — Other Ambulatory Visit: Payer: Self-pay

## 2021-09-25 VITALS — BP 133/70 | HR 94 | Temp 98.5°F | Resp 18 | Wt 293.2 lb

## 2021-09-25 DIAGNOSIS — Z170 Estrogen receptor positive status [ER+]: Secondary | ICD-10-CM

## 2021-09-25 DIAGNOSIS — I890 Lymphedema, not elsewhere classified: Secondary | ICD-10-CM | POA: Diagnosis not present

## 2021-09-25 DIAGNOSIS — Z79899 Other long term (current) drug therapy: Secondary | ICD-10-CM | POA: Diagnosis not present

## 2021-09-25 DIAGNOSIS — C50211 Malignant neoplasm of upper-inner quadrant of right female breast: Secondary | ICD-10-CM | POA: Diagnosis not present

## 2021-09-25 DIAGNOSIS — C7800 Secondary malignant neoplasm of unspecified lung: Secondary | ICD-10-CM

## 2021-09-25 DIAGNOSIS — Z95828 Presence of other vascular implants and grafts: Secondary | ICD-10-CM

## 2021-09-25 DIAGNOSIS — C787 Secondary malignant neoplasm of liver and intrahepatic bile duct: Secondary | ICD-10-CM | POA: Diagnosis not present

## 2021-09-25 DIAGNOSIS — Z5111 Encounter for antineoplastic chemotherapy: Secondary | ICD-10-CM | POA: Diagnosis not present

## 2021-09-25 DIAGNOSIS — C7951 Secondary malignant neoplasm of bone: Secondary | ICD-10-CM | POA: Diagnosis not present

## 2021-09-25 LAB — CBC WITH DIFFERENTIAL (CANCER CENTER ONLY)
Abs Immature Granulocytes: 0.08 10*3/uL — ABNORMAL HIGH (ref 0.00–0.07)
Basophils Absolute: 0 10*3/uL (ref 0.0–0.1)
Basophils Relative: 1 %
Eosinophils Absolute: 0.1 10*3/uL (ref 0.0–0.5)
Eosinophils Relative: 3 %
HCT: 33.1 % — ABNORMAL LOW (ref 36.0–46.0)
Hemoglobin: 10.9 g/dL — ABNORMAL LOW (ref 12.0–15.0)
Immature Granulocytes: 2 %
Lymphocytes Relative: 16 %
Lymphs Abs: 0.6 10*3/uL — ABNORMAL LOW (ref 0.7–4.0)
MCH: 30.1 pg (ref 26.0–34.0)
MCHC: 32.9 g/dL (ref 30.0–36.0)
MCV: 91.4 fL (ref 80.0–100.0)
Monocytes Absolute: 0.5 10*3/uL (ref 0.1–1.0)
Monocytes Relative: 12 %
Neutro Abs: 2.6 10*3/uL (ref 1.7–7.7)
Neutrophils Relative %: 66 %
Platelet Count: 244 10*3/uL (ref 150–400)
RBC: 3.62 MIL/uL — ABNORMAL LOW (ref 3.87–5.11)
RDW: 20.8 % — ABNORMAL HIGH (ref 11.5–15.5)
WBC Count: 3.9 10*3/uL — ABNORMAL LOW (ref 4.0–10.5)
nRBC: 0 % (ref 0.0–0.2)

## 2021-09-25 LAB — CMP (CANCER CENTER ONLY)
ALT: 57 U/L — ABNORMAL HIGH (ref 0–44)
AST: 48 U/L — ABNORMAL HIGH (ref 15–41)
Albumin: 3.4 g/dL — ABNORMAL LOW (ref 3.5–5.0)
Alkaline Phosphatase: 205 U/L — ABNORMAL HIGH (ref 38–126)
Anion gap: 5 (ref 5–15)
BUN: 11 mg/dL (ref 6–20)
CO2: 32 mmol/L (ref 22–32)
Calcium: 8.8 mg/dL — ABNORMAL LOW (ref 8.9–10.3)
Chloride: 103 mmol/L (ref 98–111)
Creatinine: 0.72 mg/dL (ref 0.44–1.00)
GFR, Estimated: >60 mL/min (ref >60)
Glucose, Bld: 109 mg/dL — ABNORMAL HIGH (ref 70–99)
Potassium: 3.6 mmol/L (ref 3.5–5.1)
Sodium: 140 mmol/L (ref 135–145)
Total Bilirubin: 0.3 mg/dL (ref 0.3–1.2)
Total Protein: 6.6 g/dL (ref 6.5–8.1)

## 2021-09-25 MED ORDER — ATROPINE SULFATE 1 MG/ML IV SOLN
0.5000 mg | Freq: Once | INTRAVENOUS | Status: AC | PRN
  Administered 2021-09-25: 0.5 mg via INTRAVENOUS
  Filled 2021-09-25: qty 1

## 2021-09-25 MED ORDER — SODIUM CHLORIDE 0.9% FLUSH
10.0000 mL | Freq: Once | INTRAVENOUS | Status: AC
  Administered 2021-09-25: 10 mL

## 2021-09-25 MED ORDER — PALONOSETRON HCL INJECTION 0.25 MG/5ML
0.2500 mg | Freq: Once | INTRAVENOUS | Status: AC
  Administered 2021-09-25: 0.25 mg via INTRAVENOUS
  Filled 2021-09-25: qty 5

## 2021-09-25 MED ORDER — DENOSUMAB 120 MG/1.7ML ~~LOC~~ SOLN
120.0000 mg | Freq: Once | SUBCUTANEOUS | Status: AC
  Administered 2021-09-25: 120 mg via SUBCUTANEOUS
  Filled 2021-09-25: qty 1.7

## 2021-09-25 MED ORDER — SODIUM CHLORIDE 0.9 % IV SOLN
150.0000 mg | Freq: Once | INTRAVENOUS | Status: AC
  Administered 2021-09-25: 150 mg via INTRAVENOUS
  Filled 2021-09-25: qty 150

## 2021-09-25 MED ORDER — SODIUM CHLORIDE 0.9 % IV SOLN
10.0000 mg | Freq: Once | INTRAVENOUS | Status: AC
  Administered 2021-09-25: 10 mg via INTRAVENOUS
  Filled 2021-09-25: qty 10

## 2021-09-25 MED ORDER — ACETAMINOPHEN 325 MG PO TABS
650.0000 mg | ORAL_TABLET | Freq: Once | ORAL | Status: AC
  Administered 2021-09-25: 650 mg via ORAL
  Filled 2021-09-25: qty 2

## 2021-09-25 MED ORDER — SODIUM CHLORIDE 0.9% FLUSH
10.0000 mL | INTRAVENOUS | Status: DC | PRN
  Administered 2021-09-25: 10 mL

## 2021-09-25 MED ORDER — DIPHENHYDRAMINE HCL 50 MG/ML IJ SOLN
50.0000 mg | Freq: Once | INTRAMUSCULAR | Status: AC
  Administered 2021-09-25: 50 mg via INTRAVENOUS
  Filled 2021-09-25: qty 1

## 2021-09-25 MED ORDER — HEPARIN SOD (PORK) LOCK FLUSH 100 UNIT/ML IV SOLN
500.0000 [IU] | Freq: Once | INTRAVENOUS | Status: AC | PRN
  Administered 2021-09-25: 500 [IU]

## 2021-09-25 MED ORDER — FAMOTIDINE IN NACL 20-0.9 MG/50ML-% IV SOLN
20.0000 mg | Freq: Once | INTRAVENOUS | Status: AC
  Administered 2021-09-25: 20 mg via INTRAVENOUS
  Filled 2021-09-25: qty 50

## 2021-09-25 MED ORDER — SODIUM CHLORIDE 0.9 % IV SOLN: Freq: Once | INTRAVENOUS | Status: AC

## 2021-09-25 MED ORDER — SODIUM CHLORIDE 0.9 % IV SOLN
6.0000 mg/kg | Freq: Once | INTRAVENOUS | Status: AC
  Administered 2021-09-25: 800 mg via INTRAVENOUS
  Filled 2021-09-25: qty 80

## 2021-09-25 NOTE — Progress Notes (Signed)
Pt declined to stay for 30 min post obs, discharged [facility], ambulatory to lobby 

## 2021-09-26 LAB — CANCER ANTIGEN 27.29: CA 27.29: 346.1 U/mL — ABNORMAL HIGH (ref 0.0–38.6)

## 2021-09-27 ENCOUNTER — Other Ambulatory Visit (HOSPITAL_COMMUNITY): Payer: Self-pay

## 2021-09-29 ENCOUNTER — Other Ambulatory Visit (HOSPITAL_COMMUNITY): Payer: Self-pay

## 2021-09-29 NOTE — Progress Notes (Signed)
Patient Care Team: Debbie Pillar, MD as PCP - General (Family Medicine) Debbie Pi, MD as Consulting Physician (Endocrinology) Debbie Kussmaul, MD as Consulting Physician (General Surgery) Debbie Gibson, MD as Attending Physician (Radiation Oncology) Debbie Crome, MD as Consulting Physician (Cardiology) Debbie Bison [name], NP as Nurse Practitioner (Hematology and Oncology) Debbie [name], DMD (Dentistry) Debbie [name], RPH-CPP (Pharmacist) Debbie Lose, MD as Consulting Physician (Hematology and Oncology)  DIAGNOSIS:  Encounter Diagnosis  Name Primary?   Malignant neoplasm of upper-inner quadrant of right breast in female, estrogen receptor positive ([place])     SUMMARY OF ONCOLOGIC HISTORY: Oncology History  Malignant neoplasm of upper-inner quadrant of right breast in female, estrogen receptor positive ([place])  10/15/2015 Initial Biopsy   Right breast upper inner quadrant biopsy: IDC, DCIS, grade 2, ER+(90%), PR+(50%), Ki67 70%, HER-2 negative (ratio 1.18).    10/17/2015 Initial Diagnosis   Malignant neoplasm of upper-inner quadrant of right breast in female, estrogen receptor positive ([place])    10/2015 -  Anti-estrogen oral therapy   Tamoxifen daily    11/28/2015 Surgery   Left breast mammoplasty: IDC, grade 1, 1.2cm, DCIS, lobular neoplasia, T1c, Nx, ER 80%, PR60%, Ki67 3% HER-2 neg Right breast lumpectomy: IDC, grade 1, 2.3cm, DCIS. T2N1a ER/PR+HER-2 neg, Ki 70%.    11/28/2015 Miscellaneous   Mammaprint was Low Risk. Predicting a 98% chance of no disease recurrence within 5 years with anti-estrogens as the only systemic therapy.  It also predicts minimal to no benefit from chemotherapy.      12/26/2015 Surgery   Bilateral mastectomies: bilateral sentinel node sampling, Right: no residual carcinoma, left, focal DCIS, 4 SLN negative    02/11/2016 - 03/27/2016 Radiation Therapy   Adjuvant radiation Debbie [name]): 1) Right chest wall: 50.4 Gy in 28 fractions  2)  Right chest wall boost: 10 Gy in 5 fractions    08/04/2017 Relapse/Recurrence   08/04/2017, MRI of the liver and total spine MRI 08/11/2017 showed liver and bone metastasis; liver biopsy 09/07/2017 confirms estrogen receptor positive, progesterone receptor negative, HER-2 negative metastatic breast cancer; chest CT scan 08/13/2017 shows multiple bilateral pulmonary nodules.   08/17/2017 - 01/04/2018 Anti-estrogen oral therapy   fulvestrant  palbociclib started 08/31/2017 discontinued 01/04/2018 with evidence of progression in the liver   08/2017 Miscellaneous   [facility] report on liver biopsy from April 2019 shows a negative PDL 1, stable MSI and proficient mismatch repair status.  The tumor is positive for the androgen receptor, and for PTEN for PIK3 CA mutations.  HER-2/neu was read as equivocal (it was negative by FISH on the pathology report).   01/04/2018 Miscellaneous   anastrozole started 01/04/2018 everolimus started 01/29/2018   02/16/2018 - 03/01/2018 Radiation Therapy   01/28/18 prophylactic left femur intramedullary nail surgery for impending left femur pathologic fracture alliative radiation with Dr. Isidore [name] to the left femur and left ilium from 02/16/2018 to 03/01/2018   01/30/2020 - 05/2020 Anti-estrogen oral therapy   alpelisib started 01/30/2020 at 300 mg daily with Metformin 500 mg daily and Faslodex (stopped for progression)   05/22/2020 - 08/2020 Chemotherapy   Xeloda discontinued for progression   09/10/2020 - 06/18/2021 Chemotherapy   Patient is on Treatment Plan : BREAST Paclitaxel D1,D15 q28d      07/17/2021 -  Chemotherapy   Patient is on Treatment Plan : BREAST METASTATIC Sacituzumab govitecan-hziy Debbie [name]      Bilateral malignant neoplasm of breast in female, estrogen receptor positive ([place])  12/26/2015 Initial Diagnosis   Bilateral malignant  neoplasm of breast in female, estrogen receptor positive ([place])    07/17/2021 -  Chemotherapy   Patient is on Treatment Plan :  BREAST METASTATIC Sacituzumab govitecan-hziy Debbie [name]      Malignant neoplasm metastatic to liver ([place])  08/13/2017 Initial Diagnosis   Liver metastases ([place])    07/17/2021 -  Chemotherapy   Patient is on Treatment Plan : BREAST METASTATIC Sacituzumab govitecan-hziy Debbie [name]      Malignant neoplasm metastatic to lung ([place])  09/10/2020 Initial Diagnosis   Lung metastases ([place])    07/17/2021 -  Chemotherapy   Patient is on Treatment Plan : BREAST METASTATIC Sacituzumab govitecan-hziy Debbie [name]        CHIEF COMPLIANT: Follow up Debbie [name] cycle 5  INTERVAL HISTORY: Debbie [name] is a 56 year old with above-mentioned history of metastatic breast cancer. She presents to the clinic for a follow-up. States that she had some manageable nausea. States that her taste is still there. Overall she is tolerating the treatment fairly well.  She does have the skin changes on the face which is dry skin and acne as well as discoloration of her palms of her hands.  Neuropathy is stable.  Alopecia is the same as before.   ALLERGIES:  is allergic to ace inhibitors, atorvastatin, simvastatin, and percocet [oxycodone-acetaminophen].  MEDICATIONS:  Current Outpatient Medications  Medication Sig Dispense Refill   acetaminophen (TYLENOL) 500 MG tablet Take 1,000 mg by mouth every 6 (six) hours as needed.     Calcium-Magnesium 250-125 MG TABS Take 1 tablet by mouth daily. 120 each 4   cetirizine (ZYRTEC) 10 MG tablet Take 10 mg by mouth daily as needed for allergies.      chlorhexidine (PERIDEX) 0.12 % solution Use 5 mLs in the mouth or throat 2 (two) times daily as needed as directed. Do not swallow 473 mL 6   diphenoxylate-atropine (LOMOTIL) 2.5-0.025 MG tablet Take 1 tablet by mouth 4 (four) times daily as needed for diarrhea or loose stools. 30 tablet 2   doxycycline (VIBRA-TABS) 100 MG tablet Take 1 tablet (100 mg total) by mouth daily. 30 tablet 2   furosemide (LASIX) 20 MG tablet  Take 1 tablet (20 mg total) by mouth daily. 90 tablet 6   levothyroxine (SYNTHROID) 175 MCG tablet Take 1 tablet (175 mcg total) by mouth in the morning on an empty stomach 30 tablet 6   losartan-hydrochlorothiazide (HYZAAR) 100-25 MG tablet Take 1 tablet by mouth once a day 90 tablet 2   Multiple Vitamin (MULTIVITAMIN) tablet Take 1 tablet by mouth daily.     omega-3 acid ethyl esters (LOVAZA) 1 g capsule Take 1 g by mouth 2 (two) times daily.      omeprazole (PRILOSEC) 40 MG capsule Take 1 capsule (40 mg total) by mouth daily. 30 capsule 3   ondansetron (ZOFRAN-ODT) 8 MG disintegrating tablet Dissolve 1 tablet (8 mg total) by mouth every 8 (eight) hours as needed for nausea or vomiting. 20 tablet 0   oxyCODONE (OXY IR/ROXICODONE) 5 MG immediate release tablet Take 1 tablet (5 mg total) by mouth every 4 (four) hours as needed for severe pain. 60 tablet 0   potassium chloride SA (KLOR-CON) 20 MEQ tablet Take 2 tablets (40 mEq total) by mouth daily. 90 tablet 4   pregabalin (LYRICA) 100 MG capsule Take 1 capsule by mouth 3 times daily. 90 capsule 3   rosuvastatin (CRESTOR) 5 MG tablet Take 1 tablet (5 mg total) by mouth daily. 90 tablet 1  sertraline (ZOLOFT) 100 MG tablet TAKE 1 TABLET BY MOUTH ONCE DAILY 30 tablet 12   valACYclovir (VALTREX) 1000 MG tablet Take 1 tablet (1,000 mg total) by mouth daily. 60 tablet 6   Vitamin D, Cholecalciferol, 1000 units CAPS Take 1 tablet by mouth daily. (Patient taking differently: Take 1,000 Units by mouth daily.) 90 capsule 4   No current facility-administered medications for this visit.    PHYSICAL EXAMINATION: ECOG PERFORMANCE STATUS: 1 - Symptomatic but completely ambulatory  Vitals:   10/09/21 0811  BP: (!) 149/72  Pulse: 87  Resp: 17  Temp: 97.6 F (36.4 C)  SpO2: 94%   Filed Weights   10/09/21 0811  Weight: 295 lb 3.2 oz (133.9 kg)     LABORATORY DATA:  I have reviewed the data as listed    Latest Ref Rng & Units 09/25/2021    8:42  AM 09/18/2021    8:02 AM 09/04/2021    7:57 AM  CMP  Glucose 70 - 99 mg/dL 109   123   119    BUN 6 - 20 mg/dL _0 Creatinine 0.44 - 1.00 mg/dL 0.72   0.75   0.78    Sodium 135 - 145 mmol/L 140   141   140    Potassium 3.5 - 5.1 mmol/L 3.6   3.2   3.3    Chloride 98 - 111 mmol/L 103   102   103    CO2 22 - 32 mmol/L 32   32   31    Calcium 8.9 - 10.3 mg/dL 8.8   9.3   8.9    Total Protein 6.5 - 8.1 g/dL 6.6   6.6   6.4    Total Bilirubin 0.3 - 1.2 mg/dL 0.3   0.4   0.3    Alkaline Phos 38 - 126 U/L 205   184   182    AST 15 - 41 U/L 48   43   37    ALT 0 - 44 U/L 57   64   47      Lab Results  Component Value Date   WBC 4.4 10/09/2021   HGB 11.3 (L) 10/09/2021   HCT 34.1 (L) 10/09/2021   MCV 92.4 10/09/2021   PLT 241 10/09/2021   NEUTROABS 3.1 10/09/2021    ASSESSMENT & PLAN:  Malignant neoplasm of upper-inner quadrant of right breast in female, estrogen receptor positive ([place]) Metastatic Breast Cancer: ER Pos and Her 2 Neg (previously progressed on Ibrance, fulvestrant, everolimus, alpelisib, Xeloda) Prior Treatment: Taxol 09/10/2020-06/18/21 Chronic right arm lymphedema IHC: HER2 0 Current treatment: Palliative treatment with Sacituzumab-Govitecan, today cycle 5   Toxicities: 1.  Neutropenia: We reduced the dosage of Trodelvy with cycle 2 2. Fatigue 3.  Chemotherapy-induced anemia today's hemoglobin is improving and today it is 11.3. 4.  Nausea: Nausea is now mild 5. Constipation alternating with diarrhea 6.  Dryness of the face and acne   CT CAP 09/16/2021: Slight interval decrease in the size of the enlarged pretracheal, subcarinal, right hilar lymph nodes.  Numerous small bilateral lung nodules unchanged.  Numerous lesions in the liver poorly visualized.  Unchanged bone metastases. Tumor markers are also coming down Next scans will be in August   She is super happy about her new grandchild.  Return to clinic for Trodelvy day 1 day 8 every 3 weeks.      No orders of the defined types were  placed in this encounter.  The patient has a good understanding of the overall plan. she agrees with it. she will call with any problems that may develop before the next visit here. Total time spent: 30 mins including face to face time and time spent for planning, charting and co-ordination of care   Harriette Ohara, MD 10/09/21    I Gardiner Coins am scribing for Dr. Lindi Adie  I have reviewed the above documentation for accuracy and completeness, and I agree with the above.

## 2021-10-08 ENCOUNTER — Other Ambulatory Visit (HOSPITAL_COMMUNITY): Payer: Self-pay

## 2021-10-08 MED FILL — Dexamethasone Sodium Phosphate Inj 100 MG/10ML: INTRAMUSCULAR | Qty: 1 | Status: AC

## 2021-10-08 MED FILL — Fosaprepitant Dimeglumine For IV Infusion 150 MG (Base Eq): INTRAVENOUS | Qty: 5 | Status: AC

## 2021-10-09 ENCOUNTER — Inpatient Hospital Stay: Payer: 59 | Attending: Oncology

## 2021-10-09 ENCOUNTER — Other Ambulatory Visit (HOSPITAL_COMMUNITY): Payer: Self-pay

## 2021-10-09 ENCOUNTER — Inpatient Hospital Stay (HOSPITAL_BASED_OUTPATIENT_CLINIC_OR_DEPARTMENT_OTHER): Payer: 59 | Admitting: Hematology and Oncology

## 2021-10-09 ENCOUNTER — Other Ambulatory Visit: Payer: Self-pay

## 2021-10-09 ENCOUNTER — Inpatient Hospital Stay: Payer: 59

## 2021-10-09 DIAGNOSIS — Z9013 Acquired absence of bilateral breasts and nipples: Secondary | ICD-10-CM | POA: Diagnosis not present

## 2021-10-09 DIAGNOSIS — Z170 Estrogen receptor positive status [ER+]: Secondary | ICD-10-CM | POA: Diagnosis not present

## 2021-10-09 DIAGNOSIS — C50211 Malignant neoplasm of upper-inner quadrant of right female breast: Secondary | ICD-10-CM

## 2021-10-09 DIAGNOSIS — D6481 Anemia due to antineoplastic chemotherapy: Secondary | ICD-10-CM | POA: Insufficient documentation

## 2021-10-09 DIAGNOSIS — C787 Secondary malignant neoplasm of liver and intrahepatic bile duct: Secondary | ICD-10-CM | POA: Diagnosis not present

## 2021-10-09 DIAGNOSIS — C7951 Secondary malignant neoplasm of bone: Secondary | ICD-10-CM | POA: Diagnosis not present

## 2021-10-09 DIAGNOSIS — C7800 Secondary malignant neoplasm of unspecified lung: Secondary | ICD-10-CM

## 2021-10-09 DIAGNOSIS — Z5111 Encounter for antineoplastic chemotherapy: Secondary | ICD-10-CM | POA: Diagnosis not present

## 2021-10-09 DIAGNOSIS — Z95828 Presence of other vascular implants and grafts: Secondary | ICD-10-CM

## 2021-10-09 DIAGNOSIS — Z79899 Other long term (current) drug therapy: Secondary | ICD-10-CM | POA: Insufficient documentation

## 2021-10-09 DIAGNOSIS — Z923 Personal history of irradiation: Secondary | ICD-10-CM | POA: Insufficient documentation

## 2021-10-09 LAB — CBC WITH DIFFERENTIAL (CANCER CENTER ONLY)
Abs Immature Granulocytes: 0.03 10*3/uL (ref 0.00–0.07)
Basophils Absolute: 0 10*3/uL (ref 0.0–0.1)
Basophils Relative: 1 %
Eosinophils Absolute: 0.1 10*3/uL (ref 0.0–0.5)
Eosinophils Relative: 3 %
HCT: 34.1 % — ABNORMAL LOW (ref 36.0–46.0)
Hemoglobin: 11.3 g/dL — ABNORMAL LOW (ref 12.0–15.0)
Immature Granulocytes: 1 %
Lymphocytes Relative: 16 %
Lymphs Abs: 0.7 10*3/uL (ref 0.7–4.0)
MCH: 30.6 pg (ref 26.0–34.0)
MCHC: 33.1 g/dL (ref 30.0–36.0)
MCV: 92.4 fL (ref 80.0–100.0)
Monocytes Absolute: 0.4 10*3/uL (ref 0.1–1.0)
Monocytes Relative: 9 %
Neutro Abs: 3.1 10*3/uL (ref 1.7–7.7)
Neutrophils Relative %: 70 %
Platelet Count: 241 10*3/uL (ref 150–400)
RBC: 3.69 MIL/uL — ABNORMAL LOW (ref 3.87–5.11)
RDW: 20.3 % — ABNORMAL HIGH (ref 11.5–15.5)
WBC Count: 4.4 10*3/uL (ref 4.0–10.5)
nRBC: 0 % (ref 0.0–0.2)

## 2021-10-09 LAB — CMP (CANCER CENTER ONLY)
ALT: 73 U/L — ABNORMAL HIGH (ref 0–44)
AST: 52 U/L — ABNORMAL HIGH (ref 15–41)
Albumin: 3.6 g/dL (ref 3.5–5.0)
Alkaline Phosphatase: 209 U/L — ABNORMAL HIGH (ref 38–126)
Anion gap: 7 (ref 5–15)
BUN: 13 mg/dL (ref 6–20)
CO2: 32 mmol/L (ref 22–32)
Calcium: 9.3 mg/dL (ref 8.9–10.3)
Chloride: 102 mmol/L (ref 98–111)
Creatinine: 0.7 mg/dL (ref 0.44–1.00)
GFR, Estimated: >60 mL/min (ref >60)
Glucose, Bld: 110 mg/dL — ABNORMAL HIGH (ref 70–99)
Potassium: 3.6 mmol/L (ref 3.5–5.1)
Sodium: 141 mmol/L (ref 135–145)
Total Bilirubin: 0.4 mg/dL (ref 0.3–1.2)
Total Protein: 6.7 g/dL (ref 6.5–8.1)

## 2021-10-09 MED ORDER — SODIUM CHLORIDE 0.9% FLUSH
10.0000 mL | Freq: Once | INTRAVENOUS | Status: AC
  Administered 2021-10-09: 10 mL

## 2021-10-09 MED ORDER — ATROPINE SULFATE 1 MG/ML IV SOLN
0.5000 mg | Freq: Once | INTRAVENOUS | Status: AC | PRN
  Administered 2021-10-09: 0.5 mg via INTRAVENOUS
  Filled 2021-10-09: qty 1

## 2021-10-09 MED ORDER — HEPARIN SOD (PORK) LOCK FLUSH 100 UNIT/ML IV SOLN
500.0000 [IU] | Freq: Once | INTRAVENOUS | Status: AC | PRN
  Administered 2021-10-09: 500 [IU]

## 2021-10-09 MED ORDER — ACETAMINOPHEN 325 MG PO TABS
650.0000 mg | ORAL_TABLET | Freq: Once | ORAL | Status: AC
  Administered 2021-10-09: 650 mg via ORAL
  Filled 2021-10-09: qty 2

## 2021-10-09 MED ORDER — SODIUM CHLORIDE 0.9 % IV SOLN
150.0000 mg | Freq: Once | INTRAVENOUS | Status: AC
  Administered 2021-10-09: 150 mg via INTRAVENOUS
  Filled 2021-10-09: qty 150

## 2021-10-09 MED ORDER — SODIUM CHLORIDE 0.9 % IV SOLN
10.0000 mg | Freq: Once | INTRAVENOUS | Status: AC
  Administered 2021-10-09: 10 mg via INTRAVENOUS
  Filled 2021-10-09: qty 10

## 2021-10-09 MED ORDER — SODIUM CHLORIDE 0.9% FLUSH
10.0000 mL | INTRAVENOUS | Status: DC | PRN
  Administered 2021-10-09: 10 mL

## 2021-10-09 MED ORDER — PALONOSETRON HCL INJECTION 0.25 MG/5ML
0.2500 mg | Freq: Once | INTRAVENOUS | Status: AC
  Administered 2021-10-09: 0.25 mg via INTRAVENOUS
  Filled 2021-10-09: qty 5

## 2021-10-09 MED ORDER — SODIUM CHLORIDE 0.9 % IV SOLN: Freq: Once | INTRAVENOUS | Status: AC

## 2021-10-09 MED ORDER — DIPHENHYDRAMINE HCL 50 MG/ML IJ SOLN
50.0000 mg | Freq: Once | INTRAMUSCULAR | Status: AC
  Administered 2021-10-09: 50 mg via INTRAVENOUS
  Filled 2021-10-09: qty 1

## 2021-10-09 MED ORDER — SODIUM CHLORIDE 0.9 % IV SOLN
6.0000 mg/kg | Freq: Once | INTRAVENOUS | Status: AC
  Administered 2021-10-09: 800 mg via INTRAVENOUS
  Filled 2021-10-09: qty 80

## 2021-10-09 MED ORDER — FAMOTIDINE IN NACL 20-0.9 MG/50ML-% IV SOLN
20.0000 mg | Freq: Once | INTRAVENOUS | Status: AC
  Administered 2021-10-09: 20 mg via INTRAVENOUS
  Filled 2021-10-09: qty 50

## 2021-10-09 MED ORDER — CHLORHEXIDINE GLUCONATE 0.12 % MT SOLN
5.0000 mL | Freq: Two times a day (BID) | OROMUCOSAL | 6 refills | Status: DC | PRN
  Filled 2021-10-09: qty 473, 47d supply, fill #0

## 2021-10-09 NOTE — Assessment & Plan Note (Addendum)
Metastatic Breast Cancer: ER Pos and Her 2Neg(previously progressed on Ibrance, fulvestrant, everolimus, alpelisib, Xeloda) Prior Treatment: Taxol 09/10/2020-06/18/21 Chronic right arm lymphedema IHC: HER2 0 Current treatment:Palliative treatment with Sacituzumab-Govitecan, today cycle5  Toxicities: 1.Neutropenia: We reducedthe dosage of Trodelvy with cycle 2 2.Fatigue 3.Chemotherapy-induced anemia today's hemoglobin is 10.9 4.Nausea:Nausea is now mild 5.Constipation alternating with diarrhea  CT CAP 09/16/2021: Slight interval decrease in the size of the enlarged pretracheal, subcarinal, right hilar lymph nodes.  Numerous small bilateral lung nodules unchanged.  Numerous lesions in the liver poorly visualized.  Unchanged bone metastases. Tumor markers are also coming down Next scans will be in August  She is super happy about her new grandchild.  Return to clinic for Trodelvy day 1 day 8 every 3 weeks. 

## 2021-10-10 ENCOUNTER — Other Ambulatory Visit (HOSPITAL_COMMUNITY): Payer: Self-pay

## 2021-10-10 ENCOUNTER — Telehealth: Payer: Self-pay | Admitting: Hematology and Oncology

## 2021-10-10 LAB — CANCER ANTIGEN 27.29: CA 27.29: 334.2 U/mL — ABNORMAL HIGH (ref 0.0–38.6)

## 2021-10-10 NOTE — Telephone Encounter (Signed)
Scheduled appointment per 6/1 los. Patient is aware.

## 2021-10-15 MED FILL — Dexamethasone Sodium Phosphate Inj 100 MG/10ML: INTRAMUSCULAR | Qty: 1 | Status: AC

## 2021-10-15 MED FILL — Fosaprepitant Dimeglumine For IV Infusion 150 MG (Base Eq): INTRAVENOUS | Qty: 5 | Status: AC

## 2021-10-16 ENCOUNTER — Other Ambulatory Visit (HOSPITAL_COMMUNITY): Payer: Self-pay

## 2021-10-16 ENCOUNTER — Inpatient Hospital Stay: Payer: 59

## 2021-10-16 VITALS — BP 148/80 | HR 94 | Temp 98.2°F | Resp 18 | Wt 293.5 lb

## 2021-10-16 DIAGNOSIS — C50111 Malignant neoplasm of central portion of right female breast: Secondary | ICD-10-CM

## 2021-10-16 DIAGNOSIS — C7800 Secondary malignant neoplasm of unspecified lung: Secondary | ICD-10-CM

## 2021-10-16 DIAGNOSIS — Z79899 Other long term (current) drug therapy: Secondary | ICD-10-CM | POA: Diagnosis not present

## 2021-10-16 DIAGNOSIS — C787 Secondary malignant neoplasm of liver and intrahepatic bile duct: Secondary | ICD-10-CM

## 2021-10-16 DIAGNOSIS — C50211 Malignant neoplasm of upper-inner quadrant of right female breast: Secondary | ICD-10-CM | POA: Diagnosis not present

## 2021-10-16 DIAGNOSIS — Z170 Estrogen receptor positive status [ER+]: Secondary | ICD-10-CM

## 2021-10-16 DIAGNOSIS — Z5111 Encounter for antineoplastic chemotherapy: Secondary | ICD-10-CM | POA: Diagnosis not present

## 2021-10-16 DIAGNOSIS — Z923 Personal history of irradiation: Secondary | ICD-10-CM | POA: Diagnosis not present

## 2021-10-16 DIAGNOSIS — Z9013 Acquired absence of bilateral breasts and nipples: Secondary | ICD-10-CM | POA: Diagnosis not present

## 2021-10-16 DIAGNOSIS — Z95828 Presence of other vascular implants and grafts: Secondary | ICD-10-CM

## 2021-10-16 DIAGNOSIS — D6481 Anemia due to antineoplastic chemotherapy: Secondary | ICD-10-CM | POA: Diagnosis not present

## 2021-10-16 DIAGNOSIS — C7951 Secondary malignant neoplasm of bone: Secondary | ICD-10-CM | POA: Diagnosis not present

## 2021-10-16 LAB — CMP (CANCER CENTER ONLY)
ALT: 56 U/L — ABNORMAL HIGH (ref 0–44)
AST: 41 U/L (ref 15–41)
Albumin: 3.5 g/dL (ref 3.5–5.0)
Alkaline Phosphatase: 199 U/L — ABNORMAL HIGH (ref 38–126)
Anion gap: 9 (ref 5–15)
BUN: 13 mg/dL (ref 6–20)
CO2: 29 mmol/L (ref 22–32)
Calcium: 9.1 mg/dL (ref 8.9–10.3)
Chloride: 102 mmol/L (ref 98–111)
Creatinine: 0.83 mg/dL (ref 0.44–1.00)
GFR, Estimated: >60 mL/min (ref >60)
Glucose, Bld: 123 mg/dL — ABNORMAL HIGH (ref 70–99)
Potassium: 3.4 mmol/L — ABNORMAL LOW (ref 3.5–5.1)
Sodium: 140 mmol/L (ref 135–145)
Total Bilirubin: 0.4 mg/dL (ref 0.3–1.2)
Total Protein: 6.5 g/dL (ref 6.5–8.1)

## 2021-10-16 LAB — CBC WITH DIFFERENTIAL (CANCER CENTER ONLY)
Abs Immature Granulocytes: 0.07 10*3/uL (ref 0.00–0.07)
Basophils Absolute: 0 10*3/uL (ref 0.0–0.1)
Basophils Relative: 1 %
Eosinophils Absolute: 0.1 10*3/uL (ref 0.0–0.5)
Eosinophils Relative: 3 %
HCT: 32.5 % — ABNORMAL LOW (ref 36.0–46.0)
Hemoglobin: 10.8 g/dL — ABNORMAL LOW (ref 12.0–15.0)
Immature Granulocytes: 2 %
Lymphocytes Relative: 16 %
Lymphs Abs: 0.5 10*3/uL — ABNORMAL LOW (ref 0.7–4.0)
MCH: 30.6 pg (ref 26.0–34.0)
MCHC: 33.2 g/dL (ref 30.0–36.0)
MCV: 92.1 fL (ref 80.0–100.0)
Monocytes Absolute: 0.5 10*3/uL (ref 0.1–1.0)
Monocytes Relative: 14 %
Neutro Abs: 2.3 10*3/uL (ref 1.7–7.7)
Neutrophils Relative %: 64 %
Platelet Count: 233 10*3/uL (ref 150–400)
RBC: 3.53 MIL/uL — ABNORMAL LOW (ref 3.87–5.11)
RDW: 19.9 % — ABNORMAL HIGH (ref 11.5–15.5)
WBC Count: 3.5 10*3/uL — ABNORMAL LOW (ref 4.0–10.5)
nRBC: 0.6 % — ABNORMAL HIGH (ref 0.0–0.2)

## 2021-10-16 MED ORDER — SODIUM CHLORIDE 0.9 % IV SOLN
150.0000 mg | Freq: Once | INTRAVENOUS | Status: AC
  Administered 2021-10-16: 150 mg via INTRAVENOUS
  Filled 2021-10-16: qty 150

## 2021-10-16 MED ORDER — SODIUM CHLORIDE 0.9 % IV SOLN
6.0000 mg/kg | Freq: Once | INTRAVENOUS | Status: AC
  Administered 2021-10-16: 800 mg via INTRAVENOUS
  Filled 2021-10-16: qty 80

## 2021-10-16 MED ORDER — DIPHENHYDRAMINE HCL 50 MG/ML IJ SOLN
50.0000 mg | Freq: Once | INTRAMUSCULAR | Status: AC
  Administered 2021-10-16: 50 mg via INTRAVENOUS
  Filled 2021-10-16: qty 1

## 2021-10-16 MED ORDER — SODIUM CHLORIDE 0.9% FLUSH
10.0000 mL | Freq: Once | INTRAVENOUS | Status: AC
  Administered 2021-10-16: 10 mL

## 2021-10-16 MED ORDER — FAMOTIDINE IN NACL 20-0.9 MG/50ML-% IV SOLN
20.0000 mg | Freq: Once | INTRAVENOUS | Status: AC
  Administered 2021-10-16: 20 mg via INTRAVENOUS
  Filled 2021-10-16: qty 50

## 2021-10-16 MED ORDER — ACETAMINOPHEN 325 MG PO TABS
650.0000 mg | ORAL_TABLET | Freq: Once | ORAL | Status: AC
  Administered 2021-10-16: 650 mg via ORAL
  Filled 2021-10-16: qty 2

## 2021-10-16 MED ORDER — PALONOSETRON HCL INJECTION 0.25 MG/5ML
0.2500 mg | Freq: Once | INTRAVENOUS | Status: AC
  Administered 2021-10-16: 0.25 mg via INTRAVENOUS
  Filled 2021-10-16: qty 5

## 2021-10-16 MED ORDER — SODIUM CHLORIDE 0.9% FLUSH
10.0000 mL | INTRAVENOUS | Status: DC | PRN
  Administered 2021-10-16: 10 mL

## 2021-10-16 MED ORDER — SODIUM CHLORIDE 0.9 % IV SOLN
10.0000 mg | Freq: Once | INTRAVENOUS | Status: AC
  Administered 2021-10-16: 10 mg via INTRAVENOUS
  Filled 2021-10-16: qty 10

## 2021-10-16 MED ORDER — ATROPINE SULFATE 1 MG/ML IV SOLN
0.5000 mg | Freq: Once | INTRAVENOUS | Status: AC | PRN
  Administered 2021-10-16: 0.5 mg via INTRAVENOUS
  Filled 2021-10-16: qty 1

## 2021-10-16 MED ORDER — SODIUM CHLORIDE 0.9 % IV SOLN: Freq: Once | INTRAVENOUS | Status: AC

## 2021-10-16 MED ORDER — HEPARIN SOD (PORK) LOCK FLUSH 100 UNIT/ML IV SOLN
500.0000 [IU] | Freq: Once | INTRAVENOUS | Status: AC | PRN
  Administered 2021-10-16: 500 [IU]

## 2021-10-16 NOTE — Progress Notes (Signed)
Patient Care Team: Kelton Pillar, MD as PCP - General (Family Medicine) Jacelyn Pi, MD as Consulting Physician (Endocrinology) Jovita Kussmaul, MD as Consulting Physician (General Surgery) [name], MD as Attending Physician (Radiation Oncology) Belva Crome, MD as Consulting Physician (Cardiology) [name] [name], NP as Nurse Practitioner (Hematology and Oncology) Lajoyce Lauber, DMD (Dentistry) Raina Mina, RPH-CPP (Pharmacist) Nicholas Lose, MD as Consulting Physician (Hematology and Oncology)  DIAGNOSIS:  Encounter Diagnosis  Name Primary?   Malignant neoplasm of upper-inner quadrant of right breast in female, estrogen receptor positive ([place])     SUMMARY OF ONCOLOGIC HISTORY: Oncology History  Malignant neoplasm of upper-inner quadrant of right breast in female, estrogen receptor positive ([place])  10/15/2015 Initial Biopsy   Right breast upper inner quadrant biopsy: IDC, DCIS, grade 2, ER+(90%), PR+(50%), Ki67 70%, HER-2 negative (ratio 1.18).   10/17/2015 Initial Diagnosis   Malignant neoplasm of upper-inner quadrant of right breast in female, estrogen receptor positive ([place])   10/2015 -  Anti-estrogen oral therapy   Tamoxifen daily   11/28/2015 Surgery   Left breast mammoplasty: IDC, grade 1, 1.2cm, DCIS, lobular neoplasia, T1c, Nx, ER 80%, PR60%, Ki67 3% HER-2 neg Right breast lumpectomy: IDC, grade 1, 2.3cm, DCIS. T2N1a ER/PR+HER-2 neg, Ki 70%.   11/28/2015 Miscellaneous   Mammaprint was Low Risk. Predicting a 98% chance of no disease recurrence within 5 years with anti-estrogens as the only systemic therapy.  It also predicts minimal to no benefit from chemotherapy.     12/26/2015 Surgery   Bilateral mastectomies: bilateral sentinel node sampling, Right: no residual carcinoma, left, focal DCIS, 4 SLN negative   02/11/2016 - 03/27/2016 Radiation Therapy   Adjuvant radiation [name]): 1) Right chest wall: 50.4 Gy in 28 fractions  2) Right chest  wall boost: 10 Gy in 5 fractions   08/04/2017 Relapse/Recurrence   08/04/2017, MRI of the liver and total spine MRI 08/11/2017 showed liver and bone metastasis; liver biopsy 09/07/2017 confirms estrogen receptor positive, progesterone receptor negative, HER-2 negative metastatic breast cancer; chest CT scan 08/13/2017 shows multiple bilateral pulmonary nodules.   08/17/2017 - 01/04/2018 Anti-estrogen oral therapy   fulvestrant  palbociclib started 08/31/2017 discontinued 01/04/2018 with evidence of progression in the liver   08/2017 Miscellaneous   [facility] report on liver biopsy from April 2019 shows a negative PDL 1, stable MSI and proficient mismatch repair status.  The tumor is positive for the androgen receptor, and for PTEN for PIK3 CA mutations.  HER-2/neu was read as equivocal (it was negative by FISH on the pathology report).   01/04/2018 Miscellaneous   anastrozole started 01/04/2018 everolimus started 01/29/2018   02/16/2018 - 03/01/2018 Radiation Therapy   01/28/18 prophylactic left femur intramedullary nail surgery for impending left femur pathologic fracture alliative radiation with Dr. [name] to the left femur and left ilium from 02/16/2018 to 03/01/2018   01/30/2020 - 05/2020 Anti-estrogen oral therapy   alpelisib started 01/30/2020 at 300 mg daily with Metformin 500 mg daily and Faslodex (stopped for progression)   05/22/2020 - 08/2020 Chemotherapy   Xeloda discontinued for progression   09/10/2020 - 06/18/2021 Chemotherapy   Patient is on Treatment Plan : BREAST Paclitaxel D1,D15 q28d     07/17/2021 -  Chemotherapy   Patient is on Treatment Plan : BREAST METASTATIC Sacituzumab govitecan-hziy Debbie [name]) q21d     Bilateral malignant neoplasm of breast in female, estrogen receptor positive ([place])  12/26/2015 Initial Diagnosis   Bilateral malignant neoplasm of breast in female, estrogen receptor positive ([place])  07/17/2021 -  Chemotherapy   Patient is on Treatment Plan : BREAST METASTATIC  Sacituzumab govitecan-hziy Debbie [name]) q21d     Malignant neoplasm metastatic to liver ([place])  08/13/2017 Initial Diagnosis   Liver metastases ([place])   07/17/2021 -  Chemotherapy   Patient is on Treatment Plan : BREAST METASTATIC Sacituzumab govitecan-hziy Debbie [name]) q21d     Malignant neoplasm metastatic to lung ([place])  09/10/2020 Initial Diagnosis   Lung metastases ([place])   07/17/2021 -  Chemotherapy   Patient is on Treatment Plan : BREAST METASTATIC Sacituzumab govitecan-hziy Debbie [name]) q21d       CHIEF COMPLIANT: Follow up Debbie [name] cycle 6  INTERVAL HISTORY: Debbie [name] is a 56 year old with above-mentioned history of metastatic breast cancer. She presents to the clinic for a follow-up. States that she notice in the last 2 weeks when she walks she gets shortness of breath. States that she has had some jaw pain. Complains of a cough states that it feels like phlegm. States when she coughs it feels better. States that she has no appetite. States when she eat something she does get some nausea. States that her taste buds is still off.   ALLERGIES:  is allergic to ace inhibitors, atorvastatin, simvastatin, and percocet [oxycodone-acetaminophen].  MEDICATIONS:  Current Outpatient Medications  Medication Sig Dispense Refill   acetaminophen (TYLENOL) 500 MG tablet Take 1,000 mg by mouth every 6 (six) hours as needed.     Calcium-Magnesium 250-125 MG TABS Take 1 tablet by mouth daily. 120 each 4   cetirizine (ZYRTEC) 10 MG tablet Take 10 mg by mouth daily as needed for allergies.      chlorhexidine (PERIDEX) 0.12 % solution Use 5 mLs in the mouth or throat 2 (two) times daily as needed as directed. *Do not swallow* 473 mL 6   diphenoxylate-atropine (LOMOTIL) 2.5-0.025 MG tablet Take 1 tablet by mouth 4 (four) times daily as needed for diarrhea or loose stools. 30 tablet 2   doxycycline (VIBRA-TABS) 100 MG tablet Take 1 tablet (100 mg total) by mouth daily. 30 tablet 2   furosemide (LASIX) 20  MG tablet Take 1 tablet (20 mg total) by mouth daily. 90 tablet 6   levothyroxine (SYNTHROID) 175 MCG tablet Take 1 tablet (175 mcg total) by mouth in the morning on an empty stomach 30 tablet 6   losartan-hydrochlorothiazide (HYZAAR) 100-25 MG tablet Take 1 tablet by mouth once a day 90 tablet 2   Multiple Vitamin (MULTIVITAMIN) tablet Take 1 tablet by mouth daily.     omega-3 acid ethyl esters (LOVAZA) 1 g capsule Take 1 g by mouth 2 (two) times daily.      omeprazole (PRILOSEC) 40 MG capsule Take 1 capsule (40 mg total) by mouth daily. 30 capsule 3   ondansetron (ZOFRAN-ODT) 8 MG disintegrating tablet Dissolve 1 tablet (8 mg total) by mouth every 8 (eight) hours as needed for nausea or vomiting. 20 tablet 0   oxyCODONE (OXY IR/ROXICODONE) 5 MG immediate release tablet Take 1 tablet (5 mg total) by mouth every 4 (four) hours as needed for severe pain. 60 tablet 0   potassium chloride SA (KLOR-CON M) 20 MEQ tablet Take 2 tablets (40 mEq total) by mouth daily. 90 tablet 4   pregabalin (LYRICA) 100 MG capsule Take 1 capsule by mouth 3 times daily. 90 capsule 3   rosuvastatin (CRESTOR) 5 MG tablet Take 1 tablet (5 mg total) by mouth daily. 90 tablet 1   sertraline (ZOLOFT) 100 MG tablet TAKE 1 TABLET  BY MOUTH ONCE DAILY 30 tablet 12   valACYclovir (VALTREX) 1000 MG tablet Take 1 tablet (1,000 mg total) by mouth daily. 60 tablet 6   Vitamin D, Cholecalciferol, 1000 units CAPS Take 1 tablet by mouth daily. (Patient taking differently: Take 1,000 Units by mouth daily.) 90 capsule 4   No current facility-administered medications for this visit.    PHYSICAL EXAMINATION: ECOG PERFORMANCE STATUS: 1 - Symptomatic but completely ambulatory  Vitals:   10/30/21 0906  BP: (!) 167/76  Pulse: 65  Resp: 18  Temp: (!) 97.2 F (36.2 C)  SpO2: 98%   Filed Weights   10/30/21 0906  Weight: 286 lb 9.6 oz (130 kg)      LABORATORY DATA:  I have reviewed the data as listed    Latest Ref Rng & Units  10/16/2021    7:36 AM 10/09/2021    8:02 AM 09/25/2021    8:42 AM  CMP  Glucose 70 - 99 mg/dL 123  110  109   BUN 6 - 20 mg/dL _0 Creatinine 0.44 - 1.00 mg/dL 0.83  0.70  0.72   Sodium 135 - 145 mmol/L 140  141  140   Potassium 3.5 - 5.1 mmol/L 3.4  3.6  3.6   Chloride 98 - 111 mmol/L 102  102  103   CO2 22 - 32 mmol/L 29  32  32   Calcium 8.9 - 10.3 mg/dL 9.1  9.3  8.8   Total Protein 6.5 - 8.1 g/dL 6.5  6.7  6.6   Total Bilirubin 0.3 - 1.2 mg/dL 0.4  0.4  0.3   Alkaline Phos 38 - 126 U/L 199  209  205   AST 15 - 41 U/L 41  52  48   ALT 0 - 44 U/L 56  73  57     Lab Results  Component Value Date   WBC 3.4 (L) 10/30/2021   HGB 11.0 (L) 10/30/2021   HCT 33.6 (L) 10/30/2021   MCV 93.6 10/30/2021   PLT 230 10/30/2021   NEUTROABS 2.3 10/30/2021    ASSESSMENT & PLAN:  Malignant neoplasm of upper-inner quadrant of right breast in female, estrogen receptor positive ([place]) Metastatic Breast Cancer: ER Pos and Her 2 Neg (previously progressed on Ibrance, fulvestrant, everolimus, alpelisib, Xeloda) Prior Treatment: Taxol 09/10/2020-06/18/21 Chronic right arm lymphedema IHC: HER2 0 ----------------------------------------------------------------------------------------------------------------------- Current treatment: Palliative treatment with Sacituzumab-Govitecan, today cycle 6   Toxicities: 1.  Neutropenia: We reduced the dosage of Trodelvy with cycle 2 2. Fatigue 3.  Chemotherapy-induced anemia today's hemoglobin is improving and today it is 11.3. 4.  Nausea: Nausea is now mild 5. Constipation alternating with diarrhea 6.  Dryness of the face and acne 7.  Dry cough with walking: Encouraged her to take Pepcid twice a day to see if it is acid reflux related versus Claritin.    CT CAP 09/16/2021: Slight interval decrease in the size of the enlarged pretracheal, subcarinal, right hilar lymph nodes.  Numerous small bilateral lung nodules unchanged.  Numerous lesions in the liver  poorly visualized.  Unchanged bone metastases. Tumor markers are also coming down Next scans will be in August 22nd   She is super happy about her new grandchild.   Return to clinic for Trodelvy day 1 day 8 every 3 weeks.       Orders Placed This Encounter  Procedures   CT CHEST ABDOMEN PELVIS W CONTRAST    Standing Status:  Future    Standing Expiration Date:   10/31/2022    Order Specific Question:   Is patient pregnant?    Answer:   No    Order Specific Question:   Preferred imaging location?    Answer:   [facility]    Order Specific Question:   Release to patient    Answer:   Immediate    Order Specific Question:   Is Oral Contrast requested for this exam?    Answer:   Yes, Per Radiology protocol   CA 27.29    Standing Status:   Standing    Number of Occurrences:   100    Standing Expiration Date:   10/31/2022   The patient has a good understanding of the overall plan. she agrees with it. she will call with any problems that may develop before the next visit here. Total time spent: 30 mins including face to face time and time spent for planning, charting and co-ordination of care   Harriette Ohara, MD 10/30/21  I Gardiner Coins am scribing for Dr. Lindi Adie  I have reviewed the above documentation for accuracy and completeness, and I agree with the above.

## 2021-10-16 NOTE — Progress Notes (Signed)
Patient stayed for her 30 minute observation period. VSS- BP (!) 148/80 (BP Location: Left Arm, Patient Position: Sitting)   Pulse 94   Temp 98.2 F (36.8 C) (Oral)   Resp 18   Wt 293 lb 8 oz (133.1 kg)   LMP 12/15/2015   SpO2 98%   BMI 47.37 kg/m  Patient left ambulatory with no complaints.

## 2021-10-16 NOTE — Patient Instructions (Signed)
[facility]   Discharge Instructions: Thank you for choosing [facility] to provide your oncology and hematology care.   If you have a lab appointment with the [facility], please go directly to the [facility] and check in at the registration area.   Wear comfortable clothing and clothing appropriate for easy access to any Portacath or PICC line.   We strive to give you quality time with your provider. You may need to reschedule your appointment if you arrive late (15 or more minutes).  Arriving late affects you and other patients whose appointments are after yours.  Also, if you miss three or more appointments without notifying the office, you may be dismissed from the clinic at the provider's discretion.      For prescription refill requests, have your pharmacy contact our office and allow 72 hours for refills to be completed.    Today you received the following chemotherapy and/or immunotherapy agents: sacituzumab-govitecan   To help prevent nausea and vomiting after your treatment, we encourage you to take your nausea medication as directed.  BELOW ARE SYMPTOMS THAT SHOULD BE REPORTED IMMEDIATELY: *FEVER GREATER THAN 100.4 F (38 C) OR HIGHER *CHILLS OR SWEATING *NAUSEA AND VOMITING THAT IS NOT CONTROLLED WITH YOUR NAUSEA MEDICATION *UNUSUAL SHORTNESS OF BREATH *UNUSUAL BRUISING OR BLEEDING *URINARY PROBLEMS (pain or burning when urinating, or frequent urination) *BOWEL PROBLEMS (unusual diarrhea, constipation, pain near the anus) TENDERNESS IN MOUTH AND THROAT WITH OR WITHOUT PRESENCE OF ULCERS (sore throat, sores in mouth, or a toothache) UNUSUAL RASH, SWELLING OR PAIN  UNUSUAL VAGINAL DISCHARGE OR ITCHING   Items with * indicate a potential emergency and should be followed up as soon as possible or go to the Emergency Department if any problems should occur.  Please show the CHEMOTHERAPY ALERT CARD or IMMUNOTHERAPY ALERT CARD at  check-in to the Emergency Department and triage nurse.  Should you have questions after your visit or need to cancel or reschedule your appointment, please contact [facility]  Dept: [phone]  and follow the prompts.  Office hours are 8:00 a.m. to 4:30 p.m. Monday - Friday. Please note that voicemails left after 4:00 p.m. may not be returned until the following business day.  We are closed weekends and major holidays. You have access to a nurse at all times for urgent questions. Please call the main number to the clinic Dept: [phone] and follow the prompts.   For any non-urgent questions, you may also contact your provider using MyChart. We now offer e-Visits for anyone 54 and older to request care online for non-urgent symptoms. For details visit [email or web address].   Also download the MyChart app! Go to the app store, search "MyChart", open the app, select [facility], and log in with your MyChart username and password.  Due to Covid, a mask is required upon entering the hospital/clinic. If you do not have a mask, one will be given to you upon arrival. For doctor visits, patients may have 1 support person aged 60 or older with them. For treatment visits, patients cannot have anyone with them due to current Covid guidelines and our immunocompromised population.

## 2021-10-29 ENCOUNTER — Other Ambulatory Visit: Payer: Self-pay | Admitting: *Deleted

## 2021-10-29 DIAGNOSIS — C787 Secondary malignant neoplasm of liver and intrahepatic bile duct: Secondary | ICD-10-CM

## 2021-10-29 MED FILL — Fosaprepitant Dimeglumine For IV Infusion 150 MG (Base Eq): INTRAVENOUS | Qty: 5 | Status: AC

## 2021-10-29 MED FILL — Dexamethasone Sodium Phosphate Inj 100 MG/10ML: INTRAMUSCULAR | Qty: 1 | Status: AC

## 2021-10-30 ENCOUNTER — Other Ambulatory Visit: Payer: Self-pay

## 2021-10-30 ENCOUNTER — Other Ambulatory Visit (HOSPITAL_COMMUNITY): Payer: Self-pay

## 2021-10-30 ENCOUNTER — Inpatient Hospital Stay (HOSPITAL_BASED_OUTPATIENT_CLINIC_OR_DEPARTMENT_OTHER): Payer: 59 | Admitting: Hematology and Oncology

## 2021-10-30 ENCOUNTER — Inpatient Hospital Stay: Payer: 59

## 2021-10-30 VITALS — BP 127/70 | HR 79 | Temp 98.3°F | Resp 18

## 2021-10-30 DIAGNOSIS — C787 Secondary malignant neoplasm of liver and intrahepatic bile duct: Secondary | ICD-10-CM

## 2021-10-30 DIAGNOSIS — C50211 Malignant neoplasm of upper-inner quadrant of right female breast: Secondary | ICD-10-CM

## 2021-10-30 DIAGNOSIS — Z95828 Presence of other vascular implants and grafts: Secondary | ICD-10-CM

## 2021-10-30 DIAGNOSIS — C7951 Secondary malignant neoplasm of bone: Secondary | ICD-10-CM | POA: Diagnosis not present

## 2021-10-30 DIAGNOSIS — C7800 Secondary malignant neoplasm of unspecified lung: Secondary | ICD-10-CM

## 2021-10-30 DIAGNOSIS — Z923 Personal history of irradiation: Secondary | ICD-10-CM | POA: Diagnosis not present

## 2021-10-30 DIAGNOSIS — Z5111 Encounter for antineoplastic chemotherapy: Secondary | ICD-10-CM | POA: Diagnosis not present

## 2021-10-30 DIAGNOSIS — Z9013 Acquired absence of bilateral breasts and nipples: Secondary | ICD-10-CM | POA: Diagnosis not present

## 2021-10-30 DIAGNOSIS — C50112 Malignant neoplasm of central portion of left female breast: Secondary | ICD-10-CM

## 2021-10-30 DIAGNOSIS — Z170 Estrogen receptor positive status [ER+]: Secondary | ICD-10-CM | POA: Diagnosis not present

## 2021-10-30 DIAGNOSIS — D6481 Anemia due to antineoplastic chemotherapy: Secondary | ICD-10-CM | POA: Diagnosis not present

## 2021-10-30 DIAGNOSIS — Z79899 Other long term (current) drug therapy: Secondary | ICD-10-CM | POA: Diagnosis not present

## 2021-10-30 LAB — CBC WITH DIFFERENTIAL (CANCER CENTER ONLY)
Abs Immature Granulocytes: 0.01 10*3/uL (ref 0.00–0.07)
Basophils Absolute: 0 10*3/uL (ref 0.0–0.1)
Basophils Relative: 0 %
Eosinophils Absolute: 0.1 10*3/uL (ref 0.0–0.5)
Eosinophils Relative: 3 %
HCT: 33.6 % — ABNORMAL LOW (ref 36.0–46.0)
Hemoglobin: 11 g/dL — ABNORMAL LOW (ref 12.0–15.0)
Immature Granulocytes: 0 %
Lymphocytes Relative: 16 %
Lymphs Abs: 0.5 10*3/uL — ABNORMAL LOW (ref 0.7–4.0)
MCH: 30.6 pg (ref 26.0–34.0)
MCHC: 32.7 g/dL (ref 30.0–36.0)
MCV: 93.6 fL (ref 80.0–100.0)
Monocytes Absolute: 0.4 10*3/uL (ref 0.1–1.0)
Monocytes Relative: 12 %
Neutro Abs: 2.3 10*3/uL (ref 1.7–7.7)
Neutrophils Relative %: 69 %
Platelet Count: 230 10*3/uL (ref 150–400)
RBC: 3.59 MIL/uL — ABNORMAL LOW (ref 3.87–5.11)
RDW: 19.9 % — ABNORMAL HIGH (ref 11.5–15.5)
WBC Count: 3.4 10*3/uL — ABNORMAL LOW (ref 4.0–10.5)
nRBC: 0 % (ref 0.0–0.2)

## 2021-10-30 LAB — CMP (CANCER CENTER ONLY)
ALT: 27 U/L (ref 0–44)
AST: 29 U/L (ref 15–41)
Albumin: 3.5 g/dL (ref 3.5–5.0)
Alkaline Phosphatase: 148 U/L — ABNORMAL HIGH (ref 38–126)
Anion gap: 6 (ref 5–15)
BUN: 12 mg/dL (ref 6–20)
CO2: 33 mmol/L — ABNORMAL HIGH (ref 22–32)
Calcium: 9 mg/dL (ref 8.9–10.3)
Chloride: 102 mmol/L (ref 98–111)
Creatinine: 0.8 mg/dL (ref 0.44–1.00)
GFR, Estimated: >60 mL/min (ref >60)
Glucose, Bld: 96 mg/dL (ref 70–99)
Potassium: 3.1 mmol/L — ABNORMAL LOW (ref 3.5–5.1)
Sodium: 141 mmol/L (ref 135–145)
Total Bilirubin: 0.4 mg/dL (ref 0.3–1.2)
Total Protein: 6.9 g/dL (ref 6.5–8.1)

## 2021-10-30 MED ORDER — HEPARIN SOD (PORK) LOCK FLUSH 100 UNIT/ML IV SOLN
500.0000 [IU] | Freq: Once | INTRAVENOUS | Status: AC | PRN
  Administered 2021-10-30: 500 [IU]

## 2021-10-30 MED ORDER — SODIUM CHLORIDE 0.9% FLUSH
10.0000 mL | INTRAVENOUS | Status: DC | PRN
  Administered 2021-10-30: 10 mL

## 2021-10-30 MED ORDER — ACETAMINOPHEN 325 MG PO TABS
650.0000 mg | ORAL_TABLET | Freq: Once | ORAL | Status: AC
  Administered 2021-10-30: 650 mg via ORAL
  Filled 2021-10-30: qty 2

## 2021-10-30 MED ORDER — SODIUM CHLORIDE 0.9 % IV SOLN
150.0000 mg | Freq: Once | INTRAVENOUS | Status: AC
  Administered 2021-10-30: 150 mg via INTRAVENOUS
  Filled 2021-10-30: qty 150

## 2021-10-30 MED ORDER — FAMOTIDINE IN NACL 20-0.9 MG/50ML-% IV SOLN
20.0000 mg | Freq: Once | INTRAVENOUS | Status: AC
  Administered 2021-10-30: 20 mg via INTRAVENOUS
  Filled 2021-10-30: qty 50

## 2021-10-30 MED ORDER — SODIUM CHLORIDE 0.9 % IV SOLN: Freq: Once | INTRAVENOUS | Status: AC

## 2021-10-30 MED ORDER — SODIUM CHLORIDE 0.9 % IV SOLN
10.0000 mg | Freq: Once | INTRAVENOUS | Status: AC
  Administered 2021-10-30: 10 mg via INTRAVENOUS
  Filled 2021-10-30: qty 10

## 2021-10-30 MED ORDER — SODIUM CHLORIDE 0.9 % IV SOLN
6.0000 mg/kg | Freq: Once | INTRAVENOUS | Status: AC
  Administered 2021-10-30: 800 mg via INTRAVENOUS
  Filled 2021-10-30: qty 80

## 2021-10-30 MED ORDER — ATROPINE SULFATE 1 MG/ML IV SOLN
0.5000 mg | Freq: Once | INTRAVENOUS | Status: AC | PRN
  Administered 2021-10-30: 0.5 mg via INTRAVENOUS
  Filled 2021-10-30: qty 1

## 2021-10-30 MED ORDER — DENOSUMAB 120 MG/1.7ML ~~LOC~~ SOLN
120.0000 mg | Freq: Once | SUBCUTANEOUS | Status: AC
  Administered 2021-10-30: 120 mg via SUBCUTANEOUS
  Filled 2021-10-30: qty 1.7

## 2021-10-30 MED ORDER — DIPHENHYDRAMINE HCL 50 MG/ML IJ SOLN
50.0000 mg | Freq: Once | INTRAMUSCULAR | Status: AC
  Administered 2021-10-30: 50 mg via INTRAVENOUS
  Filled 2021-10-30: qty 1

## 2021-10-30 MED ORDER — PALONOSETRON HCL INJECTION 0.25 MG/5ML
0.2500 mg | Freq: Once | INTRAVENOUS | Status: AC
  Administered 2021-10-30: 0.25 mg via INTRAVENOUS
  Filled 2021-10-30: qty 5

## 2021-10-30 MED ORDER — SODIUM CHLORIDE 0.9% FLUSH
10.0000 mL | Freq: Once | INTRAVENOUS | Status: AC
  Administered 2021-10-30: 10 mL

## 2021-10-30 NOTE — Assessment & Plan Note (Addendum)
Metastatic Breast Cancer: ER Pos and Her 2Neg(previously progressed on Ibrance, fulvestrant, everolimus, alpelisib, Xeloda) Prior Treatment: Taxol 09/10/2020-06/18/21 Chronic right arm lymphedema IHC: HER2 0 ----------------------------------------------------------------------------------------------------------------------- Current treatment:Palliative treatment with Sacituzumab-Govitecan, today cycle6  Toxicities: 1.Neutropenia: We reducedthe dosage of Trodelvy with cycle 2 2.Fatigue 3.Chemotherapy-induced anemia today's hemoglobin is improving and today it is 11.3. 4.Nausea:Nausea is now mild 5.Constipation alternating with diarrhea 6.  Dryness of the face and acne 7.  Dry cough with walking: Encouraged her to take Pepcid twice a day to see if it is acid reflux related versus Claritin.   CT CAP 09/16/2021: Slight interval decrease in the size of the enlarged pretracheal, subcarinal, right hilar lymph nodes. Numerous small bilateral lung nodules unchanged. Numerous lesions in the liver poorly visualized. Unchanged bone metastases. Tumor markers are also coming down Next scans will be in August 22nd  She is super happy about her new grandchild.  Return to clinic for Trodelvy day 1 day 8 every 3 weeks.

## 2021-10-30 NOTE — Patient Instructions (Signed)
[facility]   Discharge Instructions: Thank you for choosing [facility] to provide your oncology and hematology care.   If you have a lab appointment with the [facility], please go directly to the [name] and check in at the registration area.   Wear comfortable clothing and clothing appropriate for easy access to any Portacath or PICC line.   We strive to give you quality time with your provider. You may need to reschedule your appointment if you arrive late (15 or more minutes).  Arriving late affects you and other patients whose appointments are after yours.  Also, if you miss three or more appointments without notifying the office, you may be dismissed from the clinic at the provider's discretion.      For prescription refill requests, have your pharmacy contact our office and allow 72 hours for refills to be completed.    Today you received the following chemotherapy and/or immunotherapy agents: sacituzumab-govitecan   To help prevent nausea and vomiting after your treatment, we encourage you to take your nausea medication as directed.  BELOW ARE SYMPTOMS THAT SHOULD BE REPORTED IMMEDIATELY: *FEVER GREATER THAN 100.4 F (38 C) OR HIGHER *CHILLS OR SWEATING *NAUSEA AND VOMITING THAT IS NOT CONTROLLED WITH YOUR NAUSEA MEDICATION *UNUSUAL SHORTNESS OF BREATH *UNUSUAL BRUISING OR BLEEDING *URINARY PROBLEMS (pain or burning when urinating, or frequent urination) *BOWEL PROBLEMS (unusual diarrhea, constipation, pain near the anus) TENDERNESS IN MOUTH AND THROAT WITH OR WITHOUT PRESENCE OF ULCERS (sore throat, sores in mouth, or a toothache) UNUSUAL RASH, SWELLING OR PAIN  UNUSUAL VAGINAL DISCHARGE OR ITCHING   Items with * indicate a potential emergency and should be followed up as soon as possible or go to the Emergency Department if any problems should occur.  Please show the CHEMOTHERAPY ALERT CARD or IMMUNOTHERAPY ALERT CARD at  check-in to the Emergency Department and triage nurse.  Should you have questions after your visit or need to cancel or reschedule your appointment, please contact [facility]  Dept: [phone]  and follow the prompts.  Office hours are 8:00 a.m. to 4:30 p.m. Monday - Friday. Please note that voicemails left after 4:00 p.m. may not be returned until the following business day.  We are closed weekends and major holidays. You have access to a nurse at all times for urgent questions. Please call the main number to the clinic Dept: [phone] and follow the prompts.   For any non-urgent questions, you may also contact your provider using MyChart. We now offer e-Visits for anyone 24 and older to request care online for non-urgent symptoms. For details visit [email or web address].   Also download the MyChart app! Go to the app store, search "MyChart", open the app, select [facility], and log in with your MyChart username and password.  Due to Covid, a mask is required upon entering the hospital/clinic. If you do not have a mask, one will be given to you upon arrival. For doctor visits, patients may have 1 support Debbie [name] aged 58 or older with them. For treatment visits, patients cannot have anyone with them due to current Covid guidelines and our immunocompromised population.

## 2021-10-31 ENCOUNTER — Telehealth: Payer: Self-pay | Admitting: Hematology and Oncology

## 2021-10-31 LAB — CANCER ANTIGEN 27.29: CA 27.29: 256.9 U/mL — ABNORMAL HIGH (ref 0.0–38.6)

## 2021-10-31 NOTE — Telephone Encounter (Signed)
Scheduled appointment per 6/22 los. Patient is aware.

## 2021-11-05 ENCOUNTER — Other Ambulatory Visit (HOSPITAL_COMMUNITY): Payer: Self-pay

## 2021-11-05 MED FILL — Dexamethasone Sodium Phosphate Inj 100 MG/10ML: INTRAMUSCULAR | Qty: 1 | Status: AC

## 2021-11-05 MED FILL — Fosaprepitant Dimeglumine For IV Infusion 150 MG (Base Eq): INTRAVENOUS | Qty: 5 | Status: AC

## 2021-11-06 ENCOUNTER — Inpatient Hospital Stay: Payer: 59

## 2021-11-06 ENCOUNTER — Other Ambulatory Visit: Payer: Self-pay

## 2021-11-06 ENCOUNTER — Other Ambulatory Visit (HOSPITAL_COMMUNITY): Payer: Self-pay

## 2021-11-06 VITALS — BP 149/75 | HR 82 | Temp 98.0°F | Resp 18 | Wt 284.5 lb

## 2021-11-06 DIAGNOSIS — Z95828 Presence of other vascular implants and grafts: Secondary | ICD-10-CM

## 2021-11-06 DIAGNOSIS — C50211 Malignant neoplasm of upper-inner quadrant of right female breast: Secondary | ICD-10-CM

## 2021-11-06 DIAGNOSIS — C7800 Secondary malignant neoplasm of unspecified lung: Secondary | ICD-10-CM

## 2021-11-06 DIAGNOSIS — Z79899 Other long term (current) drug therapy: Secondary | ICD-10-CM | POA: Diagnosis not present

## 2021-11-06 DIAGNOSIS — C7951 Secondary malignant neoplasm of bone: Secondary | ICD-10-CM | POA: Diagnosis not present

## 2021-11-06 DIAGNOSIS — D6481 Anemia due to antineoplastic chemotherapy: Secondary | ICD-10-CM | POA: Diagnosis not present

## 2021-11-06 DIAGNOSIS — C787 Secondary malignant neoplasm of liver and intrahepatic bile duct: Secondary | ICD-10-CM | POA: Diagnosis not present

## 2021-11-06 DIAGNOSIS — Z923 Personal history of irradiation: Secondary | ICD-10-CM | POA: Diagnosis not present

## 2021-11-06 DIAGNOSIS — Z9013 Acquired absence of bilateral breasts and nipples: Secondary | ICD-10-CM | POA: Diagnosis not present

## 2021-11-06 DIAGNOSIS — Z5111 Encounter for antineoplastic chemotherapy: Secondary | ICD-10-CM | POA: Diagnosis not present

## 2021-11-06 DIAGNOSIS — Z170 Estrogen receptor positive status [ER+]: Secondary | ICD-10-CM

## 2021-11-06 LAB — CBC WITH DIFFERENTIAL (CANCER CENTER ONLY)
Abs Immature Granulocytes: 0.02 10*3/uL (ref 0.00–0.07)
Basophils Absolute: 0 10*3/uL (ref 0.0–0.1)
Basophils Relative: 1 %
Eosinophils Absolute: 0.1 10*3/uL (ref 0.0–0.5)
Eosinophils Relative: 3 %
HCT: 32.3 % — ABNORMAL LOW (ref 36.0–46.0)
Hemoglobin: 10.9 g/dL — ABNORMAL LOW (ref 12.0–15.0)
Immature Granulocytes: 1 %
Lymphocytes Relative: 19 %
Lymphs Abs: 0.6 10*3/uL — ABNORMAL LOW (ref 0.7–4.0)
MCH: 31 pg (ref 26.0–34.0)
MCHC: 33.7 g/dL (ref 30.0–36.0)
MCV: 91.8 fL (ref 80.0–100.0)
Monocytes Absolute: 0.5 10*3/uL (ref 0.1–1.0)
Monocytes Relative: 15 %
Neutro Abs: 1.8 10*3/uL (ref 1.7–7.7)
Neutrophils Relative %: 61 %
Platelet Count: 255 10*3/uL (ref 150–400)
RBC: 3.52 MIL/uL — ABNORMAL LOW (ref 3.87–5.11)
RDW: 18.5 % — ABNORMAL HIGH (ref 11.5–15.5)
WBC Count: 3 10*3/uL — ABNORMAL LOW (ref 4.0–10.5)
nRBC: 0.7 % — ABNORMAL HIGH (ref 0.0–0.2)

## 2021-11-06 LAB — CMP (CANCER CENTER ONLY)
ALT: 32 U/L (ref 0–44)
AST: 29 U/L (ref 15–41)
Albumin: 3.6 g/dL (ref 3.5–5.0)
Alkaline Phosphatase: 159 U/L — ABNORMAL HIGH (ref 38–126)
Anion gap: 6 (ref 5–15)
BUN: 13 mg/dL (ref 6–20)
CO2: 33 mmol/L — ABNORMAL HIGH (ref 22–32)
Calcium: 8.9 mg/dL (ref 8.9–10.3)
Chloride: 101 mmol/L (ref 98–111)
Creatinine: 0.77 mg/dL (ref 0.44–1.00)
GFR, Estimated: >60 mL/min (ref >60)
Glucose, Bld: 108 mg/dL — ABNORMAL HIGH (ref 70–99)
Potassium: 2.9 mmol/L — ABNORMAL LOW (ref 3.5–5.1)
Sodium: 140 mmol/L (ref 135–145)
Total Bilirubin: 0.4 mg/dL (ref 0.3–1.2)
Total Protein: 6.7 g/dL (ref 6.5–8.1)

## 2021-11-06 MED ORDER — SODIUM CHLORIDE 0.9 % IV SOLN
10.0000 mg | Freq: Once | INTRAVENOUS | Status: AC
  Administered 2021-11-06: 10 mg via INTRAVENOUS
  Filled 2021-11-06: qty 10

## 2021-11-06 MED ORDER — FAMOTIDINE IN NACL 20-0.9 MG/50ML-% IV SOLN
20.0000 mg | Freq: Once | INTRAVENOUS | Status: AC
  Administered 2021-11-06: 20 mg via INTRAVENOUS
  Filled 2021-11-06: qty 50

## 2021-11-06 MED ORDER — SODIUM CHLORIDE 0.9 % IV SOLN: Freq: Once | INTRAVENOUS | Status: AC

## 2021-11-06 MED ORDER — SODIUM CHLORIDE 0.9 % IV SOLN
6.0000 mg/kg | Freq: Once | INTRAVENOUS | Status: AC
  Administered 2021-11-06: 800 mg via INTRAVENOUS
  Filled 2021-11-06: qty 80

## 2021-11-06 MED ORDER — AMOXICILLIN-POT CLAVULANATE 875-125 MG PO TABS
1.0000 | ORAL_TABLET | Freq: Two times a day (BID) | ORAL | 0 refills | Status: DC
  Filled 2021-11-06: qty 14, 7d supply, fill #0

## 2021-11-06 MED ORDER — ACETAMINOPHEN 325 MG PO TABS
650.0000 mg | ORAL_TABLET | Freq: Once | ORAL | Status: AC
  Administered 2021-11-06: 650 mg via ORAL
  Filled 2021-11-06: qty 2

## 2021-11-06 MED ORDER — SODIUM CHLORIDE 0.9% FLUSH
10.0000 mL | Freq: Once | INTRAVENOUS | Status: AC
  Administered 2021-11-06: 10 mL

## 2021-11-06 MED ORDER — SODIUM CHLORIDE 0.9 % IV SOLN
150.0000 mg | Freq: Once | INTRAVENOUS | Status: AC
  Administered 2021-11-06: 150 mg via INTRAVENOUS
  Filled 2021-11-06: qty 150

## 2021-11-06 MED ORDER — POTASSIUM CHLORIDE 20 MEQ/15ML (10%) PO SOLN
40.0000 meq | Freq: Every day | ORAL | 0 refills | Status: DC
Start: 2021-11-06 — End: 2021-12-02
  Filled 2021-11-06: qty 473, 15d supply, fill #0

## 2021-11-06 MED ORDER — DIPHENHYDRAMINE HCL 50 MG/ML IJ SOLN
50.0000 mg | Freq: Once | INTRAMUSCULAR | Status: AC
  Administered 2021-11-06: 50 mg via INTRAVENOUS
  Filled 2021-11-06: qty 1

## 2021-11-06 MED ORDER — PALONOSETRON HCL INJECTION 0.25 MG/5ML
0.2500 mg | Freq: Once | INTRAVENOUS | Status: AC
  Administered 2021-11-06: 0.25 mg via INTRAVENOUS
  Filled 2021-11-06: qty 5

## 2021-11-06 MED ORDER — ATROPINE SULFATE 1 MG/ML IV SOLN
0.5000 mg | Freq: Once | INTRAVENOUS | Status: AC | PRN
  Administered 2021-11-06: 0.5 mg via INTRAVENOUS
  Filled 2021-11-06: qty 1

## 2021-11-06 NOTE — Patient Instructions (Signed)
[facility]  Discharge Instructions: Thank you for choosing [facility] to provide your oncology and hematology care.   If you have a lab appointment with the [facility], please go directly to the [facility] and check in at the registration area.   Wear comfortable clothing and clothing appropriate for easy access to any Portacath or PICC line.   We strive to give you quality time with your provider. You may need to reschedule your appointment if you arrive late (15 or more minutes).  Arriving late affects you and other patients whose appointments are after yours.  Also, if you miss three or more appointments without notifying the office, you may be dismissed from the clinic at the provider's discretion.      For prescription refill requests, have your pharmacy contact our office and allow 72 hours for refills to be completed.    Today you received the following chemotherapy and/or immunotherapy agents trodelvy      To help prevent nausea and vomiting after your treatment, we encourage you to take your nausea medication as directed.  BELOW ARE SYMPTOMS THAT SHOULD BE REPORTED IMMEDIATELY: *FEVER GREATER THAN 100.4 F (38 C) OR HIGHER *CHILLS OR SWEATING *NAUSEA AND VOMITING THAT IS NOT CONTROLLED WITH YOUR NAUSEA MEDICATION *UNUSUAL SHORTNESS OF BREATH *UNUSUAL BRUISING OR BLEEDING *URINARY PROBLEMS (pain or burning when urinating, or frequent urination) *BOWEL PROBLEMS (unusual diarrhea, constipation, pain near the anus) TENDERNESS IN MOUTH AND THROAT WITH OR WITHOUT PRESENCE OF ULCERS (sore throat, sores in mouth, or a toothache) UNUSUAL RASH, SWELLING OR PAIN  UNUSUAL VAGINAL DISCHARGE OR ITCHING   Items with * indicate a potential emergency and should be followed up as soon as possible or go to the Emergency Department if any problems should occur.  Please show the CHEMOTHERAPY ALERT CARD or IMMUNOTHERAPY ALERT CARD at check-in to  the Emergency Department and triage nurse.  Should you have questions after your visit or need to cancel or reschedule your appointment, please contact [facility]  Dept: [phone]  and follow the prompts.  Office hours are 8:00 a.m. to 4:30 p.m. Monday - Friday. Please note that voicemails left after 4:00 p.m. may not be returned until the following business day.  We are closed weekends and major holidays. You have access to a nurse at all times for urgent questions. Please call the main number to the clinic Dept: [phone] and follow the prompts.   For any non-urgent questions, you may also contact your provider using MyChart. We now offer e-Visits for anyone 32 and older to request care online for non-urgent symptoms. For details visit [email or web address].   Also download the MyChart app! Go to the app store, search "MyChart", open the app, select [facility], and log in with your MyChart username and password.  Masks are optional in the cancer centers. If you would like for your care team to wear a mask while they are taking care of you, please let them know. For doctor visits, patients may have with them one support person who is at least 58 years old. At this time, visitors are not allowed in the infusion area.

## 2021-11-06 NOTE — Progress Notes (Signed)
Pt complains of dental pain to infusion RN, per secure chat with NP, sent in Rx for Augmentin.  Additionally, potassium was changed to liquid form and sent in as well.

## 2021-11-07 LAB — CANCER ANTIGEN 27.29: CA 27.29: 248.7 U/mL — ABNORMAL HIGH (ref 0.0–38.6)

## 2021-11-13 ENCOUNTER — Other Ambulatory Visit (HOSPITAL_COMMUNITY): Payer: Self-pay

## 2021-11-13 DIAGNOSIS — E890 Postprocedural hypothyroidism: Secondary | ICD-10-CM | POA: Diagnosis not present

## 2021-11-13 DIAGNOSIS — R7301 Impaired fasting glucose: Secondary | ICD-10-CM | POA: Diagnosis not present

## 2021-11-19 ENCOUNTER — Other Ambulatory Visit (HOSPITAL_COMMUNITY): Payer: Self-pay

## 2021-11-19 MED FILL — Fosaprepitant Dimeglumine For IV Infusion 150 MG (Base Eq): INTRAVENOUS | Qty: 5 | Status: AC

## 2021-11-19 MED FILL — Dexamethasone Sodium Phosphate Inj 100 MG/10ML: INTRAMUSCULAR | Qty: 1 | Status: AC

## 2021-11-20 ENCOUNTER — Encounter: Payer: Self-pay | Admitting: Adult Health

## 2021-11-20 ENCOUNTER — Inpatient Hospital Stay: Payer: 59

## 2021-11-20 ENCOUNTER — Other Ambulatory Visit (HOSPITAL_COMMUNITY): Payer: Self-pay

## 2021-11-20 ENCOUNTER — Inpatient Hospital Stay: Payer: 59 | Admitting: Adult Health

## 2021-11-20 ENCOUNTER — Other Ambulatory Visit: Payer: Self-pay

## 2021-11-20 ENCOUNTER — Inpatient Hospital Stay: Payer: 59 | Attending: Oncology

## 2021-11-20 ENCOUNTER — Inpatient Hospital Stay (HOSPITAL_BASED_OUTPATIENT_CLINIC_OR_DEPARTMENT_OTHER): Payer: 59 | Admitting: Adult Health

## 2021-11-20 VITALS — BP 145/66 | HR 86 | Temp 98.1°F | Resp 18 | Ht 66.0 in | Wt 292.5 lb

## 2021-11-20 VITALS — BP 144/74 | HR 98 | Temp 98.6°F | Resp 17

## 2021-11-20 DIAGNOSIS — Z5111 Encounter for antineoplastic chemotherapy: Secondary | ICD-10-CM | POA: Insufficient documentation

## 2021-11-20 DIAGNOSIS — C7951 Secondary malignant neoplasm of bone: Secondary | ICD-10-CM | POA: Diagnosis not present

## 2021-11-20 DIAGNOSIS — Z9013 Acquired absence of bilateral breasts and nipples: Secondary | ICD-10-CM | POA: Diagnosis not present

## 2021-11-20 DIAGNOSIS — E1165 Type 2 diabetes mellitus with hyperglycemia: Secondary | ICD-10-CM | POA: Diagnosis not present

## 2021-11-20 DIAGNOSIS — E890 Postprocedural hypothyroidism: Secondary | ICD-10-CM | POA: Diagnosis not present

## 2021-11-20 DIAGNOSIS — Z95828 Presence of other vascular implants and grafts: Secondary | ICD-10-CM

## 2021-11-20 DIAGNOSIS — C7800 Secondary malignant neoplasm of unspecified lung: Secondary | ICD-10-CM | POA: Diagnosis not present

## 2021-11-20 DIAGNOSIS — Z170 Estrogen receptor positive status [ER+]: Secondary | ICD-10-CM | POA: Diagnosis not present

## 2021-11-20 DIAGNOSIS — C787 Secondary malignant neoplasm of liver and intrahepatic bile duct: Secondary | ICD-10-CM

## 2021-11-20 DIAGNOSIS — C50211 Malignant neoplasm of upper-inner quadrant of right female breast: Secondary | ICD-10-CM | POA: Diagnosis not present

## 2021-11-20 DIAGNOSIS — I10 Essential (primary) hypertension: Secondary | ICD-10-CM | POA: Diagnosis not present

## 2021-11-20 DIAGNOSIS — Z79899 Other long term (current) drug therapy: Secondary | ICD-10-CM | POA: Diagnosis not present

## 2021-11-20 LAB — CBC WITH DIFFERENTIAL (CANCER CENTER ONLY)
Abs Immature Granulocytes: 0.03 10*3/uL (ref 0.00–0.07)
Basophils Absolute: 0 10*3/uL (ref 0.0–0.1)
Basophils Relative: 0 %
Eosinophils Absolute: 0.1 10*3/uL (ref 0.0–0.5)
Eosinophils Relative: 3 %
HCT: 32.4 % — ABNORMAL LOW (ref 36.0–46.0)
Hemoglobin: 10.6 g/dL — ABNORMAL LOW (ref 12.0–15.0)
Immature Granulocytes: 1 %
Lymphocytes Relative: 13 %
Lymphs Abs: 0.5 10*3/uL — ABNORMAL LOW (ref 0.7–4.0)
MCH: 30.8 pg (ref 26.0–34.0)
MCHC: 32.7 g/dL (ref 30.0–36.0)
MCV: 94.2 fL (ref 80.0–100.0)
Monocytes Absolute: 0.4 10*3/uL (ref 0.1–1.0)
Monocytes Relative: 10 %
Neutro Abs: 2.9 10*3/uL (ref 1.7–7.7)
Neutrophils Relative %: 73 %
Platelet Count: 258 10*3/uL (ref 150–400)
RBC: 3.44 MIL/uL — ABNORMAL LOW (ref 3.87–5.11)
RDW: 18.5 % — ABNORMAL HIGH (ref 11.5–15.5)
WBC Count: 4 10*3/uL (ref 4.0–10.5)
nRBC: 0 % (ref 0.0–0.2)

## 2021-11-20 LAB — CMP (CANCER CENTER ONLY)
ALT: 48 U/L — ABNORMAL HIGH (ref 0–44)
AST: 39 U/L (ref 15–41)
Albumin: 3.5 g/dL (ref 3.5–5.0)
Alkaline Phosphatase: 194 U/L — ABNORMAL HIGH (ref 38–126)
Anion gap: 6 (ref 5–15)
BUN: 10 mg/dL (ref 6–20)
CO2: 30 mmol/L (ref 22–32)
Calcium: 9.1 mg/dL (ref 8.9–10.3)
Chloride: 105 mmol/L (ref 98–111)
Creatinine: 0.88 mg/dL (ref 0.44–1.00)
GFR, Estimated: >60 mL/min (ref >60)
Glucose, Bld: 124 mg/dL — ABNORMAL HIGH (ref 70–99)
Potassium: 3.4 mmol/L — ABNORMAL LOW (ref 3.5–5.1)
Sodium: 141 mmol/L (ref 135–145)
Total Bilirubin: 0.3 mg/dL (ref 0.3–1.2)
Total Protein: 6.6 g/dL (ref 6.5–8.1)

## 2021-11-20 MED ORDER — DIPHENHYDRAMINE HCL 50 MG/ML IJ SOLN
50.0000 mg | Freq: Once | INTRAMUSCULAR | Status: AC
  Administered 2021-11-20: 50 mg via INTRAVENOUS
  Filled 2021-11-20: qty 1

## 2021-11-20 MED ORDER — FLUCONAZOLE 100 MG PO TABS
100.0000 mg | ORAL_TABLET | Freq: Every day | ORAL | 0 refills | Status: DC
  Filled 2021-11-20: qty 7, 7d supply, fill #0

## 2021-11-20 MED ORDER — SODIUM CHLORIDE 0.9% FLUSH
10.0000 mL | INTRAVENOUS | Status: DC | PRN
  Administered 2021-11-20: 10 mL

## 2021-11-20 MED ORDER — SODIUM CHLORIDE 0.9 % IV SOLN
10.0000 mg | Freq: Once | INTRAVENOUS | Status: AC
  Administered 2021-11-20: 10 mg via INTRAVENOUS
  Filled 2021-11-20: qty 10

## 2021-11-20 MED ORDER — HEPARIN SOD (PORK) LOCK FLUSH 100 UNIT/ML IV SOLN
500.0000 [IU] | Freq: Once | INTRAVENOUS | Status: AC | PRN
  Administered 2021-11-20: 500 [IU]

## 2021-11-20 MED ORDER — FAMOTIDINE IN NACL 20-0.9 MG/50ML-% IV SOLN
20.0000 mg | Freq: Once | INTRAVENOUS | Status: AC
  Administered 2021-11-20: 20 mg via INTRAVENOUS
  Filled 2021-11-20: qty 50

## 2021-11-20 MED ORDER — SODIUM CHLORIDE 0.9% FLUSH
10.0000 mL | Freq: Once | INTRAVENOUS | Status: AC
  Administered 2021-11-20: 10 mL

## 2021-11-20 MED ORDER — SODIUM CHLORIDE 0.9 % IV SOLN
150.0000 mg | Freq: Once | INTRAVENOUS | Status: AC
  Administered 2021-11-20: 150 mg via INTRAVENOUS
  Filled 2021-11-20: qty 150

## 2021-11-20 MED ORDER — PALONOSETRON HCL INJECTION 0.25 MG/5ML
0.2500 mg | Freq: Once | INTRAVENOUS | Status: AC
  Administered 2021-11-20: 0.25 mg via INTRAVENOUS
  Filled 2021-11-20: qty 5

## 2021-11-20 MED ORDER — ATROPINE SULFATE 1 MG/ML IV SOLN
0.5000 mg | Freq: Once | INTRAVENOUS | Status: AC | PRN
  Administered 2021-11-20: 0.5 mg via INTRAVENOUS
  Filled 2021-11-20: qty 1

## 2021-11-20 MED ORDER — SODIUM CHLORIDE 0.9 % IV SOLN
6.0000 mg/kg | Freq: Once | INTRAVENOUS | Status: AC
  Administered 2021-11-20: 800 mg via INTRAVENOUS
  Filled 2021-11-20: qty 80

## 2021-11-20 MED ORDER — METFORMIN HCL ER 500 MG PO TB24
ORAL_TABLET | ORAL | 1 refills | Status: DC
Start: 2021-11-20 — End: 2022-05-21
  Filled 2021-11-20: qty 90, 90d supply, fill #0
  Filled 2022-02-17: qty 90, 90d supply, fill #1

## 2021-11-20 MED ORDER — SODIUM CHLORIDE 0.9 % IV SOLN: Freq: Once | INTRAVENOUS | Status: AC

## 2021-11-20 MED ORDER — ROSUVASTATIN CALCIUM 5 MG PO TABS
5.0000 mg | ORAL_TABLET | Freq: Every day | ORAL | 1 refills | Status: DC
  Filled 2021-11-20: qty 90, 90d supply, fill #0
  Filled 2022-02-17: qty 90, 90d supply, fill #1

## 2021-11-20 MED ORDER — ACETAMINOPHEN 325 MG PO TABS
650.0000 mg | ORAL_TABLET | Freq: Once | ORAL | Status: AC
  Administered 2021-11-20: 650 mg via ORAL
  Filled 2021-11-20: qty 2

## 2021-11-20 NOTE — Assessment & Plan Note (Addendum)
Metastatic Breast Cancer: ER Pos and Her 2Neg(previously progressed on Ibrance, fulvestrant, everolimus, alpelisib, Xeloda) Prior Treatment: Taxol 09/10/2020-06/18/21 Chronic right arm lymphedema IHC: HER2 0 ----------------------------------------------------------------------------------------------------------------------- Current treatment:Palliative treatment with Sacituzumab-Govitecan, today cycle6  Toxicities: 1.Neutropenia: We reducedthe dosage of Trodelvy with cycle 2 2.Fatigue-mild, patient continues to work.   3.Chemotherapy-induced anemia is stable 4.Nausea:resolved 5.Constipation alternating with diarrhea-unchanged 6.  Dryness of the face and acne 7.  Dry cough with walking: Resolved with pepcid  Jeidi is doing well today and she has no clinical signs of progression of her metastatic breast cancer.  She is tolerating [name] well and will proceed with this so long as her c-Met is within parameters which is currently pending.  She does have symptoms of vaginal candida therefore I sent in Diflucan to her pharmacy.  We will see her back as noted below.   Return to clinic for Trodelvy day 1 day 8 every 3 weeks.

## 2021-11-20 NOTE — Patient Instructions (Signed)
[facility]  Discharge Instructions: Thank you for choosing [facility] to provide your oncology and hematology care.   If you have a lab appointment with the [facility], please go directly to the [facility] and check in at the registration area.   Wear comfortable clothing and clothing appropriate for easy access to any Portacath or PICC line.   We strive to give you quality time with your provider. You may need to reschedule your appointment if you arrive late (15 or more minutes).  Arriving late affects you and other patients whose appointments are after yours.  Also, if you miss three or more appointments without notifying the office, you may be dismissed from the clinic at the provider's discretion.      For prescription refill requests, have your pharmacy contact our office and allow 72 hours for refills to be completed.    Today you received the following chemotherapy and/or immunotherapy agents: [name]      To help prevent nausea and vomiting after your treatment, we encourage you to take your nausea medication as directed.  BELOW ARE SYMPTOMS THAT SHOULD BE REPORTED IMMEDIATELY: *FEVER GREATER THAN 100.4 F (38 C) OR HIGHER *CHILLS OR SWEATING *NAUSEA AND VOMITING THAT IS NOT CONTROLLED WITH YOUR NAUSEA MEDICATION *UNUSUAL SHORTNESS OF BREATH *UNUSUAL BRUISING OR BLEEDING *URINARY PROBLEMS (pain or burning when urinating, or frequent urination) *BOWEL PROBLEMS (unusual diarrhea, constipation, pain near the anus) TENDERNESS IN MOUTH AND THROAT WITH OR WITHOUT PRESENCE OF ULCERS (sore throat, sores in mouth, or a toothache) UNUSUAL RASH, SWELLING OR PAIN  UNUSUAL VAGINAL DISCHARGE OR ITCHING   Items with * indicate a potential emergency and should be followed up as soon as possible or go to the Emergency Department if any problems should occur.  Please show the CHEMOTHERAPY ALERT CARD or IMMUNOTHERAPY ALERT CARD at check-in to  the Emergency Department and triage nurse.  Should you have questions after your visit or need to cancel or reschedule your appointment, please contact [facility]  Dept: [phone]  and follow the prompts.  Office hours are 8:00 a.m. to 4:30 p.m. Monday - Friday. Please note that voicemails left after 4:00 p.m. may not be returned until the following business day.  We are closed weekends and major holidays. You have access to a nurse at all times for urgent questions. Please call the main number to the clinic Dept: [phone] and follow the prompts.   For any non-urgent questions, you may also contact your provider using MyChart. We now offer e-Visits for anyone 30 and older to request care online for non-urgent symptoms. For details visit [email or web address].   Also download the MyChart app! Go to the app store, search "MyChart", open the app, select [facility], and log in with your MyChart username and password.  Masks are optional in the cancer centers. If you would like for your care team to wear a mask while they are taking care of you, please let them know. For doctor visits, patients may have with them one support person who is at least 58 years old. At this time, visitors are not allowed in the infusion area.

## 2021-11-20 NOTE — Progress Notes (Signed)
[facility] Follow up:    Kelton Pillar, MD [address] [place] [address]   DIAGNOSIS:  Cancer Staging  Malignant neoplasm of central portion of left breast in female, estrogen receptor positive ([place]) Staging form: Breast, AJCC 7th Edition - Pathologic: Stage IA (T1c, N0, cM0) - Unsigned - Pathologic: Stage IV (M1) - Unsigned  Malignant neoplasm of upper-inner quadrant of right breast in female, estrogen receptor positive ([place]) Staging form: Breast, AJCC 7th Edition - Clinical stage from 10/23/2015: Stage IIA (T2, N0, M0) - Unsigned Staged by: Pathologist and managing physician Laterality: Right Estrogen receptor status: Positive Progesterone receptor status: Positive HER2 status: Negative Stage used in treatment planning: Yes National guidelines used in treatment planning: Yes Type of national guideline used in treatment planning: NCCN - Pathologic: Stage IIB (T2, N1, cM0) - Unsigned - Pathologic: Stage IV (M1) - Unsigned   SUMMARY OF ONCOLOGIC HISTORY: Oncology History  Malignant neoplasm of upper-inner quadrant of right breast in female, estrogen receptor positive ([place])  10/15/2015 Initial Biopsy   Right breast upper inner quadrant biopsy: IDC, DCIS, grade 2, ER+(90%), PR+(50%), Ki67 70%, HER-2 negative (ratio 1.18).   10/17/2015 Initial Diagnosis   Malignant neoplasm of upper-inner quadrant of right breast in female, estrogen receptor positive ([place])   10/2015 -  Anti-estrogen oral therapy   Tamoxifen daily   11/28/2015 Surgery   Left breast mammoplasty: IDC, grade 1, 1.2cm, DCIS, lobular neoplasia, T1c, Nx, ER 80%, PR60%, Ki67 3% HER-2 neg Right breast lumpectomy: IDC, grade 1, 2.3cm, DCIS. T2N1a ER/PR+HER-2 neg, Ki 70%.   11/28/2015 Miscellaneous   Mammaprint was Low Risk. Predicting a 98% chance of no disease recurrence within 5 years with anti-estrogens as the only systemic therapy.  It also predicts minimal to no benefit from  chemotherapy.     12/26/2015 Surgery   Bilateral mastectomies: bilateral sentinel node sampling, Right: no residual carcinoma, left, focal DCIS, 4 SLN negative   02/11/2016 - 03/27/2016 Radiation Therapy   Adjuvant radiation [name]): 1) Right chest wall: 50.4 Gy in 28 fractions  2) Right chest wall boost: 10 Gy in 5 fractions   08/04/2017 Relapse/Recurrence   08/04/2017, MRI of the liver and total spine MRI 08/11/2017 showed liver and bone metastasis; liver biopsy 09/07/2017 confirms estrogen receptor positive, progesterone receptor negative, HER-2 negative metastatic breast cancer; chest CT scan 08/13/2017 shows multiple bilateral pulmonary nodules.   08/17/2017 - 01/04/2018 Anti-estrogen oral therapy   fulvestrant  palbociclib started 08/31/2017 discontinued 01/04/2018 with evidence of progression in the liver   08/2017 Miscellaneous   [facility] report on liver biopsy from April 2019 shows a negative PDL 1, stable MSI and proficient mismatch repair status.  The tumor is positive for the androgen receptor, and for PTEN for PIK3 CA mutations.  HER-2/neu was read as equivocal (it was negative by FISH on the pathology report).   01/04/2018 Miscellaneous   anastrozole started 01/04/2018 everolimus started 01/29/2018   02/16/2018 - 03/01/2018 Radiation Therapy   01/28/18 prophylactic left femur intramedullary nail surgery for impending left femur pathologic fracture alliative radiation with Dr. [name] to the left femur and left ilium from 02/16/2018 to 03/01/2018   01/30/2020 - 05/2020 Anti-estrogen oral therapy   alpelisib started 01/30/2020 at 300 mg daily with Metformin 500 mg daily and Faslodex (stopped for progression)   05/22/2020 - 08/2020 Chemotherapy   Xeloda discontinued for progression   09/10/2020 - 06/18/2021 Chemotherapy   Patient is on Treatment Plan : BREAST Paclitaxel D1,D15  q28d     07/17/2021 -  Chemotherapy   Patient is on Treatment Plan : BREAST METASTATIC Sacituzumab govitecan-hziy  [name]) q21d     Bilateral malignant neoplasm of breast in female, estrogen receptor positive ([place])  12/26/2015 Initial Diagnosis   Bilateral malignant neoplasm of breast in female, estrogen receptor positive ([place])   07/17/2021 -  Chemotherapy   Patient is on Treatment Plan : BREAST METASTATIC Sacituzumab govitecan-hziy [name]) q21d     Malignant neoplasm metastatic to liver ([place])  08/13/2017 Initial Diagnosis   Liver metastases ([place])   07/17/2021 -  Chemotherapy   Patient is on Treatment Plan : BREAST METASTATIC Sacituzumab govitecan-hziy [name]) q21d     Malignant neoplasm metastatic to lung ([place])  09/10/2020 Initial Diagnosis   Lung metastases ([place])   07/17/2021 -  Chemotherapy   Patient is on Treatment Plan : BREAST METASTATIC Sacituzumab govitecan-hziy [name]) q21d       CURRENT THERAPY: [name]  INTERVAL HISTORY: LASHEIKA ORTLOFF 56 y.o. female returns for f/u of her metastatic breast cancer on treatment with [place].  She receives treatment on days 1 and 8 of a 21 day cycle.  She is also receiving Xgeva every 12 weeks.      Her most recent restaging occurred with CT CAP on  09/16/2021: Slight interval decrease in the size of the enlarged pretracheal, subcarinal, right hilar lymph nodes.  Numerous small bilateral lung nodules unchanged.  Numerous lesions in the liver poorly visualized.  Unchanged bone metastases.  Her next imaging is scheduled on 12/30/2021.    She had concerns for dental infection and was on antibiotics.  The swelling, pain and tenderness resolved with antibiotics however she is having some thick white vaginal discharge and itching.    She has some dryness around her lips on her face, but otherwise she is doing well.     Patient Active Problem List   Diagnosis Date Noted   Port-A-Cath in place 08/27/2021   Encounter for antineoplastic chemotherapy 10/08/2020   Lower extremity edema 10/08/2020   Malignant neoplasm metastatic to lung [facility])  09/10/2020   Goals of care, counseling/discussion 12/27/2018   Osteonecrosis ([place]) 11/01/2018   Pathological fracture in neoplastic disease, left femur, initial encounter for fracture ([place]) 01/28/2018   Morbid obesity with BMI of 40.0-44.9, adult ([place]) 22/29/7989   IUD complication ([place]) 21/19/4174   Bone metastases 08/13/2017   Pain from bone metastases ([place]) 08/13/2017   Malignant neoplasm metastatic to liver ([place]) 08/13/2017   Steatosis of liver 08/13/2017   Bilateral malignant neoplasm of breast in female, estrogen receptor positive ([place]) 12/26/2015   Malignant neoplasm of central portion of left breast in female, estrogen receptor positive ([place]) 11/28/2015   Malignant neoplasm of upper-inner quadrant of right breast in female, estrogen receptor positive ([place]) 10/17/2015   Hypertension 04/19/2012   Hypothyroid 04/19/2012   Fibroids 04/19/2012   Stress incontinence, female 04/19/2012    is allergic to ace inhibitors, atorvastatin, simvastatin, and percocet [oxycodone-acetaminophen].  MEDICAL HISTORY: Past Medical History:  Diagnosis Date   Anxiety    Arthritis    Bone metastasis    Breast cancer of upper-inner quadrant of right female breast [facility]) oncologist-  dr [name]--- 04/ 2019 ER/PR+ Stage IV w/ metastatic disease to liver, total spine, lung, bone   dx 06/ 2017  right breast invasive ductal carcinoma, Stage IIB, ER/PR + (pT2 pN1), grade 1-- s/p  right breast lumpecotmy w/ sln bx's and bilateral breast reduction 11-28-2015/  left breast with reduction ,  dx invasive ductal ca, Grade 1 (pT1cpNX) ER/PR positive/  adjuvant radiation completed 03-27-2016 to right chest wall, 2018  reclassifiec Stage IIA   Cancer, metastatic to liver [facility])    Chronic back pain    Constipation    Depression    Family history of adverse reaction to anesthesia     mother has Copd was kept on vent    Headache    Heart murmur    History of hyperthyroidism    s/p  RAI   History of radiation  therapy 02/11/16- 03/27/16   Right Chest Wall 50.4 Gy in 28 fractions, Right Chest Wall Boost 10 Gy in 5 fractions.    Hyperlipidemia    Hypertension    Hypothyroidism, postradioiodine therapy    endocrinoloigst-  dr balan--  2004  s/p  RAI   Menorrhagia 2011   Multiple pulmonary nodules    secondary  metastatic   PONV (postoperative nausea and vomiting)    patch helps    SUI (stress urinary incontinence, female)    Wears contact lenses    Wears dentures    full upper and lower partial    SURGICAL HISTORY: Past Surgical History:  Procedure Laterality Date   BREAST LUMPECTOMY WITH NEEDLE LOCALIZATION AND AXILLARY SENTINEL LYMPH NODE BX Right 11/28/2015   Procedure: RIGHT BREAST LUMPECTOMY WITH NEEDLE LOCALIZATION AND AXILLARY SENTINEL LYMPH NODE BX;  Surgeon: Autumn Messing III, MD;  Location: [place];  Service: General;  Laterality: Right;   BREAST REDUCTION SURGERY Bilateral 11/28/2015   Procedure: MAMMARY REDUCTION  (BREAST) BILATERAL;  Surgeon: Crissie Reese, MD;  Location: [place];  Service: Plastics;  Laterality: Bilateral;   CESAREAN SECTION  01/12/1993   COLONOSCOPY     Dental surgeries     FEMUR IM NAIL Left 01/28/2018   Procedure: INTRAMEDULLARY (IM) NAIL FEMORAL;  Surgeon: Nicholes Stairs, MD;  Location: [place];  Service: Orthopedics;  Laterality: Left;   HYSTEROSCOPY N/A 09/02/2017   Procedure: HYSTEROSCOPY  to remove IUD;  Surgeon: Eldred Manges, MD;  Location: [place];  Service: Gynecology;  Laterality: N/A;   IR IMAGING GUIDED PORT INSERTION  09/16/2020   Liver biospy     MASTECTOMY     MASTECTOMY W/ SENTINEL NODE BIOPSY Bilateral 12/26/2015   Procedure: BILATERAL MASTECTOMY WITH LEFT SENTINEL LYMPH NODE BIOPSY;  Surgeon: Autumn Messing III, MD;  Location: [place];  Service: General;  Laterality: Bilateral;   Removal of IUD     TUBAL LIGATION Bilateral 1995    SOCIAL HISTORY: Social History   Socioeconomic History   Marital status: Married    Spouse  name: Not on file   Number of children: Not on file   Years of education: Not on file   Highest education level: Not on file  Occupational History   Not on file  Tobacco Use   Smoking status: Never   Smokeless tobacco: Never  Vaping Use   Vaping Use: Never used  Substance and Sexual Activity   Alcohol use: No   Drug use: No   Sexual activity: Not on file    Comment: BTL   Other Topics Concern   Not on file  Social History Narrative   Not on file   Social Determinants of Health   Financial Resource Strain: Not on file  Food Insecurity: Not on file  Transportation Needs: No Transportation Needs (04/01/2018)   PRAPARE - Transportation    Lack of Transportation (Medical): No    Lack of  Transportation (Non-Medical): No  Physical Activity: Not on file  Stress: Not on file  Social Connections: Not on file  Intimate Partner Violence: Not At Risk (01/18/2018)   Humiliation, Afraid, Rape, and Kick questionnaire    Fear of Current or Ex-Partner: No    Emotionally Abused: No    Physically Abused: No    Sexually Abused: No    FAMILY HISTORY: Family History  Problem Relation Age of Onset   Diabetes Mother    Hypertension Mother    Hypertension Father    Diabetes Maternal Grandmother    Hypertension Sister    Cancer Paternal Grandmother        colon   Colon cancer Paternal Grandmother    Esophageal cancer Neg Hx    Stomach cancer Neg Hx    Rectal cancer Neg Hx     Review of Systems  Constitutional:  Positive for fatigue. Negative for appetite change, chills, fever and unexpected weight change.  HENT:   Negative for hearing loss, lump/mass and trouble swallowing.   Eyes:  Negative for eye problems and icterus.  Respiratory:  Negative for chest tightness, cough and shortness of breath.   Cardiovascular:  Negative for chest pain, leg swelling and palpitations.  Gastrointestinal:  Negative for abdominal distention, abdominal pain, constipation, diarrhea, nausea and vomiting.   Endocrine: Negative for hot flashes.  Genitourinary:  Negative for difficulty urinating.   Musculoskeletal:  Negative for arthralgias.  Skin:  Negative for itching and rash.  Neurological:  Negative for dizziness, extremity weakness, headaches and numbness.  Hematological:  Negative for adenopathy. Does not bruise/bleed easily.  Psychiatric/Behavioral:  Negative for depression. The patient is not nervous/anxious.       PHYSICAL EXAMINATION  ECOG PERFORMANCE STATUS: 2 - Symptomatic, <50% confined to bed  Vitals:   11/20/21 0815  BP: (!) 145/66  Pulse: 86  Resp: 18  Temp: 98.1 F (36.7 C)  SpO2: 97%    Physical Exam Constitutional:      General: She is not in acute distress.    Appearance: Normal appearance. She is not toxic-appearing.  HENT:     Head: Normocephalic and atraumatic.     Mouth/Throat:     Mouth: Mucous membranes are moist.     Comments: Has dentures in, unable to examine Eyes:     General: No scleral icterus. Cardiovascular:     Rate and Rhythm: Normal rate and regular rhythm.     Pulses: Normal pulses.     Heart sounds: Normal heart sounds.  Pulmonary:     Effort: Pulmonary effort is normal.     Breath sounds: Normal breath sounds.  Abdominal:     General: Abdomen is flat. Bowel sounds are normal. There is no distension.     Palpations: Abdomen is soft.     Tenderness: There is no abdominal tenderness.  Musculoskeletal:        General: No swelling.     Cervical back: Neck supple.  Lymphadenopathy:     Cervical: No cervical adenopathy.  Skin:    General: Skin is warm and dry.     Findings: No rash.  Neurological:     General: No focal deficit present.     Mental Status: She is alert.  Psychiatric:        Mood and Affect: Mood normal.        Behavior: Behavior normal.     LABORATORY DATA:  CBC    Component Value Date/Time   WBC 4.0 11/20/2021 0758  WBC 7.2 07/16/2021 1030   RBC 3.44 (L) 11/20/2021 0758   HGB 10.6 (L) 11/20/2021  0758   HGB 13.2 07/08/2016 0948   HCT 32.4 (L) 11/20/2021 0758   HCT 39.9 07/08/2016 0948   PLT 258 11/20/2021 0758   PLT 276 07/08/2016 0948   MCV 94.2 11/20/2021 0758   MCV 88.1 07/08/2016 0948   MCH 30.8 11/20/2021 0758   MCHC 32.7 11/20/2021 0758   RDW 18.5 (H) 11/20/2021 0758   RDW 14.4 07/08/2016 0948   LYMPHSABS 0.5 (L) 11/20/2021 0758   LYMPHSABS 1.1 07/08/2016 0948   MONOABS 0.4 11/20/2021 0758   MONOABS 0.4 07/08/2016 0948   EOSABS 0.1 11/20/2021 0758   EOSABS 0.1 07/08/2016 0948   BASOSABS 0.0 11/20/2021 0758   BASOSABS 0.0 07/08/2016 0948    CMP     Component Value Date/Time   NA 140 11/06/2021 0747   NA 140 07/08/2016 0948   K 2.9 (L) 11/06/2021 0747   K 4.1 07/08/2016 0948   CL 101 11/06/2021 0747   CO2 33 (H) 11/06/2021 0747   CO2 24 07/08/2016 0948   GLUCOSE 108 (H) 11/06/2021 0747   GLUCOSE 89 07/08/2016 0948   BUN 13 11/06/2021 0747   BUN 11.9 07/08/2016 0948   CREATININE 0.77 11/06/2021 0747   CREATININE 0.9 07/08/2016 0948   CALCIUM 8.9 11/06/2021 0747   CALCIUM 10.0 07/08/2016 0948   PROT 6.7 11/06/2021 0747   PROT 7.6 07/08/2016 0948   ALBUMIN 3.6 11/06/2021 0747   ALBUMIN 3.6 07/08/2016 0948   AST 29 11/06/2021 0747   AST 22 07/08/2016 0948   ALT 32 11/06/2021 0747   ALT 27 07/08/2016 0948   ALKPHOS 159 (H) 11/06/2021 0747   ALKPHOS 115 07/08/2016 0948   BILITOT 0.4 11/06/2021 0747   BILITOT 0.35 07/08/2016 0948   GFRNONAA >60 11/06/2021 0747   GFRAA >60 01/30/2020 0814   GFRAA >60 05/16/2019 0905       ASSESSMENT and THERAPY PLAN:   Malignant neoplasm of upper-inner quadrant of right breast in female, estrogen receptor positive ([place]) Metastatic Breast Cancer: ER Pos and Her 2 Neg (previously progressed on Ibrance, fulvestrant, everolimus, alpelisib, Xeloda) Prior Treatment: Taxol 09/10/2020-06/18/21 Chronic right arm lymphedema IHC: HER2  0 ----------------------------------------------------------------------------------------------------------------------- Current treatment: Palliative treatment with Sacituzumab-Govitecan, today cycle 6   Toxicities: 1.  Neutropenia: We reduced the dosage of Trodelvy with cycle 2 2. Fatigue-mild, patient continues to work.   3.  Chemotherapy-induced anemia is stable 4.  Nausea: resolved 5. Constipation alternating with diarrhea-unchanged 6.  Dryness of the face and acne 7.  Dry cough with walking: Resolved with pepcid   Stachia is doing well today and she has no clinical signs of progression of her metastatic breast cancer.  She is tolerating [name] well and will proceed with this so long as her c-Met is within parameters which is currently pending.  She does have symptoms of vaginal candida therefore I sent in Diflucan to her pharmacy.  We will see her back as noted below.   Return to clinic for Trodelvy day 1 day 8 every 3 weeks.       All questions were answered. The patient knows to call the clinic with any problems, questions or concerns. We can certainly see the patient much sooner if necessary.  Total encounter time:20 minutes*in face-to-face visit time, chart review, lab review, care coordination, order entry, and documentation of the encounter time.    Wilber Bihari, NP 11/20/21 8:53 AM Medical Oncology and  Hematology [facility] [place], [place] [address] Tel. [phone]    Fax. [phone]  *Total Encounter Time as defined by the Centers for Medicare and Medicaid Services includes, in addition to the face-to-face time of a patient visit (documented in the note above) non-face-to-face time: obtaining and reviewing outside history, ordering and reviewing medications, tests or procedures, care coordination (communications with other health care professionals or caregivers) and documentation in the medical record.

## 2021-11-21 ENCOUNTER — Other Ambulatory Visit (HOSPITAL_COMMUNITY): Payer: Self-pay

## 2021-11-21 LAB — CANCER ANTIGEN 27.29: CA 27.29: 242.3 U/mL — ABNORMAL HIGH (ref 0.0–38.6)

## 2021-11-25 ENCOUNTER — Other Ambulatory Visit: Payer: Self-pay | Admitting: *Deleted

## 2021-11-25 DIAGNOSIS — C7800 Secondary malignant neoplasm of unspecified lung: Secondary | ICD-10-CM

## 2021-11-26 MED FILL — Fosaprepitant Dimeglumine For IV Infusion 150 MG (Base Eq): INTRAVENOUS | Qty: 5 | Status: AC

## 2021-11-26 MED FILL — Dexamethasone Sodium Phosphate Inj 100 MG/10ML: INTRAMUSCULAR | Qty: 1 | Status: AC

## 2021-11-27 ENCOUNTER — Inpatient Hospital Stay: Payer: 59

## 2021-11-27 ENCOUNTER — Inpatient Hospital Stay: Payer: 59 | Admitting: Adult Health

## 2021-11-27 ENCOUNTER — Other Ambulatory Visit: Payer: Self-pay

## 2021-11-27 VITALS — BP 153/83 | HR 84 | Temp 98.5°F | Resp 17 | Wt 289.8 lb

## 2021-11-27 DIAGNOSIS — Z5111 Encounter for antineoplastic chemotherapy: Secondary | ICD-10-CM | POA: Diagnosis not present

## 2021-11-27 DIAGNOSIS — Z79899 Other long term (current) drug therapy: Secondary | ICD-10-CM | POA: Diagnosis not present

## 2021-11-27 DIAGNOSIS — C50211 Malignant neoplasm of upper-inner quadrant of right female breast: Secondary | ICD-10-CM

## 2021-11-27 DIAGNOSIS — C7800 Secondary malignant neoplasm of unspecified lung: Secondary | ICD-10-CM

## 2021-11-27 DIAGNOSIS — C787 Secondary malignant neoplasm of liver and intrahepatic bile duct: Secondary | ICD-10-CM | POA: Diagnosis not present

## 2021-11-27 DIAGNOSIS — Z95828 Presence of other vascular implants and grafts: Secondary | ICD-10-CM

## 2021-11-27 DIAGNOSIS — C50111 Malignant neoplasm of central portion of right female breast: Secondary | ICD-10-CM

## 2021-11-27 DIAGNOSIS — C7951 Secondary malignant neoplasm of bone: Secondary | ICD-10-CM | POA: Diagnosis not present

## 2021-11-27 DIAGNOSIS — Z170 Estrogen receptor positive status [ER+]: Secondary | ICD-10-CM | POA: Diagnosis not present

## 2021-11-27 DIAGNOSIS — Z9013 Acquired absence of bilateral breasts and nipples: Secondary | ICD-10-CM | POA: Diagnosis not present

## 2021-11-27 LAB — CMP (CANCER CENTER ONLY)
ALT: 49 U/L — ABNORMAL HIGH (ref 0–44)
AST: 41 U/L (ref 15–41)
Albumin: 3.6 g/dL (ref 3.5–5.0)
Alkaline Phosphatase: 174 U/L — ABNORMAL HIGH (ref 38–126)
Anion gap: 6 (ref 5–15)
BUN: 12 mg/dL (ref 6–20)
CO2: 29 mmol/L (ref 22–32)
Calcium: 8.9 mg/dL (ref 8.9–10.3)
Chloride: 104 mmol/L (ref 98–111)
Creatinine: 0.7 mg/dL (ref 0.44–1.00)
GFR, Estimated: >60 mL/min (ref >60)
Glucose, Bld: 114 mg/dL — ABNORMAL HIGH (ref 70–99)
Potassium: 3.8 mmol/L (ref 3.5–5.1)
Sodium: 139 mmol/L (ref 135–145)
Total Bilirubin: 0.3 mg/dL (ref 0.3–1.2)
Total Protein: 6.6 g/dL (ref 6.5–8.1)

## 2021-11-27 LAB — CBC WITH DIFFERENTIAL (CANCER CENTER ONLY)
Abs Immature Granulocytes: 0.07 10*3/uL (ref 0.00–0.07)
Basophils Absolute: 0 10*3/uL (ref 0.0–0.1)
Basophils Relative: 1 %
Eosinophils Absolute: 0.1 10*3/uL (ref 0.0–0.5)
Eosinophils Relative: 3 %
HCT: 34 % — ABNORMAL LOW (ref 36.0–46.0)
Hemoglobin: 11 g/dL — ABNORMAL LOW (ref 12.0–15.0)
Immature Granulocytes: 2 %
Lymphocytes Relative: 16 %
Lymphs Abs: 0.5 10*3/uL — ABNORMAL LOW (ref 0.7–4.0)
MCH: 30.7 pg (ref 26.0–34.0)
MCHC: 32.4 g/dL (ref 30.0–36.0)
MCV: 95 fL (ref 80.0–100.0)
Monocytes Absolute: 0.5 10*3/uL (ref 0.1–1.0)
Monocytes Relative: 14 %
Neutro Abs: 2.2 10*3/uL (ref 1.7–7.7)
Neutrophils Relative %: 64 %
Platelet Count: 232 10*3/uL (ref 150–400)
RBC: 3.58 MIL/uL — ABNORMAL LOW (ref 3.87–5.11)
RDW: 17.8 % — ABNORMAL HIGH (ref 11.5–15.5)
WBC Count: 3.4 10*3/uL — ABNORMAL LOW (ref 4.0–10.5)
nRBC: 0.6 % — ABNORMAL HIGH (ref 0.0–0.2)

## 2021-11-27 MED ORDER — SODIUM CHLORIDE 0.9 % IV SOLN
6.0000 mg/kg | Freq: Once | INTRAVENOUS | Status: AC
  Administered 2021-11-27: 800 mg via INTRAVENOUS
  Filled 2021-11-27: qty 80

## 2021-11-27 MED ORDER — SODIUM CHLORIDE 0.9 % IV SOLN: Freq: Once | INTRAVENOUS | Status: AC

## 2021-11-27 MED ORDER — ACETAMINOPHEN 325 MG PO TABS
650.0000 mg | ORAL_TABLET | Freq: Once | ORAL | Status: AC
  Administered 2021-11-27: 650 mg via ORAL
  Filled 2021-11-27: qty 2

## 2021-11-27 MED ORDER — ATROPINE SULFATE 1 MG/ML IV SOLN
0.5000 mg | Freq: Once | INTRAVENOUS | Status: AC | PRN
  Administered 2021-11-27: 0.5 mg via INTRAVENOUS
  Filled 2021-11-27: qty 1

## 2021-11-27 MED ORDER — DENOSUMAB 120 MG/1.7ML ~~LOC~~ SOLN
120.0000 mg | Freq: Once | SUBCUTANEOUS | Status: AC
  Administered 2021-11-27: 120 mg via SUBCUTANEOUS
  Filled 2021-11-27: qty 1.7

## 2021-11-27 MED ORDER — PALONOSETRON HCL INJECTION 0.25 MG/5ML
0.2500 mg | Freq: Once | INTRAVENOUS | Status: AC
  Administered 2021-11-27: 0.25 mg via INTRAVENOUS
  Filled 2021-11-27: qty 5

## 2021-11-27 MED ORDER — SODIUM CHLORIDE 0.9 % IV SOLN
10.0000 mg | Freq: Once | INTRAVENOUS | Status: AC
  Administered 2021-11-27: 10 mg via INTRAVENOUS
  Filled 2021-11-27: qty 10

## 2021-11-27 MED ORDER — SODIUM CHLORIDE 0.9% FLUSH
10.0000 mL | INTRAVENOUS | Status: DC | PRN
  Administered 2021-11-27: 10 mL

## 2021-11-27 MED ORDER — SODIUM CHLORIDE 0.9 % IV SOLN
150.0000 mg | Freq: Once | INTRAVENOUS | Status: AC
  Administered 2021-11-27: 150 mg via INTRAVENOUS
  Filled 2021-11-27: qty 150

## 2021-11-27 MED ORDER — FAMOTIDINE IN NACL 20-0.9 MG/50ML-% IV SOLN
20.0000 mg | Freq: Once | INTRAVENOUS | Status: AC
  Administered 2021-11-27: 20 mg via INTRAVENOUS
  Filled 2021-11-27: qty 50

## 2021-11-27 MED ORDER — HEPARIN SOD (PORK) LOCK FLUSH 100 UNIT/ML IV SOLN
500.0000 [IU] | Freq: Once | INTRAVENOUS | Status: AC | PRN
  Administered 2021-11-27: 500 [IU]

## 2021-11-27 MED ORDER — DIPHENHYDRAMINE HCL 50 MG/ML IJ SOLN
50.0000 mg | Freq: Once | INTRAMUSCULAR | Status: AC
  Administered 2021-11-27: 50 mg via INTRAVENOUS
  Filled 2021-11-27: qty 1

## 2021-11-27 MED ORDER — SODIUM CHLORIDE 0.9% FLUSH
10.0000 mL | Freq: Once | INTRAVENOUS | Status: AC
  Administered 2021-11-27: 10 mL

## 2021-11-28 LAB — CANCER ANTIGEN 27.29: CA 27.29: 251.7 U/mL — ABNORMAL HIGH (ref 0.0–38.6)

## 2021-12-01 ENCOUNTER — Other Ambulatory Visit: Payer: Self-pay

## 2021-12-02 ENCOUNTER — Other Ambulatory Visit: Payer: Self-pay | Admitting: Hematology and Oncology

## 2021-12-02 ENCOUNTER — Other Ambulatory Visit: Payer: Self-pay | Admitting: Adult Health

## 2021-12-02 ENCOUNTER — Other Ambulatory Visit (HOSPITAL_COMMUNITY): Payer: Self-pay

## 2021-12-02 ENCOUNTER — Other Ambulatory Visit: Payer: Self-pay

## 2021-12-02 MED ORDER — POTASSIUM CHLORIDE 20 MEQ/15ML (10%) PO SOLN
40.0000 meq | Freq: Every day | ORAL | 0 refills | Status: DC
  Filled 2021-12-02: qty 473, 15d supply, fill #0

## 2021-12-03 ENCOUNTER — Other Ambulatory Visit (HOSPITAL_COMMUNITY): Payer: Self-pay

## 2021-12-03 MED ORDER — OMEPRAZOLE 40 MG PO CPDR
40.0000 mg | DELAYED_RELEASE_CAPSULE | Freq: Every day | ORAL | 3 refills | Status: DC
  Filled 2021-12-03: qty 30, 30d supply, fill #0
  Filled 2022-01-01: qty 30, 30d supply, fill #1
  Filled 2022-01-29: qty 30, 30d supply, fill #2
  Filled 2022-03-10: qty 30, 30d supply, fill #3

## 2021-12-04 ENCOUNTER — Other Ambulatory Visit (HOSPITAL_COMMUNITY): Payer: Self-pay

## 2021-12-08 NOTE — Progress Notes (Signed)
Patient Care Team: Kelton Pillar, MD as PCP - General (Family Medicine) Jacelyn Pi, MD as Consulting Physician (Endocrinology) Jovita Kussmaul, MD as Consulting Physician (General Surgery) [name], MD as Attending Physician (Radiation Oncology) Belva Crome, MD as Consulting Physician (Cardiology) [name] [name], NP as Nurse Practitioner (Hematology and Oncology) Lajoyce Lauber, DMD (Dentistry) Raina Mina, RPH-CPP (Pharmacist) Nicholas Lose, MD as Consulting Physician (Hematology and Oncology)  DIAGNOSIS:  Encounter Diagnosis  Name Primary?   Malignant neoplasm of upper-inner quadrant of right breast in female, estrogen receptor positive ([place])     SUMMARY OF ONCOLOGIC HISTORY: Oncology History  Malignant neoplasm of upper-inner quadrant of right breast in female, estrogen receptor positive ([place])  10/15/2015 Initial Biopsy   Right breast upper inner quadrant biopsy: IDC, DCIS, grade 2, ER+(90%), PR+(50%), Ki67 70%, HER-2 negative (ratio 1.18).   10/17/2015 Initial Diagnosis   Malignant neoplasm of upper-inner quadrant of right breast in female, estrogen receptor positive ([place])   10/2015 -  Anti-estrogen oral therapy   Tamoxifen daily   11/28/2015 Surgery   Left breast mammoplasty: IDC, grade 1, 1.2cm, DCIS, lobular neoplasia, T1c, Nx, ER 80%, PR60%, Ki67 3% HER-2 neg Right breast lumpectomy: IDC, grade 1, 2.3cm, DCIS. T2N1a ER/PR+HER-2 neg, Ki 70%.   11/28/2015 Miscellaneous   Mammaprint was Low Risk. Predicting a 98% chance of no disease recurrence within 5 years with anti-estrogens as the only systemic therapy.  It also predicts minimal to no benefit from chemotherapy.     12/26/2015 Surgery   Bilateral mastectomies: bilateral sentinel node sampling, Right: no residual carcinoma, left, focal DCIS, 4 SLN negative   02/11/2016 - 03/27/2016 Radiation Therapy   Adjuvant radiation [name]): 1) Right chest wall: 50.4 Gy in 28 fractions  2) Right chest  wall boost: 10 Gy in 5 fractions   08/04/2017 Relapse/Recurrence   08/04/2017, MRI of the liver and total spine MRI 08/11/2017 showed liver and bone metastasis; liver biopsy 09/07/2017 confirms estrogen receptor positive, progesterone receptor negative, HER-2 negative metastatic breast cancer; chest CT scan 08/13/2017 shows multiple bilateral pulmonary nodules.   08/17/2017 - 01/04/2018 Anti-estrogen oral therapy   fulvestrant  palbociclib started 08/31/2017 discontinued 01/04/2018 with evidence of progression in the liver   08/2017 Miscellaneous   [facility] report on liver biopsy from April 2019 shows a negative PDL 1, stable MSI and proficient mismatch repair status.  The tumor is positive for the androgen receptor, and for PTEN for PIK3 CA mutations.  HER-2/neu was read as equivocal (it was negative by FISH on the pathology report).   01/04/2018 Miscellaneous   anastrozole started 01/04/2018 everolimus started 01/29/2018   02/16/2018 - 03/01/2018 Radiation Therapy   01/28/18 prophylactic left femur intramedullary nail surgery for impending left femur pathologic fracture alliative radiation with Dr. [name] to the left femur and left ilium from 02/16/2018 to 03/01/2018   01/30/2020 - 05/2020 Anti-estrogen oral therapy   alpelisib started 01/30/2020 at 300 mg daily with Metformin 500 mg daily and Faslodex (stopped for progression)   05/22/2020 - 08/2020 Chemotherapy   Xeloda discontinued for progression   09/10/2020 - 06/18/2021 Chemotherapy   Patient is on Treatment Plan : BREAST Paclitaxel D1,D15 q28d     07/17/2021 -  Chemotherapy   Patient is on Treatment Plan : BREAST METASTATIC Sacituzumab govitecan-hziy [name]) q21d     Bilateral malignant neoplasm of breast in female, estrogen receptor positive ([place])  12/26/2015 Initial Diagnosis   Bilateral malignant neoplasm of breast in female, estrogen receptor positive ([place])  07/17/2021 -  Chemotherapy   Patient is on Treatment Plan : BREAST METASTATIC  Sacituzumab govitecan-hziy [name]) q21d     Malignant neoplasm metastatic to liver ([place])  08/13/2017 Initial Diagnosis   Liver metastases ([place])   07/17/2021 -  Chemotherapy   Patient is on Treatment Plan : BREAST METASTATIC Sacituzumab govitecan-hziy [name]) q21d     Malignant neoplasm metastatic to lung ([place])  09/10/2020 Initial Diagnosis   Lung metastases ([place])   07/17/2021 -  Chemotherapy   Patient is on Treatment Plan : BREAST METASTATIC Sacituzumab govitecan-hziy [name]) q21d       CHIEF COMPLIANT: Follow up [name] cycle 7  INTERVAL HISTORY: Debbie [name] is a 56 year old with above-mentioned history of metastatic breast cancer. She presents to the clinic for a follow-up.  She reports issues with mild nausea.  She did not have to take the nausea medication.  Some fatigue.  Significant dryness on her face which led to some itching and discomfort for which she is applying moisturizers.  Denies any peripheral neuropathy.  She does complain that she has worsening lower back pain.  She does not want to take any pain medication.  She does have constipation as well for which she is using some natural medications as well as increasing her fluid intake.   ALLERGIES:  is allergic to ace inhibitors, atorvastatin, simvastatin, and percocet [oxycodone-acetaminophen].  MEDICATIONS:  Current Outpatient Medications  Medication Sig Dispense Refill   acetaminophen (TYLENOL) 500 MG tablet Take 1,000 mg by mouth every 6 (six) hours as needed.     amoxicillin-clavulanate (AUGMENTIN) 875-125 MG tablet Take 1 tablet by mouth 2 times daily. 14 tablet 0   Calcium-Magnesium 250-125 MG TABS Take 1 tablet by mouth daily. 120 each 4   cetirizine (ZYRTEC) 10 MG tablet Take 10 mg by mouth daily as needed for allergies.      chlorhexidine (PERIDEX) 0.12 % solution Use 5 mLs in the mouth or throat 2 (two) times daily as needed as directed. *Do not swallow 473 mL 6   diphenoxylate-atropine (LOMOTIL)  2.5-0.025 MG tablet Take 1 tablet by mouth 4 (four) times daily as needed for diarrhea or loose stools. 30 tablet 2   doxycycline (VIBRA-TABS) 100 MG tablet Take 1 tablet (100 mg total) by mouth daily. 30 tablet 2   fluconazole (DIFLUCAN) 100 MG tablet Take 1 tablet (100 mg total) by mouth daily. 7 tablet 0   furosemide (LASIX) 20 MG tablet Take 1 tablet (20 mg total) by mouth daily. 90 tablet 6   levothyroxine (SYNTHROID) 175 MCG tablet Take 1 tablet (175 mcg total) by mouth in the morning on an empty stomach (Patient taking differently: Pt taking 6 days in a row, skip 7th day.) 30 tablet 6   losartan-hydrochlorothiazide (HYZAAR) 100-25 MG tablet Take 1 tablet by mouth once a day 90 tablet 0   metFORMIN (GLUCOPHAGE-XR) 500 MG 24 hr tablet Take 1 tablet by mouth  with evening meal 90 tablet 1   Multiple Vitamin (MULTIVITAMIN) tablet Take 1 tablet by mouth daily.     omega-3 acid ethyl esters (LOVAZA) 1 g capsule Take 1 g by mouth 2 (two) times daily.      omeprazole (PRILOSEC) 40 MG capsule Take 1 capsule (40 mg total) by mouth daily. 30 capsule 3   ondansetron (ZOFRAN-ODT) 8 MG disintegrating tablet Dissolve 1 tablet (8 mg total) by mouth every 8 (eight) hours as needed for nausea or vomiting. 20 tablet 0   oxyCODONE (OXY IR/ROXICODONE) 5 MG  immediate release tablet Take 1 tablet (5 mg total) by mouth every 4 (four) hours as needed for severe pain. 60 tablet 0   potassium chloride 20 MEQ/15ML (10%) SOLN Take 30 mLs by mouth daily. 473 mL 0   pregabalin (LYRICA) 100 MG capsule Take 1 capsule by mouth 3 times daily. 90 capsule 3   rosuvastatin (CRESTOR) 5 MG tablet Take 1 tablet (5 mg total) by mouth daily. 90 tablet 1   sertraline (ZOLOFT) 100 MG tablet TAKE 1 TABLET BY MOUTH ONCE DAILY 30 tablet 12   valACYclovir (VALTREX) 1000 MG tablet Take 1 tablet (1,000 mg total) by mouth daily. 60 tablet 6   Vitamin D, Cholecalciferol, 1000 units CAPS Take 1 tablet by mouth daily. (Patient taking differently:  Take 1,000 Units by mouth daily.) 90 capsule 4   No current facility-administered medications for this visit.    PHYSICAL EXAMINATION: ECOG PERFORMANCE STATUS: 1 - Symptomatic but completely ambulatory  Vitals:   12/18/21 0834  BP: (!) 160/85  Pulse: 94  Resp: 18  Temp: (!) 97.5 F (36.4 C)  SpO2: 100%   Filed Weights   12/18/21 0834  Weight: 294 lb 1.6 oz (133.4 kg)      LABORATORY DATA:  I have reviewed the data as listed    Latest Ref Rng & Units 12/11/2021    7:49 AM 11/27/2021    8:02 AM 11/20/2021    7:58 AM  CMP  Glucose 70 - 99 mg/dL 112  114  124   BUN 6 - 20 mg/dL $Remove'11  12  10   'pbBMJaQ$ Creatinine 0.44 - 1.00 mg/dL 0.73  0.70  0.88   Sodium 135 - 145 mmol/L 141  139  141   Potassium 3.5 - 5.1 mmol/L 3.4  3.8  3.4   Chloride 98 - 111 mmol/L 103  104  105   CO2 22 - 32 mmol/L 32  29  30   Calcium 8.9 - 10.3 mg/dL 9.1  8.9  9.1   Total Protein 6.5 - 8.1 g/dL 6.7  6.6  6.6   Total Bilirubin 0.3 - 1.2 mg/dL 0.3  0.3  0.3   Alkaline Phos 38 - 126 U/L 185  174  194   AST 15 - 41 U/L 38  41  39   ALT 0 - 44 U/L 48  49  48     Lab Results  Component Value Date   WBC 3.6 (L) 12/18/2021   HGB 11.0 (L) 12/18/2021   HCT 33.2 (L) 12/18/2021   MCV 92.7 12/18/2021   PLT 244 12/18/2021   NEUTROABS 2.3 12/18/2021    ASSESSMENT & PLAN:  Malignant neoplasm of upper-inner quadrant of right breast in female, estrogen receptor positive ([place]) Metastatic Breast Cancer: ER Pos and Her 2 Neg (previously progressed on Ibrance, fulvestrant, everolimus, alpelisib, Xeloda) Prior Treatment: Taxol 09/10/2020-06/18/21 Chronic right arm lymphedema IHC: HER2 0 ----------------------------------------------------------------------------------------------------------------------- Current treatment: Palliative treatment with Sacituzumab-Govitecan, today cycle 7   Toxicities: 1.  Neutropenia: We reduced the dosage of Trodelvy with cycle 2 2. Fatigue 3.  Chemotherapy-induced anemia today's  hemoglobin is improving and today it is 11 4.  Nausea: Nausea is now mild 5. Constipation alternating with diarrhea 6.  Dryness of the face and acne       CT CAP 09/16/2021: Slight interval decrease in the size of the enlarged pretracheal, subcarinal, right hilar lymph nodes.  Numerous small bilateral lung nodules unchanged.  Numerous lesions in the liver poorly visualized.  Unchanged  bone metastases. Tumor markers are also coming down Next scans will be in August 22nd     Return to clinic for [facility] after scans in 3 weeks    No orders of the defined types were placed in this encounter.  The patient has a good understanding of the overall plan. she agrees with it. she will call with any problems that may develop before the next visit here. Total time spent: 30 mins including face to face time and time spent for planning, charting and co-ordination of care   Harriette Ohara, MD 12/18/21    I Gardiner Coins am scribing for Dr. Lindi Adie  I have reviewed the above documentation for accuracy and completeness, and I agree with the above.

## 2021-12-10 ENCOUNTER — Other Ambulatory Visit (HOSPITAL_COMMUNITY): Payer: Self-pay

## 2021-12-10 MED FILL — Fosaprepitant Dimeglumine For IV Infusion 150 MG (Base Eq): INTRAVENOUS | Qty: 5 | Status: AC

## 2021-12-10 MED FILL — Dexamethasone Sodium Phosphate Inj 100 MG/10ML: INTRAMUSCULAR | Qty: 1 | Status: AC

## 2021-12-11 ENCOUNTER — Other Ambulatory Visit: Payer: Self-pay

## 2021-12-11 ENCOUNTER — Inpatient Hospital Stay: Payer: 59

## 2021-12-11 ENCOUNTER — Inpatient Hospital Stay (HOSPITAL_BASED_OUTPATIENT_CLINIC_OR_DEPARTMENT_OTHER): Payer: 59 | Admitting: Adult Health

## 2021-12-11 ENCOUNTER — Other Ambulatory Visit (HOSPITAL_COMMUNITY): Payer: Self-pay

## 2021-12-11 ENCOUNTER — Encounter: Payer: Self-pay | Admitting: Adult Health

## 2021-12-11 ENCOUNTER — Inpatient Hospital Stay: Payer: 59 | Attending: Oncology

## 2021-12-11 VITALS — BP 137/78 | HR 90 | Resp 18

## 2021-12-11 VITALS — BP 155/82 | HR 106 | Temp 97.3°F | Resp 18 | Ht 66.0 in | Wt 295.1 lb

## 2021-12-11 DIAGNOSIS — Z170 Estrogen receptor positive status [ER+]: Secondary | ICD-10-CM | POA: Insufficient documentation

## 2021-12-11 DIAGNOSIS — Z5111 Encounter for antineoplastic chemotherapy: Secondary | ICD-10-CM | POA: Diagnosis not present

## 2021-12-11 DIAGNOSIS — C787 Secondary malignant neoplasm of liver and intrahepatic bile duct: Secondary | ICD-10-CM

## 2021-12-11 DIAGNOSIS — Z95828 Presence of other vascular implants and grafts: Secondary | ICD-10-CM

## 2021-12-11 DIAGNOSIS — Z79899 Other long term (current) drug therapy: Secondary | ICD-10-CM | POA: Diagnosis not present

## 2021-12-11 DIAGNOSIS — C50111 Malignant neoplasm of central portion of right female breast: Secondary | ICD-10-CM

## 2021-12-11 DIAGNOSIS — C7800 Secondary malignant neoplasm of unspecified lung: Secondary | ICD-10-CM | POA: Insufficient documentation

## 2021-12-11 DIAGNOSIS — C50211 Malignant neoplasm of upper-inner quadrant of right female breast: Secondary | ICD-10-CM

## 2021-12-11 DIAGNOSIS — C7951 Secondary malignant neoplasm of bone: Secondary | ICD-10-CM | POA: Insufficient documentation

## 2021-12-11 DIAGNOSIS — Z9013 Acquired absence of bilateral breasts and nipples: Secondary | ICD-10-CM | POA: Diagnosis not present

## 2021-12-11 LAB — CMP (CANCER CENTER ONLY)
ALT: 48 U/L — ABNORMAL HIGH (ref 0–44)
AST: 38 U/L (ref 15–41)
Albumin: 3.7 g/dL (ref 3.5–5.0)
Alkaline Phosphatase: 185 U/L — ABNORMAL HIGH (ref 38–126)
Anion gap: 6 (ref 5–15)
BUN: 11 mg/dL (ref 6–20)
CO2: 32 mmol/L (ref 22–32)
Calcium: 9.1 mg/dL (ref 8.9–10.3)
Chloride: 103 mmol/L (ref 98–111)
Creatinine: 0.73 mg/dL (ref 0.44–1.00)
GFR, Estimated: >60 mL/min (ref >60)
Glucose, Bld: 112 mg/dL — ABNORMAL HIGH (ref 70–99)
Potassium: 3.4 mmol/L — ABNORMAL LOW (ref 3.5–5.1)
Sodium: 141 mmol/L (ref 135–145)
Total Bilirubin: 0.3 mg/dL (ref 0.3–1.2)
Total Protein: 6.7 g/dL (ref 6.5–8.1)

## 2021-12-11 LAB — CBC WITH DIFFERENTIAL (CANCER CENTER ONLY)
Abs Immature Granulocytes: 0.03 10*3/uL (ref 0.00–0.07)
Basophils Absolute: 0 10*3/uL (ref 0.0–0.1)
Basophils Relative: 1 %
Eosinophils Absolute: 0.1 10*3/uL (ref 0.0–0.5)
Eosinophils Relative: 2 %
HCT: 32.5 % — ABNORMAL LOW (ref 36.0–46.0)
Hemoglobin: 10.7 g/dL — ABNORMAL LOW (ref 12.0–15.0)
Immature Granulocytes: 1 %
Lymphocytes Relative: 15 %
Lymphs Abs: 0.6 10*3/uL — ABNORMAL LOW (ref 0.7–4.0)
MCH: 30.7 pg (ref 26.0–34.0)
MCHC: 32.9 g/dL (ref 30.0–36.0)
MCV: 93.4 fL (ref 80.0–100.0)
Monocytes Absolute: 0.4 10*3/uL (ref 0.1–1.0)
Monocytes Relative: 10 %
Neutro Abs: 3 10*3/uL (ref 1.7–7.7)
Neutrophils Relative %: 71 %
Platelet Count: 224 10*3/uL (ref 150–400)
RBC: 3.48 MIL/uL — ABNORMAL LOW (ref 3.87–5.11)
RDW: 18 % — ABNORMAL HIGH (ref 11.5–15.5)
WBC Count: 4.2 10*3/uL (ref 4.0–10.5)
nRBC: 0 % (ref 0.0–0.2)

## 2021-12-11 MED ORDER — FUROSEMIDE 20 MG PO TABS
20.0000 mg | ORAL_TABLET | Freq: Every day | ORAL | 6 refills | Status: DC
  Filled 2021-12-11: qty 90, 90d supply, fill #0
  Filled 2022-03-10: qty 90, 90d supply, fill #1
  Filled 2022-06-04: qty 90, 90d supply, fill #2

## 2021-12-11 MED ORDER — SODIUM CHLORIDE 0.9% FLUSH
10.0000 mL | INTRAVENOUS | Status: DC | PRN
  Administered 2021-12-11: 10 mL

## 2021-12-11 MED ORDER — SODIUM CHLORIDE 0.9 % IV SOLN: Freq: Once | INTRAVENOUS | Status: AC

## 2021-12-11 MED ORDER — SODIUM CHLORIDE 0.9% FLUSH
10.0000 mL | Freq: Once | INTRAVENOUS | Status: AC
  Administered 2021-12-11: 10 mL

## 2021-12-11 MED ORDER — SODIUM CHLORIDE 0.9 % IV SOLN
6.0000 mg/kg | Freq: Once | INTRAVENOUS | Status: AC
  Administered 2021-12-11: 800 mg via INTRAVENOUS
  Filled 2021-12-11: qty 80

## 2021-12-11 MED ORDER — ACETAMINOPHEN 325 MG PO TABS
650.0000 mg | ORAL_TABLET | Freq: Once | ORAL | Status: AC
  Administered 2021-12-11: 650 mg via ORAL
  Filled 2021-12-11: qty 2

## 2021-12-11 MED ORDER — HEPARIN SOD (PORK) LOCK FLUSH 100 UNIT/ML IV SOLN
500.0000 [IU] | Freq: Once | INTRAVENOUS | Status: AC | PRN
  Administered 2021-12-11: 500 [IU]

## 2021-12-11 MED ORDER — PALONOSETRON HCL INJECTION 0.25 MG/5ML
0.2500 mg | Freq: Once | INTRAVENOUS | Status: AC
  Administered 2021-12-11: 0.25 mg via INTRAVENOUS
  Filled 2021-12-11: qty 5

## 2021-12-11 MED ORDER — SODIUM CHLORIDE 0.9 % IV SOLN
150.0000 mg | Freq: Once | INTRAVENOUS | Status: AC
  Administered 2021-12-11: 150 mg via INTRAVENOUS
  Filled 2021-12-11: qty 150

## 2021-12-11 MED ORDER — SODIUM CHLORIDE 0.9 % IV SOLN
10.0000 mg | Freq: Once | INTRAVENOUS | Status: AC
  Administered 2021-12-11: 10 mg via INTRAVENOUS
  Filled 2021-12-11: qty 10

## 2021-12-11 MED ORDER — DIPHENHYDRAMINE HCL 50 MG/ML IJ SOLN
50.0000 mg | Freq: Once | INTRAMUSCULAR | Status: AC
  Administered 2021-12-11: 50 mg via INTRAVENOUS
  Filled 2021-12-11: qty 1

## 2021-12-11 MED ORDER — ATROPINE SULFATE 1 MG/ML IV SOLN
0.5000 mg | Freq: Once | INTRAVENOUS | Status: AC | PRN
  Administered 2021-12-11: 0.5 mg via INTRAVENOUS
  Filled 2021-12-11: qty 1

## 2021-12-11 MED ORDER — FAMOTIDINE IN NACL 20-0.9 MG/50ML-% IV SOLN
20.0000 mg | Freq: Once | INTRAVENOUS | Status: AC
  Administered 2021-12-11: 20 mg via INTRAVENOUS
  Filled 2021-12-11: qty 50

## 2021-12-11 NOTE — Patient Instructions (Signed)
[facility] MEDICAL [facility]COLOGY  Discharge Instructions: Thank you for choosing [facility] to provide your oncology and hematology care.   If you have a lab appointment with the Cancer Center, please go directly to the Cancer Center and check in at the registration area.   Wear comfortable clothing and clothing appropriate for easy access to any Portacath or PICC line.   We strive to give you quality time with your provider. You may need to reschedule your appointment if you arrive late (15 or more minutes).  Arriving late affects you and other patients whose appointments are after yours.  Also, if you miss three or more appointments without notifying the office, you may be dismissed from the clinic at the provider's discretion.      For prescription refill requests, have your pharmacy contact our office and allow 72 hours for refills to be completed.    Today you received the following chemotherapy and/or immunotherapy agents: Trodelvy      To help prevent nausea and vomiting after your treatment, we encourage you to take your nausea medication as directed.  BELOW ARE SYMPTOMS THAT SHOULD BE REPORTED IMMEDIATELY: *FEVER GREATER THAN 100.4 F (38 C) OR HIGHER *CHILLS OR SWEATING *NAUSEA AND VOMITING THAT IS NOT CONTROLLED WITH YOUR NAUSEA MEDICATION *UNUSUAL SHORTNESS OF BREATH *UNUSUAL BRUISING OR BLEEDING *URINARY PROBLEMS (pain or burning when urinating, or frequent urination) *BOWEL PROBLEMS (unusual diarrhea, constipation, pain near the anus) TENDERNESS IN MOUTH AND THROAT WITH OR WITHOUT PRESENCE OF ULCERS (sore throat, sores in mouth, or a toothache) UNUSUAL RASH, SWELLING OR PAIN  UNUSUAL VAGINAL DISCHARGE OR ITCHING   Items with * indicate a potential emergency and should be followed up as soon as possible or go to the Emergency Department if any problems should occur.  Please show the CHEMOTHERAPY ALERT CARD or IMMUNOTHERAPY ALERT CARD at check-in to  the Emergency Department and triage nurse.  Should you have questions after your visit or need to cancel or reschedule your appointment, please contact [facility]  Dept: [phone]  and follow the prompts.  Office hours are 8:00 a.m. to 4:30 p.m. Monday - Friday. Please note that voicemails left after 4:00 p.m. may not be returned until the following business day.  We are closed weekends and major holidays. You have access to a nurse at all times for urgent questions. Please call the main number to the clinic Dept: [phone] and follow the prompts.   For any non-urgent questions, you may also contact your provider using MyChart. We now offer e-Visits for anyone 18 and older to request care online for non-urgent symptoms. For details visit [email or web address].   Also download the MyChart app! Go to the app store, search "MyChart", open the app, select [facility], and log in with your MyChart username and password.  Masks are optional in the cancer centers. If you would like for your care team to wear a mask while they are taking care of you, please let them know. You may have one support person who is at least 58 years old accompany you for your appointments. 

## 2021-12-11 NOTE — Progress Notes (Signed)
[facility] Follow up:    Debbie Pillar, MD [address]  [place] [address]   DIAGNOSIS:  Cancer Staging  Malignant neoplasm of central portion of left breast in female, estrogen receptor positive ([place]) Staging form: Breast, AJCC 7th Edition - Pathologic: Stage IA (T1c, N0, cM0) - Unsigned - Pathologic: Stage IV (M1) - Unsigned  Malignant neoplasm of upper-inner quadrant of right breast in female, estrogen receptor positive ([place]) Staging form: Breast, AJCC 7th Edition - Clinical stage from 10/23/2015: Stage IIA (T2, N0, M0) - Unsigned Staged by: Pathologist and managing physician Laterality: Right Estrogen receptor status: Positive Progesterone receptor status: Positive HER2 status: Negative Stage used in treatment planning: Yes National guidelines used in treatment planning: Yes Type of national guideline used in treatment planning: NCCN - Pathologic: Stage IIB (T2, N1, cM0) - Unsigned - Pathologic: Stage IV (M1) - Unsigned   SUMMARY OF ONCOLOGIC HISTORY: Oncology History  Malignant neoplasm of upper-inner quadrant of right breast in female, estrogen receptor positive ([place])  10/15/2015 Initial Biopsy   Right breast upper inner quadrant biopsy: IDC, DCIS, grade 2, ER+(90%), PR+(50%), Ki67 70%, HER-2 negative (ratio 1.18).   10/17/2015 Initial Diagnosis   Malignant neoplasm of upper-inner quadrant of right breast in female, estrogen receptor positive ([place])   10/2015 -  Anti-estrogen oral therapy   Tamoxifen daily   11/28/2015 Surgery   Left breast mammoplasty: IDC, grade 1, 1.2cm, DCIS, lobular neoplasia, T1c, Nx, ER 80%, PR60%, Ki67 3% HER-2 neg Right breast lumpectomy: IDC, grade 1, 2.3cm, DCIS. T2N1a ER/PR+HER-2 neg, Ki 70%.   11/28/2015 Miscellaneous   Mammaprint was Low Risk. Predicting a 98% chance of no disease recurrence within 5 years with anti-estrogens as the only systemic therapy.  It also predicts minimal to no benefit from  chemotherapy.     12/26/2015 Surgery   Bilateral mastectomies: bilateral sentinel node sampling, Right: no residual carcinoma, left, focal DCIS, 4 SLN negative   02/11/2016 - 03/27/2016 Radiation Therapy   Adjuvant radiation [name]): 1) Right chest wall: 50.4 Gy in 28 fractions  2) Right chest wall boost: 10 Gy in 5 fractions   08/04/2017 Relapse/Recurrence   08/04/2017, MRI of the liver and total spine MRI 08/11/2017 showed liver and bone metastasis; liver biopsy 09/07/2017 confirms estrogen receptor positive, progesterone receptor negative, HER-2 negative metastatic breast cancer; chest CT scan 08/13/2017 shows multiple bilateral pulmonary nodules.   08/17/2017 - 01/04/2018 Anti-estrogen oral therapy   fulvestrant  palbociclib started 08/31/2017 discontinued 01/04/2018 with evidence of progression in the liver   08/2017 Miscellaneous   [facility] report on liver biopsy from April 2019 shows a negative PDL 1, stable MSI and proficient mismatch repair status.  The tumor is positive for the androgen receptor, and for PTEN for PIK3 CA mutations.  HER-2/neu was read as equivocal (it was negative by FISH on the pathology report).   01/04/2018 Miscellaneous   anastrozole started 01/04/2018 everolimus started 01/29/2018   02/16/2018 - 03/01/2018 Radiation Therapy   01/28/18 prophylactic left femur intramedullary nail surgery for impending left femur pathologic fracture alliative radiation with Dr. [name] to the left femur and left ilium from 02/16/2018 to 03/01/2018   01/30/2020 - 05/2020 Anti-estrogen oral therapy   alpelisib started 01/30/2020 at 300 mg daily with Metformin 500 mg daily and Faslodex (stopped for progression)   05/22/2020 - 08/2020 Chemotherapy   Xeloda discontinued for progression   09/10/2020 - 06/18/2021 Chemotherapy   Patient is on Treatment Plan : BREAST Paclitaxel D1,D15  q28d     07/17/2021 -  Chemotherapy   Patient is on Treatment Plan : BREAST METASTATIC Sacituzumab govitecan-hziy  Drinda Butts) q21d     Bilateral malignant neoplasm of breast in female, estrogen receptor positive (H[facility])  12/26/2015 Initial Diagnosis   Bilateral malignant neoplasm of breast in female, estrogen receptor positive ([facility])   07/17/2021 -  Chemotherapy   Patient is on Treatment Plan : BREAST METASTATIC Sacituzumab govitecan-hziy Drinda Butts) q21d     Malignant neoplasm metastatic to liver ([facility]CC)  08/13/2017 Initial Diagnosis   Liver metastases (HCC)   07/17/2021 -  Chemotherapy   Patient is on Treatment Plan : BREAST METASTATIC Sacituzumab govitecan-hziy Drinda Butts) q21d     Malignant neoplasm metastatic to lung ([facility]CC)  09/10/2020 Initial Diagnosis   Lung metastases (HCC)   07/17/2021 -  Chemotherapy   Patient is on Treatment Plan : BREAST METASTATIC Sacituzumab govitecan-hziy Drinda Butts) q21d       CURRENT THERAPY: Drinda Butts  INTERVAL HISTORY: Debbie [name] 56 y.o. female returns for f/u and evaluation prior to receiving [place].  She is tolerating it well.  She is tired, but otherwise doing well today.   Patient Active Problem List   Diagnosis Date Noted   Port-A-Cath in place 08/27/2021   Encounter for antineoplastic chemotherapy 10/08/2020   Lower extremity edema 10/08/2020   Malignant neoplasm metastatic to lung [facility]) 09/10/2020   Goals of care, counseling/discussion 12/27/2018   Osteonecrosis (HCC) 11/01/2018   Pathological fracture in neoplastic disease, left femur, initial encounter for fracture (HCC) 01/28/2018   Morbid obesity with BMI of 40.0-44.9, adult (HCC) 10/12/2017   IUD complication (HCC) 09/01/2017   Bone metastases 08/13/2017   Pain from bone metastases (HCC) 08/13/2017   Malignant neoplasm metastatic to liver (HCC) 08/13/2017   Steatosis of liver 08/13/2017   Bilateral malignant neoplasm of breast in female, estrogen receptor positive (HCC) 12/26/2015   Malignant neoplasm of central portion of left breast in female, estrogen receptor positive (HCC) 11/28/2015    Malignant neoplasm of upper-inner quadrant of right breast in female, estrogen receptor positive (HCC) 10/17/2015   Hypertension 04/19/2012   Hypothyroid 04/19/2012   Fibroids 04/19/2012   Stress incontinence, female 04/19/2012    is allergic to ace inhibitors, atorvastatin, simvastatin, and percocet [oxycodone-acetaminophen].  MEDICAL HISTORY: Past Medical History:  Diagnosis Date   Anxiety    Arthritis    Bone metastasis    Breast cancer of upper-inner quadrant of right female breast [facility]) oncologist-  dr Darnelle Catalan--- 04/ 2019 ER/PR+ Stage IV w/ metastatic disease to liver, total spine, lung, bone   dx 06/ 2017  right breast invasive ductal carcinoma, Stage IIB, ER/PR + (pT2 pN1), grade 1-- s/p  right breast lumpecotmy w/ sln bx's and bilateral breast reduction 11-28-2015/  left breast with reduction , dx invasive ductal ca, Grade 1 (pT1cpNX) ER/PR positive/  adjuvant radiation completed 03-27-2016 to right chest wall, 2018  reclassifiec Stage IIA   Cancer, metastatic to liver (HCC)    Chronic back pain    Constipation    Depression    Family history of adverse reaction to anesthesia     mother has Copd was kept on vent    Headache    Heart murmur    History of hyperthyroidism    s/p  RAI   History of radiation therapy 02/11/16- 03/27/16   Right Chest Wall 50.4 Gy in 28 fractions, Right Chest Wall Boost 10 Gy in 5 fractions.    Hyperlipidemia    Hypertension  Hypothyroidism, postradioiodine therapy    endocrinoloigst-  dr balan--  2004  s/p  RAI   Menorrhagia 2011   Multiple pulmonary nodules    secondary  metastatic   PONV (postoperative nausea and vomiting)    patch helps    SUI (stress urinary incontinence, female)    Wears contact lenses    Wears dentures    full upper and lower partial    SURGICAL HISTORY: Past Surgical History:  Procedure Laterality Date   BREAST LUMPECTOMY WITH NEEDLE LOCALIZATION AND AXILLARY SENTINEL LYMPH NODE BX Right 11/28/2015    Procedure: RIGHT BREAST LUMPECTOMY WITH NEEDLE LOCALIZATION AND AXILLARY SENTINEL LYMPH NODE BX;  Surgeon: Autumn Messing III, MD;  Location: [place];  Service: General;  Laterality: Right;   BREAST REDUCTION SURGERY Bilateral 11/28/2015   Procedure: MAMMARY REDUCTION  (BREAST) BILATERAL;  Surgeon: Crissie Reese, MD;  Location: [place];  Service: Plastics;  Laterality: Bilateral;   CESAREAN SECTION  01/12/1993   COLONOSCOPY     Dental surgeries     FEMUR IM NAIL Left 01/28/2018   Procedure: INTRAMEDULLARY (IM) NAIL FEMORAL;  Surgeon: Nicholes Stairs, MD;  Location: [place];  Service: Orthopedics;  Laterality: Left;   HYSTEROSCOPY N/A 09/02/2017   Procedure: HYSTEROSCOPY  to remove IUD;  Surgeon: Eldred Manges, MD;  Location: [place];  Service: Gynecology;  Laterality: N/A;   IR IMAGING GUIDED PORT INSERTION  09/16/2020   Liver biospy     MASTECTOMY     MASTECTOMY W/ SENTINEL NODE BIOPSY Bilateral 12/26/2015   Procedure: BILATERAL MASTECTOMY WITH LEFT SENTINEL LYMPH NODE BIOPSY;  Surgeon: Autumn Messing III, MD;  Location: [place];  Service: General;  Laterality: Bilateral;   Removal of IUD     TUBAL LIGATION Bilateral 1995    SOCIAL HISTORY: Social History   Socioeconomic History   Marital status: Married    Spouse name: Not on file   Number of children: Not on file   Years of education: Not on file   Highest education level: Not on file  Occupational History   Not on file  Tobacco Use   Smoking status: Never   Smokeless tobacco: Never  Vaping Use   Vaping Use: Never used  Substance and Sexual Activity   Alcohol use: No   Drug use: No   Sexual activity: Not on file    Comment: BTL   Other Topics Concern   Not on file  Social History Narrative   Not on file   Social Determinants of Health   Financial Resource Strain: Not on file  Food Insecurity: Not on file  Transportation Needs: No Transportation Needs (04/01/2018)   PRAPARE - Transportation    Lack of  Transportation (Medical): No    Lack of Transportation (Non-Medical): No  Physical Activity: Not on file  Stress: Not on file  Social Connections: Not on file  Intimate Partner Violence: Not At Risk (01/18/2018)   Humiliation, Afraid, Rape, and Kick questionnaire    Fear of Current or Ex-Partner: No    Emotionally Abused: No    Physically Abused: No    Sexually Abused: No    FAMILY HISTORY: Family History  Problem Relation Age of Onset   Diabetes Mother    Hypertension Mother    Hypertension Father    Diabetes Maternal Grandmother    Hypertension Sister    Cancer Paternal Grandmother        colon   Colon cancer Paternal Grandmother  Esophageal cancer Neg Hx    Stomach cancer Neg Hx    Rectal cancer Neg Hx     Review of Systems  Constitutional:  Negative for appetite change, chills, fatigue, fever and unexpected weight change.  HENT:   Negative for hearing loss, lump/mass and trouble swallowing.   Eyes:  Negative for eye problems and icterus.  Respiratory:  Negative for chest tightness, cough and shortness of breath.   Cardiovascular:  Negative for chest pain, leg swelling and palpitations.  Gastrointestinal:  Negative for abdominal distention, abdominal pain, constipation, diarrhea, nausea and vomiting.  Endocrine: Negative for hot flashes.  Genitourinary:  Negative for difficulty urinating.   Musculoskeletal:  Negative for arthralgias.  Skin:  Negative for itching and rash.  Neurological:  Negative for dizziness, extremity weakness, headaches and numbness.  Hematological:  Negative for adenopathy. Does not bruise/bleed easily.  Psychiatric/Behavioral:  Negative for depression. The patient is not nervous/anxious.       PHYSICAL EXAMINATION  ECOG PERFORMANCE STATUS: 1 - Symptomatic but completely ambulatory  Vitals:   12/11/21 0810  BP: (!) 155/82  Pulse: (!) 106  Resp: 18  Temp: (!) 97.3 F (36.3 C)  SpO2: 96%    Physical Exam Constitutional:       General: She is not in acute distress.    Appearance: Normal appearance. She is not toxic-appearing.  HENT:     Head: Normocephalic and atraumatic.  Eyes:     General: No scleral icterus. Cardiovascular:     Rate and Rhythm: Normal rate and regular rhythm.     Pulses: Normal pulses.     Heart sounds: Normal heart sounds.  Pulmonary:     Effort: Pulmonary effort is normal.     Breath sounds: Normal breath sounds.  Abdominal:     General: Abdomen is flat. Bowel sounds are normal. There is no distension.     Palpations: Abdomen is soft.     Tenderness: There is no abdominal tenderness.  Musculoskeletal:        General: No swelling.     Cervical back: Neck supple.  Lymphadenopathy:     Cervical: No cervical adenopathy.  Skin:    General: Skin is warm and dry.     Findings: No rash.  Neurological:     General: No focal deficit present.     Mental Status: She is alert.  Psychiatric:        Mood and Affect: Mood normal.        Behavior: Behavior normal.     LABORATORY DATA:  CBC    Component Value Date/Time   WBC 4.2 12/11/2021 0749   WBC 7.2 07/16/2021 1030   RBC 3.48 (L) 12/11/2021 0749   HGB 10.7 (L) 12/11/2021 0749   HGB 13.2 07/08/2016 0948   HCT 32.5 (L) 12/11/2021 0749   HCT 39.9 07/08/2016 0948   PLT 224 12/11/2021 0749   PLT 276 07/08/2016 0948   MCV 93.4 12/11/2021 0749   MCV 88.1 07/08/2016 0948   MCH 30.7 12/11/2021 0749   MCHC 32.9 12/11/2021 0749   RDW 18.0 (H) 12/11/2021 0749   RDW 14.4 07/08/2016 0948   LYMPHSABS 0.6 (L) 12/11/2021 0749   LYMPHSABS 1.1 07/08/2016 0948   MONOABS 0.4 12/11/2021 0749   MONOABS 0.4 07/08/2016 0948   EOSABS 0.1 12/11/2021 0749   EOSABS 0.1 07/08/2016 0948   BASOSABS 0.0 12/11/2021 0749   BASOSABS 0.0 07/08/2016 0948    CMP     Component Value Date/Time  NA 141 12/11/2021 0749   NA 140 07/08/2016 0948   K 3.4 (L) 12/11/2021 0749   K 4.1 07/08/2016 0948   CL 103 12/11/2021 0749   CO2 32 12/11/2021 0749   CO2  24 07/08/2016 0948   GLUCOSE 112 (H) 12/11/2021 0749   GLUCOSE 89 07/08/2016 0948   BUN 11 12/11/2021 0749   BUN 11.9 07/08/2016 0948   CREATININE 0.73 12/11/2021 0749   CREATININE 0.9 07/08/2016 0948   CALCIUM 9.1 12/11/2021 0749   CALCIUM 10.0 07/08/2016 0948   PROT 6.7 12/11/2021 0749   PROT 7.6 07/08/2016 0948   ALBUMIN 3.7 12/11/2021 0749   ALBUMIN 3.6 07/08/2016 0948   AST 38 12/11/2021 0749   AST 22 07/08/2016 0948   ALT 48 (H) 12/11/2021 0749   ALT 27 07/08/2016 0948   ALKPHOS 185 (H) 12/11/2021 0749   ALKPHOS 115 07/08/2016 0948   BILITOT 0.3 12/11/2021 0749   BILITOT 0.35 07/08/2016 0948   GFRNONAA >60 12/11/2021 0749   GFRAA >60 01/30/2020 0814   GFRAA >60 05/16/2019 0905      ASSESSMENT and THERAPY PLAN:   Malignant neoplasm of upper-inner quadrant of right breast in female, estrogen receptor positive ([place]) Debbie [name] is a 56 year old woman with metastatic breast cancer on treatment with [place].    She is doing moderately well today other than fatigue and increased appetite.  She has no clinical signs of metastatic breast cancer progression.  We reviewed her labs in detail and she will proceed with treatment today.  Her next imaging with CT chest, abdomen, and pelvis will be completed on 12/30/2021.  I have requested f/u with Dr. Lindi Adie for 01/01/2022.    All questions were answered. The patient knows to call the clinic with any problems, questions or concerns. We can certainly see the patient much sooner if necessary.  Total encounter time:30 minutes*in face-to-face visit time, chart review, lab review, care coordination, order entry, and documentation of the encounter time.    Wilber Bihari, NP 12/11/21 2:51 PM Medical Oncology and Hematology [facility] [place], [place] [address] Tel. [phone]    Fax. [phone]  *Total Encounter Time as defined by the Centers for Medicare and Medicaid Services includes, in addition to the  face-to-face time of a patient visit (documented in the note above) non-face-to-face time: obtaining and reviewing outside history, ordering and reviewing medications, tests or procedures, care coordination (communications with other health care professionals or caregivers) and documentation in the medical record.

## 2021-12-11 NOTE — Assessment & Plan Note (Signed)
Debbie [name] is a 56 year old woman with metastatic breast cancer on treatment with [place].    She is doing moderately well today other than fatigue and increased appetite.  She has no clinical signs of metastatic breast cancer progression.  We reviewed her labs in detail and she will proceed with treatment today.  Her next imaging with CT chest, abdomen, and pelvis will be completed on 12/30/2021.  I have requested f/u with Dr. Lindi Adie for 01/01/2022.

## 2021-12-11 NOTE — Progress Notes (Signed)
Pt declined to be observed for 30 minutes post Trodelvy infusion. Pt tolerated trtmt well w/out incident. VSS at discharge.  Ambulatory to lobby.   

## 2021-12-12 ENCOUNTER — Other Ambulatory Visit: Payer: 59

## 2021-12-12 ENCOUNTER — Other Ambulatory Visit (HOSPITAL_COMMUNITY): Payer: Self-pay

## 2021-12-12 ENCOUNTER — Ambulatory Visit: Payer: 59

## 2021-12-12 ENCOUNTER — Telehealth: Payer: Self-pay | Admitting: Hematology and Oncology

## 2021-12-12 LAB — CANCER ANTIGEN 27.29: CA 27.29: 210.1 U/mL — ABNORMAL HIGH (ref 0.0–38.6)

## 2021-12-12 MED ORDER — LOSARTAN POTASSIUM-HCTZ 100-25 MG PO TABS
1.0000 | ORAL_TABLET | Freq: Every day | ORAL | 0 refills | Status: DC
  Filled 2021-12-12: qty 90, 90d supply, fill #0

## 2021-12-12 NOTE — Telephone Encounter (Signed)
Scheduled appointment per 8/3 and updated appointments per 8/4 secure chat. Left voicemail.

## 2021-12-13 ENCOUNTER — Other Ambulatory Visit: Payer: Self-pay

## 2021-12-16 ENCOUNTER — Other Ambulatory Visit (HOSPITAL_COMMUNITY): Payer: Self-pay

## 2021-12-17 ENCOUNTER — Other Ambulatory Visit (HOSPITAL_COMMUNITY): Payer: Self-pay

## 2021-12-17 ENCOUNTER — Other Ambulatory Visit: Payer: Self-pay

## 2021-12-17 MED FILL — Dexamethasone Sodium Phosphate Inj 100 MG/10ML: INTRAMUSCULAR | Qty: 1 | Status: AC

## 2021-12-17 MED FILL — Fosaprepitant Dimeglumine For IV Infusion 150 MG (Base Eq): INTRAVENOUS | Qty: 5 | Status: AC

## 2021-12-17 NOTE — Assessment & Plan Note (Signed)
Metastatic Breast Cancer: ER Pos and Her 2Neg(previously progressed on Ibrance, fulvestrant, everolimus, alpelisib, Xeloda) PriorTreatment: Taxol 09/10/2020-06/18/21 Chronic right arm lymphedema IHC: HER2 0 ----------------------------------------------------------------------------------------------------------------------- Current treatment:Palliative treatment with Sacituzumab-Govitecan, today cycle7  Toxicities: 1.Neutropenia: We reducedthe dosage of Trodelvy with cycle 2 2.Fatigue 3.Chemotherapy-induced anemia today's hemoglobin isimproving and today it is 11.3. 4.Nausea:Nausea is now mild 5.Constipation alternating with diarrhea 6.Dryness of the face and acne 7.  Dry cough with walking: Encouraged her to take Pepcid twice a day to see if it is acid reflux related versus Claritin.   CT CAP 09/16/2021: Slight interval decrease in the size of the enlarged pretracheal, subcarinal, right hilar lymph nodes. Numerous small bilateral lung nodules unchanged. Numerous lesions in the liver poorly visualized. Unchanged bone metastases. Tumor markers are also coming down Next scans will be in August 22nd    Return to clinic for Trodelvy day 1 day 8 every 3 weeks.

## 2021-12-18 ENCOUNTER — Other Ambulatory Visit: Payer: Self-pay

## 2021-12-18 ENCOUNTER — Inpatient Hospital Stay (HOSPITAL_BASED_OUTPATIENT_CLINIC_OR_DEPARTMENT_OTHER): Payer: 59 | Admitting: Hematology and Oncology

## 2021-12-18 ENCOUNTER — Inpatient Hospital Stay: Payer: 59

## 2021-12-18 ENCOUNTER — Other Ambulatory Visit (HOSPITAL_COMMUNITY): Payer: Self-pay

## 2021-12-18 DIAGNOSIS — C787 Secondary malignant neoplasm of liver and intrahepatic bile duct: Secondary | ICD-10-CM | POA: Diagnosis not present

## 2021-12-18 DIAGNOSIS — Z79899 Other long term (current) drug therapy: Secondary | ICD-10-CM | POA: Diagnosis not present

## 2021-12-18 DIAGNOSIS — Z170 Estrogen receptor positive status [ER+]: Secondary | ICD-10-CM

## 2021-12-18 DIAGNOSIS — Z95828 Presence of other vascular implants and grafts: Secondary | ICD-10-CM

## 2021-12-18 DIAGNOSIS — C50211 Malignant neoplasm of upper-inner quadrant of right female breast: Secondary | ICD-10-CM

## 2021-12-18 DIAGNOSIS — C7800 Secondary malignant neoplasm of unspecified lung: Secondary | ICD-10-CM | POA: Diagnosis not present

## 2021-12-18 DIAGNOSIS — C7951 Secondary malignant neoplasm of bone: Secondary | ICD-10-CM | POA: Diagnosis not present

## 2021-12-18 DIAGNOSIS — Z9013 Acquired absence of bilateral breasts and nipples: Secondary | ICD-10-CM | POA: Diagnosis not present

## 2021-12-18 DIAGNOSIS — Z5111 Encounter for antineoplastic chemotherapy: Secondary | ICD-10-CM | POA: Diagnosis not present

## 2021-12-18 LAB — CBC WITH DIFFERENTIAL (CANCER CENTER ONLY)
Abs Immature Granulocytes: 0.05 10*3/uL (ref 0.00–0.07)
Basophils Absolute: 0 10*3/uL (ref 0.0–0.1)
Basophils Relative: 1 %
Eosinophils Absolute: 0.1 10*3/uL (ref 0.0–0.5)
Eosinophils Relative: 3 %
HCT: 33.2 % — ABNORMAL LOW (ref 36.0–46.0)
Hemoglobin: 11 g/dL — ABNORMAL LOW (ref 12.0–15.0)
Immature Granulocytes: 1 %
Lymphocytes Relative: 15 %
Lymphs Abs: 0.5 10*3/uL — ABNORMAL LOW (ref 0.7–4.0)
MCH: 30.7 pg (ref 26.0–34.0)
MCHC: 33.1 g/dL (ref 30.0–36.0)
MCV: 92.7 fL (ref 80.0–100.0)
Monocytes Absolute: 0.5 10*3/uL (ref 0.1–1.0)
Monocytes Relative: 15 %
Neutro Abs: 2.3 10*3/uL (ref 1.7–7.7)
Neutrophils Relative %: 65 %
Platelet Count: 244 10*3/uL (ref 150–400)
RBC: 3.58 MIL/uL — ABNORMAL LOW (ref 3.87–5.11)
RDW: 17.7 % — ABNORMAL HIGH (ref 11.5–15.5)
WBC Count: 3.6 10*3/uL — ABNORMAL LOW (ref 4.0–10.5)
nRBC: 0.6 % — ABNORMAL HIGH (ref 0.0–0.2)

## 2021-12-18 LAB — CMP (CANCER CENTER ONLY)
ALT: 61 U/L — ABNORMAL HIGH (ref 0–44)
AST: 48 U/L — ABNORMAL HIGH (ref 15–41)
Albumin: 3.7 g/dL (ref 3.5–5.0)
Alkaline Phosphatase: 196 U/L — ABNORMAL HIGH (ref 38–126)
Anion gap: 4 — ABNORMAL LOW (ref 5–15)
BUN: 11 mg/dL (ref 6–20)
CO2: 31 mmol/L (ref 22–32)
Calcium: 8.7 mg/dL — ABNORMAL LOW (ref 8.9–10.3)
Chloride: 104 mmol/L (ref 98–111)
Creatinine: 0.73 mg/dL (ref 0.44–1.00)
GFR, Estimated: >60 mL/min (ref >60)
Glucose, Bld: 103 mg/dL — ABNORMAL HIGH (ref 70–99)
Potassium: 3.7 mmol/L (ref 3.5–5.1)
Sodium: 139 mmol/L (ref 135–145)
Total Bilirubin: 0.3 mg/dL (ref 0.3–1.2)
Total Protein: 6.9 g/dL (ref 6.5–8.1)

## 2021-12-18 MED ORDER — ATROPINE SULFATE 1 MG/ML IV SOLN
INTRAVENOUS | Status: AC
  Filled 2021-12-18: qty 1

## 2021-12-18 MED ORDER — PALONOSETRON HCL INJECTION 0.25 MG/5ML
INTRAVENOUS | Status: AC
  Filled 2021-12-18: qty 5

## 2021-12-18 MED ORDER — ACETAMINOPHEN 325 MG PO TABS
ORAL_TABLET | ORAL | Status: AC
  Filled 2021-12-18: qty 2

## 2021-12-18 MED ORDER — FAMOTIDINE IN NACL 20-0.9 MG/50ML-% IV SOLN
INTRAVENOUS | Status: AC
  Filled 2021-12-18: qty 50

## 2021-12-18 MED ORDER — SODIUM CHLORIDE 0.9 % IV SOLN
150.0000 mg | Freq: Once | INTRAVENOUS | Status: AC
  Administered 2021-12-18: 150 mg via INTRAVENOUS
  Filled 2021-12-18: qty 150

## 2021-12-18 MED ORDER — FAMOTIDINE IN NACL 20-0.9 MG/50ML-% IV SOLN
20.0000 mg | Freq: Once | INTRAVENOUS | Status: AC
  Administered 2021-12-18: 20 mg via INTRAVENOUS

## 2021-12-18 MED ORDER — ATROPINE SULFATE 1 MG/ML IV SOLN
0.5000 mg | Freq: Once | INTRAVENOUS | Status: AC | PRN
  Administered 2021-12-18: 0.5 mg via INTRAVENOUS

## 2021-12-18 MED ORDER — SODIUM CHLORIDE 0.9% FLUSH
10.0000 mL | INTRAVENOUS | Status: DC | PRN
  Administered 2021-12-18: 10 mL

## 2021-12-18 MED ORDER — DIPHENHYDRAMINE HCL 50 MG/ML IJ SOLN
INTRAMUSCULAR | Status: AC
  Filled 2021-12-18: qty 1

## 2021-12-18 MED ORDER — SODIUM CHLORIDE 0.9 % IV SOLN
6.0000 mg/kg | Freq: Once | INTRAVENOUS | Status: AC
  Administered 2021-12-18: 800 mg via INTRAVENOUS
  Filled 2021-12-18: qty 80

## 2021-12-18 MED ORDER — ACETAMINOPHEN 325 MG PO TABS
650.0000 mg | ORAL_TABLET | Freq: Once | ORAL | Status: AC
  Administered 2021-12-18: 650 mg via ORAL

## 2021-12-18 MED ORDER — DIPHENHYDRAMINE HCL 50 MG/ML IJ SOLN
50.0000 mg | Freq: Once | INTRAMUSCULAR | Status: AC
  Administered 2021-12-18: 50 mg via INTRAVENOUS

## 2021-12-18 MED ORDER — HEPARIN SOD (PORK) LOCK FLUSH 100 UNIT/ML IV SOLN
500.0000 [IU] | Freq: Once | INTRAVENOUS | Status: AC | PRN
  Administered 2021-12-18: 500 [IU]

## 2021-12-18 MED ORDER — SODIUM CHLORIDE 0.9% FLUSH
10.0000 mL | Freq: Once | INTRAVENOUS | Status: AC
  Administered 2021-12-18: 10 mL

## 2021-12-18 MED ORDER — SODIUM CHLORIDE 0.9 % IV SOLN: Freq: Once | INTRAVENOUS | Status: AC

## 2021-12-18 MED ORDER — SODIUM CHLORIDE 0.9 % IV SOLN
10.0000 mg | Freq: Once | INTRAVENOUS | Status: AC
  Administered 2021-12-18: 10 mg via INTRAVENOUS
  Filled 2021-12-18: qty 10

## 2021-12-18 MED ORDER — PALONOSETRON HCL INJECTION 0.25 MG/5ML
0.2500 mg | Freq: Once | INTRAVENOUS | Status: AC
  Administered 2021-12-18: 0.25 mg via INTRAVENOUS

## 2021-12-18 NOTE — Patient Instructions (Signed)
[facility] MEDICAL ONCOLOGY  Discharge Instructions: Thank you for choosing [facility] to provide your oncology and hematology care.   If you have a lab appointment with the Cancer Center, please go directly to the Cancer Center and check in at the registration area.   Wear comfortable clothing and clothing appropriate for easy access to any Portacath or PICC line.   We strive to give you quality time with your provider. You may need to reschedule your appointment if you arrive late (15 or more minutes).  Arriving late affects you and other patients whose appointments are after yours.  Also, if you miss three or more appointments without notifying the office, you may be dismissed from the clinic at the provider's discretion.      For prescription refill requests, have your pharmacy contact our office and allow 72 hours for refills to be completed.    Today you received the following chemotherapy and/or immunotherapy agents: Trodelvy      To help prevent nausea and vomiting after your treatment, we encourage you to take your nausea medication as directed.  BELOW ARE SYMPTOMS THAT SHOULD BE REPORTED IMMEDIATELY: *FEVER GREATER THAN 100.4 F (38 C) OR HIGHER *CHILLS OR SWEATING *NAUSEA AND VOMITING THAT IS NOT CONTROLLED WITH YOUR NAUSEA MEDICATION *UNUSUAL SHORTNESS OF BREATH *UNUSUAL BRUISING OR BLEEDING *URINARY PROBLEMS (pain or burning when urinating, or frequent urination) *BOWEL PROBLEMS (unusual diarrhea, constipation, pain near the anus) TENDERNESS IN MOUTH AND THROAT WITH OR WITHOUT PRESENCE OF ULCERS (sore throat, sores in mouth, or a toothache) UNUSUAL RASH, SWELLING OR PAIN  UNUSUAL VAGINAL DISCHARGE OR ITCHING   Items with * indicate a potential emergency and should be followed up as soon as possible or go to the Emergency Department if any problems should occur.  Please show the CHEMOTHERAPY ALERT CARD or IMMUNOTHERAPY ALERT CARD at check-in to  the Emergency Department and triage nurse.  Should you have questions after your visit or need to cancel or reschedule your appointment, please contact [facility] MEDICAL ONCOLOG[facility]  Dept: [phone]  and follow the prompts.  Office hours are 8:00 a.m. to 4:30 p.m. Monday - Friday. Please note that voicemails left after 4:00 p.m. may not be returned until the following business day.  We are closed weekends and major holidays. You have access to a nurse at all times for urgent questions. Please call the main number to the clinic Dept: [phone] and follow the prompts.   For any non-urgent questions, you may also contact your provider using MyChart. We now offer e-Visits for anyone 18 and older to request care online for non-urgent symptoms. For details visit [email or web address].   Also download the MyChart app! Go to the app store, search "MyChart", open the app, select [facility], and log in with your MyChart username and password.  Masks are optional in the cancer centers. If you would like for your care team to wear a mask while they are taking care of you, please let them know. You may have one support person who is at least 58 years old accompany you for your appointments. 

## 2021-12-19 ENCOUNTER — Other Ambulatory Visit (HOSPITAL_COMMUNITY): Payer: Self-pay

## 2021-12-19 LAB — CANCER ANTIGEN 27.29: CA 27.29: 214.2 U/mL — ABNORMAL HIGH (ref 0.0–38.6)

## 2021-12-28 NOTE — Progress Notes (Signed)
Patient Care Team: Kelton Pillar, MD as PCP - General (Family Medicine) Jacelyn Pi, MD as Consulting Physician (Endocrinology) Jovita Kussmaul, MD as Consulting Physician (General Surgery) [name], MD as Attending Physician (Radiation Oncology) Belva Crome, MD as Consulting Physician (Cardiology) [name] [name], NP as Nurse Practitioner (Hematology and Oncology) Lajoyce Lauber, DMD (Dentistry) Raina Mina, RPH-CPP (Pharmacist) Nicholas Lose, MD as Consulting Physician (Hematology and Oncology)  DIAGNOSIS: No diagnosis found.  SUMMARY OF ONCOLOGIC HISTORY: Oncology History  Malignant neoplasm of upper-inner quadrant of right breast in female, estrogen receptor positive ([place])  10/15/2015 Initial Biopsy   Right breast upper inner quadrant biopsy: IDC, DCIS, grade 2, ER+(90%), PR+(50%), Ki67 70%, HER-2 negative (ratio 1.18).   10/17/2015 Initial Diagnosis   Malignant neoplasm of upper-inner quadrant of right breast in female, estrogen receptor positive ([place])   10/2015 -  Anti-estrogen oral therapy   Tamoxifen daily   11/28/2015 Surgery   Left breast mammoplasty: IDC, grade 1, 1.2cm, DCIS, lobular neoplasia, T1c, Nx, ER 80%, PR60%, Ki67 3% HER-2 neg Right breast lumpectomy: IDC, grade 1, 2.3cm, DCIS. T2N1a ER/PR+HER-2 neg, Ki 70%.   11/28/2015 Miscellaneous   Mammaprint was Low Risk. Predicting a 98% chance of no disease recurrence within 5 years with anti-estrogens as the only systemic therapy.  It also predicts minimal to no benefit from chemotherapy.     12/26/2015 Surgery   Bilateral mastectomies: bilateral sentinel node sampling, Right: no residual carcinoma, left, focal DCIS, 4 SLN negative   02/11/2016 - 03/27/2016 Radiation Therapy   Adjuvant radiation [name]): 1) Right chest wall: 50.4 Gy in 28 fractions  2) Right chest wall boost: 10 Gy in 5 fractions   08/04/2017 Relapse/Recurrence   08/04/2017, MRI of the liver and total spine MRI 08/11/2017  showed liver and bone metastasis; liver biopsy 09/07/2017 confirms estrogen receptor positive, progesterone receptor negative, HER-2 negative metastatic breast cancer; chest CT scan 08/13/2017 shows multiple bilateral pulmonary nodules.   08/17/2017 - 01/04/2018 Anti-estrogen oral therapy   fulvestrant  palbociclib started 08/31/2017 discontinued 01/04/2018 with evidence of progression in the liver   08/2017 Miscellaneous   [facility] report on liver biopsy from April 2019 shows a negative PDL 1, stable MSI and proficient mismatch repair status.  The tumor is positive for the androgen receptor, and for PTEN for PIK3 CA mutations.  HER-2/neu was read as equivocal (it was negative by FISH on the pathology report).   01/04/2018 Miscellaneous   anastrozole started 01/04/2018 everolimus started 01/29/2018   02/16/2018 - 03/01/2018 Radiation Therapy   01/28/18 prophylactic left femur intramedullary nail surgery for impending left femur pathologic fracture alliative radiation with Dr. [name] to the left femur and left ilium from 02/16/2018 to 03/01/2018   01/30/2020 - 05/2020 Anti-estrogen oral therapy   alpelisib started 01/30/2020 at 300 mg daily with Metformin 500 mg daily and Faslodex (stopped for progression)   05/22/2020 - 08/2020 Chemotherapy   Xeloda discontinued for progression   09/10/2020 - 06/18/2021 Chemotherapy   Patient is on Treatment Plan : BREAST Paclitaxel D1,D15 q28d     07/17/2021 -  Chemotherapy   Patient is on Treatment Plan : BREAST METASTATIC Sacituzumab govitecan-hziy Debbie [name]) q21d     Bilateral malignant neoplasm of breast in female, estrogen receptor positive ([place])  12/26/2015 Initial Diagnosis   Bilateral malignant neoplasm of breast in female, estrogen receptor positive ([place])   07/17/2021 -  Chemotherapy   Patient is on Treatment Plan : BREAST METASTATIC Sacituzumab govitecan-hziy Debbie [name]) q21d  Malignant neoplasm metastatic to liver ([place])  08/13/2017 Initial Diagnosis    Liver metastases ([place])   07/17/2021 -  Chemotherapy   Patient is on Treatment Plan : BREAST METASTATIC Sacituzumab govitecan-hziy Debbie [name]) q21d     Malignant neoplasm metastatic to lung ([place])  09/10/2020 Initial Diagnosis   Lung metastases ([place])   07/17/2021 -  Chemotherapy   Patient is on Treatment Plan : BREAST METASTATIC Sacituzumab govitecan-hziy Debbie [name]) q21d       CHIEF COMPLIANT: : Follow up Debbie [name]   INTERVAL HISTORY: Debbie [name] is a 56 year old with above-mentioned history of metastatic breast cancer. She presents to the clinic for a follow-up.     ALLERGIES:  is allergic to ace inhibitors, atorvastatin, simvastatin, and percocet [oxycodone-acetaminophen].  MEDICATIONS:  Current Outpatient Medications  Medication Sig Dispense Refill   acetaminophen (TYLENOL) 500 MG tablet Take 1,000 mg by mouth every 6 (six) hours as needed.     Calcium-Magnesium 250-125 MG TABS Take 1 tablet by mouth daily. 120 each 4   cetirizine (ZYRTEC) 10 MG tablet Take 10 mg by mouth daily as needed for allergies.      chlorhexidine (PERIDEX) 0.12 % solution Use 5 mLs in the mouth or throat 2 (two) times daily as needed as directed. ****Do not swallow**** 473 mL 6   diphenoxylate-atropine (LOMOTIL) 2.5-0.025 MG tablet Take 1 tablet by mouth 4 (four) times daily as needed for diarrhea or loose stools. 30 tablet 2   doxycycline (VIBRA-TABS) 100 MG tablet Take 1 tablet (100 mg total) by mouth daily. 30 tablet 2   fluconazole (DIFLUCAN) 100 MG tablet Take 1 tablet (100 mg total) by mouth daily. 7 tablet 0   furosemide (LASIX) 20 MG tablet Take 1 tablet (20 mg total) by mouth daily. 90 tablet 6   levothyroxine (SYNTHROID) 175 MCG tablet Take 1 tablet (175 mcg total) by mouth in the morning on an empty stomach (Patient taking differently: Pt taking 6 days in a row, skip 7th day.) 30 tablet 6   losartan-hydrochlorothiazide (HYZAAR) 100-25 MG tablet Take 1 tablet by mouth once a day 90 tablet 0    metFORMIN (GLUCOPHAGE-XR) 500 MG 24 hr tablet Take 1 tablet by mouth  with evening meal 90 tablet 1   Multiple Vitamin (MULTIVITAMIN) tablet Take 1 tablet by mouth daily.     omega-3 acid ethyl esters (LOVAZA) 1 g capsule Take 1 g by mouth 2 (two) times daily.      omeprazole (PRILOSEC) 40 MG capsule Take 1 capsule (40 mg total) by mouth daily. 30 capsule 3   ondansetron (ZOFRAN-ODT) 8 MG disintegrating tablet Dissolve 1 tablet (8 mg total) by mouth every 8 (eight) hours as needed for nausea or vomiting. 20 tablet 0   oxyCODONE (OXY IR/ROXICODONE) 5 MG immediate release tablet Take 1 tablet (5 mg total) by mouth every 4 (four) hours as needed for severe pain. 60 tablet 0   potassium chloride 20 MEQ/15ML (10%) SOLN Take 30 mLs by mouth daily. 473 mL 0   pregabalin (LYRICA) 100 MG capsule Take 1 capsule by mouth 3 times daily. 90 capsule 3   rosuvastatin (CRESTOR) 5 MG tablet Take 1 tablet (5 mg total) by mouth daily. 90 tablet 1   sertraline (ZOLOFT) 100 MG tablet TAKE 1 TABLET BY MOUTH ONCE DAILY 30 tablet 12   valACYclovir (VALTREX) 1000 MG tablet Take 1 tablet (1,000 mg total) by mouth daily. 60 tablet 6   Vitamin D, Cholecalciferol, 1000 units CAPS Take 1 tablet by  mouth daily. (Patient taking differently: Take 1,000 Units by mouth daily.) 90 capsule 4   No current facility-administered medications for this visit.    PHYSICAL EXAMINATION: ECOG PERFORMANCE STATUS: {CHL ONC ECOG [phone]}  There were no vitals filed for this visit. There were no vitals filed for this visit.  BREAST:*** No palpable masses or nodules in either right or left breasts. No palpable axillary supraclavicular or infraclavicular adenopathy no breast tenderness or nipple discharge. (exam performed in the presence of a chaperone)  LABORATORY DATA:  I have reviewed the data as listed    Latest Ref Rng & Units 12/18/2021    8:09 AM 12/11/2021    7:49 AM 11/27/2021    8:02 AM  CMP  Glucose 70 - 99 mg/dL 103  112   114   BUN 6 - 20 mg/dL '11  11  12   ' Creatinine 0.44 - 1.00 mg/dL 0.73  0.73  0.70   Sodium 135 - 145 mmol/L 139  141  139   Potassium 3.5 - 5.1 mmol/L 3.7  3.4  3.8   Chloride 98 - 111 mmol/L 104  103  104   CO2 22 - 32 mmol/L 31  32  29   Calcium 8.9 - 10.3 mg/dL 8.7  9.1  8.9   Total Protein 6.5 - 8.1 g/dL 6.9  6.7  6.6   Total Bilirubin 0.3 - 1.2 mg/dL 0.3  0.3  0.3   Alkaline Phos 38 - 126 U/L 196  185  174   AST 15 - 41 U/L 48  38  41   ALT 0 - 44 U/L 61  48  49     Lab Results  Component Value Date   WBC 3.6 (L) 12/18/2021   HGB 11.0 (L) 12/18/2021   HCT 33.2 (L) 12/18/2021   MCV 92.7 12/18/2021   PLT 244 12/18/2021   NEUTROABS 2.3 12/18/2021    ASSESSMENT & PLAN:  No problem-specific Assessment & Plan notes found for this encounter.    No orders of the defined types were placed in this encounter.  The patient has a good understanding of the overall plan. she agrees with it. she will call with any problems that may develop before the next visit here. Total time spent: 30 mins including face to face time and time spent for planning, charting and co-ordination of care   Suzzette Righter, [place] 12/28/21    I Gardiner Coins am scribing for Dr. Lindi Adie  ***

## 2021-12-30 ENCOUNTER — Encounter (HOSPITAL_COMMUNITY): Payer: Self-pay

## 2021-12-30 ENCOUNTER — Ambulatory Visit (HOSPITAL_COMMUNITY)
Admission: RE | Admit: 2021-12-30 | Discharge: 2021-12-30 | Disposition: A | Discharge status: Home / Self Care | Payer: 59 | Source: Ambulatory Visit | Attending: Hematology and Oncology | Admitting: Hematology and Oncology

## 2021-12-30 DIAGNOSIS — C50211 Malignant neoplasm of upper-inner quadrant of right female breast: Secondary | ICD-10-CM | POA: Diagnosis not present

## 2021-12-30 DIAGNOSIS — C50919 Malignant neoplasm of unspecified site of unspecified female breast: Secondary | ICD-10-CM | POA: Diagnosis not present

## 2021-12-30 DIAGNOSIS — K7689 Other specified diseases of liver: Secondary | ICD-10-CM | POA: Diagnosis not present

## 2021-12-30 DIAGNOSIS — Z170 Estrogen receptor positive status [ER+]: Secondary | ICD-10-CM | POA: Insufficient documentation

## 2021-12-30 DIAGNOSIS — C7951 Secondary malignant neoplasm of bone: Secondary | ICD-10-CM | POA: Diagnosis not present

## 2021-12-30 DIAGNOSIS — R918 Other nonspecific abnormal finding of lung field: Secondary | ICD-10-CM | POA: Diagnosis not present

## 2021-12-30 DIAGNOSIS — C787 Secondary malignant neoplasm of liver and intrahepatic bile duct: Secondary | ICD-10-CM | POA: Diagnosis not present

## 2021-12-30 MED ORDER — HEPARIN SOD (PORK) LOCK FLUSH 100 UNIT/ML IV SOLN
INTRAVENOUS | Status: AC
  Filled 2021-12-30: qty 5

## 2021-12-30 MED ORDER — IOHEXOL 300 MG/ML  SOLN
100.0000 mL | Freq: Once | INTRAMUSCULAR | Status: AC | PRN
  Administered 2021-12-30: 100 mL via INTRAVENOUS

## 2021-12-30 MED ORDER — SODIUM CHLORIDE (PF) 0.9 % IJ SOLN
INTRAMUSCULAR | Status: AC
  Filled 2021-12-30: qty 50

## 2021-12-31 MED FILL — Fosaprepitant Dimeglumine For IV Infusion 150 MG (Base Eq): INTRAVENOUS | Qty: 5 | Status: AC

## 2021-12-31 MED FILL — Dexamethasone Sodium Phosphate Inj 100 MG/10ML: INTRAMUSCULAR | Qty: 1 | Status: AC

## 2022-01-01 ENCOUNTER — Inpatient Hospital Stay: Payer: 59

## 2022-01-01 ENCOUNTER — Other Ambulatory Visit (HOSPITAL_COMMUNITY): Payer: Self-pay

## 2022-01-01 ENCOUNTER — Inpatient Hospital Stay (HOSPITAL_BASED_OUTPATIENT_CLINIC_OR_DEPARTMENT_OTHER): Payer: 59 | Admitting: Hematology and Oncology

## 2022-01-01 ENCOUNTER — Other Ambulatory Visit: Payer: Self-pay

## 2022-01-01 VITALS — BP 114/64 | HR 88 | Temp 98.7°F | Resp 16

## 2022-01-01 VITALS — BP 145/76 | HR 120 | Temp 97.7°F | Resp 18 | Ht 66.0 in | Wt 290.9 lb

## 2022-01-01 DIAGNOSIS — C50111 Malignant neoplasm of central portion of right female breast: Secondary | ICD-10-CM

## 2022-01-01 DIAGNOSIS — C7800 Secondary malignant neoplasm of unspecified lung: Secondary | ICD-10-CM

## 2022-01-01 DIAGNOSIS — C50211 Malignant neoplasm of upper-inner quadrant of right female breast: Secondary | ICD-10-CM

## 2022-01-01 DIAGNOSIS — C787 Secondary malignant neoplasm of liver and intrahepatic bile duct: Secondary | ICD-10-CM

## 2022-01-01 DIAGNOSIS — Z5111 Encounter for antineoplastic chemotherapy: Secondary | ICD-10-CM | POA: Diagnosis not present

## 2022-01-01 DIAGNOSIS — Z170 Estrogen receptor positive status [ER+]: Secondary | ICD-10-CM | POA: Diagnosis not present

## 2022-01-01 DIAGNOSIS — Z9013 Acquired absence of bilateral breasts and nipples: Secondary | ICD-10-CM | POA: Diagnosis not present

## 2022-01-01 DIAGNOSIS — Z95828 Presence of other vascular implants and grafts: Secondary | ICD-10-CM

## 2022-01-01 DIAGNOSIS — C50112 Malignant neoplasm of central portion of left female breast: Secondary | ICD-10-CM

## 2022-01-01 DIAGNOSIS — Z79899 Other long term (current) drug therapy: Secondary | ICD-10-CM | POA: Diagnosis not present

## 2022-01-01 DIAGNOSIS — C7951 Secondary malignant neoplasm of bone: Secondary | ICD-10-CM | POA: Diagnosis not present

## 2022-01-01 LAB — CMP (CANCER CENTER ONLY)
ALT: 49 U/L — ABNORMAL HIGH (ref 0–44)
AST: 43 U/L — ABNORMAL HIGH (ref 15–41)
Albumin: 3.6 g/dL (ref 3.5–5.0)
Alkaline Phosphatase: 184 U/L — ABNORMAL HIGH (ref 38–126)
Anion gap: 6 (ref 5–15)
BUN: 7 mg/dL (ref 6–20)
CO2: 31 mmol/L (ref 22–32)
Calcium: 9.1 mg/dL (ref 8.9–10.3)
Chloride: 100 mmol/L (ref 98–111)
Creatinine: 0.7 mg/dL (ref 0.44–1.00)
GFR, Estimated: >60 mL/min (ref >60)
Glucose, Bld: 129 mg/dL — ABNORMAL HIGH (ref 70–99)
Potassium: 3.2 mmol/L — ABNORMAL LOW (ref 3.5–5.1)
Sodium: 137 mmol/L (ref 135–145)
Total Bilirubin: 0.5 mg/dL (ref 0.3–1.2)
Total Protein: 6.7 g/dL (ref 6.5–8.1)

## 2022-01-01 LAB — CBC WITH DIFFERENTIAL (CANCER CENTER ONLY)
Abs Immature Granulocytes: 0.02 10*3/uL (ref 0.00–0.07)
Basophils Absolute: 0 10*3/uL (ref 0.0–0.1)
Basophils Relative: 0 %
Eosinophils Absolute: 0.1 10*3/uL (ref 0.0–0.5)
Eosinophils Relative: 1 %
HCT: 32.3 % — ABNORMAL LOW (ref 36.0–46.0)
Hemoglobin: 10.8 g/dL — ABNORMAL LOW (ref 12.0–15.0)
Immature Granulocytes: 0 %
Lymphocytes Relative: 9 %
Lymphs Abs: 0.5 10*3/uL — ABNORMAL LOW (ref 0.7–4.0)
MCH: 30.7 pg (ref 26.0–34.0)
MCHC: 33.4 g/dL (ref 30.0–36.0)
MCV: 91.8 fL (ref 80.0–100.0)
Monocytes Absolute: 0.6 10*3/uL (ref 0.1–1.0)
Monocytes Relative: 13 %
Neutro Abs: 3.7 10*3/uL (ref 1.7–7.7)
Neutrophils Relative %: 77 %
Platelet Count: 193 10*3/uL (ref 150–400)
RBC: 3.52 MIL/uL — ABNORMAL LOW (ref 3.87–5.11)
RDW: 18.5 % — ABNORMAL HIGH (ref 11.5–15.5)
WBC Count: 4.9 10*3/uL (ref 4.0–10.5)
nRBC: 0 % (ref 0.0–0.2)

## 2022-01-01 MED ORDER — SODIUM CHLORIDE 0.9 % IV SOLN
6.0000 mg/kg | Freq: Once | INTRAVENOUS | Status: AC
  Administered 2022-01-01: 790 mg via INTRAVENOUS
  Filled 2022-01-01: qty 79

## 2022-01-01 MED ORDER — DIPHENHYDRAMINE HCL 50 MG/ML IJ SOLN
50.0000 mg | Freq: Once | INTRAMUSCULAR | Status: AC
  Administered 2022-01-01: 50 mg via INTRAVENOUS
  Filled 2022-01-01: qty 1

## 2022-01-01 MED ORDER — SODIUM CHLORIDE 0.9 % IV SOLN: Freq: Once | INTRAVENOUS | Status: AC

## 2022-01-01 MED ORDER — AZITHROMYCIN 250 MG PO TABS
ORAL_TABLET | ORAL | 0 refills | Status: DC
  Filled 2022-01-01: qty 6, 5d supply, fill #0

## 2022-01-01 MED ORDER — SODIUM CHLORIDE 0.9% FLUSH
10.0000 mL | Freq: Once | INTRAVENOUS | Status: AC
  Administered 2022-01-01: 10 mL

## 2022-01-01 MED ORDER — SODIUM CHLORIDE 0.9% FLUSH
10.0000 mL | INTRAVENOUS | Status: DC | PRN
  Administered 2022-01-01: 10 mL

## 2022-01-01 MED ORDER — PALONOSETRON HCL INJECTION 0.25 MG/5ML
0.2500 mg | Freq: Once | INTRAVENOUS | Status: AC
  Administered 2022-01-01: 0.25 mg via INTRAVENOUS
  Filled 2022-01-01: qty 5

## 2022-01-01 MED ORDER — SODIUM CHLORIDE 0.9 % IV SOLN
10.0000 mg | Freq: Once | INTRAVENOUS | Status: AC
  Administered 2022-01-01: 10 mg via INTRAVENOUS
  Filled 2022-01-01: qty 10

## 2022-01-01 MED ORDER — ACETAMINOPHEN 325 MG PO TABS
650.0000 mg | ORAL_TABLET | Freq: Once | ORAL | Status: AC
  Administered 2022-01-01: 650 mg via ORAL
  Filled 2022-01-01: qty 2

## 2022-01-01 MED ORDER — FAMOTIDINE IN NACL 20-0.9 MG/50ML-% IV SOLN
20.0000 mg | Freq: Once | INTRAVENOUS | Status: AC
  Administered 2022-01-01: 20 mg via INTRAVENOUS
  Filled 2022-01-01: qty 50

## 2022-01-01 MED ORDER — DENOSUMAB 120 MG/1.7ML ~~LOC~~ SOLN
120.0000 mg | Freq: Once | SUBCUTANEOUS | Status: AC
  Administered 2022-01-01: 120 mg via SUBCUTANEOUS
  Filled 2022-01-01: qty 1.7

## 2022-01-01 MED ORDER — HEPARIN SOD (PORK) LOCK FLUSH 100 UNIT/ML IV SOLN
500.0000 [IU] | Freq: Once | INTRAVENOUS | Status: AC | PRN
  Administered 2022-01-01: 500 [IU]

## 2022-01-01 MED ORDER — SODIUM CHLORIDE 0.9 % IV SOLN
150.0000 mg | Freq: Once | INTRAVENOUS | Status: AC
  Administered 2022-01-01: 150 mg via INTRAVENOUS
  Filled 2022-01-01: qty 150

## 2022-01-01 NOTE — Progress Notes (Signed)
Patient declined to stay for full 30 minute post observation. Vitals stable and patient in no distress upon leaving infusion clinic.

## 2022-01-01 NOTE — Patient Instructions (Signed)
[facility] MEDICAL [facility]COLOGY  Discharge Instructions: Thank you for choosing [facility] to provide your oncology and hematology care.   If you have a lab appointment with the Cancer Center, please go directly to the Cancer Center and check in at the registration area.   Wear comfortable clothing and clothing appropriate for easy access to any Portacath or PICC line.   We strive to give you quality time with your provider. You may need to reschedule your appointment if you arrive late (15 or more minutes).  Arriving late affects you and other patients whose appointments are after yours.  Also, if you miss three or more appointments without notifying the office, you may be dismissed from the clinic at the provider's discretion.      For prescription refill requests, have your pharmacy contact our office and allow 72 hours for refills to be completed.    Today you received the following chemotherapy and/or immunotherapy agents: Trodelvy      To help prevent nausea and vomiting after your treatment, we encourage you to take your nausea medication as directed.  BELOW ARE SYMPTOMS THAT SHOULD BE REPORTED IMMEDIATELY: *FEVER GREATER THAN 100.4 F (38 C) OR HIGHER *CHILLS OR SWEATING *NAUSEA AND VOMITING THAT IS NOT CONTROLLED WITH YOUR NAUSEA MEDICATION *UNUSUAL SHORTNESS OF BREATH *UNUSUAL BRUISING OR BLEEDING *URINARY PROBLEMS (pain or burning when urinating, or frequent urination) *BOWEL PROBLEMS (unusual diarrhea, constipation, pain near the anus) TENDERNESS IN MOUTH AND THROAT WITH OR WITHOUT PRESENCE OF ULCERS (sore throat, sores in mouth, or a toothache) UNUSUAL RASH, SWELLING OR PAIN  UNUSUAL VAGINAL DISCHARGE OR ITCHING   Items with * indicate a potential emergency and should be followed up as soon as possible or go to the Emergency Department if any problems should occur.  Please show the CHEMOTHERAPY ALERT CARD or IMMUNOTHERAPY ALERT CARD at check-in to  the Emergency Department and triage nurse.  Should you have questions after your visit or need to cancel or reschedule your appointment, please contact [facility] MEDICAL [facility]COL[facility]  Dept: [phone]  and follow the prompts.  Office hours are 8:00 a.m. to 4:30 p.m. Monday - Friday. Please note that voicemails left after 4:00 p.m. may not be returned until the following business day.  We are closed weekends and major holidays. You have access to a nurse at all times for urgent questions. Please call the main number to the clinic Dept: [phone] and follow the prompts.   For any non-urgent questions, you may also contact your provider using MyChart. We now offer e-Visits for anyone 18 and older to request care online for non-urgent symptoms. For details visit [email or web address].   Also download the MyChart app! Go to the app store, search "MyChart", open the app, select [facility], and log in with your MyChart username and password.  Masks are optional in the cancer centers. If you would like for your care team to wear a mask while they are taking care of you, please let them know. You may have one support person who is at least 58 years old accompany you for your appointments. 

## 2022-01-01 NOTE — Assessment & Plan Note (Signed)
Metastatic Breast Cancer: ER Pos and Her 2Neg(previously progressed on Ibrance, fulvestrant, everolimus, alpelisib, Xeloda) PriorTreatment: Taxol 09/10/2020-06/18/21 Chronic right arm lymphedema IHC: HER2 0 ----------------------------------------------------------------------------------------------------------------------- Current treatment:Palliative treatment with Sacituzumab-Govitecan, today cycle7  Toxicities: 1.Neutropenia: We reducedthe dosage of Trodelvy with cycle 2 2.Fatigue 3.Chemotherapy-induced anemia today's hemoglobin isimproving and today it is 11 4.Nausea:Nausea is now mild 5.Constipation alternating with diarrhea 6.Dryness of the face and acne     CT CAP 09/16/2021: Slight interval decrease in the size of the enlarged pretracheal, subcarinal, right hilar lymph nodes. Numerous small bilateral lung nodules unchanged. Numerous lesions in the liver poorly visualized. Unchanged bone metastases. CT CAP 12/31/2021: No evidence of progression.  Stable pleural nodules, stable subcarinal lymph node, stable bone metastasis, hepatic metastases unchanged.    Return to clinic for [facility] and follow-ups

## 2022-01-02 ENCOUNTER — Telehealth: Payer: Self-pay | Admitting: Hematology and Oncology

## 2022-01-02 LAB — CANCER ANTIGEN 27.29: CA 27.29: 182.4 U/mL — ABNORMAL HIGH (ref 0.0–38.6)

## 2022-01-02 NOTE — Telephone Encounter (Signed)
Scheduled appointments per 8/24 los. Patient is aware.

## 2022-01-03 ENCOUNTER — Other Ambulatory Visit: Payer: Self-pay

## 2022-01-07 MED FILL — Fosaprepitant Dimeglumine For IV Infusion 150 MG (Base Eq): INTRAVENOUS | Qty: 5 | Status: AC

## 2022-01-07 MED FILL — Dexamethasone Sodium Phosphate Inj 100 MG/10ML: INTRAMUSCULAR | Qty: 1 | Status: AC

## 2022-01-08 ENCOUNTER — Telehealth: Payer: Self-pay | Admitting: *Deleted

## 2022-01-08 ENCOUNTER — Inpatient Hospital Stay: Payer: 59

## 2022-01-08 VITALS — BP 138/74 | HR 94 | Temp 98.7°F | Resp 18 | Ht 66.0 in | Wt 287.5 lb

## 2022-01-08 DIAGNOSIS — C787 Secondary malignant neoplasm of liver and intrahepatic bile duct: Secondary | ICD-10-CM | POA: Diagnosis not present

## 2022-01-08 DIAGNOSIS — C7800 Secondary malignant neoplasm of unspecified lung: Secondary | ICD-10-CM

## 2022-01-08 DIAGNOSIS — C50111 Malignant neoplasm of central portion of right female breast: Secondary | ICD-10-CM

## 2022-01-08 DIAGNOSIS — Z9013 Acquired absence of bilateral breasts and nipples: Secondary | ICD-10-CM | POA: Diagnosis not present

## 2022-01-08 DIAGNOSIS — Z5111 Encounter for antineoplastic chemotherapy: Secondary | ICD-10-CM | POA: Diagnosis not present

## 2022-01-08 DIAGNOSIS — C50211 Malignant neoplasm of upper-inner quadrant of right female breast: Secondary | ICD-10-CM

## 2022-01-08 DIAGNOSIS — C7951 Secondary malignant neoplasm of bone: Secondary | ICD-10-CM | POA: Diagnosis not present

## 2022-01-08 DIAGNOSIS — Z170 Estrogen receptor positive status [ER+]: Secondary | ICD-10-CM | POA: Diagnosis not present

## 2022-01-08 DIAGNOSIS — Z79899 Other long term (current) drug therapy: Secondary | ICD-10-CM | POA: Diagnosis not present

## 2022-01-08 DIAGNOSIS — Z95828 Presence of other vascular implants and grafts: Secondary | ICD-10-CM

## 2022-01-08 LAB — CMP (CANCER CENTER ONLY)
ALT: 34 U/L (ref 0–44)
AST: 31 U/L (ref 15–41)
Albumin: 3.7 g/dL (ref 3.5–5.0)
Alkaline Phosphatase: 189 U/L — ABNORMAL HIGH (ref 38–126)
Anion gap: 5 (ref 5–15)
BUN: 11 mg/dL (ref 6–20)
CO2: 33 mmol/L — ABNORMAL HIGH (ref 22–32)
Calcium: 9.5 mg/dL (ref 8.9–10.3)
Chloride: 102 mmol/L (ref 98–111)
Creatinine: 0.71 mg/dL (ref 0.44–1.00)
GFR, Estimated: >60 mL/min (ref >60)
Glucose, Bld: 115 mg/dL — ABNORMAL HIGH (ref 70–99)
Potassium: 3.4 mmol/L — ABNORMAL LOW (ref 3.5–5.1)
Sodium: 140 mmol/L (ref 135–145)
Total Bilirubin: 0.3 mg/dL (ref 0.3–1.2)
Total Protein: 6.9 g/dL (ref 6.5–8.1)

## 2022-01-08 LAB — CBC WITH DIFFERENTIAL (CANCER CENTER ONLY)
Abs Immature Granulocytes: 0.01 10*3/uL (ref 0.00–0.07)
Basophils Absolute: 0 10*3/uL (ref 0.0–0.1)
Basophils Relative: 1 %
Eosinophils Absolute: 0.1 10*3/uL (ref 0.0–0.5)
Eosinophils Relative: 4 %
HCT: 33.7 % — ABNORMAL LOW (ref 36.0–46.0)
Hemoglobin: 11.1 g/dL — ABNORMAL LOW (ref 12.0–15.0)
Immature Granulocytes: 1 %
Lymphocytes Relative: 24 %
Lymphs Abs: 0.5 10*3/uL — ABNORMAL LOW (ref 0.7–4.0)
MCH: 30.5 pg (ref 26.0–34.0)
MCHC: 32.9 g/dL (ref 30.0–36.0)
MCV: 92.6 fL (ref 80.0–100.0)
Monocytes Absolute: 0.3 10*3/uL (ref 0.1–1.0)
Monocytes Relative: 12 %
Neutro Abs: 1.2 10*3/uL — ABNORMAL LOW (ref 1.7–7.7)
Neutrophils Relative %: 58 %
Platelet Count: 238 10*3/uL (ref 150–400)
RBC: 3.64 MIL/uL — ABNORMAL LOW (ref 3.87–5.11)
RDW: 17.3 % — ABNORMAL HIGH (ref 11.5–15.5)
WBC Count: 2 10*3/uL — ABNORMAL LOW (ref 4.0–10.5)
nRBC: 0 % (ref 0.0–0.2)

## 2022-01-08 LAB — PHOSPHORUS: Phosphorus: 3.6 mg/dL (ref 2.5–4.6)

## 2022-01-08 LAB — MAGNESIUM: Magnesium: 1.8 mg/dL (ref 1.7–2.4)

## 2022-01-08 MED ORDER — FAMOTIDINE IN NACL 20-0.9 MG/50ML-% IV SOLN
20.0000 mg | Freq: Once | INTRAVENOUS | Status: AC
  Administered 2022-01-08: 20 mg via INTRAVENOUS
  Filled 2022-01-08: qty 50

## 2022-01-08 MED ORDER — ACETAMINOPHEN 325 MG PO TABS
650.0000 mg | ORAL_TABLET | Freq: Once | ORAL | Status: AC
  Administered 2022-01-08: 650 mg via ORAL
  Filled 2022-01-08: qty 2

## 2022-01-08 MED ORDER — PALONOSETRON HCL INJECTION 0.25 MG/5ML
0.2500 mg | Freq: Once | INTRAVENOUS | Status: AC
  Administered 2022-01-08: 0.25 mg via INTRAVENOUS
  Filled 2022-01-08: qty 5

## 2022-01-08 MED ORDER — ATROPINE SULFATE 1 MG/ML IV SOLN
0.5000 mg | Freq: Once | INTRAVENOUS | Status: DC | PRN
  Filled 2022-01-08: qty 1

## 2022-01-08 MED ORDER — SODIUM CHLORIDE 0.9% FLUSH
10.0000 mL | INTRAVENOUS | Status: DC | PRN
  Administered 2022-01-08: 10 mL

## 2022-01-08 MED ORDER — SODIUM CHLORIDE 0.9 % IV SOLN
150.0000 mg | Freq: Once | INTRAVENOUS | Status: AC
  Administered 2022-01-08: 150 mg via INTRAVENOUS
  Filled 2022-01-08: qty 150

## 2022-01-08 MED ORDER — DIPHENHYDRAMINE HCL 50 MG/ML IJ SOLN
50.0000 mg | Freq: Once | INTRAMUSCULAR | Status: AC
  Administered 2022-01-08: 50 mg via INTRAVENOUS
  Filled 2022-01-08: qty 1

## 2022-01-08 MED ORDER — HEPARIN SOD (PORK) LOCK FLUSH 100 UNIT/ML IV SOLN
500.0000 [IU] | Freq: Once | INTRAVENOUS | Status: AC | PRN
  Administered 2022-01-08: 500 [IU]

## 2022-01-08 MED ORDER — SODIUM CHLORIDE 0.9 % IV SOLN
10.0000 mg | Freq: Once | INTRAVENOUS | Status: AC
  Administered 2022-01-08: 10 mg via INTRAVENOUS
  Filled 2022-01-08: qty 10

## 2022-01-08 MED ORDER — SODIUM CHLORIDE 0.9 % IV SOLN
6.0000 mg/kg | Freq: Once | INTRAVENOUS | Status: AC
  Administered 2022-01-08: 790 mg via INTRAVENOUS
  Filled 2022-01-08: qty 79

## 2022-01-08 MED ORDER — SODIUM CHLORIDE 0.9% FLUSH
10.0000 mL | Freq: Once | INTRAVENOUS | Status: AC
  Administered 2022-01-08: 10 mL

## 2022-01-08 MED ORDER — SODIUM CHLORIDE 0.9 % IV SOLN: Freq: Once | INTRAVENOUS | Status: AC

## 2022-01-08 NOTE — Progress Notes (Signed)
Patient declined 30 minute post trodelvy observation. VSS and ambulatory to lobby at discharge.

## 2022-01-08 NOTE — Telephone Encounter (Signed)
"  [place] [phone]).  Received letter from [employer] that reads leave expires unless provider information received by 01/16/2022.  Called my claims examiner Anissa earlier but she has not called me back.  Will be out and [employer] will be closed through 01/13/2022.   What do I need to do."  Advised to connect with [employer] 24-hour service representative to confirm desire to renew claim initiated in March 2023 for [employer] to fax request to MD.  Request may be able to be sent in to [employer] without delay.   Sign a new [facility] ROI as these expire within 90-days. Currently no further questions or needs.

## 2022-01-08 NOTE — Patient Instructions (Signed)
[facility]ICAL [facility]COLOG[facility]  Discharge Instructions: Thank you for choosing [facility] to provide your oncology and hematology care.   If you have a lab appointment with the Cancer Center, please go directly to the Cancer Center and check in at the registration area.   Wear comfortable clothing and clothing appropriate for easy access to any Portacath or PICC line.   We strive to give you quality time with your provider. You may need to reschedule your appointment if you arrive late (15 or more minutes).  Arriving late affects you and other patients whose appointments are after yours.  Also, if you miss three or more appointments without notifying the office, you may be dismissed from the clinic at the provider's discretion.      For prescription refill requests, have your pharmacy contact our office and allow 72 hours for refills to be completed.    Today you received the following chemotherapy and/or immunotherapy agents: Trodelvy      To help prevent nausea and vomiting after your treatment, we encourage you to take your nausea medication as directed.  BELOW ARE SYMPTOMS THAT SHOULD BE REPORTED IMMEDIATELY: *FEVER GREATER THAN 100.4 F (38 C) OR HIGHER *CHILLS OR SWEATING *NAUSEA AND VOMITING THAT IS NOT CONTROLLED WITH YOUR NAUSEA MEDICATION *UNUSUAL SHORTNESS OF BREATH *UNUSUAL BRUISING OR BLEEDING *URINARY PROBLEMS (pain or burning when urinating, or frequent urination) *BOWEL PROBLEMS (unusual diarrhea, constipation, pain near the anus) TENDERNESS IN MOUTH AND THROAT WITH OR WITHOUT PRESENCE OF ULCERS (sore throat, sores in mouth, or a toothache) UNUSUAL RASH, SWELLING OR PAIN  UNUSUAL VAGINAL DISCHARGE OR ITCHING   Items with * indicate a potential emergency and should be followed up as soon as possible or go to the Emergency Department if any problems should occur.  Please show the CHEMOTHERAPY ALERT CARD or IMMUNOTHERAPY ALERT CARD at check-in to  the Emergency Department and triage nurse.  Should you have questions after your visit or need to cancel or reschedule your appointment, please contact [facility]COL[facility]  Dept: [phone]  and follow the prompts.  Office hours are 8:00 a.m. to 4:30 p.m. Monday - Friday. Please note that voicemails left after 4:00 p.m. may not be returned until the following business day.  We are closed weekends and major holidays. You have access to a nurse at all times for urgent questions. Please call the main number to the clinic Dept: [phone] and follow the prompts.   For any non-urgent questions, you may also contact your provider using MyChart. We now offer e-Visits for anyone 18 and older to request care online for non-urgent symptoms. For details visit [email or web address].   Also download the MyChart app! Go to the app store, search "MyChart", open the app, select [facility], and log in with your MyChart username and password.  Masks are optional in the cancer centers. If you would like for your care team to wear a mask while they are taking care of you, please let them know. You may have one support person who is at least 58 years old accompany you for your appointments. 

## 2022-01-09 ENCOUNTER — Other Ambulatory Visit: Payer: Self-pay

## 2022-01-09 LAB — CANCER ANTIGEN 27.29: CA 27.29: 181.5 U/mL — ABNORMAL HIGH (ref 0.0–38.6)

## 2022-01-17 ENCOUNTER — Other Ambulatory Visit: Payer: Self-pay

## 2022-01-21 ENCOUNTER — Other Ambulatory Visit (HOSPITAL_COMMUNITY): Payer: Self-pay

## 2022-01-21 MED FILL — Dexamethasone Sodium Phosphate Inj 100 MG/10ML: INTRAMUSCULAR | Qty: 1 | Status: AC

## 2022-01-21 MED FILL — Fosaprepitant Dimeglumine For IV Infusion 150 MG (Base Eq): INTRAVENOUS | Qty: 5 | Status: AC

## 2022-01-22 ENCOUNTER — Inpatient Hospital Stay: Payer: 59 | Attending: Oncology | Admitting: Adult Health

## 2022-01-22 ENCOUNTER — Inpatient Hospital Stay: Payer: 59

## 2022-01-22 ENCOUNTER — Encounter: Payer: Self-pay | Admitting: Adult Health

## 2022-01-22 ENCOUNTER — Other Ambulatory Visit (HOSPITAL_COMMUNITY): Payer: Self-pay

## 2022-01-22 DIAGNOSIS — Z5111 Encounter for antineoplastic chemotherapy: Secondary | ICD-10-CM | POA: Diagnosis not present

## 2022-01-22 DIAGNOSIS — Z95828 Presence of other vascular implants and grafts: Secondary | ICD-10-CM

## 2022-01-22 DIAGNOSIS — C50111 Malignant neoplasm of central portion of right female breast: Secondary | ICD-10-CM | POA: Diagnosis not present

## 2022-01-22 DIAGNOSIS — C787 Secondary malignant neoplasm of liver and intrahepatic bile duct: Secondary | ICD-10-CM

## 2022-01-22 DIAGNOSIS — Z923 Personal history of irradiation: Secondary | ICD-10-CM | POA: Insufficient documentation

## 2022-01-22 DIAGNOSIS — Z79899 Other long term (current) drug therapy: Secondary | ICD-10-CM | POA: Insufficient documentation

## 2022-01-22 DIAGNOSIS — C7800 Secondary malignant neoplasm of unspecified lung: Secondary | ICD-10-CM | POA: Diagnosis not present

## 2022-01-22 DIAGNOSIS — C50112 Malignant neoplasm of central portion of left female breast: Secondary | ICD-10-CM

## 2022-01-22 DIAGNOSIS — C7951 Secondary malignant neoplasm of bone: Secondary | ICD-10-CM | POA: Diagnosis not present

## 2022-01-22 DIAGNOSIS — Z170 Estrogen receptor positive status [ER+]: Secondary | ICD-10-CM | POA: Insufficient documentation

## 2022-01-22 DIAGNOSIS — Z9013 Acquired absence of bilateral breasts and nipples: Secondary | ICD-10-CM | POA: Insufficient documentation

## 2022-01-22 DIAGNOSIS — C50211 Malignant neoplasm of upper-inner quadrant of right female breast: Secondary | ICD-10-CM | POA: Diagnosis not present

## 2022-01-22 LAB — CBC WITH DIFFERENTIAL (CANCER CENTER ONLY)
Abs Immature Granulocytes: 0.05 10*3/uL (ref 0.00–0.07)
Basophils Absolute: 0 10*3/uL (ref 0.0–0.1)
Basophils Relative: 1 %
Eosinophils Absolute: 0.1 10*3/uL (ref 0.0–0.5)
Eosinophils Relative: 3 %
HCT: 33.6 % — ABNORMAL LOW (ref 36.0–46.0)
Hemoglobin: 10.8 g/dL — ABNORMAL LOW (ref 12.0–15.0)
Immature Granulocytes: 1 %
Lymphocytes Relative: 14 %
Lymphs Abs: 0.6 10*3/uL — ABNORMAL LOW (ref 0.7–4.0)
MCH: 30.6 pg (ref 26.0–34.0)
MCHC: 32.1 g/dL (ref 30.0–36.0)
MCV: 95.2 fL (ref 80.0–100.0)
Monocytes Absolute: 0.5 10*3/uL (ref 0.1–1.0)
Monocytes Relative: 11 %
Neutro Abs: 2.9 10*3/uL (ref 1.7–7.7)
Neutrophils Relative %: 70 %
Platelet Count: 228 10*3/uL (ref 150–400)
RBC: 3.53 MIL/uL — ABNORMAL LOW (ref 3.87–5.11)
RDW: 18.6 % — ABNORMAL HIGH (ref 11.5–15.5)
WBC Count: 4.2 10*3/uL (ref 4.0–10.5)
nRBC: 0.5 % — ABNORMAL HIGH (ref 0.0–0.2)

## 2022-01-22 LAB — CMP (CANCER CENTER ONLY)
ALT: 55 U/L — ABNORMAL HIGH (ref 0–44)
AST: 46 U/L — ABNORMAL HIGH (ref 15–41)
Albumin: 3.6 g/dL (ref 3.5–5.0)
Alkaline Phosphatase: 195 U/L — ABNORMAL HIGH (ref 38–126)
Anion gap: 4 — ABNORMAL LOW (ref 5–15)
BUN: 11 mg/dL (ref 6–20)
CO2: 32 mmol/L (ref 22–32)
Calcium: 9.3 mg/dL (ref 8.9–10.3)
Chloride: 104 mmol/L (ref 98–111)
Creatinine: 0.67 mg/dL (ref 0.44–1.00)
GFR, Estimated: >60 mL/min (ref >60)
Glucose, Bld: 119 mg/dL — ABNORMAL HIGH (ref 70–99)
Potassium: 3.4 mmol/L — ABNORMAL LOW (ref 3.5–5.1)
Sodium: 140 mmol/L (ref 135–145)
Total Bilirubin: 0.5 mg/dL (ref 0.3–1.2)
Total Protein: 6.6 g/dL (ref 6.5–8.1)

## 2022-01-22 LAB — PHOSPHORUS: Phosphorus: 3.4 mg/dL (ref 2.5–4.6)

## 2022-01-22 LAB — MAGNESIUM: Magnesium: 1.9 mg/dL (ref 1.7–2.4)

## 2022-01-22 MED ORDER — SODIUM CHLORIDE 0.9 % IV SOLN
150.0000 mg | Freq: Once | INTRAVENOUS | Status: AC
  Administered 2022-01-22: 150 mg via INTRAVENOUS
  Filled 2022-01-22: qty 150

## 2022-01-22 MED ORDER — SODIUM CHLORIDE 0.9 % IV SOLN: Freq: Once | INTRAVENOUS | Status: AC

## 2022-01-22 MED ORDER — ATROPINE SULFATE 1 MG/ML IV SOLN
0.5000 mg | Freq: Once | INTRAVENOUS | Status: DC | PRN
  Filled 2022-01-22: qty 1

## 2022-01-22 MED ORDER — PALONOSETRON HCL INJECTION 0.25 MG/5ML
0.2500 mg | Freq: Once | INTRAVENOUS | Status: AC
  Administered 2022-01-22: 0.25 mg via INTRAVENOUS
  Filled 2022-01-22: qty 5

## 2022-01-22 MED ORDER — HEPARIN SOD (PORK) LOCK FLUSH 100 UNIT/ML IV SOLN: 500.0000 [IU] | Freq: Once | INTRAVENOUS | Status: DC | PRN

## 2022-01-22 MED ORDER — SODIUM CHLORIDE 0.9 % IV SOLN
10.0000 mg | Freq: Once | INTRAVENOUS | Status: AC
  Administered 2022-01-22: 10 mg via INTRAVENOUS
  Filled 2022-01-22: qty 10

## 2022-01-22 MED ORDER — DIPHENHYDRAMINE HCL 50 MG/ML IJ SOLN
50.0000 mg | Freq: Once | INTRAMUSCULAR | Status: AC
  Administered 2022-01-22: 50 mg via INTRAVENOUS
  Filled 2022-01-22: qty 1

## 2022-01-22 MED ORDER — FAMOTIDINE IN NACL 20-0.9 MG/50ML-% IV SOLN
20.0000 mg | Freq: Once | INTRAVENOUS | Status: AC
  Administered 2022-01-22: 20 mg via INTRAVENOUS
  Filled 2022-01-22: qty 50

## 2022-01-22 MED ORDER — SODIUM CHLORIDE 0.9% FLUSH
10.0000 mL | Freq: Once | INTRAVENOUS | Status: AC
  Administered 2022-01-22: 10 mL

## 2022-01-22 MED ORDER — SODIUM CHLORIDE 0.9 % IV SOLN
6.0000 mg/kg | Freq: Once | INTRAVENOUS | Status: AC
  Administered 2022-01-22: 790 mg via INTRAVENOUS
  Filled 2022-01-22: qty 79

## 2022-01-22 MED ORDER — ACETAMINOPHEN 325 MG PO TABS
650.0000 mg | ORAL_TABLET | Freq: Once | ORAL | Status: AC
  Administered 2022-01-22: 650 mg via ORAL
  Filled 2022-01-22: qty 2

## 2022-01-22 MED ORDER — SODIUM CHLORIDE 0.9% FLUSH: 10.0000 mL | INTRAVENOUS | Status: DC | PRN

## 2022-01-22 NOTE — Assessment & Plan Note (Signed)
Debbie [name] is a 56 year old woman with metastatic breast cancer currently on treatment with [name] given on day 1 and day 8 of a 21-day cycle.  She is tolerating treatment well and will continue this.  Her labs are stable and she has a history of hypokalemia for which she has managed with potassium rich diet and potassium supplementation which I recommended that she continue.  We discussed her fatigue and level of fatigue.  I reviewed the risk-benefit analysis that we should be evaluating with her chemo and if she gets to the point where her fatigue is impacting her activities of daily living and quality of life we can discuss dose reduction of treatment at that time or adjustments.  Right now her treatment is working so I am hesitant to make any changes since she is continuing to work and be able to do all of her normal activities daily.  Her arthralgias are likely related to hormonal changes and degeneration.  She is managing these with Tylenol.  [name] will return in 1 week for day 8 of her treatment and we will see her back in 3 weeks for follow-up prior to her next cycle of therapy.

## 2022-01-22 NOTE — Progress Notes (Signed)
[facility] Follow up:    Debbie Pillar, MD [address] [place] [address]   DIAGNOSIS:  Cancer Staging  Malignant neoplasm of central portion of left breast in female, estrogen receptor positive ([place]) Staging form: Breast, AJCC 7th Edition - Pathologic: Stage IA (T1c, N0, cM0) - Unsigned - Pathologic: Stage IV (M1) - Unsigned  Malignant neoplasm of upper-inner quadrant of right breast in female, estrogen receptor positive ([place]) Staging form: Breast, AJCC 7th Edition - Clinical stage from 10/23/2015: Stage IIA (T2, N0, M0) - Unsigned Staged by: Pathologist and managing physician Laterality: Right Estrogen receptor status: Positive Progesterone receptor status: Positive HER2 status: Negative Stage used in treatment planning: Yes National guidelines used in treatment planning: Yes Type of national guideline used in treatment planning: NCCN - Pathologic: Stage IIB (T2, N1, cM0) - Unsigned - Pathologic: Stage IV (M1) - Unsigned   SUMMARY OF ONCOLOGIC HISTORY: Oncology History  Malignant neoplasm of upper-inner quadrant of right breast in female, estrogen receptor positive ([place])  10/15/2015 Initial Biopsy   Right breast upper inner quadrant biopsy: IDC, DCIS, grade 2, ER+(90%), PR+(50%), Ki67 70%, HER-2 negative (ratio 1.18).   10/17/2015 Initial Diagnosis   Malignant neoplasm of upper-inner quadrant of right breast in female, estrogen receptor positive ([place])   10/2015 -  Anti-estrogen oral therapy   Tamoxifen daily   11/28/2015 Surgery   Left breast mammoplasty: IDC, grade 1, 1.2cm, DCIS, lobular neoplasia, T1c, Nx, ER 80%, PR60%, Ki67 3% HER-2 neg Right breast lumpectomy: IDC, grade 1, 2.3cm, DCIS. T2N1a ER/PR+HER-2 neg, Ki 70%.   11/28/2015 Miscellaneous   Mammaprint was Low Risk. Predicting a 98% chance of no disease recurrence within 5 years with anti-estrogens as the only systemic therapy.  It also predicts minimal to no benefit from  chemotherapy.     12/26/2015 Surgery   Bilateral mastectomies: bilateral sentinel node sampling, Right: no residual carcinoma, left, focal DCIS, 4 SLN negative   02/11/2016 - 03/27/2016 Radiation Therapy   Adjuvant radiation Debbie [name]): 1) Right chest wall: 50.4 Gy in 28 fractions  2) Right chest wall boost: 10 Gy in 5 fractions   08/04/2017 Relapse/Recurrence   08/04/2017, MRI of the liver and total spine MRI 08/11/2017 showed liver and bone metastasis; liver biopsy 09/07/2017 confirms estrogen receptor positive, progesterone receptor negative, HER-2 negative metastatic breast cancer; chest CT scan 08/13/2017 shows multiple bilateral pulmonary nodules.   08/17/2017 - 01/04/2018 Anti-estrogen oral therapy   fulvestrant  palbociclib started 08/31/2017 discontinued 01/04/2018 with evidence of progression in the liver   08/2017 Miscellaneous   [facility] report on liver biopsy from April 2019 shows a negative PDL 1, stable MSI and proficient mismatch repair status.  The tumor is positive for the androgen receptor, and for PTEN for PIK3 CA mutations.  HER-2/neu was read as equivocal (it was negative by FISH on the pathology report).   01/04/2018 Miscellaneous   anastrozole started 01/04/2018 everolimus started 01/29/2018   02/16/2018 - 03/01/2018 Radiation Therapy   01/28/18 prophylactic left femur intramedullary nail surgery for impending left femur pathologic fracture alliative radiation with Dr. Isidore [name] to the left femur and left ilium from 02/16/2018 to 03/01/2018   01/30/2020 - 05/2020 Anti-estrogen oral therapy   alpelisib started 01/30/2020 at 300 mg daily with Metformin 500 mg daily and Faslodex (stopped for progression)   05/22/2020 - 08/2020 Chemotherapy   Xeloda discontinued for progression   09/10/2020 - 06/18/2021 Chemotherapy   Patient is on Treatment Plan : BREAST Paclitaxel D1,D15  q28d     07/17/2021 - 12/18/2021 Chemotherapy   Patient is on Treatment Plan : BREAST METASTATIC Sacituzumab  govitecan-hziy Debbie [name]) q21d     01/01/2022 -  Chemotherapy   Patient is on Treatment Plan : BREAST METASTATIC Sacituzumab govitecan-hziy Debbie [name]) D1,8 q21d     Bilateral malignant neoplasm of breast in female, estrogen receptor positive ([place])  12/26/2015 Initial Diagnosis   Bilateral malignant neoplasm of breast in female, estrogen receptor positive ([place])   07/17/2021 - 12/18/2021 Chemotherapy   Patient is on Treatment Plan : BREAST METASTATIC Sacituzumab govitecan-hziy Debbie [name]) q21d     01/01/2022 -  Chemotherapy   Patient is on Treatment Plan : BREAST METASTATIC Sacituzumab govitecan-hziy Debbie [name]) D1,8 q21d     Malignant neoplasm metastatic to liver ([place])  08/13/2017 Initial Diagnosis   Liver metastases ([place])   07/17/2021 - 12/18/2021 Chemotherapy   Patient is on Treatment Plan : BREAST METASTATIC Sacituzumab govitecan-hziy Debbie [name]) q21d     01/01/2022 -  Chemotherapy   Patient is on Treatment Plan : BREAST METASTATIC Sacituzumab govitecan-hziy Debbie [name]) D1,8 q21d     Malignant neoplasm metastatic to lung ([place])  09/10/2020 Initial Diagnosis   Lung metastases ([place])   07/17/2021 - 12/18/2021 Chemotherapy   Patient is on Treatment Plan : BREAST METASTATIC Sacituzumab govitecan-hziy Debbie [name]) q21d     01/01/2022 -  Chemotherapy   Patient is on Treatment Plan : BREAST METASTATIC Sacituzumab govitecan-hziy Debbie [name]) D1,8 q21d       CURRENT THERAPY: Debbie [name]  INTERVAL HISTORY: Debbie [name] 56 y.o. female returns for follow-up of her metastatic breast cancer currently on treatment with [place].  Her most recent restaging scans were CT chest abdomen pelvis completed on December 31, 2021.  There is no evidence of progression in her metastatic breast cancer.  Debbie [name] continues to do well.  She notes that her main concern is an increase in fatigue and arthralgias.  She denies any significant issues otherwise.  She continues to manage her right arm lymphedema which remains at  baseline and she also has chronic and stable shortness of breath that is unchanged.   Patient Active Problem List   Diagnosis Date Noted   Port-A-Cath in place 08/27/2021   Encounter for antineoplastic chemotherapy 10/08/2020   Lower extremity edema 10/08/2020   Malignant neoplasm metastatic to lung [facility]) 09/10/2020   Goals of care, counseling/discussion 12/27/2018   Osteonecrosis ([place]) 11/01/2018   Pathological fracture in neoplastic disease, left femur, initial encounter for fracture ([place]) 01/28/2018   Morbid obesity with BMI of 40.0-44.9, adult ([place]) 79/89/2119   IUD complication ([place]) 41/74/0814   Bone metastases 08/13/2017   Pain from bone metastases ([place]) 08/13/2017   Malignant neoplasm metastatic to liver ([place]) 08/13/2017   Steatosis of liver 08/13/2017   Bilateral malignant neoplasm of breast in female, estrogen receptor positive ([place]) 12/26/2015   Malignant neoplasm of central portion of left breast in female, estrogen receptor positive ([place]) 11/28/2015   Malignant neoplasm of upper-inner quadrant of right breast in female, estrogen receptor positive ([place]) 10/17/2015   Hypertension 04/19/2012   Hypothyroid 04/19/2012   Fibroids 04/19/2012   Stress incontinence, female 04/19/2012    is allergic to ace inhibitors, atorvastatin, simvastatin, and percocet [oxycodone-acetaminophen].  MEDICAL HISTORY: Past Medical History:  Diagnosis Date   Anxiety    Arthritis    Bone metastasis    Breast cancer of upper-inner quadrant of right female breast [facility]) oncologist-  dr [name]--- 04/ 2019 ER/PR+ Stage IV w/ metastatic disease to liver, total spine, lung,  bone   dx 06/ 2017  right breast invasive ductal carcinoma, Stage IIB, ER/PR + (pT2 pN1), grade 1-- s/p  right breast lumpecotmy w/ sln bx's and bilateral breast reduction 11-28-2015/  left breast with reduction , dx invasive ductal ca, Grade 1 (pT1cpNX) ER/PR positive/  adjuvant radiation completed 03-27-2016 to right chest wall,  2018  reclassifiec Stage IIA   Cancer, metastatic to liver [facility])    Chronic back pain    Constipation    Depression    Family history of adverse reaction to anesthesia     mother has Copd was kept on vent    Headache    Heart murmur    History of hyperthyroidism    s/p  RAI   History of radiation therapy 02/11/16- 03/27/16   Right Chest Wall 50.4 Gy in 28 fractions, Right Chest Wall Boost 10 Gy in 5 fractions.    Hyperlipidemia    Hypertension    Hypothyroidism, postradioiodine therapy    endocrinoloigst-  dr balan--  2004  s/p  RAI   Menorrhagia 2011   Multiple pulmonary nodules    secondary  metastatic   PONV (postoperative nausea and vomiting)    patch helps    SUI (stress urinary incontinence, female)    Wears contact lenses    Wears dentures    full upper and lower partial    SURGICAL HISTORY: Past Surgical History:  Procedure Laterality Date   BREAST LUMPECTOMY WITH NEEDLE LOCALIZATION AND AXILLARY SENTINEL LYMPH NODE BX Right 11/28/2015   Procedure: RIGHT BREAST LUMPECTOMY WITH NEEDLE LOCALIZATION AND AXILLARY SENTINEL LYMPH NODE BX;  Surgeon: Autumn Messing III, MD;  Location: [facility];  Service: General;  Laterality: Right;   BREAST REDUCTION SURGERY Bilateral 11/28/2015   Procedure: MAMMARY REDUCTION  (BREAST) BILATERAL;  Surgeon: Crissie Reese, MD;  Location: [facility];  Service: Plastics;  Laterality: Bilateral;   CESAREAN SECTION  01/12/1993   COLONOSCOPY     Dental surgeries     FEMUR IM NAIL Left 01/28/2018   Procedure: INTRAMEDULLARY (IM) NAIL FEMORAL;  Surgeon: Nicholes Stairs, MD;  Location: [facility];  Service: Orthopedics;  Laterality: Left;   HYSTEROSCOPY N/A 09/02/2017   Procedure: HYSTEROSCOPY  to remove IUD;  Surgeon: Eldred Manges, MD;  Location: [facility];  Service: Gynecology;  Laterality: N/A;   IR IMAGING GUIDED PORT INSERTION  09/16/2020   Liver biospy     MASTECTOMY     MASTECTOMY W/ SENTINEL NODE BIOPSY Bilateral 12/26/2015   Procedure:  BILATERAL MASTECTOMY WITH LEFT SENTINEL LYMPH NODE BIOPSY;  Surgeon: Autumn Messing III, MD;  Location: [facility];  Service: General;  Laterality: Bilateral;   Removal of IUD     TUBAL LIGATION Bilateral 1995    SOCIAL HISTORY: Social History   Socioeconomic History   Marital status: Married    Spouse name: Not on file   Number of children: Not on file   Years of education: Not on file   Highest education level: Not on file  Occupational History   Not on file  Tobacco Use   Smoking status: Never   Smokeless tobacco: Never  Vaping Use   Vaping Use: Never used  Substance and Sexual Activity   Alcohol use: No   Drug use: No   Sexual activity: Not on file    Comment: BTL   Other Topics Concern   Not on file  Social History Narrative   Not on file   Social Determinants of Health  Financial Resource Strain: Not on file  Food Insecurity: Not on file  Transportation Needs: No Transportation Needs (04/01/2018)   PRAPARE - Hydrologist (Medical): No    Lack of Transportation (Non-Medical): No  Physical Activity: Not on file  Stress: Not on file  Social Connections: Not on file  Intimate Partner Violence: Not At Risk (01/18/2018)   Humiliation, Afraid, Rape, and Kick questionnaire    Fear of Current or Ex-Partner: No    Emotionally Abused: No    Physically Abused: No    Sexually Abused: No    FAMILY HISTORY: Family History  Problem Relation Age of Onset   Diabetes Mother    Hypertension Mother    Hypertension Father    Diabetes Maternal Grandmother    Hypertension Sister    Cancer Paternal Grandmother        colon   Colon cancer Paternal Grandmother    Esophageal cancer Neg Hx    Stomach cancer Neg Hx    Rectal cancer Neg Hx     Review of Systems  Constitutional:  Positive for fatigue. Negative for appetite change, chills, fever and unexpected weight change.  HENT:   Negative for hearing loss, lump/mass and trouble swallowing.   Eyes:   Negative for eye problems and icterus.  Respiratory:  Negative for chest tightness, cough and shortness of breath.   Cardiovascular:  Negative for chest pain, leg swelling and palpitations.  Gastrointestinal:  Negative for abdominal distention, abdominal pain, constipation, diarrhea, nausea and vomiting.  Endocrine: Negative for hot flashes.  Genitourinary:  Negative for difficulty urinating.   Musculoskeletal:  Positive for arthralgias.  Skin:  Negative for itching and rash.  Neurological:  Negative for dizziness, extremity weakness, headaches and numbness.  Hematological:  Negative for adenopathy. Does not bruise/bleed easily.  Psychiatric/Behavioral:  Negative for depression. The patient is not nervous/anxious.       PHYSICAL EXAMINATION  ECOG PERFORMANCE STATUS: 1 - Symptomatic but completely ambulatory  Vitals:   01/22/22 0849  BP: (!) 140/66  Pulse: 89  Resp: 16  Temp: 97.9 F (36.6 C)  SpO2: 96%    Physical Exam Constitutional:      General: She is not in acute distress.    Appearance: Normal appearance. She is not toxic-appearing.  HENT:     Head: Normocephalic and atraumatic.  Eyes:     General: No scleral icterus. Cardiovascular:     Rate and Rhythm: Normal rate and regular rhythm.     Pulses: Normal pulses.     Heart sounds: Normal heart sounds.  Pulmonary:     Effort: Pulmonary effort is normal.     Breath sounds: Normal breath sounds.  Abdominal:     General: Abdomen is flat. Bowel sounds are normal. There is no distension.     Palpations: Abdomen is soft.     Tenderness: There is no abdominal tenderness.  Musculoskeletal:        General: Swelling (Right arm sleeve is on) present.     Cervical back: Neck supple.  Lymphadenopathy:     Cervical: No cervical adenopathy.  Skin:    General: Skin is warm and dry.     Findings: No rash.  Neurological:     General: No focal deficit present.     Mental Status: She is alert.  Psychiatric:        Mood and  Affect: Mood normal.        Behavior: Behavior normal.  LABORATORY DATA:  CBC    Component Value Date/Time   WBC 4.2 01/22/2022 0831   WBC 7.2 07/16/2021 1030   RBC 3.53 (L) 01/22/2022 0831   HGB 10.8 (L) 01/22/2022 0831   HGB 13.2 07/08/2016 0948   HCT 33.6 (L) 01/22/2022 0831   HCT 39.9 07/08/2016 0948   PLT 228 01/22/2022 0831   PLT 276 07/08/2016 0948   MCV 95.2 01/22/2022 0831   MCV 88.1 07/08/2016 0948   MCH 30.6 01/22/2022 0831   MCHC 32.1 01/22/2022 0831   RDW 18.6 (H) 01/22/2022 0831   RDW 14.4 07/08/2016 0948   LYMPHSABS 0.6 (L) 01/22/2022 0831   LYMPHSABS 1.1 07/08/2016 0948   MONOABS 0.5 01/22/2022 0831   MONOABS 0.4 07/08/2016 0948   EOSABS 0.1 01/22/2022 0831   EOSABS 0.1 07/08/2016 0948   BASOSABS 0.0 01/22/2022 0831   BASOSABS 0.0 07/08/2016 0948    CMP     Component Value Date/Time   NA 140 01/22/2022 0831   NA 140 07/08/2016 0948   K 3.4 (L) 01/22/2022 0831   K 4.1 07/08/2016 0948   CL 104 01/22/2022 0831   CO2 32 01/22/2022 0831   CO2 24 07/08/2016 0948   GLUCOSE 119 (H) 01/22/2022 0831   GLUCOSE 89 07/08/2016 0948   BUN 11 01/22/2022 0831   BUN 11.9 07/08/2016 0948   CREATININE 0.67 01/22/2022 0831   CREATININE 0.9 07/08/2016 0948   CALCIUM 9.3 01/22/2022 0831   CALCIUM 10.0 07/08/2016 0948   PROT 6.6 01/22/2022 0831   PROT 7.6 07/08/2016 0948   ALBUMIN 3.6 01/22/2022 0831   ALBUMIN 3.6 07/08/2016 0948   AST 46 (H) 01/22/2022 0831   AST 22 07/08/2016 0948   ALT 55 (H) 01/22/2022 0831   ALT 27 07/08/2016 0948   ALKPHOS 195 (H) 01/22/2022 0831   ALKPHOS 115 07/08/2016 0948   BILITOT 0.5 01/22/2022 0831   BILITOT 0.35 07/08/2016 0948   GFRNONAA >60 01/22/2022 0831   GFRAA >60 01/30/2020 0814   GFRAA >60 05/16/2019 0905      ASSESSMENT and THERAPY PLAN:   Malignant neoplasm of upper-inner quadrant of right breast in female, estrogen receptor positive ([place]) [place] is a 56 year old woman with metastatic breast cancer  currently on treatment with Debbie [name] given on day 1 and day 8 of a 21-day cycle.  She is tolerating treatment well and will continue this.  Her labs are stable and she has a history of hypokalemia for which she has managed with potassium rich diet and potassium supplementation which I recommended that she continue.  We discussed her fatigue and level of fatigue.  I reviewed the risk-benefit analysis that we should be evaluating with her chemo and if she gets to the point where her fatigue is impacting her activities of daily living and quality of life we can discuss dose reduction of treatment at that time or adjustments.  Right now her treatment is working so I am hesitant to make any changes since she is continuing to work and be able to do all of her normal activities daily.  Her arthralgias are likely related to hormonal changes and degeneration.  She is managing these with Tylenol.  Raigan will return in 1 week for day 8 of her treatment and we will see her back in 3 weeks for follow-up prior to her next cycle of therapy.    All questions were answered. The patient knows to call the clinic with any problems, questions or concerns. We can certainly  see the patient much sooner if necessary.  Total encounter time:30 minutes*in face-to-face visit time, chart review, lab review, care coordination, order entry, and documentation of the encounter time.    Wilber Bihari, NP 01/22/22 9:37 AM Medical Oncology and Hematology [facility] [place], [place] [address] Tel. [phone]    Fax. [phone]  *Total Encounter Time as defined by the Centers for Medicare and Medicaid Services includes, in addition to the face-to-face time of a patient visit (documented in the note above) non-face-to-face time: obtaining and reviewing outside history, ordering and reviewing medications, tests or procedures, care coordination (communications with other health care professionals  or caregivers) and documentation in the medical record.

## 2022-01-22 NOTE — Patient Instructions (Signed)
[facility] MEDICAL ONCOLOGY  Discharge Instructions: Thank you for choosing [facility] to provide your oncology and hematology care.   If you have a lab appointment with the Cancer Center, please go directly to the Cancer Center and check in at the registration area.   Wear comfortable clothing and clothing appropriate for easy access to any Portacath or PICC line.   We strive to give you quality time with your provider. You may need to reschedule your appointment if you arrive late (15 or more minutes).  Arriving late affects you and other patients whose appointments are after yours.  Also, if you miss three or more appointments without notifying the office, you may be dismissed from the clinic at the provider's discretion.      For prescription refill requests, have your pharmacy contact our office and allow 72 hours for refills to be completed.    Today you received the following chemotherapy and/or immunotherapy agents: trodelvy      To help prevent nausea and vomiting after your treatment, we encourage you to take your nausea medication as directed.  BELOW ARE SYMPTOMS THAT SHOULD BE REPORTED IMMEDIATELY: *FEVER GREATER THAN 100.4 F (38 C) OR HIGHER *CHILLS OR SWEATING *NAUSEA AND VOMITING THAT IS NOT CONTROLLED WITH YOUR NAUSEA MEDICATION *UNUSUAL SHORTNESS OF BREATH *UNUSUAL BRUISING OR BLEEDING *URINARY PROBLEMS (pain or burning when urinating, or frequent urination) *BOWEL PROBLEMS (unusual diarrhea, constipation, pain near the anus) TENDERNESS IN MOUTH AND THROAT WITH OR WITHOUT PRESENCE OF ULCERS (sore throat, sores in mouth, or a toothache) UNUSUAL RASH, SWELLING OR PAIN  UNUSUAL VAGINAL DISCHARGE OR ITCHING   Items with * indicate a potential emergency and should be followed up as soon as possible or go to the Emergency Department if any problems should occur.  Please show the CHEMOTHERAPY ALERT CARD or IMMUNOTHERAPY ALERT CARD at check-in to  the Emergency Department and triage nurse.  Should you have questions after your visit or need to cancel or reschedule your appointment, please contact [facility] MEDICAL ONCOLOGY  Dept: [phone]  and follow the prompts.  Office hours are 8:00 a.m. to 4:30 p.m. Monday - Friday. Please note that voicemails left after 4:00 p.m. may not be returned until the following business day.  We are closed weekends and major holidays. You have access to a nurse at all times for urgent questions. Please call the main number to the clinic Dept: [phone] and follow the prompts.   For any non-urgent questions, you may also contact your provider using MyChart. We now offer e-Visits for anyone 18 and older to request care online for non-urgent symptoms. For details visit [email or web address].   Also download the MyChart app! Go to the app store, search "MyChart", open the app, select [facility], and log in with your MyChart username and password.  Masks are optional in the cancer centers. If you would like for your care team to wear a mask while they are taking care of you, please let them know. You may have one support person who is at least 58 years old accompany you for your appointments. 

## 2022-01-23 LAB — CANCER ANTIGEN 27.29: CA 27.29: 188.2 U/mL — ABNORMAL HIGH (ref 0.0–38.6)

## 2022-01-28 MED FILL — Dexamethasone Sodium Phosphate Inj 100 MG/10ML: INTRAMUSCULAR | Qty: 1 | Status: AC

## 2022-01-28 MED FILL — Fosaprepitant Dimeglumine For IV Infusion 150 MG (Base Eq): INTRAVENOUS | Qty: 5 | Status: AC

## 2022-01-29 ENCOUNTER — Inpatient Hospital Stay: Payer: 59

## 2022-01-29 ENCOUNTER — Other Ambulatory Visit: Payer: Self-pay

## 2022-01-29 ENCOUNTER — Other Ambulatory Visit (HOSPITAL_COMMUNITY): Payer: Self-pay

## 2022-01-29 VITALS — BP 140/70 | HR 82 | Temp 98.0°F | Resp 18 | Wt 293.0 lb

## 2022-01-29 DIAGNOSIS — C787 Secondary malignant neoplasm of liver and intrahepatic bile duct: Secondary | ICD-10-CM

## 2022-01-29 DIAGNOSIS — Z5111 Encounter for antineoplastic chemotherapy: Secondary | ICD-10-CM | POA: Diagnosis not present

## 2022-01-29 DIAGNOSIS — Z79899 Other long term (current) drug therapy: Secondary | ICD-10-CM | POA: Diagnosis not present

## 2022-01-29 DIAGNOSIS — Z170 Estrogen receptor positive status [ER+]: Secondary | ICD-10-CM | POA: Diagnosis not present

## 2022-01-29 DIAGNOSIS — C50112 Malignant neoplasm of central portion of left female breast: Secondary | ICD-10-CM

## 2022-01-29 DIAGNOSIS — C7800 Secondary malignant neoplasm of unspecified lung: Secondary | ICD-10-CM

## 2022-01-29 DIAGNOSIS — Z9013 Acquired absence of bilateral breasts and nipples: Secondary | ICD-10-CM | POA: Diagnosis not present

## 2022-01-29 DIAGNOSIS — C7951 Secondary malignant neoplasm of bone: Secondary | ICD-10-CM | POA: Diagnosis not present

## 2022-01-29 DIAGNOSIS — Z923 Personal history of irradiation: Secondary | ICD-10-CM | POA: Diagnosis not present

## 2022-01-29 DIAGNOSIS — C50211 Malignant neoplasm of upper-inner quadrant of right female breast: Secondary | ICD-10-CM

## 2022-01-29 DIAGNOSIS — Z95828 Presence of other vascular implants and grafts: Secondary | ICD-10-CM

## 2022-01-29 LAB — CMP (CANCER CENTER ONLY)
ALT: 59 U/L — ABNORMAL HIGH (ref 0–44)
AST: 49 U/L — ABNORMAL HIGH (ref 15–41)
Albumin: 3.5 g/dL (ref 3.5–5.0)
Alkaline Phosphatase: 207 U/L — ABNORMAL HIGH (ref 38–126)
Anion gap: 5 (ref 5–15)
BUN: 9 mg/dL (ref 6–20)
CO2: 29 mmol/L (ref 22–32)
Calcium: 8.6 mg/dL — ABNORMAL LOW (ref 8.9–10.3)
Chloride: 104 mmol/L (ref 98–111)
Creatinine: 0.71 mg/dL (ref 0.44–1.00)
GFR, Estimated: >60 mL/min (ref >60)
Glucose, Bld: 130 mg/dL — ABNORMAL HIGH (ref 70–99)
Potassium: 3.8 mmol/L (ref 3.5–5.1)
Sodium: 138 mmol/L (ref 135–145)
Total Bilirubin: 0.3 mg/dL (ref 0.3–1.2)
Total Protein: 6.6 g/dL (ref 6.5–8.1)

## 2022-01-29 LAB — CBC WITH DIFFERENTIAL (CANCER CENTER ONLY)
Abs Immature Granulocytes: 0.05 10*3/uL (ref 0.00–0.07)
Basophils Absolute: 0 10*3/uL (ref 0.0–0.1)
Basophils Relative: 1 %
Eosinophils Absolute: 0.1 10*3/uL (ref 0.0–0.5)
Eosinophils Relative: 3 %
HCT: 32.8 % — ABNORMAL LOW (ref 36.0–46.0)
Hemoglobin: 10.6 g/dL — ABNORMAL LOW (ref 12.0–15.0)
Immature Granulocytes: 2 %
Lymphocytes Relative: 18 %
Lymphs Abs: 0.6 10*3/uL — ABNORMAL LOW (ref 0.7–4.0)
MCH: 30.5 pg (ref 26.0–34.0)
MCHC: 32.3 g/dL (ref 30.0–36.0)
MCV: 94.3 fL (ref 80.0–100.0)
Monocytes Absolute: 0.5 10*3/uL (ref 0.1–1.0)
Monocytes Relative: 14 %
Neutro Abs: 2.1 10*3/uL (ref 1.7–7.7)
Neutrophils Relative %: 62 %
Platelet Count: 266 10*3/uL (ref 150–400)
RBC: 3.48 MIL/uL — ABNORMAL LOW (ref 3.87–5.11)
RDW: 18.5 % — ABNORMAL HIGH (ref 11.5–15.5)
WBC Count: 3.4 10*3/uL — ABNORMAL LOW (ref 4.0–10.5)
nRBC: 0 % (ref 0.0–0.2)

## 2022-01-29 LAB — MAGNESIUM: Magnesium: 1.9 mg/dL (ref 1.7–2.4)

## 2022-01-29 LAB — PHOSPHORUS: Phosphorus: 2.9 mg/dL (ref 2.5–4.6)

## 2022-01-29 MED ORDER — SODIUM CHLORIDE 0.9% FLUSH
10.0000 mL | Freq: Once | INTRAVENOUS | Status: AC
  Administered 2022-01-29: 10 mL

## 2022-01-29 MED ORDER — FAMOTIDINE IN NACL 20-0.9 MG/50ML-% IV SOLN
20.0000 mg | Freq: Once | INTRAVENOUS | Status: AC
  Administered 2022-01-29: 20 mg via INTRAVENOUS
  Filled 2022-01-29: qty 50

## 2022-01-29 MED ORDER — DENOSUMAB 120 MG/1.7ML ~~LOC~~ SOLN
120.0000 mg | Freq: Once | SUBCUTANEOUS | Status: DC
  Administered 2022-01-29: 120 mg via SUBCUTANEOUS

## 2022-01-29 MED ORDER — SODIUM CHLORIDE 0.9% FLUSH
10.0000 mL | INTRAVENOUS | Status: DC | PRN
  Administered 2022-01-29: 10 mL

## 2022-01-29 MED ORDER — SODIUM CHLORIDE 0.9 % IV SOLN: Freq: Once | INTRAVENOUS | Status: AC

## 2022-01-29 MED ORDER — DIPHENHYDRAMINE HCL 50 MG/ML IJ SOLN
50.0000 mg | Freq: Once | INTRAMUSCULAR | Status: AC
  Administered 2022-01-29: 50 mg via INTRAVENOUS
  Filled 2022-01-29: qty 1

## 2022-01-29 MED ORDER — SODIUM CHLORIDE 0.9 % IV SOLN
150.0000 mg | Freq: Once | INTRAVENOUS | Status: AC
  Administered 2022-01-29: 150 mg via INTRAVENOUS
  Filled 2022-01-29: qty 150

## 2022-01-29 MED ORDER — SODIUM CHLORIDE 0.9 % IV SOLN
10.0000 mg | Freq: Once | INTRAVENOUS | Status: AC
  Administered 2022-01-29: 10 mg via INTRAVENOUS
  Filled 2022-01-29: qty 10

## 2022-01-29 MED ORDER — HEPARIN SOD (PORK) LOCK FLUSH 100 UNIT/ML IV SOLN
500.0000 [IU] | Freq: Once | INTRAVENOUS | Status: AC | PRN
  Administered 2022-01-29: 500 [IU]

## 2022-01-29 MED ORDER — ACETAMINOPHEN 325 MG PO TABS
650.0000 mg | ORAL_TABLET | Freq: Once | ORAL | Status: AC
  Administered 2022-01-29: 650 mg via ORAL
  Filled 2022-01-29: qty 2

## 2022-01-29 MED ORDER — SODIUM CHLORIDE 0.9 % IV SOLN
6.0000 mg/kg | Freq: Once | INTRAVENOUS | Status: AC
  Administered 2022-01-29: 790 mg via INTRAVENOUS
  Filled 2022-01-29: qty 79

## 2022-01-29 MED ORDER — PALONOSETRON HCL INJECTION 0.25 MG/5ML
0.2500 mg | Freq: Once | INTRAVENOUS | Status: AC
  Administered 2022-01-29: 0.25 mg via INTRAVENOUS
  Filled 2022-01-29: qty 5

## 2022-01-29 NOTE — Patient Instructions (Signed)
[facility] MEDICAL ONCOLOGY  Discharge Instructions: Thank you for choosing [facility] to provide your oncology and hematology care.   If you have a lab appointment with the Cancer Center, please go directly to the Cancer Center and check in at the registration area.   Wear comfortable clothing and clothing appropriate for easy access to any Portacath or PICC line.   We strive to give you quality time with your provider. You may need to reschedule your appointment if you arrive late (15 or more minutes).  Arriving late affects you and other patients whose appointments are after yours.  Also, if you miss three or more appointments without notifying the office, you may be dismissed from the clinic at the provider's discretion.      For prescription refill requests, have your pharmacy contact our office and allow 72 hours for refills to be completed.    Today you received the following chemotherapy and/or immunotherapy agents: trodelvy      To help prevent nausea and vomiting after your treatment, we encourage you to take your nausea medication as directed.  BELOW ARE SYMPTOMS THAT SHOULD BE REPORTED IMMEDIATELY: *FEVER GREATER THAN 100.4 F (38 C) OR HIGHER *CHILLS OR SWEATING *NAUSEA AND VOMITING THAT IS NOT CONTROLLED WITH YOUR NAUSEA MEDICATION *UNUSUAL SHORTNESS OF BREATH *UNUSUAL BRUISING OR BLEEDING *URINARY PROBLEMS (pain or burning when urinating, or frequent urination) *BOWEL PROBLEMS (unusual diarrhea, constipation, pain near the anus) TENDERNESS IN MOUTH AND THROAT WITH OR WITHOUT PRESENCE OF ULCERS (sore throat, sores in mouth, or a toothache) UNUSUAL RASH, SWELLING OR PAIN  UNUSUAL VAGINAL DISCHARGE OR ITCHING   Items with * indicate a potential emergency and should be followed up as soon as possible or go to the Emergency Department if any problems should occur.  Please show the CHEMOTHERAPY ALERT CARD or IMMUNOTHERAPY ALERT CARD at check-in to  the Emergency Department and triage nurse.  Should you have questions after your visit or need to cancel or reschedule your appointment, please contact [facility] MEDICAL ONCOL[facility]  Dept: [phone]  and follow the prompts.  Office hours are 8:00 a.m. to 4:30 p.m. Monday - Friday. Please note that voicemails left after 4:00 p.m. may not be returned until the following business day.  We are closed weekends and major holidays. You have access to a nurse at all times for urgent questions. Please call the main number to the clinic Dept: [phone] and follow the prompts.   For any non-urgent questions, you may also contact your provider using MyChart. We now offer e-Visits for anyone 18 and older to request care online for non-urgent symptoms. For details visit [email or web address].   Also download the MyChart app! Go to the app store, search "MyChart", open the app, select , and log in with your MyChart username and password.  Masks are optional in the cancer centers. If you would like for your care team to wear a mask while they are taking care of you, please let them know. You may have one support person who is at least 58 years old accompany you for your appointments. 

## 2022-02-06 ENCOUNTER — Telehealth: Payer: Self-pay | Admitting: *Deleted

## 2022-02-06 NOTE — Telephone Encounter (Signed)
Received call from pt with complaint of left sided jaw pain.  Pt states pain is intermittent and chronic from bone shaving several years ago.  Pt requesting advice from MD on how to proceed with tx. Per MD pt needing to take OTC Ibuprofen 600-800 mg q6 hrs and to contact dentist to be seen for further evaluation and tx.  Pt educated and verbalized understanding.

## 2022-02-09 ENCOUNTER — Other Ambulatory Visit: Payer: Self-pay

## 2022-02-11 MED FILL — Dexamethasone Sodium Phosphate Inj 100 MG/10ML: INTRAMUSCULAR | Qty: 1 | Status: AC

## 2022-02-11 MED FILL — Fosaprepitant Dimeglumine For IV Infusion 150 MG (Base Eq): INTRAVENOUS | Qty: 5 | Status: AC

## 2022-02-12 ENCOUNTER — Inpatient Hospital Stay: Payer: 59

## 2022-02-12 ENCOUNTER — Inpatient Hospital Stay: Payer: 59 | Attending: Oncology | Admitting: Adult Health

## 2022-02-12 ENCOUNTER — Other Ambulatory Visit: Payer: Self-pay

## 2022-02-12 ENCOUNTER — Encounter: Payer: Self-pay | Admitting: Adult Health

## 2022-02-12 ENCOUNTER — Other Ambulatory Visit (HOSPITAL_COMMUNITY): Payer: Self-pay

## 2022-02-12 VITALS — BP 173/91 | HR 90 | Temp 97.5°F | Resp 14 | Ht 66.0 in | Wt 298.5 lb

## 2022-02-12 DIAGNOSIS — K1379 Other lesions of oral mucosa: Secondary | ICD-10-CM | POA: Diagnosis not present

## 2022-02-12 DIAGNOSIS — C7800 Secondary malignant neoplasm of unspecified lung: Secondary | ICD-10-CM

## 2022-02-12 DIAGNOSIS — C50112 Malignant neoplasm of central portion of left female breast: Secondary | ICD-10-CM | POA: Diagnosis not present

## 2022-02-12 DIAGNOSIS — C50111 Malignant neoplasm of central portion of right female breast: Secondary | ICD-10-CM | POA: Diagnosis not present

## 2022-02-12 DIAGNOSIS — Z79899 Other long term (current) drug therapy: Secondary | ICD-10-CM | POA: Insufficient documentation

## 2022-02-12 DIAGNOSIS — C787 Secondary malignant neoplasm of liver and intrahepatic bile duct: Secondary | ICD-10-CM

## 2022-02-12 DIAGNOSIS — Z95828 Presence of other vascular implants and grafts: Secondary | ICD-10-CM

## 2022-02-12 DIAGNOSIS — Z9013 Acquired absence of bilateral breasts and nipples: Secondary | ICD-10-CM | POA: Diagnosis not present

## 2022-02-12 DIAGNOSIS — C50211 Malignant neoplasm of upper-inner quadrant of right female breast: Secondary | ICD-10-CM

## 2022-02-12 DIAGNOSIS — Z170 Estrogen receptor positive status [ER+]: Secondary | ICD-10-CM | POA: Insufficient documentation

## 2022-02-12 DIAGNOSIS — C7951 Secondary malignant neoplasm of bone: Secondary | ICD-10-CM | POA: Diagnosis present

## 2022-02-12 DIAGNOSIS — Z5111 Encounter for antineoplastic chemotherapy: Secondary | ICD-10-CM | POA: Diagnosis present

## 2022-02-12 LAB — CBC WITH DIFFERENTIAL (CANCER CENTER ONLY)
Abs Immature Granulocytes: 0.02 10*3/uL (ref 0.00–0.07)
Basophils Absolute: 0 10*3/uL (ref 0.0–0.1)
Basophils Relative: 1 %
Eosinophils Absolute: 0.1 10*3/uL (ref 0.0–0.5)
Eosinophils Relative: 3 %
HCT: 33.5 % — ABNORMAL LOW (ref 36.0–46.0)
Hemoglobin: 11.2 g/dL — ABNORMAL LOW (ref 12.0–15.0)
Immature Granulocytes: 1 %
Lymphocytes Relative: 15 %
Lymphs Abs: 0.6 10*3/uL — ABNORMAL LOW (ref 0.7–4.0)
MCH: 30.9 pg (ref 26.0–34.0)
MCHC: 33.4 g/dL (ref 30.0–36.0)
MCV: 92.3 fL (ref 80.0–100.0)
Monocytes Absolute: 0.4 10*3/uL (ref 0.1–1.0)
Monocytes Relative: 10 %
Neutro Abs: 2.9 10*3/uL (ref 1.7–7.7)
Neutrophils Relative %: 70 %
Platelet Count: 246 10*3/uL (ref 150–400)
RBC: 3.63 MIL/uL — ABNORMAL LOW (ref 3.87–5.11)
RDW: 18 % — ABNORMAL HIGH (ref 11.5–15.5)
WBC Count: 4 10*3/uL (ref 4.0–10.5)
nRBC: 0 % (ref 0.0–0.2)

## 2022-02-12 LAB — CMP (CANCER CENTER ONLY)
ALT: 62 U/L — ABNORMAL HIGH (ref 0–44)
AST: 48 U/L — ABNORMAL HIGH (ref 15–41)
Albumin: 3.6 g/dL (ref 3.5–5.0)
Alkaline Phosphatase: 212 U/L — ABNORMAL HIGH (ref 38–126)
Anion gap: 4 — ABNORMAL LOW (ref 5–15)
BUN: 11 mg/dL (ref 6–20)
CO2: 33 mmol/L — ABNORMAL HIGH (ref 22–32)
Calcium: 8.8 mg/dL — ABNORMAL LOW (ref 8.9–10.3)
Chloride: 103 mmol/L (ref 98–111)
Creatinine: 0.72 mg/dL (ref 0.44–1.00)
GFR, Estimated: >60 mL/min (ref >60)
Glucose, Bld: 136 mg/dL — ABNORMAL HIGH (ref 70–99)
Potassium: 3.3 mmol/L — ABNORMAL LOW (ref 3.5–5.1)
Sodium: 140 mmol/L (ref 135–145)
Total Bilirubin: 0.4 mg/dL (ref 0.3–1.2)
Total Protein: 7.1 g/dL (ref 6.5–8.1)

## 2022-02-12 LAB — PHOSPHORUS: Phosphorus: 3.3 mg/dL (ref 2.5–4.6)

## 2022-02-12 LAB — MAGNESIUM: Magnesium: 1.8 mg/dL (ref 1.7–2.4)

## 2022-02-12 MED ORDER — ACETAMINOPHEN 325 MG PO TABS
650.0000 mg | ORAL_TABLET | Freq: Once | ORAL | Status: AC
  Administered 2022-02-12: 650 mg via ORAL
  Filled 2022-02-12: qty 2

## 2022-02-12 MED ORDER — DIPHENHYDRAMINE HCL 50 MG/ML IJ SOLN
50.0000 mg | Freq: Once | INTRAMUSCULAR | Status: AC
  Administered 2022-02-12: 50 mg via INTRAVENOUS
  Filled 2022-02-12: qty 1

## 2022-02-12 MED ORDER — SODIUM CHLORIDE 0.9% FLUSH
10.0000 mL | INTRAVENOUS | Status: DC | PRN
  Administered 2022-02-12: 10 mL

## 2022-02-12 MED ORDER — SODIUM CHLORIDE 0.9% FLUSH
10.0000 mL | Freq: Once | INTRAVENOUS | Status: AC
  Administered 2022-02-12: 10 mL

## 2022-02-12 MED ORDER — AMOXICILLIN-POT CLAVULANATE 875-125 MG PO TABS
1.0000 | ORAL_TABLET | Freq: Two times a day (BID) | ORAL | 0 refills | Status: DC
  Filled 2022-02-12: qty 20, 10d supply, fill #0

## 2022-02-12 MED ORDER — KETOROLAC TROMETHAMINE 30 MG/ML IJ SOLN
30.0000 mg | Freq: Once | INTRAMUSCULAR | Status: AC
  Administered 2022-02-12: 30 mg via INTRAVENOUS
  Filled 2022-02-12: qty 1

## 2022-02-12 MED ORDER — SODIUM CHLORIDE 0.9 % IV SOLN: Freq: Once | INTRAVENOUS | Status: AC

## 2022-02-12 MED ORDER — SODIUM CHLORIDE 0.9 % IV SOLN
10.0000 mg | Freq: Once | INTRAVENOUS | Status: AC
  Administered 2022-02-12: 10 mg via INTRAVENOUS
  Filled 2022-02-12: qty 10

## 2022-02-12 MED ORDER — SODIUM CHLORIDE 0.9 % IV SOLN
6.0000 mg/kg | Freq: Once | INTRAVENOUS | Status: AC
  Administered 2022-02-12: 790 mg via INTRAVENOUS
  Filled 2022-02-12: qty 79

## 2022-02-12 MED ORDER — SODIUM CHLORIDE 0.9 % IV SOLN
150.0000 mg | Freq: Once | INTRAVENOUS | Status: AC
  Administered 2022-02-12: 150 mg via INTRAVENOUS
  Filled 2022-02-12: qty 150

## 2022-02-12 MED ORDER — FAMOTIDINE IN NACL 20-0.9 MG/50ML-% IV SOLN
20.0000 mg | Freq: Once | INTRAVENOUS | Status: AC
  Administered 2022-02-12: 20 mg via INTRAVENOUS
  Filled 2022-02-12: qty 50

## 2022-02-12 MED ORDER — PALONOSETRON HCL INJECTION 0.25 MG/5ML
0.2500 mg | Freq: Once | INTRAVENOUS | Status: AC
  Administered 2022-02-12: 0.25 mg via INTRAVENOUS
  Filled 2022-02-12: qty 5

## 2022-02-12 MED ORDER — KETOROLAC TROMETHAMINE 30 MG/ML IJ SOLN: 30.0000 mg | Freq: Once | INTRAMUSCULAR | Status: DC

## 2022-02-12 MED ORDER — HEPARIN SOD (PORK) LOCK FLUSH 100 UNIT/ML IV SOLN
500.0000 [IU] | Freq: Once | INTRAVENOUS | Status: AC | PRN
  Administered 2022-02-12: 500 [IU]

## 2022-02-12 NOTE — Patient Instructions (Signed)
[facility] MEDICAL ONCOLOGY  Discharge Instructions: Thank you for choosing [facility] to provide your oncology and hematology care.   If you have a lab appointment with the Cancer Center, please go directly to the Cancer Center and check in at the registration area.   Wear comfortable clothing and clothing appropriate for easy access to any Portacath or PICC line.   We strive to give you quality time with your provider. You may need to reschedule your appointment if you arrive late (15 or more minutes).  Arriving late affects you and other patients whose appointments are after yours.  Also, if you miss three or more appointments without notifying the office, you may be dismissed from the clinic at the provider's discretion.      For prescription refill requests, have your pharmacy contact our office and allow 72 hours for refills to be completed.    Today you received the following chemotherapy and/or immunotherapy agents: Trodelvy      To help prevent nausea and vomiting after your treatment, we encourage you to take your nausea medication as directed.  BELOW ARE SYMPTOMS THAT SHOULD BE REPORTED IMMEDIATELY: *FEVER GREATER THAN 100.4 F (38 C) OR HIGHER *CHILLS OR SWEATING *NAUSEA AND VOMITING THAT IS NOT CONTROLLED WITH YOUR NAUSEA MEDICATION *UNUSUAL SHORTNESS OF BREATH *UNUSUAL BRUISING OR BLEEDING *URINARY PROBLEMS (pain or burning when urinating, or frequent urination) *BOWEL PROBLEMS (unusual diarrhea, constipation, pain near the anus) TENDERNESS IN MOUTH AND THROAT WITH OR WITHOUT PRESENCE OF ULCERS (sore throat, sores in mouth, or a toothache) UNUSUAL RASH, SWELLING OR PAIN  UNUSUAL VAGINAL DISCHARGE OR ITCHING   Items with * indicate a potential emergency and should be followed up as soon as possible or go to the Emergency Department if any problems should occur.  Please show the CHEMOTHERAPY ALERT CARD or IMMUNOTHERAPY ALERT CARD at check-in to  the Emergency Department and triage nurse.  Should you have questions after your visit or need to cancel or reschedule your appointment, please contact [facility] MEDICAL ONCOLOG[facility]  Dept: [phone]  and follow the prompts.  Office hours are 8:00 a.m. to 4:30 p.m. Monday - Friday. Please note that voicemails left after 4:00 p.m. may not be returned until the following business day.  We are closed weekends and major holidays. You have access to a nurse at all times for urgent questions. Please call the main number to the clinic Dept: [phone] and follow the prompts.   For any non-urgent questions, you may also contact your provider using MyChart. We now offer e-Visits for anyone 18 and older to request care online for non-urgent symptoms. For details visit [email or web address].   Also download the MyChart app! Go to the app store, search "MyChart", open the app, select [facility], and log in with your MyChart username and password.  Masks are optional in the cancer centers. If you would like for your care team to wear a mask while they are taking care of you, please let them know. You may have one support person who is at least 58 years old accompany you for your appointments. 

## 2022-02-12 NOTE — Progress Notes (Signed)
Xgeva injection was released under flush appointment and unable to scan during infusion visit. Injection was administered on 01/29/22 in the Left Arm following her infusion. Late entry to correct charting.

## 2022-02-12 NOTE — Assessment & Plan Note (Addendum)
Debbie [name] is a 56 year old woman with metastatic breast cancer currently on treatment with Debbie [name] given on day 1 and day 8 of a 21-day cycle.  Debbie [name] is doing moderately well today with exception of this pain in her mouth.  I prescribed Augmentin for her to take and she will take over-the-counter Advil as much is 800 mg 3 times a day in the short-term until this resolves.  She has no signs of of progression of her breast cancer and feels well enough to proceed with Debbie [name] today which we will do as her labs remain stable and okay for her to receive this treatment.  She knows to call us and let us know if she has any new concerns or issues.  She will return in 1 week for day 8 of her treatment and will see her dentist next week.  We will see her back in 3 weeks for labs, follow-up, and her next treatment.  I anticipate that she will be restaged in November 2023.

## 2022-02-12 NOTE — Progress Notes (Signed)
[facility] Follow up:    Debbie Pillar, MD [address] [place] [address]   DIAGNOSIS:  Cancer Staging  Malignant neoplasm of central portion of left breast in female, estrogen receptor positive ([place]) Staging form: Breast, AJCC 7th Edition - Pathologic: Stage IA (T1c, N0, cM0) - Unsigned - Pathologic: Stage IV (M1) - Unsigned  Malignant neoplasm of upper-inner quadrant of right breast in female, estrogen receptor positive ([place]) Staging form: Breast, AJCC 7th Edition - Clinical stage from 10/23/2015: Stage IIA (T2, N0, M0) - Unsigned Staged by: Pathologist and managing physician Laterality: Right Estrogen receptor status: Positive Progesterone receptor status: Positive HER2 status: Negative Stage used in treatment planning: Yes National guidelines used in treatment planning: Yes Type of national guideline used in treatment planning: NCCN - Pathologic: Stage IIB (T2, N1, cM0) - Unsigned - Pathologic: Stage IV (M1) - Unsigned   SUMMARY OF ONCOLOGIC HISTORY: Oncology History  Malignant neoplasm of upper-inner quadrant of right breast in female, estrogen receptor positive ([place])  10/15/2015 Initial Biopsy   Right breast upper inner quadrant biopsy: IDC, DCIS, grade 2, ER+(90%), PR+(50%), Ki67 70%, HER-2 negative (ratio 1.18).   10/17/2015 Initial Diagnosis   Malignant neoplasm of upper-inner quadrant of right breast in female, estrogen receptor positive ([place])   10/2015 -  Anti-estrogen oral therapy   Tamoxifen daily   11/28/2015 Surgery   Left breast mammoplasty: IDC, grade 1, 1.2cm, DCIS, lobular neoplasia, T1c, Nx, ER 80%, PR60%, Ki67 3% HER-2 neg Right breast lumpectomy: IDC, grade 1, 2.3cm, DCIS. T2N1a ER/PR+HER-2 neg, Ki 70%.   11/28/2015 Miscellaneous   Mammaprint was Low Risk. Predicting a 98% chance of no disease recurrence within 5 years with anti-estrogens as the only systemic therapy.  It also predicts minimal to no benefit from  chemotherapy.     12/26/2015 Surgery   Bilateral mastectomies: bilateral sentinel node sampling, Right: no residual carcinoma, left, focal DCIS, 4 SLN negative   02/11/2016 - 03/27/2016 Radiation Therapy   Adjuvant radiation Debbie [name]): 1) Right chest wall: 50.4 Gy in 28 fractions  2) Right chest wall boost: 10 Gy in 5 fractions   08/04/2017 Relapse/Recurrence   08/04/2017, MRI of the liver and total spine MRI 08/11/2017 showed liver and bone metastasis; liver biopsy 09/07/2017 confirms estrogen receptor positive, progesterone receptor negative, HER-2 negative metastatic breast cancer; chest CT scan 08/13/2017 shows multiple bilateral pulmonary nodules.   08/17/2017 - 01/04/2018 Anti-estrogen oral therapy   fulvestrant  palbociclib started 08/31/2017 discontinued 01/04/2018 with evidence of progression in the liver   08/2017 Miscellaneous   [facility] report on liver biopsy from April 2019 shows a negative PDL 1, stable MSI and proficient mismatch repair status.  The tumor is positive for the androgen receptor, and for PTEN for PIK3 CA mutations.  HER-2/neu was read as equivocal (it was negative by FISH on the pathology report).   01/04/2018 Miscellaneous   anastrozole started 01/04/2018 everolimus started 01/29/2018   02/16/2018 - 03/01/2018 Radiation Therapy   01/28/18 prophylactic left femur intramedullary nail surgery for impending left femur pathologic fracture alliative radiation with Dr. Isidore [name] to the left femur and left ilium from 02/16/2018 to 03/01/2018   01/30/2020 - 05/2020 Anti-estrogen oral therapy   alpelisib started 01/30/2020 at 300 mg daily with Metformin 500 mg daily and Faslodex (stopped for progression)   05/22/2020 - 08/2020 Chemotherapy   Xeloda discontinued for progression   09/10/2020 - 06/18/2021 Chemotherapy   Patient is on Treatment Plan : BREAST Paclitaxel D1,D15  q28d     07/17/2021 - 12/18/2021 Chemotherapy   Patient is on Treatment Plan : BREAST METASTATIC Sacituzumab  govitecan-hziy Debbie [name]) q21d     01/01/2022 -  Chemotherapy   Patient is on Treatment Plan : BREAST METASTATIC Sacituzumab govitecan-hziy Debbie [name]) D1,8 q21d     Bilateral malignant neoplasm of breast in female, estrogen receptor positive ([place])  12/26/2015 Initial Diagnosis   Bilateral malignant neoplasm of breast in female, estrogen receptor positive ([place])   07/17/2021 - 12/18/2021 Chemotherapy   Patient is on Treatment Plan : BREAST METASTATIC Sacituzumab govitecan-hziy Debbie [name]) q21d     01/01/2022 -  Chemotherapy   Patient is on Treatment Plan : BREAST METASTATIC Sacituzumab govitecan-hziy Debbie [name]) D1,8 q21d     Malignant neoplasm metastatic to liver ([place])  08/13/2017 Initial Diagnosis   Liver metastases ([place])   07/17/2021 - 12/18/2021 Chemotherapy   Patient is on Treatment Plan : BREAST METASTATIC Sacituzumab govitecan-hziy Debbie [name]) q21d     01/01/2022 -  Chemotherapy   Patient is on Treatment Plan : BREAST METASTATIC Sacituzumab govitecan-hziy Debbie [name]) D1,8 q21d     Malignant neoplasm metastatic to lung ([place])  09/10/2020 Initial Diagnosis   Lung metastases ([place])   07/17/2021 - 12/18/2021 Chemotherapy   Patient is on Treatment Plan : BREAST METASTATIC Sacituzumab govitecan-hziy Debbie [name]) q21d     01/01/2022 -  Chemotherapy   Patient is on Treatment Plan : BREAST METASTATIC Sacituzumab govitecan-hziy Debbie [name]) D1,8 q21d       CURRENT THERAPY: Debbie [name]  INTERVAL HISTORY: Debbie [name] 56 y.o. female returns for follow-up of her metastatic breast cancer.  Her most recent restaging occurred on December 30, 2021 and showed no progression of her metastatic disease.  She continues to receive Debbie [name] given on days 1 and 8 of a 21-day cycle.  She is tolerating this well.    She is also receiving Xgeva every 12 weeks most recently on January 01, 2022.  Debbie [name] had bone shaved in her mouth a couple years ago and every now and again this left mandible gets irritated and  inflamed.  She typically will take a course of Augmentin and the area will improve.  She has been having increased soreness for the past several days and has been taking ibuprofen with minimal relief.  She does note that her prescription is more than a year old for the ibuprofen.  She is seeing her dentist next week.  Patient Active Problem List   Diagnosis Date Noted   Port-A-Cath in place 08/27/2021   Encounter for antineoplastic chemotherapy 10/08/2020   Lower extremity edema 10/08/2020   Malignant neoplasm metastatic to lung [facility]) 09/10/2020   Goals of care, counseling/discussion 12/27/2018   Osteonecrosis ([place]) 11/01/2018   Pathological fracture in neoplastic disease, left femur, initial encounter for fracture ([place]) 01/28/2018   Morbid obesity with BMI of 40.0-44.9, adult ([place]) 00/37/0488   IUD complication ([place]) 89/16/9450   Bone metastases 08/13/2017   Pain from bone metastases ([place]) 08/13/2017   Malignant neoplasm metastatic to liver ([place]) 08/13/2017   Steatosis of liver 08/13/2017   Bilateral malignant neoplasm of breast in female, estrogen receptor positive ([place]) 12/26/2015   Malignant neoplasm of central portion of left breast in female, estrogen receptor positive ([place]) 11/28/2015   Malignant neoplasm of upper-inner quadrant of right breast in female, estrogen receptor positive ([place]) 10/17/2015   Hypertension 04/19/2012   Hypothyroid 04/19/2012   Fibroids 04/19/2012   Stress incontinence, female 04/19/2012    is allergic to ace inhibitors, atorvastatin, simvastatin, and  percocet [oxycodone-acetaminophen].  MEDICAL HISTORY: Past Medical History:  Diagnosis Date   Anxiety    Arthritis    Bone metastasis    Breast cancer of upper-inner quadrant of right female breast [facility]) oncologist-  dr [name]--- 04/ 2019 ER/PR+ Stage IV w/ metastatic disease to liver, total spine, lung, bone   dx 06/ 2017  right breast invasive ductal carcinoma, Stage IIB, ER/PR + (pT2 pN1), grade 1--  s/p  right breast lumpecotmy w/ sln bx's and bilateral breast reduction 11-28-2015/  left breast with reduction , dx invasive ductal ca, Grade 1 (pT1cpNX) ER/PR positive/  adjuvant radiation completed 03-27-2016 to right chest wall, 2018  reclassifiec Stage IIA   Cancer, metastatic to liver ([place])    Chronic back pain    Constipation    Depression    Family history of adverse reaction to anesthesia     mother has Copd was kept on vent    Headache    Heart murmur    History of hyperthyroidism    s/p  RAI   History of radiation therapy 02/11/16- 03/27/16   Right Chest Wall 50.4 Gy in 28 fractions, Right Chest Wall Boost 10 Gy in 5 fractions.    Hyperlipidemia    Hypertension    Hypothyroidism, postradioiodine therapy    endocrinoloigst-  dr balan--  2004  s/p  RAI   Menorrhagia 2011   Multiple pulmonary nodules    secondary  metastatic   PONV (postoperative nausea and vomiting)    patch helps    SUI (stress urinary incontinence, female)    Wears contact lenses    Wears dentures    full upper and lower partial    SURGICAL HISTORY: Past Surgical History:  Procedure Laterality Date   BREAST LUMPECTOMY WITH NEEDLE LOCALIZATION AND AXILLARY SENTINEL LYMPH NODE BX Right 11/28/2015   Procedure: RIGHT BREAST LUMPECTOMY WITH NEEDLE LOCALIZATION AND AXILLARY SENTINEL LYMPH NODE BX;  Surgeon: Autumn Messing III, MD;  Location: [facility];  Service: General;  Laterality: Right;   BREAST REDUCTION SURGERY Bilateral 11/28/2015   Procedure: MAMMARY REDUCTION  (BREAST) BILATERAL;  Surgeon: Crissie Reese, MD;  Location: [facility];  Service: Plastics;  Laterality: Bilateral;   CESAREAN SECTION  01/12/1993   COLONOSCOPY     Dental surgeries     FEMUR IM NAIL Left 01/28/2018   Procedure: INTRAMEDULLARY (IM) NAIL FEMORAL;  Surgeon: Nicholes Stairs, MD;  Location: [facility];  Service: Orthopedics;  Laterality: Left;   HYSTEROSCOPY N/A 09/02/2017   Procedure: HYSTEROSCOPY  to remove IUD;  Surgeon: Eldred Manges,  MD;  Location: [facility];  Service: Gynecology;  Laterality: N/A;   IR IMAGING GUIDED PORT INSERTION  09/16/2020   Liver biospy     MASTECTOMY     MASTECTOMY W/ SENTINEL NODE BIOPSY Bilateral 12/26/2015   Procedure: BILATERAL MASTECTOMY WITH LEFT SENTINEL LYMPH NODE BIOPSY;  Surgeon: Autumn Messing III, MD;  Location: [facility];  Service: General;  Laterality: Bilateral;   Removal of IUD     TUBAL LIGATION Bilateral 1995    SOCIAL HISTORY: Social History   Socioeconomic History   Marital status: Married    Spouse name: Not on file   Number of children: Not on file   Years of education: Not on file   Highest education level: Not on file  Occupational History   Not on file  Tobacco Use   Smoking status: Never   Smokeless tobacco: Never  Vaping Use   Vaping Use: Never  used  Substance and Sexual Activity   Alcohol use: No   Drug use: No   Sexual activity: Not on file    Comment: BTL   Other Topics Concern   Not on file  Social History Narrative   Not on file   Social Determinants of Health   Financial Resource Strain: Not on file  Food Insecurity: Not on file  Transportation Needs: No Transportation Needs (04/01/2018)   PRAPARE - Hydrologist (Medical): No    Lack of Transportation (Non-Medical): No  Physical Activity: Not on file  Stress: Not on file  Social Connections: Not on file  Intimate Partner Violence: Not At Risk (01/18/2018)   Humiliation, Afraid, Rape, and Kick questionnaire    Fear of Current or Ex-Partner: No    Emotionally Abused: No    Physically Abused: No    Sexually Abused: No    FAMILY HISTORY: Family History  Problem Relation Age of Onset   Diabetes Mother    Hypertension Mother    Hypertension Father    Diabetes Maternal Grandmother    Hypertension Sister    Cancer Paternal Grandmother        colon   Colon cancer Paternal Grandmother    Esophageal cancer Neg Hx    Stomach cancer Neg Hx    Rectal  cancer Neg Hx     Review of Systems  Constitutional:  Negative for appetite change, chills, fatigue, fever and unexpected weight change.  HENT:   Negative for hearing loss, lump/mass and trouble swallowing.   Eyes:  Negative for eye problems and icterus.  Respiratory:  Negative for chest tightness, cough and shortness of breath.   Cardiovascular:  Negative for chest pain, leg swelling and palpitations.  Gastrointestinal:  Negative for abdominal distention, abdominal pain, constipation, diarrhea, nausea and vomiting.  Endocrine: Negative for hot flashes.  Genitourinary:  Negative for difficulty urinating.   Musculoskeletal:  Negative for arthralgias.  Skin:  Negative for itching and rash.  Neurological:  Negative for dizziness, extremity weakness, headaches and numbness.  Hematological:  Negative for adenopathy. Does not bruise/bleed easily.  Psychiatric/Behavioral:  Negative for depression. The patient is not nervous/anxious.       PHYSICAL EXAMINATION  ECOG PERFORMANCE STATUS: 1 - Symptomatic but completely ambulatory  Vitals:   02/12/22 0817  BP: (!) 173/91  Pulse: 90  Resp: 14  Temp: (!) 97.5 F (36.4 C)  SpO2: 95%    Physical Exam Constitutional:      General: She is not in acute distress.    Appearance: Normal appearance. She is not toxic-appearing.  HENT:     Head: Normocephalic and atraumatic.     Comments: Tenderness noted to left lower mandible no swelling noted on the outside or the inside of the jawline. Eyes:     General: No scleral icterus. Cardiovascular:     Rate and Rhythm: Normal rate and regular rhythm.     Pulses: Normal pulses.     Heart sounds: Normal heart sounds.  Pulmonary:     Effort: Pulmonary effort is normal.     Breath sounds: Normal breath sounds.  Abdominal:     General: Abdomen is flat. Bowel sounds are normal. There is no distension.     Palpations: Abdomen is soft.     Tenderness: There is no abdominal tenderness.   Musculoskeletal:        General: No swelling.     Cervical back: Neck supple.  Lymphadenopathy:  Cervical: No cervical adenopathy.  Skin:    General: Skin is warm and dry.     Findings: No rash.  Neurological:     General: No focal deficit present.     Mental Status: She is alert.  Psychiatric:        Mood and Affect: Mood normal.        Behavior: Behavior normal.     LABORATORY DATA:  CBC    Component Value Date/Time   WBC 4.0 02/12/2022 0753   WBC 7.2 07/16/2021 1030   RBC 3.63 (L) 02/12/2022 0753   HGB 11.2 (L) 02/12/2022 0753   HGB 13.2 07/08/2016 0948   HCT 33.5 (L) 02/12/2022 0753   HCT 39.9 07/08/2016 0948   PLT 246 02/12/2022 0753   PLT 276 07/08/2016 0948   MCV 92.3 02/12/2022 0753   MCV 88.1 07/08/2016 0948   MCH 30.9 02/12/2022 0753   MCHC 33.4 02/12/2022 0753   RDW 18.0 (H) 02/12/2022 0753   RDW 14.4 07/08/2016 0948   LYMPHSABS 0.6 (L) 02/12/2022 0753   LYMPHSABS 1.1 07/08/2016 0948   MONOABS 0.4 02/12/2022 0753   MONOABS 0.4 07/08/2016 0948   EOSABS 0.1 02/12/2022 0753   EOSABS 0.1 07/08/2016 0948   BASOSABS 0.0 02/12/2022 0753   BASOSABS 0.0 07/08/2016 0948    CMP     Component Value Date/Time   NA 140 02/12/2022 0753   NA 140 07/08/2016 0948   K 3.3 (L) 02/12/2022 0753   K 4.1 07/08/2016 0948   CL 103 02/12/2022 0753   CO2 33 (H) 02/12/2022 0753   CO2 24 07/08/2016 0948   GLUCOSE 136 (H) 02/12/2022 0753   GLUCOSE 89 07/08/2016 0948   BUN 11 02/12/2022 0753   BUN 11.9 07/08/2016 0948   CREATININE 0.72 02/12/2022 0753   CREATININE 0.9 07/08/2016 0948   CALCIUM 8.8 (L) 02/12/2022 0753   CALCIUM 10.0 07/08/2016 0948   PROT 7.1 02/12/2022 0753   PROT 7.6 07/08/2016 0948   ALBUMIN 3.6 02/12/2022 0753   ALBUMIN 3.6 07/08/2016 0948   AST 48 (H) 02/12/2022 0753   AST 22 07/08/2016 0948   ALT 62 (H) 02/12/2022 0753   ALT 27 07/08/2016 0948   ALKPHOS 212 (H) 02/12/2022 0753   ALKPHOS 115 07/08/2016 0948   BILITOT 0.4 02/12/2022 0753    BILITOT 0.35 07/08/2016 0948   GFRNONAA >60 02/12/2022 0753   GFRAA >60 01/30/2020 0814   GFRAA >60 05/16/2019 0905        ASSESSMENT and THERAPY PLAN:   Malignant neoplasm of upper-inner quadrant of right breast in female, estrogen receptor positive ([place]) Debbie [name] is a 56 year old woman with metastatic breast cancer currently on treatment with Debbie [name] given on day 1 and day 8 of a 21-day cycle.  Mica is doing moderately well today with exception of this pain in her mouth.  I prescribed Augmentin for her to take and she will take over-the-counter Advil as much is 800 mg 3 times a day in the short-term until this resolves.  She has no signs of of progression of her breast cancer and feels well enough to proceed with Debbie [name] today which we will do as her labs remain stable and okay for her to receive this treatment.  She knows to call us and let us know if she has any new concerns or issues.  She will return in 1 week for day 8 of her treatment and will see her dentist next week.  We will see her back  in 3 weeks for labs, follow-up, and her next treatment.  I anticipate that she will be restaged in November 2023.    All questions were answered. The patient knows to call the clinic with any problems, questions or concerns. We can certainly see the patient much sooner if necessary.  Total encounter time:20 minutes*in face-to-face visit time, chart review, lab review, care coordination, order entry, and documentation of the encounter time.    Wilber Bihari, NP 02/12/22 12:14 PM Medical Oncology and Hematology [facility] [place]  [address] Tel. [phone]    Fax. [phone]  *Total Encounter Time as defined by the Centers for Medicare and Medicaid Services includes, in addition to the face-to-face time of a patient visit (documented in the note above) non-face-to-face time: obtaining and reviewing outside history, ordering and reviewing  medications, tests or procedures, care coordination (communications with other health care professionals or caregivers) and documentation in the medical record.

## 2022-02-13 LAB — CANCER ANTIGEN 27.29: CA 27.29: 178.2 U/mL — ABNORMAL HIGH (ref 0.0–38.6)

## 2022-02-17 ENCOUNTER — Other Ambulatory Visit (HOSPITAL_COMMUNITY): Payer: Self-pay

## 2022-02-18 DIAGNOSIS — R7309 Other abnormal glucose: Secondary | ICD-10-CM | POA: Diagnosis not present

## 2022-02-18 DIAGNOSIS — E890 Postprocedural hypothyroidism: Secondary | ICD-10-CM | POA: Diagnosis not present

## 2022-02-18 MED FILL — Dexamethasone Sodium Phosphate Inj 100 MG/10ML: INTRAMUSCULAR | Qty: 1 | Status: AC

## 2022-02-18 MED FILL — Fosaprepitant Dimeglumine For IV Infusion 150 MG (Base Eq): INTRAVENOUS | Qty: 5 | Status: AC

## 2022-02-19 ENCOUNTER — Other Ambulatory Visit: Payer: Self-pay

## 2022-02-19 ENCOUNTER — Inpatient Hospital Stay: Payer: 59

## 2022-02-19 VITALS — BP 135/64 | HR 88 | Temp 98.8°F | Resp 17 | Wt 296.8 lb

## 2022-02-19 DIAGNOSIS — Z79899 Other long term (current) drug therapy: Secondary | ICD-10-CM | POA: Diagnosis not present

## 2022-02-19 DIAGNOSIS — Z170 Estrogen receptor positive status [ER+]: Secondary | ICD-10-CM | POA: Diagnosis not present

## 2022-02-19 DIAGNOSIS — C787 Secondary malignant neoplasm of liver and intrahepatic bile duct: Secondary | ICD-10-CM

## 2022-02-19 DIAGNOSIS — C7800 Secondary malignant neoplasm of unspecified lung: Secondary | ICD-10-CM

## 2022-02-19 DIAGNOSIS — Z9013 Acquired absence of bilateral breasts and nipples: Secondary | ICD-10-CM | POA: Diagnosis not present

## 2022-02-19 DIAGNOSIS — C50211 Malignant neoplasm of upper-inner quadrant of right female breast: Secondary | ICD-10-CM | POA: Diagnosis not present

## 2022-02-19 DIAGNOSIS — C50111 Malignant neoplasm of central portion of right female breast: Secondary | ICD-10-CM

## 2022-02-19 DIAGNOSIS — C7951 Secondary malignant neoplasm of bone: Secondary | ICD-10-CM | POA: Diagnosis not present

## 2022-02-19 DIAGNOSIS — Z5111 Encounter for antineoplastic chemotherapy: Secondary | ICD-10-CM | POA: Diagnosis not present

## 2022-02-19 LAB — CBC WITH DIFFERENTIAL (CANCER CENTER ONLY)
Abs Immature Granulocytes: 0.06 10*3/uL (ref 0.00–0.07)
Basophils Absolute: 0 10*3/uL (ref 0.0–0.1)
Basophils Relative: 1 %
Eosinophils Absolute: 0.1 10*3/uL (ref 0.0–0.5)
Eosinophils Relative: 3 %
HCT: 34 % — ABNORMAL LOW (ref 36.0–46.0)
Hemoglobin: 11.2 g/dL — ABNORMAL LOW (ref 12.0–15.0)
Immature Granulocytes: 2 %
Lymphocytes Relative: 17 %
Lymphs Abs: 0.7 10*3/uL (ref 0.7–4.0)
MCH: 30.7 pg (ref 26.0–34.0)
MCHC: 32.9 g/dL (ref 30.0–36.0)
MCV: 93.2 fL (ref 80.0–100.0)
Monocytes Absolute: 0.4 10*3/uL (ref 0.1–1.0)
Monocytes Relative: 11 %
Neutro Abs: 2.7 10*3/uL (ref 1.7–7.7)
Neutrophils Relative %: 66 %
Platelet Count: 255 10*3/uL (ref 150–400)
RBC: 3.65 MIL/uL — ABNORMAL LOW (ref 3.87–5.11)
RDW: 18.3 % — ABNORMAL HIGH (ref 11.5–15.5)
WBC Count: 4 10*3/uL (ref 4.0–10.5)
nRBC: 0 % (ref 0.0–0.2)

## 2022-02-19 LAB — CMP (CANCER CENTER ONLY)
ALT: 44 U/L (ref 0–44)
AST: 37 U/L (ref 15–41)
Albumin: 3.7 g/dL (ref 3.5–5.0)
Alkaline Phosphatase: 204 U/L — ABNORMAL HIGH (ref 38–126)
Anion gap: 5 (ref 5–15)
BUN: 10 mg/dL (ref 6–20)
CO2: 31 mmol/L (ref 22–32)
Calcium: 9 mg/dL (ref 8.9–10.3)
Chloride: 103 mmol/L (ref 98–111)
Creatinine: 0.69 mg/dL (ref 0.44–1.00)
GFR, Estimated: >60 mL/min (ref >60)
Glucose, Bld: 138 mg/dL — ABNORMAL HIGH (ref 70–99)
Potassium: 3.6 mmol/L (ref 3.5–5.1)
Sodium: 139 mmol/L (ref 135–145)
Total Bilirubin: 0.3 mg/dL (ref 0.3–1.2)
Total Protein: 6.9 g/dL (ref 6.5–8.1)

## 2022-02-19 MED ORDER — PALONOSETRON HCL INJECTION 0.25 MG/5ML
0.2500 mg | Freq: Once | INTRAVENOUS | Status: AC
  Administered 2022-02-19: 0.25 mg via INTRAVENOUS
  Filled 2022-02-19: qty 5

## 2022-02-19 MED ORDER — ACETAMINOPHEN 325 MG PO TABS
650.0000 mg | ORAL_TABLET | Freq: Once | ORAL | Status: AC
  Administered 2022-02-19: 650 mg via ORAL
  Filled 2022-02-19: qty 2

## 2022-02-19 MED ORDER — SODIUM CHLORIDE 0.9 % IV SOLN: Freq: Once | INTRAVENOUS | Status: AC

## 2022-02-19 MED ORDER — SODIUM CHLORIDE 0.9 % IV SOLN
6.0000 mg/kg | Freq: Once | INTRAVENOUS | Status: AC
  Administered 2022-02-19: 790 mg via INTRAVENOUS
  Filled 2022-02-19: qty 79

## 2022-02-19 MED ORDER — DIPHENHYDRAMINE HCL 50 MG/ML IJ SOLN
50.0000 mg | Freq: Once | INTRAMUSCULAR | Status: AC
  Administered 2022-02-19: 50 mg via INTRAVENOUS
  Filled 2022-02-19: qty 1

## 2022-02-19 MED ORDER — HEPARIN SOD (PORK) LOCK FLUSH 100 UNIT/ML IV SOLN
500.0000 [IU] | Freq: Once | INTRAVENOUS | Status: AC | PRN
  Administered 2022-02-19: 500 [IU]

## 2022-02-19 MED ORDER — FAMOTIDINE IN NACL 20-0.9 MG/50ML-% IV SOLN
20.0000 mg | Freq: Once | INTRAVENOUS | Status: AC
  Administered 2022-02-19: 20 mg via INTRAVENOUS
  Filled 2022-02-19: qty 50

## 2022-02-19 MED ORDER — SODIUM CHLORIDE 0.9 % IV SOLN
150.0000 mg | Freq: Once | INTRAVENOUS | Status: AC
  Administered 2022-02-19: 150 mg via INTRAVENOUS
  Filled 2022-02-19: qty 150

## 2022-02-19 MED ORDER — SODIUM CHLORIDE 0.9% FLUSH
10.0000 mL | INTRAVENOUS | Status: DC | PRN
  Administered 2022-02-19: 10 mL

## 2022-02-19 MED ORDER — SODIUM CHLORIDE 0.9 % IV SOLN
10.0000 mg | Freq: Once | INTRAVENOUS | Status: AC
  Administered 2022-02-19: 10 mg via INTRAVENOUS
  Filled 2022-02-19: qty 10

## 2022-02-19 NOTE — Progress Notes (Signed)
Per pt she has a tooth ache in a tooth that she had shaved 2 years ago. Dr Romie Minus her dental surgeon is seeing her for this and she is currently on antibiotics. Dr Lindi Adie aware. No magnesium and phosphorus collected today, Ok to proceed with treatment without these labs and with the tooth issue per Dr Lindi Adie

## 2022-02-19 NOTE — Patient Instructions (Signed)
[facility]ICAL [facility]COLOG[facility]  Discharge Instructions: Thank you for choosing [facility] to provide your oncology and hematology care.   If you have a lab appointment with the Cancer Center, please go directly to the Cancer Center and check in at the registration area.   Wear comfortable clothing and clothing appropriate for easy access to any Portacath or PICC line.   We strive to give you quality time with your provider. You may need to reschedule your appointment if you arrive late (15 or more minutes).  Arriving late affects you and other patients whose appointments are after yours.  Also, if you miss three or more appointments without notifying the office, you may be dismissed from the clinic at the provider's discretion.      For prescription refill requests, have your pharmacy contact our office and allow 72 hours for refills to be completed.    Today you received the following chemotherapy and/or immunotherapy agents: Trodelvy      To help prevent nausea and vomiting after your treatment, we encourage you to take your nausea medication as directed.  BELOW ARE SYMPTOMS THAT SHOULD BE REPORTED IMMEDIATELY: *FEVER GREATER THAN 100.4 F (38 C) OR HIGHER *CHILLS OR SWEATING *NAUSEA AND VOMITING THAT IS NOT CONTROLLED WITH YOUR NAUSEA MEDICATION *UNUSUAL SHORTNESS OF BREATH *UNUSUAL BRUISING OR BLEEDING *URINARY PROBLEMS (pain or burning when urinating, or frequent urination) *BOWEL PROBLEMS (unusual diarrhea, constipation, pain near the anus) TENDERNESS IN MOUTH AND THROAT WITH OR WITHOUT PRESENCE OF ULCERS (sore throat, sores in mouth, or a toothache) UNUSUAL RASH, SWELLING OR PAIN  UNUSUAL VAGINAL DISCHARGE OR ITCHING   Items with * indicate a potential emergency and should be followed up as soon as possible or go to the Emergency Department if any problems should occur.  Please show the CHEMOTHERAPY ALERT CARD or IMMUNOTHERAPY ALERT CARD at check-in to  the Emergency Department and triage nurse.  Should you have questions after your visit or need to cancel or reschedule your appointment, please contact [facility]  Dept: [phone]  and follow the prompts.  Office hours are 8:00 a.m. to 4:30 p.m. Monday - Friday. Please note that voicemails left after 4:00 p.m. may not be returned until the following business day.  We are closed weekends and major holidays. You have access to a nurse at all times for urgent questions. Please call the main number to the clinic Dept: [phone] and follow the prompts.   For any non-urgent questions, you may also contact your provider using MyChart. We now offer e-Visits for anyone 18 and older to request care online for non-urgent symptoms. For details visit [email or web address].   Also download the MyChart app! Go to the app store, search "MyChart", open the app, select [facility], and log in with your MyChart username and password.  Masks are optional in the cancer centers. If you would like for your care team to wear a mask while they are taking care of you, please let them know. You may have one support person who is at least 58 years old accompany you for your appointments. 

## 2022-02-20 LAB — CANCER ANTIGEN 27.29: CA 27.29: 199.5 U/mL — ABNORMAL HIGH (ref 0.0–38.6)

## 2022-02-23 ENCOUNTER — Other Ambulatory Visit (HOSPITAL_COMMUNITY): Payer: Self-pay

## 2022-02-23 ENCOUNTER — Other Ambulatory Visit: Payer: Self-pay | Admitting: Hematology and Oncology

## 2022-02-23 MED ORDER — PREGABALIN 100 MG PO CAPS
100.0000 mg | ORAL_CAPSULE | Freq: Three times a day (TID) | ORAL | 3 refills | Status: DC
  Filled 2022-02-23: qty 90, 30d supply, fill #0
  Filled 2022-03-26: qty 90, 30d supply, fill #1
  Filled 2022-04-21: qty 90, 30d supply, fill #2
  Filled 2022-06-04: qty 90, 30d supply, fill #3

## 2022-02-26 DIAGNOSIS — I10 Essential (primary) hypertension: Secondary | ICD-10-CM | POA: Diagnosis not present

## 2022-02-26 DIAGNOSIS — E890 Postprocedural hypothyroidism: Secondary | ICD-10-CM | POA: Diagnosis not present

## 2022-02-26 DIAGNOSIS — G5793 Unspecified mononeuropathy of bilateral lower limbs: Secondary | ICD-10-CM | POA: Diagnosis not present

## 2022-02-26 DIAGNOSIS — E1165 Type 2 diabetes mellitus with hyperglycemia: Secondary | ICD-10-CM | POA: Diagnosis not present

## 2022-02-27 ENCOUNTER — Other Ambulatory Visit: Payer: Self-pay

## 2022-02-27 ENCOUNTER — Other Ambulatory Visit (HOSPITAL_COMMUNITY): Payer: Self-pay

## 2022-02-28 ENCOUNTER — Other Ambulatory Visit (HOSPITAL_COMMUNITY): Payer: Self-pay

## 2022-02-28 MED ORDER — FREESTYLE LITE TEST VI STRP
1.0000 | ORAL_STRIP | Freq: Three times a day (TID) | 3 refills | Status: DC
  Filled 2022-02-28: qty 100, 33d supply, fill #0
  Filled 2022-04-21: qty 100, 33d supply, fill #1

## 2022-03-02 ENCOUNTER — Other Ambulatory Visit (HOSPITAL_COMMUNITY): Payer: Self-pay

## 2022-03-03 ENCOUNTER — Other Ambulatory Visit (HOSPITAL_COMMUNITY): Payer: Self-pay

## 2022-03-03 NOTE — Progress Notes (Unsigned)
Patient Care Team: Kelton Pillar, MD as PCP - General (Family Medicine) Jacelyn Pi, MD as Consulting Physician (Endocrinology) Jovita Kussmaul, MD as Consulting Physician (General Surgery) [name], MD as Attending Physician (Radiation Oncology) Belva Crome, MD as Consulting Physician (Cardiology) [name] [name], NP as Nurse Practitioner (Hematology and Oncology) Lajoyce Lauber, DMD (Dentistry) Raina Mina, RPH-CPP (Pharmacist) Nicholas Lose, MD as Consulting Physician (Hematology and Oncology)  DIAGNOSIS: No diagnosis found.  SUMMARY OF ONCOLOGIC HISTORY: Oncology History  Malignant neoplasm of upper-inner quadrant of right breast in female, estrogen receptor positive ([place])  10/15/2015 Initial Biopsy   Right breast upper inner quadrant biopsy: IDC, DCIS, grade 2, ER+(90%), PR+(50%), Ki67 70%, HER-2 negative (ratio 1.18).   10/17/2015 Initial Diagnosis   Malignant neoplasm of upper-inner quadrant of right breast in female, estrogen receptor positive ([place])   10/2015 -  Anti-estrogen oral therapy   Tamoxifen daily   11/28/2015 Surgery   Left breast mammoplasty: IDC, grade 1, 1.2cm, DCIS, lobular neoplasia, T1c, Nx, ER 80%, PR60%, Ki67 3% HER-2 neg Right breast lumpectomy: IDC, grade 1, 2.3cm, DCIS. T2N1a ER/PR+HER-2 neg, Ki 70%.   11/28/2015 Miscellaneous   Mammaprint was Low Risk. Predicting a 98% chance of no disease recurrence within 5 years with anti-estrogens as the only systemic therapy.  It also predicts minimal to no benefit from chemotherapy.     12/26/2015 Surgery   Bilateral mastectomies: bilateral sentinel node sampling, Right: no residual carcinoma, left, focal DCIS, 4 SLN negative   02/11/2016 - 03/27/2016 Radiation Therapy   Adjuvant radiation [name]): 1) Right chest wall: 50.4 Gy in 28 fractions  2) Right chest wall boost: 10 Gy in 5 fractions   08/04/2017 Relapse/Recurrence   08/04/2017, MRI of the liver and total spine MRI 08/11/2017  showed liver and bone metastasis; liver biopsy 09/07/2017 confirms estrogen receptor positive, progesterone receptor negative, HER-2 negative metastatic breast cancer; chest CT scan 08/13/2017 shows multiple bilateral pulmonary nodules.   08/17/2017 - 01/04/2018 Anti-estrogen oral therapy   fulvestrant  palbociclib started 08/31/2017 discontinued 01/04/2018 with evidence of progression in the liver   08/2017 Miscellaneous   [facility] report on liver biopsy from April 2019 shows a negative PDL 1, stable MSI and proficient mismatch repair status.  The tumor is positive for the androgen receptor, and for PTEN for PIK3 CA mutations.  HER-2/neu was read as equivocal (it was negative by FISH on the pathology report).   01/04/2018 Miscellaneous   anastrozole started 01/04/2018 everolimus started 01/29/2018   02/16/2018 - 03/01/2018 Radiation Therapy   01/28/18 prophylactic left femur intramedullary nail surgery for impending left femur pathologic fracture alliative radiation with Dr. [name] to the left femur and left ilium from 02/16/2018 to 03/01/2018   01/30/2020 - 05/2020 Anti-estrogen oral therapy   alpelisib started 01/30/2020 at 300 mg daily with Metformin 500 mg daily and Faslodex (stopped for progression)   05/22/2020 - 08/2020 Chemotherapy   Xeloda discontinued for progression   09/10/2020 - 06/18/2021 Chemotherapy   Patient is on Treatment Plan : BREAST Paclitaxel D1,D15 q28d     07/17/2021 - 12/18/2021 Chemotherapy   Patient is on Treatment Plan : BREAST METASTATIC Sacituzumab govitecan-hziy [name]) q21d     01/01/2022 -  Chemotherapy   Patient is on Treatment Plan : BREAST METASTATIC Sacituzumab govitecan-hziy [name]) D1,8 q21d     Bilateral malignant neoplasm of breast in female, estrogen receptor positive ([place])  12/26/2015 Initial Diagnosis   Bilateral malignant neoplasm of breast in female, estrogen receptor positive (  [place])   07/17/2021 - 12/18/2021 Chemotherapy   Patient is on Treatment Plan  : BREAST METASTATIC Sacituzumab govitecan-hziy [name]) q21d     01/01/2022 -  Chemotherapy   Patient is on Treatment Plan : BREAST METASTATIC Sacituzumab govitecan-hziy [name]) D1,8 q21d     Malignant neoplasm metastatic to liver ([place])  08/13/2017 Initial Diagnosis   Liver metastases ([employer])   07/17/2021 - 12/18/2021 Chemotherapy   Patient is on Treatment Plan : BREAST METASTATIC Sacituzumab govitecan-hziy [name]) q21d     01/01/2022 -  Chemotherapy   Patient is on Treatment Plan : BREAST METASTATIC Sacituzumab govitecan-hziy [name]) D1,8 q21d     Malignant neoplasm metastatic to lung ([place])  09/10/2020 Initial Diagnosis   Lung metastases ([place])   07/17/2021 - 12/18/2021 Chemotherapy   Patient is on Treatment Plan : BREAST METASTATIC Sacituzumab govitecan-hziy [name]) q21d     01/01/2022 -  Chemotherapy   Patient is on Treatment Plan : BREAST METASTATIC Sacituzumab govitecan-hziy [name]) D1,8 q21d       CHIEF COMPLIANT: Follow-up metastatic breast cancer  INTERVAL HISTORY: Debbie [name] is a 56 year old with above-mentioned history of metastatic breast cancer. She presents to the clinic for a follow-up.   ALLERGIES:  is allergic to ace inhibitors, atorvastatin, simvastatin, and percocet [oxycodone-acetaminophen].  MEDICATIONS:  Current Outpatient Medications  Medication Sig Dispense Refill   acetaminophen (TYLENOL) 500 MG tablet Take 1,000 mg by mouth every 6 (six) hours as needed.     amoxicillin-clavulanate (AUGMENTIN) 875-125 MG tablet Take 1 tablet by mouth 2 (two) times daily. 20 tablet 0   Calcium-Magnesium 250-125 MG TABS Take 1 tablet by mouth daily. 120 each 4   cetirizine (ZYRTEC) 10 MG tablet Take 10 mg by mouth daily as needed for allergies.      chlorhexidine (PERIDEX) 0.12 % solution Use 5 mLs in the mouth or throat 2 (two) times daily as needed as directed. ****Do not swallow**** 473 mL 6   diphenoxylate-atropine (LOMOTIL) 2.5-0.025 MG tablet Take 1  tablet by mouth 4 (four) times daily as needed for diarrhea or loose stools. 30 tablet 2   fluconazole (DIFLUCAN) 100 MG tablet Take 1 tablet (100 mg total) by mouth daily. 7 tablet 0   furosemide (LASIX) 20 MG tablet Take 1 tablet (20 mg total) by mouth daily. 90 tablet 6   glucose blood (FREESTYLE LITE) test strip Use 1 strip to check blood sugar 3 (three) times daily. 50 strip 3   levothyroxine (SYNTHROID) 175 MCG tablet Take 1 tablet (175 mcg total) by mouth in the morning on an empty stomach (Patient taking differently: Pt taking 6 days in a row, skip 7th day.) 30 tablet 6   losartan-hydrochlorothiazide (HYZAAR) 100-25 MG tablet Take 1 tablet by mouth once a day 90 tablet 0   metFORMIN (GLUCOPHAGE-XR) 500 MG 24 hr tablet Take 1 tablet by mouth  with evening meal 90 tablet 1   Multiple Vitamin (MULTIVITAMIN) tablet Take 1 tablet by mouth daily.     omega-3 acid ethyl esters (LOVAZA) 1 g capsule Take 1 g by mouth 2 (two) times daily.      omeprazole (PRILOSEC) 40 MG capsule Take 1 capsule (40 mg total) by mouth daily. 30 capsule 3   ondansetron (ZOFRAN-ODT) 8 MG disintegrating tablet Dissolve 1 tablet (8 mg total) by mouth every 8 (eight) hours as needed for nausea or vomiting. 20 tablet 0   oxyCODONE (OXY IR/ROXICODONE) 5 MG immediate release tablet Take 1 tablet (5 mg total) by mouth every  4 (four) hours as needed for severe pain. 60 tablet 0   potassium chloride 20 MEQ/15ML (10%) SOLN Take 30 mLs by mouth daily. 473 mL 0   pregabalin (LYRICA) 100 MG capsule Take 1 capsule by mouth 3 times daily. 90 capsule 3   rosuvastatin (CRESTOR) 5 MG tablet Take 1 tablet (5 mg total) by mouth daily. 90 tablet 1   sertraline (ZOLOFT) 100 MG tablet TAKE 1 TABLET BY MOUTH ONCE DAILY 30 tablet 12   valACYclovir (VALTREX) 1000 MG tablet Take 1 tablet (1,000 mg total) by mouth daily. 60 tablet 6   Vitamin D, Cholecalciferol, 1000 units CAPS Take 1 tablet by mouth daily. (Patient taking differently: Take 1,000  Units by mouth daily.) 90 capsule 4   No current facility-administered medications for this visit.    PHYSICAL EXAMINATION: ECOG PERFORMANCE STATUS: {CHL ONC ECOG PS:[phone]}  There were no vitals filed for this visit. There were no vitals filed for this visit.  BREAST:*** No palpable masses or nodules in either right or left breasts. No palpable axillary supraclavicular or infraclavicular adenopathy no breast tenderness or nipple discharge. (exam performed in the presence of a chaperone)  LABORATORY DATA:  I have reviewed the data as listed    Latest Ref Rng & Units 02/19/2022    7:46 AM 02/12/2022    7:53 AM 01/29/2022    7:28 AM  CMP  Glucose 70 - 99 mg/dL 138  136  130   BUN 6 - 20 mg/dL _0 Creatinine 0.44 - 1.00 mg/dL 0.69  0.72  0.71   Sodium 135 - 145 mmol/L 139  140  138   Potassium 3.5 - 5.1 mmol/L 3.6  3.3  3.8   Chloride 98 - 111 mmol/L 103  103  104   CO2 22 - 32 mmol/L 31  33  29   Calcium 8.9 - 10.3 mg/dL 9.0  8.8  8.6   Total Protein 6.5 - 8.1 g/dL 6.9  7.1  6.6   Total Bilirubin 0.3 - 1.2 mg/dL 0.3  0.4  0.3   Alkaline Phos 38 - 126 U/L 204  212  207   AST 15 - 41 U/L 37  48  49   ALT 0 - 44 U/L 44  62  59     Lab Results  Component Value Date   WBC 4.0 02/19/2022   HGB 11.2 (L) 02/19/2022   HCT 34.0 (L) 02/19/2022   MCV 93.2 02/19/2022   PLT 255 02/19/2022   NEUTROABS 2.7 02/19/2022    ASSESSMENT & PLAN:  No problem-specific Assessment & Plan notes found for this encounter.    No orders of the defined types were placed in this encounter.  The patient has a good understanding of the overall plan. she agrees with it. she will call with any problems that may develop before the next visit here. Total time spent: 30 mins including face to face time and time spent for planning, charting and co-ordination of care   Suzzette Righter, [place] 03/03/22

## 2022-03-04 ENCOUNTER — Other Ambulatory Visit: Payer: Self-pay

## 2022-03-04 MED FILL — Fosaprepitant Dimeglumine For IV Infusion 150 MG (Base Eq): INTRAVENOUS | Qty: 5 | Status: AC

## 2022-03-04 MED FILL — Dexamethasone Sodium Phosphate Inj 100 MG/10ML: INTRAMUSCULAR | Qty: 1 | Status: AC

## 2022-03-05 ENCOUNTER — Inpatient Hospital Stay (HOSPITAL_BASED_OUTPATIENT_CLINIC_OR_DEPARTMENT_OTHER): Payer: 59 | Admitting: Hematology and Oncology

## 2022-03-05 ENCOUNTER — Inpatient Hospital Stay: Payer: 59

## 2022-03-05 ENCOUNTER — Other Ambulatory Visit: Payer: Self-pay

## 2022-03-05 ENCOUNTER — Other Ambulatory Visit (HOSPITAL_COMMUNITY): Payer: Self-pay

## 2022-03-05 VITALS — HR 83

## 2022-03-05 DIAGNOSIS — Z170 Estrogen receptor positive status [ER+]: Secondary | ICD-10-CM | POA: Diagnosis not present

## 2022-03-05 DIAGNOSIS — C787 Secondary malignant neoplasm of liver and intrahepatic bile duct: Secondary | ICD-10-CM

## 2022-03-05 DIAGNOSIS — C50211 Malignant neoplasm of upper-inner quadrant of right female breast: Secondary | ICD-10-CM

## 2022-03-05 DIAGNOSIS — C50112 Malignant neoplasm of central portion of left female breast: Secondary | ICD-10-CM

## 2022-03-05 DIAGNOSIS — Z95828 Presence of other vascular implants and grafts: Secondary | ICD-10-CM

## 2022-03-05 DIAGNOSIS — C7800 Secondary malignant neoplasm of unspecified lung: Secondary | ICD-10-CM

## 2022-03-05 DIAGNOSIS — C50111 Malignant neoplasm of central portion of right female breast: Secondary | ICD-10-CM

## 2022-03-05 DIAGNOSIS — Z9013 Acquired absence of bilateral breasts and nipples: Secondary | ICD-10-CM | POA: Diagnosis not present

## 2022-03-05 DIAGNOSIS — Z5111 Encounter for antineoplastic chemotherapy: Secondary | ICD-10-CM | POA: Diagnosis not present

## 2022-03-05 DIAGNOSIS — C7951 Secondary malignant neoplasm of bone: Secondary | ICD-10-CM | POA: Diagnosis not present

## 2022-03-05 DIAGNOSIS — Z79899 Other long term (current) drug therapy: Secondary | ICD-10-CM | POA: Diagnosis not present

## 2022-03-05 LAB — CMP (CANCER CENTER ONLY)
ALT: 45 U/L — ABNORMAL HIGH (ref 0–44)
AST: 36 U/L (ref 15–41)
Albumin: 3.6 g/dL (ref 3.5–5.0)
Alkaline Phosphatase: 215 U/L — ABNORMAL HIGH (ref 38–126)
Anion gap: 8 (ref 5–15)
BUN: 11 mg/dL (ref 6–20)
CO2: 31 mmol/L (ref 22–32)
Calcium: 9.2 mg/dL (ref 8.9–10.3)
Chloride: 102 mmol/L (ref 98–111)
Creatinine: 0.76 mg/dL (ref 0.44–1.00)
GFR, Estimated: >60 mL/min (ref >60)
Glucose, Bld: 123 mg/dL — ABNORMAL HIGH (ref 70–99)
Potassium: 3.2 mmol/L — ABNORMAL LOW (ref 3.5–5.1)
Sodium: 141 mmol/L (ref 135–145)
Total Bilirubin: 0.3 mg/dL (ref 0.3–1.2)
Total Protein: 6.9 g/dL (ref 6.5–8.1)

## 2022-03-05 LAB — CBC WITH DIFFERENTIAL (CANCER CENTER ONLY)
Abs Immature Granulocytes: 0.03 10*3/uL (ref 0.00–0.07)
Basophils Absolute: 0 10*3/uL (ref 0.0–0.1)
Basophils Relative: 0 %
Eosinophils Absolute: 0.1 10*3/uL (ref 0.0–0.5)
Eosinophils Relative: 3 %
HCT: 33.9 % — ABNORMAL LOW (ref 36.0–46.0)
Hemoglobin: 11 g/dL — ABNORMAL LOW (ref 12.0–15.0)
Immature Granulocytes: 1 %
Lymphocytes Relative: 17 %
Lymphs Abs: 0.8 10*3/uL (ref 0.7–4.0)
MCH: 30.1 pg (ref 26.0–34.0)
MCHC: 32.4 g/dL (ref 30.0–36.0)
MCV: 92.6 fL (ref 80.0–100.0)
Monocytes Absolute: 0.5 10*3/uL (ref 0.1–1.0)
Monocytes Relative: 10 %
Neutro Abs: 3.2 10*3/uL (ref 1.7–7.7)
Neutrophils Relative %: 69 %
Platelet Count: 262 10*3/uL (ref 150–400)
RBC: 3.66 MIL/uL — ABNORMAL LOW (ref 3.87–5.11)
RDW: 18.1 % — ABNORMAL HIGH (ref 11.5–15.5)
WBC Count: 4.6 10*3/uL (ref 4.0–10.5)
nRBC: 0 % (ref 0.0–0.2)

## 2022-03-05 LAB — MAGNESIUM: Magnesium: 1.8 mg/dL (ref 1.7–2.4)

## 2022-03-05 LAB — PHOSPHORUS: Phosphorus: 3.6 mg/dL (ref 2.5–4.6)

## 2022-03-05 MED ORDER — SODIUM CHLORIDE 0.9% FLUSH
10.0000 mL | INTRAVENOUS | Status: DC | PRN
  Administered 2022-03-05: 10 mL

## 2022-03-05 MED ORDER — FAMOTIDINE IN NACL 20-0.9 MG/50ML-% IV SOLN
20.0000 mg | Freq: Once | INTRAVENOUS | Status: AC
  Administered 2022-03-05: 20 mg via INTRAVENOUS
  Filled 2022-03-05: qty 50

## 2022-03-05 MED ORDER — DIPHENHYDRAMINE HCL 50 MG/ML IJ SOLN
50.0000 mg | Freq: Once | INTRAMUSCULAR | Status: AC
  Administered 2022-03-05: 50 mg via INTRAVENOUS
  Filled 2022-03-05: qty 1

## 2022-03-05 MED ORDER — SODIUM CHLORIDE 0.9% FLUSH
10.0000 mL | Freq: Once | INTRAVENOUS | Status: AC
  Administered 2022-03-05: 10 mL

## 2022-03-05 MED ORDER — SODIUM CHLORIDE 0.9 % IV SOLN: Freq: Once | INTRAVENOUS | Status: AC

## 2022-03-05 MED ORDER — HEPARIN SOD (PORK) LOCK FLUSH 100 UNIT/ML IV SOLN
500.0000 [IU] | Freq: Once | INTRAVENOUS | Status: AC | PRN
  Administered 2022-03-05: 500 [IU]

## 2022-03-05 MED ORDER — SODIUM CHLORIDE 0.9 % IV SOLN
10.0000 mg | Freq: Once | INTRAVENOUS | Status: AC
  Administered 2022-03-05: 10 mg via INTRAVENOUS
  Filled 2022-03-05: qty 10

## 2022-03-05 MED ORDER — SODIUM CHLORIDE 0.9 % IV SOLN
150.0000 mg | Freq: Once | INTRAVENOUS | Status: AC
  Administered 2022-03-05: 150 mg via INTRAVENOUS
  Filled 2022-03-05: qty 150

## 2022-03-05 MED ORDER — ACETAMINOPHEN 325 MG PO TABS
650.0000 mg | ORAL_TABLET | Freq: Once | ORAL | Status: AC
  Administered 2022-03-05: 650 mg via ORAL
  Filled 2022-03-05: qty 2

## 2022-03-05 MED ORDER — SODIUM CHLORIDE 0.9 % IV SOLN
6.0000 mg/kg | Freq: Once | INTRAVENOUS | Status: AC
  Administered 2022-03-05: 790 mg via INTRAVENOUS
  Filled 2022-03-05: qty 79

## 2022-03-05 MED ORDER — PALONOSETRON HCL INJECTION 0.25 MG/5ML
0.2500 mg | Freq: Once | INTRAVENOUS | Status: AC
  Administered 2022-03-05: 0.25 mg via INTRAVENOUS
  Filled 2022-03-05: qty 5

## 2022-03-05 NOTE — Progress Notes (Addendum)
Pt continues with tooth/jaw issues today, MD aware. Pt seeing dentist about this issue. Holding [place], ok per Dr Lindi Adie

## 2022-03-05 NOTE — Patient Instructions (Signed)
[facility] ONCOLOGY  Discharge Instructions: Thank you for choosing [facility] to provide your oncology and hematology care.   If you have a lab appointment with the [facility], please go directly to the [facility] and check in at the registration area.   Wear comfortable clothing and clothing appropriate for easy access to any Portacath or PICC line.   We strive to give you quality time with your provider. You may need to reschedule your appointment if you arrive late (15 or more minutes).  Arriving late affects you and other patients whose appointments are after yours.  Also, if you miss three or more appointments without notifying the office, you may be dismissed from the clinic at the provider's discretion.      For prescription refill requests, have your pharmacy contact our office and allow 72 hours for refills to be completed.    Today you received the following chemotherapy and/or immunotherapy agents: [name]      To help prevent nausea and vomiting after your treatment, we encourage you to take your nausea medication as directed.  BELOW ARE SYMPTOMS THAT SHOULD BE REPORTED IMMEDIATELY: *FEVER GREATER THAN 100.4 F (38 C) OR HIGHER *CHILLS OR SWEATING *NAUSEA AND VOMITING THAT IS NOT CONTROLLED WITH YOUR NAUSEA MEDICATION *UNUSUAL SHORTNESS OF BREATH *UNUSUAL BRUISING OR BLEEDING *URINARY PROBLEMS (pain or burning when urinating, or frequent urination) *BOWEL PROBLEMS (unusual diarrhea, constipation, pain near the anus) TENDERNESS IN MOUTH AND THROAT WITH OR WITHOUT PRESENCE OF ULCERS (sore throat, sores in mouth, or a toothache) UNUSUAL RASH, SWELLING OR PAIN  UNUSUAL VAGINAL DISCHARGE OR ITCHING   Items with * indicate a potential emergency and should be followed up as soon as possible or go to the Emergency Department if any problems should occur.  Please show the CHEMOTHERAPY ALERT CARD or IMMUNOTHERAPY ALERT CARD at check-in to  the Emergency Department and triage nurse.  Should you have questions after your visit or need to cancel or reschedule your appointment, please contact [facility]  Dept: [phone]  and follow the prompts.  Office hours are 8:00 a.m. to 4:30 p.m. Monday - Friday. Please note that voicemails left after 4:00 p.m. may not be returned until the following business day.  We are closed weekends and major holidays. You have access to a nurse at all times for urgent questions. Please call the main number to the clinic Dept: [phone] and follow the prompts.   For any non-urgent questions, you may also contact your provider using MyChart. We now offer e-Visits for anyone 47 and older to request care online for non-urgent symptoms. For details visit [email or web address].   Also download the MyChart app! Go to the app store, search "MyChart", open the app, select [facility], and log in with your MyChart username and password.  Masks are optional in the cancer centers. If you would like for your care team to wear a mask while they are taking care of you, please let them know. You may have one support person who is at least 58 years old accompany you for your appointments.

## 2022-03-05 NOTE — Assessment & Plan Note (Addendum)
Metastatic Breast Cancer: ER Pos and Her 2Neg(previously progressed on Ibrance, fulvestrant, everolimus, alpelisib, Xeloda) PriorTreatment: Taxol 09/10/2020-06/18/21 Chronic right arm lymphedema IHC: HER2 0 ----------------------------------------------------------------------------------------------------------------------- Current treatment:Palliative treatment with Sacituzumab-Govitecan, today cycle12  Toxicities: 1.Neutropenia: We reducedthe dosage of Trodelvy with cycle 2 2.Fatigue and joint stiffness 3.Chemotherapy-induced anemia today's hemoglobin isimproving and today it is 11 4.Nausea:Takes nausea medication 5.Constipation alternating with diarrhea 6.Dryness of the face and acne  Upper respiratory infection with sinusitis: Sent a prescription for azithromycin.   CT CAP 09/16/2021: Slight interval decrease in the size of the enlarged pretracheal, subcarinal, right hilar lymph nodes. Numerous small bilateral lung nodules unchanged. Numerous lesions in the liver poorly visualized. Unchanged bone metastases. CT CAP 12/31/2021: No evidence of progression.  Stable pleural nodules, stable subcarinal lymph node, stable bone metastasis, hepatic metastases unchanged.   Return to clinic for Trodelvy and follow-ups We plan to do CT scans on 04/14/2022. 

## 2022-03-10 ENCOUNTER — Other Ambulatory Visit (HOSPITAL_COMMUNITY): Payer: Self-pay

## 2022-03-11 ENCOUNTER — Other Ambulatory Visit (HOSPITAL_COMMUNITY): Payer: Self-pay

## 2022-03-11 MED FILL — Fosaprepitant Dimeglumine For IV Infusion 150 MG (Base Eq): INTRAVENOUS | Qty: 5 | Status: AC

## 2022-03-11 MED FILL — Dexamethasone Sodium Phosphate Inj 100 MG/10ML: INTRAMUSCULAR | Qty: 1 | Status: AC

## 2022-03-12 ENCOUNTER — Inpatient Hospital Stay: Payer: 59 | Attending: Oncology

## 2022-03-12 ENCOUNTER — Inpatient Hospital Stay: Payer: 59

## 2022-03-12 ENCOUNTER — Other Ambulatory Visit (HOSPITAL_COMMUNITY): Payer: Self-pay

## 2022-03-12 VITALS — BP 158/73 | HR 95 | Temp 98.3°F | Resp 18 | Ht 66.0 in | Wt 294.1 lb

## 2022-03-12 DIAGNOSIS — Z9013 Acquired absence of bilateral breasts and nipples: Secondary | ICD-10-CM | POA: Insufficient documentation

## 2022-03-12 DIAGNOSIS — Z170 Estrogen receptor positive status [ER+]: Secondary | ICD-10-CM | POA: Insufficient documentation

## 2022-03-12 DIAGNOSIS — C50112 Malignant neoplasm of central portion of left female breast: Secondary | ICD-10-CM | POA: Diagnosis not present

## 2022-03-12 DIAGNOSIS — C7951 Secondary malignant neoplasm of bone: Secondary | ICD-10-CM | POA: Diagnosis present

## 2022-03-12 DIAGNOSIS — C7800 Secondary malignant neoplasm of unspecified lung: Secondary | ICD-10-CM | POA: Insufficient documentation

## 2022-03-12 DIAGNOSIS — C50111 Malignant neoplasm of central portion of right female breast: Secondary | ICD-10-CM

## 2022-03-12 DIAGNOSIS — Z923 Personal history of irradiation: Secondary | ICD-10-CM | POA: Diagnosis not present

## 2022-03-12 DIAGNOSIS — C50211 Malignant neoplasm of upper-inner quadrant of right female breast: Secondary | ICD-10-CM | POA: Diagnosis present

## 2022-03-12 DIAGNOSIS — Z95828 Presence of other vascular implants and grafts: Secondary | ICD-10-CM

## 2022-03-12 DIAGNOSIS — C787 Secondary malignant neoplasm of liver and intrahepatic bile duct: Secondary | ICD-10-CM | POA: Insufficient documentation

## 2022-03-12 DIAGNOSIS — Z5111 Encounter for antineoplastic chemotherapy: Secondary | ICD-10-CM | POA: Insufficient documentation

## 2022-03-12 DIAGNOSIS — Z79899 Other long term (current) drug therapy: Secondary | ICD-10-CM | POA: Diagnosis not present

## 2022-03-12 LAB — CMP (CANCER CENTER ONLY)
ALT: 46 U/L — ABNORMAL HIGH (ref 0–44)
AST: 40 U/L (ref 15–41)
Albumin: 3.6 g/dL (ref 3.5–5.0)
Alkaline Phosphatase: 197 U/L — ABNORMAL HIGH (ref 38–126)
Anion gap: 7 (ref 5–15)
BUN: 15 mg/dL (ref 6–20)
CO2: 31 mmol/L (ref 22–32)
Calcium: 9.4 mg/dL (ref 8.9–10.3)
Chloride: 102 mmol/L (ref 98–111)
Creatinine: 0.88 mg/dL (ref 0.44–1.00)
GFR, Estimated: >60 mL/min (ref >60)
Glucose, Bld: 115 mg/dL — ABNORMAL HIGH (ref 70–99)
Potassium: 3.5 mmol/L (ref 3.5–5.1)
Sodium: 140 mmol/L (ref 135–145)
Total Bilirubin: 0.4 mg/dL (ref 0.3–1.2)
Total Protein: 7.1 g/dL (ref 6.5–8.1)

## 2022-03-12 LAB — CBC WITH DIFFERENTIAL (CANCER CENTER ONLY)
Abs Immature Granulocytes: 0.06 10*3/uL (ref 0.00–0.07)
Basophils Absolute: 0 10*3/uL (ref 0.0–0.1)
Basophils Relative: 1 %
Eosinophils Absolute: 0.1 10*3/uL (ref 0.0–0.5)
Eosinophils Relative: 3 %
HCT: 34.1 % — ABNORMAL LOW (ref 36.0–46.0)
Hemoglobin: 11.3 g/dL — ABNORMAL LOW (ref 12.0–15.0)
Immature Granulocytes: 2 %
Lymphocytes Relative: 17 %
Lymphs Abs: 0.7 10*3/uL (ref 0.7–4.0)
MCH: 30.7 pg (ref 26.0–34.0)
MCHC: 33.1 g/dL (ref 30.0–36.0)
MCV: 92.7 fL (ref 80.0–100.0)
Monocytes Absolute: 0.5 10*3/uL (ref 0.1–1.0)
Monocytes Relative: 14 %
Neutro Abs: 2.4 10*3/uL (ref 1.7–7.7)
Neutrophils Relative %: 63 %
Platelet Count: 253 10*3/uL (ref 150–400)
RBC: 3.68 MIL/uL — ABNORMAL LOW (ref 3.87–5.11)
RDW: 18.2 % — ABNORMAL HIGH (ref 11.5–15.5)
WBC Count: 3.8 10*3/uL — ABNORMAL LOW (ref 4.0–10.5)
nRBC: 0.5 % — ABNORMAL HIGH (ref 0.0–0.2)

## 2022-03-12 MED ORDER — SODIUM CHLORIDE 0.9 % IV SOLN
6.0000 mg/kg | Freq: Once | INTRAVENOUS | Status: AC
  Administered 2022-03-12: 790 mg via INTRAVENOUS
  Filled 2022-03-12: qty 79

## 2022-03-12 MED ORDER — PALONOSETRON HCL INJECTION 0.25 MG/5ML
0.2500 mg | Freq: Once | INTRAVENOUS | Status: AC
  Administered 2022-03-12: 0.25 mg via INTRAVENOUS
  Filled 2022-03-12: qty 5

## 2022-03-12 MED ORDER — SODIUM CHLORIDE 0.9 % IV SOLN
10.0000 mg | Freq: Once | INTRAVENOUS | Status: AC
  Administered 2022-03-12: 10 mg via INTRAVENOUS
  Filled 2022-03-12: qty 10

## 2022-03-12 MED ORDER — SODIUM CHLORIDE 0.9% FLUSH
10.0000 mL | INTRAVENOUS | Status: DC | PRN
  Administered 2022-03-12: 10 mL

## 2022-03-12 MED ORDER — ACETAMINOPHEN 325 MG PO TABS
650.0000 mg | ORAL_TABLET | Freq: Once | ORAL | Status: AC
  Administered 2022-03-12: 650 mg via ORAL
  Filled 2022-03-12: qty 2

## 2022-03-12 MED ORDER — DIPHENHYDRAMINE HCL 50 MG/ML IJ SOLN
50.0000 mg | Freq: Once | INTRAMUSCULAR | Status: AC
  Administered 2022-03-12: 50 mg via INTRAVENOUS
  Filled 2022-03-12: qty 1

## 2022-03-12 MED ORDER — ROSUVASTATIN CALCIUM 5 MG PO TABS
5.0000 mg | ORAL_TABLET | Freq: Every day | ORAL | 1 refills | Status: DC
  Filled 2022-03-12 – 2022-05-18 (×2): qty 90, 90d supply, fill #0
  Filled 2022-06-11 – 2022-08-12 (×2): qty 90, 90d supply, fill #1

## 2022-03-12 MED ORDER — SODIUM CHLORIDE 0.9 % IV SOLN: Freq: Once | INTRAVENOUS | Status: AC

## 2022-03-12 MED ORDER — FAMOTIDINE IN NACL 20-0.9 MG/50ML-% IV SOLN
20.0000 mg | Freq: Once | INTRAVENOUS | Status: AC
  Administered 2022-03-12: 20 mg via INTRAVENOUS
  Filled 2022-03-12: qty 50

## 2022-03-12 MED ORDER — SODIUM CHLORIDE 0.9 % IV SOLN
150.0000 mg | Freq: Once | INTRAVENOUS | Status: AC
  Administered 2022-03-12: 150 mg via INTRAVENOUS
  Filled 2022-03-12: qty 150

## 2022-03-12 MED ORDER — HEPARIN SOD (PORK) LOCK FLUSH 100 UNIT/ML IV SOLN
500.0000 [IU] | Freq: Once | INTRAVENOUS | Status: AC | PRN
  Administered 2022-03-12: 500 [IU]

## 2022-03-12 MED ORDER — SODIUM CHLORIDE 0.9% FLUSH
10.0000 mL | Freq: Once | INTRAVENOUS | Status: AC
  Administered 2022-03-12: 10 mL

## 2022-03-12 NOTE — Patient Instructions (Signed)
[facility] ONCOLOG[facility]  Discharge Instructions: Thank you for choosing [facility] to provide your oncology and hematology care.   If you have a lab appointment with the [facility], please go directly to the [facility] and check in at the registration area.   Wear comfortable clothing and clothing appropriate for easy access to any Portacath or PICC line.   We strive to give you quality time with your provider. You may need to reschedule your appointment if you arrive late (15 or more minutes).  Arriving late affects you and other patients whose appointments are after yours.  Also, if you miss three or more appointments without notifying the office, you may be dismissed from the clinic at the provider's discretion.      For prescription refill requests, have your pharmacy contact our office and allow 72 hours for refills to be completed.    Today you received the following chemotherapy and/or immunotherapy agents: Debbie [name]      To help prevent nausea and vomiting after your treatment, we encourage you to take your nausea medication as directed.  BELOW ARE SYMPTOMS THAT SHOULD BE REPORTED IMMEDIATELY: *FEVER GREATER THAN 100.4 F (38 C) OR HIGHER *CHILLS OR SWEATING *NAUSEA AND VOMITING THAT IS NOT CONTROLLED WITH YOUR NAUSEA MEDICATION *UNUSUAL SHORTNESS OF BREATH *UNUSUAL BRUISING OR BLEEDING *URINARY PROBLEMS (pain or burning when urinating, or frequent urination) *BOWEL PROBLEMS (unusual diarrhea, constipation, pain near the anus) TENDERNESS IN MOUTH AND THROAT WITH OR WITHOUT PRESENCE OF ULCERS (sore throat, sores in mouth, or a toothache) UNUSUAL RASH, SWELLING OR PAIN  UNUSUAL VAGINAL DISCHARGE OR ITCHING   Items with * indicate a potential emergency and should be followed up as soon as possible or go to the Emergency Department if any problems should occur.  Please show the CHEMOTHERAPY ALERT CARD or IMMUNOTHERAPY ALERT CARD at check-in to  the Emergency Department and triage nurse.  Should you have questions after your visit or need to cancel or reschedule your appointment, please contact [facility]  Dept: [phone]  and follow the prompts.  Office hours are 8:00 a.m. to 4:30 p.m. Monday - Friday. Please note that voicemails left after 4:00 p.m. may not be returned until the following business day.  We are closed weekends and major holidays. You have access to a nurse at all times for urgent questions. Please call the main number to the clinic Dept: [phone] and follow the prompts.   For any non-urgent questions, you may also contact your provider using MyChart. We now offer e-Visits for anyone 53 and older to request care online for non-urgent symptoms. For details visit [email or web address].   Also download the MyChart app! Go to the app store, search "MyChart", open the app, select [facility], and log in with your MyChart username and password.  Masks are optional in the cancer centers. If you would like for your care team to wear a mask while they are taking care of you, please let them know. You may have one support person who is at least 58 years old accompany you for your appointments.

## 2022-03-12 NOTE — Progress Notes (Signed)
Per Dr. Lindi Adie ok to proceed with treatment today with magnesium and phosphorous labs.

## 2022-03-13 LAB — CANCER ANTIGEN 27.29: CA 27.29: 153.8 U/mL — ABNORMAL HIGH (ref 0.0–38.6)

## 2022-03-16 ENCOUNTER — Other Ambulatory Visit (HOSPITAL_COMMUNITY): Payer: Self-pay

## 2022-03-16 MED ORDER — LEVOTHYROXINE SODIUM 175 MCG PO TABS
175.0000 ug | ORAL_TABLET | Freq: Every morning | ORAL | 1 refills | Status: DC
  Filled 2022-03-16: qty 90, 90d supply, fill #0
  Filled 2022-06-16: qty 90, 90d supply, fill #1

## 2022-03-19 ENCOUNTER — Other Ambulatory Visit (HOSPITAL_COMMUNITY): Payer: Self-pay

## 2022-03-19 ENCOUNTER — Encounter: Payer: Self-pay | Admitting: Adult Health

## 2022-03-20 ENCOUNTER — Other Ambulatory Visit (HOSPITAL_COMMUNITY): Payer: Self-pay

## 2022-03-20 MED ORDER — LOSARTAN POTASSIUM-HCTZ 100-25 MG PO TABS
1.0000 | ORAL_TABLET | Freq: Every day | ORAL | 11 refills | Status: DC
  Filled 2022-03-20 – 2022-06-16 (×2): qty 30, 30d supply, fill #0
  Filled 2022-07-13: qty 30, 30d supply, fill #1
  Filled 2022-08-12: qty 30, 30d supply, fill #2

## 2022-03-20 MED ORDER — LOSARTAN POTASSIUM-HCTZ 100-25 MG PO TABS
1.0000 | ORAL_TABLET | Freq: Every day | ORAL | 11 refills | Status: DC
  Filled 2022-03-20: qty 30, 30d supply, fill #0
  Filled 2022-03-20: qty 90, 90d supply, fill #0

## 2022-03-25 MED FILL — Fosaprepitant Dimeglumine For IV Infusion 150 MG (Base Eq): INTRAVENOUS | Qty: 5 | Status: AC

## 2022-03-25 MED FILL — Dexamethasone Sodium Phosphate Inj 100 MG/10ML: INTRAMUSCULAR | Qty: 1 | Status: AC

## 2022-03-26 ENCOUNTER — Other Ambulatory Visit (HOSPITAL_COMMUNITY): Payer: Self-pay

## 2022-03-26 ENCOUNTER — Other Ambulatory Visit: Payer: Self-pay | Admitting: Hematology and Oncology

## 2022-03-26 ENCOUNTER — Encounter: Payer: Self-pay | Admitting: Adult Health

## 2022-03-26 ENCOUNTER — Inpatient Hospital Stay: Payer: 59

## 2022-03-26 ENCOUNTER — Inpatient Hospital Stay (HOSPITAL_BASED_OUTPATIENT_CLINIC_OR_DEPARTMENT_OTHER): Payer: 59 | Admitting: Adult Health

## 2022-03-26 ENCOUNTER — Other Ambulatory Visit (HOSPITAL_BASED_OUTPATIENT_CLINIC_OR_DEPARTMENT_OTHER): Payer: Self-pay

## 2022-03-26 VITALS — BP 152/87 | HR 80 | Temp 97.8°F | Resp 17 | Wt 294.4 lb

## 2022-03-26 VITALS — BP 143/81 | HR 93 | Temp 98.5°F | Resp 20

## 2022-03-26 DIAGNOSIS — C50111 Malignant neoplasm of central portion of right female breast: Secondary | ICD-10-CM | POA: Diagnosis not present

## 2022-03-26 DIAGNOSIS — C787 Secondary malignant neoplasm of liver and intrahepatic bile duct: Secondary | ICD-10-CM | POA: Diagnosis not present

## 2022-03-26 DIAGNOSIS — Z95828 Presence of other vascular implants and grafts: Secondary | ICD-10-CM

## 2022-03-26 DIAGNOSIS — Z170 Estrogen receptor positive status [ER+]: Secondary | ICD-10-CM | POA: Diagnosis not present

## 2022-03-26 DIAGNOSIS — C50211 Malignant neoplasm of upper-inner quadrant of right female breast: Secondary | ICD-10-CM

## 2022-03-26 DIAGNOSIS — C7800 Secondary malignant neoplasm of unspecified lung: Secondary | ICD-10-CM

## 2022-03-26 DIAGNOSIS — E876 Hypokalemia: Secondary | ICD-10-CM

## 2022-03-26 DIAGNOSIS — C50112 Malignant neoplasm of central portion of left female breast: Secondary | ICD-10-CM | POA: Diagnosis not present

## 2022-03-26 DIAGNOSIS — C7951 Secondary malignant neoplasm of bone: Secondary | ICD-10-CM | POA: Diagnosis not present

## 2022-03-26 DIAGNOSIS — Z9013 Acquired absence of bilateral breasts and nipples: Secondary | ICD-10-CM | POA: Diagnosis not present

## 2022-03-26 DIAGNOSIS — Z923 Personal history of irradiation: Secondary | ICD-10-CM | POA: Diagnosis not present

## 2022-03-26 DIAGNOSIS — Z5111 Encounter for antineoplastic chemotherapy: Secondary | ICD-10-CM | POA: Diagnosis not present

## 2022-03-26 LAB — CBC WITH DIFFERENTIAL (CANCER CENTER ONLY)
Abs Immature Granulocytes: 0.02 10*3/uL (ref 0.00–0.07)
Basophils Absolute: 0 10*3/uL (ref 0.0–0.1)
Basophils Relative: 1 %
Eosinophils Absolute: 0.1 10*3/uL (ref 0.0–0.5)
Eosinophils Relative: 3 %
HCT: 34.3 % — ABNORMAL LOW (ref 36.0–46.0)
Hemoglobin: 11.2 g/dL — ABNORMAL LOW (ref 12.0–15.0)
Immature Granulocytes: 1 %
Lymphocytes Relative: 14 %
Lymphs Abs: 0.6 10*3/uL — ABNORMAL LOW (ref 0.7–4.0)
MCH: 30.5 pg (ref 26.0–34.0)
MCHC: 32.7 g/dL (ref 30.0–36.0)
MCV: 93.5 fL (ref 80.0–100.0)
Monocytes Absolute: 0.4 10*3/uL (ref 0.1–1.0)
Monocytes Relative: 10 %
Neutro Abs: 3.1 10*3/uL (ref 1.7–7.7)
Neutrophils Relative %: 71 %
Platelet Count: 238 10*3/uL (ref 150–400)
RBC: 3.67 MIL/uL — ABNORMAL LOW (ref 3.87–5.11)
RDW: 17.9 % — ABNORMAL HIGH (ref 11.5–15.5)
WBC Count: 4.2 10*3/uL (ref 4.0–10.5)
nRBC: 0 % (ref 0.0–0.2)

## 2022-03-26 LAB — CMP (CANCER CENTER ONLY)
ALT: 46 U/L — ABNORMAL HIGH (ref 0–44)
AST: 39 U/L (ref 15–41)
Albumin: 3.7 g/dL (ref 3.5–5.0)
Alkaline Phosphatase: 217 U/L — ABNORMAL HIGH (ref 38–126)
Anion gap: 7 (ref 5–15)
BUN: 11 mg/dL (ref 6–20)
CO2: 31 mmol/L (ref 22–32)
Calcium: 9.3 mg/dL (ref 8.9–10.3)
Chloride: 104 mmol/L (ref 98–111)
Creatinine: 0.72 mg/dL (ref 0.44–1.00)
GFR, Estimated: >60 mL/min (ref >60)
Glucose, Bld: 105 mg/dL — ABNORMAL HIGH (ref 70–99)
Potassium: 3.5 mmol/L (ref 3.5–5.1)
Sodium: 142 mmol/L (ref 135–145)
Total Bilirubin: 0.3 mg/dL (ref 0.3–1.2)
Total Protein: 7.1 g/dL (ref 6.5–8.1)

## 2022-03-26 MED ORDER — SODIUM CHLORIDE 0.9% FLUSH
10.0000 mL | Freq: Once | INTRAVENOUS | Status: AC
  Administered 2022-03-26: 10 mL

## 2022-03-26 MED ORDER — DIPHENHYDRAMINE HCL 50 MG/ML IJ SOLN
50.0000 mg | Freq: Once | INTRAMUSCULAR | Status: AC
  Administered 2022-03-26: 50 mg via INTRAVENOUS
  Filled 2022-03-26: qty 1

## 2022-03-26 MED ORDER — HEPARIN SOD (PORK) LOCK FLUSH 100 UNIT/ML IV SOLN
500.0000 [IU] | Freq: Once | INTRAVENOUS | Status: AC | PRN
  Administered 2022-03-26: 500 [IU]

## 2022-03-26 MED ORDER — ATROPINE SULFATE 1 MG/ML IV SOLN
0.5000 mg | Freq: Once | INTRAVENOUS | Status: DC | PRN
  Filled 2022-03-26: qty 1

## 2022-03-26 MED ORDER — POTASSIUM CHLORIDE ER 10 MEQ PO TBCR
20.0000 meq | EXTENDED_RELEASE_TABLET | Freq: Every day | ORAL | 1 refills | Status: DC
  Filled 2022-03-26: qty 60, 30d supply, fill #0
  Filled 2022-04-21: qty 60, 30d supply, fill #1

## 2022-03-26 MED ORDER — SODIUM CHLORIDE 0.9 % IV SOLN
150.0000 mg | Freq: Once | INTRAVENOUS | Status: AC
  Administered 2022-03-26: 150 mg via INTRAVENOUS
  Filled 2022-03-26: qty 150

## 2022-03-26 MED ORDER — ACETAMINOPHEN 325 MG PO TABS
650.0000 mg | ORAL_TABLET | Freq: Once | ORAL | Status: AC
  Administered 2022-03-26: 650 mg via ORAL
  Filled 2022-03-26: qty 2

## 2022-03-26 MED ORDER — DENOSUMAB 120 MG/1.7ML ~~LOC~~ SOLN
120.0000 mg | Freq: Once | SUBCUTANEOUS | Status: AC
  Administered 2022-03-26: 120 mg via SUBCUTANEOUS
  Filled 2022-03-26: qty 1.7

## 2022-03-26 MED ORDER — SODIUM CHLORIDE 0.9 % IV SOLN
6.0000 mg/kg | Freq: Once | INTRAVENOUS | Status: AC
  Administered 2022-03-26: 790 mg via INTRAVENOUS
  Filled 2022-03-26: qty 79

## 2022-03-26 MED ORDER — SODIUM CHLORIDE 0.9% FLUSH
10.0000 mL | INTRAVENOUS | Status: DC | PRN
  Administered 2022-03-26: 10 mL

## 2022-03-26 MED ORDER — PALONOSETRON HCL INJECTION 0.25 MG/5ML
0.2500 mg | Freq: Once | INTRAVENOUS | Status: AC
  Administered 2022-03-26: 0.25 mg via INTRAVENOUS
  Filled 2022-03-26: qty 5

## 2022-03-26 MED ORDER — FAMOTIDINE IN NACL 20-0.9 MG/50ML-% IV SOLN
20.0000 mg | Freq: Once | INTRAVENOUS | Status: AC
  Administered 2022-03-26: 20 mg via INTRAVENOUS
  Filled 2022-03-26: qty 50

## 2022-03-26 MED ORDER — SODIUM CHLORIDE 0.9 % IV SOLN
10.0000 mg | Freq: Once | INTRAVENOUS | Status: AC
  Administered 2022-03-26: 10 mg via INTRAVENOUS
  Filled 2022-03-26: qty 10

## 2022-03-26 MED ORDER — SODIUM CHLORIDE 0.9 % IV SOLN: Freq: Once | INTRAVENOUS | Status: AC

## 2022-03-26 NOTE — Progress Notes (Signed)
Per Dr. Lindi Adie, ok to use Magnesium and Phosphorus results from 03/05/22 for treatment today.

## 2022-03-26 NOTE — Assessment & Plan Note (Signed)
Debbie [name] is a 56 year old woman with metastatic breast cancer who has received multiple lines of treatment previously here today for treatment with Debbie [name] which she has been on since March 2023.  Debbie [name] has no clinical signs of progression of her breast cancer.  Her labs are stable and she will proceed with treatment with Debbie [name] today.  We discussed her potassium and I sent in a refill of potassium tablets to her pharmacy.  She is doing a good job managing her constipation with Colace.  She continues to work and is managing her fatigue well with energy conservation.  Debbie [name] return next week for day 8 of her Trodelvy infusion and in 3 weeks for her next cycle.

## 2022-03-26 NOTE — Progress Notes (Signed)
[facility] Follow up:    Debbie Pillar, MD [address]  [place] [address]   DIAGNOSIS:  Cancer Staging  Malignant neoplasm of central portion of left breast in female, estrogen receptor positive ([place]) Staging form: Breast, AJCC 7th Edition - Pathologic: Stage IA (T1c, N0, cM0) - Unsigned - Pathologic: Stage IV (M1) - Unsigned  Malignant neoplasm of upper-inner quadrant of right breast in female, estrogen receptor positive ([place]) Staging form: Breast, AJCC 7th Edition - Clinical stage from 10/23/2015: Stage IIA (T2, N0, M0) - Unsigned Staged by: Pathologist and managing physician Laterality: Right Estrogen receptor status: Positive Progesterone receptor status: Positive HER2 status: Negative Stage used in treatment planning: Yes National guidelines used in treatment planning: Yes Type of national guideline used in treatment planning: NCCN - Pathologic: Stage IIB (T2, N1, cM0) - Unsigned - Pathologic: Stage IV (M1) - Unsigned   SUMMARY OF ONCOLOGIC HISTORY: Oncology History  Malignant neoplasm of upper-inner quadrant of right breast in female, estrogen receptor positive ([place])  10/15/2015 Initial Biopsy   Right breast upper inner quadrant biopsy: IDC, DCIS, grade 2, ER+(90%), PR+(50%), Ki67 70%, HER-2 negative (ratio 1.18).   10/17/2015 Initial Diagnosis   Malignant neoplasm of upper-inner quadrant of right breast in female, estrogen receptor positive ([place])   10/2015 -  Anti-estrogen oral therapy   Tamoxifen daily   11/28/2015 Surgery   Left breast mammoplasty: IDC, grade 1, 1.2cm, DCIS, lobular neoplasia, T1c, Nx, ER 80%, PR60%, Ki67 3% HER-2 neg Right breast lumpectomy: IDC, grade 1, 2.3cm, DCIS. T2N1a ER/PR+HER-2 neg, Ki 70%.   11/28/2015 Miscellaneous   Mammaprint was Low Risk. Predicting a 98% chance of no disease recurrence within 5 years with anti-estrogens as the only systemic therapy.  It also predicts minimal to no benefit from  chemotherapy.     12/26/2015 Surgery   Bilateral mastectomies: bilateral sentinel node sampling, Right: no residual carcinoma, left, focal DCIS, 4 SLN negative   02/11/2016 - 03/27/2016 Radiation Therapy   Adjuvant radiation [name]): 1) Right chest wall: 50.4 Gy in 28 fractions  2) Right chest wall boost: 10 Gy in 5 fractions   08/04/2017 Relapse/Recurrence   08/04/2017, MRI of the liver and total spine MRI 08/11/2017 showed liver and bone metastasis; liver biopsy 09/07/2017 confirms estrogen receptor positive, progesterone receptor negative, HER-2 negative metastatic breast cancer; chest CT scan 08/13/2017 shows multiple bilateral pulmonary nodules.   08/17/2017 - 01/04/2018 Anti-estrogen oral therapy   fulvestrant  palbociclib started 08/31/2017 discontinued 01/04/2018 with evidence of progression in the liver   08/2017 Miscellaneous   [facility] report on liver biopsy from April 2019 shows a negative PDL 1, stable MSI and proficient mismatch repair status.  The tumor is positive for the androgen receptor, and for PTEN for PIK3 CA mutations.  HER-2/neu was read as equivocal (it was negative by FISH on the pathology report).   01/04/2018 Miscellaneous   anastrozole started 01/04/2018 everolimus started 01/29/2018   02/16/2018 - 03/01/2018 Radiation Therapy   01/28/18 prophylactic left femur intramedullary nail surgery for impending left femur pathologic fracture alliative radiation with Dr. [name] to the left femur and left ilium from 02/16/2018 to 03/01/2018   01/30/2020 - 05/2020 Anti-estrogen oral therapy   alpelisib started 01/30/2020 at 300 mg daily with Metformin 500 mg daily and Faslodex (stopped for progression)   05/22/2020 - 08/2020 Chemotherapy   Xeloda discontinued for progression   09/10/2020 - 06/18/2021 Chemotherapy   Patient is on Treatment Plan : BREAST Paclitaxel D1,D15  q28d     07/17/2021 - 12/18/2021 Chemotherapy   Patient is on Treatment Plan : BREAST METASTATIC Sacituzumab  govitecan-hziy [name]) q21d     01/01/2022 -  Chemotherapy   Patient is on Treatment Plan : BREAST METASTATIC Sacituzumab govitecan-hziy [name]) D1,8 q21d     Bilateral malignant neoplasm of breast in female, estrogen receptor positive ([place])  12/26/2015 Initial Diagnosis   Bilateral malignant neoplasm of breast in female, estrogen receptor positive ([place])   07/17/2021 - 12/18/2021 Chemotherapy   Patient is on Treatment Plan : BREAST METASTATIC Sacituzumab govitecan-hziy [name]) q21d     01/01/2022 -  Chemotherapy   Patient is on Treatment Plan : BREAST METASTATIC Sacituzumab govitecan-hziy [name]) D1,8 q21d     Malignant neoplasm metastatic to liver ([place])  08/13/2017 Initial Diagnosis   Liver metastases ([place])   07/17/2021 - 12/18/2021 Chemotherapy   Patient is on Treatment Plan : BREAST METASTATIC Sacituzumab govitecan-hziy [name]) q21d     01/01/2022 -  Chemotherapy   Patient is on Treatment Plan : BREAST METASTATIC Sacituzumab govitecan-hziy [name]) D1,8 q21d     Malignant neoplasm metastatic to lung ([place])  09/10/2020 Initial Diagnosis   Lung metastases ([place])   07/17/2021 - 12/18/2021 Chemotherapy   Patient is on Treatment Plan : BREAST METASTATIC Sacituzumab govitecan-hziy [name]) q21d     01/01/2022 -  Chemotherapy   Patient is on Treatment Plan : BREAST METASTATIC Sacituzumab govitecan-hziy [name]) D1,8 q21d       CURRENT THERAPY: [name]  INTERVAL HISTORY: Debbie [name] 56 y.o. female returns for follow-up and evaluation prior to receiving Trodelvy chemotherapy for her metastatic breast cancer.  She receives this on days 1 and 8 of a 21-day cycle.  Her most recent restaging occurred on August 22 and she is scheduled for repeat restaging on April 14, 2022.  He tells me she is doing quite well today.  She is only taking her potassium tablets daily.  She was not able to tolerate the potassium liquid.  She notes some mild fatigue and some joint aches and  pains but otherwise has no new pain.  She denies any shortness of breath cough or abdominal pain.  She has mild constipation which she is managing with Colace.   Patient Active Problem List   Diagnosis Date Noted   Port-A-Cath in place 08/27/2021   Encounter for antineoplastic chemotherapy 10/08/2020   Lower extremity edema 10/08/2020   Malignant neoplasm metastatic to lung [facility]) 09/10/2020   Goals of care, counseling/discussion 12/27/2018   Osteonecrosis ([place]) 11/01/2018   Pathological fracture in neoplastic disease, left femur, initial encounter for fracture ([place]) 01/28/2018   Morbid obesity with BMI of 40.0-44.9, adult ([place]) 62/56/3893   IUD complication ([place]) 73/42/8768   Bone metastases 08/13/2017   Pain from bone metastases ([place]) 08/13/2017   Malignant neoplasm metastatic to liver ([place]) 08/13/2017   Steatosis of liver 08/13/2017   Bilateral malignant neoplasm of breast in female, estrogen receptor positive ([place]) 12/26/2015   Malignant neoplasm of central portion of left breast in female, estrogen receptor positive ([place]) 11/28/2015   Malignant neoplasm of upper-inner quadrant of right breast in female, estrogen receptor positive ([place]) 10/17/2015   Hypertension 04/19/2012   Hypothyroid 04/19/2012   Fibroids 04/19/2012   Stress incontinence, female 04/19/2012    is allergic to ace inhibitors, atorvastatin, simvastatin, and percocet [oxycodone-acetaminophen].  MEDICAL HISTORY: Past Medical History:  Diagnosis Date   Anxiety    Arthritis    Bone metastasis    Breast cancer of upper-inner  quadrant of right female breast [facility]) oncologist-  dr [name]--- 04/ 2019 ER/PR+ Stage IV w/ metastatic disease to liver, total spine, lung, bone   dx 06/ 2017  right breast invasive ductal carcinoma, Stage IIB, ER/PR + (pT2 pN1), grade 1-- s/p  right breast lumpecotmy w/ sln bx's and bilateral breast reduction 11-28-2015/  left breast with reduction , dx invasive ductal ca, Grade 1 (pT1cpNX)  ER/PR positive/  adjuvant radiation completed 03-27-2016 to right chest wall, 2018  reclassifiec Stage IIA   Cancer, metastatic to liver [facility])    Chronic back pain    Constipation    Depression    Family history of adverse reaction to anesthesia     mother has Copd was kept on vent    Headache    Heart murmur    History of hyperthyroidism    s/p  RAI   History of radiation therapy 02/11/16- 03/27/16   Right Chest Wall 50.4 Gy in 28 fractions, Right Chest Wall Boost 10 Gy in 5 fractions.    Hyperlipidemia    Hypertension    Hypothyroidism, postradioiodine therapy    endocrinoloigst-  dr balan--  2004  s/p  RAI   Menorrhagia 2011   Multiple pulmonary nodules    secondary  metastatic   PONV (postoperative nausea and vomiting)    patch helps    SUI (stress urinary incontinence, female)    Wears contact lenses    Wears dentures    full upper and lower partial    SURGICAL HISTORY: Past Surgical History:  Procedure Laterality Date   BREAST LUMPECTOMY WITH NEEDLE LOCALIZATION AND AXILLARY SENTINEL LYMPH NODE BX Right 11/28/2015   Procedure: RIGHT BREAST LUMPECTOMY WITH NEEDLE LOCALIZATION AND AXILLARY SENTINEL LYMPH NODE BX;  Surgeon: Autumn Messing III, MD;  Location: [place] [facility];  Service: General;  Laterality: Right;   BREAST REDUCTION SURGERY Bilateral 11/28/2015   Procedure: MAMMARY REDUCTION  (BREAST) BILATERAL;  Surgeon: Crissie Reese, MD;  Location: [place];  Service: Plastics;  Laterality: Bilateral;   CESAREAN SECTION  01/12/1993   COLONOSCOPY     Dental surgeries     FEMUR IM NAIL Left 01/28/2018   Procedure: INTRAMEDULLARY (IM) NAIL FEMORAL;  Surgeon: Nicholes Stairs, MD;  Location: [place];  Service: Orthopedics;  Laterality: Left;   HYSTEROSCOPY N/A 09/02/2017   Procedure: HYSTEROSCOPY  to remove IUD;  Surgeon: Eldred Manges, MD;  Location: [place];  Service: Gynecology;  Laterality: N/A;   IR IMAGING GUIDED PORT INSERTION  09/16/2020   Liver biospy      MASTECTOMY     MASTECTOMY W/ SENTINEL NODE BIOPSY Bilateral 12/26/2015   Procedure: BILATERAL MASTECTOMY WITH LEFT SENTINEL LYMPH NODE BIOPSY;  Surgeon: Autumn Messing III, MD;  Location: [place];  Service: General;  Laterality: Bilateral;   Removal of IUD     TUBAL LIGATION Bilateral 1995    SOCIAL HISTORY: Social History   Socioeconomic History   Marital status: Married    Spouse name: Not on file   Number of children: Not on file   Years of education: Not on file   Highest education level: Not on file  Occupational History   Not on file  Tobacco Use   Smoking status: Never   Smokeless tobacco: Never  Vaping Use   Vaping Use: Never used  Substance and Sexual Activity   Alcohol use: No   Drug use: No   Sexual activity: Not on file    Comment: BTL  Other Topics Concern   Not on file  Social History Narrative   Not on file   Social Determinants of Health   Financial Resource Strain: Not on file  Food Insecurity: Not on file  Transportation Needs: No Transportation Needs (04/01/2018)   PRAPARE - Hydrologist (Medical): No    Lack of Transportation (Non-Medical): No  Physical Activity: Not on file  Stress: Not on file  Social Connections: Not on file  Intimate Partner Violence: Not At Risk (01/18/2018)   Humiliation, Afraid, Rape, and Kick questionnaire    Fear of Current or Ex-Partner: No    Emotionally Abused: No    Physically Abused: No    Sexually Abused: No    FAMILY HISTORY: Family History  Problem Relation Age of Onset   Diabetes Mother    Hypertension Mother    Hypertension Father    Diabetes Maternal Grandmother    Hypertension Sister    Cancer Paternal Grandmother        colon   Colon cancer Paternal Grandmother    Esophageal cancer Neg Hx    Stomach cancer Neg Hx    Rectal cancer Neg Hx     Review of Systems  Constitutional:  Negative for appetite change, chills, fatigue, fever and unexpected weight change.  HENT:    Negative for hearing loss, lump/mass and trouble swallowing.   Eyes:  Negative for eye problems and icterus.  Respiratory:  Negative for chest tightness, cough and shortness of breath.   Cardiovascular:  Negative for chest pain, leg swelling and palpitations.  Gastrointestinal:  Negative for abdominal distention, abdominal pain, constipation, diarrhea, nausea and vomiting.  Endocrine: Negative for hot flashes.  Genitourinary:  Negative for difficulty urinating.   Musculoskeletal:  Negative for arthralgias.  Skin:  Negative for itching and rash.  Neurological:  Negative for dizziness, extremity weakness, headaches and numbness.  Hematological:  Negative for adenopathy. Does not bruise/bleed easily.  Psychiatric/Behavioral:  Negative for depression. The patient is not nervous/anxious.       PHYSICAL EXAMINATION  ECOG PERFORMANCE STATUS: 1 - Symptomatic but completely ambulatory  Vitals:   03/26/22 0813  BP: (!) 152/87  Pulse: 80  Resp: 17  Temp: 97.8 F (36.6 C)  SpO2: 97%    Physical Exam Constitutional:      General: She is not in acute distress.    Appearance: Normal appearance. She is not toxic-appearing.  HENT:     Head: Normocephalic and atraumatic.  Eyes:     General: No scleral icterus. Cardiovascular:     Rate and Rhythm: Normal rate and regular rhythm.     Pulses: Normal pulses.     Heart sounds: Normal heart sounds.  Pulmonary:     Effort: Pulmonary effort is normal.     Breath sounds: Normal breath sounds.  Abdominal:     General: Abdomen is flat. Bowel sounds are normal. There is no distension.     Palpations: Abdomen is soft.     Tenderness: There is no abdominal tenderness.  Musculoskeletal:        General: No swelling.     Cervical back: Neck supple.  Lymphadenopathy:     Cervical: No cervical adenopathy.  Skin:    General: Skin is warm and dry.     Findings: No rash.  Neurological:     General: No focal deficit present.     Mental Status: She  is alert.  Psychiatric:  Mood and Affect: Mood normal.        Behavior: Behavior normal.     LABORATORY DATA:  CBC    Component Value Date/Time   WBC 4.2 03/26/2022 0751   WBC 7.2 07/16/2021 1030   RBC 3.67 (L) 03/26/2022 0751   HGB 11.2 (L) 03/26/2022 0751   HGB 13.2 07/08/2016 0948   HCT 34.3 (L) 03/26/2022 0751   HCT 39.9 07/08/2016 0948   PLT 238 03/26/2022 0751   PLT 276 07/08/2016 0948   MCV 93.5 03/26/2022 0751   MCV 88.1 07/08/2016 0948   MCH 30.5 03/26/2022 0751   MCHC 32.7 03/26/2022 0751   RDW 17.9 (H) 03/26/2022 0751   RDW 14.4 07/08/2016 0948   LYMPHSABS 0.6 (L) 03/26/2022 0751   LYMPHSABS 1.1 07/08/2016 0948   MONOABS 0.4 03/26/2022 0751   MONOABS 0.4 07/08/2016 0948   EOSABS 0.1 03/26/2022 0751   EOSABS 0.1 07/08/2016 0948   BASOSABS 0.0 03/26/2022 0751   BASOSABS 0.0 07/08/2016 0948    CMP     Component Value Date/Time   NA 142 03/26/2022 0751   NA 140 07/08/2016 0948   K 3.5 03/26/2022 0751   K 4.1 07/08/2016 0948   CL 104 03/26/2022 0751   CO2 31 03/26/2022 0751   CO2 24 07/08/2016 0948   GLUCOSE 105 (H) 03/26/2022 0751   GLUCOSE 89 07/08/2016 0948   BUN 11 03/26/2022 0751   BUN 11.9 07/08/2016 0948   CREATININE 0.72 03/26/2022 0751   CREATININE 0.9 07/08/2016 0948   CALCIUM 9.3 03/26/2022 0751   CALCIUM 10.0 07/08/2016 0948   PROT 7.1 03/26/2022 0751   PROT 7.6 07/08/2016 0948   ALBUMIN 3.7 03/26/2022 0751   ALBUMIN 3.6 07/08/2016 0948   AST 39 03/26/2022 0751   AST 22 07/08/2016 0948   ALT 46 (H) 03/26/2022 0751   ALT 27 07/08/2016 0948   ALKPHOS 217 (H) 03/26/2022 0751   ALKPHOS 115 07/08/2016 0948   BILITOT 0.3 03/26/2022 0751   BILITOT 0.35 07/08/2016 0948   GFRNONAA >60 03/26/2022 0751   GFRAA >60 01/30/2020 0814   GFRAA >60 05/16/2019 0905         ASSESSMENT and THERAPY PLAN:   Malignant neoplasm of upper-inner quadrant of right breast in female, estrogen receptor positive ([place]) Karleigh Bunte is a  56 year old woman with metastatic breast cancer who has received multiple lines of treatment previously here today for treatment with [name] which she has been on since March 2023.  [place] has no clinical signs of progression of her breast cancer.  Her labs are stable and she will proceed with treatment with [name] today.  We discussed her potassium and I sent in a refill of potassium tablets to her pharmacy.  She is doing a good job managing her constipation with Colace.  She continues to work and is managing her fatigue well with energy conservation.  Debbie [name] return next week for day 8 of her Trodelvy infusion and in 3 weeks for her next cycle.   All questions were answered. The patient knows to call the clinic with any problems, questions or concerns. We can certainly see the patient much sooner if necessary.  Total encounter time:20 minutes*in face-to-face visit time, chart review, lab review, care coordination, order entry, and documentation of the encounter time.    Wilber Bihari, NP 03/26/22 8:56 AM Medical Oncology and Hematology [facility] [place], [place] [address] Tel. [phone]    Fax. [phone]  *Total  Encounter Time as defined by the Centers for Medicare and Medicaid Services includes, in addition to the face-to-face time of a patient visit (documented in the note above) non-face-to-face time: obtaining and reviewing outside history, ordering and reviewing medications, tests or procedures, care coordination (communications with other health care professionals or caregivers) and documentation in the medical record.

## 2022-03-26 NOTE — Patient Instructions (Addendum)
[facility]  Discharge Instructions: Thank you for choosing [facility] to provide your oncology and hematology care.   If you have a lab appointment with the [facility], please go directly to the [facility] and check in at the registration area.   Wear comfortable clothing and clothing appropriate for easy access to any Portacath or PICC line.   We strive to give you quality time with your provider. You may need to reschedule your appointment if you arrive late (15 or more minutes).  Arriving late affects you and other patients whose appointments are after yours.  Also, if you miss three or more appointments without notifying the office, you may be dismissed from the clinic at the provider's discretion.      For prescription refill requests, have your pharmacy contact our office and allow 72 hours for refills to be completed.    Today you received the following chemotherapy and/or immunotherapy agent: [name]   To help prevent nausea and vomiting after your treatment, we encourage you to take your nausea medication as directed.  BELOW ARE SYMPTOMS THAT SHOULD BE REPORTED IMMEDIATELY: *FEVER GREATER THAN 100.4 F (38 C) OR HIGHER *CHILLS OR SWEATING *NAUSEA AND VOMITING THAT IS NOT CONTROLLED WITH YOUR NAUSEA MEDICATION *UNUSUAL SHORTNESS OF BREATH *UNUSUAL BRUISING OR BLEEDING *URINARY PROBLEMS (pain or burning when urinating, or frequent urination) *BOWEL PROBLEMS (unusual diarrhea, constipation, pain near the anus) TENDERNESS IN MOUTH AND THROAT WITH OR WITHOUT PRESENCE OF ULCERS (sore throat, sores in mouth, or a toothache) UNUSUAL RASH, SWELLING OR PAIN  UNUSUAL VAGINAL DISCHARGE OR ITCHING   Items with * indicate a potential emergency and should be followed up as soon as possible or go to the Emergency Department if any problems should occur.  Please show the CHEMOTHERAPY ALERT CARD or IMMUNOTHERAPY ALERT CARD at check-in to the  Emergency Department and triage nurse.  Should you have questions after your visit or need to cancel or reschedule your appointment, please contact [facility]  Dept: [phone]  and follow the prompts.  Office hours are 8:00 a.m. to 4:30 p.m. Monday - Friday. Please note that voicemails left after 4:00 p.m. may not be returned until the following business day.  We are closed weekends and major holidays. You have access to a nurse at all times for urgent questions. Please call the main number to the clinic Dept: [phone] and follow the prompts.   For any non-urgent questions, you may also contact your provider using MyChart. We now offer e-Visits for anyone 49 and older to request care online for non-urgent symptoms. For details visit [email or web address].   Also download the MyChart app! Go to the app store, search "MyChart", open the app, select [facility], and log in with your MyChart username and password.  Masks are optional in the cancer centers. If you would like for your care team to wear a mask while they are taking care of you, please let them know. You may have one support person who is at least 58 years old accompany you for your appointments. Sacituzumab Govitecan Injection What is this medication? SACITUZUMAB GOVITECAN (SAK i TOOZ ue mab GOE vi TEE kan) treats breast cancer. It may also be used to treat bladder cancer and kidney cancer. It works by blocking a protein that causes cancer cells to grow and multiply. This helps to slow or stop the spread of cancer cells. This medicine may be used for other  purposes; ask your health care provider or pharmacist if you have questions. COMMON BRAND NAME(S): TRODELVY What should I tell my care team before I take this medication? They need to know if you have any of these conditions: Carry the UGT1A1*28 gene Infection Liver disease An unusual or allergic reaction to sacituzumab govitecan, other  medications, foods, dyes, or preservatives Pregnant or trying to get pregnant Breast-feeding How should I use this medication? This medication is injected into a vein. It is given by your care team in a hospital or clinic setting. Talk to your care team about the use of this medication in children. Special care may be needed. Overdosage: If you think you have taken too much of this medicine contact a poison control center or emergency room at once. NOTE: This medicine is only for you. Do not share this medicine with others. What if I miss a dose? Keep appointments for follow-up doses. It is important not to miss your dose. Call your care team if you are unable to keep an appointment. What may interact with this medication? This medication may affect how other medications work, and other medications may affect the way this medication works. Talk with your care team about all of the medications you take. They may suggest changes to your treatment plan to lower the risk of side effects and to make sure your medications work as intended. This list may not describe all possible interactions. Give your health care provider a list of all the medicines, herbs, non-prescription drugs, or dietary supplements you use. Also tell them if you smoke, drink alcohol, or use illegal drugs. Some items may interact with your medicine. What should I watch for while using this medication? This medication may make you feel generally unwell. This is not uncommon as chemotherapy can affect healthy cells as well as cancer cells. Report any side effects. Continue your course of treatment even though you feel ill unless your care team tells you to stop. You may need blood work while you are taking this medication. Certain genetic factors may decrease the safety of this medication. Your care team may use genetic tests to determine treatment. This medication can cause serious allergic reactions. To reduce your risk, your care  team may give you other medications to take before receiving this one. Be sure to follow the directions from your care team. Check with your care team if you have severe diarrhea, nausea, and vomiting, or if you sweat a lot. The loss of too much body fluid may make it dangerous for you to take this medication. Talk to your care team if you wish to become pregnant or think you might be pregnant. This medication can cause serious birth defects if taken during pregnancy or if you get pregnant within 6 months after stopping treatment. A negative pregnancy test is required before starting this medication. A reliable form of contraception is recommended while taking this medication and for 6 months after stopping treatment. Talk to your care team about reliable forms of contraception. Use a condom during sex and for 3 months after stopping treatment. Tell your care team right away if you think your partner might be pregnant. This medication can cause serious birth defects. Do not breast-feed while taking this medication and for 1 month after stopping therapy. This medication may cause infertility. Talk to your care team if you are concerned about your fertility. This medication may increase your risk of getting an infection. Call your care team for advice if  you get a fever, chills, sore throat, or other symptoms of a cold or flu. Do not treat yourself. Try to avoid being around people who are sick. Avoid taking medications that contain aspirin, acetaminophen, ibuprofen, naproxen, or ketoprofen unless instructed by your care team. These medications may hide a fever. This medication may increase blood sugar. The risk may be higher in patients who already have diabetes. Ask your care team what you can do to lower your risk of diabetes while taking this medication. What side effects may I notice from receiving this medication? Side effects that you should report to your care team as soon as possible: Allergic  reactions--skin rash, itching, hives, swelling of the face, lips, tongue, or throat Infection--fever, chills, cough, or sore throat Infusion reactions--chest pain, shortness of breath or trouble breathing, feeling faint or lightheaded Low red blood cell level--unusual weakness or fatigue, dizziness, headache, trouble breathing Severe or prolonged diarrhea Side effects that usually do not require medical attention (report these to your care team if they continue or are bothersome): Constipation Diarrhea Fatigue Hair loss Loss of appetite Nausea Vomiting This list may not describe all possible side effects. Call your doctor for medical advice about side effects. You may report side effects to FDA at 1-800-FDA-1088. Where should I keep my medication? This medication is given in a hospital or clinic. It will not be stored at home. NOTE: This sheet is a summary. It may not cover all possible information. If you have questions about this medicine, talk to your doctor, pharmacist, or health care provider.  2023 Elsevier/Gold Standard (2021-06-26 00:00:00) Denosumab Injection (Oncology) What is this medication? DENOSUMAB (den oh SUE mab) prevents weakened bones caused by cancer. It may also be used to treat noncancerous bone tumors that cannot be removed by surgery. It can also be used to treat high calcium levels in the blood caused by cancer. It works by blocking a protein that causes bones to break down quickly. This slows down the release of calcium from bones, which lowers calcium levels in your blood. It also makes your bones stronger and less likely to break (fracture). This medicine may be used for other purposes; ask your health care provider or pharmacist if you have questions. COMMON BRAND NAME(S): XGEVA What should I tell my care team before I take this medication? They need to know if you have any of these conditions: Dental disease Having surgery or tooth  extraction Infection Kidney disease Low levels of calcium or vitamin D in the blood Malnutrition On hemodialysis Skin conditions or sensitivity Thyroid or parathyroid disease An unusual reaction to denosumab, other medications, foods, dyes, or preservatives Pregnant or trying to get pregnant Breast-feeding How should I use this medication? This medication is for injection under the skin. It is given by your care team in a hospital or clinic setting. A special MedGuide will be given to you before each treatment. Be sure to read this information carefully each time. Talk to your care team about the use of this medication in children. While it may be prescribed for children as young as 13 years for selected conditions, precautions do apply. Overdosage: If you think you have taken too much of this medicine contact a poison control center or emergency room at once. NOTE: This medicine is only for you. Do not share this medicine with others. What if I miss a dose? Keep appointments for follow-up doses. It is important not to miss your dose. Call your care team if you  are unable to keep an appointment. What may interact with this medication? Do not take this medication with any of the following: Other medications containing denosumab This medication may also interact with the following: Medications that lower your chance of fighting infection Steroid medications, such as prednisone or cortisone This list may not describe all possible interactions. Give your health care provider a list of all the medicines, herbs, non-prescription drugs, or dietary supplements you use. Also tell them if you smoke, drink alcohol, or use illegal drugs. Some items may interact with your medicine. What should I watch for while using this medication? Your condition will be monitored carefully while you are receiving this medication. You may need blood work while taking this medication. This medication may increase  your risk of getting an infection. Call your care team for advice if you get a fever, chills, sore throat, or other symptoms of a cold or flu. Do not treat yourself. Try to avoid being around people who are sick. You should make sure you get enough calcium and vitamin D while you are taking this medication, unless your care team tells you not to. Discuss the foods you eat and the vitamins you take with your care team. Some people who take this medication have severe bone, joint, or muscle pain. This medication may also increase your risk for jaw problems or a broken thigh bone. Tell your care team right away if you have severe pain in your jaw, bones, joints, or muscles. Tell your care team if you have any pain that does not go away or that gets worse. Talk to your care team if you may be pregnant. Serious birth defects can occur if you take this medication during pregnancy and for 5 months after the last dose. You will need a negative pregnancy test before starting this medication. Contraception is recommended while taking this medication and for 5 months after the last dose. Your care team can help you find the option that works for you. What side effects may I notice from receiving this medication? Side effects that you should report to your care team as soon as possible: Allergic reactions--skin rash, itching, hives, swelling of the face, lips, tongue, or throat Bone, joint, or muscle pain Low calcium level--muscle pain or cramps, confusion, tingling, or numbness in the hands or feet Osteonecrosis of the jaw--pain, swelling, or redness in the mouth, numbness of the jaw, poor healing after dental work, unusual discharge from the mouth, visible bones in the mouth Side effects that usually do not require medical attention (report to your care team if they continue or are bothersome): Cough Diarrhea Fatigue Headache Nausea This list may not describe all possible side effects. Call your doctor for  medical advice about side effects. You may report side effects to FDA at 1-800-FDA-1088. Where should I keep my medication? This medication is given in a hospital or clinic. It will not be stored at home. NOTE: This sheet is a summary. It may not cover all possible information. If you have questions about this medicine, talk to your doctor, pharmacist, or health care provider.  2023 Elsevier/Gold Standard (2021-09-15 00:00:00)

## 2022-03-27 ENCOUNTER — Other Ambulatory Visit (HOSPITAL_COMMUNITY): Payer: Self-pay

## 2022-03-31 ENCOUNTER — Encounter: Payer: Self-pay | Admitting: Nutrition

## 2022-03-31 ENCOUNTER — Inpatient Hospital Stay: Payer: 59 | Admitting: Nutrition

## 2022-03-31 MED FILL — Dexamethasone Sodium Phosphate Inj 100 MG/10ML: INTRAMUSCULAR | Qty: 1 | Status: AC

## 2022-03-31 MED FILL — Fosaprepitant Dimeglumine For IV Infusion 150 MG (Base Eq): INTRAVENOUS | Qty: 5 | Status: AC

## 2022-03-31 NOTE — Assessment & Plan Note (Signed)
Metastatic Breast Cancer: ER Pos and Her 2 Neg (previously progressed on Ibrance, fulvestrant, everolimus, alpelisib, Xeloda) Prior Treatment: Taxol 09/10/2020-06/18/21 Chronic right arm lymphedema IHC: HER2 0 ----------------------------------------------------------------------------------------------------------------------- Current treatment: Palliative treatment with Sacituzumab-Govitecan, today cycle 13   Toxicities: 1.  Neutropenia: We reduced the dosage of Trodelvy with cycle 2 2. Fatigue and joint stiffness 3.  Chemotherapy-induced anemia today's hemoglobin is improving and today it is 11 4.  Nausea: Takes nausea medication 5. Constipation alternating with diarrhea 6.  Dryness of the face and acne   Upper respiratory infection with sinusitis: Sent a prescription for azithromycin.     CT CAP 09/16/2021: Slight interval decrease in the size of the enlarged pretracheal, subcarinal, right hilar lymph nodes.  Numerous small bilateral lung nodules unchanged.  Numerous lesions in the liver poorly visualized.  Unchanged bone metastases. CT CAP 12/31/2021: No evidence of progression.  Stable pleural nodules, stable subcarinal lymph node, stable bone metastasis, hepatic metastases unchanged.     Return to clinic for Trodelvy and follow-ups We plan to do CT scans on 04/14/2022.

## 2022-03-31 NOTE — Progress Notes (Signed)
Patient Care Team: Kelton Pillar, MD as PCP - General (Family Medicine) Jacelyn Pi, MD as Consulting Physician (Endocrinology) Jovita Kussmaul, MD as Consulting Physician (General Surgery) [name], MD as Attending Physician (Radiation Oncology) Belva Crome, MD as Consulting Physician (Cardiology) [name] [name], NP as Nurse Practitioner (Hematology and Oncology) Lajoyce Lauber, DMD (Dentistry) Raina Mina, RPH-CPP (Pharmacist) Nicholas Lose, MD as Consulting Physician (Hematology and Oncology)  DIAGNOSIS: No diagnosis found.  SUMMARY OF ONCOLOGIC HISTORY: Oncology History  Malignant neoplasm of upper-inner quadrant of right breast in female, estrogen receptor positive ([place])  10/15/2015 Initial Biopsy   Right breast upper inner quadrant biopsy: IDC, DCIS, grade 2, ER+(90%), PR+(50%), Ki67 70%, HER-2 negative (ratio 1.18).   10/17/2015 Initial Diagnosis   Malignant neoplasm of upper-inner quadrant of right breast in female, estrogen receptor positive ([place])   10/2015 -  Anti-estrogen oral therapy   Tamoxifen daily   11/28/2015 Surgery   Left breast mammoplasty: IDC, grade 1, 1.2cm, DCIS, lobular neoplasia, T1c, Nx, ER 80%, PR60%, Ki67 3% HER-2 neg Right breast lumpectomy: IDC, grade 1, 2.3cm, DCIS. T2N1a ER/PR+HER-2 neg, Ki 70%.   11/28/2015 Miscellaneous   Mammaprint was Low Risk. Predicting a 98% chance of no disease recurrence within 5 years with anti-estrogens as the only systemic therapy.  It also predicts minimal to no benefit from chemotherapy.     12/26/2015 Surgery   Bilateral mastectomies: bilateral sentinel node sampling, Right: no residual carcinoma, left, focal DCIS, 4 SLN negative   02/11/2016 - 03/27/2016 Radiation Therapy   Adjuvant radiation [name]): 1) Right chest wall: 50.4 Gy in 28 fractions  2) Right chest wall boost: 10 Gy in 5 fractions   08/04/2017 Relapse/Recurrence   08/04/2017, MRI of the liver and total spine MRI 08/11/2017  showed liver and bone metastasis; liver biopsy 09/07/2017 confirms estrogen receptor positive, progesterone receptor negative, HER-2 negative metastatic breast cancer; chest CT scan 08/13/2017 shows multiple bilateral pulmonary nodules.   08/17/2017 - 01/04/2018 Anti-estrogen oral therapy   fulvestrant  palbociclib started 08/31/2017 discontinued 01/04/2018 with evidence of progression in the liver   08/2017 Miscellaneous   [facility] report on liver biopsy from April 2019 shows a negative PDL 1, stable MSI and proficient mismatch repair status.  The tumor is positive for the androgen receptor, and for PTEN for PIK3 CA mutations.  HER-2/neu was read as equivocal (it was negative by FISH on the pathology report).   01/04/2018 Miscellaneous   anastrozole started 01/04/2018 everolimus started 01/29/2018   02/16/2018 - 03/01/2018 Radiation Therapy   01/28/18 prophylactic left femur intramedullary nail surgery for impending left femur pathologic fracture alliative radiation with Dr. [name] to the left femur and left ilium from 02/16/2018 to 03/01/2018   01/30/2020 - 05/2020 Anti-estrogen oral therapy   alpelisib started 01/30/2020 at 300 mg daily with Metformin 500 mg daily and Faslodex (stopped for progression)   05/22/2020 - 08/2020 Chemotherapy   Xeloda discontinued for progression   09/10/2020 - 06/18/2021 Chemotherapy   Patient is on Treatment Plan : BREAST Paclitaxel D1,D15 q28d     07/17/2021 - 12/18/2021 Chemotherapy   Patient is on Treatment Plan : BREAST METASTATIC Sacituzumab govitecan-hziy [name]) q21d     01/01/2022 -  Chemotherapy   Patient is on Treatment Plan : BREAST METASTATIC Sacituzumab govitecan-hziy [name]) D1,8 q21d     Bilateral malignant neoplasm of breast in female, estrogen receptor positive ([place])  12/26/2015 Initial Diagnosis   Bilateral malignant neoplasm of breast in female, estrogen receptor positive (  [place])   07/17/2021 - 12/18/2021 Chemotherapy   Patient is on Treatment Plan  : BREAST METASTATIC Sacituzumab govitecan-hziy [name]) q21d     01/01/2022 -  Chemotherapy   Patient is on Treatment Plan : BREAST METASTATIC Sacituzumab govitecan-hziy [name]) D1,8 q21d     Malignant neoplasm metastatic to liver ([place])  08/13/2017 Initial Diagnosis   Liver metastases ([place])   07/17/2021 - 12/18/2021 Chemotherapy   Patient is on Treatment Plan : BREAST METASTATIC Sacituzumab govitecan-hziy [name]) q21d     01/01/2022 -  Chemotherapy   Patient is on Treatment Plan : BREAST METASTATIC Sacituzumab govitecan-hziy [name]) D1,8 q21d     Malignant neoplasm metastatic to lung ([place])  09/10/2020 Initial Diagnosis   Lung metastases ([place])   07/17/2021 - 12/18/2021 Chemotherapy   Patient is on Treatment Plan : BREAST METASTATIC Sacituzumab govitecan-hziy [name]) q21d     01/01/2022 -  Chemotherapy   Patient is on Treatment Plan : BREAST METASTATIC Sacituzumab govitecan-hziy [name]) D1,8 q21d       CHIEF COMPLIANT: Follow-up metastatic breast cancer cycle 13 Trodelvy  INTERVAL HISTORY: Debbie [name] is a  56 year old with above-mentioned history of metastatic breast cancer currently on cycle 13 trodelvy. She presents to the clinic for a follow-up.     ALLERGIES:  is allergic to ace inhibitors, atorvastatin, simvastatin, and percocet [oxycodone-acetaminophen].  MEDICATIONS:  Current Outpatient Medications  Medication Sig Dispense Refill   acetaminophen (TYLENOL) 500 MG tablet Take 1,000 mg by mouth every 6 (six) hours as needed.     Calcium-Magnesium 250-125 MG TABS Take 1 tablet by mouth daily. 120 each 4   cetirizine (ZYRTEC) 10 MG tablet Take 10 mg by mouth daily as needed for allergies.      chlorhexidine (PERIDEX) 0.12 % solution Use 5 mLs in the mouth or throat 2 (two) times daily as needed as directed. ****Do not swallow**** 473 mL 6   diphenoxylate-atropine (LOMOTIL) 2.5-0.025 MG tablet Take 1 tablet by mouth 4 (four) times daily as needed for diarrhea or  loose stools. 30 tablet 2   flurbiprofen (ANSAID) 100 MG tablet Take by mouth.     furosemide (LASIX) 20 MG tablet Take 1 tablet (20 mg total) by mouth daily. 90 tablet 6   glucose blood (FREESTYLE LITE) test strip Use 1 strip to check blood sugar 3 (three) times daily. 50 strip 3   levothyroxine (SYNTHROID) 175 MCG tablet Take 1 tablet (175 mcg total) by mouth in the morning on an empty stomach 90 tablet 1   losartan-hydrochlorothiazide (HYZAAR) 100-25 MG tablet Take 1 tablet by mouth daily. 30 tablet 11   losartan-hydrochlorothiazide (HYZAAR) 100-25 MG tablet Take 1 tablet by mouth daily. 30 tablet 11   metFORMIN (GLUCOPHAGE-XR) 500 MG 24 hr tablet Take 1 tablet by mouth  with evening meal 90 tablet 1   Multiple Vitamin (MULTIVITAMIN) tablet Take 1 tablet by mouth daily.     omega-3 acid ethyl esters (LOVAZA) 1 g capsule Take 1 g by mouth 2 (two) times daily.      omeprazole (PRILOSEC) 40 MG capsule Take 1 capsule (40 mg total) by mouth daily. 30 capsule 3   ondansetron (ZOFRAN-ODT) 8 MG disintegrating tablet Dissolve 1 tablet (8 mg total) by mouth every 8 (eight) hours as needed for nausea or vomiting. 20 tablet 0   oxyCODONE (OXY IR/ROXICODONE) 5 MG immediate release tablet Take 1 tablet (5 mg total) by mouth every 4 (four) hours as needed for severe pain. 60 tablet 0   potassium  chloride (KLOR-CON) 10 MEQ tablet Take 2 tablets (20 mEq total) by mouth daily. 60 tablet 1   pregabalin (LYRICA) 100 MG capsule Take 1 capsule by mouth 3 times daily. 90 capsule 3   rosuvastatin (CRESTOR) 5 MG tablet Take 1 tablet (5 mg total) by mouth daily. 90 tablet 1   sertraline (ZOLOFT) 100 MG tablet TAKE 1 TABLET BY MOUTH ONCE DAILY 30 tablet 12   Vitamin D, Cholecalciferol, 1000 units CAPS Take 1 tablet by mouth daily. (Patient taking differently: Take 1,000 Units by mouth daily.) 90 capsule 4   No current facility-administered medications for this visit.    PHYSICAL EXAMINATION: ECOG PERFORMANCE STATUS:  {CHL ONC ECOG PS:[phone]}  There were no vitals filed for this visit. There were no vitals filed for this visit.  BREAST:*** No palpable masses or nodules in either right or left breasts. No palpable axillary supraclavicular or infraclavicular adenopathy no breast tenderness or nipple discharge. (exam performed in the presence of a chaperone)  LABORATORY DATA:  I have reviewed the data as listed    Latest Ref Rng & Units 03/26/2022    7:51 AM 03/12/2022    7:48 AM 03/05/2022    7:22 AM  CMP  Glucose 70 - 99 mg/dL 105  115  123   BUN 6 - 20 mg/dL _0 Creatinine 0.44 - 1.00 mg/dL 0.72  0.88  0.76   Sodium 135 - 145 mmol/L 142  140  141   Potassium 3.5 - 5.1 mmol/L 3.5  3.5  3.2   Chloride 98 - 111 mmol/L 104  102  102   CO2 22 - 32 mmol/L _1 Calcium 8.9 - 10.3 mg/dL 9.3  9.4  9.2   Total Protein 6.5 - 8.1 g/dL 7.1  7.1  6.9   Total Bilirubin 0.3 - 1.2 mg/dL 0.3  0.4  0.3   Alkaline Phos 38 - 126 U/L 217  197  215   AST 15 - 41 U/L 39  40  36   ALT 0 - 44 U/L 46  46  45     Lab Results  Component Value Date   WBC 4.2 03/26/2022   HGB 11.2 (L) 03/26/2022   HCT 34.3 (L) 03/26/2022   MCV 93.5 03/26/2022   PLT 238 03/26/2022   NEUTROABS 3.1 03/26/2022    ASSESSMENT & PLAN:  No problem-specific Assessment & Plan notes found for this encounter.    No orders of the defined types were placed in this encounter.  The patient has a good understanding of the overall plan. she agrees with it. she will call with any problems that may develop before the next visit here. Total time spent: 30 mins including face to face time and time spent for planning, charting and co-ordination of care   Suzzette Righter, [place] 03/31/22    I Gardiner Coins am scribing for Dr. Lindi Adie  ***

## 2022-03-31 NOTE — Progress Notes (Signed)
Patient did not show for nutrition appointment. 

## 2022-04-01 ENCOUNTER — Inpatient Hospital Stay: Payer: 59

## 2022-04-01 ENCOUNTER — Inpatient Hospital Stay (HOSPITAL_BASED_OUTPATIENT_CLINIC_OR_DEPARTMENT_OTHER): Payer: 59 | Admitting: Hematology and Oncology

## 2022-04-01 ENCOUNTER — Ambulatory Visit: Payer: 59 | Admitting: Hematology and Oncology

## 2022-04-01 ENCOUNTER — Ambulatory Visit: Payer: 59

## 2022-04-01 ENCOUNTER — Other Ambulatory Visit: Payer: 59

## 2022-04-01 VITALS — BP 161/84 | HR 102 | Temp 97.8°F | Resp 18 | Ht 66.0 in | Wt 295.0 lb

## 2022-04-01 VITALS — BP 142/64 | HR 85 | Resp 18

## 2022-04-01 DIAGNOSIS — C787 Secondary malignant neoplasm of liver and intrahepatic bile duct: Secondary | ICD-10-CM

## 2022-04-01 DIAGNOSIS — C50211 Malignant neoplasm of upper-inner quadrant of right female breast: Secondary | ICD-10-CM

## 2022-04-01 DIAGNOSIS — Z170 Estrogen receptor positive status [ER+]: Secondary | ICD-10-CM

## 2022-04-01 DIAGNOSIS — C50111 Malignant neoplasm of central portion of right female breast: Secondary | ICD-10-CM

## 2022-04-01 DIAGNOSIS — Z5111 Encounter for antineoplastic chemotherapy: Secondary | ICD-10-CM | POA: Diagnosis not present

## 2022-04-01 DIAGNOSIS — Z923 Personal history of irradiation: Secondary | ICD-10-CM | POA: Diagnosis not present

## 2022-04-01 DIAGNOSIS — C7800 Secondary malignant neoplasm of unspecified lung: Secondary | ICD-10-CM

## 2022-04-01 DIAGNOSIS — Z95828 Presence of other vascular implants and grafts: Secondary | ICD-10-CM

## 2022-04-01 DIAGNOSIS — C50112 Malignant neoplasm of central portion of left female breast: Secondary | ICD-10-CM

## 2022-04-01 DIAGNOSIS — C7951 Secondary malignant neoplasm of bone: Secondary | ICD-10-CM | POA: Diagnosis not present

## 2022-04-01 DIAGNOSIS — Z9013 Acquired absence of bilateral breasts and nipples: Secondary | ICD-10-CM | POA: Diagnosis not present

## 2022-04-01 LAB — CBC WITH DIFFERENTIAL (CANCER CENTER ONLY)
Abs Immature Granulocytes: 0.03 10*3/uL (ref 0.00–0.07)
Basophils Absolute: 0 10*3/uL (ref 0.0–0.1)
Basophils Relative: 1 %
Eosinophils Absolute: 0.1 10*3/uL (ref 0.0–0.5)
Eosinophils Relative: 2 %
HCT: 32.8 % — ABNORMAL LOW (ref 36.0–46.0)
Hemoglobin: 10.5 g/dL — ABNORMAL LOW (ref 12.0–15.0)
Immature Granulocytes: 1 %
Lymphocytes Relative: 14 %
Lymphs Abs: 0.5 10*3/uL — ABNORMAL LOW (ref 0.7–4.0)
MCH: 30 pg (ref 26.0–34.0)
MCHC: 32 g/dL (ref 30.0–36.0)
MCV: 93.7 fL (ref 80.0–100.0)
Monocytes Absolute: 0.4 10*3/uL (ref 0.1–1.0)
Monocytes Relative: 10 %
Neutro Abs: 2.7 10*3/uL (ref 1.7–7.7)
Neutrophils Relative %: 72 %
Platelet Count: 219 10*3/uL (ref 150–400)
RBC: 3.5 MIL/uL — ABNORMAL LOW (ref 3.87–5.11)
RDW: 17.6 % — ABNORMAL HIGH (ref 11.5–15.5)
WBC Count: 3.7 10*3/uL — ABNORMAL LOW (ref 4.0–10.5)
nRBC: 0 % (ref 0.0–0.2)

## 2022-04-01 LAB — CMP (CANCER CENTER ONLY)
ALT: 41 U/L (ref 0–44)
AST: 35 U/L (ref 15–41)
Albumin: 3.6 g/dL (ref 3.5–5.0)
Alkaline Phosphatase: 203 U/L — ABNORMAL HIGH (ref 38–126)
Anion gap: 5 (ref 5–15)
BUN: 10 mg/dL (ref 6–20)
CO2: 32 mmol/L (ref 22–32)
Calcium: 9 mg/dL (ref 8.9–10.3)
Chloride: 103 mmol/L (ref 98–111)
Creatinine: 0.65 mg/dL (ref 0.44–1.00)
GFR, Estimated: >60 mL/min (ref >60)
Glucose, Bld: 128 mg/dL — ABNORMAL HIGH (ref 70–99)
Potassium: 3.4 mmol/L — ABNORMAL LOW (ref 3.5–5.1)
Sodium: 140 mmol/L (ref 135–145)
Total Bilirubin: 0.3 mg/dL (ref 0.3–1.2)
Total Protein: 6.6 g/dL (ref 6.5–8.1)

## 2022-04-01 LAB — PHOSPHORUS: Phosphorus: 2.7 mg/dL (ref 2.5–4.6)

## 2022-04-01 LAB — MAGNESIUM: Magnesium: 1.9 mg/dL (ref 1.7–2.4)

## 2022-04-01 MED ORDER — DIPHENHYDRAMINE HCL 50 MG/ML IJ SOLN
50.0000 mg | Freq: Once | INTRAMUSCULAR | Status: AC
  Administered 2022-04-01: 50 mg via INTRAVENOUS
  Filled 2022-04-01: qty 1

## 2022-04-01 MED ORDER — PALONOSETRON HCL INJECTION 0.25 MG/5ML
0.2500 mg | Freq: Once | INTRAVENOUS | Status: AC
  Administered 2022-04-01: 0.25 mg via INTRAVENOUS
  Filled 2022-04-01: qty 5

## 2022-04-01 MED ORDER — SODIUM CHLORIDE 0.9 % IV SOLN
150.0000 mg | Freq: Once | INTRAVENOUS | Status: AC
  Administered 2022-04-01: 150 mg via INTRAVENOUS
  Filled 2022-04-01: qty 150

## 2022-04-01 MED ORDER — HEPARIN SOD (PORK) LOCK FLUSH 100 UNIT/ML IV SOLN
500.0000 [IU] | Freq: Once | INTRAVENOUS | Status: AC | PRN
  Administered 2022-04-01: 500 [IU]

## 2022-04-01 MED ORDER — SODIUM CHLORIDE 0.9% FLUSH
10.0000 mL | INTRAVENOUS | Status: DC | PRN
  Administered 2022-04-01: 10 mL

## 2022-04-01 MED ORDER — SODIUM CHLORIDE 0.9 % IV SOLN
10.0000 mg | Freq: Once | INTRAVENOUS | Status: AC
  Administered 2022-04-01: 10 mg via INTRAVENOUS
  Filled 2022-04-01: qty 10

## 2022-04-01 MED ORDER — ACETAMINOPHEN 325 MG PO TABS
650.0000 mg | ORAL_TABLET | Freq: Once | ORAL | Status: AC
  Administered 2022-04-01: 650 mg via ORAL
  Filled 2022-04-01: qty 2

## 2022-04-01 MED ORDER — SODIUM CHLORIDE 0.9% FLUSH
10.0000 mL | Freq: Once | INTRAVENOUS | Status: AC
  Administered 2022-04-01: 10 mL

## 2022-04-01 MED ORDER — FAMOTIDINE IN NACL 20-0.9 MG/50ML-% IV SOLN
20.0000 mg | Freq: Once | INTRAVENOUS | Status: AC
  Administered 2022-04-01: 20 mg via INTRAVENOUS
  Filled 2022-04-01: qty 50

## 2022-04-01 MED ORDER — SODIUM CHLORIDE 0.9 % IV SOLN
6.0000 mg/kg | Freq: Once | INTRAVENOUS | Status: AC
  Administered 2022-04-01: 790 mg via INTRAVENOUS
  Filled 2022-04-01: qty 79

## 2022-04-01 MED ORDER — SODIUM CHLORIDE 0.9 % IV SOLN: Freq: Once | INTRAVENOUS | Status: AC

## 2022-04-01 MED ORDER — ATROPINE SULFATE 1 MG/ML IV SOLN
0.5000 mg | Freq: Once | INTRAVENOUS | Status: DC | PRN
  Filled 2022-04-01: qty 1

## 2022-04-01 NOTE — Patient Instructions (Signed)
[facility] ONCOL[facility]  Discharge Instructions: Thank you for choosing [facility] to provide your oncology and hematology care.   If you have a lab appointment with the [facility], please go directly to the [facility] and check in at the registration area.   Wear comfortable clothing and clothing appropriate for easy access to any Portacath or PICC line.   We strive to give you quality time with your provider. You may need to reschedule your appointment if you arrive late (15 or more minutes).  Arriving late affects you and other patients whose appointments are after yours.  Also, if you miss three or more appointments without notifying the office, you may be dismissed from the clinic at the provider's discretion.      For prescription refill requests, have your pharmacy contact our office and allow 72 hours for refills to be completed.    Today you received the following chemotherapy and/or immunotherapy agent: [name]   To help prevent nausea and vomiting after your treatment, we encourage you to take your nausea medication as directed.  BELOW ARE SYMPTOMS THAT SHOULD BE REPORTED IMMEDIATELY: *FEVER GREATER THAN 100.4 F (38 C) OR HIGHER *CHILLS OR SWEATING *NAUSEA AND VOMITING THAT IS NOT CONTROLLED WITH YOUR NAUSEA MEDICATION *UNUSUAL SHORTNESS OF BREATH *UNUSUAL BRUISING OR BLEEDING *URINARY PROBLEMS (pain or burning when urinating, or frequent urination) *BOWEL PROBLEMS (unusual diarrhea, constipation, pain near the anus) TENDERNESS IN MOUTH AND THROAT WITH OR WITHOUT PRESENCE OF ULCERS (sore throat, sores in mouth, or a toothache) UNUSUAL RASH, SWELLING OR PAIN  UNUSUAL VAGINAL DISCHARGE OR ITCHING   Items with * indicate a potential emergency and should be followed up as soon as possible or go to the Emergency Department if any problems should occur.  Please show the CHEMOTHERAPY ALERT CARD or IMMUNOTHERAPY ALERT CARD at check-in to the  Emergency Department and triage nurse.  Should you have questions after your visit or need to cancel or reschedule your appointment, please contact [facility]  Dept: [phone]  and follow the prompts.  Office hours are 8:00 a.m. to 4:30 p.m. Monday - Friday. Please note that voicemails left after 4:00 p.m. may not be returned until the following business day.  We are closed weekends and major holidays. You have access to a nurse at all times for urgent questions. Please call the main number to the clinic Dept: [phone] and follow the prompts.   For any non-urgent questions, you may also contact your provider using MyChart. We now offer e-Visits for anyone 40 and older to request care online for non-urgent symptoms. For details visit [email or web address].   Also download the MyChart app! Go to the app store, search "MyChart", open the app, select [facility], and log in with your MyChart username and password.  Masks are optional in the cancer centers. If you would like for your care team to wear a mask while they are taking care of you, please let them know. You may have one support person who is at least 58 years old accompany you for your appointments. Sacituzumab Govitecan Injection What is this medication? SACITUZUMAB GOVITECAN (SAK i TOOZ ue mab GOE vi TEE kan) treats breast cancer. It may also be used to treat bladder cancer and kidney cancer. It works by blocking a protein that causes cancer cells to grow and multiply. This helps to slow or stop the spread of cancer cells. This medicine may be used for other  purposes; ask your health care provider or pharmacist if you have questions. COMMON BRAND NAME(S): TRODELVY What should I tell my care team before I take this medication? They need to know if you have any of these conditions: Carry the UGT1A1*28 gene Infection Liver disease An unusual or allergic reaction to sacituzumab govitecan, other  medications, foods, dyes, or preservatives Pregnant or trying to get pregnant Breast-feeding How should I use this medication? This medication is injected into a vein. It is given by your care team in a hospital or clinic setting. Talk to your care team about the use of this medication in children. Special care may be needed. Overdosage: If you think you have taken too much of this medicine contact a poison control center or emergency room at once. NOTE: This medicine is only for you. Do not share this medicine with others. What if I miss a dose? Keep appointments for follow-up doses. It is important not to miss your dose. Call your care team if you are unable to keep an appointment. What may interact with this medication? This medication may affect how other medications work, and other medications may affect the way this medication works. Talk with your care team about all of the medications you take. They may suggest changes to your treatment plan to lower the risk of side effects and to make sure your medications work as intended. This list may not describe all possible interactions. Give your health care provider a list of all the medicines, herbs, non-prescription drugs, or dietary supplements you use. Also tell them if you smoke, drink alcohol, or use illegal drugs. Some items may interact with your medicine. What should I watch for while using this medication? This medication may make you feel generally unwell. This is not uncommon as chemotherapy can affect healthy cells as well as cancer cells. Report any side effects. Continue your course of treatment even though you feel ill unless your care team tells you to stop. You may need blood work while you are taking this medication. Certain genetic factors may decrease the safety of this medication. Your care team may use genetic tests to determine treatment. This medication can cause serious allergic reactions. To reduce your risk, your care  team may give you other medications to take before receiving this one. Be sure to follow the directions from your care team. Check with your care team if you have severe diarrhea, nausea, and vomiting, or if you sweat a lot. The loss of too much body fluid may make it dangerous for you to take this medication. Talk to your care team if you wish to become pregnant or think you might be pregnant. This medication can cause serious birth defects if taken during pregnancy or if you get pregnant within 6 months after stopping treatment. A negative pregnancy test is required before starting this medication. A reliable form of contraception is recommended while taking this medication and for 6 months after stopping treatment. Talk to your care team about reliable forms of contraception. Use a condom during sex and for 3 months after stopping treatment. Tell your care team right away if you think your partner might be pregnant. This medication can cause serious birth defects. Do not breast-feed while taking this medication and for 1 month after stopping therapy. This medication may cause infertility. Talk to your care team if you are concerned about your fertility. This medication may increase your risk of getting an infection. Call your care team for advice if  you get a fever, chills, sore throat, or other symptoms of a cold or flu. Do not treat yourself. Try to avoid being around people who are sick. Avoid taking medications that contain aspirin, acetaminophen, ibuprofen, naproxen, or ketoprofen unless instructed by your care team. These medications may hide a fever. This medication may increase blood sugar. The risk may be higher in patients who already have diabetes. Ask your care team what you can do to lower your risk of diabetes while taking this medication. What side effects may I notice from receiving this medication? Side effects that you should report to your care team as soon as possible: Allergic  reactions--skin rash, itching, hives, swelling of the face, lips, tongue, or throat Infection--fever, chills, cough, or sore throat Infusion reactions--chest pain, shortness of breath or trouble breathing, feeling faint or lightheaded Low red blood cell level--unusual weakness or fatigue, dizziness, headache, trouble breathing Severe or prolonged diarrhea Side effects that usually do not require medical attention (report these to your care team if they continue or are bothersome): Constipation Diarrhea Fatigue Hair loss Loss of appetite Nausea Vomiting This list may not describe all possible side effects. Call your doctor for medical advice about side effects. You may report side effects to FDA at 1-800-FDA-1088. Where should I keep my medication? This medication is given in a hospital or clinic. It will not be stored at home. NOTE: This sheet is a summary. It may not cover all possible information. If you have questions about this medicine, talk to your doctor, pharmacist, or health care provider.  2023 Elsevier/Gold Standard (2021-06-26 00:00:00) Denosumab Injection (Oncology) What is this medication? DENOSUMAB (den oh SUE mab) prevents weakened bones caused by cancer. It may also be used to treat noncancerous bone tumors that cannot be removed by surgery. It can also be used to treat high calcium levels in the blood caused by cancer. It works by blocking a protein that causes bones to break down quickly. This slows down the release of calcium from bones, which lowers calcium levels in your blood. It also makes your bones stronger and less likely to break (fracture). This medicine may be used for other purposes; ask your health care provider or pharmacist if you have questions. COMMON BRAND NAME(S): XGEVA What should I tell my care team before I take this medication? They need to know if you have any of these conditions: Dental disease Having surgery or tooth  extraction Infection Kidney disease Low levels of calcium or vitamin D in the blood Malnutrition On hemodialysis Skin conditions or sensitivity Thyroid or parathyroid disease An unusual reaction to denosumab, other medications, foods, dyes, or preservatives Pregnant or trying to get pregnant Breast-feeding How should I use this medication? This medication is for injection under the skin. It is given by your care team in a hospital or clinic setting. A special MedGuide will be given to you before each treatment. Be sure to read this information carefully each time. Talk to your care team about the use of this medication in children. While it may be prescribed for children as young as 13 years for selected conditions, precautions do apply. Overdosage: If you think you have taken too much of this medicine contact a poison control center or emergency room at once. NOTE: This medicine is only for you. Do not share this medicine with others. What if I miss a dose? Keep appointments for follow-up doses. It is important not to miss your dose. Call your care team if you  are unable to keep an appointment. What may interact with this medication? Do not take this medication with any of the following: Other medications containing denosumab This medication may also interact with the following: Medications that lower your chance of fighting infection Steroid medications, such as prednisone or cortisone This list may not describe all possible interactions. Give your health care provider a list of all the medicines, herbs, non-prescription drugs, or dietary supplements you use. Also tell them if you smoke, drink alcohol, or use illegal drugs. Some items may interact with your medicine. What should I watch for while using this medication? Your condition will be monitored carefully while you are receiving this medication. You may need blood work while taking this medication. This medication may increase  your risk of getting an infection. Call your care team for advice if you get a fever, chills, sore throat, or other symptoms of a cold or flu. Do not treat yourself. Try to avoid being around people who are sick. You should make sure you get enough calcium and vitamin D while you are taking this medication, unless your care team tells you not to. Discuss the foods you eat and the vitamins you take with your care team. Some people who take this medication have severe bone, joint, or muscle pain. This medication may also increase your risk for jaw problems or a broken thigh bone. Tell your care team right away if you have severe pain in your jaw, bones, joints, or muscles. Tell your care team if you have any pain that does not go away or that gets worse. Talk to your care team if you may be pregnant. Serious birth defects can occur if you take this medication during pregnancy and for 5 months after the last dose. You will need a negative pregnancy test before starting this medication. Contraception is recommended while taking this medication and for 5 months after the last dose. Your care team can help you find the option that works for you. What side effects may I notice from receiving this medication? Side effects that you should report to your care team as soon as possible: Allergic reactions--skin rash, itching, hives, swelling of the face, lips, tongue, or throat Bone, joint, or muscle pain Low calcium level--muscle pain or cramps, confusion, tingling, or numbness in the hands or feet Osteonecrosis of the jaw--pain, swelling, or redness in the mouth, numbness of the jaw, poor healing after dental work, unusual discharge from the mouth, visible bones in the mouth Side effects that usually do not require medical attention (report to your care team if they continue or are bothersome): Cough Diarrhea Fatigue Headache Nausea This list may not describe all possible side effects. Call your doctor for  medical advice about side effects. You may report side effects to FDA at 1-800-FDA-1088. Where should I keep my medication? This medication is given in a hospital or clinic. It will not be stored at home. NOTE: This sheet is a summary. It may not cover all possible information. If you have questions about this medicine, talk to your doctor, pharmacist, or health care provider.  2023 Elsevier/Gold Standard (2021-09-15 00:00:00)

## 2022-04-02 DIAGNOSIS — C50911 Malignant neoplasm of unspecified site of right female breast: Secondary | ICD-10-CM | POA: Diagnosis not present

## 2022-04-02 DIAGNOSIS — I890 Lymphedema, not elsewhere classified: Secondary | ICD-10-CM | POA: Diagnosis not present

## 2022-04-02 LAB — CANCER ANTIGEN 27.29: CA 27.29: 138.5 U/mL — ABNORMAL HIGH (ref 0.0–38.6)

## 2022-04-07 DIAGNOSIS — H524 Presbyopia: Secondary | ICD-10-CM | POA: Diagnosis not present

## 2022-04-08 ENCOUNTER — Other Ambulatory Visit (HOSPITAL_COMMUNITY): Payer: Self-pay

## 2022-04-08 ENCOUNTER — Other Ambulatory Visit: Payer: Self-pay | Admitting: Hematology and Oncology

## 2022-04-08 MED ORDER — OMEPRAZOLE 40 MG PO CPDR
40.0000 mg | DELAYED_RELEASE_CAPSULE | Freq: Every day | ORAL | 3 refills | Status: DC
  Filled 2022-04-08: qty 30, 30d supply, fill #0
  Filled 2022-05-05: qty 30, 30d supply, fill #1
  Filled 2022-06-04: qty 30, 30d supply, fill #2
  Filled 2022-07-06: qty 30, 30d supply, fill #3

## 2022-04-10 ENCOUNTER — Telehealth: Payer: Self-pay | Admitting: Hematology and Oncology

## 2022-04-10 NOTE — Telephone Encounter (Signed)
Rescheduled appointment per provider PAL. Patient is aware of the changes made to her upcoming appointments. 

## 2022-04-11 ENCOUNTER — Other Ambulatory Visit: Payer: Self-pay

## 2022-04-11 NOTE — Progress Notes (Signed)
Patient Care Team: Kelton Pillar, MD as PCP - General (Family Medicine) Jacelyn Pi, MD as Consulting Physician (Endocrinology) Jovita Kussmaul, MD as Consulting Physician (General Surgery) [name], MD as Attending Physician (Radiation Oncology) Belva Crome, MD as Consulting Physician (Cardiology) [name] [name], NP as Nurse Practitioner (Hematology and Oncology) Lajoyce Lauber, DMD (Dentistry) Raina Mina, RPH-CPP (Pharmacist) Nicholas Lose, MD as Consulting Physician (Hematology and Oncology)  DIAGNOSIS: No diagnosis found.  SUMMARY OF ONCOLOGIC HISTORY: Oncology History  Malignant neoplasm of upper-inner quadrant of right breast in female, estrogen receptor positive ([place])  10/15/2015 Initial Biopsy   Right breast upper inner quadrant biopsy: IDC, DCIS, grade 2, ER+(90%), PR+(50%), Ki67 70%, HER-2 negative (ratio 1.18).   10/17/2015 Initial Diagnosis   Malignant neoplasm of upper-inner quadrant of right breast in female, estrogen receptor positive ([place])   10/2015 -  Anti-estrogen oral therapy   Tamoxifen daily   11/28/2015 Surgery   Left breast mammoplasty: IDC, grade 1, 1.2cm, DCIS, lobular neoplasia, T1c, Nx, ER 80%, PR60%, Ki67 3% HER-2 neg Right breast lumpectomy: IDC, grade 1, 2.3cm, DCIS. T2N1a ER/PR+HER-2 neg, Ki 70%.   11/28/2015 Miscellaneous   Mammaprint was Low Risk. Predicting a 98% chance of no disease recurrence within 5 years with anti-estrogens as the only systemic therapy.  It also predicts minimal to no benefit from chemotherapy.     12/26/2015 Surgery   Bilateral mastectomies: bilateral sentinel node sampling, Right: no residual carcinoma, left, focal DCIS, 4 SLN negative   02/11/2016 - 03/27/2016 Radiation Therapy   Adjuvant radiation [name]): 1) Right chest wall: 50.4 Gy in 28 fractions  2) Right chest wall boost: 10 Gy in 5 fractions   08/04/2017 Relapse/Recurrence   08/04/2017, MRI of the liver and total spine MRI 08/11/2017  showed liver and bone metastasis; liver biopsy 09/07/2017 confirms estrogen receptor positive, progesterone receptor negative, HER-2 negative metastatic breast cancer; chest CT scan 08/13/2017 shows multiple bilateral pulmonary nodules.   08/17/2017 - 01/04/2018 Anti-estrogen oral therapy   fulvestrant  palbociclib started 08/31/2017 discontinued 01/04/2018 with evidence of progression in the liver   08/2017 Miscellaneous   [facility] report on liver biopsy from April 2019 shows a negative PDL 1, stable MSI and proficient mismatch repair status.  The tumor is positive for the androgen receptor, and for PTEN for PIK3 CA mutations.  HER-2/neu was read as equivocal (it was negative by FISH on the pathology report).   01/04/2018 Miscellaneous   anastrozole started 01/04/2018 everolimus started 01/29/2018   02/16/2018 - 03/01/2018 Radiation Therapy   01/28/18 prophylactic left femur intramedullary nail surgery for impending left femur pathologic fracture alliative radiation with Dr. [name] to the left femur and left ilium from 02/16/2018 to 03/01/2018   01/30/2020 - 05/2020 Anti-estrogen oral therapy   alpelisib started 01/30/2020 at 300 mg daily with Metformin 500 mg daily and Faslodex (stopped for progression)   05/22/2020 - 08/2020 Chemotherapy   Xeloda discontinued for progression   09/10/2020 - 06/18/2021 Chemotherapy   Patient is on Treatment Plan : BREAST Paclitaxel D1,D15 q28d     07/17/2021 - 12/18/2021 Chemotherapy   Patient is on Treatment Plan : BREAST METASTATIC Sacituzumab govitecan-hziy [name]) q21d     01/01/2022 -  Chemotherapy   Patient is on Treatment Plan : BREAST METASTATIC Sacituzumab govitecan-hziy [name]) D1,8 q21d     Bilateral malignant neoplasm of breast in female, estrogen receptor positive ([place])  12/26/2015 Initial Diagnosis   Bilateral malignant neoplasm of breast in female, estrogen receptor positive (  [place])   07/17/2021 - 12/18/2021 Chemotherapy   Patient is on Treatment Plan  : BREAST METASTATIC Sacituzumab govitecan-hziy [name]) q21d     01/01/2022 -  Chemotherapy   Patient is on Treatment Plan : BREAST METASTATIC Sacituzumab govitecan-hziy [name]) D1,8 q21d     Malignant neoplasm metastatic to liver ([place])  08/13/2017 Initial Diagnosis   Liver metastases ([place])   07/17/2021 - 12/18/2021 Chemotherapy   Patient is on Treatment Plan : BREAST METASTATIC Sacituzumab govitecan-hziy [name]) q21d     01/01/2022 -  Chemotherapy   Patient is on Treatment Plan : BREAST METASTATIC Sacituzumab govitecan-hziy [name]) D1,8 q21d     Malignant neoplasm metastatic to lung ([place])  09/10/2020 Initial Diagnosis   Lung metastases ([name])   07/17/2021 - 12/18/2021 Chemotherapy   Patient is on Treatment Plan : BREAST METASTATIC Sacituzumab govitecan-hziy [name]) q21d     01/01/2022 -  Chemotherapy   Patient is on Treatment Plan : BREAST METASTATIC Sacituzumab govitecan-hziy [name]) D1,8 q21d       CHIEF COMPLIANT: Follow-up metastatic breast cancer cycle 13 Trodelvy   INTERVAL HISTORY: Debbie [name] is a 56 year old with above-mentioned history of metastatic breast cancer currently on cycle 13 trodelvy. She presents to the clinic for a follow-up.    ALLERGIES:  is allergic to ace inhibitors, atorvastatin, simvastatin, and percocet [oxycodone-acetaminophen].  MEDICATIONS:  Current Outpatient Medications  Medication Sig Dispense Refill   acetaminophen (TYLENOL) 500 MG tablet Take 1,000 mg by mouth every 6 (six) hours as needed.     Calcium-Magnesium 250-125 MG TABS Take 1 tablet by mouth daily. 120 each 4   cetirizine (ZYRTEC) 10 MG tablet Take 10 mg by mouth daily as needed for allergies.      chlorhexidine (PERIDEX) 0.12 % solution Use 5 mLs in the mouth or throat 2 (two) times daily as needed as directed. ****Do not swallow**** 473 mL 6   diphenoxylate-atropine (LOMOTIL) 2.5-0.025 MG tablet Take 1 tablet by mouth 4 (four) times daily as needed for diarrhea or  loose stools. 30 tablet 2   flurbiprofen (ANSAID) 100 MG tablet Take by mouth.     furosemide (LASIX) 20 MG tablet Take 1 tablet (20 mg total) by mouth daily. 90 tablet 6   glucose blood (FREESTYLE LITE) test strip Use 1 strip to check blood sugar 3 (three) times daily. 50 strip 3   levothyroxine (SYNTHROID) 175 MCG tablet Take 1 tablet (175 mcg total) by mouth in the morning on an empty stomach 90 tablet 1   losartan-hydrochlorothiazide (HYZAAR) 100-25 MG tablet Take 1 tablet by mouth daily. 30 tablet 11   losartan-hydrochlorothiazide (HYZAAR) 100-25 MG tablet Take 1 tablet by mouth daily. 30 tablet 11   metFORMIN (GLUCOPHAGE-XR) 500 MG 24 hr tablet Take 1 tablet by mouth  with evening meal 90 tablet 1   Multiple Vitamin (MULTIVITAMIN) tablet Take 1 tablet by mouth daily.     omega-3 acid ethyl esters (LOVAZA) 1 g capsule Take 1 g by mouth 2 (two) times daily.      omeprazole (PRILOSEC) 40 MG capsule Take 1 capsule (40 mg total) by mouth daily. 30 capsule 3   ondansetron (ZOFRAN-ODT) 8 MG disintegrating tablet Dissolve 1 tablet (8 mg total) by mouth every 8 (eight) hours as needed for nausea or vomiting. 20 tablet 0   oxyCODONE (OXY IR/ROXICODONE) 5 MG immediate release tablet Take 1 tablet (5 mg total) by mouth every 4 (four) hours as needed for severe pain. 60 tablet 0   potassium chloride (  KLOR-CON) 10 MEQ tablet Take 2 tablets (20 mEq total) by mouth daily. 60 tablet 1   pregabalin (LYRICA) 100 MG capsule Take 1 capsule by mouth 3 times daily. 90 capsule 3   rosuvastatin (CRESTOR) 5 MG tablet Take 1 tablet (5 mg total) by mouth daily. 90 tablet 1   sertraline (ZOLOFT) 100 MG tablet TAKE 1 TABLET BY MOUTH ONCE DAILY 30 tablet 12   Vitamin D, Cholecalciferol, 1000 units CAPS Take 1 tablet by mouth daily. (Patient taking differently: Take 1,000 Units by mouth daily.) 90 capsule 4   No current facility-administered medications for this visit.    PHYSICAL EXAMINATION: ECOG PERFORMANCE STATUS:  {CHL ONC ECOG PS:[id number]}  There were no vitals filed for this visit. There were no vitals filed for this visit.  BREAST:*** No palpable masses or nodules in either right or left breasts. No palpable axillary supraclavicular or infraclavicular adenopathy no breast tenderness or nipple discharge. (exam performed in the presence of a chaperone)  LABORATORY DATA:  I have reviewed the data as listed    Latest Ref Rng & Units 04/01/2022    7:44 AM 03/26/2022    7:51 AM 03/12/2022    7:48 AM  CMP  Glucose 70 - 99 mg/dL 128  105  115   BUN 6 - 20 mg/dL _0 Creatinine 0.44 - 1.00 mg/dL 0.65  0.72  0.88   Sodium 135 - 145 mmol/L 140  142  140   Potassium 3.5 - 5.1 mmol/L 3.4  3.5  3.5   Chloride 98 - 111 mmol/L 103  104  102   CO2 22 - 32 mmol/L 32  31  31   Calcium 8.9 - 10.3 mg/dL 9.0  9.3  9.4   Total Protein 6.5 - 8.1 g/dL 6.6  7.1  7.1   Total Bilirubin 0.3 - 1.2 mg/dL 0.3  0.3  0.4   Alkaline Phos 38 - 126 U/L 203  217  197   AST 15 - 41 U/L 35  39  40   ALT 0 - 44 U/L 41  46  46     Lab Results  Component Value Date   WBC 3.7 (L) 04/01/2022   HGB 10.5 (L) 04/01/2022   HCT 32.8 (L) 04/01/2022   MCV 93.7 04/01/2022   PLT 219 04/01/2022   NEUTROABS 2.7 04/01/2022    ASSESSMENT & PLAN:  No problem-specific Assessment & Plan notes found for this encounter.    No orders of the defined types were placed in this encounter.  The patient has a good understanding of the overall plan. she agrees with it. she will call with any problems that may develop before the next visit here. Total time spent: 30 mins including face to face time and time spent for planning, charting and co-ordination of care   Suzzette Righter, [place] 04/11/22    I Gardiner Coins am scribing for Dr. Lindi Adie  ***

## 2022-04-14 ENCOUNTER — Ambulatory Visit (HOSPITAL_COMMUNITY)
Admission: RE | Admit: 2022-04-14 | Discharge: 2022-04-14 | Disposition: A | Discharge status: Home / Self Care | Payer: 59 | Source: Ambulatory Visit | Attending: Hematology and Oncology | Admitting: Hematology and Oncology

## 2022-04-14 ENCOUNTER — Encounter (HOSPITAL_COMMUNITY): Payer: Self-pay

## 2022-04-14 DIAGNOSIS — M545 Low back pain, unspecified: Secondary | ICD-10-CM | POA: Diagnosis not present

## 2022-04-14 DIAGNOSIS — C787 Secondary malignant neoplasm of liver and intrahepatic bile duct: Secondary | ICD-10-CM | POA: Insufficient documentation

## 2022-04-14 DIAGNOSIS — C7800 Secondary malignant neoplasm of unspecified lung: Secondary | ICD-10-CM | POA: Insufficient documentation

## 2022-04-14 DIAGNOSIS — C50211 Malignant neoplasm of upper-inner quadrant of right female breast: Secondary | ICD-10-CM | POA: Diagnosis not present

## 2022-04-14 DIAGNOSIS — K5730 Diverticulosis of large intestine without perforation or abscess without bleeding: Secondary | ICD-10-CM | POA: Diagnosis not present

## 2022-04-14 DIAGNOSIS — K7460 Unspecified cirrhosis of liver: Secondary | ICD-10-CM | POA: Diagnosis not present

## 2022-04-14 DIAGNOSIS — C7951 Secondary malignant neoplasm of bone: Secondary | ICD-10-CM | POA: Diagnosis not present

## 2022-04-14 DIAGNOSIS — Z170 Estrogen receptor positive status [ER+]: Secondary | ICD-10-CM | POA: Insufficient documentation

## 2022-04-14 DIAGNOSIS — C50112 Malignant neoplasm of central portion of left female breast: Secondary | ICD-10-CM | POA: Insufficient documentation

## 2022-04-14 DIAGNOSIS — R918 Other nonspecific abnormal finding of lung field: Secondary | ICD-10-CM | POA: Diagnosis not present

## 2022-04-14 DIAGNOSIS — C50111 Malignant neoplasm of central portion of right female breast: Secondary | ICD-10-CM | POA: Diagnosis not present

## 2022-04-14 DIAGNOSIS — I700 Atherosclerosis of aorta: Secondary | ICD-10-CM | POA: Diagnosis not present

## 2022-04-14 DIAGNOSIS — K7581 Nonalcoholic steatohepatitis (NASH): Secondary | ICD-10-CM | POA: Diagnosis not present

## 2022-04-14 MED ORDER — HEPARIN SOD (PORK) LOCK FLUSH 100 UNIT/ML IV SOLN
INTRAVENOUS | Status: AC
  Filled 2022-04-14: qty 5

## 2022-04-14 MED ORDER — SODIUM CHLORIDE (PF) 0.9 % IJ SOLN
INTRAMUSCULAR | Status: AC
  Filled 2022-04-14: qty 50

## 2022-04-14 MED ORDER — IOHEXOL 300 MG/ML  SOLN
100.0000 mL | Freq: Once | INTRAMUSCULAR | Status: AC | PRN
  Administered 2022-04-14: 100 mL via INTRAVENOUS

## 2022-04-14 MED ORDER — HEPARIN SOD (PORK) LOCK FLUSH 100 UNIT/ML IV SOLN
500.0000 [IU] | Freq: Once | INTRAVENOUS | Status: AC
  Administered 2022-04-14: 500 [IU] via INTRAVENOUS

## 2022-04-15 ENCOUNTER — Other Ambulatory Visit (HOSPITAL_COMMUNITY): Payer: Self-pay

## 2022-04-15 MED ORDER — AMOXICILLIN-POT CLAVULANATE 875-125 MG PO TABS
1.0000 | ORAL_TABLET | Freq: Two times a day (BID) | ORAL | 0 refills | Status: DC
  Filled 2022-04-15: qty 14, 7d supply, fill #0

## 2022-04-15 MED ORDER — CHLORHEXIDINE GLUCONATE 0.12 % MT SOLN
15.0000 mL | Freq: Two times a day (BID) | OROMUCOSAL | 0 refills | Status: DC
  Filled 2022-04-15: qty 473, 16d supply, fill #0

## 2022-04-15 MED FILL — Fosaprepitant Dimeglumine For IV Infusion 150 MG (Base Eq): INTRAVENOUS | Qty: 5 | Status: AC

## 2022-04-15 MED FILL — Dexamethasone Sodium Phosphate Inj 100 MG/10ML: INTRAMUSCULAR | Qty: 1 | Status: AC

## 2022-04-16 ENCOUNTER — Other Ambulatory Visit (HOSPITAL_COMMUNITY): Payer: Self-pay

## 2022-04-16 ENCOUNTER — Inpatient Hospital Stay: Payer: 59

## 2022-04-16 ENCOUNTER — Other Ambulatory Visit: Payer: Self-pay | Admitting: *Deleted

## 2022-04-16 ENCOUNTER — Encounter: Payer: 59 | Admitting: Nutrition

## 2022-04-16 ENCOUNTER — Inpatient Hospital Stay: Payer: 59 | Attending: Oncology

## 2022-04-16 ENCOUNTER — Inpatient Hospital Stay (HOSPITAL_BASED_OUTPATIENT_CLINIC_OR_DEPARTMENT_OTHER): Payer: 59 | Admitting: Hematology and Oncology

## 2022-04-16 VITALS — BP 186/87 | HR 97 | Temp 97.8°F | Resp 18 | Ht 66.0 in | Wt 295.0 lb

## 2022-04-16 DIAGNOSIS — Z79899 Other long term (current) drug therapy: Secondary | ICD-10-CM | POA: Insufficient documentation

## 2022-04-16 DIAGNOSIS — Z923 Personal history of irradiation: Secondary | ICD-10-CM | POA: Diagnosis not present

## 2022-04-16 DIAGNOSIS — Z170 Estrogen receptor positive status [ER+]: Secondary | ICD-10-CM

## 2022-04-16 DIAGNOSIS — C7800 Secondary malignant neoplasm of unspecified lung: Secondary | ICD-10-CM | POA: Insufficient documentation

## 2022-04-16 DIAGNOSIS — C787 Secondary malignant neoplasm of liver and intrahepatic bile duct: Secondary | ICD-10-CM | POA: Insufficient documentation

## 2022-04-16 DIAGNOSIS — Z9013 Acquired absence of bilateral breasts and nipples: Secondary | ICD-10-CM | POA: Diagnosis not present

## 2022-04-16 DIAGNOSIS — C50211 Malignant neoplasm of upper-inner quadrant of right female breast: Secondary | ICD-10-CM | POA: Diagnosis present

## 2022-04-16 DIAGNOSIS — C50111 Malignant neoplasm of central portion of right female breast: Secondary | ICD-10-CM | POA: Diagnosis not present

## 2022-04-16 DIAGNOSIS — C7951 Secondary malignant neoplasm of bone: Secondary | ICD-10-CM | POA: Diagnosis present

## 2022-04-16 DIAGNOSIS — Z79810 Long term (current) use of selective estrogen receptor modulators (SERMs): Secondary | ICD-10-CM | POA: Insufficient documentation

## 2022-04-16 DIAGNOSIS — C50112 Malignant neoplasm of central portion of left female breast: Secondary | ICD-10-CM

## 2022-04-16 DIAGNOSIS — Z9221 Personal history of antineoplastic chemotherapy: Secondary | ICD-10-CM | POA: Diagnosis not present

## 2022-04-16 DIAGNOSIS — Z95828 Presence of other vascular implants and grafts: Secondary | ICD-10-CM

## 2022-04-16 DIAGNOSIS — J019 Acute sinusitis, unspecified: Secondary | ICD-10-CM | POA: Insufficient documentation

## 2022-04-16 DIAGNOSIS — Z5112 Encounter for antineoplastic immunotherapy: Secondary | ICD-10-CM | POA: Insufficient documentation

## 2022-04-16 LAB — CMP (CANCER CENTER ONLY)
ALT: 54 U/L — ABNORMAL HIGH (ref 0–44)
AST: 42 U/L — ABNORMAL HIGH (ref 15–41)
Albumin: 3.5 g/dL (ref 3.5–5.0)
Alkaline Phosphatase: 219 U/L — ABNORMAL HIGH (ref 38–126)
Anion gap: 6 (ref 5–15)
BUN: 10 mg/dL (ref 6–20)
CO2: 32 mmol/L (ref 22–32)
Calcium: 9.5 mg/dL (ref 8.9–10.3)
Chloride: 103 mmol/L (ref 98–111)
Creatinine: 0.65 mg/dL (ref 0.44–1.00)
GFR, Estimated: >60 mL/min (ref >60)
Glucose, Bld: 139 mg/dL — ABNORMAL HIGH (ref 70–99)
Potassium: 3.3 mmol/L — ABNORMAL LOW (ref 3.5–5.1)
Sodium: 141 mmol/L (ref 135–145)
Total Bilirubin: 0.5 mg/dL (ref 0.3–1.2)
Total Protein: 6.7 g/dL (ref 6.5–8.1)

## 2022-04-16 LAB — CBC WITH DIFFERENTIAL (CANCER CENTER ONLY)
Abs Immature Granulocytes: 0.01 10*3/uL (ref 0.00–0.07)
Basophils Absolute: 0 10*3/uL (ref 0.0–0.1)
Basophils Relative: 1 %
Eosinophils Absolute: 0.1 10*3/uL (ref 0.0–0.5)
Eosinophils Relative: 2 %
HCT: 34.1 % — ABNORMAL LOW (ref 36.0–46.0)
Hemoglobin: 11.1 g/dL — ABNORMAL LOW (ref 12.0–15.0)
Immature Granulocytes: 0 %
Lymphocytes Relative: 11 %
Lymphs Abs: 0.5 10*3/uL — ABNORMAL LOW (ref 0.7–4.0)
MCH: 30 pg (ref 26.0–34.0)
MCHC: 32.6 g/dL (ref 30.0–36.0)
MCV: 92.2 fL (ref 80.0–100.0)
Monocytes Absolute: 0.5 10*3/uL (ref 0.1–1.0)
Monocytes Relative: 10 %
Neutro Abs: 3.5 10*3/uL (ref 1.7–7.7)
Neutrophils Relative %: 76 %
Platelet Count: 224 10*3/uL (ref 150–400)
RBC: 3.7 MIL/uL — ABNORMAL LOW (ref 3.87–5.11)
RDW: 18.1 % — ABNORMAL HIGH (ref 11.5–15.5)
WBC Count: 4.6 10*3/uL (ref 4.0–10.5)
nRBC: 0 % (ref 0.0–0.2)

## 2022-04-16 LAB — MAGNESIUM: Magnesium: 1.8 mg/dL (ref 1.7–2.4)

## 2022-04-16 LAB — PHOSPHORUS: Phosphorus: 3.8 mg/dL (ref 2.5–4.6)

## 2022-04-16 MED ORDER — SODIUM CHLORIDE 0.9% FLUSH
10.0000 mL | Freq: Once | INTRAVENOUS | Status: AC
  Administered 2022-04-16: 10 mL

## 2022-04-16 MED ORDER — ACETAMINOPHEN 325 MG PO TABS
650.0000 mg | ORAL_TABLET | Freq: Once | ORAL | Status: AC
  Administered 2022-04-16: 650 mg via ORAL
  Filled 2022-04-16: qty 2

## 2022-04-16 MED ORDER — SODIUM CHLORIDE 0.9 % IV SOLN
6.0000 mg/kg | Freq: Once | INTRAVENOUS | Status: AC
  Administered 2022-04-16: 790 mg via INTRAVENOUS
  Filled 2022-04-16: qty 79

## 2022-04-16 MED ORDER — ACETAMINOPHEN 325 MG PO TABS: 325.0000 mg | ORAL_TABLET | Freq: Once | ORAL | Status: DC

## 2022-04-16 MED ORDER — HEPARIN SOD (PORK) LOCK FLUSH 100 UNIT/ML IV SOLN
500.0000 [IU] | Freq: Once | INTRAVENOUS | Status: AC | PRN
  Administered 2022-04-16: 500 [IU]

## 2022-04-16 MED ORDER — OXYCODONE HCL 5 MG PO TABS
5.0000 mg | ORAL_TABLET | Freq: Once | ORAL | Status: AC
  Administered 2022-04-16: 5 mg via ORAL
  Filled 2022-04-16: qty 1

## 2022-04-16 MED ORDER — OXYCODONE-ACETAMINOPHEN 5-325 MG PO TABS
1.0000 | ORAL_TABLET | Freq: Three times a day (TID) | ORAL | 0 refills | Status: DC | PRN
  Filled 2022-04-16: qty 20, 7d supply, fill #0

## 2022-04-16 MED ORDER — SODIUM CHLORIDE 0.9 % IV SOLN
150.0000 mg | Freq: Once | INTRAVENOUS | Status: AC
  Administered 2022-04-16: 150 mg via INTRAVENOUS
  Filled 2022-04-16: qty 150

## 2022-04-16 MED ORDER — OXYCODONE HCL 5 MG PO TABS: 5.0000 mg | ORAL_TABLET | Freq: Once | ORAL | Status: DC

## 2022-04-16 MED ORDER — SODIUM CHLORIDE 0.9 % IV SOLN
10.0000 mg | Freq: Once | INTRAVENOUS | Status: AC
  Administered 2022-04-16: 10 mg via INTRAVENOUS
  Filled 2022-04-16: qty 10

## 2022-04-16 MED ORDER — FAMOTIDINE IN NACL 20-0.9 MG/50ML-% IV SOLN
20.0000 mg | Freq: Once | INTRAVENOUS | Status: AC
  Administered 2022-04-16: 20 mg via INTRAVENOUS
  Filled 2022-04-16: qty 50

## 2022-04-16 MED ORDER — PALONOSETRON HCL INJECTION 0.25 MG/5ML
0.2500 mg | Freq: Once | INTRAVENOUS | Status: AC
  Administered 2022-04-16: 0.25 mg via INTRAVENOUS
  Filled 2022-04-16: qty 5

## 2022-04-16 MED ORDER — SODIUM CHLORIDE 0.9% FLUSH
10.0000 mL | INTRAVENOUS | Status: DC | PRN
  Administered 2022-04-16: 10 mL

## 2022-04-16 MED ORDER — OXYCODONE-ACETAMINOPHEN 5-325 MG PO TABS: 1.0000 | ORAL_TABLET | Freq: Once | ORAL | Status: DC

## 2022-04-16 MED ORDER — DIPHENHYDRAMINE HCL 50 MG/ML IJ SOLN
50.0000 mg | Freq: Once | INTRAMUSCULAR | Status: AC
  Administered 2022-04-16: 50 mg via INTRAVENOUS
  Filled 2022-04-16: qty 1

## 2022-04-16 MED ORDER — SODIUM CHLORIDE 0.9 % IV SOLN: Freq: Once | INTRAVENOUS | Status: AC

## 2022-04-16 NOTE — Addendum Note (Signed)
Addended by: Tora Kindred on: 04/16/2022 09:34 AM   Modules accepted: Orders

## 2022-04-16 NOTE — Progress Notes (Signed)
Per Dr Lindi Adie,   ok to treat with elevated BP 186/84 today

## 2022-04-16 NOTE — Assessment & Plan Note (Signed)
Metastatic Breast Cancer: ER Pos and Her 2 Neg (previously progressed on Ibrance, fulvestrant, everolimus, alpelisib, Xeloda) Prior Treatment: Taxol 09/10/2020-06/18/21 Chronic right arm lymphedema IHC: HER2 0 ----------------------------------------------------------------------------------------------------------------------- Current treatment: Palliative treatment with Sacituzumab-Govitecan, today cycle 14   Toxicities: 1.  Neutropenia: We reduced the dosage of Trodelvy with cycle 2 2. Fatigue and joint stiffness 3.  Chemotherapy-induced anemia today's hemoglobin is improving and today it is 10.5 4.  Nausea: Takes nausea medication 5. Constipation alternating with diarrhea 6.  Dryness of the face and acne   Upper respiratory infection with sinusitis: Sent a prescription for azithromycin.     CT CAP 09/16/2021: Slight interval decrease in the size of the enlarged pretracheal, subcarinal, right hilar lymph nodes.  Numerous small bilateral lung nodules unchanged.  Numerous lesions in the liver poorly visualized.  Unchanged bone metastases. CT CAP 12/31/2021: No evidence of progression.  Stable pleural nodules, stable subcarinal lymph node, stable bone metastasis, hepatic metastases unchanged. CT CAP 04/14/2022: Similar subpleural pulmonary nodules.  Decrease in mediastinal lymph node, stable bone metastases, regression/resolution of the central liver lesion, cirrhosis and hepatic steatosis   Radiology review: Excellent response to [place].  Continue with the same treatment.  Return to clinic for [facility] and follow-ups

## 2022-04-17 LAB — CANCER ANTIGEN 27.29: CA 27.29: 153.3 U/mL — ABNORMAL HIGH (ref 0.0–38.6)

## 2022-04-21 ENCOUNTER — Other Ambulatory Visit: Payer: Self-pay

## 2022-04-22 ENCOUNTER — Other Ambulatory Visit (HOSPITAL_COMMUNITY): Payer: Self-pay

## 2022-04-22 MED FILL — Dexamethasone Sodium Phosphate Inj 100 MG/10ML: INTRAMUSCULAR | Qty: 1 | Status: AC

## 2022-04-22 MED FILL — Fosaprepitant Dimeglumine For IV Infusion 150 MG (Base Eq): INTRAVENOUS | Qty: 5 | Status: AC

## 2022-04-22 NOTE — Progress Notes (Signed)
Patient Care Team: Kelton Pillar, MD as PCP - General (Family Medicine) Jacelyn Pi, MD as Consulting Physician (Endocrinology) Jovita Kussmaul, MD as Consulting Physician (General Surgery) [name], MD as Attending Physician (Radiation Oncology) Belva Crome, MD as Consulting Physician (Cardiology) [name] [name], NP as Nurse Practitioner (Hematology and Oncology) Lajoyce Lauber, DMD (Dentistry) Raina Mina, RPH-CPP (Pharmacist) Nicholas Lose, MD as Consulting Physician (Hematology and Oncology)  DIAGNOSIS: No diagnosis found.  SUMMARY OF ONCOLOGIC HISTORY: Oncology History  Malignant neoplasm of upper-inner quadrant of right breast in female, estrogen receptor positive ([place])  10/15/2015 Initial Biopsy   Right breast upper inner quadrant biopsy: IDC, DCIS, grade 2, ER+(90%), PR+(50%), Ki67 70%, HER-2 negative (ratio 1.18).   10/17/2015 Initial Diagnosis   Malignant neoplasm of upper-inner quadrant of right breast in female, estrogen receptor positive ([place])   10/2015 -  Anti-estrogen oral therapy   Tamoxifen daily   11/28/2015 Surgery   Left breast mammoplasty: IDC, grade 1, 1.2cm, DCIS, lobular neoplasia, T1c, Nx, ER 80%, PR60%, Ki67 3% HER-2 neg Right breast lumpectomy: IDC, grade 1, 2.3cm, DCIS. T2N1a ER/PR+HER-2 neg, Ki 70%.   11/28/2015 Miscellaneous   Mammaprint was Low Risk. Predicting a 98% chance of no disease recurrence within 5 years with anti-estrogens as the only systemic therapy.  It also predicts minimal to no benefit from chemotherapy.     12/26/2015 Surgery   Bilateral mastectomies: bilateral sentinel node sampling, Right: no residual carcinoma, left, focal DCIS, 4 SLN negative   02/11/2016 - 03/27/2016 Radiation Therapy   Adjuvant radiation [name]): 1) Right chest wall: 50.4 Gy in 28 fractions  2) Right chest wall boost: 10 Gy in 5 fractions   08/04/2017 Relapse/Recurrence   08/04/2017, MRI of the liver and total spine MRI 08/11/2017  showed liver and bone metastasis; liver biopsy 09/07/2017 confirms estrogen receptor positive, progesterone receptor negative, HER-2 negative metastatic breast cancer; chest CT scan 08/13/2017 shows multiple bilateral pulmonary nodules.   08/17/2017 - 01/04/2018 Anti-estrogen oral therapy   fulvestrant  palbociclib started 08/31/2017 discontinued 01/04/2018 with evidence of progression in the liver   08/2017 Miscellaneous   [facility] report on liver biopsy from April 2019 shows a negative PDL 1, stable MSI and proficient mismatch repair status.  The tumor is positive for the androgen receptor, and for PTEN for PIK3 CA mutations.  HER-2/neu was read as equivocal (it was negative by FISH on the pathology report).   01/04/2018 Miscellaneous   anastrozole started 01/04/2018 everolimus started 01/29/2018   02/16/2018 - 03/01/2018 Radiation Therapy   01/28/18 prophylactic left femur intramedullary nail surgery for impending left femur pathologic fracture alliative radiation with Dr. [name] to the left femur and left ilium from 02/16/2018 to 03/01/2018   01/30/2020 - 05/2020 Anti-estrogen oral therapy   alpelisib started 01/30/2020 at 300 mg daily with Metformin 500 mg daily and Faslodex (stopped for progression)   05/22/2020 - 08/2020 Chemotherapy   Xeloda discontinued for progression   09/10/2020 - 06/18/2021 Chemotherapy   Patient is on Treatment Plan : BREAST Paclitaxel D1,D15 q28d     07/17/2021 - 12/18/2021 Chemotherapy   Patient is on Treatment Plan : BREAST METASTATIC Sacituzumab govitecan-hziy [name]) q21d     01/01/2022 -  Chemotherapy   Patient is on Treatment Plan : BREAST METASTATIC Sacituzumab govitecan-hziy [name]) D1,8 q21d     Bilateral malignant neoplasm of breast in female, estrogen receptor positive ([place])  12/26/2015 Initial Diagnosis   Bilateral malignant neoplasm of breast in female, estrogen receptor positive (  [place])   07/17/2021 - 12/18/2021 Chemotherapy   Patient is on Treatment Plan  : BREAST METASTATIC Sacituzumab govitecan-hziy [name]) q21d     01/01/2022 -  Chemotherapy   Patient is on Treatment Plan : BREAST METASTATIC Sacituzumab govitecan-hziy [name]) D1,8 q21d     Malignant neoplasm metastatic to liver ([place])  08/13/2017 Initial Diagnosis   Liver metastases ([place])   07/17/2021 - 12/18/2021 Chemotherapy   Patient is on Treatment Plan : BREAST METASTATIC Sacituzumab govitecan-hziy [name]) q21d     01/01/2022 -  Chemotherapy   Patient is on Treatment Plan : BREAST METASTATIC Sacituzumab govitecan-hziy [name]) D1,8 q21d     Malignant neoplasm metastatic to lung ([place])  09/10/2020 Initial Diagnosis   Lung metastases ([place])   07/17/2021 - 12/18/2021 Chemotherapy   Patient is on Treatment Plan : BREAST METASTATIC Sacituzumab govitecan-hziy [name]) q21d     01/01/2022 -  Chemotherapy   Patient is on Treatment Plan : BREAST METASTATIC Sacituzumab govitecan-hziy [name]) D1,8 q21d       CHIEF COMPLIANT: Follow-up metastatic breast cancer cycyle 15 Trodelvy   INTERVAL HISTORY: Debbie [name] is a 56 year old with above-mentioned history of metastatic breast cancer currently on cycle 15 trodelvy. She presents to the clinic for a follow-up.    ALLERGIES:  is allergic to ace inhibitors, atorvastatin, and simvastatin.  MEDICATIONS:  Current Outpatient Medications  Medication Sig Dispense Refill   acetaminophen (TYLENOL) 500 MG tablet Take 1,000 mg by mouth every 6 (six) hours as needed.     amoxicillin-clavulanate (AUGMENTIN) 875-125 MG tablet Take 1 tablet by mouth 2 (two) times daily. 14 tablet 0   Calcium-Magnesium 250-125 MG TABS Take 1 tablet by mouth daily. 120 each 4   cetirizine (ZYRTEC) 10 MG tablet Take 10 mg by mouth daily as needed for allergies.      chlorhexidine (PERIDEX) 0.12 % solution Use 5 mLs in the mouth or throat 2 (two) times daily as needed as directed. ****Do not swallow**** 473 mL 6   chlorhexidine (PERIDEX) 0.12 % solution Swish  using 71ms for 2 full minutes then spit. do this twice daily 473 mL 0   diphenoxylate-atropine (LOMOTIL) 2.5-0.025 MG tablet Take 1 tablet by mouth 4 (four) times daily as needed for diarrhea or loose stools. 30 tablet 2   flurbiprofen (ANSAID) 100 MG tablet Take by mouth.     furosemide (LASIX) 20 MG tablet Take 1 tablet (20 mg total) by mouth daily. 90 tablet 6   glucose blood (FREESTYLE LITE) test strip Use 1 strip to check blood sugar 3 (three) times daily. 50 strip 3   levothyroxine (SYNTHROID) 175 MCG tablet Take 1 tablet (175 mcg total) by mouth in the morning on an empty stomach 90 tablet 1   losartan-hydrochlorothiazide (HYZAAR) 100-25 MG tablet Take 1 tablet by mouth daily. 30 tablet 11   losartan-hydrochlorothiazide (HYZAAR) 100-25 MG tablet Take 1 tablet by mouth daily. 30 tablet 11   metFORMIN (GLUCOPHAGE-XR) 500 MG 24 hr tablet Take 1 tablet by mouth  with evening meal 90 tablet 1   Multiple Vitamin (MULTIVITAMIN) tablet Take 1 tablet by mouth daily.     omega-3 acid ethyl esters (LOVAZA) 1 g capsule Take 1 g by mouth 2 (two) times daily.      omeprazole (PRILOSEC) 40 MG capsule Take 1 capsule (40 mg total) by mouth daily. 30 capsule 3   ondansetron (ZOFRAN-ODT) 8 MG disintegrating tablet Dissolve 1 tablet (8 mg total) by mouth every 8 (eight) hours as needed for  nausea or vomiting. 20 tablet 0   oxyCODONE (OXY IR/ROXICODONE) 5 MG immediate release tablet Take 1 tablet (5 mg total) by mouth every 4 (four) hours as needed for severe pain. 60 tablet 0   oxyCODONE-acetaminophen (PERCOCET/ROXICET) 5-325 MG tablet Take 1 tablet by mouth every 8 (eight) hours as needed for severe pain. 20 tablet 0   potassium chloride (KLOR-CON) 10 MEQ tablet Take 2 tablets (20 mEq total) by mouth daily. 60 tablet 1   pregabalin (LYRICA) 100 MG capsule Take 1 capsule by mouth 3 times daily. 90 capsule 3   rosuvastatin (CRESTOR) 5 MG tablet Take 1 tablet (5 mg total) by mouth daily. 90 tablet 1   sertraline  (ZOLOFT) 100 MG tablet TAKE 1 TABLET BY MOUTH ONCE DAILY 30 tablet 12   Vitamin D, Cholecalciferol, 1000 units CAPS Take 1 tablet by mouth daily. (Patient taking differently: Take 1,000 Units by mouth daily.) 90 capsule 4   No current facility-administered medications for this visit.    PHYSICAL EXAMINATION: ECOG PERFORMANCE STATUS: {CHL ONC ECOG PS:[id number]}  There were no vitals filed for this visit. There were no vitals filed for this visit.  BREAST:*** No palpable masses or nodules in either right or left breasts. No palpable axillary supraclavicular or infraclavicular adenopathy no breast tenderness or nipple discharge. (exam performed in the presence of a chaperone)  LABORATORY DATA:  I have reviewed the data as listed    Latest Ref Rng & Units 04/16/2022    7:48 AM 04/01/2022    7:44 AM 03/26/2022    7:51 AM  CMP  Glucose 70 - 99 mg/dL 139  128  105   BUN 6 - 20 mg/dL _0 Creatinine 0.44 - 1.00 mg/dL 0.65  0.65  0.72   Sodium 135 - 145 mmol/L 141  140  142   Potassium 3.5 - 5.1 mmol/L 3.3  3.4  3.5   Chloride 98 - 111 mmol/L 103  103  104   CO2 22 - 32 mmol/L 32  32  31   Calcium 8.9 - 10.3 mg/dL 9.5  9.0  9.3   Total Protein 6.5 - 8.1 g/dL 6.7  6.6  7.1   Total Bilirubin 0.3 - 1.2 mg/dL 0.5  0.3  0.3   Alkaline Phos 38 - 126 U/L 219  203  217   AST 15 - 41 U/L 42  35  39   ALT 0 - 44 U/L 54  41  46     Lab Results  Component Value Date   WBC 4.6 04/16/2022   HGB 11.1 (L) 04/16/2022   HCT 34.1 (L) 04/16/2022   MCV 92.2 04/16/2022   PLT 224 04/16/2022   NEUTROABS 3.5 04/16/2022    ASSESSMENT & PLAN:  No problem-specific Assessment & Plan notes found for this encounter.    No orders of the defined types were placed in this encounter.  The patient has a good understanding of the overall plan. she agrees with it. she will call with any problems that may develop before the next visit here. Total time spent: 30 mins including face to face time and  time spent for planning, charting and co-ordination of care   Debbie [name], Debbie [name] 04/22/22    I Gardiner Coins am acting as a Education administrator for [employer]  ***

## 2022-04-23 ENCOUNTER — Other Ambulatory Visit: Payer: Self-pay

## 2022-04-23 ENCOUNTER — Inpatient Hospital Stay: Payer: 59

## 2022-04-23 ENCOUNTER — Other Ambulatory Visit (HOSPITAL_COMMUNITY): Payer: Self-pay

## 2022-04-23 ENCOUNTER — Inpatient Hospital Stay: Payer: 59 | Admitting: Dietician

## 2022-04-23 ENCOUNTER — Inpatient Hospital Stay (HOSPITAL_BASED_OUTPATIENT_CLINIC_OR_DEPARTMENT_OTHER): Payer: 59 | Admitting: Hematology and Oncology

## 2022-04-23 ENCOUNTER — Encounter: Payer: Self-pay | Admitting: Hematology and Oncology

## 2022-04-23 VITALS — HR 91

## 2022-04-23 VITALS — BP 159/77 | HR 101 | Temp 97.8°F | Resp 18 | Ht 66.0 in | Wt 292.9 lb

## 2022-04-23 DIAGNOSIS — C7800 Secondary malignant neoplasm of unspecified lung: Secondary | ICD-10-CM

## 2022-04-23 DIAGNOSIS — C50112 Malignant neoplasm of central portion of left female breast: Secondary | ICD-10-CM

## 2022-04-23 DIAGNOSIS — Z170 Estrogen receptor positive status [ER+]: Secondary | ICD-10-CM | POA: Diagnosis not present

## 2022-04-23 DIAGNOSIS — C787 Secondary malignant neoplasm of liver and intrahepatic bile duct: Secondary | ICD-10-CM

## 2022-04-23 DIAGNOSIS — Z9013 Acquired absence of bilateral breasts and nipples: Secondary | ICD-10-CM | POA: Diagnosis not present

## 2022-04-23 DIAGNOSIS — Z79810 Long term (current) use of selective estrogen receptor modulators (SERMs): Secondary | ICD-10-CM | POA: Diagnosis not present

## 2022-04-23 DIAGNOSIS — J019 Acute sinusitis, unspecified: Secondary | ICD-10-CM | POA: Diagnosis not present

## 2022-04-23 DIAGNOSIS — C50111 Malignant neoplasm of central portion of right female breast: Secondary | ICD-10-CM

## 2022-04-23 DIAGNOSIS — Z5112 Encounter for antineoplastic immunotherapy: Secondary | ICD-10-CM | POA: Diagnosis not present

## 2022-04-23 DIAGNOSIS — C50211 Malignant neoplasm of upper-inner quadrant of right female breast: Secondary | ICD-10-CM

## 2022-04-23 DIAGNOSIS — Z95828 Presence of other vascular implants and grafts: Secondary | ICD-10-CM

## 2022-04-23 DIAGNOSIS — C7951 Secondary malignant neoplasm of bone: Secondary | ICD-10-CM | POA: Diagnosis not present

## 2022-04-23 LAB — CBC WITH DIFFERENTIAL (CANCER CENTER ONLY)
Abs Immature Granulocytes: 0.04 10*3/uL (ref 0.00–0.07)
Basophils Absolute: 0 10*3/uL (ref 0.0–0.1)
Basophils Relative: 1 %
Eosinophils Absolute: 0.1 10*3/uL (ref 0.0–0.5)
Eosinophils Relative: 3 %
HCT: 33.9 % — ABNORMAL LOW (ref 36.0–46.0)
Hemoglobin: 11.1 g/dL — ABNORMAL LOW (ref 12.0–15.0)
Immature Granulocytes: 1 %
Lymphocytes Relative: 17 %
Lymphs Abs: 0.5 10*3/uL — ABNORMAL LOW (ref 0.7–4.0)
MCH: 30.2 pg (ref 26.0–34.0)
MCHC: 32.7 g/dL (ref 30.0–36.0)
MCV: 92.4 fL (ref 80.0–100.0)
Monocytes Absolute: 0.5 10*3/uL (ref 0.1–1.0)
Monocytes Relative: 15 %
Neutro Abs: 2 10*3/uL (ref 1.7–7.7)
Neutrophils Relative %: 63 %
Platelet Count: 247 10*3/uL (ref 150–400)
RBC: 3.67 MIL/uL — ABNORMAL LOW (ref 3.87–5.11)
RDW: 18.3 % — ABNORMAL HIGH (ref 11.5–15.5)
WBC Count: 3.2 10*3/uL — ABNORMAL LOW (ref 4.0–10.5)
nRBC: 0 % (ref 0.0–0.2)

## 2022-04-23 LAB — CMP (CANCER CENTER ONLY)
ALT: 46 U/L — ABNORMAL HIGH (ref 0–44)
AST: 39 U/L (ref 15–41)
Albumin: 3.6 g/dL (ref 3.5–5.0)
Alkaline Phosphatase: 234 U/L — ABNORMAL HIGH (ref 38–126)
Anion gap: 6 (ref 5–15)
BUN: 10 mg/dL (ref 6–20)
CO2: 33 mmol/L — ABNORMAL HIGH (ref 22–32)
Calcium: 9.4 mg/dL (ref 8.9–10.3)
Chloride: 103 mmol/L (ref 98–111)
Creatinine: 0.72 mg/dL (ref 0.44–1.00)
GFR, Estimated: >60 mL/min (ref >60)
Glucose, Bld: 117 mg/dL — ABNORMAL HIGH (ref 70–99)
Potassium: 3.7 mmol/L (ref 3.5–5.1)
Sodium: 142 mmol/L (ref 135–145)
Total Bilirubin: 0.3 mg/dL (ref 0.3–1.2)
Total Protein: 6.5 g/dL (ref 6.5–8.1)

## 2022-04-23 MED ORDER — PALONOSETRON HCL INJECTION 0.25 MG/5ML
0.2500 mg | Freq: Once | INTRAVENOUS | Status: AC
  Administered 2022-04-23: 0.25 mg via INTRAVENOUS
  Filled 2022-04-23: qty 5

## 2022-04-23 MED ORDER — DIPHENHYDRAMINE HCL 50 MG/ML IJ SOLN
50.0000 mg | Freq: Once | INTRAMUSCULAR | Status: AC
  Administered 2022-04-23: 50 mg via INTRAVENOUS
  Filled 2022-04-23: qty 1

## 2022-04-23 MED ORDER — ATROPINE SULFATE 1 MG/ML IV SOLN
0.5000 mg | Freq: Once | INTRAVENOUS | Status: AC | PRN
  Administered 2022-04-23: 0.5 mg via INTRAVENOUS
  Filled 2022-04-23: qty 1

## 2022-04-23 MED ORDER — SODIUM CHLORIDE 0.9 % IV SOLN
150.0000 mg | Freq: Once | INTRAVENOUS | Status: AC
  Administered 2022-04-23: 150 mg via INTRAVENOUS
  Filled 2022-04-23: qty 150

## 2022-04-23 MED ORDER — SODIUM CHLORIDE 0.9 % IV SOLN
6.0000 mg/kg | Freq: Once | INTRAVENOUS | Status: AC
  Administered 2022-04-23: 790 mg via INTRAVENOUS
  Filled 2022-04-23: qty 79

## 2022-04-23 MED ORDER — SODIUM CHLORIDE 0.9 % IV SOLN
10.0000 mg | Freq: Once | INTRAVENOUS | Status: AC
  Administered 2022-04-23: 10 mg via INTRAVENOUS
  Filled 2022-04-23: qty 10

## 2022-04-23 MED ORDER — HEPARIN SOD (PORK) LOCK FLUSH 100 UNIT/ML IV SOLN
500.0000 [IU] | Freq: Once | INTRAVENOUS | Status: AC | PRN
  Administered 2022-04-23: 500 [IU]

## 2022-04-23 MED ORDER — FAMOTIDINE IN NACL 20-0.9 MG/50ML-% IV SOLN
20.0000 mg | Freq: Once | INTRAVENOUS | Status: AC
  Administered 2022-04-23: 20 mg via INTRAVENOUS
  Filled 2022-04-23: qty 50

## 2022-04-23 MED ORDER — SODIUM CHLORIDE 0.9 % IV SOLN: Freq: Once | INTRAVENOUS | Status: AC

## 2022-04-23 MED ORDER — SODIUM CHLORIDE 0.9% FLUSH
10.0000 mL | INTRAVENOUS | Status: DC | PRN
  Administered 2022-04-23: 10 mL

## 2022-04-23 MED ORDER — ACETAMINOPHEN 325 MG PO TABS
650.0000 mg | ORAL_TABLET | Freq: Once | ORAL | Status: AC
  Administered 2022-04-23: 650 mg via ORAL
  Filled 2022-04-23: qty 2

## 2022-04-23 MED ORDER — SERTRALINE HCL 100 MG PO TABS
100.0000 mg | ORAL_TABLET | Freq: Every day | ORAL | 3 refills | Status: DC
  Filled 2022-04-23: qty 90, 90d supply, fill #0
  Filled 2022-07-29: qty 90, 90d supply, fill #1

## 2022-04-23 MED ORDER — SODIUM CHLORIDE 0.9% FLUSH
10.0000 mL | Freq: Once | INTRAVENOUS | Status: AC
  Administered 2022-04-23: 10 mL

## 2022-04-23 NOTE — Patient Instructions (Signed)
[facility]  Discharge Instructions: Thank you for choosing [facility] to provide your oncology and hematology care.   If you have a lab appointment with the [facility], please go directly to the [facility] and check in at the registration area.   Wear comfortable clothing and clothing appropriate for easy access to any Portacath or PICC line.   We strive to give you quality time with your provider. You may need to reschedule your appointment if you arrive late (15 or more minutes).  Arriving late affects you and other patients whose appointments are after yours.  Also, if you miss three or more appointments without notifying the office, you may be dismissed from the clinic at the provider's discretion.      For prescription refill requests, have your pharmacy contact our office and allow 72 hours for refills to be completed.    Today you received the following chemotherapy and/or immunotherapy agent: Debbie [name]   To help prevent nausea and vomiting after your treatment, we encourage you to take your nausea medication as directed.  BELOW ARE SYMPTOMS THAT SHOULD BE REPORTED IMMEDIATELY: *FEVER GREATER THAN 100.4 F (38 C) OR HIGHER *CHILLS OR SWEATING *NAUSEA AND VOMITING THAT IS NOT CONTROLLED WITH YOUR NAUSEA MEDICATION *UNUSUAL SHORTNESS OF BREATH *UNUSUAL BRUISING OR BLEEDING *URINARY PROBLEMS (pain or burning when urinating, or frequent urination) *BOWEL PROBLEMS (unusual diarrhea, constipation, pain near the anus) TENDERNESS IN MOUTH AND THROAT WITH OR WITHOUT PRESENCE OF ULCERS (sore throat, sores in mouth, or a toothache) UNUSUAL RASH, SWELLING OR PAIN  UNUSUAL VAGINAL DISCHARGE OR ITCHING   Items with * indicate a potential emergency and should be followed up as soon as possible or go to the Emergency Department if any problems should occur.  Please show the CHEMOTHERAPY ALERT CARD or IMMUNOTHERAPY ALERT CARD at check-in to the  Emergency Department and triage nurse.  Should you have questions after your visit or need to cancel or reschedule your appointment, please contact [facility]  Dept: [phone]  and follow the prompts.  Office hours are 8:00 a.m. to 4:30 p.m. Monday - Friday. Please note that voicemails left after 4:00 p.m. may not be returned until the following business day.  We are closed weekends and major holidays. You have access to a nurse at all times for urgent questions. Please call the main number to the clinic Dept: [phone] and follow the prompts.   For any non-urgent questions, you may also contact your provider using MyChart. We now offer e-Visits for anyone 58 and older to request care online for non-urgent symptoms. For details visit [email or web address].   Also download the MyChart app! Go to the app store, search "MyChart", open the app, select [facility], and log in with your MyChart username and password.  Masks are optional in the cancer centers. If you would like for your care team to wear a mask while they are taking care of you, please let them know. You may have one support Debbie [name] who is at least 58 years old accompany you for your appointments. Sacituzumab Govitecan Injection What is this medication? SACITUZUMAB GOVITECAN (SAK i TOOZ ue mab GOE vi TEE kan) treats breast cancer. It may also be used to treat bladder cancer and kidney cancer. It works by blocking a protein that causes cancer cells to grow and multiply. This helps to slow or stop the spread of cancer cells. This medicine may be used for other  purposes; ask your health care provider or pharmacist if you have questions. COMMON BRAND NAME(S): TRODELVY What should I tell my care team before I take this medication? They need to know if you have any of these conditions: Carry the UGT1A1*28 gene Infection Liver disease An unusual or allergic reaction to sacituzumab govitecan, other  medications, foods, dyes, or preservatives Pregnant or trying to get pregnant Breast-feeding How should I use this medication? This medication is injected into a vein. It is given by your care team in a hospital or clinic setting. Talk to your care team about the use of this medication in children. Special care may be needed. Overdosage: If you think you have taken too much of this medicine contact a poison control center or emergency room at once. NOTE: This medicine is only for you. Do not share this medicine with others. What if I miss a dose? Keep appointments for follow-up doses. It is important not to miss your dose. Call your care team if you are unable to keep an appointment. What may interact with this medication? This medication may affect how other medications work, and other medications may affect the way this medication works. Talk with your care team about all of the medications you take. They may suggest changes to your treatment plan to lower the risk of side effects and to make sure your medications work as intended. This list may not describe all possible interactions. Give your health care provider a list of all the medicines, herbs, non-prescription drugs, or dietary supplements you use. Also tell them if you smoke, drink alcohol, or use illegal drugs. Some items may interact with your medicine. What should I watch for while using this medication? This medication may make you feel generally unwell. This is not uncommon as chemotherapy can affect healthy cells as well as cancer cells. Report any side effects. Continue your course of treatment even though you feel ill unless your care team tells you to stop. You may need blood work while you are taking this medication. Certain genetic factors may decrease the safety of this medication. Your care team may use genetic tests to determine treatment. This medication can cause serious allergic reactions. To reduce your risk, your care  team may give you other medications to take before receiving this one. Be sure to follow the directions from your care team. Check with your care team if you have severe diarrhea, nausea, and vomiting, or if you sweat a lot. The loss of too much body fluid may make it dangerous for you to take this medication. Talk to your care team if you wish to become pregnant or think you might be pregnant. This medication can cause serious birth defects if taken during pregnancy or if you get pregnant within 6 months after stopping treatment. A negative pregnancy test is required before starting this medication. A reliable form of contraception is recommended while taking this medication and for 6 months after stopping treatment. Talk to your care team about reliable forms of contraception. Use a condom during sex and for 3 months after stopping treatment. Tell your care team right away if you think your partner might be pregnant. This medication can cause serious birth defects. Do not breast-feed while taking this medication and for 1 month after stopping therapy. This medication may cause infertility. Talk to your care team if you are concerned about your fertility. This medication may increase your risk of getting an infection. Call your care team for advice if  you get a fever, chills, sore throat, or other symptoms of a cold or flu. Do not treat yourself. Try to avoid being around people who are sick. Avoid taking medications that contain aspirin, acetaminophen, ibuprofen, naproxen, or ketoprofen unless instructed by your care team. These medications may hide a fever. This medication may increase blood sugar. The risk may be higher in patients who already have diabetes. Ask your care team what you can do to lower your risk of diabetes while taking this medication. What side effects may I notice from receiving this medication? Side effects that you should report to your care team as soon as possible: Allergic  reactions--skin rash, itching, hives, swelling of the face, lips, tongue, or throat Infection--fever, chills, cough, or sore throat Infusion reactions--chest pain, shortness of breath or trouble breathing, feeling faint or lightheaded Low red blood cell level--unusual weakness or fatigue, dizziness, headache, trouble breathing Severe or prolonged diarrhea Side effects that usually do not require medical attention (report these to your care team if they continue or are bothersome): Constipation Diarrhea Fatigue Hair loss Loss of appetite Nausea Vomiting This list may not describe all possible side effects. Call your doctor for medical advice about side effects. You may report side effects to FDA at 1-800-FDA-1088. Where should I keep my medication? This medication is given in a hospital or clinic. It will not be stored at home. NOTE: This sheet is a summary. It may not cover all possible information. If you have questions about this medicine, talk to your doctor, pharmacist, or health care provider.  2023 Elsevier/Gold Standard (2021-06-26 00:00:00) Denosumab Injection (Oncology) What is this medication? DENOSUMAB (den oh SUE mab) prevents weakened bones caused by cancer. It may also be used to treat noncancerous bone tumors that cannot be removed by surgery. It can also be used to treat high calcium levels in the blood caused by cancer. It works by blocking a protein that causes bones to break down quickly. This slows down the release of calcium from bones, which lowers calcium levels in your blood. It also makes your bones stronger and less likely to break (fracture). This medicine may be used for other purposes; ask your health care provider or pharmacist if you have questions. COMMON BRAND NAME(S): XGEVA What should I tell my care team before I take this medication? They need to know if you have any of these conditions: Dental disease Having surgery or tooth  extraction Infection Kidney disease Low levels of calcium or vitamin D in the blood Malnutrition On hemodialysis Skin conditions or sensitivity Thyroid or parathyroid disease An unusual reaction to denosumab, other medications, foods, dyes, or preservatives Pregnant or trying to get pregnant Breast-feeding How should I use this medication? This medication is for injection under the skin. It is given by your care team in a hospital or clinic setting. A special MedGuide will be given to you before each treatment. Be sure to read this information carefully each time. Talk to your care team about the use of this medication in children. While it may be prescribed for children as young as 13 years for selected conditions, precautions do apply. Overdosage: If you think you have taken too much of this medicine contact a poison control center or emergency room at once. NOTE: This medicine is only for you. Do not share this medicine with others. What if I miss a dose? Keep appointments for follow-up doses. It is important not to miss your dose. Call your care team if you  are unable to keep an appointment. What may interact with this medication? Do not take this medication with any of the following: Other medications containing denosumab This medication may also interact with the following: Medications that lower your chance of fighting infection Steroid medications, such as prednisone or cortisone This list may not describe all possible interactions. Give your health care provider a list of all the medicines, herbs, non-prescription drugs, or dietary supplements you use. Also tell them if you smoke, drink alcohol, or use illegal drugs. Some items may interact with your medicine. What should I watch for while using this medication? Your condition will be monitored carefully while you are receiving this medication. You may need blood work while taking this medication. This medication may increase  your risk of getting an infection. Call your care team for advice if you get a fever, chills, sore throat, or other symptoms of a cold or flu. Do not treat yourself. Try to avoid being around people who are sick. You should make sure you get enough calcium and vitamin D while you are taking this medication, unless your care team tells you not to. Discuss the foods you eat and the vitamins you take with your care team. Some people who take this medication have severe bone, joint, or muscle pain. This medication may also increase your risk for jaw problems or a broken thigh bone. Tell your care team right away if you have severe pain in your jaw, bones, joints, or muscles. Tell your care team if you have any pain that does not go away or that gets worse. Talk to your care team if you may be pregnant. Serious birth defects can occur if you take this medication during pregnancy and for 5 months after the last dose. You will need a negative pregnancy test before starting this medication. Contraception is recommended while taking this medication and for 5 months after the last dose. Your care team can help you find the option that works for you. What side effects may I notice from receiving this medication? Side effects that you should report to your care team as soon as possible: Allergic reactions--skin rash, itching, hives, swelling of the face, lips, tongue, or throat Bone, joint, or muscle pain Low calcium level--muscle pain or cramps, confusion, tingling, or numbness in the hands or feet Osteonecrosis of the jaw--pain, swelling, or redness in the mouth, numbness of the jaw, poor healing after dental work, unusual discharge from the mouth, visible bones in the mouth Side effects that usually do not require medical attention (report to your care team if they continue or are bothersome): Cough Diarrhea Fatigue Headache Nausea This list may not describe all possible side effects. Call your doctor for  medical advice about side effects. You may report side effects to FDA at 1-800-FDA-1088. Where should I keep my medication? This medication is given in a hospital or clinic. It will not be stored at home. NOTE: This sheet is a summary. It may not cover all possible information. If you have questions about this medicine, talk to your doctor, pharmacist, or health care provider.  2023 Elsevier/Gold Standard (2021-09-15 00:00:00)

## 2022-04-23 NOTE — Assessment & Plan Note (Addendum)
Metastatic Breast Cancer: ER Pos and Her 2 Neg (previously progressed on Ibrance, fulvestrant, everolimus, alpelisib, Xeloda) Prior Treatment: Taxol 09/10/2020-06/18/21 Chronic right arm lymphedema IHC: HER2 0 ----------------------------------------------------------------------------------------------------------------------- Current treatment: Palliative treatment with Sacituzumab-Govitecan, today cycle 15   Toxicities: 1.  Neutropenia: We reduced the dosage of Trodelvy with cycle 2 2. Fatigue and joint stiffness 3.  Chemotherapy-induced anemia today's hemoglobin is improving and today it is 11.1 4.  Nausea: Takes nausea medication 5. Constipation alternating with diarrhea 6.  Dryness of the face and acne   Jaw pain: better after augmentin   CT CAP 04/14/2022: Similar subpleural pulmonary nodules.  Decrease in mediastinal lymph node, stable bone metastases, regression/resolution of the central liver lesion, cirrhosis and hepatic steatosis   Return to clinic for [facility] and follow-ups

## 2022-04-23 NOTE — Progress Notes (Signed)
Per Dr. Lindi Adie, okay to proceed with out magnesium and phosphorus levels.

## 2022-04-24 LAB — CANCER ANTIGEN 27.29: CA 27.29: 148.6 U/mL — ABNORMAL HIGH (ref 0.0–38.6)

## 2022-04-25 ENCOUNTER — Encounter: Payer: Self-pay | Admitting: Hematology and Oncology

## 2022-05-06 MED FILL — Dexamethasone Sodium Phosphate Inj 100 MG/10ML: INTRAMUSCULAR | Qty: 1 | Status: AC

## 2022-05-06 MED FILL — Fosaprepitant Dimeglumine For IV Infusion 150 MG (Base Eq): INTRAVENOUS | Qty: 5 | Status: AC

## 2022-05-07 ENCOUNTER — Inpatient Hospital Stay: Payer: 59

## 2022-05-07 ENCOUNTER — Ambulatory Visit: Payer: 59 | Admitting: Hematology and Oncology

## 2022-05-07 ENCOUNTER — Ambulatory Visit: Payer: 59

## 2022-05-07 VITALS — BP 164/87 | HR 91 | Temp 98.3°F | Resp 18 | Wt 296.5 lb

## 2022-05-07 DIAGNOSIS — C787 Secondary malignant neoplasm of liver and intrahepatic bile duct: Secondary | ICD-10-CM

## 2022-05-07 DIAGNOSIS — C7800 Secondary malignant neoplasm of unspecified lung: Secondary | ICD-10-CM

## 2022-05-07 DIAGNOSIS — Z170 Estrogen receptor positive status [ER+]: Secondary | ICD-10-CM

## 2022-05-07 DIAGNOSIS — C50211 Malignant neoplasm of upper-inner quadrant of right female breast: Secondary | ICD-10-CM | POA: Diagnosis not present

## 2022-05-07 DIAGNOSIS — Z9013 Acquired absence of bilateral breasts and nipples: Secondary | ICD-10-CM | POA: Diagnosis not present

## 2022-05-07 DIAGNOSIS — J019 Acute sinusitis, unspecified: Secondary | ICD-10-CM | POA: Diagnosis not present

## 2022-05-07 DIAGNOSIS — C7951 Secondary malignant neoplasm of bone: Secondary | ICD-10-CM | POA: Diagnosis not present

## 2022-05-07 DIAGNOSIS — Z5112 Encounter for antineoplastic immunotherapy: Secondary | ICD-10-CM | POA: Diagnosis not present

## 2022-05-07 DIAGNOSIS — Z95828 Presence of other vascular implants and grafts: Secondary | ICD-10-CM

## 2022-05-07 DIAGNOSIS — Z79810 Long term (current) use of selective estrogen receptor modulators (SERMs): Secondary | ICD-10-CM | POA: Diagnosis not present

## 2022-05-07 LAB — CBC WITH DIFFERENTIAL (CANCER CENTER ONLY)
Abs Immature Granulocytes: 0.01 10*3/uL (ref 0.00–0.07)
Basophils Absolute: 0 10*3/uL (ref 0.0–0.1)
Basophils Relative: 0 %
Eosinophils Absolute: 0.1 10*3/uL (ref 0.0–0.5)
Eosinophils Relative: 3 %
HCT: 32.7 % — ABNORMAL LOW (ref 36.0–46.0)
Hemoglobin: 10.6 g/dL — ABNORMAL LOW (ref 12.0–15.0)
Immature Granulocytes: 0 %
Lymphocytes Relative: 12 %
Lymphs Abs: 0.5 10*3/uL — ABNORMAL LOW (ref 0.7–4.0)
MCH: 29.7 pg (ref 26.0–34.0)
MCHC: 32.4 g/dL (ref 30.0–36.0)
MCV: 91.6 fL (ref 80.0–100.0)
Monocytes Absolute: 0.4 10*3/uL (ref 0.1–1.0)
Monocytes Relative: 10 %
Neutro Abs: 3 10*3/uL (ref 1.7–7.7)
Neutrophils Relative %: 75 %
Platelet Count: 205 10*3/uL (ref 150–400)
RBC: 3.57 MIL/uL — ABNORMAL LOW (ref 3.87–5.11)
RDW: 18.2 % — ABNORMAL HIGH (ref 11.5–15.5)
WBC Count: 4 10*3/uL (ref 4.0–10.5)
nRBC: 0 % (ref 0.0–0.2)

## 2022-05-07 LAB — MAGNESIUM: Magnesium: 1.6 mg/dL — ABNORMAL LOW (ref 1.7–2.4)

## 2022-05-07 LAB — CMP (CANCER CENTER ONLY)
ALT: 43 U/L (ref 0–44)
AST: 33 U/L (ref 15–41)
Albumin: 3.5 g/dL (ref 3.5–5.0)
Alkaline Phosphatase: 202 U/L — ABNORMAL HIGH (ref 38–126)
Anion gap: 7 (ref 5–15)
BUN: 14 mg/dL (ref 6–20)
CO2: 32 mmol/L (ref 22–32)
Calcium: 9.1 mg/dL (ref 8.9–10.3)
Chloride: 103 mmol/L (ref 98–111)
Creatinine: 0.67 mg/dL (ref 0.44–1.00)
GFR, Estimated: >60 mL/min (ref >60)
Glucose, Bld: 129 mg/dL — ABNORMAL HIGH (ref 70–99)
Potassium: 3.1 mmol/L — ABNORMAL LOW (ref 3.5–5.1)
Sodium: 142 mmol/L (ref 135–145)
Total Bilirubin: 0.4 mg/dL (ref 0.3–1.2)
Total Protein: 6.7 g/dL (ref 6.5–8.1)

## 2022-05-07 LAB — PHOSPHORUS: Phosphorus: 3.1 mg/dL (ref 2.5–4.6)

## 2022-05-07 MED ORDER — FAMOTIDINE IN NACL 20-0.9 MG/50ML-% IV SOLN
20.0000 mg | Freq: Once | INTRAVENOUS | Status: AC
  Administered 2022-05-07: 20 mg via INTRAVENOUS
  Filled 2022-05-07: qty 50

## 2022-05-07 MED ORDER — ACETAMINOPHEN 325 MG PO TABS
650.0000 mg | ORAL_TABLET | Freq: Once | ORAL | Status: AC
  Administered 2022-05-07: 650 mg via ORAL
  Filled 2022-05-07: qty 2

## 2022-05-07 MED ORDER — SODIUM CHLORIDE 0.9% FLUSH
10.0000 mL | INTRAVENOUS | Status: DC | PRN
  Administered 2022-05-07: 10 mL

## 2022-05-07 MED ORDER — SODIUM CHLORIDE 0.9 % IV SOLN: Freq: Once | INTRAVENOUS | Status: AC

## 2022-05-07 MED ORDER — SODIUM CHLORIDE 0.9 % IV SOLN
10.0000 mg | Freq: Once | INTRAVENOUS | Status: AC
  Administered 2022-05-07: 10 mg via INTRAVENOUS
  Filled 2022-05-07: qty 10

## 2022-05-07 MED ORDER — SODIUM CHLORIDE 0.9 % IV SOLN
6.0000 mg/kg | Freq: Once | INTRAVENOUS | Status: AC
  Administered 2022-05-07: 790 mg via INTRAVENOUS
  Filled 2022-05-07: qty 79

## 2022-05-07 MED ORDER — PALONOSETRON HCL INJECTION 0.25 MG/5ML
0.2500 mg | Freq: Once | INTRAVENOUS | Status: AC
  Administered 2022-05-07: 0.25 mg via INTRAVENOUS
  Filled 2022-05-07: qty 5

## 2022-05-07 MED ORDER — SODIUM CHLORIDE 0.9 % IV SOLN
150.0000 mg | Freq: Once | INTRAVENOUS | Status: AC
  Administered 2022-05-07: 150 mg via INTRAVENOUS
  Filled 2022-05-07: qty 150

## 2022-05-07 MED ORDER — DIPHENHYDRAMINE HCL 50 MG/ML IJ SOLN
50.0000 mg | Freq: Once | INTRAMUSCULAR | Status: AC
  Administered 2022-05-07: 50 mg via INTRAVENOUS
  Filled 2022-05-07: qty 1

## 2022-05-07 MED ORDER — HEPARIN SOD (PORK) LOCK FLUSH 100 UNIT/ML IV SOLN
500.0000 [IU] | Freq: Once | INTRAVENOUS | Status: AC | PRN
  Administered 2022-05-07: 500 [IU]

## 2022-05-07 MED ORDER — MAGNESIUM SULFATE 2 GM/50ML IV SOLN
2.0000 g | Freq: Once | INTRAVENOUS | Status: AC
  Administered 2022-05-07: 2 g via INTRAVENOUS
  Filled 2022-05-07: qty 50

## 2022-05-07 MED ORDER — SODIUM CHLORIDE 0.9% FLUSH
10.0000 mL | Freq: Once | INTRAVENOUS | Status: AC
  Administered 2022-05-07: 10 mL

## 2022-05-07 NOTE — Patient Instructions (Signed)
[facility]  Discharge Instructions: Thank you for choosing [facility] to provide your oncology and hematology care.   If you have a lab appointment with the [name], please go directly to the [facility] and check in at the registration area.   Wear comfortable clothing and clothing appropriate for easy access to any Portacath or PICC line.   We strive to give you quality time with your provider. You may need to reschedule your appointment if you arrive late (15 or more minutes).  Arriving late affects you and other patients whose appointments are after yours.  Also, if you miss three or more appointments without notifying the office, you may be dismissed from the clinic at the provider's discretion.      For prescription refill requests, have your pharmacy contact our office and allow 72 hours for refills to be completed.    Today you received the following chemotherapy and/or immunotherapy agent: [name]   To help prevent nausea and vomiting after your treatment, we encourage you to take your nausea medication as directed.  BELOW ARE SYMPTOMS THAT SHOULD BE REPORTED IMMEDIATELY: *FEVER GREATER THAN 100.4 F (38 C) OR HIGHER *CHILLS OR SWEATING *NAUSEA AND VOMITING THAT IS NOT CONTROLLED WITH YOUR NAUSEA MEDICATION *UNUSUAL SHORTNESS OF BREATH *UNUSUAL BRUISING OR BLEEDING *URINARY PROBLEMS (pain or burning when urinating, or frequent urination) *BOWEL PROBLEMS (unusual diarrhea, constipation, pain near the anus) TENDERNESS IN MOUTH AND THROAT WITH OR WITHOUT PRESENCE OF ULCERS (sore throat, sores in mouth, or a toothache) UNUSUAL RASH, SWELLING OR PAIN  UNUSUAL VAGINAL DISCHARGE OR ITCHING   Items with * indicate a potential emergency and should be followed up as soon as possible or go to the Emergency Department if any problems should occur.  Please show the CHEMOTHERAPY ALERT CARD or IMMUNOTHERAPY ALERT CARD at check-in to the  Emergency Department and triage nurse.  Should you have questions after your visit or need to cancel or reschedule your appointment, please contact [facility]  Dept: [phone]  and follow the prompts.  Office hours are 8:00 a.m. to 4:30 p.m. Monday - Friday. Please note that voicemails left after 4:00 p.m. may not be returned until the following business day.  We are closed weekends and major holidays. You have access to a nurse at all times for urgent questions. Please call the main number to the clinic Dept: [phone] and follow the prompts.   For any non-urgent questions, you may also contact your provider using MyChart. We now offer e-Visits for anyone 32 and older to request care online for non-urgent symptoms. For details visit [email or web address].   Also download the MyChart app! Go to the app store, search "MyChart", open the app, select [facility], and log in with your MyChart username and password.  Masks are optional in the cancer centers. If you would like for your care team to wear a mask while they are taking care of you, please let them know. You may have one support person who is at least 58 years old accompany you for your appointments. Sacituzumab Govitecan Injection What is this medication? SACITUZUMAB GOVITECAN (SAK i TOOZ ue mab GOE vi TEE kan) treats breast cancer. It may also be used to treat bladder cancer and kidney cancer. It works by blocking a protein that causes cancer cells to grow and multiply. This helps to slow or stop the spread of cancer cells. This medicine may be used for other  purposes; ask your health care provider or pharmacist if you have questions. COMMON BRAND NAME(S): TRODELVY What should I tell my care team before I take this medication? They need to know if you have any of these conditions: Carry the UGT1A1*28 gene Infection Liver disease An unusual or allergic reaction to sacituzumab govitecan, other  medications, foods, dyes, or preservatives Pregnant or trying to get pregnant Breast-feeding How should I use this medication? This medication is injected into a vein. It is given by your care team in a hospital or clinic setting. Talk to your care team about the use of this medication in children. Special care may be needed. Overdosage: If you think you have taken too much of this medicine contact a poison control center or emergency room at once. NOTE: This medicine is only for you. Do not share this medicine with others. What if I miss a dose? Keep appointments for follow-up doses. It is important not to miss your dose. Call your care team if you are unable to keep an appointment. What may interact with this medication? This medication may affect how other medications work, and other medications may affect the way this medication works. Talk with your care team about all of the medications you take. They may suggest changes to your treatment plan to lower the risk of side effects and to make sure your medications work as intended. This list may not describe all possible interactions. Give your health care provider a list of all the medicines, herbs, non-prescription drugs, or dietary supplements you use. Also tell them if you smoke, drink alcohol, or use illegal drugs. Some items may interact with your medicine. What should I watch for while using this medication? This medication may make you feel generally unwell. This is not uncommon as chemotherapy can affect healthy cells as well as cancer cells. Report any side effects. Continue your course of treatment even though you feel ill unless your care team tells you to stop. You may need blood work while you are taking this medication. Certain genetic factors may decrease the safety of this medication. Your care team may use genetic tests to determine treatment. This medication can cause serious allergic reactions. To reduce your risk, your care  team may give you other medications to take before receiving this one. Be sure to follow the directions from your care team. Check with your care team if you have severe diarrhea, nausea, and vomiting, or if you sweat a lot. The loss of too much body fluid may make it dangerous for you to take this medication. Talk to your care team if you wish to become pregnant or think you might be pregnant. This medication can cause serious birth defects if taken during pregnancy or if you get pregnant within 6 months after stopping treatment. A negative pregnancy test is required before starting this medication. A reliable form of contraception is recommended while taking this medication and for 6 months after stopping treatment. Talk to your care team about reliable forms of contraception. Use a condom during sex and for 3 months after stopping treatment. Tell your care team right away if you think your partner might be pregnant. This medication can cause serious birth defects. Do not breast-feed while taking this medication and for 1 month after stopping therapy. This medication may cause infertility. Talk to your care team if you are concerned about your fertility. This medication may increase your risk of getting an infection. Call your care team for advice if  you get a fever, chills, sore throat, or other symptoms of a cold or flu. Do not treat yourself. Try to avoid being around people who are sick. Avoid taking medications that contain aspirin, acetaminophen, ibuprofen, naproxen, or ketoprofen unless instructed by your care team. These medications may hide a fever. This medication may increase blood sugar. The risk may be higher in patients who already have diabetes. Ask your care team what you can do to lower your risk of diabetes while taking this medication. What side effects may I notice from receiving this medication? Side effects that you should report to your care team as soon as possible: Allergic  reactions--skin rash, itching, hives, swelling of the face, lips, tongue, or throat Infection--fever, chills, cough, or sore throat Infusion reactions--chest pain, shortness of breath or trouble breathing, feeling faint or lightheaded Low red blood cell level--unusual weakness or fatigue, dizziness, headache, trouble breathing Severe or prolonged diarrhea Side effects that usually do not require medical attention (report these to your care team if they continue or are bothersome): Constipation Diarrhea Fatigue Hair loss Loss of appetite Nausea Vomiting This list may not describe all possible side effects. Call your doctor for medical advice about side effects. You may report side effects to FDA at 1-800-FDA-1088. Where should I keep my medication? This medication is given in a hospital or clinic. It will not be stored at home. NOTE: This sheet is a summary. It may not cover all possible information. If you have questions about this medicine, talk to your doctor, pharmacist, or health care provider.  2023 Elsevier/Gold Standard (2021-06-26 00:00:00) Denosumab Injection (Oncology) What is this medication? DENOSUMAB (den oh SUE mab) prevents weakened bones caused by cancer. It may also be used to treat noncancerous bone tumors that cannot be removed by surgery. It can also be used to treat high calcium levels in the blood caused by cancer. It works by blocking a protein that causes bones to break down quickly. This slows down the release of calcium from bones, which lowers calcium levels in your blood. It also makes your bones stronger and less likely to break (fracture). This medicine may be used for other purposes; ask your health care provider or pharmacist if you have questions. COMMON BRAND NAME(S): XGEVA What should I tell my care team before I take this medication? They need to know if you have any of these conditions: Dental disease Having surgery or tooth  extraction Infection Kidney disease Low levels of calcium or vitamin D in the blood Malnutrition On hemodialysis Skin conditions or sensitivity Thyroid or parathyroid disease An unusual reaction to denosumab, other medications, foods, dyes, or preservatives Pregnant or trying to get pregnant Breast-feeding How should I use this medication? This medication is for injection under the skin. It is given by your care team in a hospital or clinic setting. A special MedGuide will be given to you before each treatment. Be sure to read this information carefully each time. Talk to your care team about the use of this medication in children. While it may be prescribed for children as young as 13 years for selected conditions, precautions do apply. Overdosage: If you think you have taken too much of this medicine contact a poison control center or emergency room at once. NOTE: This medicine is only for you. Do not share this medicine with others. What if I miss a dose? Keep appointments for follow-up doses. It is important not to miss your dose. Call your care team if you  are unable to keep an appointment. What may interact with this medication? Do not take this medication with any of the following: Other medications containing denosumab This medication may also interact with the following: Medications that lower your chance of fighting infection Steroid medications, such as prednisone or cortisone This list may not describe all possible interactions. Give your health care provider a list of all the medicines, herbs, non-prescription drugs, or dietary supplements you use. Also tell them if you smoke, drink alcohol, or use illegal drugs. Some items may interact with your medicine. What should I watch for while using this medication? Your condition will be monitored carefully while you are receiving this medication. You may need blood work while taking this medication. This medication may increase  your risk of getting an infection. Call your care team for advice if you get a fever, chills, sore throat, or other symptoms of a cold or flu. Do not treat yourself. Try to avoid being around people who are sick. You should make sure you get enough calcium and vitamin D while you are taking this medication, unless your care team tells you not to. Discuss the foods you eat and the vitamins you take with your care team. Some people who take this medication have severe bone, joint, or muscle pain. This medication may also increase your risk for jaw problems or a broken thigh bone. Tell your care team right away if you have severe pain in your jaw, bones, joints, or muscles. Tell your care team if you have any pain that does not go away or that gets worse. Talk to your care team if you may be pregnant. Serious birth defects can occur if you take this medication during pregnancy and for 5 months after the last dose. You will need a negative pregnancy test before starting this medication. Contraception is recommended while taking this medication and for 5 months after the last dose. Your care team can help you find the option that works for you. What side effects may I notice from receiving this medication? Side effects that you should report to your care team as soon as possible: Allergic reactions--skin rash, itching, hives, swelling of the face, lips, tongue, or throat Bone, joint, or muscle pain Low calcium level--muscle pain or cramps, confusion, tingling, or numbness in the hands or feet Osteonecrosis of the jaw--pain, swelling, or redness in the mouth, numbness of the jaw, poor healing after dental work, unusual discharge from the mouth, visible bones in the mouth Side effects that usually do not require medical attention (report to your care team if they continue or are bothersome): Cough Diarrhea Fatigue Headache Nausea This list may not describe all possible side effects. Call your doctor for  medical advice about side effects. You may report side effects to FDA at 1-800-FDA-1088. Where should I keep my medication? This medication is given in a hospital or clinic. It will not be stored at home. NOTE: This sheet is a summary. It may not cover all possible information. If you have questions about this medicine, talk to your doctor, pharmacist, or health care provider.  2023 Elsevier/Gold Standard (2021-09-15 00:00:00)

## 2022-05-07 NOTE — Progress Notes (Signed)
Ok to treat with Mg 1.6 today per Dr. Learta Codding. Last phosphorus was 3.8- today's still pending- ok.

## 2022-05-08 ENCOUNTER — Telehealth: Payer: Self-pay

## 2022-05-08 NOTE — Telephone Encounter (Signed)
Perfect.  Thanks for letting me know.

## 2022-05-08 NOTE — Telephone Encounter (Signed)
-----   Message from Gardenia Phlegm, NP sent at 05/07/2022  4:49 PM EST ----- Potassium and magnesium are both low.  Is she having any diarrhea?  ----- Message ----- From: Interface, [facility] Sent: 05/07/2022   8:06 AM EST To: Nicholas Lose, MD

## 2022-05-08 NOTE — Telephone Encounter (Signed)
Called Pt with below questions. Pt denies diarrhea. Pt states she received IV Mag from infusion yesterday. Pt was instructed by infusion nurse to increase potassium supplement to 40 meq daily for 3 days. Pt has lab, MD visit, and infusions appts scheduled for 05/14/22.

## 2022-05-11 NOTE — Progress Notes (Signed)
Patient Care Team: Kelton Pillar, MD as PCP - General (Family Medicine) Jacelyn Pi, MD as Consulting Physician (Endocrinology) Jovita Kussmaul, MD as Consulting Physician (General Surgery) [name], MD as Attending Physician (Radiation Oncology) Belva Crome, MD as Consulting Physician (Cardiology) [name] [name], NP as Nurse Practitioner (Hematology and Oncology) Lajoyce Lauber, DMD (Dentistry) Raina Mina, RPH-CPP (Pharmacist) Nicholas Lose, MD as Consulting Physician (Hematology and Oncology)  DIAGNOSIS: No diagnosis found.  SUMMARY OF ONCOLOGIC HISTORY: Oncology History  Malignant neoplasm of upper-inner quadrant of right breast in female, estrogen receptor positive ([place])  10/15/2015 Initial Biopsy   Right breast upper inner quadrant biopsy: IDC, DCIS, grade 2, ER+(90%), PR+(50%), Ki67 70%, HER-2 negative (ratio 1.18).   10/17/2015 Initial Diagnosis   Malignant neoplasm of upper-inner quadrant of right breast in female, estrogen receptor positive ([place])   10/2015 -  Anti-estrogen oral therapy   Tamoxifen daily   11/28/2015 Surgery   Left breast mammoplasty: IDC, grade 1, 1.2cm, DCIS, lobular neoplasia, T1c, Nx, ER 80%, PR60%, Ki67 3% HER-2 neg Right breast lumpectomy: IDC, grade 1, 2.3cm, DCIS. T2N1a ER/PR+HER-2 neg, Ki 70%.   11/28/2015 Miscellaneous   Mammaprint was Low Risk. Predicting a 98% chance of no disease recurrence within 5 years with anti-estrogens as the only systemic therapy.  It also predicts minimal to no benefit from chemotherapy.     12/26/2015 Surgery   Bilateral mastectomies: bilateral sentinel node sampling, Right: no residual carcinoma, left, focal DCIS, 4 SLN negative   02/11/2016 - 03/27/2016 Radiation Therapy   Adjuvant radiation [name]): 1) Right chest wall: 50.4 Gy in 28 fractions  2) Right chest wall boost: 10 Gy in 5 fractions   08/04/2017 Relapse/Recurrence   08/04/2017, MRI of the liver and total spine MRI 08/11/2017  showed liver and bone metastasis; liver biopsy 09/07/2017 confirms estrogen receptor positive, progesterone receptor negative, HER-2 negative metastatic breast cancer; chest CT scan 08/13/2017 shows multiple bilateral pulmonary nodules.   08/17/2017 - 01/04/2018 Anti-estrogen oral therapy   fulvestrant  palbociclib started 08/31/2017 discontinued 01/04/2018 with evidence of progression in the liver   08/2017 Miscellaneous   [facility] report on liver biopsy from April 2019 shows a negative PDL 1, stable MSI and proficient mismatch repair status.  The tumor is positive for the androgen receptor, and for PTEN for PIK3 CA mutations.  HER-2/neu was read as equivocal (it was negative by FISH on the pathology report).   01/04/2018 Miscellaneous   anastrozole started 01/04/2018 everolimus started 01/29/2018   02/16/2018 - 03/01/2018 Radiation Therapy   01/28/18 prophylactic left femur intramedullary nail surgery for impending left femur pathologic fracture alliative radiation with Dr. [name] to the left femur and left ilium from 02/16/2018 to 03/01/2018   01/30/2020 - 05/2020 Anti-estrogen oral therapy   alpelisib started 01/30/2020 at 300 mg daily with Metformin 500 mg daily and Faslodex (stopped for progression)   05/22/2020 - 08/2020 Chemotherapy   Xeloda discontinued for progression   09/10/2020 - 06/18/2021 Chemotherapy   Patient is on Treatment Plan : BREAST Paclitaxel D1,D15 q28d     07/17/2021 - 12/18/2021 Chemotherapy   Patient is on Treatment Plan : BREAST METASTATIC Sacituzumab govitecan-hziy [name]) q21d     01/01/2022 -  Chemotherapy   Patient is on Treatment Plan : BREAST METASTATIC Sacituzumab govitecan-hziy [name]) D1,8 q21d     Bilateral malignant neoplasm of breast in female, estrogen receptor positive ([place])  12/26/2015 Initial Diagnosis   Bilateral malignant neoplasm of breast in female, estrogen receptor positive (  [place])   07/17/2021 - 12/18/2021 Chemotherapy   Patient is on Treatment Plan  : BREAST METASTATIC Sacituzumab govitecan-hziy [name]) q21d     01/01/2022 -  Chemotherapy   Patient is on Treatment Plan : BREAST METASTATIC Sacituzumab govitecan-hziy [name]) D1,8 q21d     Malignant neoplasm metastatic to liver ([place])  08/13/2017 Initial Diagnosis   Liver metastases ([place])   07/17/2021 - 12/18/2021 Chemotherapy   Patient is on Treatment Plan : BREAST METASTATIC Sacituzumab govitecan-hziy [name]) q21d     01/01/2022 -  Chemotherapy   Patient is on Treatment Plan : BREAST METASTATIC Sacituzumab govitecan-hziy [name]) D1,8 q21d     Malignant neoplasm metastatic to lung ([place])  09/10/2020 Initial Diagnosis   Lung metastases ([place])   07/17/2021 - 12/18/2021 Chemotherapy   Patient is on Treatment Plan : BREAST METASTATIC Sacituzumab govitecan-hziy [name]) q21d     01/01/2022 -  Chemotherapy   Patient is on Treatment Plan : BREAST METASTATIC Sacituzumab govitecan-hziy [name]) D1,8 q21d       CHIEF COMPLIANT: Follow-up metastatic breast cancer    INTERVAL HISTORY: 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 She presents to the clinic for a follow-up    ALLERGIES:  is allergic to ace inhibitors, atorvastatin, and simvastatin.  MEDICATIONS:  Current Outpatient Medications  Medication Sig Dispense Refill   acetaminophen (TYLENOL) 500 MG tablet Take 1,000 mg by mouth every 6 (six) hours as needed.     Calcium-Magnesium 250-125 MG TABS Take 1 tablet by mouth daily. 120 each 4   cetirizine (ZYRTEC) 10 MG tablet Take 10 mg by mouth daily as needed for allergies.      chlorhexidine (PERIDEX) 0.12 % solution Use 5 mLs in the mouth or throat 2 (two) times daily as needed as directed. ****Do not swallow**** 473 mL 6   chlorhexidine (PERIDEX) 0.12 % solution Swish using 28ms for 2 full minutes then spit. do this twice daily 473 mL 0   cyanocobalamin (VITAMIN B12) 1000 MCG tablet Take 1,000 mcg by mouth daily.     diphenoxylate-atropine (LOMOTIL) 2.5-0.025 MG tablet Take 1 tablet  by mouth 4 (four) times daily as needed for diarrhea or loose stools. 30 tablet 2   flurbiprofen (ANSAID) 100 MG tablet Take by mouth.     furosemide (LASIX) 20 MG tablet Take 1 tablet (20 mg total) by mouth daily. 90 tablet 6   glucose blood (FREESTYLE LITE) test strip Use 1 strip to check blood sugar 3 (three) times daily. 50 strip 3   levothyroxine (SYNTHROID) 175 MCG tablet Take 1 tablet (175 mcg total) by mouth in the morning on an empty stomach 90 tablet 1   losartan-hydrochlorothiazide (HYZAAR) 100-25 MG tablet Take 1 tablet by mouth daily. 30 tablet 11   losartan-hydrochlorothiazide (HYZAAR) 100-25 MG tablet Take 1 tablet by mouth daily. 30 tablet 11   metFORMIN (GLUCOPHAGE-XR) 500 MG 24 hr tablet Take 1 tablet by mouth  with evening meal 90 tablet 1   Multiple Vitamin (MULTIVITAMIN) tablet Take 1 tablet by mouth daily.     omega-3 acid ethyl esters (LOVAZA) 1 g capsule Take 1 g by mouth 2 (two) times daily.      omeprazole (PRILOSEC) 40 MG capsule Take 1 capsule (40 mg total) by mouth daily. 30 capsule 3   ondansetron (ZOFRAN-ODT) 8 MG disintegrating tablet Dissolve 1 tablet (8 mg total) by mouth every 8 (eight) hours as needed for nausea or vomiting. 20 tablet 0   oxyCODONE (OXY IR/ROXICODONE) 5 MG immediate release tablet  Take 1 tablet (5 mg total) by mouth every 4 (four) hours as needed for severe pain. 60 tablet 0   oxyCODONE-acetaminophen (PERCOCET/ROXICET) 5-325 MG tablet Take 1 tablet by mouth every 8 (eight) hours as needed for severe pain. 20 tablet 0   potassium chloride (KLOR-CON) 10 MEQ tablet Take 2 tablets (20 mEq total) by mouth daily. 60 tablet 1   pregabalin (LYRICA) 100 MG capsule Take 1 capsule by mouth 3 times daily. 90 capsule 3   rosuvastatin (CRESTOR) 5 MG tablet Take 1 tablet (5 mg total) by mouth daily. 90 tablet 1   sertraline (ZOLOFT) 100 MG tablet Take 1 tablet (100 mg total) by mouth daily. 90 tablet 3   Vitamin D, Cholecalciferol, 1000 units CAPS Take 1 tablet  by mouth daily. (Patient taking differently: Take 1,000 Units by mouth daily.) 90 capsule 4   No current facility-administered medications for this visit.    PHYSICAL EXAMINATION: ECOG PERFORMANCE STATUS: {CHL ONC ECOG PS:[id number]}  There were no vitals filed for this visit. There were no vitals filed for this visit.  BREAST:*** No palpable masses or nodules in either right or left breasts. No palpable axillary supraclavicular or infraclavicular adenopathy no breast tenderness or nipple discharge. (exam performed in the presence of a chaperone)  LABORATORY DATA:  I have reviewed the data as listed    Latest Ref Rng & Units 05/07/2022    7:49 AM 04/23/2022    7:53 AM 04/16/2022    7:48 AM  CMP  Glucose 70 - 99 mg/dL 129  117  139   BUN 6 - 20 mg/dL _0 Creatinine 0.44 - 1.00 mg/dL 0.67  0.72  0.65   Sodium 135 - 145 mmol/L 142  142  141   Potassium 3.5 - 5.1 mmol/L 3.1  3.7  3.3   Chloride 98 - 111 mmol/L 103  103  103   CO2 22 - 32 mmol/L 32  33  32   Calcium 8.9 - 10.3 mg/dL 9.1  9.4  9.5   Total Protein 6.5 - 8.1 g/dL 6.7  6.5  6.7   Total Bilirubin 0.3 - 1.2 mg/dL 0.4  0.3  0.5   Alkaline Phos 38 - 126 U/L 202  234  219   AST 15 - 41 U/L 33  39  42   ALT 0 - 44 U/L 43  46  54     Lab Results  Component Value Date   WBC 4.0 05/07/2022   HGB 10.6 (L) 05/07/2022   HCT 32.7 (L) 05/07/2022   MCV 91.6 05/07/2022   PLT 205 05/07/2022   NEUTROABS 3.0 05/07/2022    ASSESSMENT & PLAN:  No problem-specific Assessment & Plan notes found for this encounter.    No orders of the defined types were placed in this encounter.  The patient has a good understanding of the overall plan. she agrees with it. she will call with any problems that may develop before the next visit here. Total time spent: 30 mins including face to face time and time spent for planning, charting and co-ordination of care   Suzzette Righter, [place] 05/11/22    I Gardiner Coins am acting  as a Education administrator for [employer]  ***

## 2022-05-13 ENCOUNTER — Other Ambulatory Visit (HOSPITAL_COMMUNITY): Payer: Self-pay

## 2022-05-13 MED FILL — Fosaprepitant Dimeglumine For IV Infusion 150 MG (Base Eq): INTRAVENOUS | Qty: 5 | Status: AC

## 2022-05-13 MED FILL — Dexamethasone Sodium Phosphate Inj 100 MG/10ML: INTRAMUSCULAR | Qty: 1 | Status: AC

## 2022-05-14 ENCOUNTER — Inpatient Hospital Stay: Payer: Commercial Managed Care - PPO | Attending: Oncology

## 2022-05-14 ENCOUNTER — Inpatient Hospital Stay: Payer: Commercial Managed Care - PPO

## 2022-05-14 ENCOUNTER — Inpatient Hospital Stay (HOSPITAL_BASED_OUTPATIENT_CLINIC_OR_DEPARTMENT_OTHER): Payer: Commercial Managed Care - PPO | Admitting: Hematology and Oncology

## 2022-05-14 VITALS — BP 145/66 | HR 90 | Temp 98.1°F | Resp 18

## 2022-05-14 VITALS — BP 159/76 | HR 96 | Temp 97.8°F | Wt 293.2 lb

## 2022-05-14 DIAGNOSIS — C7800 Secondary malignant neoplasm of unspecified lung: Secondary | ICD-10-CM

## 2022-05-14 DIAGNOSIS — G893 Neoplasm related pain (acute) (chronic): Secondary | ICD-10-CM | POA: Diagnosis not present

## 2022-05-14 DIAGNOSIS — C7951 Secondary malignant neoplasm of bone: Secondary | ICD-10-CM

## 2022-05-14 DIAGNOSIS — C787 Secondary malignant neoplasm of liver and intrahepatic bile duct: Secondary | ICD-10-CM | POA: Diagnosis not present

## 2022-05-14 DIAGNOSIS — Z170 Estrogen receptor positive status [ER+]: Secondary | ICD-10-CM

## 2022-05-14 DIAGNOSIS — Z9013 Acquired absence of bilateral breasts and nipples: Secondary | ICD-10-CM | POA: Insufficient documentation

## 2022-05-14 DIAGNOSIS — Z923 Personal history of irradiation: Secondary | ICD-10-CM | POA: Diagnosis not present

## 2022-05-14 DIAGNOSIS — C50112 Malignant neoplasm of central portion of left female breast: Secondary | ICD-10-CM | POA: Diagnosis not present

## 2022-05-14 DIAGNOSIS — Z79899 Other long term (current) drug therapy: Secondary | ICD-10-CM | POA: Insufficient documentation

## 2022-05-14 DIAGNOSIS — Z5112 Encounter for antineoplastic immunotherapy: Secondary | ICD-10-CM | POA: Insufficient documentation

## 2022-05-14 DIAGNOSIS — C50111 Malignant neoplasm of central portion of right female breast: Secondary | ICD-10-CM | POA: Diagnosis not present

## 2022-05-14 DIAGNOSIS — C50211 Malignant neoplasm of upper-inner quadrant of right female breast: Secondary | ICD-10-CM | POA: Diagnosis not present

## 2022-05-14 DIAGNOSIS — Z95828 Presence of other vascular implants and grafts: Secondary | ICD-10-CM

## 2022-05-14 LAB — CBC WITH DIFFERENTIAL (CANCER CENTER ONLY)
Abs Immature Granulocytes: 0.04 10*3/uL (ref 0.00–0.07)
Basophils Absolute: 0 10*3/uL (ref 0.0–0.1)
Basophils Relative: 1 %
Eosinophils Absolute: 0.1 10*3/uL (ref 0.0–0.5)
Eosinophils Relative: 3 %
HCT: 33.4 % — ABNORMAL LOW (ref 36.0–46.0)
Hemoglobin: 10.9 g/dL — ABNORMAL LOW (ref 12.0–15.0)
Immature Granulocytes: 1 %
Lymphocytes Relative: 15 %
Lymphs Abs: 0.5 10*3/uL — ABNORMAL LOW (ref 0.7–4.0)
MCH: 29.9 pg (ref 26.0–34.0)
MCHC: 32.6 g/dL (ref 30.0–36.0)
MCV: 91.8 fL (ref 80.0–100.0)
Monocytes Absolute: 0.5 10*3/uL (ref 0.1–1.0)
Monocytes Relative: 15 %
Neutro Abs: 2.3 10*3/uL (ref 1.7–7.7)
Neutrophils Relative %: 65 %
Platelet Count: 254 10*3/uL (ref 150–400)
RBC: 3.64 MIL/uL — ABNORMAL LOW (ref 3.87–5.11)
RDW: 18.4 % — ABNORMAL HIGH (ref 11.5–15.5)
WBC Count: 3.5 10*3/uL — ABNORMAL LOW (ref 4.0–10.5)
nRBC: 0 % (ref 0.0–0.2)

## 2022-05-14 LAB — CMP (CANCER CENTER ONLY)
ALT: 49 U/L — ABNORMAL HIGH (ref 0–44)
AST: 41 U/L (ref 15–41)
Albumin: 3.7 g/dL (ref 3.5–5.0)
Alkaline Phosphatase: 224 U/L — ABNORMAL HIGH (ref 38–126)
Anion gap: 6 (ref 5–15)
BUN: 12 mg/dL (ref 6–20)
CO2: 31 mmol/L (ref 22–32)
Calcium: 9.2 mg/dL (ref 8.9–10.3)
Chloride: 103 mmol/L (ref 98–111)
Creatinine: 0.85 mg/dL (ref 0.44–1.00)
GFR, Estimated: >60 mL/min (ref >60)
Glucose, Bld: 117 mg/dL — ABNORMAL HIGH (ref 70–99)
Potassium: 3.5 mmol/L (ref 3.5–5.1)
Sodium: 140 mmol/L (ref 135–145)
Total Bilirubin: 0.4 mg/dL (ref 0.3–1.2)
Total Protein: 6.4 g/dL — ABNORMAL LOW (ref 6.5–8.1)

## 2022-05-14 MED ORDER — SODIUM CHLORIDE 0.9 % IV SOLN: Freq: Once | INTRAVENOUS | Status: AC

## 2022-05-14 MED ORDER — PALONOSETRON HCL INJECTION 0.25 MG/5ML
0.2500 mg | Freq: Once | INTRAVENOUS | Status: AC
  Administered 2022-05-14: 0.25 mg via INTRAVENOUS
  Filled 2022-05-14: qty 5

## 2022-05-14 MED ORDER — HEPARIN SOD (PORK) LOCK FLUSH 100 UNIT/ML IV SOLN
500.0000 [IU] | Freq: Once | INTRAVENOUS | Status: AC | PRN
  Administered 2022-05-14: 500 [IU]

## 2022-05-14 MED ORDER — DIPHENHYDRAMINE HCL 50 MG/ML IJ SOLN
50.0000 mg | Freq: Once | INTRAMUSCULAR | Status: AC
  Administered 2022-05-14: 50 mg via INTRAVENOUS
  Filled 2022-05-14: qty 1

## 2022-05-14 MED ORDER — SODIUM CHLORIDE 0.9 % IV SOLN
150.0000 mg | Freq: Once | INTRAVENOUS | Status: AC
  Administered 2022-05-14: 150 mg via INTRAVENOUS
  Filled 2022-05-14: qty 150

## 2022-05-14 MED ORDER — SODIUM CHLORIDE 0.9 % IV SOLN
10.0000 mg | Freq: Once | INTRAVENOUS | Status: AC
  Administered 2022-05-14: 10 mg via INTRAVENOUS
  Filled 2022-05-14: qty 10

## 2022-05-14 MED ORDER — FAMOTIDINE IN NACL 20-0.9 MG/50ML-% IV SOLN
20.0000 mg | Freq: Once | INTRAVENOUS | Status: AC
  Administered 2022-05-14: 20 mg via INTRAVENOUS
  Filled 2022-05-14: qty 50

## 2022-05-14 MED ORDER — SODIUM CHLORIDE 0.9% FLUSH
10.0000 mL | Freq: Once | INTRAVENOUS | Status: AC
  Administered 2022-05-14: 10 mL

## 2022-05-14 MED ORDER — ACETAMINOPHEN 325 MG PO TABS
650.0000 mg | ORAL_TABLET | Freq: Once | ORAL | Status: AC
  Administered 2022-05-14: 650 mg via ORAL
  Filled 2022-05-14: qty 2

## 2022-05-14 MED ORDER — SODIUM CHLORIDE 0.9 % IV SOLN
6.0000 mg/kg | Freq: Once | INTRAVENOUS | Status: AC
  Administered 2022-05-14: 790 mg via INTRAVENOUS
  Filled 2022-05-14: qty 79

## 2022-05-14 MED ORDER — SODIUM CHLORIDE 0.9% FLUSH
10.0000 mL | INTRAVENOUS | Status: DC | PRN
  Administered 2022-05-14: 10 mL

## 2022-05-14 NOTE — Patient Instructions (Signed)
[facility]  Discharge Instructions: Thank you for choosing [facility] to provide your oncology and hematology care.   If you have a lab appointment with the [facility], please go directly to the [facility] and check in at the registration area.   Wear comfortable clothing and clothing appropriate for easy access to any Portacath or PICC line.   We strive to give you quality time with your provider. You may need to reschedule your appointment if you arrive late (15 or more minutes).  Arriving late affects you and other patients whose appointments are after yours.  Also, if you miss three or more appointments without notifying the office, you may be dismissed from the clinic at the provider's discretion.      For prescription refill requests, have your pharmacy contact our office and allow 72 hours for refills to be completed.    Today you received the following chemotherapy and/or immunotherapy agents: Trodelvy.      To help prevent nausea and vomiting after your treatment, we encourage you to take your nausea medication as directed.  BELOW ARE SYMPTOMS THAT SHOULD BE REPORTED IMMEDIATELY: *FEVER GREATER THAN 100.4 F (38 C) OR HIGHER *CHILLS OR SWEATING *NAUSEA AND VOMITING THAT IS NOT CONTROLLED WITH YOUR NAUSEA MEDICATION *UNUSUAL SHORTNESS OF BREATH *UNUSUAL BRUISING OR BLEEDING *URINARY PROBLEMS (pain or burning when urinating, or frequent urination) *BOWEL PROBLEMS (unusual diarrhea, constipation, pain near the anus) TENDERNESS IN MOUTH AND THROAT WITH OR WITHOUT PRESENCE OF ULCERS (sore throat, sores in mouth, or a toothache) UNUSUAL RASH, SWELLING OR PAIN  UNUSUAL VAGINAL DISCHARGE OR ITCHING   Items with * indicate a potential emergency and should be followed up as soon as possible or go to the Emergency Department if any problems should occur.  Please show the CHEMOTHERAPY ALERT CARD or IMMUNOTHERAPY ALERT CARD at check-in to  the Emergency Department and triage nurse.  Should you have questions after your visit or need to cancel or reschedule your appointment, please contact [facility]  Dept: [phone]  and follow the prompts.  Office hours are 8:00 a.m. to 4:30 p.m. Monday - Friday. Please note that voicemails left after 4:00 p.m. may not be returned until the following business day.  We are closed weekends and major holidays. You have access to a nurse at all times for urgent questions. Please call the main number to the clinic Dept: [phone] and follow the prompts.   For any non-urgent questions, you may also contact your provider using MyChart. We now offer e-Visits for anyone 68 and older to request care online for non-urgent symptoms. For details visit [email or web address].   Also download the MyChart app! Go to the app store, search "MyChart", open the app, select [facility], and log in with your MyChart username and password.

## 2022-05-14 NOTE — Progress Notes (Signed)
Per Lindi Adie, ok to treat with Mag of 1.6

## 2022-05-14 NOTE — Assessment & Plan Note (Signed)
Metastatic Breast Cancer: ER Pos and Her 2 Neg (previously progressed on Ibrance, fulvestrant, everolimus, alpelisib, Xeloda) Prior Treatment: Taxol 09/10/2020-06/18/21 Chronic right arm lymphedema IHC: HER2 0 ----------------------------------------------------------------------------------------------------------------------- Current treatment: Palliative treatment with Sacituzumab-Govitecan, today cycle 16   Toxicities: 1.  Neutropenia: We reduced the dosage of Trodelvy with cycle 2 2. Fatigue and joint stiffness 3.  Chemotherapy-induced anemia today's hemoglobin is improving and today it is 11.1 4.  Nausea: Takes nausea medication 5. Constipation alternating with diarrhea 6.  Dryness of the face and acne    CT CAP 04/14/2022: Similar subpleural pulmonary nodules.  Decrease in mediastinal lymph node, stable bone metastases, regression/resolution of the central liver lesion, cirrhosis and hepatic steatosis   Return to clinic for Trodelvy (days 1 and 8) and follow-ups every 3 weeks

## 2022-05-15 ENCOUNTER — Other Ambulatory Visit (HOSPITAL_COMMUNITY): Payer: Self-pay

## 2022-05-18 ENCOUNTER — Other Ambulatory Visit (HOSPITAL_COMMUNITY): Payer: Self-pay

## 2022-05-18 ENCOUNTER — Other Ambulatory Visit: Payer: Self-pay | Admitting: Adult Health

## 2022-05-18 ENCOUNTER — Other Ambulatory Visit: Payer: Self-pay

## 2022-05-18 DIAGNOSIS — E876 Hypokalemia: Secondary | ICD-10-CM

## 2022-05-18 MED ORDER — POTASSIUM CHLORIDE ER 10 MEQ PO TBCR
20.0000 meq | EXTENDED_RELEASE_TABLET | Freq: Every day | ORAL | 1 refills | Status: DC
  Filled 2022-05-18: qty 60, 30d supply, fill #0
  Filled 2022-06-25: qty 60, 30d supply, fill #1

## 2022-05-20 ENCOUNTER — Other Ambulatory Visit: Payer: Self-pay

## 2022-05-21 ENCOUNTER — Other Ambulatory Visit (HOSPITAL_COMMUNITY): Payer: Self-pay

## 2022-05-21 MED ORDER — METFORMIN HCL ER 500 MG PO TB24
500.0000 mg | ORAL_TABLET | Freq: Every evening | ORAL | 0 refills | Status: DC
  Filled 2022-05-21: qty 90, 90d supply, fill #0

## 2022-05-27 ENCOUNTER — Telehealth: Payer: Self-pay | Admitting: Hematology and Oncology

## 2022-05-27 NOTE — Telephone Encounter (Signed)
Scheduled appointments per WQ. Patient is aware. 

## 2022-05-28 ENCOUNTER — Inpatient Hospital Stay (HOSPITAL_BASED_OUTPATIENT_CLINIC_OR_DEPARTMENT_OTHER): Payer: Commercial Managed Care - PPO | Admitting: Hematology and Oncology

## 2022-05-28 ENCOUNTER — Inpatient Hospital Stay: Payer: Commercial Managed Care - PPO

## 2022-05-28 VITALS — BP 138/73 | HR 87 | Temp 97.7°F | Resp 18 | Wt 295.1 lb

## 2022-05-28 VITALS — BP 119/63 | HR 62 | Temp 98.1°F | Resp 18

## 2022-05-28 DIAGNOSIS — C7800 Secondary malignant neoplasm of unspecified lung: Secondary | ICD-10-CM | POA: Diagnosis not present

## 2022-05-28 DIAGNOSIS — C50111 Malignant neoplasm of central portion of right female breast: Secondary | ICD-10-CM

## 2022-05-28 DIAGNOSIS — C787 Secondary malignant neoplasm of liver and intrahepatic bile duct: Secondary | ICD-10-CM

## 2022-05-28 DIAGNOSIS — C50112 Malignant neoplasm of central portion of left female breast: Secondary | ICD-10-CM | POA: Diagnosis not present

## 2022-05-28 DIAGNOSIS — C50211 Malignant neoplasm of upper-inner quadrant of right female breast: Secondary | ICD-10-CM

## 2022-05-28 DIAGNOSIS — Z170 Estrogen receptor positive status [ER+]: Secondary | ICD-10-CM

## 2022-05-28 DIAGNOSIS — Z95828 Presence of other vascular implants and grafts: Secondary | ICD-10-CM

## 2022-05-28 DIAGNOSIS — Z5112 Encounter for antineoplastic immunotherapy: Secondary | ICD-10-CM | POA: Diagnosis not present

## 2022-05-28 LAB — CBC WITH DIFFERENTIAL (CANCER CENTER ONLY)
Abs Immature Granulocytes: 0.02 10*3/uL (ref 0.00–0.07)
Basophils Absolute: 0 10*3/uL (ref 0.0–0.1)
Basophils Relative: 0 %
Eosinophils Absolute: 0.1 10*3/uL (ref 0.0–0.5)
Eosinophils Relative: 2 %
HCT: 33.4 % — ABNORMAL LOW (ref 36.0–46.0)
Hemoglobin: 11.1 g/dL — ABNORMAL LOW (ref 12.0–15.0)
Immature Granulocytes: 0 %
Lymphocytes Relative: 13 %
Lymphs Abs: 0.6 10*3/uL — ABNORMAL LOW (ref 0.7–4.0)
MCH: 30.4 pg (ref 26.0–34.0)
MCHC: 33.2 g/dL (ref 30.0–36.0)
MCV: 91.5 fL (ref 80.0–100.0)
Monocytes Absolute: 0.6 10*3/uL (ref 0.1–1.0)
Monocytes Relative: 12 %
Neutro Abs: 3.5 10*3/uL (ref 1.7–7.7)
Neutrophils Relative %: 73 %
Platelet Count: 242 10*3/uL (ref 150–400)
RBC: 3.65 MIL/uL — ABNORMAL LOW (ref 3.87–5.11)
RDW: 18.6 % — ABNORMAL HIGH (ref 11.5–15.5)
WBC Count: 4.9 10*3/uL (ref 4.0–10.5)
nRBC: 0 % (ref 0.0–0.2)

## 2022-05-28 LAB — CMP (CANCER CENTER ONLY)
ALT: 57 U/L — ABNORMAL HIGH (ref 0–44)
AST: 41 U/L (ref 15–41)
Albumin: 3.5 g/dL (ref 3.5–5.0)
Alkaline Phosphatase: 226 U/L — ABNORMAL HIGH (ref 38–126)
Anion gap: 7 (ref 5–15)
BUN: 14 mg/dL (ref 6–20)
CO2: 31 mmol/L (ref 22–32)
Calcium: 9.5 mg/dL (ref 8.9–10.3)
Chloride: 102 mmol/L (ref 98–111)
Creatinine: 1.15 mg/dL — ABNORMAL HIGH (ref 0.44–1.00)
GFR, Estimated: 55 mL/min — ABNORMAL LOW (ref >60)
Glucose, Bld: 121 mg/dL — ABNORMAL HIGH (ref 70–99)
Potassium: 3.2 mmol/L — ABNORMAL LOW (ref 3.5–5.1)
Sodium: 140 mmol/L (ref 135–145)
Total Bilirubin: 0.4 mg/dL (ref 0.3–1.2)
Total Protein: 6.5 g/dL (ref 6.5–8.1)

## 2022-05-28 LAB — PHOSPHORUS: Phosphorus: 4.5 mg/dL (ref 2.5–4.6)

## 2022-05-28 LAB — MAGNESIUM: Magnesium: 1.7 mg/dL (ref 1.7–2.4)

## 2022-05-28 MED ORDER — SODIUM CHLORIDE 0.9 % IV SOLN
150.0000 mg | Freq: Once | INTRAVENOUS | Status: AC
  Administered 2022-05-28: 150 mg via INTRAVENOUS
  Filled 2022-05-28: qty 150

## 2022-05-28 MED ORDER — ACETAMINOPHEN 325 MG PO TABS
650.0000 mg | ORAL_TABLET | Freq: Once | ORAL | Status: AC
  Administered 2022-05-28: 650 mg via ORAL
  Filled 2022-05-28: qty 2

## 2022-05-28 MED ORDER — SODIUM CHLORIDE 0.9 % IV SOLN: Freq: Once | INTRAVENOUS | Status: AC

## 2022-05-28 MED ORDER — SODIUM CHLORIDE 0.9% FLUSH
10.0000 mL | Freq: Once | INTRAVENOUS | Status: AC
  Administered 2022-05-28: 10 mL

## 2022-05-28 MED ORDER — SODIUM CHLORIDE 0.9 % IV SOLN
6.0000 mg/kg | Freq: Once | INTRAVENOUS | Status: AC
  Administered 2022-05-28: 790 mg via INTRAVENOUS
  Filled 2022-05-28: qty 79

## 2022-05-28 MED ORDER — PALONOSETRON HCL INJECTION 0.25 MG/5ML
0.2500 mg | Freq: Once | INTRAVENOUS | Status: AC
  Administered 2022-05-28: 0.25 mg via INTRAVENOUS
  Filled 2022-05-28: qty 5

## 2022-05-28 MED ORDER — HEPARIN SOD (PORK) LOCK FLUSH 100 UNIT/ML IV SOLN
500.0000 [IU] | Freq: Once | INTRAVENOUS | Status: AC | PRN
  Administered 2022-05-28: 500 [IU]

## 2022-05-28 MED ORDER — SODIUM CHLORIDE 0.9 % IV SOLN
10.0000 mg | Freq: Once | INTRAVENOUS | Status: AC
  Administered 2022-05-28: 10 mg via INTRAVENOUS
  Filled 2022-05-28: qty 10

## 2022-05-28 MED ORDER — FAMOTIDINE IN NACL 20-0.9 MG/50ML-% IV SOLN
20.0000 mg | Freq: Once | INTRAVENOUS | Status: AC
  Administered 2022-05-28: 20 mg via INTRAVENOUS
  Filled 2022-05-28: qty 50

## 2022-05-28 MED ORDER — DIPHENHYDRAMINE HCL 50 MG/ML IJ SOLN
50.0000 mg | Freq: Once | INTRAMUSCULAR | Status: AC
  Administered 2022-05-28: 50 mg via INTRAVENOUS
  Filled 2022-05-28: qty 1

## 2022-05-28 MED ORDER — SODIUM CHLORIDE 0.9% FLUSH
10.0000 mL | INTRAVENOUS | Status: DC | PRN
  Administered 2022-05-28: 10 mL

## 2022-05-28 NOTE — Assessment & Plan Note (Signed)
Metastatic Breast Cancer: ER Pos and Her 2 Neg (previously progressed on Ibrance, fulvestrant, everolimus, alpelisib, Xeloda) Prior Treatment: Taxol 09/10/2020-06/18/21 Chronic right arm lymphedema IHC: HER2 0 ----------------------------------------------------------------------------------------------------------------------- Current treatment: Palliative treatment with Sacituzumab-Govitecan, today cycle 16   Toxicities: 1.  Neutropenia: We reduced the dosage of Trodelvy with cycle 2 2. Fatigue and joint stiffness: There appears to be more cumulative fatigue 3.  Chemotherapy-induced anemia today's hemoglobin is improving and today it is 11.1 4.  Nausea: Takes nausea medication 5. Constipation alternating with diarrhea 6.  Dryness of the face and acne 7.  Blurring of vision towards the end of the day: Possibly due to exhaustion.  Encouraged her to take more rest through the day.  She works full-time and stays very busy.    CT CAP 04/14/2022: Similar subpleural pulmonary nodules.  Decrease in mediastinal lymph node, stable bone metastases, regression/resolution of the central liver lesion, cirrhosis and hepatic steatosis   Return to clinic for Trodelvy (days 1 and 8) and follow-ups every 3 weeks

## 2022-05-28 NOTE — Progress Notes (Signed)
Patient Care Team: Kelton Pillar, MD as PCP - General (Family Medicine) Jacelyn Pi, MD as Consulting Physician (Endocrinology) Jovita Kussmaul, MD as Consulting Physician (General Surgery) [name], MD as Attending Physician (Radiation Oncology) Belva Crome, MD as Consulting Physician (Cardiology) [name] [name], NP as Nurse Practitioner (Hematology and Oncology) Lajoyce Lauber, DMD (Dentistry) Raina Mina, RPH-CPP (Pharmacist) Nicholas Lose, MD as Consulting Physician (Hematology and Oncology)  DIAGNOSIS:  Encounter Diagnoses  Name Primary?   Malignant neoplasm of upper-inner quadrant of right breast in female, estrogen receptor positive ([place]) Yes   Malignant neoplasm metastatic to liver [facility])    Malignant neoplasm metastatic to lung, unspecified laterality ([place])    Malignant neoplasm of central portion of both breasts in female, estrogen receptor positive ([place])     SUMMARY OF ONCOLOGIC HISTORY: Oncology History  Malignant neoplasm of upper-inner quadrant of right breast in female, estrogen receptor positive ([place])  10/15/2015 Initial Biopsy   Right breast upper inner quadrant biopsy: IDC, DCIS, grade 2, ER+(90%), PR+(50%), Ki67 70%, HER-2 negative (ratio 1.18).   10/17/2015 Initial Diagnosis   Malignant neoplasm of upper-inner quadrant of right breast in female, estrogen receptor positive ([place])   10/2015 -  Anti-estrogen oral therapy   Tamoxifen daily   11/28/2015 Surgery   Left breast mammoplasty: IDC, grade 1, 1.2cm, DCIS, lobular neoplasia, T1c, Nx, ER 80%, PR60%, Ki67 3% HER-2 neg Right breast lumpectomy: IDC, grade 1, 2.3cm, DCIS. T2N1a ER/PR+HER-2 neg, Ki 70%.   11/28/2015 Miscellaneous   Mammaprint was Low Risk. Predicting a 98% chance of no disease recurrence within 5 years with anti-estrogens as the only systemic therapy.  It also predicts minimal to no benefit from chemotherapy.     12/26/2015 Surgery   Bilateral mastectomies: bilateral  sentinel node sampling, Right: no residual carcinoma, left, focal DCIS, 4 SLN negative   02/11/2016 - 03/27/2016 Radiation Therapy   Adjuvant radiation [name]): 1) Right chest wall: 50.4 Gy in 28 fractions  2) Right chest wall boost: 10 Gy in 5 fractions   08/04/2017 Relapse/Recurrence   08/04/2017, MRI of the liver and total spine MRI 08/11/2017 showed liver and bone metastasis; liver biopsy 09/07/2017 confirms estrogen receptor positive, progesterone receptor negative, HER-2 negative metastatic breast cancer; chest CT scan 08/13/2017 shows multiple bilateral pulmonary nodules.   08/17/2017 - 01/04/2018 Anti-estrogen oral therapy   fulvestrant  palbociclib started 08/31/2017 discontinued 01/04/2018 with evidence of progression in the liver   08/2017 Miscellaneous   [facility] report on liver biopsy from April 2019 shows a negative PDL 1, stable MSI and proficient mismatch repair status.  The tumor is positive for the androgen receptor, and for PTEN for PIK3 CA mutations.  HER-2/neu was read as equivocal (it was negative by FISH on the pathology report).   01/04/2018 Miscellaneous   anastrozole started 01/04/2018 everolimus started 01/29/2018   02/16/2018 - 03/01/2018 Radiation Therapy   01/28/18 prophylactic left femur intramedullary nail surgery for impending left femur pathologic fracture alliative radiation with Dr. [name] to the left femur and left ilium from 02/16/2018 to 03/01/2018   01/30/2020 - 05/2020 Anti-estrogen oral therapy   alpelisib started 01/30/2020 at 300 mg daily with Metformin 500 mg daily and Faslodex (stopped for progression)   05/22/2020 - 08/2020 Chemotherapy   Xeloda discontinued for progression   09/10/2020 - 06/18/2021 Chemotherapy   Patient is on Treatment Plan : BREAST Paclitaxel D1,D15 q28d     07/17/2021 - 12/18/2021 Chemotherapy   Patient is on Treatment Plan : BREAST  METASTATIC Sacituzumab govitecan-hziy [name]) q21d     01/01/2022 -  Chemotherapy   Patient is on  Treatment Plan : BREAST METASTATIC Sacituzumab govitecan-hziy [name]) D1,8 q21d     Bilateral malignant neoplasm of breast in female, estrogen receptor positive ([place])  12/26/2015 Initial Diagnosis   Bilateral malignant neoplasm of breast in female, estrogen receptor positive ([place])   07/17/2021 - 12/18/2021 Chemotherapy   Patient is on Treatment Plan : BREAST METASTATIC Sacituzumab govitecan-hziy [name]) q21d     01/01/2022 -  Chemotherapy   Patient is on Treatment Plan : BREAST METASTATIC Sacituzumab govitecan-hziy [name]) D1,8 q21d     Malignant neoplasm metastatic to liver ([place])  08/13/2017 Initial Diagnosis   Liver metastases ([place])   07/17/2021 - 12/18/2021 Chemotherapy   Patient is on Treatment Plan : BREAST METASTATIC Sacituzumab govitecan-hziy [name]) q21d     01/01/2022 -  Chemotherapy   Patient is on Treatment Plan : BREAST METASTATIC Sacituzumab govitecan-hziy [name]) D1,8 q21d     Malignant neoplasm metastatic to lung ([place])  09/10/2020 Initial Diagnosis   Lung metastases ([place])   07/17/2021 - 12/18/2021 Chemotherapy   Patient is on Treatment Plan : BREAST METASTATIC Sacituzumab govitecan-hziy [name]) q21d     01/01/2022 -  Chemotherapy   Patient is on Treatment Plan : BREAST METASTATIC Sacituzumab govitecan-hziy [name]) D1,8 q21d       CHIEF COMPLIANT: Follow-up metastatic breast cancer     INTERVAL HISTORY: Debbie [name] is a 57 year old with above-mentioned history of metastatic breast cancer currently on cycle 16Sacituzumab-Govitecan,  She presents to the clinic for a follow-up. She reports constipation. She says she is taking Murelax and drinking water for relief. She say she is getting the blurry vision. Nausea has gotten a little better. She denies diarrhea.  She has been taking potasium medication.  She continues to have fatigue towards the end of the day.  Denies any significant nausea.  She is mildly nauseated but she is able to manage it with  lemon.   ALLERGIES:  is allergic to ace inhibitors, atorvastatin, and simvastatin.  MEDICATIONS:  Current Outpatient Medications  Medication Sig Dispense Refill   acetaminophen (TYLENOL) 500 MG tablet Take 1,000 mg by mouth every 6 (six) hours as needed.     Calcium-Magnesium 250-125 MG TABS Take 1 tablet by mouth daily. 120 each 4   cetirizine (ZYRTEC) 10 MG tablet Take 10 mg by mouth daily as needed for allergies.      chlorhexidine (PERIDEX) 0.12 % solution Use 5 mLs in the mouth or throat 2 (two) times daily as needed as directed. *Do not swallow* 473 mL 6   chlorhexidine (PERIDEX) 0.12 % solution Swish using 3ms for 2 full minutes then spit. do this twice daily 473 mL 0   cyanocobalamin (VITAMIN B12) 1000 MCG tablet Take 1,000 mcg by mouth daily.     diphenoxylate-atropine (LOMOTIL) 2.5-0.025 MG tablet Take 1 tablet by mouth 4 (four) times daily as needed for diarrhea or loose stools. 30 tablet 2   flurbiprofen (ANSAID) 100 MG tablet Take by mouth.     furosemide (LASIX) 20 MG tablet Take 1 tablet (20 mg total) by mouth daily. 90 tablet 6   glucose blood (FREESTYLE LITE) test strip Use 1 strip to check blood sugar 3 (three) times daily. 50 strip 3   levothyroxine (SYNTHROID) 175 MCG tablet Take 1 tablet (175 mcg total) by mouth in the morning on an empty stomach 90 tablet 1   losartan-hydrochlorothiazide (HYZAAR) 100-25 MG tablet Take  1 tablet by mouth daily. 30 tablet 11   losartan-hydrochlorothiazide (HYZAAR) 100-25 MG tablet Take 1 tablet by mouth daily. 30 tablet 11   metFORMIN (GLUCOPHAGE-XR) 500 MG 24 hr tablet Take 1 tablet (500 mg total) by mouth with evening meal 90 tablet 0   Multiple Vitamin (MULTIVITAMIN) tablet Take 1 tablet by mouth daily.     omega-3 acid ethyl esters (LOVAZA) 1 g capsule Take 1 g by mouth 2 (two) times daily.      omeprazole (PRILOSEC) 40 MG capsule Take 1 capsule (40 mg total) by mouth daily. 30 capsule 3   ondansetron (ZOFRAN-ODT) 8 MG disintegrating  tablet Dissolve 1 tablet (8 mg total) by mouth every 8 (eight) hours as needed for nausea or vomiting. 20 tablet 0   oxyCODONE (OXY IR/ROXICODONE) 5 MG immediate release tablet Take 1 tablet (5 mg total) by mouth every 4 (four) hours as needed for severe pain. 60 tablet 0   oxyCODONE-acetaminophen (PERCOCET/ROXICET) 5-325 MG tablet Take 1 tablet by mouth every 8 (eight) hours as needed for severe pain. 20 tablet 0   potassium chloride (KLOR-CON) 10 MEQ tablet Take 2 tablets (20 mEq total) by mouth daily. 60 tablet 1   pregabalin (LYRICA) 100 MG capsule Take 1 capsule by mouth 3 times daily. 90 capsule 3   rosuvastatin (CRESTOR) 5 MG tablet Take 1 tablet (5 mg total) by mouth daily. 90 tablet 1   sertraline (ZOLOFT) 100 MG tablet Take 1 tablet (100 mg total) by mouth daily. 90 tablet 3   Vitamin D, Cholecalciferol, 1000 units CAPS Take 1 tablet by mouth daily. (Patient taking differently: Take 1,000 Units by mouth daily.) 90 capsule 4   No current facility-administered medications for this visit.    PHYSICAL EXAMINATION: ECOG PERFORMANCE STATUS: 1 - Symptomatic but completely ambulatory  Vitals:   05/28/22 0809  BP: 138/73  Pulse: 87  Resp: 18  Temp: 97.7 F (36.5 C)  SpO2: 98%   Filed Weights   05/28/22 0809  Weight: 295 lb 1 oz (133.8 kg)      LABORATORY DATA:  I have reviewed the data as listed    Latest Ref Rng & Units 05/28/2022    7:26 AM 05/14/2022    7:46 AM 05/07/2022    7:49 AM  CMP  Glucose 70 - 99 mg/dL 121  117  129   BUN 6 - 20 mg/dL '14  12  14   '$ Creatinine 0.44 - 1.00 mg/dL 1.15  0.85  0.67   Sodium 135 - 145 mmol/L 140  140  142   Potassium 3.5 - 5.1 mmol/L 3.2  3.5  3.1   Chloride 98 - 111 mmol/L 102  103  103   CO2 22 - 32 mmol/L 31  31  32   Calcium 8.9 - 10.3 mg/dL 9.5  9.2  9.1   Total Protein 6.5 - 8.1 g/dL 6.5  6.4  6.7   Total Bilirubin 0.3 - 1.2 mg/dL 0.4  0.4  0.4   Alkaline Phos 38 - 126 U/L 226  224  202   AST 15 - 41 U/L 41  41  33   ALT 0 -  44 U/L 57  49  43     Lab Results  Component Value Date   WBC 4.9 05/28/2022   HGB 11.1 (L) 05/28/2022   HCT 33.4 (L) 05/28/2022   MCV 91.5 05/28/2022   PLT 242 05/28/2022   NEUTROABS 3.5 05/28/2022    ASSESSMENT &  PLAN:  Malignant neoplasm of upper-inner quadrant of right breast in female, estrogen receptor positive ([place]) Metastatic Breast Cancer: ER Pos and Her 2 Neg (previously progressed on Ibrance, fulvestrant, everolimus, alpelisib, Xeloda) Prior Treatment: Taxol 09/10/2020-06/18/21 Chronic right arm lymphedema IHC: HER2 0 ----------------------------------------------------------------------------------------------------------------------- Current treatment: Palliative treatment with Sacituzumab-Govitecan, today cycle 16   Toxicities: 1.  Neutropenia: We reduced the dosage of Trodelvy with cycle 2 2. Fatigue and joint stiffness: There appears to be more cumulative fatigue 3.  Chemotherapy-induced anemia today's hemoglobin is improving and today it is 11.1 4.  Nausea: Takes nausea medication 5. Constipation alternating with diarrhea 6.  Dryness of the face and acne 7.  Blurring of vision towards the end of the day: Possibly due to exhaustion.  Encouraged her to take more rest through the day.  She works full-time and stays very busy.    CT CAP 04/14/2022: Similar subpleural pulmonary nodules.  Decrease in mediastinal lymph node, stable bone metastases, regression/resolution of the central liver lesion, cirrhosis and hepatic steatosis   Return to clinic for Trodelvy (days 1 and 8) and follow-ups every 3 weeks   No orders of the defined types were placed in this encounter.  The patient has a good understanding of the overall plan. she agrees with it. she will call with any problems that may develop before the next visit here. Total time spent: 30 mins including face to face time and time spent for planning, charting and co-ordination of care   Harriette Ohara,  MD 05/28/22    I Gardiner Coins am acting as a Education administrator for [employer]  I have reviewed the above documentation for accuracy and completeness, and I agree with the above.

## 2022-05-28 NOTE — Patient Instructions (Signed)
[facility]  Discharge Instructions: Thank you for choosing [facility] to provide your oncology and hematology care.   If you have a lab appointment with the [facility], please go directly to the [facility] and check in at the registration area.   Wear comfortable clothing and clothing appropriate for easy access to any Portacath or PICC line.   We strive to give you quality time with your provider. You may need to reschedule your appointment if you arrive late (15 or more minutes).  Arriving late affects you and other patients whose appointments are after yours.  Also, if you miss three or more appointments without notifying the office, you may be dismissed from the clinic at the provider's discretion.      For prescription refill requests, have your pharmacy contact our office and allow 72 hours for refills to be completed.    Today you received the following chemotherapy and/or immunotherapy agents: trodelvy       To help prevent nausea and vomiting after your treatment, we encourage you to take your nausea medication as directed.  BELOW ARE SYMPTOMS THAT SHOULD BE REPORTED IMMEDIATELY: *FEVER GREATER THAN 100.4 F (38 C) OR HIGHER *CHILLS OR SWEATING *NAUSEA AND VOMITING THAT IS NOT CONTROLLED WITH YOUR NAUSEA MEDICATION *UNUSUAL SHORTNESS OF BREATH *UNUSUAL BRUISING OR BLEEDING *URINARY PROBLEMS (pain or burning when urinating, or frequent urination) *BOWEL PROBLEMS (unusual diarrhea, constipation, pain near the anus) TENDERNESS IN MOUTH AND THROAT WITH OR WITHOUT PRESENCE OF ULCERS (sore throat, sores in mouth, or a toothache) UNUSUAL RASH, SWELLING OR PAIN  UNUSUAL VAGINAL DISCHARGE OR ITCHING   Items with * indicate a potential emergency and should be followed up as soon as possible or go to the Emergency Department if any problems should occur.  Please show the CHEMOTHERAPY ALERT CARD or IMMUNOTHERAPY ALERT CARD at check-in to  the Emergency Department and triage nurse.  Should you have questions after your visit or need to cancel or reschedule your appointment, please contact [facility]  Dept: [phone]  and follow the prompts.  Office hours are 8:00 a.m. to 4:30 p.m. Monday - Friday. Please note that voicemails left after 4:00 p.m. may not be returned until the following business day.  We are closed weekends and major holidays. You have access to a nurse at all times for urgent questions. Please call the main number to the clinic Dept: [phone] and follow the prompts.   For any non-urgent questions, you may also contact your provider using MyChart. We now offer e-Visits for anyone 75 and older to request care online for non-urgent symptoms. For details visit [email or web address].   Also download the MyChart app! Go to the app store, search "MyChart", open the app, select [facility], and log in with your MyChart username and password.

## 2022-05-28 NOTE — Progress Notes (Signed)
Pt declined to stay for post 30 min obs, ambulatory to lobby with VSS upon discharge

## 2022-05-29 ENCOUNTER — Other Ambulatory Visit: Payer: Self-pay

## 2022-06-02 ENCOUNTER — Other Ambulatory Visit: Payer: Self-pay

## 2022-06-03 MED FILL — Dexamethasone Sodium Phosphate Inj 100 MG/10ML: INTRAMUSCULAR | Qty: 1 | Status: AC

## 2022-06-03 MED FILL — Fosaprepitant Dimeglumine For IV Infusion 150 MG (Base Eq): INTRAVENOUS | Qty: 5 | Status: AC

## 2022-06-04 ENCOUNTER — Ambulatory Visit: Payer: Self-pay | Admitting: Hematology and Oncology

## 2022-06-04 ENCOUNTER — Inpatient Hospital Stay: Payer: Commercial Managed Care - PPO

## 2022-06-04 ENCOUNTER — Other Ambulatory Visit: Payer: Self-pay

## 2022-06-04 VITALS — BP 142/66 | HR 90 | Temp 99.0°F | Resp 18 | Wt 297.2 lb

## 2022-06-04 DIAGNOSIS — C787 Secondary malignant neoplasm of liver and intrahepatic bile duct: Secondary | ICD-10-CM

## 2022-06-04 DIAGNOSIS — Z5112 Encounter for antineoplastic immunotherapy: Secondary | ICD-10-CM | POA: Diagnosis not present

## 2022-06-04 DIAGNOSIS — Z170 Estrogen receptor positive status [ER+]: Secondary | ICD-10-CM

## 2022-06-04 DIAGNOSIS — Z95828 Presence of other vascular implants and grafts: Secondary | ICD-10-CM

## 2022-06-04 DIAGNOSIS — C7800 Secondary malignant neoplasm of unspecified lung: Secondary | ICD-10-CM

## 2022-06-04 DIAGNOSIS — C50211 Malignant neoplasm of upper-inner quadrant of right female breast: Secondary | ICD-10-CM

## 2022-06-04 LAB — CBC WITH DIFFERENTIAL (CANCER CENTER ONLY)
Abs Immature Granulocytes: 0.04 10*3/uL (ref 0.00–0.07)
Basophils Absolute: 0 10*3/uL (ref 0.0–0.1)
Basophils Relative: 1 %
Eosinophils Absolute: 0.1 10*3/uL (ref 0.0–0.5)
Eosinophils Relative: 3 %
HCT: 32.4 % — ABNORMAL LOW (ref 36.0–46.0)
Hemoglobin: 10.6 g/dL — ABNORMAL LOW (ref 12.0–15.0)
Immature Granulocytes: 1 %
Lymphocytes Relative: 22 %
Lymphs Abs: 0.7 10*3/uL (ref 0.7–4.0)
MCH: 29.9 pg (ref 26.0–34.0)
MCHC: 32.7 g/dL (ref 30.0–36.0)
MCV: 91.3 fL (ref 80.0–100.0)
Monocytes Absolute: 0.5 10*3/uL (ref 0.1–1.0)
Monocytes Relative: 15 %
Neutro Abs: 1.9 10*3/uL (ref 1.7–7.7)
Neutrophils Relative %: 58 %
Platelet Count: 244 10*3/uL (ref 150–400)
RBC: 3.55 MIL/uL — ABNORMAL LOW (ref 3.87–5.11)
RDW: 18.4 % — ABNORMAL HIGH (ref 11.5–15.5)
WBC Count: 3.2 10*3/uL — ABNORMAL LOW (ref 4.0–10.5)
nRBC: 0 % (ref 0.0–0.2)

## 2022-06-04 LAB — CMP (CANCER CENTER ONLY)
ALT: 49 U/L — ABNORMAL HIGH (ref 0–44)
AST: 44 U/L — ABNORMAL HIGH (ref 15–41)
Albumin: 3.4 g/dL — ABNORMAL LOW (ref 3.5–5.0)
Alkaline Phosphatase: 238 U/L — ABNORMAL HIGH (ref 38–126)
Anion gap: 5 (ref 5–15)
BUN: 14 mg/dL (ref 6–20)
CO2: 32 mmol/L (ref 22–32)
Calcium: 9 mg/dL (ref 8.9–10.3)
Chloride: 103 mmol/L (ref 98–111)
Creatinine: 0.87 mg/dL (ref 0.44–1.00)
GFR, Estimated: >60 mL/min (ref >60)
Glucose, Bld: 111 mg/dL — ABNORMAL HIGH (ref 70–99)
Potassium: 3.4 mmol/L — ABNORMAL LOW (ref 3.5–5.1)
Sodium: 140 mmol/L (ref 135–145)
Total Bilirubin: 0.3 mg/dL (ref 0.3–1.2)
Total Protein: 6.8 g/dL (ref 6.5–8.1)

## 2022-06-04 LAB — PHOSPHORUS: Phosphorus: 2.9 mg/dL (ref 2.5–4.6)

## 2022-06-04 LAB — MAGNESIUM: Magnesium: 1.9 mg/dL (ref 1.7–2.4)

## 2022-06-04 MED ORDER — ACETAMINOPHEN 325 MG PO TABS
650.0000 mg | ORAL_TABLET | Freq: Once | ORAL | Status: AC
  Administered 2022-06-04: 650 mg via ORAL
  Filled 2022-06-04: qty 2

## 2022-06-04 MED ORDER — SODIUM CHLORIDE 0.9 % IV SOLN: Freq: Once | INTRAVENOUS | Status: AC

## 2022-06-04 MED ORDER — SODIUM CHLORIDE 0.9% FLUSH
10.0000 mL | INTRAVENOUS | Status: DC | PRN
  Administered 2022-06-04: 10 mL

## 2022-06-04 MED ORDER — SODIUM CHLORIDE 0.9% FLUSH
10.0000 mL | Freq: Once | INTRAVENOUS | Status: AC
  Administered 2022-06-04: 10 mL

## 2022-06-04 MED ORDER — FAMOTIDINE IN NACL 20-0.9 MG/50ML-% IV SOLN
20.0000 mg | Freq: Once | INTRAVENOUS | Status: AC
  Administered 2022-06-04: 20 mg via INTRAVENOUS
  Filled 2022-06-04: qty 50

## 2022-06-04 MED ORDER — SODIUM CHLORIDE 0.9 % IV SOLN
10.0000 mg | Freq: Once | INTRAVENOUS | Status: AC
  Administered 2022-06-04: 10 mg via INTRAVENOUS
  Filled 2022-06-04: qty 10

## 2022-06-04 MED ORDER — PALONOSETRON HCL INJECTION 0.25 MG/5ML
0.2500 mg | Freq: Once | INTRAVENOUS | Status: AC
  Administered 2022-06-04: 0.25 mg via INTRAVENOUS
  Filled 2022-06-04: qty 5

## 2022-06-04 MED ORDER — SODIUM CHLORIDE 0.9 % IV SOLN
6.0000 mg/kg | Freq: Once | INTRAVENOUS | Status: AC
  Administered 2022-06-04: 790 mg via INTRAVENOUS
  Filled 2022-06-04: qty 79

## 2022-06-04 MED ORDER — HEPARIN SOD (PORK) LOCK FLUSH 100 UNIT/ML IV SOLN
500.0000 [IU] | Freq: Once | INTRAVENOUS | Status: AC | PRN
  Administered 2022-06-04: 500 [IU]

## 2022-06-04 MED ORDER — SODIUM CHLORIDE 0.9 % IV SOLN
150.0000 mg | Freq: Once | INTRAVENOUS | Status: AC
  Administered 2022-06-04: 150 mg via INTRAVENOUS
  Filled 2022-06-04: qty 150

## 2022-06-04 MED ORDER — DIPHENHYDRAMINE HCL 50 MG/ML IJ SOLN
50.0000 mg | Freq: Once | INTRAMUSCULAR | Status: AC
  Administered 2022-06-04: 50 mg via INTRAVENOUS
  Filled 2022-06-04: qty 1

## 2022-06-04 NOTE — Patient Instructions (Signed)
[facility]  Discharge Instructions: Thank you for choosing [facility] to provide your oncology and hematology care.   If you have a lab appointment with the Cancer Center, please go directly to the Cancer Center and check in at the registration area.   Wear comfortable clothing and clothing appropriate for easy access to any Portacath or PICC line.   We strive to give you quality time with your provider. You may need to reschedule your appointment if you arrive late (15 or more minutes).  Arriving late affects you and other patients whose appointments are after yours.  Also, if you miss three or more appointments without notifying the office, you may be dismissed from the clinic at the provider's discretion.      For prescription refill requests, have your pharmacy contact our office and allow 72 hours for refills to be completed.    Today you received the following chemotherapy and/or immunotherapy agents trodelvy      To help prevent nausea and vomiting after your treatment, we encourage you to take your nausea medication as directed.  BELOW ARE SYMPTOMS THAT SHOULD BE REPORTED IMMEDIATELY: *FEVER GREATER THAN 100.4 F (38 C) OR HIGHER *CHILLS OR SWEATING *NAUSEA AND VOMITING THAT IS NOT CONTROLLED WITH YOUR NAUSEA MEDICATION *UNUSUAL SHORTNESS OF BREATH *UNUSUAL BRUISING OR BLEEDING *URINARY PROBLEMS (pain or burning when urinating, or frequent urination) *BOWEL PROBLEMS (unusual diarrhea, constipation, pain near the anus) TENDERNESS IN MOUTH AND THROAT WITH OR WITHOUT PRESENCE OF ULCERS (sore throat, sores in mouth, or a toothache) UNUSUAL RASH, SWELLING OR PAIN  UNUSUAL VAGINAL DISCHARGE OR ITCHING   Items with * indicate a potential emergency and should be followed up as soon as possible or go to the Emergency Department if any problems should occur.  Please show the CHEMOTHERAPY ALERT CARD or IMMUNOTHERAPY ALERT CARD at  check-in to the Emergency Department and triage nurse.  Should you have questions after your visit or need to cancel or reschedule your appointment, please contact [facility]  Dept: [phone]  and follow the prompts.  Office hours are 8:00 a.m. to 4:30 p.m. Monday - Friday. Please note that voicemails left after 4:00 p.m. may not be returned until the following business day.  We are closed weekends and major holidays. You have access to a nurse at all times for urgent questions. Please call the main number to the clinic Dept: [phone] and follow the prompts.   For any non-urgent questions, you may also contact your provider using MyChart. We now offer e-Visits for anyone 18 and older to request care online for non-urgent symptoms. For details visit [email or web address].   Also download the MyChart app! Go to the app store, search "MyChart", open the app, select [facility], and log in with your MyChart username and password.   

## 2022-06-11 ENCOUNTER — Other Ambulatory Visit (HOSPITAL_COMMUNITY): Payer: Self-pay

## 2022-06-16 ENCOUNTER — Other Ambulatory Visit: Payer: Self-pay

## 2022-06-16 ENCOUNTER — Other Ambulatory Visit (HOSPITAL_COMMUNITY): Payer: Self-pay

## 2022-06-17 MED FILL — Fosaprepitant Dimeglumine For IV Infusion 150 MG (Base Eq): INTRAVENOUS | Qty: 5 | Status: AC

## 2022-06-17 MED FILL — Dexamethasone Sodium Phosphate Inj 100 MG/10ML: INTRAMUSCULAR | Qty: 1 | Status: AC

## 2022-06-18 ENCOUNTER — Encounter: Payer: Self-pay | Admitting: *Deleted

## 2022-06-18 ENCOUNTER — Inpatient Hospital Stay: Payer: Commercial Managed Care - PPO

## 2022-06-18 ENCOUNTER — Inpatient Hospital Stay: Payer: Commercial Managed Care - PPO | Attending: Oncology

## 2022-06-18 VITALS — BP 124/69 | HR 94 | Temp 98.3°F | Resp 20 | Wt 298.2 lb

## 2022-06-18 DIAGNOSIS — Z79810 Long term (current) use of selective estrogen receptor modulators (SERMs): Secondary | ICD-10-CM | POA: Insufficient documentation

## 2022-06-18 DIAGNOSIS — Z170 Estrogen receptor positive status [ER+]: Secondary | ICD-10-CM | POA: Insufficient documentation

## 2022-06-18 DIAGNOSIS — C787 Secondary malignant neoplasm of liver and intrahepatic bile duct: Secondary | ICD-10-CM | POA: Diagnosis not present

## 2022-06-18 DIAGNOSIS — C50211 Malignant neoplasm of upper-inner quadrant of right female breast: Secondary | ICD-10-CM | POA: Diagnosis not present

## 2022-06-18 DIAGNOSIS — C7800 Secondary malignant neoplasm of unspecified lung: Secondary | ICD-10-CM

## 2022-06-18 DIAGNOSIS — Z5112 Encounter for antineoplastic immunotherapy: Secondary | ICD-10-CM | POA: Insufficient documentation

## 2022-06-18 DIAGNOSIS — I890 Lymphedema, not elsewhere classified: Secondary | ICD-10-CM | POA: Insufficient documentation

## 2022-06-18 DIAGNOSIS — Z9013 Acquired absence of bilateral breasts and nipples: Secondary | ICD-10-CM | POA: Insufficient documentation

## 2022-06-18 DIAGNOSIS — Z79899 Other long term (current) drug therapy: Secondary | ICD-10-CM | POA: Diagnosis not present

## 2022-06-18 DIAGNOSIS — Z95828 Presence of other vascular implants and grafts: Secondary | ICD-10-CM

## 2022-06-18 DIAGNOSIS — C7951 Secondary malignant neoplasm of bone: Secondary | ICD-10-CM | POA: Insufficient documentation

## 2022-06-18 LAB — CBC WITH DIFFERENTIAL (CANCER CENTER ONLY)
Abs Immature Granulocytes: 0.03 10*3/uL (ref 0.00–0.07)
Basophils Absolute: 0 10*3/uL (ref 0.0–0.1)
Basophils Relative: 1 %
Eosinophils Absolute: 0.1 10*3/uL (ref 0.0–0.5)
Eosinophils Relative: 2 %
HCT: 32.8 % — ABNORMAL LOW (ref 36.0–46.0)
Hemoglobin: 10.7 g/dL — ABNORMAL LOW (ref 12.0–15.0)
Immature Granulocytes: 1 %
Lymphocytes Relative: 12 %
Lymphs Abs: 0.6 10*3/uL — ABNORMAL LOW (ref 0.7–4.0)
MCH: 29.9 pg (ref 26.0–34.0)
MCHC: 32.6 g/dL (ref 30.0–36.0)
MCV: 91.6 fL (ref 80.0–100.0)
Monocytes Absolute: 0.5 10*3/uL (ref 0.1–1.0)
Monocytes Relative: 11 %
Neutro Abs: 3.3 10*3/uL (ref 1.7–7.7)
Neutrophils Relative %: 73 %
Platelet Count: 227 10*3/uL (ref 150–400)
RBC: 3.58 MIL/uL — ABNORMAL LOW (ref 3.87–5.11)
RDW: 18.6 % — ABNORMAL HIGH (ref 11.5–15.5)
WBC Count: 4.6 10*3/uL (ref 4.0–10.5)
nRBC: 0 % (ref 0.0–0.2)

## 2022-06-18 LAB — CMP (CANCER CENTER ONLY)
ALT: 52 U/L — ABNORMAL HIGH (ref 0–44)
AST: 41 U/L (ref 15–41)
Albumin: 3.4 g/dL — ABNORMAL LOW (ref 3.5–5.0)
Alkaline Phosphatase: 232 U/L — ABNORMAL HIGH (ref 38–126)
Anion gap: 7 (ref 5–15)
BUN: 13 mg/dL (ref 6–20)
CO2: 33 mmol/L — ABNORMAL HIGH (ref 22–32)
Calcium: 9.4 mg/dL (ref 8.9–10.3)
Chloride: 101 mmol/L (ref 98–111)
Creatinine: 0.82 mg/dL (ref 0.44–1.00)
GFR, Estimated: >60 mL/min (ref >60)
Glucose, Bld: 112 mg/dL — ABNORMAL HIGH (ref 70–99)
Potassium: 3.1 mmol/L — ABNORMAL LOW (ref 3.5–5.1)
Sodium: 141 mmol/L (ref 135–145)
Total Bilirubin: 0.4 mg/dL (ref 0.3–1.2)
Total Protein: 6.3 g/dL — ABNORMAL LOW (ref 6.5–8.1)

## 2022-06-18 LAB — PHOSPHORUS: Phosphorus: 4.7 mg/dL — ABNORMAL HIGH (ref 2.5–4.6)

## 2022-06-18 LAB — MAGNESIUM: Magnesium: 1.6 mg/dL — ABNORMAL LOW (ref 1.7–2.4)

## 2022-06-18 MED ORDER — SODIUM CHLORIDE 0.9 % IV SOLN
10.0000 mg | Freq: Once | INTRAVENOUS | Status: AC
  Administered 2022-06-18: 10 mg via INTRAVENOUS
  Filled 2022-06-18: qty 10

## 2022-06-18 MED ORDER — SODIUM CHLORIDE 0.9 % IV SOLN: Freq: Once | INTRAVENOUS | Status: AC

## 2022-06-18 MED ORDER — SODIUM CHLORIDE 0.9 % IV SOLN
150.0000 mg | Freq: Once | INTRAVENOUS | Status: AC
  Administered 2022-06-18: 150 mg via INTRAVENOUS
  Filled 2022-06-18: qty 150

## 2022-06-18 MED ORDER — SODIUM CHLORIDE 0.9% FLUSH
10.0000 mL | Freq: Once | INTRAVENOUS | Status: AC
  Administered 2022-06-18: 10 mL

## 2022-06-18 MED ORDER — SODIUM CHLORIDE 0.9 % IV SOLN
6.0000 mg/kg | Freq: Once | INTRAVENOUS | Status: AC
  Administered 2022-06-18: 720 mg via INTRAVENOUS
  Filled 2022-06-18: qty 72

## 2022-06-18 MED ORDER — DIPHENHYDRAMINE HCL 50 MG/ML IJ SOLN
50.0000 mg | Freq: Once | INTRAMUSCULAR | Status: AC
  Administered 2022-06-18: 50 mg via INTRAVENOUS
  Filled 2022-06-18: qty 1

## 2022-06-18 MED ORDER — PALONOSETRON HCL INJECTION 0.25 MG/5ML
0.2500 mg | Freq: Once | INTRAVENOUS | Status: AC
  Administered 2022-06-18: 0.25 mg via INTRAVENOUS
  Filled 2022-06-18: qty 5

## 2022-06-18 MED ORDER — ACETAMINOPHEN 325 MG PO TABS
650.0000 mg | ORAL_TABLET | Freq: Once | ORAL | Status: AC
  Administered 2022-06-18: 650 mg via ORAL
  Filled 2022-06-18: qty 2

## 2022-06-18 MED ORDER — HEPARIN SOD (PORK) LOCK FLUSH 100 UNIT/ML IV SOLN
500.0000 [IU] | Freq: Once | INTRAVENOUS | Status: AC | PRN
  Administered 2022-06-18: 500 [IU]

## 2022-06-18 MED ORDER — FAMOTIDINE IN NACL 20-0.9 MG/50ML-% IV SOLN
20.0000 mg | Freq: Once | INTRAVENOUS | Status: AC
  Administered 2022-06-18: 20 mg via INTRAVENOUS
  Filled 2022-06-18: qty 50

## 2022-06-18 MED ORDER — SODIUM CHLORIDE 0.9% FLUSH
10.0000 mL | INTRAVENOUS | Status: DC | PRN
  Administered 2022-06-18: 10 mL

## 2022-06-18 NOTE — Progress Notes (Signed)
Per NP pt Debbie [name] continues to be on hold at this time.  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

## 2022-06-18 NOTE — Progress Notes (Signed)
Continue Trodelvy '720mg'$  - dose rounded for vial size.  Raul Del [place], [place], BCPS, BCOP 06/18/2022 9:45 AM

## 2022-06-18 NOTE — Patient Instructions (Signed)
[facility]  Discharge Instructions: Thank you for choosing [facility] to provide your oncology and hematology care.   If you have a lab appointment with the Cancer Center, please go directly to the Cancer Center and check in at the registration area.   Wear comfortable clothing and clothing appropriate for easy access to any Portacath or PICC line.   We strive to give you quality time with your provider. You may need to reschedule your appointment if you arrive late (15 or more minutes).  Arriving late affects you and other patients whose appointments are after yours.  Also, if you miss three or more appointments without notifying the office, you may be dismissed from the clinic at the provider's discretion.      For prescription refill requests, have your pharmacy contact our office and allow 72 hours for refills to be completed.    Today you received the following chemotherapy and/or immunotherapy agents: Trodelvy      To help prevent nausea and vomiting after your treatment, we encourage you to take your nausea medication as directed.  BELOW ARE SYMPTOMS THAT SHOULD BE REPORTED IMMEDIATELY: *FEVER GREATER THAN 100.4 F (38 C) OR HIGHER *CHILLS OR SWEATING *NAUSEA AND VOMITING THAT IS NOT CONTROLLED WITH YOUR NAUSEA MEDICATION *UNUSUAL SHORTNESS OF BREATH *UNUSUAL BRUISING OR BLEEDING *URINARY PROBLEMS (pain or burning when urinating, or frequent urination) *BOWEL PROBLEMS (unusual diarrhea, constipation, pain near the anus) TENDERNESS IN MOUTH AND THROAT WITH OR WITHOUT PRESENCE OF ULCERS (sore throat, sores in mouth, or a toothache) UNUSUAL RASH, SWELLING OR PAIN  UNUSUAL VAGINAL DISCHARGE OR ITCHING   Items with * indicate a potential emergency and should be followed up as soon as possible or go to the Emergency Department if any problems should occur.  Please show the CHEMOTHERAPY ALERT CARD or IMMUNOTHERAPY ALERT CARD at  check-in to the Emergency Department and triage nurse.  Should you have questions after your visit or need to cancel or reschedule your appointment, please contact [facility]  Dept: [phone]  and follow the prompts.  Office hours are 8:00 a.m. to 4:30 p.m. Monday - Friday. Please note that voicemails left after 4:00 p.m. may not be returned until the following business day.  We are closed weekends and major holidays. You have access to a nurse at all times for urgent questions. Please call the main number to the clinic Dept: [phone] and follow the prompts.   For any non-urgent questions, you may also contact your provider using MyChart. We now offer e-Visits for anyone 18 and older to request care online for non-urgent symptoms. For details visit [email or web address].   Also download the MyChart app! Go to the app store, search "MyChart", open the app, select [facility], and log in with your MyChart username and password. 

## 2022-06-18 NOTE — Progress Notes (Signed)
OK to treat with Mg 1.6 and P pending per Wilber Bihari, NP.  Patient instructed to increase home K to three pills per day per [name], patient verbalized understanding.  Angie Fava, RN

## 2022-06-19 LAB — CANCER ANTIGEN 27.29: CA 27.29: 153.5 U/mL — ABNORMAL HIGH (ref 0.0–38.6)

## 2022-06-20 ENCOUNTER — Other Ambulatory Visit: Payer: Self-pay

## 2022-06-24 MED FILL — Fosaprepitant Dimeglumine For IV Infusion 150 MG (Base Eq): INTRAVENOUS | Qty: 5 | Status: AC

## 2022-06-24 MED FILL — Dexamethasone Sodium Phosphate Inj 100 MG/10ML: INTRAMUSCULAR | Qty: 1 | Status: AC

## 2022-06-24 NOTE — Progress Notes (Signed)
Patient Care Team: Kelton Pillar, MD as PCP - General (Family Medicine) Jacelyn Pi, MD as Consulting Physician (Endocrinology) Jovita Kussmaul, MD as Consulting Physician (General Surgery) [name], MD as Attending Physician (Radiation Oncology) Belva Crome, MD (Inactive) as Consulting Physician (Cardiology) [name] [name], NP as Nurse Practitioner (Hematology and Oncology) Lajoyce Lauber, DMD (Dentistry) Raina Mina, RPH-CPP (Pharmacist) Nicholas Lose, MD as Consulting Physician (Hematology and Oncology)  DIAGNOSIS: No diagnosis found.  SUMMARY OF ONCOLOGIC HISTORY: Oncology History  Malignant neoplasm of upper-inner quadrant of right breast in female, estrogen receptor positive ([place])  10/15/2015 Initial Biopsy   Right breast upper inner quadrant biopsy: IDC, DCIS, grade 2, ER+(90%), PR+(50%), Ki67 70%, HER-2 negative (ratio 1.18).   10/17/2015 Initial Diagnosis   Malignant neoplasm of upper-inner quadrant of right breast in female, estrogen receptor positive ([place])   10/2015 -  Anti-estrogen oral therapy   Tamoxifen daily   11/28/2015 Surgery   Left breast mammoplasty: IDC, grade 1, 1.2cm, DCIS, lobular neoplasia, T1c, Nx, ER 80%, PR60%, Ki67 3% HER-2 neg Right breast lumpectomy: IDC, grade 1, 2.3cm, DCIS. T2N1a ER/PR+HER-2 neg, Ki 70%.   11/28/2015 Miscellaneous   Mammaprint was Low Risk. Predicting a 98% chance of no disease recurrence within 5 years with anti-estrogens as the only systemic therapy.  It also predicts minimal to no benefit from chemotherapy.     12/26/2015 Surgery   Bilateral mastectomies: bilateral sentinel node sampling, Right: no residual carcinoma, left, focal DCIS, 4 SLN negative   02/11/2016 - 03/27/2016 Radiation Therapy   Adjuvant radiation [name]): 1) Right chest wall: 50.4 Gy in 28 fractions  2) Right chest wall boost: 10 Gy in 5 fractions   08/04/2017 Relapse/Recurrence   08/04/2017, MRI of the liver and total spine MRI  08/11/2017 showed liver and bone metastasis; liver biopsy 09/07/2017 confirms estrogen receptor positive, progesterone receptor negative, HER-2 negative metastatic breast cancer; chest CT scan 08/13/2017 shows multiple bilateral pulmonary nodules.   08/17/2017 - 01/04/2018 Anti-estrogen oral therapy   fulvestrant  palbociclib started 08/31/2017 discontinued 01/04/2018 with evidence of progression in the liver   08/2017 Miscellaneous   [facility] report on liver biopsy from April 2019 shows a negative PDL 1, stable MSI and proficient mismatch repair status.  The tumor is positive for the androgen receptor, and for PTEN for PIK3 CA mutations.  HER-2/neu was read as equivocal (it was negative by FISH on the pathology report).   01/04/2018 Miscellaneous   anastrozole started 01/04/2018 everolimus started 01/29/2018   02/16/2018 - 03/01/2018 Radiation Therapy   01/28/18 prophylactic left femur intramedullary nail surgery for impending left femur pathologic fracture alliative radiation with Dr. [name] to the left femur and left ilium from 02/16/2018 to 03/01/2018   01/30/2020 - 05/2020 Anti-estrogen oral therapy   alpelisib started 01/30/2020 at 300 mg daily with Metformin 500 mg daily and Faslodex (stopped for progression)   05/22/2020 - 08/2020 Chemotherapy   Xeloda discontinued for progression   09/10/2020 - 06/18/2021 Chemotherapy   Patient is on Treatment Plan : BREAST Paclitaxel D1,D15 q28d     07/17/2021 - 12/18/2021 Chemotherapy   Patient is on Treatment Plan : BREAST METASTATIC Sacituzumab govitecan-hziy [name]) q21d     01/01/2022 -  Chemotherapy   Patient is on Treatment Plan : BREAST METASTATIC Sacituzumab govitecan-hziy [name]) D1,8 q21d     Bilateral malignant neoplasm of breast in female, estrogen receptor positive ([place])  12/26/2015 Initial Diagnosis   Bilateral malignant neoplasm of breast in female, estrogen receptor  positive ([place])   07/17/2021 - 12/18/2021 Chemotherapy   Patient is on  Treatment Plan : BREAST METASTATIC Sacituzumab govitecan-hziy [name]) q21d     01/01/2022 -  Chemotherapy   Patient is on Treatment Plan : BREAST METASTATIC Sacituzumab govitecan-hziy [name]) D1,8 q21d     Malignant neoplasm metastatic to liver ([place])  08/13/2017 Initial Diagnosis   Liver metastases ([place])   07/17/2021 - 12/18/2021 Chemotherapy   Patient is on Treatment Plan : BREAST METASTATIC Sacituzumab govitecan-hziy [name]) q21d     01/01/2022 -  Chemotherapy   Patient is on Treatment Plan : BREAST METASTATIC Sacituzumab govitecan-hziy [name]) D1,8 q21d     Malignant neoplasm metastatic to lung ([place])  09/10/2020 Initial Diagnosis   Lung metastases ([place])   07/17/2021 - 12/18/2021 Chemotherapy   Patient is on Treatment Plan : BREAST METASTATIC Sacituzumab govitecan-hziy [name]) q21d     01/01/2022 -  Chemotherapy   Patient is on Treatment Plan : BREAST METASTATIC Sacituzumab govitecan-hziy [name]) D1,8 q21d       CHIEF COMPLIANT: Follow-up metastatic breast cancer      INTERVAL HISTORY: Debbie [name] is a 57 year old with above-mentioned history of metastatic breast cancer currently on cycle 16Sacituzumab-Govitecan,  She presents to the clinic for a follow-up.    ALLERGIES:  is allergic to ace inhibitors, atorvastatin, and simvastatin.  MEDICATIONS:  Current Outpatient Medications  Medication Sig Dispense Refill   acetaminophen (TYLENOL) 500 MG tablet Take 1,000 mg by mouth every 6 (six) hours as needed.     Calcium-Magnesium 250-125 MG TABS Take 1 tablet by mouth daily. 120 each 4   cetirizine (ZYRTEC) 10 MG tablet Take 10 mg by mouth daily as needed for allergies.      chlorhexidine (PERIDEX) 0.12 % solution Use 5 mLs in the mouth or throat 2 (two) times daily as needed as directed. ****Do not swallow**** 473 mL 6   chlorhexidine (PERIDEX) 0.12 % solution Swish using 55ms for 2 full minutes then spit. do this twice daily 473 mL 0   cyanocobalamin (VITAMIN B12)  1000 MCG tablet Take 1,000 mcg by mouth daily.     diphenoxylate-atropine (LOMOTIL) 2.5-0.025 MG tablet Take 1 tablet by mouth 4 (four) times daily as needed for diarrhea or loose stools. 30 tablet 2   flurbiprofen (ANSAID) 100 MG tablet Take by mouth.     furosemide (LASIX) 20 MG tablet Take 1 tablet (20 mg total) by mouth daily. 90 tablet 6   glucose blood (FREESTYLE LITE) test strip Use 1 strip to check blood sugar 3 (three) times daily. 50 strip 3   levothyroxine (SYNTHROID) 175 MCG tablet Take 1 tablet (175 mcg total) by mouth in the morning on an empty stomach 90 tablet 1   losartan-hydrochlorothiazide (HYZAAR) 100-25 MG tablet Take 1 tablet by mouth daily. 30 tablet 11   losartan-hydrochlorothiazide (HYZAAR) 100-25 MG tablet Take 1 tablet by mouth daily. 30 tablet 11   metFORMIN (GLUCOPHAGE-XR) 500 MG 24 hr tablet Take 1 tablet (500 mg total) by mouth with evening meal 90 tablet 0   Multiple Vitamin (MULTIVITAMIN) tablet Take 1 tablet by mouth daily.     omega-3 acid ethyl esters (LOVAZA) 1 g capsule Take 1 g by mouth 2 (two) times daily.      omeprazole (PRILOSEC) 40 MG capsule Take 1 capsule (40 mg total) by mouth daily. 30 capsule 3   ondansetron (ZOFRAN-ODT) 8 MG disintegrating tablet Dissolve 1 tablet (8 mg total) by mouth every 8 (eight) hours as needed for  nausea or vomiting. 20 tablet 0   oxyCODONE (OXY IR/ROXICODONE) 5 MG immediate release tablet Take 1 tablet (5 mg total) by mouth every 4 (four) hours as needed for severe pain. 60 tablet 0   oxyCODONE-acetaminophen (PERCOCET/ROXICET) 5-325 MG tablet Take 1 tablet by mouth every 8 (eight) hours as needed for severe pain. 20 tablet 0   potassium chloride (KLOR-CON) 10 MEQ tablet Take 2 tablets (20 mEq total) by mouth daily. 60 tablet 1   pregabalin (LYRICA) 100 MG capsule Take 1 capsule by mouth 3 times daily. 90 capsule 3   rosuvastatin (CRESTOR) 5 MG tablet Take 1 tablet (5 mg total) by mouth daily. 90 tablet 1   sertraline  (ZOLOFT) 100 MG tablet Take 1 tablet (100 mg total) by mouth daily. 90 tablet 3   Vitamin D, Cholecalciferol, 1000 units CAPS Take 1 tablet by mouth daily. (Patient taking differently: Take 1,000 Units by mouth daily.) 90 capsule 4   No current facility-administered medications for this visit.    PHYSICAL EXAMINATION: ECOG PERFORMANCE STATUS: {CHL ONC ECOG [phone]}  There were no vitals filed for this visit. There were no vitals filed for this visit.  BREAST:*** No palpable masses or nodules in either right or left breasts. No palpable axillary supraclavicular or infraclavicular adenopathy no breast tenderness or nipple discharge. (exam performed in the presence of a chaperone)  LABORATORY DATA:  I have reviewed the data as listed    Latest Ref Rng & Units 06/18/2022    8:13 AM 06/04/2022    7:33 AM 05/28/2022    7:26 AM  CMP  Glucose 70 - 99 mg/dL 112  111  121   BUN 6 - 20 mg/dL 13  14  14   $ Creatinine 0.44 - 1.00 mg/dL 0.82  0.87  1.15   Sodium 135 - 145 mmol/L 141  140  140   Potassium 3.5 - 5.1 mmol/L 3.1  3.4  3.2   Chloride 98 - 111 mmol/L 101  103  102   CO2 22 - 32 mmol/L 33  32  31   Calcium 8.9 - 10.3 mg/dL 9.4  9.0  9.5   Total Protein 6.5 - 8.1 g/dL 6.3  6.8  6.5   Total Bilirubin 0.3 - 1.2 mg/dL 0.4  0.3  0.4   Alkaline Phos 38 - 126 U/L 232  238  226   AST 15 - 41 U/L 41  44  41   ALT 0 - 44 U/L 52  49  57     Lab Results  Component Value Date   WBC 4.6 06/18/2022   HGB 10.7 (L) 06/18/2022   HCT 32.8 (L) 06/18/2022   MCV 91.6 06/18/2022   PLT 227 06/18/2022   NEUTROABS 3.3 06/18/2022    ASSESSMENT & PLAN:  No problem-specific Assessment & Plan notes found for this encounter.    No orders of the defined types were placed in this encounter.  The patient has a good understanding of the overall plan. she agrees with it. she will call with any problems that may develop before the next visit here. Total time spent: 30 mins including face to face time  and time spent for planning, charting and co-ordination of care   Suzzette Righter, [place] 06/24/22    I Gardiner Coins am acting as a Education administrator for [employer]  ***

## 2022-06-24 NOTE — Assessment & Plan Note (Signed)
Metastatic Breast Cancer: ER Pos and Her 2 Neg (previously progressed on Ibrance, fulvestrant, everolimus, alpelisib, Xeloda) Prior Treatment: Taxol 09/10/2020-06/18/21 Chronic right arm lymphedema IHC: HER2 0 ----------------------------------------------------------------------------------------------------------------------- Current treatment: Palliative treatment with Sacituzumab-Govitecan, today cycle 17   Toxicities: 1.  Neutropenia: We reduced the dosage of Trodelvy with cycle 2 2. Fatigue and joint stiffness: There appears to be more cumulative fatigue 3.  Chemotherapy-induced anemia today's hemoglobin is improving and today it is 11.1 4.  Nausea: Takes nausea medication 5. Constipation alternating with diarrhea 6.  Dryness of the face and acne 7.  Blurring of vision towards the end of the day: Possibly due to exhaustion.  Encouraged her to take more rest through the day.  She works full-time and stays very busy.    CT CAP 04/14/2022: Similar subpleural pulmonary nodules.  Decrease in mediastinal lymph node, stable bone metastases, regression/resolution of the central liver lesion, cirrhosis and hepatic steatosis   Return to clinic for Trodelvy (days 1 and 8) and follow-ups every 3 weeks

## 2022-06-25 ENCOUNTER — Other Ambulatory Visit (HOSPITAL_COMMUNITY): Payer: Self-pay

## 2022-06-25 ENCOUNTER — Other Ambulatory Visit: Payer: Self-pay

## 2022-06-25 ENCOUNTER — Inpatient Hospital Stay (HOSPITAL_BASED_OUTPATIENT_CLINIC_OR_DEPARTMENT_OTHER): Payer: Commercial Managed Care - PPO | Admitting: Hematology and Oncology

## 2022-06-25 ENCOUNTER — Inpatient Hospital Stay: Payer: Commercial Managed Care - PPO

## 2022-06-25 VITALS — BP 160/74 | HR 96 | Temp 97.7°F | Resp 18 | Wt 295.2 lb

## 2022-06-25 DIAGNOSIS — Z95828 Presence of other vascular implants and grafts: Secondary | ICD-10-CM

## 2022-06-25 DIAGNOSIS — C50112 Malignant neoplasm of central portion of left female breast: Secondary | ICD-10-CM

## 2022-06-25 DIAGNOSIS — C50111 Malignant neoplasm of central portion of right female breast: Secondary | ICD-10-CM | POA: Diagnosis not present

## 2022-06-25 DIAGNOSIS — Z170 Estrogen receptor positive status [ER+]: Secondary | ICD-10-CM | POA: Diagnosis not present

## 2022-06-25 DIAGNOSIS — Z5112 Encounter for antineoplastic immunotherapy: Secondary | ICD-10-CM | POA: Diagnosis not present

## 2022-06-25 DIAGNOSIS — C7800 Secondary malignant neoplasm of unspecified lung: Secondary | ICD-10-CM | POA: Diagnosis not present

## 2022-06-25 DIAGNOSIS — C787 Secondary malignant neoplasm of liver and intrahepatic bile duct: Secondary | ICD-10-CM

## 2022-06-25 DIAGNOSIS — C50211 Malignant neoplasm of upper-inner quadrant of right female breast: Secondary | ICD-10-CM | POA: Diagnosis not present

## 2022-06-25 LAB — CBC WITH DIFFERENTIAL (CANCER CENTER ONLY)
Abs Immature Granulocytes: 0.09 10*3/uL — ABNORMAL HIGH (ref 0.00–0.07)
Basophils Absolute: 0 10*3/uL (ref 0.0–0.1)
Basophils Relative: 1 %
Eosinophils Absolute: 0.1 10*3/uL (ref 0.0–0.5)
Eosinophils Relative: 2 %
HCT: 33.3 % — ABNORMAL LOW (ref 36.0–46.0)
Hemoglobin: 10.9 g/dL — ABNORMAL LOW (ref 12.0–15.0)
Immature Granulocytes: 2 %
Lymphocytes Relative: 14 %
Lymphs Abs: 0.7 10*3/uL (ref 0.7–4.0)
MCH: 30.1 pg (ref 26.0–34.0)
MCHC: 32.7 g/dL (ref 30.0–36.0)
MCV: 92 fL (ref 80.0–100.0)
Monocytes Absolute: 0.6 10*3/uL (ref 0.1–1.0)
Monocytes Relative: 14 %
Neutro Abs: 3.1 10*3/uL (ref 1.7–7.7)
Neutrophils Relative %: 67 %
Platelet Count: 275 10*3/uL (ref 150–400)
RBC: 3.62 MIL/uL — ABNORMAL LOW (ref 3.87–5.11)
RDW: 19 % — ABNORMAL HIGH (ref 11.5–15.5)
WBC Count: 4.6 10*3/uL (ref 4.0–10.5)
nRBC: 0 % (ref 0.0–0.2)

## 2022-06-25 LAB — CMP (CANCER CENTER ONLY)
ALT: 39 U/L (ref 0–44)
AST: 48 U/L — ABNORMAL HIGH (ref 15–41)
Albumin: 2.5 g/dL — ABNORMAL LOW (ref 3.5–5.0)
Alkaline Phosphatase: 226 U/L — ABNORMAL HIGH (ref 38–126)
Anion gap: 11 (ref 5–15)
BUN: 12 mg/dL (ref 6–20)
CO2: 28 mmol/L (ref 22–32)
Calcium: 8.8 mg/dL — ABNORMAL LOW (ref 8.9–10.3)
Chloride: 100 mmol/L (ref 98–111)
Creatinine: 0.36 mg/dL — ABNORMAL LOW (ref 0.44–1.00)
GFR, Estimated: >60 mL/min (ref >60)
Glucose, Bld: 132 mg/dL — ABNORMAL HIGH (ref 70–99)
Potassium: 3.4 mmol/L — ABNORMAL LOW (ref 3.5–5.1)
Sodium: 139 mmol/L (ref 135–145)
Total Bilirubin: 0.5 mg/dL (ref 0.3–1.2)
Total Protein: 5.9 g/dL — ABNORMAL LOW (ref 6.5–8.1)

## 2022-06-25 LAB — MAGNESIUM: Magnesium: 2.5 mg/dL — ABNORMAL HIGH (ref 1.7–2.4)

## 2022-06-25 MED ORDER — PALONOSETRON HCL INJECTION 0.25 MG/5ML
0.2500 mg | Freq: Once | INTRAVENOUS | Status: AC
  Administered 2022-06-25: 0.25 mg via INTRAVENOUS
  Filled 2022-06-25: qty 5

## 2022-06-25 MED ORDER — FAMOTIDINE IN NACL 20-0.9 MG/50ML-% IV SOLN
20.0000 mg | Freq: Once | INTRAVENOUS | Status: AC
  Administered 2022-06-25: 20 mg via INTRAVENOUS
  Filled 2022-06-25: qty 50

## 2022-06-25 MED ORDER — SODIUM CHLORIDE 0.9 % IV SOLN
150.0000 mg | Freq: Once | INTRAVENOUS | Status: AC
  Administered 2022-06-25: 150 mg via INTRAVENOUS
  Filled 2022-06-25: qty 150

## 2022-06-25 MED ORDER — HEPARIN SOD (PORK) LOCK FLUSH 100 UNIT/ML IV SOLN: 500.0000 [IU] | Freq: Once | INTRAVENOUS | Status: DC | PRN

## 2022-06-25 MED ORDER — ATROPINE SULFATE 1 MG/ML IV SOLN: 0.5000 mg | Freq: Once | INTRAVENOUS | Status: DC | PRN

## 2022-06-25 MED ORDER — SODIUM CHLORIDE 0.9 % IV SOLN
10.0000 mg | Freq: Once | INTRAVENOUS | Status: AC
  Administered 2022-06-25: 10 mg via INTRAVENOUS
  Filled 2022-06-25: qty 10

## 2022-06-25 MED ORDER — SODIUM CHLORIDE 0.9 % IV SOLN: Freq: Once | INTRAVENOUS | Status: AC

## 2022-06-25 MED ORDER — METFORMIN HCL ER 500 MG PO TB24
500.0000 mg | ORAL_TABLET | Freq: Every evening | ORAL | 1 refills | Status: DC
  Filled 2022-06-25: qty 90, 90d supply, fill #0

## 2022-06-25 MED ORDER — ACETAMINOPHEN 325 MG PO TABS
650.0000 mg | ORAL_TABLET | Freq: Once | ORAL | Status: AC
  Administered 2022-06-25: 650 mg via ORAL
  Filled 2022-06-25: qty 2

## 2022-06-25 MED ORDER — SODIUM CHLORIDE 0.9 % IV SOLN
5.0000 mg/kg | Freq: Once | INTRAVENOUS | Status: AC
  Administered 2022-06-25: 720 mg via INTRAVENOUS
  Filled 2022-06-25: qty 72

## 2022-06-25 MED ORDER — SODIUM CHLORIDE 0.9% FLUSH
10.0000 mL | Freq: Once | INTRAVENOUS | Status: AC
  Administered 2022-06-25: 10 mL

## 2022-06-25 MED ORDER — DIPHENHYDRAMINE HCL 50 MG/ML IJ SOLN
50.0000 mg | Freq: Once | INTRAMUSCULAR | Status: AC
  Administered 2022-06-25: 50 mg via INTRAVENOUS
  Filled 2022-06-25: qty 1

## 2022-06-25 MED ORDER — SODIUM CHLORIDE 0.9% FLUSH: 10.0000 mL | INTRAVENOUS | Status: DC | PRN

## 2022-06-25 NOTE — Progress Notes (Signed)
Per Lindi Adie MD, ok to proceed today with pending CMP and Mag results, due to lab delays

## 2022-06-25 NOTE — Patient Instructions (Addendum)
[facility]  Discharge Instructions: Thank you for choosing [facility] to provide your oncology and hematology care.   If you have a lab appointment with the [facility], please go directly to the [facility] and check in at the registration area.   Wear comfortable clothing and clothing appropriate for easy access to any Portacath or PICC line.   We strive to give you quality time with your provider. You may need to reschedule your appointment if you arrive late (15 or more minutes).  Arriving late affects you and other patients whose appointments are after yours.  Also, if you miss three or more appointments without notifying the office, you may be dismissed from the clinic at the provider's discretion.      For prescription refill requests, have your pharmacy contact our office and allow 72 hours for refills to be completed.    Today you received the following chemotherapy and/or immunotherapy agents: Trodelvy.       To help prevent nausea and vomiting after your treatment, we encourage you to take your nausea medication as directed.  BELOW ARE SYMPTOMS THAT SHOULD BE REPORTED IMMEDIATELY: *FEVER GREATER THAN 100.4 F (38 C) OR HIGHER *CHILLS OR SWEATING *NAUSEA AND VOMITING THAT IS NOT CONTROLLED WITH YOUR NAUSEA MEDICATION *UNUSUAL SHORTNESS OF BREATH *UNUSUAL BRUISING OR BLEEDING *URINARY PROBLEMS (pain or burning when urinating, or frequent urination) *BOWEL PROBLEMS (unusual diarrhea, constipation, pain near the anus) TENDERNESS IN MOUTH AND THROAT WITH OR WITHOUT PRESENCE OF ULCERS (sore throat, sores in mouth, or a toothache) UNUSUAL RASH, SWELLING OR PAIN  UNUSUAL VAGINAL DISCHARGE OR ITCHING   Items with * indicate a potential emergency and should be followed up as soon as possible or go to the Emergency Department if any problems should occur.  Please show the CHEMOTHERAPY ALERT CARD or IMMUNOTHERAPY ALERT CARD at  check-in to the Emergency Department and triage nurse.  Should you have questions after your visit or need to cancel or reschedule your appointment, please contact [facility]  Dept: [phone]  and follow the prompts.  Office hours are 8:00 a.m. to 4:30 p.m. Monday - Friday. Please note that voicemails left after 4:00 p.m. may not be returned until the following business day.  We are closed weekends and major holidays. You have access to a nurse at all times for urgent questions. Please call the main number to the clinic Dept: [phone] and follow the prompts.   For any non-urgent questions, you may also contact your provider using MyChart. We now offer e-Visits for anyone 37 and older to request care online for non-urgent symptoms. For details visit [email or web address].   Also download the MyChart app! Go to the app store, search "MyChart", open the app, select [facility], and log in with your MyChart username and password.

## 2022-06-25 NOTE — Assessment & Plan Note (Signed)
Metastatic Breast Cancer: ER Pos and Her 2 Neg (previously progressed on Ibrance, fulvestrant, everolimus, alpelisib, Xeloda) Prior Treatment: Taxol 09/10/2020-06/18/21 Chronic right arm lymphedema IHC: HER2 0 ----------------------------------------------------------------------------------------------------------------------- Current treatment: Palliative treatment with Sacituzumab-Govitecan, today cycle 18   Toxicities: 1.  Neutropenia: We reduced the dosage of Trodelvy with cycle 2 2. Fatigue and joint stiffness: There appears to be more cumulative fatigue 3.  Chemotherapy-induced anemia today's hemoglobin is improving and today it is 10.9 4.  Nausea: Takes nausea medication 5. Constipation alternating with diarrhea 6.  Dryness of the face and acne 7.  Intermittent severe for Dignicap: I reduce the dosage of for treatment of 5 mg/kg today.    CT CAP 04/14/2022: Similar subpleural pulmonary nodules.  Decrease in mediastinal lymph node, stable bone metastases, regression/resolution of the central liver lesion, cirrhosis and hepatic steatosis   We will consider repeating scans again in April. Return to clinic for Trodelvy (days 1 and 8) and follow-ups every 3 weeks

## 2022-06-30 DIAGNOSIS — C7951 Secondary malignant neoplasm of bone: Secondary | ICD-10-CM | POA: Diagnosis not present

## 2022-06-30 DIAGNOSIS — D0510 Intraductal carcinoma in situ of unspecified breast: Secondary | ICD-10-CM | POA: Diagnosis not present

## 2022-06-30 DIAGNOSIS — F39 Unspecified mood [affective] disorder: Secondary | ICD-10-CM | POA: Diagnosis not present

## 2022-06-30 DIAGNOSIS — E039 Hypothyroidism, unspecified: Secondary | ICD-10-CM | POA: Diagnosis not present

## 2022-06-30 DIAGNOSIS — Z0000 Encounter for general adult medical examination without abnormal findings: Secondary | ICD-10-CM | POA: Diagnosis not present

## 2022-06-30 DIAGNOSIS — E7800 Pure hypercholesterolemia, unspecified: Secondary | ICD-10-CM | POA: Diagnosis not present

## 2022-06-30 DIAGNOSIS — K219 Gastro-esophageal reflux disease without esophagitis: Secondary | ICD-10-CM | POA: Diagnosis not present

## 2022-06-30 DIAGNOSIS — C787 Secondary malignant neoplasm of liver and intrahepatic bile duct: Secondary | ICD-10-CM | POA: Diagnosis not present

## 2022-06-30 DIAGNOSIS — I10 Essential (primary) hypertension: Secondary | ICD-10-CM | POA: Diagnosis not present

## 2022-07-02 DIAGNOSIS — C50919 Malignant neoplasm of unspecified site of unspecified female breast: Secondary | ICD-10-CM | POA: Diagnosis not present

## 2022-07-02 DIAGNOSIS — G5793 Unspecified mononeuropathy of bilateral lower limbs: Secondary | ICD-10-CM | POA: Diagnosis not present

## 2022-07-02 DIAGNOSIS — E890 Postprocedural hypothyroidism: Secondary | ICD-10-CM | POA: Diagnosis not present

## 2022-07-02 DIAGNOSIS — E1165 Type 2 diabetes mellitus with hyperglycemia: Secondary | ICD-10-CM | POA: Diagnosis not present

## 2022-07-03 ENCOUNTER — Other Ambulatory Visit: Payer: Self-pay

## 2022-07-08 MED FILL — Fosaprepitant Dimeglumine For IV Infusion 150 MG (Base Eq): INTRAVENOUS | Qty: 5 | Status: AC

## 2022-07-08 MED FILL — Dexamethasone Sodium Phosphate Inj 100 MG/10ML: INTRAMUSCULAR | Qty: 1 | Status: AC

## 2022-07-08 NOTE — Progress Notes (Signed)
Patient Care Team: Kelton Pillar, MD as PCP - General (Family Medicine) Jacelyn Pi, MD as Consulting Physician (Endocrinology) Jovita Kussmaul, MD as Consulting Physician (General Surgery) [name], MD as Attending Physician (Radiation Oncology) Belva Crome, MD (Inactive) as Consulting Physician (Cardiology) [name] [name], NP as Nurse Practitioner (Hematology and Oncology) Lajoyce Lauber, DMD (Dentistry) Raina Mina, RPH-CPP (Pharmacist) Nicholas Lose, MD as Consulting Physician (Hematology and Oncology)  DIAGNOSIS: No diagnosis found.  SUMMARY OF ONCOLOGIC HISTORY: Oncology History  Malignant neoplasm of upper-inner quadrant of right breast in female, estrogen receptor positive ([facility])  10/15/2015 Initial Biopsy   Right breast upper inner quadrant biopsy: IDC, DCIS, grade 2, ER+(90%), PR+(50%), Ki67 70%, HER-2 negative (ratio 1.18).   10/17/2015 Initial Diagnosis   Malignant neoplasm of upper-inner quadrant of right breast in female, estrogen receptor positive ([place])   10/2015 -  Anti-estrogen oral therapy   Tamoxifen daily   11/28/2015 Surgery   Left breast mammoplasty: IDC, grade 1, 1.2cm, DCIS, lobular neoplasia, T1c, Nx, ER 80%, PR60%, Ki67 3% HER-2 neg Right breast lumpectomy: IDC, grade 1, 2.3cm, DCIS. T2N1a ER/PR+HER-2 neg, Ki 70%.   11/28/2015 Miscellaneous   Mammaprint was Low Risk. Predicting a 98% chance of no disease recurrence within 5 years with anti-estrogens as the only systemic therapy.  It also predicts minimal to no benefit from chemotherapy.     12/26/2015 Surgery   Bilateral mastectomies: bilateral sentinel node sampling, Right: no residual carcinoma, left, focal DCIS, 4 SLN negative   02/11/2016 - 03/27/2016 Radiation Therapy   Adjuvant radiation [name]): 1) Right chest wall: 50.4 Gy in 28 fractions  2) Right chest wall boost: 10 Gy in 5 fractions   08/04/2017 Relapse/Recurrence   08/04/2017, MRI of the liver and total spine MRI  08/11/2017 showed liver and bone metastasis; liver biopsy 09/07/2017 confirms estrogen receptor positive, progesterone receptor negative, HER-2 negative metastatic breast cancer; chest CT scan 08/13/2017 shows multiple bilateral pulmonary nodules.   08/17/2017 - 01/04/2018 Anti-estrogen oral therapy   fulvestrant  palbociclib started 08/31/2017 discontinued 01/04/2018 with evidence of progression in the liver   08/2017 Miscellaneous   [facility] report on liver biopsy from April 2019 shows a negative PDL 1, stable MSI and proficient mismatch repair status.  The tumor is positive for the androgen receptor, and for PTEN for PIK3 CA mutations.  HER-2/neu was read as equivocal (it was negative by FISH on the pathology report).   01/04/2018 Miscellaneous   anastrozole started 01/04/2018 everolimus started 01/29/2018   02/16/2018 - 03/01/2018 Radiation Therapy   01/28/18 prophylactic left femur intramedullary nail surgery for impending left femur pathologic fracture alliative radiation with Dr. [name] to the left femur and left ilium from 02/16/2018 to 03/01/2018   01/30/2020 - 05/2020 Anti-estrogen oral therapy   alpelisib started 01/30/2020 at 300 mg daily with Metformin 500 mg daily and Faslodex (stopped for progression)   05/22/2020 - 08/2020 Chemotherapy   Xeloda discontinued for progression   09/10/2020 - 06/18/2021 Chemotherapy   Patient is on Treatment Plan : BREAST Paclitaxel D1,D15 q28d     07/17/2021 - 12/18/2021 Chemotherapy   Patient is on Treatment Plan : BREAST METASTATIC Sacituzumab govitecan-hziy [name]) q21d     01/01/2022 -  Chemotherapy   Patient is on Treatment Plan : BREAST METASTATIC Sacituzumab govitecan-hziy [name]) D1,8 q21d     Bilateral malignant neoplasm of breast in female, estrogen receptor positive ([place])  12/26/2015 Initial Diagnosis   Bilateral malignant neoplasm of breast in female, estrogen receptor  positive ([place])   07/17/2021 - 12/18/2021 Chemotherapy   Patient is on  Treatment Plan : BREAST METASTATIC Sacituzumab govitecan-hziy [name]) q21d     01/01/2022 -  Chemotherapy   Patient is on Treatment Plan : BREAST METASTATIC Sacituzumab govitecan-hziy [name]) D1,8 q21d     Malignant neoplasm metastatic to liver ([place])  08/13/2017 Initial Diagnosis   Liver metastases ([place])   07/17/2021 - 12/18/2021 Chemotherapy   Patient is on Treatment Plan : BREAST METASTATIC Sacituzumab govitecan-hziy [name]) q21d     01/01/2022 -  Chemotherapy   Patient is on Treatment Plan : BREAST METASTATIC Sacituzumab govitecan-hziy [name]) D1,8 q21d     Malignant neoplasm metastatic to lung ([place])  09/10/2020 Initial Diagnosis   Lung metastases ([place])   07/17/2021 - 12/18/2021 Chemotherapy   Patient is on Treatment Plan : BREAST METASTATIC Sacituzumab govitecan-hziy [name]) q21d     01/01/2022 -  Chemotherapy   Patient is on Treatment Plan : BREAST METASTATIC Sacituzumab govitecan-hziy [name]) D1,8 q21d       CHIEF COMPLIANT: Follow-up metastatic breast cancer on Trodelvey      INTERVAL HISTORY: Debbie [name] is a 57 year old with above-mentioned history of metastatic breast cancer currently on cycle 71 Sacituzumab-Govitecan,  She presents to the clinic for a follow-up.   ALLERGIES:  is allergic to ace inhibitors, atorvastatin, and simvastatin.  MEDICATIONS:  Current Outpatient Medications  Medication Sig Dispense Refill   acetaminophen (TYLENOL) 500 MG tablet Take 1,000 mg by mouth every 6 (six) hours as needed.     Calcium-Magnesium 250-125 MG TABS Take 1 tablet by mouth daily. 120 each 4   cetirizine (ZYRTEC) 10 MG tablet Take 10 mg by mouth daily as needed for allergies.      chlorhexidine (PERIDEX) 0.12 % solution Use 5 mLs in the mouth or throat 2 (two) times daily as needed as directed. ****Do not swallow**** 473 mL 6   chlorhexidine (PERIDEX) 0.12 % solution Swish using 60ms for 2 full minutes then spit. do this twice daily 473 mL 0   cyanocobalamin  (VITAMIN B12) 1000 MCG tablet Take 1,000 mcg by mouth daily.     diphenoxylate-atropine (LOMOTIL) 2.5-0.025 MG tablet Take 1 tablet by mouth 4 (four) times daily as needed for diarrhea or loose stools. 30 tablet 2   flurbiprofen (ANSAID) 100 MG tablet Take by mouth.     furosemide (LASIX) 20 MG tablet Take 1 tablet (20 mg total) by mouth daily. 90 tablet 6   glucose blood (FREESTYLE LITE) test strip Use 1 strip to check blood sugar 3 (three) times daily. 50 strip 3   levothyroxine (SYNTHROID) 175 MCG tablet Take 1 tablet (175 mcg total) by mouth in the morning on an empty stomach 90 tablet 1   losartan-hydrochlorothiazide (HYZAAR) 100-25 MG tablet Take 1 tablet by mouth daily. 30 tablet 11   losartan-hydrochlorothiazide (HYZAAR) 100-25 MG tablet Take 1 tablet by mouth daily. 30 tablet 11   metFORMIN (GLUCOPHAGE-XR) 500 MG 24 hr tablet Take 1 tablet (500 mg total) by mouth every evening with meal 90 tablet 1   Multiple Vitamin (MULTIVITAMIN) tablet Take 1 tablet by mouth daily.     omega-3 acid ethyl esters (LOVAZA) 1 g capsule Take 1 g by mouth 2 (two) times daily.      omeprazole (PRILOSEC) 40 MG capsule Take 1 capsule (40 mg total) by mouth daily. 30 capsule 3   ondansetron (ZOFRAN-ODT) 8 MG disintegrating tablet Dissolve 1 tablet (8 mg total) by mouth every 8 (eight) hours  as needed for nausea or vomiting. 20 tablet 0   oxyCODONE (OXY IR/ROXICODONE) 5 MG immediate release tablet Take 1 tablet (5 mg total) by mouth every 4 (four) hours as needed for severe pain. 60 tablet 0   oxyCODONE-acetaminophen (PERCOCET/ROXICET) 5-325 MG tablet Take 1 tablet by mouth every 8 (eight) hours as needed for severe pain. 20 tablet 0   potassium chloride (KLOR-CON) 10 MEQ tablet Take 2 tablets (20 mEq total) by mouth daily. 60 tablet 1   pregabalin (LYRICA) 100 MG capsule Take 1 capsule by mouth 3 times daily. 90 capsule 3   rosuvastatin (CRESTOR) 5 MG tablet Take 1 tablet (5 mg total) by mouth daily. 90 tablet 1    sertraline (ZOLOFT) 100 MG tablet Take 1 tablet (100 mg total) by mouth daily. 90 tablet 3   Vitamin D, Cholecalciferol, 1000 units CAPS Take 1 tablet by mouth daily. (Patient taking differently: Take 1,000 Units by mouth daily.) 90 capsule 4   No current facility-administered medications for this visit.    PHYSICAL EXAMINATION: ECOG PERFORMANCE STATUS: {CHL ONC ECOG PS:[id number]}  There were no vitals filed for this visit. There were no vitals filed for this visit.  BREAST:*** No palpable masses or nodules in either right or left breasts. No palpable axillary supraclavicular or infraclavicular adenopathy no breast tenderness or nipple discharge. (exam performed in the presence of a chaperone)  LABORATORY DATA:  I have reviewed the data as listed    Latest Ref Rng & Units 06/25/2022    7:46 AM 06/18/2022    8:13 AM 06/04/2022    7:33 AM  CMP  Glucose 70 - 99 mg/dL 132  112  111   BUN 6 - 20 mg/dL '12  13  14   '$ Creatinine 0.44 - 1.00 mg/dL 0.36  0.82  0.87   Sodium 135 - 145 mmol/L 139  141  140   Potassium 3.5 - 5.1 mmol/L 3.4  3.1  3.4   Chloride 98 - 111 mmol/L 100  101  103   CO2 22 - 32 mmol/L 28  33  32   Calcium 8.9 - 10.3 mg/dL 8.8  9.4  9.0   Total Protein 6.5 - 8.1 g/dL 5.9  6.3  6.8   Total Bilirubin 0.3 - 1.2 mg/dL 0.5  0.4  0.3   Alkaline Phos 38 - 126 U/L 226  232  238   AST 15 - 41 U/L 48  41  44   ALT 0 - 44 U/L 39  52  49     Lab Results  Component Value Date   WBC 4.6 06/25/2022   HGB 10.9 (L) 06/25/2022   HCT 33.3 (L) 06/25/2022   MCV 92.0 06/25/2022   PLT 275 06/25/2022   NEUTROABS 3.1 06/25/2022    ASSESSMENT & PLAN:  No problem-specific Assessment & Plan notes found for this encounter.    No orders of the defined types were placed in this encounter.  The patient has a good understanding of the overall plan. she agrees with it. she will call with any problems that may develop before the next visit here. Total time spent: 30 mins including face  to face time and time spent for planning, charting and co-ordination of care   Suzzette Righter, [place] 07/08/22    I Gardiner Coins am acting as a Education administrator for [employer]  ***

## 2022-07-09 ENCOUNTER — Inpatient Hospital Stay: Payer: Commercial Managed Care - PPO

## 2022-07-09 ENCOUNTER — Inpatient Hospital Stay (HOSPITAL_BASED_OUTPATIENT_CLINIC_OR_DEPARTMENT_OTHER): Payer: Commercial Managed Care - PPO | Admitting: Hematology and Oncology

## 2022-07-09 ENCOUNTER — Other Ambulatory Visit (HOSPITAL_COMMUNITY): Payer: Self-pay

## 2022-07-09 VITALS — BP 162/75 | HR 94 | Temp 97.8°F | Resp 18 | Ht 66.0 in | Wt 293.6 lb

## 2022-07-09 VITALS — BP 114/72 | HR 84

## 2022-07-09 DIAGNOSIS — C50111 Malignant neoplasm of central portion of right female breast: Secondary | ICD-10-CM | POA: Diagnosis not present

## 2022-07-09 DIAGNOSIS — C787 Secondary malignant neoplasm of liver and intrahepatic bile duct: Secondary | ICD-10-CM

## 2022-07-09 DIAGNOSIS — E876 Hypokalemia: Secondary | ICD-10-CM

## 2022-07-09 DIAGNOSIS — C7800 Secondary malignant neoplasm of unspecified lung: Secondary | ICD-10-CM | POA: Diagnosis not present

## 2022-07-09 DIAGNOSIS — Z170 Estrogen receptor positive status [ER+]: Secondary | ICD-10-CM

## 2022-07-09 DIAGNOSIS — C50211 Malignant neoplasm of upper-inner quadrant of right female breast: Secondary | ICD-10-CM

## 2022-07-09 DIAGNOSIS — C50112 Malignant neoplasm of central portion of left female breast: Secondary | ICD-10-CM

## 2022-07-09 DIAGNOSIS — Z95828 Presence of other vascular implants and grafts: Secondary | ICD-10-CM

## 2022-07-09 DIAGNOSIS — Z5112 Encounter for antineoplastic immunotherapy: Secondary | ICD-10-CM | POA: Diagnosis not present

## 2022-07-09 LAB — CMP (CANCER CENTER ONLY)
ALT: 41 U/L (ref 0–44)
AST: 35 U/L (ref 15–41)
Albumin: 3.5 g/dL (ref 3.5–5.0)
Alkaline Phosphatase: 240 U/L — ABNORMAL HIGH (ref 38–126)
Anion gap: 7 (ref 5–15)
BUN: 11 mg/dL (ref 6–20)
CO2: 32 mmol/L (ref 22–32)
Calcium: 9.1 mg/dL (ref 8.9–10.3)
Chloride: 101 mmol/L (ref 98–111)
Creatinine: 0.68 mg/dL (ref 0.44–1.00)
GFR, Estimated: >60 mL/min (ref >60)
Glucose, Bld: 114 mg/dL — ABNORMAL HIGH (ref 70–99)
Potassium: 3.1 mmol/L — ABNORMAL LOW (ref 3.5–5.1)
Sodium: 140 mmol/L (ref 135–145)
Total Bilirubin: 0.4 mg/dL (ref 0.3–1.2)
Total Protein: 6.2 g/dL — ABNORMAL LOW (ref 6.5–8.1)

## 2022-07-09 LAB — CBC WITH DIFFERENTIAL (CANCER CENTER ONLY)
Abs Immature Granulocytes: 0.02 10*3/uL (ref 0.00–0.07)
Basophils Absolute: 0 10*3/uL (ref 0.0–0.1)
Basophils Relative: 0 %
Eosinophils Absolute: 0.1 10*3/uL (ref 0.0–0.5)
Eosinophils Relative: 2 %
HCT: 33 % — ABNORMAL LOW (ref 36.0–46.0)
Hemoglobin: 10.8 g/dL — ABNORMAL LOW (ref 12.0–15.0)
Immature Granulocytes: 0 %
Lymphocytes Relative: 11 %
Lymphs Abs: 0.5 10*3/uL — ABNORMAL LOW (ref 0.7–4.0)
MCH: 29.8 pg (ref 26.0–34.0)
MCHC: 32.7 g/dL (ref 30.0–36.0)
MCV: 91.2 fL (ref 80.0–100.0)
Monocytes Absolute: 0.5 10*3/uL (ref 0.1–1.0)
Monocytes Relative: 11 %
Neutro Abs: 3.6 10*3/uL (ref 1.7–7.7)
Neutrophils Relative %: 76 %
Platelet Count: 237 10*3/uL (ref 150–400)
RBC: 3.62 MIL/uL — ABNORMAL LOW (ref 3.87–5.11)
RDW: 18.9 % — ABNORMAL HIGH (ref 11.5–15.5)
WBC Count: 4.8 10*3/uL (ref 4.0–10.5)
nRBC: 0 % (ref 0.0–0.2)

## 2022-07-09 LAB — MAGNESIUM: Magnesium: 1.6 mg/dL — ABNORMAL LOW (ref 1.7–2.4)

## 2022-07-09 LAB — PHOSPHORUS: Phosphorus: 4.6 mg/dL (ref 2.5–4.6)

## 2022-07-09 MED ORDER — PALONOSETRON HCL INJECTION 0.25 MG/5ML
0.2500 mg | Freq: Once | INTRAVENOUS | Status: AC
  Administered 2022-07-09: 0.25 mg via INTRAVENOUS
  Filled 2022-07-09: qty 5

## 2022-07-09 MED ORDER — DIPHENHYDRAMINE HCL 50 MG/ML IJ SOLN
50.0000 mg | Freq: Once | INTRAMUSCULAR | Status: AC
  Administered 2022-07-09: 50 mg via INTRAVENOUS
  Filled 2022-07-09: qty 1

## 2022-07-09 MED ORDER — SODIUM CHLORIDE 0.9% FLUSH
10.0000 mL | INTRAVENOUS | Status: DC | PRN
  Administered 2022-07-09: 10 mL

## 2022-07-09 MED ORDER — SODIUM CHLORIDE 0.9 % IV SOLN
150.0000 mg | Freq: Once | INTRAVENOUS | Status: AC
  Administered 2022-07-09: 150 mg via INTRAVENOUS
  Filled 2022-07-09: qty 150

## 2022-07-09 MED ORDER — SODIUM CHLORIDE 0.9 % IV SOLN: Freq: Once | INTRAVENOUS | Status: AC

## 2022-07-09 MED ORDER — SODIUM CHLORIDE 0.9 % IV SOLN
5.0000 mg/kg | Freq: Once | INTRAVENOUS | Status: AC
  Administered 2022-07-09: 720 mg via INTRAVENOUS
  Filled 2022-07-09: qty 72

## 2022-07-09 MED ORDER — HEPARIN SOD (PORK) LOCK FLUSH 100 UNIT/ML IV SOLN
500.0000 [IU] | Freq: Once | INTRAVENOUS | Status: AC | PRN
  Administered 2022-07-09: 500 [IU]

## 2022-07-09 MED ORDER — FAMOTIDINE IN NACL 20-0.9 MG/50ML-% IV SOLN
20.0000 mg | Freq: Once | INTRAVENOUS | Status: AC
  Administered 2022-07-09: 20 mg via INTRAVENOUS
  Filled 2022-07-09: qty 50

## 2022-07-09 MED ORDER — SODIUM CHLORIDE 0.9% FLUSH
10.0000 mL | Freq: Once | INTRAVENOUS | Status: AC
  Administered 2022-07-09: 10 mL

## 2022-07-09 MED ORDER — SODIUM CHLORIDE 0.9 % IV SOLN
10.0000 mg | Freq: Once | INTRAVENOUS | Status: AC
  Administered 2022-07-09: 10 mg via INTRAVENOUS
  Filled 2022-07-09: qty 10

## 2022-07-09 MED ORDER — POTASSIUM CHLORIDE ER 10 MEQ PO TBCR
30.0000 meq | EXTENDED_RELEASE_TABLET | Freq: Every day | ORAL | 3 refills | Status: DC
  Filled 2022-07-09 – 2022-07-13 (×8): qty 180, 60d supply, fill #0

## 2022-07-09 MED ORDER — ACETAMINOPHEN 325 MG PO TABS
650.0000 mg | ORAL_TABLET | Freq: Once | ORAL | Status: AC
  Administered 2022-07-09: 650 mg via ORAL
  Filled 2022-07-09: qty 2

## 2022-07-09 NOTE — Patient Instructions (Signed)
[facility]  Discharge Instructions: Thank you for choosing [facility] to provide your oncology and hematology care.   If you have a lab appointment with the [facility], please go directly to the [facility] and check in at the registration area.   Wear comfortable clothing and clothing appropriate for easy access to any Portacath or PICC line.   We strive to give you quality time with your provider. You may need to reschedule your appointment if you arrive late (15 or more minutes).  Arriving late affects you and other patients whose appointments are after yours.  Also, if you miss three or more appointments without notifying the office, you may be dismissed from the clinic at the provider's discretion.      For prescription refill requests, have your pharmacy contact our office and allow 72 hours for refills to be completed.    Today you received the following chemotherapy and/or immunotherapy agents: Trodelvy.      To help prevent nausea and vomiting after your treatment, we encourage you to take your nausea medication as directed.  BELOW ARE SYMPTOMS THAT SHOULD BE REPORTED IMMEDIATELY: *FEVER GREATER THAN 100.4 F (38 C) OR HIGHER *CHILLS OR SWEATING *NAUSEA AND VOMITING THAT IS NOT CONTROLLED WITH YOUR NAUSEA MEDICATION *UNUSUAL SHORTNESS OF BREATH *UNUSUAL BRUISING OR BLEEDING *URINARY PROBLEMS (pain or burning when urinating, or frequent urination) *BOWEL PROBLEMS (unusual diarrhea, constipation, pain near the anus) TENDERNESS IN MOUTH AND THROAT WITH OR WITHOUT PRESENCE OF ULCERS (sore throat, sores in mouth, or a toothache) UNUSUAL RASH, SWELLING OR PAIN  UNUSUAL VAGINAL DISCHARGE OR ITCHING   Items with * indicate a potential emergency and should be followed up as soon as possible or go to the Emergency Department if any problems should occur.  Please show the CHEMOTHERAPY ALERT CARD or IMMUNOTHERAPY ALERT CARD at  check-in to the Emergency Department and triage nurse.  Should you have questions after your visit or need to cancel or reschedule your appointment, please contact [facility]  Dept: [phone]  and follow the prompts.  Office hours are 8:00 a.m. to 4:30 p.m. Monday - Friday. Please note that voicemails left after 4:00 p.m. may not be returned until the following business day.  We are closed weekends and major holidays. You have access to a nurse at all times for urgent questions. Please call the main number to the clinic Dept: [phone] and follow the prompts.   For any non-urgent questions, you may also contact your provider using MyChart. We now offer e-Visits for anyone 46 and older to request care online for non-urgent symptoms. For details visit [email or web address].   Also download the MyChart app! Go to the app store, search "MyChart", open the app, select [facility], and log in with your MyChart username and password.

## 2022-07-09 NOTE — Assessment & Plan Note (Signed)
Metastatic Breast Cancer: ER Pos and Her 2 Neg (previously progressed on Ibrance, fulvestrant, everolimus, alpelisib, Xeloda) Prior Treatment: Taxol 09/10/2020-06/18/21 Chronic right arm lymphedema IHC: HER2 0 ----------------------------------------------------------------------------------------------------------------------- Current treatment: Palliative treatment with Sacituzumab-Govitecan, today cycle 19   Toxicities: 1.  Neutropenia: We reduced the dosage of Trodelvy with cycle 2 2. Fatigue and joint stiffness: There appears to be more cumulative fatigue 3.  Chemotherapy-induced anemia today's hemoglobin is improving and today it is 10.9 4.  Nausea: Takes nausea medication 5. Constipation alternating with diarrhea 6.  Dryness of the face and acne    CT CAP 04/14/2022: Similar subpleural pulmonary nodules.  Decrease in mediastinal lymph node, stable bone metastases, regression/resolution of the central liver lesion, cirrhosis and hepatic steatosis   We will consider repeating scans again in April. Return to clinic for Trodelvy (days 1 and 8) and follow-ups every 3 weeks

## 2022-07-10 ENCOUNTER — Other Ambulatory Visit (HOSPITAL_COMMUNITY): Payer: Self-pay

## 2022-07-10 LAB — CANCER ANTIGEN 27.29: CA 27.29: 197 U/mL — ABNORMAL HIGH (ref 0.0–38.6)

## 2022-07-11 ENCOUNTER — Other Ambulatory Visit: Payer: Self-pay

## 2022-07-11 ENCOUNTER — Other Ambulatory Visit (HOSPITAL_COMMUNITY): Payer: Self-pay

## 2022-07-11 ENCOUNTER — Encounter: Payer: Self-pay | Admitting: Hematology and Oncology

## 2022-07-12 ENCOUNTER — Other Ambulatory Visit: Payer: Self-pay

## 2022-07-13 ENCOUNTER — Other Ambulatory Visit (HOSPITAL_COMMUNITY): Payer: Self-pay

## 2022-07-14 NOTE — Progress Notes (Signed)
Patient Care Team: Kelton Pillar, MD as PCP - General (Family Medicine) Jacelyn Pi, MD as Consulting Physician (Endocrinology) Jovita Kussmaul, MD as Consulting Physician (General Surgery) [name], MD as Attending Physician (Radiation Oncology) Belva Crome, MD (Inactive) as Consulting Physician (Cardiology) [name] [name], NP as Nurse Practitioner (Hematology and Oncology) Lajoyce Lauber, DMD (Dentistry) Raina Mina, RPH-CPP (Pharmacist) Nicholas Lose, MD as Consulting Physician (Hematology and Oncology)  DIAGNOSIS: No diagnosis found.  SUMMARY OF ONCOLOGIC HISTORY: Oncology History  Malignant neoplasm of upper-inner quadrant of right breast in female, estrogen receptor positive ([place])  10/15/2015 Initial Biopsy   Right breast upper inner quadrant biopsy: IDC, DCIS, grade 2, ER+(90%), PR+(50%), Ki67 70%, HER-2 negative (ratio 1.18).   10/17/2015 Initial Diagnosis   Malignant neoplasm of upper-inner quadrant of right breast in female, estrogen receptor positive ([place])   10/2015 -  Anti-estrogen oral therapy   Tamoxifen daily   11/28/2015 Surgery   Left breast mammoplasty: IDC, grade 1, 1.2cm, DCIS, lobular neoplasia, T1c, Nx, ER 80%, PR60%, Ki67 3% HER-2 neg Right breast lumpectomy: IDC, grade 1, 2.3cm, DCIS. T2N1a ER/PR+HER-2 neg, Ki 70%.   11/28/2015 Miscellaneous   Mammaprint was Low Risk. Predicting a 98% chance of no disease recurrence within 5 years with anti-estrogens as the only systemic therapy.  It also predicts minimal to no benefit from chemotherapy.     12/26/2015 Surgery   Bilateral mastectomies: bilateral sentinel node sampling, Right: no residual carcinoma, left, focal DCIS, 4 SLN negative   02/11/2016 - 03/27/2016 Radiation Therapy   Adjuvant radiation [name]): 1) Right chest wall: 50.4 Gy in 28 fractions  2) Right chest wall boost: 10 Gy in 5 fractions   08/04/2017 Relapse/Recurrence   08/04/2017, MRI of the liver and total spine MRI  08/11/2017 showed liver and bone metastasis; liver biopsy 09/07/2017 confirms estrogen receptor positive, progesterone receptor negative, HER-2 negative metastatic breast cancer; chest CT scan 08/13/2017 shows multiple bilateral pulmonary nodules.   08/17/2017 - 01/04/2018 Anti-estrogen oral therapy   fulvestrant  palbociclib started 08/31/2017 discontinued 01/04/2018 with evidence of progression in the liver   08/2017 Miscellaneous   [facility] report on liver biopsy from April 2019 shows a negative PDL 1, stable MSI and proficient mismatch repair status.  The tumor is positive for the androgen receptor, and for PTEN for PIK3 CA mutations.  HER-2/neu was read as equivocal (it was negative by FISH on the pathology report).   01/04/2018 Miscellaneous   anastrozole started 01/04/2018 everolimus started 01/29/2018   02/16/2018 - 03/01/2018 Radiation Therapy   01/28/18 prophylactic left femur intramedullary nail surgery for impending left femur pathologic fracture alliative radiation with Dr. [name] to the left femur and left ilium from 02/16/2018 to 03/01/2018   01/30/2020 - 05/2020 Anti-estrogen oral therapy   alpelisib started 01/30/2020 at 300 mg daily with Metformin 500 mg daily and Faslodex (stopped for progression)   05/22/2020 - 08/2020 Chemotherapy   Xeloda discontinued for progression   09/10/2020 - 06/18/2021 Chemotherapy   Patient is on Treatment Plan : BREAST Paclitaxel D1,D15 q28d     07/17/2021 - 12/18/2021 Chemotherapy   Patient is on Treatment Plan : BREAST METASTATIC Sacituzumab govitecan-hziy [name]) q21d     01/01/2022 -  Chemotherapy   Patient is on Treatment Plan : BREAST METASTATIC Sacituzumab govitecan-hziy [name]) D1,8 q21d     Bilateral malignant neoplasm of breast in female, estrogen receptor positive ([place])  12/26/2015 Initial Diagnosis   Bilateral malignant neoplasm of breast in female, estrogen receptor  positive ([place])   07/17/2021 - 12/18/2021 Chemotherapy   Patient is on  Treatment Plan : BREAST METASTATIC Sacituzumab govitecan-hziy [name]) q21d     01/01/2022 -  Chemotherapy   Patient is on Treatment Plan : BREAST METASTATIC Sacituzumab govitecan-hziy [name]) D1,8 q21d     Malignant neoplasm metastatic to liver ([place])  08/13/2017 Initial Diagnosis   Liver metastases ([place])   07/17/2021 - 12/18/2021 Chemotherapy   Patient is on Treatment Plan : BREAST METASTATIC Sacituzumab govitecan-hziy [name]) q21d     01/01/2022 -  Chemotherapy   Patient is on Treatment Plan : BREAST METASTATIC Sacituzumab govitecan-hziy [name]) D1,8 q21d     Malignant neoplasm metastatic to lung ([place])  09/10/2020 Initial Diagnosis   Lung metastases ([place])   07/17/2021 - 12/18/2021 Chemotherapy   Patient is on Treatment Plan : BREAST METASTATIC Sacituzumab govitecan-hziy [name]) q21d     01/01/2022 -  Chemotherapy   Patient is on Treatment Plan : BREAST METASTATIC Sacituzumab govitecan-hziy [name]) D1,8 q21d       CHIEF COMPLIANT:   INTERVAL HISTORY: KEYMORA AUBUT is a   ALLERGIES:  is allergic to ace inhibitors, atorvastatin, and simvastatin.  MEDICATIONS:  Current Outpatient Medications  Medication Sig Dispense Refill   acetaminophen (TYLENOL) 500 MG tablet Take 1,000 mg by mouth every 6 (six) hours as needed.     Calcium-Magnesium 250-125 MG TABS Take 1 tablet by mouth daily. 120 each 4   cetirizine (ZYRTEC) 10 MG tablet Take 10 mg by mouth daily as needed for allergies.      chlorhexidine (PERIDEX) 0.12 % solution Use 5 mLs in the mouth or throat 2 (two) times daily as needed as directed. ****Do not swallow**** 473 mL 6   chlorhexidine (PERIDEX) 0.12 % solution Swish using 73ms for 2 full minutes then spit. do this twice daily 473 mL 0   cyanocobalamin (VITAMIN B12) 1000 MCG tablet Take 1,000 mcg by mouth daily.     diphenoxylate-atropine (LOMOTIL) 2.5-0.025 MG tablet Take 1 tablet by mouth 4 (four) times daily as needed for diarrhea or loose stools. 30  tablet 2   flurbiprofen (ANSAID) 100 MG tablet Take by mouth.     furosemide (LASIX) 20 MG tablet Take 1 tablet (20 mg total) by mouth daily. 90 tablet 6   glucose blood (FREESTYLE LITE) test strip Use 1 strip to check blood sugar 3 (three) times daily. 50 strip 3   levothyroxine (SYNTHROID) 175 MCG tablet Take 1 tablet (175 mcg total) by mouth in the morning on an empty stomach 90 tablet 1   losartan-hydrochlorothiazide (HYZAAR) 100-25 MG tablet Take 1 tablet by mouth daily. 30 tablet 11   losartan-hydrochlorothiazide (HYZAAR) 100-25 MG tablet Take 1 tablet by mouth daily. 30 tablet 11   metFORMIN (GLUCOPHAGE-XR) 500 MG 24 hr tablet Take 1 tablet (500 mg total) by mouth every evening with meal 90 tablet 1   Multiple Vitamin (MULTIVITAMIN) capsule as directed Orally     Multiple Vitamin (MULTIVITAMIN) tablet Take 1 tablet by mouth daily.     omega-3 acid ethyl esters (LOVAZA) 1 g capsule Take 1 g by mouth 2 (two) times daily.      omeprazole (PRILOSEC) 40 MG capsule Take 1 capsule (40 mg total) by mouth daily. 30 capsule 3   ondansetron (ZOFRAN-ODT) 8 MG disintegrating tablet Dissolve 1 tablet (8 mg total) by mouth every 8 (eight) hours as needed for nausea or vomiting. 20 tablet 0   oxyCODONE (OXY IR/ROXICODONE) 5 MG immediate release tablet Take  1 tablet (5 mg total) by mouth every 4 (four) hours as needed for severe pain. 60 tablet 0   oxyCODONE-acetaminophen (PERCOCET/ROXICET) 5-325 MG tablet Take 1 tablet by mouth every 8 (eight) hours as needed for severe pain. 20 tablet 0   potassium chloride (KLOR-CON) 10 MEQ tablet Take 3 tablets (30 mEq total) by mouth daily. 180 tablet 3   pregabalin (LYRICA) 100 MG capsule Take 1 capsule by mouth 3 times daily. 90 capsule 3   rosuvastatin (CRESTOR) 5 MG tablet Take 1 tablet (5 mg total) by mouth daily. 90 tablet 1   sacituzumab govitecan-hziy (TRODELVY) 180 MG chemo injection as directed Intravenous     sertraline (ZOLOFT) 100 MG tablet Take 1 tablet  (100 mg total) by mouth daily. 90 tablet 3   valACYclovir (VALTREX) 500 MG tablet 2 tablet Orally Once a day     Vitamin D, Cholecalciferol, 1000 units CAPS Take 1 tablet by mouth daily. (Patient taking differently: Take 1,000 Units by mouth daily.) 90 capsule 4   No current facility-administered medications for this visit.    PHYSICAL EXAMINATION: ECOG PERFORMANCE STATUS: {CHL ONC ECOG PS:[id number]}  There were no vitals filed for this visit. There were no vitals filed for this visit.  BREAST:*** No palpable masses or nodules in either right or left breasts. No palpable axillary supraclavicular or infraclavicular adenopathy no breast tenderness or nipple discharge. (exam performed in the presence of a chaperone)  LABORATORY DATA:  I have reviewed the data as listed    Latest Ref Rng & Units 07/09/2022    7:52 AM 06/25/2022    7:46 AM 06/18/2022    8:13 AM  CMP  Glucose 70 - 99 mg/dL 114  132  112   BUN 6 - 20 mg/dL '11  12  13   '$ Creatinine 0.44 - 1.00 mg/dL 0.68  0.36  0.82   Sodium 135 - 145 mmol/L 140  139  141   Potassium 3.5 - 5.1 mmol/L 3.1  3.4  3.1   Chloride 98 - 111 mmol/L 101  100  101   CO2 22 - 32 mmol/L 32  28  33   Calcium 8.9 - 10.3 mg/dL 9.1  8.8  9.4   Total Protein 6.5 - 8.1 g/dL 6.2  5.9  6.3   Total Bilirubin 0.3 - 1.2 mg/dL 0.4  0.5  0.4   Alkaline Phos 38 - 126 U/L 240  226  232   AST 15 - 41 U/L 35  48  41   ALT 0 - 44 U/L 41  39  52     Lab Results  Component Value Date   WBC 4.8 07/09/2022   HGB 10.8 (L) 07/09/2022   HCT 33.0 (L) 07/09/2022   MCV 91.2 07/09/2022   PLT 237 07/09/2022   NEUTROABS 3.6 07/09/2022    ASSESSMENT & PLAN:  No problem-specific Assessment & Plan notes found for this encounter.    No orders of the defined types were placed in this encounter.  The patient has a good understanding of the overall plan. she agrees with it. she will call with any problems that may develop before the next visit here. Total time spent: 30  mins including face to face time and time spent for planning, charting and co-ordination of care   Suzzette Righter, [place] 07/14/22    I Gardiner Coins am acting as a Education administrator for [employer]  ***

## 2022-07-15 ENCOUNTER — Other Ambulatory Visit: Payer: Self-pay | Admitting: *Deleted

## 2022-07-15 DIAGNOSIS — C787 Secondary malignant neoplasm of liver and intrahepatic bile duct: Secondary | ICD-10-CM

## 2022-07-15 MED FILL — Fosaprepitant Dimeglumine For IV Infusion 150 MG (Base Eq): INTRAVENOUS | Qty: 5 | Status: AC

## 2022-07-15 MED FILL — Dexamethasone Sodium Phosphate Inj 100 MG/10ML: INTRAMUSCULAR | Qty: 1 | Status: AC

## 2022-07-16 ENCOUNTER — Inpatient Hospital Stay (HOSPITAL_BASED_OUTPATIENT_CLINIC_OR_DEPARTMENT_OTHER): Payer: Commercial Managed Care - PPO | Admitting: Hematology and Oncology

## 2022-07-16 ENCOUNTER — Inpatient Hospital Stay: Payer: Commercial Managed Care - PPO

## 2022-07-16 ENCOUNTER — Inpatient Hospital Stay: Payer: Commercial Managed Care - PPO | Attending: Oncology

## 2022-07-16 VITALS — BP 159/73 | HR 91 | Temp 97.7°F | Resp 18 | Ht 66.0 in | Wt 292.6 lb

## 2022-07-16 DIAGNOSIS — Z923 Personal history of irradiation: Secondary | ICD-10-CM | POA: Insufficient documentation

## 2022-07-16 DIAGNOSIS — C7800 Secondary malignant neoplasm of unspecified lung: Secondary | ICD-10-CM

## 2022-07-16 DIAGNOSIS — Z170 Estrogen receptor positive status [ER+]: Secondary | ICD-10-CM

## 2022-07-16 DIAGNOSIS — C50111 Malignant neoplasm of central portion of right female breast: Secondary | ICD-10-CM | POA: Diagnosis not present

## 2022-07-16 DIAGNOSIS — C7951 Secondary malignant neoplasm of bone: Secondary | ICD-10-CM | POA: Insufficient documentation

## 2022-07-16 DIAGNOSIS — C787 Secondary malignant neoplasm of liver and intrahepatic bile duct: Secondary | ICD-10-CM | POA: Diagnosis not present

## 2022-07-16 DIAGNOSIS — C50211 Malignant neoplasm of upper-inner quadrant of right female breast: Secondary | ICD-10-CM | POA: Diagnosis not present

## 2022-07-16 DIAGNOSIS — Z9013 Acquired absence of bilateral breasts and nipples: Secondary | ICD-10-CM | POA: Insufficient documentation

## 2022-07-16 DIAGNOSIS — Z5112 Encounter for antineoplastic immunotherapy: Secondary | ICD-10-CM | POA: Diagnosis not present

## 2022-07-16 DIAGNOSIS — Z79899 Other long term (current) drug therapy: Secondary | ICD-10-CM | POA: Insufficient documentation

## 2022-07-16 DIAGNOSIS — C50112 Malignant neoplasm of central portion of left female breast: Secondary | ICD-10-CM | POA: Diagnosis not present

## 2022-07-16 DIAGNOSIS — Z95828 Presence of other vascular implants and grafts: Secondary | ICD-10-CM

## 2022-07-16 LAB — CBC WITH DIFFERENTIAL (CANCER CENTER ONLY)
Abs Immature Granulocytes: 0.05 10*3/uL (ref 0.00–0.07)
Basophils Absolute: 0 10*3/uL (ref 0.0–0.1)
Basophils Relative: 1 %
Eosinophils Absolute: 0.1 10*3/uL (ref 0.0–0.5)
Eosinophils Relative: 3 %
HCT: 33.6 % — ABNORMAL LOW (ref 36.0–46.0)
Hemoglobin: 10.9 g/dL — ABNORMAL LOW (ref 12.0–15.0)
Immature Granulocytes: 1 %
Lymphocytes Relative: 14 %
Lymphs Abs: 0.6 10*3/uL — ABNORMAL LOW (ref 0.7–4.0)
MCH: 29.8 pg (ref 26.0–34.0)
MCHC: 32.4 g/dL (ref 30.0–36.0)
MCV: 91.8 fL (ref 80.0–100.0)
Monocytes Absolute: 0.5 10*3/uL (ref 0.1–1.0)
Monocytes Relative: 12 %
Neutro Abs: 2.8 10*3/uL (ref 1.7–7.7)
Neutrophils Relative %: 69 %
Platelet Count: 247 10*3/uL (ref 150–400)
RBC: 3.66 MIL/uL — ABNORMAL LOW (ref 3.87–5.11)
RDW: 18.6 % — ABNORMAL HIGH (ref 11.5–15.5)
WBC Count: 4 10*3/uL (ref 4.0–10.5)
nRBC: 0 % (ref 0.0–0.2)

## 2022-07-16 LAB — CMP (CANCER CENTER ONLY)
ALT: 47 U/L — ABNORMAL HIGH (ref 0–44)
AST: 38 U/L (ref 15–41)
Albumin: 3.5 g/dL (ref 3.5–5.0)
Alkaline Phosphatase: 263 U/L — ABNORMAL HIGH (ref 38–126)
Anion gap: 6 (ref 5–15)
BUN: 11 mg/dL (ref 6–20)
CO2: 33 mmol/L — ABNORMAL HIGH (ref 22–32)
Calcium: 9.7 mg/dL (ref 8.9–10.3)
Chloride: 102 mmol/L (ref 98–111)
Creatinine: 0.75 mg/dL (ref 0.44–1.00)
GFR, Estimated: >60 mL/min (ref >60)
Glucose, Bld: 126 mg/dL — ABNORMAL HIGH (ref 70–99)
Potassium: 3.5 mmol/L (ref 3.5–5.1)
Sodium: 141 mmol/L (ref 135–145)
Total Bilirubin: 0.4 mg/dL (ref 0.3–1.2)
Total Protein: 6.8 g/dL (ref 6.5–8.1)

## 2022-07-16 LAB — MAGNESIUM: Magnesium: 1.8 mg/dL (ref 1.7–2.4)

## 2022-07-16 LAB — PHOSPHORUS: Phosphorus: 4.1 mg/dL (ref 2.5–4.6)

## 2022-07-16 MED ORDER — SODIUM CHLORIDE 0.9% FLUSH
10.0000 mL | Freq: Once | INTRAVENOUS | Status: AC
  Administered 2022-07-16: 10 mL

## 2022-07-16 MED ORDER — SODIUM CHLORIDE 0.9 % IV SOLN: Freq: Once | INTRAVENOUS | Status: AC

## 2022-07-16 MED ORDER — PALONOSETRON HCL INJECTION 0.25 MG/5ML
0.2500 mg | Freq: Once | INTRAVENOUS | Status: AC
  Administered 2022-07-16: 0.25 mg via INTRAVENOUS
  Filled 2022-07-16: qty 5

## 2022-07-16 MED ORDER — SODIUM CHLORIDE 0.9% FLUSH
10.0000 mL | INTRAVENOUS | Status: DC | PRN
  Administered 2022-07-16: 10 mL

## 2022-07-16 MED ORDER — ACETAMINOPHEN 325 MG PO TABS
650.0000 mg | ORAL_TABLET | Freq: Once | ORAL | Status: AC
  Administered 2022-07-16: 650 mg via ORAL
  Filled 2022-07-16: qty 2

## 2022-07-16 MED ORDER — DIPHENHYDRAMINE HCL 50 MG/ML IJ SOLN
50.0000 mg | Freq: Once | INTRAMUSCULAR | Status: AC
  Administered 2022-07-16: 50 mg via INTRAVENOUS
  Filled 2022-07-16: qty 1

## 2022-07-16 MED ORDER — SODIUM CHLORIDE 0.9 % IV SOLN
10.0000 mg | Freq: Once | INTRAVENOUS | Status: AC
  Administered 2022-07-16: 10 mg via INTRAVENOUS
  Filled 2022-07-16: qty 10

## 2022-07-16 MED ORDER — SODIUM CHLORIDE 0.9 % IV SOLN
6.0000 mg/kg | Freq: Once | INTRAVENOUS | Status: AC
  Administered 2022-07-16: 720 mg via INTRAVENOUS
  Filled 2022-07-16: qty 72

## 2022-07-16 MED ORDER — SODIUM CHLORIDE 0.9 % IV SOLN
150.0000 mg | Freq: Once | INTRAVENOUS | Status: AC
  Administered 2022-07-16: 150 mg via INTRAVENOUS
  Filled 2022-07-16: qty 150

## 2022-07-16 MED ORDER — HEPARIN SOD (PORK) LOCK FLUSH 100 UNIT/ML IV SOLN
500.0000 [IU] | Freq: Once | INTRAVENOUS | Status: AC | PRN
  Administered 2022-07-16: 500 [IU]

## 2022-07-16 MED ORDER — FAMOTIDINE IN NACL 20-0.9 MG/50ML-% IV SOLN
20.0000 mg | Freq: Once | INTRAVENOUS | Status: AC
  Administered 2022-07-16: 20 mg via INTRAVENOUS
  Filled 2022-07-16: qty 50

## 2022-07-16 NOTE — Patient Instructions (Signed)
[facility]  Discharge Instructions: Thank you for choosing [facility] to provide your oncology and hematology care.   If you have a lab appointment with the [facility], please go directly to the [facility] and check in at the registration area.   Wear comfortable clothing and clothing appropriate for easy access to any Portacath or PICC line.   We strive to give you quality time with your provider. You may need to reschedule your appointment if you arrive late (15 or more minutes).  Arriving late affects you and other patients whose appointments are after yours.  Also, if you miss three or more appointments without notifying the office, you may be dismissed from the clinic at the provider's discretion.      For prescription refill requests, have your pharmacy contact our office and allow 72 hours for refills to be completed.    Today you received the following chemotherapy and/or immunotherapy agents trodelvy      To help prevent nausea and vomiting after your treatment, we encourage you to take your nausea medication as directed.  BELOW ARE SYMPTOMS THAT SHOULD BE REPORTED IMMEDIATELY: *FEVER GREATER THAN 100.4 F (38 C) OR HIGHER *CHILLS OR SWEATING *NAUSEA AND VOMITING THAT IS NOT CONTROLLED WITH YOUR NAUSEA MEDICATION *UNUSUAL SHORTNESS OF BREATH *UNUSUAL BRUISING OR BLEEDING *URINARY PROBLEMS (pain or burning when urinating, or frequent urination) *BOWEL PROBLEMS (unusual diarrhea, constipation, pain near the anus) TENDERNESS IN MOUTH AND THROAT WITH OR WITHOUT PRESENCE OF ULCERS (sore throat, sores in mouth, or a toothache) UNUSUAL RASH, SWELLING OR PAIN  UNUSUAL VAGINAL DISCHARGE OR ITCHING   Items with * indicate a potential emergency and should be followed up as soon as possible or go to the Emergency Department if any problems should occur.  Please show the CHEMOTHERAPY ALERT CARD or IMMUNOTHERAPY ALERT CARD at  check-in to the Emergency Department and triage nurse.  Should you have questions after your visit or need to cancel or reschedule your appointment, please contact [facility]  Dept: [phone]  and follow the prompts.  Office hours are 8:00 a.m. to 4:30 p.m. Monday - Friday. Please note that voicemails left after 4:00 p.m. may not be returned until the following business day.  We are closed weekends and major holidays. You have access to a nurse at all times for urgent questions. Please call the main number to the clinic Dept: [phone] and follow the prompts.   For any non-urgent questions, you may also contact your provider using MyChart. We now offer e-Visits for anyone 47 and older to request care online for non-urgent symptoms. For details visit [email or web address].   Also download the MyChart app! Go to the app store, search "MyChart", open the app, select [facility], and log in with your MyChart username and password.

## 2022-07-16 NOTE — Assessment & Plan Note (Signed)
Metastatic Breast Cancer: ER Pos and Her 2 Neg (previously progressed on Ibrance, fulvestrant, everolimus, alpelisib, Xeloda) Prior Treatment: Taxol 09/10/2020-06/18/21 Chronic right arm lymphedema IHC: HER2 0 ----------------------------------------------------------------------------------------------------------------------- Current treatment: Palliative treatment with Sacituzumab-Govitecan, today cycle 18 day 8   Toxicities: 1.  Neutropenia: We reduced the dosage of Trodelvy with cycle 2 2. Fatigue and joint stiffness: There appears to be more cumulative fatigue 3.  Chemotherapy-induced anemia today's hemoglobin is improving and today it is 10.8 4.  Nausea: Takes nausea medication 5. Constipation alternating with diarrhea 6.  Dryness of the face and acne 7.  Loss of taste and appetite: Bothering her but she wants to keep the dosage stable    CT CAP 04/14/2022: Similar subpleural pulmonary nodules.  Decrease in mediastinal lymph node, stable bone metastases, regression/resolution of the central liver lesion, cirrhosis and hepatic steatosis   We will consider repeating scans again in April. Return to clinic for Trodelvy (days 1 and 8) and follow-ups every 3 weeks

## 2022-07-21 NOTE — Progress Notes (Signed)
Patient Care Team: Maurice Small, MD as PCP - General (Family Medicine) Dorisann Frames, MD as Consulting Physician (Endocrinology) Griselda Miner, MD as Consulting Physician (General Surgery) Lonie Peak, MD as Attending Physician (Radiation Oncology) Lyn Records, MD (Inactive) as Consulting Physician (Cardiology) Axel Filler Larna Daughters, NP as Nurse Practitioner (Hematology and Oncology) Gertie Baron, DMD (Dentistry) Anselm Lis, RPH-CPP (Pharmacist) Serena Croissant, MD as Consulting Physician (Hematology and Oncology)  DIAGNOSIS:  Encounter Diagnoses  Name Primary?   Malignant neoplasm of upper-inner quadrant of right breast in female, estrogen receptor positive (HCC) Yes   Malignant neoplasm metastatic to liver [facility])    Malignant neoplasm metastatic to lung, unspecified laterality (HCC)    Malignant neoplasm of central portion of both breasts in female, estrogen receptor positive (HCC)     SUMMARY OF ONCOLOGIC HISTORY: Oncology History  Malignant neoplasm of upper-inner quadrant of right breast in female, estrogen receptor positive (HCC)  10/15/2015 Initial Biopsy   Right breast upper inner quadrant biopsy: IDC, DCIS, grade 2, ER+(90%), PR+(50%), Ki67 70%, HER-2 negative (ratio 1.18).   10/17/2015 Initial Diagnosis   Malignant neoplasm of upper-inner quadrant of right breast in female, estrogen receptor positive (HCC)   10/2015 -  Anti-estrogen oral therapy   Tamoxifen daily   11/28/2015 Surgery   Left breast mammoplasty: IDC, grade 1, 1.2cm, DCIS, lobular neoplasia, T1c, Nx, ER 80%, PR60%, Ki67 3% HER-2 neg Right breast lumpectomy: IDC, grade 1, 2.3cm, DCIS. T2N1a ER/PR+HER-2 neg, Ki 70%.   11/28/2015 Miscellaneous   Mammaprint was Low Risk. Predicting a 98% chance of no disease recurrence within 5 years with anti-estrogens as the only systemic therapy.  It also predicts minimal to no benefit from chemotherapy.     12/26/2015 Surgery   Bilateral mastectomies:  bilateral sentinel node sampling, Right: no residual carcinoma, left, focal DCIS, 4 SLN negative   02/11/2016 - 03/27/2016 Radiation Therapy   Adjuvant radiation Basilio Cairo): 1) Right chest wall: 50.4 Gy in 28 fractions  2) Right chest wall boost: 10 Gy in 5 fractions   08/04/2017 Relapse/Recurrence   08/04/2017, MRI of the liver and total spine MRI 08/11/2017 showed liver and bone metastasis; liver biopsy 09/07/2017 confirms estrogen receptor positive, progesterone receptor negative, HER-2 negative metastatic breast cancer; chest CT scan 08/13/2017 shows multiple bilateral pulmonary nodules.   08/17/2017 - 01/04/2018 Anti-estrogen oral therapy   fulvestrant  palbociclib started 08/31/2017 discontinued 01/04/2018 with evidence of progression in the liver   08/2017 Miscellaneous   [facility] report on liver biopsy from April 2019 shows a negative PDL 1, stable MSI and proficient mismatch repair status.  The tumor is positive for the androgen receptor, and for PTEN for PIK3 CA mutations.  HER-2/neu was read as equivocal (it was negative by FISH on the pathology report).   01/04/2018 Miscellaneous   anastrozole started 01/04/2018 everolimus started 01/29/2018   02/16/2018 - 03/01/2018 Radiation Therapy   01/28/18 prophylactic left femur intramedullary nail surgery for impending left femur pathologic fracture alliative radiation with Dr. Basilio Cairo to the left femur and left ilium from 02/16/2018 to 03/01/2018   01/30/2020 - 05/2020 Anti-estrogen oral therapy   alpelisib started 01/30/2020 at 300 mg daily with Metformin 500 mg daily and Faslodex (stopped for progression)   05/22/2020 - 08/2020 Chemotherapy   Xeloda discontinued for progression   09/10/2020 - 06/18/2021 Chemotherapy   Patient is on Treatment Plan : BREAST Paclitaxel D1,D15 q28d     07/17/2021 - 12/18/2021 Chemotherapy   Patient is on Treatment Plan :  BREAST METASTATIC Sacituzumab govitecan-hziy Drinda Butts) q21d     01/01/2022 -  Chemotherapy   Patient  is on Treatment Plan : BREAST METASTATIC Sacituzumab govitecan-hziy Drinda Butts) D1,8 q21d     Bilateral malignant neoplasm of breast in female, estrogen receptor positive (HCC)  12/26/2015 Initial Diagnosis   Bilateral malignant neoplasm of breast in female, estrogen receptor positive (HCC)   07/17/2021 - 12/18/2021 Chemotherapy   Patient is on Treatment Plan : BREAST METASTATIC Sacituzumab govitecan-hziy Drinda Butts) q21d     01/01/2022 -  Chemotherapy   Patient is on Treatment Plan : BREAST METASTATIC Sacituzumab govitecan-hziy Drinda Butts) D1,8 q21d     Malignant neoplasm metastatic to liver (HCC)  08/13/2017 Initial Diagnosis   Liver metastases (HCC)   07/17/2021 - 12/18/2021 Chemotherapy   Patient is on Treatment Plan : BREAST METASTATIC Sacituzumab govitecan-hziy Drinda Butts) q21d     01/01/2022 -  Chemotherapy   Patient is on Treatment Plan : BREAST METASTATIC Sacituzumab govitecan-hziy Drinda Butts) D1,8 q21d     Malignant neoplasm metastatic to lung (HCC)  09/10/2020 Initial Diagnosis   Lung metastases (HCC)   07/17/2021 - 12/18/2021 Chemotherapy   Patient is on Treatment Plan : BREAST METASTATIC Sacituzumab govitecan-hziy Drinda Butts) q21d     01/01/2022 -  Chemotherapy   Patient is on Treatment Plan : BREAST METASTATIC Sacituzumab govitecan-hziy Drinda Butts) D1,8 q21d       CHIEF COMPLIANT: Follow-up metastatic breast cancer on Trodelvey cycle 19  INTERVAL HISTORY: Debbie [name] is a  57 year old with above-mentioned history of metastatic breast cancer currently on cycle 19 Sacituzumab-Govitecan,  She presents to the clinic for a follow-up. She reports that she doesn't feel any worst. She does feel sluggish and appetite feels a little weird. Last week she got kind of weak and sweaty and says she vomited. She says she didn't eat anything because she didn't have a urge.   ALLERGIES:  is allergic to ace inhibitors, atorvastatin, and simvastatin.  MEDICATIONS:  Current Outpatient Medications   Medication Sig Dispense Refill   acetaminophen (TYLENOL) 500 MG tablet Take 1,000 mg by mouth every 6 (six) hours as needed.     Calcium-Magnesium 250-125 MG TABS Take 1 tablet by mouth daily. 120 each 4   cetirizine (ZYRTEC) 10 MG tablet Take 10 mg by mouth daily as needed for allergies.      chlorhexidine (PERIDEX) 0.12 % solution Use 5 mLs in the mouth or throat 2 (two) times daily as needed as directed. *Do not swallow* 473 mL 6   chlorhexidine (PERIDEX) 0.12 % solution Swish using for 2 full minutes then spit. do this twice daily 473 mL 0   cyanocobalamin (VITAMIN B12) 1000 MCG tablet Take 1,000 mcg by mouth daily.     diphenoxylate-atropine (LOMOTIL) 2.5-0.025 MG tablet Take 1 tablet by mouth 4 (four) times daily as needed for diarrhea or loose stools. 30 tablet 2   flurbiprofen (ANSAID) 100 MG tablet Take by mouth.     furosemide (LASIX) 20 MG tablet Take 1 tablet (20 mg total) by mouth daily. 90 tablet 6   glucose blood (FREESTYLE LITE) test strip Use 1 strip to check blood sugar 3 (three) times daily. 50 strip 3   levothyroxine (SYNTHROID) 175 MCG tablet Take 1 tablet (175 mcg total) by mouth in the morning on an empty stomach 90 tablet 1   losartan-hydrochlorothiazide (HYZAAR) 100-25 MG tablet Take 1 tablet by mouth daily. 30 tablet 11   losartan-hydrochlorothiazide (HYZAAR) 100-25 MG tablet Take 1 tablet by mouth daily. 30  tablet 11   metFORMIN (GLUCOPHAGE-XR) 500 MG 24 hr tablet Take 1 tablet (500 mg total) by mouth every evening with meal 90 tablet 1   Multiple Vitamin (MULTIVITAMIN) capsule as directed Orally     Multiple Vitamin (MULTIVITAMIN) tablet Take 1 tablet by mouth daily.     omega-3 acid ethyl esters (LOVAZA) 1 g capsule Take 1 g by mouth 2 (two) times daily.      omeprazole (PRILOSEC) 40 MG capsule Take 1 capsule (40 mg total) by mouth daily. 30 capsule 3   ondansetron (ZOFRAN-ODT) 8 MG disintegrating tablet Dissolve 1 tablet (8 mg total) by mouth every 8 (eight)  hours as needed for nausea or vomiting. 20 tablet 0   oxyCODONE (OXY IR/ROXICODONE) 5 MG immediate release tablet Take 1 tablet (5 mg total) by mouth every 4 (four) hours as needed for severe pain. 60 tablet 0   oxyCODONE-acetaminophen (PERCOCET/ROXICET) 5-325 MG tablet Take 1 tablet by mouth every 8 (eight) hours as needed for severe pain. 20 tablet 0   potassium chloride (KLOR-CON) 10 MEQ tablet Take 3 tablets (30 mEq total) by mouth daily. 180 tablet 3   pregabalin (LYRICA) 100 MG capsule Take 1 capsule by mouth 3 times daily. 90 capsule 3   rosuvastatin (CRESTOR) 5 MG tablet Take 1 tablet (5 mg total) by mouth daily. 90 tablet 1   sacituzumab govitecan-hziy (TRODELVY) 180 MG chemo injection as directed Intravenous     sertraline (ZOLOFT) 100 MG tablet Take 1 tablet (100 mg total) by mouth daily. 90 tablet 3   valACYclovir (VALTREX) 500 MG tablet 2 tablet Orally Once a day     Vitamin D, Cholecalciferol, 1000 units CAPS Take 1 tablet by mouth daily. (Patient taking differently: Take 1,000 Units by mouth daily.) 90 capsule 4   No current facility-administered medications for this visit.    PHYSICAL EXAMINATION: ECOG PERFORMANCE STATUS: 2 - Symptomatic, <50% confined to bed  Vitals:   07/30/22 0803  BP: (!) 174/74  Pulse: (!) 103  Resp: 18  Temp: 97.8 F (36.6 C)  SpO2: 96%   Filed Weights   07/30/22 0803  Weight: 294 lb 6.4 oz (133.5 kg)      LABORATORY DATA:  I have reviewed the data as listed    Latest Ref Rng & Units 07/16/2022    8:17 AM 07/09/2022    7:52 AM 06/25/2022    7:46 AM  CMP  Glucose 70 - 99 mg/dL 644  034  742   BUN 6 - 20 mg/dL 11  11  12    Creatinine 0.44 - 1.00 mg/dL 5.95  6.38  7.56   Sodium 135 - 145 mmol/L 141  140  139   Potassium 3.5 - 5.1 mmol/L 3.5  3.1  3.4   Chloride 98 - 111 mmol/L 102  101  100   CO2 22 - 32 mmol/L 33  32  28   Calcium 8.9 - 10.3 mg/dL 9.7  9.1  8.8   Total Protein 6.5 - 8.1 g/dL 6.8  6.2  5.9   Total Bilirubin 0.3 - 1.2  mg/dL 0.4  0.4  0.5   Alkaline Phos 38 - 126 U/L 263  240  226   AST 15 - 41 U/L 38  35  48   ALT 0 - 44 U/L 47  41  39     Lab Results  Component Value Date   WBC 4.6 07/30/2022   HGB 10.8 (L) 07/30/2022   HCT 33.6 (  L) 07/30/2022   MCV 91.8 07/30/2022   PLT 224 07/30/2022   NEUTROABS 3.4 07/30/2022    ASSESSMENT & PLAN:  Malignant neoplasm of upper-inner quadrant of right breast in female, estrogen receptor positive (HCC) Metastatic Breast Cancer: ER Pos and Her 2 Neg (previously progressed on Ibrance, fulvestrant, everolimus, alpelisib, Xeloda) Prior Treatment: Taxol 09/10/2020-06/18/21 Chronic right arm lymphedema IHC: HER2 0 ----------------------------------------------------------------------------------------------------------------------- Current treatment: Palliative treatment with Sacituzumab-Govitecan, today is cycle 19   Toxicities: 1.  Neutropenia: Stable even though increase the dose 2. Fatigue and joint stiffness: There appears to be more cumulative fatigue 3.  Chemotherapy-induced anemia today's hemoglobin is improving and today it is 10.8 4.  Nausea: Takes nausea medication 5. Constipation alternating with diarrhea 6.  Dryness of the face and acne 7.  Loss of taste and appetite: Bothering her but she wants to keep the dosage stable    CT CAP 04/14/2022: Similar subpleural pulmonary nodules.  Decrease in mediastinal lymph node, stable bone metastases, regression/resolution of the central liver lesion, cirrhosis and hepatic steatosis Patient is still able to work 40 hours a week in spite of being on systemic treatments.  Because of the increase in the tumor markers we increased Trodelvy back up to 6 mg   CT CAP will be done in April Return to clinic for Trodelvy (days 1 and 8) and follow-ups every 3 weeks    Orders Placed This Encounter  Procedures   CT CHEST ABDOMEN PELVIS W CONTRAST    Standing Status:   Future    Standing Expiration Date:   07/30/2023     Order Specific Question:   If indicated for the ordered procedure, I authorize the administration of contrast media per Radiology protocol    Answer:   Yes    Order Specific Question:   Does the patient have a contrast media/X-ray dye allergy?    Answer:   No    Order Specific Question:   Is patient pregnant?    Answer:   No    Order Specific Question:   Preferred imaging location?    Answer:   [facility]    Order Specific Question:   Is Oral Contrast requested for this exam?    Answer:   Yes, Per Radiology protocol   The patient has a good understanding of the overall plan. she agrees with it. she will call with any problems that may develop before the next visit here. Total time spent: 30 mins including face to face time and time spent for planning, charting and co-ordination of care   Tamsen Meek, MD 07/30/22    I Janan Ridge am acting as a Neurosurgeon for [employer]  I have reviewed the above documentation for accuracy and completeness, and I agree with the above.

## 2022-07-29 MED FILL — Fosaprepitant Dimeglumine For IV Infusion 150 MG (Base Eq): INTRAVENOUS | Qty: 5 | Status: AC

## 2022-07-29 MED FILL — Dexamethasone Sodium Phosphate Inj 100 MG/10ML: INTRAMUSCULAR | Qty: 1 | Status: AC

## 2022-07-30 ENCOUNTER — Inpatient Hospital Stay: Payer: Commercial Managed Care - PPO

## 2022-07-30 ENCOUNTER — Inpatient Hospital Stay (HOSPITAL_BASED_OUTPATIENT_CLINIC_OR_DEPARTMENT_OTHER): Payer: Commercial Managed Care - PPO | Admitting: Hematology and Oncology

## 2022-07-30 ENCOUNTER — Encounter: Payer: Self-pay | Admitting: Hematology and Oncology

## 2022-07-30 ENCOUNTER — Other Ambulatory Visit: Payer: Self-pay

## 2022-07-30 VITALS — BP 174/74 | HR 103 | Temp 97.8°F | Resp 18 | Ht 66.0 in | Wt 294.4 lb

## 2022-07-30 VITALS — BP 132/74 | HR 86 | Resp 16

## 2022-07-30 DIAGNOSIS — Z170 Estrogen receptor positive status [ER+]: Secondary | ICD-10-CM

## 2022-07-30 DIAGNOSIS — C50112 Malignant neoplasm of central portion of left female breast: Secondary | ICD-10-CM

## 2022-07-30 DIAGNOSIS — Z9013 Acquired absence of bilateral breasts and nipples: Secondary | ICD-10-CM | POA: Diagnosis not present

## 2022-07-30 DIAGNOSIS — Z79899 Other long term (current) drug therapy: Secondary | ICD-10-CM | POA: Diagnosis not present

## 2022-07-30 DIAGNOSIS — Z5112 Encounter for antineoplastic immunotherapy: Secondary | ICD-10-CM | POA: Diagnosis not present

## 2022-07-30 DIAGNOSIS — C787 Secondary malignant neoplasm of liver and intrahepatic bile duct: Secondary | ICD-10-CM

## 2022-07-30 DIAGNOSIS — C7800 Secondary malignant neoplasm of unspecified lung: Secondary | ICD-10-CM

## 2022-07-30 DIAGNOSIS — C50211 Malignant neoplasm of upper-inner quadrant of right female breast: Secondary | ICD-10-CM

## 2022-07-30 DIAGNOSIS — C7951 Secondary malignant neoplasm of bone: Secondary | ICD-10-CM | POA: Diagnosis not present

## 2022-07-30 DIAGNOSIS — Z923 Personal history of irradiation: Secondary | ICD-10-CM | POA: Diagnosis not present

## 2022-07-30 DIAGNOSIS — Z95828 Presence of other vascular implants and grafts: Secondary | ICD-10-CM

## 2022-07-30 DIAGNOSIS — C50111 Malignant neoplasm of central portion of right female breast: Secondary | ICD-10-CM | POA: Diagnosis not present

## 2022-07-30 LAB — CBC WITH DIFFERENTIAL (CANCER CENTER ONLY)
Abs Immature Granulocytes: 0.01 10*3/uL (ref 0.00–0.07)
Basophils Absolute: 0 10*3/uL (ref 0.0–0.1)
Basophils Relative: 1 %
Eosinophils Absolute: 0.1 10*3/uL (ref 0.0–0.5)
Eosinophils Relative: 3 %
HCT: 33.6 % — ABNORMAL LOW (ref 36.0–46.0)
Hemoglobin: 10.8 g/dL — ABNORMAL LOW (ref 12.0–15.0)
Immature Granulocytes: 0 %
Lymphocytes Relative: 12 %
Lymphs Abs: 0.6 10*3/uL — ABNORMAL LOW (ref 0.7–4.0)
MCH: 29.5 pg (ref 26.0–34.0)
MCHC: 32.1 g/dL (ref 30.0–36.0)
MCV: 91.8 fL (ref 80.0–100.0)
Monocytes Absolute: 0.4 10*3/uL (ref 0.1–1.0)
Monocytes Relative: 9 %
Neutro Abs: 3.4 10*3/uL (ref 1.7–7.7)
Neutrophils Relative %: 75 %
Platelet Count: 224 10*3/uL (ref 150–400)
RBC: 3.66 MIL/uL — ABNORMAL LOW (ref 3.87–5.11)
RDW: 18.6 % — ABNORMAL HIGH (ref 11.5–15.5)
WBC Count: 4.6 10*3/uL (ref 4.0–10.5)
nRBC: 0 % (ref 0.0–0.2)

## 2022-07-30 LAB — CMP (CANCER CENTER ONLY)
ALT: 40 U/L (ref 0–44)
AST: 35 U/L (ref 15–41)
Albumin: 3.5 g/dL (ref 3.5–5.0)
Alkaline Phosphatase: 252 U/L — ABNORMAL HIGH (ref 38–126)
Anion gap: 8 (ref 5–15)
BUN: 9 mg/dL (ref 6–20)
CO2: 31 mmol/L (ref 22–32)
Calcium: 9.7 mg/dL (ref 8.9–10.3)
Chloride: 103 mmol/L (ref 98–111)
Creatinine: 0.77 mg/dL (ref 0.44–1.00)
GFR, Estimated: >60 mL/min (ref >60)
Glucose, Bld: 113 mg/dL — ABNORMAL HIGH (ref 70–99)
Potassium: 3.3 mmol/L — ABNORMAL LOW (ref 3.5–5.1)
Sodium: 142 mmol/L (ref 135–145)
Total Bilirubin: 0.4 mg/dL (ref 0.3–1.2)
Total Protein: 6.4 g/dL — ABNORMAL LOW (ref 6.5–8.1)

## 2022-07-30 LAB — PHOSPHORUS: Phosphorus: 4.8 mg/dL — ABNORMAL HIGH (ref 2.5–4.6)

## 2022-07-30 LAB — MAGNESIUM: Magnesium: 1.6 mg/dL — ABNORMAL LOW (ref 1.7–2.4)

## 2022-07-30 MED ORDER — DIPHENHYDRAMINE HCL 50 MG/ML IJ SOLN
50.0000 mg | Freq: Once | INTRAMUSCULAR | Status: AC
  Administered 2022-07-30: 50 mg via INTRAVENOUS
  Filled 2022-07-30: qty 1

## 2022-07-30 MED ORDER — PALONOSETRON HCL INJECTION 0.25 MG/5ML
0.2500 mg | Freq: Once | INTRAVENOUS | Status: AC
  Administered 2022-07-30: 0.25 mg via INTRAVENOUS
  Filled 2022-07-30: qty 5

## 2022-07-30 MED ORDER — HEPARIN SOD (PORK) LOCK FLUSH 100 UNIT/ML IV SOLN
500.0000 [IU] | Freq: Once | INTRAVENOUS | Status: AC | PRN
  Administered 2022-07-30: 500 [IU]

## 2022-07-30 MED ORDER — SODIUM CHLORIDE 0.9 % IV SOLN
10.0000 mg | Freq: Once | INTRAVENOUS | Status: AC
  Administered 2022-07-30: 10 mg via INTRAVENOUS
  Filled 2022-07-30: qty 10

## 2022-07-30 MED ORDER — SODIUM CHLORIDE 0.9 % IV SOLN
150.0000 mg | Freq: Once | INTRAVENOUS | Status: AC
  Administered 2022-07-30: 150 mg via INTRAVENOUS
  Filled 2022-07-30: qty 150

## 2022-07-30 MED ORDER — ACETAMINOPHEN 325 MG PO TABS
650.0000 mg | ORAL_TABLET | Freq: Once | ORAL | Status: AC
  Administered 2022-07-30: 650 mg via ORAL
  Filled 2022-07-30: qty 2

## 2022-07-30 MED ORDER — ATROPINE SULFATE 1 MG/ML IV SOLN: 0.5000 mg | Freq: Once | INTRAVENOUS | Status: DC | PRN

## 2022-07-30 MED ORDER — SODIUM CHLORIDE 0.9% FLUSH
10.0000 mL | INTRAVENOUS | Status: DC | PRN
  Administered 2022-07-30: 10 mL

## 2022-07-30 MED ORDER — SODIUM CHLORIDE 0.9 % IV SOLN: Freq: Once | INTRAVENOUS | Status: AC

## 2022-07-30 MED ORDER — SODIUM CHLORIDE 0.9 % IV SOLN
6.0000 mg/kg | Freq: Once | INTRAVENOUS | Status: AC
  Administered 2022-07-30: 720 mg via INTRAVENOUS
  Filled 2022-07-30: qty 72

## 2022-07-30 MED ORDER — SODIUM CHLORIDE 0.9% FLUSH
10.0000 mL | Freq: Once | INTRAVENOUS | Status: AC
  Administered 2022-07-30: 10 mL

## 2022-07-30 MED ORDER — FAMOTIDINE IN NACL 20-0.9 MG/50ML-% IV SOLN
20.0000 mg | Freq: Once | INTRAVENOUS | Status: AC
  Administered 2022-07-30: 20 mg via INTRAVENOUS
  Filled 2022-07-30: qty 50

## 2022-07-30 NOTE — Patient Instructions (Signed)
[facility]  Discharge Instructions: Thank you for choosing [facility] to provide your oncology and hematology care.   If you have a lab appointment with the Cancer Center, please go directly to the Cancer Center and check in at the registration area.   Wear comfortable clothing and clothing appropriate for easy access to any Portacath or PICC line.   We strive to give you quality time with your provider. You may need to reschedule your appointment if you arrive late (15 or more minutes).  Arriving late affects you and other patients whose appointments are after yours.  Also, if you miss three or more appointments without notifying the office, you may be dismissed from the clinic at the provider's discretion.      For prescription refill requests, have your pharmacy contact our office and allow 72 hours for refills to be completed.    Today you received the following chemotherapy and/or immunotherapy agents: Trodelvy      To help prevent nausea and vomiting after your treatment, we encourage you to take your nausea medication as directed.  BELOW ARE SYMPTOMS THAT SHOULD BE REPORTED IMMEDIATELY: *FEVER GREATER THAN 100.4 F (38 C) OR HIGHER *CHILLS OR SWEATING *NAUSEA AND VOMITING THAT IS NOT CONTROLLED WITH YOUR NAUSEA MEDICATION *UNUSUAL SHORTNESS OF BREATH *UNUSUAL BRUISING OR BLEEDING *URINARY PROBLEMS (pain or burning when urinating, or frequent urination) *BOWEL PROBLEMS (unusual diarrhea, constipation, pain near the anus) TENDERNESS IN MOUTH AND THROAT WITH OR WITHOUT PRESENCE OF ULCERS (sore throat, sores in mouth, or a toothache) UNUSUAL RASH, SWELLING OR PAIN  UNUSUAL VAGINAL DISCHARGE OR ITCHING   Items with * indicate a potential emergency and should be followed up as soon as possible or go to the Emergency Department if any problems should occur.  Please show the CHEMOTHERAPY ALERT CARD or IMMUNOTHERAPY ALERT CARD at  check-in to the Emergency Department and triage nurse.  Should you have questions after your visit or need to cancel or reschedule your appointment, please contact [facility]  Dept: [phone]  and follow the prompts.  Office hours are 8:00 a.m. to 4:30 p.m. Monday - Friday. Please note that voicemails left after 4:00 p.m. may not be returned until the following business day.  We are closed weekends and major holidays. You have access to a nurse at all times for urgent questions. Please call the main number to the clinic Dept: [phone] and follow the prompts.   For any non-urgent questions, you may also contact your provider using MyChart. We now offer e-Visits for anyone 18 and older to request care online for non-urgent symptoms. For details visit [email or web address].   Also download the MyChart app! Go to the app store, search "MyChart", open the app, select [facility], and log in with your MyChart username and password.   

## 2022-07-30 NOTE — Assessment & Plan Note (Addendum)
Metastatic Breast Cancer: ER Pos and Her 2 Neg (previously progressed on Ibrance, fulvestrant, everolimus, alpelisib, Xeloda) Prior Treatment: Taxol 09/10/2020-06/18/21 Chronic right arm lymphedema IHC: HER2 0 ----------------------------------------------------------------------------------------------------------------------- Current treatment: Palliative treatment with Sacituzumab-Govitecan, today is cycle 19   Toxicities: 1.  Neutropenia: We reduced the dosage of Trodelvy with cycle 2 2. Fatigue and joint stiffness: There appears to be more cumulative fatigue 3.  Chemotherapy-induced anemia today's hemoglobin is improving and today it is 10.8 4.  Nausea: Takes nausea medication 5. Constipation alternating with diarrhea 6.  Dryness of the face and acne 7.  Loss of taste and appetite: Bothering her but she wants to keep the dosage stable    CT CAP 04/14/2022: Similar subpleural pulmonary nodules.  Decrease in mediastinal lymph node, stable bone metastases, regression/resolution of the central liver lesion, cirrhosis and hepatic steatosis   Because of the increase in the tumor markers are recommended that we increase the dosage of Trodelvy back up to 6 mg   We will consider repeating scans again in April. Return to clinic for Trodelvy (days 1 and 8) and follow-ups every 3 weeks

## 2022-07-30 NOTE — Progress Notes (Signed)
Dr Lindi Adie notified of Magnesium level 1.6. No further action is required per Dr Lindi Adie.

## 2022-07-31 ENCOUNTER — Other Ambulatory Visit (HOSPITAL_COMMUNITY): Payer: Self-pay

## 2022-07-31 LAB — CANCER ANTIGEN 27.29: CA 27.29: 193.8 U/mL — ABNORMAL HIGH (ref 0.0–38.6)

## 2022-08-05 ENCOUNTER — Other Ambulatory Visit: Payer: Self-pay | Admitting: Hematology and Oncology

## 2022-08-05 ENCOUNTER — Other Ambulatory Visit (HOSPITAL_COMMUNITY): Payer: Self-pay

## 2022-08-05 ENCOUNTER — Encounter: Payer: Self-pay | Admitting: Hematology and Oncology

## 2022-08-05 ENCOUNTER — Encounter: Payer: Self-pay | Admitting: *Deleted

## 2022-08-05 MED ORDER — OMEPRAZOLE 40 MG PO CPDR
40.0000 mg | DELAYED_RELEASE_CAPSULE | Freq: Every day | ORAL | 3 refills | Status: DC
  Filled 2022-08-05: qty 90, 90d supply, fill #0

## 2022-08-05 MED FILL — Fosaprepitant Dimeglumine For IV Infusion 150 MG (Base Eq): INTRAVENOUS | Qty: 5 | Status: AC

## 2022-08-05 MED FILL — Dexamethasone Sodium Phosphate Inj 100 MG/10ML: INTRAMUSCULAR | Qty: 1 | Status: AC

## 2022-08-06 ENCOUNTER — Inpatient Hospital Stay: Payer: Commercial Managed Care - PPO

## 2022-08-06 VITALS — BP 135/72 | HR 87 | Temp 98.0°F | Resp 18

## 2022-08-06 DIAGNOSIS — C50112 Malignant neoplasm of central portion of left female breast: Secondary | ICD-10-CM

## 2022-08-06 DIAGNOSIS — Z170 Estrogen receptor positive status [ER+]: Secondary | ICD-10-CM

## 2022-08-06 DIAGNOSIS — Z923 Personal history of irradiation: Secondary | ICD-10-CM | POA: Diagnosis not present

## 2022-08-06 DIAGNOSIS — C787 Secondary malignant neoplasm of liver and intrahepatic bile duct: Secondary | ICD-10-CM

## 2022-08-06 DIAGNOSIS — Z5112 Encounter for antineoplastic immunotherapy: Secondary | ICD-10-CM | POA: Diagnosis not present

## 2022-08-06 DIAGNOSIS — C7800 Secondary malignant neoplasm of unspecified lung: Secondary | ICD-10-CM | POA: Diagnosis not present

## 2022-08-06 DIAGNOSIS — C7951 Secondary malignant neoplasm of bone: Secondary | ICD-10-CM | POA: Diagnosis not present

## 2022-08-06 DIAGNOSIS — Z9013 Acquired absence of bilateral breasts and nipples: Secondary | ICD-10-CM | POA: Diagnosis not present

## 2022-08-06 DIAGNOSIS — C50211 Malignant neoplasm of upper-inner quadrant of right female breast: Secondary | ICD-10-CM | POA: Diagnosis not present

## 2022-08-06 DIAGNOSIS — Z79899 Other long term (current) drug therapy: Secondary | ICD-10-CM | POA: Diagnosis not present

## 2022-08-06 LAB — CBC WITH DIFFERENTIAL (CANCER CENTER ONLY)
Abs Immature Granulocytes: 0.04 10*3/uL (ref 0.00–0.07)
Basophils Absolute: 0 10*3/uL (ref 0.0–0.1)
Basophils Relative: 1 %
Eosinophils Absolute: 0.1 10*3/uL (ref 0.0–0.5)
Eosinophils Relative: 3 %
HCT: 33.4 % — ABNORMAL LOW (ref 36.0–46.0)
Hemoglobin: 11 g/dL — ABNORMAL LOW (ref 12.0–15.0)
Immature Granulocytes: 1 %
Lymphocytes Relative: 14 %
Lymphs Abs: 0.6 10*3/uL — ABNORMAL LOW (ref 0.7–4.0)
MCH: 29.8 pg (ref 26.0–34.0)
MCHC: 32.9 g/dL (ref 30.0–36.0)
MCV: 90.5 fL (ref 80.0–100.0)
Monocytes Absolute: 0.4 10*3/uL (ref 0.1–1.0)
Monocytes Relative: 10 %
Neutro Abs: 3.3 10*3/uL (ref 1.7–7.7)
Neutrophils Relative %: 71 %
Platelet Count: 240 10*3/uL (ref 150–400)
RBC: 3.69 MIL/uL — ABNORMAL LOW (ref 3.87–5.11)
RDW: 18.3 % — ABNORMAL HIGH (ref 11.5–15.5)
WBC Count: 4.6 10*3/uL (ref 4.0–10.5)
nRBC: 0 % (ref 0.0–0.2)

## 2022-08-06 LAB — CMP (CANCER CENTER ONLY)
ALT: 44 U/L (ref 0–44)
AST: 41 U/L (ref 15–41)
Albumin: 3.6 g/dL (ref 3.5–5.0)
Alkaline Phosphatase: 238 U/L — ABNORMAL HIGH (ref 38–126)
Anion gap: 7 (ref 5–15)
BUN: 11 mg/dL (ref 6–20)
CO2: 32 mmol/L (ref 22–32)
Calcium: 10.1 mg/dL (ref 8.9–10.3)
Chloride: 100 mmol/L (ref 98–111)
Creatinine: 0.76 mg/dL (ref 0.44–1.00)
GFR, Estimated: >60 mL/min (ref >60)
Glucose, Bld: 109 mg/dL — ABNORMAL HIGH (ref 70–99)
Potassium: 3.4 mmol/L — ABNORMAL LOW (ref 3.5–5.1)
Sodium: 139 mmol/L (ref 135–145)
Total Bilirubin: 0.5 mg/dL (ref 0.3–1.2)
Total Protein: 7 g/dL (ref 6.5–8.1)

## 2022-08-06 LAB — PHOSPHORUS: Phosphorus: 4.9 mg/dL — ABNORMAL HIGH (ref 2.5–4.6)

## 2022-08-06 LAB — MAGNESIUM: Magnesium: 1.8 mg/dL (ref 1.7–2.4)

## 2022-08-06 MED ORDER — DIPHENHYDRAMINE HCL 50 MG/ML IJ SOLN
50.0000 mg | Freq: Once | INTRAMUSCULAR | Status: AC
  Administered 2022-08-06: 50 mg via INTRAVENOUS
  Filled 2022-08-06: qty 1

## 2022-08-06 MED ORDER — SODIUM CHLORIDE 0.9 % IV SOLN
150.0000 mg | Freq: Once | INTRAVENOUS | Status: AC
  Administered 2022-08-06: 150 mg via INTRAVENOUS
  Filled 2022-08-06: qty 150

## 2022-08-06 MED ORDER — SODIUM CHLORIDE 0.9% FLUSH
10.0000 mL | INTRAVENOUS | Status: DC | PRN
  Administered 2022-08-06: 10 mL

## 2022-08-06 MED ORDER — PALONOSETRON HCL INJECTION 0.25 MG/5ML
0.2500 mg | Freq: Once | INTRAVENOUS | Status: AC
  Administered 2022-08-06: 0.25 mg via INTRAVENOUS
  Filled 2022-08-06: qty 5

## 2022-08-06 MED ORDER — SODIUM CHLORIDE 0.9 % IV SOLN: Freq: Once | INTRAVENOUS | Status: AC

## 2022-08-06 MED ORDER — HEPARIN SOD (PORK) LOCK FLUSH 100 UNIT/ML IV SOLN
500.0000 [IU] | Freq: Once | INTRAVENOUS | Status: AC | PRN
  Administered 2022-08-06: 500 [IU]

## 2022-08-06 MED ORDER — FAMOTIDINE IN NACL 20-0.9 MG/50ML-% IV SOLN
20.0000 mg | Freq: Once | INTRAVENOUS | Status: AC
  Administered 2022-08-06: 20 mg via INTRAVENOUS
  Filled 2022-08-06: qty 50

## 2022-08-06 MED ORDER — ACETAMINOPHEN 325 MG PO TABS
650.0000 mg | ORAL_TABLET | Freq: Once | ORAL | Status: AC
  Administered 2022-08-06: 650 mg via ORAL
  Filled 2022-08-06: qty 2

## 2022-08-06 MED ORDER — SODIUM CHLORIDE 0.9 % IV SOLN
10.0000 mg | Freq: Once | INTRAVENOUS | Status: AC
  Administered 2022-08-06: 10 mg via INTRAVENOUS
  Filled 2022-08-06: qty 10

## 2022-08-06 MED ORDER — SODIUM CHLORIDE 0.9 % IV SOLN
6.0000 mg/kg | Freq: Once | INTRAVENOUS | Status: AC
  Administered 2022-08-06: 720 mg via INTRAVENOUS
  Filled 2022-08-06: qty 72

## 2022-08-06 NOTE — Progress Notes (Signed)
Pt declined 30 min post trodelvy observation

## 2022-08-11 ENCOUNTER — Other Ambulatory Visit: Payer: Self-pay

## 2022-08-12 ENCOUNTER — Other Ambulatory Visit: Payer: Self-pay

## 2022-08-12 ENCOUNTER — Other Ambulatory Visit: Payer: Self-pay | Admitting: Hematology and Oncology

## 2022-08-12 MED ORDER — PREGABALIN 100 MG PO CAPS: 100.0000 mg | ORAL_CAPSULE | Freq: Three times a day (TID) | ORAL | 3 refills | Status: DC

## 2022-08-13 ENCOUNTER — Other Ambulatory Visit (HOSPITAL_COMMUNITY): Payer: Self-pay

## 2022-08-13 ENCOUNTER — Ambulatory Visit (HOSPITAL_COMMUNITY)
Admission: RE | Admit: 2022-08-13 | Discharge: 2022-08-13 | Disposition: A | Discharge status: Home / Self Care | Payer: Commercial Managed Care - PPO | Source: Ambulatory Visit | Attending: Hematology and Oncology | Admitting: Hematology and Oncology

## 2022-08-13 DIAGNOSIS — Z170 Estrogen receptor positive status [ER+]: Secondary | ICD-10-CM | POA: Diagnosis not present

## 2022-08-13 DIAGNOSIS — K7689 Other specified diseases of liver: Secondary | ICD-10-CM | POA: Diagnosis not present

## 2022-08-13 DIAGNOSIS — C787 Secondary malignant neoplasm of liver and intrahepatic bile duct: Secondary | ICD-10-CM | POA: Insufficient documentation

## 2022-08-13 DIAGNOSIS — C7800 Secondary malignant neoplasm of unspecified lung: Secondary | ICD-10-CM | POA: Insufficient documentation

## 2022-08-13 DIAGNOSIS — R918 Other nonspecific abnormal finding of lung field: Secondary | ICD-10-CM | POA: Diagnosis not present

## 2022-08-13 DIAGNOSIS — C50211 Malignant neoplasm of upper-inner quadrant of right female breast: Secondary | ICD-10-CM | POA: Insufficient documentation

## 2022-08-13 DIAGNOSIS — C50111 Malignant neoplasm of central portion of right female breast: Secondary | ICD-10-CM | POA: Insufficient documentation

## 2022-08-13 DIAGNOSIS — C50112 Malignant neoplasm of central portion of left female breast: Secondary | ICD-10-CM | POA: Insufficient documentation

## 2022-08-13 DIAGNOSIS — C7989 Secondary malignant neoplasm of other specified sites: Secondary | ICD-10-CM | POA: Diagnosis not present

## 2022-08-13 MED ORDER — PREGABALIN 100 MG PO CAPS
100.0000 mg | ORAL_CAPSULE | Freq: Three times a day (TID) | ORAL | 3 refills | Status: DC
  Filled 2022-08-13: qty 90, 30d supply, fill #0

## 2022-08-13 MED ORDER — HEPARIN SOD (PORK) LOCK FLUSH 100 UNIT/ML IV SOLN
INTRAVENOUS | Status: AC
  Filled 2022-08-13: qty 5

## 2022-08-13 MED ORDER — SODIUM CHLORIDE (PF) 0.9 % IJ SOLN
INTRAMUSCULAR | Status: AC
  Filled 2022-08-13: qty 50

## 2022-08-13 MED ORDER — IOHEXOL 300 MG/ML  SOLN
100.0000 mL | Freq: Once | INTRAMUSCULAR | Status: AC | PRN
  Administered 2022-08-13: 100 mL via INTRAVENOUS

## 2022-08-13 MED ORDER — HEPARIN SOD (PORK) LOCK FLUSH 100 UNIT/ML IV SOLN
500.0000 [IU] | Freq: Once | INTRAVENOUS | Status: AC
  Administered 2022-08-13: 500 [IU] via INTRAVENOUS

## 2022-08-19 MED FILL — Dexamethasone Sodium Phosphate Inj 100 MG/10ML: INTRAMUSCULAR | Qty: 1 | Status: AC

## 2022-08-19 MED FILL — Fosaprepitant Dimeglumine For IV Infusion 150 MG (Base Eq): INTRAVENOUS | Qty: 5 | Status: AC

## 2022-08-19 NOTE — Progress Notes (Signed)
Patient Care Team: Maurice Small, MD as PCP - General (Family Medicine) Dorisann Frames, MD as Consulting Physician (Endocrinology) Griselda Miner, MD as Consulting Physician (General Surgery) Lonie Peak, MD as Attending Physician (Radiation Oncology) Lyn Records, MD (Inactive) as Consulting Physician (Cardiology) Axel Filler Larna Daughters, NP as Nurse Practitioner (Hematology and Oncology) Gertie Baron, DMD (Dentistry) Anselm Lis, RPH-CPP (Pharmacist) Serena Croissant, MD as Consulting Physician (Hematology and Oncology)  DIAGNOSIS: No diagnosis found.  SUMMARY OF ONCOLOGIC HISTORY: Oncology History  Malignant neoplasm of upper-inner quadrant of right breast in female, estrogen receptor positive  10/15/2015 Initial Biopsy   Right breast upper inner quadrant biopsy: IDC, DCIS, grade 2, ER+(90%), PR+(50%), Ki67 70%, HER-2 negative (ratio 1.18).   10/17/2015 Initial Diagnosis   Malignant neoplasm of upper-inner quadrant of right breast in female, estrogen receptor positive (HCC)   10/2015 -  Anti-estrogen oral therapy   Tamoxifen daily   11/28/2015 Surgery   Left breast mammoplasty: IDC, grade 1, 1.2cm, DCIS, lobular neoplasia, T1c, Nx, ER 80%, PR60%, Ki67 3% HER-2 neg Right breast lumpectomy: IDC, grade 1, 2.3cm, DCIS. T2N1a ER/PR+HER-2 neg, Ki 70%.   11/28/2015 Miscellaneous   Mammaprint was Low Risk. Predicting a 98% chance of no disease recurrence within 5 years with anti-estrogens as the only systemic therapy.  It also predicts minimal to no benefit from chemotherapy.     12/26/2015 Surgery   Bilateral mastectomies: bilateral sentinel node sampling, Right: no residual carcinoma, left, focal DCIS, 4 SLN negative   02/11/2016 - 03/27/2016 Radiation Therapy   Adjuvant radiation Basilio Cairo): 1) Right chest wall: 50.4 Gy in 28 fractions  2) Right chest wall boost: 10 Gy in 5 fractions   08/04/2017 Relapse/Recurrence   08/04/2017, MRI of the liver and total spine MRI  08/11/2017 showed liver and bone metastasis; liver biopsy 09/07/2017 confirms estrogen receptor positive, progesterone receptor negative, HER-2 negative metastatic breast cancer; chest CT scan 08/13/2017 shows multiple bilateral pulmonary nodules.   08/17/2017 - 01/04/2018 Anti-estrogen oral therapy   fulvestrant  palbociclib started 08/31/2017 discontinued 01/04/2018 with evidence of progression in the liver   08/2017 Miscellaneous   [facility] report on liver biopsy from April 2019 shows a negative PDL 1, stable MSI and proficient mismatch repair status.  The tumor is positive for the androgen receptor, and for PTEN for PIK3 CA mutations.  HER-2/neu was read as equivocal (it was negative by FISH on the pathology report).   01/04/2018 Miscellaneous   anastrozole started 01/04/2018 everolimus started 01/29/2018   02/16/2018 - 03/01/2018 Radiation Therapy   01/28/18 prophylactic left femur intramedullary nail surgery for impending left femur pathologic fracture alliative radiation with Dr. Basilio Cairo to the left femur and left ilium from 02/16/2018 to 03/01/2018   01/30/2020 - 05/2020 Anti-estrogen oral therapy   alpelisib started 01/30/2020 at 300 mg daily with Metformin 500 mg daily and Faslodex (stopped for progression)   05/22/2020 - 08/2020 Chemotherapy   Xeloda discontinued for progression   09/10/2020 - 06/18/2021 Chemotherapy   Patient is on Treatment Plan : BREAST Paclitaxel D1,D15 q28d     07/17/2021 - 12/18/2021 Chemotherapy   Patient is on Treatment Plan : BREAST METASTATIC Sacituzumab govitecan-hziy Drinda Butts) q21d     01/01/2022 -  Chemotherapy   Patient is on Treatment Plan : BREAST METASTATIC Sacituzumab govitecan-hziy Drinda Butts) D1,8 q21d     Bilateral malignant neoplasm of breast in female, estrogen receptor positive  12/26/2015 Initial Diagnosis   Bilateral malignant neoplasm of breast in female, estrogen receptor positive (HCC)  07/17/2021 - 12/18/2021 Chemotherapy   Patient is on Treatment  Plan : BREAST METASTATIC Sacituzumab govitecan-hziy Drinda Butts(Trodelvy) q21d     01/01/2022 -  Chemotherapy   Patient is on Treatment Plan : BREAST METASTATIC Sacituzumab govitecan-hziy Drinda Butts(Trodelvy) D1,8 q21d     Malignant neoplasm metastatic to liver  08/13/2017 Initial Diagnosis   Liver metastases (HCC)   07/17/2021 - 12/18/2021 Chemotherapy   Patient is on Treatment Plan : BREAST METASTATIC Sacituzumab govitecan-hziy Drinda Butts(Trodelvy) q21d     01/01/2022 -  Chemotherapy   Patient is on Treatment Plan : BREAST METASTATIC Sacituzumab govitecan-hziy Drinda Butts(Trodelvy) D1,8 q21d     Malignant neoplasm metastatic to lung  09/10/2020 Initial Diagnosis   Lung metastases (HCC)   07/17/2021 - 12/18/2021 Chemotherapy   Patient is on Treatment Plan : BREAST METASTATIC Sacituzumab govitecan-hziy Drinda Butts(Trodelvy) q21d     01/01/2022 -  Chemotherapy   Patient is on Treatment Plan : BREAST METASTATIC Sacituzumab govitecan-hziy Drinda Butts(Trodelvy) D1,8 q21d       CHIEF COMPLIANT:   INTERVAL HISTORY: Horald Chestnutatalie S Cleverly is a   ALLERGIES:  is allergic to ace inhibitors, atorvastatin, and simvastatin.  MEDICATIONS:  Current Outpatient Medications  Medication Sig Dispense Refill   acetaminophen (TYLENOL) 500 MG tablet Take 1,000 mg by mouth every 6 (six) hours as needed.     Calcium-Magnesium 250-125 MG TABS Take 1 tablet by mouth daily. 120 each 4   cetirizine (ZYRTEC) 10 MG tablet Take 10 mg by mouth daily as needed for allergies.      chlorhexidine (PERIDEX) 0.12 % solution Use 5 mLs in the mouth or throat 2 (two) times daily as needed as directed. ****Do not swallow**** 473 mL 6   chlorhexidine (PERIDEX) 0.12 % solution Swish using 15mls for 2 full minutes then spit. do this twice daily 473 mL 0   cyanocobalamin (VITAMIN B12) 1000 MCG tablet Take 1,000 mcg by mouth daily.     diphenoxylate-atropine (LOMOTIL) 2.5-0.025 MG tablet Take 1 tablet by mouth 4 (four) times daily as needed for diarrhea or loose stools. 30 tablet 2   flurbiprofen  (ANSAID) 100 MG tablet Take by mouth.     furosemide (LASIX) 20 MG tablet Take 1 tablet (20 mg total) by mouth daily. 90 tablet 6   glucose blood (FREESTYLE LITE) test strip Use 1 strip to check blood sugar 3 (three) times daily. 50 strip 3   levothyroxine (SYNTHROID) 175 MCG tablet Take 1 tablet (175 mcg total) by mouth in the morning on an empty stomach 90 tablet 1   losartan-hydrochlorothiazide (HYZAAR) 100-25 MG tablet Take 1 tablet by mouth daily. 30 tablet 11   losartan-hydrochlorothiazide (HYZAAR) 100-25 MG tablet Take 1 tablet by mouth daily. 30 tablet 11   metFORMIN (GLUCOPHAGE-XR) 500 MG 24 hr tablet Take 1 tablet (500 mg total) by mouth every evening with meal 90 tablet 1   Multiple Vitamin (MULTIVITAMIN) capsule as directed Orally     Multiple Vitamin (MULTIVITAMIN) tablet Take 1 tablet by mouth daily.     omega-3 acid ethyl esters (LOVAZA) 1 g capsule Take 1 g by mouth 2 (two) times daily.      omeprazole (PRILOSEC) 40 MG capsule Take 1 capsule (40 mg total) by mouth daily. 90 capsule 3   ondansetron (ZOFRAN-ODT) 8 MG disintegrating tablet Dissolve 1 tablet (8 mg total) by mouth every 8 (eight) hours as needed for nausea or vomiting. 20 tablet 0   oxyCODONE (OXY IR/ROXICODONE) 5 MG immediate release tablet Take 1 tablet (5 mg total) by  mouth every 4 (four) hours as needed for severe pain. 60 tablet 0   oxyCODONE-acetaminophen (PERCOCET/ROXICET) 5-325 MG tablet Take 1 tablet by mouth every 8 (eight) hours as needed for severe pain. 20 tablet 0   potassium chloride (KLOR-CON) 10 MEQ tablet Take 3 tablets (30 mEq total) by mouth daily. 180 tablet 3   pregabalin (LYRICA) 100 MG capsule Take 1 capsule by mouth 3 times daily. 90 capsule 3   pregabalin (LYRICA) 100 MG capsule Take 1 capsule (100 mg total) by mouth 3 (three) times daily. 90 capsule 3   rosuvastatin (CRESTOR) 5 MG tablet Take 1 tablet (5 mg total) by mouth daily. 90 tablet 1   sacituzumab govitecan-hziy (TRODELVY) 180 MG chemo  injection as directed Intravenous     sertraline (ZOLOFT) 100 MG tablet Take 1 tablet (100 mg total) by mouth daily. 90 tablet 3   valACYclovir (VALTREX) 500 MG tablet 2 tablet Orally Once a day     Vitamin D, Cholecalciferol, 1000 units CAPS Take 1 tablet by mouth daily. (Patient taking differently: Take 1,000 Units by mouth daily.) 90 capsule 4   No current facility-administered medications for this visit.    PHYSICAL EXAMINATION: ECOG PERFORMANCE STATUS: {CHL ONC ECOG [phone]}  There were no vitals filed for this visit. There were no vitals filed for this visit.  BREAST:*** No palpable masses or nodules in either right or left breasts. No palpable axillary supraclavicular or infraclavicular adenopathy no breast tenderness or nipple discharge. (exam performed in the presence of a chaperone)  LABORATORY DATA:  I have reviewed the data as listed    Latest Ref Rng & Units 08/06/2022    8:47 AM 07/30/2022    7:44 AM 07/16/2022    8:17 AM  CMP  Glucose 70 - 99 mg/dL 161  096  045   BUN 6 - 20 mg/dL Creatinine 0.44 - 1.00 mg/dL 4.09  8.11  9.14   Sodium 135 - 145 mmol/L 139  142  141   Potassium 3.5 - 5.1 mmol/L 3.4  3.3  3.5   Chloride 98 - 111 mmol/L 100  103  102   CO2 22 - 32 mmol/L 32  31  33   Calcium 8.9 - 10.3 mg/dL 78.2  9.7  9.7   Total Protein 6.5 - 8.1 g/dL 7.0  6.4  6.8   Total Bilirubin 0.3 - 1.2 mg/dL 0.5  0.4  0.4   Alkaline Phos 38 - 126 U/L 238  252  263   AST 15 - 41 U/L 41  35  38   ALT 0 - 44 U/L 44  40  47     Lab Results  Component Value Date   WBC 4.6 08/06/2022   HGB 11.0 (L) 08/06/2022   HCT 33.4 (L) 08/06/2022   MCV 90.5 08/06/2022   PLT 240 08/06/2022   NEUTROABS 3.3 08/06/2022    ASSESSMENT & PLAN:  No problem-specific Assessment & Plan notes found for this encounter.    No orders of the defined types were placed in this encounter.  The patient has a good understanding of the overall plan. she agrees with it. she will call  with any problems that may develop before the next visit here. Total time spent: 30 mins including face to face time and time spent for planning, charting and co-ordination of care   Sherlyn Lick, CMA 08/19/22    I Tavonte Seybold, Walt Geathers am acting as a  scribe for Dr.   ***

## 2022-08-20 ENCOUNTER — Inpatient Hospital Stay: Payer: Commercial Managed Care - PPO

## 2022-08-20 ENCOUNTER — Inpatient Hospital Stay: Payer: Commercial Managed Care - PPO | Attending: Oncology | Admitting: Hematology and Oncology

## 2022-08-20 VITALS — BP 137/69 | HR 103 | Temp 97.5°F | Resp 18 | Ht 66.0 in | Wt 287.5 lb

## 2022-08-20 DIAGNOSIS — C787 Secondary malignant neoplasm of liver and intrahepatic bile duct: Secondary | ICD-10-CM | POA: Insufficient documentation

## 2022-08-20 DIAGNOSIS — C50211 Malignant neoplasm of upper-inner quadrant of right female breast: Secondary | ICD-10-CM | POA: Insufficient documentation

## 2022-08-20 DIAGNOSIS — C7800 Secondary malignant neoplasm of unspecified lung: Secondary | ICD-10-CM | POA: Diagnosis not present

## 2022-08-20 DIAGNOSIS — Z5112 Encounter for antineoplastic immunotherapy: Secondary | ICD-10-CM | POA: Diagnosis not present

## 2022-08-20 DIAGNOSIS — Z79899 Other long term (current) drug therapy: Secondary | ICD-10-CM | POA: Insufficient documentation

## 2022-08-20 DIAGNOSIS — C50112 Malignant neoplasm of central portion of left female breast: Secondary | ICD-10-CM

## 2022-08-20 DIAGNOSIS — Z9013 Acquired absence of bilateral breasts and nipples: Secondary | ICD-10-CM | POA: Diagnosis not present

## 2022-08-20 DIAGNOSIS — Z170 Estrogen receptor positive status [ER+]: Secondary | ICD-10-CM

## 2022-08-20 DIAGNOSIS — C50111 Malignant neoplasm of central portion of right female breast: Secondary | ICD-10-CM | POA: Diagnosis not present

## 2022-08-20 DIAGNOSIS — C7951 Secondary malignant neoplasm of bone: Secondary | ICD-10-CM | POA: Insufficient documentation

## 2022-08-20 DIAGNOSIS — Z95828 Presence of other vascular implants and grafts: Secondary | ICD-10-CM

## 2022-08-20 LAB — CBC WITH DIFFERENTIAL (CANCER CENTER ONLY)
Abs Immature Granulocytes: 0.03 10*3/uL (ref 0.00–0.07)
Basophils Absolute: 0 10*3/uL (ref 0.0–0.1)
Basophils Relative: 1 %
Eosinophils Absolute: 0.1 10*3/uL (ref 0.0–0.5)
Eosinophils Relative: 3 %
HCT: 36.2 % (ref 36.0–46.0)
Hemoglobin: 11.7 g/dL — ABNORMAL LOW (ref 12.0–15.0)
Immature Granulocytes: 1 %
Lymphocytes Relative: 19 %
Lymphs Abs: 0.8 10*3/uL (ref 0.7–4.0)
MCH: 29.6 pg (ref 26.0–34.0)
MCHC: 32.3 g/dL (ref 30.0–36.0)
MCV: 91.6 fL (ref 80.0–100.0)
Monocytes Absolute: 0.4 10*3/uL (ref 0.1–1.0)
Monocytes Relative: 9 %
Neutro Abs: 2.9 10*3/uL (ref 1.7–7.7)
Neutrophils Relative %: 67 %
Platelet Count: 259 10*3/uL (ref 150–400)
RBC: 3.95 MIL/uL (ref 3.87–5.11)
RDW: 19.2 % — ABNORMAL HIGH (ref 11.5–15.5)
WBC Count: 4.3 10*3/uL (ref 4.0–10.5)
nRBC: 0 % (ref 0.0–0.2)

## 2022-08-20 LAB — CMP (CANCER CENTER ONLY)
ALT: 275 U/L — ABNORMAL HIGH (ref 0–44)
AST: 205 U/L (ref 15–41)
Albumin: 3.7 g/dL (ref 3.5–5.0)
Alkaline Phosphatase: 453 U/L — ABNORMAL HIGH (ref 38–126)
Anion gap: 9 (ref 5–15)
BUN: 7 mg/dL (ref 6–20)
CO2: 32 mmol/L (ref 22–32)
Calcium: 10.4 mg/dL — ABNORMAL HIGH (ref 8.9–10.3)
Chloride: 100 mmol/L (ref 98–111)
Creatinine: 0.81 mg/dL (ref 0.44–1.00)
GFR, Estimated: >60 mL/min (ref >60)
Glucose, Bld: 124 mg/dL — ABNORMAL HIGH (ref 70–99)
Potassium: 3 mmol/L — ABNORMAL LOW (ref 3.5–5.1)
Sodium: 141 mmol/L (ref 135–145)
Total Bilirubin: 3.5 mg/dL — ABNORMAL HIGH (ref 0.3–1.2)
Total Protein: 7.3 g/dL (ref 6.5–8.1)

## 2022-08-20 LAB — PHOSPHORUS: Phosphorus: 4.5 mg/dL (ref 2.5–4.6)

## 2022-08-20 LAB — MAGNESIUM: Magnesium: 1.7 mg/dL (ref 1.7–2.4)

## 2022-08-20 MED ORDER — SODIUM CHLORIDE 0.9% FLUSH
10.0000 mL | Freq: Once | INTRAVENOUS | Status: AC
  Administered 2022-08-20: 10 mL

## 2022-08-20 NOTE — Progress Notes (Signed)
Per Dr Pamelia Hoit, no treatment today due to AST/ALT.  Pt aware and encouraged to drink fluids and return next week as scheduled.

## 2022-08-20 NOTE — Assessment & Plan Note (Signed)
Metastatic Breast Cancer: ER Pos and Her 2 Neg (previously progressed on Ibrance, fulvestrant, everolimus, alpelisib, Xeloda) Prior Treatment: Taxol 09/10/2020-06/18/21 Chronic right arm lymphedema IHC: HER2 0 ----------------------------------------------------------------------------------------------------------------------- Current treatment: Palliative treatment with Sacituzumab-Govitecan, today is cycle 19   Toxicities: 1.  Neutropenia: Stable even though increase the dose 2. Fatigue and joint stiffness: There appears to be more cumulative fatigue 3.  Chemotherapy-induced anemia today's hemoglobin is improving and today it is 10.8 4.  Nausea: Takes nausea medication 5. Constipation alternating with diarrhea 6.  Dryness of the face and acne 7.  Loss of taste and appetite: Bothering her but she wants to keep the dosage stable  CT CAP 08/13/2022: Interval increase in size of bilateral pulmonary nodules from 9 mm to 11 mm, 5 mm to 7 mm, 6 mm to 7 mm, lingula: 8 mm, hilar node 8 mm to 12 mm, interval increase in size of right hilar lymph node, stable bone metastases, increase in size of periaortic retroperitoneal lymph nodes 5 mm to 14 mm  Recommendation: I suggested that we perform Guardant360 to determine if she has any actionable mutations to determine the next best appropriate treatment.

## 2022-08-20 NOTE — Progress Notes (Signed)
CRITICAL VALUE STICKER  CRITICAL VALUE: AST [id number]  RECEIVER (on-site recipient of call): Grier Rocher  DATE & TIME NOTIFIED: 08/20/22 0919  MD NOTIFIED: Serena Croissant  TIME OF NOTIFICATION: 0925  RESPONSE:  Hold off on today's treatment, Pt will keep appts for next week to evaluate labs again per MD

## 2022-08-25 ENCOUNTER — Other Ambulatory Visit: Payer: Self-pay | Admitting: *Deleted

## 2022-08-25 ENCOUNTER — Telehealth: Payer: Self-pay | Admitting: *Deleted

## 2022-08-25 DIAGNOSIS — C787 Secondary malignant neoplasm of liver and intrahepatic bile duct: Secondary | ICD-10-CM | POA: Diagnosis not present

## 2022-08-25 DIAGNOSIS — C50211 Malignant neoplasm of upper-inner quadrant of right female breast: Secondary | ICD-10-CM | POA: Diagnosis not present

## 2022-08-25 DIAGNOSIS — M25511 Pain in right shoulder: Secondary | ICD-10-CM

## 2022-08-25 DIAGNOSIS — Z170 Estrogen receptor positive status [ER+]: Secondary | ICD-10-CM | POA: Diagnosis not present

## 2022-08-25 DIAGNOSIS — C7951 Secondary malignant neoplasm of bone: Secondary | ICD-10-CM | POA: Diagnosis not present

## 2022-08-25 NOTE — Progress Notes (Signed)
Patient Care Team: Ollen Bowl, MD as PCP - General (Internal Medicine) Dorisann Frames, MD as Consulting Physician (Endocrinology) Griselda Miner, MD as Consulting Physician (General Surgery) Lonie Peak, MD as Attending Physician (Radiation Oncology) Lyn Records, MD (Inactive) as Consulting Physician (Cardiology) Axel Filler Larna Daughters, NP as Nurse Practitioner (Hematology and Oncology) Gertie Baron, DMD (Dentistry) Anselm Lis, RPH-CPP (Pharmacist) Serena Croissant, MD as Consulting Physician (Hematology and Oncology)  DIAGNOSIS:  Encounter Diagnoses  Name Primary?   Malignant neoplasm of upper-inner quadrant of right breast in female, estrogen receptor positive Yes   Hyperbilirubinemia     SUMMARY OF ONCOLOGIC HISTORY: Oncology History  Malignant neoplasm of upper-inner quadrant of right breast in female, estrogen receptor positive  10/15/2015 Initial Biopsy   Right breast upper inner quadrant biopsy: IDC, DCIS, grade 2, ER+(90%), PR+(50%), Ki67 70%, HER-2 negative (ratio 1.18).   10/17/2015 Initial Diagnosis   Malignant neoplasm of upper-inner quadrant of right breast in female, estrogen receptor positive (HCC)   10/2015 -  Anti-estrogen oral therapy   Tamoxifen daily   11/28/2015 Surgery   Left breast mammoplasty: IDC, grade 1, 1.2cm, DCIS, lobular neoplasia, T1c, Nx, ER 80%, PR60%, Ki67 3% HER-2 neg Right breast lumpectomy: IDC, grade 1, 2.3cm, DCIS. T2N1a ER/PR+HER-2 neg, Ki 70%.   11/28/2015 Miscellaneous   Mammaprint was Low Risk. Predicting a 98% chance of no disease recurrence within 5 years with anti-estrogens as the only systemic therapy.  It also predicts minimal to no benefit from chemotherapy.     12/26/2015 Surgery   Bilateral mastectomies: bilateral sentinel node sampling, Right: no residual carcinoma, left, focal DCIS, 4 SLN negative   02/11/2016 - 03/27/2016 Radiation Therapy   Adjuvant radiation Basilio Cairo): 1) Right chest wall: 50.4 Gy in 28  fractions  2) Right chest wall boost: 10 Gy in 5 fractions   08/04/2017 Relapse/Recurrence   08/04/2017, MRI of the liver and total spine MRI 08/11/2017 showed liver and bone metastasis; liver biopsy 09/07/2017 confirms estrogen receptor positive, progesterone receptor negative, HER-2 negative metastatic breast cancer; chest CT scan 08/13/2017 shows multiple bilateral pulmonary nodules.   08/17/2017 - 01/04/2018 Anti-estrogen oral therapy   fulvestrant  palbociclib started 08/31/2017 discontinued 01/04/2018 with evidence of progression in the liver   08/2017 Miscellaneous   [facility] report on liver biopsy from April 2019 shows a negative PDL 1, stable MSI and proficient mismatch repair status.  The tumor is positive for the androgen receptor, and for PTEN for PIK3 CA mutations.  HER-2/neu was read as equivocal (it was negative by FISH on the pathology report).   01/04/2018 Miscellaneous   anastrozole started 01/04/2018 everolimus started 01/29/2018   02/16/2018 - 03/01/2018 Radiation Therapy   01/28/18 prophylactic left femur intramedullary nail surgery for impending left femur pathologic fracture alliative radiation with Dr. Basilio Cairo to the left femur and left ilium from 02/16/2018 to 03/01/2018   01/30/2020 - 05/2020 Anti-estrogen oral therapy   alpelisib started 01/30/2020 at 300 mg daily with Metformin 500 mg daily and Faslodex (stopped for progression)   05/22/2020 - 08/2020 Chemotherapy   Xeloda discontinued for progression   09/10/2020 - 06/18/2021 Chemotherapy   Patient is on Treatment Plan : BREAST Paclitaxel D1,D15 q28d     07/17/2021 - 12/18/2021 Chemotherapy   Patient is on Treatment Plan : BREAST METASTATIC Sacituzumab govitecan-hziy Drinda Butts) q21d     01/01/2022 -  Chemotherapy   Patient is on Treatment Plan : BREAST METASTATIC Sacituzumab govitecan-hziy Drinda Butts) D1,8 q21d     Bilateral  malignant neoplasm of breast in female, estrogen receptor positive  12/26/2015 Initial Diagnosis    Bilateral malignant neoplasm of breast in female, estrogen receptor positive (HCC)   07/17/2021 - 12/18/2021 Chemotherapy   Patient is on Treatment Plan : BREAST METASTATIC Sacituzumab govitecan-hziy Drinda Butts) q21d     01/01/2022 -  Chemotherapy   Patient is on Treatment Plan : BREAST METASTATIC Sacituzumab govitecan-hziy Drinda Butts) D1,8 q21d     Malignant neoplasm metastatic to liver  08/13/2017 Initial Diagnosis   Liver metastases (HCC)   07/17/2021 - 12/18/2021 Chemotherapy   Patient is on Treatment Plan : BREAST METASTATIC Sacituzumab govitecan-hziy Drinda Butts) q21d     01/01/2022 -  Chemotherapy   Patient is on Treatment Plan : BREAST METASTATIC Sacituzumab govitecan-hziy Drinda Butts) D1,8 q21d     Malignant neoplasm metastatic to lung  09/10/2020 Initial Diagnosis   Lung metastases (HCC)   07/17/2021 - 12/18/2021 Chemotherapy   Patient is on Treatment Plan : BREAST METASTATIC Sacituzumab govitecan-hziy Drinda Butts) q21d     01/01/2022 -  Chemotherapy   Patient is on Treatment Plan : BREAST METASTATIC Sacituzumab govitecan-hziy Drinda Butts) D1,8 q21d       CHIEF COMPLIANT: Follow-up metastatic breast cancer: Severe right shoulder pain, jaundice  INTERVAL HISTORY: Debbie [name] is a 57 year old with above-mentioned history of metastatic breast cancer currently  Sacituzumab-Govitecan,  She presents to the clinic for an urgent visit.  She reports that she popped her right shoulder out of place. She does have numbness and tingling and deep pain. She could not drive or reach any knobs. Pain in the anterior right shoulder. Complains of stool been chalky.  ALLERGIES:  is allergic to ace inhibitors, atorvastatin, and simvastatin.  MEDICATIONS:  Current Outpatient Medications  Medication Sig Dispense Refill   acetaminophen (TYLENOL) 500 MG tablet Take 1,000 mg by mouth every 6 (six) hours as needed.     Calcium-Magnesium 250-125 MG TABS Take 1 tablet by mouth daily. 120 each 4   cetirizine  (ZYRTEC) 10 MG tablet Take 10 mg by mouth daily as needed for allergies.      chlorhexidine (PERIDEX) 0.12 % solution Use 5 mLs in the mouth or throat 2 (two) times daily as needed as directed. *Do not swallow* 473 mL 6   chlorhexidine (PERIDEX) 0.12 % solution Swish using for 2 full minutes then spit. do this twice daily 473 mL 0   cyanocobalamin (VITAMIN B12) 1000 MCG tablet Take 1,000 mcg by mouth daily.     diphenoxylate-atropine (LOMOTIL) 2.5-0.025 MG tablet Take 1 tablet by mouth 4 (four) times daily as needed for diarrhea or loose stools. 30 tablet 2   flurbiprofen (ANSAID) 100 MG tablet Take by mouth.     furosemide (LASIX) 20 MG tablet Take 1 tablet (20 mg total) by mouth daily. 90 tablet 6   glucose blood (FREESTYLE LITE) test strip Use 1 strip to check blood sugar 3 (three) times daily. 50 strip 3   levothyroxine (SYNTHROID) 175 MCG tablet Take 1 tablet (175 mcg total) by mouth in the morning on an empty stomach 90 tablet 1   losartan-hydrochlorothiazide (HYZAAR) 100-25 MG tablet Take 1 tablet by mouth daily. 30 tablet 11   losartan-hydrochlorothiazide (HYZAAR) 100-25 MG tablet Take 1 tablet by mouth daily. 30 tablet 11   metFORMIN (GLUCOPHAGE-XR) 500 MG 24 hr tablet Take 1 tablet (500 mg total) by mouth every evening with meal 90 tablet 1   Multiple Vitamin (MULTIVITAMIN) capsule as directed Orally     Multiple Vitamin (MULTIVITAMIN)  tablet Take 1 tablet by mouth daily.     omega-3 acid ethyl esters (LOVAZA) 1 g capsule Take 1 g by mouth 2 (two) times daily.      omeprazole (PRILOSEC) 40 MG capsule Take 1 capsule (40 mg total) by mouth daily. 90 capsule 3   ondansetron (ZOFRAN-ODT) 8 MG disintegrating tablet Dissolve 1 tablet (8 mg total) by mouth every 8 (eight) hours as needed for nausea or vomiting. 20 tablet 0   oxyCODONE (OXY IR/ROXICODONE) 5 MG immediate release tablet Take 1 tablet (5 mg total) by mouth every 4 (four) hours as needed for severe pain. 60 tablet 0    oxyCODONE-acetaminophen (PERCOCET/ROXICET) 5-325 MG tablet Take 1 tablet by mouth every 8 (eight) hours as needed for severe pain. 20 tablet 0   potassium chloride (KLOR-CON) 10 MEQ tablet Take 3 tablets (30 mEq total) by mouth daily. 180 tablet 3   pregabalin (LYRICA) 100 MG capsule Take 1 capsule by mouth 3 times daily. 90 capsule 3   pregabalin (LYRICA) 100 MG capsule Take 1 capsule (100 mg total) by mouth 3 (three) times daily. 90 capsule 3   rosuvastatin (CRESTOR) 5 MG tablet Take 1 tablet (5 mg total) by mouth daily. 90 tablet 1   sacituzumab govitecan-hziy (TRODELVY) 180 MG chemo injection as directed Intravenous     sertraline (ZOLOFT) 100 MG tablet Take 1 tablet (100 mg total) by mouth daily. 90 tablet 3   valACYclovir (VALTREX) 500 MG tablet 2 tablet Orally Once a day     Vitamin D, Cholecalciferol, 1000 units CAPS Take 1 tablet by mouth daily. (Patient taking differently: Take 1,000 Units by mouth daily.) 90 capsule 4   No current facility-administered medications for this visit.   Facility-Administered Medications Ordered in Other Visits  Medication Dose Route Frequency Provider Last Rate Last Admin   heparin lock flush 100 unit/mL  500 Units Intracatheter Once Serena Croissant, MD       sodium chloride flush (NS) 0.9 % injection 10 mL  10 mL Intracatheter Once Serena Croissant, MD        PHYSICAL EXAMINATION: ECOG PERFORMANCE STATUS: 1 - Symptomatic but completely ambulatory  Vitals:   08/26/22 0936  BP: 122/77  Pulse: (!) 108  Resp: 18  Temp: 98.2 F (36.8 C)  SpO2: 98%   Filed Weights   08/26/22 0936  Weight: 284 lb 8 oz (129 kg)      LABORATORY DATA:  I have reviewed the data as listed    Latest Ref Rng & Units 08/20/2022    8:32 AM 08/06/2022    8:47 AM 07/30/2022    7:44 AM  CMP  Glucose 70 - 99 mg/dL 409  811  914   BUN 6 - 20 mg/dL Creatinine 0.44 - 1.00 mg/dL 7.82  9.56  2.13   Sodium 135 - 145 mmol/L 141  139  142   Potassium 3.5 - 5.1 mmol/L  3.0  3.4  3.3   Chloride 98 - 111 mmol/L 100  100  103   CO2 22 - 32 mmol/L 32  32  31   Calcium 8.9 - 10.3 mg/dL 08.6  57.8  9.7   Total Protein 6.5 - 8.1 g/dL 7.3  7.0  6.4   Total Bilirubin 0.3 - 1.2 mg/dL 3.5  0.5  0.4   Alkaline Phos 38 - 126 U/L 453  238  252   AST 15 - 41 U/L 205  41  35   ALT 0 - 44 U/L 275  44  40     Lab Results  Component Value Date   WBC 4.3 08/20/2022   HGB 11.7 (L) 08/20/2022   HCT 36.2 08/20/2022   MCV 91.6 08/20/2022   PLT 259 08/20/2022   NEUTROABS 2.9 08/20/2022    ASSESSMENT & PLAN:  Malignant neoplasm of upper-inner quadrant of right breast in female, estrogen receptor positive (HCC) Metastatic Breast Cancer: ER Pos and Her 2 Neg (previously progressed on Ibrance, fulvestrant, everolimus, alpelisib, Xeloda) Prior Treatment: Taxol 09/10/2020-06/18/21 Chronic right arm lymphedema IHC: HER2 0 ----------------------------------------------------------------------------------------------------------------------- Current treatment: Palliative treatment with Sacituzumab-Govitecan, today is cycle 20 CT CAP 08/13/2022: Interval increase in size of bilateral pulmonary nodules from 9 mm to 11 mm, 5 mm to 7 mm, 6 mm to 7 mm, lingula: 8 mm, hilar node 8 mm to 12 mm, interval increase in size of right hilar lymph node, stable bone metastases, increase in size of periaortic retroperitoneal lymph nodes 5 mm to 14 mm   Guardant360 is pending  Severe pain in the right shoulder: I looked at the x-rays and I do not see any fractures.  There could be a muscular sprain.  I will send a prescription for Percocets and muscle relaxants.  If it does not get better in a week I will obtain a right shoulder MRI.  Jaundice: We had to hold chemotherapy because of elevated bilirubin and LFTs.  On 08/19/2022: She had a CT scan which did not show any liver metastases and therefore we felt that the elevation of liver enzymes was related to Sacituzumab.  We have held her treatment so far.   We will repeat her liver function test today and also obtain a liver ultrasound for further evaluation.  Patient is very emotional today about the fact that her liver enzymes have gone up significantly and worries if this is likely to cause her demise.  I discussed with her that it is likely to be treatment related and therefore stopping the treatment would potentially lead to significant improvements without doing anything further.    Orders Placed This Encounter  Procedures   US Abdomen Complete    Standing Status:   Future    Standing Expiration Date:   08/26/2023    Order Specific Question:   Reason for Exam (SYMPTOM  OR DIAGNOSIS REQUIRED)    Answer:   Elevated liver enzymes and bilirubin    Order Specific Question:   Preferred imaging location?    Answer:   [facility]    Order Specific Question:   Release to patient    Answer:   Immediate   CMP (Cancer Center only)    Standing Status:   Future    Number of Occurrences:   1    Standing Expiration Date:   08/26/2023   CBC with Differential (Cancer Center Only)    Standing Status:   Future    Number of Occurrences:   1    Standing Expiration Date:   08/26/2023   Bilirubin, direct    Standing Status:   Future    Number of Occurrences:   1    Standing Expiration Date:   08/26/2023   Gamma GT    Standing Status:   Future    Number of Occurrences:   1    Standing Expiration Date:   08/26/2023   Hepatitis panel, acute    Standing Status:   Future    Number of Occurrences:  1    Standing Expiration Date:   08/26/2023   The patient has a good understanding of the overall plan. she agrees with it. she will call with any problems that may develop before the next visit here. Total time spent: 30 mins including face to face time and time spent for planning, charting and co-ordination of care   Tamsen Meek, MD 08/26/22    I Janan Ridge am acting as a Neurosurgeon for [employer]  I have reviewed the above  documentation for accuracy and completeness, and I agree with the above.

## 2022-08-25 NOTE — Telephone Encounter (Signed)
Received call from pt with complaint of severe right shoulder pain x 3 days.  Pt states she is unable to lift right extremity past waste level.  Pt denies recent injury or trauma.  Per MD pt needing xray to r/o fracture and [facility] visit.  Orders placed, appt scheduled. Pt notified and verbalized understanding.

## 2022-08-26 ENCOUNTER — Other Ambulatory Visit: Payer: Self-pay | Admitting: Hematology and Oncology

## 2022-08-26 ENCOUNTER — Other Ambulatory Visit (HOSPITAL_COMMUNITY): Payer: Self-pay

## 2022-08-26 ENCOUNTER — Inpatient Hospital Stay: Payer: Commercial Managed Care - PPO

## 2022-08-26 ENCOUNTER — Ambulatory Visit (HOSPITAL_COMMUNITY)
Admission: RE | Admit: 2022-08-26 | Discharge: 2022-08-26 | Disposition: A | Discharge status: Home / Self Care | Payer: Commercial Managed Care - PPO | Source: Ambulatory Visit | Attending: Hematology and Oncology | Admitting: Hematology and Oncology

## 2022-08-26 ENCOUNTER — Inpatient Hospital Stay (HOSPITAL_BASED_OUTPATIENT_CLINIC_OR_DEPARTMENT_OTHER): Payer: Commercial Managed Care - PPO | Admitting: Hematology and Oncology

## 2022-08-26 VITALS — BP 122/77 | HR 108 | Temp 98.2°F | Resp 18 | Ht 66.0 in | Wt 284.5 lb

## 2022-08-26 DIAGNOSIS — M25511 Pain in right shoulder: Secondary | ICD-10-CM | POA: Insufficient documentation

## 2022-08-26 DIAGNOSIS — C50211 Malignant neoplasm of upper-inner quadrant of right female breast: Secondary | ICD-10-CM

## 2022-08-26 DIAGNOSIS — Z170 Estrogen receptor positive status [ER+]: Secondary | ICD-10-CM

## 2022-08-26 DIAGNOSIS — C787 Secondary malignant neoplasm of liver and intrahepatic bile duct: Secondary | ICD-10-CM

## 2022-08-26 DIAGNOSIS — Z79899 Other long term (current) drug therapy: Secondary | ICD-10-CM | POA: Diagnosis not present

## 2022-08-26 DIAGNOSIS — C7800 Secondary malignant neoplasm of unspecified lung: Secondary | ICD-10-CM | POA: Diagnosis not present

## 2022-08-26 DIAGNOSIS — Z9013 Acquired absence of bilateral breasts and nipples: Secondary | ICD-10-CM | POA: Diagnosis not present

## 2022-08-26 DIAGNOSIS — Z5112 Encounter for antineoplastic immunotherapy: Secondary | ICD-10-CM | POA: Diagnosis not present

## 2022-08-26 DIAGNOSIS — Z95828 Presence of other vascular implants and grafts: Secondary | ICD-10-CM

## 2022-08-26 DIAGNOSIS — C7951 Secondary malignant neoplasm of bone: Secondary | ICD-10-CM | POA: Diagnosis not present

## 2022-08-26 LAB — CBC WITH DIFFERENTIAL (CANCER CENTER ONLY)
Abs Immature Granulocytes: 0.05 10*3/uL (ref 0.00–0.07)
Basophils Absolute: 0 10*3/uL (ref 0.0–0.1)
Basophils Relative: 1 %
Eosinophils Absolute: 0.1 10*3/uL (ref 0.0–0.5)
Eosinophils Relative: 2 %
HCT: 34.3 % — ABNORMAL LOW (ref 36.0–46.0)
Hemoglobin: 11.6 g/dL — ABNORMAL LOW (ref 12.0–15.0)
Immature Granulocytes: 1 %
Lymphocytes Relative: 13 %
Lymphs Abs: 0.7 10*3/uL (ref 0.7–4.0)
MCH: 30.3 pg (ref 26.0–34.0)
MCHC: 33.8 g/dL (ref 30.0–36.0)
MCV: 89.6 fL (ref 80.0–100.0)
Monocytes Absolute: 0.6 10*3/uL (ref 0.1–1.0)
Monocytes Relative: 10 %
Neutro Abs: 4.4 10*3/uL (ref 1.7–7.7)
Neutrophils Relative %: 73 %
Platelet Count: 232 10*3/uL (ref 150–400)
RBC: 3.83 MIL/uL — ABNORMAL LOW (ref 3.87–5.11)
RDW: 21.1 % — ABNORMAL HIGH (ref 11.5–15.5)
WBC Count: 5.9 10*3/uL (ref 4.0–10.5)
nRBC: 0 % (ref 0.0–0.2)

## 2022-08-26 LAB — CMP (CANCER CENTER ONLY)
ALT: 178 U/L — ABNORMAL HIGH (ref 0–44)
AST: 164 U/L — ABNORMAL HIGH (ref 15–41)
Albumin: 3.6 g/dL (ref 3.5–5.0)
Alkaline Phosphatase: 589 U/L — ABNORMAL HIGH (ref 38–126)
Anion gap: 9 (ref 5–15)
BUN: 14 mg/dL (ref 6–20)
CO2: 30 mmol/L (ref 22–32)
Calcium: 10.6 mg/dL — ABNORMAL HIGH (ref 8.9–10.3)
Chloride: 100 mmol/L (ref 98–111)
Creatinine: 0.9 mg/dL (ref 0.44–1.00)
GFR, Estimated: >60 mL/min (ref >60)
Glucose, Bld: 104 mg/dL — ABNORMAL HIGH (ref 70–99)
Potassium: 2.9 mmol/L — ABNORMAL LOW (ref 3.5–5.1)
Sodium: 139 mmol/L (ref 135–145)
Total Bilirubin: 7.1 mg/dL (ref 0.3–1.2)
Total Protein: 7.6 g/dL (ref 6.5–8.1)

## 2022-08-26 LAB — HEPATITIS PANEL, ACUTE
HCV Ab: NONREACTIVE
Hep A IgM: NONREACTIVE
Hep B C IgM: NONREACTIVE
Hepatitis B Surface Ag: NONREACTIVE

## 2022-08-26 LAB — MAGNESIUM: Magnesium: 1.7 mg/dL (ref 1.7–2.4)

## 2022-08-26 LAB — BILIRUBIN, DIRECT: Bilirubin, Direct: 5.1 mg/dL — ABNORMAL HIGH (ref 0.0–0.2)

## 2022-08-26 LAB — GAMMA GT: GGT: 885 U/L — ABNORMAL HIGH (ref 7–50)

## 2022-08-26 LAB — PHOSPHORUS: Phosphorus: 3.5 mg/dL (ref 2.5–4.6)

## 2022-08-26 MED ORDER — OXYCODONE-ACETAMINOPHEN 5-325 MG PO TABS
1.0000 | ORAL_TABLET | Freq: Three times a day (TID) | ORAL | 0 refills | Status: DC | PRN
  Filled 2022-08-26: qty 20, 7d supply, fill #0

## 2022-08-26 MED ORDER — HEPARIN SOD (PORK) LOCK FLUSH 100 UNIT/ML IV SOLN
500.0000 [IU] | Freq: Once | INTRAVENOUS | Status: AC
  Administered 2022-08-26: 500 [IU]

## 2022-08-26 MED ORDER — CYCLOBENZAPRINE HCL 5 MG PO TABS
5.0000 mg | ORAL_TABLET | Freq: Three times a day (TID) | ORAL | 0 refills | Status: DC | PRN
  Filled 2022-08-26: qty 30, 10d supply, fill #0

## 2022-08-26 MED ORDER — SODIUM CHLORIDE 0.9% FLUSH
10.0000 mL | Freq: Once | INTRAVENOUS | Status: AC
  Administered 2022-08-26: 10 mL

## 2022-08-26 MED FILL — Fosaprepitant Dimeglumine For IV Infusion 150 MG (Base Eq): INTRAVENOUS | Qty: 5 | Status: AC

## 2022-08-26 MED FILL — Dexamethasone Sodium Phosphate Inj 100 MG/10ML: INTRAMUSCULAR | Qty: 1 | Status: AC

## 2022-08-26 NOTE — Progress Notes (Signed)
CRITICAL VALUE STICKER  CRITICAL VALUE: Total Bili 7.1  RECEIVER (on-site recipient of call): Grier Rocher, RN  DATE & TIME NOTIFIED: 08/26/22 1154  MD NOTIFIED: Serena Croissant  TIME OF NOTIFICATION: 1155  RESPONSE:  Awaiting remainder of CMP results per MD

## 2022-08-26 NOTE — Assessment & Plan Note (Signed)
Metastatic Breast Cancer: ER Pos and Her 2 Neg (previously progressed on Ibrance, fulvestrant, everolimus, alpelisib, Xeloda) Prior Treatment: Taxol 09/10/2020-06/18/21 Chronic right arm lymphedema IHC: HER2 0 ----------------------------------------------------------------------------------------------------------------------- Current treatment: Palliative treatment with Sacituzumab-Govitecan, today is cycle 20 CT CAP 08/13/2022: Interval increase in size of bilateral pulmonary nodules from 9 mm to 11 mm, 5 mm to 7 mm, 6 mm to 7 mm, lingula: 8 mm, hilar node 8 mm to 12 mm, interval increase in size of right hilar lymph node, stable bone metastases, increase in size of periaortic retroperitoneal lymph nodes 5 mm to 14 mm   Guardant360 is pending  Severe pain in the shoulder:

## 2022-08-27 ENCOUNTER — Inpatient Hospital Stay (HOSPITAL_BASED_OUTPATIENT_CLINIC_OR_DEPARTMENT_OTHER): Payer: Commercial Managed Care - PPO | Admitting: Hematology and Oncology

## 2022-08-27 ENCOUNTER — Inpatient Hospital Stay: Payer: Commercial Managed Care - PPO

## 2022-08-27 ENCOUNTER — Encounter: Payer: Self-pay | Admitting: *Deleted

## 2022-08-27 ENCOUNTER — Ambulatory Visit (HOSPITAL_COMMUNITY)
Admission: RE | Admit: 2022-08-27 | Discharge: 2022-08-27 | Disposition: A | Discharge status: Home / Self Care | Payer: Commercial Managed Care - PPO | Source: Ambulatory Visit | Attending: Hematology and Oncology | Admitting: Hematology and Oncology

## 2022-08-27 ENCOUNTER — Encounter: Payer: Self-pay | Admitting: Hematology and Oncology

## 2022-08-27 ENCOUNTER — Telehealth: Payer: Self-pay

## 2022-08-27 ENCOUNTER — Telehealth: Payer: Self-pay | Admitting: Hematology and Oncology

## 2022-08-27 ENCOUNTER — Other Ambulatory Visit: Payer: Self-pay | Admitting: *Deleted

## 2022-08-27 DIAGNOSIS — C7800 Secondary malignant neoplasm of unspecified lung: Secondary | ICD-10-CM | POA: Diagnosis not present

## 2022-08-27 DIAGNOSIS — Z170 Estrogen receptor positive status [ER+]: Secondary | ICD-10-CM | POA: Diagnosis not present

## 2022-08-27 DIAGNOSIS — C50211 Malignant neoplasm of upper-inner quadrant of right female breast: Secondary | ICD-10-CM

## 2022-08-27 DIAGNOSIS — C787 Secondary malignant neoplasm of liver and intrahepatic bile duct: Secondary | ICD-10-CM | POA: Diagnosis not present

## 2022-08-27 DIAGNOSIS — Z79899 Other long term (current) drug therapy: Secondary | ICD-10-CM | POA: Diagnosis not present

## 2022-08-27 DIAGNOSIS — K7689 Other specified diseases of liver: Secondary | ICD-10-CM | POA: Diagnosis not present

## 2022-08-27 DIAGNOSIS — Z95828 Presence of other vascular implants and grafts: Secondary | ICD-10-CM

## 2022-08-27 DIAGNOSIS — Z5112 Encounter for antineoplastic immunotherapy: Secondary | ICD-10-CM | POA: Diagnosis not present

## 2022-08-27 DIAGNOSIS — Z9013 Acquired absence of bilateral breasts and nipples: Secondary | ICD-10-CM | POA: Diagnosis not present

## 2022-08-27 DIAGNOSIS — C7951 Secondary malignant neoplasm of bone: Secondary | ICD-10-CM | POA: Diagnosis not present

## 2022-08-27 LAB — CMP (CANCER CENTER ONLY)
ALT: 172 U/L — ABNORMAL HIGH (ref 0–44)
AST: 178 U/L (ref 15–41)
Albumin: 3.4 g/dL — ABNORMAL LOW (ref 3.5–5.0)
Alkaline Phosphatase: 598 U/L — ABNORMAL HIGH (ref 38–126)
Anion gap: 9 (ref 5–15)
BUN: 17 mg/dL (ref 6–20)
CO2: 29 mmol/L (ref 22–32)
Calcium: 10.5 mg/dL — ABNORMAL HIGH (ref 8.9–10.3)
Chloride: 100 mmol/L (ref 98–111)
Creatinine: 1.14 mg/dL — ABNORMAL HIGH (ref 0.44–1.00)
GFR, Estimated: 56 mL/min — ABNORMAL LOW (ref >60)
Glucose, Bld: 105 mg/dL — ABNORMAL HIGH (ref 70–99)
Potassium: 2.9 mmol/L — ABNORMAL LOW (ref 3.5–5.1)
Sodium: 138 mmol/L (ref 135–145)
Total Bilirubin: 7.7 mg/dL (ref 0.3–1.2)
Total Protein: 7.4 g/dL (ref 6.5–8.1)

## 2022-08-27 LAB — CBC WITH DIFFERENTIAL (CANCER CENTER ONLY)
Abs Immature Granulocytes: 0.04 10*3/uL (ref 0.00–0.07)
Basophils Absolute: 0 10*3/uL (ref 0.0–0.1)
Basophils Relative: 1 %
Eosinophils Absolute: 0.2 10*3/uL (ref 0.0–0.5)
Eosinophils Relative: 3 %
HCT: 33.5 % — ABNORMAL LOW (ref 36.0–46.0)
Hemoglobin: 11.4 g/dL — ABNORMAL LOW (ref 12.0–15.0)
Immature Granulocytes: 1 %
Lymphocytes Relative: 11 %
Lymphs Abs: 0.7 10*3/uL (ref 0.7–4.0)
MCH: 30.3 pg (ref 26.0–34.0)
MCHC: 34 g/dL (ref 30.0–36.0)
MCV: 89.1 fL (ref 80.0–100.0)
Monocytes Absolute: 0.6 10*3/uL (ref 0.1–1.0)
Monocytes Relative: 9 %
Neutro Abs: 4.5 10*3/uL (ref 1.7–7.7)
Neutrophils Relative %: 75 %
Platelet Count: 247 10*3/uL (ref 150–400)
RBC: 3.76 MIL/uL — ABNORMAL LOW (ref 3.87–5.11)
RDW: 21.4 % — ABNORMAL HIGH (ref 11.5–15.5)
WBC Count: 5.9 10*3/uL (ref 4.0–10.5)
nRBC: 0 % (ref 0.0–0.2)

## 2022-08-27 LAB — BILIRUBIN, DIRECT: Bilirubin, Direct: 5.3 mg/dL — ABNORMAL HIGH (ref 0.0–0.2)

## 2022-08-27 LAB — CANCER ANTIGEN 27.29: CA 27.29: 267.2 U/mL — ABNORMAL HIGH (ref 0.0–38.6)

## 2022-08-27 LAB — GUARDANT 360

## 2022-08-27 MED ORDER — SODIUM CHLORIDE 0.9% FLUSH
10.0000 mL | Freq: Once | INTRAVENOUS | Status: AC
  Administered 2022-08-27: 10 mL

## 2022-08-27 MED ORDER — HEPARIN SOD (PORK) LOCK FLUSH 100 UNIT/ML IV SOLN
500.0000 [IU] | Freq: Once | INTRAVENOUS | Status: AC
  Administered 2022-08-27: 500 [IU]

## 2022-08-27 NOTE — Assessment & Plan Note (Addendum)
Metastatic Breast Cancer: ER Pos and Her 2 Neg (previously progressed on Ibrance, fulvestrant, everolimus, alpelisib, Xeloda) Prior Treatment: Taxol 09/10/2020-06/18/21 Chronic right arm lymphedema IHC: HER2 0 ----------------------------------------------------------------------------------------------------------------------- Current treatment: Palliative treatment with Sacituzumab-Govitecan, today is cycle 20 CT CAP 08/13/2022: Interval increase in size of bilateral pulmonary nodules from 9 mm to 11 mm, 5 mm to 7 mm, 6 mm to 7 mm, lingula: 8 mm, hilar node 8 mm to 12 mm, interval increase in size of right hilar lymph node, stable bone metastases, increase in size of periaortic retroperitoneal lymph nodes 5 mm to 14 mm   Guardant360: ESR 1 mutation and PIK 3 CA mutation  Severe pain in the shoulder: On muscle relaxants and pain medicine  I discussed with the patient that the AST and ALT are coming down although the bilirubin is higher.  We will have to recheck her labs again on Monday.  I would like her pathology to be sent for [facility] for HER2.  If she is HER2 low we can consider Enhertu.

## 2022-08-27 NOTE — Telephone Encounter (Signed)
CRITICAL VALUE STICKER  CRITICAL VALUE:   AST  178,   Total bilirubin  7.7  RECEIVER (on-site recipient of call):  Daneil Dolin, LPN  DATE & TIME NOTIFIED: 10:21    08/27/2022  MESSENGER (representative from lab):  Pam  MD NOTIFIED: Serena Croissant, MD  TIME OF NOTIFICATION:  10:25  RESPONSE:

## 2022-08-27 NOTE — Progress Notes (Signed)
Per MD request RN placed call to [facility] path and spoke with [facility] regarding 2019 path report.  MD requesting HER 2 by IHC be added.  Debbie [name] states the block is currently with Guardant 360 and they are waiting for it to be returned to [facility].  Once returned, Debbie [name] will review with pathologist to determine if HER 2 by [facility] can be added and will alert our office.  MD notified.

## 2022-08-27 NOTE — Telephone Encounter (Signed)
Scheduled appointment per 4/18 los. Left voicemail.

## 2022-08-27 NOTE — Progress Notes (Signed)
Patient Care Team: Debbie Croissant, Debbie [name] as PCP - General (Hematology and Oncology) Dorisann Frames, Debbie [name] as Consulting Physician (Endocrinology) Griselda Miner, Debbie [name] as Consulting Physician (General Surgery) Lonie Peak, Debbie [name] as Attending Physician (Radiation Oncology) Lyn Records, Debbie [name] (Inactive) as Consulting Physician (Cardiology) Axel Filler Larna Daughters, NP as Nurse Practitioner (Hematology and Oncology) Gertie Baron, DMD (Dentistry) Anselm Lis, RPH-CPP (Pharmacist) Debbie Croissant, Debbie [name] as Consulting Physician (Hematology and Oncology)  DIAGNOSIS:  Encounter Diagnosis  Name Primary?   Malignant neoplasm of upper-inner quadrant of right breast in female, estrogen receptor positive     SUMMARY OF ONCOLOGIC HISTORY: Oncology History  Malignant neoplasm of upper-inner quadrant of right breast in female, estrogen receptor positive  10/15/2015 Initial Biopsy   Right breast upper inner quadrant biopsy: IDC, DCIS, grade 2, ER+(90%), PR+(50%), Ki67 70%, HER-2 negative (ratio 1.18).   10/17/2015 Initial Diagnosis   Malignant neoplasm of upper-inner quadrant of right breast in female, estrogen receptor positive (HCC)   10/2015 -  Anti-estrogen oral therapy   Tamoxifen daily   11/28/2015 Surgery   Left breast mammoplasty: IDC, grade 1, 1.2cm, DCIS, lobular neoplasia, T1c, Nx, ER 80%, PR60%, Ki67 3% HER-2 neg Right breast lumpectomy: IDC, grade 1, 2.3cm, DCIS. T2N1a ER/PR+HER-2 neg, Ki 70%.   11/28/2015 Miscellaneous   Mammaprint was Low Risk. Predicting a 98% chance of no disease recurrence within 5 years with anti-estrogens as the only systemic therapy.  It also predicts minimal to no benefit from chemotherapy.     12/26/2015 Surgery   Bilateral mastectomies: bilateral sentinel node sampling, Right: no residual carcinoma, left, focal DCIS, 4 SLN negative   02/11/2016 - 03/27/2016 Radiation Therapy   Adjuvant radiation Basilio Cairo): 1) Right chest wall: 50.4 Gy in 28 fractions  2) Right  chest wall boost: 10 Gy in 5 fractions   08/04/2017 Relapse/Recurrence   08/04/2017, MRI of the liver and total spine MRI 08/11/2017 showed liver and bone metastasis; liver biopsy 09/07/2017 confirms estrogen receptor positive, progesterone receptor negative, HER-2 negative metastatic breast cancer; chest CT scan 08/13/2017 shows multiple bilateral pulmonary nodules.   08/17/2017 - 01/04/2018 Anti-estrogen oral therapy   fulvestrant  palbociclib started 08/31/2017 discontinued 01/04/2018 with evidence of progression in the liver   08/2017 Miscellaneous   [facility] report on liver biopsy from April 2019 shows a negative PDL 1, stable MSI and proficient mismatch repair status.  The tumor is positive for the androgen receptor, and for PTEN for PIK3 CA mutations.  HER-2/neu was read as equivocal (it was negative by FISH on the pathology report).   01/04/2018 Miscellaneous   anastrozole started 01/04/2018 everolimus started 01/29/2018   02/16/2018 - 03/01/2018 Radiation Therapy   01/28/18 prophylactic left femur intramedullary nail surgery for impending left femur pathologic fracture alliative radiation with Dr. Basilio Cairo to the left femur and left ilium from 02/16/2018 to 03/01/2018   01/30/2020 - 05/2020 Anti-estrogen oral therapy   alpelisib started 01/30/2020 at 300 mg daily with Metformin 500 mg daily and Faslodex (stopped for progression)   05/22/2020 - 08/2020 Chemotherapy   Xeloda discontinued for progression   09/10/2020 - 06/18/2021 Chemotherapy   Patient is on Treatment Plan : BREAST Paclitaxel D1,D15 q28d     07/17/2021 - 12/18/2021 Chemotherapy   Patient is on Treatment Plan : BREAST METASTATIC Sacituzumab govitecan-hziy Drinda Butts) q21d     01/01/2022 -  Chemotherapy   Patient is on Treatment Plan : BREAST METASTATIC Sacituzumab govitecan-hziy Drinda Butts) D1,8 q21d     Bilateral malignant neoplasm of breast  in female, estrogen receptor positive  12/26/2015 Initial Diagnosis   Bilateral malignant  neoplasm of breast in female, estrogen receptor positive (HCC)   07/17/2021 - 12/18/2021 Chemotherapy   Patient is on Treatment Plan : BREAST METASTATIC Sacituzumab govitecan-hziy Drinda Butts) q21d     01/01/2022 -  Chemotherapy   Patient is on Treatment Plan : BREAST METASTATIC Sacituzumab govitecan-hziy Drinda Butts) D1,8 q21d     Malignant neoplasm metastatic to liver  08/13/2017 Initial Diagnosis   Liver metastases (HCC)   07/17/2021 - 12/18/2021 Chemotherapy   Patient is on Treatment Plan : BREAST METASTATIC Sacituzumab govitecan-hziy Drinda Butts) q21d     01/01/2022 -  Chemotherapy   Patient is on Treatment Plan : BREAST METASTATIC Sacituzumab govitecan-hziy Drinda Butts) D1,8 q21d     Malignant neoplasm metastatic to lung  09/10/2020 Initial Diagnosis   Lung metastases (HCC)   07/17/2021 - 12/18/2021 Chemotherapy   Patient is on Treatment Plan : BREAST METASTATIC Sacituzumab govitecan-hziy Drinda Butts) q21d     01/01/2022 -  Chemotherapy   Patient is on Treatment Plan : BREAST METASTATIC Sacituzumab govitecan-hziy Drinda Butts) D1,8 q21d       CHIEF COMPLIANT: Follow-up to evaluate her jaundice  INTERVAL HISTORY: Debbie [name] is a 57-year-old with above-mentioned history of metastatic breast cancer we discontinued Drinda Butts because of elevated bilirubin and AST and ALT.  We are waiting on ultrasound of the liver to be done today to discuss if there is any blockage that would need stenting.  We received the results of Guardant360 and is here today to discuss those results.   ALLERGIES:  is allergic to ace inhibitors, atorvastatin, and simvastatin.  MEDICATIONS:  Current Outpatient Medications  Medication Sig Dispense Refill   acetaminophen (TYLENOL) 500 MG tablet Take 1,000 mg by mouth every 6 (six) hours as needed.     Calcium-Magnesium 250-125 MG TABS Take 1 tablet by mouth daily. 120 each 4   cetirizine (ZYRTEC) 10 MG tablet Take 10 mg by mouth daily as needed for allergies.       chlorhexidine (PERIDEX) 0.12 % solution Use 5 mLs in the mouth or throat 2 (two) times daily as needed as directed. *Do not swallow* 473 mL 6   chlorhexidine (PERIDEX) 0.12 % solution Swish using for 2 full minutes then spit. do this twice daily 473 mL 0   cyanocobalamin (VITAMIN B12) 1000 MCG tablet Take 1,000 mcg by mouth daily.     cyclobenzaprine (FLEXERIL) 5 MG tablet Take 1 tablet (5 mg total) by mouth 3 (three) times daily as needed for muscle spasms. 30 tablet 0   diphenoxylate-atropine (LOMOTIL) 2.5-0.025 MG tablet Take 1 tablet by mouth 4 (four) times daily as needed for diarrhea or loose stools. 30 tablet 2   flurbiprofen (ANSAID) 100 MG tablet Take by mouth.     furosemide (LASIX) 20 MG tablet Take 1 tablet (20 mg total) by mouth daily. 90 tablet 6   glucose blood (FREESTYLE LITE) test strip Use 1 strip to check blood sugar 3 (three) times daily. 50 strip 3   levothyroxine (SYNTHROID) 175 MCG tablet Take 1 tablet (175 mcg total) by mouth in the morning on an empty stomach 90 tablet 1   losartan-hydrochlorothiazide (HYZAAR) 100-25 MG tablet Take 1 tablet by mouth daily. 30 tablet 11   losartan-hydrochlorothiazide (HYZAAR) 100-25 MG tablet Take 1 tablet by mouth daily. 30 tablet 11   metFORMIN (GLUCOPHAGE-XR) 500 MG 24 hr tablet Take 1 tablet (500 mg total) by mouth every evening with meal 90 tablet  1   Multiple Vitamin (MULTIVITAMIN) capsule as directed Orally     Multiple Vitamin (MULTIVITAMIN) tablet Take 1 tablet by mouth daily.     omega-3 acid ethyl esters (LOVAZA) 1 g capsule Take 1 g by mouth 2 (two) times daily.      omeprazole (PRILOSEC) 40 MG capsule Take 1 capsule (40 mg total) by mouth daily. 90 capsule 3   ondansetron (ZOFRAN-ODT) 8 MG disintegrating tablet Dissolve 1 tablet (8 mg total) by mouth every 8 (eight) hours as needed for nausea or vomiting. 20 tablet 0   oxyCODONE (OXY IR/ROXICODONE) 5 MG immediate release tablet Take 1 tablet (5 mg total) by mouth every 4  (four) hours as needed for severe pain. 60 tablet 0   oxyCODONE-acetaminophen (PERCOCET/ROXICET) 5-325 MG tablet Take 1 tablet by mouth every 8 (eight) hours as needed for severe pain. 20 tablet 0   potassium chloride (KLOR-CON) 10 MEQ tablet Take 3 tablets (30 mEq total) by mouth daily. 180 tablet 3   pregabalin (LYRICA) 100 MG capsule Take 1 capsule by mouth 3 times daily. 90 capsule 3   pregabalin (LYRICA) 100 MG capsule Take 1 capsule (100 mg total) by mouth 3 (three) times daily. 90 capsule 3   rosuvastatin (CRESTOR) 5 MG tablet Take 1 tablet (5 mg total) by mouth daily. 90 tablet 1   sacituzumab govitecan-hziy (TRODELVY) 180 MG chemo injection as directed Intravenous     sertraline (ZOLOFT) 100 MG tablet Take 1 tablet (100 mg total) by mouth daily. 90 tablet 3   valACYclovir (VALTREX) 500 MG tablet 2 tablet Orally Once a day     Vitamin D, Cholecalciferol, 1000 units CAPS Take 1 tablet by mouth daily. (Patient taking differently: Take 1,000 Units by mouth daily.) 90 capsule 4   No current facility-administered medications for this visit.   Facility-Administered Medications Ordered in Other Visits  Medication Dose Route Frequency Provider Last Rate Last Admin   heparin lock flush 100 unit/mL  500 Units Intracatheter Once Debbie Croissant, Debbie [name]       sodium chloride flush (NS) 0.9 % injection 10 mL  10 mL Intracatheter Once Debbie Croissant, Debbie [name]        PHYSICAL EXAMINATION: ECOG PERFORMANCE STATUS: 2 - Symptomatic, <50% confined to bed  Vitals:   08/27/22 0826  BP: 133/63  Pulse: (!) 107  Resp: 18  Temp: 97.8 F (36.6 C)  SpO2: 98%   Filed Weights   08/27/22 0826  Weight: 282 lb 4.8 oz (128.1 kg)      LABORATORY DATA:  I have reviewed the data as listed    Latest Ref Rng & Units 08/26/2022   10:19 AM 08/20/2022    8:32 AM 08/06/2022    8:47 AM  CMP  Glucose 70 - 99 mg/dL 161  096  045   BUN 6 - 20 mg/dL 14  7  11    Creatinine 0.44 - 1.00 mg/dL 4.09  8.11  9.14   Sodium 135 -  145 mmol/L 139  141  139   Potassium 3.5 - 5.1 mmol/L 2.9  3.0  3.4   Chloride 98 - 111 mmol/L 100  100  100   CO2 22 - 32 mmol/L 30  32  32   Calcium 8.9 - 10.3 mg/dL 78.2  95.6  21.3   Total Protein 6.5 - 8.1 g/dL 7.6  7.3  7.0   Total Bilirubin 0.3 - 1.2 mg/dL 7.1  3.5  0.5   Alkaline Phos 38 - 126  U/L 589  453  238   AST 15 - 41 U/L 164  205  41   ALT 0 - 44 U/L 178  275  44     Lab Results  Component Value Date   WBC 5.9 08/26/2022   HGB 11.6 (L) 08/26/2022   HCT 34.3 (L) 08/26/2022   MCV 89.6 08/26/2022   PLT 232 08/26/2022   NEUTROABS 4.4 08/26/2022    ASSESSMENT & PLAN:  Malignant neoplasm of upper-inner quadrant of right breast in female, estrogen receptor positive (HCC) Metastatic Breast Cancer: ER Pos and Her 2 Neg (previously progressed on Ibrance, fulvestrant, everolimus, alpelisib, Xeloda) Prior Treatment: Taxol 09/10/2020-06/18/21 Chronic right arm lymphedema IHC: HER2 0 ----------------------------------------------------------------------------------------------------------------------- Current treatment: Palliative treatment with Sacituzumab-Govitecan, today is cycle 20 CT CAP 08/13/2022: Interval increase in size of bilateral pulmonary nodules from 9 mm to 11 mm, 5 mm to 7 mm, 6 mm to 7 mm, lingula: 8 mm, hilar node 8 mm to 12 mm, interval increase in size of right hilar lymph node, stable bone metastases, increase in size of periaortic retroperitoneal lymph nodes 5 mm to 14 mm   Guardant360: ESR 1 mutation and PIK 3 CA mutation  Severe pain in the shoulder: On muscle relaxants and pain medicine  I discussed with the patient that the AST and ALT are coming down although the bilirubin is higher.  We will have to recheck her labs again on Monday.  I would like her pathology to be sent for [facility] for HER2.  If she is HER2 low we can consider Enhertu.     No orders of the defined types were placed in this encounter.  The patient has a good understanding of the  overall plan. she agrees with it. she will call with any problems that may develop before the next visit here. Total time spent: 30 mins including face to face time and time spent for planning, charting and co-ordination of care   Tamsen Meek, Debbie [name] 08/27/22    I Janan Ridge am acting as a Neurosurgeon for [employer]  I have reviewed the above documentation for accuracy and completeness, and I agree with the above.

## 2022-08-28 ENCOUNTER — Other Ambulatory Visit: Payer: Self-pay | Admitting: Hematology and Oncology

## 2022-08-28 ENCOUNTER — Ambulatory Visit (HOSPITAL_COMMUNITY)
Admission: RE | Admit: 2022-08-28 | Discharge: 2022-08-28 | Disposition: A | Discharge status: Home / Self Care | Payer: Commercial Managed Care - PPO | Source: Ambulatory Visit | Attending: Hematology and Oncology | Admitting: Hematology and Oncology

## 2022-08-28 ENCOUNTER — Other Ambulatory Visit: Payer: Self-pay | Admitting: *Deleted

## 2022-08-28 DIAGNOSIS — C787 Secondary malignant neoplasm of liver and intrahepatic bile duct: Secondary | ICD-10-CM | POA: Diagnosis not present

## 2022-08-28 DIAGNOSIS — R17 Unspecified jaundice: Secondary | ICD-10-CM | POA: Insufficient documentation

## 2022-08-28 DIAGNOSIS — R935 Abnormal findings on diagnostic imaging of other abdominal regions, including retroperitoneum: Secondary | ICD-10-CM | POA: Diagnosis not present

## 2022-08-28 MED ORDER — GADOPICLENOL 0.5 MMOL/ML IV SOLN
10.0000 mL | Freq: Once | INTRAVENOUS | Status: AC | PRN
  Administered 2022-08-28: 10 mL via INTRAVENOUS

## 2022-08-28 NOTE — Progress Notes (Signed)
Per MD request RN scheduled STAT MRCP for further evaluation.  Appt scheduled, pt notified and verbalized understanding.

## 2022-08-28 NOTE — Progress Notes (Signed)
Patient Care Team: Serena Croissant, MD as PCP - General (Hematology and Oncology) Dorisann Frames, MD as Consulting Physician (Endocrinology) Griselda Miner, MD as Consulting Physician (General Surgery) Lonie Peak, MD as Attending Physician (Radiation Oncology) Lyn Records, MD (Inactive) as Consulting Physician (Cardiology) Axel Filler Larna Daughters, NP as Nurse Practitioner (Hematology and Oncology) Gertie Baron, DMD (Dentistry) Anselm Lis, RPH-CPP (Pharmacist) Serena Croissant, MD as Consulting Physician (Hematology and Oncology)  DIAGNOSIS: No diagnosis found.  SUMMARY OF ONCOLOGIC HISTORY: Oncology History  Malignant neoplasm of upper-inner quadrant of right breast in female, estrogen receptor positive  10/15/2015 Initial Biopsy   Right breast upper inner quadrant biopsy: IDC, DCIS, grade 2, ER+(90%), PR+(50%), Ki67 70%, HER-2 negative (ratio 1.18).   10/17/2015 Initial Diagnosis   Malignant neoplasm of upper-inner quadrant of right breast in female, estrogen receptor positive (HCC)   10/2015 -  Anti-estrogen oral therapy   Tamoxifen daily   11/28/2015 Surgery   Left breast mammoplasty: IDC, grade 1, 1.2cm, DCIS, lobular neoplasia, T1c, Nx, ER 80%, PR60%, Ki67 3% HER-2 neg Right breast lumpectomy: IDC, grade 1, 2.3cm, DCIS. T2N1a ER/PR+HER-2 neg, Ki 70%.   11/28/2015 Miscellaneous   Mammaprint was Low Risk. Predicting a 98% chance of no disease recurrence within 5 years with anti-estrogens as the only systemic therapy.  It also predicts minimal to no benefit from chemotherapy.     12/26/2015 Surgery   Bilateral mastectomies: bilateral sentinel node sampling, Right: no residual carcinoma, left, focal DCIS, 4 SLN negative   02/11/2016 - 03/27/2016 Radiation Therapy   Adjuvant radiation Basilio Cairo): 1) Right chest wall: 50.4 Gy in 28 fractions  2) Right chest wall boost: 10 Gy in 5 fractions   08/04/2017 Relapse/Recurrence   08/04/2017, MRI of the liver and total spine MRI  08/11/2017 showed liver and bone metastasis; liver biopsy 09/07/2017 confirms estrogen receptor positive, progesterone receptor negative, HER-2 negative metastatic breast cancer; chest CT scan 08/13/2017 shows multiple bilateral pulmonary nodules.   08/17/2017 - 01/04/2018 Anti-estrogen oral therapy   fulvestrant  palbociclib started 08/31/2017 discontinued 01/04/2018 with evidence of progression in the liver   08/2017 Miscellaneous   [facility] report on liver biopsy from April 2019 shows a negative PDL 1, stable MSI and proficient mismatch repair status.  The tumor is positive for the androgen receptor, and for PTEN for PIK3 CA mutations.  HER-2/neu was read as equivocal (it was negative by FISH on the pathology report).   01/04/2018 Miscellaneous   anastrozole started 01/04/2018 everolimus started 01/29/2018   02/16/2018 - 03/01/2018 Radiation Therapy   01/28/18 prophylactic left femur intramedullary nail surgery for impending left femur pathologic fracture alliative radiation with Dr. Basilio Cairo to the left femur and left ilium from 02/16/2018 to 03/01/2018   01/30/2020 - 05/2020 Anti-estrogen oral therapy   alpelisib started 01/30/2020 at 300 mg daily with Metformin 500 mg daily and Faslodex (stopped for progression)   05/22/2020 - 08/2020 Chemotherapy   Xeloda discontinued for progression   09/10/2020 - 06/18/2021 Chemotherapy   Patient is on Treatment Plan : BREAST Paclitaxel D1,D15 q28d     07/17/2021 - 12/18/2021 Chemotherapy   Patient is on Treatment Plan : BREAST METASTATIC Sacituzumab govitecan-hziy Debbie [name]) q21d     01/01/2022 -  Chemotherapy   Patient is on Treatment Plan : BREAST METASTATIC Sacituzumab govitecan-hziy Debbie [name]) D1,8 q21d     Bilateral malignant neoplasm of breast in female, estrogen receptor positive  12/26/2015 Initial Diagnosis   Bilateral malignant neoplasm of breast in female, estrogen receptor positive (  HCC)   07/17/2021 - 12/18/2021 Chemotherapy   Patient is on Treatment  Plan : BREAST METASTATIC Sacituzumab govitecan-hziy Debbie [name]) q21d     01/01/2022 -  Chemotherapy   Patient is on Treatment Plan : BREAST METASTATIC Sacituzumab govitecan-hziy Debbie [name]) D1,8 q21d     Malignant neoplasm metastatic to liver  08/13/2017 Initial Diagnosis   Liver metastases (HCC)   07/17/2021 - 12/18/2021 Chemotherapy   Patient is on Treatment Plan : BREAST METASTATIC Sacituzumab govitecan-hziy Debbie [name]) q21d     01/01/2022 -  Chemotherapy   Patient is on Treatment Plan : BREAST METASTATIC Sacituzumab govitecan-hziy Debbie [name]) D1,8 q21d     Malignant neoplasm metastatic to lung  09/10/2020 Initial Diagnosis   Lung metastases (HCC)   07/17/2021 - 12/18/2021 Chemotherapy   Patient is on Treatment Plan : BREAST METASTATIC Sacituzumab govitecan-hziy Debbie [name]) q21d     01/01/2022 -  Chemotherapy   Patient is on Treatment Plan : BREAST METASTATIC Sacituzumab govitecan-hziy Debbie [name]) D1,8 q21d       CHIEF COMPLIANT: Follow-up on AST and ALT to review labs  INTERVAL HISTORY: Debbie [name] is a 57-year-old with above-mentioned history of metastatic breast cancer we discontinued Debbie [name] because of elevated bilirubin and AST and ALT.    ALLERGIES:  is allergic to ace inhibitors, atorvastatin, and simvastatin.  MEDICATIONS:  Current Outpatient Medications  Medication Sig Dispense Refill   acetaminophen (TYLENOL) 500 MG tablet Take 1,000 mg by mouth every 6 (six) hours as needed.     Calcium-Magnesium 250-125 MG TABS Take 1 tablet by mouth daily. 120 each 4   cetirizine (ZYRTEC) 10 MG tablet Take 10 mg by mouth daily as needed for allergies.      chlorhexidine (PERIDEX) 0.12 % solution Use 5 mLs in the mouth or throat 2 (two) times daily as needed as directed. ****Do not swallow**** 473 mL 6   chlorhexidine (PERIDEX) 0.12 % solution Swish using for 2 full minutes then spit. do this twice daily 473 mL 0   cyanocobalamin (VITAMIN B12) 1000 MCG tablet Take 1,000 mcg by  mouth daily.     cyclobenzaprine (FLEXERIL) 5 MG tablet Take 1 tablet (5 mg total) by mouth 3 (three) times daily as needed for muscle spasms. 30 tablet 0   diphenoxylate-atropine (LOMOTIL) 2.5-0.025 MG tablet Take 1 tablet by mouth 4 (four) times daily as needed for diarrhea or loose stools. 30 tablet 2   flurbiprofen (ANSAID) 100 MG tablet Take by mouth.     furosemide (LASIX) 20 MG tablet Take 1 tablet (20 mg total) by mouth daily. 90 tablet 6   glucose blood (FREESTYLE LITE) test strip Use 1 strip to check blood sugar 3 (three) times daily. 50 strip 3   levothyroxine (SYNTHROID) 175 MCG tablet Take 1 tablet (175 mcg total) by mouth in the morning on an empty stomach 90 tablet 1   losartan-hydrochlorothiazide (HYZAAR) 100-25 MG tablet Take 1 tablet by mouth daily. 30 tablet 11   losartan-hydrochlorothiazide (HYZAAR) 100-25 MG tablet Take 1 tablet by mouth daily. 30 tablet 11   metFORMIN (GLUCOPHAGE-XR) 500 MG 24 hr tablet Take 1 tablet (500 mg total) by mouth every evening with meal 90 tablet 1   Multiple Vitamin (MULTIVITAMIN) capsule as directed Orally     Multiple Vitamin (MULTIVITAMIN) tablet Take 1 tablet by mouth daily.     omega-3 acid ethyl esters (LOVAZA) 1 g capsule Take 1 g by mouth 2 (two) times daily.      omeprazole (PRILOSEC) 40 MG capsule Take  1 capsule (40 mg total) by mouth daily. 90 capsule 3   ondansetron (ZOFRAN-ODT) 8 MG disintegrating tablet Dissolve 1 tablet (8 mg total) by mouth every 8 (eight) hours as needed for nausea or vomiting. 20 tablet 0   oxyCODONE (OXY IR/ROXICODONE) 5 MG immediate release tablet Take 1 tablet (5 mg total) by mouth every 4 (four) hours as needed for severe pain. 60 tablet 0   oxyCODONE-acetaminophen (PERCOCET/ROXICET) 5-325 MG tablet Take 1 tablet by mouth every 8 (eight) hours as needed for severe pain. 20 tablet 0   potassium chloride (KLOR-CON) 10 MEQ tablet Take 3 tablets (30 mEq total) by mouth daily. 180 tablet 3   pregabalin (LYRICA) 100  MG capsule Take 1 capsule by mouth 3 times daily. 90 capsule 3   pregabalin (LYRICA) 100 MG capsule Take 1 capsule (100 mg total) by mouth 3 (three) times daily. 90 capsule 3   rosuvastatin (CRESTOR) 5 MG tablet Take 1 tablet (5 mg total) by mouth daily. 90 tablet 1   sacituzumab govitecan-hziy (TRODELVY) 180 MG chemo injection as directed Intravenous     sertraline (ZOLOFT) 100 MG tablet Take 1 tablet (100 mg total) by mouth daily. 90 tablet 3   valACYclovir (VALTREX) 500 MG tablet 2 tablet Orally Once a day     Vitamin D, Cholecalciferol, 1000 units CAPS Take 1 tablet by mouth daily. (Patient taking differently: Take 1,000 Units by mouth daily.) 90 capsule 4   No current facility-administered medications for this visit.    PHYSICAL EXAMINATION: ECOG PERFORMANCE STATUS: {CHL ONC ECOG PS:[phone]}  There were no vitals filed for this visit. There were no vitals filed for this visit.  BREAST:*** No palpable masses or nodules in either right or left breasts. No palpable axillary supraclavicular or infraclavicular adenopathy no breast tenderness or nipple discharge. (exam performed in the presence of a chaperone)  LABORATORY DATA:  I have reviewed the data as listed    Latest Ref Rng & Units 08/27/2022    9:22 AM 08/26/2022   10:19 AM 08/20/2022    8:32 AM  CMP  Glucose 70 - 99 mg/dL 409  811  914   BUN 6 - 20 mg/dL Creatinine 0.44 - 1.00 mg/dL 7.82  9.56  2.13   Sodium 135 - 145 mmol/L 138  139  141   Potassium 3.5 - 5.1 mmol/L 2.9  2.9  3.0   Chloride 98 - 111 mmol/L 100  100  100   CO2 22 - 32 mmol/L 29  30  32   Calcium 8.9 - 10.3 mg/dL 08.6  57.8  46.9   Total Protein 6.5 - 8.1 g/dL 7.4  7.6  7.3   Total Bilirubin 0.3 - 1.2 mg/dL 7.7  7.1  3.5   Alkaline Phos 38 - 126 U/L 598  589  453   AST 15 - 41 U/L 178  164  205   ALT 0 - 44 U/L 172  178  275     Lab Results  Component Value Date   WBC 5.9 08/27/2022   HGB 11.4 (L) 08/27/2022   HCT 33.5 (L) 08/27/2022    MCV 89.1 08/27/2022   PLT 247 08/27/2022   NEUTROABS 4.5 08/27/2022    ASSESSMENT & PLAN:  No problem-specific Assessment & Plan notes found for this encounter.    No orders of the defined types were placed in this encounter.  The patient has a good understanding of the overall  plan. she agrees with it. she will call with any problems that may develop before the next visit here. Total time spent: 30 mins including face to face time and time spent for planning, charting and co-ordination of care   Sherlyn Lick, CMA 08/28/22    I Janan Ridge am acting as a Neurosurgeon for [employer]  ***

## 2022-08-29 ENCOUNTER — Telehealth: Payer: Self-pay | Admitting: Hematology and Oncology

## 2022-08-29 NOTE — Telephone Encounter (Signed)
I called Ms Debbie [name] this morning to discuss the results of CT imaging and the possible need for ERCP to relieve the obstruction I explained that there is a mass in pancreas which is likely obstructing the flow of bile, per my discussion with radiologist on call, this may be amenable to stenting via ERCP. I explained that she may have to come to the ED< admitted so we can consult GI team for further management. She also understands Dr Pamelia Hoit may change her medication since she appears to be progressing. She was hoping to wait till Monday and asked me if this is emergency. I explained that she should have the procedure sooner the better. She will see Dr Pamelia Hoit on Monday and will go to the hospital and be admitted on Monday she said.  Once again, I strongly encouraged her to call us if any new or worsening jaundice or come to the hospital. She expressed understanding.

## 2022-08-31 ENCOUNTER — Encounter: Payer: Self-pay | Admitting: *Deleted

## 2022-08-31 ENCOUNTER — Encounter: Payer: Self-pay | Admitting: Hematology and Oncology

## 2022-08-31 ENCOUNTER — Other Ambulatory Visit: Payer: Self-pay | Admitting: Hematology and Oncology

## 2022-08-31 ENCOUNTER — Encounter (HOSPITAL_COMMUNITY): Payer: Self-pay | Admitting: Internal Medicine

## 2022-08-31 ENCOUNTER — Other Ambulatory Visit: Payer: Self-pay

## 2022-08-31 ENCOUNTER — Encounter (HOSPITAL_COMMUNITY): Payer: Self-pay

## 2022-08-31 ENCOUNTER — Inpatient Hospital Stay: Payer: Commercial Managed Care - PPO

## 2022-08-31 ENCOUNTER — Inpatient Hospital Stay (HOSPITAL_COMMUNITY)
Admission: AD | Admit: 2022-08-31 | Discharge: 2022-09-07 | DRG: 374 | Disposition: A | Discharge status: Hospice | Payer: Commercial Managed Care - PPO | Source: Ambulatory Visit | Attending: Family Medicine | Admitting: Family Medicine

## 2022-08-31 ENCOUNTER — Inpatient Hospital Stay (HOSPITAL_BASED_OUTPATIENT_CLINIC_OR_DEPARTMENT_OTHER): Payer: Commercial Managed Care - PPO | Admitting: Hematology and Oncology

## 2022-08-31 VITALS — BP 110/67 | HR 100 | Temp 97.9°F | Resp 16 | Ht 66.0 in | Wt 279.2 lb

## 2022-08-31 DIAGNOSIS — Z8249 Family history of ischemic heart disease and other diseases of the circulatory system: Secondary | ICD-10-CM | POA: Diagnosis not present

## 2022-08-31 DIAGNOSIS — D649 Anemia, unspecified: Secondary | ICD-10-CM | POA: Diagnosis not present

## 2022-08-31 DIAGNOSIS — D630 Anemia in neoplastic disease: Secondary | ICD-10-CM | POA: Diagnosis not present

## 2022-08-31 DIAGNOSIS — Z800 Family history of malignant neoplasm of digestive organs: Secondary | ICD-10-CM

## 2022-08-31 DIAGNOSIS — C50919 Malignant neoplasm of unspecified site of unspecified female breast: Secondary | ICD-10-CM

## 2022-08-31 DIAGNOSIS — Z95828 Presence of other vascular implants and grafts: Secondary | ICD-10-CM

## 2022-08-31 DIAGNOSIS — K8689 Other specified diseases of pancreas: Secondary | ICD-10-CM | POA: Diagnosis not present

## 2022-08-31 DIAGNOSIS — C787 Secondary malignant neoplasm of liver and intrahepatic bile duct: Secondary | ICD-10-CM | POA: Diagnosis not present

## 2022-08-31 DIAGNOSIS — E7800 Pure hypercholesterolemia, unspecified: Secondary | ICD-10-CM | POA: Diagnosis present

## 2022-08-31 DIAGNOSIS — B3731 Acute candidiasis of vulva and vagina: Secondary | ICD-10-CM | POA: Insufficient documentation

## 2022-08-31 DIAGNOSIS — C7802 Secondary malignant neoplasm of left lung: Secondary | ICD-10-CM | POA: Diagnosis present

## 2022-08-31 DIAGNOSIS — E890 Postprocedural hypothyroidism: Secondary | ICD-10-CM | POA: Diagnosis present

## 2022-08-31 DIAGNOSIS — Z170 Estrogen receptor positive status [ER+]: Secondary | ICD-10-CM

## 2022-08-31 DIAGNOSIS — C50211 Malignant neoplasm of upper-inner quadrant of right female breast: Secondary | ICD-10-CM | POA: Diagnosis present

## 2022-08-31 DIAGNOSIS — Z66 Do not resuscitate: Secondary | ICD-10-CM | POA: Diagnosis present

## 2022-08-31 DIAGNOSIS — Z9221 Personal history of antineoplastic chemotherapy: Secondary | ICD-10-CM

## 2022-08-31 DIAGNOSIS — C7889 Secondary malignant neoplasm of other digestive organs: Secondary | ICD-10-CM | POA: Diagnosis not present

## 2022-08-31 DIAGNOSIS — K831 Obstruction of bile duct: Secondary | ICD-10-CM | POA: Diagnosis present

## 2022-08-31 DIAGNOSIS — Z6841 Body Mass Index (BMI) 40.0 and over, adult: Secondary | ICD-10-CM | POA: Diagnosis not present

## 2022-08-31 DIAGNOSIS — Z923 Personal history of irradiation: Secondary | ICD-10-CM

## 2022-08-31 DIAGNOSIS — C801 Malignant (primary) neoplasm, unspecified: Secondary | ICD-10-CM | POA: Diagnosis not present

## 2022-08-31 DIAGNOSIS — C7801 Secondary malignant neoplasm of right lung: Secondary | ICD-10-CM | POA: Diagnosis present

## 2022-08-31 DIAGNOSIS — N739 Female pelvic inflammatory disease, unspecified: Secondary | ICD-10-CM | POA: Insufficient documentation

## 2022-08-31 DIAGNOSIS — I10 Essential (primary) hypertension: Secondary | ICD-10-CM | POA: Diagnosis present

## 2022-08-31 DIAGNOSIS — Z7984 Long term (current) use of oral hypoglycemic drugs: Secondary | ICD-10-CM

## 2022-08-31 DIAGNOSIS — R590 Localized enlarged lymph nodes: Secondary | ICD-10-CM

## 2022-08-31 DIAGNOSIS — R17 Unspecified jaundice: Secondary | ICD-10-CM

## 2022-08-31 DIAGNOSIS — Z79899 Other long term (current) drug therapy: Secondary | ICD-10-CM

## 2022-08-31 DIAGNOSIS — Z833 Family history of diabetes mellitus: Secondary | ICD-10-CM

## 2022-08-31 DIAGNOSIS — C50911 Malignant neoplasm of unspecified site of right female breast: Secondary | ICD-10-CM

## 2022-08-31 DIAGNOSIS — C7951 Secondary malignant neoplasm of bone: Secondary | ICD-10-CM | POA: Diagnosis present

## 2022-08-31 DIAGNOSIS — D638 Anemia in other chronic diseases classified elsewhere: Secondary | ICD-10-CM | POA: Diagnosis not present

## 2022-08-31 DIAGNOSIS — Z8742 Personal history of other diseases of the female genital tract: Secondary | ICD-10-CM | POA: Insufficient documentation

## 2022-08-31 DIAGNOSIS — E039 Hypothyroidism, unspecified: Secondary | ICD-10-CM | POA: Diagnosis not present

## 2022-08-31 DIAGNOSIS — Z48815 Encounter for surgical aftercare following surgery on the digestive system: Secondary | ICD-10-CM | POA: Diagnosis not present

## 2022-08-31 DIAGNOSIS — Z825 Family history of asthma and other chronic lower respiratory diseases: Secondary | ICD-10-CM

## 2022-08-31 DIAGNOSIS — E876 Hypokalemia: Secondary | ICD-10-CM | POA: Diagnosis present

## 2022-08-31 DIAGNOSIS — Z8639 Personal history of other endocrine, nutritional and metabolic disease: Secondary | ICD-10-CM | POA: Insufficient documentation

## 2022-08-31 DIAGNOSIS — L299 Pruritus, unspecified: Secondary | ICD-10-CM | POA: Diagnosis present

## 2022-08-31 DIAGNOSIS — N920 Excessive and frequent menstruation with regular cycle: Secondary | ICD-10-CM | POA: Insufficient documentation

## 2022-08-31 DIAGNOSIS — K219 Gastro-esophageal reflux disease without esophagitis: Secondary | ICD-10-CM

## 2022-08-31 DIAGNOSIS — C250 Malignant neoplasm of head of pancreas: Secondary | ICD-10-CM | POA: Diagnosis not present

## 2022-08-31 DIAGNOSIS — N179 Acute kidney failure, unspecified: Secondary | ICD-10-CM | POA: Diagnosis not present

## 2022-08-31 DIAGNOSIS — Z79890 Hormone replacement therapy (postmenopausal): Secondary | ICD-10-CM | POA: Diagnosis not present

## 2022-08-31 DIAGNOSIS — C7800 Secondary malignant neoplasm of unspecified lung: Secondary | ICD-10-CM | POA: Diagnosis present

## 2022-08-31 DIAGNOSIS — Z4659 Encounter for fitting and adjustment of other gastrointestinal appliance and device: Secondary | ICD-10-CM | POA: Diagnosis not present

## 2022-08-31 DIAGNOSIS — Z9013 Acquired absence of bilateral breasts and nipples: Secondary | ICD-10-CM

## 2022-08-31 LAB — CBC WITH DIFFERENTIAL (CANCER CENTER ONLY)
Abs Immature Granulocytes: 0.06 10*3/uL (ref 0.00–0.07)
Basophils Absolute: 0 10*3/uL (ref 0.0–0.1)
Basophils Relative: 1 %
Eosinophils Absolute: 0.2 10*3/uL (ref 0.0–0.5)
Eosinophils Relative: 3 %
HCT: 32.2 % — ABNORMAL LOW (ref 36.0–46.0)
Hemoglobin: 11.1 g/dL — ABNORMAL LOW (ref 12.0–15.0)
Immature Granulocytes: 1 %
Lymphocytes Relative: 12 %
Lymphs Abs: 0.7 10*3/uL (ref 0.7–4.0)
MCH: 30.3 pg (ref 26.0–34.0)
MCHC: 34.5 g/dL (ref 30.0–36.0)
MCV: 88 fL (ref 80.0–100.0)
Monocytes Absolute: 0.4 10*3/uL (ref 0.1–1.0)
Monocytes Relative: 7 %
Neutro Abs: 4.5 10*3/uL (ref 1.7–7.7)
Neutrophils Relative %: 76 %
Platelet Count: 287 10*3/uL (ref 150–400)
RBC: 3.66 MIL/uL — ABNORMAL LOW (ref 3.87–5.11)
RDW: 22.1 % — ABNORMAL HIGH (ref 11.5–15.5)
WBC Count: 5.9 10*3/uL (ref 4.0–10.5)
nRBC: 0 % (ref 0.0–0.2)

## 2022-08-31 LAB — CMP (CANCER CENTER ONLY)
ALT: 171 U/L — ABNORMAL HIGH (ref 0–44)
AST: 204 U/L (ref 15–41)
Albumin: 3.3 g/dL — ABNORMAL LOW (ref 3.5–5.0)
Alkaline Phosphatase: 753 U/L — ABNORMAL HIGH (ref 38–126)
Anion gap: 12 (ref 5–15)
BUN: 26 mg/dL — ABNORMAL HIGH (ref 6–20)
CO2: 26 mmol/L (ref 22–32)
Calcium: 10.8 mg/dL — ABNORMAL HIGH (ref 8.9–10.3)
Chloride: 99 mmol/L (ref 98–111)
Creatinine: 2.19 mg/dL — ABNORMAL HIGH (ref 0.44–1.00)
GFR, Estimated: 25 mL/min — ABNORMAL LOW (ref >60)
Glucose, Bld: 120 mg/dL — ABNORMAL HIGH (ref 70–99)
Potassium: 2.9 mmol/L — ABNORMAL LOW (ref 3.5–5.1)
Sodium: 137 mmol/L (ref 135–145)
Total Bilirubin: 10.9 mg/dL (ref 0.3–1.2)
Total Protein: 7.3 g/dL (ref 6.5–8.1)

## 2022-08-31 LAB — MAGNESIUM: Magnesium: 1.7 mg/dL (ref 1.7–2.4)

## 2022-08-31 LAB — PHOSPHORUS: Phosphorus: 5.3 mg/dL — ABNORMAL HIGH (ref 2.5–4.6)

## 2022-08-31 MED ORDER — HYDROCHLOROTHIAZIDE 25 MG PO TABS: 25.0000 mg | ORAL_TABLET | Freq: Every day | ORAL | Status: DC

## 2022-08-31 MED ORDER — FUROSEMIDE 20 MG PO TABS: 20.0000 mg | ORAL_TABLET | Freq: Every day | ORAL | Status: DC

## 2022-08-31 MED ORDER — SERTRALINE HCL 100 MG PO TABS
100.0000 mg | ORAL_TABLET | Freq: Every day | ORAL | Status: DC
  Administered 2022-08-31 – 2022-09-07 (×7): 100 mg via ORAL
  Filled 2022-08-31 (×7): qty 1

## 2022-08-31 MED ORDER — OXYCODONE HCL 5 MG PO TABS
5.0000 mg | ORAL_TABLET | ORAL | Status: DC | PRN
  Administered 2022-08-31 – 2022-09-02 (×4): 5 mg via ORAL
  Filled 2022-08-31 (×4): qty 1

## 2022-08-31 MED ORDER — LORATADINE 10 MG PO TABS
10.0000 mg | ORAL_TABLET | Freq: Every day | ORAL | Status: DC
  Administered 2022-09-02 – 2022-09-07 (×6): 10 mg via ORAL
  Filled 2022-08-31 (×6): qty 1

## 2022-08-31 MED ORDER — DIPHENOXYLATE-ATROPINE 2.5-0.025 MG PO TABS: 1.0000 | ORAL_TABLET | ORAL | Status: DC | PRN

## 2022-08-31 MED ORDER — DIPHENHYDRAMINE HCL 25 MG PO CAPS
25.0000 mg | ORAL_CAPSULE | Freq: Once | ORAL | Status: AC | PRN
  Administered 2022-08-31: 25 mg via ORAL
  Filled 2022-08-31: qty 1

## 2022-08-31 MED ORDER — VITAMIN B-12 1000 MCG PO TABS
1000.0000 ug | ORAL_TABLET | Freq: Every day | ORAL | Status: DC
  Administered 2022-08-31 – 2022-09-07 (×6): 1000 ug via ORAL
  Filled 2022-08-31 (×7): qty 1

## 2022-08-31 MED ORDER — ONDANSETRON HCL 4 MG PO TABS: 4.0000 mg | ORAL_TABLET | Freq: Four times a day (QID) | ORAL | Status: DC | PRN

## 2022-08-31 MED ORDER — SODIUM CHLORIDE 0.9% FLUSH
10.0000 mL | Freq: Once | INTRAVENOUS | Status: AC
  Administered 2022-08-31: 10 mL

## 2022-08-31 MED ORDER — PREGABALIN 50 MG PO CAPS
100.0000 mg | ORAL_CAPSULE | Freq: Three times a day (TID) | ORAL | Status: DC
  Administered 2022-08-31 – 2022-09-07 (×20): 100 mg via ORAL
  Filled 2022-08-31 (×21): qty 2

## 2022-08-31 MED ORDER — LOSARTAN POTASSIUM 50 MG PO TABS: 100.0000 mg | ORAL_TABLET | Freq: Every day | ORAL | Status: DC

## 2022-08-31 MED ORDER — SODIUM CHLORIDE 0.9 % IV SOLN: INTRAVENOUS | Status: DC

## 2022-08-31 MED ORDER — PANTOPRAZOLE SODIUM 40 MG PO TBEC: 40.0000 mg | DELAYED_RELEASE_TABLET | Freq: Every day | ORAL | Status: DC

## 2022-08-31 MED ORDER — LOSARTAN POTASSIUM-HCTZ 100-25 MG PO TABS: 1.0000 | ORAL_TABLET | Freq: Every day | ORAL | Status: DC

## 2022-08-31 MED ORDER — PANTOPRAZOLE SODIUM 40 MG PO TBEC
40.0000 mg | DELAYED_RELEASE_TABLET | Freq: Every day | ORAL | Status: DC
  Administered 2022-09-02 – 2022-09-07 (×6): 40 mg via ORAL
  Filled 2022-08-31 (×6): qty 1

## 2022-08-31 MED ORDER — LEVOTHYROXINE SODIUM 50 MCG PO TABS
175.0000 ug | ORAL_TABLET | Freq: Every morning | ORAL | Status: DC
  Administered 2022-09-02 – 2022-09-07 (×5): 175 ug via ORAL
  Filled 2022-08-31 (×5): qty 1

## 2022-08-31 MED ORDER — IBUPROFEN 200 MG PO TABS
400.0000 mg | ORAL_TABLET | Freq: Once | ORAL | Status: AC
  Administered 2022-08-31: 400 mg via ORAL
  Filled 2022-08-31: qty 2

## 2022-08-31 MED ORDER — POTASSIUM CHLORIDE 10 MEQ/100ML IV SOLN
10.0000 meq | INTRAVENOUS | Status: AC
  Administered 2022-08-31 (×4): 10 meq via INTRAVENOUS
  Filled 2022-08-31: qty 100

## 2022-08-31 MED ORDER — POTASSIUM CHLORIDE IN NACL 40-0.9 MEQ/L-% IV SOLN: INTRAVENOUS | Status: DC

## 2022-08-31 MED ORDER — MAGNESIUM SULFATE 2 GM/50ML IV SOLN
2.0000 g | Freq: Once | INTRAVENOUS | Status: AC
  Administered 2022-08-31: 2 g via INTRAVENOUS
  Filled 2022-08-31: qty 50

## 2022-08-31 MED ORDER — CYCLOBENZAPRINE HCL 5 MG PO TABS: 5.0000 mg | ORAL_TABLET | Freq: Three times a day (TID) | ORAL | Status: DC | PRN

## 2022-08-31 MED ORDER — ONDANSETRON HCL 4 MG/2ML IJ SOLN: 4.0000 mg | Freq: Four times a day (QID) | INTRAMUSCULAR | Status: DC | PRN

## 2022-08-31 NOTE — H&P (Signed)
History and Physical    Patient: Debbie [name] HYQ:[id number] DOB: 06-02-1965 DOA: 08/31/2022 DOS: the patient was seen and examined on 08/31/2022 PCP: Serena Croissant, MD  Patient coming from: Home  Chief Complaint: Hyperbilirubinemia.  HPI: Debbie [name] is a 57 y.o. female with medical history significant of anxiety, osteoarthritis, chronic back pain, constipation, depression, headache, heart murmur, hypothyroidism, status post treatment with radioactive iodine, posttreatment hypothyroidism, hyperlipidemia, hypertension, menorrhagia, multiple pulmonary nodules, history of stress urinary incontinence, class III obesity, relapsing breast cancer with bone, hepatic and pulmonary metastasis treated with surgery, radiation and chemotherapy, postoperative right upper extremity lymphedema who was sent from the cancer center due to hyperbilirubinemia.  She has developed scleral icterus.  Her stools have been lighter and urine a lot darker since last week.   No abdominal pain, diarrhea, constipation, melena or hematochezia.  No flank pain, dysuria, frequency or hematuria.  No fever, chills or night sweats. No sore throat, rhinorrhea, dyspnea, wheezing or hemoptysis.  No chest pain, palpitations, diaphoresis, PND, orthopnea, but occasionally gets pitting edema of the lower extremities.  No polyuria, polydipsia, polyphagia or blurred vision.  MRCP done on Friday and revealed a 2.2 x 2.0 cm pancreatic mass with distended gallbladder with severe intra and extrahepatic biliary ductal dilatation.  The central CBD is sharply narrow within the central pancreatic head and reconstituted above the ampulla.  Please see recent labs and full imaging impression below.    Review of Systems: As mentioned in the history of present illness. All other systems reviewed and are negative. Past Medical History:  Diagnosis Date   Anxiety    Arthritis    Bone metastasis    Breast cancer of upper-inner quadrant of right  female breast [facility]) oncologist-  dr Darnelle Catalan--- 04/ 2019 ER/PR+ Stage IV w/ metastatic disease to liver, total spine, lung, bone   dx 06/ 2017  right breast invasive ductal carcinoma, Stage IIB, ER/PR + (pT2 pN1), grade 1-- s/p  right breast lumpecotmy w/ sln bx's and bilateral breast reduction 11-28-2015/  left breast with reduction , dx invasive ductal ca, Grade 1 (pT1cpNX) ER/PR positive/  adjuvant radiation completed 03-27-2016 to right chest wall, 2018  reclassifiec Stage IIA   Cancer, metastatic to liver (HCC)    Chronic back pain    Constipation    Depression    Family history of adverse reaction to anesthesia     mother has Copd was kept on vent    Headache    Heart murmur    History of hyperthyroidism    s/p  RAI   History of radiation therapy 02/11/16- 03/27/16   Right Chest Wall 50.4 Gy in 28 fractions, Right Chest Wall Boost 10 Gy in 5 fractions.    Hyperlipidemia    Hypertension    Hypothyroidism, postradioiodine therapy    endocrinoloigst-  dr balan--  2004  s/p  RAI   Menorrhagia 2011   Multiple pulmonary nodules    secondary  metastatic   PONV (postoperative nausea and vomiting)    patch helps    SUI (stress urinary incontinence, female)    Wears contact lenses    Wears dentures    full upper and lower partial   Past Surgical History:  Procedure Laterality Date   BREAST LUMPECTOMY WITH NEEDLE LOCALIZATION AND AXILLARY SENTINEL LYMPH NODE BX Right 11/28/2015   Procedure: RIGHT BREAST LUMPECTOMY WITH NEEDLE LOCALIZATION AND AXILLARY SENTINEL LYMPH NODE BX;  Surgeon: Chevis Pretty III, MD;  Location: [facility] OR;  Service: General;  Laterality: Right;   BREAST REDUCTION SURGERY Bilateral 11/28/2015   Procedure: MAMMARY REDUCTION  (BREAST) BILATERAL;  Surgeon: Etter Sjogren, MD;  Location: [facility] OR;  Service: Plastics;  Laterality: Bilateral;   CESAREAN SECTION  01/12/1993   COLONOSCOPY     Dental surgeries     FEMUR IM NAIL Left 01/28/2018   Procedure: INTRAMEDULLARY (IM) NAIL  FEMORAL;  Surgeon: Yolonda Kida, MD;  Location: [facility] OR;  Service: Orthopedics;  Laterality: Left;   HYSTEROSCOPY N/A 09/02/2017   Procedure: HYSTEROSCOPY  to remove IUD;  Surgeon: Hal Morales, MD;  Location: [facility];  Service: Gynecology;  Laterality: N/A;   IR IMAGING GUIDED PORT INSERTION  09/16/2020   Liver biospy     MASTECTOMY     MASTECTOMY W/ SENTINEL NODE BIOPSY Bilateral 12/26/2015   Procedure: BILATERAL MASTECTOMY WITH LEFT SENTINEL LYMPH NODE BIOPSY;  Surgeon: Chevis Pretty III, MD;  Location: [facility] OR;  Service: General;  Laterality: Bilateral;   Removal of IUD     TUBAL LIGATION Bilateral 1995   Social History:  reports that she has never smoked. She has never used smokeless tobacco. She reports that she does not drink alcohol and does not use drugs.  Allergies  Allergen Reactions   Ace Inhibitors Cough   Atorvastatin Other (See Comments)    Joint pain   Simvastatin Other (See Comments)    Joint pain    Family History  Problem Relation Age of Onset   Diabetes Mother    Hypertension Mother    Hypertension Father    Diabetes Maternal Grandmother    Hypertension Sister    Cancer Paternal Grandmother        colon   Colon cancer Paternal Grandmother    Esophageal cancer Neg Hx    Stomach cancer Neg Hx    Rectal cancer Neg Hx     Prior to Admission medications   Medication Sig Start Date End Date Taking? Authorizing Provider  acetaminophen (TYLENOL) 500 MG tablet Take 1,000 mg by mouth every 6 (six) hours as needed.    [provider]  Calcium-Magnesium 250-125 MG TABS Take 1 tablet by mouth daily. 04/09/16   [name], Valentino Hue, MD  cetirizine (ZYRTEC) 10 MG tablet Take 10 mg by mouth daily as needed for allergies.     [provider]  chlorhexidine (PERIDEX) 0.12 % solution Use 5 mLs in the mouth or throat 2 (two) times daily as needed as directed. Do not swallow 10/09/21   Serena Croissant, MD  cyanocobalamin (VITAMIN B12) 1000  MCG tablet Take 1,000 mcg by mouth daily.    [provider]  cyclobenzaprine (FLEXERIL) 5 MG tablet Take 1 tablet (5 mg total) by mouth 3 (three) times daily as needed for muscle spasms. 08/26/22   Serena Croissant, MD  diphenoxylate-atropine (LOMOTIL) 2.5-0.025 MG tablet Take 1 tablet by mouth 4 (four) times daily as needed for diarrhea or loose stools. 08/01/21   Serena Croissant, MD  flurbiprofen (ANSAID) 100 MG tablet Take by mouth. 03/04/22   [provider]  furosemide (LASIX) 20 MG tablet Take 1 tablet (20 mg total) by mouth daily. 12/11/21   Loa Socks, NP  glucose blood (FREESTYLE LITE) test strip Use 1 strip to check blood sugar 3 (three) times daily. 02/27/22     levothyroxine (SYNTHROID) 175 MCG tablet Take 1 tablet (175 mcg total) by mouth in the morning on an empty stomach 03/16/22     losartan-hydrochlorothiazide (HYZAAR) 100-25 MG tablet  Take 1 tablet by mouth daily. 03/19/22     losartan-hydrochlorothiazide (HYZAAR) 100-25 MG tablet Take 1 tablet by mouth daily. 03/19/22     metFORMIN (GLUCOPHAGE-XR) 500 MG 24 hr tablet Take 1 tablet (500 mg total) by mouth every evening with meal 06/25/22     Multiple Vitamin (MULTIVITAMIN) capsule as directed Orally    [provider]  omega-3 acid ethyl esters (LOVAZA) 1 g capsule Take 1 g by mouth 2 (two) times daily.     [provider]  omeprazole (PRILOSEC) 40 MG capsule Take 1 capsule (40 mg total) by mouth daily. 08/05/22   Serena Croissant, MD  ondansetron (ZOFRAN-ODT) 8 MG disintegrating tablet Dissolve 1 tablet (8 mg total) by mouth every 8 (eight) hours as needed for nausea or vomiting. 07/21/21   Serena Croissant, MD  oxyCODONE-acetaminophen (PERCOCET/ROXICET) 5-325 MG tablet Take 1 tablet by mouth every 8 (eight) hours as needed for severe pain. 08/26/22   Serena Croissant, MD  potassium chloride (KLOR-CON) 10 MEQ tablet Take 3 tablets (30 mEq total) by mouth daily. 07/09/22   Serena Croissant, MD  pregabalin  (LYRICA) 100 MG capsule Take 1 capsule by mouth 3 times daily. 08/12/22   Serena Croissant, MD  pregabalin (LYRICA) 100 MG capsule Take 1 capsule (100 mg total) by mouth 3 (three) times daily. 08/12/22   Serena Croissant, MD  rosuvastatin (CRESTOR) 5 MG tablet Take 1 tablet (5 mg total) by mouth daily. 03/12/22     sacituzumab govitecan-hziy (TRODELVY) 180 MG chemo injection as directed Intravenous    [provider]  sertraline (ZOLOFT) 100 MG tablet Take 1 tablet (100 mg total) by mouth daily. 04/23/22   Serena Croissant, MD  valACYclovir (VALTREX) 500 MG tablet 2 tablet Orally Once a day    [provider]  Vitamin D, Cholecalciferol, 1000 units CAPS Take 1 tablet by mouth daily. Patient taking differently: Take 1,000 Units by mouth daily. 04/09/16   [name], Valentino Hue, MD  prochlorperazine (COMPAZINE) 10 MG tablet Take 1 tablet (10 mg total) by mouth every 6 (six) hours as needed (Nausea or vomiting). 08/26/20 07/01/21  [name], Valentino Hue, MD    Vitals:   08/31/22 0900  BP: (!) 115/58  Pulse: (!) 101  Resp: 17  Temp: 98.2 F (36.8 C)  TempSrc: Oral  SpO2: 97%   Physical Exam Vitals and nursing note reviewed.  Constitutional:      General: She is awake. She is not in acute distress.    Appearance: She is obese. She is ill-appearing.  HENT:     Head: Normocephalic.     Nose: No rhinorrhea.     Mouth/Throat:     Mouth: Mucous membranes are moist.  Eyes:     General: Scleral icterus present.     Pupils: Pupils are equal, round, and reactive to light.  Neck:     Vascular: No JVD.  Cardiovascular:     Rate and Rhythm: Normal rate and regular rhythm.     Heart sounds: S1 normal and S2 normal.  Pulmonary:     Effort: Pulmonary effort is normal.     Breath sounds: Normal breath sounds. No wheezing, rhonchi or rales.  Abdominal:     General: Bowel sounds are normal.     Palpations: Abdomen is soft.  Musculoskeletal:     Cervical back: Neck supple.     Right lower leg: No  edema.     Left lower leg: No edema.  Skin:    General:  Skin is warm and dry.  Neurological:     General: No focal deficit present.     Mental Status: She is alert and oriented to person, place, and time.  Psychiatric:        Mood and Affect: Mood normal.        Behavior: Behavior normal. Behavior is cooperative.   Data Reviewed:  CBC: Recent Labs  Lab 08/26/22 1019 08/27/22 0922 08/31/22 0739  WBC 5.9 5.9 5.9  NEUTROABS 4.4 4.5 4.5  HGB 11.6* 11.4* 11.1*  HCT 34.3* 33.5* 32.2*  MCV 89.6 89.1 88.0  PLT 232 247 287    Basic Metabolic Panel: Recent Labs  Lab 08/26/22 1019 08/26/22 1032 08/27/22 0922 08/31/22 0739  NA 139  --  138 137  K 2.9*  --  2.9* 2.9*  CL 100  --  100 99  CO2 30  --  29 26  GLUCOSE 104*  --  105* 120*  BUN 14  --  17 26*  CREATININE 0.90  --  1.14* 2.19*  CALCIUM 10.6*  --  10.5* 10.8*  MG  --  1.7  --  1.7  PHOS  --  3.5  --  5.3*   Recent LabsGFR: Estimated Creatinine Clearance: 38.1 mL/min (A) (by C-G formula based on SCr of 2.19 mg/dL (H)).   Lab 08/26/22 1019 08/27/22 0922 08/31/22 0739  AST 164* 178* 204*  ALT 178* 172* 171*  ALKPHOS 589* 598* 753*  BILITOT 7.1* 7.7* 10.9*  PROT 7.6 7.4 7.3  ALBUMIN 3.6 3.4* 3.3*   08/27/2022 abdomen US  IMPRESSION: 1. Multiple hypoechoic masses in the liver. These masses are nonspecific today. Some appear to represent cysts. However, given the history of liver metastases, the findings are concerning for underlying metastases. MRI of the abdomen with and without contrast could better evaluate. 2. The gallbladder is distended. The common bile duct is dilated measuring 10 mm. Recommend MRCP or ERCP for better evaluation. 3. The IVC, pancreas, and aorta are not well visualized on today's study.  08/28/2022 MRCP   IMPRESSION: 1. Innumerable heterogeneously enhancing liver metastases of varying sizes throughout the liver, much more clearly appreciated by MR than on recent CT and greatly  increased in size and number in comparison to relatively remote MR dated 04/01/2021. 2. Distended gallbladder with severe intra and extrahepatic biliary ductal dilatation, the central common bile duct sharply narrowed within the central pancreatic head and reconstituted above the ampulla. Obstructing mass within the central pancreatic head underlying this obstruction measuring 2.2 x 2.0 cm. In the presence of known widespread metastatic disease would generally presume that this reflects an intrapancreatic metastasis, although metachronous pancreatic adenocarcinoma is in general a differential consideration. 3. Numerous enlarged retroperitoneal lymph nodes, partially imaged and similar to prior CT examination. 4. Numerous metastatic pulmonary nodules in the included lung bases, poorly imaged by MR. 5. Widespread osseous metastatic disease.   Assessment and Plan: Principal Problem:   Hyperbilirubinemia In the setting of:   Pancreatic mass MedSurg/inpatient. Keep n.p.o. for now. Time-limited IV fluids. Monitor hepatic function test. Check PT/INR with morning labs. Hold statin treatment for hyperlipidemia. GI has been consulted. -Will follow their recommendations.  Active Problems:   Hyperphosphatemia IV fluids. Repeat phosphorus level in AM.    Hypokalemia Currently NPO.   IV KCl replacement ordered. Magnesium level low normal. Magnesium sulfate 2 g IVPB x 1.    Hypertension Continue losartan 100 mg p.o. daily.   Continue furosemide 20 mg p.o. daily. Will need  a regular potassium replacement. Monitor BP, renal function and electrolytes.    Hypothyroid Continue levothyroxine 175 mcg p.o. daily.    Bilateral malignant neoplasm of  breast in female, estrogen receptor positive   Malignant neoplasm metastatic to bone   Malignant neoplasm metastatic to liver   Malignant neoplasm metastatic to lung The patient's wants to be DNR status. Follow-up with Dr. Chilton Si at the  cancer center.    Normocytic anemia Monitor hematocrit and hemoglobin. Transfuse as needed. Follow-up with Dr. Pamelia Hoit    Morbid obesity with BMI of 40.0-44.9, adult Current BMI 45.0 kg/m. Follow-up with PCP as an outpatient.    Hypercholesterolemia Hold rosuvastatin in the setting of hyperbilirubinemia.    Advance Care Planning:   Code Status: DNR   Consults: [facility] Tressia Danas, MD).  Family Communication: Her husband was at bedside.  Severity of Illness: The appropriate patient status for this patient is INPATIENT. Inpatient status is judged to be reasonable and necessary in order to provide the required intensity of service to ensure the patient's safety. The patient's presenting symptoms, physical exam findings, and initial radiographic and laboratory data in the context of their chronic comorbidities is felt to place them at high risk for further clinical deterioration. Furthermore, it is not anticipated that the patient will be medically stable for discharge from the hospital within 2 midnights of admission.   * I certify that at the point of admission it is my clinical judgment that the patient will require inpatient hospital care spanning beyond 2 midnights from the point of admission due to high intensity of service, high risk for further deterioration and high frequency of surveillance required.*  Author: Bobette Mo, MD 08/31/2022 9:13 AM  For on call review [email or web address].   This document was prepared using Dragon voice recognition software and may contain some unintended transcription errors.

## 2022-08-31 NOTE — Progress Notes (Signed)
[facility] provided spiritual and emotional support for patient and son who was bedside through active/reflective listening, building rapport and prayer upon request. Spiritual care visit was appreciated.   Casilda Carls, M. Div.  Healthcare Chaplain

## 2022-08-31 NOTE — Consult Note (Addendum)
Referring Provider: Serena Croissant, MD Primary Care Physician:  Serena Croissant, MD Primary Gastroenterologist:  Dr. Erick Blinks  Reason for Consultation: Hyperbilirubinemia, pancreatic mass  HPI: Debbie [name] is a 57 y.o. female with a past medical history of anxiety, depression, hypertension, hyperlipidemia, hyperthyroidism s/p radiation therapy and metastatic breast cancer initially diagnosed 10/2015 treated with chemotherapy and radiation, subsequently developed recurrence with liver, pulmonary and bone metastasis.  She was seen by her oncologist Dr. Ave Filter this morning. Her LFTs were recently elevated, initially thought due to [place] (initially started 07/2021) which was discontinued 3 weeks ago.  Repeat labs today showed a total bilirubin level of 10.9, alk phos 753.  AST 204 and ALT 171.  An abdominal MRI/MRCP w/wo contrast 08/28/2022 identified innumerable enhancing liver metastasis a distended gallbladder, intra and extrahepatic biliary dilatation, narrowed CBD duct with an obstructing mass to the head of the pancreas measuring 2.2 cm with numerous enlarged retroperitoneal lymph nodes, numerous metastatic pulmonary nodules and widespread bone metastasis. Dr. Pamelia Hoit recommended hospital admission for GI and IR consult for possible biliary stent placement.  She developed right shoulder pain a few weeks ago for which she took Advil 200 mg 2-3 tabs at bedtime.  No chest or abdominal pain.  Not on blood thinners. No nausea or vomiting.  She developed generalized itchiness 2 days ago.  Urine color notably darker for the past 4 days.  She also noticed her stool color was chalky to white for the past 3 to 4 days. Today, she passed a beige-colored stool.  No rectal bleeding or black stools.  She has heartburn on and off for which she takes Tums and sometimes drinks a small amount of pickle juice for symptom relief.  She denies ever having an EGD.  She underwent a colonoscopy in 2016 which  diverticulosis, no polyps.  Paternal grandmother had colon cancer.  No chest pain or shortness of breath.  Labs today: Sodium 137.  Potassium 2.9.  Glucose 120.  BUN 26.  Creatinine 2.19.  Calcium 10.8.  Albumin 3.3.  Total bili 10.9.  Alk phos 753.  AST 204.  ALT 171.  WBC 5.9.  Hemoglobin 11.1.  Hematocrit 32.2.  MCV 88.0.  Platelet 287.      Latest Ref Rng & Units 08/31/2022    7:39 AM 08/27/2022    9:22 AM 08/26/2022   10:19 AM  Hepatic Function  Total Protein 6.5 - 8.1 g/dL 7.3  7.4  7.6   Albumin 3.5 - 5.0 g/dL 3.3  3.4  3.6   AST 15 - 41 U/L 204  178  164   ALT 0 - 44 U/L 171  172  178   Alk Phosphatase 38 - 126 U/L 753  598  589   Total Bilirubin 0.3 - 1.2 mg/dL 69.6  7.7  7.1   Bilirubin, Direct 0.0 - 0.2 mg/dL  5.3  5.1     Labs 2/95/2841: Hepatitis B surface antigen nonreactive.  Hepatitis B IgM nonreactive.  Hepatitis A IgM nonreactive.  Hepatitis C antibody nonreactive.  GGT 885.  Total bili 7.1.  Direct bili 5.1.  Alk phos 589.  AST 164.  ALT 178.  GI PROCEDURES:  Colonoscopy 05/23/2014 by Dr. Rhea Belton: 1. Mild diverticulosis was noted in the sigmoid colon  2. The examination was otherwise normal 3. Recall colonoscopy 10 years  Past Medical History:  Diagnosis Date   Anxiety    Arthritis    Bone metastasis    Breast cancer of upper-inner quadrant  of right female breast oncologist-  dr Darnelle Catalan--- 04/ 2019 ER/PR+ Stage IV w/ metastatic disease to liver, total spine, lung, bone   dx 06/ 2017  right breast invasive ductal carcinoma, Stage IIB, ER/PR + (pT2 pN1), grade 1-- s/p  right breast lumpecotmy w/ sln bx's and bilateral breast reduction 11-28-2015/  left breast with reduction , dx invasive ductal ca, Grade 1 (pT1cpNX) ER/PR positive/  adjuvant radiation completed 03-27-2016 to right chest wall, 2018  reclassifiec Stage IIA   Cancer, metastatic to liver    Chronic back pain    Constipation    Depression    Family history of adverse reaction to anesthesia     mother  has Copd was kept on vent    Headache    Heart murmur    History of hyperthyroidism    s/p  RAI   History of radiation therapy 02/11/16- 03/27/16   Right Chest Wall 50.4 Gy in 28 fractions, Right Chest Wall Boost 10 Gy in 5 fractions.    Hyperlipidemia    Hypertension    Hypothyroidism, postradioiodine therapy    endocrinoloigst-  dr balan--  2004  s/p  RAI   Menorrhagia 2011   Multiple pulmonary nodules    secondary  metastatic   PONV (postoperative nausea and vomiting)    patch helps    SUI (stress urinary incontinence, female)    Wears contact lenses    Wears dentures    full upper and lower partial    Past Surgical History:  Procedure Laterality Date   BREAST LUMPECTOMY WITH NEEDLE LOCALIZATION AND AXILLARY SENTINEL LYMPH NODE BX Right 11/28/2015   Procedure: RIGHT BREAST LUMPECTOMY WITH NEEDLE LOCALIZATION AND AXILLARY SENTINEL LYMPH NODE BX;  Surgeon: Chevis Pretty III, MD;  Location: [facility] OR;  Service: General;  Laterality: Right;   BREAST REDUCTION SURGERY Bilateral 11/28/2015   Procedure: MAMMARY REDUCTION  (BREAST) BILATERAL;  Surgeon: Etter Sjogren, MD;  Location: [facility] OR;  Service: Plastics;  Laterality: Bilateral;   CESAREAN SECTION  01/12/1993   COLONOSCOPY     Dental surgeries     FEMUR IM NAIL Left 01/28/2018   Procedure: INTRAMEDULLARY (IM) NAIL FEMORAL;  Surgeon: Yolonda Kida, MD;  Location: [facility] OR;  Service: Orthopedics;  Laterality: Left;   HYSTEROSCOPY N/A 09/02/2017   Procedure: HYSTEROSCOPY  to remove IUD;  Surgeon: Hal Morales, MD;  Location: [facility];  Service: Gynecology;  Laterality: N/A;   IR IMAGING GUIDED PORT INSERTION  09/16/2020   Liver biospy     MASTECTOMY     MASTECTOMY W/ SENTINEL NODE BIOPSY Bilateral 12/26/2015   Procedure: BILATERAL MASTECTOMY WITH LEFT SENTINEL LYMPH NODE BIOPSY;  Surgeon: Chevis Pretty III, MD;  Location: [facility] OR;  Service: General;  Laterality: Bilateral;   Removal of IUD     TUBAL LIGATION Bilateral 1995     Prior to Admission medications   Medication Sig Start Date End Date Taking? Authorizing Provider  acetaminophen (TYLENOL) 500 MG tablet Take 1,000 mg by mouth every 6 (six) hours as needed.   Yes [provider]  cetirizine (ZYRTEC) 10 MG tablet Take 10 mg by mouth daily.   Yes [provider]  cyanocobalamin (VITAMIN B12) 1000 MCG tablet Take 1,000 mcg by mouth daily.   Yes [provider]  cyclobenzaprine (FLEXERIL) 5 MG tablet Take 1 tablet (5 mg total) by mouth 3 (three) times daily as needed for muscle spasms. Patient taking differently: Take 5 mg by mouth as needed for  muscle spasms. 08/26/22  Yes Serena Croissant, MD  diphenoxylate-atropine (LOMOTIL) 2.5-0.025 MG tablet Take 1 tablet by mouth 4 (four) times daily as needed for diarrhea or loose stools. Patient taking differently: Take 1 tablet by mouth as needed for diarrhea or loose stools. 08/01/21  Yes Serena Croissant, MD  flurbiprofen (ANSAID) 100 MG tablet Take 100 mg by mouth as needed (pain). 03/04/22  Yes [provider]  furosemide (LASIX) 20 MG tablet Take 1 tablet (20 mg total) by mouth daily. 12/11/21  Yes Causey, Larna Daughters, NP  ibuprofen (ADVIL) 200 MG tablet Take 200 mg by mouth as needed for moderate pain.   Yes [provider]  ibuprofen (ADVIL) 800 MG tablet Take 800 mg by mouth as needed for moderate pain.   Yes [provider]  levothyroxine (SYNTHROID) 175 MCG tablet Take 1 tablet (175 mcg total) by mouth in the morning on an empty stomach 03/16/22  Yes   losartan-hydrochlorothiazide (HYZAAR) 100-25 MG tablet Take 1 tablet by mouth daily. 03/19/22  Yes   Multiple Vitamin (MULTIVITAMIN) capsule Take 1 capsule by mouth daily.   Yes [provider]  omega-3 acid ethyl esters (LOVAZA) 1 g capsule Take 1 g by mouth daily.   Yes [provider]  omeprazole (PRILOSEC) 40 MG capsule Take 1 capsule (40 mg total) by mouth daily. 08/05/22  Yes Serena Croissant, MD   ondansetron (ZOFRAN-ODT) 8 MG disintegrating tablet Dissolve 1 tablet (8 mg total) by mouth every 8 (eight) hours as needed for nausea or vomiting. Patient taking differently: Take 8 mg by mouth as needed for nausea or vomiting. 07/21/21  Yes Serena Croissant, MD  oxyCODONE-acetaminophen (PERCOCET/ROXICET) 5-325 MG tablet Take 1 tablet by mouth every 8 (eight) hours as needed for severe pain. Patient taking differently: Take 1 tablet by mouth as needed for severe pain. 08/26/22  Yes Serena Croissant, MD  potassium chloride (KLOR-CON) 10 MEQ tablet Take 3 tablets (30 mEq total) by mouth daily. 07/09/22  Yes Serena Croissant, MD  pregabalin (LYRICA) 100 MG capsule Take 1 capsule by mouth 3 times daily. 08/12/22  Yes Serena Croissant, MD  rosuvastatin (CRESTOR) 5 MG tablet Take 1 tablet (5 mg total) by mouth daily. 03/12/22  Yes   sertraline (ZOLOFT) 100 MG tablet Take 1 tablet (100 mg total) by mouth daily. 04/23/22  Yes Serena Croissant, MD  Vitamin D, Cholecalciferol, 1000 units CAPS Take 1 tablet by mouth daily. Patient taking differently: Take 1,000 Units by mouth daily. 04/09/16  Yes [name], Valentino Hue, MD  Calcium-Magnesium 250-125 MG TABS Take 1 tablet by mouth daily. Patient not taking: Reported on 08/31/2022 04/09/16   [name], Valentino Hue, MD  chlorhexidine (PERIDEX) 0.12 % solution Use 5 mLs in the mouth or throat 2 (two) times daily as needed as directed. Do not swallow Patient not taking: Reported on 08/31/2022 10/09/21   Serena Croissant, MD  glucose blood (FREESTYLE LITE) test strip Use 1 strip to check blood sugar 3 (three) times daily. 02/27/22     losartan-hydrochlorothiazide (HYZAAR) 100-25 MG tablet Take 1 tablet by mouth daily. 03/19/22     metFORMIN (GLUCOPHAGE-XR) 500 MG 24 hr tablet Take 1 tablet (500 mg total) by mouth every evening with meal Patient not taking: Reported on 08/31/2022 06/25/22     pregabalin (LYRICA) 100 MG capsule Take 1 capsule (100 mg total) by mouth 3 (three) times daily. 08/12/22    Serena Croissant, MD  sacituzumab govitecan-hziy (TRODELVY) 180 MG chemo injection as directed Intravenous Patient not  taking: Reported on 08/31/2022    [provider]  prochlorperazine (COMPAZINE) 10 MG tablet Take 1 tablet (10 mg total) by mouth every 6 (six) hours as needed (Nausea or vomiting). 08/26/20 07/01/21  [name], Valentino Hue, MD    Current Facility-Administered Medications  Medication Dose Route Frequency Provider Last Rate Last Admin   cyanocobalamin (VITAMIN B12) tablet 1,000 mcg  1,000 mcg Oral Daily Bobette Mo, MD       cyclobenzaprine (FLEXERIL) tablet 5 mg  5 mg Oral TID PRN Bobette Mo, MD       diphenoxylate-atropine (LOMOTIL) 2.5-0.025 MG per tablet 1 tablet  1 tablet Oral PRN Bobette Mo, MD       furosemide (LASIX) tablet 20 mg  20 mg Oral Daily Bobette Mo, MD       losartan (COZAAR) tablet 100 mg  100 mg Oral Daily Bobette Mo, MD       And   hydrochlorothiazide (HYDRODIURIL) tablet 25 mg  25 mg Oral Daily Bobette Mo, MD       levothyroxine (SYNTHROID) tablet 175 mcg  175 mcg Oral q AM Bobette Mo, MD       loratadine (CLARITIN) tablet 10 mg  10 mg Oral Daily Bobette Mo, MD       magnesium sulfate IVPB 2 g 50 mL  2 g Intravenous Once Bobette Mo, MD       ondansetron [facility]) tablet 4 mg  4 mg Oral Q6H PRN Bobette Mo, MD       Or   ondansetron [facility] injection 4 mg  4 mg Intravenous Q6H PRN Bobette Mo, MD       oxyCODONE (Oxy IR/ROXICODONE) immediate release tablet 5 mg  5 mg Oral Q4H PRN Bobette Mo, MD       pantoprazole (PROTONIX) EC tablet 40 mg  40 mg Oral Daily Bobette Mo, MD       potassium chloride 10 mEq in 100 mL IVPB  10 mEq Intravenous Q1 Hr x 4 Bobette Mo, MD       pregabalin (LYRICA) capsule 100 mg  100 mg Oral TID Bobette Mo, MD       sertraline (ZOLOFT) tablet 100 mg  100 mg Oral Daily Bobette Mo, MD         Allergies as of 08/31/2022 - Review Complete 08/31/2022  Allergen Reaction Noted   Ace inhibitors Cough 11/27/2015   Atorvastatin Other (See Comments) 11/27/2015   Simvastatin Other (See Comments) 11/27/2015    Family History  Problem Relation Age of Onset   Diabetes Mother    Hypertension Mother    COPD Mother    Hypertension Father    Hypertension Sister    Diabetes Maternal Grandmother    Cancer Paternal Grandmother        colon   Colon cancer Paternal Grandmother    Esophageal cancer Neg Hx    Stomach cancer Neg Hx    Rectal cancer Neg Hx     Social History   Socioeconomic History   Marital status: Married    Spouse name: Not on file   Number of children: Not on file   Years of education: Not on file   Highest education level: Not on file  Occupational History   Not on file  Tobacco Use   Smoking status: Never   Smokeless tobacco: Never  Vaping Use   Vaping Use: Never used  Substance  and Sexual Activity   Alcohol use: No   Drug use: No   Sexual activity: Not on file    Comment: BTL   Other Topics Concern   Not on file  Social History Narrative   Not on file   Social Determinants of Health   Financial Resource Strain: Not on file  Food Insecurity: Not on file  Transportation Needs: No Transportation Needs (04/01/2018)   PRAPARE - Administrator, Civil Service (Medical): No    Lack of Transportation (Non-Medical): No  Physical Activity: Not on file  Stress: Not on file  Social Connections: Not on file  Intimate Partner Violence: Not At Risk (01/18/2018)   Humiliation, Afraid, Rape, and Kick questionnaire    Fear of Current or Ex-Partner: No    Emotionally Abused: No    Physically Abused: No    Sexually Abused: No    Review of Systems: Gen: Denies fever, sweats or chills. No weight loss.  CV: Denies chest pain, palpitations or edema. Resp: Denies cough, shortness of breath of hemoptysis.  GI: See HPI. GU : Denies urinary  burning, blood in urine, increased urinary frequency or incontinence. MS: Denies joint pain, muscles aches or weakness. Derm: + Generalized itchiness.  Psych: + Anxiety and depression. Heme: Denies easy bruising, bleeding. Neuro:  Denies headaches, dizziness or paresthesias. Endo:  + Thyroid disease.   Physical Exam: Vital signs in last 24 hours: Temp:  [97.9 F (36.6 C)-98.7 F (37.1 C)] 98.7 F (37.1 C) (04/22 1005) Pulse Rate:  [100-105] 105 (04/22 1005) Resp:  [16-19] 19 (04/22 1005) BP: (110-128)/(58-93) 128/93 (04/22 1005) SpO2:  [97 %-100 %] 100 % (04/22 1005) Weight:  [126.6 kg] 126.6 kg (04/22 0806)   General: Very pleasant 57 year old obese female in no acute distress. Head:  Normocephalic and atraumatic. Eyes: Scleral icterus present.  Conjunctiva pink. Ears:  Normal auditory acuity. Nose:  No deformity, discharge or lesions. Mouth: Upper dentures, lower partial weight.  No ulcers or lesions.  Neck:  Supple. No lymphadenopathy or thyromegaly.  Lungs: Breath sounds clear throughout. No wheezes, rhonchi or crackles.  Heart: Regular rate and rhythm, no murmurs. Abdomen: Soft, nontender.  Nondistended.  Positive bowel sounds to all 4 quadrants.  No palpable mass. Rectal: Deferred. Musculoskeletal:  Symmetrical without gross deformities.  Pulses:  Normal pulses noted. Extremities:  Without clubbing or edema. Neurologic:  Alert and  oriented x 4. No focal deficits.  Skin:  Intact without significant lesions or rashes. Psych:  Alert and cooperative. Normal mood and affect.  Intake/Output from previous day: No intake/output data recorded. Intake/Output this shift: No intake/output data recorded.  Lab Results: Recent Labs    08/31/22 0739  WBC 5.9  HGB 11.1*  HCT 32.2*  PLT 287   BMET Recent Labs    08/31/22 0739  NA 137  K 2.9*  CL 99  CO2 26  GLUCOSE 120*  BUN 26*  CREATININE 2.19*  CALCIUM 10.8*   LFT Recent Labs    08/31/22 0739  PROT 7.3   ALBUMIN 3.3*  AST 204*  ALT 171*  ALKPHOS 753*  BILITOT 10.9*    IMPRESSION/PLAN:  57 year old female with metastatic breast cancer directly admitted to the hospital secondary to hyperbilirubinemia, elevated LFTs concerning for biliary obstruction. Abdominal MRI/MRCP 08/28/2022 identified innumerable enhancing liver metastasis a distended gallbladder, intra and extrahepatic biliary dilatation, central CBD sharply narrowed within the central pancreatic head and reconstituted above the ampulla with an obstructing mass to the head of  the pancreas measuring 2.2 cm. Numerous enlarged retroperitoneal lymph nodes, numerous metastatic pulmonary nodules and widespread bone metastasis.  Recently developed right shoulder pain. No chest or abdominal pain. No N/V. Afebrile.  Hemodynamically stable. -Clear liquid diet -Defer recommendations regarding ERCP with biliary stent placement per Dr. Faustino Congress -Ondansetron 4 mg p.o. or IV every 6 hours as needed -Pain management per the hospitalist -CBC, BMP, hepatic panel and PT/INR in am  GERD -Pantoprazole 40 mg p.o. daily  Arnaldo Natal  08/31/2022, 11:27AM    [facility] Attending   I have taken an interval history, reviewed the chart and examined the patient. I agree with the Advanced Practitioner's note, impression and recommendations.    She has obstructive jaundice from metastatic breast cancer. ERCP and stent placement is appropriate.   The risks and benefits as well as alternatives of endoscopic procedure(s) have been discussed and reviewed. All questions answered. The patient agrees to proceed.  Iva Boop, MD, [facility] See Loretha Stapler on call - gastroenterology for best contact person 08/31/2022 3:36 PM

## 2022-08-31 NOTE — Progress Notes (Signed)
Per MD request, pt to be directly admitted for ERCP.  Bed control stated pt will be going to 6 E at [facility] room 1616.  RN called and report given to Allie,RN.  Pt taken to admitting to check in and to be transferred to floor.

## 2022-08-31 NOTE — Progress Notes (Signed)
CRITICAL VALUE STICKER  CRITICAL VALUE: T Bili 10.9 and AST 204  RECEIVER (on-site recipient of call): Adelina Mings, RN  DATE & TIME NOTIFIED: 08/31/22 at 0859  MD NOTIFIED: Serena Croissant, MD  TIME OF NOTIFICATION:08/31/22 at 0900  RESPONSE: MD notified and verbalized understanding.  Pt currently admitted.

## 2022-08-31 NOTE — Assessment & Plan Note (Signed)
Metastatic Breast Cancer: ER Pos and Her 2 Neg (previously progressed on Ibrance, fulvestrant, everolimus, alpelisib, Xeloda) Prior Treatment:  Taxol 09/10/2020-06/18/21 Palliative treatment with Sacituzumab-Govitecan Chronic right arm lymphedema IHC: HER2 0 ----------------------------------------------------------------------------------------------------------------------- MRI abdomen 08/28/2022: Innumerable enhancing liver metastases varying sizes throughout the liver, distended gallbladder with intra and extrahepatic biliary dilatation, obstructing mass within the central pancreatic head 2.2 cm, numerous enlarged retroperitoneal lymph nodes, numerous metastatic pulmonary nodules, widespread bone metastases  Recommended admission to the hospital with gastroenterology consultation for consideration for stent placement.

## 2022-09-01 ENCOUNTER — Inpatient Hospital Stay (HOSPITAL_COMMUNITY): Payer: Commercial Managed Care - PPO

## 2022-09-01 ENCOUNTER — Inpatient Hospital Stay (HOSPITAL_COMMUNITY): Payer: Commercial Managed Care - PPO | Admitting: Registered Nurse

## 2022-09-01 ENCOUNTER — Encounter (HOSPITAL_COMMUNITY)
Admission: AD | Disposition: A | Discharge status: Hospice | Payer: Self-pay | Source: Ambulatory Visit | Attending: Family Medicine

## 2022-09-01 ENCOUNTER — Encounter (HOSPITAL_COMMUNITY): Payer: Self-pay | Admitting: Internal Medicine

## 2022-09-01 DIAGNOSIS — K831 Obstruction of bile duct: Secondary | ICD-10-CM

## 2022-09-01 DIAGNOSIS — E039 Hypothyroidism, unspecified: Secondary | ICD-10-CM

## 2022-09-01 DIAGNOSIS — I10 Essential (primary) hypertension: Secondary | ICD-10-CM

## 2022-09-01 DIAGNOSIS — D638 Anemia in other chronic diseases classified elsewhere: Secondary | ICD-10-CM

## 2022-09-01 DIAGNOSIS — C250 Malignant neoplasm of head of pancreas: Secondary | ICD-10-CM | POA: Diagnosis not present

## 2022-09-01 HISTORY — PX: BILIARY STENT PLACEMENT: SHX5538

## 2022-09-01 HISTORY — PX: SPHINCTEROTOMY: SHX5544

## 2022-09-01 HISTORY — PX: ERCP: SHX5425

## 2022-09-01 HISTORY — PX: PANCREATIC STENT PLACEMENT: SHX5539

## 2022-09-01 LAB — CBC WITH DIFFERENTIAL/PLATELET
Abs Immature Granulocytes: 0.08 10*3/uL — ABNORMAL HIGH (ref 0.00–0.07)
Basophils Absolute: 0 10*3/uL (ref 0.0–0.1)
Basophils Relative: 1 %
Eosinophils Absolute: 0.2 10*3/uL (ref 0.0–0.5)
Eosinophils Relative: 4 %
HCT: 31 % — ABNORMAL LOW (ref 36.0–46.0)
Hemoglobin: 9.9 g/dL — ABNORMAL LOW (ref 12.0–15.0)
Immature Granulocytes: 1 %
Lymphocytes Relative: 8 %
Lymphs Abs: 0.5 10*3/uL — ABNORMAL LOW (ref 0.7–4.0)
MCH: 29.6 pg (ref 26.0–34.0)
MCHC: 31.9 g/dL (ref 30.0–36.0)
MCV: 92.8 fL (ref 80.0–100.0)
Monocytes Absolute: 0.6 10*3/uL (ref 0.1–1.0)
Monocytes Relative: 10 %
Neutro Abs: 5.1 10*3/uL (ref 1.7–7.7)
Neutrophils Relative %: 76 %
Platelets: 261 10*3/uL (ref 150–400)
RBC: 3.34 MIL/uL — ABNORMAL LOW (ref 3.87–5.11)
RDW: 22.8 % — ABNORMAL HIGH (ref 11.5–15.5)
WBC: 6.6 10*3/uL (ref 4.0–10.5)
nRBC: 0 % (ref 0.0–0.2)

## 2022-09-01 LAB — GLUCOSE, CAPILLARY: Glucose-Capillary: 98 mg/dL (ref 70–99)

## 2022-09-01 LAB — BASIC METABOLIC PANEL
Anion gap: 15 (ref 5–15)
BUN: 37 mg/dL — ABNORMAL HIGH (ref 6–20)
CO2: 24 mmol/L (ref 22–32)
Calcium: 10.3 mg/dL (ref 8.9–10.3)
Chloride: 96 mmol/L — ABNORMAL LOW (ref 98–111)
Creatinine, Ser: 2.83 mg/dL — ABNORMAL HIGH (ref 0.44–1.00)
GFR, Estimated: 19 mL/min — ABNORMAL LOW (ref >60)
Glucose, Bld: 130 mg/dL — ABNORMAL HIGH (ref 70–99)
Potassium: 3.3 mmol/L — ABNORMAL LOW (ref 3.5–5.1)
Sodium: 135 mmol/L (ref 135–145)

## 2022-09-01 LAB — HEPATIC FUNCTION PANEL
ALT: 157 U/L — ABNORMAL HIGH (ref 0–44)
AST: 198 U/L — ABNORMAL HIGH (ref 15–41)
Albumin: 2.4 g/dL — ABNORMAL LOW (ref 3.5–5.0)
Alkaline Phosphatase: 684 U/L — ABNORMAL HIGH (ref 38–126)
Bilirubin, Direct: 7.2 mg/dL — ABNORMAL HIGH (ref 0.0–0.2)
Indirect Bilirubin: 4.1 mg/dL — ABNORMAL HIGH (ref 0.3–0.9)
Total Bilirubin: 11.3 mg/dL — ABNORMAL HIGH (ref 0.3–1.2)
Total Protein: 6.9 g/dL (ref 6.5–8.1)

## 2022-09-01 LAB — MAGNESIUM: Magnesium: 2.7 mg/dL — ABNORMAL HIGH (ref 1.7–2.4)

## 2022-09-01 LAB — PROTIME-INR
INR: 1.1 (ref 0.8–1.2)
Prothrombin Time: 14.1 seconds (ref 11.4–15.2)

## 2022-09-01 LAB — PHOSPHORUS: Phosphorus: 6.9 mg/dL — ABNORMAL HIGH (ref 2.5–4.6)

## 2022-09-01 LAB — HIV ANTIBODY (ROUTINE TESTING W REFLEX): HIV Screen 4th Generation wRfx: NONREACTIVE

## 2022-09-01 SURGERY — ERCP, WITH INTERVENTION IF INDICATED
Anesthesia: General

## 2022-09-01 MED ORDER — POTASSIUM CHLORIDE CRYS ER 20 MEQ PO TBCR
40.0000 meq | EXTENDED_RELEASE_TABLET | Freq: Once | ORAL | Status: AC
  Administered 2022-09-01: 40 meq via ORAL
  Filled 2022-09-01 (×2): qty 2

## 2022-09-01 MED ORDER — DICLOFENAC SUPPOSITORY 100 MG
RECTAL | Status: AC
  Filled 2022-09-01: qty 1

## 2022-09-01 MED ORDER — EPHEDRINE SULFATE-NACL 50-0.9 MG/10ML-% IV SOSY
PREFILLED_SYRINGE | INTRAVENOUS | Status: DC | PRN
  Administered 2022-09-01: 5 mg via INTRAVENOUS

## 2022-09-01 MED ORDER — CIPROFLOXACIN IN D5W 400 MG/200ML IV SOLN
INTRAVENOUS | Status: AC
  Filled 2022-09-01: qty 200

## 2022-09-01 MED ORDER — MIDAZOLAM HCL 5 MG/5ML IJ SOLN
INTRAMUSCULAR | Status: DC | PRN
  Administered 2022-09-01: 1 mg via INTRAVENOUS

## 2022-09-01 MED ORDER — ROCURONIUM BROMIDE 10 MG/ML (PF) SYRINGE
PREFILLED_SYRINGE | INTRAVENOUS | Status: DC | PRN
  Administered 2022-09-01: 10 mg via INTRAVENOUS
  Administered 2022-09-01: 50 mg via INTRAVENOUS

## 2022-09-01 MED ORDER — GLUCAGON HCL RDNA (DIAGNOSTIC) 1 MG IJ SOLR
INTRAMUSCULAR | Status: DC | PRN
  Administered 2022-09-01 (×2): .5 mg via INTRAVENOUS

## 2022-09-01 MED ORDER — ONDANSETRON HCL 4 MG/2ML IJ SOLN
INTRAMUSCULAR | Status: DC | PRN
  Administered 2022-09-01: 4 mg via INTRAVENOUS

## 2022-09-01 MED ORDER — FENTANYL CITRATE (PF) 100 MCG/2ML IJ SOLN
INTRAMUSCULAR | Status: DC | PRN
  Administered 2022-09-01 (×2): 50 ug via INTRAVENOUS

## 2022-09-01 MED ORDER — LIDOCAINE 2% (20 MG/ML) 5 ML SYRINGE
INTRAMUSCULAR | Status: DC | PRN
  Administered 2022-09-01: 40 mg via INTRAVENOUS

## 2022-09-01 MED ORDER — GLUCAGON HCL RDNA (DIAGNOSTIC) 1 MG IJ SOLR
INTRAMUSCULAR | Status: AC
  Filled 2022-09-01: qty 1

## 2022-09-01 MED ORDER — PROPOFOL 10 MG/ML IV BOLUS
INTRAVENOUS | Status: DC | PRN
  Administered 2022-09-01: 200 mg via INTRAVENOUS

## 2022-09-01 MED ORDER — SCOPOLAMINE 1 MG/3DAYS TD PT72
1.0000 | MEDICATED_PATCH | TRANSDERMAL | Status: DC
  Administered 2022-09-01: 1 via TRANSDERMAL

## 2022-09-01 MED ORDER — DICLOFENAC SUPPOSITORY 100 MG
RECTAL | Status: DC | PRN
  Administered 2022-09-01: 100 mg via RECTAL

## 2022-09-01 MED ORDER — FENTANYL CITRATE PF 50 MCG/ML IJ SOSY
25.0000 ug | PREFILLED_SYRINGE | INTRAMUSCULAR | Status: DC | PRN
  Administered 2022-09-01: 25 ug via INTRAVENOUS
  Filled 2022-09-01: qty 1

## 2022-09-01 MED ORDER — CHLORHEXIDINE GLUCONATE CLOTH 2 % EX PADS
6.0000 | MEDICATED_PAD | Freq: Every day | CUTANEOUS | Status: DC
  Administered 2022-09-01 – 2022-09-05 (×5): 6 via TOPICAL

## 2022-09-01 MED ORDER — SODIUM CHLORIDE 0.9 % IV SOLN: INTRAVENOUS | Status: DC

## 2022-09-01 MED ORDER — SCOPOLAMINE 1 MG/3DAYS TD PT72
MEDICATED_PATCH | TRANSDERMAL | Status: AC
  Filled 2022-09-01: qty 1

## 2022-09-01 MED ORDER — PHENYLEPHRINE 80 MCG/ML (10ML) SYRINGE FOR IV PUSH (FOR BLOOD PRESSURE SUPPORT)
PREFILLED_SYRINGE | INTRAVENOUS | Status: DC | PRN
  Administered 2022-09-01 (×3): 160 ug via INTRAVENOUS

## 2022-09-01 MED ORDER — SUGAMMADEX SODIUM 200 MG/2ML IV SOLN
INTRAVENOUS | Status: DC | PRN
  Administered 2022-09-01: 300 mg via INTRAVENOUS

## 2022-09-01 MED ORDER — ALBUMIN HUMAN 5 % IV SOLN
INTRAVENOUS | Status: AC
  Filled 2022-09-01: qty 250

## 2022-09-01 MED ORDER — FENTANYL CITRATE (PF) 100 MCG/2ML IJ SOLN
INTRAMUSCULAR | Status: AC
  Filled 2022-09-01: qty 2

## 2022-09-01 MED ORDER — ALBUMIN HUMAN 5 % IV SOLN: INTRAVENOUS | Status: DC | PRN

## 2022-09-01 MED ORDER — MIDAZOLAM HCL 2 MG/2ML IJ SOLN
INTRAMUSCULAR | Status: AC
  Filled 2022-09-01: qty 2

## 2022-09-01 MED ORDER — PHENYLEPHRINE HCL-NACL 20-0.9 MG/250ML-% IV SOLN
INTRAVENOUS | Status: DC | PRN
  Administered 2022-09-01: 25 ug/min via INTRAVENOUS

## 2022-09-01 MED ORDER — DEXAMETHASONE SODIUM PHOSPHATE 10 MG/ML IJ SOLN
INTRAMUSCULAR | Status: DC | PRN
  Administered 2022-09-01: 8 mg via INTRAVENOUS

## 2022-09-01 MED ORDER — CIPROFLOXACIN IN D5W 400 MG/200ML IV SOLN
INTRAVENOUS | Status: DC | PRN
  Administered 2022-09-01: 400 mg via INTRAVENOUS

## 2022-09-01 NOTE — Transfer of Care (Signed)
Immediate Anesthesia Transfer of Care Note  Patient: Debbie [name]  Procedure(s) Performed: ENDOSCOPIC RETROGRADE CHOLANGIOPANCREATOGRAPHY (ERCP) SPHINCTEROTOMY  Patient Location: Endoscopy Unit  Anesthesia Type:General  Level of Consciousness: drowsy  Airway & Oxygen Therapy: Patient Spontanous Breathing and Patient connected to face mask  Post-op Assessment: Report given to RN and Post -op Vital signs reviewed and stable  Post vital signs: Reviewed and stable  Last Vitals:  Vitals Value Taken Time  BP    Temp    Pulse 92 09/01/22 1446  Resp    SpO2 98 % 09/01/22 1446  Vitals shown include unvalidated device data.  Last Pain:  Vitals:   09/01/22 1154  TempSrc: Temporal  PainSc: 0-No pain      Patients Stated Pain Goal: 3 (09/01/22 0234)  Complications: No notable events documented.

## 2022-09-01 NOTE — Anesthesia Procedure Notes (Signed)
Procedure Name: Intubation Date/Time: 09/01/2022 12:47 PM  Performed by: Elisabeth Cara, CRNAPre-anesthesia Checklist: Patient identified, Emergency Drugs available, Suction available, Patient being monitored and Timeout performed Patient Re-evaluated:Patient Re-evaluated prior to induction Oxygen Delivery Method: Circle system utilized Preoxygenation: Pre-oxygenation with 100% oxygen Induction Type: IV induction Ventilation: Mask ventilation without difficulty Laryngoscope Size: Mac and 4 Grade View: Grade I Tube type: Oral Tube size: 7.5 mm Number of attempts: 1 Airway Equipment and Method: Stylet Placement Confirmation: ETT inserted through vocal cords under direct vision, positive ETCO2 and breath sounds checked- equal and bilateral Secured at: 22 cm Tube secured with: Tape Dental Injury: Teeth and Oropharynx as per pre-operative assessment

## 2022-09-01 NOTE — Anesthesia Preprocedure Evaluation (Addendum)
Anesthesia Evaluation  Patient identified by MRN, date of birth, ID band Patient awake    Reviewed: Allergy & Precautions, NPO status , Patient's Chart, lab work & pertinent test results  History of Anesthesia Complications (+) PONV and history of anesthetic complications  Airway Mallampati: II  TM Distance: >3 FB Neck ROM: Full    Dental  (+) Dental Advisory Given   Pulmonary neg pulmonary ROS   breath sounds clear to auscultation       Cardiovascular hypertension, Pt. on medications  Rhythm:Regular Rate:Normal     Neuro/Psych negative neurological ROS     GI/Hepatic negative GI ROS,,,Liver mets and now with pancreatic mass with CBD obstruction.   Endo/Other  Hypothyroidism    Renal/GU      Musculoskeletal  (+) Arthritis ,    Abdominal   Peds  Hematology  (+) Blood dyscrasia, anemia   Anesthesia Other Findings Metastatic breast CA with mets to liver, lung, bone   Reproductive/Obstetrics                             Anesthesia Physical Anesthesia Plan  ASA: 3  Anesthesia Plan: General   Post-op Pain Management:    Induction: Intravenous  PONV Risk Score and Plan: 4 or greater and Dexamethasone, Ondansetron and Treatment may vary due to age or medical condition  Airway Management Planned: Oral ETT  Additional Equipment:   Intra-op Plan:   Post-operative Plan: Extubation in OR  Informed Consent: I have reviewed the patients History and Physical, chart, labs and discussed the procedure including the risks, benefits and alternatives for the proposed anesthesia with the patient or authorized representative who has indicated his/her understanding and acceptance.   Patient has DNR.  Discussed DNR with patient and Continue DNR.   Dental advisory given  Plan Discussed with: CRNA  Anesthesia Plan Comments:        Anesthesia Quick Evaluation

## 2022-09-01 NOTE — Progress Notes (Signed)
Initial Nutrition Assessment  DOCUMENTATION CODES:   Obesity unspecified  INTERVENTION:   -Will monitor for diet and supplement needs  NUTRITION DIAGNOSIS:   Increased nutrient needs related to chronic illness, cancer and cancer related treatments as evidenced by estimated needs.  GOAL:   Patient will meet greater than or equal to 90% of their needs  MONITOR:   Diet advancement, Labs, Weight trends, I & O's  REASON FOR ASSESSMENT:   Malnutrition Screening Tool    ASSESSMENT:   57 y.o. female with medical history significant of anxiety, osteoarthritis, chronic back pain, constipation, depression, headache, heart murmur, hypothyroidism, status post treatment with radioactive iodine, posttreatment hypothyroidism, hyperlipidemia, hypertension, menorrhagia, multiple pulmonary nodules, history of stress urinary incontinence, class III obesity, relapsing breast cancer with bone, hepatic and pulmonary metastasis treated with surgery, radiation and chemotherapy, postoperative right upper extremity lymphedema who was sent from the cancer center due to hyperbilirubinemia.  Patient not in room at time of visit, having ERCP today.  Per chart review, pt with history of metastatic breast cancer, undergoing treatment. Did not show for nutrition appointment at [facility] in November 2023.   Pt was NPO for procedure. Did consume 100% of dinner last night. Per GI note, will remain on clear liquids today following ERCP.  Per weight records, pt has lost 18 lbs since 2/8 (6% wt loss x 2.5 months, insignificant for time frame).  Medications: Vitamin B-12, KLOR-CON  Labs reviewed: Low K Elevated Phos Elevated Mg    NUTRITION - FOCUSED PHYSICAL EXAM:  Unable to complete at this time.  Diet Order:   Diet Order             Diet NPO time specified  Diet effective now                   EDUCATION NEEDS:   Not appropriate for education at this time  Skin:  Skin Assessment:  Reviewed RN Assessment  Last BM:  4/22  Height:   Ht Readings from Last 1 Encounters:  08/31/22  (1.676 m)    Weight:   Wt Readings from Last 1 Encounters:  08/31/22 126.6 kg    BMI:  Body mass index is 45.05 kg/m.  Estimated Nutritional Needs:   Kcal:  1750-1950  Protein:  80-90g  Fluid:  1.9L/day   Tilda Franco, MS, RD, LDN Inpatient Clinical Dietitian Contact information available via Amion

## 2022-09-01 NOTE — Progress Notes (Signed)
PROGRESS NOTE    Debbie [name]  XLK:[id number] DOB: 1965/06/07 DOA: 08/31/2022 PCP: Ollen Bowl, MD     Brief Narrative:  Debbie [name] is a 57 y.o. female with medical history significant of anxiety, osteoarthritis, chronic back pain, constipation, depression, headache, heart murmur, hypothyroidism, status post treatment with radioactive iodine, posttreatment hypothyroidism, hyperlipidemia, hypertension, menorrhagia, multiple pulmonary nodules, history of stress urinary incontinence, class III obesity, relapsing breast cancer with bone, hepatic and pulmonary metastasis treated with surgery, radiation and chemotherapy, postoperative right upper extremity lymphedema who was sent from the cancer center due to hyperbilirubinemia.  She has developed scleral icterus.  Her stools have been lighter and urine a lot darker since last week.    MRCP done on Friday and revealed a 2.2 x 2.0 cm pancreatic mass with distended gallbladder with severe intra and extrahepatic biliary ductal dilatation.  The central CBD is sharply narrow within the central pancreatic head and reconstituted above the ampulla.  Patient was admitted to the hospital and GI consulted for ERCP.  New events last 24 hours / Subjective: Patient is feeling well, she has no complaints of abdominal pain.  Assessment & Plan:  Principal Problem:   Hyperbilirubinemia Active Problems:   Hypertension   Hypothyroid   Bilateral malignant neoplasm of breast in female, estrogen receptor positive   Malignant neoplasm metastatic to bone   Malignant neoplasm metastatic to liver   Morbid obesity with BMI of 40.0-44.9, adult   Malignant neoplasm metastatic to lung   Hypercholesterolemia   Pancreatic mass   Hyperphosphatemia   Hypokalemia   Normocytic anemia   Obstructive jaundice due to cancer   Carcinoma of breast metastatic to multiple sites   Pancreatic mass with concern for biliary obstruction, hyperbilirubinemia -GI  consulted -ERCP planned for today  AKI -Baseline creatinine 0.9 -Renal ultrasound negative for hydronephrosis -IV fluid  Hypertension -Losartan, HCTZ, Lasix on hold due to AKI  Hypothyroidism -Synthroid  Metastatic breast cancer with mets to bone, liver, lung -Followed by Dr. Pamelia Hoit  Obesity -BMI 45  Hypokalemia -Replace   DVT prophylaxis:  SCDs Start: 08/31/22 0927  Code Status: DNR Family Communication: No family at bedside Disposition Plan: Home Status is: Inpatient Remains inpatient appropriate because: ERCP today, IV fluid    Antimicrobials:  Anti-infectives (From admission, onward)    None        Objective: Vitals:   08/31/22 1715 08/31/22 2053 09/01/22 0455 09/01/22 1154  BP: (!) 100/57 111/63 102/66 123/62  Pulse: 87 90 81 84  Resp: Temp: 97.8 F (36.6 C) 97.7 F (36.5 C) 98.1 F (36.7 C) (!) 97.2 F (36.2 C)  TempSrc: Oral Oral Oral Temporal  SpO2: 98% 95% 98% 96%  Weight:      Height:        Intake/Output Summary (Last 24 hours) at 09/01/2022 1232 Last data filed at 08/31/2022 1833 Gross per 24 hour  Intake 867.57 ml  Output --  Net 867.57 ml   Filed Weights   08/31/22 0900  Weight: 126.6 kg    Examination:  General exam: Appears calm and comfortable, with scleral icterus Respiratory system: Clear to auscultation. Respiratory effort normal. No respiratory distress. No conversational dyspnea.  Cardiovascular system: S1 & S2 heard, RRR. No murmurs. No pedal edema. Gastrointestinal system: Abdomen is soft and nontender. Normal bowel sounds heard. Central nervous system: Alert and oriented. No focal neurological deficits. Speech clear.  Extremities: Symmetric in appearance  Skin: No rashes, lesions or ulcers  on exposed skin  Psychiatry: Judgement and insight appear normal. Mood & affect appropriate.   Data Reviewed: I have personally reviewed following labs and imaging studies  CBC: Recent Labs  Lab 08/26/22 1019  08/27/22 0922 08/31/22 0739 09/01/22 0500  WBC 5.9 5.9 5.9 6.6  NEUTROABS 4.4 4.5 4.5 5.1  HGB 11.6* 11.4* 11.1* 9.9*  HCT 34.3* 33.5* 32.2* 31.0*  MCV 89.6 89.1 88.0 92.8  PLT 232 247 287 261   Basic Metabolic Panel: Recent Labs  Lab 08/26/22 1019 08/26/22 1032 08/27/22 0922 08/31/22 0739 09/01/22 0500  NA 139  --  138 137 135  K 2.9*  --  2.9* 2.9* 3.3*  CL 100  --  100 99 96*  CO2 30  --  GLUCOSE 104*  --  105* 120* 130*  BUN 14  --  17 26* 37*  CREATININE 0.90  --  1.14* 2.19* 2.83*  CALCIUM 10.6*  --  10.5* 10.8* 10.3  MG  --  1.7  --  1.7 2.7*  PHOS  --  3.5  --  5.3* 6.9*   GFR: Estimated Creatinine Clearance: 29.5 mL/min (A) (by C-G formula based on SCr of 2.83 mg/dL (H)). Liver Function Tests: Recent Labs  Lab 08/26/22 1019 08/27/22 0922 08/31/22 0739 09/01/22 0500  AST 164* 178* 204* 198*  ALT 178* 172* 171* 157*  ALKPHOS 589* 598* 753* 684*  BILITOT 7.1* 7.7* 10.9* 11.3*  PROT 7.6 7.4 7.3 6.9  ALBUMIN 3.6 3.4* 3.3* 2.4*   No results for input(s): "LIPASE", "AMYLASE" in the last 168 hours. No results for input(s): "AMMONIA" in the last 168 hours. Coagulation Profile: Recent Labs  Lab 09/01/22 0528  INR 1.1   Cardiac Enzymes: No results for input(s): "CKTOTAL", "CKMB", "CKMBINDEX", "TROPONINI" in the last 168 hours. BNP (last 3 results) No results for input(s): "PROBNP" in the last 8760 hours. HbA1C: No results for input(s): "HGBA1C" in the last 72 hours. CBG: Recent Labs  Lab 09/01/22 1201  GLUCAP 98   Lipid Profile: No results for input(s): "CHOL", "HDL", "LDLCALC", "TRIG", "CHOLHDL", "LDLDIRECT" in the last 72 hours. Thyroid Function Tests: No results for input(s): "TSH", "T4TOTAL", "FREET4", "T3FREE", "THYROIDAB" in the last 72 hours. Anemia Panel: No results for input(s): "VITAMINB12", "FOLATE", "FERRITIN", "TIBC", "IRON", "RETICCTPCT" in the last 72 hours. Sepsis Labs: No results for input(s): "PROCALCITON",  "LATICACIDVEN" in the last 168 hours.  No results found for this or any previous visit (from the past 240 hour(s)).    Radiology Studies: US RENAL  Result Date: 09/01/2022 CLINICAL DATA:  Acute renal dysfunction EXAM: RENAL / URINARY TRACT ULTRASOUND COMPLETE COMPARISON:  08/27/2022 FINDINGS: Right Kidney: Renal measurements: 11.9 x 5 x 6.8 cm = volume: 210.9 mL. Echogenicity within normal limits. No mass or hydronephrosis visualized. Left Kidney: Renal measurements: 11.5 x 5.8 x 5.7 cm = volume: 199.8 mL. Increased cortical echogenicity maybe a technical artifact related to patient's body habitus. No mass or hydronephrosis visualized. Bladder: Appears normal for degree of bladder distention. Other: Examination is technically limited due to patient's body habitus. IMPRESSION: No hydronephrosis. Electronically Signed   By: Ernie Avena M.D.   On: 09/01/2022 10:38      Scheduled Meds:  [MAR Hold] cyanocobalamin  1,000 mcg Oral Daily   [MAR Hold] levothyroxine  175 mcg Oral q AM   [MAR Hold] loratadine  10 mg Oral Daily   [MAR Hold] pantoprazole  40 mg Oral Daily   [MAR Hold] pregabalin  100  mg Oral TID   [MAR Hold] scopolamine  1 patch Transdermal Q72H   [MAR Hold] sertraline  100 mg Oral Daily   Continuous Infusions:  sodium chloride 20 mL/hr at 09/01/22 1213   sodium chloride 100 mL/hr at 09/01/22 0815     LOS: 1 day   Time spent: 30 minutes   Noralee Stain, DO [facility] 09/01/2022, 12:32 PM   Available via Epic secure chat 7am-7pm After these hours, please refer to coverage provider listed on [email or web address]

## 2022-09-01 NOTE — Brief Op Note (Addendum)
09/01/2022  2:37 PM  PATIENT:  Debbie [name]  57 y.o. female  PRE-OPERATIVE DIAGNOSIS:  obstructive jaundice  POST-OPERATIVE DIAGNOSIS:  Bile duct stricture  PROCEDURE:  Procedure(s): ENDOSCOPIC RETROGRADE CHOLANGIOPANCREATOGRAPHY (ERCP) (N/A) SPHINCTEROTOMY  SURGEON:  Surgeon(s) and Role:    * Iva Boop, MD - Primary  ANESTHESIA:   general  EBL:  0 mL   BLOOD ADMINISTERED:none  DRAINS:  3 cm 4 Fr pigtail pancreatic stent, 5 cm 8.5 fr biliary stent (both plastic)   7 cm 10 Fr plastic sten passed into intestine  LOCAL MEDICATIONS USED:  NONE  SPECIMEN:  No Specimen  DISPOSITION OF SPECIMEN:  N/A  PATIENT DISPOSITION:   Endoscopy recovery   Delay start of Pharmacological VTE agent (>24hrs) due to surgical blood loss or risk of bleeding: not applicable  Cipro 400 mg IV  Diclofenac 100 mg per rectum  Esophagus blindly intubated Stomach and duodenum NL Papilla normal, no bile Papilla accessed w/ wire via sphincterotome - could not enter bile duct. Pancreatic duct entered, wire left in place, small contrast injection also. Bile duct then entered w/ wire and cannulated - distal 1-2 cm biliary stricture w/ upstream dilation noted - extra and intrahepatic. Attempted 10 Fr 7 cm stent placement but would not pass. Withdrawn and stent dislodged in scope so I pushed it out into intestine. Recannulated the biliary tree and biliary sphincterotomy performed.  During this procedure, the sphincterotome and wire separated.  Sphincterotome removed and another inserted over the wire and contrast injected and no suspicious leaks seen.  Biliary tract was intact.  I then placed an 8.5 French 5 cm stent without difficulty into the bile duct with copious flow of dark bile.  Attention turned to the pancreatic duct where the wire was then placed in a 4 French 3 cm single pigtail stent was placed to reduce the chances of post ERCP pancreatitis.  Position confirmed multiple times, it was  difficult to see under fluoroscopy given body habitus.  On one of the fluoroscopy images it looks like the stent that was unsuccessful and had to be pushed out into the intestine was seen in the intestine.  It had migrated so I was unable to retrieve that.  Plan is clear liquids tonight recheck labs in the morning close follow-up and I will determine a time for repeat x-ray to see if the pancreatic duct stent has passed.  I have increased IV fluids to 125 cc/h.

## 2022-09-01 NOTE — Progress Notes (Signed)
[facility] Progress Note   CC:  Hyperbilirubinemia, biliary obstruction, pancreatic mass   Subjective: She remains NPO for ERCP later today.  No nausea or vomiting.  No abdominal pain.  No BM today.  No chest pain or shortness of breath.  She has a history of postoperative nausea/vomiting and she questions if she can receive a Scopolamine patch following her ERCP.   Objective:  Vital signs in last 24 hours: Temp:  [97.7 F (36.5 C)-98.7 F (37.1 C)] 98.1 F (36.7 C) (04/23 0455) Pulse Rate:  [81-105] 81 (04/23 0455) Resp:  [16-19] 18 (04/23 0455) BP: (98-128)/(57-93) 102/66 (04/23 0455) SpO2:  [95 %-100 %] 98 % (04/23 0455) Weight:  [126.6 kg] 126.6 kg (04/22 0900) Last BM Date : 08/31/22 General: 57 year old female in no acute distress. Eyes: Scleral icterus present. Heart: Regular rate and rhythm, no murmurs. Pulm: Breath sounds clear throughout. Abdomen: Obese abdomen, soft, nontender.  Positive bowel sounds to all 4 quadrants.  No palpable mass. Extremities:  Without edema. Neurologic:  Alert and  oriented x 4. Grossly normal neurologically. Psych:  Alert and cooperative. Normal mood and affect.  Intake/Output from previous day: 04/22 0701 - 04/23 0700 In: 867.6 [P.O.:480; IV Piggyback:387.6] Out: -  Intake/Output this shift: No intake/output data recorded.  Lab Results: Recent Labs    08/31/22 0739 09/01/22 0500  WBC 5.9 6.6  HGB 11.1* 9.9*  HCT 32.2* 31.0*  PLT 287 261   BMET Recent Labs    08/31/22 0739 09/01/22 0500  NA 137 135  K 2.9* 3.3*  CL 99 96*  CO2 26 24  GLUCOSE 120* 130*  BUN 26* 37*  CREATININE 2.19* 2.83*  CALCIUM 10.8* 10.3   LFT Recent Labs    09/01/22 0500  PROT 6.9  ALBUMIN 2.4*  AST 198*  ALT 157*  ALKPHOS 684*  BILITOT 11.3*  BILIDIR 7.2*  IBILI 4.1*   PT/INR Recent Labs    09/01/22 0528  LABPROT 14.1  INR 1.1   Hepatitis Panel No results for input(s): "HEPBSAG", "HCVAB", "HEPAIGM",  "HEPBIGM" in the last 72 hours.  No results found.  Assessment / Plan:  57 year old female with metastatic breast cancer directly admitted to the hospital secondary to hyperbilirubinemia, elevated LFTs concerning for biliary obstruction. Abdominal MRI/MRCP 08/28/2022 identified innumerable enhancing liver metastasis a distended gallbladder, intra and extrahepatic biliary dilatation, central CBD sharply narrowed within the central pancreatic head and reconstituted above the ampulla with an obstructing mass to the head of the pancreas measuring 2.2 cm. Numerous enlarged retroperitoneal lymph nodes, numerous metastatic pulmonary nodules and widespread bone metastasis.  Recently developed right shoulder pain. No chest or abdominal pain. No N/V. Afebrile.  Hemodynamically stable. T. Bili 10.9 -> 11.3. Direct bili 7.2. Alk phos 753-> 684. AST 204 -> 198. ALT 171 -> 157. INR 1.1.  -Proceed with ERCP today as planned -NPO  -NS IV @ 100cc/hr -Patient requesting Scopolamine patch post ERCP  Normocytic anemia. No overt GI bleeding. Hg 11.1 -> 9.9.   AKI. Cr 1.4 on 4/18 -> 2.19 -> today Cr 2.83.  -NS IV @ 100cc/hr -Hold Lasix -Defer further management to Dr. Alvino Chapel  Hypokalemia. K+ 3.3. -KCL replacement per Dr. Alvino Chapel  GERD -Pantoprazole 40 mg p.o. daily  Principal Problem:   Hyperbilirubinemia Active Problems:   Hypertension   Hypothyroid   Bilateral malignant neoplasm of breast in female, estrogen receptor positive   Malignant neoplasm metastatic to bone   Malignant neoplasm metastatic to liver  Morbid obesity with BMI of 40.0-44.9, adult   Malignant neoplasm metastatic to lung   Hypercholesterolemia   Pancreatic mass   Hyperphosphatemia   Hypokalemia   Normocytic anemia   Obstructive jaundice due to cancer   Carcinoma of breast metastatic to multiple sites     LOS: 1 day   Arnaldo Natal  09/01/2022, 8:36AM

## 2022-09-02 ENCOUNTER — Encounter: Payer: Self-pay | Admitting: *Deleted

## 2022-09-02 ENCOUNTER — Other Ambulatory Visit: Payer: Self-pay

## 2022-09-02 DIAGNOSIS — C50919 Malignant neoplasm of unspecified site of unspecified female breast: Secondary | ICD-10-CM | POA: Diagnosis not present

## 2022-09-02 DIAGNOSIS — C801 Malignant (primary) neoplasm, unspecified: Secondary | ICD-10-CM | POA: Diagnosis not present

## 2022-09-02 DIAGNOSIS — K831 Obstruction of bile duct: Secondary | ICD-10-CM | POA: Diagnosis not present

## 2022-09-02 DIAGNOSIS — C787 Secondary malignant neoplasm of liver and intrahepatic bile duct: Secondary | ICD-10-CM | POA: Diagnosis not present

## 2022-09-02 LAB — CBC WITH DIFFERENTIAL/PLATELET
Abs Immature Granulocytes: 0.09 10*3/uL — ABNORMAL HIGH (ref 0.00–0.07)
Basophils Absolute: 0 10*3/uL (ref 0.0–0.1)
Basophils Relative: 0 %
Eosinophils Absolute: 0 10*3/uL (ref 0.0–0.5)
Eosinophils Relative: 0 %
HCT: 30 % — ABNORMAL LOW (ref 36.0–46.0)
Hemoglobin: 9.5 g/dL — ABNORMAL LOW (ref 12.0–15.0)
Immature Granulocytes: 1 %
Lymphocytes Relative: 6 %
Lymphs Abs: 0.5 10*3/uL — ABNORMAL LOW (ref 0.7–4.0)
MCH: 29.3 pg (ref 26.0–34.0)
MCHC: 31.7 g/dL (ref 30.0–36.0)
MCV: 92.6 fL (ref 80.0–100.0)
Monocytes Absolute: 0.3 10*3/uL (ref 0.1–1.0)
Monocytes Relative: 4 %
Neutro Abs: 7.4 10*3/uL (ref 1.7–7.7)
Neutrophils Relative %: 89 %
Platelets: 290 10*3/uL (ref 150–400)
RBC: 3.24 MIL/uL — ABNORMAL LOW (ref 3.87–5.11)
RDW: 23.2 % — ABNORMAL HIGH (ref 11.5–15.5)
WBC: 8.3 10*3/uL (ref 4.0–10.5)
nRBC: 0 % (ref 0.0–0.2)

## 2022-09-02 LAB — LIPASE, BLOOD: Lipase: 70 U/L — ABNORMAL HIGH (ref 11–51)

## 2022-09-02 LAB — GLUCOSE, CAPILLARY
Glucose-Capillary: 113 mg/dL — ABNORMAL HIGH (ref 70–99)
Glucose-Capillary: 130 mg/dL — ABNORMAL HIGH (ref 70–99)
Glucose-Capillary: 104 mg/dL — ABNORMAL HIGH (ref 70–99)
Glucose-Capillary: 185 mg/dL — ABNORMAL HIGH (ref 70–99)

## 2022-09-02 LAB — BASIC METABOLIC PANEL
Anion gap: 12 (ref 5–15)
BUN: 35 mg/dL — ABNORMAL HIGH (ref 6–20)
CO2: 20 mmol/L — ABNORMAL LOW (ref 22–32)
Calcium: 10.2 mg/dL (ref 8.9–10.3)
Chloride: 102 mmol/L (ref 98–111)
Creatinine, Ser: 1.69 mg/dL — ABNORMAL HIGH (ref 0.44–1.00)
GFR, Estimated: 35 mL/min — ABNORMAL LOW (ref >60)
Glucose, Bld: 134 mg/dL — ABNORMAL HIGH (ref 70–99)
Potassium: 3.6 mmol/L (ref 3.5–5.1)
Sodium: 134 mmol/L — ABNORMAL LOW (ref 135–145)

## 2022-09-02 LAB — HEPATIC FUNCTION PANEL
ALT: 170 U/L — ABNORMAL HIGH (ref 0–44)
AST: 271 U/L — ABNORMAL HIGH (ref 15–41)
Albumin: 2.6 g/dL — ABNORMAL LOW (ref 3.5–5.0)
Alkaline Phosphatase: 777 U/L — ABNORMAL HIGH (ref 38–126)
Bilirubin, Direct: 7.5 mg/dL — ABNORMAL HIGH (ref 0.0–0.2)
Indirect Bilirubin: 3.7 mg/dL — ABNORMAL HIGH (ref 0.3–0.9)
Total Bilirubin: 11.2 mg/dL — ABNORMAL HIGH (ref 0.3–1.2)
Total Protein: 7.3 g/dL (ref 6.5–8.1)

## 2022-09-02 MED ORDER — OXYCODONE HCL 5 MG PO TABS: 5.0000 mg | ORAL_TABLET | ORAL | Status: DC | PRN

## 2022-09-02 MED ORDER — DIPHENHYDRAMINE HCL 25 MG PO CAPS
25.0000 mg | ORAL_CAPSULE | Freq: Four times a day (QID) | ORAL | Status: AC | PRN
  Administered 2022-09-02: 25 mg via ORAL
  Filled 2022-09-02: qty 1

## 2022-09-02 MED ORDER — DIPHENHYDRAMINE HCL 25 MG PO CAPS
25.0000 mg | ORAL_CAPSULE | Freq: Once | ORAL | Status: AC | PRN
  Administered 2022-09-02: 25 mg via ORAL
  Filled 2022-09-02: qty 1

## 2022-09-02 MED ORDER — OXYCODONE HCL 5 MG PO TABS
10.0000 mg | ORAL_TABLET | ORAL | Status: DC | PRN
  Administered 2022-09-03 – 2022-09-05 (×4): 10 mg via ORAL
  Filled 2022-09-02 (×4): qty 2

## 2022-09-02 MED ORDER — ACETAMINOPHEN 325 MG PO TABS
650.0000 mg | ORAL_TABLET | Freq: Four times a day (QID) | ORAL | Status: DC | PRN
  Administered 2022-09-03: 650 mg via ORAL
  Filled 2022-09-02: qty 2

## 2022-09-02 MED ORDER — FENTANYL CITRATE PF 50 MCG/ML IJ SOSY
25.0000 ug | PREFILLED_SYRINGE | INTRAMUSCULAR | Status: DC | PRN
  Administered 2022-09-02 – 2022-09-03 (×5): 25 ug via INTRAVENOUS
  Filled 2022-09-02 (×5): qty 1

## 2022-09-02 MED ORDER — DIPHENHYDRAMINE HCL 25 MG PO CAPS
25.0000 mg | ORAL_CAPSULE | Freq: Four times a day (QID) | ORAL | Status: AC | PRN
  Administered 2022-09-02 – 2022-09-03 (×2): 25 mg via ORAL
  Filled 2022-09-02 (×2): qty 1

## 2022-09-02 NOTE — Progress Notes (Addendum)
[facility] Progress Note  CC:  Hyperbilirubinemia, biliary obstruction, pancreatic mass   Subjective: No nausea or vomiting.  No abdominal pain.  No chest pain or shortness of breath.  She passed a light beige colored stool earlier today.  No bloody or black stools.   Objective:  Vital signs in last 24 hours: Temp:  [97.2 F (36.2 C)-97.5 F (36.4 C)] 97.3 F (36.3 C) (04/24 0542) Pulse Rate:  [77-93] 77 (04/24 0542) Resp:  [12-18] 18 (04/24 0542) BP: (103-141)/(57-72) 125/61 (04/24 0542) SpO2:  [92 %-98 %] 95 % (04/24 0542) Last BM Date : 08/31/22 General: 57 year old obese female in no acute distress. Eyes: Moderate scleral icterus. Heart: Regular rate and rhythm, no murmurs. Pulm: Breath sounds clear throughout. Abdomen: Soft, mild gaseous distention.  Nontender.  Positive bowel sounds all 4 quadrants. Extremities: No edema. Neurologic:  Alert and oriented x 4. Grossly normal neurologically. Psych:  Alert and cooperative. Normal mood and affect.  Intake/Output from previous day: 04/23 0701 - 04/24 0700 In: 2041.8 [P.O.:400; I.V.:1191.8; IV Piggyback:450] Out: 0   Lab Results: Recent Labs    08/31/22 0739 09/01/22 0500 09/02/22 0531  WBC 5.9 6.6 8.3  HGB 11.1* 9.9* 9.5*  HCT 32.2* 31.0* 30.0*  PLT 287 261 290   BMET Recent Labs    08/31/22 0739 09/01/22 0500 09/02/22 0531  NA 137 135 134*  K 2.9* 3.3* 3.6  CL 99 96* 102  CO2 26 24 20*  GLUCOSE 120* 130* 134*  BUN 26* 37* 35*  CREATININE 2.19* 2.83* 1.69*  CALCIUM 10.8* 10.3 10.2   Recent Labs  Lab 08/27/22 0922 08/31/22 0739 09/01/22 0500 09/01/22 0528 09/02/22 0531  AST 178* 204* 198*  --  271*  ALT 172* 171* 157*  --  170*  ALKPHOS 598* 753* 684*  --  777*  BILITOT 7.7* 10.9* 11.3*  --  11.2*  PROT 7.4 7.3 6.9  --  7.3  ALBUMIN 3.4* 3.3* 2.4*  --  2.6*  INR  --   --   --  1.1  --      PT/INR Recent Labs    09/01/22 0528  LABPROT 14.1  INR 1.1     DG  ERCP  Result Date: 09/01/2022 CLINICAL DATA:  ERCP. EXAM: ERCP TECHNIQUE: Multiple spot images obtained with the fluoroscopic device and submitted for interpretation post-procedure. FLUOROSCOPY TIME: FLUOROSCOPY TIME 279.2 mGy COMPARISON:  MRCP-08/28/2022 FINDINGS: 5 spot intraoperative fluoroscopic images of the right upper abdominal quadrant during ERCP are provided for review. Initial image since demonstrates an ERCP probe overlying the right upper abdominal quadrant. Subsequent images demonstrate selective cannulation and opacification of the pancreatic duct with subsequent cannulation and opacification of the common bile duct which appears moderately dilated. There is apparent malignant narrowing/subtotal occlusion involving the distal aspect of the CBD (best seen on image 3). There is minimal opacification of the intrahepatic biliary tree which appears mildly dilated. There is faint opacification of the cystic duct. Subsequent images demonstrate placement of a CBD stent as well as a suspected pancreatic stent, suboptimally evaluated due to patient motion. IMPRESSION: ERCP with placement of CBD and pancreatic duct stents as above. These images were submitted for radiologic interpretation only. Please see the procedural report for the amount of contrast and the fluoroscopy time utilized. Electronically Signed   By: Simonne Come M.D.   On: 09/01/2022 14:53   DG C-Arm 1-60 Min-No Report  Result Date: 09/01/2022 Fluoroscopy was utilized by the requesting physician.  No radiographic interpretation.   US RENAL  Result Date: 09/01/2022 CLINICAL DATA:  Acute renal dysfunction EXAM: RENAL / URINARY TRACT ULTRASOUND COMPLETE COMPARISON:  08/27/2022 FINDINGS: Right Kidney: Renal measurements: 11.9 x 5 x 6.8 cm = volume: 210.9 mL. Echogenicity within normal limits. No mass or hydronephrosis visualized. Left Kidney: Renal measurements: 11.5 x 5.8 x 5.7 cm = volume: 199.8 mL. Increased cortical echogenicity maybe a  technical artifact related to patient's body habitus. No mass or hydronephrosis visualized. Bladder: Appears normal for degree of bladder distention. Other: Examination is technically limited due to patient's body habitus. IMPRESSION: No hydronephrosis. Electronically Signed   By: Ernie Avena M.D.   On: 09/01/2022 10:38    Assessment / Plan:  56 year old female with metastatic breast cancer directly admitted to the hospital 08/31/2022 secondary to hyperbilirubinemia, elevated LFTs concerning for biliary obstruction. Abdominal MRI/MRCP 08/28/2022 identified innumerable enhancing liver metastasis, a distended gallbladder, intra and extrahepatic biliary dilatation, central CBD sharply narrowed within the central pancreatic head and reconstituted above the ampulla with an obstructing mass to the head of the pancreas measuring 2.2 cm. Numerous enlarged retroperitoneal lymph nodes, numerous metastatic pulmonary nodules and widespread bone metastasis. ERCP 4/23 identified a 1 - 2 cm distal CBD stricture, s/p sphincterotomy and placement of biliary and pancreatic duct stents. LFTs mildly trending upward s/p ERCP. T. Bili 7.2 -> 7.5. Alk phos 684 -> 777. AST 198 -> 271. ALT 157 -> 170. WBC 8.3. No N/V. No abdominal pain. Afebrile. Hemodynamically stable.  -Continue to trend LFTs -Eventual abdominal xray to verify if the pancreatic duct stent passed, Dr. Leone Payor to verify timing -Heart healthy diet -LR at 125cc/hr   Normocytic anemia. No overt GI bleeding. Hg 11.1 -> 9.9 -> 9.5.   AKI. Cr 1.4 on 4/18 -> 2.19 -> Cr 2.83 -> today Cr 1.69.    GERD -Pantoprazole 40 mg p.o. daily    LOS: 2 days   Arnaldo Natal  09/02/2022, 11:41AM    [facility] Attending   I have taken an interval history, reviewed the chart and examined the patient. I agree with the Advanced Practitioner's note, impression and recommendations + the following:   LFTs similar and some increased - could be from the  intervention - stent was in good position and bile draining. Hepatic metastases could be contributing. Pruritus not much better, either. - see what LFT's are tomorrow - if not better then will do KUB to check stent position   Iva Boop, MD, [facility] See Loretha Stapler on call - gastroenterology for best contact person 09/02/2022 4:07 PM

## 2022-09-02 NOTE — Anesthesia Postprocedure Evaluation (Signed)
Anesthesia Post Note  Patient: Debbie [name]  Procedure(s) Performed: ENDOSCOPIC RETROGRADE CHOLANGIOPANCREATOGRAPHY (ERCP) SPHINCTEROTOMY BILIARY STENT PLACEMENT PANCREATIC STENT PLACEMENT     Patient location during evaluation: PACU Anesthesia Type: General Level of consciousness: awake and alert Pain management: pain level controlled Vital Signs Assessment: post-procedure vital signs reviewed and stable Respiratory status: spontaneous breathing, nonlabored ventilation, respiratory function stable and patient connected to nasal cannula oxygen Cardiovascular status: blood pressure returned to baseline and stable Postop Assessment: no apparent nausea or vomiting Anesthetic complications: no   No notable events documented.  Last Vitals:  Vitals:   09/01/22 2055 09/02/22 0542  BP: 103/64 125/61  Pulse: 90 77  Resp: 18 18  Temp: (!) 36.4 C (!) 36.3 C  SpO2: 92% 95%    Last Pain:  Vitals:   09/02/22 0542  TempSrc: Oral  PainSc:                  Kennieth Rad

## 2022-09-02 NOTE — Hospital Course (Addendum)
57 year old woman PMH including breast cancer with metastatic disease presented with jaundice.  Admitted for obstructive jaundice symptoms secondary to pancreatic mass, thought secondary to metastatic breast cancer.  Consultants GI  Procedures 4/23 ERCP 4/26 ERCP

## 2022-09-02 NOTE — Progress Notes (Signed)
Received call from [facility] with [facility] pathology stating HER 2 by [facility] can not be added to 2019 pathology report due to insufficient quantity left.  MD notified and verbalized understanding.

## 2022-09-02 NOTE — Progress Notes (Signed)
  Progress Note   Patient: Debbie [name] ZOX:[id number] DOB: 1966/05/08 DOA: 08/31/2022     2 DOS: the patient was seen and examined on 09/02/2022   Brief hospital course:   Assessment and Plan: Obstructive jaundice secondary to pancreatic mass Metastatic breast cancer with metastatic disease to bone, liver, lung .  LFTs still high.  Management per gastroenterology.   AKI Baseline creatinine 0.9 Renal ultrasound negative for hydronephrosis Creatinine improving, down to 1.69 today.  Continue IV fluids.  BMP in AM.   Essential hypertension Losartan, HCTZ, Lasix on hold due to AKI   Hypothyroidism Levothyroxine  Obesity Body mass index is 45.05 kg/m.  Hypokalemia Replaced       Subjective:  Feels ok, has some jaw pain  Physical Exam: Vitals:   09/01/22 1500 09/01/22 1510 09/01/22 2055 09/02/22 0542  BP: (!) 141/72 130/64 103/64 125/61  Pulse: 93 93 90 77  Resp: Temp:   (!) 97.5 F (36.4 C) (!) 97.3 F (36.3 C)  TempSrc:   Oral Oral  SpO2: 96% 95% 92% 95%  Weight:      Height:       Physical Exam Vitals reviewed.  Constitutional:      General: She is not in acute distress.    Appearance: She is not ill-appearing or toxic-appearing.  Cardiovascular:     Rate and Rhythm: Normal rate and regular rhythm.     Heart sounds: No murmur heard. Pulmonary:     Effort: Pulmonary effort is normal. No respiratory distress.     Breath sounds: No wheezing, rhonchi or rales.  Neurological:     Mental Status: She is alert.  Psychiatric:        Behavior: Behavior normal.     Data Reviewed: CBG stable Creatinine down to 1.69 LFTs noted, no significant change in tbili Hgb stable 9.5  Family Communication: husband at bedside  Disposition: Status is: Inpatient Remains inpatient appropriate because: obstructive jaundice  Planned Discharge Destination: Home    Time spent: 20 minutes  Author: Brendia Sacks, MD 09/02/2022 10:13 AM  For on  call review [email or web address].

## 2022-09-02 NOTE — TOC Progression Note (Signed)
Transition of Care [facility]) - Progression Note    Patient Details  Name: FABIANNA KEATS MRN: [id number] Date of Birth: [birth year]  Transition of Care [facility]) CM/SW Contact  Beckie Busing, RN Phone Number:[phone]  09/02/2022, 1:25 PM  Clinical Narrative:     Transition of Care (TOC) Screening Note   Patient Details  Name: DAVONNA ERTL Date of Birth: 02-15-1966   [facility]) CM/SW Contact:    Beckie Busing, RN Phone Number: 09/02/2022, 1:25 PM    Transition of Care Department [facility]) has reviewed patient and no TOC needs have been identified at this time. We will continue to monitor patient advancement through interdisciplinary progression rounds. If new patient transition needs arise, please place a TOC consult.          Expected Discharge Plan and Services                                               Social Determinants of Health (SDOH) Interventions SDOH Screenings   Food Insecurity: No Food Insecurity (08/31/2022)  Housing: Low Risk  (08/31/2022)  Transportation Needs: No Transportation Needs (08/31/2022)  Utilities: Not At Risk (08/31/2022)  Tobacco Use: Low Risk  (09/01/2022)    Readmission Risk Interventions     No data to display

## 2022-09-02 NOTE — Op Note (Signed)
[facility] Patient Name: Debbie [name] Procedure Date: 09/01/2022 MRN: [id number] Attending MD: Iva Boop , MD, [id number] Date of Birth: 09/26/1965 CSN: [id number] Age: 57 Admit Type: Inpatient Procedure:                ERCP Indications:              Jaundice, Malignant tumor of the head of pancreas Providers:                Iva Boop, MD, Fransisca Connors, Irene Shipper,                            Technician, Lauree Chandler Armistead, CRNA Referring MD:             Serena Croissant Medicines:                General Anesthesia, Cipro 400 mg IV Complications:            No immediate complications. Estimated Blood Loss:     Estimated blood loss: none. Procedure:                Pre-Anesthesia Assessment:                           - Prior to the procedure, a History and Physical                            was performed, and patient medications and                            allergies were reviewed. The patient's tolerance of                            previous anesthesia was also reviewed. The risks                            and benefits of the procedure and the sedation                            options and risks were discussed with the patient.                            All questions were answered, and informed consent                            was obtained. Prior Anticoagulants: The patient has                            taken no anticoagulant or antiplatelet agents. ASA                            Grade Assessment: III - A patient with severe                            systemic disease. After reviewing the risks and  benefits, the patient was deemed in satisfactory                            condition to undergo the procedure.                           After obtaining informed consent, the scope was                            passed under direct vision. Throughout the                            procedure, the patient's blood pressure,  pulse, and                            oxygen saturations were monitored continuously. The                            [id number]-[id number] ([id number]) Olympus duodenoscope was                            introduced through the mouth, and used to inject                            contrast into. The [employer] D                            single use duodenoscope was introduced through the                            mouth, and used to inject contrast into and used to                            inject contrast into the bile duct and ventral                            pancreatic duct. The ERCP was somewhat difficult                            due to challenging cannulation. Successful                            completion of the procedure was aided by Wire in                            pancreas. The patient tolerated the procedure well. Scope In: Scope Out: Findings:      The scout film was normal. Esophagus blindly intubated      Stomach and duodenum NL      Papilla normal, no bile      Papilla accessed w/ wire via sphincterotome - could not enter bile duct.       Pancreatic duct entered, wire left in place, small contrast injection       also.      Bile duct  then entered w/ wire and cannulated - distal 1-2 cm biliary       stricture w/ upstream dilation noted - extra and intrahepatic.      Attempted 10 Fr 7 cm stent placement but would not pass. Withdrawn and       stent dislodged in scope so I pushed it out into intestine.      Recannulated the biliary tree and biliary sphincterotomy performed.       During this procedure, the sphincterotome and wire separated.       Sphincterotome removed and another inserted over the wire and contrast       injected and no suspicious leaks seen. Biliary tract was intact. I then       placed an 8.5 French 5 cm stent without difficulty into the bile duct       with copious flow of dark bile.      Attention turned to the pancreatic duct where the wire  was then placed       in a 4 French 3 cm single pigtail stent was placed Impression:               - A single localized biliary stricture was found in                            the common bile duct. The stricture was malignant                            appearing. This stricture was treated with biliary                            sphincterotomy and stent placement. There was                            upstream intra and extrahepatic duct dilation                           Pancreatogram not sufficient to delineate stricture Moderate Sedation:      Not Applicable - Patient had care per Anesthesia. Recommendation:           - Plan is clear liquids tonight recheck labs in the                            morning close follow-up and I will determine a time                            for repeat x-ray to see if the pancreatic duct                            stent has passed. I have increased IV fluids to 125                            cc/h. Procedure Code(s):        --- Professional ---                           [phone], Endoscopic retrograde  cholangiopancreatography (ERCP); with placement of                            endoscopic stent into biliary or pancreatic duct,                            including pre- and post-dilation and guide wire                            passage, when performed, including sphincterotomy,                            when performed, each stent                           [id number], 59, Endoscopic retrograde                            cholangiopancreatography (ERCP); with                            sphincterotomy/papillotomy Diagnosis Code(s):        --- Professional ---                           K83.1, Obstruction of bile duct                           R17, Unspecified jaundice                           C25.0, Malignant neoplasm of head of pancreas CPT copyright 2022 [employer]. All rights reserved. The codes documented in this  report are preliminary and upon coder review may  be revised to meet current compliance requirements. Iva Boop, MD 09/02/2022 11:36:36 AM This report has been signed electronically. Number of Addenda: 0

## 2022-09-02 NOTE — Progress Notes (Signed)
ONCOLOGY  Admitted with obstructive jaundice S/P ERCP Sphincterotomy and stent placements. C/O itching and left jaw pain     09/02/2022    1:45 PM 09/02/2022    5:42 AM 09/01/2022    8:55 PM  Vitals with BMI  Systolic 165 125 161  Diastolic 61 61 64  Pulse 86 77 90      Latest Ref Rng & Units 09/02/2022    5:31 AM 09/01/2022    5:00 AM 08/31/2022    7:39 AM  CBC  WBC 4.0 - 10.5 K/uL 8.3  6.6  5.9   Hemoglobin 12.0 - 15.0 g/dL 9.5  9.9  09.6   Hematocrit 36.0 - 46.0 % 30.0  31.0  32.2   Platelets 150 - 400 K/uL 290  261  287        Latest Ref Rng & Units 09/02/2022    5:31 AM 09/01/2022    5:00 AM 08/31/2022    7:39 AM  CMP  Glucose 70 - 99 mg/dL 045  409  811   BUN 6 - 20 mg/dL 35  37  26   Creatinine 0.44 - 1.00 mg/dL 9.14  7.82  9.56   Sodium 135 - 145 mmol/L 134  135  137   Potassium 3.5 - 5.1 mmol/L 3.6  3.3  2.9   Chloride 98 - 111 mmol/L 102  96  99   CO2 22 - 32 mmol/L Calcium 8.9 - 10.3 mg/dL 21.3  08.6  57.8   Total Protein 6.5 - 8.1 g/dL 7.3  6.9  7.3   Total Bilirubin 0.3 - 1.2 mg/dL 46.9  62.9  52.8   Alkaline Phos 38 - 126 U/L 777  684  753   AST 15 - 41 U/L 271  198  204   ALT 0 - 44 U/L 170  157  171     Met breast cancer: S/P ERCP and stents for obst jaundice. Await lab and clinical improvement Plan for a change of treatment as outpatient once the LFTs get better I am away from tomorrow. Please call Dr.Iruku for any questions

## 2022-09-03 ENCOUNTER — Inpatient Hospital Stay (HOSPITAL_COMMUNITY): Payer: Commercial Managed Care - PPO

## 2022-09-03 DIAGNOSIS — D649 Anemia, unspecified: Secondary | ICD-10-CM | POA: Diagnosis not present

## 2022-09-03 DIAGNOSIS — C801 Malignant (primary) neoplasm, unspecified: Secondary | ICD-10-CM | POA: Diagnosis not present

## 2022-09-03 DIAGNOSIS — K831 Obstruction of bile duct: Secondary | ICD-10-CM | POA: Diagnosis not present

## 2022-09-03 DIAGNOSIS — N179 Acute kidney failure, unspecified: Secondary | ICD-10-CM | POA: Diagnosis not present

## 2022-09-03 LAB — CBC WITH DIFFERENTIAL/PLATELET
Abs Immature Granulocytes: 0.18 10*3/uL — ABNORMAL HIGH (ref 0.00–0.07)
Basophils Absolute: 0 10*3/uL (ref 0.0–0.1)
Basophils Relative: 0 %
Eosinophils Absolute: 0.1 10*3/uL (ref 0.0–0.5)
Eosinophils Relative: 1 %
HCT: 30.6 % — ABNORMAL LOW (ref 36.0–46.0)
Hemoglobin: 9.9 g/dL — ABNORMAL LOW (ref 12.0–15.0)
Immature Granulocytes: 2 %
Lymphocytes Relative: 6 %
Lymphs Abs: 0.5 10*3/uL — ABNORMAL LOW (ref 0.7–4.0)
MCH: 30.1 pg (ref 26.0–34.0)
MCHC: 32.4 g/dL (ref 30.0–36.0)
MCV: 93 fL (ref 80.0–100.0)
Monocytes Absolute: 0.9 10*3/uL (ref 0.1–1.0)
Monocytes Relative: 11 %
Neutro Abs: 6.7 10*3/uL (ref 1.7–7.7)
Neutrophils Relative %: 80 %
Platelets: 324 10*3/uL (ref 150–400)
RBC: 3.29 MIL/uL — ABNORMAL LOW (ref 3.87–5.11)
RDW: 23.9 % — ABNORMAL HIGH (ref 11.5–15.5)
WBC: 8.4 10*3/uL (ref 4.0–10.5)
nRBC: 0.6 % — ABNORMAL HIGH (ref 0.0–0.2)

## 2022-09-03 LAB — HEPATIC FUNCTION PANEL
ALT: 270 U/L — ABNORMAL HIGH (ref 0–44)
AST: 489 U/L — ABNORMAL HIGH (ref 15–41)
Albumin: 2.5 g/dL — ABNORMAL LOW (ref 3.5–5.0)
Alkaline Phosphatase: 1055 U/L — ABNORMAL HIGH (ref 38–126)
Bilirubin, Direct: 8.8 mg/dL — ABNORMAL HIGH (ref 0.0–0.2)
Indirect Bilirubin: 4.5 mg/dL — ABNORMAL HIGH (ref 0.3–0.9)
Total Bilirubin: 13.3 mg/dL — ABNORMAL HIGH (ref 0.3–1.2)
Total Protein: 6.8 g/dL (ref 6.5–8.1)

## 2022-09-03 LAB — GLUCOSE, CAPILLARY
Glucose-Capillary: 107 mg/dL — ABNORMAL HIGH (ref 70–99)
Glucose-Capillary: 119 mg/dL — ABNORMAL HIGH (ref 70–99)
Glucose-Capillary: 97 mg/dL (ref 70–99)
Glucose-Capillary: 106 mg/dL — ABNORMAL HIGH (ref 70–99)

## 2022-09-03 LAB — BASIC METABOLIC PANEL
Anion gap: 11 (ref 5–15)
BUN: 28 mg/dL — ABNORMAL HIGH (ref 6–20)
CO2: 21 mmol/L — ABNORMAL LOW (ref 22–32)
Calcium: 9.9 mg/dL (ref 8.9–10.3)
Chloride: 104 mmol/L (ref 98–111)
Creatinine, Ser: 1.29 mg/dL — ABNORMAL HIGH (ref 0.44–1.00)
GFR, Estimated: 48 mL/min — ABNORMAL LOW (ref >60)
Glucose, Bld: 128 mg/dL — ABNORMAL HIGH (ref 70–99)
Potassium: 3.6 mmol/L (ref 3.5–5.1)
Sodium: 136 mmol/L (ref 135–145)

## 2022-09-03 MED ORDER — DIPHENHYDRAMINE HCL 25 MG PO CAPS
25.0000 mg | ORAL_CAPSULE | Freq: Four times a day (QID) | ORAL | Status: DC
  Administered 2022-09-03 – 2022-09-07 (×15): 25 mg via ORAL
  Filled 2022-09-03 (×15): qty 1

## 2022-09-03 NOTE — Progress Notes (Signed)
  Progress Note   Patient: Debbie [name]:[id number] DOB: 1965-05-20 DOA: 08/31/2022     3 DOS: the patient was seen and examined on 09/03/2022   Brief hospital course: 57 year old woman PMH including breast cancer with metastatic disease presented with jaundice.  Admitted for obstructive jaundice symptoms secondary to pancreatic mass, thought secondary to metastatic breast cancer.  Consultants GI  Procedures 4/23 ERCP  Assessment and Plan: Obstructive jaundice secondary to pancreatic mass Metastatic breast cancer with metastatic disease to bone, liver, lung Total bilirubin elevated.  Abdominal x-ray to identify location of stents. Continue to trend LFTs. May need ERCP tomorrow.   AKI Baseline creatinine 0.9 Renal ultrasound negative for hydronephrosis Creatinine improving, down to 1.29 today.  Continue IV fluids.  BMP in AM.   Essential hypertension Losartan, HCTZ, Lasix on hold due to AKI   Hypothyroidism Levothyroxine   Obesity Body mass index is 45.05 kg/m.   Hypokalemia Replaced      Subjective:  Feels ok, has some left jaw pain  Physical Exam: Vitals:   09/02/22 0542 09/02/22 1345 09/02/22 2200 09/03/22 1338  BP: 125/61 (!) 165/61 (!) 170/86 (!) 152/79  Pulse: 77 86 95 91  Resp: Temp: (!) 97.3 F (36.3 C) 97.7 F (36.5 C) 98 F (36.7 C) 97.6 F (36.4 C)  TempSrc: Oral Oral Oral Oral  SpO2: 95% 100% 95% 92%  Weight:      Height:       Physical Exam Vitals reviewed.  Constitutional:      General: She is not in acute distress.    Appearance: She is not ill-appearing or toxic-appearing.  Cardiovascular:     Rate and Rhythm: Normal rate and regular rhythm.     Heart sounds: No murmur heard. Pulmonary:     Effort: Pulmonary effort is normal. No respiratory distress.     Breath sounds: No wheezing, rhonchi or rales.  Neurological:     Mental Status: She is alert.  Psychiatric:        Mood and Affect: Mood normal.         Behavior: Behavior normal.     Data Reviewed: Creatinine down to 1.29 LFTs noted, tbili up to 13.3 Hgb stable 9.9  Family Communication: none  Disposition: Status is: Inpatient Remains inpatient appropriate because: elevated bilirubin  Planned Discharge Destination: Home    Time spent: 20 minutes  Author: Brendia Sacks, MD 09/03/2022 3:46 PM  For on call review [email or web address].

## 2022-09-03 NOTE — Anesthesia Preprocedure Evaluation (Addendum)
Anesthesia Evaluation  Patient identified by MRN, date of birth, ID band Patient awake    Reviewed: Allergy & Precautions, NPO status , Patient's Chart, lab work & pertinent test results  History of Anesthesia Complications (+) PONV and history of anesthetic complications  Airway Mallampati: II  TM Distance: >3 FB Neck ROM: Full    Dental no notable dental hx. (+) Dental Advisory Given, Edentulous Upper, Missing   Pulmonary neg pulmonary ROS   Pulmonary exam normal breath sounds clear to auscultation       Cardiovascular hypertension, Pt. on medications Normal cardiovascular exam Rhythm:Regular Rate:Normal     Neuro/Psych  Headaches    GI/Hepatic negative GI ROS,,,Lab Results      Component                Value               Date                      ALT                      270 (H)             09/03/2022                AST                      489 (H)             09/03/2022                GGT                      885 (H)             08/26/2022                ALKPHOS                  1,055 (H)           09/03/2022                BILITOT                  13.3 (H)            09/03/2022              Endo/Other  Hypothyroidism    Renal/GU Renal InsufficiencyRenal diseaseLab Results      Component                Value               Date                      CREATININE               1.29 (H)            09/03/2022                BUN                      28 (H)              09/03/2022                NA  136                 09/03/2022                K                        3.6                 09/03/2022                    Musculoskeletal  (+) Arthritis ,    Abdominal  (+) + obese (BMI 45.07)  Peds  Hematology  (+) Blood dyscrasia, anemia Lab Results      Component                Value               Date                      WBC                      8.4                 09/03/2022                 HGB                      9.9 (L)             09/03/2022                HCT                      30.6 (L)            09/03/2022                MCV                      93.0                09/03/2022                PLT                      324                 09/03/2022              Anesthesia Other Findings Metastatic breast CA with mets to liver, lung, bone    AlL: Ace inhibitors, atorvastatin simvastatin   Reproductive/Obstetrics                             Anesthesia Physical Anesthesia Plan  ASA: 3  Anesthesia Plan: General   Post-op Pain Management:    Induction: Intravenous  PONV Risk Score and Plan: 4 or greater and Dexamethasone, Ondansetron, Treatment may vary due to age or medical condition and Scopolamine patch - Pre-op  Airway Management Planned: Oral ETT  Additional Equipment:   Intra-op Plan:   Post-operative Plan: Extubation in OR  Informed Consent: I have reviewed the patients History and Physical, chart, labs and discussed the procedure including the risks, benefits and alternatives for the proposed anesthesia with the patient or authorized representative who has indicated his/her understanding  and acceptance.   Patient has DNR.   Dental advisory given  Plan Discussed with: CRNA and Anesthesiologist  Anesthesia Plan Comments:        Anesthesia Quick Evaluation

## 2022-09-03 NOTE — H&P (View-Only) (Signed)
   [facility] Progress Note  CC:  Hyperbilirubinemia, biliary obstruction, pancreatic mass  Subjective:   No nausea or vomiting.  No abdominal pain.  No bloody or black stools.  Has been nibbling on her breakfast this morning.   Objective:  Vital signs in last 24 hours: Temp:  [97.7 F (36.5 C)-98 F (36.7 C)] 98 F (36.7 C) (04/24 2200) Pulse Rate:  [86-95] 95 (04/24 2200) Resp:  [16-18] 18 (04/24 2200) BP: (165-170)/(61-86) 170/86 (04/24 2200) SpO2:  [95 %-100 %] 95 % (04/24 2200) Last BM Date : 08/31/22 General:  Alert, chronically ill-appearing, in NAD Heart:  Regular rate and rhythm; no murmurs Pulm:  CTAB.  No W/R/R. Abdomen:  Soft, protuberant, non-distended.  BS present.  Non-tender.   Extremities:  Without edema. Neurologic:  Alert and oriented x 4;  grossly normal neurologically. Psych:  Alert and cooperative. Normal mood and affect.  Lab Results: Recent Labs    09/01/22 0500 09/02/22 0531 09/03/22 0512  WBC 6.6 8.3 8.4  HGB 9.9* 9.5* 9.9*  HCT 31.0* 30.0* 30.6*  PLT 261 290 324   BMET Recent Labs    09/01/22 0500 09/02/22 0531 09/03/22 0512  NA 135 134* 136  K 3.3* 3.6 3.6  CL 96* 102 104  CO2 24 20* 21*  GLUCOSE 130* 134* 128*  BUN 37* 35* 28*  CREATININE 2.83* 1.69* 1.29*  CALCIUM 10.3 10.2 9.9   LFT Recent Labs    09/03/22 0512  PROT 6.8  ALBUMIN 2.5*  AST 489*  ALT 270*  ALKPHOS 1,055*  BILITOT 13.3*  BILIDIR 8.8*  IBILI 4.5*   PT/INR Recent Labs    09/01/22 0528  LABPROT 14.1  INR 1.1   DG ERCP  Result Date: 09/01/2022 CLINICAL DATA:  ERCP. EXAM: ERCP TECHNIQUE: Multiple spot images obtained with the fluoroscopic device and submitted for interpretation post-procedure. FLUOROSCOPY TIME: FLUOROSCOPY TIME 279.2 mGy COMPARISON:  MRCP-08/28/2022 FINDINGS: 5 spot intraoperative fluoroscopic images of the right upper abdominal quadrant during ERCP are provided for review. Initial image since demonstrates an ERCP  probe overlying the right upper abdominal quadrant. Subsequent images demonstrate selective cannulation and opacification of the pancreatic duct with subsequent cannulation and opacification of the common bile duct which appears moderately dilated. There is apparent malignant narrowing/subtotal occlusion involving the distal aspect of the CBD (best seen on image 3). There is minimal opacification of the intrahepatic biliary tree which appears mildly dilated. There is faint opacification of the cystic duct. Subsequent images demonstrate placement of a CBD stent as well as a suspected pancreatic stent, suboptimally evaluated due to patient motion. IMPRESSION: ERCP with placement of CBD and pancreatic duct stents as above. These images were submitted for radiologic interpretation only. Please see the procedural report for the amount of contrast and the fluoroscopy time utilized. Electronically Signed   By: John  Watts M.D.   On: 09/01/2022 14:53   DG C-Arm 1-60 Min-No Report  Result Date: 09/01/2022 Fluoroscopy was utilized by the requesting physician.  No radiographic interpretation.   US RENAL  Result Date: 09/01/2022 CLINICAL DATA:  Acute renal dysfunction EXAM: RENAL / URINARY TRACT ULTRASOUND COMPLETE COMPARISON:  08/27/2022 FINDINGS: Right Kidney: Renal measurements: 11.9 x 5 x 6.8 cm = volume: 210.9 mL. Echogenicity within normal limits. No mass or hydronephrosis visualized. Left Kidney: Renal measurements: 11.5 x 5.8 x 5.7 cm = volume: 199.8 mL. Increased cortical echogenicity maybe a technical artifact related to patient's body habitus. No mass or hydronephrosis visualized. Bladder: Appears   normal for degree of bladder distention. Other: Examination is technically limited due to patient's body habitus. IMPRESSION: No hydronephrosis. Electronically Signed   By: Palani  Rathinasamy M.D.   On: 09/01/2022 10:38    Assessment / Plan: 57-year-old female with metastatic breast cancer directly admitted to  the hospital 08/31/2022 secondary to hyperbilirubinemia, elevated LFTs concerning for biliary obstruction. Abdominal MRI/MRCP 08/28/2022 identified innumerable enhancing liver metastasis, a distended gallbladder, intra and extrahepatic biliary dilatation, central CBD sharply narrowed within the central pancreatic head and reconstituted above the ampulla with an obstructing mass to the head of the pancreas measuring 2.2 cm. Numerous enlarged retroperitoneal lymph nodes, numerous metastatic pulmonary nodules and widespread bone metastasis. ERCP 4/23 identified a 1 - 2 cm distal CBD stricture, s/p sphincterotomy and placement of biliary and pancreatic duct stents. LFTs mildly trending upward s/p ERCP.  T. Bili 7.2 -> 7.5. Alk phos 684 -> 777. AST 198 -> 271. ALT 157 -> 170. WBC normal.  LFTs up more today.  No N/V.  No abdominal pain.  Afebrile. Hemodynamically stable. -Continue to trend LFTs. -Await final read on abdominal x-ray from this AM but suspect that she will need repeat ERCP as it does not look like stent is functioning.  Will plan for this tentatively for tomorrow, 4/26. -Heart healthy diet, but NPO after midnight.   Normocytic anemia. No overt GI bleeding. Hg 11.1 -> 9.9 -> 9.5->9.9 grams today.   AKI:  Seems to be resolving.  Today Cr 1.29.    LOS: 3 days   Jessica D. Zehr  09/03/2022, 9:11 AM     [facility] Attending   I have taken an interval history, reviewed the chart and examined the patient. I agree with the Advanced Practitioner's note, impression and recommendations.    Xray shows the stent but it is not accurate due to obesity - given the situation I believe repeat ERCP is appropriate.   Eimi Viney E. Harbor Vanover, MD, FACG [facility] See AMION on call - gastroenterology for best contact person 09/03/2022 5:22 PM   

## 2022-09-03 NOTE — Progress Notes (Addendum)
[facility] Progress Note  CC:  Hyperbilirubinemia, biliary obstruction, pancreatic mass  Subjective:   No nausea or vomiting.  No abdominal pain.  No bloody or black stools.  Has been nibbling on her breakfast this morning.   Objective:  Vital signs in last 24 hours: Temp:  [97.7 F (36.5 C)-98 F (36.7 C)] 98 F (36.7 C) (04/24 2200) Pulse Rate:  [86-95] 95 (04/24 2200) Resp:  [16-18] 18 (04/24 2200) BP: (165-170)/(61-86) 170/86 (04/24 2200) SpO2:  [95 %-100 %] 95 % (04/24 2200) Last BM Date : 08/31/22 General:  Alert, chronically ill-appearing, in NAD Heart:  Regular rate and rhythm; no murmurs Pulm:  CTAB.  No W/R/R. Abdomen:  Soft, protuberant, non-distended.  BS present.  Non-tender.   Extremities:  Without edema. Neurologic:  Alert and oriented x 4;  grossly normal neurologically. Psych:  Alert and cooperative. Normal mood and affect.  Lab Results: Recent Labs    09/01/22 0500 09/02/22 0531 09/03/22 0512  WBC 6.6 8.3 8.4  HGB 9.9* 9.5* 9.9*  HCT 31.0* 30.0* 30.6*  PLT 261 290 324   BMET Recent Labs    09/01/22 0500 09/02/22 0531 09/03/22 0512  NA 135 134* 136  K 3.3* 3.6 3.6  CL 96* 102 104  CO2 24 20* 21*  GLUCOSE 130* 134* 128*  BUN 37* 35* 28*  CREATININE 2.83* 1.69* 1.29*  CALCIUM 10.3 10.2 9.9   LFT Recent Labs    09/03/22 0512  PROT 6.8  ALBUMIN 2.5*  AST 489*  ALT 270*  ALKPHOS 1,055*  BILITOT 13.3*  BILIDIR 8.8*  IBILI 4.5*   PT/INR Recent Labs    09/01/22 0528  LABPROT 14.1  INR 1.1   DG ERCP  Result Date: 09/01/2022 CLINICAL DATA:  ERCP. EXAM: ERCP TECHNIQUE: Multiple spot images obtained with the fluoroscopic device and submitted for interpretation post-procedure. FLUOROSCOPY TIME: FLUOROSCOPY TIME 279.2 mGy COMPARISON:  MRCP-08/28/2022 FINDINGS: 5 spot intraoperative fluoroscopic images of the right upper abdominal quadrant during ERCP are provided for review. Initial image since demonstrates an ERCP  probe overlying the right upper abdominal quadrant. Subsequent images demonstrate selective cannulation and opacification of the pancreatic duct with subsequent cannulation and opacification of the common bile duct which appears moderately dilated. There is apparent malignant narrowing/subtotal occlusion involving the distal aspect of the CBD (best seen on image 3). There is minimal opacification of the intrahepatic biliary tree which appears mildly dilated. There is faint opacification of the cystic duct. Subsequent images demonstrate placement of a CBD stent as well as a suspected pancreatic stent, suboptimally evaluated due to patient motion. IMPRESSION: ERCP with placement of CBD and pancreatic duct stents as above. These images were submitted for radiologic interpretation only. Please see the procedural report for the amount of contrast and the fluoroscopy time utilized. Electronically Signed   By: Simonne Come M.D.   On: 09/01/2022 14:53   DG C-Arm 1-60 Min-No Report  Result Date: 09/01/2022 Fluoroscopy was utilized by the requesting physician.  No radiographic interpretation.   US RENAL  Result Date: 09/01/2022 CLINICAL DATA:  Acute renal dysfunction EXAM: RENAL / URINARY TRACT ULTRASOUND COMPLETE COMPARISON:  08/27/2022 FINDINGS: Right Kidney: Renal measurements: 11.9 x 5 x 6.8 cm = volume: 210.9 mL. Echogenicity within normal limits. No mass or hydronephrosis visualized. Left Kidney: Renal measurements: 11.5 x 5.8 x 5.7 cm = volume: 199.8 mL. Increased cortical echogenicity maybe a technical artifact related to patient's body habitus. No mass or hydronephrosis visualized. Bladder: Appears  normal for degree of bladder distention. Other: Examination is technically limited due to patient's body habitus. IMPRESSION: No hydronephrosis. Electronically Signed   By: Ernie Avena M.D.   On: 09/01/2022 10:38    Assessment / Plan: 57 year old female with metastatic breast cancer directly admitted to  the hospital 08/31/2022 secondary to hyperbilirubinemia, elevated LFTs concerning for biliary obstruction. Abdominal MRI/MRCP 08/28/2022 identified innumerable enhancing liver metastasis, a distended gallbladder, intra and extrahepatic biliary dilatation, central CBD sharply narrowed within the central pancreatic head and reconstituted above the ampulla with an obstructing mass to the head of the pancreas measuring 2.2 cm. Numerous enlarged retroperitoneal lymph nodes, numerous metastatic pulmonary nodules and widespread bone metastasis. ERCP 4/23 identified a 1 - 2 cm distal CBD stricture, s/p sphincterotomy and placement of biliary and pancreatic duct stents. LFTs mildly trending upward s/p ERCP.  T. Bili 7.2 -> 7.5. Alk phos 684 -> 777. AST 198 -> 271. ALT 157 -> 170. WBC normal.  LFTs up more today.  No N/V.  No abdominal pain.  Afebrile. Hemodynamically stable. -Continue to trend LFTs. -Await final read on abdominal x-ray from this AM but suspect that she will need repeat ERCP as it does not look like stent is functioning.  Will plan for this tentatively for tomorrow, 4/26. -Heart healthy diet, but NPO after midnight.   Normocytic anemia. No overt GI bleeding. Hg 11.1 -> 9.9 -> 9.5->9.9 grams today.   AKI:  Seems to be resolving.  Today Cr 1.29.    LOS: 3 days   Princella Pellegrini. Zehr  09/03/2022, 9:11 AM     [facility] Attending   I have taken an interval history, reviewed the chart and examined the patient. I agree with the Advanced Practitioner's note, impression and recommendations.    Xray shows the stent but it is not accurate due to obesity - given the situation I believe repeat ERCP is appropriate.   Iva Boop, MD, [facility] See Loretha Stapler on call - gastroenterology for best contact person 09/03/2022 5:22 PM

## 2022-09-04 ENCOUNTER — Inpatient Hospital Stay (HOSPITAL_COMMUNITY): Payer: Commercial Managed Care - PPO

## 2022-09-04 ENCOUNTER — Encounter (HOSPITAL_COMMUNITY): Payer: Self-pay | Admitting: Internal Medicine

## 2022-09-04 ENCOUNTER — Encounter (HOSPITAL_COMMUNITY)
Admission: AD | Disposition: A | Discharge status: Hospice | Payer: Self-pay | Source: Ambulatory Visit | Attending: Family Medicine

## 2022-09-04 ENCOUNTER — Inpatient Hospital Stay (HOSPITAL_COMMUNITY): Payer: Commercial Managed Care - PPO | Admitting: Anesthesiology

## 2022-09-04 DIAGNOSIS — K831 Obstruction of bile duct: Secondary | ICD-10-CM | POA: Diagnosis not present

## 2022-09-04 DIAGNOSIS — Z4659 Encounter for fitting and adjustment of other gastrointestinal appliance and device: Secondary | ICD-10-CM

## 2022-09-04 DIAGNOSIS — E039 Hypothyroidism, unspecified: Secondary | ICD-10-CM

## 2022-09-04 DIAGNOSIS — I10 Essential (primary) hypertension: Secondary | ICD-10-CM

## 2022-09-04 DIAGNOSIS — C50919 Malignant neoplasm of unspecified site of unspecified female breast: Secondary | ICD-10-CM | POA: Diagnosis not present

## 2022-09-04 DIAGNOSIS — D638 Anemia in other chronic diseases classified elsewhere: Secondary | ICD-10-CM

## 2022-09-04 DIAGNOSIS — N179 Acute kidney failure, unspecified: Secondary | ICD-10-CM | POA: Diagnosis not present

## 2022-09-04 HISTORY — PX: BILIARY BRUSHING: SHX6843

## 2022-09-04 HISTORY — PX: BILIARY STENT PLACEMENT: SHX5538

## 2022-09-04 HISTORY — PX: STENT REMOVAL: SHX6421

## 2022-09-04 HISTORY — PX: ENDOSCOPIC RETROGRADE CHOLANGIOPANCREATOGRAPHY (ERCP) WITH PROPOFOL: SHX5810

## 2022-09-04 LAB — HEPATIC FUNCTION PANEL
ALT: 369 U/L — ABNORMAL HIGH (ref 0–44)
AST: 754 U/L — ABNORMAL HIGH (ref 15–41)
Albumin: 2.3 g/dL — ABNORMAL LOW (ref 3.5–5.0)
Alkaline Phosphatase: 1622 U/L — ABNORMAL HIGH (ref 38–126)
Bilirubin, Direct: 9.3 mg/dL — ABNORMAL HIGH (ref 0.0–0.2)
Indirect Bilirubin: 4.7 mg/dL — ABNORMAL HIGH (ref 0.3–0.9)
Total Bilirubin: 14 mg/dL — ABNORMAL HIGH (ref 0.3–1.2)
Total Protein: 6.5 g/dL (ref 6.5–8.1)

## 2022-09-04 LAB — GLUCOSE, CAPILLARY: Glucose-Capillary: 79 mg/dL (ref 70–99)

## 2022-09-04 LAB — BASIC METABOLIC PANEL
Anion gap: 10 (ref 5–15)
BUN: 30 mg/dL — ABNORMAL HIGH (ref 6–20)
CO2: 23 mmol/L (ref 22–32)
Calcium: 10.3 mg/dL (ref 8.9–10.3)
Chloride: 104 mmol/L (ref 98–111)
Creatinine, Ser: 1.93 mg/dL — ABNORMAL HIGH (ref 0.44–1.00)
GFR, Estimated: 30 mL/min — ABNORMAL LOW (ref >60)
Glucose, Bld: 87 mg/dL (ref 70–99)
Potassium: 4 mmol/L (ref 3.5–5.1)
Sodium: 137 mmol/L (ref 135–145)

## 2022-09-04 SURGERY — ENDOSCOPIC RETROGRADE CHOLANGIOPANCREATOGRAPHY (ERCP) WITH PROPOFOL
Anesthesia: General

## 2022-09-04 MED ORDER — SODIUM CHLORIDE 0.9 % IV SOLN
INTRAVENOUS | Status: DC | PRN
  Administered 2022-09-04: 24 mL

## 2022-09-04 MED ORDER — SODIUM CHLORIDE 0.9 % IV SOLN
1.5000 g | Freq: Once | INTRAVENOUS | Status: AC
  Administered 2022-09-04: 1.5 g via INTRAVENOUS
  Filled 2022-09-04: qty 4

## 2022-09-04 MED ORDER — SUGAMMADEX SODIUM 200 MG/2ML IV SOLN
INTRAVENOUS | Status: DC | PRN
  Administered 2022-09-04: 450 mg via INTRAVENOUS

## 2022-09-04 MED ORDER — PHENYLEPHRINE 80 MCG/ML (10ML) SYRINGE FOR IV PUSH (FOR BLOOD PRESSURE SUPPORT)
PREFILLED_SYRINGE | INTRAVENOUS | Status: DC | PRN
  Administered 2022-09-04: 80 ug via INTRAVENOUS
  Administered 2022-09-04: 160 ug via INTRAVENOUS
  Administered 2022-09-04: 80 ug via INTRAVENOUS
  Administered 2022-09-04 (×3): 160 ug via INTRAVENOUS

## 2022-09-04 MED ORDER — ROCURONIUM BROMIDE 10 MG/ML (PF) SYRINGE
PREFILLED_SYRINGE | INTRAVENOUS | Status: DC | PRN
  Administered 2022-09-04: 80 mg via INTRAVENOUS
  Administered 2022-09-04: 20 mg via INTRAVENOUS

## 2022-09-04 MED ORDER — DICLOFENAC SUPPOSITORY 100 MG
RECTAL | Status: DC | PRN
  Administered 2022-09-04: 100 mg via RECTAL

## 2022-09-04 MED ORDER — LIDOCAINE 2% (20 MG/ML) 5 ML SYRINGE
INTRAMUSCULAR | Status: DC | PRN
  Administered 2022-09-04: 80 mg via INTRAVENOUS

## 2022-09-04 MED ORDER — FENTANYL CITRATE (PF) 100 MCG/2ML IJ SOLN
INTRAMUSCULAR | Status: AC
  Filled 2022-09-04: qty 2

## 2022-09-04 MED ORDER — EPHEDRINE SULFATE-NACL 50-0.9 MG/10ML-% IV SOSY
PREFILLED_SYRINGE | INTRAVENOUS | Status: DC | PRN
  Administered 2022-09-04: 5 mg via INTRAVENOUS

## 2022-09-04 MED ORDER — PROPOFOL 10 MG/ML IV BOLUS
INTRAVENOUS | Status: DC | PRN
  Administered 2022-09-04: 150 mg via INTRAVENOUS

## 2022-09-04 MED ORDER — LACTATED RINGERS IV SOLN: INTRAVENOUS | Status: DC

## 2022-09-04 MED ORDER — GLUCAGON HCL RDNA (DIAGNOSTIC) 1 MG IJ SOLR
INTRAMUSCULAR | Status: AC
  Filled 2022-09-04: qty 2

## 2022-09-04 MED ORDER — MIDAZOLAM HCL 2 MG/2ML IJ SOLN
INTRAMUSCULAR | Status: AC
  Filled 2022-09-04: qty 2

## 2022-09-04 MED ORDER — DEXAMETHASONE SODIUM PHOSPHATE 10 MG/ML IJ SOLN
INTRAMUSCULAR | Status: DC | PRN
  Administered 2022-09-04: 10 mg via INTRAVENOUS

## 2022-09-04 MED ORDER — DICLOFENAC SUPPOSITORY 100 MG
RECTAL | Status: AC
  Filled 2022-09-04: qty 1

## 2022-09-04 MED ORDER — ONDANSETRON HCL 4 MG/2ML IJ SOLN
INTRAMUSCULAR | Status: DC | PRN
  Administered 2022-09-04: 4 mg via INTRAVENOUS

## 2022-09-04 MED ORDER — SCOPOLAMINE 1 MG/3DAYS TD PT72
MEDICATED_PATCH | TRANSDERMAL | Status: AC
  Filled 2022-09-04: qty 1

## 2022-09-04 MED ORDER — PHENYLEPHRINE HCL-NACL 20-0.9 MG/250ML-% IV SOLN
INTRAVENOUS | Status: DC | PRN
  Administered 2022-09-04: 55 ug/min via INTRAVENOUS

## 2022-09-04 MED ORDER — FENTANYL CITRATE (PF) 100 MCG/2ML IJ SOLN
INTRAMUSCULAR | Status: DC | PRN
  Administered 2022-09-04: 50 ug via INTRAVENOUS

## 2022-09-04 MED ORDER — SODIUM CHLORIDE 0.9 % IV SOLN: INTRAVENOUS | Status: DC

## 2022-09-04 MED ORDER — SCOPOLAMINE 1 MG/3DAYS TD PT72
1.0000 | MEDICATED_PATCH | TRANSDERMAL | Status: DC
  Administered 2022-09-04: 1.5 mg via TRANSDERMAL

## 2022-09-04 NOTE — Transfer of Care (Signed)
Immediate Anesthesia Transfer of Care Note  Patient: Debbie [name]  Procedure(s) Performed: Procedure(s): ENDOSCOPIC RETROGRADE CHOLANGIOPANCREATOGRAPHY (ERCP) WITH PROPOFOL (N/A) STENT REMOVAL BILIARY BRUSHING BILIARY STENT PLACEMENT (N/A)  Patient Location: PACU  Anesthesia Type:General  Level of Consciousness:  sedated, patient cooperative and responds to stimulation  Airway & Oxygen Therapy:Patient Spontanous Breathing and Patient connected to face mask oxgen  Post-op Assessment:  Report given to PACU RN and Post -op Vital signs reviewed and stable  Post vital signs:  Reviewed and stable  Last Vitals:  Vitals:   09/04/22 0422 09/04/22 1209  BP: 118/67 129/75  Pulse: 93 99  Resp: 18 20  Temp: (!) 36.4 C 36.6 C  SpO2: 95% 95%    Complications: No apparent anesthesia complications

## 2022-09-04 NOTE — Op Note (Signed)
[facility] Patient Name: Debbie [name] Procedure Date: 09/04/2022 MRN: [id number] Attending MD: Iva Boop , MD, [id number] Date of Birth: 07/27/1965 CSN: [id number] Age: 57 Admit Type: Inpatient Procedure:                ERCP Indications:              Jaundice Providers:                Iva Boop, MD, Fransisca Connors, Leanne Lovely, Technician, Greig Right, CRNA Referring MD:             Serena Croissant Medicines:                General Anesthesia, Unasyn 1.5 g IV, diclofenac 100                            mg per rectum Complications:            No immediate complications. Estimated Blood Loss:     Estimated blood loss: none. Procedure:                Pre-Anesthesia Assessment:                           - Prior to the procedure, a History and Physical                            was performed, and patient medications and                            allergies were reviewed. The patient's tolerance of                            previous anesthesia was also reviewed. The risks                            and benefits of the procedure and the sedation                            options and risks were discussed with the patient.                            All questions were answered, and informed consent                            was obtained. Prior Anticoagulants: The patient has                            taken no anticoagulant or antiplatelet agents. ASA                            Grade Assessment: III - A patient with severe  systemic disease. After reviewing the risks and                            benefits, the patient was deemed in satisfactory                            condition to undergo the procedure.                           After obtaining informed consent, the scope was                            passed under direct vision. Throughout the                            procedure, the patient's  blood pressure, pulse, and                            oxygen saturations were monitored continuously. The                            [employer] D single use                            duodenoscope was introduced through the mouth, and                            used to inject contrast into and used to cannulate                            the bile duct. The ERCP was accomplished without                            difficulty. The patient tolerated the procedure                            well. Scope In: Scope Out: Findings:      The scout film was normal. The esophagus was successfully intubated       under semi-blind vision. The scope was advanced to a normal major       papilla in the descending duodenum without detailed examination of the       pharynx, larynx and associated structures, and upper GI tract. The upper       GI tract was grossly normal, esophagus not seen well. the major papilla       had a pancreatic stent and biliary stent in place. The 10 Fr 7 cm stent       that failed to deploy earlier in the week was in the duodenum distal to       the papilla. the existing 8.5 Fr 5 cm stent was draining some bile and       sludge. I passed a wire through and also injected contrast confirming       patency.. No stones. I removed the stent from the duodenum and then       removed the indwelling  biliary stent, using a snare. I then injected       contrast again into CBD. The dital CBD stricture was seen - above a very       short area of normal caliber duct. The biliary tree was decompressed.       The gallbladder filled. No obvious bile duct stones, cannt comment on       gallstones given amount of contrast. I then took cytology brushings x 2       over a wire and then placed a 5 cm 10 fr plastic biliary stent and there       was excellent flow and drainage of contrast. then I removed the       indwelling pancreatic stent (4 fr 3 cm pigtail). Impression:                - A single localized biliary stricture was found in                            the common bile duct. The stricture was malignant                            appearing. This was aspirated and the fluid was                            sent for cytology. This stricture was treated with                            stent placement. See above but I removed 8.5 fr 5                            cm stent and replaced w/ 10 fr 5 cm stent. Moderate Sedation:      Not Applicable - Patient had care per Anesthesia. Recommendation:           - F/U LFT's                           I have explained to husband that I think the                            hepatic metastases are the main issue since                            existing and replacement stents are functioning.                           I will message Dr. Pamelia Hoit                           Clears tonight                           i will f/u Procedure Code(s):        --- Professional ---                           [phone], Endoscopic retrograde  cholangiopancreatography (ERCP); with removal and                            exchange of stent(s), biliary or pancreatic duct,                            including pre- and post-dilation and guide wire                            passage, when performed, including sphincterotomy,                            when performed, each stent exchanged Diagnosis Code(s):        --- Professional ---                           K83.1, Obstruction of bile duct                           R17, Unspecified jaundice CPT copyright 2022 [employer] Association. All rights reserved. The codes documented in this report are preliminary and upon coder review may  be revised to meet current compliance requirements. Iva Boop, MD 09/04/2022 2:35:05 PM This report has been signed electronically. Number of Addenda: 0

## 2022-09-04 NOTE — Progress Notes (Signed)
  Progress Note   Patient: Debbie [name]:[id number] DOB: 1966-02-06 DOA: 08/31/2022     4 DOS: the patient was seen and examined on 09/04/2022   Brief hospital course: 57 year old woman PMH including breast cancer with metastatic disease presented with jaundice.  Admitted for obstructive jaundice symptoms secondary to pancreatic mass, thought secondary to metastatic breast cancer.  Consultants GI  Procedures 4/23 ERCP 4/26 ERCP   Assessment and Plan: Obstructive jaundice secondary to pancreatic mass Metastatic breast cancer with metastatic disease to bone, liver, lung LFTs continue to trend up.  Repeat ERCP today. Continue to trend LFTs.   AKI Baseline creatinine 0.9 Renal ultrasound negative for hydronephrosis Creatinine improved significantly only to rebound to 1.93 today.  IV fluids, check BMP in AM.   Essential hypertension Losartan, HCTZ, Lasix remained on hold due to AKI   Hypothyroidism Continue levothyroxine   Obesity Body mass index is 45.05 kg/m.   Hypokalemia Replaced     Subjective:  Resting   Physical Exam: Vitals:   09/04/22 1430 09/04/22 1440 09/04/22 1450 09/04/22 1552  BP: (!) 106/47 97/77 (!) 114/55 107/60  Pulse: 93 93 98 87  Resp: 15 14 18 18   Temp:    98.3 F (36.8 C)  TempSrc:    Oral  SpO2: 100% 100% 97% 100%  Weight:      Height:       Physical Exam Vitals reviewed.  Constitutional:      General: She is not in acute distress.    Appearance: She is not ill-appearing or toxic-appearing.  Cardiovascular:     Rate and Rhythm: Normal rate and regular rhythm.     Heart sounds: No murmur heard. Pulmonary:     Effort: Pulmonary effort is normal. No respiratory distress.     Breath sounds: No wheezing, rhonchi or rales.  Neurological:     Mental Status: She is alert.  Psychiatric:        Mood and Affect: Mood normal.        Behavior: Behavior normal.     Data Reviewed: CBG stable Creatinine up to 1.93 AP up to  1622 AST and ALT trending up Tbili up to 14  Family Communication: father at bedside  Disposition: Status is: Inpatient Remains inpatient appropriate because: elevated LFTs  Planned Discharge Destination: Home    Time spent: 25 minutes  Author: Brendia Sacks, MD 09/04/2022 4:59 PM  For on call review [email or web address].

## 2022-09-04 NOTE — Interval H&P Note (Signed)
History and Physical Interval Note:  09/04/2022 1:05 PM  Debbie [name]  has presented today for surgery, with the diagnosis of Biliary obstruction.  The various methods of treatment have been discussed with the patient and family. After consideration of risks, benefits and other options for treatment, the patient has consented to  Procedure(s): ENDOSCOPIC RETROGRADE CHOLANGIOPANCREATOGRAPHY (ERCP) WITH PROPOFOL (N/A) as a surgical intervention.  The patient's history has been reviewed, patient examined, no change in status, stable for surgery.  I have reviewed the patient's chart and labs.  Questions were answered to the patient's satisfaction.     Stan Head

## 2022-09-04 NOTE — Anesthesia Procedure Notes (Signed)
Procedure Name: Intubation Date/Time: 09/04/2022 2:27 PM  Performed by: Theodosia Quay, CRNAPre-anesthesia Checklist: Patient identified, Emergency Drugs available, Suction available, Patient being monitored and Timeout performed Patient Re-evaluated:Patient Re-evaluated prior to induction Oxygen Delivery Method: Circle system utilized Preoxygenation: Pre-oxygenation with 100% oxygen Induction Type: IV induction Ventilation: Mask ventilation without difficulty Laryngoscope Size: Mac and 4 Grade View: Grade I Tube type: Oral Tube size: 7.5 mm Number of attempts: 1 Airway Equipment and Method: Stylet Placement Confirmation: ETT inserted through vocal cords under direct vision, positive ETCO2, CO2 detector and breath sounds checked- equal and bilateral Secured at: 22 cm Tube secured with: Tape Dental Injury: Teeth and Oropharynx as per pre-operative assessment

## 2022-09-05 DIAGNOSIS — N179 Acute kidney failure, unspecified: Secondary | ICD-10-CM | POA: Diagnosis not present

## 2022-09-05 DIAGNOSIS — K831 Obstruction of bile duct: Secondary | ICD-10-CM | POA: Diagnosis not present

## 2022-09-05 DIAGNOSIS — C50919 Malignant neoplasm of unspecified site of unspecified female breast: Secondary | ICD-10-CM | POA: Diagnosis not present

## 2022-09-05 DIAGNOSIS — C801 Malignant (primary) neoplasm, unspecified: Secondary | ICD-10-CM | POA: Diagnosis not present

## 2022-09-05 LAB — BASIC METABOLIC PANEL
Anion gap: 12 (ref 5–15)
BUN: 35 mg/dL — ABNORMAL HIGH (ref 6–20)
CO2: 20 mmol/L — ABNORMAL LOW (ref 22–32)
Calcium: 10.8 mg/dL — ABNORMAL HIGH (ref 8.9–10.3)
Chloride: 105 mmol/L (ref 98–111)
Creatinine, Ser: 1.44 mg/dL — ABNORMAL HIGH (ref 0.44–1.00)
GFR, Estimated: 42 mL/min — ABNORMAL LOW (ref >60)
Glucose, Bld: 109 mg/dL — ABNORMAL HIGH (ref 70–99)
Potassium: 3.9 mmol/L (ref 3.5–5.1)
Sodium: 137 mmol/L (ref 135–145)

## 2022-09-05 LAB — HEPATIC FUNCTION PANEL
ALT: 438 U/L — ABNORMAL HIGH (ref 0–44)
AST: 863 U/L — ABNORMAL HIGH (ref 15–41)
Albumin: 2.1 g/dL — ABNORMAL LOW (ref 3.5–5.0)
Alkaline Phosphatase: 2458 U/L — ABNORMAL HIGH (ref 38–126)
Bilirubin, Direct: 9.6 mg/dL — ABNORMAL HIGH (ref 0.0–0.2)
Indirect Bilirubin: 4.9 mg/dL — ABNORMAL HIGH (ref 0.3–0.9)
Total Bilirubin: 14.5 mg/dL — ABNORMAL HIGH (ref 0.3–1.2)
Total Protein: 6.5 g/dL (ref 6.5–8.1)

## 2022-09-05 MED ORDER — CHOLESTYRAMINE LIGHT 4 G PO PACK
4.0000 g | PACK | Freq: Two times a day (BID) | ORAL | Status: DC
Start: 2022-09-05 — End: 2022-09-07
  Administered 2022-09-05 (×2): 4 g via ORAL
  Filled 2022-09-05 (×6): qty 1

## 2022-09-05 NOTE — Progress Notes (Signed)
  Progress Note   Patient: Debbie [name] ZOX:[id number] DOB: September 06, 1965 DOA: 08/31/2022     5 DOS: the patient was seen and examined on 09/05/2022   Brief hospital course: 57 year old woman PMH including breast cancer with metastatic disease presented with jaundice.  Admitted for obstructive jaundice symptoms secondary to pancreatic mass, thought secondary to metastatic breast cancer.  Consultants GI  Procedures 4/23 ERCP 4/26 ERCP  Assessment and Plan: Obstructive jaundice secondary to pancreatic mass Metastatic breast cancer with metastatic disease to bone, liver, lung LFTs continue to trend up despite repeat ERCP 4/26. Discussed with Dr. Leone Payor. Concern for malignancy as etiology. Dr. Pamelia Hoit will see 4/28 Continue to trend LFTs. Cholestyramine for pruritus    AKI Baseline creatinine 0.9 Renal ultrasound negative for hydronephrosis Creatinine improved significantly only to rebound to 1.93, now down to 1.44. Continue IV fluids, check BMP in AM.  Hypercalcemia of malignancy Confer w/ Dr. Pamelia Hoit 4/28. BMP in AM.   Essential hypertension Losartan, HCTZ, Lasix remained on hold due to AKI   Hypothyroidism Continue levothyroxine   Obesity Body mass index is 45.05 kg/m.   Hypokalemia Replaced      Subjective:  Feels ok Main problem is itching  Physical Exam: Vitals:   09/04/22 1440 09/04/22 1450 09/04/22 1552 09/05/22 0534  BP: 97/77 (!) 114/55 107/60 111/63  Pulse: 93 98 87 79  Resp: 14 18 18 18   Temp:   98.3 F (36.8 C) 97.6 F (36.4 C)  TempSrc:   Oral Oral  SpO2: 100% 97% 100% 95%  Weight:      Height:       Physical Exam Vitals reviewed.  Constitutional:      General: She is not in acute distress.    Appearance: She is not ill-appearing or toxic-appearing.  Cardiovascular:     Rate and Rhythm: Normal rate and regular rhythm.     Heart sounds: No murmur heard. Pulmonary:     Effort: Pulmonary effort is normal. No respiratory distress.      Breath sounds: No wheezing, rhonchi or rales.  Neurological:     Mental Status: She is alert.  Psychiatric:        Mood and Affect: Mood normal.        Behavior: Behavior normal.     Data Reviewed: Creatine 1.93 > 1.44 Ca2+ 10.8 AP up to 2458 AST, ALT trending up Tbil trending up, 14.0 > 14.5  Family Communication: husband at bedside  Disposition: Status is: Inpatient Remains inpatient appropriate because: rising LFTs  Planned Discharge Destination: Home    Time spent: 20 minutes  Author: Brendia Sacks, MD 09/05/2022 8:47 AM  For on call review [email or web address].

## 2022-09-05 NOTE — Progress Notes (Addendum)
   Patient Name: Debbie [name] Date of Encounter: 09/05/2022, 12:00 PM     Assessment and Plan  Jaundice in setting of metastatic breast cancer (liver + pancreas) and associated pruritus  Blie duct stent is functioning (and replaced w/ larger) and treating stricture of CBD  I think hepatic metastases main issue re: jaundice  ------------------------------------------------------------------------------------------------------------------  I will f/u CBD cytology  I have ordered cholestyramine 4 g bid to see if that helps w/ pruritus - if that fails rifampin may be next.  Naltrexone not an option on opioids.  Signing off at this time  Dr. Pamelia Hoit hopse to see her tomorrow  Iva Boop, MD, [facility] See AMION on call - gastroenterology for best contact person 09/05/2022 12:10 PM     Subjective  Pruritus is still a problem No abd pain   Objective  BP 111/63 (BP Location: Left Arm)   Pulse 79   Temp 97.6 F (36.4 C) (Oral)   Resp 18   Ht 5\' 6"  (1.676 m)   Wt 126.6 kg   LMP 12/15/2015   SpO2 95%   BMI 45.05 kg/m   Abd obese and nontender Recent Labs  Lab 09/01/22 0500 09/01/22 0528 09/02/22 0531 09/03/22 0512 09/04/22 0529 09/05/22 0554  AST 198*  --  271* 489* 754* 863*  ALT 157*  --  170* 270* 369* 438*  ALKPHOS 684*  --  777* 1,055* 1,622* 2,458*  BILITOT 11.3*  --  11.2* 13.3* 14.0* 14.5*  PROT 6.9  --  7.3 6.8 6.5 6.5  ALBUMIN 2.4*  --  2.6* 2.5* 2.3* 2.1*  INR  --  1.1  --   --   --   --      Iva Boop, MD, [facility] See AMION on call - gastroenterology for best contact person 09/05/2022 12:00 PM

## 2022-09-06 ENCOUNTER — Inpatient Hospital Stay (HOSPITAL_COMMUNITY): Payer: Commercial Managed Care - PPO

## 2022-09-06 DIAGNOSIS — N179 Acute kidney failure, unspecified: Secondary | ICD-10-CM | POA: Diagnosis not present

## 2022-09-06 DIAGNOSIS — K831 Obstruction of bile duct: Secondary | ICD-10-CM | POA: Diagnosis not present

## 2022-09-06 DIAGNOSIS — K8689 Other specified diseases of pancreas: Secondary | ICD-10-CM | POA: Diagnosis not present

## 2022-09-06 DIAGNOSIS — C50919 Malignant neoplasm of unspecified site of unspecified female breast: Secondary | ICD-10-CM | POA: Diagnosis not present

## 2022-09-06 DIAGNOSIS — C801 Malignant (primary) neoplasm, unspecified: Secondary | ICD-10-CM | POA: Diagnosis not present

## 2022-09-06 LAB — HEPATIC FUNCTION PANEL
ALT: 464 U/L — ABNORMAL HIGH (ref 0–44)
AST: 956 U/L — ABNORMAL HIGH (ref 15–41)
Albumin: 2.4 g/dL — ABNORMAL LOW (ref 3.5–5.0)
Alkaline Phosphatase: 2957 U/L — ABNORMAL HIGH (ref 38–126)
Bilirubin, Direct: 12 mg/dL — ABNORMAL HIGH (ref 0.0–0.2)
Indirect Bilirubin: 5.3 mg/dL — ABNORMAL HIGH (ref 0.3–0.9)
Total Bilirubin: 17.3 mg/dL — ABNORMAL HIGH (ref 0.3–1.2)
Total Protein: 6.7 g/dL (ref 6.5–8.1)

## 2022-09-06 LAB — BASIC METABOLIC PANEL
Anion gap: 13 (ref 5–15)
BUN: 43 mg/dL — ABNORMAL HIGH (ref 6–20)
CO2: 20 mmol/L — ABNORMAL LOW (ref 22–32)
Calcium: 10.7 mg/dL — ABNORMAL HIGH (ref 8.9–10.3)
Chloride: 103 mmol/L (ref 98–111)
Creatinine, Ser: 1.46 mg/dL — ABNORMAL HIGH (ref 0.44–1.00)
GFR, Estimated: 41 mL/min — ABNORMAL LOW (ref >60)
Glucose, Bld: 101 mg/dL — ABNORMAL HIGH (ref 70–99)
Potassium: 3.9 mmol/L (ref 3.5–5.1)
Sodium: 136 mmol/L (ref 135–145)

## 2022-09-06 NOTE — Progress Notes (Signed)
  Progress Note   Patient: Debbie [name] ZOX:[id number] DOB: 01/26/1966 DOA: 08/31/2022     6 DOS: the patient was seen and examined on 09/06/2022   Brief hospital course: 57 year old woman PMH including breast cancer with metastatic disease presented with jaundice.  Admitted for obstructive jaundice symptoms secondary to pancreatic mass, thought secondary to metastatic breast cancer.  Consultants GI  Procedures 4/23 ERCP 4/26 ERCP  Assessment and Plan: Obstructive jaundice secondary to pancreatic mass Metastatic breast cancer with metastatic disease to bone, liver, lung LFTs continue to trend up despite repeat ERCP 4/26. Discussed with Drs. Leone Payor and [place]. Concern for malignancy as etiology. Dr. Pamelia Hoit has recommended home with hospice.  No further systemic treatments. Cholestyramine for pruritus  Patient plans for home with hospice in the next 48 hours.   AKI Baseline creatinine 0.9 Renal ultrasound negative for hydronephrosis Creatinine improved significantly only to rebound to 1.93, now down to 1.44.  Will give some more IV fluids.   Hypercalcemia of malignancy Confer w/ Dr. Pamelia Hoit 4/28. BMP in AM.   Essential hypertension Losartan, HCTZ, Lasix remained on hold due to AKI   Hypothyroidism Continue levothyroxine   Obesity Body mass index is 45.05 kg/m.   Hypokalemia Replaced      Subjective:  Feels ok  Physical Exam: Vitals:   09/05/22 0534 09/05/22 1348 09/05/22 2002 09/06/22 0630  BP: 111/63 120/66 114/70 (!) 140/74  Pulse: 79 80 79 77  Resp: 18 18 18 18   Temp: 97.6 F (36.4 C) (!) 97.4 F (36.3 C) (!) 97.4 F (36.3 C) (!) 97.5 F (36.4 C)  TempSrc: Oral Oral Oral Oral  SpO2: 95% 93% 95% 99%  Weight:      Height:       Physical Exam Vitals reviewed.  Constitutional:      General: She is not in acute distress.    Appearance: She is not ill-appearing or toxic-appearing.  Cardiovascular:     Rate and Rhythm: Normal rate and regular  rhythm.     Heart sounds: No murmur heard. Pulmonary:     Effort: Pulmonary effort is normal. No respiratory distress.     Breath sounds: No wheezing, rhonchi or rales.  Neurological:     Mental Status: She is alert.  Psychiatric:        Mood and Affect: Mood normal.        Behavior: Behavior normal.     Data Reviewed: Creatinine stable 1.46, BUN up to 43 AP up to 2957 AST, ALT trending up Tbili 14.5 > 17.3  Family Communication: husband at bedside  Disposition: Status is: Inpatient Remains inpatient appropriate because: plan for home with hospice  Planned Discharge Destination: Home with hospice    Time spent: 25 minutes  Author: Brendia Sacks, MD 09/06/2022 8:12 AM  For on call review [email or web address].

## 2022-09-06 NOTE — Progress Notes (Signed)
Civil engineer, contracting [facility]) Hospital Liaison Note  Referral received for patient/family interest in Hospice at home. [facility] liaison spoke with patient to confirm interest. Interest confirmed.   Plan is to discharge home tomorrow.   No DME needs at this time.   Please send comfort medications/prescriptions home with patient at discharge.   Please call with any questions or concerns. Thank you  Dionicio Stall, [facility] Liaison [phone]

## 2022-09-06 NOTE — TOC Initial Note (Signed)
Transition of Care [facility]) - Initial/Assessment Note    Patient Details  Name: Debbie [name] MRN: [id number] Date of Birth: [birth year]  Transition of Care [facility]) CM/SW Contact:    Amada Jupiter, LCSW Phone Number: 09/06/2022, 2:42 PM  Clinical Narrative:                   Met today with pt, spouse and father to review [facility] referral for Hospice services in home.  All very pleasant and pt notes, "just taking all of this in right now...". She is agreeable with plan for home with Hospice and chooses [employer] for agency coverage.  Have placed referral with [facility] who will now reach out to patient to review home needs. TOC will continue to follow for any additional needs as they arise.  Expected Discharge Plan: Home w Hospice Care Barriers to Discharge: Continued Medical Work up   Patient Goals and CMS Choice Patient states their goals for this hospitalization and ongoing recovery are:: return home          Expected Discharge Plan and Services In-house Referral: Clinical Social Work, Hospice / Palliative Care   Post Acute Care Choice: Hospice Living arrangements for the past 2 months: Single Family Home                           HH Arranged: RN [facility] Agency: [facility] ([employer]) Date HH Agency Contacted: 09/06/22 Time HH Agency Contacted: 1441 Representative spoke with at [facility] Agency: Dionicio Stall, RN  Prior Living Arrangements/Services Living arrangements for the past 2 months: Single Family Home Lives with:: Spouse Patient language and need for interpreter reviewed:: Yes Do you feel safe going back to the place where you live?: Yes      Need for Family Participation in Patient Care: Yes (Comment) Care giver support system in place?: Yes (comment)   Criminal Activity/Legal Involvement Pertinent to Current Situation/Hospitalization: No - Comment as needed  Activities of Daily Living Home Assistive Devices/Equipment:  Cane (specify quad or straight) ADL Screening (condition at time of admission) Patient's cognitive ability adequate to safely complete daily activities?: Yes Is the patient deaf or have difficulty hearing?: No Does the patient have difficulty seeing, even when wearing glasses/contacts?: No Does the patient have difficulty concentrating, remembering, or making decisions?: No Patient able to express need for assistance with ADLs?: Yes Does the patient have difficulty dressing or bathing?: No Independently performs ADLs?: Yes (appropriate for developmental age) Does the patient have difficulty walking or climbing stairs?: No Weakness of Legs: None Weakness of Arms/Hands: None  Permission Sought/Granted Permission sought to share information with : Family Supports Permission granted to share information with : Yes, Verbal Permission Granted  Share Information with NAME: Marbella Markgraf, spouse @ [phone]           Emotional Assessment Appearance:: Appears stated age Attitude/Demeanor/Rapport: Gracious Affect (typically observed): Accepting, Calm Orientation: : Oriented to Self, Oriented to Place, Oriented to  Time, Oriented to Situation Alcohol / Substance Use: Not Applicable Psych Involvement: No (comment)  Admission diagnosis:  Hyperbilirubinemia [E80.6] Patient Active Problem List   Diagnosis Date Noted   AKI (acute kidney injury) (HCC) 09/03/2022   Hyperbilirubinemia 08/31/2022   Candidiasis of vagina 08/31/2022   History of hyperthyroidism 08/31/2022   Hypercholesterolemia 08/31/2022   Menorrhagia 08/31/2022   History of menorrhagia 08/31/2022   Female pelvic inflammatory disease 08/31/2022   Pancreatic mass 08/31/2022  Hyperphosphatemia 08/31/2022   Hypokalemia 08/31/2022   Normocytic anemia 08/31/2022   Obstructive jaundice due to cancer [facility]) 08/31/2022   Carcinoma of breast metastatic to multiple sites [facility]) 08/31/2022   Port-A-Cath in place 08/27/2021    Encounter for antineoplastic chemotherapy 10/08/2020   Lower extremity edema 10/08/2020   Malignant neoplasm metastatic to lung (HCC) 09/10/2020   Goals of care, counseling/discussion 12/27/2018   Osteonecrosis (HCC) 11/01/2018   Pathological fracture in neoplastic disease, left femur, initial encounter for fracture (HCC) 01/28/2018   Morbid obesity with BMI of 40.0-44.9, adult (HCC) 10/12/2017   IUD complication (HCC) 09/01/2017   Malignant neoplasm metastatic to bone (HCC) 08/13/2017   Pain from bone metastases (HCC) 08/13/2017   Malignant neoplasm metastatic to liver (HCC) 08/13/2017   Steatosis of liver 08/13/2017   Bilateral malignant neoplasm of breast in female, estrogen receptor positive (HCC) 12/26/2015   Malignant neoplasm of central portion of left breast in female, estrogen receptor positive (HCC) 11/28/2015   Malignant neoplasm of upper-inner quadrant of right breast in female, estrogen receptor positive (HCC) 10/17/2015   Hypertension 04/19/2012   Hypothyroid 04/19/2012   Fibroids 04/19/2012   Stress incontinence, female 04/19/2012   PCP:  Ollen Bowl, MD Pharmacy:   [facility] [address] [place] [address] Phone: [phone] Fax: [phone]     Social Determinants of Health (SDOH) Social History: SDOH Screenings   Food Insecurity: No Food Insecurity (08/31/2022)  Housing: Low Risk  (08/31/2022)  Transportation Needs: No Transportation Needs (08/31/2022)  Utilities: Not At Risk (08/31/2022)  Tobacco Use: Low Risk  (09/04/2022)   SDOH Interventions:     Readmission Risk Interventions    09/06/2022    2:38 PM  Readmission Risk Prevention Plan  Post Dischage Appt Complete  Medication Screening Complete  Transportation Screening Complete

## 2022-09-06 NOTE — Progress Notes (Signed)
ONCOLOGY CC: Metastatic breast cancer with extensive liver metastases HPI: Patient with a history of metastatic breast cancer for the past 5 years was admitted with sudden onset of jaundice and initial workup did reveal extensive liver metastases but also an obstructive lesion at the ampulla.  Dr. Leone Payor GI were able to stent this obstruction after 2 ERCPs, it does not appear to be helping her jaundice. Dr. Leone Payor informed me yesterday that the obstruction may be intrahepatic and further interventions would not be of much help because it is related to metastatic breast cancer.  Discussion: I reviewed with the patient her progress over the past 6 days the fact that her biliary obstruction is not able to be circumvented with the stent is extremely unfavorable in terms of prognosis. I discussed with her that no amount of further systemic treatments would be beneficial and only decrease the time that she has available to live because of toxicities that may arise as a result of our treatments.  Plan: I recommended hospice care for her.  She is agreeable.  Her major concern is the financial impact if she does not continue to work.  She will inform her family today. I will be available if there are additional questions.

## 2022-09-07 ENCOUNTER — Other Ambulatory Visit (HOSPITAL_COMMUNITY): Payer: Self-pay

## 2022-09-07 ENCOUNTER — Encounter (HOSPITAL_COMMUNITY): Payer: Self-pay | Admitting: Internal Medicine

## 2022-09-07 DIAGNOSIS — K831 Obstruction of bile duct: Secondary | ICD-10-CM | POA: Diagnosis not present

## 2022-09-07 DIAGNOSIS — C50919 Malignant neoplasm of unspecified site of unspecified female breast: Secondary | ICD-10-CM | POA: Diagnosis not present

## 2022-09-07 DIAGNOSIS — N179 Acute kidney failure, unspecified: Secondary | ICD-10-CM | POA: Diagnosis not present

## 2022-09-07 LAB — HEPATIC FUNCTION PANEL
ALT: 478 U/L — ABNORMAL HIGH (ref 0–44)
AST: 895 U/L — ABNORMAL HIGH (ref 15–41)
Albumin: 2.1 g/dL — ABNORMAL LOW (ref 3.5–5.0)
Alkaline Phosphatase: 3005 U/L — ABNORMAL HIGH (ref 38–126)
Bilirubin, Direct: 11.6 mg/dL — ABNORMAL HIGH (ref 0.0–0.2)
Indirect Bilirubin: 4.7 mg/dL — ABNORMAL HIGH (ref 0.3–0.9)
Total Bilirubin: 16.3 mg/dL — ABNORMAL HIGH (ref 0.3–1.2)
Total Protein: 6 g/dL — ABNORMAL LOW (ref 6.5–8.1)

## 2022-09-07 LAB — BASIC METABOLIC PANEL
Anion gap: 9 (ref 5–15)
BUN: 43 mg/dL — ABNORMAL HIGH (ref 6–20)
CO2: 23 mmol/L (ref 22–32)
Calcium: 10.1 mg/dL (ref 8.9–10.3)
Chloride: 104 mmol/L (ref 98–111)
Creatinine, Ser: 1.26 mg/dL — ABNORMAL HIGH (ref 0.44–1.00)
GFR, Estimated: 49 mL/min — ABNORMAL LOW (ref >60)
Glucose, Bld: 91 mg/dL (ref 70–99)
Potassium: 3.4 mmol/L — ABNORMAL LOW (ref 3.5–5.1)
Sodium: 136 mmol/L (ref 135–145)

## 2022-09-07 MED ORDER — CHOLESTYRAMINE LIGHT 4 G PO PACK
4.0000 g | PACK | Freq: Two times a day (BID) | ORAL | 2 refills | Status: DC
  Filled 2022-09-07: qty 60, 30d supply, fill #0

## 2022-09-07 MED ORDER — SERTRALINE HCL 100 MG PO TABS
150.0000 mg | ORAL_TABLET | Freq: Every day | ORAL | 1 refills | Status: DC
  Filled 2022-09-07: qty 90, 60d supply, fill #0

## 2022-09-07 MED ORDER — FUROSEMIDE 20 MG PO TABS: 20.0000 mg | ORAL_TABLET | Freq: Every day | ORAL | Status: DC | PRN

## 2022-09-07 MED ORDER — HYDROCORTISONE 1 % EX CREA
1.0000 | TOPICAL_CREAM | Freq: Three times a day (TID) | CUTANEOUS | Status: DC | PRN
  Administered 2022-09-07: 1 via TOPICAL
  Filled 2022-09-07: qty 28

## 2022-09-07 MED ORDER — HYDROCORTISONE 1 % EX CREA
1.0000 | TOPICAL_CREAM | Freq: Three times a day (TID) | CUTANEOUS | 0 refills | Status: DC | PRN
  Filled 2022-09-07: qty 30, 7d supply, fill #0

## 2022-09-07 MED ORDER — POTASSIUM CHLORIDE CRYS ER 20 MEQ PO TBCR
40.0000 meq | EXTENDED_RELEASE_TABLET | Freq: Once | ORAL | Status: AC
  Administered 2022-09-07: 40 meq via ORAL
  Filled 2022-09-07: qty 2

## 2022-09-07 MED ORDER — POLYETHYLENE GLYCOL 3350 17 G PO PACK
17.0000 g | PACK | Freq: Every day | ORAL | 0 refills | Status: DC | PRN
  Filled 2022-09-07: qty 30, 30d supply, fill #0

## 2022-09-07 MED ORDER — DIPHENHYDRAMINE HCL 25 MG PO CAPS: 25.0000 mg | ORAL_CAPSULE | Freq: Four times a day (QID) | ORAL | Status: DC

## 2022-09-07 NOTE — Progress Notes (Signed)
[facility] ([facility])   If applicable, please send signed and completed DNR with patient/family upon discharge. Please provide prescriptions at discharge as needed to ensure ongoing symptom management and a transport packet.   [facility] information and contact numbers given to family and above information shared with TOC.    Please call with any questions/concerns.    Thank you for the opportunity to participate in this patient's care   Shania Junious, MSW [facility] Hospital Liaison  [phone]   

## 2022-09-07 NOTE — Anesthesia Postprocedure Evaluation (Signed)
Anesthesia Post Note  Patient: KENADEE GATES  Procedure(s) Performed: ENDOSCOPIC RETROGRADE CHOLANGIOPANCREATOGRAPHY (ERCP) WITH PROPOFOL STENT REMOVAL BILIARY BRUSHING BILIARY STENT PLACEMENT     Patient location during evaluation: PACU Anesthesia Type: General Level of consciousness: awake and alert Pain management: pain level controlled Vital Signs Assessment: post-procedure vital signs reviewed and stable Respiratory status: spontaneous breathing, nonlabored ventilation, respiratory function stable and patient connected to nasal cannula oxygen Cardiovascular status: blood pressure returned to baseline and stable Postop Assessment: no apparent nausea or vomiting Anesthetic complications: no  No notable events documented.  Last Vitals:  Vitals:   09/06/22 2010 09/07/22 0453  BP: 139/81 (!) 101/54  Pulse: 79 80  Resp: 18 18  Temp: 36.7 C 36.6 C  SpO2: 97% 95%    Last Pain:  Vitals:   09/07/22 0739  TempSrc:   PainSc: 0-No pain                 Trevor Iha

## 2022-09-07 NOTE — Discharge Summary (Addendum)
Physician Discharge Summary   Patient: Debbie [name] MRN: [id number] DOB: [birth year]  Admit date:     08/31/2022  Discharge date: 09/07/22  Discharge Physician: Brendia Sacks   PCP: Ollen Bowl, MD   Recommendations at discharge:   Home with hospice.  Increased sertraline for anxiety.  Stopped antihypertensives as below.  Corrected calcium currently elevated. Will notify Dr. Pamelia Hoit for any further recs.  Discharge Diagnoses: Principal Problem:   Obstructive jaundice due to cancer [facility]) Active Problems:   Hypertension   Hypothyroid   Bilateral malignant neoplasm of breast in female, estrogen receptor positive (HCC)   Malignant neoplasm metastatic to bone (HCC)   Malignant neoplasm metastatic to liver (HCC)   Morbid obesity with BMI of 40.0-44.9, adult (HCC)   Malignant neoplasm metastatic to lung (HCC)   Hyperbilirubinemia   Hypercholesterolemia   Pancreatic mass   Hyperphosphatemia   Hypokalemia   Normocytic anemia   Carcinoma of breast metastatic to multiple sites (HCC)   AKI (acute kidney injury) (HCC) Hypercalcemia of malignancy  Resolved Problems:   * No resolved hospital problems. *  Hospital Course: 57 year old woman PMH including breast cancer with metastatic disease presented with jaundice.  Admitted for obstructive jaundice symptoms secondary to pancreatic mass, thought secondary to metastatic breast cancer.  Seen by gastroenterology and underwent ERCP but total bilirubin failed to improve.  Repeat ERCP was performed confirming stent placement as well as increase in caliber of stent.  Despite this LFTs continue to worsen.  Was seen by oncology, no further treatment options were recommended.  Hospice was recommended.  Patient was discharged home with hospice.  Consultants GI Oncology   Procedures 4/23 ERCP 4/26 ERCP  Obstructive jaundice secondary to pancreatic mass Metastatic breast cancer with metastatic disease to bone, liver, lung LFTs  continue to trend up despite repeat ERCP 4/26. Discussed with Drs. Leone Payor and [place]. Concern for malignancy as etiology. Dr. Pamelia Hoit has recommended home with hospice.  No further systemic treatments. Cholestyramine for pruritus . If fails, consider rifampin as next step. Sertraline increased for anxiety   AKI Baseline creatinine 0.9 Renal ultrasound negative for hydronephrosis Creatinine improved significantly only to rebound to 1.93, now down to 1.26.  Encouraged fluid intake.   Hypercalcemia of malignancy Corrected calcium elevated Will apprise Dr. Pamelia Hoit > defer further recommendations to him   Essential hypertension Losartan, HCTZ discontinued given controlled blood pressure. Continues Lasix as needed.   Hypothyroidism Continue levothyroxine   Obesity Body mass index is 45.05 kg/m.   Hypokalemia Replaced      Disposition: Home with hospice Diet recommendation:  As desired DISCHARGE MEDICATION: Allergies as of 09/07/2022       Reactions   Ace Inhibitors Cough   Atorvastatin Other (See Comments)   Joint pain   Simvastatin Other (See Comments)   Joint pain        Medication List     STOP taking these medications    acetaminophen 500 MG tablet Commonly known as: TYLENOL   Calcium-Magnesium 250-125 MG Tabs   chlorhexidine 0.12 % solution Commonly known as: PERIDEX   flurbiprofen 100 MG tablet Commonly known as: ANSAID   losartan-hydrochlorothiazide 100-25 MG tablet Commonly known as: HYZAAR   rosuvastatin 5 MG tablet Commonly known as: CRESTOR   Trodelvy 180 MG chemo injection Generic drug: sacituzumab [id number]       TAKE these medications    cetirizine 10 MG tablet Commonly known as: ZYRTEC Take 10 mg by mouth daily.   cholestyramine light 4  g packet Commonly known as: PREVALITE Take 1 packet (4 g total) by mouth 2 (two) times daily.   cyanocobalamin 1000 MCG tablet Commonly known as: VITAMIN B12 Take 1,000 mcg by mouth  daily.   cyclobenzaprine 5 MG tablet Commonly known as: FLEXERIL Take 1 tablet (5 mg total) by mouth 3 (three) times daily as needed for muscle spasms. What changed: when to take this   diphenhydrAMINE 25 mg capsule Commonly known as: BENADRYL Take 1 capsule (25 mg total) by mouth 4 (four) times daily.   diphenoxylate-atropine 2.5-0.025 MG tablet Commonly known as: LOMOTIL Take 1 tablet by mouth 4 (four) times daily as needed for diarrhea or loose stools. What changed: when to take this   FREESTYLE LITE test strip Generic drug: glucose blood Use 1 strip to check blood sugar 3 (three) times daily.   furosemide 20 MG tablet Commonly known as: LASIX Take 1 tablet (20 mg total) by mouth daily as needed for fluid or edema. What changed:  when to take this reasons to take this   hydrocortisone cream 1 % Apply 1 Application topically 3 (three) times daily as needed for itching (minor skin irritation).   ibuprofen 200 MG tablet Commonly known as: ADVIL Take 200 mg by mouth as needed for moderate pain. What changed: Another medication with the same name was removed. Continue taking this medication, and follow the directions you see here.   levothyroxine 175 MCG tablet Commonly known as: SYNTHROID Take 1 tablet (175 mcg total) by mouth in the morning on an empty stomach   metFORMIN 500 MG 24 hr tablet Commonly known as: GLUCOPHAGE-XR Take 1 tablet (500 mg total) by mouth every evening with meal   multivitamin capsule Take 1 capsule by mouth daily.   omega-3 acid ethyl esters 1 g capsule Commonly known as: LOVAZA Take 1 g by mouth daily.   omeprazole 40 MG capsule Commonly known as: PRILOSEC Take 1 capsule (40 mg total) by mouth daily.   ondansetron 8 MG disintegrating tablet Commonly known as: ZOFRAN-ODT Dissolve 1 tablet (8 mg total) by mouth every 8 (eight) hours as needed for nausea or vomiting. What changed: when to take this   oxyCODONE-acetaminophen 5-325 MG  tablet Commonly known as: PERCOCET/ROXICET Take 1 tablet by mouth every 8 (eight) hours as needed for severe pain. What changed: when to take this   polyethylene glycol 17 g packet Commonly known as: MiraLax Take 17 g by mouth daily as needed for mild constipation or moderate constipation.   potassium chloride 10 MEQ tablet Commonly known as: KLOR-CON Take 3 tablets (30 mEq total) by mouth daily.   pregabalin 100 MG capsule Commonly known as: LYRICA Take 1 capsule by mouth 3 times daily. What changed: Another medication with the same name was removed. Continue taking this medication, and follow the directions you see here.   sertraline 100 MG tablet Commonly known as: ZOLOFT Take 1.5 tablets (150 mg total) by mouth daily. What changed: how much to take   Vitamin D (Cholecalciferol) 25 MCG (1000 UT) Caps Take 1 tablet by mouth daily. What changed: how much to take        Follow-up Information     Pahwani, Kasandra Knudsen, MD Follow up.   Specialty: Internal Medicine Why: As needed Contact information: [address] [place] [address] [phone]                Feels ok today  Discharge Exam: [facility] We[facility]  08/31/22 0900 09/04/22 1209  Weight: 126.6 kg 126.6 kg   Physical Exam Vitals reviewed.  Constitutional:      General: She is not in acute distress.    Appearance: She is not ill-appearing or toxic-appearing.  Cardiovascular:     Rate and Rhythm: Normal rate and regular rhythm.     Heart sounds: No murmur heard. Pulmonary:     Effort: Pulmonary effort is normal. No respiratory distress.     Breath sounds: No wheezing, rhonchi or rales.  Neurological:     Mental Status: She is alert.  Psychiatric:        Mood and Affect: Mood normal.        Behavior: Behavior normal.      Condition at discharge: good  The results of significant diagnostics from this hospitalization (including imaging, microbiology, ancillary and laboratory) are listed  below for reference.   Imaging Studies: US RENAL  Result Date: 09/06/2022 CLINICAL DATA:  Acute renal insufficiency EXAM: RENAL / URINARY TRACT ULTRASOUND COMPLETE COMPARISON:  None Available. FINDINGS: Right Kidney: Renal measurements: 11.5 x 4.9 x 5.0 cm = volume: 147 mL. Echogenicity within normal limits. No mass or hydronephrosis visualized. Left Kidney: Renal measurements: 10.7 x 5.6 x 5.2 cm = volume: 166 mL. Echogenicity within normal limits. No mass or hydronephrosis visualized. Bladder: Appears normal for degree of bladder distention. Other: None. IMPRESSION: Normal study. Electronically Signed   By: Gerome Sam III M.D.   On: 09/06/2022 12:00   DG ERCP  Result Date: 09/04/2022 CLINICAL DATA:  57 year old female with prior biliary stent EXAM: ERCP TECHNIQUE: Multiple spot images obtained with the fluoroscopic device and submitted for interpretation post-procedure. FLUOROSCOPY: Radiation Exposure Index (as provided by the fluoroscopic device): 152.3 mGy Kerma COMPARISON:  09/01/2022 FINDINGS: Limited intraoperative fluoroscopic spot images during ERCP. Initial image demonstrates endoscope projecting over the upper abdomen. Plastic biliary stent in position. Subsequently there is retrograde infusion of contrast partial opacification of the intrahepatic and extrahepatic biliary system and removal of a plastic stent. Cannulation with a safety wire and then placement of a fresh plastic biliary stent. IMPRESSION: Limited images during ERCP demonstrates exchange of plastic biliary stents. Please refer to the dictated operative report for full details of intraoperative findings and procedure. Electronically Signed   By: Gilmer Mor D.O.   On: 09/04/2022 16:23   DG Abd 2 Views  Result Date: 09/03/2022 CLINICAL DATA:  Jaundice.  Evaluate biliary stent placement. EXAM: ABDOMEN - 2 VIEW COMPARISON:  MRI 08/28/2022 FINDINGS: Exam detail is diminished secondary to patient body habitus. There is a stent  identified within the lower abdomen at the level of the L3 and L4 vertebra, eccentric to the left of midline. A second stent is identified within the right abdomen adjacent to the L2 and L3 vertebra. Bowel gas pattern appears nonobstructed. No dilated loops of large or small bowel. Moderate stool burden in the colon. IMPRESSION: 1. Bile duct stents are identified. One stent is identified within the left lower abdomen adjacent to the L3 and L4 vertebra. A second stent is noted within the right abdomen adjacent to the L2 and L3 vertebra. 2. Nonobstructive bowel gas pattern. 3. Moderate stool burden within the colon. Electronically Signed   By: Signa Kell M.D.   On: 09/03/2022 13:14   DG ERCP  Result Date: 09/01/2022 CLINICAL DATA:  ERCP. EXAM: ERCP TECHNIQUE: Multiple spot images obtained with the fluoroscopic device and submitted for interpretation post-procedure. FLUOROSCOPY TIME: FLUOROSCOPY TIME 279.2 mGy COMPARISON:  MRCP-08/28/2022 FINDINGS: 5 spot intraoperative fluoroscopic images of the right upper abdominal quadrant during ERCP are provided for review. Initial image since demonstrates an ERCP probe overlying the right upper abdominal quadrant. Subsequent images demonstrate selective cannulation and opacification of the pancreatic duct with subsequent cannulation and opacification of the common bile duct which appears moderately dilated. There is apparent malignant narrowing/subtotal occlusion involving the distal aspect of the CBD (best seen on image 3). There is minimal opacification of the intrahepatic biliary tree which appears mildly dilated. There is faint opacification of the cystic duct. Subsequent images demonstrate placement of a CBD stent as well as a suspected pancreatic stent, suboptimally evaluated due to patient motion. IMPRESSION: ERCP with placement of CBD and pancreatic duct stents as above. These images were submitted for radiologic interpretation only. Please see the procedural  report for the amount of contrast and the fluoroscopy time utilized. Electronically Signed   By: Simonne Come M.D.   On: 09/01/2022 14:53   DG C-Arm 1-60 Min-No Report  Result Date: 09/01/2022 Fluoroscopy was utilized by the requesting physician.  No radiographic interpretation.   US RENAL  Result Date: 09/01/2022 CLINICAL DATA:  Acute renal dysfunction EXAM: RENAL / URINARY TRACT ULTRASOUND COMPLETE COMPARISON:  08/27/2022 FINDINGS: Right Kidney: Renal measurements: 11.9 x 5 x 6.8 cm = volume: 210.9 mL. Echogenicity within normal limits. No mass or hydronephrosis visualized. Left Kidney: Renal measurements: 11.5 x 5.8 x 5.7 cm = volume: 199.8 mL. Increased cortical echogenicity maybe a technical artifact related to patient's body habitus. No mass or hydronephrosis visualized. Bladder: Appears normal for degree of bladder distention. Other: Examination is technically limited due to patient's body habitus. IMPRESSION: No hydronephrosis. Electronically Signed   By: Ernie Avena M.D.   On: 09/01/2022 10:38   MR 3D Recon At Scanner  Result Date: 08/31/2022 CLINICAL DATA:  Jaundice, history of metastatic breast cancer, elevated bilirubin EXAM: MRI ABDOMEN WITHOUT AND WITH CONTRAST (INCLUDING MRCP) TECHNIQUE: Multiplanar multisequence MR imaging of the abdomen was performed both before and after the administration of intravenous contrast. Heavily T2-weighted images of the biliary and pancreatic ducts were obtained, and three-dimensional MRCP images were rendered by post processing. CONTRAST:  10 mL view a gadolinium contrast IV COMPARISON:  Abdominal ultrasound, 08/27/2022 CT chest abdomen pelvis, 08/13/2022, MR abdomen, 04/01/2021 FINDINGS: Lower chest: No acute abnormality.  Numerous pulmonary nodules. Hepatobiliary: Hepatic steatosis. Nodular contour of the liver, most likely reflecting pseudocirrhosis in the setting of known hepatic metastatic disease. Innumerable heterogeneously enhancing liver  metastases of varying sizes throughout the liver, much more clearly appreciated by MR than on recent CT and greatly increased in size and number in comparison to relatively remote MR dated 04/01/2021. Distended gallbladder with severe intra and extrahepatic biliary ductal dilatation, the central common bile duct sharply narrowed within the central pancreatic head and reconstituted above the ampulla (series 12, image 8). Pancreas: The central common bile duct is sharply narrowed within the central pancreatic head as noted above. There is an intrinsically T2 hyperintense, diffusion restricting mass within the central pancreatic head underlying this obstruction measuring 2.2 x 2.0 cm (series 7, image 22). No pancreatic ductal dilatation or surrounding inflammatory changes. Spleen: Normal in size without significant abnormality. Adrenals/Urinary Tract: Adrenal glands are unremarkable. Kidneys are normal, without renal calculi, solid lesion, or hydronephrosis. Stomach/Bowel: Stomach is within normal limits. No evidence of bowel wall thickening, distention, or inflammatory changes. Vascular/Lymphatic: No significant vascular findings are present. Numerous enlarged retroperitoneal lymph nodes, partially imaged and similar to  prior CT examination. Other: No abdominal wall hernia or abnormality. No ascites. Musculoskeletal: Widespread, heterogeneously enhancing osseous metastatic disease throughout all included structures. IMPRESSION: 1. Innumerable heterogeneously enhancing liver metastases of varying sizes throughout the liver, much more clearly appreciated by MR than on recent CT and greatly increased in size and number in comparison to relatively remote MR dated 04/01/2021. 2. Distended gallbladder with severe intra and extrahepatic biliary ductal dilatation, the central common bile duct sharply narrowed within the central pancreatic head and reconstituted above the ampulla. Obstructing mass within the central pancreatic  head underlying this obstruction measuring 2.2 x 2.0 cm. In the presence of known widespread metastatic disease would generally presume that this reflects an intrapancreatic metastasis, although metachronous pancreatic adenocarcinoma is in general a differential consideration. 3. Numerous enlarged retroperitoneal lymph nodes, partially imaged and similar to prior CT examination. 4. Numerous metastatic pulmonary nodules in the included lung bases, poorly imaged by MR. 5. Widespread osseous metastatic disease. These results will be called to the ordering clinician or representative by the Radiologist Assistant, and communication documented in the PACS or [employer]. Electronically Signed   By: Jearld Lesch M.D.   On: 08/31/2022 06:32   MR ABDOMEN MRCP W WO CONTAST  Result Date: 08/28/2022 CLINICAL DATA:  Jaundice, history of metastatic breast cancer, elevated bilirubin EXAM: MRI ABDOMEN WITHOUT AND WITH CONTRAST (INCLUDING MRCP) TECHNIQUE: Multiplanar multisequence MR imaging of the abdomen was performed both before and after the administration of intravenous contrast. Heavily T2-weighted images of the biliary and pancreatic ducts were obtained, and three-dimensional MRCP images were rendered by post processing. CONTRAST:  10 mL view a gadolinium contrast IV COMPARISON:  Abdominal ultrasound, 08/27/2022 CT chest abdomen pelvis, 08/13/2022, MR abdomen, 04/01/2021 FINDINGS: Lower chest: No acute abnormality.  Numerous pulmonary nodules. Hepatobiliary: Hepatic steatosis. Nodular contour of the liver, most likely reflecting pseudocirrhosis in the setting of known hepatic metastatic disease. Innumerable heterogeneously enhancing liver metastases of varying sizes throughout the liver, much more clearly appreciated by MR than on recent CT and greatly increased in size and number in comparison to relatively remote MR dated 04/01/2021. Distended gallbladder with severe intra and extrahepatic biliary ductal  dilatation, the central common bile duct sharply narrowed within the central pancreatic head and reconstituted above the ampulla (series 12, image 8). Pancreas: The central common bile duct is sharply narrowed within the central pancreatic head as noted above. There is an intrinsically T2 hyperintense, diffusion restricting mass within the central pancreatic head underlying this obstruction measuring 2.2 x 2.0 cm (series 7, image 22). No pancreatic ductal dilatation or surrounding inflammatory changes. Spleen: Normal in size without significant abnormality. Adrenals/Urinary Tract: Adrenal glands are unremarkable. Kidneys are normal, without renal calculi, solid lesion, or hydronephrosis. Stomach/Bowel: Stomach is within normal limits. No evidence of bowel wall thickening, distention, or inflammatory changes. Vascular/Lymphatic: No significant vascular findings are present. Numerous enlarged retroperitoneal lymph nodes, partially imaged and similar to prior CT examination. Other: No abdominal wall hernia or abnormality. No ascites. Musculoskeletal: Widespread, heterogeneously enhancing osseous metastatic disease throughout all included structures. IMPRESSION: 1. Innumerable heterogeneously enhancing liver metastases of varying sizes throughout the liver, much more clearly appreciated by MR than on recent CT and greatly increased in size and number in comparison to relatively remote MR dated 04/01/2021. 2. Distended gallbladder with severe intra and extrahepatic biliary ductal dilatation, the central common bile duct sharply narrowed within the central pancreatic head and reconstituted above the ampulla. Obstructing mass within the central pancreatic head underlying this obstruction  measuring 2.2 x 2.0 cm. In the presence of known widespread metastatic disease would generally presume that this reflects an intrapancreatic metastasis, although metachronous pancreatic adenocarcinoma is in general a differential  consideration. 3. Numerous enlarged retroperitoneal lymph nodes, partially imaged and similar to prior CT examination. 4. Numerous metastatic pulmonary nodules in the included lung bases, poorly imaged by MR. 5. Widespread osseous metastatic disease. These results will be called to the ordering clinician or representative by the Radiologist Assistant, and communication documented in the PACS or [employer]. Electronically Signed   By: Jearld Lesch M.D.   On: 08/28/2022 22:19   US Abdomen Complete  Result Date: 08/27/2022 CLINICAL DATA:  Elevated liver enzymes. History of metastatic breast cancer. EXAM: ABDOMEN ULTRASOUND COMPLETE COMPARISON:  CT scan of the abdomen and pelvis August 13, 2022 FINDINGS: Gallbladder: The gallbladder is distended without wall thickening, stones, sludge, or Murphy's sign. Common bile duct: Diameter: 10 mm Liver: Diffuse increased echogenicity. Markedly heterogeneous echogenicity. Multiple low-attenuation lesions identified in the liver, hypoechoic. The largest in the right hepatic lobe measures 3.1 x 3.4 x 3.4 cm. Portal vein is patent on color Doppler imaging with normal direction of blood flow towards the liver. IVC: Not well visualized Pancreas: Not well visualized Spleen: Size and appearance within normal limits. Right Kidney: Length: 11.5 cm. Echogenicity within normal limits. No mass or hydronephrosis visualized. Left Kidney: Length: 11.3 cm. Echogenicity within normal limits. No mass or hydronephrosis visualized. Abdominal aorta: Mostly obscured. Other findings: None. IMPRESSION: 1. Multiple hypoechoic masses in the liver. These masses are nonspecific today. Some appear to represent cysts. However, given the history of liver metastases, the findings are concerning for underlying metastases. MRI of the abdomen with and without contrast could better evaluate. 2. The gallbladder is distended. The common bile duct is dilated measuring 10 mm. Recommend MRCP or ERCP for better  evaluation. 3. The IVC, pancreas, and aorta are not well visualized on today's study. Electronically Signed   By: Gerome Sam III M.D.   On: 08/27/2022 17:46   DG Shoulder Right  Result Date: 08/27/2022 CLINICAL DATA:  Right shoulder pain for 3 days. Patient heard a pop and has limited range of motion. EXAM: RIGHT SHOULDER - 2+ VIEW COMPARISON:  None Available. FINDINGS: The mineralization and alignment are normal. There is no evidence of acute fracture or dislocation. There are mild acromioclavicular degenerative changes. No significant narrowing of the subacromial space. Postsurgical changes noted in the right axilla. There is a right IJ Port-A-Cath in place. IMPRESSION: No acute osseous findings. Mild acromioclavicular degenerative changes. Electronically Signed   By: Carey Bullocks M.D.   On: 08/27/2022 09:01   CT CHEST ABDOMEN PELVIS W CONTRAST  Result Date: 08/13/2022 CLINICAL DATA:  Metastatic breast carcinoma. Stage IV breast carcinoma with history of liver and bone metastasis. Ongoing therapy. * Tracking Code: BO *. EXAM: CT CHEST, ABDOMEN, AND PELVIS WITH CONTRAST TECHNIQUE: Multidetector CT imaging of the chest, abdomen and pelvis was performed following the standard protocol during bolus administration of intravenous contrast. RADIATION DOSE REDUCTION: This exam was performed according to the departmental dose-optimization program which includes automated exposure control, adjustment of the mA and/or kV according to patient size and/or use of iterative reconstruction technique. CONTRAST:  OMNIPAQUE IOHEXOL 300 MG/ML  SOLN COMPARISON:  CT 04/14/2022 FINDINGS: CT CHEST FINDINGS Cardiovascular: Port in the anterior chest wall with tip in distal SVC. No significant vascular findings. Normal heart size. No pericardial effusion. Mediastinum/Nodes: RIGHT hilar lymph node measuring 12  mm (image 23/2) compares to 8 mm. No supraclavicular adenopathy. Post RIGHT axillary nodal dissection. No  internal mammary adenopathy Lungs/Pleura: Mild subpleural reticulation in the RIGHT lung related to radiation treatment. Central RIGHT lower lobe pulmonary nodule measures 11 mm (image 72/4) compared to 9 mm. LEFT lobe pulmonary nodule measures 7 mm (image 109/4) compared to 5 mm. Nodule in the lateral lingula measures 7 mm (image 70) compared to 6 mm. Linear nodule in the medial lingula measuring 8 mm (image 64/4) is not seen on comparison exam. Musculoskeletal: Diffuse diffuse sclerotic and lytic skeletal lesions again noted within the spine and sternum as well as the ribs. No interval change. CT ABDOMEN AND PELVIS FINDINGS Hepatobiliary: Nodular contour of the liver consistent cirrhosis. No enhancing hepatic lesion identified. Pancreas: Pancreas is normal. No ductal dilatation. No pancreatic inflammation. Spleen: Normal spleen Adrenals/urinary tract: Adrenal glands and kidneys are normal. The ureters and bladder normal. Stomach/Bowel: Stomach, small bowel, appendix, and cecum are normal. The colon and rectosigmoid colon are normal. Vascular/Lymphatic: Abdominal aorta normal. Newly enlarged periaortic lymph nodes. For example aortocaval node on image 69/2 measures 12 mm. Cluster of lymph nodes LEFT aorta measure 12 mm and 11 mm on image 72/2. Lymph node ventral to the IVC at the level of the RIGHT renal hilum measures 14 mm increased from 5 (image 64/2). Reproductive: Uterus and adnexa unremarkable. Other: No peritoneal metastasis. Musculoskeletal: Diffuse sclerotic lesions within the pelvis in the spine with intermixed lytic components. No interval change. LEFT hip internal fixation. IMPRESSION: CHEST IMPRESSION: 1. Interval increase in size of bilateral pulmonary nodules consistent with pulmonary metastasis. 2. Interval increase in size of RIGHT hilar lymph node. 3. Stable diffuse sclerotic and lytic skeletal metastasis. PELVIS IMPRESSION: 1. Interval increase in size of periaortic retroperitoneal lymph nodes  consistent with progression of metastatic adenopathy. 2. No liver metastasis identified. 3. Nodular contour of the liver consistent with cirrhosis. 4. Stable diffuse sclerotic and lytic skeletal metastasis. Electronically Signed   By: Genevive Bi M.D.   On: 08/13/2022 12:14    Microbiology: No results found. However, due to the size of the patient record, not all encounters were searched. Please check Results Review for a complete set of results.  Labs: CBC: Recent Labs  Lab 09/01/22 0500 09/02/22 0531 09/03/22 0512  WBC 6.6 8.3 8.4  NEUTROABS 5.1 7.4 6.7  HGB 9.9* 9.5* 9.9*  HCT 31.0* 30.0* 30.6*  MCV 92.8 92.6 93.0  PLT 261 290 324   Basic Metabolic Panel: Recent Labs  Lab 09/01/22 0500 09/02/22 0531 09/03/22 0512 09/04/22 0529 09/05/22 0554 09/06/22 0506 09/07/22 0515  NA 135   < > 136 137 137 136 136  K 3.3*   < > 3.6 4.0 3.9 3.9 3.4*  CL 96*   < > 104 104 105 103 104  CO2 24   < > 21* 23 20* 20* 23  GLUCOSE 130*   < > 128* 87 109* 101* 91  BUN 37*   < > 28* 30* 35* 43* 43*  CREATININE 2.83*   < > 1.29* 1.93* 1.44* 1.46* 1.26*  CALCIUM 10.3   < > 9.9 10.3 10.8* 10.7* 10.1  MG 2.7*  --   --   --   --   --   --   PHOS 6.9*  --   --   --   --   --   --    < > = values in this interval not displayed.   Liver Function Tests:  Recent Labs  Lab 09/03/22 0512 09/04/22 0529 09/05/22 0554 09/06/22 0506 09/07/22 0515  AST 489* 754* 863* 956* 895*  ALT 270* 369* 438* 464* 478*  ALKPHOS 1,055* 1,622* 2,458* 2,957* 3,005*  BILITOT 13.3* 14.0* 14.5* 17.3* 16.3*  PROT 6.8 6.5 6.5 6.7 6.0*  ALBUMIN 2.5* 2.3* 2.1* 2.4* 2.1*   CBG: Recent Labs  Lab 09/03/22 0720 09/03/22 1115 09/03/22 1619 09/03/22 2136 09/04/22 0730  GLUCAP 107* 119* 97 106* 79    Discharge time spent: greater than 30 minutes.  Signed: Brendia Sacks, MD [facility] 09/07/2022

## 2022-09-07 NOTE — TOC Transition Note (Signed)
Transition of Care [facility]) - CM/SW Discharge Note   Patient Details  Name: Debbie [name] MRN: [id number] Date of Birth: February 07, 1966  [facility]) CM/SW Contact:  Beckie Busing, RN Phone [phone]  09/07/2022, 11:47 AM   Clinical Narrative:    Patient discharging home with hospice services. Spouse at bedside to transport home. Hospice with follow . No TOC needs noted at this time.    Final next level of care: Home w Hospice Care Barriers to Discharge: No Barriers Identified   Patient Goals and CMS Choice   Choice offered to / list presented to : NA  Discharge Placement                         Discharge Plan and Services Additional resources added to the After Visit Summary for   In-house Referral: Clinical Social Work, Hospice / Palliative Care   Post Acute Care Choice: Hospice          DME Arranged: N/A DME Agency: NA       HH Arranged: RN HH Agency: [facility] ([employer], contracting) Date HH Agency Contacted: 09/06/22 Time HH Agency Contacted: 1441 Representative spoke with at [facility] Agency: Dionicio Stall, RN  Social Determinants of Health (SDOH) Interventions SDOH Screenings   Food Insecurity: No Food Insecurity (08/31/2022)  Housing: Low Risk  (08/31/2022)  Transportation Needs: No Transportation Needs (08/31/2022)  Utilities: Not At Risk (08/31/2022)  Tobacco Use: Low Risk  (09/04/2022)     Readmission Risk Interventions    09/06/2022    2:38 PM  Readmission Risk Prevention Plan  Post Dischage Appt Complete  Medication Screening Complete  Transportation Screening Complete

## 2022-09-08 ENCOUNTER — Other Ambulatory Visit (HOSPITAL_COMMUNITY): Payer: Self-pay

## 2022-09-08 ENCOUNTER — Encounter: Payer: Self-pay | Admitting: Hematology and Oncology

## 2022-09-08 DIAGNOSIS — C7951 Secondary malignant neoplasm of bone: Secondary | ICD-10-CM | POA: Diagnosis not present

## 2022-09-08 DIAGNOSIS — C787 Secondary malignant neoplasm of liver and intrahepatic bile duct: Secondary | ICD-10-CM | POA: Diagnosis not present

## 2022-09-08 DIAGNOSIS — Z170 Estrogen receptor positive status [ER+]: Secondary | ICD-10-CM | POA: Diagnosis not present

## 2022-09-08 DIAGNOSIS — K219 Gastro-esophageal reflux disease without esophagitis: Secondary | ICD-10-CM | POA: Diagnosis not present

## 2022-09-08 DIAGNOSIS — F32A Depression, unspecified: Secondary | ICD-10-CM | POA: Diagnosis not present

## 2022-09-08 DIAGNOSIS — F319 Bipolar disorder, unspecified: Secondary | ICD-10-CM | POA: Diagnosis not present

## 2022-09-08 DIAGNOSIS — I10 Essential (primary) hypertension: Secondary | ICD-10-CM | POA: Diagnosis not present

## 2022-09-08 DIAGNOSIS — C50211 Malignant neoplasm of upper-inner quadrant of right female breast: Secondary | ICD-10-CM | POA: Diagnosis not present

## 2022-09-08 DIAGNOSIS — K831 Obstruction of bile duct: Secondary | ICD-10-CM | POA: Diagnosis not present

## 2022-09-08 DIAGNOSIS — C50912 Malignant neoplasm of unspecified site of left female breast: Secondary | ICD-10-CM | POA: Diagnosis not present

## 2022-09-09 DIAGNOSIS — C787 Secondary malignant neoplasm of liver and intrahepatic bile duct: Secondary | ICD-10-CM | POA: Diagnosis not present

## 2022-09-09 DIAGNOSIS — C50211 Malignant neoplasm of upper-inner quadrant of right female breast: Secondary | ICD-10-CM | POA: Diagnosis not present

## 2022-09-09 DIAGNOSIS — I10 Essential (primary) hypertension: Secondary | ICD-10-CM | POA: Diagnosis not present

## 2022-09-09 DIAGNOSIS — Z170 Estrogen receptor positive status [ER+]: Secondary | ICD-10-CM | POA: Diagnosis not present

## 2022-09-09 DIAGNOSIS — C50912 Malignant neoplasm of unspecified site of left female breast: Secondary | ICD-10-CM | POA: Diagnosis not present

## 2022-09-09 DIAGNOSIS — C7951 Secondary malignant neoplasm of bone: Secondary | ICD-10-CM | POA: Diagnosis not present

## 2022-09-09 DIAGNOSIS — K219 Gastro-esophageal reflux disease without esophagitis: Secondary | ICD-10-CM | POA: Diagnosis not present

## 2022-09-09 DIAGNOSIS — K831 Obstruction of bile duct: Secondary | ICD-10-CM | POA: Diagnosis not present

## 2022-09-09 DIAGNOSIS — F319 Bipolar disorder, unspecified: Secondary | ICD-10-CM | POA: Diagnosis not present

## 2022-09-09 DIAGNOSIS — F32A Depression, unspecified: Secondary | ICD-10-CM | POA: Diagnosis not present

## 2022-09-09 LAB — CYTOLOGY - NON PAP

## 2022-09-09 MED FILL — Fosaprepitant Dimeglumine For IV Infusion 150 MG (Base Eq): INTRAVENOUS | Qty: 5 | Status: AC

## 2022-09-09 MED FILL — Dexamethasone Sodium Phosphate Inj 100 MG/10ML: INTRAMUSCULAR | Qty: 1 | Status: AC

## 2022-09-10 ENCOUNTER — Inpatient Hospital Stay: Payer: Commercial Managed Care - PPO

## 2022-09-10 ENCOUNTER — Inpatient Hospital Stay: Payer: Commercial Managed Care - PPO | Admitting: Hematology and Oncology

## 2022-09-10 DIAGNOSIS — F32A Depression, unspecified: Secondary | ICD-10-CM | POA: Diagnosis not present

## 2022-09-10 DIAGNOSIS — K831 Obstruction of bile duct: Secondary | ICD-10-CM | POA: Diagnosis not present

## 2022-09-10 DIAGNOSIS — I10 Essential (primary) hypertension: Secondary | ICD-10-CM | POA: Diagnosis not present

## 2022-09-10 DIAGNOSIS — Z170 Estrogen receptor positive status [ER+]: Secondary | ICD-10-CM | POA: Diagnosis not present

## 2022-09-10 DIAGNOSIS — F319 Bipolar disorder, unspecified: Secondary | ICD-10-CM | POA: Diagnosis not present

## 2022-09-10 DIAGNOSIS — C50912 Malignant neoplasm of unspecified site of left female breast: Secondary | ICD-10-CM | POA: Diagnosis not present

## 2022-09-10 DIAGNOSIS — K219 Gastro-esophageal reflux disease without esophagitis: Secondary | ICD-10-CM | POA: Diagnosis not present

## 2022-09-10 DIAGNOSIS — C7951 Secondary malignant neoplasm of bone: Secondary | ICD-10-CM | POA: Diagnosis not present

## 2022-09-10 DIAGNOSIS — C50211 Malignant neoplasm of upper-inner quadrant of right female breast: Secondary | ICD-10-CM | POA: Diagnosis not present

## 2022-09-10 DIAGNOSIS — C787 Secondary malignant neoplasm of liver and intrahepatic bile duct: Secondary | ICD-10-CM | POA: Diagnosis not present

## 2022-09-11 DIAGNOSIS — F319 Bipolar disorder, unspecified: Secondary | ICD-10-CM | POA: Diagnosis not present

## 2022-09-11 DIAGNOSIS — C50211 Malignant neoplasm of upper-inner quadrant of right female breast: Secondary | ICD-10-CM | POA: Diagnosis not present

## 2022-09-11 DIAGNOSIS — C7951 Secondary malignant neoplasm of bone: Secondary | ICD-10-CM | POA: Diagnosis not present

## 2022-09-11 DIAGNOSIS — Z170 Estrogen receptor positive status [ER+]: Secondary | ICD-10-CM | POA: Diagnosis not present

## 2022-09-11 DIAGNOSIS — K831 Obstruction of bile duct: Secondary | ICD-10-CM | POA: Diagnosis not present

## 2022-09-11 DIAGNOSIS — C50912 Malignant neoplasm of unspecified site of left female breast: Secondary | ICD-10-CM | POA: Diagnosis not present

## 2022-09-11 DIAGNOSIS — K219 Gastro-esophageal reflux disease without esophagitis: Secondary | ICD-10-CM | POA: Diagnosis not present

## 2022-09-11 DIAGNOSIS — I10 Essential (primary) hypertension: Secondary | ICD-10-CM | POA: Diagnosis not present

## 2022-09-11 DIAGNOSIS — C787 Secondary malignant neoplasm of liver and intrahepatic bile duct: Secondary | ICD-10-CM | POA: Diagnosis not present

## 2022-09-11 DIAGNOSIS — F32A Depression, unspecified: Secondary | ICD-10-CM | POA: Diagnosis not present

## 2022-09-11 IMAGING — CT CT CHEST W/ CM
2 of 4 series · 15 of 36 positions shown, 18 images · IV contrast (OMNIPAQUE)
Comparison: 09/03/2020

CLINICAL DATA: Metastatic breast cancer, status post bilateral
mastectomy, chemotherapy in progress, XRT complete

EXAM:
CT CHEST WITH CONTRAST
TECHNIQUE: Multidetector CT imaging of the chest was performed during
intravenous contrast administration.
CONTRAST:  60mL OMNIPAQUE IOHEXOL 350 MG/ML SOLN

[Series 2: axial st · axial · 0.76mm/px · z∈[-324,-36]mm · 12 of 169 slices shown, 15 images]
[im 13/169  mediastinal]
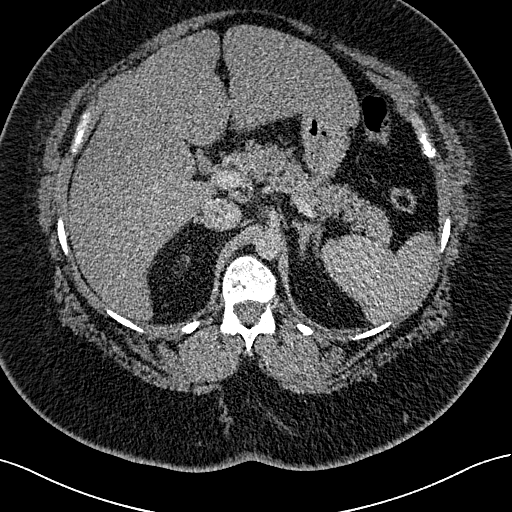
[im 13/169  lung]
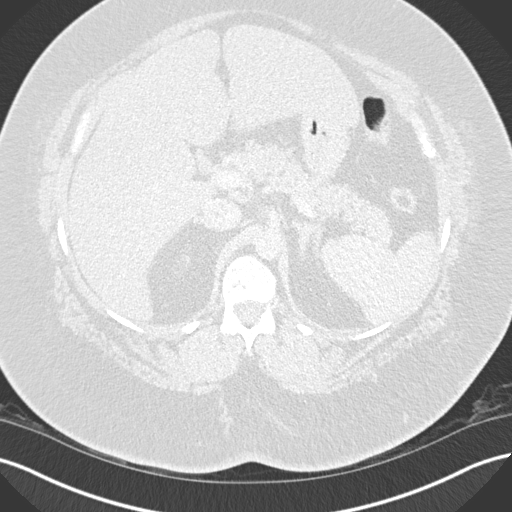
[im 25/169  lung]
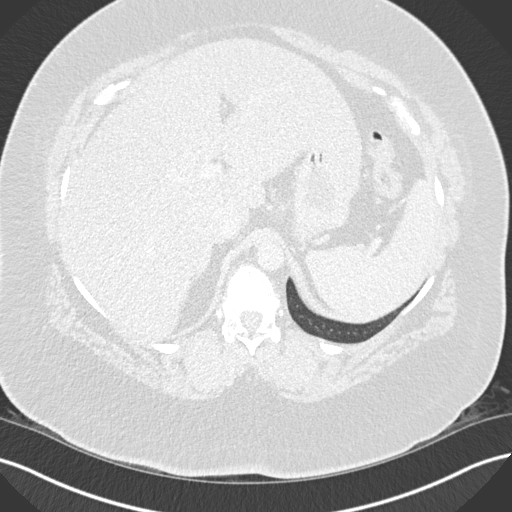
[im 37/169  lung]
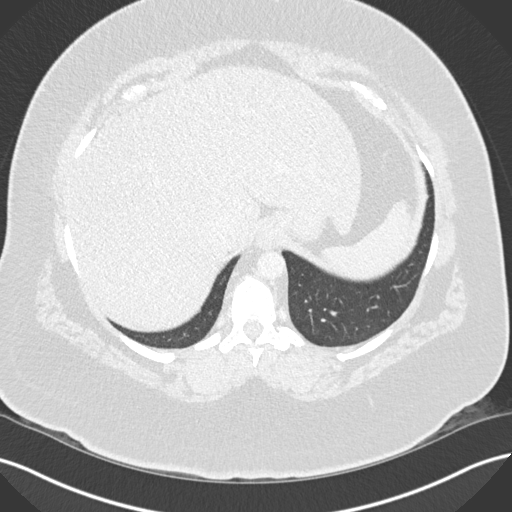
[im 49/169  lung]
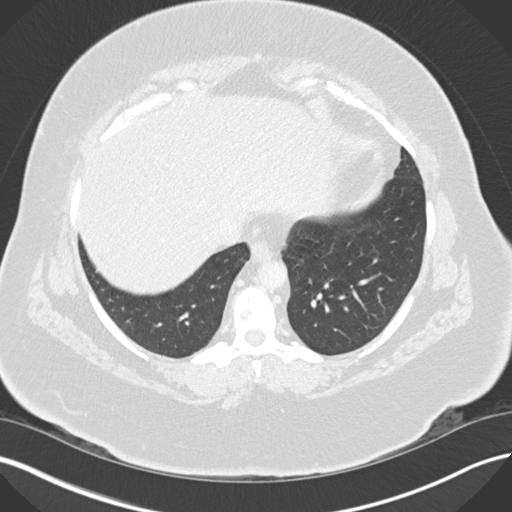
[im 61/169  mediastinal]
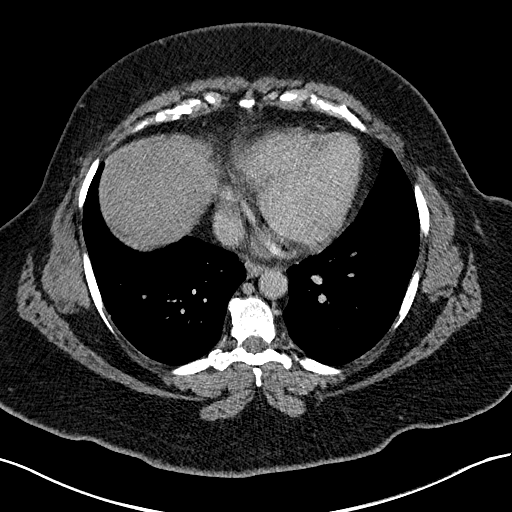
[im 61/169  lung]
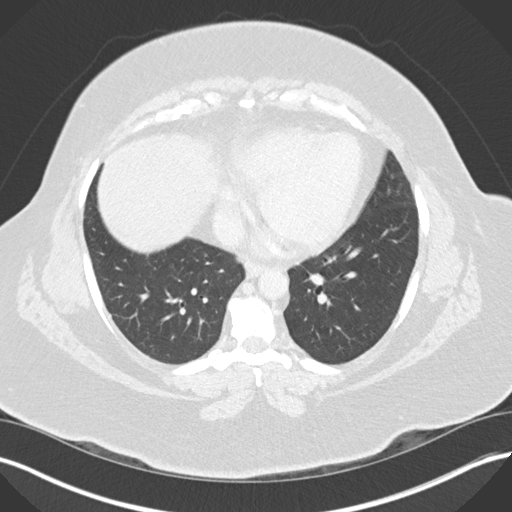
[im 73/169  lung]
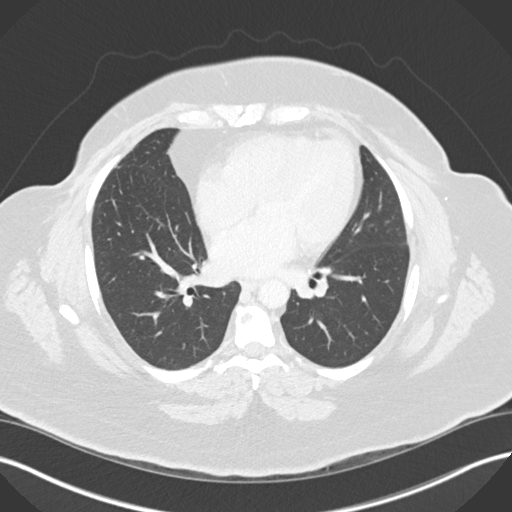
[im 97/169  lung]
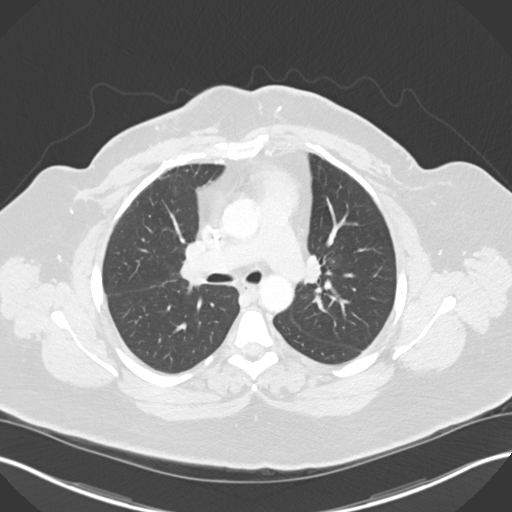
[im 109/169  lung]
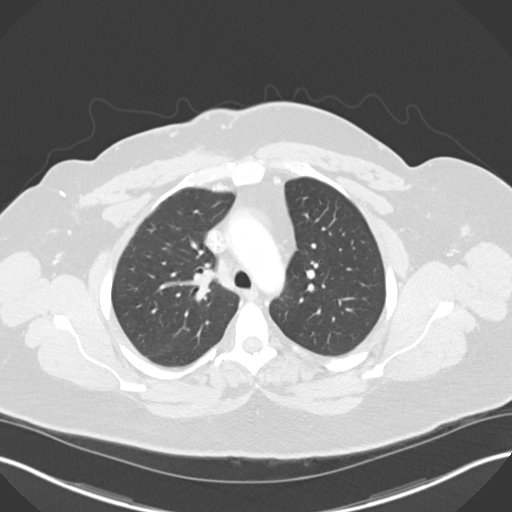
[im 121/169  mediastinal]
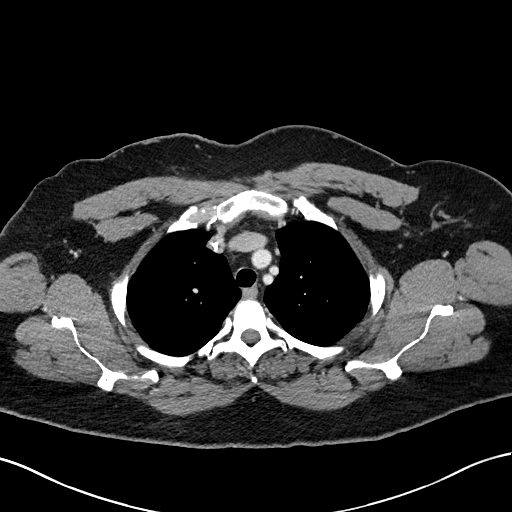
[im 121/169  lung]
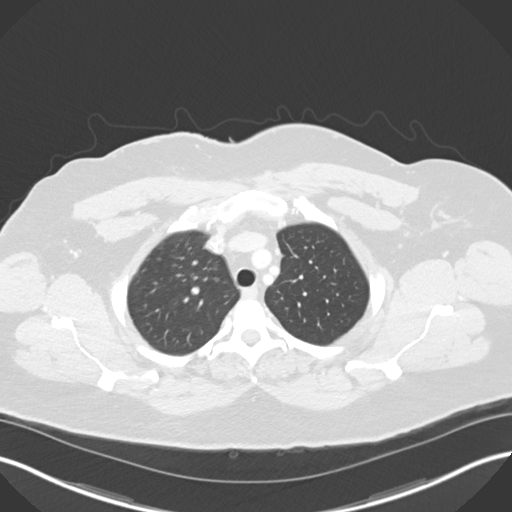
[im 133/169  lung]
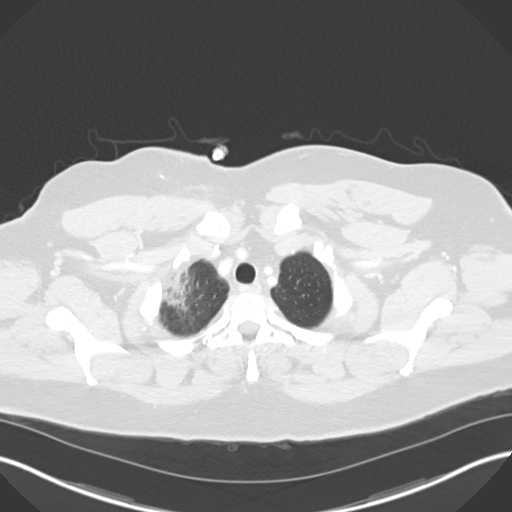
[im 145/169  lung]
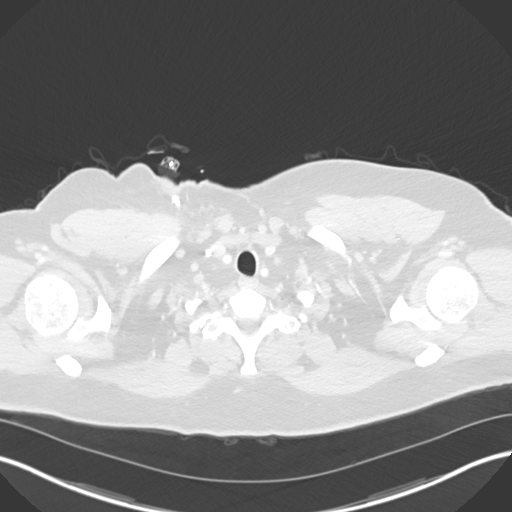
[im 157/169  lung]
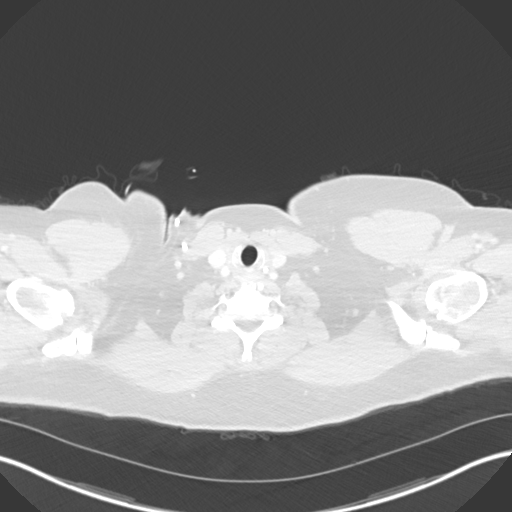

[Series 5: coronal · coronal · 0.66mm/px · 3 of 156 slices shown]
[im 32/156  lung]
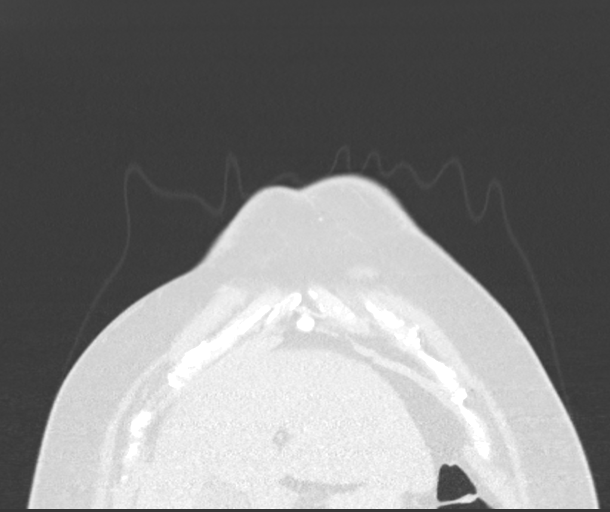
[im 63/156  lung]
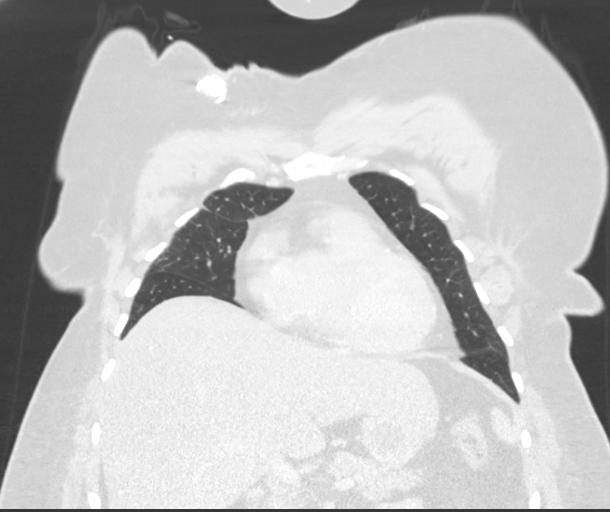
[im 94/156  lung]
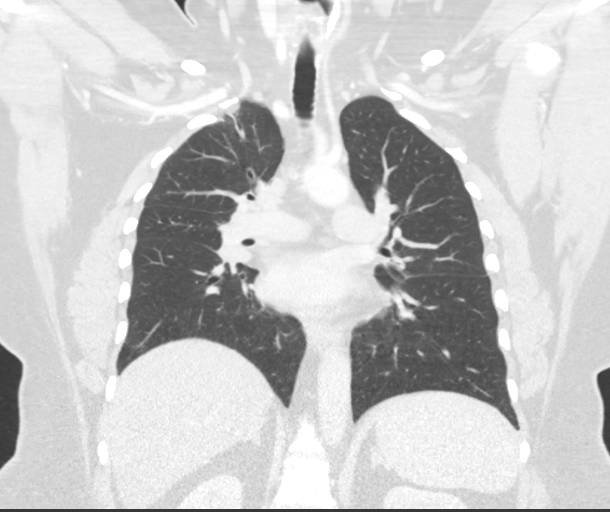

[15 of 36 positions shown; findings below may reference images not displayed]

FINDINGS: Cardiovascular: Heart is normal in size.  No pericardial effusion.

No evidence of thoracic aortic aneurysm.

Right chest port terminates at the cavoatrial junction.

Mediastinum/Nodes: 16 mm short axis subcarinal node (series 2/image
82), previously 17 mm.

12 mm short axis right perihilar node (series 2/image 84),
unchanged.

Status post right axillary lymph node dissection.

Visualized thyroid is unremarkable.

Lungs/Pleura: Radiation changes in the anterior right lung apex
(series 7/image 37).

Prior bilateral pulmonary nodules have essentially resolved.

4 mm nodule in the inferolateral right middle lobe (series 7/image
94), unchanged, favored to reflect nodular scarring.

Additional flat perifissural nodularity along the left fissure
(series 7/image 86), unchanged/benign. Similar perifissural
nodularity along the right major fissure measuring up to 5 mm
(series 7/image 85), unchanged, favored to be benign.

4 mm subpleural vague ground-glass nodule in the posterior left
upper lobe (series 7/image 83), possibly new, but not considered
suspicious for metastatic disease.

No pleural effusion or pneumothorax.

Upper Abdomen: Hepatic metastases in the right hepatic lobe (series
2/images 163 and 169) are less conspicuous than on prior CT and may
be improved.

Musculoskeletal: Status post bilateral mastectomy.

Multifocal lytic/sclerotic metastases in the visualized axial and
appendicular skeleton, grossly unchanged.
IMPRESSION: Status post bilateral mastectomy with right axillary lymph node
dissection.

Small bilateral pulmonary nodules/metastases have essentially
resolved.

Mediastinal/right perihilar nodal metastases are grossly unchanged.

Hepatic metastases are poorly visualized/evaluated but favored to be
improved.

Multifocal osseous metastases, grossly unchanged.

## 2022-09-12 DIAGNOSIS — K831 Obstruction of bile duct: Secondary | ICD-10-CM | POA: Diagnosis not present

## 2022-09-12 DIAGNOSIS — K219 Gastro-esophageal reflux disease without esophagitis: Secondary | ICD-10-CM | POA: Diagnosis not present

## 2022-09-12 DIAGNOSIS — C50912 Malignant neoplasm of unspecified site of left female breast: Secondary | ICD-10-CM | POA: Diagnosis not present

## 2022-09-12 DIAGNOSIS — Z170 Estrogen receptor positive status [ER+]: Secondary | ICD-10-CM | POA: Diagnosis not present

## 2022-09-12 DIAGNOSIS — C7951 Secondary malignant neoplasm of bone: Secondary | ICD-10-CM | POA: Diagnosis not present

## 2022-09-12 DIAGNOSIS — F319 Bipolar disorder, unspecified: Secondary | ICD-10-CM | POA: Diagnosis not present

## 2022-09-12 DIAGNOSIS — F32A Depression, unspecified: Secondary | ICD-10-CM | POA: Diagnosis not present

## 2022-09-12 DIAGNOSIS — C50211 Malignant neoplasm of upper-inner quadrant of right female breast: Secondary | ICD-10-CM | POA: Diagnosis not present

## 2022-09-12 DIAGNOSIS — C787 Secondary malignant neoplasm of liver and intrahepatic bile duct: Secondary | ICD-10-CM | POA: Diagnosis not present

## 2022-09-12 DIAGNOSIS — I10 Essential (primary) hypertension: Secondary | ICD-10-CM | POA: Diagnosis not present

## 2022-09-13 DIAGNOSIS — Z170 Estrogen receptor positive status [ER+]: Secondary | ICD-10-CM | POA: Diagnosis not present

## 2022-09-13 DIAGNOSIS — C50912 Malignant neoplasm of unspecified site of left female breast: Secondary | ICD-10-CM | POA: Diagnosis not present

## 2022-09-13 DIAGNOSIS — I10 Essential (primary) hypertension: Secondary | ICD-10-CM | POA: Diagnosis not present

## 2022-09-13 DIAGNOSIS — C50211 Malignant neoplasm of upper-inner quadrant of right female breast: Secondary | ICD-10-CM | POA: Diagnosis not present

## 2022-09-13 DIAGNOSIS — F319 Bipolar disorder, unspecified: Secondary | ICD-10-CM | POA: Diagnosis not present

## 2022-09-13 DIAGNOSIS — K219 Gastro-esophageal reflux disease without esophagitis: Secondary | ICD-10-CM | POA: Diagnosis not present

## 2022-09-13 DIAGNOSIS — K831 Obstruction of bile duct: Secondary | ICD-10-CM | POA: Diagnosis not present

## 2022-09-13 DIAGNOSIS — C7951 Secondary malignant neoplasm of bone: Secondary | ICD-10-CM | POA: Diagnosis not present

## 2022-09-13 DIAGNOSIS — F32A Depression, unspecified: Secondary | ICD-10-CM | POA: Diagnosis not present

## 2022-09-13 DIAGNOSIS — C787 Secondary malignant neoplasm of liver and intrahepatic bile duct: Secondary | ICD-10-CM | POA: Diagnosis not present

## 2022-09-14 DIAGNOSIS — C50912 Malignant neoplasm of unspecified site of left female breast: Secondary | ICD-10-CM | POA: Diagnosis not present

## 2022-09-14 DIAGNOSIS — Z170 Estrogen receptor positive status [ER+]: Secondary | ICD-10-CM | POA: Diagnosis not present

## 2022-09-14 DIAGNOSIS — C7951 Secondary malignant neoplasm of bone: Secondary | ICD-10-CM | POA: Diagnosis not present

## 2022-09-14 DIAGNOSIS — C787 Secondary malignant neoplasm of liver and intrahepatic bile duct: Secondary | ICD-10-CM | POA: Diagnosis not present

## 2022-09-14 DIAGNOSIS — K831 Obstruction of bile duct: Secondary | ICD-10-CM | POA: Diagnosis not present

## 2022-09-14 DIAGNOSIS — C50211 Malignant neoplasm of upper-inner quadrant of right female breast: Secondary | ICD-10-CM | POA: Diagnosis not present

## 2022-09-14 DIAGNOSIS — F32A Depression, unspecified: Secondary | ICD-10-CM | POA: Diagnosis not present

## 2022-09-14 DIAGNOSIS — F319 Bipolar disorder, unspecified: Secondary | ICD-10-CM | POA: Diagnosis not present

## 2022-09-14 DIAGNOSIS — I10 Essential (primary) hypertension: Secondary | ICD-10-CM | POA: Diagnosis not present

## 2022-09-14 DIAGNOSIS — K219 Gastro-esophageal reflux disease without esophagitis: Secondary | ICD-10-CM | POA: Diagnosis not present

## 2022-09-15 DIAGNOSIS — K219 Gastro-esophageal reflux disease without esophagitis: Secondary | ICD-10-CM | POA: Diagnosis not present

## 2022-09-15 DIAGNOSIS — C787 Secondary malignant neoplasm of liver and intrahepatic bile duct: Secondary | ICD-10-CM | POA: Diagnosis not present

## 2022-09-15 DIAGNOSIS — Z170 Estrogen receptor positive status [ER+]: Secondary | ICD-10-CM | POA: Diagnosis not present

## 2022-09-15 DIAGNOSIS — C50211 Malignant neoplasm of upper-inner quadrant of right female breast: Secondary | ICD-10-CM | POA: Diagnosis not present

## 2022-09-15 DIAGNOSIS — K831 Obstruction of bile duct: Secondary | ICD-10-CM | POA: Diagnosis not present

## 2022-09-15 DIAGNOSIS — C50912 Malignant neoplasm of unspecified site of left female breast: Secondary | ICD-10-CM | POA: Diagnosis not present

## 2022-09-15 DIAGNOSIS — C7951 Secondary malignant neoplasm of bone: Secondary | ICD-10-CM | POA: Diagnosis not present

## 2022-09-15 DIAGNOSIS — F32A Depression, unspecified: Secondary | ICD-10-CM | POA: Diagnosis not present

## 2022-09-15 DIAGNOSIS — I10 Essential (primary) hypertension: Secondary | ICD-10-CM | POA: Diagnosis not present

## 2022-09-15 DIAGNOSIS — F319 Bipolar disorder, unspecified: Secondary | ICD-10-CM | POA: Diagnosis not present

## 2022-09-16 DIAGNOSIS — C7951 Secondary malignant neoplasm of bone: Secondary | ICD-10-CM | POA: Diagnosis not present

## 2022-09-16 DIAGNOSIS — Z170 Estrogen receptor positive status [ER+]: Secondary | ICD-10-CM | POA: Diagnosis not present

## 2022-09-16 DIAGNOSIS — F32A Depression, unspecified: Secondary | ICD-10-CM | POA: Diagnosis not present

## 2022-09-16 DIAGNOSIS — C50912 Malignant neoplasm of unspecified site of left female breast: Secondary | ICD-10-CM | POA: Diagnosis not present

## 2022-09-16 DIAGNOSIS — K831 Obstruction of bile duct: Secondary | ICD-10-CM | POA: Diagnosis not present

## 2022-09-16 DIAGNOSIS — C50211 Malignant neoplasm of upper-inner quadrant of right female breast: Secondary | ICD-10-CM | POA: Diagnosis not present

## 2022-09-16 DIAGNOSIS — K219 Gastro-esophageal reflux disease without esophagitis: Secondary | ICD-10-CM | POA: Diagnosis not present

## 2022-09-16 DIAGNOSIS — C787 Secondary malignant neoplasm of liver and intrahepatic bile duct: Secondary | ICD-10-CM | POA: Diagnosis not present

## 2022-09-16 DIAGNOSIS — I10 Essential (primary) hypertension: Secondary | ICD-10-CM | POA: Diagnosis not present

## 2022-09-16 DIAGNOSIS — F319 Bipolar disorder, unspecified: Secondary | ICD-10-CM | POA: Diagnosis not present

## 2022-09-17 ENCOUNTER — Ambulatory Visit: Payer: Commercial Managed Care - PPO

## 2022-09-17 ENCOUNTER — Other Ambulatory Visit: Payer: Commercial Managed Care - PPO

## 2022-09-17 DIAGNOSIS — Z170 Estrogen receptor positive status [ER+]: Secondary | ICD-10-CM | POA: Diagnosis not present

## 2022-09-17 DIAGNOSIS — C787 Secondary malignant neoplasm of liver and intrahepatic bile duct: Secondary | ICD-10-CM | POA: Diagnosis not present

## 2022-09-17 DIAGNOSIS — F32A Depression, unspecified: Secondary | ICD-10-CM | POA: Diagnosis not present

## 2022-09-17 DIAGNOSIS — K831 Obstruction of bile duct: Secondary | ICD-10-CM | POA: Diagnosis not present

## 2022-09-17 DIAGNOSIS — C50912 Malignant neoplasm of unspecified site of left female breast: Secondary | ICD-10-CM | POA: Diagnosis not present

## 2022-09-17 DIAGNOSIS — K219 Gastro-esophageal reflux disease without esophagitis: Secondary | ICD-10-CM | POA: Diagnosis not present

## 2022-09-17 DIAGNOSIS — C7951 Secondary malignant neoplasm of bone: Secondary | ICD-10-CM | POA: Diagnosis not present

## 2022-09-17 DIAGNOSIS — C50211 Malignant neoplasm of upper-inner quadrant of right female breast: Secondary | ICD-10-CM | POA: Diagnosis not present

## 2022-09-17 DIAGNOSIS — I10 Essential (primary) hypertension: Secondary | ICD-10-CM | POA: Diagnosis not present

## 2022-09-17 DIAGNOSIS — F319 Bipolar disorder, unspecified: Secondary | ICD-10-CM | POA: Diagnosis not present

## 2022-09-18 DIAGNOSIS — C7951 Secondary malignant neoplasm of bone: Secondary | ICD-10-CM | POA: Diagnosis not present

## 2022-09-18 DIAGNOSIS — I10 Essential (primary) hypertension: Secondary | ICD-10-CM | POA: Diagnosis not present

## 2022-09-18 DIAGNOSIS — C787 Secondary malignant neoplasm of liver and intrahepatic bile duct: Secondary | ICD-10-CM | POA: Diagnosis not present

## 2022-09-18 DIAGNOSIS — F32A Depression, unspecified: Secondary | ICD-10-CM | POA: Diagnosis not present

## 2022-09-18 DIAGNOSIS — F319 Bipolar disorder, unspecified: Secondary | ICD-10-CM | POA: Diagnosis not present

## 2022-09-18 DIAGNOSIS — C50211 Malignant neoplasm of upper-inner quadrant of right female breast: Secondary | ICD-10-CM | POA: Diagnosis not present

## 2022-09-18 DIAGNOSIS — K219 Gastro-esophageal reflux disease without esophagitis: Secondary | ICD-10-CM | POA: Diagnosis not present

## 2022-09-18 DIAGNOSIS — C50912 Malignant neoplasm of unspecified site of left female breast: Secondary | ICD-10-CM | POA: Diagnosis not present

## 2022-09-18 DIAGNOSIS — Z170 Estrogen receptor positive status [ER+]: Secondary | ICD-10-CM | POA: Diagnosis not present

## 2022-09-18 DIAGNOSIS — K831 Obstruction of bile duct: Secondary | ICD-10-CM | POA: Diagnosis not present

## 2022-09-19 DIAGNOSIS — C50211 Malignant neoplasm of upper-inner quadrant of right female breast: Secondary | ICD-10-CM | POA: Diagnosis not present

## 2022-09-19 DIAGNOSIS — F32A Depression, unspecified: Secondary | ICD-10-CM | POA: Diagnosis not present

## 2022-09-19 DIAGNOSIS — K831 Obstruction of bile duct: Secondary | ICD-10-CM | POA: Diagnosis not present

## 2022-09-19 DIAGNOSIS — C787 Secondary malignant neoplasm of liver and intrahepatic bile duct: Secondary | ICD-10-CM | POA: Diagnosis not present

## 2022-09-19 DIAGNOSIS — C50912 Malignant neoplasm of unspecified site of left female breast: Secondary | ICD-10-CM | POA: Diagnosis not present

## 2022-09-19 DIAGNOSIS — F319 Bipolar disorder, unspecified: Secondary | ICD-10-CM | POA: Diagnosis not present

## 2022-09-19 DIAGNOSIS — I10 Essential (primary) hypertension: Secondary | ICD-10-CM | POA: Diagnosis not present

## 2022-09-19 DIAGNOSIS — K219 Gastro-esophageal reflux disease without esophagitis: Secondary | ICD-10-CM | POA: Diagnosis not present

## 2022-09-19 DIAGNOSIS — Z170 Estrogen receptor positive status [ER+]: Secondary | ICD-10-CM | POA: Diagnosis not present

## 2022-09-19 DIAGNOSIS — C7951 Secondary malignant neoplasm of bone: Secondary | ICD-10-CM | POA: Diagnosis not present

## 2022-09-20 DIAGNOSIS — K219 Gastro-esophageal reflux disease without esophagitis: Secondary | ICD-10-CM | POA: Diagnosis not present

## 2022-09-20 DIAGNOSIS — I10 Essential (primary) hypertension: Secondary | ICD-10-CM | POA: Diagnosis not present

## 2022-09-20 DIAGNOSIS — Z170 Estrogen receptor positive status [ER+]: Secondary | ICD-10-CM | POA: Diagnosis not present

## 2022-09-20 DIAGNOSIS — C787 Secondary malignant neoplasm of liver and intrahepatic bile duct: Secondary | ICD-10-CM | POA: Diagnosis not present

## 2022-09-20 DIAGNOSIS — C50211 Malignant neoplasm of upper-inner quadrant of right female breast: Secondary | ICD-10-CM | POA: Diagnosis not present

## 2022-09-20 DIAGNOSIS — F319 Bipolar disorder, unspecified: Secondary | ICD-10-CM | POA: Diagnosis not present

## 2022-09-20 DIAGNOSIS — C7951 Secondary malignant neoplasm of bone: Secondary | ICD-10-CM | POA: Diagnosis not present

## 2022-09-20 DIAGNOSIS — K831 Obstruction of bile duct: Secondary | ICD-10-CM | POA: Diagnosis not present

## 2022-09-20 DIAGNOSIS — C50912 Malignant neoplasm of unspecified site of left female breast: Secondary | ICD-10-CM | POA: Diagnosis not present

## 2022-09-20 DIAGNOSIS — F32A Depression, unspecified: Secondary | ICD-10-CM | POA: Diagnosis not present

## 2022-09-21 DIAGNOSIS — C787 Secondary malignant neoplasm of liver and intrahepatic bile duct: Secondary | ICD-10-CM | POA: Diagnosis not present

## 2022-09-21 DIAGNOSIS — K219 Gastro-esophageal reflux disease without esophagitis: Secondary | ICD-10-CM | POA: Diagnosis not present

## 2022-09-21 DIAGNOSIS — C7951 Secondary malignant neoplasm of bone: Secondary | ICD-10-CM | POA: Diagnosis not present

## 2022-09-21 DIAGNOSIS — F32A Depression, unspecified: Secondary | ICD-10-CM | POA: Diagnosis not present

## 2022-09-21 DIAGNOSIS — C50211 Malignant neoplasm of upper-inner quadrant of right female breast: Secondary | ICD-10-CM | POA: Diagnosis not present

## 2022-09-21 DIAGNOSIS — K831 Obstruction of bile duct: Secondary | ICD-10-CM | POA: Diagnosis not present

## 2022-09-21 DIAGNOSIS — F319 Bipolar disorder, unspecified: Secondary | ICD-10-CM | POA: Diagnosis not present

## 2022-09-21 DIAGNOSIS — I10 Essential (primary) hypertension: Secondary | ICD-10-CM | POA: Diagnosis not present

## 2022-09-21 DIAGNOSIS — Z170 Estrogen receptor positive status [ER+]: Secondary | ICD-10-CM | POA: Diagnosis not present

## 2022-09-21 DIAGNOSIS — C50912 Malignant neoplasm of unspecified site of left female breast: Secondary | ICD-10-CM | POA: Diagnosis not present

## 2022-09-22 DIAGNOSIS — I10 Essential (primary) hypertension: Secondary | ICD-10-CM | POA: Diagnosis not present

## 2022-09-22 DIAGNOSIS — C787 Secondary malignant neoplasm of liver and intrahepatic bile duct: Secondary | ICD-10-CM | POA: Diagnosis not present

## 2022-09-22 DIAGNOSIS — C50912 Malignant neoplasm of unspecified site of left female breast: Secondary | ICD-10-CM | POA: Diagnosis not present

## 2022-09-22 DIAGNOSIS — K831 Obstruction of bile duct: Secondary | ICD-10-CM | POA: Diagnosis not present

## 2022-09-22 DIAGNOSIS — C50211 Malignant neoplasm of upper-inner quadrant of right female breast: Secondary | ICD-10-CM | POA: Diagnosis not present

## 2022-09-22 DIAGNOSIS — F319 Bipolar disorder, unspecified: Secondary | ICD-10-CM | POA: Diagnosis not present

## 2022-09-22 DIAGNOSIS — K219 Gastro-esophageal reflux disease without esophagitis: Secondary | ICD-10-CM | POA: Diagnosis not present

## 2022-09-22 DIAGNOSIS — F32A Depression, unspecified: Secondary | ICD-10-CM | POA: Diagnosis not present

## 2022-09-22 DIAGNOSIS — Z170 Estrogen receptor positive status [ER+]: Secondary | ICD-10-CM | POA: Diagnosis not present

## 2022-09-22 DIAGNOSIS — C7951 Secondary malignant neoplasm of bone: Secondary | ICD-10-CM | POA: Diagnosis not present

## 2022-09-23 DIAGNOSIS — K831 Obstruction of bile duct: Secondary | ICD-10-CM | POA: Diagnosis not present

## 2022-09-23 DIAGNOSIS — F319 Bipolar disorder, unspecified: Secondary | ICD-10-CM | POA: Diagnosis not present

## 2022-09-23 DIAGNOSIS — C787 Secondary malignant neoplasm of liver and intrahepatic bile duct: Secondary | ICD-10-CM | POA: Diagnosis not present

## 2022-09-23 DIAGNOSIS — F32A Depression, unspecified: Secondary | ICD-10-CM | POA: Diagnosis not present

## 2022-09-23 DIAGNOSIS — C50912 Malignant neoplasm of unspecified site of left female breast: Secondary | ICD-10-CM | POA: Diagnosis not present

## 2022-09-23 DIAGNOSIS — C50211 Malignant neoplasm of upper-inner quadrant of right female breast: Secondary | ICD-10-CM | POA: Diagnosis not present

## 2022-09-23 DIAGNOSIS — I10 Essential (primary) hypertension: Secondary | ICD-10-CM | POA: Diagnosis not present

## 2022-09-23 DIAGNOSIS — C7951 Secondary malignant neoplasm of bone: Secondary | ICD-10-CM | POA: Diagnosis not present

## 2022-09-23 DIAGNOSIS — Z170 Estrogen receptor positive status [ER+]: Secondary | ICD-10-CM | POA: Diagnosis not present

## 2022-09-23 DIAGNOSIS — K219 Gastro-esophageal reflux disease without esophagitis: Secondary | ICD-10-CM | POA: Diagnosis not present

## 2022-09-24 DIAGNOSIS — C7951 Secondary malignant neoplasm of bone: Secondary | ICD-10-CM | POA: Diagnosis not present

## 2022-09-24 DIAGNOSIS — K219 Gastro-esophageal reflux disease without esophagitis: Secondary | ICD-10-CM | POA: Diagnosis not present

## 2022-09-24 DIAGNOSIS — F32A Depression, unspecified: Secondary | ICD-10-CM | POA: Diagnosis not present

## 2022-09-24 DIAGNOSIS — C50211 Malignant neoplasm of upper-inner quadrant of right female breast: Secondary | ICD-10-CM | POA: Diagnosis not present

## 2022-09-24 DIAGNOSIS — Z170 Estrogen receptor positive status [ER+]: Secondary | ICD-10-CM | POA: Diagnosis not present

## 2022-09-24 DIAGNOSIS — C787 Secondary malignant neoplasm of liver and intrahepatic bile duct: Secondary | ICD-10-CM | POA: Diagnosis not present

## 2022-09-24 DIAGNOSIS — I10 Essential (primary) hypertension: Secondary | ICD-10-CM | POA: Diagnosis not present

## 2022-09-24 DIAGNOSIS — K831 Obstruction of bile duct: Secondary | ICD-10-CM | POA: Diagnosis not present

## 2022-09-24 DIAGNOSIS — F319 Bipolar disorder, unspecified: Secondary | ICD-10-CM | POA: Diagnosis not present

## 2022-09-24 DIAGNOSIS — C50912 Malignant neoplasm of unspecified site of left female breast: Secondary | ICD-10-CM | POA: Diagnosis not present

## 2022-09-25 DIAGNOSIS — Z170 Estrogen receptor positive status [ER+]: Secondary | ICD-10-CM | POA: Diagnosis not present

## 2022-09-25 DIAGNOSIS — K219 Gastro-esophageal reflux disease without esophagitis: Secondary | ICD-10-CM | POA: Diagnosis not present

## 2022-09-25 DIAGNOSIS — C50211 Malignant neoplasm of upper-inner quadrant of right female breast: Secondary | ICD-10-CM | POA: Diagnosis not present

## 2022-09-25 DIAGNOSIS — F32A Depression, unspecified: Secondary | ICD-10-CM | POA: Diagnosis not present

## 2022-09-25 DIAGNOSIS — C50912 Malignant neoplasm of unspecified site of left female breast: Secondary | ICD-10-CM | POA: Diagnosis not present

## 2022-09-25 DIAGNOSIS — C787 Secondary malignant neoplasm of liver and intrahepatic bile duct: Secondary | ICD-10-CM | POA: Diagnosis not present

## 2022-09-25 DIAGNOSIS — F319 Bipolar disorder, unspecified: Secondary | ICD-10-CM | POA: Diagnosis not present

## 2022-09-25 DIAGNOSIS — I10 Essential (primary) hypertension: Secondary | ICD-10-CM | POA: Diagnosis not present

## 2022-09-25 DIAGNOSIS — K831 Obstruction of bile duct: Secondary | ICD-10-CM | POA: Diagnosis not present

## 2022-09-25 DIAGNOSIS — C7951 Secondary malignant neoplasm of bone: Secondary | ICD-10-CM | POA: Diagnosis not present

## 2022-09-26 DIAGNOSIS — C50912 Malignant neoplasm of unspecified site of left female breast: Secondary | ICD-10-CM | POA: Diagnosis not present

## 2022-09-26 DIAGNOSIS — F319 Bipolar disorder, unspecified: Secondary | ICD-10-CM | POA: Diagnosis not present

## 2022-09-26 DIAGNOSIS — I10 Essential (primary) hypertension: Secondary | ICD-10-CM | POA: Diagnosis not present

## 2022-09-26 DIAGNOSIS — K831 Obstruction of bile duct: Secondary | ICD-10-CM | POA: Diagnosis not present

## 2022-09-26 DIAGNOSIS — C787 Secondary malignant neoplasm of liver and intrahepatic bile duct: Secondary | ICD-10-CM | POA: Diagnosis not present

## 2022-09-26 DIAGNOSIS — K219 Gastro-esophageal reflux disease without esophagitis: Secondary | ICD-10-CM | POA: Diagnosis not present

## 2022-09-26 DIAGNOSIS — F32A Depression, unspecified: Secondary | ICD-10-CM | POA: Diagnosis not present

## 2022-09-26 DIAGNOSIS — C7951 Secondary malignant neoplasm of bone: Secondary | ICD-10-CM | POA: Diagnosis not present

## 2022-09-26 DIAGNOSIS — Z170 Estrogen receptor positive status [ER+]: Secondary | ICD-10-CM | POA: Diagnosis not present

## 2022-09-26 DIAGNOSIS — C50211 Malignant neoplasm of upper-inner quadrant of right female breast: Secondary | ICD-10-CM | POA: Diagnosis not present

## 2022-09-27 DIAGNOSIS — C787 Secondary malignant neoplasm of liver and intrahepatic bile duct: Secondary | ICD-10-CM | POA: Diagnosis not present

## 2022-09-27 DIAGNOSIS — K219 Gastro-esophageal reflux disease without esophagitis: Secondary | ICD-10-CM | POA: Diagnosis not present

## 2022-09-27 DIAGNOSIS — K831 Obstruction of bile duct: Secondary | ICD-10-CM | POA: Diagnosis not present

## 2022-09-27 DIAGNOSIS — F32A Depression, unspecified: Secondary | ICD-10-CM | POA: Diagnosis not present

## 2022-09-27 DIAGNOSIS — C7951 Secondary malignant neoplasm of bone: Secondary | ICD-10-CM | POA: Diagnosis not present

## 2022-09-27 DIAGNOSIS — C50211 Malignant neoplasm of upper-inner quadrant of right female breast: Secondary | ICD-10-CM | POA: Diagnosis not present

## 2022-09-27 DIAGNOSIS — C50912 Malignant neoplasm of unspecified site of left female breast: Secondary | ICD-10-CM | POA: Diagnosis not present

## 2022-09-27 DIAGNOSIS — F319 Bipolar disorder, unspecified: Secondary | ICD-10-CM | POA: Diagnosis not present

## 2022-09-27 DIAGNOSIS — Z170 Estrogen receptor positive status [ER+]: Secondary | ICD-10-CM | POA: Diagnosis not present

## 2022-09-27 DIAGNOSIS — I10 Essential (primary) hypertension: Secondary | ICD-10-CM | POA: Diagnosis not present

## 2022-09-28 DIAGNOSIS — R109 Unspecified abdominal pain: Secondary | ICD-10-CM | POA: Diagnosis not present

## 2022-09-28 DIAGNOSIS — R112 Nausea with vomiting, unspecified: Secondary | ICD-10-CM | POA: Diagnosis not present

## 2022-09-28 DIAGNOSIS — C50211 Malignant neoplasm of upper-inner quadrant of right female breast: Secondary | ICD-10-CM | POA: Diagnosis not present

## 2022-09-28 DIAGNOSIS — N179 Acute kidney failure, unspecified: Secondary | ICD-10-CM | POA: Diagnosis not present

## 2022-09-28 DIAGNOSIS — C50912 Malignant neoplasm of unspecified site of left female breast: Secondary | ICD-10-CM | POA: Diagnosis not present

## 2022-09-28 DIAGNOSIS — C787 Secondary malignant neoplasm of liver and intrahepatic bile duct: Secondary | ICD-10-CM | POA: Diagnosis not present

## 2022-09-28 DIAGNOSIS — I10 Essential (primary) hypertension: Secondary | ICD-10-CM | POA: Diagnosis not present

## 2022-09-28 DIAGNOSIS — K219 Gastro-esophageal reflux disease without esophagitis: Secondary | ICD-10-CM | POA: Diagnosis not present

## 2022-09-28 DIAGNOSIS — K831 Obstruction of bile duct: Secondary | ICD-10-CM | POA: Diagnosis not present

## 2022-09-28 DIAGNOSIS — C7951 Secondary malignant neoplasm of bone: Secondary | ICD-10-CM | POA: Diagnosis not present

## 2022-09-28 DIAGNOSIS — R1111 Vomiting without nausea: Secondary | ICD-10-CM | POA: Diagnosis not present

## 2022-09-28 DIAGNOSIS — F319 Bipolar disorder, unspecified: Secondary | ICD-10-CM | POA: Diagnosis not present

## 2022-09-28 DIAGNOSIS — K828 Other specified diseases of gallbladder: Secondary | ICD-10-CM | POA: Diagnosis not present

## 2022-09-28 DIAGNOSIS — F32A Depression, unspecified: Secondary | ICD-10-CM | POA: Diagnosis not present

## 2022-09-28 DIAGNOSIS — C50919 Malignant neoplasm of unspecified site of unspecified female breast: Secondary | ICD-10-CM | POA: Diagnosis not present

## 2022-09-28 DIAGNOSIS — I959 Hypotension, unspecified: Secondary | ICD-10-CM | POA: Diagnosis not present

## 2022-09-28 DIAGNOSIS — R110 Nausea: Secondary | ICD-10-CM | POA: Diagnosis not present

## 2022-09-28 DIAGNOSIS — Z170 Estrogen receptor positive status [ER+]: Secondary | ICD-10-CM | POA: Diagnosis not present

## 2022-09-29 DIAGNOSIS — C7951 Secondary malignant neoplasm of bone: Secondary | ICD-10-CM | POA: Diagnosis not present

## 2022-09-29 DIAGNOSIS — I10 Essential (primary) hypertension: Secondary | ICD-10-CM | POA: Diagnosis not present

## 2022-09-29 DIAGNOSIS — R410 Disorientation, unspecified: Secondary | ICD-10-CM | POA: Diagnosis not present

## 2022-09-29 DIAGNOSIS — Z7401 Bed confinement status: Secondary | ICD-10-CM | POA: Diagnosis not present

## 2022-09-29 DIAGNOSIS — F32A Depression, unspecified: Secondary | ICD-10-CM | POA: Diagnosis not present

## 2022-09-29 DIAGNOSIS — C50912 Malignant neoplasm of unspecified site of left female breast: Secondary | ICD-10-CM | POA: Diagnosis not present

## 2022-09-29 DIAGNOSIS — K219 Gastro-esophageal reflux disease without esophagitis: Secondary | ICD-10-CM | POA: Diagnosis not present

## 2022-09-29 DIAGNOSIS — C787 Secondary malignant neoplasm of liver and intrahepatic bile duct: Secondary | ICD-10-CM | POA: Diagnosis not present

## 2022-09-29 DIAGNOSIS — K831 Obstruction of bile duct: Secondary | ICD-10-CM | POA: Diagnosis not present

## 2022-09-29 DIAGNOSIS — F319 Bipolar disorder, unspecified: Secondary | ICD-10-CM | POA: Diagnosis not present

## 2022-09-29 DIAGNOSIS — Z170 Estrogen receptor positive status [ER+]: Secondary | ICD-10-CM | POA: Diagnosis not present

## 2022-09-29 DIAGNOSIS — C50211 Malignant neoplasm of upper-inner quadrant of right female breast: Secondary | ICD-10-CM | POA: Diagnosis not present

## 2022-10-01 ENCOUNTER — Ambulatory Visit: Payer: Commercial Managed Care - PPO | Admitting: Hematology and Oncology

## 2022-10-01 ENCOUNTER — Ambulatory Visit: Payer: Commercial Managed Care - PPO

## 2022-10-01 ENCOUNTER — Other Ambulatory Visit: Payer: Commercial Managed Care - PPO

## 2022-10-06 ENCOUNTER — Telehealth: Payer: Self-pay | Admitting: *Deleted

## 2022-10-06 NOTE — Telephone Encounter (Signed)
Patient decided to use continuous FMLA.  Paper work completed by t his nurse to collaborative bin for provider review, signature and return for this nurse to return to [employer].

## 2022-10-07 NOTE — Telephone Encounter (Addendum)
[employer] paperwork for claims [id number] and [phone] received today by this nurse from collaborative signed by provider.  Successfully returned via fax.    Process completed with copy of forms to [employer] bin designated for items to be scanned completes process.  No further instructions received, actions required or performed by this nurse.  Copies mailed to address on file.  [address] [place] [address]

## 2022-10-08 ENCOUNTER — Ambulatory Visit: Payer: Commercial Managed Care - PPO

## 2022-10-08 ENCOUNTER — Other Ambulatory Visit: Payer: Commercial Managed Care - PPO

## 2022-10-10 DEATH — deceased

## 2022-10-21 ENCOUNTER — Other Ambulatory Visit (HOSPITAL_BASED_OUTPATIENT_CLINIC_OR_DEPARTMENT_OTHER): Payer: Self-pay

## 2023-03-31 NOTE — Telephone Encounter (Signed)
Telephone call  

## 2023-07-02 IMAGING — CT CT CHEST-ABD-PELV W/ CM
2 of 5 series · 13 of 36 positions shown, 15 images · IV contrast (APPLIED)
Comparison: 06/24/2021

CLINICAL DATA: Metastatic breast cancer restaging, ongoing
chemotherapy, XRT complete, status post bilateral mastectomy *
Tracking Code: BO *

EXAM:
CT CHEST, ABDOMEN, AND PELVIS WITH CONTRAST
TECHNIQUE: Multidetector CT imaging of the chest, abdomen and pelvis was
performed following the standard protocol during bolus
administration of intravenous contrast.

[Series 2: cap with · axial · 0.82mm/px · z∈[-607,-87]mm · 10 of 128 slices shown, 12 images]
[im 12/128  mediastinal]
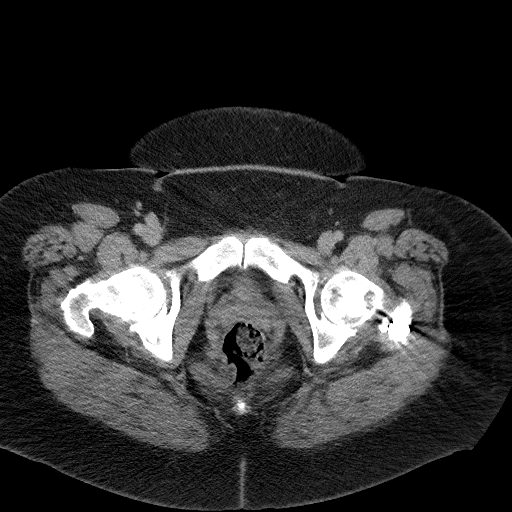
[im 12/128  bone]
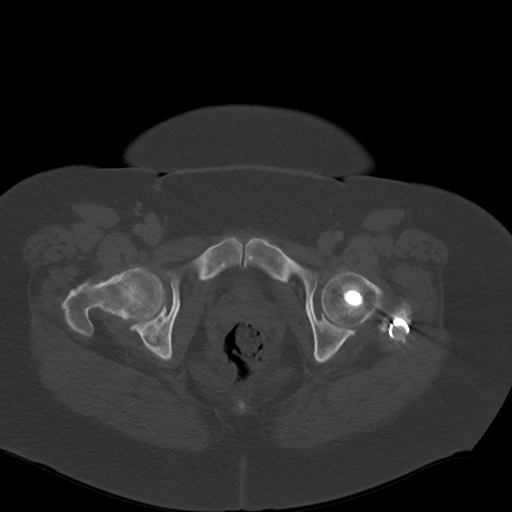
[im 24/128  mediastinal]
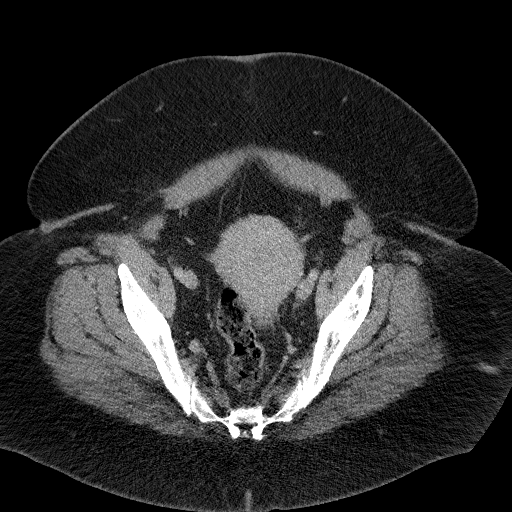
[im 35/128  mediastinal]
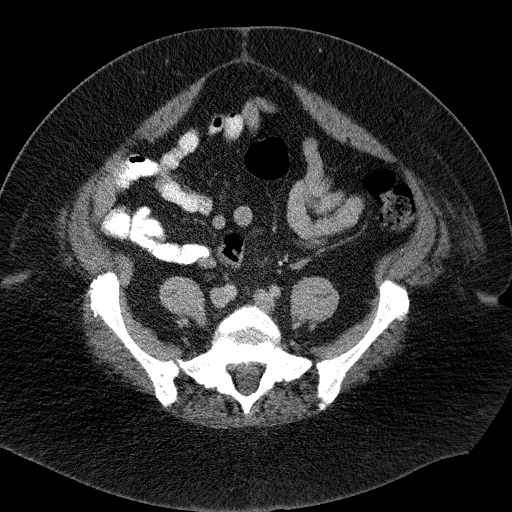
[im 47/128  mediastinal]
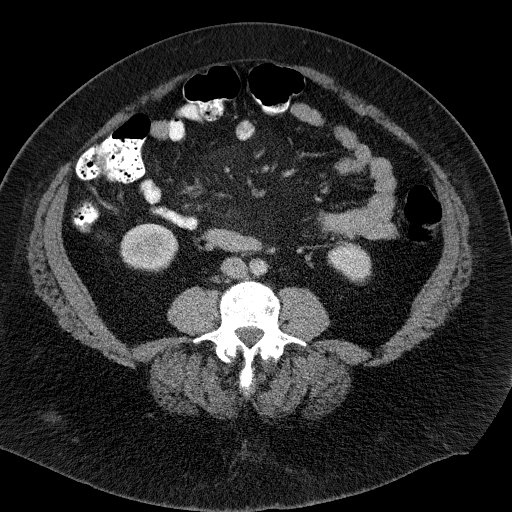
[im 58/128  mediastinal]
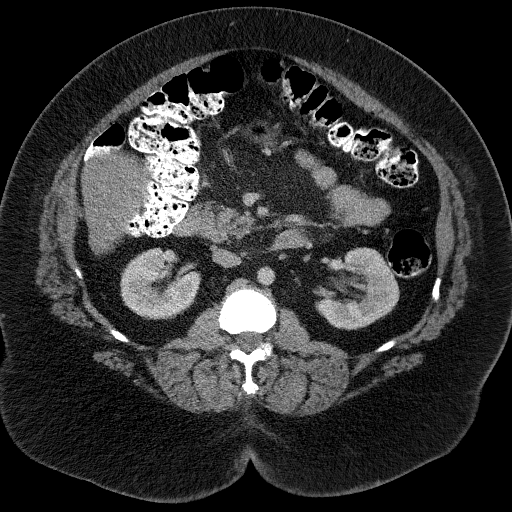
[im 70/128  mediastinal]
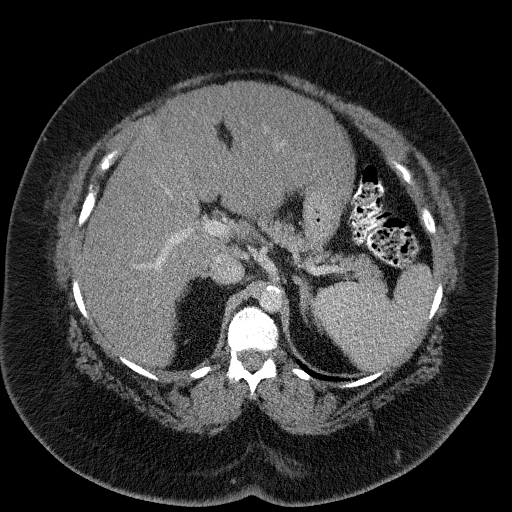
[im 81/128  mediastinal]
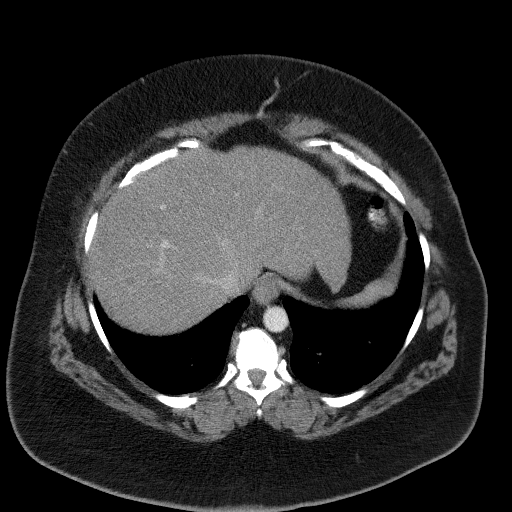
[im 93/128  mediastinal]
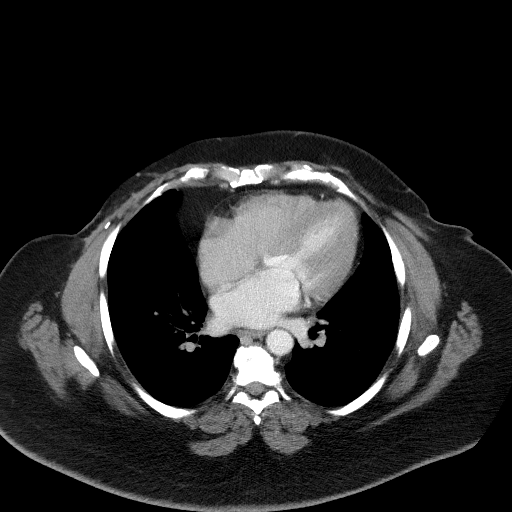
[im 104/128  mediastinal]
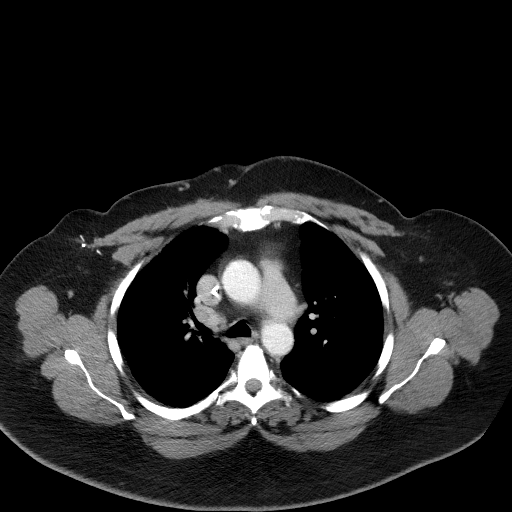
[im 104/128  bone]
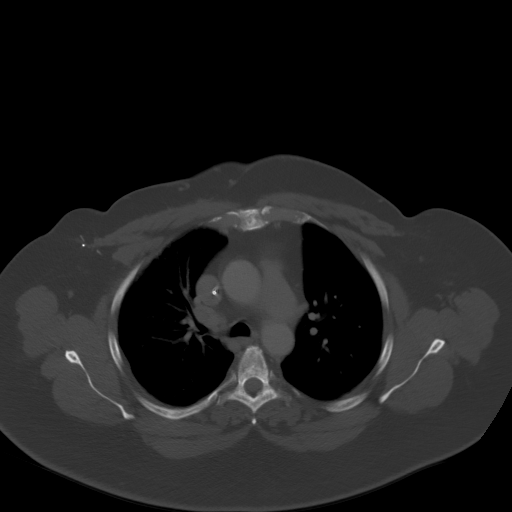
[im 116/128  mediastinal]
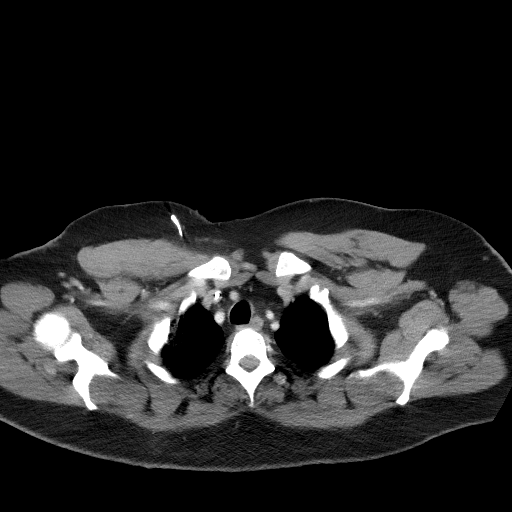

[Series 4: coronals · coronal · 0.91mm/px · 3 of 180 slices shown]
[im 36/180  mediastinal]
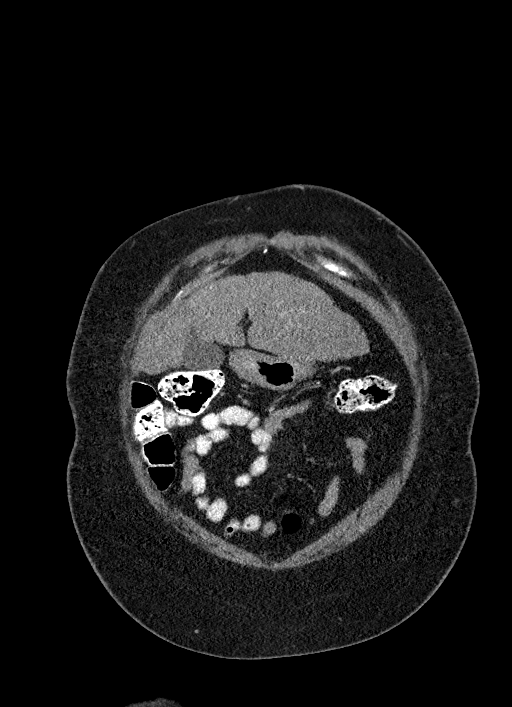
[im 72/180  mediastinal]
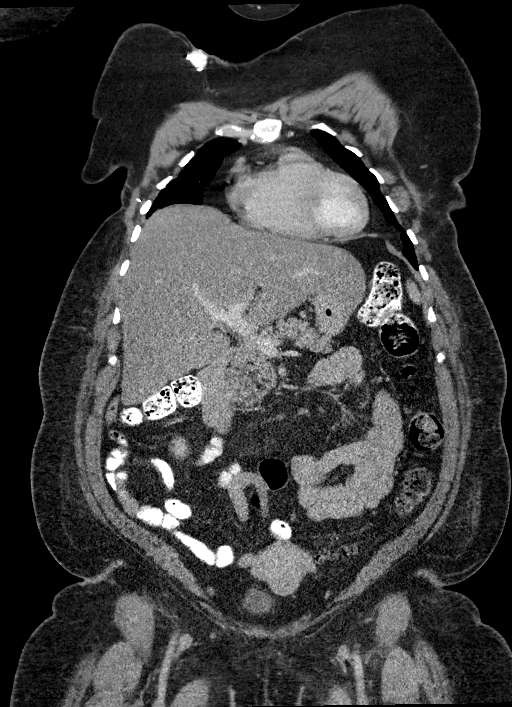
[im 108/180  mediastinal]
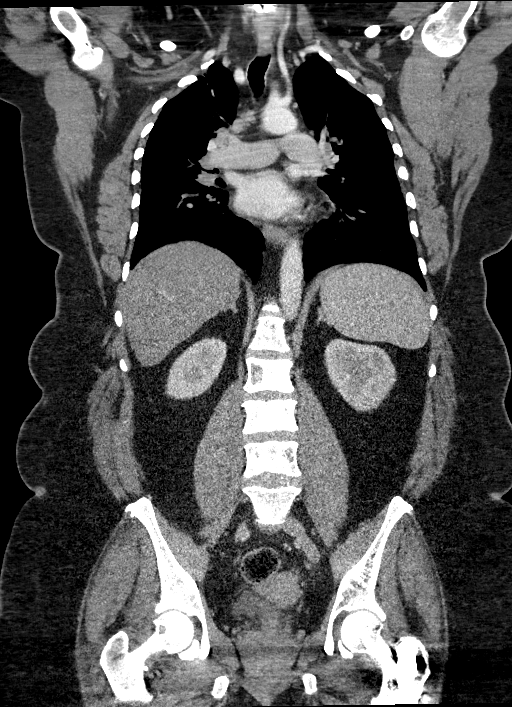

[13 of 36 positions shown; findings below may reference images not displayed]

RADIATION DOSE REDUCTION: This exam was performed according to the
departmental dose-optimization program which includes automated
exposure control, adjustment of the mA and/or kV according to
patient size and/or use of iterative reconstruction technique.

CONTRAST:  100mL OMNIPAQUE IOHEXOL 300 MG/ML SOLN, additional oral
enteric contrast
FINDINGS: CT CHEST FINDINGS

Cardiovascular: Right chest port catheter. Normal heart size. No
pericardial effusion.

Mediastinum/Nodes: Slight interval decrease in size enlarged
pretracheal, subcarinal, and right hilar lymph nodes, index
subcarinal node measuring 2.3 x 1.3 cm, previously 2.5 x 2.2 cm
(series 2, image 31). Thyroid gland, trachea, and esophagus
demonstrate no significant findings.

Lungs/Pleura: Mild subpleural radiation fibrosis of the anterior
right upper lobe and right middle lobes. Numerous small fissural and
pleural nodules throughout the lungs are unchanged, for example a
subpleural nodule of the peripheral posterior left upper lobe
measuring 0.4 cm (series 6, image 82) no pleural effusion or
pneumothorax.

Musculoskeletal: Status post bilateral mastectomy.

CT ABDOMEN PELVIS FINDINGS

Hepatobiliary: Hepatic steatosis. Nodular contour of the liver.
Innumerable hypodense lesions throughout the liver are very poorly
appreciated on this examination, appearance most likely exquisitely
sensitive to exact phase of contrast, however the largest and most
visible lesions are not obviously changed, index lesion of the
posterior liver dome, hepatic segment VII, measuring 1.6 x 1.2 cm
(series 2, image 50). No gallstones, gallbladder wall thickening, or
biliary dilatation.

Pancreas: Unremarkable. No pancreatic ductal dilatation or
surrounding inflammatory changes.

Spleen: Normal in size without significant abnormality.

Adrenals/Urinary Tract: Adrenal glands are unremarkable. Kidneys are
normal, without renal calculi, solid lesion, or hydronephrosis.
Bladder is unremarkable.

Stomach/Bowel: Stomach is within normal limits. Appendix is not
clearly visualized. No evidence of bowel wall thickening,
distention, or inflammatory changes.

Vascular/Lymphatic: No significant vascular findings are present. No
enlarged abdominal or pelvic lymph nodes.

Reproductive: No mass or other abnormality.

Other: No abdominal wall hernia or abnormality. No ascites.

Musculoskeletal: No acute osseous findings. Unchanged, widespread
mixed lytic and sclerotic osseous metastatic disease involving all
elements of the included axial skeleton.
IMPRESSION: 1. Slight interval decrease in size of enlarged pretracheal,
subcarinal, and right hilar lymph nodes, consistent with treatment
response.
2. Numerous small bilateral fissural and pleural nodules throughout
the lungs are unchanged.
3. Innumerable hypodense lesions throughout the liver are very
poorly appreciated on this examination, appearance most likely
exquisitely sensitive to exact phase of contrast, however the
largest and most visible lesions are not obviously changed.
4. Unchanged, widespread mixed lytic and sclerotic osseous
metastatic disease.
5. No evidence of new metastatic disease.
6. Hepatic steatosis and nodular contour of the liver, which may
reflect cirrhotic morphology or alternately pseudocirrhosis in the
setting of treated metastatic disease.
